# Patient Record
Sex: Male | Born: 1965 | Race: Black or African American | Hispanic: No | Smoking: Never smoker
Health system: Southern US, Community
[De-identification: ages and names within clinical notes are randomized; demographics above are authoritative.]

## PROBLEM LIST (undated history)

## (undated) DIAGNOSIS — K760 Fatty (change of) liver, not elsewhere classified: Secondary | ICD-10-CM

## (undated) DIAGNOSIS — J9611 Chronic respiratory failure with hypoxia: Secondary | ICD-10-CM

## (undated) DIAGNOSIS — IMO0001 Reserved for inherently not codable concepts without codable children: Secondary | ICD-10-CM

## (undated) DIAGNOSIS — G629 Polyneuropathy, unspecified: Secondary | ICD-10-CM

## (undated) DIAGNOSIS — Z9989 Dependence on other enabling machines and devices: Secondary | ICD-10-CM

## (undated) DIAGNOSIS — I5032 Chronic diastolic (congestive) heart failure: Secondary | ICD-10-CM

## (undated) DIAGNOSIS — R0902 Hypoxemia: Secondary | ICD-10-CM

## (undated) DIAGNOSIS — R609 Edema, unspecified: Secondary | ICD-10-CM

## (undated) DIAGNOSIS — K219 Gastro-esophageal reflux disease without esophagitis: Secondary | ICD-10-CM

## (undated) DIAGNOSIS — H9190 Unspecified hearing loss, unspecified ear: Secondary | ICD-10-CM

## (undated) DIAGNOSIS — L0390 Cellulitis, unspecified: Secondary | ICD-10-CM

## (undated) DIAGNOSIS — Z6841 Body Mass Index (BMI) 40.0 and over, adult: Secondary | ICD-10-CM

## (undated) DIAGNOSIS — I509 Heart failure, unspecified: Secondary | ICD-10-CM

## (undated) DIAGNOSIS — I10 Essential (primary) hypertension: Secondary | ICD-10-CM

## (undated) DIAGNOSIS — G4733 Obstructive sleep apnea (adult) (pediatric): Secondary | ICD-10-CM

## (undated) DIAGNOSIS — M1990 Unspecified osteoarthritis, unspecified site: Secondary | ICD-10-CM

## (undated) DIAGNOSIS — E119 Type 2 diabetes mellitus without complications: Secondary | ICD-10-CM

## (undated) DIAGNOSIS — I5020 Unspecified systolic (congestive) heart failure: Secondary | ICD-10-CM

## (undated) DIAGNOSIS — Z794 Long term (current) use of insulin: Secondary | ICD-10-CM

## (undated) DIAGNOSIS — I739 Peripheral vascular disease, unspecified: Secondary | ICD-10-CM

## (undated) DIAGNOSIS — R0601 Orthopnea: Secondary | ICD-10-CM

## (undated) HISTORY — DX: Obstructive sleep apnea (adult) (pediatric): G47.33

## (undated) HISTORY — DX: Fatty (change of) liver, not elsewhere classified: K76.0

## (undated) HISTORY — DX: Unspecified osteoarthritis, unspecified site: M19.90

## (undated) HISTORY — PX: EYE SURGERY: SHX253

## (undated) HISTORY — PX: NOSE SURGERY: SHX723

## (undated) HISTORY — DX: Unspecified systolic (congestive) heart failure: I50.20

## (undated) HISTORY — PX: HIP SURGERY: SHX245

## (undated) HISTORY — DX: Cellulitis, unspecified: L03.90

## (undated) HISTORY — DX: Chronic respiratory failure with hypoxia: J96.11

## (undated) HISTORY — DX: Dependence on other enabling machines and devices: Z99.89

## (undated) HISTORY — DX: Essential (primary) hypertension: I10

## (undated) HISTORY — PX: CHOLECYSTECTOMY: SHX55

## (undated) HISTORY — DX: Peripheral vascular disease, unspecified: I73.9

## (undated) HISTORY — DX: Morbid (severe) obesity due to excess calories: E66.01

---

## 2003-12-11 ENCOUNTER — Emergency Department: Payer: Self-pay | Admitting: Emergency Medicine

## 2004-10-21 ENCOUNTER — Emergency Department: Payer: Self-pay | Admitting: Emergency Medicine

## 2004-10-22 ENCOUNTER — Inpatient Hospital Stay: Payer: Self-pay | Admitting: Internal Medicine

## 2005-03-01 ENCOUNTER — Emergency Department: Payer: Self-pay | Admitting: Emergency Medicine

## 2005-09-28 ENCOUNTER — Emergency Department: Payer: Self-pay | Admitting: Internal Medicine

## 2005-10-03 ENCOUNTER — Emergency Department: Payer: Self-pay | Admitting: Internal Medicine

## 2005-10-03 ENCOUNTER — Ambulatory Visit: Payer: Self-pay | Admitting: Emergency Medicine

## 2007-02-01 HISTORY — PX: TOE AMPUTATION: SHX809

## 2007-04-01 ENCOUNTER — Emergency Department: Payer: Self-pay | Admitting: Emergency Medicine

## 2007-04-05 ENCOUNTER — Inpatient Hospital Stay: Payer: Self-pay | Admitting: Podiatry

## 2007-08-01 ENCOUNTER — Other Ambulatory Visit: Payer: Self-pay

## 2007-08-01 ENCOUNTER — Inpatient Hospital Stay: Payer: Self-pay | Admitting: Specialist

## 2007-08-20 ENCOUNTER — Ambulatory Visit: Payer: Self-pay | Admitting: General Surgery

## 2007-08-23 ENCOUNTER — Inpatient Hospital Stay: Payer: Self-pay | Admitting: Surgery

## 2008-02-01 HISTORY — PX: TOE AMPUTATION: SHX809

## 2008-03-11 ENCOUNTER — Inpatient Hospital Stay: Payer: Self-pay | Admitting: Internal Medicine

## 2008-04-17 ENCOUNTER — Ambulatory Visit: Payer: Self-pay | Admitting: Podiatry

## 2008-04-18 ENCOUNTER — Ambulatory Visit: Payer: Self-pay | Admitting: Podiatry

## 2008-11-28 ENCOUNTER — Encounter: Admission: RE | Admit: 2008-11-28 | Discharge: 2008-11-28 | Payer: Self-pay | Admitting: Orthopedic Surgery

## 2009-02-01 ENCOUNTER — Inpatient Hospital Stay: Payer: Self-pay | Admitting: Internal Medicine

## 2009-10-21 ENCOUNTER — Inpatient Hospital Stay: Payer: Self-pay | Admitting: Internal Medicine

## 2010-03-26 ENCOUNTER — Emergency Department: Payer: Self-pay | Admitting: Emergency Medicine

## 2010-10-30 ENCOUNTER — Emergency Department: Payer: Self-pay | Admitting: Emergency Medicine

## 2010-11-05 ENCOUNTER — Ambulatory Visit: Payer: Self-pay | Admitting: Unknown Physician Specialty

## 2011-03-08 ENCOUNTER — Inpatient Hospital Stay: Payer: Self-pay | Admitting: Student

## 2011-03-08 LAB — CBC WITH DIFFERENTIAL/PLATELET
Basophil #: 0 10*3/uL (ref 0.0–0.1)
Basophil %: 0.1 %
Eosinophil #: 0.3 10*3/uL (ref 0.0–0.7)
Eosinophil %: 1.7 %
HCT: 41.3 % (ref 40.0–52.0)
HGB: 13.8 g/dL (ref 13.0–18.0)
Lymphocyte #: 1.6 10*3/uL (ref 1.0–3.6)
Lymphocyte %: 9.3 %
MCH: 27.9 pg (ref 26.0–34.0)
MCHC: 33.3 g/dL (ref 32.0–36.0)
MCV: 84 fL (ref 80–100)
Monocyte #: 0.8 10*3/uL — ABNORMAL HIGH (ref 0.0–0.7)
Monocyte %: 4.7 %
Neutrophil #: 14.7 10*3/uL — ABNORMAL HIGH (ref 1.4–6.5)
Neutrophil %: 84.2 %
Platelet: 317 10*3/uL (ref 150–440)
RBC: 4.94 10*6/uL (ref 4.40–5.90)
RDW: 15.3 % — ABNORMAL HIGH (ref 11.5–14.5)
WBC: 17.4 10*3/uL — ABNORMAL HIGH (ref 3.8–10.6)

## 2011-03-08 LAB — COMPREHENSIVE METABOLIC PANEL
Albumin: 3.9 g/dL (ref 3.4–5.0)
Alkaline Phosphatase: 70 U/L (ref 50–136)
Anion Gap: 11 (ref 7–16)
BUN: 24 mg/dL — ABNORMAL HIGH (ref 7–18)
Bilirubin,Total: 0.5 mg/dL (ref 0.2–1.0)
Calcium, Total: 9.1 mg/dL (ref 8.5–10.1)
Chloride: 104 mmol/L (ref 98–107)
Co2: 29 mmol/L (ref 21–32)
Creatinine: 1.24 mg/dL (ref 0.60–1.30)
EGFR (African American): >60
EGFR (Non-African Amer.): >60
Glucose: 120 mg/dL — ABNORMAL HIGH (ref 65–99)
Osmolality: 292 (ref 275–301)
Potassium: 3.9 mmol/L (ref 3.5–5.1)
SGOT(AST): 19 U/L (ref 15–37)
SGPT (ALT): 29 U/L
Sodium: 144 mmol/L (ref 136–145)
Total Protein: 8.3 g/dL — ABNORMAL HIGH (ref 6.4–8.2)

## 2011-03-09 LAB — BASIC METABOLIC PANEL WITH GFR
Anion Gap: 12 (ref 7–16)
BUN: 25 mg/dL — ABNORMAL HIGH (ref 7–18)
Calcium, Total: 8.3 mg/dL — ABNORMAL LOW (ref 8.5–10.1)
Chloride: 102 mmol/L (ref 98–107)
Co2: 26 mmol/L (ref 21–32)
Creatinine: 1.23 mg/dL (ref 0.60–1.30)
EGFR (African American): >60
EGFR (Non-African Amer.): >60
Glucose: 262 mg/dL — ABNORMAL HIGH (ref 65–99)
Osmolality: 293 (ref 275–301)
Potassium: 4 mmol/L (ref 3.5–5.1)
Sodium: 140 mmol/L (ref 136–145)

## 2011-03-09 LAB — CBC WITH DIFFERENTIAL/PLATELET
Basophil #: 0 10*3/uL (ref 0.0–0.1)
Basophil %: 0.4 %
Eosinophil #: 0.3 10*3/uL (ref 0.0–0.7)
Eosinophil %: 2.5 %
HCT: 35.3 % — ABNORMAL LOW (ref 40.0–52.0)
HGB: 12.3 g/dL — ABNORMAL LOW (ref 13.0–18.0)
Lymphocyte #: 2.3 10*3/uL (ref 1.0–3.6)
Lymphocyte %: 20.4 %
MCH: 29.5 pg (ref 26.0–34.0)
MCHC: 34.7 g/dL (ref 32.0–36.0)
MCV: 85 fL (ref 80–100)
Monocyte #: 1.1 10*3/uL — ABNORMAL HIGH (ref 0.0–0.7)
Monocyte %: 10.2 %
Neutrophil #: 7.4 10*3/uL — ABNORMAL HIGH (ref 1.4–6.5)
Neutrophil %: 66.5 %
Platelet: 276 10*3/uL (ref 150–440)
RBC: 4.16 10*6/uL — ABNORMAL LOW (ref 4.40–5.90)
RDW: 14.2 % (ref 11.5–14.5)
WBC: 11.1 10*3/uL — ABNORMAL HIGH (ref 3.8–10.6)

## 2011-03-09 LAB — HEMOGLOBIN A1C: Hemoglobin A1C: 8.1 % — ABNORMAL HIGH (ref 4.2–6.3)

## 2011-03-09 LAB — VANCOMYCIN, TROUGH: Vancomycin, Trough: 13 ug/mL (ref 10–20)

## 2011-03-11 LAB — CBC WITH DIFFERENTIAL/PLATELET
Basophil #: 0 10*3/uL (ref 0.0–0.1)
Basophil %: 0.3 %
Eosinophil #: 0.4 10*3/uL (ref 0.0–0.7)
Eosinophil %: 4.4 %
HCT: 35.5 % — ABNORMAL LOW (ref 40.0–52.0)
HGB: 11.8 g/dL — ABNORMAL LOW (ref 13.0–18.0)
Lymphocyte #: 1.9 10*3/uL (ref 1.0–3.6)
Lymphocyte %: 20.6 %
MCH: 28.1 pg (ref 26.0–34.0)
MCHC: 33.2 g/dL (ref 32.0–36.0)
MCV: 85 fL (ref 80–100)
Monocyte #: 0.9 10*3/uL — ABNORMAL HIGH (ref 0.0–0.7)
Monocyte %: 9.4 %
Neutrophil #: 6.1 10*3/uL (ref 1.4–6.5)
Neutrophil %: 65.3 %
Platelet: 288 10*3/uL (ref 150–440)
RBC: 4.2 10*6/uL — ABNORMAL LOW (ref 4.40–5.90)
RDW: 15.1 % — ABNORMAL HIGH (ref 11.5–14.5)
WBC: 9.3 10*3/uL (ref 3.8–10.6)

## 2011-03-11 LAB — CREATININE, SERUM
Creatinine: 1.08 mg/dL (ref 0.60–1.30)
EGFR (African American): >60
EGFR (Non-African Amer.): >60

## 2011-03-11 LAB — VANCOMYCIN, TROUGH: Vancomycin, Trough: 19 ug/mL (ref 10–20)

## 2011-03-12 LAB — WOUND CULTURE

## 2011-03-13 LAB — CULTURE, BLOOD (SINGLE)

## 2011-05-02 ENCOUNTER — Ambulatory Visit: Payer: Self-pay | Admitting: Podiatry

## 2011-05-02 LAB — BASIC METABOLIC PANEL
Anion Gap: 10 (ref 7–16)
BUN: 19 mg/dL — ABNORMAL HIGH (ref 7–18)
Calcium, Total: 8.8 mg/dL (ref 8.5–10.1)
Chloride: 104 mmol/L (ref 98–107)
Co2: 28 mmol/L (ref 21–32)
Creatinine: 1.18 mg/dL (ref 0.60–1.30)
EGFR (African American): >60
EGFR (Non-African Amer.): >60
Glucose: 179 mg/dL — ABNORMAL HIGH (ref 65–99)
Osmolality: 290 (ref 275–301)
Potassium: 4.2 mmol/L (ref 3.5–5.1)
Sodium: 142 mmol/L (ref 136–145)

## 2011-05-02 LAB — CBC WITH DIFFERENTIAL/PLATELET
Basophil #: 0 10*3/uL (ref 0.0–0.1)
Basophil %: 0.4 %
Eosinophil #: 0.3 10*3/uL (ref 0.0–0.7)
Eosinophil %: 3.5 %
HCT: 38 % — ABNORMAL LOW (ref 40.0–52.0)
HGB: 12.6 g/dL — ABNORMAL LOW (ref 13.0–18.0)
Lymphocyte #: 2.1 10*3/uL (ref 1.0–3.6)
Lymphocyte %: 22.2 %
MCH: 28.2 pg (ref 26.0–34.0)
MCHC: 33.1 g/dL (ref 32.0–36.0)
MCV: 85 fL (ref 80–100)
Monocyte #: 0.6 10*3/uL (ref 0.0–0.7)
Monocyte %: 6.9 %
Neutrophil #: 6.2 10*3/uL (ref 1.4–6.5)
Neutrophil %: 67 %
Platelet: 315 10*3/uL (ref 150–440)
RBC: 4.45 10*6/uL (ref 4.40–5.90)
RDW: 15.1 % — ABNORMAL HIGH (ref 11.5–14.5)
WBC: 9.3 10*3/uL (ref 3.8–10.6)

## 2011-05-06 ENCOUNTER — Ambulatory Visit: Payer: Self-pay | Admitting: Podiatry

## 2011-05-09 LAB — PATHOLOGY REPORT

## 2011-05-12 ENCOUNTER — Ambulatory Visit: Payer: Self-pay | Admitting: Family Medicine

## 2011-05-20 ENCOUNTER — Other Ambulatory Visit: Payer: Self-pay | Admitting: Podiatry

## 2011-05-24 LAB — WOUND CULTURE

## 2011-10-10 ENCOUNTER — Ambulatory Visit: Payer: Self-pay | Admitting: Unknown Physician Specialty

## 2011-10-10 LAB — CREATININE, SERUM
Creatinine: 1.14 mg/dL (ref 0.60–1.30)
EGFR (African American): >60
EGFR (Non-African Amer.): >60

## 2011-10-20 ENCOUNTER — Ambulatory Visit: Payer: Self-pay | Admitting: Unknown Physician Specialty

## 2011-10-26 ENCOUNTER — Ambulatory Visit: Payer: Self-pay | Admitting: Unknown Physician Specialty

## 2013-06-24 ENCOUNTER — Emergency Department: Payer: Self-pay | Admitting: Emergency Medicine

## 2013-06-24 IMAGING — CT CT PARANASAL SINUSES W/O CM
1 series · 15 of 30 positions shown, 19 images · non-contrast
Comparison: none

REASON FOR EXAM: Abn MRI Rt Frontal Sinus Bone
COMMENTS:

[Series 2: osteo · axial · 0.40mm/px · z∈[-188,+4]mm · 15 of 208 slices shown, 19 images]
[im 8/208  brain]
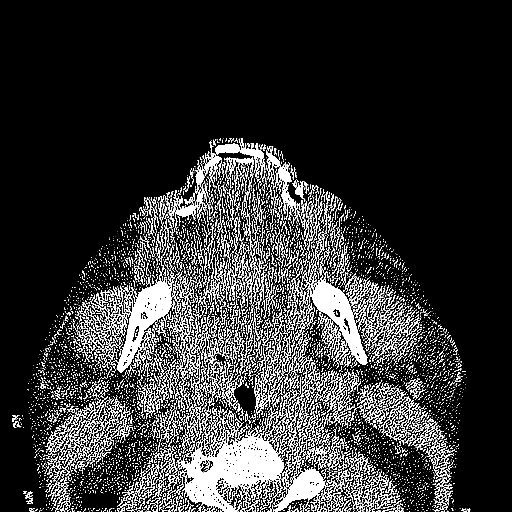
[im 8/208  bone]
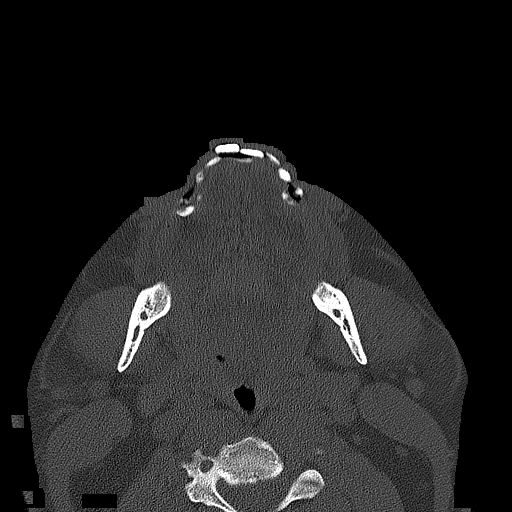
[im 22/208  bone]
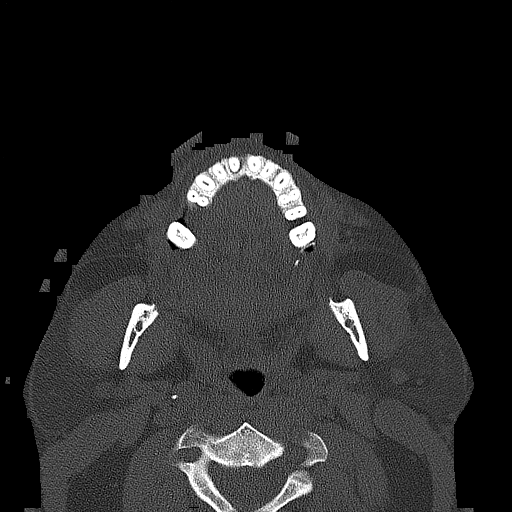
[im 36/208  bone]
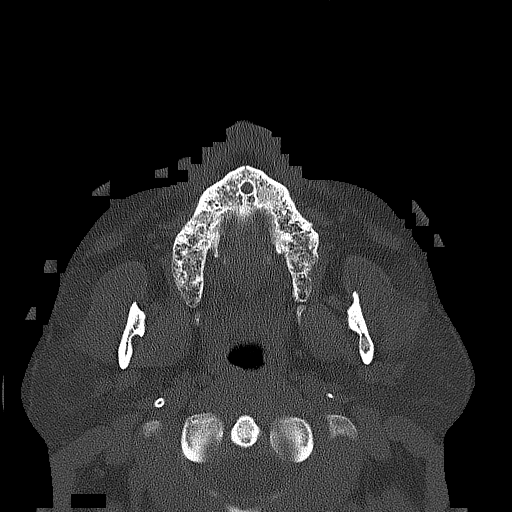
[im 50/208  bone]
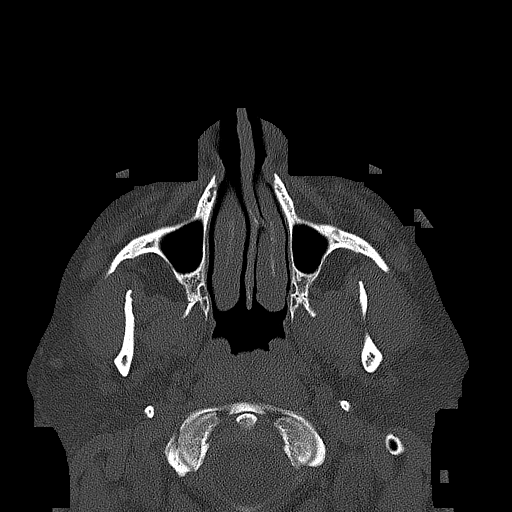
[im 65/208  brain]
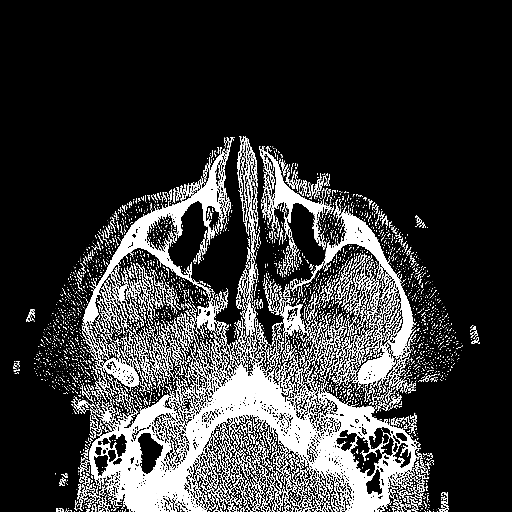
[im 65/208  bone]
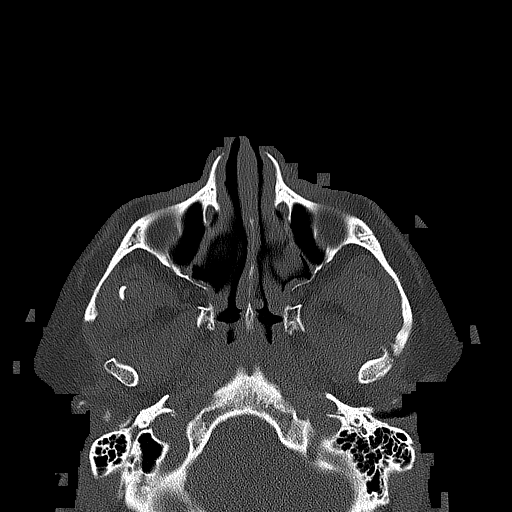
[im 79/208  bone]
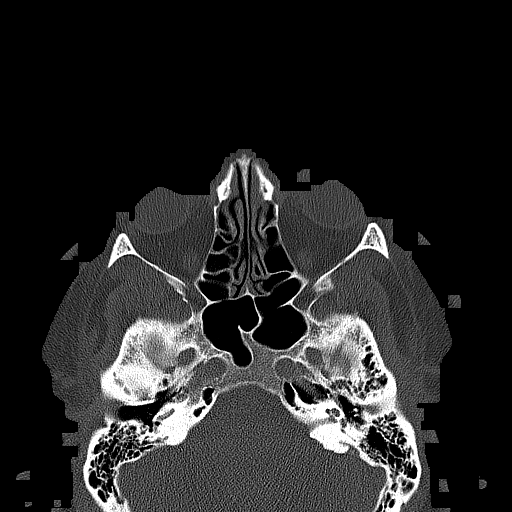
[im 93/208  bone]
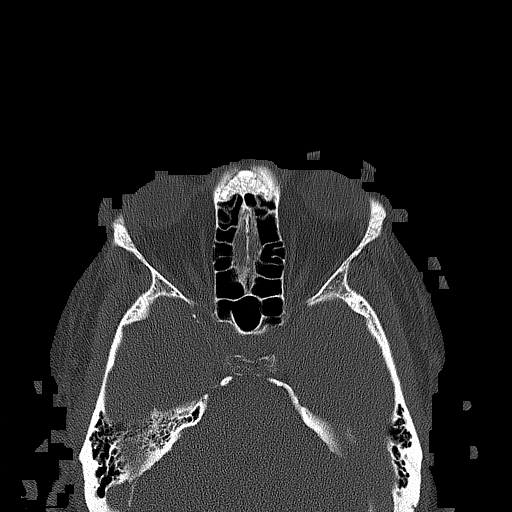
[im 108/208  bone]
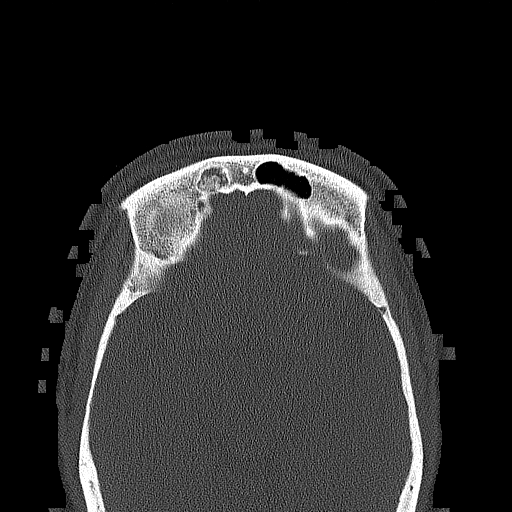
[im 115/208  brain]
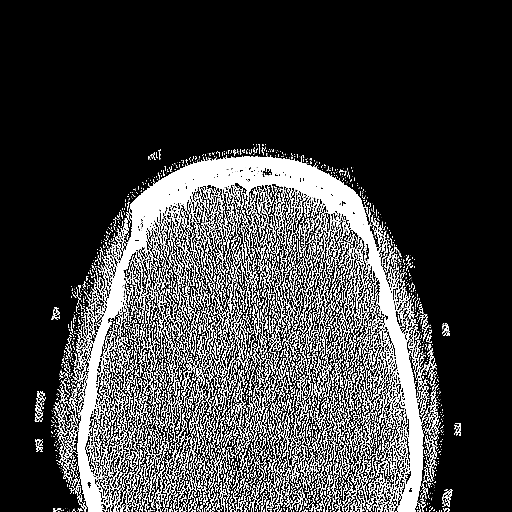
[im 115/208  bone]
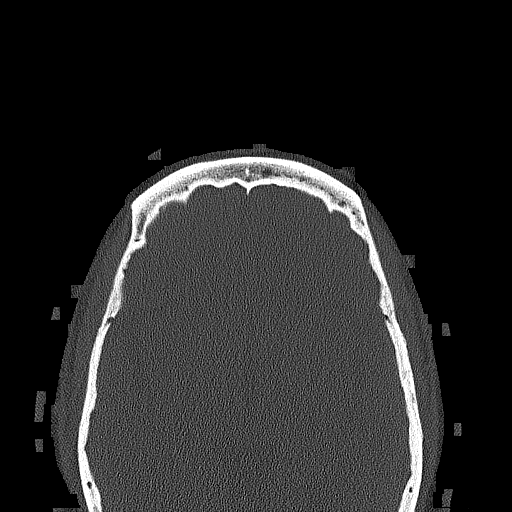
[im 129/208  bone]
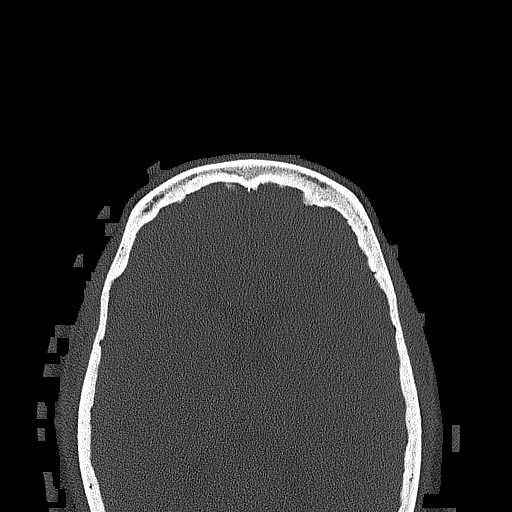
[im 143/208  bone]
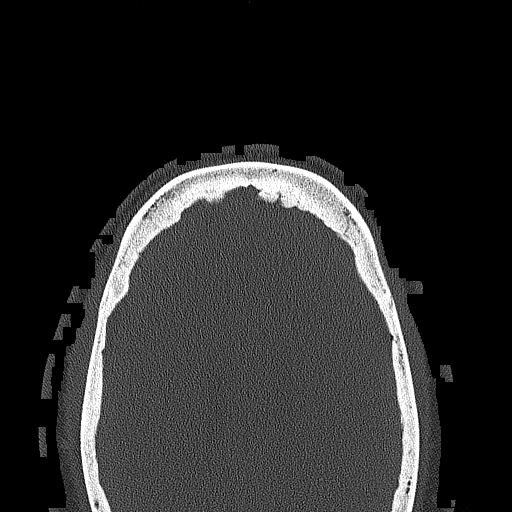
[im 158/208  bone]
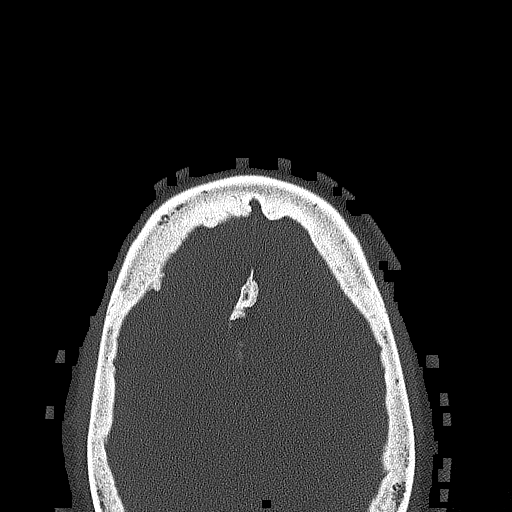
[im 172/208  brain]
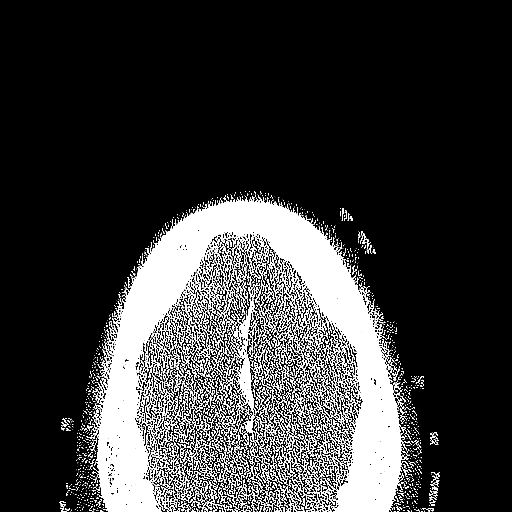
[im 172/208  bone]
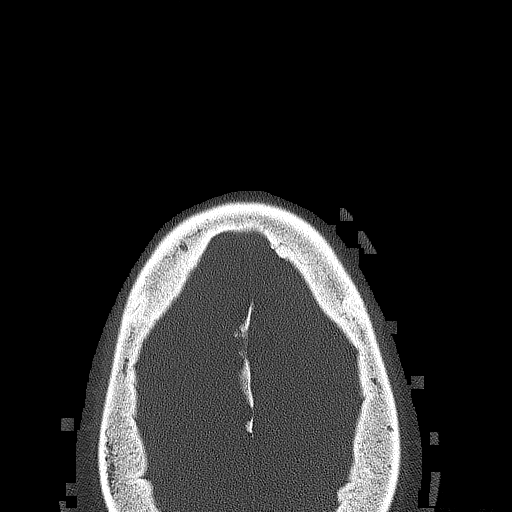
[im 186/208  bone]
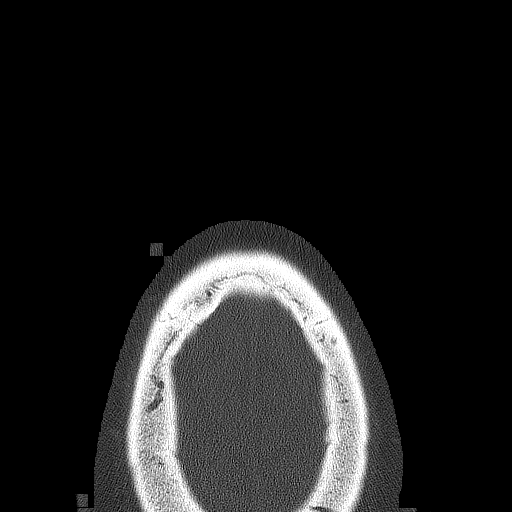
[im 200/208  bone]
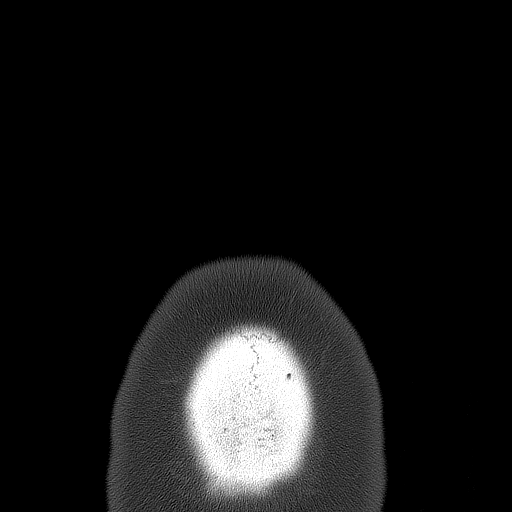

[15 of 30 positions shown; findings below may reference images not displayed]

PROCEDURE:     KCT - KCT SINUSES WITHOUT CONTRAST  - October 26, 2011 [DATE]

RESULT:     Sinus CT is reconstructed at bone window settings at 2.0 mm
slice thickness in the axial and coronal planes. Comparison is made to the
previous MRI from 20 October, 2011.

There is an abnormal appearance in the right frontal sinus serpiginous
calcific density and opacification. There is thinning and possible
disruption of the posterior right frontal sinus cortex into the CSF space.
The appearance of the sinus raises the possibility of chronic sinusitis and
inspissated mucus. Correlate clinically. Surgical consultation is
recommended. There is a normally aerated left frontal sinus. The ethmoid
sinuses appear clear bilaterally. The sphenoid and maxillary sinuses show no
opacification or mucosal thickening. The nasal septum approximates midline.
The included mastoids sinuses and middle ear structures appear to be grossly
normal. Calcification is seen along the interhemispheric fissure. Zygomatic
arches, included mandible and maxilla are otherwise unremarkable.
IMPRESSION: 1. Findings concerning for chronic sinusitis with inspissated mucus in the
right frontal sinus with possible erosion of the posterior aspect of the
right frontal sinus wall into the CSF space. This is best appreciated in the
region of image #108 to #110.

[REDACTED]

## 2013-07-09 DIAGNOSIS — E785 Hyperlipidemia, unspecified: Secondary | ICD-10-CM | POA: Insufficient documentation

## 2013-10-15 ENCOUNTER — Ambulatory Visit: Payer: Self-pay | Admitting: Podiatry

## 2013-11-05 ENCOUNTER — Encounter: Payer: Self-pay | Admitting: Podiatry

## 2013-11-05 ENCOUNTER — Ambulatory Visit (INDEPENDENT_AMBULATORY_CARE_PROVIDER_SITE_OTHER): Payer: Medicare PPO | Admitting: Podiatry

## 2013-11-05 ENCOUNTER — Ambulatory Visit: Payer: Self-pay | Admitting: Podiatry

## 2013-11-05 VITALS — BP 127/70 | HR 76 | Resp 16 | Ht 74.0 in | Wt 354.0 lb

## 2013-11-05 DIAGNOSIS — Q828 Other specified congenital malformations of skin: Secondary | ICD-10-CM

## 2013-11-05 DIAGNOSIS — B351 Tinea unguium: Secondary | ICD-10-CM

## 2013-11-05 DIAGNOSIS — M79676 Pain in unspecified toe(s): Secondary | ICD-10-CM

## 2013-11-05 DIAGNOSIS — E118 Type 2 diabetes mellitus with unspecified complications: Secondary | ICD-10-CM

## 2013-11-05 NOTE — Progress Notes (Signed)
   Subjective:    [name], male    DOB: Jun 03, 1965, 48 y.o.   MRN: [id number]  HPI Comments: Raymond [name] presents to the office today for diabetic risk assessment and painful elongated nails. He states he is been diabetic for approximately 20 years. 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 States his feet are numb chronically. Denies any claudication symptoms at this time. Denies any redness or drainage from the nail sites. No other complaints at this time. Patient was previously seen Dr. Cleda Mccreedy at [facility]. Patient is unsure of his blood sugar regularly.     Review of Systems  HENT: Positive for hearing loss, sinus pressure and sore throat.   Eyes: Positive for redness and visual disturbance.  Endocrine: Positive for heat intolerance.  Musculoskeletal:       Difficulty walking  Neurological: Positive for numbness.  All other systems reviewed and are negative.      Objective:   Physical Exam AAO x3, NAD DP pulses palpable, PT pulses decreased. CRT < 3 sec Absent protective sensation, altered vibratory sensation. Nails hypertrophic, dystrophic, elongated, brittle, yellow-brown discoloration x7 Thick hyperkeratotic lesion at the medial aspect of the left first metatarsal head with evidence of underlying previous bleeding. Upon debridement there is a small superficial pinpoint ulceration without any probing, undermining, tunneling. There is no drainage from the site. No clinical signs of infection. Previous bilateral hallux indications and left second digit indication. Interdigital maceration right foot MMT 5/5, ROM WNL No calf pain, swelling, warmth.     Assessment & Plan:  48 year old male with symptomatic onychomycosis, thick hyperkeratotic lesion left medial first metatarsal head with superficial underlying opening -There is treatment options discussed the patient including alternatives, risks,  complications. -Nail sharply debrided x7 without complications -Hyperkeratotic lesion sharply debrided without consultation. Underlying superficial, pinpoint ulceration or clinical signs of infection. Continued antibiotic ointment and a Band-Aid over the site. -Discussed the importance of daily foot inspection. -Followup in 3 months or sooner if any problems are to arise. Monitor the left foot site. If there is no healing in the next 2 weeks or any clinical signs of infection and directed to call the office sooner. Call the office with any questions, concerns, change in symptoms.

## 2014-02-04 ENCOUNTER — Ambulatory Visit: Payer: Medicare PPO | Admitting: Podiatry

## 2014-03-30 ENCOUNTER — Inpatient Hospital Stay: Payer: Self-pay | Admitting: Internal Medicine

## 2014-04-22 ENCOUNTER — Ambulatory Visit: Payer: Self-pay | Admitting: Family

## 2014-04-28 ENCOUNTER — Encounter: Payer: Self-pay | Admitting: Internal Medicine

## 2014-04-28 ENCOUNTER — Ambulatory Visit (INDEPENDENT_AMBULATORY_CARE_PROVIDER_SITE_OTHER): Payer: Medicare PPO | Admitting: Internal Medicine

## 2014-04-28 VITALS — BP 130/72 | HR 74 | Temp 97.8°F | Ht 74.0 in | Wt 370.0 lb

## 2014-04-28 DIAGNOSIS — R0902 Hypoxemia: Secondary | ICD-10-CM

## 2014-04-28 DIAGNOSIS — G4733 Obstructive sleep apnea (adult) (pediatric): Secondary | ICD-10-CM

## 2014-04-28 DIAGNOSIS — R0600 Dyspnea, unspecified: Secondary | ICD-10-CM | POA: Diagnosis not present

## 2014-04-28 DIAGNOSIS — Z9981 Dependence on supplemental oxygen: Secondary | ICD-10-CM

## 2014-04-28 DIAGNOSIS — G479 Sleep disorder, unspecified: Secondary | ICD-10-CM | POA: Diagnosis not present

## 2014-04-28 NOTE — Progress Notes (Signed)
Date: 04/28/2014  MRN# [id number] Raymond [name] 08-21-1965  Referring Physician: Saunders Revel, MD [facility])  Raymond [name] is a 49 y.o. old male seen in consultation for hospital follow up OSA possible chronic respiratory failure secondary to heart failure (diastolic)  CC:  Chief Complaint  Patient presents with  . Hospitalization Follow-up    Pt is here f/u fluid on lungs. Pt came home on 2l 02 CONT. Pt states he is doing well no sob. Pt denies chest tightness, cough or wheezing.    HPI:  Patient is a pleasant 49 year old male presents today for hospital followup visit of chronic respiratory failure secondary to obesity, obstructive sleep apnea, systolic CHF. Patient's past medical history of morbid obesity, diabetes, hypertension, systolic CHF (ejection fraction 50-55%). For full hospital admission details see below. Briefly, patient had one week of shortness of breath and presented to the hospital, noted to have desaturations in the mid 80s, was placed on BiPAP with moderate improvement, and discharged on a trilogy machine. Patient states that he has a diagnosis of sleep apnea since 2010, was initially on CPAP, and 2013 had arepeat sleep study, was placed on BiPAP 27-21, but has not been wearing his BiPAP machine on a nightly basis (maybe one time a). During hospitalization he was suspected to have untreated sleep apnea along with obesity hypoventilation syndrome.  Today he presents for followup of sleep apnea and optimization of his sleep apnea along with his chronic hypoxic respiratory failure. During his visit today on room air he was noted to be satting anywhere between 88 and 91%. He recently followed up with the CHF clinic and is currently on twice a day diuretics.  However, Per review of his medication list he is currently on furosemide 40 mg daily Patient states that he does have occasional swelling, he is able to walk but does have to wear 2 L of oxygen, he has not  try climbing stairs recently, but thinks that he would become severely short of breath if he does try to climb stairs.   [facility] DATE OF ADMISSION:  03/30/2014 DATE OF DISCHARGE:  04/08/2014  PRIMARY CARE PHYSICIAN: Myrle Sheng. Jimmye Norman, MD at the [facility].  FINAL DIAGNOSES:  1.  Acute and now chronic respiratory failure, hypoxia and hypercarbic.  2.  Suspected obesity hypoventilation syndrome, possible chronic obstructive pulmonary with hypercarbia sleep apnea.  3.  Acute diastolic congestive heart failure.  4.  Hypertension.  5.  Morbid obesity.  6.  Diabetes.  7.  Gastroesophageal reflux disease without esophagitis.   MEDICATIONS ON DISCHARGE: Include gabapentin 300 mg 3 times a day, aspirin 81 mg daily, Coreg 6.25 mg twice a day, Protonix 40 mg daily, Humalog 60 units 3 times a day, Levemir 70 units subcutaneous injection twice a day, enalapril decreased to 2.5 mg daily, furosemide increased to 40 mg twice a day, potassium chloride 20 mEq daily. Stop taking Glucophage.  HOME OXYGEN: Yes, 2 liters nasal cannula with Trilogy machine at night if that gets approved. If it does not get approved then the BiPAP at night.   DIET: Low sodium diet, carbohydrate-controlled diet, regular consistency.   ACTIVITY: As tolerated.   FOLLOWUP: With Dr. Stevenson Clinch in 3-4 weeks; CHF Clinic in 1-2 weeks with Dr. Tomasita Morrow.   HOSPITAL COURSE: The patient was admitted 03/30/2014, discharged 04/08/2014, came in with shortness of breath for 1 week, admitted with acute respiratory failure, hypoxia, acute congestive heart failure. The patient was given IV Lasix.   Laboratory  and radiological data during the hospital course included an EKG that showed normal sinus rhythm, cannot rule out inferior infarct. Urinalysis 1+ blood, greater than 500 mg/dL of glucose. Chest x-ray showed bibasilar atelectasis, mild vascular congestion without pulmonary edema or pleural effusion. BNP 1301, D-dimer 1006.  White blood cell count 10.8, hemoglobin 14.5, hematocrit 46.8, platelet count of 270,000. Glucose 254, BUN 13, creatinine 1.12, sodium 143, potassium 4.3, chloride 101, CO2 of 37, calcium 8.8.   CT scan of the chest to rule out pulmonary embolism showed no evidence of significant pulmonary embolism, patchy bilateral airspace opacities likely reflect atelectasis, minimal interstitial prominence of uncertain significance would correlate for evidence of mild interstitial edema. TSH of 0.73. Hemoglobin A1c of 9.6. LDL 23, HDL 49, triglycerides 62. Echocardiogram showed EF of 50% to 55%, elevated mean left atrial pressure, impaired relaxation of left ventricular diastolic filling, severely dilated left atrium, mild mitral valve regurgitation, moderately increased left ventricular posterior wall thickness. Ultrasound of the lower extremities, no evidence of DVT.   Creatinine upon discharge 1.09, sodium 141, potassium 3.9, chloride 102, CO2 of 28, calcium 8.6. The last ABG on March 4 showed a pH of 7.27, pCO2 of 58, pO2 of 96. That was on 100% high flow nasal cannula. Bicarbonate 26.6, oxygen saturation 96.1.   HOSPITAL COURSE PER PROBLEM LIST:  1.  For the patient's acute now chronic respiratory failure hypoxia and hypercarbic, the patient was on 100% high flow nasal cannula for numerous days. In speaking with Dr. Mortimer Fries we just took him off since he was not complaining of anything and he did well once coming off the high flow nasal cannula. He did qualify for home oxygen, when he walked around his pulse oximetry did drop down to 83, so he was discharged home on 2 liters nasal cannula. I think this was mostly CHF but combination of obesity-hypoventilation syndrome, possible COPD with hypercarbia and sleep apnea contributing.  2.  Suspected obesity-hypoventilation syndrome. Possible COPD with hypercarbia and sleep apnea. We did try to get a Trilogy approved. If this gets approved, he will be using the Trilogy at  night. If it does not get approved, he will go back on his BiPAP.  3.  Acute diastolic congestive heart failure. The patient was diuresed aggressively with IV Lasix and also IV Diamox. The patient's CO2 did come down. The patient did respond with good diuresis. The patient's weight upon discharge is 366 with a BMI of 47.0. The patient was urinating well. I did have to hold the Lasix at one point where his creatinine went up to 1.66 and had a Lasix holiday. I put him back on oral Lasix. Creatinine remained stable.  4.  Hypertension. Blood pressure on the lower side. I cut back on his enalapril. Continue the Coreg dose.  5.  Morbid obesity with a BMI of 47. Weight loss is recommended. I did speak to him about gastric banding procedure. If he would want to be a candidate for that, he can follow up with his primary care physician and consider bariatric surgery referral as outpatient.  6.  Diabetes. The patient was seen in consultation by Dr. Gabriel Carina. When the patient was on steroids, the sugars were very high requiring insulin drip in the ICU, now back on his usual medications.  7.  Gastroesophageal reflux disease without esophagitis, back on Protonix.  PMHX:   Past Medical History  Diagnosis Date  . Diabetes   . Hypertension   . Morbid obesity   .  Systolic heart failure     Preserved EF 50-55%  . Chronic respiratory failure with hypoxia     2 L [place] continuously   Surgical Hx:  Past Surgical History  Procedure Laterality Date  . Eye surgery Left     bilat laser  . 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 Toe amputation Right 2009  . Cholecystectomy    . Hip surgery Right     8 th grade pins   Family Hx:  Family History  Problem Relation Age of Onset  . Hypertension Mother   . Stroke Father   . Hypertension Father   . Diabetes Father    Social Hx:   History  Substance Use Topics  . Smoking status: Never Smoker   . Smokeless tobacco: Not on file  . Alcohol Use: Yes   Medication:    Current Outpatient Rx  Name  Route  Sig  Dispense  Refill  . aspirin EC 81 MG tablet   Oral   Take 81 mg by mouth daily.         . carvedilol (COREG) 12.5 MG tablet   Oral   Take 12.5 mg by mouth 2 (two) times daily with a meal.         . enalapril (VASOTEC) 20 MG tablet   Oral   Take 40 mg by mouth 2 (two) times daily.          . furosemide (LASIX) 40 MG tablet   Oral   Take 40 mg by mouth daily.         Marland Kitchen gabapentin (NEURONTIN) 300 MG capsule   Oral   Take 300 mg by mouth 3 (three) times daily.         . metFORMIN (GLUCOPHAGE) 1000 MG tablet   Oral   Take 1,000 mg by mouth 2 (two) times daily with a meal.         . pantoprazole (PROTONIX) 40 MG tablet   Oral   Take 40 mg by mouth daily.         . potassium chloride (MICRO-K) 10 MEQ CR capsule   Oral   Take 10 mEq by mouth daily.             Allergies:  Review of patient's allergies indicates no known allergies.  Review of Systems: Gen:  Denies  fever, sweats, chills HEENT: Denies blurred vision, double vision, ear pain, eye pain, hearing loss, nose bleeds, sore throat Cvc:  No dizziness, chest pain or heaviness Resp:   Short of breath with exertion, no cough, no wheeze. Gi: Denies swallowing difficulty, stomach pain, nausea or vomiting, diarrhea, constipation, bowel incontinence Gu:  Denies bladder incontinence, burning urine Ext:   No Joint pain, stiffness or swelling Skin: No skin rash, easy bruising or bleeding or hives Endoc:  No polyuria, polydipsia , polyphagia or weight change Psych: No depression, insomnia or hallucinations  Other:  All other systems negative  Physical Examination:   VS: BP 130/72 mmHg  Pulse 74  Temp(Src) 97.8 F (36.6 C) (Oral)  Ht 6\' 2"  (1.88 m)  Wt 370 lb (167.831 kg)  BMI 47.49 kg/m2  SpO2 91%  General Appearance: No distress  Neuro:without focal findings, mental status, speech normal, alert and oriented, cranial nerves 2-12 intact, reflexes normal and  symmetric, sensation grossly normal  HEENT: PERRLA, EOM intact, no ptosis, no other lesions noticed; Mallampati 4 Pulmonary: dec breath sounds at the bases, no wheezes\crackles CardiovascularNormal S1,S2.  No m/r/g.  Abdominal aorta pulsation normal.    Abdomen: Benign, Soft, non-tender, No masses, hepatosplenomegaly, No lymphadenopathy Renal:  No costovertebral tenderness  GU:  No performed at this time. Endoc: No evident thyromegaly, no signs of acromegaly or Cushing features Skin:   warm, no rashes, no ecchymosis  Extremities: normal, no cyanosis, clubbing,warm with normal capillary refill. Other findings:+1 edema to mid legs    Rad results: (The following images and results were reviewed by Dr. Stevenson Clinch). 03/30/14 COMPARISON:  Chest radiograph performed earlier today at 6:23 p.m., and CTA of the chest performed 10/21/2009  FINDINGS: There is no evidence of significant pulmonary embolus. Evaluation for pulmonary embolus is mildly suboptimal due to motion artifact and the patient's habitus.  Patchy bilateral airspace opacities likely reflect atelectasis. There is mild interstitial prominence, of uncertain significance. Would correlate for evidence of mild interstitial edema. There is no evidence of pleural effusion or pneumothorax. No masses are identified; no abnormal focal contrast enhancement is seen.  The mediastinum is unremarkable in appearance. No mediastinal lymphadenopathy is seen. No pericardial effusion is identified. The great vessels are grossly unremarkable. No axillary lymphadenopathy is seen. The visualized portions of the thyroid gland are unremarkable in appearance.  The visualized portions of the liver and spleen are unremarkable.  No acute osseous abnormalities are seen. Review of the MIP images confirms the above findings.   IMPRESSION: 1. No evidence of significant pulmonary embolus. 2. Patchy bilateral airspace opacities likely reflect atelectasis. Mild  interstitial prominence, of uncertain significance. Would correlate for evidence of mild interstitial edema.  ECHO 03/2014 Summary:  1. Left ventricular ejection fraction, by visual estimation, is 50 to  55%.  2. Mid inferoseptal segment is abnormal.  3. Elevated mean left atrial pressure.  4. Impaired relaxation pattern of LV diastolic filling.  5. Normal right ventricular size and systolic function.  6. Severely dilated left atrium.  7. The right atrium is normal in size and structure.  8. Mild mitral valve regurgitation.  9. Mild aortic valve sclerosis without stenosis. 10. Moderately increased left ventricular posterior wall thickness.    Sleep study 05/12/2011: AHI of 1234, RDI of 134, BiPAP titrated to 27/21 cm of water  Assessment and Plan: 48 year old male with past medical history of morbid obesity, obstructive sleep apnea on BiPAP, systolic heart failure (preserved EF), chronic hypoxic respiratory failure secondary to heart failure/OSA/OHS, seen in clinic today for hospital follow-up of OSA/OHS optimization. Dyspnea Multifactorial: Obstructive sleep apnea, obesity, untreated OSA, systolic heart failure  Optimization of his heart failure anasarca sleep apnea will greatly improve his level of dyspnea. Weight loss, healthy diet, and exercise, will address deconditioning issues that could also be adding to his dyspnea.  Plan: -Pulmonary function testing, 6 minute walk test at next visit - healthy diet, weight loss, exercise as tolerated. -CHF optimization-currently following the CHF clinic, plan for Cardiology follow up   OSA treated with BiPAP Review sleep on BiPAP 27/21 cm of water. Upon discharge from hospital and March 2015 he was placed on trilogy machine (NIPPV) He has been noncompliant with his BiPAP machine since 2013, will further evaluate with a new split night study to determine if he needs adjustment of his pressures to OSA optimization.   Plan: -Continue  using trilogy machine as directed, on a nightly basis -Split-night study prior to next visit -Weight loss, exercise as tolerated.   Hypoxemia requiring supplemental oxygen Multifactorial: Systolic CHF, obesity, obstructive sleep apnea  During his visit today he was noted to drop  down to 80% on room air, he is recently out of his hospitalization. Will plan for another 6 minute walk test at his followup visit.   Plan: -Pulmonary function testing and 6 minute walk test prior to next visit -Continue at 2 L [place] continuously -Split-night study.   Morbid obesity OBESITY  Wt: 370 lbs BMI: 47.49 Discussed importance of weight reduction.  Educated regarding limitation of  intake of greasy/fried foods.  Instructed on benefit of  a low-impact exercise program, starting slowly.  Discussed benefits of 30-45 minutes of some form of exercise daily as well as benefit of supervised exercise program.        Updated Medication List Outpatient Encounter Prescriptions as of 04/28/2014  Medication Sig  . aspirin EC 81 MG tablet Take 81 mg by mouth daily.  . carvedilol (COREG) 12.5 MG tablet Take 12.5 mg by mouth 2 (two) times daily with a meal.  . enalapril (VASOTEC) 20 MG tablet Take 40 mg by mouth 2 (two) times daily.   . furosemide (LASIX) 40 MG tablet Take 40 mg by mouth daily.  Marland Kitchen gabapentin (NEURONTIN) 300 MG capsule Take 300 mg by mouth 3 (three) times daily.  . metFORMIN (GLUCOPHAGE) 1000 MG tablet Take 1,000 mg by mouth 2 (two) times daily with a meal.  . pantoprazole (PROTONIX) 40 MG tablet Take 40 mg by mouth daily.  . potassium chloride (MICRO-K) 10 MEQ CR capsule Take 10 mEq by mouth daily.  . [DISCONTINUED] POTASSIUM PO Take by mouth daily.    Orders for this visit: No orders of the defined types were placed in this encounter.     Thank  you for the consultation and for allowing [facility] to assist in the care of your patient. Our recommendations are noted  above.  Please contact us if we can be of further service.   Vilinda Boehringer, MD [facility] Office Number: [phone]

## 2014-04-28 NOTE — Assessment & Plan Note (Addendum)
OBESITY  Wt: 370 lbs BMI: 47.49 Discussed importance of weight reduction.  Educated regarding limitation of  intake of greasy/fried foods.  Instructed on benefit of  a low-impact exercise program, starting slowly.  Discussed benefits of 30-45 minutes of some form of exercise daily as well as benefit of supervised exercise program.

## 2014-04-28 NOTE — Patient Instructions (Addendum)
Follow up with Dr. Stevenson Clinch in 3-4 weeks - pulmonary function testing and 6 minute walk test prior to follow up - split night study prior to follow up (take your triology machine with you) - weight loss, healthy and exercise.  - Cardiology follow up.

## 2014-04-28 NOTE — Assessment & Plan Note (Signed)
Multifactorial: Systolic CHF, obesity, obstructive sleep apnea  During his visit today he was noted to drop down to 80% on room air, he is recently out of his hospitalization. Will plan for another 6 minute walk test at his followup visit.   Plan: -Pulmonary function testing and 6 minute walk test prior to next visit -Continue at 2 L [place] continuously -Split-night study.

## 2014-04-28 NOTE — Assessment & Plan Note (Addendum)
Multifactorial: Obstructive sleep apnea, obesity, untreated OSA, systolic heart failure  Optimization of his heart failure anasarca sleep apnea will greatly improve his level of dyspnea. Weight loss, healthy diet, and exercise, will address deconditioning issues that could also be adding to his dyspnea.  Plan: -Pulmonary function testing, 6 minute walk test at next visit - healthy diet, weight loss, exercise as tolerated. -CHF optimization-currently following the CHF clinic, plan for Cardiology follow up

## 2014-04-28 NOTE — Assessment & Plan Note (Addendum)
Review sleep on BiPAP 27/21 cm of water. Upon discharge from hospital and March 2015 he was placed on trilogy machine (NIPPV) He has been noncompliant with his BiPAP machine since 2013, will further evaluate with a new split night study to determine if he needs adjustment of his pressures to OSA optimization.   Plan: -Continue using trilogy machine as directed, on a nightly basis -Split-night study prior to next visit -Weight loss, exercise as tolerated.

## 2014-04-30 ENCOUNTER — Other Ambulatory Visit: Payer: Self-pay | Admitting: Internal Medicine

## 2014-04-30 ENCOUNTER — Telehealth: Payer: Self-pay | Admitting: *Deleted

## 2014-04-30 DIAGNOSIS — R0600 Dyspnea, unspecified: Secondary | ICD-10-CM

## 2014-04-30 DIAGNOSIS — I509 Heart failure, unspecified: Secondary | ICD-10-CM

## 2014-04-30 NOTE — Telephone Encounter (Signed)
Called patient to let him know that in conjuction to CHF clinic Dr. Stevenson Clinch also wants him to establish with cardiology. Patient verbalizes understanding, I told him same office and check in.

## 2014-04-30 NOTE — Telephone Encounter (Signed)
-----   Message from Vilinda Boehringer, MD sent at 04/29/2014  5:29 PM EDT ----- Regarding: Cardiology follow up  Please make a referral for this patient to follow up with [facility] - for heart failure\dyspnea Also, inform the patient that inaddition to the CHF clinic, I want him to follow a cardiologist also.   Thank you

## 2014-05-06 ENCOUNTER — Encounter: Payer: Self-pay | Admitting: Cardiovascular Disease

## 2014-05-06 ENCOUNTER — Ambulatory Visit (INDEPENDENT_AMBULATORY_CARE_PROVIDER_SITE_OTHER): Payer: Medicare PPO | Admitting: Cardiovascular Disease

## 2014-05-06 VITALS — BP 142/64 | HR 71 | Ht 74.0 in | Wt 370.8 lb

## 2014-05-06 DIAGNOSIS — R079 Chest pain, unspecified: Secondary | ICD-10-CM | POA: Diagnosis not present

## 2014-05-06 DIAGNOSIS — I5032 Chronic diastolic (congestive) heart failure: Secondary | ICD-10-CM | POA: Diagnosis not present

## 2014-05-06 DIAGNOSIS — R0600 Dyspnea, unspecified: Secondary | ICD-10-CM

## 2014-05-06 DIAGNOSIS — Z9981 Dependence on supplemental oxygen: Secondary | ICD-10-CM

## 2014-05-06 DIAGNOSIS — G4733 Obstructive sleep apnea (adult) (pediatric): Secondary | ICD-10-CM | POA: Diagnosis not present

## 2014-05-06 DIAGNOSIS — R0902 Hypoxemia: Secondary | ICD-10-CM

## 2014-05-06 MED ORDER — FUROSEMIDE 40 MG PO TABS: 40.0000 mg | ORAL_TABLET | Freq: Two times a day (BID) | ORAL | Status: DC | PRN

## 2014-05-06 NOTE — Assessment & Plan Note (Signed)
Reports that sometimes he does well without his oxygen. Suspect that with continued gentle diuresis, achieving his goal weight low 360 pounds, perhaps might be able to wean oxygen

## 2014-05-06 NOTE — Assessment & Plan Note (Signed)
Mild chronic shortness of breath with exertion. Likely multifactorial including obesity, deconditioning. Likely has mild acute on chronic diastolic CHF. Ideal weight likely less than 365 pounds. He has follow-up with CHF clinic. May need periodic lab work to avoid overdiuresis

## 2014-05-06 NOTE — Patient Instructions (Signed)
You are doing well. No medication changes were made.  Goal weight 363 up to 368 pounds  For weight greater than 370 pounds, take extra furosemide after lunch  Please call us if you have new issues that need to be addressed before your next appt.  Your physician wants you to follow-up in: 6 months.  You will receive a reminder letter in the mail two months in advance. If you don't receive a letter, please call our office to schedule the follow-up appointment.

## 2014-05-06 NOTE — Assessment & Plan Note (Signed)
Reports he is tolerating his machine, sleeping well, good energy in the daytime. Followed by pulmonary

## 2014-05-06 NOTE — Progress Notes (Signed)
[name], male    DOB: 15-Apr-1965, 49 y.o.   MRN: [id number]  HPI Comments: Raymond [name] is a pleasant 49 year old gentleman with history of morbid obesity, obstructive sleep apnea, chronic diastolic CHF, obesity hypoventilation syndrome with recent hospital admission 03/30/2014 for acute on chronic respiratory failure, discharged on a Trilogy machine at night, seen by Dr. Stevenson Clinch in follow-up, who presents to the [facility] heart care office to establish care for his chronic diastolic CHF. He has been seen by CHF clinic. Also with a history of hypertension, diabetes  In follow-up today, he reports that he is doing relatively well. He does have some mild leg swelling, weight has been stable at 365 pounds up to 370 pounds. Today 368 pounds at home. He's been taking Lasix 40 mg daily. Denies any PND or orthopnea. He is trying to do better with his diet is an effort to lose weight. Enalapril recently increased by primary care for hypertension.  Recent lab work reviewed with him showing BUN and creatinine 17 and 1.0 respectively, potassium 4.3, normal LFTs and TSH  EKG on today's visit showing normal sinus rhythm with rate 71 bpm, no significant ST or T-wave changes  Other studies performed in the hospital, reviewed with him CT scan of the chest to rule out pulmonary embolism showed no evidence of significant pulmonary embolism, patchy bilateral airspace opacities likely reflect atelectasis, minimal interstitial prominence of uncertain significance would correlate for evidence of mild interstitial edema. TSH of 0.73. Hemoglobin A1c of 9.6. LDL 23, HDL 49, triglycerides 62.  Echocardiogram showed EF of 50% to 55%, elevated mean left atrial pressure, impaired relaxation of left ventricular diastolic filling, severely dilated left atrium, mild mitral valve regurgitation, moderately increased left ventricular posterior wall thickness. Ultrasound of the lower extremities, no  evidence of DVT.     No Known Allergies  Outpatient Encounter Prescriptions as of 05/06/2014  Medication Sig  . aspirin EC 81 MG tablet Take 81 mg by mouth daily.  . carvedilol (COREG) 12.5 MG tablet Take 12.5 mg by mouth 2 (two) times daily with a meal.  . enalapril (VASOTEC) 20 MG tablet Take 40 mg by mouth 2 (two) times daily.   . furosemide (LASIX) 40 MG tablet Take 1 tablet (40 mg total) by mouth 2 (two) times daily as needed.  . gabapentin (NEURONTIN) 300 MG capsule Take 300 mg by mouth 3 (three) times daily.  . metFORMIN (GLUCOPHAGE) 1000 MG tablet Take 1,000 mg by mouth 2 (two) times daily with a meal.  . pantoprazole (PROTONIX) 40 MG tablet Take 40 mg by mouth daily.  . potassium chloride (MICRO-K) 10 MEQ CR capsule Take 10 mEq by mouth daily.  . [DISCONTINUED] furosemide (LASIX) 40 MG tablet Take 40 mg by mouth daily.    Past Medical History  Diagnosis Date  . Diabetes   . Hypertension   . Morbid obesity   . Systolic heart failure     Preserved EF 50-55%  . Chronic respiratory failure with hypoxia     2 L [place] continuously  . OSA on CPAP   . Arthritis     hands    Past Surgical History  Procedure Laterality Date  . Eye surgery Left     bilat laser  . 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 Toe amputation Right 2009  . Cholecystectomy    . Hip surgery Right     8 th grade pins    Social History  reports that he has never smoked. He has never used smokeless tobacco. He reports that he does not drink alcohol or use illicit drugs.  Family History family history includes Diabetes in his father; Hypertension in his father and mother; Stroke in his father.   Review of Systems  Constitutional: Negative.   Respiratory: Negative.   Cardiovascular: Positive for leg swelling.  Gastrointestinal: Negative.   Musculoskeletal: Negative.   Neurological: Negative.   Hematological: Negative.   Psychiatric/Behavioral: Negative.   All other systems reviewed and are  negative.   BP 142/64 mmHg  Pulse 71  Ht 6\' 2"  (1.88 m)  Wt 370 lb 12 oz (168.171 kg)  BMI 47.58 kg/m2  Physical Exam  Constitutional: He is oriented to person, place, and time. He appears well-developed and well-nourished.  HENT:  Head: Normocephalic.  Nose: Nose normal.  Mouth/Throat: Oropharynx is clear and moist.  Eyes: Conjunctivae are normal. Pupils are equal, round, and reactive to light.  Neck: Normal range of motion. Neck supple. No JVD present.  Cardiovascular: Normal rate, regular rhythm, S1 normal, S2 normal, normal heart sounds and intact distal pulses.  Exam reveals no gallop and no friction rub.   No murmur heard. Pulmonary/Chest: Effort normal and breath sounds normal. No respiratory distress. He has no wheezes. He has no rales. He exhibits no tenderness.  Abdominal: Soft. Bowel sounds are normal. He exhibits no distension. There is no tenderness.  Musculoskeletal: Normal range of motion. He exhibits no edema or tenderness.  Lymphadenopathy:    He has no cervical adenopathy.  Neurological: He is alert and oriented to person, place, and time. Coordination normal.  Skin: Skin is warm and dry. No rash noted. No erythema.  Psychiatric: He has a normal mood and affect. His behavior is normal. Judgment and thought content normal.      Assessment and Plan   Nursing note and vitals reviewed.

## 2014-05-06 NOTE — Assessment & Plan Note (Signed)
Recommended he continue lasix 40 mg daily,  With goal weight 363 up to 368 pounds. 4 weight 1 and 370 pounds, recommended he take extra Lasix for total of 40 mg twice a day Recent lab work showing normal renal function, done at the end of March 2016

## 2014-05-06 NOTE — Assessment & Plan Note (Signed)
We have encouraged continued exercise, careful diet management in an effort to lose weight. He is trying to do better watching his diet.

## 2014-05-09 ENCOUNTER — Other Ambulatory Visit: Payer: Self-pay | Admitting: *Deleted

## 2014-05-09 DIAGNOSIS — R0600 Dyspnea, unspecified: Secondary | ICD-10-CM

## 2014-05-09 DIAGNOSIS — Z9981 Dependence on supplemental oxygen: Secondary | ICD-10-CM

## 2014-05-09 DIAGNOSIS — R0902 Hypoxemia: Secondary | ICD-10-CM

## 2014-05-21 ENCOUNTER — Ambulatory Visit: Admit: 2014-05-21 | Disposition: A | Discharge status: Home / Self Care | Payer: Self-pay | Admitting: Internal Medicine

## 2014-05-22 ENCOUNTER — Ambulatory Visit: Payer: Medicare PPO | Admitting: Cardiovascular Disease

## 2014-05-23 ENCOUNTER — Ambulatory Visit
Admit: 2014-05-23 | Disposition: A | Discharge status: Home / Self Care | Payer: Self-pay | Attending: Family | Admitting: Family

## 2014-05-25 NOTE — Discharge Summary (Signed)
PATIENT NAME:  Raymond [name], Raymond [name] MR#:  [id number] DATE OF BIRTH:  14-Feb-1965  DATE OF ADMISSION:  03/08/2011 DATE OF DISCHARGE:  03/11/2011  CONSULTANTS:  1. Albertine Patricia, MD - Podiatry.  2. Physical Therapy.   CHIEF COMPLAINT: Lower extremity increasing edema and warmth.  DISCHARGE DIAGNOSES:  1. Left lower extremity cellulitis. 2. Left plantar hallux ulcer. 3. Insulin-dependent diabetes. 4. Hypertension. 5. Peripheral vascular disease. 6. Obesity. 7. Obstructive sleep apnea, on CPAP. 8. Hepatic steatosis.   DISCHARGE MEDICATIONS:  1. Aspirin 81 mg daily.  2. Carvedilol 6.25 mg twice a day. 3. Protonix 40 mg extended-release daily.  4. Furosemide 40 mg daily.  5. Lantus 60 units twice a day. 6. Enalapril 10 mg daily.  7. Humalog 6 units subcutaneous three times daily before meals. 8. Glucophage 1000 mg twice a day. 9. Gabapentin 300 mg three times daily. 10. Keflex 500 mg p.o. every 12 hours for six days.   DIET: ADA diabetic, low sodium diet.   ACTIVITY: As tolerated.   DISCHARGE FOLLOWUP/INSTRUCTIONS: Please follow-up with your PCP and podiatrist early next week. If the leg wound gets worse, drains, or has increased odor, pain, or swelling, call your primary care physician right away.   HISTORY OF PRESENT ILLNESS: The patient is a 49 year old male with diabetes, hypertension, neuropathy, and obesity who presents with left greater than right lower extremity pain. On arrival, he had a temperature of 102.5, had an elevated blood pressure of 203/95, was tachycardic, and was admitted to the hospitalist service for sepsis and lower extremity cellulitis. He was started on vancomycin and Zosyn and Podiatry was consulted.   SIGNIFICANT LABS/IMAGING: Initial BUN 24, creatinine 1.24, sodium 144.  WBC 17.4 and WBC on discharge 9.3.   Blood cultures on arrival: No growth to date.   Wound culture: Staph aureus, pansensitive, as well as rare strep agalactiae.   MRI of left  foot: No evidence for osteomyelitis.   Ultrasound of bilateral lower extremities: No evidence for deep vein thrombosis.   HOSPITAL COURSE: The patient was admitted to the hospitalist service and started on Vancomycin and Zosyn. Podiatry was consulted, and the patient did have a small open ulcer beneath the left great toe. The patient has significant peripheral vascular disease and has had multiple digit amputations in the lower extremities. He did have cellulitis and cultures were sent. Podiatry did investigate the ulcer and did some local debridements. Blood cultures have been negative to date. The patient underwent lower extremity Dopplers, which were negative for deep vein thrombosis. MRI of the lower extremity ruled out osteomyelitis. Cultures did grow pan-sensitive staph aureus and he will be discharged with an additional six days of Keflex as he has had four days of IV antibiotics. The cellulitis has mostly resolved, and the patient has had no fever or significant leukocytosis. He is to follow up with Dr. Cleda Mccreedy, his podiatrist, early next week for follow-up. Per podiatry, the left hallux wound is stable and superficial. He is to continue wearing his diabetic shoes and boot for his left lower extremity wound, per podiatry.   While hospitalized, his blood pressure was stable and his diabetes was managed through his outpatient insulin regimen.     CODE STATUS: FULL CODE.   TOTAL TIME SPENT: 35 minutes.  ____________________________ Vivien Presto, MD sa:slb D: 03/11/2011 13:50:26 ET T: 03/11/2011 14:11:00 ET JOB#: [id number]  cc: Vivien Presto, MD, <Dictator> Sharlotte Alamo, DPM Myrle Sheng. Jimmye Norman, MD Vivien Presto MD ELECTRONICALLY SIGNED 03/25/2011 15:26

## 2014-05-25 NOTE — Op Note (Signed)
PATIENT NAME:  Raymond [name], Raymond [name] MR#:  [id number] DATE OF BIRTH:  [birth year]  DATE OF PROCEDURE:  05/06/2011  PREOPERATIVE DIAGNOSIS: Chronic ulceration left great toe.   POSTOPERATIVE DIAGNOSIS: Chronic ulceration left great toe.   PROCEDURE: Amputation left great toe.   SURGEON: Sharlotte Alamo, DPM  ANESTHESIA: Local MAC.   HEMOSTASIS: Pneumatic tourniquet left ankle, 250 mmHg.   ESTIMATED BLOOD LOSS: 25 mL.   PATHOLOGY: Left base of proximal phalanx great toe.   COMPLICATIONS: None apparent.   OPERATIVE INDICATIONS: This is a 49 year old male with previous amputation of a portion of the left great toe. He has continued to have a chronic ulceration over the last year beneath the left great toe and elects to have removal of the remainder of the great toe.   DESCRIPTION OF PROCEDURE: Patient was taken to the Operating Room and placed on the table in the supine position. Following satisfactory sedation, the left foot was anesthetized with 10 mL of 0.5% Sensorcaine plain. A pneumatic tourniquet was applied at the level of the left ankle and the foot was prepped and draped in the usual sterile fashion. The foot was exsanguinated and the tourniquet inflated to 300 mmHg.   Attention was then directed to the distal aspect of the left hallux stump where an incision was made through the previous incision around the medial and distal aspect of the stump of the toe. The incision was carried sharply down to the level of the bone where a periosteal dissection was performed of the base of the proximal phalanx. The bone was then disarticulated from the metatarsophalangeal joint and removed in toto. Good healthy bleeding was noted. The wound was flushed with copious amounts of sterile saline and closed using 4-0 nylon vertical mattress and simple interrupted sutures. Xeroform and a sterile bandage was applied followed by Kerlix and an Ace bandage. Patient tolerated the procedure and anesthesia well and was  transported to postanesthesia care unit with vital signs stable and in good condition.   ____________________________ Sharlotte Alamo, DPM tc:cms D: 05/06/2011 11:17:51 ET T: 05/06/2011 11:40:41 ET JOB#: [id number]  cc: Sharlotte Alamo, DPM, <Dictator> Mylee Falin DPM ELECTRONICALLY SIGNED 05/08/2011 8:05

## 2014-05-25 NOTE — Consult Note (Signed)
PATIENT NAME:  Raymond [name], Raymond [name] MR#:  [id number] DATE OF BIRTH:  [birth year]  DATE OF CONSULTATION:  03/08/2011  REFERRING PHYSICIAN:   CONSULTING PHYSICIAN:  Gerrit Heck. Joshaua Epple, DPM  HISTORY OF PRESENT ILLNESS: The patient is a 49 year old African American male admitted because of cellulitis to his left leg. He states he has had problems with infections before and his leg always swells and gets painful whenever he gets an infection according to him. He presented to the ER with a temperature of 102.5. He was hypertensive as well at 203/95 and tachycardic.   PAST MEDICAL HISTORY:  1. Insulin-dependent diabetes. 2. Hypertension. 3. Peripheral neuropathy. 4. Obesity. 5. Sleep apnea, on CPAP. 6. Hepatic steatosis.   PAST SURGICAL HISTORY:  1. Cholecystectomy. 2. Bilateral great toe amputation on the left and partial amputation at the IP joint. 3. Left second toe amputation, left foot. 4. I and D for removal of foreign body. 5. Right hip surgery as a child.   ALLERGIES: No known allergies.   CURRENT MEDICATIONS:  1. Metformin 100 mg b.i.d.  2. Aspirin 81 mg daily.  3. Humalog 60 units t.i.d.  4. Lantus 60 units b.i.d.  5. Lasix 40 mg daily. 6. Neurontin 300 mg t.i.d.   FAMILY HISTORY: Diabetes and hypertension.  SOCIAL HISTORY: Denies smoking or EtOH.   PHYSICAL EXAMINATION:  GENERAL: He is an alert, well oriented 49 year old African American male in no apparent distress. He is an obese male.   VITAL SIGNS: Most recent today at 2:50 this afternoon show a temperature of 98, pulse 97, respirations 18, blood pressure 155/65, pulse oximetry 90.   LOWER EXTREMITY EXAM:  VASCULATURE: DP pulses are hard to palpate. He does have some edema to both feet and legs. Some evidence of venostasis changes. Some redness and erythema to his legs but no gross amount of swelling to the areas at this timeframe.   DERMATOLOGIC: The patient has a small ulceration on the plantar left hallux at  likely what is the proximal phalanx head in that region. Debridement of this using a sharp 15 blade to excise some of the peripheral tissue surrounding centrally as well as peripherally using a 15 blade this was debrided and devitalized tissue was excised from the region. The area was then probed with a sterile Q-tip and was seen to be a grade I lesion, did not penetrate into the subcutaneous tissue or down to tendinous or bony tissues either. There was no cellulitis from around the digital area. No redness or erythema to the toe in and of itself or the plantar aspect of the foot. Also, there was no purulent drainage, just some normal bleeding occurred with debridement of the region.   CLINICAL IMPRESSION: I don't see any direct evidence that this wound is responsible for his fevers and cellulitis. This may be independent of the ulceration since there's no localized evidence of infection to the region. There was also no probing down to deeper tissues.   TREATMENT PLAN: I utilized a 15 blade to debride and excise devitalized tissue from the periphery and central portion of the wound, irrigate and cleanse after this and put a padded dressing on the area. I will follow him again tomorrow and see how well he is doing. I recommend getting in a protective boot that he got at [facility] to help take pressure off these areas. They are going to try to get into that tomorrow and I will take a look at it and see if he  has any other further problems or difficulty with it at that timeframe.     Would not recommend any further testing with x-rays or bone scans at this juncture. Do not recommend an MRI either.   ____________________________ Gerrit Heck. Michaelpaul Apo, DPM mgt:drc D: 03/08/2011 17:48:06 ET T: 03/09/2011 08:03:49 ET JOB#: [id number]  cc: Gerrit Heck Hopelynn Gartland, DPM, <Dictator> Perry Mount MD ELECTRONICALLY SIGNED 03/17/2011 11:30

## 2014-05-25 NOTE — H&P (Signed)
PATIENT NAME:  Raymond [name], Raymond [name] MR#:  [id number] DATE OF BIRTH:  07/08/1965  DATE OF ADMISSION:  03/08/2011  REFERRING PHYSICIAN: Dr. Owens Shark   PRIMARY CARE PHYSICIAN: Dr. Tomasita Morrow at [facility]: Lower extremity increasing edema and warmth.   HISTORY OF PRESENT ILLNESS: Mr. Wlodarczyk is a pleasant 49 year old gentleman with history of diabetes, hypertension, neuropathy, obesity, and obstructive sleep apnea who presents today with reports of developing increased lower extremity swelling, erythema, and warmth yesterday. He reports the left side is greater than right side. He has neuropathy, therefore, does not feel pain in his lower extremity but does have some pain sensation in his bilateral thighs. He has history of infection of his legs and this is typically how it starts. He endorses subjective fevers. On presentation he had a temperature of 102.5, hypertensive 203/95, and tachycardic at 108. He denies any chest pain. He had shortness of breath on initial arrival but now is improved. He was noted to have increased respiratory rate as well. He denies any subjective palpitations, presyncope, or syncope.   PAST MEDICAL HISTORY:  1. Diabetes, insulin requiring.  2. Hypertension.  3. Peripheral neuropathy.  4. Obesity.  5. Obstructive sleep apnea, on CPAP.  6. Hepatic steatosis.   PAST SURGICAL HISTORY:  1. Cholecystectomy.  2. Bilateral great toe amputation. 3. Left second toe amputation. 4. Left foot incision and drainage and removal of foreign body.  5. Right hip surgery as a child.   ALLERGIES: No known drug allergies.   MEDICATIONS:  1. Metformin 1000 mg b.i.d.  2. Aspirin 81 mg daily.  3. Humalog 60 units t.i.d.  4. Lantus 60 units b.i.d.  5. Lasix 40 mg daily.  6. Neurontin 300 mg t.i.d.   FAMILY HISTORY: Significant for diabetes and hypertension.   SOCIAL HISTORY: He lives in [place] with his girlfriend. He denies any tobacco.  Typically does not drink but did have some alcohol at [employer]. He is awaiting disability.   FAMILY HISTORY: Diabetes and hypertension.  REVIEW OF SYSTEMS: CONSTITUTIONAL: Endorses subjective fevers and chills. No nausea or vomiting. EYES: No inflammation, glaucoma. ENT: No ear pain, epistaxis, discharge. RESPIRATORY: No cough, wheezing, hemoptysis. He reports shortness of breath on initial arrival. CARDIOVASCULAR: No chest pain, orthopnea. Reports edema as per history of present illness. No palpitations or syncope. GI: No nausea, vomiting, diarrhea, abdominal pain, hematemesis. He has been constipated. No bright red blood per rectum or melena. GU: No dysuria or hematuria. ENDOCRINE: No polyuria or polydipsia. He has history of uncontrolled diabetes. HEME: No easy bleeding. SKIN: He has a toe ulcer at the left base. MUSCULOSKELETAL: Pain of his bilateral thigh. NEUROLOGIC: No one-sided weakness or numbness. He has neuropathy of his hands and lower leg. PSYCH: Denies any depression or suicidal ideation.   PHYSICAL EXAMINATION:   VITAL SIGNS: Temperature 102.5, pulse 108, respiratory rate initially 36, currently 22, blood pressure 203/95, repeat blood pressure currently 115/52, sating at 94% on room air.   GENERAL: Lying in bed in no apparent distress.   HEENT: Normocephalic, atraumatic. Pupils equal, symmetric, nonicteric. Nasal cannula in place. Moist mucous membrane.   NECK: Soft and supple and large. No adenopathy or JVP.   CARDIOVASCULAR: Tachycardic. No murmurs, rubs, or gallops.   LUNGS: Faint basilar crackles. No use of accessory muscles or increased respiratory effort.   ABDOMEN: Soft. Positive bowel sounds. Obese. No mass appreciated. Difficult to assess.   EXTREMITIES: 2+ pitting edema, left greater than right, with  warmth as well as erythema, left greater than right. Dorsal pedis pulses faint.   MUSCULOSKELETAL: The patient is status post amputation of his left second and great  toe as well as his right great toe.   PSYCH: He is alert and oriented. The patient is cooperative.   NEUROLOGIC: Symmetrical strength of upper extremities. No focal deficits.   PERTINENT LABS AND STUDIES: WBC 17.4, hemoglobin 13.8, hematocrit 41.3, platelets 317, MCV 84, glucose 120, BUN 24, creatinine 1.24, sodium 144, potassium 3.9, chloride 104, carbon dioxide 29, calcium 9.1. LFTs within normal.   ASSESSMENT AND PLAN: Mr. Furia is a 49 year old gentleman with history of diabetes, hypertension, neuropathy, recurrent lower extremity ulcers and infection presenting with two days of lower extremity swelling, erythema, and warmth.  1. Sepsis and left lower extremity cellulitis with right foot diabetic ulcer. His right extremity is edematous but does not feel warm or red. Will attempt to get wound culture from his ulcer. His blood cultures have been sent. He has received vancomycin in the ED. Will obtain pharmacy consultation for Vanc dosing. Will continue with Zosyn. Podiatry consultation with Dr. Cleda Mccreedy to evaluate if he will need debridement or amputation. Will obtain lower extremity Doppler's. He has minimal pain due to neuropathy.  2. Diabetes. Hold metformin. Restart his Humalog and Lantus. Send A1c.  3. Obstructive sleep apnea. Restart CPAP.  4. Hypertension. Restart enalapril.  5. Prophylaxis with Lovenox, aspirin, and Protonix.    TIME SPENT: Approximately 45 minutes spent on patient care.   ____________________________ Rita Ohara, MD ap:drc D: 03/08/2011 06:34:38 ET T: 03/08/2011 06:55:26 ET JOB#: [id number]  cc: Brien Few Althia Egolf, MD, <Dictator> Myrle Sheng. Jimmye Norman, MD Rita Ohara MD ELECTRONICALLY SIGNED 03/29/2011 0:36

## 2014-06-01 NOTE — H&P (Signed)
PATIENT NAME:  Raymond [name], WESSLER MR#:  [id number] DATE OF BIRTH:  Jun 15, 1965  DATE OF ADMISSION:  03/30/2014  PRIMARY CARE PHYSICIAN: Myrle Sheng. Jimmye Norman, MD  REFERRING PHYSICIAN: Baird Cancer. Quale, MD  CHIEF COMPLAINT: Shortness of breath for 1 week.   HISTORY OF PRESENT ILLNESS: A 49 year old [place] American male with a history of hypertension, diabetes, presented to the ED with the above chief complaint. The patient is alert, awake, oriented, in no acute distress. The patient said that he has had shortness of breath for the past 1 week. In addition, the patient has a cough with sputum. The patient also complains of substernal intermittent chest pain without radiation for the past 1 week, but the patient denies any palpitations, orthopnea, or nocturnal dyspnea. The patient said he has leg edema. The patient's chest x-ray showed congestion. The patient was treated with IV Lasix and admitted for CHF.   PAST MEDICAL HISTORY: Lower extremity cellulitis, hypertension, diabetes, PVD, obesity, obstructive sleep apnea, hepatic steatosis.  PAST SURGICAL HISTORY: Cholecystectomy, bilateral great toe amputation, left second toe amputation, left foot incision and drainage and removal of a foreign body, right hip surgery as a child.   FAMILY HISTORY: Hypertension, diabetes.   SOCIAL HISTORY: No smoking or drinking or illicit drugs.   ALLERGIES: None.  HOME MEDICATIONS: Protonix 40 mg p.o. daily, Levemir 60 units subcutaneous twice a day, Klor-Con 8 mEq once a day, Humalog 60 units subcutaneous 3 times a day, Glucophage 1000 mg twice a day, gabapentin 300 mg p.o. 3 times a day, Lasix 40 mg once a day, enalapril 10 mg p.o. daily, Coreg 6.25 mg p.o. b.i.d., aspirin 81 mg p.o. daily.  REVIEW OF SYSTEMS:  CONSTITUTIONAL: The patient denies any fever or chills. No headache or dizziness or weakness.  EYES: No double vision, blurry vision.  EARS, NOSE, AND THROAT: No postnasal drip, slurred speech, or  dysphagia.  CARDIOVASCULAR: Positive for chest pain. No palpitations, orthopnea, or nocturnal dyspnea, but has leg edema.  PULMONARY: Positive for cough, sputum, shortness of breath, but no hemoptysis or wheezing.  GASTROINTESTINAL: No abdominal pain, nausea, vomiting, diarrhea. No melena or bloody stool.  GENITOURINARY: No dysuria, hematuria, or incontinence.  SKIN: No rash or jaundice.  NEUROLOGY: No syncope, loss of consciousness, or seizure.  ENDOCRINOLOGY: No polyuria, polydipsia, heat or cold intolerance.   PHYSICAL EXAMINATION:  VITAL SIGNS: Temperature 99.1, blood pressure 127/84, respirations 14, pulse 73, oxygen level 75% on room air, but 99% on oxygen by nasal cannula.  GENERAL: The patient is alert, awake, oriented, in no acute distress.  HEENT: Pupils round, equal, reactive to light and accommodation. Moist oral mucosa. Clear oropharynx.  NECK: Supple. No JVD or carotid bruits. No lymphadenopathy. No thyromegaly.  CARDIOVASCULAR: S1 and S2. Regular rate, rhythm. No murmurs, gallops.  PULMONARY: Bilateral air entry. No wheezing, but has basilar rales. No use of accessory muscles to breathe.  ABDOMEN: Soft, obese. No distention or tenderness. No organomegaly. Bowel sounds present.  EXTREMITIES: Bilateral lower extremity edema, 1+. No clubbing or cyanosis. No calf tenderness. No erythema or tenderness. Bilateral pedal pulses present.  SKIN: No rash or jaundice.  NEUROLOGIC: A and O x 3. No focal deficit. Power 5/5. Sensation intact.   LABORATORY DATA: Troponin 0.04, CK 75, CK-MB 1.5. INR 0.9. Glucose 254, BUN 13, creatinine 1.12. Electrolytes are normal. WBC 10.8, hemoglobin 14.5, platelets 270,000. D-dimer 1006. BNP 1301. Chest x-ray shows mild vascular congestion without pulmonary edema or pleural effusion, bilateral bibasilar atelectasis.   Urinalysis  is negative. EKG showed normal sinus rhythm at 74 BPM.   IMPRESSIONS:  1.  Acute respiratory failure with hypoxia.  2.  Acute  congestive heart failure.  3.  Hypertension.  4.  Diabetes.  5.  Chest pain with shortness of breath and elevated D-dimer, need to rule out pulmonary embolism and deep vein thrombosis.   PLAN OF TREATMENT:  1.  The patient will be admitted to medical floor with telemetry monitor. I will start CHF protocol with Lasix 40 mg IV b.i.d. and also get an echocardiograph.  2.  I will check a CT angiogram and bilateral lower extremity duplex to rule out PE or DVT.  3.  For diabetes, I will continue the patient's home medications, Levemir and NovoLog, and sliding scale.  4.  For hypertension, continue enalapril and Coreg. Continue aspirin. In addition, I will put the patient on Lovenox 1 mg/kg subcutaneously q. 12 hours. Lovenox may be discontinued if pulmonary embolism and deep vein thrombosis workups are negative.  I discussed the patient's condition and plan of treatment with the patient.  CODE STATUS: The patient wants full code.   TIME SPENT: About 65 minutes.    ____________________________ Demetrios Loll, MD qc:ST D: 03/30/2014 22:05:08 ET T: 03/30/2014 22:49:05 ET JOB#: [id number]  cc: Demetrios Loll, MD, <Dictator> Demetrios Loll MD ELECTRONICALLY SIGNED 03/31/2014 18:21

## 2014-06-01 NOTE — Consult Note (Signed)
PATIENT NAME:  Raymond [name], Raymond [name] MR#:  [id number] DATE OF BIRTH:  1965/10/26  DATE OF CONSULTATION:  04/04/2014  REQUESTING PHYSICIAN: Hillary Bow, MD   DICTATING CONSULTING PHYSICIAN:  A. Lavone Orn, MD  CHIEF COMPLAINT: Uncontrolled diabetes.   HISTORY OF PRESENT ILLNESS: This is a 49 year old male with a history of type 2 diabetes who was admitted on 03/30/2014 for congestive heart failure. Initially blood sugars were in the 200-300 range. The patient was started on Solu-Medrol on 04/02/2014, which has contributed to worsened blood sugars. The patient's diabetes regimen includes Levemir 60 units twice daily and NovoLog insulin 60 units 3 times daily. Despite this, sugars have been in the 330-570 range over the last 24 hours. The patient denies nausea or vomiting. Reports good appetite. He continues to eat a full diet.  Does report increased thirst and increased urination.   The patient with a history of diabetes for the last 20 years. Estimates he has been taking insulin for about 10 years. Diabetes is managed by his primary care physician at [facility]. Last hemoglobin A1C was 9.6%. He acknowledges that diabetes is uncontrolled. Diabetes is complicated by peripheral neuropathy with history of multiple toe amputations. Outpatient regimen includes Levemir 60 units b.i.d., Humalog 60 units t.i.d. a.c. and metformin 1000 mg b.i.d. He does not typically check blood sugars.   PAST MEDICAL HISTORY:  1.  Type 2 diabetes mellitus.  2.  Diabetic peripheral neuropathy.  3.  Morbid obesity.  4.  Hypertension.  5.  Peripheral vascular disease.  6.  Obstructive sleep apnea.  7.  Fatty liver disease.  8.  History of multiple toe amputations.   SOCIAL HISTORY: The patient does not smoke or drink alcohol. He is disabled due to his diabetes. He lives with a roommate.   FAMILY HISTORY: Positive for both parents with diabetes. No known heart disease or malignancies.    ALLERGIES: No known drug  allergies.   CURRENT MEDICATIONS:  1.  Levemir 60 units b.i.d.  2.  NovoLog 6 units t.i.d.  3.  Diamox 250 mg q.12 hours.  4.  Coreg 6.25 mg b.i.d.  5.  Docusate 100 mg q.12 h.  6.  Enalapril 10 mg daily.  7.  Gabapentin 300 mg t.i.d.  8.  Methylprednisolone 20 mg IV daily.  9.  Protonix 40 mg daily.  10.  Potassium chloride ER 20 mEq daily.  11.  Senokot 2 tabs b.i.d.  12.  Aspirin 81 mg daily.  13.  Lovenox 40 mg subcutaneous daily.  14.  Pulmicort Nebs  0.5 mg every 12 hours.  15.  Albuterol/ipratropium nebs 3 mL q.4 hours.  16.  Lasix 40 mg IV q.12 h.   REVIEW OF SYSTEMS:    GENERAL: Denies weight loss/gain, denies fevers.  HEENT: Denies blurred vision.  NECK: Denies neck pain or dysphagia.  CARDIAC: Denies chest pain or palpitation.  PULMONARY: Reports shortness of breath. Reports dyspnea on exertion.  EXTREMITIES: Reports lower extremity swelling. Reports myalgias.  ENDOCRINE: Denies heat or cold intolerance.  GENITOURINARY: Reports increased frequency of urination. Denies hematuria.  ABDOMEN: Denies abdominal pain. Reports good appetite.  SKIN: Denies rash or recent skin changes.   PHYSICAL EXAMINATION:  VITAL SIGNS: Height 73.9 inches, weight 366 pounds, BMI 47.1. Temperature 98.5, pulse 72, respirations 21, BP 138/89, pulse oximetry 92% on high flow O2 by nasal cannula.  GENERAL: African American male in no acute distress sitting up eating dinner, calm, cooperative.  HEENT: Extraocular movements are intact, oropharynx is  clear.  NECK: Supple. No appreciable thyromegaly. No palpable thyroid nodules. No neck pain elicited.  CARDIAC: Regular rate and rhythm without murmur.  PULMONARY: Clear bilaterally. No wheeze.  ABDOMEN: Diffusely soft, nontender, and nondistended.  EXTREMITIES: Trace peripheral edema. TED hose in place.  SKIN: No rash or dermatopathy appreciated. Acanthosis nigricans fairly severe on neck and other skin folds.  PSYCHIATRIC: Alert and  oriented.  LABORATORY DATA:  Glucose 410, BUN 34, creatinine 1.3, sodium 133, potassium 4.9, calcium 9.4.  On  03/31/2014, TSH 0.730  and hemoglobin A1c 9.6%.   ASSESSMENT:  1.  Severely uncontrolled diabetes.  2.  Diabetic peripheral neuropathy.  3.  History of multiple toe amputations.  4.  Concurrent use of IV glucocorticoids.  5.  Morbid obesity.   RECOMMENDATIONS:  1.  Given his severe hyperglycemia and plan for persistent use of glucocorticoids, I recommend we start an IV insulin drip. This will be started tonight at 5 units per hour. Nursing will check blood sugars every hour and titrate it to target blood sugars per protocol.  2.  Will stop the Levemir and NovoLog insulin.  3.  Recommend he be continued on IV insulin during hospitalization in Critical Care Unit. Once clinical condition has improved and he is ready to leave the Critical Care Unit, transition to subcutaneous insulin with Levemir plus a NovoLog insulin sliding scale, per protocol based on requirements for insulin on the IV insulin drip.  4.  Consider restarting metformin once clinical status improves.  5.  Consider titrating off steroids, as clinically tolerated.  6.  Will arrange for outpatient follow-up once he is clinically ready for discharge.   Thank you for the kind request for consultation.  I am not available over this upcoming weekend, however, will be able to see patient if he remains hospitalized on Monday, 04/07/2014.    ____________________________ A. Lavone Orn, MD ams:at D: 04/04/2014 17:39:00 ET T: 04/04/2014 18:15:30 ET JOB#: [id number]  cc: A. Lavone Orn, MD, <Dictator> Sherlon Handing MD ELECTRONICALLY SIGNED 04/15/2014 17:12

## 2014-06-01 NOTE — Discharge Summary (Signed)
PATIENT NAME:  Raymond [name], Raymond [name] MR#:  [id number] DATE OF BIRTH:  21-Mar-1965  DATE OF ADMISSION:  03/30/2014 DATE OF DISCHARGE:  04/08/2014  PRIMARY CARE PHYSICIAN: Myrle Sheng. Jimmye Norman, MD at the [facility].  FINAL DIAGNOSES:  1.  Acute and now chronic respiratory failure, hypoxia and hypercarbic.  2.  Suspected obesity hypoventilation syndrome, possible chronic obstructive pulmonary with hypercarbia sleep apnea.  3.  Acute diastolic congestive heart failure.  4.  Hypertension.  5.  Morbid obesity.  6.  Diabetes.  7.  Gastroesophageal reflux disease without esophagitis.   MEDICATIONS ON DISCHARGE: Include gabapentin 300 mg 3 times a day, aspirin 81 mg daily, Coreg 6.25 mg twice a day, Protonix 40 mg daily, Humalog 60 units 3 times a day, Levemir 70 units subcutaneous injection twice a day, enalapril decreased to 2.5 mg daily, furosemide increased to 40 mg twice a day, potassium chloride 20 mEq daily. Stop taking Glucophage.  HOME OXYGEN: Yes, 2 liters nasal cannula with Trilogy machine at night if that gets approved. If it does not get approved then the BiPAP at night.   DIET: Low sodium diet, carbohydrate-controlled diet, regular consistency.   ACTIVITY: As tolerated.   FOLLOWUP: With Dr. Stevenson Clinch in 3-4 weeks; CHF Clinic in 1-2 weeks with Dr. Tomasita Morrow.   HOSPITAL COURSE: The patient was admitted 03/30/2014, discharged 04/08/2014, came in with shortness of breath for 1 week, admitted with acute respiratory failure, hypoxia, acute congestive heart failure. The patient was given IV Lasix.   Laboratory and radiological data during the hospital course included an EKG that showed normal sinus rhythm, cannot rule out inferior infarct. Urinalysis 1+ blood, greater than 500 mg/dL of glucose. Chest x-ray showed bibasilar atelectasis, mild vascular congestion without pulmonary edema or pleural effusion. BNP 1301, D-dimer 1006. White blood cell count 10.8, hemoglobin 14.5, hematocrit 46.8,  platelet count of 270,000. Glucose 254, BUN 13, creatinine 1.12, sodium 143, potassium 4.3, chloride 101, CO2 of 37, calcium 8.8.   CT scan of the chest to rule out pulmonary embolism showed no evidence of significant pulmonary embolism, patchy bilateral airspace opacities likely reflect atelectasis, minimal interstitial prominence of uncertain significance would correlate for evidence of mild interstitial edema. TSH of 0.73. Hemoglobin A1c of 9.6. LDL 23, HDL 49, triglycerides 62. Echocardiogram showed EF of 50% to 55%, elevated mean left atrial pressure, impaired relaxation of left ventricular diastolic filling, severely dilated left atrium, mild mitral valve regurgitation, moderately increased left ventricular posterior wall thickness. Ultrasound of the lower extremities, no evidence of DVT.   Creatinine upon discharge 1.09, sodium 141, potassium 3.9, chloride 102, CO2 of 28, calcium 8.6. The last ABG on March 4 showed a pH of 7.27, pCO2 of 58, pO2 of 96. That was on 100% high flow nasal cannula. Bicarbonate 26.6, oxygen saturation 96.1.   HOSPITAL COURSE PER PROBLEM LIST:  1.  For the patient's acute now chronic respiratory failure hypoxia and hypercarbic, the patient was on 100% high flow nasal cannula for numerous days. In speaking with Dr. Mortimer Fries we just took him off since he was not complaining of anything and he did well once coming off the high flow nasal cannula. He did qualify for home oxygen, when he walked around his pulse oximetry did drop down to 83, so he was discharged home on 2 liters nasal cannula. I think this was mostly CHF but combination of obesity-hypoventilation syndrome, possible COPD with hypercarbia and sleep apnea contributing.  2.  Suspected obesity-hypoventilation syndrome. Possible COPD with hypercarbia  and sleep apnea. We did try to get a Trilogy approved. If this gets approved, he will be using the Trilogy at night. If it does not get approved, he will go back on his BiPAP.   3.  Acute diastolic congestive heart failure. The patient was diuresed aggressively with IV Lasix and also IV Diamox. The patient's CO2 did come down. The patient did respond with good diuresis. The patient's weight upon discharge is 366 with a BMI of 47.0. The patient was urinating well. I did have to hold the Lasix at one point where his creatinine went up to 1.66 and had a Lasix holiday. I put him back on oral Lasix. Creatinine remained stable.  4.  Hypertension. Blood pressure on the lower side. I cut back on his enalapril. Continue the Coreg dose.  5.  Morbid obesity with a BMI of 47. Weight loss is recommended. I did speak to him about gastric banding procedure. If he would want to be a candidate for that, he can follow up with his primary care physician and consider bariatric surgery referral as outpatient.  6.  Diabetes. The patient was seen in consultation by Dr. Gabriel Carina. When the patient was on steroids, the sugars were very high requiring insulin drip in the ICU, now back on his usual medications.  7.  Gastroesophageal reflux disease without esophagitis, back on Protonix.  TIME SPENT ON DISCHARGE: Thirty-five minutes.    ____________________________ Tana Conch. Leslye Peer, MD rjw:TM D: 04/08/2014 15:51:55 ET T: 04/09/2014 00:30:10 ET JOB#: [id number]  cc: Tana Conch. Leslye Peer, MD, <Dictator> Myrle Sheng. Jimmye Norman, MD Vilinda Boehringer, MD Marisue Brooklyn MD ELECTRONICALLY SIGNED 04/09/2014 10:54

## 2014-06-01 NOTE — Consult Note (Signed)
Chief Complaint and History:  Referring Physician Dr. Darvin Neighbours   Chief Complaint Uncontrolled diabetes   Allergies:  No Known Allergies:   Assessment/Plan:  Assessment/Plan 49 yo M with morbid obesity (BMI 47) and uncontrolled type 2 diabetes admitted with CHF exacerabation. Longterm uncontrolled diabetes with A1c of 9.6%. Now on steroids, sugars are in the 300-500 range.   Pt was interviewed, examined, and chart reviewed.  A/ Uncontrolled diabetes  P/ DC SQ insulin. Start IV insulin at 5 units/hr and titrate per protocol.  Consider weaning steroids off as clinically tol. Carb control diet. Once ready to leave ICU, switch to SQ insulin per conversion protocol -- include BID Levemir, TID AC NovoLog and TID AC NovoLog Sliding scale.  I am unavailbe to see over weekend. Will see him again Monday 3/7 if he remains hospitalized.  Full consult was dictated.   Electronic Signatures: Judi Cong (MD)  (Signed 04-Mar-16 17:45)  Authored: Chief Complaint and History, ALLERGIES, Assessment/Plan   Last Updated: 04-Mar-16 17:45 by Judi Cong (MD)

## 2014-06-17 ENCOUNTER — Ambulatory Visit (INDEPENDENT_AMBULATORY_CARE_PROVIDER_SITE_OTHER): Payer: Medicare PPO | Admitting: Internal Medicine

## 2014-06-17 ENCOUNTER — Ambulatory Visit: Payer: Medicare PPO

## 2014-06-17 DIAGNOSIS — R0600 Dyspnea, unspecified: Secondary | ICD-10-CM

## 2014-06-17 LAB — PULMONARY FUNCTION TEST
DL/VA % pred: 98 %
DL/VA: 4.79 ml/min/mmHg/L
DLCO unc % pred: 70 %
DLCO unc: 26.77 ml/min/mmHg
FEF 25-75 Post: 1.18 L/sec
FEF 25-75 Pre: 1.55 L/sec
FEF2575-%Change-Post: -23 %
FEF2575-%Pred-Post: 32 %
FEF2575-%Pred-Pre: 41 %
FEV1-%Change-Post: -9 %
FEV1-%Pred-Post: 46 %
FEV1-%Pred-Pre: 51 %
FEV1-Post: 1.78 L
FEV1-Pre: 1.98 L
FEV1FVC-%Change-Post: -16 %
FEV1FVC-%Pred-Pre: 93 %
FEV6-%Change-Post: 8 %
FEV6-%Pred-Post: 61 %
FEV6-%Pred-Pre: 56 %
FEV6-Post: 2.86 L
FEV6-Pre: 2.64 L
FEV6FVC-%Pred-Post: 103 %
FEV6FVC-%Pred-Pre: 103 %
FVC-%Change-Post: 8 %
FVC-%Pred-Post: 59 %
FVC-%Pred-Pre: 55 %
FVC-Post: 2.86 L
FVC-Pre: 2.64 L
Post FEV1/FVC ratio: 62 %
Post FEV6/FVC ratio: 100 %
Pre FEV1/FVC ratio: 75 %
Pre FEV6/FVC Ratio: 100 %
RV % pred: 67 %
RV: 1.54 L
TLC % pred: 62 %
TLC: 4.89 L

## 2014-06-17 NOTE — Progress Notes (Signed)
SMW performed today. 

## 2014-06-17 NOTE — Progress Notes (Signed)
PFT performed today. 

## 2014-06-24 ENCOUNTER — Ambulatory Visit: Payer: Medicare PPO | Admitting: Internal Medicine

## 2014-07-07 ENCOUNTER — Ambulatory Visit (INDEPENDENT_AMBULATORY_CARE_PROVIDER_SITE_OTHER): Payer: Medicare PPO | Admitting: Internal Medicine

## 2014-07-07 ENCOUNTER — Encounter: Payer: Self-pay | Admitting: Internal Medicine

## 2014-07-07 VITALS — BP 142/70 | HR 74 | Temp 98.3°F | Ht 74.0 in | Wt 375.0 lb

## 2014-07-07 DIAGNOSIS — J449 Chronic obstructive pulmonary disease, unspecified: Secondary | ICD-10-CM | POA: Insufficient documentation

## 2014-07-07 DIAGNOSIS — G4733 Obstructive sleep apnea (adult) (pediatric): Secondary | ICD-10-CM

## 2014-07-07 DIAGNOSIS — R0902 Hypoxemia: Secondary | ICD-10-CM | POA: Diagnosis not present

## 2014-07-07 DIAGNOSIS — Z9981 Dependence on supplemental oxygen: Secondary | ICD-10-CM

## 2014-07-07 DIAGNOSIS — R0600 Dyspnea, unspecified: Secondary | ICD-10-CM

## 2014-07-07 MED ORDER — FLUTICASONE-SALMETEROL 500-50 MCG/DOSE IN AEPB: 1.0000 | INHALATION_SPRAY | Freq: Two times a day (BID) | RESPIRATORY_TRACT | Status: DC

## 2014-07-07 NOTE — Assessment & Plan Note (Signed)
The next study on 05/21/2014 showed AHI 106.3 with RDI of 16.3, CPAP was titrated to 14 cm of water, patient also required 2 L of oxygen.  Upon discharge from hospital and March 2015 he was placed on trilogy machine (NIPPV).   Plan: -Continue using trilogy machine as directed, on a nightly basis - 2 L of Oxygen with sleep -split night study done with results of cpap 14cm H20.  However, patient is on trilogy machine from last hospitalization, will request setting from DME, and adjust accordingly.  -Weight loss, exercise as tolerated.

## 2014-07-07 NOTE — Assessment & Plan Note (Signed)
Obstructive disease, such as COPD, could be adding to his dyspnea on exertion. Pulmonary function tests were resulted moderate obstruction, with severe reduction in ERV and mild to moderate reduction in DLCO; reduction DLCO could be seen from heart disease or prolonged exposure to tobacco. Patient is a never smoker, but was exposed to secondhand smoke for a number of years as a child from his parents. Given his level of obstruction and a never smoker will also further workup with alpha-1 antitrypsin level. Today, we also discussed the results of his pulmonary function tests in the level of obstruction. At this time we discussed starting him on Advair 500/50, will give this for 3 months, he was educated on use and administration of the product.  Plan: -Weight loss, exercise, healthy diet. -Advair 500/50-1 puff twice a day, gargle and rinse after each use -Avoid any forms of tobacco (cigarette smoking, vapors, E cigarettes, etc.).  -check alpha-1 antitrypsin level prior to follow-up visit

## 2014-07-07 NOTE — Assessment & Plan Note (Signed)
Multifactorial: Systolic CHF, obesity, obstructive sleep apnea, ? COPD  Plan: -Continue at 2 L [place] at night

## 2014-07-07 NOTE — Addendum Note (Signed)
Addended by: Devona Konig on: 07/07/2014 02:57 PM   Modules accepted: Orders

## 2014-07-07 NOTE — Patient Instructions (Addendum)
Following up with Dr. Stevenson Clinch in 3 months. - cont with diet and wt. Loss - monitor fluid intake and salt intake - we will start you on Advair 500/50, 1 puff BID - gargle and rinse after each use.  - continue with trilogy with 2L O2 bleed in, we will obtain the setting from your DME and making adjustments accordingly.  - we will also check a lab level due to the results on your breathing test (alpha 1 antitrypsin).  To be drawn prior to follow up visit.

## 2014-07-07 NOTE — Progress Notes (Signed)
MRN# [id number] Raymond [name] [birth year]   CC: Short of breath Chief Complaint  Patient presents with  . Follow-up    Pt here f/u SMW/PFT/sleep study      Brief History: HPI 04/28/14 Patient is a pleasant 49 year old male presents today for hospital followup visit of chronic respiratory failure secondary to obesity, obstructive sleep apnea, systolic CHF. Patient's past medical history of morbid obesity, diabetes, hypertension, systolic CHF (ejection fraction 50-55%). For full hospital admission details see below. Briefly, patient had one week of shortness of breath and presented to the hospital, noted to have desaturations in the mid 80s, was placed on BiPAP with moderate improvement, and discharged on a trilogy machine. Patient states that he has a diagnosis of sleep apnea since 2010, was initially on CPAP, and 2013 had arepeat sleep study, was placed on BiPAP 27-21, but has not been wearing his BiPAP machine on a nightly basis (maybe one time a). During hospitalization he was suspected to have untreated sleep apnea along with obesity hypoventilation syndrome. Today he presents for followup of sleep apnea and optimization of his sleep apnea along with his chronic hypoxic respiratory failure. During his visit today on room air he was noted to be satting anywhere between 88 and 91%. He recently followed up with the CHF clinic and is currently on twice a day diuretics. However, Per review of his medication list he is currently on furosemide 40 mg daily Patient states that he does have occasional swelling, he is able to walk but does have to wear 2 L of oxygen, he has not try climbing stairs recently, but thinks that he would become severely short of breath if he does try to climb stairs.  Plan:PFTs, 6MWT, PSG (split), Wt. Loss, CHF optimization  Events since last clinic visit: Patient presents for a follow up visit of DOE and occasional dyspnea at rest. Overall no worsening of  clinical status. Since last visit he has had a split night study, PFTs and 63mwt. He continues to follow along with CHF clinic, and has seen cardiology who has optimized his CHF regiment. He is using his trilogy machine (for sleep apnea) nightly.   Medication:   Current Outpatient Rx  Name  Route  Sig  Dispense  Refill  . aspirin EC 81 MG tablet   Oral   Take 81 mg by mouth daily.         . carvedilol (COREG) 12.5 MG tablet   Oral   Take 12.5 mg by mouth 2 (two) times daily with a meal.         . enalapril (VASOTEC) 20 MG tablet   Oral   Take 40 mg by mouth 2 (two) times daily.          . furosemide (LASIX) 40 MG tablet   Oral   Take 1 tablet (40 mg total) by mouth 2 (two) times daily as needed.   60 tablet   11   . gabapentin (NEURONTIN) 300 MG capsule   Oral   Take 300 mg by mouth 3 (three) times daily.         . insulin detemir (LEVEMIR) 100 UNIT/ML injection   Subcutaneous   Inject 70 Units into the skin 2 (two) times daily.         . insulin lispro (HUMALOG) 100 UNIT/ML cartridge   Subcutaneous   Inject 60 Units into the skin 3 (three) times daily with meals.         . metFORMIN (GLUCOPHAGE)  1000 MG tablet   Oral   Take 1,000 mg by mouth 2 (two) times daily with a meal.         . pantoprazole (PROTONIX) 40 MG tablet   Oral   Take 40 mg by mouth daily.         . potassium chloride (MICRO-K) 10 MEQ CR capsule   Oral   Take 10 mEq by mouth daily.            Review of Systems: Gen:  Denies  fever, sweats, chills HEENT: Denies blurred vision, double vision, ear pain, eye pain, hearing loss, nose bleeds, sore throat Cvc:  No dizziness, chest pain or heaviness Resp:   Admits to: Shortness of breath with exertion (chronic) Gi: Denies swallowing difficulty, stomach pain, nausea or vomiting, diarrhea, constipation, bowel incontinence Gu:  Denies bladder incontinence, burning urine Ext:   No Joint pain, stiffness or swelling Skin: No skin rash,  easy bruising or bleeding or hives Endoc:  No polyuria, polydipsia , polyphagia or weight change Other:  All other systems negative  Allergies:  Review of patient's allergies indicates no known allergies.  Physical Examination:  VS: BP 142/70 mmHg  Pulse 74  Temp(Src) 98.3 F (36.8 C) (Oral)  Ht 6\' 2"  (1.88 m)  Wt 375 lb (170.099 kg)  BMI 48.13 kg/m2  SpO2 96%  General Appearance: No distress  HEENT: PERRLA, no ptosis, no other lesions noticed Pulmonary:normal breath sounds., diaphragmatic excursion normal.No wheezing, No rales   Cardiovascular:  Normal S1,S2.  No m/r/g.     Abdomen:Exam: Benign, Soft, non-tender, No masses  Skin:   warm, no rashes, no ecchymosis  Extremities: normal, no cyanosis, clubbing, warm with normal capillary refill.  Trace pitting edema in bilateral lower extremities  Results: (The following results were reviewed by Dr. Stevenson Clinch). Pulmonary function testing 06/17/2014 FVC 55% FEV1 51% FEV1/FVC 75% SVC 69 IC 86% RV 67% TLC 60% RV/TLC 105% ERV 10% DLCO uncorrected 70%  Impression - GERD obstruction, without significant bronchodilation response. Severe reduction in ERV, could be related to body habitus, chest wall/abdominal wall obesity. Mild to moderate decrease in DLCO.   6 minute walk test-240 m/803 feet, lowest saturation 91%, highest heart rate 92  Assessment and Plan: 49 yo male with history of systolic heart failure, obstructive sleep apnea, morbid obesity, seen in consultation follow-up for dyspnea on exertion and results of ordered studies. OSA treated with Trilogy machine The next study on 05/21/2014 showed AHI 106.3 with RDI of 16.3, CPAP was titrated to 14 cm of water, patient also required 2 L of oxygen.  Upon discharge from hospital and March 2015 he was placed on trilogy machine (NIPPV).   Plan: -Continue using trilogy machine as directed, on a nightly basis - 2 L of Oxygen with sleep -split night study done with results of cpap  14cm H20.  However, patient is on trilogy machine from last hospitalization, will request setting from DME, and adjust accordingly.  -Weight loss, exercise as tolerated.     COPD, moderate Obstructive disease, such as COPD, could be adding to his dyspnea on exertion. Pulmonary function tests were resulted moderate obstruction, with severe reduction in ERV and mild to moderate reduction in DLCO; reduction DLCO could be seen from heart disease or prolonged exposure to tobacco. Patient is a never smoker, but was exposed to secondhand smoke for a number of years as a child from his parents. Given his level of obstruction and a never smoker will also further workup  with alpha-1 antitrypsin level. Today, we also discussed the results of his pulmonary function tests in the level of obstruction. At this time we discussed starting him on Advair 500/50, will give this for 3 months, he was educated on use and administration of the product.  Plan: -Weight loss, exercise, healthy diet. -Advair 500/50-1 puff twice a day, gargle and rinse after each use -Avoid any forms of tobacco (cigarette smoking, vapors, E cigarettes, etc.).  -check alpha-1 antitrypsin level prior to follow-up visit    Morbid obesity OBESITY  Wt: 375 lbs BMI: 48 Discussed importance of weight reduction.  Educated regarding limitation of  intake of greasy/fried foods.  Instructed on benefit of  a low-impact exercise program, starting slowly.  Discussed benefits of 30-45 minutes of some form of exercise daily as well as benefit of supervised exercise program.  Today discussed we goals of weight loss, we agreed that at follow-up visit he should lose a healthy weight of 10-12 pounds, which is about a pound per week.       Hypoxemia requiring supplemental oxygen Multifactorial: Systolic CHF, obesity, obstructive sleep apnea, ? COPD  Plan: -Continue at 2 L [place] at night      Dyspnea Multifactorial: Obstructive sleep  apnea, obesity, untreated OSA, systolic heart failure, ?COPD  Optimization of his heart failure along sleep apnea will greatly improve his level of dyspnea. Weight loss, healthy diet, and exercise, will address deconditioning issues that could also be adding to his dyspnea.  Plan: - Plan to start bronchodilatory therapy, see plan for COPD - healthy diet, weight loss, exercise as tolerated. -CHF optimization-currently following the CHF clinic, and Cardiology       Updated Medication List Outpatient Encounter Prescriptions as of 07/07/2014  Medication Sig  . aspirin EC 81 MG tablet Take 81 mg by mouth daily.  . carvedilol (COREG) 12.5 MG tablet Take 12.5 mg by mouth 2 (two) times daily with a meal.  . enalapril (VASOTEC) 20 MG tablet Take 40 mg by mouth 2 (two) times daily.   . furosemide (LASIX) 40 MG tablet Take 1 tablet (40 mg total) by mouth 2 (two) times daily as needed.  . gabapentin (NEURONTIN) 300 MG capsule Take 300 mg by mouth 3 (three) times daily.  . insulin detemir (LEVEMIR) 100 UNIT/ML injection Inject 70 Units into the skin 2 (two) times daily.  . insulin lispro (HUMALOG) 100 UNIT/ML cartridge Inject 60 Units into the skin 3 (three) times daily with meals.  . metFORMIN (GLUCOPHAGE) 1000 MG tablet Take 1,000 mg by mouth 2 (two) times daily with a meal.  . pantoprazole (PROTONIX) 40 MG tablet Take 40 mg by mouth daily.  . potassium chloride (MICRO-K) 10 MEQ CR capsule Take 10 mEq by mouth daily.   No facility-administered encounter medications on file as of 07/07/2014.    Orders for this visit: No orders of the defined types were placed in this encounter.    Thank  you for the visitation and for allowing  [facility] to assist in the care of your patient. Our recommendations are noted above.  Please contact us if we can be of further service.  Vilinda Boehringer, MD [facility] Office Number: [phone]

## 2014-07-07 NOTE — Assessment & Plan Note (Signed)
Multifactorial: Obstructive sleep apnea, obesity, untreated OSA, systolic heart failure, ?COPD  Optimization of his heart failure along sleep apnea will greatly improve his level of dyspnea. Weight loss, healthy diet, and exercise, will address deconditioning issues that could also be adding to his dyspnea.  Plan: - Plan to start bronchodilatory therapy, see plan for COPD - healthy diet, weight loss, exercise as tolerated. -CHF optimization-currently following the CHF clinic, and Cardiology

## 2014-07-07 NOTE — Assessment & Plan Note (Signed)
OBESITY  Wt: 375 lbs BMI: 48 Discussed importance of weight reduction.  Educated regarding limitation of  intake of greasy/fried foods.  Instructed on benefit of  a low-impact exercise program, starting slowly.  Discussed benefits of 30-45 minutes of some form of exercise daily as well as benefit of supervised exercise program.  Today discussed we goals of weight loss, we agreed that at follow-up visit he should lose a healthy weight of 10-12 pounds, which is about a pound per week.

## 2014-07-22 ENCOUNTER — Encounter: Payer: Self-pay | Admitting: Internal Medicine

## 2014-07-28 ENCOUNTER — Ambulatory Visit: Payer: Medicare PPO | Admitting: Family

## 2014-09-30 ENCOUNTER — Encounter: Payer: Self-pay | Admitting: Emergency Medicine

## 2014-09-30 ENCOUNTER — Inpatient Hospital Stay
Admission: EM | Admit: 2014-09-30 | Discharge: 2014-10-04 | DRG: 617 | Disposition: A | Discharge status: Skilled Nursing Facility | Payer: Medicare PPO | Attending: Internal Medicine | Admitting: Internal Medicine

## 2014-09-30 ENCOUNTER — Emergency Department: Payer: Medicare PPO

## 2014-09-30 DIAGNOSIS — Z95828 Presence of other vascular implants and grafts: Secondary | ICD-10-CM

## 2014-09-30 DIAGNOSIS — L03115 Cellulitis of right lower limb: Secondary | ICD-10-CM | POA: Diagnosis present

## 2014-09-30 DIAGNOSIS — K219 Gastro-esophageal reflux disease without esophagitis: Secondary | ICD-10-CM | POA: Diagnosis present

## 2014-09-30 DIAGNOSIS — Z9981 Dependence on supplemental oxygen: Secondary | ICD-10-CM

## 2014-09-30 DIAGNOSIS — M13849 Other specified arthritis, unspecified hand: Secondary | ICD-10-CM | POA: Diagnosis present

## 2014-09-30 DIAGNOSIS — Z7982 Long term (current) use of aspirin: Secondary | ICD-10-CM | POA: Diagnosis not present

## 2014-09-30 DIAGNOSIS — J449 Chronic obstructive pulmonary disease, unspecified: Secondary | ICD-10-CM | POA: Diagnosis present

## 2014-09-30 DIAGNOSIS — Z794 Long term (current) use of insulin: Secondary | ICD-10-CM

## 2014-09-30 DIAGNOSIS — G4733 Obstructive sleep apnea (adult) (pediatric): Secondary | ICD-10-CM | POA: Diagnosis present

## 2014-09-30 DIAGNOSIS — A419 Sepsis, unspecified organism: Secondary | ICD-10-CM

## 2014-09-30 DIAGNOSIS — I739 Peripheral vascular disease, unspecified: Secondary | ICD-10-CM | POA: Diagnosis present

## 2014-09-30 DIAGNOSIS — Z89429 Acquired absence of other toe(s), unspecified side: Secondary | ICD-10-CM

## 2014-09-30 DIAGNOSIS — Z9989 Dependence on other enabling machines and devices: Secondary | ICD-10-CM

## 2014-09-30 DIAGNOSIS — Z6841 Body Mass Index (BMI) 40.0 and over, adult: Secondary | ICD-10-CM | POA: Diagnosis not present

## 2014-09-30 DIAGNOSIS — L97809 Non-pressure chronic ulcer of other part of unspecified lower leg with unspecified severity: Secondary | ICD-10-CM | POA: Diagnosis present

## 2014-09-30 DIAGNOSIS — Z79899 Other long term (current) drug therapy: Secondary | ICD-10-CM | POA: Diagnosis not present

## 2014-09-30 DIAGNOSIS — E11621 Type 2 diabetes mellitus with foot ulcer: Secondary | ICD-10-CM | POA: Diagnosis present

## 2014-09-30 DIAGNOSIS — L97509 Non-pressure chronic ulcer of other part of unspecified foot with unspecified severity: Secondary | ICD-10-CM

## 2014-09-30 DIAGNOSIS — Z833 Family history of diabetes mellitus: Secondary | ICD-10-CM

## 2014-09-30 DIAGNOSIS — K760 Fatty (change of) liver, not elsewhere classified: Secondary | ICD-10-CM | POA: Diagnosis present

## 2014-09-30 DIAGNOSIS — M7989 Other specified soft tissue disorders: Secondary | ICD-10-CM | POA: Diagnosis present

## 2014-09-30 DIAGNOSIS — E13621 Other specified diabetes mellitus with foot ulcer: Secondary | ICD-10-CM

## 2014-09-30 DIAGNOSIS — M869 Osteomyelitis, unspecified: Secondary | ICD-10-CM

## 2014-09-30 DIAGNOSIS — I5022 Chronic systolic (congestive) heart failure: Secondary | ICD-10-CM | POA: Diagnosis present

## 2014-09-30 DIAGNOSIS — Z8249 Family history of ischemic heart disease and other diseases of the circulatory system: Secondary | ICD-10-CM

## 2014-09-30 DIAGNOSIS — L97519 Non-pressure chronic ulcer of other part of right foot with unspecified severity: Secondary | ICD-10-CM | POA: Diagnosis present

## 2014-09-30 DIAGNOSIS — I10 Essential (primary) hypertension: Secondary | ICD-10-CM | POA: Diagnosis present

## 2014-09-30 DIAGNOSIS — L97529 Non-pressure chronic ulcer of other part of left foot with unspecified severity: Secondary | ICD-10-CM | POA: Diagnosis present

## 2014-09-30 DIAGNOSIS — E1140 Type 2 diabetes mellitus with diabetic neuropathy, unspecified: Secondary | ICD-10-CM | POA: Diagnosis present

## 2014-09-30 DIAGNOSIS — Z7951 Long term (current) use of inhaled steroids: Secondary | ICD-10-CM

## 2014-09-30 DIAGNOSIS — N179 Acute kidney failure, unspecified: Secondary | ICD-10-CM | POA: Diagnosis present

## 2014-09-30 DIAGNOSIS — J9611 Chronic respiratory failure with hypoxia: Secondary | ICD-10-CM | POA: Diagnosis present

## 2014-09-30 LAB — GLUCOSE, CAPILLARY
Glucose-Capillary: 171 mg/dL — ABNORMAL HIGH (ref 65–99)
Glucose-Capillary: 267 mg/dL — ABNORMAL HIGH (ref 65–99)

## 2014-09-30 LAB — CBC
HCT: 35.8 % — ABNORMAL LOW (ref 40.0–52.0)
HCT: 34.8 % — ABNORMAL LOW (ref 40.0–52.0)
Hemoglobin: 11.4 g/dL — ABNORMAL LOW (ref 13.0–18.0)
Hemoglobin: 11 g/dL — ABNORMAL LOW (ref 13.0–18.0)
MCH: 25.9 pg — ABNORMAL LOW (ref 26.0–34.0)
MCH: 25.9 pg — ABNORMAL LOW (ref 26.0–34.0)
MCHC: 31.9 g/dL — ABNORMAL LOW (ref 32.0–36.0)
MCHC: 31.6 g/dL — ABNORMAL LOW (ref 32.0–36.0)
MCV: 81 fL (ref 80.0–100.0)
MCV: 81.9 fL (ref 80.0–100.0)
Platelets: 353 10*3/uL (ref 150–440)
Platelets: 345 10*3/uL (ref 150–440)
RBC: 4.42 MIL/uL (ref 4.40–5.90)
RBC: 4.25 MIL/uL — ABNORMAL LOW (ref 4.40–5.90)
RDW: 15 % — ABNORMAL HIGH (ref 11.5–14.5)
RDW: 15.7 % — ABNORMAL HIGH (ref 11.5–14.5)
WBC: 17.9 10*3/uL — ABNORMAL HIGH (ref 3.8–10.6)
WBC: 16.8 10*3/uL — ABNORMAL HIGH (ref 3.8–10.6)

## 2014-09-30 LAB — BASIC METABOLIC PANEL
Anion gap: 7 (ref 5–15)
BUN: 23 mg/dL — ABNORMAL HIGH (ref 6–20)
CO2: 29 mmol/L (ref 22–32)
Calcium: 8.6 mg/dL — ABNORMAL LOW (ref 8.9–10.3)
Chloride: 100 mmol/L — ABNORMAL LOW (ref 101–111)
Creatinine, Ser: 1.41 mg/dL — ABNORMAL HIGH (ref 0.61–1.24)
GFR calc Af Amer: >60 mL/min (ref >60)
GFR calc non Af Amer: 56 mL/min — ABNORMAL LOW (ref >60)
Glucose, Bld: 195 mg/dL — ABNORMAL HIGH (ref 65–99)
Potassium: 4.3 mmol/L (ref 3.5–5.1)
Sodium: 136 mmol/L (ref 135–145)

## 2014-09-30 LAB — CREATININE, SERUM
Creatinine, Ser: 1.31 mg/dL — ABNORMAL HIGH (ref 0.61–1.24)
GFR calc Af Amer: >60 mL/min (ref >60)
GFR calc non Af Amer: >60 mL/min (ref >60)

## 2014-09-30 LAB — LACTIC ACID, PLASMA
Lactic Acid, Venous: 1.3 mmol/L (ref 0.5–2.0)
Lactic Acid, Venous: 1.3 mmol/L (ref 0.5–2.0)

## 2014-09-30 MED ORDER — VANCOMYCIN HCL IN DEXTROSE 1-5 GM/200ML-% IV SOLN
1000.0000 mg | Freq: Once | INTRAVENOUS | Status: AC
  Administered 2014-09-30: 1000 mg via INTRAVENOUS
  Filled 2014-09-30: qty 200

## 2014-09-30 MED ORDER — SODIUM CHLORIDE 0.9 % IV SOLN
INTRAVENOUS | Status: DC
  Administered 2014-09-30: 18:00:00 via INTRAVENOUS

## 2014-09-30 MED ORDER — GABAPENTIN 300 MG PO CAPS
300.0000 mg | ORAL_CAPSULE | Freq: Three times a day (TID) | ORAL | Status: DC
  Administered 2014-09-30 – 2014-10-04 (×11): 300 mg via ORAL
  Filled 2014-09-30 (×11): qty 1

## 2014-09-30 MED ORDER — POTASSIUM CHLORIDE CRYS ER 10 MEQ PO TBCR
10.0000 meq | EXTENDED_RELEASE_TABLET | Freq: Two times a day (BID) | ORAL | Status: DC
  Administered 2014-09-30 – 2014-10-04 (×7): 10 meq via ORAL
  Filled 2014-09-30 (×7): qty 1

## 2014-09-30 MED ORDER — PANTOPRAZOLE SODIUM 40 MG PO TBEC
40.0000 mg | DELAYED_RELEASE_TABLET | Freq: Every day | ORAL | Status: DC
  Administered 2014-09-30 – 2014-10-04 (×4): 40 mg via ORAL
  Filled 2014-09-30 (×4): qty 1

## 2014-09-30 MED ORDER — ENALAPRIL MALEATE 10 MG PO TABS
40.0000 mg | ORAL_TABLET | Freq: Two times a day (BID) | ORAL | Status: DC
  Administered 2014-09-30 – 2014-10-04 (×8): 40 mg via ORAL
  Filled 2014-09-30 (×8): qty 4

## 2014-09-30 MED ORDER — ASPIRIN EC 81 MG PO TBEC
81.0000 mg | DELAYED_RELEASE_TABLET | Freq: Every day | ORAL | Status: DC
  Administered 2014-09-30 – 2014-10-04 (×4): 81 mg via ORAL
  Filled 2014-09-30 (×5): qty 1

## 2014-09-30 MED ORDER — ONDANSETRON HCL 4 MG/2ML IJ SOLN: 4.0000 mg | Freq: Four times a day (QID) | INTRAMUSCULAR | Status: DC | PRN

## 2014-09-30 MED ORDER — HEPARIN SODIUM (PORCINE) 5000 UNIT/ML IJ SOLN
5000.0000 [IU] | Freq: Three times a day (TID) | INTRAMUSCULAR | Status: DC
  Administered 2014-09-30 – 2014-10-04 (×9): 5000 [IU] via SUBCUTANEOUS
  Filled 2014-09-30 (×11): qty 1

## 2014-09-30 MED ORDER — ONDANSETRON HCL 4 MG PO TABS: 4.0000 mg | ORAL_TABLET | Freq: Four times a day (QID) | ORAL | Status: DC | PRN

## 2014-09-30 MED ORDER — MORPHINE SULFATE (PF) 2 MG/ML IV SOLN
2.0000 mg | INTRAVENOUS | Status: DC | PRN
  Administered 2014-09-30 – 2014-10-03 (×7): 2 mg via INTRAVENOUS
  Filled 2014-09-30 (×7): qty 1

## 2014-09-30 MED ORDER — MOMETASONE FURO-FORMOTEROL FUM 200-5 MCG/ACT IN AERO
2.0000 | INHALATION_SPRAY | Freq: Two times a day (BID) | RESPIRATORY_TRACT | Status: DC
  Administered 2014-09-30 – 2014-10-04 (×8): 2 via RESPIRATORY_TRACT
  Filled 2014-09-30: qty 8.8

## 2014-09-30 MED ORDER — VANCOMYCIN HCL 10 G IV SOLR
1500.0000 mg | Freq: Three times a day (TID) | INTRAVENOUS | Status: DC
  Administered 2014-09-30 – 2014-10-03 (×8): 1500 mg via INTRAVENOUS
  Filled 2014-09-30 (×11): qty 1500

## 2014-09-30 MED ORDER — INSULIN ASPART 100 UNIT/ML ~~LOC~~ SOLN
60.0000 [IU] | Freq: Three times a day (TID) | SUBCUTANEOUS | Status: DC
  Administered 2014-10-01 (×2): 60 [IU] via SUBCUTANEOUS
  Filled 2014-09-30 (×2): qty 60

## 2014-09-30 MED ORDER — INSULIN LISPRO 100 UNIT/ML CARTRIDGE
60.0000 [IU] | Freq: Three times a day (TID) | SUBCUTANEOUS | Status: DC
  Filled 2014-09-30: qty 3

## 2014-09-30 MED ORDER — SODIUM CHLORIDE 0.9 % IV BOLUS (SEPSIS)
500.0000 mL | Freq: Once | INTRAVENOUS | Status: AC
  Administered 2014-09-30: 500 mL via INTRAVENOUS

## 2014-09-30 MED ORDER — PIPERACILLIN-TAZOBACTAM 4.5 G IVPB
4.5000 g | Freq: Three times a day (TID) | INTRAVENOUS | Status: DC
  Administered 2014-09-30 – 2014-10-04 (×11): 4.5 g via INTRAVENOUS
  Filled 2014-09-30 (×15): qty 100

## 2014-09-30 MED ORDER — HYDROCODONE-ACETAMINOPHEN 5-325 MG PO TABS
1.0000 | ORAL_TABLET | ORAL | Status: DC | PRN
  Administered 2014-10-02 – 2014-10-03 (×2): 2 via ORAL
  Administered 2014-10-03: 1 via ORAL
  Administered 2014-10-04: 2 via ORAL
  Filled 2014-09-30: qty 2
  Filled 2014-09-30: qty 1
  Filled 2014-09-30 (×2): qty 2

## 2014-09-30 MED ORDER — INSULIN DETEMIR 100 UNIT/ML ~~LOC~~ SOLN
75.0000 [IU] | Freq: Two times a day (BID) | SUBCUTANEOUS | Status: DC
  Administered 2014-09-30 – 2014-10-01 (×3): 75 [IU] via SUBCUTANEOUS
  Filled 2014-09-30 (×6): qty 0.75

## 2014-09-30 MED ORDER — ACETAMINOPHEN 325 MG PO TABS
650.0000 mg | ORAL_TABLET | Freq: Four times a day (QID) | ORAL | Status: DC | PRN
Start: 2014-09-30 — End: 2014-10-04

## 2014-09-30 MED ORDER — CARVEDILOL 12.5 MG PO TABS
12.5000 mg | ORAL_TABLET | Freq: Two times a day (BID) | ORAL | Status: DC
  Administered 2014-09-30 – 2014-10-04 (×8): 12.5 mg via ORAL
  Filled 2014-09-30 (×8): qty 1

## 2014-09-30 MED ORDER — PIPERACILLIN-TAZOBACTAM 3.375 G IVPB
3.3750 g | Freq: Once | INTRAVENOUS | Status: AC
  Administered 2014-09-30: 3.375 g via INTRAVENOUS
  Filled 2014-09-30: qty 50

## 2014-09-30 MED ORDER — FUROSEMIDE 40 MG PO TABS
40.0000 mg | ORAL_TABLET | Freq: Two times a day (BID) | ORAL | Status: DC
  Administered 2014-09-30 – 2014-10-04 (×8): 40 mg via ORAL
  Filled 2014-09-30 (×8): qty 1

## 2014-09-30 MED ORDER — ACETAMINOPHEN 650 MG RE SUPP: 650.0000 mg | Freq: Four times a day (QID) | RECTAL | Status: DC | PRN

## 2014-09-30 NOTE — Consult Note (Signed)
Patient Demographics  Raymond [name], is a 49 y.o. male   MRN: [id number]   DOB - [birth year]  Admit Date - 09/30/2014    Outpatient Primary MD for the patient is Tomasita Morrow, MD  Consult requested in the Hospital by Henreitta Leber, MD, On 09/30/2014    Reason for consult diabetic wounds to previous amputation sites of both great toes. Patient states the wounds have been present for several months. Had amputation several years ago. Because of his neuropathy pretty much ignored the wounds. Starting to see some foul odor from the right foot and also noted redness and swelling to the foot. Came to the emergency room at that time frame. Was admitted today.   With History of -  Past Medical History  Diagnosis Date  . Diabetes   . Hypertension   . Morbid obesity   . Systolic heart failure     Preserved EF 50-55%  . Chronic respiratory failure with hypoxia     2 L [place] continuously  . OSA on CPAP   . Arthritis     hands  . Cellulitis   . PVD (peripheral vascular disease)   . Hepatic steatosis       Past Surgical History  Procedure Laterality Date  . Eye surgery Left     bilat laser  . 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 Toe amputation Right 2009  . Cholecystectomy    . Hip surgery Right     8 th grade pins    in for   Chief Complaint  Patient presents with  . Wound Infection  . Foot Pain     HPI  Raymond [name]  is a 49 y.o. male  Patient states the wounds have been present for several months. Had amputation several years ago. Because of his neuropathy pretty much ignored the wounds. Starting to notice some foul odor from the right foot and also noted redness and swelling to the foot. Came to the emergency room at that time frame. Was admitted today. Long-term history of diabetes and  diabetic neuropathy with previous gangrenous changes to both great toes.      Review of Systems    In addition to the HPI above,  No Fever-chills, No Headache, No changes with Vision or hearing, No problems swallowing food or Liquids, No Chest pain, Cough or Shortness of Breath, No Abdominal pain, No Nausea or Vommitting, Bowel movements are regular, No Blood in stool or Urine, No dysuria, No new skin rashes or bruises, No new joints pains-aches,  No new weakness, tingling, numbness in any extremity, No recent weight gain or loss, No polyuria, polydypsia or polyphagia, No significant Mental Stressors.  A full 10 point Review of Systems was done, except as stated above, all other Review of Systems were negative.   Social History Social History  Substance Use Topics  . Smoking status: Never Smoker   . Smokeless tobacco: Never Used  . Alcohol Use: No  Family History Family History  Problem Relation Age of Onset  . Hypertension Mother   . Diabetes Mother   . Stroke Father   . Hypertension Father   . Heart attack Father     Prior to Admission medications   Medication Sig Start Date End Date Taking? Authorizing Provider  aspirin EC 81 MG tablet Take 81 mg by mouth daily.   Yes Historical Provider, MD  carvedilol (COREG) 12.5 MG tablet Take 12.5 mg by mouth 2 (two) times daily.    Yes Historical Provider, MD  enalapril (VASOTEC) 20 MG tablet Take 40 mg by mouth 2 (two) times daily.    Yes Historical Provider, MD  Fluticasone-Salmeterol (ADVAIR DISKUS) 500-50 MCG/DOSE AEPB Inhale 1 puff into the lungs 2 (two) times daily. Rinse and gargle after each use. Patient taking differently: Inhale 1 puff into the lungs 2 (two) times daily.  07/07/14  Yes Vishal Mungal, MD  furosemide (LASIX) 40 MG tablet Take 1 tablet (40 mg total) by mouth 2 (two) times daily as needed. Patient taking differently: Take 40 mg by mouth 2 (two) times daily as needed for edema.  05/06/14  Yes Minna Merritts, MD  gabapentin (NEURONTIN) 300 MG capsule Take 300 mg by mouth 3 (three) times daily.   Yes Historical Provider, MD  insulin detemir (LEVEMIR) 100 UNIT/ML injection Inject 75 Units into the skin 2 (two) times daily.    Yes Historical Provider, MD  insulin lispro (HUMALOG) 100 UNIT/ML cartridge Inject 60 Units into the skin 3 (three) times daily with meals.   Yes Historical Provider, MD  metFORMIN (GLUCOPHAGE) 1000 MG tablet Take 1,000 mg by mouth 2 (two) times daily with a meal.   Yes Historical Provider, MD  pantoprazole (PROTONIX) 40 MG tablet Take 40 mg by mouth daily.   Yes Historical Provider, MD  potassium chloride (MICRO-K) 10 MEQ CR capsule Take 10 mEq by mouth 2 (two) times daily.   Yes Historical Provider, MD    Anti-infectives    Start     Dose/Rate Route Frequency Ordered Stop   09/30/14 2200  piperacillin-tazobactam (ZOSYN) IVPB 4.5 g     4.5 g 25 mL/hr over 240 Minutes Intravenous 3 times per day 09/30/14 1752     09/30/14 2100  vancomycin (VANCOCIN) 1,500 mg in sodium chloride 0.9 % 500 mL IVPB     1,500 mg 250 mL/hr over 120 Minutes Intravenous Every 8 hours 09/30/14 1752     09/30/14 1415  vancomycin (VANCOCIN) IVPB 1000 mg/200 mL premix     1,000 mg 200 mL/hr over 60 Minutes Intravenous  Once 09/30/14 1401 09/30/14 1614   09/30/14 1400  piperacillin-tazobactam (ZOSYN) IVPB 3.375 g     3.375 g 12.5 mL/hr over 240 Minutes Intravenous  Once 09/30/14 1354        Scheduled Meds: . aspirin EC  81 mg Oral Daily  . carvedilol  12.5 mg Oral BID  . enalapril  40 mg Oral BID  . furosemide  40 mg Oral BID  . gabapentin  300 mg Oral TID  . heparin  5,000 Units Subcutaneous 3 times per day  . [START ON 10/01/2014] insulin aspart  60 Units Subcutaneous TID WC  . insulin detemir  75 Units Subcutaneous BID  . mometasone-formoterol  2 puff Inhalation BID  . pantoprazole  40 mg Oral Daily  . piperacillin-tazobactam (ZOSYN)  IV  3.375 g Intravenous Once  .  piperacillin-tazobactam  4.5 g Intravenous 3 times per day  .  potassium chloride  10 mEq Oral BID  . vancomycin  1,500 mg Intravenous Q8H   Continuous Infusions: . sodium chloride 100 mL/hr at 09/30/14 1826   PRN Meds:.acetaminophen **OR** acetaminophen, HYDROcodone-acetaminophen, morphine injection, ondansetron **OR** ondansetron (ZOFRAN) IV  No Known Allergies  Physical Exam  Vitals  Blood pressure 154/68, pulse 90, temperature 100.3 F (37.9 C), temperature source Oral, resp. rate 18, height 6\' 2"  (1.88 m), weight 167.831 kg (370 lb), SpO2 93 %.  Lower Extremity exam:  Vascular: Patient has good bit of swelling of the right foot pulses are difficult to palpate. Left foot can feel traced to the DP and PT pulses. Significant vascular disease likely. X-ray showed calcification to lower extremity vessels.  Dermatological: Right foot has an ulceration on the plantar stump to the right great toe amputation approximately 3 x 2.5 cm. An gangrenous necrotic core and base of the lesion is noted with significant odor. Undermining is noted to the region as well right foot has increased warmth and redness associated with it. Left foot shows an ulceration approximately 2 x 2 centimeters with a granular type base roundabout hyperkeratotic periphery. No evidence of infection undermining or tracking is noted on this area.  Neurological: Significant loss of neurologic status to both lower extremities.  Ortho: Previous amputation to both great toes with x-rays showing some presence of the base of the proximal phalanges.  Data Review  CBC  Recent Labs Lab 09/30/14 1245 09/30/14 1723  WBC 17.9* 16.8*  HGB 11.4* 11.0*  HCT 35.8* 34.8*  PLT 353 345  MCV 81.0 81.9  MCH 25.9* 25.9*  MCHC 31.9* 31.6*  RDW 15.0* 15.7*   ------------------------------------------------------------------------------------------------------------------  Chemistries   Recent Labs Lab 09/30/14 1245  09/30/14 1723  NA 136  --   K 4.3  --   CL 100*  --   CO2 29  --   GLUCOSE 195*  --   BUN 23*  --   CREATININE 1.41* 1.31*  CALCIUM 8.6*  --    ------------------------------------------------------------------------------------------------------------------ estimated creatinine clearance is 109.8 mL/min (by C-G formula based on Cr of 1.31). ------------------------------------------------------------------------------------------------------------------ No results for input(s): TSH, T4TOTAL, T3FREE, THYROIDAB in the last 72 hours.  Invalid input(s): FREET3   Coagulation profile No results for input(s): INR, PROTIME in the last 168 hours. ------------------------------------------------------------------------------------------------------------------- No results for input(s): DDIMER in the last 72 hours. -------------------------------------------------------------------------------------------------------------------  Cardiac Enzymes No results for input(s): CKMB, TROPONINI, MYOGLOBIN in the last 168 hours.  Invalid input(s): CK ------------------------------------------------------------------------------------------------------------------ Invalid input(s): POCBNP   ---------------------------------------------------------------------------------------------------------------  Urinalysis No results found for: COLORURINE, APPEARANCEUR, LABSPEC, PHURINE, GLUCOSEU, HGBUR, BILIRUBINUR, KETONESUR, PROTEINUR, UROBILINOGEN, NITRITE, LEUKOCYTESUR   Imaging results:   Dg Foot Complete Right  09/30/2014   CLINICAL DATA:  Foot ulcers  EXAM: RIGHT FOOT COMPLETE - 3+ VIEW  COMPARISON:  None.  FINDINGS: Frontal, oblique, and lateral views were obtained. The patient has had amputation at the level of the proximal aspect of the first proximal phalanx. There is soft tissue irregularity along the stump region of the amputation site consistent with ulceration. There is no bony destruction  apparent. No fracture or dislocation. There is generalized soft tissue swelling. There is extensive arterial vascular calcification.  IMPRESSION: Ulceration at the site of prior amputation of the first digit. No osteomyelitis is appreciable on this study. No fracture or dislocation. There is soft tissue swelling in extensive arterial vascular calcification.  If there remains concern for osteomyelitis, MR or nuclear medicine three-phase bone scan could be helpful to further assess.  Electronically Signed   By: Lowella Grip III M.D.   On: 09/30/2014 14:02        Assessment & Plan: Right foot has diabetic ulceration with gangrenous changes to the central portion of the wound significant drainage and likely deep penetration than infection. Left foot has a diabetic ulceration that is full-thickness through the skin but has a granular base with no evidence of spreading cellulitis or infection. Plan: I debrided some of the necrotic tissue from the right wound actually bled fairly well with that debridement. Left foot I debrided some the hyperkeratotic periphery. I have asked for a vascular consult and also await MRI results for tomorrow. Likely will need surgical intervention on the right foot to debride the infected tissue and possibly remove the involved bone and likely first ray amputation to allow for greater chance of closure and healing to the area. I await the vascular consult and the MRI results.  Active Problems:   Cellulitis of right foot     Family Communication: Plan discussed with patient in detail and he understands the severity of his problems and he has a at risk for limb loss on both feet. He understands his negligence in not seeking medical care in a timely fashion.   Thank you for the consult, we will follow the patient with you in the Hospital.   Perry Mount M.D on 09/30/2014 at 6:47 PM  Thank you for the consult, we will follow the patient with you in the Hospital.

## 2014-09-30 NOTE — Progress Notes (Signed)
ANTIBIOTIC CONSULT NOTE - INITIAL  Pharmacy Consult for Vancomycin/Zosyn Indication: Cellulitis  No Known Allergies  Patient Measurements: Height: 6\' 2"  (188 cm) Weight: (!) 370 lb (167.831 kg) IBW/kg (Calculated) : 82.2 Adjusted Body Weight: 116.4 kg  Vital Signs: Temp: 100.3 F (37.9 C) (08/30 1706) Temp Source: Oral (08/30 1706) BP: 154/68 mmHg (08/30 1706) Pulse Rate: 90 (08/30 1706) Intake/Output from previous day:   Intake/Output from this shift:    Labs:  Recent Labs  09/30/14 1245 09/30/14 1723  WBC 17.9* 16.8*  HGB 11.4* 11.0*  PLT 353 345  CREATININE 1.41*  --    Estimated Creatinine Clearance: 102 mL/min (by C-G formula based on Cr of 1.41). No results for input(s): VANCOTROUGH, VANCOPEAK, VANCORANDOM, GENTTROUGH, GENTPEAK, GENTRANDOM, TOBRATROUGH, TOBRAPEAK, TOBRARND, AMIKACINPEAK, AMIKACINTROU, AMIKACIN in the last 72 hours.   Kinetics:   Ke: 0.089 Vd: 81.5   Microbiology: Recent Results (from the past 720 hour(s))  Culture, blood (routine x 2)     Status: None (Preliminary result)   Collection Time: 09/30/14 12:45 PM  Result Value Ref Range Status   Specimen Description BLOOD  Final   Special Requests   Final    BOTTLES DRAWN AEROBIC AND ANAEROBIC 1CC AEROBIC,1CC ANAEROBIC   Culture NO GROWTH < 12 HOURS  Final   Report Status PENDING  Incomplete    Medical History: Past Medical History  Diagnosis Date  . Diabetes   . Hypertension   . Morbid obesity   . Systolic heart failure     Preserved EF 50-55%  . Chronic respiratory failure with hypoxia     2 L [place] continuously  . OSA on CPAP   . Arthritis     hands  . Cellulitis   . PVD (peripheral vascular disease)   . Hepatic steatosis     Medications:  Scheduled:  . aspirin EC  81 mg Oral Daily  . carvedilol  12.5 mg Oral BID  . enalapril  40 mg Oral BID  . furosemide  40 mg Oral BID  . gabapentin  300 mg Oral TID  . heparin  5,000 Units Subcutaneous 3 times per day  . [START ON  10/01/2014] insulin aspart  60 Units Subcutaneous TID WC  . insulin detemir  75 Units Subcutaneous BID  . mometasone-formoterol  2 puff Inhalation BID  . pantoprazole  40 mg Oral Daily  . piperacillin-tazobactam (ZOSYN)  IV  3.375 g Intravenous Once  . piperacillin-tazobactam  4.5 g Intravenous 3 times per day  . potassium chloride  10 mEq Oral BID  . vancomycin  1,500 mg Intravenous Q8H   Infusions:  . sodium chloride     PRN: acetaminophen **OR** acetaminophen, HYDROcodone-acetaminophen, morphine injection, ondansetron **OR** ondansetron (ZOFRAN) IV  Assessment: 49 y/o M ordered empiric abx for cellulitis r/o osteomyelitis of right foot.   Goal of Therapy:  Vancomycin trough level 15-20 mcg/ml  Plan:  1. Vancomycin 1000 mg iv once given in ED. Will initially shoot for trough of 15-20 mcg/ml until r/o osteomyelitis/abscess. Will order vancomyicn 1500 mg iv q 8 hours with stacked dosing and a trough with the 4th dose.   2. Zosyn 4.5 g EI q 8 hours starting 6 hours after initial Zosyn 3.375 g dose given in ED.   Will continue to follow renal function and culture results.   Ulice Dash D 09/30/2014,5:54 PM

## 2014-09-30 NOTE — ED Notes (Signed)
Patient to ED with report of probable infection to right foot, with sores in two different areas. Patient reports he has had 2 amputations in the past and these areas have been there for "a while". Patient also reports he is unable to bear weight on the foot at this time.

## 2014-09-30 NOTE — ED Notes (Addendum)
Pt has dressings noted to bottom of both feet. Drainage noted from both dressings.

## 2014-09-30 NOTE — ED Provider Notes (Signed)
Time Seen: Approximately 1340  I have reviewed the triage notes  Chief Complaint: Wound Infection and Foot Pain   History of Present Illness: Raymond [name] is a 49 y.o. male who presents within infections, previous history of previous amputations. Patient has a known history of diabetes and renal insufficiency. He states he's noticed increased pain and swelling especially in his right leg over the last 24-36 hours. Patient is not aware of any fever at home. He denies any nausea or vomiting, chest pain, productive cough or wheezing. He's noticed a foul odor from his right foot. She's noticed some mild discomfort toward his right groin area. He denies any back or flank discomfort. He is not aware of any obvious trauma.   Past Medical History  Diagnosis Date  . Diabetes   . Hypertension   . Morbid obesity   . Systolic heart failure     Preserved EF 50-55%  . Chronic respiratory failure with hypoxia     2 L [place] continuously  . OSA on CPAP   . Arthritis     hands  . Cellulitis   . PVD (peripheral vascular disease)   . Hepatic steatosis     Patient Active Problem List   Diagnosis Date Noted  . COPD, moderate 07/07/2014  . Chronic diastolic heart failure 42/35/3614  . Dyspnea 04/28/2014  . OSA treated with Trilogy machine 04/28/2014  . Hypoxemia requiring supplemental oxygen 04/28/2014  . Morbid obesity 04/28/2014    Past Surgical History  Procedure Laterality Date  . Eye surgery Left     bilat laser  . 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 Toe amputation Right 2009  . Cholecystectomy    . Hip surgery Right     8 th grade pins    Past Surgical History  Procedure Laterality Date  . Eye surgery Left     bilat laser  . 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 Toe amputation Right 2009  . Cholecystectomy    . Hip surgery Right     8 th grade pins    Current Outpatient Rx  Name  Route  Sig  Dispense  Refill  . aspirin EC 81 MG tablet   Oral   Take 81 mg by  mouth daily.         . carvedilol (COREG) 12.5 MG tablet   Oral   Take 12.5 mg by mouth 2 (two) times daily.          . enalapril (VASOTEC) 20 MG tablet   Oral   Take 40 mg by mouth 2 (two) times daily.          . Fluticasone-Salmeterol (ADVAIR DISKUS) 500-50 MCG/DOSE AEPB   Inhalation   Inhale 1 puff into the lungs 2 (two) times daily. Rinse and gargle after each use. Patient taking differently: Inhale 1 puff into the lungs 2 (two) times daily.    60 each   2   . furosemide (LASIX) 40 MG tablet   Oral   Take 1 tablet (40 mg total) by mouth 2 (two) times daily as needed. Patient taking differently: Take 40 mg by mouth 2 (two) times daily as needed for edema.    60 tablet   11   . gabapentin (NEURONTIN) 300 MG capsule   Oral   Take 300 mg by mouth 3 (three) times daily.         . insulin detemir (LEVEMIR) 100 UNIT/ML injection  Subcutaneous   Inject 75 Units into the skin 2 (two) times daily.          . insulin lispro (HUMALOG) 100 UNIT/ML cartridge   Subcutaneous   Inject 60 Units into the skin 3 (three) times daily with meals.         . metFORMIN (GLUCOPHAGE) 1000 MG tablet   Oral   Take 1,000 mg by mouth 2 (two) times daily with a meal.         . pantoprazole (PROTONIX) 40 MG tablet   Oral   Take 40 mg by mouth daily.         . potassium chloride (MICRO-K) 10 MEQ CR capsule   Oral   Take 10 mEq by mouth 2 (two) times daily.           Allergies:  Review of patient's allergies indicates no known allergies.  Family History: Family History  Problem Relation Age of Onset  . Hypertension Mother   . Stroke Father   . Hypertension Father   . Diabetes Father     Social History: Social History  Substance Use Topics  . Smoking status: Never Smoker   . Smokeless tobacco: Never Used  . Alcohol Use: No     Review of Systems:   10 point review of systems was performed and was otherwise negative:  Constitutional: No fever Eyes: No  visual disturbances ENT: No sore throat, ear pain Cardiac: No chest pain Respiratory: No shortness of breath, wheezing, or stridor Abdomen: No abdominal pain, no vomiting, No diarrhea Endocrine: No weight loss, No night sweats Extremities: Extremity issues noticed in history and physical exam Skin: No rashes, easy bruising Neurologic: No focal weakness, trouble with speech or swollowing Urologic: No dysuria, Hematuria, or urinary frequency   Physical Exam:  ED Triage Vitals  Enc Vitals Group     BP 09/30/14 1224 118/67 mmHg     Pulse Rate 09/30/14 1224 82     Resp 09/30/14 1224 20     Temp 09/30/14 1224 98.5 F (36.9 C)     Temp Source 09/30/14 1224 Oral     SpO2 09/30/14 1224 94 %     Weight 09/30/14 1224 370 lb (167.831 kg)     Height 09/30/14 1224 6\' 2"  (1.88 m)     Head Cir --      Peak Flow --      Pain Score 09/30/14 1225 10     Pain Loc --      Pain Edu? --      Excl. in [place]? --     General: Awake , Alert , and Oriented times 3; GCS 15 Head: Normal cephalic , atraumatic Eyes: Pupils equal , round, reactive to light Nose/Throat: No nasal drainage, patent upper airway without erythema or exudate.  Neck: Supple, Full range of motion, No anterior adenopathy or palpable thyroid masses Lungs: Clear to ascultation without wheezes , rhonchi, or rales Heart: Regular rate, regular rhythm without murmurs , gallops , or rubs Abdomen: Soft, non tender without rebound, guarding , or rigidity; bowel sounds positive and symmetric in all 4 quadrants. No organomegaly .        Extremities: Both lower extremities show large approximately 4 cm circular diabetic foot ulcers and the base of both first digits. The digit on the left shows granulation tissue and good capillary refill. Close examination of the right foot shows a lot of erythema and swelling that seems to ascend circumferentially up the right foot.  Warm to the touch especially at the patient's second digit on his right foot.  Neurologic: normal ambulation, Motor symmetric without deficits, sensory intact Skin: warm, dry, no rashes   Labs:   All laboratory work was reviewed including any pertinent negatives or positives listed below:  Labs Reviewed  CBC - Abnormal; Notable for the following:    WBC 17.9 (*)    Hemoglobin 11.4 (*)    HCT 35.8 (*)    MCH 25.9 (*)    MCHC 31.9 (*)    RDW 15.0 (*)    All other components within normal limits  BASIC METABOLIC PANEL - Abnormal; Notable for the following:    Chloride 100 (*)    Glucose, Bld 195 (*)    BUN 23 (*)    Creatinine, Ser 1.41 (*)    Calcium 8.6 (*)    GFR calc non Af Amer 56 (*)    All other components within normal limits  CULTURE, BLOOD (ROUTINE X 2)  LACTIC ACID, PLASMA  LACTIC ACID, PLASMA    EKG: None  Radiology:     EXAM: RIGHT FOOT COMPLETE - 3+ VIEW  COMPARISON: None.  FINDINGS: Frontal, oblique, and lateral views were obtained. The patient has had amputation at the level of the proximal aspect of the first proximal phalanx. There is soft tissue irregularity along the stump region of the amputation site consistent with ulceration. There is no bony destruction apparent. No fracture or dislocation. There is generalized soft tissue swelling. There is extensive arterial vascular calcification.  IMPRESSION: Ulceration at the site of prior amputation of the first digit. No osteomyelitis is appreciable on this study. No fracture or dislocation. There is soft tissue swelling in extensive arterial vascular calcification.  If there remains concern for osteomyelitis, MR or nuclear medicine three-phase bone scan could be helpful to further assess    I personally reviewed the radiologic studies   Procedures: None  Critical Care: CRITICAL CARE Performed by: Daymon Larsen   Total critical care time: 33 minutes Initiation of IV antibiotics with sepsis and likely gangrene in the right foot Critical care time was exclusive  of separately billable procedures and treating other patients.  Critical care was necessary to treat or prevent imminent or life-threatening deterioration.  Critical care was time spent personally by me on the following activities: development of treatment plan with patient and/or surrogate as well as nursing, discussions with consultants, evaluation of patient's response to treatment, examination of patient, obtaining history from patient or surrogate, ordering and performing treatments and interventions, ordering and review of laboratory studies, ordering and review of radiographic studies, pulse oximetry and re-evaluation of patient's condition.     ED Course:  Patient's stay was uneventful establish IV access and blood cultures 2 were obtained. Lactic acid levels are pending. Patient has significantly elevated white blood cell count and smell the right foot of green or deep-seated infection in the right foot. X-rays do not show indications of osteomyelitis at this time. Patient was started on Zosyn and vancomycin IV here in emergency department.    Assessment:  Infected diabetic foot ulcer Sepsis   Final Clinical Impression:  Final diagnoses:  Foot ulcer due to secondary DM  Sepsis, due to unspecified organism     Plan: Inpatient management. I discussed case with the hospitalist team, further disposition and management depends upon their evaluation.          Go to top  Daymon Larsen, MD 09/30/14 1450

## 2014-09-30 NOTE — H&P (Signed)
[facility] NAME: Raymond [name]    MR#:  [id number]  DATE OF BIRTH:  12/24/1965  DATE OF ADMISSION:  09/30/2014  PRIMARY CARE PHYSICIAN: Tomasita Morrow, MD   REQUESTING/REFERRING PHYSICIAN: Dr. Meade Maw  CHIEF COMPLAINT:   Chief Complaint  Patient presents with  . Wound Infection  . Foot Pain    HISTORY OF PRESENT ILLNESS:  Raymond [name]  is a 49 y.o. male with a known history of diabetes, hypertension, obstructive sleep apnea, history of CHF, morbid obesity, peripheral vascular disease, who presents to the hospital due to right foot swelling, pain and also an ulcer on the right previously amputated area with foul-smelling discharge. Patient has had bilateral great toe amputations in the past. Patient has noted ulcers on both his previous areas of amputations but over the past week to 2 weeks he has had significant swelling and pain of his right foot and also foul-smelling discharge from the ulcer located in the right foot. Since his symptoms were not improving he came to the ER for further evaluation and was noted to have an acute right lower extremity cellulitis and possible underlying osteomyelitis. Patient admits to chills but no documented fever. He denies any chest pain, nausea, vomiting, diaphoresis, abdominal pain or any other associated symptoms presently. Hospitalist services were contacted for further treatment and evaluation.  PAST MEDICAL HISTORY:   Past Medical History  Diagnosis Date  . Diabetes   . Hypertension   . Morbid obesity   . Systolic heart failure     Preserved EF 50-55%  . Chronic respiratory failure with hypoxia     2 L [place] continuously  . OSA on CPAP   . Arthritis     hands  . Cellulitis   . PVD (peripheral vascular disease)   . Hepatic steatosis     PAST SURGICAL HISTORY:   Past Surgical History  Procedure Laterality Date  . Eye surgery Left     bilat laser  . Toe  amputation Left 2010    2cd  . Toe amputation Right 2009  . Cholecystectomy    . Hip surgery Right     8 th grade pins    SOCIAL HISTORY:   Social History  Substance Use Topics  . Smoking status: Never Smoker   . Smokeless tobacco: Never Used  . Alcohol Use: No    FAMILY HISTORY:   Family History  Problem Relation Age of Onset  . Hypertension Mother   . Diabetes Mother   . Stroke Father   . Hypertension Father   . Heart attack Father     DRUG ALLERGIES:  No Known Allergies  REVIEW OF SYSTEMS:   Review of Systems  Constitutional: Positive for chills. Negative for fever and weight loss.  HENT: Negative for congestion, nosebleeds and tinnitus.   Eyes: Negative for blurred vision, double vision and redness.  Respiratory: Negative for cough, hemoptysis and shortness of breath.   Cardiovascular: Negative for chest pain, orthopnea, leg swelling and PND.  Gastrointestinal: Negative for nausea, vomiting, abdominal pain, diarrhea and melena.  Genitourinary: Negative for dysuria, urgency and hematuria.  Musculoskeletal: Positive for joint pain (right foot). Negative for falls.  Neurological: Negative for dizziness, tingling, sensory change, focal weakness, seizures, weakness and headaches.  Endo/Heme/Allergies: Negative for polydipsia. Does not bruise/bleed easily.  Psychiatric/Behavioral: Negative for depression and memory loss. The patient is not nervous/anxious.     MEDICATIONS AT HOME:   Prior to Admission  medications   Medication Sig Start Date End Date Taking? Authorizing Provider  aspirin EC 81 MG tablet Take 81 mg by mouth daily.   Yes Historical Provider, MD  carvedilol (COREG) 12.5 MG tablet Take 12.5 mg by mouth 2 (two) times daily.    Yes Historical Provider, MD  enalapril (VASOTEC) 20 MG tablet Take 40 mg by mouth 2 (two) times daily.    Yes Historical Provider, MD  Fluticasone-Salmeterol (ADVAIR DISKUS) 500-50 MCG/DOSE AEPB Inhale 1 puff into the lungs 2 (two)  times daily. Rinse and gargle after each use. Patient taking differently: Inhale 1 puff into the lungs 2 (two) times daily.  07/07/14  Yes Vishal Mungal, MD  furosemide (LASIX) 40 MG tablet Take 1 tablet (40 mg total) by mouth 2 (two) times daily as needed. Patient taking differently: Take 40 mg by mouth 2 (two) times daily as needed for edema.  05/06/14  Yes Minna Merritts, MD  gabapentin (NEURONTIN) 300 MG capsule Take 300 mg by mouth 3 (three) times daily.   Yes Historical Provider, MD  insulin detemir (LEVEMIR) 100 UNIT/ML injection Inject 75 Units into the skin 2 (two) times daily.    Yes Historical Provider, MD  insulin lispro (HUMALOG) 100 UNIT/ML cartridge Inject 60 Units into the skin 3 (three) times daily with meals.   Yes Historical Provider, MD  metFORMIN (GLUCOPHAGE) 1000 MG tablet Take 1,000 mg by mouth 2 (two) times daily with a meal.   Yes Historical Provider, MD  pantoprazole (PROTONIX) 40 MG tablet Take 40 mg by mouth daily.   Yes Historical Provider, MD  potassium chloride (MICRO-K) 10 MEQ CR capsule Take 10 mEq by mouth 2 (two) times daily.   Yes Historical Provider, MD      VITAL SIGNS:  Blood pressure 154/67, pulse 76, temperature 98.6 F (37 C), temperature source Oral, resp. rate 19, height 6\' 2"  (1.88 m), weight 167.831 kg (370 lb), SpO2 93 %.  PHYSICAL EXAMINATION:  Physical Exam  GENERAL:  49 y.o.-year-old morbidly obese patient lying in the bed with no acute distress.  EYES: Pupils equal, round, reactive to light and accommodation. No scleral icterus. Extraocular muscles intact.  HEENT: Head atraumatic, normocephalic. Oropharynx and nasopharynx clear. No oropharyngeal erythema, moist oral mucosa  NECK:  Supple, no jugular venous distention. No thyroid enlargement, no tenderness.  LUNGS: Normal breath sounds bilaterally, no wheezing, rales, rhonchi. No use of accessory muscles of respiration.  CARDIOVASCULAR: S1, S2 RRR. No murmurs, rubs, gallops, clicks.  ABDOMEN:  Soft, nontender, nondistended. Bowel sounds present. No organomegaly or mass.  EXTREMITIES: +1 edema from the knees to the ankles bilaterally, no cyanosis, or clubbing. + 2 pedal & radial pulses b/l.   NEUROLOGIC: Cranial nerves II through XII are intact. No focal Motor or sensory deficits appreciated b/l PSYCHIATRIC: The patient is alert and oriented x 3. Good affect.  SKIN: No obvious rash, lesion.  Patient has a 2 x 3 cm ulcer on the right foot near his previous great toe amputation with Foul-smelling discharge.  Patient also has a 2 x 3 cm ulcer on the left foot near his previous great toe amputation. Patient has significant fluctuance and warmth to his right foot as compared to the left.  LABORATORY PANEL:   CBC  Recent Labs Lab 09/30/14 1245  WBC 17.9*  HGB 11.4*  HCT 35.8*  PLT 353   ------------------------------------------------------------------------------------------------------------------  Chemistries   Recent Labs Lab 09/30/14 1245  NA 136  K 4.3  CL 100*  CO2 29  GLUCOSE 195*  BUN 23*  CREATININE 1.41*  CALCIUM 8.6*   ------------------------------------------------------------------------------------------------------------------  Cardiac Enzymes No results for input(s): TROPONINI in the last 168 hours. ------------------------------------------------------------------------------------------------------------------  RADIOLOGY:  Dg Foot Complete Right  09/30/2014   CLINICAL DATA:  Foot ulcers  EXAM: RIGHT FOOT COMPLETE - 3+ VIEW  COMPARISON:  None.  FINDINGS: Frontal, oblique, and lateral views were obtained. The patient has had amputation at the level of the proximal aspect of the first proximal phalanx. There is soft tissue irregularity along the stump region of the amputation site consistent with ulceration. There is no bony destruction apparent. No fracture or dislocation. There is generalized soft tissue swelling. There is extensive arterial vascular  calcification.  IMPRESSION: Ulceration at the site of prior amputation of the first digit. No osteomyelitis is appreciable on this study. No fracture or dislocation. There is soft tissue swelling in extensive arterial vascular calcification.  If there remains concern for osteomyelitis, MR or nuclear medicine three-phase bone scan could be helpful to further assess.   Electronically Signed   By: Lowella Grip III M.D.   On: 09/30/2014 14:02     IMPRESSION AND PLAN:   49 year old male with past medical history of type 2 diabetes, peripheral asked disease, hypertension, obstructive sleep apnea, diabetic neuropathy, GERD who presents to the hospital with right foot pain and swelling and ulceration.  #1 right foot cellulitis/diabetic foot ulcer-likely cause of patient's redness swelling and pain. -There is concern for possible underlying osteomyelitis and therefore I'll get MRI of his right foot. -I will start treatment with IV antibiotics with vancomycin/Zosyn. -I will get a vascular surgery, podiatry consult. -Continue supportive care with pain medications and IV antibiotics.  #2 leukocytosis-likely in the setting of the acute infection as mentioned above. -Follow white cell count.  #3 type 2 diabetes without complication-continue Levemir, NovoLog with meals. -I will check a hemoglobin A1c.  #4 hypertension-continue Coreg, enalapril.  #5 GERD-continue Protonix.  #6 diabetic neuropathy-continue Neurontin.    All the records are reviewed and case discussed with ED provider. Management plans discussed with the patient, family and they are in agreement.  CODE STATUS: Full  TOTAL TIME TAKING CARE OF THIS PATIENT: 50 minutes.    Henreitta Leber M.D on 09/30/2014 at 3:49 PM  Between 7am to 6pm - Pager - [phone]  After 6pm go to [email or web address] - password [email or web address] [facility]  [facility]  Office  [phone]  CC: Primary care physician; Tomasita Morrow, MD

## 2014-10-01 ENCOUNTER — Inpatient Hospital Stay: Payer: Medicare PPO

## 2014-10-01 LAB — CBC
HCT: 32.8 % — ABNORMAL LOW (ref 40.0–52.0)
Hemoglobin: 10.7 g/dL — ABNORMAL LOW (ref 13.0–18.0)
MCH: 26.5 pg (ref 26.0–34.0)
MCHC: 32.6 g/dL (ref 32.0–36.0)
MCV: 81.2 fL (ref 80.0–100.0)
Platelets: 323 10*3/uL (ref 150–440)
RBC: 4.04 MIL/uL — ABNORMAL LOW (ref 4.40–5.90)
RDW: 15 % — ABNORMAL HIGH (ref 11.5–14.5)
WBC: 14.6 10*3/uL — ABNORMAL HIGH (ref 3.8–10.6)

## 2014-10-01 LAB — VANCOMYCIN, TROUGH: Vancomycin Tr: 19 ug/mL (ref 10–20)

## 2014-10-01 LAB — BASIC METABOLIC PANEL
Anion gap: 6 (ref 5–15)
BUN: 17 mg/dL (ref 6–20)
CO2: 28 mmol/L (ref 22–32)
Calcium: 8.3 mg/dL — ABNORMAL LOW (ref 8.9–10.3)
Chloride: 104 mmol/L (ref 101–111)
Creatinine, Ser: 1.18 mg/dL (ref 0.61–1.24)
GFR calc Af Amer: >60 mL/min (ref >60)
GFR calc non Af Amer: >60 mL/min (ref >60)
Glucose, Bld: 211 mg/dL — ABNORMAL HIGH (ref 65–99)
Potassium: 4.3 mmol/L (ref 3.5–5.1)
Sodium: 138 mmol/L (ref 135–145)

## 2014-10-01 LAB — HEMOGLOBIN A1C: Hgb A1c MFr Bld: 6.6 % — ABNORMAL HIGH (ref 4.0–6.0)

## 2014-10-01 LAB — GLUCOSE, CAPILLARY
Glucose-Capillary: 213 mg/dL — ABNORMAL HIGH (ref 65–99)
Glucose-Capillary: 230 mg/dL — ABNORMAL HIGH (ref 65–99)
Glucose-Capillary: 99 mg/dL (ref 65–99)
Glucose-Capillary: 116 mg/dL — ABNORMAL HIGH (ref 65–99)

## 2014-10-01 MED ORDER — INSULIN ASPART 100 UNIT/ML ~~LOC~~ SOLN
0.0000 [IU] | Freq: Every day | SUBCUTANEOUS | Status: DC
  Administered 2014-10-02: 2 [IU] via SUBCUTANEOUS
  Filled 2014-10-01: qty 2

## 2014-10-01 MED ORDER — INSULIN ASPART 100 UNIT/ML ~~LOC~~ SOLN
0.0000 [IU] | Freq: Three times a day (TID) | SUBCUTANEOUS | Status: DC
  Administered 2014-10-03: 7 [IU] via SUBCUTANEOUS
  Administered 2014-10-03 (×2): 4 [IU] via SUBCUTANEOUS
  Administered 2014-10-04: 7 [IU] via SUBCUTANEOUS
  Filled 2014-10-01 (×2): qty 7
  Filled 2014-10-01 (×2): qty 4

## 2014-10-01 MED ORDER — GADOBENATE DIMEGLUMINE 529 MG/ML IV SOLN
20.0000 mL | Freq: Once | INTRAVENOUS | Status: AC | PRN
  Administered 2014-10-01: 20 mL via INTRAVENOUS

## 2014-10-01 NOTE — Progress Notes (Signed)
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  I will surgically debride tomorrow and likely remove 1st met distally to increase healing potential.

## 2014-10-01 NOTE — Consult Note (Signed)
[name] Vascular Consult Note  MRN : [id number]  Raymond [name] is a 49 y.o. ([birth year]) male who presents with chief complaint of  Chief Complaint  Patient presents with  . Wound Infection  . Foot Pain  .  History of Present Illness: Patient is a 49 year old male who presents with ulcerations on both lower extremities. He has an extensive history of lower extremity wounds and problems with the right great toe amputation and a left great and second toe amputation about 6-8 years ago. He has significant neuropathy and does not really feel his feet very well and apparently has had problems with ulceration on both feet for some time. The right one has become infected and has been draining purulent, foul-smelling material. This is progressed over a couple of weeks' time. His left foot has a chronic ulcer as well, but does not appear overly affected at this time. He presented to the hospital yesterday and was admitted. He was seen by the podiatry service and an MRI was performed which showed osteomyelitis in the first metatarsal head on the right that were require surgical debridement. He has swelling in both lower extremities a little worse on the right than the left. His pulses were not easily palpable and calcifications were seen on plain x-rays which would suggest significant vascular disease. He does not know the last time he had his circulation checked. He has discomfort from neuropathy, but not a lot of pain otherwise. He has had some low-grade fevers but no signs of sepsis. He is scheduled for surgical debridement of his right foot tomorrow, but prior to any surgical intervention a vascular evaluation was requested.  Current Facility-Administered Medications  Medication Dose Route Frequency Provider Last Rate Last Dose  . acetaminophen (TYLENOL) tablet 650 mg  650 mg Oral Q6H PRN Henreitta Leber, MD       Or  . acetaminophen (TYLENOL) suppository 650 mg   650 mg Rectal Q6H PRN Henreitta Leber, MD      . aspirin EC tablet 81 mg  81 mg Oral Daily Henreitta Leber, MD   81 mg at 10/01/14 1114  . carvedilol (COREG) tablet 12.5 mg  12.5 mg Oral BID Henreitta Leber, MD   12.5 mg at 10/01/14 1114  . enalapril (VASOTEC) tablet 40 mg  40 mg Oral BID Henreitta Leber, MD   40 mg at 10/01/14 1113  . furosemide (LASIX) tablet 40 mg  40 mg Oral BID Henreitta Leber, MD   40 mg at 10/01/14 0834  . gabapentin (NEURONTIN) capsule 300 mg  300 mg Oral TID Henreitta Leber, MD   300 mg at 10/01/14 1113  . heparin injection 5,000 Units  5,000 Units Subcutaneous 3 times per day Henreitta Leber, MD   5,000 Units at 10/01/14 1420  . HYDROcodone-acetaminophen (NORCO/VICODIN) 5-325 MG per tablet 1-2 tablet  1-2 tablet Oral Q4H PRN Henreitta Leber, MD      . insulin aspart (novoLOG) injection 0-20 Units  0-20 Units Subcutaneous TID WC Sital Mody, MD      . insulin aspart (novoLOG) injection 0-5 Units  0-5 Units Subcutaneous QHS Sital Mody, MD      . insulin aspart (novoLOG) injection 60 Units  60 Units Subcutaneous TID WC Henreitta Leber, MD   60 Units at 10/01/14 1147  . insulin detemir (LEVEMIR) injection 75 Units  75 Units Subcutaneous BID Henreitta Leber, MD   75 Units at 10/01/14  1113  . mometasone-formoterol (DULERA) 200-5 MCG/ACT inhaler 2 puff  2 puff Inhalation BID Henreitta Leber, MD   2 puff at 10/01/14 1112  . morphine 2 MG/ML injection 2 mg  2 mg Intravenous Q4H PRN Henreitta Leber, MD   2 mg at 09/30/14 2046  . ondansetron (ZOFRAN) tablet 4 mg  4 mg Oral Q6H PRN Henreitta Leber, MD       Or  . ondansetron (ZOFRAN) injection 4 mg  4 mg Intravenous Q6H PRN Henreitta Leber, MD      . pantoprazole (PROTONIX) EC tablet 40 mg  40 mg Oral Daily Henreitta Leber, MD   40 mg at 10/01/14 1114  . piperacillin-tazobactam (ZOSYN) IVPB 4.5 g  4.5 g Intravenous 3 times per day Henreitta Leber, MD   4.5 g at 10/01/14 1420  . potassium chloride (K-DUR,KLOR-CON) CR tablet 10 mEq   10 mEq Oral BID Henreitta Leber, MD   10 mEq at 10/01/14 1114  . vancomycin (VANCOCIN) 1,500 mg in sodium chloride 0.9 % 500 mL IVPB  1,500 mg Intravenous Q8H Henreitta Leber, MD   1,500 mg at 10/01/14 1308    Past Medical History  Diagnosis Date  . Diabetes   . Hypertension   . Morbid obesity   . Systolic heart failure     Preserved EF 50-55%  . Chronic respiratory failure with hypoxia     2 L [place] continuously  . OSA on CPAP   . Arthritis     hands  . Cellulitis   . PVD (peripheral vascular disease)   . Hepatic steatosis     Past Surgical History  Procedure Laterality Date  . Eye surgery Left     bilat laser  . 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 Toe amputation Right 2009  . Cholecystectomy    . Hip surgery Right     8 th grade pins    Social History Social History  Substance Use Topics  . Smoking status: Never Smoker   . Smokeless tobacco: Never Used  . Alcohol Use: No  No IVDU  Family History Family History  Problem Relation Age of Onset  . Hypertension Mother   . Diabetes Mother   . Stroke Father   . Hypertension Father   . Heart attack Father   no bleeding disorders, clotting disorders, or autoimmune diseases  No Known Allergies   REVIEW OF SYSTEMS (Negative unless checked)  Constitutional: [] Weight loss  [] Fever  [] Chills Cardiac: [] Chest pain   [] Chest pressure   [] Palpitations   [] Shortness of breath when laying flat   [] Shortness of breath at rest   [] Shortness of breath with exertion. Vascular:  [] Pain in legs with walking   [] Pain in legs at rest   [] Pain in legs when laying flat   [] Claudication   [] Pain in feet when walking  [] Pain in feet at rest  [] Pain in feet when laying flat   [] History of DVT   [] Phlebitis   [x] Swelling in legs   [] Varicose veins   [x] Non-healing ulcers Pulmonary:   [] Uses home oxygen   [] Productive cough   [] Hemoptysis   [] Wheeze  [] COPD   [] Asthma Neurologic:  [] Dizziness  [] Blackouts   [] Seizures   [] History of stroke    [] History of TIA  [] Aphasia   [] Temporary blindness   [] Dysphagia   [] Weakness or numbness in arms   [x] Weakness or numbness in legs Musculoskeletal:  [] Arthritis   [  x]Joint swelling   [] Joint pain   [] Low back pain Hematologic:  [] Easy bruising  [] Easy bleeding   [] Hypercoagulable state   [] Anemic  [] Hepatitis Gastrointestinal:  [] Blood in stool   [] Vomiting blood  [] Gastroesophageal reflux/heartburn   [] Difficulty swallowing. Genitourinary:  [] Chronic kidney disease   [] Difficult urination  [] Frequent urination  [] Burning with urination   [] Blood in urine Skin:  [] Rashes   [x] Ulcers   [x] Wounds Psychological:  [] History of anxiety   []  History of major depression.  Physical Examination  Filed Vitals:   10/01/14 0358 10/01/14 0736 10/01/14 1119 10/01/14 1123  BP: 129/57 127/60  147/58  Pulse: 70 74 75 73  Temp: 98.3 F (36.8 C) 98.2 F (36.8 C) 98.4 F (36.9 C)   TempSrc: Oral Oral Oral   Resp: 20  18   Height:      Weight:      SpO2: 98% 98% 96%    Body mass index is 47.49 kg/(m^2). Gen:  WD/WN, NAD. obese Head: [place]/AT, No temporalis wasting. Prominent temp pulse not noted. Ear/Nose/Throat: Hearing grossly intact, nares w/o erythema or drainage, oropharynx w/o Erythema/Exudate Eyes: PERRLA, EOMI.  Neck: Supple, no nuchal rigidity.  No bruit or JVD.  Pulmonary:  Good air movement, clear to auscultation bilaterally.  Cardiac: RRR, normal S1, S2, no Murmurs, rubs or gallops. Vascular:  Vessel Right Left  Radial Palpable Palpable  Ulnar Palpable Palpable  Brachial Palpable Palpable  Carotid Palpable, without bruit Palpable, without bruit  Aorta Not palpable N/A  Femoral Palpable Palpable  Popliteal Not Palpable Not Palpable  PT Not Palpable Not Palpable  DP Not Palpable Weakly Palpable   Gastrointestinal: soft, non-tender/non-distended. No guarding/reflex. No masses, surgical incisions, or scars. Musculoskeletal: M/S 5/5 throughout.  No deformity or atrophy. 2+ RLE edema,  1-2+ LLE edema. Neurologic: CN 2-12 intact. LE neuropathy is present with diminished sensation.  Symmetrical.  Speech is fluent. Motor exam as listed above. Psychiatric: Judgment intact, Mood & affect appropriate for pt's clinical situation. Dermatologic: open wounds on the areas of the previous great toe amputations bilaterally.  Mild drainage and erythema. Lymph : No Cervical, Axillary, or Inguinal lymphadenopathy.    CBC Lab Results  Component Value Date   WBC 14.6* 10/01/2014   HGB 10.7* 10/01/2014   HCT 32.8* 10/01/2014   MCV 81.2 10/01/2014   PLT 323 10/01/2014    BMET    Component Value Date/Time   NA 138 10/01/2014 0653   NA 142 05/02/2011 1353   K 4.3 10/01/2014 0653   K 4.2 05/02/2011 1353   CL 104 10/01/2014 0653   CL 104 05/02/2011 1353   CO2 28 10/01/2014 0653   CO2 28 05/02/2011 1353   GLUCOSE 211* 10/01/2014 0653   GLUCOSE 179* 05/02/2011 1353   BUN 17 10/01/2014 0653   BUN 19* 05/02/2011 1353   CREATININE 1.18 10/01/2014 0653   CREATININE 1.14 10/10/2011 1743   CALCIUM 8.3* 10/01/2014 0653   CALCIUM 8.8 05/02/2011 1353   GFRNONAA >60 10/01/2014 0653   GFRNONAA >60 10/10/2011 1743   GFRNONAA >60 05/02/2011 1353   GFRAA >60 10/01/2014 0653   GFRAA >60 10/10/2011 1743   GFRAA >60 05/02/2011 1353   Estimated Creatinine Clearance: 121.9 mL/min (by C-G formula based on Cr of 1.18).  COAG No results found for: INR, PROTIME  Radiology Mr Foot Right W Wo Contrast  10/01/2014   CLINICAL DATA:  Nonhealing wound at site of previous great toe amputation with swelling and discharge for 2 or 3  months. History of great toe amputation in 2012. Initial encounter.  EXAM: MRI OF THE RIGHT FOREFOOT WITHOUT AND WITH CONTRAST  TECHNIQUE: Multiplanar, multisequence MR imaging was performed both before and after administration of intravenous contrast.  CONTRAST:  19mL MULTIHANCE GADOBENATE DIMEGLUMINE 529 MG/ML IV SOLN  COMPARISON:  Radiographs 09/30/2014.  FINDINGS: Patient  is status post amputation of the great toe through the base of the proximal phalanx. The nonhealing ulcer is noted along the medial plantar aspect of the first metatarsal phalangeal joint. There is a sinus tract extending to the tibial sesamoid, and likely into the joint. There is synovial enhancement at the first MTP joint. There is also irregular soft tissue enhancement surrounding the joint with a small fluid collection tracking proximally along the flexor hallucis brevis muscle. No significant fluid is present within the flexor hallucis longus tendon sheath. There is an irregular 3 cm area of decreased enhancement within the first web space without corresponding well-defined fluid collection on the T2 weighted images. This likely represents an area of soft tissue devitalization and/or inflammation.  There is peripheral enhancement of this sinus tract. The tibial sesamoid demonstrates cortical destruction and replaced normal T1 signal. Post-contrast, there is diffuse enhancement of the tibial sesamoid. The fibular sesamoid and remaining base of the proximal phalanx demonstrate nonspecific low level T2 hyperintensity and enhancement without cortical destruction. The head of the first metatarsal demonstrates no significant findings.  The other toes and metatarsals appear normal. No significant arthropathic changes are present within the midfoot. There is generalized muscular T2 hyperintensity attributed to diabetes.  IMPRESSION: 1. Sinus tract associated with the nonhealing wound along the plantar aspect of the first MTP joint which extends to the tibial sesamoid and likely into the first metatarsal phalangeal joint. 2. There is associated osteomyelitis of the tibial sesamoid. There is possible early osteomyelitis of the fibular sesamoid and remaining base of the proximal phalanx. 3. Heterogeneous soft tissue enhancement surrounding the first MTP joint with probable small abscesses or areas of devascularization.    Electronically Signed   By: Richardean Sale M.D.   On: 10/01/2014 10:24   Dg Foot Complete Right  09/30/2014   CLINICAL DATA:  Foot ulcers  EXAM: RIGHT FOOT COMPLETE - 3+ VIEW  COMPARISON:  None.  FINDINGS: Frontal, oblique, and lateral views were obtained. The patient has had amputation at the level of the proximal aspect of the first proximal phalanx. There is soft tissue irregularity along the stump region of the amputation site consistent with ulceration. There is no bony destruction apparent. No fracture or dislocation. There is generalized soft tissue swelling. There is extensive arterial vascular calcification.  IMPRESSION: Ulceration at the site of prior amputation of the first digit. No osteomyelitis is appreciable on this study. No fracture or dislocation. There is soft tissue swelling in extensive arterial vascular calcification.  If there remains concern for osteomyelitis, MR or nuclear medicine three-phase bone scan could be helpful to further assess.   Electronically Signed   By: Lowella Grip III M.D.   On: 09/30/2014 14:02      Assessment/Plan 1. Ulceration BLE with suspected PAD clinically.  He has had previous issues with diabetic foot infections and has lost two toes on the left and one on the right.   2. Osteomyelitis right first metatarsal head. Scheduled for surgery to remove this area tomorrow afternoon. Any antibiotics. 3. Diabetes. Suboptimal control with significant neuropathy. Certainly complicates wound healing and infectious issues and is also a risk factor for  atherosclerosis. 4. Hypertension. Atherosclerotic risk factor. Stable.  On medications as an outpatient  Given the patient's clinical scenario and the lack of appropriate noninvasive studies at our institution, I discussed with him today that I would recommend proceeding with angiogram prior to his surgical debridement in the operating room tomorrow afternoon. I have discussed the risks and benefits of  angiography. I have discussed that if appropriate disease is seen at the time of the procedure, concomitant revascularization would be performed. We will concentrate on the right lower extremity tomorrow as this is the one with osteomyelitis in the worst infection. Further evaluation of his left lower extremity may be required in the future as well. The patient voices his understanding and agrees to proceed. He understands this is a severe limb threatening situation.   Spence Soberano, MD  10/01/2014 4:28 PM

## 2014-10-01 NOTE — Progress Notes (Signed)
[facility] [place] NAME: Raymond [name]    MR#:  [id number]  DATE OF BIRTH:  Jan 02, 1966  SUBJECTIVE:  No issues overnight. Patient underwent MRI scan which show evidence of osteomyelitis  REVIEW OF SYSTEMS:    Review of Systems  Constitutional: Negative for fever, chills and malaise/fatigue.  HENT: Negative for sore throat.   Eyes: Negative for blurred vision.  Respiratory: Negative for cough, hemoptysis, shortness of breath and wheezing.   Cardiovascular: Negative for chest pain, palpitations and leg swelling.  Gastrointestinal: Negative for nausea, vomiting, abdominal pain, diarrhea and blood in stool.  Genitourinary: Negative for dysuria.  Musculoskeletal: Negative for back pain.  Skin:       Bilateral foot ulcers  Neurological: Negative for dizziness, tremors and headaches.  Endo/Heme/Allergies: Does not bruise/bleed easily.    Tolerating Diet:yes      DRUG ALLERGIES:  No Known Allergies  VITALS:  Blood pressure 147/58, pulse 73, temperature 98.4 F (36.9 C), temperature source Oral, resp. rate 18, height 6\' 2"  (1.88 m), weight 167.831 kg (370 lb), SpO2 96 %.  PHYSICAL EXAMINATION:   Physical Exam  Constitutional: He is oriented to person, place, and time and well-developed, well-nourished, and in no distress. No distress.  HENT:  Head: Normocephalic.  Eyes: No scleral icterus.  Neck: Normal range of motion. Neck supple. No JVD present. No tracheal deviation present.  Cardiovascular: Normal rate, regular rhythm and normal heart sounds.  Exam reveals no gallop and no friction rub.   No murmur heard. Pulmonary/Chest: Effort normal and breath sounds normal. No respiratory distress. He has no wheezes. He has no rales. He exhibits no tenderness.  Abdominal: Soft. Bowel sounds are normal. He exhibits no distension and no mass. There is no tenderness. There is no rebound and no guarding.  Musculoskeletal: Normal  range of motion. He exhibits no edema.  Neurological: He is alert and oriented to person, place, and time.  Skin: Skin is warm. No rash noted. No erythema.  Both feet are wrapped in Kerlix dressing  Psychiatric: Affect and judgment normal.      LABORATORY PANEL:   CBC  Recent Labs Lab 10/01/14 0653  WBC 14.6*  HGB 10.7*  HCT 32.8*  PLT 323   ------------------------------------------------------------------------------------------------------------------  Chemistries   Recent Labs Lab 10/01/14 0653  NA 138  K 4.3  CL 104  CO2 28  GLUCOSE 211*  BUN 17  CREATININE 1.18  CALCIUM 8.3*   ------------------------------------------------------------------------------------------------------------------  Cardiac Enzymes No results for input(s): TROPONINI in the last 168 hours. ------------------------------------------------------------------------------------------------------------------  RADIOLOGY:  Mr Foot Right W Wo Contrast  10/01/2014  MPRESSION: 1. Sinus tract associated with the nonhealing wound along the plantar aspect of the first MTP joint which extends to the tibial sesamoid and likely into the first metatarsal phalangeal joint. 2. There is associated osteomyelitis of the tibial sesamoid. There is possible early osteomyelitis of the fibular sesamoid and remaining base of the proximal phalanx. 3. Heterogeneous soft tissue enhancement surrounding the first MTP joint with probable small abscesses or areas of devascularization.   Electronically Signed   By: Richardean Sale M.D.   On: 10/01/2014 10:24       ASSESSMENT AND PLAN:   49 year old male with diabetes and history of previous amputation of both great toes who presented with foul odor from right foot and redness.  1. Osteomyelitis of right foot, tibial sesamoid and possible fibular sesamoid and proximal phalanx: Patient was seen and evaluated by  podiatry. Patient underwent debridement. Patient will likely  need to go to the OR for further management of the osteomyelitis. Continue broad-spectrum antibiotics with Zosyn and vancomycin. Patient may need ID consultation after reevaluation by podiatry. Vascular surgery was also consulted. Blood cultures are negative to date.  2. 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 A1c is 6.6.  3. Essential hypertension: Continue Coreg and enalapril. Blood pressure is acceptable.  4. Acute kidney injury: Creatinine is improved with IV fluids. I will discontinue IV fluids due to history of systolic heart failure.  5. OSA: Continue CPAP.  6. Systolic heart failure: Patient does not appear to be in exacerbation at this time. Continue to monitor.       Management plans discussed with the patient and he is in agreement.  CODE STATUS: full  TOTAL TIME TAKING CARE OF THIS PATIENT: 30 minutes.     POSSIBLE D/C 4-5 days, DEPENDING ON CLINICAL CONDITION.   Vi Biddinger M.D on 10/01/2014 at 11:59 AM  Between 7am to 6pm - Pager - [phone] After 6pm go to [email or web address] - password [email or web address] [facility]  [facility]  Office  [phone]  CC: Primary care physician; Tomasita Morrow, MD

## 2014-10-01 NOTE — Clinical Social Work Placement (Signed)
   CLINICAL SOCIAL WORK PLACEMENT  NOTE  Date:  10/01/2014  Patient Details  Name: Raymond [name] [facility] MRN: [id number] Date of Birth: 01/11/1966  Clinical Social Work is seeking post-discharge placement for this patient at the [place] level of care (*CSW will initial, date and re-position this form in  chart as items are completed):  Yes   Patient/family provided with [facility] Work Department's list of facilities offering this level of care within the geographic area requested by the patient (or if unable, by the patient's family).  Yes   Patient/family informed of their freedom to choose among providers that offer the needed level of care, that participate in Medicare, Medicaid or managed care program needed by the patient, have an available bed and are willing to accept the patient.  Yes   Patient/family informed of [facility]'s ownership interest in [facility] and [facility], as well as of the fact that they are under no obligation to receive care at these facilities.  PASRR submitted to EDS on 10/01/14     PASRR number received on 10/01/14     Existing PASRR number confirmed on       FL2 transmitted to all facilities in geographic area requested by pt/family on 10/01/14     FL2 transmitted to all facilities within larger geographic area on       Patient informed that his/her managed care company has contracts with or will negotiate with certain facilities, including the following:        Yes   Patient/family informed of bed offers received.  Patient chooses bed at  [facility] )     Physician recommends and patient chooses bed at      Patient to be transferred to   on  .  Patient to be transferred to facility by       Patient family notified on   of transfer.  Name of family member notified:        PHYSICIAN Please sign FL2     Additional Comment:     _______________________________________________ Loralyn Freshwater, LCSW 10/01/2014, 5:11 PM

## 2014-10-01 NOTE — Clinical Social Work Note (Signed)
Clinical Social Work Assessment  Patient Details  Name: Raymond [name] MRN: [id number] Date of Birth: 08/13/1965  Date of referral:  10/01/14               Reason for consult:  Facility Placement                Permission sought to share information with:  Facility Contact Representative Permission granted to share information::  Yes, Verbal Permission Granted  Name::      [facility]  Agency::   Skilled Nursing Facility   Relationship::     Contact Information:     Housing/Transportation Living arrangements for the past 2 months:  Single Family Home Source of Information:  Patient Patient Interpreter Needed:  None Criminal Activity/Legal Involvement Pertinent to Current Situation/Hospitalization:  No - Comment as needed Significant Relationships:  Friend Lives with:  Roommate Do you feel safe going back to the place where you live?  Yes Need for family participation in patient care:  No (Coment)  Care giving concerns: Patient lives in [place] with his roommate Raymond [name].    Social Worker assessment / plan: Clinical Social Worker (CSW) received SNF consult for wound care. Patient is scheduled for surgery tomorrow. CSW met with patient alone at bedside. Patient was alert and oriented. Patient reported that he lives in [place] with his roommate Raymond. CSW explained that patient may need wound care and IV antibiotics. CSW explained SNF process that [employer] requires pre-authorization. Patient is agreeable to SNF search in [place]. Patient is agreeable to going home if [employer] denies his SNF stay. Patient reported that he has a trilogy at home that he can bring to SNF.   CSW presented bed offers to patient. He chose [facility]. Teresa admissions coordinator at [facility] started [employer] authorization today.   Employment status:  Disabled (Comment on whether or not currently receiving Disability) Insurance information:  Managed  Medicare PT Recommendations:  Not assessed at this time Information / Referral to community resources:     Patient/Family's Response to care: Patient is agreeable to going to [facility].   Patient/Family's Understanding of and Emotional Response to Diagnosis, Current Treatment, and Prognosis: Patient was pleasant throughout assessment and thanked CSW for visit.  Emotional Assessment Appearance:  Appears stated age Attitude/Demeanor/Rapport:    Affect (typically observed):  Accepting, Adaptable, Pleasant Orientation:  Oriented to Self, Oriented to Place, Oriented to  Time Alcohol / Substance use:  Not Applicable Psych involvement (Current and /or in the community):  No (Comment)  Discharge Needs  Concerns to be addressed:  Discharge Planning Concerns Readmission within the last 30 days:  No Current discharge risk:  Chronically ill Barriers to Discharge:  Continued Medical Work up   Morgan, Bailey G, LCSW 10/01/2014, 5:12 PM  

## 2014-10-01 NOTE — Progress Notes (Signed)
Patient cooperative with care, no complaints.  Patient to have procedure tomorrow.  NPO after midnight.

## 2014-10-01 NOTE — Progress Notes (Signed)
ANTIBIOTIC CONSULT NOTE - INITIAL  Pharmacy Consult for Vancomycin/Zosyn Indication: Cellulitis  No Known Allergies  Patient Measurements: Height: 6\' 2"  (188 cm) Weight: (!) 370 lb (167.831 kg) IBW/kg (Calculated) : 82.2 Adjusted Body Weight: 116.4 kg  Vital Signs: Temp: 100.1 F (37.8 C) (08/31 2059) Temp Source: Oral (08/31 2059) BP: 144/58 mmHg (08/31 2059) Pulse Rate: 88 (08/31 2059) Intake/Output from previous day: 08/30 0701 - 08/31 0700 In: 2636.7 [P.O.:480; I.V.:956.7; IV Piggyback:1200] Out: 3340 [Urine:3340] Intake/Output from this shift:    Labs:  Recent Labs  09/30/14 1245 09/30/14 1723 10/01/14 0653  WBC 17.9* 16.8* 14.6*  HGB 11.4* 11.0* 10.7*  PLT 353 345 323  CREATININE 1.41* 1.31* 1.18   Estimated Creatinine Clearance: 121.9 mL/min (by C-G formula based on Cr of 1.18).  Recent Labs  10/01/14 2021  VANCOTROUGH 19     Kinetics:   Ke: 0.089 Vd: 81.5   Microbiology: Recent Results (from the past 720 hour(s))  Culture, blood (routine x 2)     Status: None (Preliminary result)   Collection Time: 09/30/14 12:45 PM  Result Value Ref Range Status   Specimen Description BLOOD  Final   Special Requests   Final    BOTTLES DRAWN AEROBIC AND ANAEROBIC 1CC AEROBIC,1CC ANAEROBIC   Culture NO GROWTH < 24 HOURS  Final   Report Status PENDING  Incomplete  Blood culture (routine x 2)     Status: None (Preliminary result)   Collection Time: 09/30/14  2:05 PM  Result Value Ref Range Status   Specimen Description BLOOD RIGHT ASSIST CONTROL  Final   Special Requests   Final    BOTTLES DRAWN AEROBIC AND ANAEROBIC  5 CC ANAERO 3 CC AERO   Culture NO GROWTH < 24 HOURS  Final   Report Status PENDING  Incomplete  Blood culture (routine x 2)     Status: None (Preliminary result)   Collection Time: 09/30/14  5:23 PM  Result Value Ref Range Status   Specimen Description BLOOD LEFT ARM  Final   Special Requests BOTTLES DRAWN AEROBIC AND ANAEROBIC 10CC  Final   Culture NO GROWTH < 24 HOURS  Final   Report Status PENDING  Incomplete    Medical History: Past Medical History  Diagnosis Date  . Diabetes   . Hypertension   . Morbid obesity   . Systolic heart failure     Preserved EF 50-55%  . Chronic respiratory failure with hypoxia     2 L [place] continuously  . OSA on CPAP   . Arthritis     hands  . Cellulitis   . PVD (peripheral vascular disease)   . Hepatic steatosis     Medications:  Scheduled:  . aspirin EC  81 mg Oral Daily  . carvedilol  12.5 mg Oral BID  . enalapril  40 mg Oral BID  . furosemide  40 mg Oral BID  . gabapentin  300 mg Oral TID  . heparin  5,000 Units Subcutaneous 3 times per day  . insulin aspart  0-20 Units Subcutaneous TID WC  . insulin aspart  0-5 Units Subcutaneous QHS  . insulin aspart  60 Units Subcutaneous TID WC  . insulin detemir  75 Units Subcutaneous BID  . mometasone-formoterol  2 puff Inhalation BID  . pantoprazole  40 mg Oral Daily  . piperacillin-tazobactam  4.5 g Intravenous 3 times per day  . potassium chloride  10 mEq Oral BID  . vancomycin  1,500 mg Intravenous Q8H  Infusions:    PRN: acetaminophen **OR** acetaminophen, HYDROcodone-acetaminophen, morphine injection, ondansetron **OR** ondansetron (ZOFRAN) IV  Assessment: 49 y/o M ordered empiric abx for cellulitis r/o osteomyelitis of right foot.   Goal of Therapy:  Vancomycin trough level 15-20 mcg/ml  Plan:  1. Vancomycin 1000 mg iv once given in ED. Will initially shoot for trough of 15-20 mcg/ml until r/o osteomyelitis/abscess. Will order vancomyicn 1500 mg iv q 8 hours with stacked dosing and a trough with the 4th dose.   2. Zosyn 4.5 g EI q 8 hours starting 6 hours after initial Zosyn 3.375 g dose given in ED.   Will continue to follow renal function and culture results.   8/31:   Vanc trough @ 20:30 = 19 mcg/mL Will continue this pt on current dose of Vancomycin 1500 mg IV Q8H.   Keisean Skowron D 10/01/2014,9:48 PM

## 2014-10-01 NOTE — Progress Notes (Signed)
Inpatient Diabetes Program Recommendations  AACE/ADA: New Consensus Statement on Inpatient Glycemic Control (2013)  Target Ranges:  Prepandial:   less than 140 mg/dL      Peak postprandial:   less than 180 mg/dL (1-2 hours)      Critically ill patients:  140 - 180 mg/dL   Results for KAPONO, [facility] (MRN [id number]) as of 10/01/2014 08:51  Ref. Range 09/30/2014 17:51 09/30/2014 21:08 10/01/2014 07:38  Glucose-Capillary Latest Ref Range: 65-99 mg/dL 171 (H) 267 (H) 213 (H)    Diabetes history: DM2 Outpatient Diabetes medications: Levemir 75 units BID, Humalog 60 units TID, Metformin 1000 BID Current orders for Inpatient glycemic control: Levemir 75 units BID, Novolog 60 units TID with meals  Inpatient Diabetes Program Recommendations Correction (SSI): While inpatient, please order CBGs with Novolog correction scale ACHS (in addition to Novolog 60 units TID with meals).  Thanks, Barnie Alderman, RN, MSN, CCRN, CDE Diabetes Coordinator Inpatient Diabetes Program [phone] (Team Pager from [place] to [place]) [phone] ([facility] office) [phone] [facility], [facility], [facility] office) [phone] [facility] office)

## 2014-10-01 NOTE — Care Management (Signed)
Patient admitted with right foot cellulitis/diabetic foot ulcer.  Patient lives at home with a roommate.  Patient states that he goes to the [facility], and obtains his medications there.  Patient states that financially he is able to obtain all of medications and has them all at home.  Patient has a cane at home and states that he uses it prn.  Patient has had home health services in the past with [place], and if indicated would like to utilize them again at the time of discharge.  RNCM to follow for discharge planning

## 2014-10-02 ENCOUNTER — Inpatient Hospital Stay: Payer: Medicare PPO | Admitting: Anesthesiology

## 2014-10-02 ENCOUNTER — Encounter
Admission: EM | Disposition: A | Discharge status: Skilled Nursing Facility | Payer: Self-pay | Source: Home / Self Care | Attending: Internal Medicine

## 2014-10-02 ENCOUNTER — Encounter: Payer: Self-pay | Admitting: *Deleted

## 2014-10-02 HISTORY — PX: AMPUTATION: SHX166

## 2014-10-02 HISTORY — PX: PERIPHERAL VASCULAR CATHETERIZATION: SHX172C

## 2014-10-02 LAB — GLUCOSE, CAPILLARY
Glucose-Capillary: 108 mg/dL — ABNORMAL HIGH (ref 65–99)
Glucose-Capillary: 202 mg/dL — ABNORMAL HIGH (ref 65–99)

## 2014-10-02 LAB — MRSA PCR SCREENING: MRSA by PCR: NEGATIVE

## 2014-10-02 SURGERY — LOWER EXTREMITY ANGIOGRAPHY
Anesthesia: Moderate Sedation | Laterality: Right

## 2014-10-02 SURGERY — AMPUTATION, FOOT, RAY
Anesthesia: Monitor Anesthesia Care | Laterality: Right | Wound class: Contaminated

## 2014-10-02 MED ORDER — BUPIVACAINE HCL 0.5 % IJ SOLN
INTRAMUSCULAR | Status: DC | PRN
  Administered 2014-10-02: 10 mL

## 2014-10-02 MED ORDER — GLYCOPYRROLATE 0.2 MG/ML IJ SOLN
INTRAMUSCULAR | Status: DC | PRN
  Administered 2014-10-02: 0.2 mg via INTRAVENOUS

## 2014-10-02 MED ORDER — LIDOCAINE-EPINEPHRINE (PF) 1 %-1:200000 IJ SOLN
INTRAMUSCULAR | Status: DC | PRN
  Administered 2014-10-02: 10 mL via INTRADERMAL

## 2014-10-02 MED ORDER — FENTANYL CITRATE (PF) 100 MCG/2ML IJ SOLN
INTRAMUSCULAR | Status: DC | PRN
  Administered 2014-10-02 (×2): 50 ug via INTRAVENOUS

## 2014-10-02 MED ORDER — SODIUM CHLORIDE 0.9 % IV SOLN
INTRAVENOUS | Status: DC
  Administered 2014-10-02: 06:00:00 via INTRAVENOUS

## 2014-10-02 MED ORDER — IOHEXOL 300 MG/ML  SOLN
INTRAMUSCULAR | Status: DC | PRN
  Administered 2014-10-02: 80 mL via INTRA_ARTERIAL

## 2014-10-02 MED ORDER — MIDAZOLAM HCL 5 MG/5ML IJ SOLN
INTRAMUSCULAR | Status: AC
  Filled 2014-10-02: qty 5

## 2014-10-02 MED ORDER — LIDOCAINE-EPINEPHRINE (PF) 1 %-1:200000 IJ SOLN
INTRAMUSCULAR | Status: AC
  Filled 2014-10-02: qty 30

## 2014-10-02 MED ORDER — CEFAZOLIN SODIUM 1-5 GM-% IV SOLN
INTRAVENOUS | Status: AC
  Filled 2014-10-02: qty 50

## 2014-10-02 MED ORDER — FENTANYL CITRATE (PF) 100 MCG/2ML IJ SOLN
INTRAMUSCULAR | Status: AC
  Filled 2014-10-02: qty 2

## 2014-10-02 MED ORDER — CHLORHEXIDINE GLUCONATE CLOTH 2 % EX PADS: 6.0000 | MEDICATED_PAD | Freq: Once | CUTANEOUS | Status: DC

## 2014-10-02 MED ORDER — MIDAZOLAM HCL 2 MG/2ML IJ SOLN
INTRAMUSCULAR | Status: DC | PRN
  Administered 2014-10-02: 1 mg via INTRAVENOUS
  Administered 2014-10-02: 2 mg via INTRAVENOUS

## 2014-10-02 MED ORDER — PROPOFOL 10 MG/ML IV BOLUS
INTRAVENOUS | Status: DC | PRN
  Administered 2014-10-02: 20 mg via INTRAVENOUS

## 2014-10-02 MED ORDER — SODIUM CHLORIDE 0.9 % IV SOLN
INTRAVENOUS | Status: DC | PRN
  Administered 2014-10-02: 14:00:00 via INTRAVENOUS

## 2014-10-02 MED ORDER — CEFAZOLIN SODIUM 1-5 GM-% IV SOLN
1.0000 g | Freq: Once | INTRAVENOUS | Status: AC
  Administered 2014-10-02: 1 g via INTRAVENOUS

## 2014-10-02 MED ORDER — PROPOFOL INFUSION 10 MG/ML OPTIME: INTRAVENOUS | Status: DC | PRN

## 2014-10-02 MED ORDER — HEPARIN (PORCINE) IN NACL 2-0.9 UNIT/ML-% IJ SOLN
INTRAMUSCULAR | Status: AC
  Filled 2014-10-02: qty 1000

## 2014-10-02 MED ORDER — HEPARIN SODIUM (PORCINE) 1000 UNIT/ML IJ SOLN
INTRAMUSCULAR | Status: AC
  Filled 2014-10-02: qty 1

## 2014-10-02 MED ORDER — INSULIN ASPART 100 UNIT/ML ~~LOC~~ SOLN
10.0000 [IU] | Freq: Three times a day (TID) | SUBCUTANEOUS | Status: DC
  Administered 2014-10-02 – 2014-10-04 (×6): 10 [IU] via SUBCUTANEOUS
  Filled 2014-10-02 (×5): qty 10

## 2014-10-02 MED ORDER — FENTANYL CITRATE (PF) 100 MCG/2ML IJ SOLN
INTRAMUSCULAR | Status: DC | PRN
  Administered 2014-10-02: 50 ug via INTRAVENOUS

## 2014-10-02 MED ORDER — MIDAZOLAM HCL 2 MG/2ML IJ SOLN
INTRAMUSCULAR | Status: DC | PRN
  Administered 2014-10-02: 2 mg via INTRAVENOUS

## 2014-10-02 MED ORDER — FENTANYL CITRATE (PF) 100 MCG/2ML IJ SOLN: 25.0000 ug | INTRAMUSCULAR | Status: DC | PRN

## 2014-10-02 MED ORDER — ONDANSETRON HCL 4 MG/2ML IJ SOLN: 4.0000 mg | Freq: Once | INTRAMUSCULAR | Status: DC | PRN

## 2014-10-02 SURGICAL SUPPLY — 33 items
BANDAGE ELASTIC 4 CLIP NS LF (GAUZE/BANDAGES/DRESSINGS) IMPLANT
BANDAGE STRETCH 3X4.1 STRL (GAUZE/BANDAGES/DRESSINGS) IMPLANT
BLADE MED AGGRESSIVE (BLADE) ×2 IMPLANT
BLADE SURG 15 STRL LF DISP TIS (BLADE) ×4 IMPLANT
BLADE SURG 15 STRL SS (BLADE) ×4
BNDG ESMARK 4X12 TAN STRL LF (GAUZE/BANDAGES/DRESSINGS) ×2 IMPLANT
BNDG GAUZE 4.5X4.1 6PLY STRL (MISCELLANEOUS) IMPLANT
CANISTER SUCT 1200ML W/VALVE (MISCELLANEOUS) ×2 IMPLANT
DRAPE FLUOR MINI C-ARM 54X84 (DRAPES) ×2 IMPLANT
DURAPREP 26ML APPLICATOR (WOUND CARE) ×4 IMPLANT
GAUZE PETRO XEROFOAM 1X8 (MISCELLANEOUS) IMPLANT
GAUZE SPONGE 4X4 12PLY STRL (GAUZE/BANDAGES/DRESSINGS) IMPLANT
GLOVE BIO SURGEON STRL SZ8 (GLOVE) ×4 IMPLANT
GLOVE INDICATOR 7.5 STRL GRN (GLOVE) ×4 IMPLANT
GOWN STRL REUS W/ TWL LRG LVL3 (GOWN DISPOSABLE) ×2 IMPLANT
GOWN STRL REUS W/TWL LRG LVL3 (GOWN DISPOSABLE) ×2
KIT RM TURNOVER STRD PROC AR (KITS) ×2 IMPLANT
LABEL OR SOLS (LABEL) ×2 IMPLANT
NDL SAFETY 18GX1.5 (NEEDLE) ×2 IMPLANT
NEEDLE HYPO 25X1 1.5 SAFETY (NEEDLE) ×4 IMPLANT
NS IRRIG 500ML POUR BTL (IV SOLUTION) ×2 IMPLANT
PACK EXTREMITY ARMC (MISCELLANEOUS) ×2 IMPLANT
PAD GROUND ADULT SPLIT (MISCELLANEOUS) ×2 IMPLANT
PENCIL ELECTRO HAND CTR (MISCELLANEOUS) ×2 IMPLANT
RASP SM TEAR CROSS CUT (RASP) IMPLANT
STOCKINETTE STRL 6IN 960660 (GAUZE/BANDAGES/DRESSINGS) ×2 IMPLANT
SUT ETH BLK MONO 3 0 FS 1 12/B (SUTURE) ×2 IMPLANT
SUT ETHILON 2 0 FSLX (SUTURE) ×2 IMPLANT
SUT ETHILON 3 0 FSLX (SUTURE) ×2 IMPLANT
SUT ETHILON 5 0 PS 2 18 (SUTURE) ×2 IMPLANT
SUT VIC AB 4-0 FS2 27 (SUTURE) IMPLANT
SWAB CULTURE AMIES ANAERIB BLU (MISCELLANEOUS) ×2 IMPLANT
SYRINGE 10CC LL (SYRINGE) ×2 IMPLANT

## 2014-10-02 SURGICAL SUPPLY — 10 items
BAG DECANTER STRL (MISCELLANEOUS) ×2 IMPLANT
CATH PIG 70CM (CATHETERS) ×2 IMPLANT
DEVICE STARCLOSE SE CLOSURE (Vascular Products) ×2 IMPLANT
GLIDEWIRE ADV .035X260CM (WIRE) ×2 IMPLANT
PACK ANGIOGRAPHY (CUSTOM PROCEDURE TRAY) ×2 IMPLANT
SHEATH AVANTI 4FRX11 (SHEATH) ×2 IMPLANT
SHEATH BRITE TIP 5FRX11 (SHEATH) ×2 IMPLANT
SYR MEDRAD MARK V 150ML (SYRINGE) ×2 IMPLANT
TUBING CONTRAST HIGH PRESS 72 (TUBING) ×2 IMPLANT
WIRE J 3MM .035X145CM (WIRE) ×2 IMPLANT

## 2014-10-02 NOTE — Op Note (Signed)
Operative note   Surgeon: Dr. Albertine Patricia, DPM.    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 First ray amputation right foot including removal of the residual proximal phalanx metatarsal head and both sesamoids.         EBL: 75 cc    Anesthesia:IV sedation    Hemostasis: Ankle tourniquet 275 mmHg pressure    Specimen: Degenerative osteomyelitic sesamoids and proximal phalanx base and also metatarsal head    Complications: None    Operative indications: Diabetic chronic infection right partial amputation site to right hallux stump with likely osteomyelitis to residual proximal phalanx base and sesamoid bones. Wound plantarly is approximately 2-1/2 cm in diameter and is obviously full-thickness to the joint level    Procedure:  Patient was brought into the OR and placed on the operating table in thesupine position. After anesthesia was obtained theright lower extremity was prepped and draped in usual sterile fashion.  Operative Report: This time attention was directed to the dorsum of the right foot where an elliptical incision was made over the residual stump of the proximal phalanx and first metatarsal head. The ellipse included on the plantar aspect of the ulceration which is approximately 2-1/2 cm diameter.. Proximal 6 cm long. This was removed and incision was carried down to the bone of the first metatarsal proximal phalanx. And on the plantar aspect all the way to the sesamoids. There was necrotic tissue encountered during the process is was debrided and removed. Once the head of metatarsals identified the level approximately 1.5-2 cm proximal to that was identified and an osteotomy and bone cut was made through this from dorsal to plantar. Point the residual proximal phalanx base the first metatarsal  head and the sesamoids were all removed in one unit tissue deep tissue was cultured and sent to pathology. Excised ulceration was also sent to pathology. The tourniquet was really not very helpful during this timeframe was only up for about 10 minutes. This is released this point and bleeders clamped and bovied as required. After copious irrigation the medial flap was brought laterally and sutured into the area over the the remaining first metatarsal with primary closure. This was accomplished with a combination of 20 and 3-0 Vicryl sutures combination of simple interrupted vertical mattress and horizontal mattress sutures were utilized. At this time the a sterile compressive dressing was placed across the wounds consisting of Xeroform gauze 4 x 4's conformation Kerlix and ABDs pads and an Ace wrap. Left foot which also hasn't ulceration but a superficial was padded and rasped as well.    Patient tolerated the procedure and anesthesia well.  Was transported from the OR to the PACU with all vital signs stable and vascular status intact. To be discharged per routine protocol.  Will follow up in approximately 1 week in the outpatient clinic.

## 2014-10-02 NOTE — Progress Notes (Signed)
[facility] NAME: Raymond [name]    MR#:  [id number]  DATE OF BIRTH:  1965-05-21  SUBJECTIVE:  Seen in PACU, status post amputation of right foot including removal of the residual proximal phalanx metatarsal head and both sesamoids.  No immediate complications,  REVIEW OF SYSTEMS:    Review of Systems  Constitutional: Negative for fever, chills and malaise/fatigue.  HENT: Negative for sore throat.   Eyes: Negative for blurred vision.  Respiratory: Negative for cough, hemoptysis, shortness of breath and wheezing.   Cardiovascular: Negative for chest pain, palpitations and leg swelling.  Gastrointestinal: Negative for nausea, vomiting, abdominal pain, diarrhea and blood in stool.  Genitourinary: Negative for dysuria.  Musculoskeletal: Negative for back pain.  Skin:       Bilateral foot dressing in place  Neurological: Negative for dizziness, tremors and headaches.  Endo/Heme/Allergies: Does not bruise/bleed easily.    Tolerating Diet:yes DRUG ALLERGIES:  No Known Allergies VITALS:  Blood pressure 106/58, pulse 66, temperature 99.3 F (37.4 C), temperature source Oral, resp. rate 16, height 6\' 2"  (1.88 m), weight 167.831 kg (370 lb), SpO2 94 %. PHYSICAL EXAMINATION:  Physical Exam  Constitutional: He is oriented to person, place, and time and well-developed, well-nourished, and in no distress. No distress.  HENT:  Head: Normocephalic.  Eyes: No scleral icterus.  Neck: Normal range of motion. Neck supple. No JVD present. No tracheal deviation present.  Cardiovascular: Normal rate, regular rhythm and normal heart sounds.  Exam reveals no gallop and no friction rub.   No murmur heard. Pulmonary/Chest: Effort normal and breath sounds normal. No respiratory distress. He has no wheezes. He has no rales. He exhibits no tenderness.  Abdominal: Soft. Bowel sounds are normal. He exhibits no distension and no mass. There is  no tenderness. There is no rebound and no guarding.  Musculoskeletal: Normal range of motion. He exhibits no edema.  Neurological: He is alert and oriented to person, place, and time.  Skin: Skin is warm. No rash noted. No erythema.  Both feet are wrapped in Kerlix dressing  Psychiatric: Affect and judgment normal.   LABORATORY PANEL:   CBC  Recent Labs Lab 10/01/14 0653  WBC 14.6*  HGB 10.7*  HCT 32.8*  PLT 323   ------------------------------------------------------------------------------------------------------------------  Chemistries   Recent Labs Lab 10/01/14 0653  NA 138  K 4.3  CL 104  CO2 28  GLUCOSE 211*  BUN 17  CREATININE 1.18  CALCIUM 8.3*    RADIOLOGY:  Mr Foot Right W Wo Contrast  10/01/2014  MPRESSION: 1. Sinus tract associated with the nonhealing wound along the plantar aspect of the first MTP joint which extends to the tibial sesamoid and likely into the first metatarsal phalangeal joint. 2. There is associated osteomyelitis of the tibial sesamoid. There is possible early osteomyelitis of the fibular sesamoid and remaining base of the proximal phalanx. 3. Heterogeneous soft tissue enhancement surrounding the first MTP joint with probable small abscesses or areas of devascularization.   Electronically Signed   By: Richardean Sale M.D.   On: 10/01/2014 10:24   ASSESSMENT AND PLAN:   49 year old male with diabetes and history of previous amputation of both great toes who presented with foul odor from right foot and redness.  1. Osteomyelitis of right foot, tibial sesamoid and possible fibular sesamoid and proximal phalanx: Patient was seen and evaluated by podiatry. Patient underwent debridement.  He underwent amputation of right foot including removal of the  residual proximal phalanx metatarsal head and both sesamoids for further management of the osteomyelitis. Continue broad-spectrum antibiotics with Zosyn and vancomycin.  We will consult infectious  disease. Blood cultures are negative to date.  Vascular surgery consult appreciated   2. 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 A1c is 6.6.  3. Essential hypertension: Continue Coreg and enalapril. Blood pressure is acceptable.  4. Acute kidney injury: Creatinine is improved with IV fluids. I will discontinue IV fluids due to history of systolic heart failure.  5. OSA: Continue CPAP.  6. Systolic heart failure: Patient does not appear to be in exacerbation at this time. Continue to monitor.      Management plans discussed with the patient and he is in agreement.  CODE STATUS: full  TOTAL TIME TAKING CARE OF THIS PATIENT: 30 minutes.     POSSIBLE D/C 2-3 days, DEPENDING ON CLINICAL CONDITION.   [facility], Jahmeek Shirk M.D on 10/02/2014 at 3:24 PM  Between 7am to 6pm - Pager - [phone] After 6pm go to [email or web address] - password [email or web address] [facility]  [facility]  Office  [phone]  CC: Primary care physician; Tomasita Morrow, MD

## 2014-10-02 NOTE — Progress Notes (Signed)
   09/30/14 1700  Wound / Incision (Open or Dehisced) 09/30/14 Toe (Comment  which one) Right 3cm long x 2.5 wide  Date First Assessed/Time First Assessed: 09/30/14 1700   Location: Toe (Comment  which one)  Location Orientation: Right  Wound Description (Comments): 3cm long x 2.5 wide  Present on Admission: Yes  Site / Wound Assessment Black  % Wound base Yellow 50%  Peri-wound Assessment Black  Wound Length (cm) 3 cm  Wound Width (cm) 2 cm  Wound Depth (cm) 1 cm  Margins Attached edges (approximated)  Drainage Amount None  Treatment Cleansed

## 2014-10-02 NOTE — Anesthesia Preprocedure Evaluation (Signed)
Anesthesia Evaluation  Patient identified by MRN, date of birth, ID band Patient awake    Reviewed: Allergy & Precautions, NPO status , Patient's Chart, lab work & pertinent test results, reviewed documented beta blocker date and time   Airway Mallampati: III  TM Distance: >3 FB     Dental  (+) Chipped   Pulmonary shortness of breath, sleep apnea, Continuous Positive Airway Pressure Ventilation and Oxygen sleep apnea , COPD oxygen dependent,          Cardiovascular hypertension, Pt. on medications and Pt. on home beta blockers + Peripheral Vascular Disease     Neuro/Psych    GI/Hepatic   Endo/Other  diabetes, Type 2  Renal/GU      Musculoskeletal  (+) Arthritis -,   Abdominal   Peds  Hematology   Anesthesia Other Findings Obesity.  Reproductive/Obstetrics                             Anesthesia Physical Anesthesia Plan  ASA: III  Anesthesia Plan: General and MAC   Post-op Pain Management:    Induction:   Airway Management Planned:   Additional Equipment:   Intra-op Plan:   Post-operative Plan:   Informed Consent: I have reviewed the patients History and Physical, chart, labs and discussed the procedure including the risks, benefits and alternatives for the proposed anesthesia with the patient or authorized representative who has indicated his/her understanding and acceptance.     Plan Discussed with: CRNA  Anesthesia Plan Comments:         Anesthesia Quick Evaluation

## 2014-10-02 NOTE — Consult Note (Signed)
[facility]ious [facility]     Reason for Consult:Osteomyelitis   Referring Physician: Mody Date of Admission:  09/30/2014   Active Problems:   Cellulitis of right foot   HPI: Raymond [name] is a 49 y.o. male with longstanding DM with severe PN who has been dealing with bil LE wounds  Admitted for R foot pain, worsening swelling and drainage from ulcer on R foot. He has had prior amputations of several toes. He states these ulcers began when he was walking on the track in leather shoes with no socks on.  Due to his PN he did not notice that he had developed blisters which progressed over time.  He was admitted, seen by podiatry and vascular and had an MRI and vascular eval.   MRI showed R foot osteo. WBC on admit was 17. He has had low grade temps. He underwent angio gram and surgical I and D 9/1.  Cultures are pending.  He will likely be dced per his report to a rehab facility. His A1c is well controlled.    Procedure:  1. First ray amputation right foot including removal of the residual proximal phalanx metatarsal head and both sesamoids.  Vascular eval 9/1 Findings:  Aortogram: Normal renal arteries and normal aorta and iliac arteries Right Lower Extremity: three vessel runoff, no hemodynamically significant stenosis Left Lower Extremity: two vessel runoff with occlusion of PT artery, but otherwise no significant stenosis identified  Past Medical History  Diagnosis Date  . Diabetes   . Hypertension   . Morbid obesity   . Systolic heart failure     Preserved EF 50-55%  . Chronic respiratory failure with hypoxia     2 L [place] continuously  . OSA on CPAP   . Arthritis     hands  . Cellulitis   . PVD (peripheral vascular disease)   . Hepatic steatosis    Past Surgical History  Procedure Laterality Date  . Eye surgery Left     bilat laser  . 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 Toe amputation Right 2009  .  Cholecystectomy    . Hip surgery Right     8 th grade pins   Social History  Substance Use Topics  . Smoking status: Never Smoker   . Smokeless tobacco: Never Used  . Alcohol Use: No   Family History  Problem Relation Age of Onset  . Hypertension Mother   . Diabetes Mother   . Stroke Father   . Hypertension Father   . Heart attack Father     Allergies: No Known Allergies  Current antibiotics: Antibiotics Given (last 72 hours)    Date/Time Action Medication Dose Rate   09/30/14 2047 Given   vancomycin (VANCOCIN) 1,500 mg in sodium chloride 0.9 % 500 mL IVPB 1,500 mg 250 mL/hr   09/30/14 2254 Given   piperacillin-tazobactam (ZOSYN) IVPB 4.5 g 4.5 g 25 mL/hr   10/01/14 0427 Given   vancomycin (VANCOCIN) 1,500 mg in sodium chloride 0.9 % 500 mL IVPB 1,500 mg 250 mL/hr   10/01/14 0524 Given   piperacillin-tazobactam (ZOSYN) IVPB 4.5 g 4.5 g 25 mL/hr   10/01/14 1308 Given   vancomycin (VANCOCIN) 1,500 mg in sodium chloride 0.9 % 500 mL IVPB 1,500 mg 250 mL/hr   10/01/14 1420 Given   piperacillin-tazobactam (ZOSYN) IVPB 4.5 g 4.5 g 25 mL/hr   10/01/14 2132 Given   vancomycin (VANCOCIN) 1,500 mg in sodium chloride 0.9 % 500 mL  IVPB 1,500 mg 250 mL/hr   10/01/14 2132 Given   piperacillin-tazobactam (ZOSYN) IVPB 4.5 g 4.5 g 25 mL/hr   10/02/14 0546 Given   vancomycin (VANCOCIN) 1,500 mg in sodium chloride 0.9 % 500 mL IVPB 1,500 mg 250 mL/hr   10/02/14 0546 Given   piperacillin-tazobactam (ZOSYN) IVPB 4.5 g 4.5 g 25 mL/hr   10/02/14 1113 Given   ceFAZolin (ANCEF) IVPB 1 g/50 mL premix 1 g 100 mL/hr   10/02/14 1638 Given  [Pt in OR]   vancomycin (VANCOCIN) 1,500 mg in sodium chloride 0.9 % 500 mL IVPB 1,500 mg 250 mL/hr   10/02/14 1638 Given  [Pt in OR]   piperacillin-tazobactam (ZOSYN) IVPB 4.5 g 4.5 g 25 mL/hr      MEDICATIONS: . aspirin EC  81 mg Oral Daily  . carvedilol  12.5 mg Oral BID  . Chlorhexidine Gluconate Cloth  6 each Topical Once  . enalapril  40 mg Oral  BID  . furosemide  40 mg Oral BID  . gabapentin  300 mg Oral TID  . heparin  5,000 Units Subcutaneous 3 times per day  . insulin aspart  0-20 Units Subcutaneous TID WC  . insulin aspart  0-5 Units Subcutaneous QHS  . insulin aspart  10 Units Subcutaneous TID WC  . mometasone-formoterol  2 puff Inhalation BID  . pantoprazole  40 mg Oral Daily  . piperacillin-tazobactam  4.5 g Intravenous 3 times per day  . potassium chloride  10 mEq Oral BID  . vancomycin  1,500 mg Intravenous Q8H    Review of Systems - 11 systems reviewed and negative per HPI   OBJECTIVE: Temp:  [98.4 F (36.9 C)-100.1 F (37.8 C)] 99.6 F (37.6 C) (09/01 2022) Pulse Rate:  [65-88] 88 (09/01 2022) Resp:  [16-32] 20 (09/01 2022) BP: (96-154)/(50-99) 139/65 mmHg (09/01 2022) SpO2:  [88 %-100 %] 94 % (09/01 2022) Physical Exam  Constitutional: He is oriented to person, place, and time. He appears well-developed and well-nourished- obese. No distress.  HENT:  Mouth/Throat: Oropharynx is clear and moist. No oropharyngeal exudate.  Cardiovascular: Normal rate, regular rhythm and normal heart sounds. Exam reveals no gallop and no friction rub.  No murmur heard.  Pulmonary/Chest: Effort normal and breath sounds normal. No respiratory distress. He has no wheezes.  Abdominal: Soft. Bowel sounds are normal. He exhibits no distension. There is no tenderness.  Lymphadenopathy:  He has no cervical adenopathy.  Neurological: He is alert and oriented to person, place, and time.  B LE sensation loss Skin: Skin is warm and dry. bil feet are wrapped post op EXt 2 + edema bil LE Psychiatric: He has a normal mood and affect. His behavior is normal.     LABS: Results for orders placed or performed during the hospital encounter of 09/30/14 (from the past 48 hour(s))  Glucose, capillary     Status: Abnormal   Collection Time: 09/30/14  9:08 PM  Result Value Ref Range   Glucose-Capillary 267 (H) 65 - 99 mg/dL  Basic metabolic  panel     Status: Abnormal   Collection Time: 10/01/14  6:53 AM  Result Value Ref Range   Sodium 138 135 - 145 mmol/L   Potassium 4.3 3.5 - 5.1 mmol/L   Chloride 104 101 - 111 mmol/L   CO2 28 22 - 32 mmol/L   Glucose, Bld 211 (H) 65 - 99 mg/dL   BUN 17 6 - 20 mg/dL   Creatinine, Ser 1.18 0.61 - 1.24  mg/dL   Calcium 8.3 (L) 8.9 - 10.3 mg/dL   GFR calc non Af Amer >60 >60 mL/min   GFR calc Af Amer >60 >60 mL/min    Comment: (NOTE) The eGFR has been calculated using the CKD EPI equation. This calculation has not been validated in all clinical situations. eGFR's persistently <60 mL/min signify possible Chronic Kidney Disease.    Anion gap 6 5 - 15  CBC     Status: Abnormal   Collection Time: 10/01/14  6:53 AM  Result Value Ref Range   WBC 14.6 (H) 3.8 - 10.6 K/uL   RBC 4.04 (L) 4.40 - 5.90 MIL/uL   Hemoglobin 10.7 (L) 13.0 - 18.0 g/dL   HCT 32.8 (L) 40.0 - 52.0 %   MCV 81.2 80.0 - 100.0 fL   MCH 26.5 26.0 - 34.0 pg   MCHC 32.6 32.0 - 36.0 g/dL   RDW 15.0 (H) 11.5 - 14.5 %   Platelets 323 150 - 440 K/uL  Glucose, capillary     Status: Abnormal   Collection Time: 10/01/14  7:38 AM  Result Value Ref Range   Glucose-Capillary 213 (H) 65 - 99 mg/dL  Glucose, capillary     Status: Abnormal   Collection Time: 10/01/14 11:28 AM  Result Value Ref Range   Glucose-Capillary 230 (H) 65 - 99 mg/dL  Glucose, capillary     Status: None   Collection Time: 10/01/14  4:12 PM  Result Value Ref Range   Glucose-Capillary 99 65 - 99 mg/dL  Vancomycin, trough     Status: None   Collection Time: 10/01/14  8:21 PM  Result Value Ref Range   Vancomycin Tr 19 10 - 20 ug/mL  Glucose, capillary     Status: Abnormal   Collection Time: 10/01/14  9:01 PM  Result Value Ref Range   Glucose-Capillary 116 (H) 65 - 99 mg/dL   Comment 1 Notify RN   MRSA PCR Screening     Status: None   Collection Time: 10/02/14  5:50 AM  Result Value Ref Range   MRSA by PCR NEGATIVE NEGATIVE    Comment:        The  GeneXpert MRSA Assay (FDA approved for NASAL specimens only), is one component of a comprehensive MRSA colonization surveillance program. It is not intended to diagnose MRSA infection nor to guide or monitor treatment for MRSA infections.   Glucose, capillary     Status: Abnormal   Collection Time: 10/02/14  4:19 PM  Result Value Ref Range   Glucose-Capillary 108 (H) 65 - 99 mg/dL   Comment 1 Notify RN    No components found for: ESR, C REACTIVE PROTEIN MICRO: Recent Results (from the past 720 hour(s))  Culture, blood (routine x 2)     Status: None (Preliminary result)   Collection Time: 09/30/14 12:45 PM  Result Value Ref Range Status   Specimen Description BLOOD  Final   Special Requests   Final    BOTTLES DRAWN AEROBIC AND ANAEROBIC 1CC AEROBIC,1CC ANAEROBIC   Culture NO GROWTH 2 DAYS  Final   Report Status PENDING  Incomplete  Blood culture (routine x 2)     Status: None (Preliminary result)   Collection Time: 09/30/14  2:05 PM  Result Value Ref Range Status   Specimen Description BLOOD RIGHT ASSIST CONTROL  Final   Special Requests   Final    BOTTLES DRAWN AEROBIC AND ANAEROBIC  5 CC ANAERO 3 CC AERO   Culture NO GROWTH 2  DAYS  Final   Report Status PENDING  Incomplete  Blood culture (routine x 2)     Status: None (Preliminary result)   Collection Time: 09/30/14  5:23 PM  Result Value Ref Range Status   Specimen Description BLOOD LEFT ARM  Final   Special Requests BOTTLES DRAWN AEROBIC AND ANAEROBIC 10CC  Final   Culture NO GROWTH 2 DAYS  Final   Report Status PENDING  Incomplete  MRSA PCR Screening     Status: None   Collection Time: 10/02/14  5:50 AM  Result Value Ref Range Status   MRSA by PCR NEGATIVE NEGATIVE Final    Comment:        The GeneXpert MRSA Assay (FDA approved for NASAL specimens only), is one component of a comprehensive MRSA colonization surveillance program. It is not intended to diagnose MRSA infection nor to guide or monitor treatment  for MRSA infections.     IMAGING: Mr Foot Right W Wo Contrast  10/01/2014   CLINICAL DATA:  Nonhealing wound at site of previous great toe amputation with swelling and discharge for 2 or 3 months. History of great toe amputation in 2012. Initial encounter.  EXAM: MRI OF THE RIGHT FOREFOOT WITHOUT AND WITH CONTRAST  TECHNIQUE: Multiplanar, multisequence MR imaging was performed both before and after administration of intravenous contrast.  CONTRAST:  46m MULTIHANCE GADOBENATE DIMEGLUMINE 529 MG/ML IV SOLN  COMPARISON:  Radiographs 09/30/2014.  FINDINGS: Patient is status post amputation of the great toe through the base of the proximal phalanx. The nonhealing ulcer is noted along the medial plantar aspect of the first metatarsal phalangeal joint. There is a sinus tract extending to the tibial sesamoid, and likely into the joint. There is synovial enhancement at the first MTP joint. There is also irregular soft tissue enhancement surrounding the joint with a small fluid collection tracking proximally along the flexor hallucis brevis muscle. No significant fluid is present within the flexor hallucis longus tendon sheath. There is an irregular 3 cm area of decreased enhancement within the first web space without corresponding well-defined fluid collection on the T2 weighted images. This likely represents an area of soft tissue devitalization and/or inflammation.  There is peripheral enhancement of this sinus tract. The tibial sesamoid demonstrates cortical destruction and replaced normal T1 signal. Post-contrast, there is diffuse enhancement of the tibial sesamoid. The fibular sesamoid and remaining base of the proximal phalanx demonstrate nonspecific low level T2 hyperintensity and enhancement without cortical destruction. The head of the first metatarsal demonstrates no significant findings.  The other toes and metatarsals appear normal. No significant arthropathic changes are present within the midfoot. There  is generalized muscular T2 hyperintensity attributed to diabetes.  IMPRESSION: 1. Sinus tract associated with the nonhealing wound along the plantar aspect of the first MTP joint which extends to the tibial sesamoid and likely into the first metatarsal phalangeal joint. 2. There is associated osteomyelitis of the tibial sesamoid. There is possible early osteomyelitis of the fibular sesamoid and remaining base of the proximal phalanx. 3. Heterogeneous soft tissue enhancement surrounding the first MTP joint with probable small abscesses or areas of devascularization.   Electronically Signed   By: WRichardean SaleM.D.   On: 10/01/2014 10:24   Dg Foot Complete Right  09/30/2014   CLINICAL DATA:  Foot ulcers  EXAM: RIGHT FOOT COMPLETE - 3+ VIEW  COMPARISON:  None.  FINDINGS: Frontal, oblique, and lateral views were obtained. The patient has had amputation at the level of the proximal aspect  of the first proximal phalanx. There is soft tissue irregularity along the stump region of the amputation site consistent with ulceration. There is no bony destruction apparent. No fracture or dislocation. There is generalized soft tissue swelling. There is extensive arterial vascular calcification.  IMPRESSION: Ulceration at the site of prior amputation of the first digit. No osteomyelitis is appreciable on this study. No fracture or dislocation. There is soft tissue swelling in extensive arterial vascular calcification.  If there remains concern for osteomyelitis, MR or nuclear medicine three-phase bone scan could be helpful to further assess.   Electronically Signed   By: Lowella Grip III M.D.   On: 09/30/2014 14:02    Assessment:   Abrahim Sargent Veenstra is a 49 y.o. male with well controlled DM complicated by severe PN and prior issues with LE infections and toe amputation admitted with bil ulceration and osteo of R 1st MTP, no s/p resection. He has had angiogram and vascular supply seems  intact.   Recommendations Check ESR CRP - ordered Cont vanco and zosyn Will need picc placed and likely 4-6 weeks IV abx Thank you very much for allowing me to participate in the care of this patient. Please call with questions.   Cheral Marker. Ola Spurr, MD

## 2014-10-02 NOTE — Anesthesia Postprocedure Evaluation (Signed)
  Anesthesia Post-op Note  Patient: Raymond [name]  Procedure(s) Performed: Procedure(s): AMPUTATION RAY (Right)  Anesthesia type:General, MAC  Patient location: PACU  Post pain: Pain level controlled  Post assessment: Post-op Vital signs reviewed, Patient's Cardiovascular Status Stable, Respiratory Function Stable, Patent Airway and No signs of Nausea or vomiting  Post vital signs: Reviewed and stable  Last Vitals:  Filed Vitals:   10/02/14 1617  BP: 129/54  Pulse: 72  Temp: 36.9 C  Resp: 16    Level of consciousness: awake, alert  and patient cooperative  Complications: No apparent anesthesia complications

## 2014-10-02 NOTE — Op Note (Signed)
[facility] Percutaneous Study/Intervention Procedural Note   Date of Surgery: 09/30/2014 - 10/02/2014  Surgeon(s):DEW,JASON   Assistants:none  Pre-operative Diagnosis: PAD with ulceration BLE and osteomyelitis in right foot  Post-operative diagnosis: Same  Procedure(s) Performed: 1. Ultrasound guidance for vascular access left femoral artery 2. Catheter placement into mid right SFA from left femoral approach 3. Aortogram and selective bilateral lower extremity angiograms 4. StarClose closure device left femoral artery  EBL: 25 cc  Indications: Patient is a 49 yo AAM  with non-healing ulcerations on both feet and osteomyelitis on the right.  He has already lost two toes on the left and one toe on the right. He is an inpatient, and we do not have ABIs or arterial duplex in our facility. The patient is brought in for angiography for further evaluation and potential treatment. Risks and benefits are discussed and informed consent is obtained  Procedure: The patient was identified and appropriate procedural time out was performed. The patient was then placed supine on the table and prepped and draped in the usual sterile fashion. Ultrasound was used to evaluate the  left common femoral artery. It was patent . A digital ultrasound image was acquired. A Seldinger needle was used to access the  left common femoral artery under direct ultrasound guidance and a permanent image was performed. A 0.035 J wire was advanced without resistance and a 5Fr sheath was placed. Pigtail catheter was placed into the aorta and an AP aortogram was performed. This demonstrated normal renal arteries and normal aorta and iliac segments without significant stenosis. I then crossed the aortic bifurcation and advanced to the  right femoral head.  Due to reasonably slow traverse minimal blood flow distally, I advanced the   pigtail Catheter into the midsuperficial femoral artery to evaluate the distal vessels.  Selective  right lower extremity angiogram was then performed. This demonstrated  Calcification throughout the vessels in the right lower extremity particularly the tibial vessels, but there was brisk flow and no hemodynamically significant stenoses were identified three-vessel runoff to the foot.  I then removed the diagnostic catheter and replaced 0.035 wire. Imaging of the left lower extremity was performed through the left femoral sheath. This demonstrated no significant stenosis in the common femoral artery, profunda femoris artery, superficial femoral artery, or popliteal artery. Opacification of the tibial arteries distally was difficult due to slow flow and patient motion but there appeared to be two-vessel runoff with a chronically occluded posterior tibial artery distally but a patent peroneal artery and anterior tibial artery without significant stenosis. He appeared to have adequate perfusion to heal both lower extremities and no revascularization was indicated. The sheath was removed and StarClose closure device was deployed in the left femoral artery with excellent hemostatic result. The patient was taken to the recovery room in stable condition having tolerated the procedure well.  Findings:  Aortogram: Normal renal arteries and normal aorta and iliac arteries Right Lower Extremity: three vessel runoff, no hemodynamically significant stenosis  Left Lower Extremity: two vessel runoff with occlusion of PT artery, but otherwise no significant stenosis identified   Disposition: Patient was taken to the recovery room in stable condition having tolerated the procedure well.  Complications: None  DEW,JASON 10/02/2014 11:41 AM

## 2014-10-02 NOTE — Progress Notes (Signed)
   09/30/14 1700  Wound / Incision (Open or Dehisced) 09/30/14 Diabetic ulcer Toe (Comment  which one) Left 3cmlongx2cmwide  Date First Assessed/Time First Assessed: 09/30/14 1700   Wound Type: Diabetic ulcer  Location: Toe (Comment  which one)  Location Orientation: Left  Wound Description (Comments): 3cmlongx2cmwide  Present on Admission: Yes  Dressing Type Gauze (Comment)  Site / Wound Assessment Black  % Wound base Yellow 50%  Peri-wound Assessment Intact  Wound Length (cm) 3 cm (3cm)  Wound Width (cm) 2.5 cm  Wound Depth (cm) 1 cm  Margins Attached edges (approximated)  Drainage Amount None  Treatment Cleansed

## 2014-10-02 NOTE — Transfer of Care (Signed)
Immediate Anesthesia Transfer of Care Note  Patient: Raymond [name]  Procedure(s) Performed: Procedure(s): AMPUTATION RAY (Right)  Patient Location: PACU  Anesthesia Type:General  Level of Consciousness: sedated  Airway & Oxygen Therapy: Patient Spontanous Breathing and Patient connected to face mask oxygen  Post-op Assessment: Report given to RN and Post -op Vital signs reviewed and stable  Post vital signs: Reviewed and stable  Last Vitals:  Filed Vitals:   10/02/14 1503  BP: 106/58  Pulse: 66  Temp: 37.4 C  Resp: 16    Complications: No apparent anesthesia complications

## 2014-10-02 NOTE — Plan of Care (Addendum)
RN contacted dr to confirm hold on AM Levemir of 70U and 60U if Novolog, D/T current BS of 103. Instructed to give BP meds only. POC is to  Go to surgery about 1330 - pt informed.

## 2014-10-02 NOTE — Plan of Care (Signed)
Podiatry called and confirmed to confirmed to noit give Heparin for today - 9/1. Resume on 9/2.

## 2014-10-02 NOTE — Care Management (Signed)
CSW consult for SNF placement.  Patient has chosen [facility] if appropriate at time of discharge.  Plan for OR today.  RNCM to follow for discharge planning

## 2014-10-02 NOTE — Clinical Documentation Improvement (Addendum)
Hospitalist  Please clarify if the following diagnosis, Chronic Respiratory Failure was:   Present at the time of admission (POA)  NOT present at the time of admission and it developed during the inpatient stay  Unable to clinically determine whether the condition was present on admission.  Unknown   Supporting Information:   Pt on 2 L [place] continuous at home   Please exercise your independent, professional judgment when responding. A specific answer is not anticipated or expected.  Present at the time of admission (POA)  Thank You,  [place]

## 2014-10-02 NOTE — H&P (Signed)
  H and P has been reviewed and no changes are noted. This will be uploaded at a later date. 

## 2014-10-02 NOTE — Progress Notes (Addendum)
ANTIBIOTIC CONSULT NOTE - Follow up  Pharmacy Consult for Vancomycin/Zosyn Indication: Cellulitis  No Known Allergies  Patient Measurements: Height: 6\' 2"  (188 cm) Weight: (!) 370 lb (167.831 kg) IBW/kg (Calculated) : 82.2 Adjusted Body Weight: 116.4 kg  Vital Signs: Temp: 98.4 F (36.9 C) (09/01 0745) Temp Source: Oral (09/01 0745) BP: 128/62 mmHg (09/01 0809) Pulse Rate: 84 (09/01 0809) Intake/Output from previous day: 08/31 0701 - 09/01 0700 In: 2578.3 [P.O.:480; I.V.:898.3; IV Piggyback:1200] Out: 2700 [Urine:2700] Intake/Output from this shift: Total I/O In: -  Out: 700 [Urine:700]  Labs:  Recent Labs  09/30/14 1245 09/30/14 1723 10/01/14 0653  WBC 17.9* 16.8* 14.6*  HGB 11.4* 11.0* 10.7*  PLT 353 345 323  CREATININE 1.41* 1.31* 1.18   Estimated Creatinine Clearance: 121.9 mL/min (by C-G formula based on Cr of 1.18).  Recent Labs  10/01/14 2021  VANCOTROUGH 19     Kinetics:   Ke: 0.089 Vd: 81.5   Microbiology: Recent Results (from the past 720 hour(s))  Culture, blood (routine x 2)     Status: None (Preliminary result)   Collection Time: 09/30/14 12:45 PM  Result Value Ref Range Status   Specimen Description BLOOD  Final   Special Requests   Final    BOTTLES DRAWN AEROBIC AND ANAEROBIC 1CC AEROBIC,1CC ANAEROBIC   Culture NO GROWTH < 24 HOURS  Final   Report Status PENDING  Incomplete  Blood culture (routine x 2)     Status: None (Preliminary result)   Collection Time: 09/30/14  2:05 PM  Result Value Ref Range Status   Specimen Description BLOOD RIGHT ASSIST CONTROL  Final   Special Requests   Final    BOTTLES DRAWN AEROBIC AND ANAEROBIC  5 CC ANAERO 3 CC AERO   Culture NO GROWTH < 24 HOURS  Final   Report Status PENDING  Incomplete  Blood culture (routine x 2)     Status: None (Preliminary result)   Collection Time: 09/30/14  5:23 PM  Result Value Ref Range Status   Specimen Description BLOOD LEFT ARM  Final   Special Requests BOTTLES  DRAWN AEROBIC AND ANAEROBIC 10CC  Final   Culture NO GROWTH < 24 HOURS  Final   Report Status PENDING  Incomplete  MRSA PCR Screening     Status: None   Collection Time: 10/02/14  5:50 AM  Result Value Ref Range Status   MRSA by PCR NEGATIVE NEGATIVE Final    Comment:        The GeneXpert MRSA Assay (FDA approved for NASAL specimens only), is one component of a comprehensive MRSA colonization surveillance program. It is not intended to diagnose MRSA infection nor to guide or monitor treatment for MRSA infections.     Medical History: Past Medical History  Diagnosis Date  . Diabetes   . Hypertension   . Morbid obesity   . Systolic heart failure     Preserved EF 50-55%  . Chronic respiratory failure with hypoxia     2 L [place] continuously  . OSA on CPAP   . Arthritis     hands  . Cellulitis   . PVD (peripheral vascular disease)   . Hepatic steatosis     Medications:  Scheduled:  . aspirin EC  81 mg Oral Daily  . carvedilol  12.5 mg Oral BID  . enalapril  40 mg Oral BID  . furosemide  40 mg Oral BID  . gabapentin  300 mg Oral TID  . heparin  5,000 Units  Subcutaneous 3 times per day  . insulin aspart  0-20 Units Subcutaneous TID WC  . insulin aspart  0-5 Units Subcutaneous QHS  . insulin aspart  60 Units Subcutaneous TID WC  . insulin detemir  75 Units Subcutaneous BID  . mometasone-formoterol  2 puff Inhalation BID  . pantoprazole  40 mg Oral Daily  . piperacillin-tazobactam  4.5 g Intravenous 3 times per day  . potassium chloride  10 mEq Oral BID  . vancomycin  1,500 mg Intravenous Q8H   Infusions:  . sodium chloride 20 mL/hr at 10/02/14 0546   PRN: acetaminophen **OR** acetaminophen, HYDROcodone-acetaminophen, morphine injection, ondansetron **OR** ondansetron (ZOFRAN) IV  Assessment: 49 y/o M ordered empiric abx for cellulitis r/o osteomyelitis of right foot. MRI with evidence of osteomyelitis.  Goal of Therapy:  Vancomycin trough level 15-20  mcg/ml  Plan:  1. Vancomycin 1000 mg iv once given in ED. Current order for vancomyicn 1500 mg iv q 8 hours with stacked dosing.   8/31:   Vanc trough @ 20:30 = 19 mcg/mL, before 4th dose Will continue this pt on current dose of Vancomycin 1500 mg IV Q8H.  First trough not yet at steady state. Will order another trough for tonight at 2030 to confirm levels as pt at risk for accumulation. Will also check SCr with trough to monitor renal function.   2. Will continue Zosyn 4.5 g EI q 8 hours  Will continue to follow renal function and culture results.     Rocky Morel 10/02/2014,9:42 AM   Addendum: Pt in OR today resulting in delay of 1300 dose. Have retimed doses to be 8h apart from delayed dose. Will order next trough for 9/2 at 0730.   Rayna Sexton, PharmD, BCPS Clinical Pharmacist 10/02/2014 2:32 PM

## 2014-10-02 NOTE — Care Management Important Message (Signed)
Important Message  Patient Details  Name: Raymond [name] MRN: [id number] Date of Birth: Jan 25, 1966   Medicare Important Message Given:  Yes-second notification given    Juliann Pulse A Allmond 10/02/2014, 1:57 PM

## 2014-10-02 NOTE — Progress Notes (Addendum)
Clinical Education officer, museum (CSW) received call from [place] [phone] ext. [phone]. CSW made Tammy aware that patient was going to the OR today. Tammy requested patient's wound measurements, treatment, and IV antibiotics. Tammy's fax # [phone]. CSW faxed Tammy Dr. Bunnie Domino op note, wound measurements, and current med list today. CSW will continue to follow and assist as needed.   Blima Rich, [place] [phone]

## 2014-10-02 NOTE — Clinical Documentation Improvement (Addendum)
Hospitalist  Please clarify if the following diagnosis,  Sepsis was:   Present at the time of admission (POA)  NOT present at the time of admission and it developed during the inpatient stay  Unable to clinically determine whether the condition was present on admission.  Unknown   Supporting Information:   ED MD doc  "Sepsis"   Please exercise your independent, professional judgment when responding. A specific answer is not anticipated or expected.  NO SEPSIS   Thank You,  Raymond [name]

## 2014-10-02 NOTE — Anesthesia Procedure Notes (Signed)
Procedure Name: MAC Performed by: Doreen Salvage Pre-anesthesia Checklist: Patient identified, Emergency Drugs available, Suction available and Patient being monitored Patient Re-evaluated:Patient Re-evaluated prior to inductionOxygen Delivery Method: Nasal cannula

## 2014-10-03 ENCOUNTER — Encounter: Payer: Self-pay | Admitting: Vascular Surgery

## 2014-10-03 ENCOUNTER — Inpatient Hospital Stay: Payer: Medicare PPO

## 2014-10-03 LAB — SEDIMENTATION RATE: Sed Rate: 71 mm/hr — ABNORMAL HIGH (ref 0–20)

## 2014-10-03 LAB — GLUCOSE, CAPILLARY
Glucose-Capillary: 115 mg/dL — ABNORMAL HIGH (ref 65–99)
Glucose-Capillary: 206 mg/dL — ABNORMAL HIGH (ref 65–99)
Glucose-Capillary: 191 mg/dL — ABNORMAL HIGH (ref 65–99)
Glucose-Capillary: 184 mg/dL — ABNORMAL HIGH (ref 65–99)

## 2014-10-03 LAB — CREATININE, SERUM
Creatinine, Ser: 1.33 mg/dL — ABNORMAL HIGH (ref 0.61–1.24)
GFR calc Af Amer: >60 mL/min (ref >60)
GFR calc non Af Amer: >60 mL/min (ref >60)

## 2014-10-03 LAB — C-REACTIVE PROTEIN: CRP: 25.1 mg/dL — ABNORMAL HIGH (ref <1.0)

## 2014-10-03 LAB — VANCOMYCIN, TROUGH: Vancomycin Tr: 24 ug/mL (ref 10–20)

## 2014-10-03 MED ORDER — VANCOMYCIN HCL 10 G IV SOLR
1500.0000 mg | Freq: Two times a day (BID) | INTRAVENOUS | Status: DC
  Administered 2014-10-03 – 2014-10-04 (×2): 1500 mg via INTRAVENOUS
  Filled 2014-10-03 (×4): qty 1500

## 2014-10-03 MED ORDER — SENNOSIDES-DOCUSATE SODIUM 8.6-50 MG PO TABS
2.0000 | ORAL_TABLET | Freq: Two times a day (BID) | ORAL | Status: DC
  Administered 2014-10-03 – 2014-10-04 (×3): 2 via ORAL
  Filled 2014-10-03 (×3): qty 2

## 2014-10-03 MED ORDER — INSULIN DETEMIR 100 UNIT/ML ~~LOC~~ SOLN
75.0000 [IU] | Freq: Two times a day (BID) | SUBCUTANEOUS | Status: DC
  Administered 2014-10-03 – 2014-10-04 (×3): 75 [IU] via SUBCUTANEOUS
  Filled 2014-10-03 (×5): qty 0.75

## 2014-10-03 NOTE — Progress Notes (Signed)
Plan is for patient to D/C to [place] Saturday 10/04/14. Per [facility] admissions coordinator at [facility] D/C Summary is not needed today and can be faxed on Saturday to [phone]. Patient is going to room 80-B. [employer] authorization has been received. Auth # [id number]. Clinical Social Worker (CSW) made patient aware of above. Patient reported that he would get a friend to bring his tribology machine from home to [employer]. MD is aware of above. CSW will continue to follow and assist as needed.   Blima Rich, [place] [phone]

## 2014-10-03 NOTE — Progress Notes (Signed)
[facility] [place] NAME: Raymond [name]    MR#:  [id number]  DATE OF BIRTH:  10/05/1965  SUBJECTIVE:  Feeling better, REVIEW OF SYSTEMS:    Review of Systems  Constitutional: Negative for fever, chills and malaise/fatigue.  HENT: Negative for sore throat.   Eyes: Negative for blurred vision.  Respiratory: Negative for cough, hemoptysis, shortness of breath and wheezing.   Cardiovascular: Negative for chest pain, palpitations and leg swelling.  Gastrointestinal: Negative for nausea, vomiting, abdominal pain, diarrhea and blood in stool.  Genitourinary: Negative for dysuria.  Musculoskeletal: Negative for back pain.  Skin:       Bilateral foot dressing in place  Neurological: Negative for dizziness, tremors and headaches.  Endo/Heme/Allergies: Does not bruise/bleed easily.    Tolerating Diet:yes DRUG ALLERGIES:  No Known Allergies VITALS:  Blood pressure 119/45, pulse 72, temperature 99.3 F (37.4 C), temperature source Oral, resp. rate 18, height 6\' 2"  (1.88 m), weight 167.831 kg (370 lb), SpO2 96 %. PHYSICAL EXAMINATION:  Physical Exam  Constitutional: He is oriented to person, place, and time and well-developed, well-nourished, and in no distress. No distress.  HENT:  Head: Normocephalic.  Eyes: No scleral icterus.  Neck: Normal range of motion. Neck supple. No JVD present. No tracheal deviation present.  Cardiovascular: Normal rate, regular rhythm and normal heart sounds.  Exam reveals no gallop and no friction rub.   No murmur heard. Pulmonary/Chest: Effort normal and breath sounds normal. No respiratory distress. He has no wheezes. He has no rales. He exhibits no tenderness.  Abdominal: Soft. Bowel sounds are normal. He exhibits no distension and no mass. There is no tenderness. There is no rebound and no guarding.  Musculoskeletal: Normal range of motion. He exhibits no edema.  Neurological: He is alert and  oriented to person, place, and time.  Skin: Skin is warm. No rash noted. No erythema.  Both feet are wrapped in Kerlix dressing  Psychiatric: Affect and judgment normal.   LABORATORY PANEL:   CBC  Recent Labs Lab 10/01/14 0653  WBC 14.6*  HGB 10.7*  HCT 32.8*  PLT 323   ------------------------------------------------------------------------------------------------------------------  Chemistries   Recent Labs Lab 10/01/14 0653 10/03/14 0730  NA 138  --   K 4.3  --   CL 104  --   CO2 28  --   GLUCOSE 211*  --   BUN 17  --   CREATININE 1.18 1.33*  CALCIUM 8.3*  --     RADIOLOGY:  Mr Foot Right W Wo Contrast  10/01/2014  MPRESSION: 1. Sinus tract associated with the nonhealing wound along the plantar aspect of the first MTP joint which extends to the tibial sesamoid and likely into the first metatarsal phalangeal joint. 2. There is associated osteomyelitis of the tibial sesamoid. There is possible early osteomyelitis of the fibular sesamoid and remaining base of the proximal phalanx. 3. Heterogeneous soft tissue enhancement surrounding the first MTP joint with probable small abscesses or areas of devascularization.   Electronically Signed   By: Richardean Sale M.D.   On: 10/01/2014 10:24   ASSESSMENT AND PLAN:   49 year old male with diabetes and history of previous amputation of both great toes who presented with foul odor from right foot and redness.  1. Osteomyelitis of right foot, tibial sesamoid and possible fibular sesamoid and proximal phalanx: Patient was seen and evaluated by podiatry. Patient underwent debridement.  He underwent amputation of right foot including removal of the  residual proximal phalanx metatarsal head and both sesamoids for further management of the osteomyelitis. Continue broad-spectrum antibiotics with Zosyn and vancomycin.  Appreciate infectious disease input. - Will likely need 4-6 weeks of IV antibiotic.  We will go and order PICC line at this  time.  Likely need rehabilitation  2. 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 A1c is 6.6.  3. Essential hypertension: Continue Coreg and enalapril. Blood pressure is acceptable.  4. Acute kidney injury: Creatinine is improved with IV fluids. I will discontinue IV fluids due to history of systolic heart failure.  5. OSA: Continue CPAP.  6. Systolic heart failure: Patient does not appear to be in exacerbation at this time. Continue to monitor.    Physical therapy recommended skilled nursing facility.  We will consult social worker for placement  Management plans discussed with the patient and he is in agreement.  CODE STATUS: full  TOTAL TIME TAKING CARE OF THIS PATIENT: 30 minutes.     POSSIBLE D/C 1-2 days, DEPENDING ON CLINICAL CONDITION.  And placement   [facility], [place] Kinyon M.D on 10/03/2014 at 2:35 PM  Between 7am to 6pm - Pager - [phone] After 6pm go to [email or web address] - password [email or web address]  [place] [facility]  Office  [phone]  CC: Primary care physician; Tomasita Morrow, MD

## 2014-10-03 NOTE — Progress Notes (Signed)
Discussed with podiatry, Dr. Elvina Mattes and Dr. Ola Spurr.  We will plan for discharge tomorrow morning.  Activity: weightbearing limited only to the bathroom usage was nonweightbearing on surgical leg until followed by Dr. Elvina Mattes in the office  Recommend wedge shoes on the right foot and Regular postoperative shows on the left foot  He recommends daily dry dressing changes - follow-up in 10-14 days at podiatry office  We will await infectious disease input of antibiotic through PICC line. Discussed with Education officer, museum

## 2014-10-03 NOTE — Progress Notes (Signed)
Infectious Disease Long Term IV Antibiotic Orders  Diagnosis BIL LE Ulcers, R osteo  Culture results Results for orders placed or performed during the hospital encounter of 09/30/14  Culture, blood (routine x 2)     Status: None (Preliminary result)   Collection Time: 09/30/14 12:45 PM  Result Value Ref Range Status   Specimen Description BLOOD  Final   Special Requests   Final    BOTTLES DRAWN AEROBIC AND ANAEROBIC 1CC AEROBIC,1CC ANAEROBIC   Culture NO GROWTH 3 DAYS  Final   Report Status PENDING  Incomplete  Blood culture (routine x 2)     Status: None (Preliminary result)   Collection Time: 09/30/14  2:05 PM  Result Value Ref Range Status   Specimen Description BLOOD RIGHT ASSIST CONTROL  Final   Special Requests   Final    BOTTLES DRAWN AEROBIC AND ANAEROBIC  5 CC ANAERO 3 CC AERO   Culture NO GROWTH 3 DAYS  Final   Report Status PENDING  Incomplete  Blood culture (routine x 2)     Status: None (Preliminary result)   Collection Time: 09/30/14  5:23 PM  Result Value Ref Range Status   Specimen Description BLOOD LEFT ARM  Final   Special Requests BOTTLES DRAWN AEROBIC AND ANAEROBIC 10CC  Final   Culture NO GROWTH 3 DAYS  Final   Report Status PENDING  Incomplete  MRSA PCR Screening     Status: None   Collection Time: 10/02/14  5:50 AM  Result Value Ref Range Status   MRSA by PCR NEGATIVE NEGATIVE Final    Comment:        The GeneXpert MRSA Assay (FDA approved for NASAL specimens only), is one component of a comprehensive MRSA colonization surveillance program. It is not intended to diagnose MRSA infection nor to guide or monitor treatment for MRSA infections.   Wound culture     Status: None (Preliminary result)   Collection Time: 10/02/14  2:23 PM  Result Value Ref Range Status   Specimen Description WOUND  Final   Special Requests NONE  Final   Gram Stain PENDING  Incomplete   Culture NO GROWTH 1 DAY  Final   Report Status PENDING  Incomplete    Allergies:  No Known Allergies  Discharge antibiotics Vancomycin     1500 mg  every     12    hours .     Goal vancomycin trough 15-20.    Pharmacy to adjust dosing based on levels  Zosyn 3.375 grams every 8 hours   PICC Care per protocol Labs weekly while on IV antibiotics       CBC w diff   Comprehensive met panel Vancomycin Trough    Stop date 11/13/14 Follow up clinic date TBD FAX weekly labs to [phone]  Leonel Ramsay, MD

## 2014-10-03 NOTE — Evaluation (Signed)
Physical Therapy Evaluation Patient Details Name: Raymond [name] MRN: [id number] DOB: 28-Apr-1965 Today's Date: 10/03/2014   History of Present Illness  Pt is a 49 yo male who was admitted to the hospital s/p 1st ray amputation on R foot and angiogram, both performed on 10/02/14. 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   Clinical Impression  Pt presents with hx of morbid obesity, DM, systolic heart failure, PVD, cellulitis, and HTN. Examination reveals that pt performs bed mobility, transfers and ambulation at min assist +2 for support/safety. Pt displays general weakness in LEs as well as significant gait impairments secondary to weakness and chronic/acute amputations in bilateral feet. After this most recent amputation, pt is significantly removed from his baseline and will benefit from skilled PT in order to address these deficits and eventually return him home safely. Pt is very pleasant and motivated to participate in therapy.     Follow Up Recommendations SNF    Equipment Recommendations   (Bariatric RW)    Recommendations for Other Services       Precautions / Restrictions Precautions Precautions: Fall Restrictions Weight Bearing Restrictions: Yes Other Position/Activity Restrictions: WBAT      Mobility  Bed Mobility Overal bed mobility: Needs Assistance Bed Mobility: Supine to Sit     Supine to sit: Min assist     General bed mobility comments: Pt requires assist for LEs due to weakness, but he has good UE strength that allows him to manage getting his trunk into upright with use of the side rails   Transfers Overall transfer level: Needs assistance Equipment used: Rolling walker (2 wheeled) Transfers: Sit to/from Stand Sit to Stand: +2 safety/equipment;Min assist;+2 physical assistance         General transfer comment: Pt requires assist to get into standing and cueing for hand and foot placement prior to transfer. Pt shows good balance once in  standing with no LOB.   Ambulation/Gait Ambulation/Gait assistance: Min assist;+2 physical assistance;+2 safety/equipment Ambulation Distance (Feet): 3 Feet Assistive device:  (Bariatric RW) Gait Pattern/deviations: Step-to pattern;Decreased step length - right;Decreased step length - left;Shuffle;Decreased stride length Gait velocity: decreased Gait velocity interpretation: <1.8 ft/sec, indicative of risk for recurrent falls General Gait Details: Pt requires cueing for sequencing of RW as well as assist for management and direction of RW. Occasional min assist for balance correction laterally  Stairs            Wheelchair Mobility    Modified Rankin (Stroke Patients Only)       Balance Overall balance assessment: No apparent balance deficits (not formally assessed) (Minor staggering in ambulation - possibly from post-op shoes)                                           Pertinent Vitals/Pain Pain Assessment: No/denies pain    Home Living Family/patient expects to be discharged to:: Private residence Living Arrangements: Non-relatives/Friends Insurance underwriter ) Available Help at Discharge: Available 24 hours/day;Friend(s) Type of Home: Apartment Home Access: Level entry     Home Layout: One level   Additional Comments: Pt states his apartment is too small for him to be able to navigate     Prior Function Level of Independence: Independent         Comments: Pt was walking up to 2 miles per day for exercise prior to foot complications     Hand Dominance  Extremity/Trunk Assessment   Upper Extremity Assessment: Overall WFL for tasks assessed           Lower Extremity Assessment: Generalized weakness (Pt demonstrates gross MMT 3+/5 in bilateral LEs)         Communication   Communication: No difficulties  Cognition Arousal/Alertness: Awake/alert Behavior During Therapy: WFL for tasks assessed/performed Overall Cognitive Status:  Within Functional Limits for tasks assessed                      General Comments      Exercises Other Exercises Other Exercises: Pt performed bilateral therex x 12 reps at supervision for proper technique. Exercises included: LAQ, knee marching, ankle pumps, SLR, and hip abd/add      Assessment/Plan    PT Assessment Patient needs continued PT services  PT Diagnosis Difficulty walking;Abnormality of gait;Generalized weakness   PT Problem List Decreased strength;Decreased range of motion;Decreased activity tolerance;Decreased mobility;Decreased balance;Obesity;Pain  PT Treatment Interventions DME instruction;Gait training;Stair training;Functional mobility training;Therapeutic activities;Therapeutic exercise;Balance training;Neuromuscular re-education;Patient/family education;Wheelchair mobility training;Manual techniques;Modalities   PT Goals (Current goals can be found in the Care Plan section) Acute Rehab PT Goals Patient Stated Goal: to go where he's safe PT Goal Formulation: With patient Time For Goal Achievement: 10/17/14 Potential to Achieve Goals: Good    Frequency 7X/week   Barriers to discharge  (Small apartment)      Co-evaluation               End of Session Equipment Utilized During Treatment: Gait belt (2 gait belts required) Activity Tolerance: Patient tolerated treatment well Patient left: in chair;with chair alarm set;with call bell/phone within reach Nurse Communication: Mobility status         Time: 1660-6004 PT Time Calculation (min) (ACUTE ONLY): 27 min   Charges:         PT G CodesJanyth Contes 10-28-14, 10:41 AM  Janyth Contes, SPT. [phone]

## 2014-10-03 NOTE — Progress Notes (Signed)
Patient Demographics  Raymond [name], is a 49 y.o. 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 Diabetes   . Hypertension   . Morbid obesity   . Systolic heart failure     Preserved EF 50-55%  . Chronic respiratory failure with hypoxia     2 L [place] continuously  . OSA on CPAP   . Arthritis     hands  . Cellulitis   . PVD (peripheral vascular disease)   . Hepatic steatosis       Past Surgical History  Procedure Laterality Date  . Eye surgery Left     bilat laser  . 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 Toe amputation Right 2009  . Cholecystectomy    . Hip surgery Right     8 th grade pins  . Peripheral vascular catheterization Right 10/02/2014    Procedure: Lower Extremity Angiography;  Surgeon: Algernon Huxley, MD;  Location: [facility];  Service: Cardiovascular;  Laterality: Right;    in for   Chief Complaint  Patient presents with  . Wound Infection  . Foot Pain     HPI      Review of Systems    In addition to the HPI above,  No Fever-chills, No Headache, No changes with Vision or hearing, No problems swallowing food or Liquids, No Chest pain, Cough or Shortness of Breath, No Abdominal pain, No Nausea or Vommitting, Bowel movements are regular, No Blood in stool or Urine, No dysuria, No new skin rashes or bruises, No new joints pains-aches,  No new weakness, tingling, numbness in any extremity, No recent weight gain or loss, No polyuria, polydypsia or polyphagia, No significant Mental Stressors.  A full 10  point Review of Systems was done, except as stated above, all other Review of Systems were negative.   Social History Social History  Substance Use Topics  . Smoking status: Never Smoker   . Smokeless tobacco: Never Used  . Alcohol Use: No    Family History Family History  Problem Relation Age of Onset  . Hypertension Mother   . Diabetes Mother   . Stroke Father   . Hypertension Father   . Heart attack Father     Prior to Admission medications   Medication Sig Start Date End Date Taking? Authorizing Provider  aspirin EC 81 MG tablet Take 81 mg by mouth daily.   Yes Historical Provider, MD  carvedilol (COREG) 12.5 MG tablet Take 12.5 mg by mouth 2 (two) times daily.    Yes Historical Provider, MD  enalapril (VASOTEC) 20 MG tablet Take 40 mg by mouth 2 (two) times daily.    Yes Historical Provider, MD  Fluticasone-Salmeterol (ADVAIR  DISKUS) 500-50 MCG/DOSE AEPB Inhale 1 puff into the lungs 2 (two) times daily. Rinse and gargle after each use. Patient taking differently: Inhale 1 puff into the lungs 2 (two) times daily.  07/07/14  Yes Vishal Mungal, MD  furosemide (LASIX) 40 MG tablet Take 1 tablet (40 mg total) by mouth 2 (two) times daily as needed. Patient taking differently: Take 40 mg by mouth 2 (two) times daily as needed for edema.  05/06/14  Yes Minna Merritts, MD  gabapentin (NEURONTIN) 300 MG capsule Take 300 mg by mouth 3 (three) times daily.   Yes Historical Provider, MD  insulin detemir (LEVEMIR) 100 UNIT/ML injection Inject 75 Units into the skin 2 (two) times daily.    Yes Historical Provider, MD  insulin lispro (HUMALOG) 100 UNIT/ML cartridge Inject 60 Units into the skin 3 (three) times daily with meals.   Yes Historical Provider, MD  metFORMIN (GLUCOPHAGE) 1000 MG tablet Take 1,000 mg by mouth 2 (two) times daily with a meal.   Yes Historical Provider, MD  pantoprazole (PROTONIX) 40 MG tablet Take 40 mg by mouth daily.   Yes Historical Provider, MD  potassium chloride  (MICRO-K) 10 MEQ CR capsule Take 10 mEq by mouth 2 (two) times daily.   Yes Historical Provider, MD    Anti-infectives    Start     Dose/Rate Route Frequency Ordered Stop   10/03/14 2008  vancomycin (VANCOCIN) 1,500 mg in sodium chloride 0.9 % 500 mL IVPB     1,500 mg 250 mL/hr over 120 Minutes Intravenous Every 12 hours 10/03/14 1039     10/02/14 1115  ceFAZolin (ANCEF) IVPB 1 g/50 mL premix     1 g 100 mL/hr over 30 Minutes Intravenous  Once 10/02/14 1112 10/02/14 1143   09/30/14 2200  piperacillin-tazobactam (ZOSYN) IVPB 4.5 g     4.5 g 25 mL/hr over 240 Minutes Intravenous 3 times per day 09/30/14 1752     09/30/14 2100  vancomycin (VANCOCIN) 1,500 mg in sodium chloride 0.9 % 500 mL IVPB  Status:  Discontinued     1,500 mg 250 mL/hr over 120 Minutes Intravenous Every 8 hours 09/30/14 1752 10/03/14 1039   09/30/14 1415  vancomycin (VANCOCIN) IVPB 1000 mg/200 mL premix     1,000 mg 200 mL/hr over 60 Minutes Intravenous  Once 09/30/14 1401 09/30/14 1614   09/30/14 1400  piperacillin-tazobactam (ZOSYN) IVPB 3.375 g     3.375 g 12.5 mL/hr over 240 Minutes Intravenous  Once 09/30/14 1354 09/30/14 2010      Scheduled Meds: . aspirin EC  81 mg Oral Daily  . carvedilol  12.5 mg Oral BID  . Chlorhexidine Gluconate Cloth  6 each Topical Once  . enalapril  40 mg Oral BID  . furosemide  40 mg Oral BID  . gabapentin  300 mg Oral TID  . heparin  5,000 Units Subcutaneous 3 times per day  . insulin aspart  0-20 Units Subcutaneous TID WC  . insulin aspart  0-5 Units Subcutaneous QHS  . insulin aspart  10 Units Subcutaneous TID WC  . insulin detemir  75 Units Subcutaneous BID  . mometasone-formoterol  2 puff Inhalation BID  . pantoprazole  40 mg Oral Daily  . piperacillin-tazobactam  4.5 g Intravenous 3 times per day  . potassium chloride  10 mEq Oral BID  . vancomycin  1,500 mg Intravenous Q12H   Continuous Infusions:  PRN Meds:.acetaminophen **OR** acetaminophen, fentaNYL (SUBLIMAZE)  injection, HYDROcodone-acetaminophen, morphine injection, ondansetron **OR** ondansetron (  ZOFRAN) IV, ondansetron (ZOFRAN) IV  No Known Allergies  Physical Exam  Vitals  Blood pressure 119/45, pulse 72, temperature 99.3 F (37.4 C), temperature source Oral, resp. rate 18, height 6\' 2"  (1.88 m), weight 167.831 kg (370 lb), SpO2 96 %.  Lower Extremity exam:  Vascular: DP and PT pulses are difficult to palpate but are intact.  Dermatological: Incision margin to the right amputation revision on the first ray of the right first metatarsal is intact and stable with no evidence of drainage or dehiscence. Cellulitis is much improved from overall standpoint. Left foot still has an ulceration was well-padded at this juncture and has a granular base. His approximately 2 cm x 2.5 cm  Neurological: Severe neuropathic changes to both feet and lower legs  Ortho: First ray amputation to remove metatarsal head infected sesamoids anti-infectives base of proximal phalanx from the right foot. Previous amputation to the partial left hallux  Data Review  CBC  Recent Labs Lab 09/30/14 1245 09/30/14 1723 10/01/14 0653  WBC 17.9* 16.8* 14.6*  HGB 11.4* 11.0* 10.7*  HCT 35.8* 34.8* 32.8*  PLT 353 345 323  MCV 81.0 81.9 81.2  MCH 25.9* 25.9* 26.5  MCHC 31.9* 31.6* 32.6  RDW 15.0* 15.7* 15.0*   ------------------------------------------------------------------------------------------------------------------  Chemistries   Recent Labs Lab 09/30/14 1245 09/30/14 1723 10/01/14 0653 10/03/14 0730  NA 136  --  138  --   K 4.3  --  4.3  --   CL 100*  --  104  --   CO2 29  --  28  --   GLUCOSE 195*  --  211*  --   BUN 23*  --  17  --   CREATININE 1.41* 1.31* 1.18 1.33*  CALCIUM 8.6*  --  8.3*  --    ------------------------------------------------------------------------------------------------------------------ estimated creatinine clearance is 108.2 mL/min (by C-G formula based on Cr of  1.33).  Coagulation profile No results for input(s): INR, PROTIME in the last 168 hours. ------------------------------------------------------------------------------------------------------------------- No results for input(s): DDIMER in the last 72 hours. -------------------------------------------------------------------------------------------------------------------  ---------------------------------------------------------------------------------------------------------------  Urinalysis No results found for: COLORURINE, APPEARANCEUR, LABSPEC, PHURINE, GLUCOSEU, HGBUR, BILIRUBINUR, KETONESUR, PROTEINUR, UROBILINOGEN, NITRITE, LEUKOCYTESUR    Assessment & Plan: Overall the patient looks very stable the right foot at this juncture. Recommend he continue using intravenous antibiotics. He is scheduled for a PICC line today. We'll likely be transferred to a rehabilitation facility for a few weeks until this wound heals. Continues need daily dressing changes on the left foot. Dr. Cleda Mccreedy will follow over the weekend.  Active Problems:   Cellulitis of right foot     Family Communication: Plan discussed with patient and **   Thank you for the consult, we will follow the patient with you in the Hospital.   Perry Mount M.D on 10/03/2014 at 10:53 AM  Thank you for the consult, we will follow the patient with you in the Hospital.

## 2014-10-03 NOTE — Progress Notes (Signed)
ANTIBIOTIC CONSULT NOTE - Follow up  Pharmacy Consult for Vancomycin/Zosyn Indication: Cellulitis  No Known Allergies  Patient Measurements: Height: 6\' 2"  (188 cm) Weight: (!) 370 lb (167.831 kg) IBW/kg (Calculated) : 82.2 Adjusted Body Weight: 116.4 kg  Vital Signs: Temp: 99.3 F (37.4 C) (09/02 0755) Temp Source: Oral (09/02 0755) BP: 119/45 mmHg (09/02 0755) Pulse Rate: 72 (09/02 0755) Intake/Output from previous day: 09/01 0701 - 09/02 0700 In: 2200 [I.V.:400; IV Piggyback:1800] Out: 2325 [Urine:2250; Blood:75] Intake/Output from this shift: Total I/O In: 240 [P.O.:240] Out: -   Labs:  Recent Labs  09/30/14 1245 09/30/14 1723 10/01/14 0653 10/03/14 0730  WBC 17.9* 16.8* 14.6*  --   HGB 11.4* 11.0* 10.7*  --   PLT 353 345 323  --   CREATININE 1.41* 1.31* 1.18 1.33*   Estimated Creatinine Clearance: 108.2 mL/min (by C-G formula based on Cr of 1.33).  Recent Labs  10/01/14 2021 10/03/14 0730  VANCOTROUGH 19 24*     Kinetics:   Ke: 0.089 Vd: 81.5   Microbiology: Recent Results (from the past 720 hour(s))  Culture, blood (routine x 2)     Status: None (Preliminary result)   Collection Time: 09/30/14 12:45 PM  Result Value Ref Range Status   Specimen Description BLOOD  Final   Special Requests   Final    BOTTLES DRAWN AEROBIC AND ANAEROBIC 1CC AEROBIC,1CC ANAEROBIC   Culture NO GROWTH 3 DAYS  Final   Report Status PENDING  Incomplete  Blood culture (routine x 2)     Status: None (Preliminary result)   Collection Time: 09/30/14  2:05 PM  Result Value Ref Range Status   Specimen Description BLOOD RIGHT ASSIST CONTROL  Final   Special Requests   Final    BOTTLES DRAWN AEROBIC AND ANAEROBIC  5 CC ANAERO 3 CC AERO   Culture NO GROWTH 3 DAYS  Final   Report Status PENDING  Incomplete  Blood culture (routine x 2)     Status: None (Preliminary result)   Collection Time: 09/30/14  5:23 PM  Result Value Ref Range Status   Specimen Description BLOOD LEFT  ARM  Final   Special Requests BOTTLES DRAWN AEROBIC AND ANAEROBIC 10CC  Final   Culture NO GROWTH 3 DAYS  Final   Report Status PENDING  Incomplete  MRSA PCR Screening     Status: None   Collection Time: 10/02/14  5:50 AM  Result Value Ref Range Status   MRSA by PCR NEGATIVE NEGATIVE Final    Comment:        The GeneXpert MRSA Assay (FDA approved for NASAL specimens only), is one component of a comprehensive MRSA colonization surveillance program. It is not intended to diagnose MRSA infection nor to guide or monitor treatment for MRSA infections.     Medical History: Past Medical History  Diagnosis Date  . Diabetes   . Hypertension   . Morbid obesity   . Systolic heart failure     Preserved EF 50-55%  . Chronic respiratory failure with hypoxia     2 L [place] continuously  . OSA on CPAP   . Arthritis     hands  . Cellulitis   . PVD (peripheral vascular disease)   . Hepatic steatosis     Medications:  Scheduled:  . aspirin EC  81 mg Oral Daily  . carvedilol  12.5 mg Oral BID  . Chlorhexidine Gluconate Cloth  6 each Topical Once  . enalapril  40 mg Oral BID  .  furosemide  40 mg Oral BID  . gabapentin  300 mg Oral TID  . heparin  5,000 Units Subcutaneous 3 times per day  . insulin aspart  0-20 Units Subcutaneous TID WC  . insulin aspart  0-5 Units Subcutaneous QHS  . insulin aspart  10 Units Subcutaneous TID WC  . insulin detemir  75 Units Subcutaneous BID  . mometasone-formoterol  2 puff Inhalation BID  . pantoprazole  40 mg Oral Daily  . piperacillin-tazobactam  4.5 g Intravenous 3 times per day  . potassium chloride  10 mEq Oral BID  . vancomycin  1,500 mg Intravenous Q12H   Infusions:    PRN: acetaminophen **OR** acetaminophen, fentaNYL (SUBLIMAZE) injection, HYDROcodone-acetaminophen, morphine injection, ondansetron **OR** ondansetron (ZOFRAN) IV, ondansetron (ZOFRAN) IV  Assessment: 49 y/o M ordered empiric abx for cellulitis r/o osteomyelitis of right  foot. MRI with evidence of osteomyelitis.  Goal of Therapy:  Vancomycin trough level 15-20 mcg/ml  Plan:  1. Vancomycin 1000 mg iv once given in ED. Current order for vancomyicn 1500 mg iv q 8 hours with stacked dosing.   8/31:   Vanc trough @ 20:30 = 19 mcg/mL, before 4th dose Will continue this pt on current dose of Vancomycin 1500 mg IV Q8H.  First trough not yet at steady state. Will order another trough for tonight at 2030 to confirm levels as pt at risk for accumulation. Will also check SCr with trough to monitor renal function.   9/2: Vanc trough at 0730 = 24 mcg/ml. SCr has bumped slightly to 1.33. Noted that patient also received contrast on 9/1. Will decrease Vancomycin 1500mg  IV q12h. Will watch SCr closely and recheck trough on 9/4 at 0730.  2. Will continue Zosyn 4.5 g EI q 8 hours  Will continue to follow renal function and culture results.     Paulina Fusi, PharmD, BCPS 10/03/2014 10:43 AM

## 2014-10-03 NOTE — Progress Notes (Signed)
Inpatient Diabetes Program Recommendations  AACE/ADA: New Consensus Statement on Inpatient Glycemic Control (2013)  Target Ranges:  Prepandial:   less than 140 mg/dL      Peak postprandial:   less than 180 mg/dL (1-2 hours)      Critically ill patients:  140 - 180 mg/dL   Review of Glycemic control:  Results for DAYQUAN, BUYS (MRN [id number]) as of 10/03/2014 09:45  Ref. Range 10/01/2014 11:28 10/01/2014 16:12 10/01/2014 21:01 10/02/2014 16:19 10/02/2014 21:09  Glucose-Capillary Latest Ref Range: 65-99 mg/dL 230 (H) 99 116 (H) 108 (H) 202 (H)  Results for ORENTHAL, DEBSKI [facility] (MRN [id number]) as of 10/03/2014 09:45  Ref. Range 09/30/2014 17:23  Hemoglobin A1C Latest Ref Range: 4.0-6.0 % 6.6 (H)   Home diabetes medication:  Levemir 75 units bid, Humalog 60 units tid with meals, Metformin 1000 mg bid  Hospital diabetes medications:   Levemir 75 units bid, Novolog 10 units tid with meals, and Novolog resistant tid with meals.   Note that A1C indicates well controlled diabetes prior to admit.  Levemir insulin was held yesterday due to NPO status but restarted today.  Agree with current inpatient diabetes medication regimen.    Thanks, Adah Perl, RN, BC-ADM Inpatient Diabetes Coordinator Pager [phone] (8a-5p)

## 2014-10-03 NOTE — Progress Notes (Signed)
[facility] INFECTIOUS DISEASE PROGRESS NOTE Date of Admission:  09/30/2014     ID: Raymond [name] is a 49 y.o. 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 Picc place.   ROS  Eleven systems are reviewed and negative except per hpi  Medications:  Antibiotics Given (last 72 hours)    Date/Time Action Medication Dose Rate   09/30/14 2047 Given   vancomycin (VANCOCIN) 1,500 mg in sodium chloride 0.9 % 500 mL IVPB 1,500 mg 250 mL/hr   09/30/14 2254 Given   piperacillin-tazobactam (ZOSYN) IVPB 4.5 g 4.5 g 25 mL/hr   10/01/14 0427 Given   vancomycin (VANCOCIN) 1,500 mg in sodium chloride 0.9 % 500 mL IVPB 1,500 mg 250 mL/hr   10/01/14 0524 Given   piperacillin-tazobactam (ZOSYN) IVPB 4.5 g 4.5 g 25 mL/hr   10/01/14 1308 Given   vancomycin (VANCOCIN) 1,500 mg in sodium chloride 0.9 % 500 mL IVPB 1,500 mg 250 mL/hr   10/01/14 1420 Given   piperacillin-tazobactam (ZOSYN) IVPB 4.5 g 4.5 g 25 mL/hr   10/01/14 2132 Given   vancomycin (VANCOCIN) 1,500 mg in sodium chloride 0.9 % 500 mL IVPB 1,500 mg 250 mL/hr   10/01/14 2132 Given   piperacillin-tazobactam (ZOSYN) IVPB 4.5 g 4.5 g 25 mL/hr   10/02/14 0546 Given   vancomycin (VANCOCIN) 1,500 mg in sodium chloride 0.9 % 500 mL IVPB 1,500 mg 250 mL/hr   10/02/14 0546 Given   piperacillin-tazobactam (ZOSYN) IVPB 4.5 g 4.5 g 25 mL/hr   10/02/14 1113 Given   ceFAZolin (ANCEF) IVPB 1 g/50 mL premix 1 g 100 mL/hr   10/02/14 1638 Given  [Pt in OR]   vancomycin (VANCOCIN) 1,500 mg in sodium chloride 0.9 % 500 mL IVPB 1,500 mg 250 mL/hr   10/02/14 1638 Given  [Pt in OR]   piperacillin-tazobactam (ZOSYN) IVPB 4.5 g 4.5 g 25 mL/hr   10/02/14 2317 Given   vancomycin (VANCOCIN) 1,500 mg in sodium chloride 0.9 % 500 mL IVPB 1,500 mg 250 mL/hr   10/02/14 2317 Given   piperacillin-tazobactam (ZOSYN) IVPB 4.5 g 4.5 g 25 mL/hr   10/03/14 0526 Given   piperacillin-tazobactam (ZOSYN) IVPB 4.5 g 4.5 g 25 mL/hr   10/03/14 0808 Given   vancomycin (VANCOCIN) 1,500 mg in sodium chloride 0.9 % 500 mL IVPB 1,500 mg 250 mL/hr   10/03/14 1424 Given   piperacillin-tazobactam (ZOSYN) IVPB 4.5 g 4.5 g 25 mL/hr     . aspirin EC  81 mg Oral Daily  . carvedilol  12.5 mg Oral BID  . Chlorhexidine Gluconate Cloth  6 each Topical Once  . enalapril  40 mg Oral BID  . furosemide  40 mg Oral BID  . gabapentin  300 mg Oral TID  . heparin  5,000 Units Subcutaneous 3 times per day  . insulin aspart  0-20 Units Subcutaneous TID WC  . insulin aspart  0-5 Units Subcutaneous QHS  . insulin aspart  10 Units Subcutaneous TID WC  . insulin detemir  75 Units Subcutaneous BID  . mometasone-formoterol  2 puff Inhalation BID  . pantoprazole  40 mg Oral Daily  . piperacillin-tazobactam  4.5 g Intravenous 3 times per day  . potassium chloride  10 mEq Oral BID  . senna-docusate  2 tablet Oral BID  . vancomycin  1,500 mg Intravenous Q12H    Objective: Vital signs in last 24 hours:  Temp:  [98.4 F (36.9 C)-99.7 F (37.6 C)] 99.3 F (37.4 C) (09/02 0755) Pulse Rate:  [65-88] 72 (09/02 0755) Resp:  [16-20] 18 (09/02 0755) BP: (116-154)/(40-99) 119/45 mmHg (09/02 0755) SpO2:  [92 %-100 %] 96 % (09/02 0755) Constitutional: He is oriented to person, place, and time. He appears well-developed and well-nourished- obese. No distress.  HENT:  Mouth/Throat: Oropharynx is clear and moist. No oropharyngeal exudate.  Cardiovascular: Normal rate, regular rhythm and normal heart sounds. Exam reveals no gallop and no friction rub.  No murmur heard.  Pulmonary/Chest: Effort normal and breath sounds normal. No respiratory distress. He has no wheezes.  Abdominal: Soft. Bowel sounds are normal. He exhibits no distension. There is no tenderness.  Lymphadenopathy:  He has no cervical adenopathy.  Neurological: He is alert and oriented to person, place,  and time. B LE sensation loss Skin: Skin is warm and dry. bil feet are wrapped post op EXt 2 + edema bil LE Psychiatric: He has a normal mood and affect. His behavior is normal.   Lab Results  Recent Labs  09/30/14 1723 10/01/14 0653 10/03/14 0730  WBC 16.8* 14.6*  --   HGB 11.0* 10.7*  --   HCT 34.8* 32.8*  --   NA  --  138  --   K  --  4.3  --   CL  --  104  --   CO2  --  28  --   BUN  --  17  --   CREATININE 1.31* 1.18 1.33*    Microbiology: Results for orders placed or performed during the hospital encounter of 09/30/14  Culture, blood (routine x 2)     Status: None (Preliminary result)   Collection Time: 09/30/14 12:45 PM  Result Value Ref Range Status   Specimen Description BLOOD  Final   Special Requests   Final    BOTTLES DRAWN AEROBIC AND ANAEROBIC 1CC AEROBIC,1CC ANAEROBIC   Culture NO GROWTH 3 DAYS  Final   Report Status PENDING  Incomplete  Blood culture (routine x 2)     Status: None (Preliminary result)   Collection Time: 09/30/14  2:05 PM  Result Value Ref Range Status   Specimen Description BLOOD RIGHT ASSIST CONTROL  Final   Special Requests   Final    BOTTLES DRAWN AEROBIC AND ANAEROBIC  5 CC ANAERO 3 CC AERO   Culture NO GROWTH 3 DAYS  Final   Report Status PENDING  Incomplete  Blood culture (routine x 2)     Status: None (Preliminary result)   Collection Time: 09/30/14  5:23 PM  Result Value Ref Range Status   Specimen Description BLOOD LEFT ARM  Final   Special Requests BOTTLES DRAWN AEROBIC AND ANAEROBIC 10CC  Final   Culture NO GROWTH 3 DAYS  Final   Report Status PENDING  Incomplete  MRSA PCR Screening     Status: None   Collection Time: 10/02/14  5:50 AM  Result Value Ref Range Status   MRSA by PCR NEGATIVE NEGATIVE Final    Comment:        The GeneXpert MRSA Assay (FDA approved for NASAL specimens only), is one component of a comprehensive MRSA colonization surveillance program. It is not intended to diagnose MRSA infection nor to  guide or monitor treatment for MRSA infections.   Wound culture     Status: None (Preliminary result)   Collection Time: 10/02/14  2:23 PM  Result Value Ref Range Status   Specimen Description  WOUND  Final   Special Requests NONE  Final   Gram Stain PENDING  Incomplete   Culture NO GROWTH 1 DAY  Final   Report Status PENDING  Incomplete    Studies/Results: Dg Chest Port 1 View  10/03/2014   CLINICAL DATA:  49 year old male status post PICC adjustment.  EXAM: PORTABLE CHEST - 1 VIEW  COMPARISON:  Chest x-ray 10/03/2014.  FINDINGS: There is a right upper extremity PICC with tip terminating in the mid superior vena cava. Lung volumes are low. Bibasilar opacities favored to reflect predominantly subsegmental atelectasis, although underlying airspace consolidation is not excluded, particularly at the right lung base. No definite pleural effusions. No pneumothorax. No evidence of pulmonary edema. Heart size is mildly enlarged, accentuated by low lung volumes. Upper mediastinal contours are within normal limits.  IMPRESSION: 1. Right upper extremity PICC with tip in the mid superior vena cava. 2. Low lung volumes with probable bibasilar subsegmental atelectasis. 3. Mild cardiomegaly.   Electronically Signed   By: Vinnie Langton M.D.   On: 10/03/2014 13:23   Dg Chest Port 1 View  10/03/2014   CLINICAL DATA:  Readjusted central catheter  EXAM: PORTABLE CHEST - 1 VIEW  COMPARISON:  Study obtained earlier in the day  FINDINGS: Central catheter tip is in the right atrium approximately 4 cm posterior to the cavoatrial junction. No pneumothorax. Lungs are clear. Heart is upper normal in size with pulmonary vascularity within normal limits. No adenopathy.  IMPRESSION: Central catheter tip now approximately 4 cm distal to the cavoatrial junction. No pneumothorax. No edema or consolidation.   Electronically Signed   By: Lowella Grip III M.D.   On: 10/03/2014 13:01   Dg Chest Port 1 View  10/03/2014    CLINICAL DATA:  Central catheter placement  EXAM: PORTABLE CHEST - 1 VIEW  COMPARISON:  None.  FINDINGS: Central catheter tip is in the right atrium, approximately the 7 cm distal to the cavoatrial junction. No pneumothorax. Lungs are clear. Heart is upper normal in size with pulmonary vascularity within normal limits. No adenopathy. No bone lesions.  IMPRESSION: Central catheter tip in right atrium. No pneumothorax. No edema or consolidation.   Electronically Signed   By: Lowella Grip III M.D.   On: 10/03/2014 12:45    Assessment/Plan: Raymond [name] is a 49 y.o. 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 He has had angiogram and vascular supply seems intact. Wound cultures are pending. ESR 71, CRP 25.1  Recommendations Could dc on IV vanco and zosyn with fu with me in 3 weeks -I  will review cxs on Tuesday and can adjust abx at that time. May be able to dc vanco if no MRSA isolated.  Thank you very much for the consult. Will follow with you.  [place], [place]   10/03/2014, 3:27 PM

## 2014-10-04 LAB — GLUCOSE, CAPILLARY
Glucose-Capillary: 100 mg/dL — ABNORMAL HIGH (ref 65–99)
Glucose-Capillary: 210 mg/dL — ABNORMAL HIGH (ref 65–99)

## 2014-10-04 LAB — BASIC METABOLIC PANEL
Anion gap: 4 — ABNORMAL LOW (ref 5–15)
BUN: 18 mg/dL (ref 6–20)
CO2: 28 mmol/L (ref 22–32)
Calcium: 8 mg/dL — ABNORMAL LOW (ref 8.9–10.3)
Chloride: 105 mmol/L (ref 101–111)
Creatinine, Ser: 1.25 mg/dL — ABNORMAL HIGH (ref 0.61–1.24)
GFR calc Af Amer: >60 mL/min (ref >60)
GFR calc non Af Amer: >60 mL/min (ref >60)
Glucose, Bld: 122 mg/dL — ABNORMAL HIGH (ref 65–99)
Potassium: 3.8 mmol/L (ref 3.5–5.1)
Sodium: 137 mmol/L (ref 135–145)

## 2014-10-04 LAB — CBC
HCT: 29 % — ABNORMAL LOW (ref 40.0–52.0)
Hemoglobin: 9.5 g/dL — ABNORMAL LOW (ref 13.0–18.0)
MCH: 26.6 pg (ref 26.0–34.0)
MCHC: 32.9 g/dL (ref 32.0–36.0)
MCV: 80.9 fL (ref 80.0–100.0)
Platelets: 349 10*3/uL (ref 150–440)
RBC: 3.58 MIL/uL — ABNORMAL LOW (ref 4.40–5.90)
RDW: 14.9 % — ABNORMAL HIGH (ref 11.5–14.5)
WBC: 12 10*3/uL — ABNORMAL HIGH (ref 3.8–10.6)

## 2014-10-04 MED ORDER — VANCOMYCIN HCL 10 G IV SOLR: 1500.0000 mg | Freq: Two times a day (BID) | INTRAVENOUS | Status: AC

## 2014-10-04 MED ORDER — PIPERACILLIN-TAZOBACTAM 4.5 G IVPB: 4.5000 g | Freq: Three times a day (TID) | INTRAVENOUS | Status: AC

## 2014-10-04 NOTE — Progress Notes (Signed)
2 Days Post-Op  Subjective: Patient seen. Doing well with no pain.  Objective: Vital signs in last 24 hours: Temp:  [98.3 F (36.8 C)-100.4 F (38 C)] 100.4 F (38 C) (09/03 0710) Pulse Rate:  [71-79] 78 (09/03 0710) Resp:  [16-20] 16 (09/03 0710) BP: (121-141)/(42-56) 141/48 mmHg (09/03 0710) SpO2:  [90 %-100 %] 100 % (09/03 0710) Last BM Date: 09/29/14  Intake/Output from previous day: 09/02 0701 - 09/03 0700 In: 2420 [P.O.:1720; IV Piggyback:700] Out: 3086 [Urine:4050] Intake/Output this shift: Total I/O In: 120 [P.O.:120] Out: 950 [Urine:950]  Bandage on the right foot is dry and intact. The ulceration on the left foot is mostly granular with only a mild amount of drainage and no significant cellulitis.  Lab Results:   Recent Labs  10/04/14 0343  WBC 12.0*  HGB 9.5*  HCT 29.0*  PLT 349   BMET  Recent Labs  10/03/14 0730 10/04/14 0343  NA  --  137  K  --  3.8  CL  --  105  CO2  --  28  GLUCOSE  --  122*  BUN  --  18  CREATININE 1.33* 1.25*  CALCIUM  --  8.0*   PT/INR No results for input(s): LABPROT, INR in the last 72 hours. ABG No results for input(s): PHART, HCO3 in the last 72 hours.  Invalid input(s): PCO2, PO2  Studies/Results: Dg Chest Port 1 View  10/03/2014   CLINICAL DATA:  49 year old male status post PICC adjustment.  EXAM: PORTABLE CHEST - 1 VIEW  COMPARISON:  Chest x-ray 10/03/2014.  FINDINGS: There is a right upper extremity PICC with tip terminating in the mid superior vena cava. Lung volumes are low. Bibasilar opacities favored to reflect predominantly subsegmental atelectasis, although underlying airspace consolidation is not excluded, particularly at the right lung base. No definite pleural effusions. No pneumothorax. No evidence of pulmonary edema. Heart size is mildly enlarged, accentuated by low lung volumes. Upper mediastinal contours are within normal limits.  IMPRESSION: 1. Right upper extremity PICC with tip in the mid superior  vena cava. 2. Low lung volumes with probable bibasilar subsegmental atelectasis. 3. Mild cardiomegaly.   Electronically Signed   By: Vinnie Langton M.D.   On: 10/03/2014 13:23   Dg Chest Port 1 View  10/03/2014   CLINICAL DATA:  Readjusted central catheter  EXAM: PORTABLE CHEST - 1 VIEW  COMPARISON:  Study obtained earlier in the day  FINDINGS: Central catheter tip is in the right atrium approximately 4 cm posterior to the cavoatrial junction. No pneumothorax. Lungs are clear. Heart is upper normal in size with pulmonary vascularity within normal limits. No adenopathy.  IMPRESSION: Central catheter tip now approximately 4 cm distal to the cavoatrial junction. No pneumothorax. No edema or consolidation.   Electronically Signed   By: Lowella Grip III M.D.   On: 10/03/2014 13:01   Dg Chest Port 1 View  10/03/2014   CLINICAL DATA:  Central catheter placement  EXAM: PORTABLE CHEST - 1 VIEW  COMPARISON:  None.  FINDINGS: Central catheter tip is in the right atrium, approximately the 7 cm distal to the cavoatrial junction. No pneumothorax. Lungs are clear. Heart is upper normal in size with pulmonary vascularity within normal limits. No adenopathy. No bone lesions.  IMPRESSION: Central catheter tip in right atrium. No pneumothorax. No edema or consolidation.   Electronically Signed   By: Lowella Grip III M.D.   On: 10/03/2014 12:45    Anti-infectives: Anti-infectives    Start  Dose/Rate Route Frequency Ordered Stop   10/04/14 0000  vancomycin 1,500 mg in sodium chloride 0.9 % 500 mL     1,500 mg 250 mL/hr over 120 Minutes Intravenous Every 12 hours 10/04/14 0917 11/13/14 2359   10/04/14 0000  piperacillin-tazobactam (ZOSYN) 4-0.5 GM/100ML SOLN IVPB     4.5 g 25 mL/hr over 240 Minutes Intravenous Every 8 hours 10/04/14 0917 11/13/14 2359   10/03/14 2008  vancomycin (VANCOCIN) 1,500 mg in sodium chloride 0.9 % 500 mL IVPB     1,500 mg 250 mL/hr over 120 Minutes Intravenous Every 12 hours  10/03/14 1039     10/02/14 1115  ceFAZolin (ANCEF) IVPB 1 g/50 mL premix     1 g 100 mL/hr over 30 Minutes Intravenous  Once 10/02/14 1112 10/02/14 1143   09/30/14 2200  piperacillin-tazobactam (ZOSYN) IVPB 4.5 g     4.5 g 25 mL/hr over 240 Minutes Intravenous 3 times per day 09/30/14 1752     09/30/14 2100  vancomycin (VANCOCIN) 1,500 mg in sodium chloride 0.9 % 500 mL IVPB  Status:  Discontinued     1,500 mg 250 mL/hr over 120 Minutes Intravenous Every 8 hours 09/30/14 1752 10/03/14 1039   09/30/14 1415  vancomycin (VANCOCIN) IVPB 1000 mg/200 mL premix     1,000 mg 200 mL/hr over 60 Minutes Intravenous  Once 09/30/14 1401 09/30/14 1614   09/30/14 1400  piperacillin-tazobactam (ZOSYN) IVPB 3.375 g     3.375 g 12.5 mL/hr over 240 Minutes Intravenous  Once 09/30/14 1354 09/30/14 2010      Assessment/Plan: s/p Procedure(s): AMPUTATION RAY (Right) Wet-to-dry dressing was reapplied to the left foot. Surgical bandage on the right left intact. [facility] Continue with daily wet-to-dry dressings on the left foot. We will leave the surgical foot intact. Follow-up next week with Dr. Elvina Mattes.  LOS: 4 days    Aslin Farinas W. 10/04/2014

## 2014-10-04 NOTE — Progress Notes (Signed)
Called report to [place] at [facility]. Marcene Brawn prepared patient for transport. EMS called. VSS at time of discharge.
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
## 2014-10-04 NOTE — Discharge Instructions (Signed)
weightbearing limited only to the bathroom usage was nonweightbearing on surgical leg until followed by Dr. Elvina Mattes in the office  Recommend wedge shoes on the right foot and Regular postoperative shows on the left foot.  Bone and Joint Infections Joint infections are called septic or infectious arthritis. An infected joint may damage cartilage and tissue very quickly. This may destroy the joint. Bone infections (osteomyelitis) may last for years. Joints may become stiff if left untreated. Bacteria are the most common cause. Other causes include viruses and fungi, but these are more rare. Bone and joint infections usually come from injury or infection elsewhere in your body; the germs are carried to your bones or joints through the bloodstream.  CAUSES   Blood-carried germs from an infection elsewhere in your body can eventually spread to a bone or joint. The germ staphylococcus is the most common cause of both osteomyelitis and septic arthritis.  An injury can introduce germs into your bones or joints. SYMPTOMS   Weight loss.  Tiredness.  Chills and fever.  Bone or joint pain at rest and with activity.  Tenderness when touching the area or bending the joint.  Refusal to bear weight on a leg or inability to use an arm due to pain.  Decreased range of motion in a joint.  Skin redness, warmth, and tenderness.  Open skin sores and drainage. RISK FACTORS Children, the elderly, and those with weak immune systems are at increased risk of bone and joint infections. It is more common in people with HIV infections and with people on chemotherapy. People are also at increased risk if they have surgery where metal implants are used to stabilize the bone. Plates, screws, or artificial joints provide a surface that bacteria can stick on. Such a growth of bacteria is called biofilm. The biofilm protects bacteria from antibiotics and bodily defenses. This allows germs to multiply. Other reasons for  increased risks include:   Having previous surgery or injury of a bone or joint.  Being on high-dose corticosteroids and immunosuppressive medications that weaken your body's resistance to germs.  Diabetes and long-standing diseases.  Use of intravenous street drugs.  Being on hemodialysis.  Having a history of urinary tract infections.  Removal of your spleen (splenectomy). This weakens your immunity.  Chronic viral infections such as HIV or AIDS.  Lack of sensation such as paraplegia, quadriplegia, or spina bifida. DIAGNOSIS   Increased numbers of white blood cells in your blood may indicate infection. Some times your caregivers are able to identify the infecting germs by testing your blood. Inflammatory markers present in your bloodstream such as an erythrocyte sedimentation rate (ESR or sed rate) or c-reactive protein (CRP) can be indicators of deep infection.  Bone scans and X-ray exams are necessary for diagnosing osteomyelitis. They may help your caregiver find the infected areas. Other studies may give more detailed information. They may help detect fluid collections around a joint, abnormal bone surfaces, or be useful in diagnosing septic arthritis. They can find soft tissue swelling and find excess fluid in an infected joint or the adjacent bone. These tests include:  Ultrasound.  CT (computerized tomography).  MRI (magnetic resonance imaging).  The best test for diagnosing a bone or joint infection is an aspiration or biopsy. Your caregiver will usually use a local anesthetic. He or she can then remove tissue from a bone injury or use a needle to take fluids from an infected joint. A local anesthetic medication numbs the area to be biopsied.  Often biopsies are done in the operating room under general anesthesia. This means you will be asleep during the procedure. Tests performed on these samples can identify an infection. TREATMENT   Treatment can help control  long-standing infections, but infections may come back.  Infections can infect any bone or joint at any age.  Bone and joint infections are rarely fatal.  Bone infection left untreated can become a never-ending infection. It can spread to other areas of your body. It may eventually cause bone death. Reduced limb or joint function can result. In severe cases, this may require removal of a limb. Spinal osteomyelitis is very dangerous. Untreated, it may damage spinal nerves and cause death.  The most common complication of septic arthritis is osteoarthritis with pain and decreased range of motion of the joint. Some forms of treatment may include:  If the infection is caused by bacteria, it is generally treated with antibiotics. You will likely receive the drugs through a vein (intravenously) for anywhere from 2 to 6 weeks. In some cases, especially with children, oral antibiotics following an initial intravenous dose may be effective. The treatment you receive depends on the:  Type of bacteria.  Location of the infection.  Type of surgery that might be done.  Other health conditions or issues you might have.  Your caregiver may drain soft tissue abscesses or pockets of fluid around infected bones or joints. If you have septic arthritis, your caregiver may use a needle to drain pus from the joint on a daily basis. He or she may use an arthroscope to clean the joint or may need to open the joint surgically to remove damaged tissue and infection. An arthroscope is an instrument like a thin lighted telescope. It can be used to look inside the joint.  Surgery is usually needed if the infection has become long-standing. It may also be needed if there is hardware (such as metal plates, screws, or artificial joints) inside the patient. Sometimes a bone or muscle graft is needed to fill in the open space. This promotes growth of new tissues and better blood flow to the area. PREVENTION   Clean and  disinfect wounds quickly to help prevent the start of a bone or joint infection. Get treatment for any infections to prevent spread to a bone or joint.  Do not smoke. Smoking decreases healing rates of bone and predisposes to infection.  When given medications that suppress your immune system, use them according to your caregiver's instructions. Do not take more than prescribed for your condition.  Take good care of your feet and skin, especially if you have diabetes, decreased sensation or circulation problems. SEEK IMMEDIATE MEDICAL CARE IF:   You cannot bear weight on a leg or use an arm, especially following a minor injury. This can be a sign of bone or joint infection.  You think you may have signs or symptoms of a bone or joint infection. Your chance of getting rid of an infection is better if treated early. Document Released: 01/17/2005 Document Revised: 04/11/2011 Document Reviewed: 12/17/2008 [facility] Patient Information 2015 [place], [place]. This information is not intended to replace advice given to you by your health care provider. Make sure you discuss any questions you have with your health care provider.

## 2014-10-04 NOTE — Discharge Summary (Signed)
[facility] NAME: Raymond [name]    MR#:  [id number]  DATE OF BIRTH:  July 31, 1965  DATE OF ADMISSION:  09/30/2014 ADMITTING PHYSICIAN: Henreitta Leber, MD  DATE OF DISCHARGE: No discharge date for patient encounter.  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 Diabetes   . Hypertension   . Morbid obesity   . Systolic heart failure     Preserved EF 50-55%  . Chronic respiratory failure with hypoxia     2 L Siren continuously  . OSA on CPAP   . Arthritis     hands  . Cellulitis   . PVD (peripheral vascular disease)   . Hepatic steatosis    HOSPITAL COURSE:  49 y.o. male with a known history of diabetes, hypertension, obstructive sleep apnea, history of CHF, morbid obesity, peripheral vascular disease, who was admitted to the hospital due to right foot swelling, pain and also an ulcer on the right previously amputated area with foul-smelling discharge.  He was found to have ABSCESS and osteomyelitis of right foot secondary to nonhealing diabetic foot ulcer.  Please see Dr. Edward Jolly dictated history and physical for further details.  Podiatry consultation was obtained with Dr. Elvina Mattes who recommended amputation right foot including removal of the residual proximal phalanx metatarsal head and both sesamoids for Osteomyelitis to proximal phalanx base (residual) and sesamoids right first metatarsal phalangeal joint secondary to diabetic ulceration.  On first of September.  Patient was also evaluated by vascular surgery, Dr. Lucky Cowboy who recommended and performed   1. Ultrasound guidance for  vascular access left femoral artery 2. Catheter placement into mid right SFA from left femoral approach 3. Aortogram and selective bilateral lower extremity angiograms 4. StarClose closure device left femoral artery  On first of September for PAD with ulceration BLE and osteomyelitis in right foot.  Infectious disease consultation was obtained with Dr. Ola Spurr who recommended long-term IV antibiotics for at least 4-6 weeks through PICC line considering osteomyelitis. PICC line was obtained and physical therapy recommended REHABILITATION WHERE HE IS being discharged in stable condition for long-term IV antibiotics.  He is agreeable with the discharge plan and is feeling much better. He was recommended weightbearing limited only to the bathroom usage was nonweightbearing on surgical leg until followed by Dr. Elvina Mattes in the office. Recommend wedge shoes on the right foot and Regular postoperative shows on the left foot. daily dry dressing changes - follow-up in 10-14 days at podiatry office. DISCHARGE CONDITIONS:  Stable CONSULTS OBTAINED:  Treatment Team:  Albertine Patricia, DPM Algernon Huxley, MD Adrian Prows, MD DRUG ALLERGIES:  No Known Allergies DISCHARGE MEDICATIONS:   Current Discharge Medication List    START taking these medications   Details  piperacillin-tazobactam (ZOSYN) 4-0.5 GM/100ML SOLN IVPB Inject 100 mLs (4.5 g total) into the vein every 8 (eight) hours. Qty: 3000 mL, Refills: 0    vancomycin 1,500 mg in sodium chloride 0.9 % 500 mL Inject 1,500 mg into the vein every 12 (twelve) hours. Qty: 30 Dose, Refills: 0      CONTINUE these medications which have NOT CHANGED   Details  aspirin EC 81 MG tablet Take  81 mg by mouth daily.    carvedilol (COREG) 12.5 MG tablet Take 12.5 mg by mouth 2 (two) times daily.     enalapril (VASOTEC) 20 MG tablet Take 40 mg by mouth 2 (two) times daily.     Fluticasone-Salmeterol (ADVAIR  DISKUS) 500-50 MCG/DOSE AEPB Inhale 1 puff into the lungs 2 (two) times daily. Rinse and gargle after each use. Qty: 60 each, Refills: 2    furosemide (LASIX) 40 MG tablet Take 1 tablet (40 mg total) by mouth 2 (two) times daily as needed. Qty: 60 tablet, Refills: 11    gabapentin (NEURONTIN) 300 MG capsule Take 300 mg by mouth 3 (three) times daily.    insulin detemir (LEVEMIR) 100 UNIT/ML injection Inject 75 Units into the skin 2 (two) times daily.     insulin lispro (HUMALOG) 100 UNIT/ML cartridge Inject 60 Units into the skin 3 (three) times daily with meals.    metFORMIN (GLUCOPHAGE) 1000 MG tablet Take 1,000 mg by mouth 2 (two) times daily with a meal.    pantoprazole (PROTONIX) 40 MG tablet Take 40 mg by mouth daily.    potassium chloride (MICRO-K) 10 MEQ CR capsule Take 10 mEq by mouth 2 (two) times daily.       DISCHARGE INSTRUCTIONS:   DIET:  Regular diet  DISCHARGE CONDITION:  Good  ACTIVITY:  Activity as tolerated  OXYGEN:  Home Oxygen: No.   Oxygen Delivery: room air  DISCHARGE LOCATION:  nursing home   If you experience worsening of your admission symptoms, develop shortness of breath, life threatening emergency, suicidal or homicidal thoughts you must seek medical attention immediately by calling 911 or calling your MD immediately  if symptoms less severe.  You Must read complete instructions/literature along with all the possible adverse reactions/side effects for all the Medicines you take and that have been prescribed to you. Take any new Medicines after you have completely understood and accpet all the possible adverse reactions/side effects.   Please note  You were cared for by a hospitalist during your hospital stay. If you have any questions about your discharge medications or the care you received while you were in the hospital after you are discharged, you can call the unit and asked to speak with the hospitalist on call if the hospitalist that  took care of you is not available. Once you are discharged, your primary care physician will handle any further medical issues. Please note that NO REFILLS for any discharge medications will be authorized once you are discharged, as it is imperative that you return to your primary care physician (or establish a relationship with a primary care physician if you do not have one) for your aftercare needs so that they can reassess your need for medications and monitor your lab values.    On the day of Discharge:   VITAL SIGNS:  Blood pressure 141/48, pulse 78, temperature 100.4 F (38 C), temperature source Oral, resp. rate 16, height 6\' 2"  (1.88 m), weight 167.831 kg (370 lb), SpO2 100 %.  I/O:   Intake/Output Summary (Last 24 hours) at 10/04/14 0919 Last data filed at 10/04/14 0200  Gross per 24 hour  Intake   2180 ml  Output   3150 ml  Net   -970 ml    PHYSICAL EXAMINATION:  GENERAL:  49 y.o.-year-old patient lying in the bed with no acute distress.  EYES: Pupils equal, round, reactive to light and accommodation. No scleral icterus. Extraocular muscles intact.  HEENT: Head atraumatic,  normocephalic. Oropharynx and nasopharynx clear.  NECK:  Supple, no jugular venous distention. No thyroid enlargement, no tenderness.  LUNGS: Normal breath sounds bilaterally, no wheezing, rales,rhonchi or crepitation. No use of accessory muscles of respiration.  CARDIOVASCULAR: S1, S2 normal. No murmurs, rubs, or gallops.  ABDOMEN: Soft, non-tender, non-distended. Bowel sounds present. No organomegaly or mass.  EXTREMITIES: No pedal edema, cyanosis, or clubbing.  NEUROLOGIC: Cranial nerves II through XII are intact. Muscle strength 5/5 in all extremities. Sensation intact. Gait not checked.  PSYCHIATRIC: The patient is alert and oriented x 3.  SKIN: No obvious rash, lesion, or ulcer.   DATA REVIEW:   CBC  Recent Labs Lab 10/04/14 0343  WBC 12.0*  HGB 9.5*  HCT 29.0*  PLT 349    Chemistries    Recent Labs Lab 10/04/14 0343  NA 137  K 3.8  CL 105  CO2 28  GLUCOSE 122*  BUN 18  CREATININE 1.25*  CALCIUM 8.0*    Cardiac Enzymes No results for input(s): TROPONINI in the last 168 hours.  Microbiology Results  Results for orders placed or performed during the hospital encounter of 09/30/14  Culture, blood (routine x 2)     Status: None (Preliminary result)   Collection Time: 09/30/14 12:45 PM  Result Value Ref Range Status   Specimen Description BLOOD  Final   Special Requests   Final    BOTTLES DRAWN AEROBIC AND ANAEROBIC 1CC AEROBIC,1CC ANAEROBIC   Culture NO GROWTH 4 DAYS  Final   Report Status PENDING  Incomplete  Blood culture (routine x 2)     Status: None (Preliminary result)   Collection Time: 09/30/14  2:05 PM  Result Value Ref Range Status   Specimen Description BLOOD RIGHT ASSIST CONTROL  Final   Special Requests   Final    BOTTLES DRAWN AEROBIC AND ANAEROBIC  5 CC ANAERO 3 CC AERO   Culture NO GROWTH 4 DAYS  Final   Report Status PENDING  Incomplete  Blood culture (routine x 2)     Status: None (Preliminary result)   Collection Time: 09/30/14  5:23 PM  Result Value Ref Range Status   Specimen Description BLOOD LEFT ARM  Final   Special Requests BOTTLES DRAWN AEROBIC AND ANAEROBIC 10CC  Final   Culture NO GROWTH 4 DAYS  Final   Report Status PENDING  Incomplete  MRSA PCR Screening     Status: None   Collection Time: 10/02/14  5:50 AM  Result Value Ref Range Status   MRSA by PCR NEGATIVE NEGATIVE Final    Comment:        The GeneXpert MRSA Assay (FDA approved for NASAL specimens only), is one component of a comprehensive MRSA colonization surveillance program. It is not intended to diagnose MRSA infection nor to guide or monitor treatment for MRSA infections.   Wound culture     Status: None (Preliminary result)   Collection Time: 10/02/14  2:23 PM  Result Value Ref Range Status   Specimen Description WOUND  Final   Special Requests NONE   Final   Gram Stain PENDING  Incomplete   Culture NO GROWTH 1 DAY  Final   Report Status PENDING  Incomplete    RADIOLOGY:  Dg Chest Port 1 View  10/03/2014   CLINICAL DATA:  49 year old male status post PICC adjustment.  EXAM: PORTABLE CHEST - 1 VIEW  COMPARISON:  Chest x-ray 10/03/2014.  FINDINGS: There is a right upper extremity PICC with tip terminating in the mid  superior vena cava. Lung volumes are low. Bibasilar opacities favored to reflect predominantly subsegmental atelectasis, although underlying airspace consolidation is not excluded, particularly at the right lung base. No definite pleural effusions. No pneumothorax. No evidence of pulmonary edema. Heart size is mildly enlarged, accentuated by low lung volumes. Upper mediastinal contours are within normal limits.  IMPRESSION: 1. Right upper extremity PICC with tip in the mid superior vena cava. 2. Low lung volumes with probable bibasilar subsegmental atelectasis. 3. Mild cardiomegaly.   Electronically Signed   By: Vinnie Langton M.D.   On: 10/03/2014 13:23   Dg Chest Port 1 View  10/03/2014   CLINICAL DATA:  Readjusted central catheter  EXAM: PORTABLE CHEST - 1 VIEW  COMPARISON:  Study obtained earlier in the day  FINDINGS: Central catheter tip is in the right atrium approximately 4 cm posterior to the cavoatrial junction. No pneumothorax. Lungs are clear. Heart is upper normal in size with pulmonary vascularity within normal limits. No adenopathy.  IMPRESSION: Central catheter tip now approximately 4 cm distal to the cavoatrial junction. No pneumothorax. No edema or consolidation.   Electronically Signed   By: Lowella Grip III M.D.   On: 10/03/2014 13:01   Dg Chest Port 1 View  10/03/2014   CLINICAL DATA:  Central catheter placement  EXAM: PORTABLE CHEST - 1 VIEW  COMPARISON:  None.  FINDINGS: Central catheter tip is in the right atrium, approximately the 7 cm distal to the cavoatrial junction. No pneumothorax. Lungs are clear. Heart is  upper normal in size with pulmonary vascularity within normal limits. No adenopathy. No bone lesions.  IMPRESSION: Central catheter tip in right atrium. No pneumothorax. No edema or consolidation.   Electronically Signed   By: Lowella Grip III M.D.   On: 10/03/2014 12:45     Management plans discussed with the patient, family and they are in agreement.  CODE STATUS:     Code Status Orders        Start     Ordered   09/30/14 1723  Full code   Continuous     09/30/14 1722    Advance Directive Documentation        Most Recent Value   Type of Advance Directive  Living will, Healthcare Power of Attorney   Pre-existing out of facility DNR order (yellow form or pink MOST form)     "MOST" Form in Place?        TOTAL TIME TAKING CARE OF THIS PATIENT: 55 minutes.    [facility], Irelynn Schermerhorn M.D on 10/04/2014 at 9:19 AM  Between 7am to 6pm - Pager - [phone]  After 6pm go to [email or web address] - password [email or web address] [place] [facility]  Office  [phone]  CC: Primary care physician; Tomasita Morrow, MD Adrian Prows, MD Albertine Patricia, DPM Algernon Huxley, MD

## 2014-10-04 NOTE — Progress Notes (Signed)
Made attempt to contact [facility] to give report on Raymond [name]. Phone rang for 5 min before it disconnected.  Waited 5 min and called again & put on hold without them answering the phone. Waited on hold for 10 min.

## 2014-10-04 NOTE — Clinical Social Work Placement (Signed)
   CLINICAL SOCIAL WORK PLACEMENT  NOTE  Date:  10/04/2014  Patient Details  Name: Raymond [name] [facility] MRN: [id number] Date of Birth: 1965/07/07  Clinical Social Work is seeking post-discharge placement for this patient at the [place] level of care (*CSW will initial, date and re-position this form in  chart as items are completed):  Yes   Patient/family provided with [place] Work Department's list of facilities offering this level of care within the geographic area requested by the patient (or if unable, by the patient's family).  Yes   Patient/family informed of their freedom to choose among providers that offer the needed level of care, that participate in Medicare, Medicaid or managed care program needed by the patient, have an available bed and are willing to accept the patient.  Yes   Patient/family informed of [place]'s ownership interest in [facility] and [facility], as well as of the fact that they are under no obligation to receive care at these facilities.  PASRR submitted to EDS on 10/01/14     PASRR number received on 10/01/14     Existing PASRR number confirmed on       FL2 transmitted to all facilities in geographic area requested by pt/family on 10/01/14     FL2 transmitted to all facilities within larger geographic area on       Patient informed that his/her managed care company has contracts with or will negotiate with certain facilities, including the following:        Yes   Patient/family informed of bed offers received.  Patient chooses bed at  [facility] [place] )     Physician recommends and patient chooses bed at      Patient to be transferred to  [facility] ) on 10/04/14.  Patient to be transferred to facility by  [facility] )     Patient family notified on 10/04/14 of transfer.  Name of family member notified:   (Patient reported that he would notify his  family of D/C today. )     PHYSICIAN       Additional Comment:    _______________________________________________ Loralyn Freshwater, LCSW 10/04/2014, 10:09 AM

## 2014-10-05 LAB — CULTURE, BLOOD (ROUTINE X 2)
Culture: NO GROWTH
Culture: NO GROWTH
Culture: NO GROWTH

## 2014-10-05 LAB — WOUND CULTURE
Culture: NO GROWTH
Gram Stain: NONE SEEN

## 2014-10-07 ENCOUNTER — Other Ambulatory Visit
Admission: RE | Admit: 2014-10-07 | Discharge: 2014-10-07 | Disposition: A | Discharge status: Home / Self Care | Payer: Medicare PPO | Source: Skilled Nursing Facility | Attending: Internal Medicine | Admitting: Internal Medicine

## 2014-10-07 DIAGNOSIS — B350 Tinea barbae and tinea capitis: Secondary | ICD-10-CM | POA: Diagnosis present

## 2014-10-07 LAB — CBC WITH DIFFERENTIAL/PLATELET
Basophils Absolute: 0.1 10*3/uL (ref 0–0.1)
Basophils Relative: 1 %
Eosinophils Absolute: 0.7 10*3/uL (ref 0–0.7)
Eosinophils Relative: 4 %
HCT: 28.3 % — ABNORMAL LOW (ref 40.0–52.0)
Hemoglobin: 9.1 g/dL — ABNORMAL LOW (ref 13.0–18.0)
Lymphocytes Relative: 12 %
Lymphs Abs: 2.2 10*3/uL (ref 1.0–3.6)
MCH: 26.1 pg (ref 26.0–34.0)
MCHC: 32 g/dL (ref 32.0–36.0)
MCV: 81.4 fL (ref 80.0–100.0)
Monocytes Absolute: 1.2 10*3/uL — ABNORMAL HIGH (ref 0.2–1.0)
Monocytes Relative: 6 %
Neutro Abs: 14.5 10*3/uL — ABNORMAL HIGH (ref 1.4–6.5)
Neutrophils Relative %: 77 %
Platelets: 470 10*3/uL — ABNORMAL HIGH (ref 150–440)
RBC: 3.48 MIL/uL — ABNORMAL LOW (ref 4.40–5.90)
RDW: 15.5 % — ABNORMAL HIGH (ref 11.5–14.5)
WBC: 18.7 10*3/uL — ABNORMAL HIGH (ref 3.8–10.6)

## 2014-10-07 LAB — COMPREHENSIVE METABOLIC PANEL
ALT: 28 U/L (ref 17–63)
AST: 27 U/L (ref 15–41)
Albumin: 2.4 g/dL — ABNORMAL LOW (ref 3.5–5.0)
Alkaline Phosphatase: 76 U/L (ref 38–126)
Anion gap: 8 (ref 5–15)
BUN: 14 mg/dL (ref 6–20)
CO2: 25 mmol/L (ref 22–32)
Calcium: 8 mg/dL — ABNORMAL LOW (ref 8.9–10.3)
Chloride: 106 mmol/L (ref 101–111)
Creatinine, Ser: 1.11 mg/dL (ref 0.61–1.24)
GFR calc Af Amer: >60 mL/min (ref >60)
GFR calc non Af Amer: >60 mL/min (ref >60)
Glucose, Bld: 113 mg/dL — ABNORMAL HIGH (ref 65–99)
Potassium: 3.9 mmol/L (ref 3.5–5.1)
Sodium: 139 mmol/L (ref 135–145)
Total Bilirubin: 0.5 mg/dL (ref 0.3–1.2)
Total Protein: 6.5 g/dL (ref 6.5–8.1)

## 2014-10-07 LAB — SURGICAL PATHOLOGY

## 2014-10-07 LAB — VANCOMYCIN, TROUGH: Vancomycin Tr: 10 ug/mL (ref 10–20)

## 2014-10-14 ENCOUNTER — Other Ambulatory Visit
Admission: RE | Admit: 2014-10-14 | Discharge: 2014-10-14 | Disposition: A | Discharge status: Home / Self Care | Payer: Medicare PPO | Source: Other Acute Inpatient Hospital | Attending: Podiatry | Admitting: Podiatry

## 2014-10-14 DIAGNOSIS — L03115 Cellulitis of right lower limb: Secondary | ICD-10-CM | POA: Insufficient documentation

## 2014-10-17 LAB — WOUND CULTURE: Culture: NO GROWTH

## 2014-11-04 ENCOUNTER — Other Ambulatory Visit
Admission: RE | Admit: 2014-11-04 | Discharge: 2014-11-04 | Disposition: A | Discharge status: Home / Self Care | Payer: Medicare PPO | Source: Skilled Nursing Facility | Attending: Internal Medicine | Admitting: Internal Medicine

## 2014-11-04 DIAGNOSIS — L97509 Non-pressure chronic ulcer of other part of unspecified foot with unspecified severity: Secondary | ICD-10-CM | POA: Insufficient documentation

## 2014-11-04 DIAGNOSIS — J9611 Chronic respiratory failure with hypoxia: Secondary | ICD-10-CM | POA: Insufficient documentation

## 2014-11-04 DIAGNOSIS — R262 Difficulty in walking, not elsewhere classified: Secondary | ICD-10-CM | POA: Insufficient documentation

## 2014-11-04 DIAGNOSIS — I10 Essential (primary) hypertension: Secondary | ICD-10-CM | POA: Diagnosis not present

## 2014-11-04 DIAGNOSIS — L03119 Cellulitis of unspecified part of limb: Secondary | ICD-10-CM | POA: Insufficient documentation

## 2014-11-04 DIAGNOSIS — M069 Rheumatoid arthritis, unspecified: Secondary | ICD-10-CM | POA: Insufficient documentation

## 2014-11-04 DIAGNOSIS — K219 Gastro-esophageal reflux disease without esophagitis: Secondary | ICD-10-CM | POA: Insufficient documentation

## 2014-11-04 DIAGNOSIS — I739 Peripheral vascular disease, unspecified: Secondary | ICD-10-CM | POA: Insufficient documentation

## 2014-11-04 DIAGNOSIS — Z4781 Encounter for orthopedic aftercare following surgical amputation: Secondary | ICD-10-CM | POA: Insufficient documentation

## 2014-11-04 DIAGNOSIS — E0840 Diabetes mellitus due to underlying condition with diabetic neuropathy, unspecified: Secondary | ICD-10-CM | POA: Insufficient documentation

## 2014-11-04 DIAGNOSIS — E13621 Other specified diabetes mellitus with foot ulcer: Secondary | ICD-10-CM | POA: Diagnosis not present

## 2014-11-04 DIAGNOSIS — E669 Obesity, unspecified: Secondary | ICD-10-CM | POA: Insufficient documentation

## 2014-11-04 DIAGNOSIS — Z8631 Personal history of diabetic foot ulcer: Secondary | ICD-10-CM | POA: Diagnosis not present

## 2014-11-04 DIAGNOSIS — E119 Type 2 diabetes mellitus without complications: Secondary | ICD-10-CM | POA: Insufficient documentation

## 2014-11-05 LAB — VANCOMYCIN, TROUGH: Vancomycin Tr: 12 ug/mL (ref 10–20)

## 2014-12-01 ENCOUNTER — Ambulatory Visit: Payer: Medicare PPO | Admitting: Internal Medicine

## 2014-12-01 ENCOUNTER — Encounter: Payer: Self-pay | Admitting: *Deleted

## 2015-02-19 ENCOUNTER — Ambulatory Visit: Payer: Medicare PPO | Admitting: Cardiovascular Disease

## 2015-02-19 ENCOUNTER — Encounter: Payer: Self-pay | Admitting: *Deleted

## 2015-03-06 ENCOUNTER — Telehealth: Payer: Self-pay | Admitting: Cardiovascular Disease

## 2015-03-06 NOTE — Telephone Encounter (Signed)
lmov to schedule fu appt. 3 rd attempt to schedule from recall list. Deleting recall.

## 2015-09-03 ENCOUNTER — Encounter: Payer: Self-pay | Admitting: *Deleted

## 2015-09-10 ENCOUNTER — Ambulatory Visit: Payer: Medicare PPO | Admitting: Anesthesiology

## 2015-09-10 ENCOUNTER — Ambulatory Visit
Admission: RE | Admit: 2015-09-10 | Discharge: 2015-09-10 | Disposition: A | Discharge status: Home / Self Care | Payer: Medicare PPO | Source: Ambulatory Visit | Attending: Ophthalmology | Admitting: Ophthalmology

## 2015-09-10 ENCOUNTER — Encounter: Payer: Self-pay | Admitting: *Deleted

## 2015-09-10 ENCOUNTER — Encounter
Admission: RE | Disposition: A | Discharge status: Home / Self Care | Payer: Self-pay | Source: Ambulatory Visit | Attending: Ophthalmology

## 2015-09-10 DIAGNOSIS — R0602 Shortness of breath: Secondary | ICD-10-CM | POA: Diagnosis not present

## 2015-09-10 DIAGNOSIS — I110 Hypertensive heart disease with heart failure: Secondary | ICD-10-CM | POA: Diagnosis not present

## 2015-09-10 DIAGNOSIS — J449 Chronic obstructive pulmonary disease, unspecified: Secondary | ICD-10-CM | POA: Insufficient documentation

## 2015-09-10 DIAGNOSIS — E119 Type 2 diabetes mellitus without complications: Secondary | ICD-10-CM | POA: Insufficient documentation

## 2015-09-10 DIAGNOSIS — G4730 Sleep apnea, unspecified: Secondary | ICD-10-CM | POA: Diagnosis not present

## 2015-09-10 DIAGNOSIS — I739 Peripheral vascular disease, unspecified: Secondary | ICD-10-CM | POA: Diagnosis not present

## 2015-09-10 DIAGNOSIS — H2511 Age-related nuclear cataract, right eye: Secondary | ICD-10-CM | POA: Diagnosis not present

## 2015-09-10 DIAGNOSIS — K219 Gastro-esophageal reflux disease without esophagitis: Secondary | ICD-10-CM | POA: Diagnosis not present

## 2015-09-10 DIAGNOSIS — I509 Heart failure, unspecified: Secondary | ICD-10-CM | POA: Insufficient documentation

## 2015-09-10 HISTORY — DX: Gastro-esophageal reflux disease without esophagitis: K21.9

## 2015-09-10 HISTORY — PX: CATARACT EXTRACTION W/PHACO: SHX586

## 2015-09-10 HISTORY — DX: Heart failure, unspecified: I50.9

## 2015-09-10 HISTORY — DX: Unspecified hearing loss, unspecified ear: H91.90

## 2015-09-10 LAB — GLUCOSE, CAPILLARY: Glucose-Capillary: 248 mg/dL — ABNORMAL HIGH (ref 65–99)

## 2015-09-10 SURGERY — PHACOEMULSIFICATION, CATARACT, WITH IOL INSERTION
Anesthesia: Monitor Anesthesia Care | Site: Eye | Laterality: Right | Wound class: Clean

## 2015-09-10 MED ORDER — ARMC OPHTHALMIC DILATING GEL
OPHTHALMIC | Status: AC
  Administered 2015-09-10: 1 via OPHTHALMIC
  Filled 2015-09-10: qty 0.25

## 2015-09-10 MED ORDER — CEFUROXIME OPHTHALMIC INJECTION 1 MG/0.1 ML
INJECTION | OPHTHALMIC | Status: AC
  Filled 2015-09-10: qty 0.1

## 2015-09-10 MED ORDER — NA CHONDROIT SULF-NA HYALURON 40-17 MG/ML IO SOLN
INTRAOCULAR | Status: DC | PRN
  Administered 2015-09-10: 1 mL via INTRAOCULAR

## 2015-09-10 MED ORDER — POVIDONE-IODINE 5 % OP SOLN
OPHTHALMIC | Status: AC
  Administered 2015-09-10: 1 via OPHTHALMIC
  Filled 2015-09-10: qty 30

## 2015-09-10 MED ORDER — MOXIFLOXACIN HCL 0.5 % OP SOLN: 1.0000 [drp] | OPHTHALMIC | Status: DC | PRN

## 2015-09-10 MED ORDER — ARMC OPHTHALMIC DILATING GEL
1.0000 | OPHTHALMIC | Status: DC | PRN
Start: 2015-09-10 — End: 2015-09-10
  Administered 2015-09-10 (×2): 1 via OPHTHALMIC

## 2015-09-10 MED ORDER — MOXIFLOXACIN HCL 0.5 % OP SOLN
OPHTHALMIC | Status: DC | PRN
  Administered 2015-09-10: 1 [drp] via OPHTHALMIC

## 2015-09-10 MED ORDER — CARBACHOL 0.01 % IO SOLN
INTRAOCULAR | Status: DC | PRN
  Administered 2015-09-10: .5 mL via INTRAOCULAR

## 2015-09-10 MED ORDER — EPINEPHRINE HCL 1 MG/ML IJ SOLN
INTRAMUSCULAR | Status: AC
  Filled 2015-09-10: qty 2

## 2015-09-10 MED ORDER — NA CHONDROIT SULF-NA HYALURON 40-17 MG/ML IO SOLN
INTRAOCULAR | Status: AC
  Filled 2015-09-10: qty 1

## 2015-09-10 MED ORDER — LIDOCAINE HCL (PF) 4 % IJ SOLN
INTRAMUSCULAR | Status: DC | PRN
  Administered 2015-09-10: .5 mL via OPHTHALMIC

## 2015-09-10 MED ORDER — SODIUM CHLORIDE 0.9 % IV SOLN
INTRAVENOUS | Status: DC
  Administered 2015-09-10: 10:00:00 via INTRAVENOUS

## 2015-09-10 MED ORDER — TETRACAINE HCL 0.5 % OP SOLN
1.0000 [drp] | Freq: Once | OPHTHALMIC | Status: AC
  Administered 2015-09-10: 1 [drp] via OPHTHALMIC

## 2015-09-10 MED ORDER — FENTANYL CITRATE (PF) 100 MCG/2ML IJ SOLN
INTRAMUSCULAR | Status: DC | PRN
  Administered 2015-09-10: 50 ug via INTRAVENOUS

## 2015-09-10 MED ORDER — POVIDONE-IODINE 5 % OP SOLN
1.0000 "application " | Freq: Once | OPHTHALMIC | Status: AC
  Administered 2015-09-10: 1 via OPHTHALMIC

## 2015-09-10 MED ORDER — LIDOCAINE HCL (PF) 4 % IJ SOLN
INTRAMUSCULAR | Status: AC
  Filled 2015-09-10: qty 5

## 2015-09-10 MED ORDER — TETRACAINE HCL 0.5 % OP SOLN
OPHTHALMIC | Status: AC
  Administered 2015-09-10: 1 [drp] via OPHTHALMIC
  Filled 2015-09-10: qty 2

## 2015-09-10 MED ORDER — MOXIFLOXACIN HCL 0.5 % OP SOLN
OPHTHALMIC | Status: AC
  Filled 2015-09-10: qty 3

## 2015-09-10 MED ORDER — MIDAZOLAM HCL 2 MG/2ML IJ SOLN
INTRAMUSCULAR | Status: DC | PRN
  Administered 2015-09-10: 1 mg via INTRAVENOUS

## 2015-09-10 MED ORDER — EPINEPHRINE HCL 1 MG/ML IJ SOLN
INTRAMUSCULAR | Status: DC | PRN
  Administered 2015-09-10: 1 mL via OPHTHALMIC

## 2015-09-10 MED ORDER — CEFUROXIME OPHTHALMIC INJECTION 1 MG/0.1 ML
INJECTION | OPHTHALMIC | Status: DC | PRN
  Administered 2015-09-10: .1 mL via INTRACAMERAL

## 2015-09-10 SURGICAL SUPPLY — 21 items
CANNULA ANT/CHMB 27GA (MISCELLANEOUS) ×2 IMPLANT
CUP MEDICINE 2OZ PLAST GRAD ST (MISCELLANEOUS) ×2 IMPLANT
GLOVE BIO SURGEON STRL SZ8 (GLOVE) ×2 IMPLANT
GLOVE BIOGEL M 6.5 STRL (GLOVE) ×2 IMPLANT
GLOVE SURG LX 8.0 MICRO (GLOVE) ×1
GLOVE SURG LX STRL 8.0 MICRO (GLOVE) ×1 IMPLANT
GOWN STRL REUS W/ TWL LRG LVL3 (GOWN DISPOSABLE) ×2 IMPLANT
GOWN STRL REUS W/TWL LRG LVL3 (GOWN DISPOSABLE) ×2
LENS IOL TECNIS ITEC 21.0 (Intraocular Lens) ×2 IMPLANT
PACK CATARACT (MISCELLANEOUS) ×2 IMPLANT
PACK CATARACT BRASINGTON LX (MISCELLANEOUS) ×2 IMPLANT
PACK EYE AFTER SURG (MISCELLANEOUS) ×2 IMPLANT
SOL BSS BAG (MISCELLANEOUS) ×2
SOL PREP PVP 2OZ (MISCELLANEOUS) ×2
SOLUTION BSS BAG (MISCELLANEOUS) ×1 IMPLANT
SOLUTION PREP PVP 2OZ (MISCELLANEOUS) ×1 IMPLANT
SYR 3ML LL SCALE MARK (SYRINGE) ×2 IMPLANT
SYR 5ML LL (SYRINGE) ×2 IMPLANT
SYR TB 1ML 27GX1/2 LL (SYRINGE) ×2 IMPLANT
WATER STERILE IRR 1000ML POUR (IV SOLUTION) ×2 IMPLANT
WIPE NON LINTING 3.25X3.25 (MISCELLANEOUS) ×2 IMPLANT

## 2015-09-10 NOTE — Anesthesia Preprocedure Evaluation (Addendum)
Anesthesia Evaluation  Patient identified by MRN, date of birth, ID band Patient awake    Reviewed: Allergy & Precautions, NPO status , Patient's Chart, lab work & pertinent test results, reviewed documented beta blocker date and time   Airway Mallampati: III  TM Distance: >3 FB     Dental  (+) Chipped, Missing, Poor Dentition, Dental Advisory Given   Pulmonary shortness of breath, sleep apnea, Continuous Positive Airway Pressure Ventilation and Oxygen sleep apnea , COPD,       (-) rales    Cardiovascular hypertension, Pt. on medications and Pt. on home beta blockers + Peripheral Vascular Disease and +CHF       Neuro/Psych    GI/Hepatic GERD  Controlled,  Endo/Other  diabetes, Type 2Morbid obesity  Renal/GU      Musculoskeletal   Abdominal   Peds  Hematology   Anesthesia Other Findings EF ok. Uses O2 with trilogy at nite.  Reproductive/Obstetrics                            Anesthesia Physical Anesthesia Plan  ASA: III  Anesthesia Plan: MAC   Post-op Pain Management:    Induction:   Airway Management Planned:   Additional Equipment:   Intra-op Plan:   Post-operative Plan:   Informed Consent: I have reviewed the patients History and Physical, chart, labs and discussed the procedure including the risks, benefits and alternatives for the proposed anesthesia with the patient or authorized representative who has indicated his/her understanding and acceptance.     Plan Discussed with: CRNA  Anesthesia Plan Comments:         Anesthesia Quick Evaluation

## 2015-09-10 NOTE — Discharge Instructions (Signed)
Eye Surgery Discharge Instructions  Expect mild scratchy sensation or mild soreness. DO NOT RUB YOUR EYE!  The day of surgery:  Minimal physical activity, but bed rest is not required  No reading, computer work, or close hand work  No bending, lifting, or straining.  May watch TV  For 24 hours:  No driving, legal decisions, or alcoholic beverages  Safety precautions  Eat anything you prefer: It is better to start with liquids, then soup then solid foods.  _____ Eye patch should be worn until postoperative exam tomorrow.  ____ Solar shield eyeglasses should be worn for comfort in the sunlight/patch while sleeping  Resume all regular medications including aspirin or Coumadin if these were discontinued prior to surgery. You may shower, bathe, shave, or wash your hair. Tylenol may be taken for mild discomfort.  Call your doctor if you experience significant pain, nausea, or vomiting, fever > 101 or other signs of infection. [phone] or [phone] Specific instructions:  Follow-up Information    Tim Lair, MD .   Specialty:  Ophthalmology Why:  tomorrow [phone] at 10:40 am Contact information: [place] [address] [phone]

## 2015-09-10 NOTE — Anesthesia Procedure Notes (Signed)
Procedure Name: MAC Performed by: COOK-MARTIN, Orlandus Borowski Pre-anesthesia Checklist: Patient identified, Emergency Drugs available, Suction available, Patient being monitored and Timeout performed Patient Re-evaluated:Patient Re-evaluated prior to inductionOxygen Delivery Method: Nasal cannula Preoxygenation: Pre-oxygenation with 100% oxygen Intubation Type: IV induction Placement Confirmation: positive ETCO2 and CO2 detector       

## 2015-09-10 NOTE — Op Note (Signed)
PREOPERATIVE DIAGNOSIS:  Nuclear sclerotic cataract of the right eye.   POSTOPERATIVE DIAGNOSIS: nuclear sclerotic cataract right eye   OPERATIVE PROCEDURE:  Procedure(s): CATARACT EXTRACTION PHACO AND INTRAOCULAR LENS PLACEMENT (IOC)   SURGEON:  Birder Robson, MD.   ANESTHESIA:  Anesthesiologist: Gunnar Bulla, MD CRNA: Vaughan Sine  1.      Managed anesthesia care. 2.      Topical tetracaine drops followed by 2% Xylocaine jelly applied in the preoperative holding area.   COMPLICATIONS:  None.   TECHNIQUE:   Stop and chop   DESCRIPTION OF PROCEDURE:  The patient was examined and consented in the preoperative holding area where the aforementioned topical anesthesia was applied to the right eye and then brought back to the Operating Room where the right eye was prepped and draped in the usual sterile ophthalmic fashion and a lid speculum was placed. A paracentesis was created with the side port blade and the anterior chamber was filled with viscoelastic. A near clear corneal incision was performed with the steel keratome. A continuous curvilinear capsulorrhexis was performed with a cystotome followed by the capsulorrhexis forceps. Hydrodissection and hydrodelineation were carried out with BSS on a blunt cannula. The lens was removed in a stop and chop  technique and the remaining cortical material was removed with the irrigation-aspiration handpiece. The capsular bag was inflated with viscoelastic and the Technis [id number]  lens was placed in the capsular bag without complication. The remaining viscoelastic was removed from the eye with the irrigation-aspiration handpiece. The wounds were hydrated. The anterior chamber was flushed with Miostat and the eye was inflated to physiologic pressure. 0.1 mL of cefuroxime concentration 10 mg/mL was placed in the anterior chamber. The wounds were found to be water tight. The eye was dressed with Vigamox. The patient was given protective glasses to wear  throughout the day and a shield with which to sleep tonight. The patient was also given drops with which to begin a drop regimen today and will follow-up with me in one day.  Implant Name Type Inv. Item Serial No. Manufacturer Lot No. LRB No. Used  LENS IOL DIOP 21.0 - [id number] Intraocular Lens LENS IOL DIOP 21.0 [id number]   Right 1   Procedure(s) with comments: CATARACT EXTRACTION PHACO AND INTRAOCULAR LENS PLACEMENT (IOC) (Right) - [place] 01:34 AP% 19.3 CDE 18.36 Fluid pack lot # [id number]  Electronically signed: [place] 09/10/2015 11:04 AM

## 2015-09-10 NOTE — Transfer of Care (Signed)
Immediate Anesthesia Transfer of Care Note  Patient: Kyeson Jemmott Macaraeg  Procedure(s) Performed: Procedure(s) with comments: CATARACT EXTRACTION PHACO AND INTRAOCULAR LENS PLACEMENT (IOC) (Right) - [place] 01:34 AP% 19.3 CDE 18.36 Fluid pack lot # [id number]  Patient Location: PACU and Short Stay  Anesthesia Type:MAC  Level of Consciousness: awake, alert  and oriented  Airway & Oxygen Therapy: Patient Spontanous Breathing  Post-op Assessment: Report given to RN and Post -op Vital signs reviewed and stable  Post vital signs: Reviewed and stable  Last Vitals:  Vitals:   09/10/15 0943  BP: 137/65  Pulse: 71  Resp: 20  Temp: 36.7 C    Last Pain:  Vitals:   09/10/15 0943  TempSrc: Oral         Complications: No apparent anesthesia complications

## 2015-09-10 NOTE — H&P (Signed)
  All labs reviewed. Abnormal studies sent to patients PCP when indicated.  Previous H&P reviewed, patient examined, there are NO CHANGES.  Raymond [name],[name]8/10/201710:35 AM

## 2015-09-10 NOTE — Anesthesia Postprocedure Evaluation (Signed)
Anesthesia Post Note  Patient: Raymond [name]  Procedure(s) Performed: Procedure(s) (LRB): CATARACT EXTRACTION PHACO AND INTRAOCULAR LENS PLACEMENT (IOC) (Right)  Patient location during evaluation: Other Anesthesia Type: MAC Level of consciousness: awake and alert Pain management: pain level controlled Vital Signs Assessment: post-procedure vital signs reviewed and stable Respiratory status: spontaneous breathing, nonlabored ventilation, respiratory function stable and patient connected to nasal cannula oxygen Cardiovascular status: stable and blood pressure returned to baseline Anesthetic complications: no    Last Vitals:  Vitals:   09/10/15 1114 09/10/15 1122  BP: (!) 146/68 (!) 147/68  Pulse: 66 65  Resp: 20 20  Temp:      Last Pain:  Vitals:   09/10/15 1122  TempSrc:   PainSc: 0-No pain                 Dynasty Holquin S

## 2015-09-21 ENCOUNTER — Telehealth: Payer: Self-pay

## 2015-09-21 NOTE — Telephone Encounter (Signed)
Received paper from Jeneen Rinks with Raymond [name] stating that pt will need a f/u appt with VM to assure pt is compliant and benefiting from his trilogy machine per pt insurance. I have attempted to call pt and will await call back.

## 2015-09-24 NOTE — Telephone Encounter (Signed)
LMOVM X2 Will await call back.

## 2015-09-29 ENCOUNTER — Encounter: Payer: Self-pay | Admitting: *Deleted

## 2015-10-06 ENCOUNTER — Ambulatory Visit: Payer: Medicare PPO | Admitting: Anesthesiology

## 2015-10-06 ENCOUNTER — Encounter: Payer: Self-pay | Admitting: *Deleted

## 2015-10-06 ENCOUNTER — Encounter
Admission: RE | Disposition: A | Discharge status: Home / Self Care | Payer: Self-pay | Source: Ambulatory Visit | Attending: Ophthalmology

## 2015-10-06 ENCOUNTER — Ambulatory Visit
Admission: RE | Admit: 2015-10-06 | Discharge: 2015-10-06 | Disposition: A | Discharge status: Home / Self Care | Payer: Medicare PPO | Source: Ambulatory Visit | Attending: Ophthalmology | Admitting: Ophthalmology

## 2015-10-06 DIAGNOSIS — G4730 Sleep apnea, unspecified: Secondary | ICD-10-CM | POA: Insufficient documentation

## 2015-10-06 DIAGNOSIS — R0601 Orthopnea: Secondary | ICD-10-CM | POA: Insufficient documentation

## 2015-10-06 DIAGNOSIS — Z6841 Body Mass Index (BMI) 40.0 and over, adult: Secondary | ICD-10-CM | POA: Diagnosis not present

## 2015-10-06 DIAGNOSIS — E1136 Type 2 diabetes mellitus with diabetic cataract: Secondary | ICD-10-CM | POA: Insufficient documentation

## 2015-10-06 DIAGNOSIS — M7989 Other specified soft tissue disorders: Secondary | ICD-10-CM | POA: Diagnosis not present

## 2015-10-06 DIAGNOSIS — H2512 Age-related nuclear cataract, left eye: Secondary | ICD-10-CM | POA: Diagnosis present

## 2015-10-06 DIAGNOSIS — Z9049 Acquired absence of other specified parts of digestive tract: Secondary | ICD-10-CM | POA: Insufficient documentation

## 2015-10-06 DIAGNOSIS — I509 Heart failure, unspecified: Secondary | ICD-10-CM | POA: Insufficient documentation

## 2015-10-06 DIAGNOSIS — I110 Hypertensive heart disease with heart failure: Secondary | ICD-10-CM | POA: Insufficient documentation

## 2015-10-06 DIAGNOSIS — E1140 Type 2 diabetes mellitus with diabetic neuropathy, unspecified: Secondary | ICD-10-CM | POA: Insufficient documentation

## 2015-10-06 DIAGNOSIS — Z9849 Cataract extraction status, unspecified eye: Secondary | ICD-10-CM | POA: Insufficient documentation

## 2015-10-06 DIAGNOSIS — H9190 Unspecified hearing loss, unspecified ear: Secondary | ICD-10-CM | POA: Diagnosis not present

## 2015-10-06 DIAGNOSIS — K219 Gastro-esophageal reflux disease without esophagitis: Secondary | ICD-10-CM | POA: Diagnosis not present

## 2015-10-06 DIAGNOSIS — Z9981 Dependence on supplemental oxygen: Secondary | ICD-10-CM | POA: Diagnosis not present

## 2015-10-06 DIAGNOSIS — K760 Fatty (change of) liver, not elsewhere classified: Secondary | ICD-10-CM | POA: Diagnosis not present

## 2015-10-06 HISTORY — PX: CATARACT EXTRACTION W/PHACO: SHX586

## 2015-10-06 HISTORY — DX: Hypoxemia: R09.02

## 2015-10-06 HISTORY — DX: Polyneuropathy, unspecified: G62.9

## 2015-10-06 HISTORY — DX: Edema, unspecified: R60.9

## 2015-10-06 HISTORY — DX: Reserved for inherently not codable concepts without codable children: IMO0001

## 2015-10-06 HISTORY — DX: Orthopnea: R06.01

## 2015-10-06 LAB — GLUCOSE, CAPILLARY
Glucose-Capillary: 335 mg/dL — ABNORMAL HIGH (ref 65–99)
Glucose-Capillary: 234 mg/dL — ABNORMAL HIGH (ref 65–99)

## 2015-10-06 SURGERY — PHACOEMULSIFICATION, CATARACT, WITH IOL INSERTION
Anesthesia: Monitor Anesthesia Care | Site: Eye | Laterality: Left | Wound class: Clean

## 2015-10-06 MED ORDER — MIDAZOLAM HCL 5 MG/5ML IJ SOLN
INTRAMUSCULAR | Status: DC | PRN
  Administered 2015-10-06: 1 mg via INTRAVENOUS

## 2015-10-06 MED ORDER — TETRACAINE HCL 0.5 % OP SOLN
1.0000 [drp] | Freq: Once | OPHTHALMIC | Status: AC
  Administered 2015-10-06: 1 [drp] via OPHTHALMIC

## 2015-10-06 MED ORDER — CARBACHOL 0.01 % IO SOLN
INTRAOCULAR | Status: DC | PRN
  Administered 2015-10-06: .5 mL via INTRAOCULAR

## 2015-10-06 MED ORDER — EPINEPHRINE HCL 1 MG/ML IJ SOLN
INTRAMUSCULAR | Status: AC
  Filled 2015-10-06: qty 2

## 2015-10-06 MED ORDER — MOXIFLOXACIN HCL 0.5 % OP SOLN
OPHTHALMIC | Status: AC
  Filled 2015-10-06: qty 3

## 2015-10-06 MED ORDER — MOXIFLOXACIN HCL 0.5 % OP SOLN: 1.0000 [drp] | OPHTHALMIC | Status: AC | PRN

## 2015-10-06 MED ORDER — NA CHONDROIT SULF-NA HYALURON 40-17 MG/ML IO SOLN
INTRAOCULAR | Status: AC
  Filled 2015-10-06: qty 1

## 2015-10-06 MED ORDER — CEFUROXIME OPHTHALMIC INJECTION 1 MG/0.1 ML
INJECTION | OPHTHALMIC | Status: AC
  Filled 2015-10-06: qty 0.1

## 2015-10-06 MED ORDER — FENTANYL CITRATE (PF) 100 MCG/2ML IJ SOLN
INTRAMUSCULAR | Status: DC | PRN
  Administered 2015-10-06: 50 ug via INTRAVENOUS

## 2015-10-06 MED ORDER — INSULIN ASPART 100 UNIT/ML ~~LOC~~ SOLN
SUBCUTANEOUS | Status: AC
  Filled 2015-10-06: qty 6

## 2015-10-06 MED ORDER — LIDOCAINE HCL (PF) 4 % IJ SOLN
INTRAOCULAR | Status: DC | PRN
  Administered 2015-10-06: .5 mL via OPHTHALMIC

## 2015-10-06 MED ORDER — INSULIN ASPART 100 UNIT/ML ~~LOC~~ SOLN
6.0000 [IU] | Freq: Once | SUBCUTANEOUS | Status: AC
  Administered 2015-10-06: 6 [IU] via SUBCUTANEOUS

## 2015-10-06 MED ORDER — POVIDONE-IODINE 5 % OP SOLN
OPHTHALMIC | Status: AC
  Filled 2015-10-06: qty 30

## 2015-10-06 MED ORDER — ARMC OPHTHALMIC DILATING GEL
OPHTHALMIC | Status: AC
  Administered 2015-10-06: 1 via OPHTHALMIC
  Filled 2015-10-06: qty 0.25

## 2015-10-06 MED ORDER — CEFUROXIME OPHTHALMIC INJECTION 1 MG/0.1 ML
INJECTION | OPHTHALMIC | Status: DC | PRN
  Administered 2015-10-06: .1 mL via INTRACAMERAL

## 2015-10-06 MED ORDER — NA CHONDROIT SULF-NA HYALURON 40-17 MG/ML IO SOLN
INTRAOCULAR | Status: DC | PRN
  Administered 2015-10-06: 1 mL via INTRAOCULAR

## 2015-10-06 MED ORDER — MOXIFLOXACIN HCL 0.5 % OP SOLN
OPHTHALMIC | Status: DC | PRN
  Administered 2015-10-06: 1 [drp] via OPHTHALMIC

## 2015-10-06 MED ORDER — SODIUM CHLORIDE 0.9 % IV SOLN
INTRAVENOUS | Status: DC
  Administered 2015-10-06 (×2): via INTRAVENOUS

## 2015-10-06 MED ORDER — TETRACAINE HCL 0.5 % OP SOLN
OPHTHALMIC | Status: AC
  Filled 2015-10-06: qty 2

## 2015-10-06 MED ORDER — EPINEPHRINE HCL 1 MG/ML IJ SOLN
INTRAMUSCULAR | Status: DC | PRN
  Administered 2015-10-06: 1 mL via OPHTHALMIC

## 2015-10-06 MED ORDER — LIDOCAINE HCL (PF) 4 % IJ SOLN
INTRAMUSCULAR | Status: AC
  Filled 2015-10-06: qty 5

## 2015-10-06 MED ORDER — ARMC OPHTHALMIC DILATING GEL
1.0000 "application " | OPHTHALMIC | Status: AC | PRN
  Administered 2015-10-06 (×2): 1 via OPHTHALMIC

## 2015-10-06 MED ORDER — POVIDONE-IODINE 5 % OP SOLN
1.0000 "application " | Freq: Once | OPHTHALMIC | Status: AC
  Administered 2015-10-06: 1 via OPHTHALMIC

## 2015-10-06 SURGICAL SUPPLY — 21 items
CANNULA ANT/CHMB 27GA (MISCELLANEOUS) ×2 IMPLANT
CUP MEDICINE 2OZ PLAST GRAD ST (MISCELLANEOUS) ×2 IMPLANT
GLOVE BIO SURGEON STRL SZ8 (GLOVE) ×2 IMPLANT
GLOVE BIOGEL M 6.5 STRL (GLOVE) ×2 IMPLANT
GLOVE SURG LX 8.0 MICRO (GLOVE) ×1
GLOVE SURG LX STRL 8.0 MICRO (GLOVE) ×1 IMPLANT
GOWN STRL REUS W/ TWL LRG LVL3 (GOWN DISPOSABLE) ×2 IMPLANT
GOWN STRL REUS W/TWL LRG LVL3 (GOWN DISPOSABLE) ×2
LENS IOL TECNIS ITEC 20.5 (Intraocular Lens) ×2 IMPLANT
PACK CATARACT (MISCELLANEOUS) ×2 IMPLANT
PACK CATARACT BRASINGTON LX (MISCELLANEOUS) ×2 IMPLANT
PACK EYE AFTER SURG (MISCELLANEOUS) ×2 IMPLANT
SOL BSS BAG (MISCELLANEOUS) ×2
SOL PREP PVP 2OZ (MISCELLANEOUS) ×2
SOLUTION BSS BAG (MISCELLANEOUS) ×1 IMPLANT
SOLUTION PREP PVP 2OZ (MISCELLANEOUS) ×1 IMPLANT
SYR 3ML LL SCALE MARK (SYRINGE) ×2 IMPLANT
SYR 5ML LL (SYRINGE) ×2 IMPLANT
SYR TB 1ML 27GX1/2 LL (SYRINGE) ×2 IMPLANT
WATER STERILE IRR 250ML POUR (IV SOLUTION) ×2 IMPLANT
WIPE NON LINTING 3.25X3.25 (MISCELLANEOUS) ×2 IMPLANT

## 2015-10-06 NOTE — Op Note (Signed)
PREOPERATIVE DIAGNOSIS:  Nuclear sclerotic cataract of the left eye.   POSTOPERATIVE DIAGNOSIS:  nuclear sclerotic cataract left eye   OPERATIVE PROCEDURE:  Procedure(s): CATARACT EXTRACTION PHACO AND INTRAOCULAR LENS PLACEMENT (IOC)   SURGEON:  Birder Robson, MD.   ANESTHESIA:   Anesthesiologist: Alvin Critchley, MD CRNA: Silvana Newness, CRNA; Allean Found, CRNA  1.      Managed anesthesia care. 2.      Topical tetracaine drops followed by 2% Xylocaine jelly applied in the preoperative holding area.        3.   0.2 ml of epi-Shugarcaine was  placed in the anterior chamber following the paracentesis.    COMPLICATIONS:  None.   TECHNIQUE:   Stop and chop   DESCRIPTION OF PROCEDURE:  The patient was examined and consented in the preoperative holding area where the aforementioned topical anesthesia was applied to the left eye and then brought back to the Operating Room where the left eye was prepped and draped in the usual sterile ophthalmic fashion and a lid speculum was placed. A paracentesis was created with the side port blade and the anterior chamber was filled with viscoelastic. A near clear corneal incision was performed with the steel keratome. A continuous curvilinear capsulorrhexis was performed with a cystotome followed by the capsulorrhexis forceps. Hydrodissection and hydrodelineation were carried out with BSS on a blunt cannula. The lens was removed in a stop and chop  technique and the remaining cortical material was removed with the irrigation-aspiration handpiece. The capsular bag was inflated with viscoelastic and the Technis [id number] lens was placed in the capsular bag without complication. The remaining viscoelastic was removed from the eye with the irrigation-aspiration handpiece. The wounds were hydrated. The anterior chamber was flushed with Miostat and the eye was inflated to physiologic pressure. 0.1 mL of cefuroxime concentration 10 mg/mL was placed in the anterior chamber.  The wounds were found to be water tight. The eye was dressed with Vigamox. The patient was given protective glasses to wear throughout the day and a shield with which to sleep tonight. The patient was also given drops with which to begin a drop regimen today and will follow-up with me in one day.  Implant Name Type Inv. Item Serial No. Manufacturer Lot No. LRB No. Used  LENS IOL DIOP 20.5 - [id number] Intraocular Lens LENS IOL DIOP 20.5 [id number] AMO   Left 1   Procedure(s) with comments: CATARACT EXTRACTION PHACO AND INTRAOCULAR LENS PLACEMENT (IOC) (Left) - [place] 00:56 AP% 19.5 CDE 11.02 Fluid Pack # [id number]  Electronically signed: [place] 10/06/2015 11:53 AM

## 2015-10-06 NOTE — Discharge Instructions (Signed)
Eye Surgery Discharge Instructions  Expect mild scratchy sensation or mild soreness. DO NOT RUB YOUR EYE!  The day of surgery:  Minimal physical activity, but bed rest is not required  No reading, computer work, or close hand work  No bending, lifting, or straining.  May watch TV  For 24 hours:  No driving, legal decisions, or alcoholic beverages  Safety precautions  Eat anything you prefer: It is better to start with liquids, then soup then solid foods.  _____ Eye patch should be worn until postoperative exam tomorrow.  ____ Solar shield eyeglasses should be worn for comfort in the sunlight/patch while sleeping  Resume all regular medications including aspirin or Coumadin if these were discontinued prior to surgery. You may shower, bathe, shave, or wash your hair. Tylenol may be taken for mild discomfort.  Call your doctor if you experience significant pain, nausea, or vomiting, fever > 101 or other signs of infection. [phone] or [phone] Specific instructions:  Follow-up Information    Tim Lair, MD .   Specialty:  Ophthalmology Why:  September 6 at 9:05am Contact information: [address] [place] [address] [phone]

## 2015-10-06 NOTE — Transfer of Care (Signed)
Immediate Anesthesia Transfer of Care Note  Patient: Raymond [name]  Procedure(s) Performed: Procedure(s) with comments: CATARACT EXTRACTION PHACO AND INTRAOCULAR LENS PLACEMENT (IOC) (Left) - [place] 00:56 AP% 19.5 CDE 11.02 Fluid Pack # [id number]  Patient Location: PACU  Anesthesia Type:MAC  Level of Consciousness: awake  Airway & Oxygen Therapy: Patient Spontanous Breathing  Post-op Assessment: Report given to RN  Post vital signs: Reviewed and stable  Last Vitals:  Vitals:   10/06/15 1017 10/06/15 1149  BP: (!) 173/77 127/60  Pulse: 68 61  Resp: 18 18  Temp: 36.8 C 36.5 C    Last Pain:  Vitals:   10/06/15 1017  TempSrc: Oral         Complications: No apparent anesthesia complications

## 2015-10-06 NOTE — H&P (Signed)
  All labs reviewed. Abnormal studies sent to patients PCP when indicated.  Previous H&P reviewed, patient examined, there are NO CHANGES.  Raymond [name],Raymond LOUIS9/5/201711:26 AM

## 2015-10-06 NOTE — Anesthesia Preprocedure Evaluation (Signed)
Anesthesia Evaluation  Patient identified by MRN, date of birth, ID band Patient awake    Reviewed: Allergy & Precautions, NPO status , Patient's Chart, lab work & pertinent test results, reviewed documented beta blocker date and time   Airway Mallampati: III  TM Distance: >3 FB     Dental  (+) Chipped, Missing, Poor Dentition, Dental Advisory Given   Pulmonary shortness of breath and with exertion, sleep apnea, Continuous Positive Airway Pressure Ventilation and Oxygen sleep apnea , COPD,  COPD inhaler and oxygen dependent,       (-) rales    Cardiovascular hypertension, Pt. on medications and Pt. on home beta blockers + Peripheral Vascular Disease, +CHF and + Orthopnea       Neuro/Psych negative neurological ROS  negative psych ROS   GI/Hepatic GERD  Medicated and Controlled,Hepatic steatosis   Endo/Other  diabetes, Type 2, Oral Hypoglycemic Agents, Insulin DependentMorbid obesity  Renal/GU negative Renal ROS  negative genitourinary   Musculoskeletal  (+) Arthritis , Osteoarthritis,    Abdominal   Peds negative pediatric ROS (+)  Hematology negative hematology ROS (+)   Anesthesia Other Findings EF ok. Uses O2 with trilogy at nite.  Reproductive/Obstetrics                             Anesthesia Physical  Anesthesia Plan  ASA: IV  Anesthesia Plan: MAC   Post-op Pain Management:    Induction:   Airway Management Planned: Nasal Cannula  Additional Equipment:   Intra-op Plan:   Post-operative Plan:   Informed Consent: I have reviewed the patients History and Physical, chart, labs and discussed the procedure including the risks, benefits and alternatives for the proposed anesthesia with the patient or authorized representative who has indicated his/her understanding and acceptance.     Plan Discussed with: CRNA and Surgeon  Anesthesia Plan Comments:         Anesthesia  Quick Evaluation

## 2015-10-06 NOTE — Anesthesia Postprocedure Evaluation (Signed)
Anesthesia Post Note  Patient: Raymond [name]  Procedure(s) Performed: Procedure(s) (LRB): CATARACT EXTRACTION PHACO AND INTRAOCULAR LENS PLACEMENT (IOC) (Left)  Patient location during evaluation: PACU Anesthesia Type: General Level of consciousness: awake Pain management: pain level controlled Vital Signs Assessment: post-procedure vital signs reviewed and stable Respiratory status: spontaneous breathing Cardiovascular status: blood pressure returned to baseline Postop Assessment: no headache Anesthetic complications: no    Last Vitals:  Vitals:   10/06/15 1017 10/06/15 1149  BP: (!) 173/77 127/60  Pulse: 68 61  Resp: 18 18  Temp: 36.8 C 36.5 C    Last Pain:  Vitals:   10/06/15 1017  TempSrc: Oral                 Buckner Malta

## 2015-10-06 NOTE — Anesthesia Procedure Notes (Signed)
Procedure Name: MAC Date/Time: 10/06/2015 11:30 AM Performed by: Allean Found Pre-anesthesia Checklist: Patient identified, Emergency Drugs available, Suction available, Patient being monitored and Timeout performed Patient Re-evaluated:Patient Re-evaluated prior to inductionOxygen Delivery Method: Nasal cannula

## 2015-10-08 ENCOUNTER — Encounter: Payer: Self-pay | Admitting: Internal Medicine

## 2015-10-08 ENCOUNTER — Ambulatory Visit (INDEPENDENT_AMBULATORY_CARE_PROVIDER_SITE_OTHER): Payer: Medicare PPO | Admitting: Internal Medicine

## 2015-10-08 VITALS — BP 144/84 | HR 72 | Ht 74.0 in | Wt 388.0 lb

## 2015-10-08 DIAGNOSIS — G4733 Obstructive sleep apnea (adult) (pediatric): Secondary | ICD-10-CM

## 2015-10-08 DIAGNOSIS — J449 Chronic obstructive pulmonary disease, unspecified: Secondary | ICD-10-CM

## 2015-10-08 NOTE — Assessment & Plan Note (Signed)
Obstructive disease, such as COPD, could be adding to his dyspnea on exertion. Pulmonary function tests were resulted moderate obstruction, with severe reduction in ERV and mild to moderate reduction in DLCO; reduction DLCO could be seen from heart disease or prolonged exposure to tobacco. Patient is a never smoker, but was exposed to secondhand smoke for a number of years as a child from his parents. Given his level of obstruction and a never smoker will also further workup with alpha-1 antitrypsin level. Today, we also discussed the results of his pulmonary function tests in the level of obstruction.  At this time we will cont with Advair 500/50,  Plan: -Weight loss, exercise, healthy diet. -Advair 500/50-1 puff twice a day, gargle and rinse after each use -Avoid any forms of tobacco (cigarette smoking, vapors, E cigarettes, etc.).  - HTN control

## 2015-10-08 NOTE — Patient Instructions (Signed)
Follow up with Dr. Stevenson Clinch in:2 months - cont with use of triology machine, please contact [employer] to get disc/memory card the the download of the compliance report - cont with inhalers - cont with CHF regiment and BP control

## 2015-10-08 NOTE — Assessment & Plan Note (Signed)
The split study on 05/21/2014 showed AHI 106.3 with RDI of 16.3, CPAP was titrated to 14 cm of water, patient also required 2 L of oxygen.  Upon discharge from hospital and March 2015 he was placed on trilogy machine (NIPPV).  Continues to use triology machine with good self reported compliance  Plan: -Continue using trilogy machine as directed, on a nightly basis - 2 L of Oxygen with sleep -split night study done with results of cpap 14cm H20.  However, patient is on trilogy machine from last hospitalization, will request setting from DME, and adjust accordingly. Hopefully, patient is available for the download -Weight loss, exercise as tolerated.

## 2015-10-08 NOTE — Progress Notes (Signed)
MRN# [id number] Raymond [name] 03/24/1965   CC: Short of breath Chief Complaint  Patient presents with  . Follow-up    SOB w/excertion; uses trilogy nightly; no other concerns      Brief History: HPI 04/28/14 Patient is a pleasant 50 year old male presents today for hospital followup visit of chronic respiratory failure secondary to obesity, obstructive sleep apnea, systolic CHF. Patient's past medical history of morbid obesity, diabetes, hypertension, systolic CHF (ejection fraction 50-55%). For full hospital admission details see below. Briefly, patient had one week of shortness of breath and presented to the hospital, noted to have desaturations in the mid 80s, was placed on BiPAP with moderate improvement, and discharged on a trilogy machine. Patient states that he has a diagnosis of sleep apnea since 2010, was initially on CPAP, and 2013 had arepeat sleep study, was placed on BiPAP 27-21, but has not been wearing his BiPAP machine on a nightly basis (maybe one time a). During hospitalization he was suspected to have untreated sleep apnea along with obesity hypoventilation syndrome. Today he presents for followup of sleep apnea and optimization of his sleep apnea along with his chronic hypoxic respiratory failure. During his visit today on room air he was noted to be satting anywhere between 88 and 91%. He recently followed up with the CHF clinic and is currently on twice a day diuretics. However, Per review of his medication list he is currently on furosemide 40 mg daily Patient states that he does have occasional swelling, he is able to walk but does have to wear 2 L of oxygen, he has not try climbing stairs recently, but thinks that he would become severely short of breath if he does try to climb stairs.  Plan:PFTs, 6MWT, PSG (split), Wt. Loss, CHF optimization  Events since last clinic visit: Patient presents for a follow up visit of DOE and occasional dyspnea at  rest. Overall no worsening of clinical status. Since last visit he has been doing well, no worsening pulmonary status. Did have cataracts, bilateral, surgery on 9/5, no complications. He continues to follow along with CHF clinic, and has seen cardiology who has optimized his CHF regiment. He is using his trilogy machine (for sleep apnea) nightly, however, no download available today, as Huey Romans has not been able to make successful contact with him. States that he is wearing triology nightly  Medication:    Current Outpatient Prescriptions:  .  amLODipine (NORVASC) 10 MG tablet, Take 10 mg by mouth daily., Disp: , Rfl:  .  aspirin EC 81 MG tablet, Take 81 mg by mouth daily., Disp: , Rfl:  .  carvedilol (COREG) 12.5 MG tablet, Take 12.5 mg by mouth 2 (two) times daily. , Disp: , Rfl:  .  enalapril (VASOTEC) 20 MG tablet, Take 40 mg by mouth daily. , Disp: , Rfl:  .  furosemide (LASIX) 40 MG tablet, Take 1 tablet (40 mg total) by mouth 2 (two) times daily as needed. (Patient taking differently: Take 40 mg by mouth 2 (two) times daily as needed for edema. ), Disp: 60 tablet, Rfl: 11 .  gabapentin (NEURONTIN) 300 MG capsule, Take 300 mg by mouth 3 (three) times daily., Disp: , Rfl:  .  insulin detemir (LEVEMIR) 100 UNIT/ML injection, Inject 75 Units into the skin 2 (two) times daily. , Disp: , Rfl:  .  insulin lispro (HUMALOG) 100 UNIT/ML cartridge, Inject 60 Units into the skin 3 (three) times daily with meals., Disp: , Rfl:  .  metFORMIN (GLUCOPHAGE)  1000 MG tablet, Take 1,000 mg by mouth 2 (two) times daily with a meal., Disp: , Rfl:  .  pantoprazole (PROTONIX) 40 MG tablet, Take 40 mg by mouth daily., Disp: , Rfl:  .  potassium chloride (MICRO-K) 10 MEQ CR capsule, Take 10 mEq by mouth 2 (two) times daily., Disp: , Rfl:     Review of Systems: Gen:  Denies  fever, sweats, chills HEENT: Denies blurred vision, double vision, ear pain, eye pain, hearing loss, nose bleeds, sore throat Cvc:  No  dizziness, chest pain or heaviness Resp:   Admits to: Shortness of breath with exertion (chronic) Gi: Denies swallowing difficulty, stomach pain, nausea or vomiting, diarrhea, constipation, bowel incontinence Gu:  Denies bladder incontinence, burning urine Ext:   No Joint pain, stiffness or swelling Skin: No skin rash, easy bruising or bleeding or hives Endoc:  No polyuria, polydipsia , polyphagia or weight change Other:  All other systems negative  Allergies:  Review of patient's allergies indicates no known allergies.  Physical Examination:  VS: BP (!) 144/84 (BP Location: Left Arm, Cuff Size: Normal)   Pulse 72   Ht 6\' 2"  (1.88 m)   Wt (!) 388 lb (176 kg)   SpO2 95%   BMI 49.82 kg/m   General Appearance: No distress  HEENT: PERRLA, no ptosis, no other lesions noticed Pulmonary:normal breath sounds., diaphragmatic excursion normal.No wheezing, No rales   Cardiovascular:  Normal S1,S2.  No m/r/g.     Abdomen:Exam: Benign, Soft, non-tender, No masses  Skin:   warm, no rashes, no ecchymosis  Extremities: normal, no cyanosis, clubbing, warm with normal capillary refill.  Trace pitting edema in bilateral lower extremities  Results: (The following results were reviewed by Dr. Stevenson Clinch). Pulmonary function testing 06/17/2014 FVC 55% FEV1 51% FEV1/FVC 75% SVC 69 IC 86% RV 67% TLC 60% RV/TLC 105% ERV 10% DLCO uncorrected 70%  Impression - GERD obstruction, without significant bronchodilation response. Severe reduction in ERV, could be related to body habitus, chest wall/abdominal wall obesity. Mild to moderate decrease in DLCO.   6 minute walk test-240 m/803 feet, lowest saturation 91%, highest heart rate 92  Assessment and Plan: 50 yo male with history of systolic heart failure, obstructive sleep apnea, morbid obesity, seen in consultation follow-up for dyspnea on exertion and results of ordered studies. OSA treated with Trilogy machine The split study on 05/21/2014 showed AHI  106.3 with RDI of 16.3, CPAP was titrated to 14 cm of water, patient also required 2 L of oxygen.  Upon discharge from hospital and March 2015 he was placed on trilogy machine (NIPPV).  Continues to use triology machine with good self reported compliance  Plan: -Continue using trilogy machine as directed, on a nightly basis - 2 L of Oxygen with sleep -split night study done with results of cpap 14cm H20.  However, patient is on trilogy machine from last hospitalization, will request setting from DME, and adjust accordingly. Hopefully, patient is available for the download -Weight loss, exercise as tolerated.    COPD, moderate Obstructive disease, such as COPD, could be adding to his dyspnea on exertion. Pulmonary function tests were resulted moderate obstruction, with severe reduction in ERV and mild to moderate reduction in DLCO; reduction DLCO could be seen from heart disease or prolonged exposure to tobacco. Patient is a never smoker, but was exposed to secondhand smoke for a number of years as a child from his parents. Given his level of obstruction and a never smoker will also further workup  with alpha-1 antitrypsin level. Today, we also discussed the results of his pulmonary function tests in the level of obstruction.  At this time we will cont with Advair 500/50,  Plan: -Weight loss, exercise, healthy diet. -Advair 500/50-1 puff twice a day, gargle and rinse after each use -Avoid any forms of tobacco (cigarette smoking, vapors, E cigarettes, etc.).  - HTN control    Updated Medication List Outpatient Encounter Prescriptions as of 10/08/2015  Medication Sig  . amLODipine (NORVASC) 10 MG tablet Take 10 mg by mouth daily.  Marland Kitchen aspirin EC 81 MG tablet Take 81 mg by mouth daily.  . carvedilol (COREG) 12.5 MG tablet Take 12.5 mg by mouth 2 (two) times daily.   . enalapril (VASOTEC) 20 MG tablet Take 40 mg by mouth daily.   . furosemide (LASIX) 40 MG tablet Take 1 tablet (40 mg total)  by mouth 2 (two) times daily as needed. (Patient taking differently: Take 40 mg by mouth 2 (two) times daily as needed for edema. )  . gabapentin (NEURONTIN) 300 MG capsule Take 300 mg by mouth 3 (three) times daily.  . insulin detemir (LEVEMIR) 100 UNIT/ML injection Inject 75 Units into the skin 2 (two) times daily.   . insulin lispro (HUMALOG) 100 UNIT/ML cartridge Inject 60 Units into the skin 3 (three) times daily with meals.  . metFORMIN (GLUCOPHAGE) 1000 MG tablet Take 1,000 mg by mouth 2 (two) times daily with a meal.  . pantoprazole (PROTONIX) 40 MG tablet Take 40 mg by mouth daily.  . potassium chloride (MICRO-K) 10 MEQ CR capsule Take 10 mEq by mouth 2 (two) times daily.   No facility-administered encounter medications on file as of 10/08/2015.     Orders for this visit: No orders of the defined types were placed in this encounter.   Thank  you for the visitation and for allowing  [facility] to assist in the care of your patient. Our recommendations are noted above.  Please contact us if we can be of further service.  Vilinda Boehringer, MD [facility] Office Number: [phone]

## 2015-10-12 ENCOUNTER — Encounter: Payer: Self-pay | Admitting: Internal Medicine

## 2015-12-09 ENCOUNTER — Telehealth: Payer: Self-pay | Admitting: Internal Medicine

## 2015-12-09 NOTE — Telephone Encounter (Signed)
lmov to schedule sleep consult with Dr. Juanell Fairly per [facility] .

## 2015-12-10 NOTE — Telephone Encounter (Signed)
lmov to call office to schedule an appt.  2nd attempt to contact.

## 2015-12-21 ENCOUNTER — Ambulatory Visit: Payer: Medicare PPO | Admitting: Internal Medicine

## 2016-01-01 ENCOUNTER — Institutional Professional Consult (permissible substitution): Payer: Medicare PPO | Admitting: Internal Medicine
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
## 2016-01-14 ENCOUNTER — Ambulatory Visit (INDEPENDENT_AMBULATORY_CARE_PROVIDER_SITE_OTHER): Payer: Medicare PPO | Admitting: Internal Medicine

## 2016-01-14 ENCOUNTER — Encounter: Payer: Self-pay | Admitting: Internal Medicine

## 2016-01-14 VITALS — BP 130/62 | HR 75 | Ht 74.0 in | Wt 386.0 lb

## 2016-01-14 DIAGNOSIS — E662 Morbid (severe) obesity with alveolar hypoventilation: Secondary | ICD-10-CM | POA: Diagnosis not present

## 2016-01-14 DIAGNOSIS — G4733 Obstructive sleep apnea (adult) (pediatric): Secondary | ICD-10-CM

## 2016-01-14 NOTE — Patient Instructions (Signed)
Continue using trilogy machine every night.

## 2016-04-21 ENCOUNTER — Emergency Department: Payer: Medicare PPO

## 2016-04-21 ENCOUNTER — Encounter: Payer: Self-pay | Admitting: *Deleted

## 2016-04-21 ENCOUNTER — Emergency Department
Admission: EM | Admit: 2016-04-21 | Discharge: 2016-04-21 | Disposition: A | Discharge status: Home / Self Care | Payer: Medicare PPO | Attending: Emergency Medicine | Admitting: Emergency Medicine

## 2016-04-21 DIAGNOSIS — Y92410 Unspecified street and highway as the place of occurrence of the external cause: Secondary | ICD-10-CM | POA: Insufficient documentation

## 2016-04-21 DIAGNOSIS — Y999 Unspecified external cause status: Secondary | ICD-10-CM | POA: Diagnosis not present

## 2016-04-21 DIAGNOSIS — S62623A Displaced fracture of medial phalanx of left middle finger, initial encounter for closed fracture: Secondary | ICD-10-CM | POA: Diagnosis not present

## 2016-04-21 DIAGNOSIS — Z794 Long term (current) use of insulin: Secondary | ICD-10-CM | POA: Insufficient documentation

## 2016-04-21 DIAGNOSIS — E119 Type 2 diabetes mellitus without complications: Secondary | ICD-10-CM | POA: Diagnosis not present

## 2016-04-21 DIAGNOSIS — S6992XA Unspecified injury of left wrist, hand and finger(s), initial encounter: Secondary | ICD-10-CM | POA: Diagnosis present

## 2016-04-21 DIAGNOSIS — I5032 Chronic diastolic (congestive) heart failure: Secondary | ICD-10-CM | POA: Insufficient documentation

## 2016-04-21 DIAGNOSIS — J449 Chronic obstructive pulmonary disease, unspecified: Secondary | ICD-10-CM | POA: Insufficient documentation

## 2016-04-21 DIAGNOSIS — Y9389 Activity, other specified: Secondary | ICD-10-CM | POA: Insufficient documentation

## 2016-04-21 DIAGNOSIS — Z7982 Long term (current) use of aspirin: Secondary | ICD-10-CM | POA: Insufficient documentation

## 2016-04-21 DIAGNOSIS — I110 Hypertensive heart disease with heart failure: Secondary | ICD-10-CM | POA: Diagnosis not present

## 2016-04-21 MED ORDER — OXYCODONE-ACETAMINOPHEN 5-325 MG PO TABS: 1.0000 | ORAL_TABLET | ORAL | 0 refills | Status: DC | PRN
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
## 2016-04-21 NOTE — Discharge Instructions (Signed)
Please seek medical attention for any high fevers, chest pain, shortness of breath, change in behavior, persistent vomiting, bloody stool or any other new or concerning symptoms.  

## 2016-05-16 ENCOUNTER — Encounter: Payer: Self-pay | Admitting: *Deleted

## 2016-05-17 ENCOUNTER — Encounter
Admission: RE | Disposition: A | Discharge status: Home / Self Care | Payer: Self-pay | Source: Ambulatory Visit | Attending: Gastroenterology

## 2016-05-17 ENCOUNTER — Ambulatory Visit: Payer: Medicare PPO | Admitting: Anesthesiology

## 2016-05-17 ENCOUNTER — Ambulatory Visit
Admission: RE | Admit: 2016-05-17 | Discharge: 2016-05-17 | Disposition: A | Discharge status: Home / Self Care | Payer: Medicare PPO | Source: Ambulatory Visit | Attending: Gastroenterology | Admitting: Gastroenterology

## 2016-05-17 ENCOUNTER — Encounter: Payer: Self-pay | Admitting: *Deleted

## 2016-05-17 DIAGNOSIS — G4733 Obstructive sleep apnea (adult) (pediatric): Secondary | ICD-10-CM | POA: Insufficient documentation

## 2016-05-17 DIAGNOSIS — E1140 Type 2 diabetes mellitus with diabetic neuropathy, unspecified: Secondary | ICD-10-CM | POA: Diagnosis not present

## 2016-05-17 DIAGNOSIS — K621 Rectal polyp: Secondary | ICD-10-CM | POA: Insufficient documentation

## 2016-05-17 DIAGNOSIS — I5020 Unspecified systolic (congestive) heart failure: Secondary | ICD-10-CM | POA: Insufficient documentation

## 2016-05-17 DIAGNOSIS — Z7982 Long term (current) use of aspirin: Secondary | ICD-10-CM | POA: Insufficient documentation

## 2016-05-17 DIAGNOSIS — Q439 Congenital malformation of intestine, unspecified: Secondary | ICD-10-CM | POA: Diagnosis not present

## 2016-05-17 DIAGNOSIS — E1151 Type 2 diabetes mellitus with diabetic peripheral angiopathy without gangrene: Secondary | ICD-10-CM | POA: Diagnosis not present

## 2016-05-17 DIAGNOSIS — J449 Chronic obstructive pulmonary disease, unspecified: Secondary | ICD-10-CM | POA: Insufficient documentation

## 2016-05-17 DIAGNOSIS — Z794 Long term (current) use of insulin: Secondary | ICD-10-CM | POA: Insufficient documentation

## 2016-05-17 DIAGNOSIS — Z1211 Encounter for screening for malignant neoplasm of colon: Secondary | ICD-10-CM | POA: Insufficient documentation

## 2016-05-17 DIAGNOSIS — Z9981 Dependence on supplemental oxygen: Secondary | ICD-10-CM | POA: Insufficient documentation

## 2016-05-17 DIAGNOSIS — I110 Hypertensive heart disease with heart failure: Secondary | ICD-10-CM | POA: Insufficient documentation

## 2016-05-17 DIAGNOSIS — J9611 Chronic respiratory failure with hypoxia: Secondary | ICD-10-CM | POA: Insufficient documentation

## 2016-05-17 DIAGNOSIS — D120 Benign neoplasm of cecum: Secondary | ICD-10-CM | POA: Insufficient documentation

## 2016-05-17 DIAGNOSIS — Z6841 Body Mass Index (BMI) 40.0 and over, adult: Secondary | ICD-10-CM | POA: Diagnosis not present

## 2016-05-17 DIAGNOSIS — Z79899 Other long term (current) drug therapy: Secondary | ICD-10-CM | POA: Diagnosis not present

## 2016-05-17 HISTORY — PX: COLONOSCOPY WITH PROPOFOL: SHX5780

## 2016-05-17 LAB — GLUCOSE, CAPILLARY: Glucose-Capillary: 166 mg/dL — ABNORMAL HIGH (ref 65–99)

## 2016-05-17 SURGERY — COLONOSCOPY WITH PROPOFOL
Anesthesia: General

## 2016-05-17 MED ORDER — PROPOFOL 10 MG/ML IV BOLUS
INTRAVENOUS | Status: DC | PRN
  Administered 2016-05-17: 10 mg via INTRAVENOUS
  Administered 2016-05-17: 90 mg via INTRAVENOUS

## 2016-05-17 MED ORDER — PROPOFOL 500 MG/50ML IV EMUL
INTRAVENOUS | Status: AC
  Filled 2016-05-17: qty 50

## 2016-05-17 MED ORDER — SODIUM CHLORIDE 0.9 % IV SOLN
INTRAVENOUS | Status: DC
  Administered 2016-05-17: 1000 mL via INTRAVENOUS

## 2016-05-17 MED ORDER — PROPOFOL 500 MG/50ML IV EMUL
INTRAVENOUS | Status: DC | PRN
  Administered 2016-05-17: 100 ug/kg/min via INTRAVENOUS

## 2016-05-17 MED ORDER — SODIUM CHLORIDE 0.9 % IV SOLN: INTRAVENOUS | Status: DC

## 2016-05-17 NOTE — Anesthesia Post-op Follow-up Note (Cosign Needed)
Anesthesia QCDR form completed.        

## 2016-05-17 NOTE — Op Note (Signed)
[facility] Patient Name: Raymond [name] Procedure Date: 05/17/2016 8:46 AM MRN: [id number] Account #: [id number] Date of Birth: [birth year] Admit Type: Outpatient Age: 51 Room: [facility] ENDO ROOM 1 Gender: Male Note Status: Finalized Procedure:            Colonoscopy Indications:          Screening for colorectal malignant neoplasm Providers:            Lollie Sails, MD Referring MD:         Tonsina, MD (Referring MD) Medicines:            Monitored Anesthesia Care Complications:        No immediate complications. Procedure:            Pre-Anesthesia Assessment:                       - ASA Grade Assessment: IV - A patient with severe                        systemic disease that is a constant threat to life.                       After obtaining informed consent, the colonoscope was                        passed under direct vision. Throughout the procedure,                        the patient's blood pressure, pulse, and oxygen                        saturations were monitored continuously. The                        Colonoscope was introduced through the anus and                        advanced to the the cecum, identified by appendiceal                        orifice and ileocecal valve. The colonoscopy was                        unusually difficult due to poor bowel prep and a                        tortuous colon. Successful completion of the procedure                        was aided by lavage. The patient tolerated the                        procedure well. The quality of the bowel preparation                        was fair to poor. Findings:      A 2 mm polyp was found in the cecum. The polyp was sessile. The polyp       was removed with a cold biopsy forceps. Resection and retrieval  were       complete.      A 3 mm polyp was found in the rectum. The polyp was sessile. The polyp       was removed with a  cold biopsy forceps. Resection and retrieval were       complete.      The exam was otherwise without abnormality.      The retroflexed view of the distal rectum and anal verge was normal and       showed no anal or rectal abnormalities.      The digital rectal exam was normal. Impression:           - Preparation of the colon was fair.                       - One 2 mm polyp in the cecum, removed with a cold                        biopsy forceps. Resected and retrieved.                       - One 3 mm polyp in the rectum, removed with a cold                        biopsy forceps. Resected and retrieved.                       - The examination was otherwise normal.                       - The distal rectum and anal verge are normal on                        retroflexion view. Recommendation:       - Repeat colonoscopy in 3 years because the bowel                        preparation was poor. Procedure Code(s):    --- Professional ---                       [phone], Colonoscopy, flexible; with biopsy, single or                        multiple Diagnosis Code(s):    --- Professional ---                       Z12.11, Encounter for screening for malignant neoplasm                        of colon                       D12.0, Benign neoplasm of cecum                       K62.1, Rectal polyp CPT copyright 2016 American Medical Association. All rights reserved. The codes documented in this report are preliminary and upon coder review may  be revised to meet current compliance requirements. Lollie Sails, MD 05/17/2016 9:34:38 AM This report has been signed electronically. Number of Addenda: 0 Note Initiated On: 05/17/2016  8:46 AM Scope Withdrawal Time: 0 hours 9 minutes 56 seconds  Total Procedure Duration: 0 hours 21 minutes 17 seconds       [facility]

## 2016-05-17 NOTE — Anesthesia Preprocedure Evaluation (Signed)
Anesthesia Evaluation  Patient identified by MRN, date of birth, ID band Patient awake    Reviewed: Allergy & Precautions, H&P , NPO status , Patient's Chart, lab work & pertinent test results, reviewed documented beta blocker date and time   Airway Mallampati: III   Neck ROM: full    Dental  (+) Poor Dentition, Teeth Intact   Pulmonary neg pulmonary ROS, shortness of breath and with exertion, sleep apnea and Continuous Positive Airway Pressure Ventilation , COPD,  COPD inhaler,    Pulmonary exam normal        Cardiovascular Exercise Tolerance: Poor hypertension, On Medications (-) angina+ Peripheral Vascular Disease, +CHF, + Orthopnea and + DOE  (-) PND negative cardio ROS Normal cardiovascular exam Rhythm:regular Rate:Normal     Neuro/Psych negative neurological ROS  negative psych ROS   GI/Hepatic negative GI ROS, Neg liver ROS, Medicated,  Endo/Other  negative endocrine ROSdiabetesMorbid obesity  Renal/GU negative Renal ROS  negative genitourinary   Musculoskeletal   Abdominal   Peds  Hematology negative hematology ROS (+)   Anesthesia Other Findings Past Medical History: No date: Arthritis     Comment: hands No date: Cellulitis No date: CHF (congestive heart failure) (HCC) No date: Chronic respiratory failure with hypoxia (HCC)     Comment: 2 L [place] continuously No date: Diabetes (HCC) No date: Edema     Comment: FEET/LEGS No date: GERD (gastroesophageal reflux disease) No date: Hepatic steatosis No date: HOH (hard of hearing) No date: Hypertension No date: Morbid obesity ([place]) No date: Neuropathy No date: Orthopnea No date: OSA on CPAP     Comment: TRILOGY VENTILATOR No date: Oxygen deficiency     Comment: 2L  HS No date: PVD (peripheral vascular disease) (HCC) No date: Shortness of breath dyspnea No date: Systolic heart failure (HCC)     Comment: Preserved EF 50-55% Past Surgical  History: 10/02/2014: AMPUTATION Right     Comment: Procedure: AMPUTATION RAY;  Surgeon: Albertine Patricia, DPM;  Location: [facility] ORS;  Service:               Podiatry;  Laterality: Right; 09/10/2015: CATARACT EXTRACTION W/PHACO Right     Comment: Procedure: CATARACT EXTRACTION PHACO AND               INTRAOCULAR LENS PLACEMENT (IOC);  Surgeon:               Birder Robson, MD;  Location: [facility] ORS;                Service: Ophthalmology;  Laterality: Right;  [place]              01:34 AP% 19.3 CDE 18.36 Fluid pack lot #               [id number] 10/06/2015: CATARACT EXTRACTION W/PHACO Left     Comment: Procedure: CATARACT EXTRACTION PHACO AND               INTRAOCULAR LENS PLACEMENT (IOC);  Surgeon:               Birder Robson, MD;  Location: [facility] ORS;                Service: Ophthalmology;  Laterality: Left;  [place]               00:56 AP% 19.5 CDE 11.02 Fluid Pack #  [id number] No date: CHOLECYSTECTOMY No date: EYE SURGERY Left     Comment: bilat laser No date: HIP SURGERY Right     Comment: 8 th grade pins No date: NOSE SURGERY 10/02/2014: PERIPHERAL VASCULAR CATHETERIZATION Right     Comment: Procedure: Lower Extremity Angiography;                Surgeon: Algernon Huxley, MD;  Location: [place] [facility];  Service: Cardiovascular;                Laterality: Right; 2010: TOE AMPUTATION Left     Comment: 2cd 2009: TOE AMPUTATION Right BMI    Body Mass Index:  48.28 kg/m     Reproductive/Obstetrics negative OB ROS                             Anesthesia Physical Anesthesia Plan  ASA: IV  Anesthesia Plan: General   Post-op Pain Management:    Induction:   Airway Management Planned:   Additional Equipment:   Intra-op Plan:   Post-operative Plan:   Informed Consent: I have reviewed the patients History and Physical, chart, labs and discussed the procedure including the risks, benefits and alternatives for the  proposed anesthesia with the patient or authorized representative who has indicated his/her understanding and acceptance.   Dental Advisory Given  Plan Discussed with: CRNA  Anesthesia Plan Comments:         Anesthesia Quick Evaluation

## 2016-05-17 NOTE — H&P (Signed)
Outpatient short stay form Pre-procedure 05/17/2016 8:56 AM Lollie Sails MD  Primary Physician: Dr. Frederico Hamman  Reason for visit:  Colonoscopy  History of present illness:  Patient is a 51 year old male presenting today as above. He tolerated his prep    Current Facility-Administered Medications:  .  0.9 %  sodium chloride infusion, , Intravenous, Continuous, Lollie Sails, MD, Last Rate: 20 mL/hr at 05/17/16 0819, 1,000 mL at 05/17/16 0819 .  0.9 %  sodium chloride infusion, , Intravenous, Continuous, Lollie Sails, MD  Prescriptions Prior to Admission  Medication Sig Dispense Refill Last Dose  . aspirin EC 81 MG tablet Take 81 mg by mouth daily.   Past Week at Unknown time  . carvedilol (COREG) 12.5 MG tablet Take 12.5 mg by mouth 2 (two) times daily.    Past Week at Unknown time  . enalapril (VASOTEC) 20 MG tablet Take 40 mg by mouth daily.    Past Week at Unknown time  . furosemide (LASIX) 40 MG tablet Take 1 tablet (40 mg total) by mouth 2 (two) times daily as needed. (Patient taking differently: Take 40 mg by mouth 2 (two) times daily as needed for edema. ) 60 tablet 11 Past Week at Unknown time  . gabapentin (NEURONTIN) 300 MG capsule Take 300 mg by mouth 3 (three) times daily.   Past Week at Unknown time  . metFORMIN (GLUCOPHAGE) 1000 MG tablet Take 1,000 mg by mouth 2 (two) times daily with a meal.   Past Week at Unknown time  . oxyCODONE-acetaminophen (ROXICET) 5-325 MG tablet Take 1 tablet by mouth every 4 (four) hours as needed for severe pain. 12 tablet 0 Past Week at Unknown time  . pantoprazole (PROTONIX) 40 MG tablet Take 40 mg by mouth daily.   Past Week at Unknown time  . potassium chloride (MICRO-K) 10 MEQ CR capsule Take 10 mEq by mouth 2 (two) times daily.   Past Week at Unknown time  . amLODipine (NORVASC) 10 MG tablet Take 10 mg by mouth daily.   Taking  . insulin detemir (LEVEMIR) 100 UNIT/ML injection Inject 75 Units into the skin 2 (two) times daily.      at 0630  . insulin lispro (HUMALOG) 100 UNIT/ML cartridge Inject 60 Units into the skin 3 (three) times daily with meals.    at 0630     No Known Allergies   Past Medical History:  Diagnosis Date  . Arthritis    hands  . Cellulitis   . CHF (congestive heart failure) ([place])   . Chronic respiratory failure with hypoxia (HCC)    2 L [place] continuously  . Diabetes ([place])   . Edema    FEET/LEGS  . GERD (gastroesophageal reflux disease)   . Hepatic steatosis   . HOH (hard of hearing)   . Hypertension   . Morbid obesity ([place])   . Neuropathy   . Orthopnea   . OSA on CPAP    TRILOGY VENTILATOR  . Oxygen deficiency    2L  HS  . PVD (peripheral vascular disease) ([place])   . Shortness of breath dyspnea   . Systolic heart failure (HCC)    Preserved EF 50-55%    Review of systems:      Physical Exam    Heart and lungs: Regular rate and rhythm without rub or gallop, lungs are bilaterally clear.    HEENT: Normocephalic atraumatic eyes are anicteric    Other:     Pertinant exam for procedure:  Soft nontender nondistended bowel sounds positive normoactive.    Planned proceedures: Colonoscopy and indicated procedures. I have discussed the risks benefits and complications of procedures to include not limited to bleeding, infection, perforation and the risk of sedation and the patient wishes to proceed.    Lollie Sails, MD Gastroenterology 05/17/2016  8:56 AM

## 2016-05-17 NOTE — Transfer of Care (Signed)
Immediate Anesthesia Transfer of Care Note  Patient: Raymond [name]  Procedure(s) Performed: Procedure(s): COLONOSCOPY WITH PROPOFOL (N/A)  Patient Location: PACU and Endoscopy Unit  Anesthesia Type:General  Level of Consciousness: patient cooperative and lethargic  Airway & Oxygen Therapy: Patient Spontanous Breathing, Patient connected to nasal cannula oxygen and Patient connected to face mask  Post-op Assessment: Report given to RN and Post -op Vital signs reviewed and stable  Post vital signs: Reviewed and stable  Last Vitals:  Vitals:   05/17/16 0753 05/17/16 0932  BP: (!) 182/75 138/70  Pulse: 73 62  Resp: 20 (!) 25  Temp: 37 C 36.6 C    Last Pain:  Vitals:   05/17/16 0932  TempSrc: Tympanic         Complications: No apparent anesthesia complications

## 2016-05-18 ENCOUNTER — Encounter: Payer: Self-pay | Admitting: Gastroenterology

## 2016-05-18 LAB — SURGICAL PATHOLOGY

## 2016-05-30 NOTE — Anesthesia Postprocedure Evaluation (Signed)
Anesthesia Post Note  Patient: Raymond [name]  Procedure(s) Performed: Procedure(s) (LRB): COLONOSCOPY WITH PROPOFOL (N/A)  Patient location during evaluation: PACU Anesthesia Type: General Level of consciousness: awake and alert Pain management: pain level controlled Vital Signs Assessment: post-procedure vital signs reviewed and stable Respiratory status: spontaneous breathing, nonlabored ventilation, respiratory function stable and patient connected to nasal cannula oxygen Cardiovascular status: blood pressure returned to baseline and stable Postop Assessment: no signs of nausea or vomiting Anesthetic complications: no     Last Vitals:  Vitals:   05/17/16 0952 05/17/16 1002  BP: (!) 144/76 (!) 147/79  Pulse: 64 64  Resp: 15 17  Temp:      Last Pain:  Vitals:   05/18/16 0743  TempSrc:   PainSc: 0-No pain                 Molli Barrows

## 2016-11-21 ENCOUNTER — Encounter: Payer: Self-pay | Admitting: Emergency Medicine

## 2016-11-21 ENCOUNTER — Emergency Department
Admission: EM | Admit: 2016-11-21 | Discharge: 2016-11-22 | Disposition: A | Discharge status: Home / Self Care | Payer: Medicare PPO | Attending: Emergency Medicine | Admitting: Emergency Medicine

## 2016-11-21 DIAGNOSIS — Z7984 Long term (current) use of oral hypoglycemic drugs: Secondary | ICD-10-CM | POA: Diagnosis not present

## 2016-11-21 DIAGNOSIS — L0291 Cutaneous abscess, unspecified: Secondary | ICD-10-CM

## 2016-11-21 DIAGNOSIS — Z79899 Other long term (current) drug therapy: Secondary | ICD-10-CM | POA: Insufficient documentation

## 2016-11-21 DIAGNOSIS — E119 Type 2 diabetes mellitus without complications: Secondary | ICD-10-CM | POA: Diagnosis not present

## 2016-11-21 DIAGNOSIS — L0211 Cutaneous abscess of neck: Secondary | ICD-10-CM | POA: Diagnosis not present

## 2016-11-21 DIAGNOSIS — Z7982 Long term (current) use of aspirin: Secondary | ICD-10-CM | POA: Diagnosis not present

## 2016-11-21 DIAGNOSIS — I110 Hypertensive heart disease with heart failure: Secondary | ICD-10-CM | POA: Insufficient documentation

## 2016-11-21 DIAGNOSIS — J449 Chronic obstructive pulmonary disease, unspecified: Secondary | ICD-10-CM | POA: Diagnosis not present

## 2016-11-21 DIAGNOSIS — I5022 Chronic systolic (congestive) heart failure: Secondary | ICD-10-CM | POA: Diagnosis not present

## 2016-11-21 DIAGNOSIS — R221 Localized swelling, mass and lump, neck: Secondary | ICD-10-CM | POA: Diagnosis present

## 2016-11-21 LAB — COMPREHENSIVE METABOLIC PANEL
ALT: 18 U/L (ref 17–63)
AST: 14 U/L — ABNORMAL LOW (ref 15–41)
Albumin: 3.9 g/dL (ref 3.5–5.0)
Alkaline Phosphatase: 81 U/L (ref 38–126)
Anion gap: 10 (ref 5–15)
BUN: 14 mg/dL (ref 6–20)
CO2: 26 mmol/L (ref 22–32)
Calcium: 9.1 mg/dL (ref 8.9–10.3)
Chloride: 104 mmol/L (ref 101–111)
Creatinine, Ser: 1.09 mg/dL (ref 0.61–1.24)
GFR calc Af Amer: >60 mL/min (ref >60)
GFR calc non Af Amer: >60 mL/min (ref >60)
Glucose, Bld: 170 mg/dL — ABNORMAL HIGH (ref 65–99)
Potassium: 4.2 mmol/L (ref 3.5–5.1)
Sodium: 140 mmol/L (ref 135–145)
Total Bilirubin: 0.7 mg/dL (ref 0.3–1.2)
Total Protein: 7.9 g/dL (ref 6.5–8.1)

## 2016-11-21 LAB — CBC
HCT: 36.7 % — ABNORMAL LOW (ref 40.0–52.0)
Hemoglobin: 12 g/dL — ABNORMAL LOW (ref 13.0–18.0)
MCH: 27.1 pg (ref 26.0–34.0)
MCHC: 32.7 g/dL (ref 32.0–36.0)
MCV: 82.9 fL (ref 80.0–100.0)
Platelets: 337 10*3/uL (ref 150–440)
RBC: 4.42 MIL/uL (ref 4.40–5.90)
RDW: 16.1 % — ABNORMAL HIGH (ref 11.5–14.5)
WBC: 10 10*3/uL (ref 3.8–10.6)

## 2016-11-21 MED ORDER — LIDOCAINE HCL (PF) 1 % IJ SOLN
5.0000 mL | Freq: Once | INTRAMUSCULAR | Status: DC
  Filled 2016-11-21: qty 5

## 2016-11-21 MED ORDER — SULFAMETHOXAZOLE-TRIMETHOPRIM 800-160 MG PO TABS: 1.0000 | ORAL_TABLET | Freq: Two times a day (BID) | ORAL | 0 refills | Status: DC

## 2016-11-21 NOTE — ED Notes (Signed)
Pt has an insect bite to right side of neck   Occurred 2 days ago.  No itching,  no resp distress.  Pt states a lot of saliva in mouth since bite.  No diff talking.  No earache.  Pt alert.  Speech clear.

## 2016-11-21 NOTE — Discharge Instructions (Signed)
Please seek medical attention for any high fevers, chest pain, shortness of breath, change in behavior, persistent vomiting, bloody stool or any other new or concerning symptoms.  

## 2016-11-21 NOTE — ED Triage Notes (Signed)
Pt reports insect bite To right side of neck reports some swelling pt reports today he has been feeling "weird" when he brushes his teeth unable to spit out, Pt reports able to drink water but he loss his appetite today, Pt talks in complete sentences no respiratory distress noted

## 2016-11-21 NOTE — ED Notes (Signed)
This Rn spoke with Dr. Corky Downs about pt's presentation received orders to get blood work CBC, CMP

## 2016-11-22 NOTE — ED Provider Notes (Signed)
[facility] Emergency Department Provider Note   ____________________________________________   I have reviewed the triage vital signs and the nursing notes.   HISTORY  Chief Complaint Insect Bite and Dysphagia   History limited by: Not Limited   HPI Raymond [name] is a 51 y.o. male who presents to the emergency department today because of concerns for swelling pain to the neck.   LOCATION:right nape DURATION:2-3 days TIMING: constant, worsening CONTEXT: patient thinks he might have been bitten by an insect. Then noticed swelling and pain. States he has had abscess in the past and this reminds him of that. MODIFYING FACTORS: worse with movement and touch ASSOCIATED SYMPTOMS: increased salvation. No fevers  Per medical record review patient has a history of diabetes.  Past Medical History:  Diagnosis Date  . Arthritis    hands  . Cellulitis   . CHF (congestive heart failure) ([place])   . Chronic respiratory failure with hypoxia (HCC)    2 L [place] continuously  . Diabetes ([place])   . Edema    FEET/LEGS  . GERD (gastroesophageal reflux disease)   . Hepatic steatosis   . HOH (hard of hearing)   . Hypertension   . Morbid obesity ([place])   . Neuropathy   . Orthopnea   . OSA on CPAP    TRILOGY VENTILATOR  . Oxygen deficiency    2L  HS  . PVD (peripheral vascular disease) ([place])   . Shortness of breath dyspnea   . Systolic heart failure (HCC)    Preserved EF 50-55%    Patient Active Problem List   Diagnosis Date Noted  . Cellulitis of right foot 09/30/2014  . COPD, moderate ([place]) 07/07/2014  . Chronic diastolic heart failure ([place]) 05/06/2014  . Dyspnea 04/28/2014  . OSA treated with Trilogy machine 04/28/2014  . Hypoxemia requiring supplemental oxygen 04/28/2014  . Morbid obesity ([place]) 04/28/2014    Past Surgical History:  Procedure Laterality Date  . AMPUTATION Right 10/02/2014   Procedure: AMPUTATION RAY;  Surgeon: Albertine Patricia, DPM;  Location: [facility] ORS;  Service: Podiatry;  Laterality: Right;  . CATARACT EXTRACTION W/PHACO Right 09/10/2015   Procedure: CATARACT EXTRACTION PHACO AND INTRAOCULAR LENS PLACEMENT (IOC);  Surgeon: Birder Robson, MD;  Location: [facility] ORS;  Service: Ophthalmology;  Laterality: Right;  [place] 01:34 AP% 19.3 CDE 18.36 Fluid pack lot # [id number]  . CATARACT EXTRACTION W/PHACO Left 10/06/2015   Procedure: CATARACT EXTRACTION PHACO AND INTRAOCULAR LENS PLACEMENT (IOC);  Surgeon: Birder Robson, MD;  Location: [facility] ORS;  Service: Ophthalmology;  Laterality: Left;  [place] 00:56 AP% 19.5 CDE 11.02 Fluid Pack # [id number]  . CHOLECYSTECTOMY    . COLONOSCOPY WITH PROPOFOL N/A 05/17/2016   Procedure: COLONOSCOPY WITH PROPOFOL;  Surgeon: Lollie Sails, MD;  Location: [facility] ENDOSCOPY;  Service: Endoscopy;  Laterality: N/A;  . EYE SURGERY Left    bilat laser  . HIP SURGERY Right    8 th grade pins  . NOSE SURGERY    . PERIPHERAL VASCULAR CATHETERIZATION Right 10/02/2014   Procedure: Lower Extremity Angiography;  Surgeon: Algernon Huxley, MD;  Location: [facility] CV LAB;  Service: Cardiovascular;  Laterality: Right;  . TOE AMPUTATION Left 2010   2cd  . TOE AMPUTATION Right 2009    Prior to Admission medications   Medication Sig Start Date End Date Taking? Authorizing Provider  amLODipine (NORVASC) 10 MG tablet Take 10 mg by mouth daily.    [provider]  aspirin EC 81 MG  tablet Take 81 mg by mouth daily.    [provider]  carvedilol (COREG) 12.5 MG tablet Take 12.5 mg by mouth 2 (two) times daily.     [provider]  enalapril (VASOTEC) 20 MG tablet Take 40 mg by mouth daily.     [provider]  furosemide (LASIX) 40 MG tablet Take 1 tablet (40 mg total) by mouth 2 (two) times daily as needed. Patient taking differently: Take 40 mg by mouth 2 (two) times daily as needed for edema.  05/06/14   Minna Merritts, MD  gabapentin (NEURONTIN) 300 MG capsule Take  300 mg by mouth 3 (three) times daily.    [provider]  insulin detemir (LEVEMIR) 100 UNIT/ML injection Inject 75 Units into the skin 2 (two) times daily.     [provider]  insulin lispro (HUMALOG) 100 UNIT/ML cartridge Inject 60 Units into the skin 3 (three) times daily with meals.    [provider]  metFORMIN (GLUCOPHAGE) 1000 MG tablet Take 1,000 mg by mouth 2 (two) times daily with a meal.    [provider]  oxyCODONE-acetaminophen (ROXICET) 5-325 MG tablet Take 1 tablet by mouth every 4 (four) hours as needed for severe pain. 04/21/16   Nance Pear, MD  pantoprazole (PROTONIX) 40 MG tablet Take 40 mg by mouth daily.    [provider]  potassium chloride (MICRO-K) 10 MEQ CR capsule Take 10 mEq by mouth 2 (two) times daily.    [provider]  sulfamethoxazole-trimethoprim (BACTRIM DS,SEPTRA DS) 800-160 MG tablet Take 1 tablet by mouth 2 (two) times daily. 11/21/16   Nance Pear, MD    Allergies Patient has no known allergies.  Family History  Problem Relation Age of Onset  . Hypertension Mother   . Diabetes Mother   . Stroke Father   . Hypertension Father   . Heart attack Father     Social History Social History  Substance Use Topics  . Smoking status: Never Smoker  . Smokeless tobacco: Never Used  . Alcohol use No    Review of Systems Constitutional: No fever/chills Eyes: No visual changes. ENT: No sore throat. Cardiovascular: Denies chest pain. Respiratory: Denies shortness of breath. Gastrointestinal: No abdominal pain.  No nausea, no vomiting.  No diarrhea.   Genitourinary: Negative for dysuria. Musculoskeletal: Negative for back pain. Skin: Positive and swelling to right neck. Neurological: Negative for headaches, focal weakness or numbness.  ____________________________________________   PHYSICAL EXAM:  VITAL SIGNS: ED Triage Vitals  Enc Vitals Group     BP 11/21/16 1956 (!) 160/66      Pulse Rate 11/21/16 1956 72     Resp 11/21/16 1956 18     Temp 11/21/16 1956 99.4 F (37.4 C)     Temp Source 11/21/16 1956 Oral     SpO2 11/21/16 1956 98 %     Weight 11/21/16 1955 (!) 372 lb (168.7 kg)     Height 11/21/16 1955 6\' 2"  (1.88 m)     Head Circumference --      Peak Flow --      Pain Score 11/22/16 0005 8    Constitutional: Alert and oriented. Well appearing and in no distress. Eyes: Conjunctivae are normal.  ENT   Head: Normocephalic and atraumatic.   Nose: No congestion/rhinnorhea.   Mouth/Throat: Mucous membranes are moist.   Neck: No stridor. Swelling to right nape. Hematological/Lymphatic/Immunilogical: No cervical lymphadenopathy. Cardiovascular: Normal rate, regular rhythm.  No murmurs, rubs, or gallops.  Respiratory: Normal respiratory effort without tachypnea nor retractions. Breath sounds are clear and equal bilaterally. No wheezes/rales/rhonchi. Gastrointestinal: Soft and non tender. No rebound. No guarding.  Genitourinary: Deferred Musculoskeletal: Normal range of motion in all extremities. No lower extremity edema. Neurologic:  Normal speech and language. No gross focal neurologic deficits are appreciated.  Skin:  Skin is warm, dry and intact. Swelling noted to the back of the right neck. Some warmth and fluctuance. Psychiatric: Mood and affect are normal. Speech and behavior are normal. Patient exhibits appropriate insight and judgment.  ____________________________________________    LABS (pertinent positives/negatives)  CBC wbc 10, hgb 12 CMP glu 170  ____________________________________________   EKG  None  ____________________________________________    RADIOLOGY  None  ____________________________________________   PROCEDURES  Procedures  Incision and Drainage of Abcess Location: nape Anesthesia Local: 1% Lidocaine without Epi  Prep/Procedure: Skin Prep: Chlorahex Incised abscess with #11 blade Purulent  discharge: large Probed to break up loculations Packed with 1/4" gauze Estimated blood loss: 1 ml  ____________________________________________   INITIAL IMPRESSION / ASSESSMENT AND PLAN / ED COURSE  Pertinent labs & imaging results that were available during my care of the patient were reviewed by me and considered in my medical decision making (see chart for details).  patient presented to the emergency department today because of concerns for swelling and pain to the right neck. Exam is consistent with an abscess. This was incised and drained with a large amount of pus. No concerning leukocytosis to suggest more significant infection on blood work. Glucose was elevated at 170 just consistent with patient's history of diabetes. Did discuss plan to discharge patient with further antibiotics with the patient.   ____________________________________________   FINAL CLINICAL IMPRESSION(S) / ED DIAGNOSES  Final diagnoses:  Abscess     Note: This dictation was prepared with Dragon dictation. Any transcriptional errors that result from this process are unintentional     Nance Pear, MD 11/22/16 [phone]

## 2016-12-06 NOTE — Progress Notes (Signed)
* [facility]     Assessment and Plan:  OSA/OHS --Continue using trilogy vent device every night.  --Appears to be doing well, sleeping well overnight.   CHFpEF with volume overload.  --continue current management.    Date: 12/06/2016  MRN# [id number] Raymond [name] 1965-03-13   Raymond [name] is a 51 y.o. old male seen in follow up for chief complaint of  Chief Complaint  Patient presents with  . Sleep Apnea    pt uses Trilogy and says he is doing well.      HPI:   The patient is a 51 yo male with a history of COPD, OSA/OHS, CHF, volume overload. Patient states that he has a diagnosis of sleep apnea since 2010, was initially on CPAP, and 2013 had arepeat sleep study, was placed on BiPAP. She was apparently placed on a trilogy device after a recent hospitalization.  He notes that he is doing well, and he is sleeping well, he notes that his breathing is much better with this device. He uses it every night, he usually puts it on at 5 am, and wakes at 2-3 pm. He describes himself as a night owl.  He has an advair inhaler but uses still rarely because he does not feel that he needs it. He goes to a gym 3 to 5 days per week and has no trouble with dyspnea. He can ride a stationary for about an hour without trouble.   Split night study; 05/21/14; AHI 106.The patient is a 51 yo male with a history; required CPAP @ 14, did not achieve supine REM.    Medication:   Outpatient Encounter Medications as of 12/08/2016  Medication Sig  . amLODipine (NORVASC) 10 MG tablet Take 10 mg by mouth daily.  Marland Kitchen aspirin EC 81 MG tablet Take 81 mg by mouth daily.  . carvedilol (COREG) 12.5 MG tablet Take 12.5 mg by mouth 2 (two) times daily.   . enalapril (VASOTEC) 20 MG tablet Take 40 mg by mouth daily.   . furosemide (LASIX) 40 MG tablet Take 1 tablet (40 mg total) by mouth 2 (two) times daily as needed. (Patient taking differently: Take 40 mg by mouth 2 (two)  times daily as needed for edema. )  . gabapentin (NEURONTIN) 300 MG capsule Take 300 mg by mouth 3 (three) times daily.  . insulin detemir (LEVEMIR) 100 UNIT/ML injection Inject 75 Units into the skin 2 (two) times daily.   . insulin lispro (HUMALOG) 100 UNIT/ML cartridge Inject 60 Units into the skin 3 (three) times daily with meals.  . metFORMIN (GLUCOPHAGE) 1000 MG tablet Take 1,000 mg by mouth 2 (two) times daily with a meal.  . oxyCODONE-acetaminophen (ROXICET) 5-325 MG tablet Take 1 tablet by mouth every 4 (four) hours as needed for severe pain.  . pantoprazole (PROTONIX) 40 MG tablet Take 40 mg by mouth daily.  . potassium chloride (MICRO-K) 10 MEQ CR capsule Take 10 mEq by mouth 2 (two) times daily.  Marland Kitchen sulfamethoxazole-trimethoprim (BACTRIM DS,SEPTRA DS) 800-160 MG tablet Take 1 tablet by mouth 2 (two) times daily.   No facility-administered encounter medications on file as of 12/08/2016.      Allergies:  Patient has no known allergies.  Review of Systems: Gen:  Denies  fever, sweats. HEENT: Denies blurred vision. Cvc:  No dizziness, chest pain or heaviness Resp:   Denies cough or sputum porduction. Gi: Denies swallowing difficulty, stomach pain. constipation, bowel incontinence Gu:  Denies bladder incontinence,  burning urine Ext:   No Joint pain, stiffness. Skin: No skin rash, easy bruising. Endoc:  No polyuria, polydipsia. Psych: No depression, insomnia. Other:  All other systems were reviewed and found to be negative other than what is mentioned in the HPI.   Physical Examination:   VS: BP (!) 152/78 (BP Location: Left Arm, Cuff Size: Large)   Pulse 80   Resp 16   Ht 6\' 2"  (1.88 m)   Wt (!) 391 lb (177.4 kg)   SpO2 94%   BMI 50.20 kg/m   General Appearance: No distress  Neuro:without focal findings,  speech normal,  HEENT: PERRLA, EOM intact. Pulmonary: normal breath sounds, No wheezing.   CardiovascularNormal S1,S2.  No m/r/g.   Abdomen: Benign, Soft,  non-tender. Renal:  No costovertebral tenderness  GU:  Not performed at this time. Endoc: No evident thyromegaly, no signs of acromegaly. Skin:   warm, no rash. Extremities: normal, no cyanosis, clubbing.   LABORATORY PANEL:   CBC No results for input(s): WBC, HGB, HCT, PLT in the last 168 hours. ------------------------------------------------------------------------------------------------------------------  Chemistries  No results for input(s): NA, K, CL, CO2, GLUCOSE, BUN, CREATININE, CALCIUM, MG, AST, ALT, ALKPHOS, BILITOT in the last 168 hours.  Invalid input(s): GFRCGP ------------------------------------------------------------------------------------------------------------------  Cardiac Enzymes No results for input(s): TROPONINI in the last 168 hours. ------------------------------------------------------------  RADIOLOGY:   No results found for this or any previous visit. Results for orders placed in visit on 03/26/10  DG Chest 2 View   Narrative * PRIOR REPORT IMPORTED FROM AN EXTERNAL SYSTEM*   PRIOR REPORT IMPORTED FROM THE [employer] WORKFLOW SYSTEM   REASON FOR EXAM:    fever; elevated wbc  COMMENTS:   LMP: (Male)   PROCEDURE:     DXR - DXR CHEST PA (OR AP) AND LATERAL  - Mar 26 2010  10:40PM   RESULT:     Comparison is made to prior study dated 03/23/2009.   The patient has taken a shallow inspiration. Areas of increased density  project within the lung bases. These findings may represent vascular  crowding versus atelectasis or possibly bibasilar infiltrates. The cardiac  silhouette is moderately enlarged and the visualized bony skeleton is  unremarkable.   IMPRESSION:   1. Bibasilar densities as described above.   Thank you for the opportunity to contribute to the care of your patient.       ------------------------------------------------------------------------------------------------------------------  Thank  you for allowing [facility]  [facility], [facility] Care to assist in the care of your patient. Our recommendations are noted above.  Please contact us if we can be of further service.   Marda Stalker, MD.  [facility] Office Number: [phone]  Patricia Pesa, M.D.  Merton Border, M.D  12/06/2016

## 2016-12-08 ENCOUNTER — Ambulatory Visit (INDEPENDENT_AMBULATORY_CARE_PROVIDER_SITE_OTHER): Payer: Medicare PPO | Admitting: Internal Medicine

## 2016-12-08 ENCOUNTER — Encounter: Payer: Self-pay | Admitting: Internal Medicine

## 2016-12-08 DIAGNOSIS — E662 Morbid (severe) obesity with alveolar hypoventilation: Secondary | ICD-10-CM | POA: Diagnosis not present

## 2016-12-08 DIAGNOSIS — G4733 Obstructive sleep apnea (adult) (pediatric): Secondary | ICD-10-CM | POA: Diagnosis not present

## 2016-12-08 NOTE — Patient Instructions (Signed)
--  Continue to use your sleep machine every night.   --Continue going to the gym for exercise, weight loss would be beneficial for your health.

## 2016-12-13 ENCOUNTER — Encounter: Payer: Self-pay | Admitting: Internal Medicine

## 2017-01-13 ENCOUNTER — Encounter: Payer: Self-pay | Admitting: Emergency Medicine

## 2017-01-13 ENCOUNTER — Other Ambulatory Visit: Payer: Self-pay

## 2017-01-13 ENCOUNTER — Emergency Department: Payer: Medicare PPO

## 2017-01-13 ENCOUNTER — Inpatient Hospital Stay
Admission: EM | Admit: 2017-01-13 | Discharge: 2017-01-20 | DRG: 193 | Disposition: A | Discharge status: Home / Self Care | Payer: Medicare PPO | Attending: Internal Medicine | Admitting: Internal Medicine

## 2017-01-13 DIAGNOSIS — M1990 Unspecified osteoarthritis, unspecified site: Secondary | ICD-10-CM | POA: Diagnosis present

## 2017-01-13 DIAGNOSIS — E1140 Type 2 diabetes mellitus with diabetic neuropathy, unspecified: Secondary | ICD-10-CM | POA: Diagnosis present

## 2017-01-13 DIAGNOSIS — J189 Pneumonia, unspecified organism: Secondary | ICD-10-CM | POA: Diagnosis present

## 2017-01-13 DIAGNOSIS — J440 Chronic obstructive pulmonary disease with acute lower respiratory infection: Secondary | ICD-10-CM | POA: Diagnosis present

## 2017-01-13 DIAGNOSIS — K5900 Constipation, unspecified: Secondary | ICD-10-CM | POA: Diagnosis present

## 2017-01-13 DIAGNOSIS — E1165 Type 2 diabetes mellitus with hyperglycemia: Secondary | ICD-10-CM | POA: Diagnosis present

## 2017-01-13 DIAGNOSIS — I5043 Acute on chronic combined systolic (congestive) and diastolic (congestive) heart failure: Secondary | ICD-10-CM | POA: Diagnosis present

## 2017-01-13 DIAGNOSIS — Z961 Presence of intraocular lens: Secondary | ICD-10-CM | POA: Diagnosis present

## 2017-01-13 DIAGNOSIS — J984 Other disorders of lung: Secondary | ICD-10-CM | POA: Diagnosis present

## 2017-01-13 DIAGNOSIS — K760 Fatty (change of) liver, not elsewhere classified: Secondary | ICD-10-CM | POA: Diagnosis present

## 2017-01-13 DIAGNOSIS — E875 Hyperkalemia: Secondary | ICD-10-CM | POA: Diagnosis present

## 2017-01-13 DIAGNOSIS — N179 Acute kidney failure, unspecified: Secondary | ICD-10-CM | POA: Diagnosis present

## 2017-01-13 DIAGNOSIS — J9621 Acute and chronic respiratory failure with hypoxia: Secondary | ICD-10-CM | POA: Diagnosis present

## 2017-01-13 DIAGNOSIS — R0602 Shortness of breath: Secondary | ICD-10-CM | POA: Diagnosis present

## 2017-01-13 DIAGNOSIS — G4733 Obstructive sleep apnea (adult) (pediatric): Secondary | ICD-10-CM

## 2017-01-13 DIAGNOSIS — Z794 Long term (current) use of insulin: Secondary | ICD-10-CM

## 2017-01-13 DIAGNOSIS — Z9841 Cataract extraction status, right eye: Secondary | ICD-10-CM | POA: Diagnosis not present

## 2017-01-13 DIAGNOSIS — E860 Dehydration: Secondary | ICD-10-CM | POA: Diagnosis present

## 2017-01-13 DIAGNOSIS — Z79899 Other long term (current) drug therapy: Secondary | ICD-10-CM

## 2017-01-13 DIAGNOSIS — Z9981 Dependence on supplemental oxygen: Secondary | ICD-10-CM

## 2017-01-13 DIAGNOSIS — H9190 Unspecified hearing loss, unspecified ear: Secondary | ICD-10-CM | POA: Diagnosis present

## 2017-01-13 DIAGNOSIS — Z9842 Cataract extraction status, left eye: Secondary | ICD-10-CM

## 2017-01-13 DIAGNOSIS — K219 Gastro-esophageal reflux disease without esophagitis: Secondary | ICD-10-CM | POA: Diagnosis present

## 2017-01-13 DIAGNOSIS — E1151 Type 2 diabetes mellitus with diabetic peripheral angiopathy without gangrene: Secondary | ICD-10-CM | POA: Diagnosis present

## 2017-01-13 DIAGNOSIS — I5032 Chronic diastolic (congestive) heart failure: Secondary | ICD-10-CM

## 2017-01-13 DIAGNOSIS — Z6841 Body Mass Index (BMI) 40.0 and over, adult: Secondary | ICD-10-CM | POA: Diagnosis not present

## 2017-01-13 DIAGNOSIS — Z833 Family history of diabetes mellitus: Secondary | ICD-10-CM

## 2017-01-13 DIAGNOSIS — J181 Lobar pneumonia, unspecified organism: Secondary | ICD-10-CM | POA: Diagnosis not present

## 2017-01-13 DIAGNOSIS — E662 Morbid (severe) obesity with alveolar hypoventilation: Secondary | ICD-10-CM | POA: Diagnosis present

## 2017-01-13 DIAGNOSIS — R0902 Hypoxemia: Secondary | ICD-10-CM

## 2017-01-13 DIAGNOSIS — I110 Hypertensive heart disease with heart failure: Secondary | ICD-10-CM | POA: Diagnosis present

## 2017-01-13 DIAGNOSIS — Z7982 Long term (current) use of aspirin: Secondary | ICD-10-CM

## 2017-01-13 DIAGNOSIS — Z8249 Family history of ischemic heart disease and other diseases of the circulatory system: Secondary | ICD-10-CM

## 2017-01-13 DIAGNOSIS — R0600 Dyspnea, unspecified: Secondary | ICD-10-CM

## 2017-01-13 LAB — BASIC METABOLIC PANEL
Anion gap: 10 (ref 5–15)
BUN: 47 mg/dL — ABNORMAL HIGH (ref 6–20)
CO2: 21 mmol/L — ABNORMAL LOW (ref 22–32)
Calcium: 7.8 mg/dL — ABNORMAL LOW (ref 8.9–10.3)
Chloride: 101 mmol/L (ref 101–111)
Creatinine, Ser: 2.2 mg/dL — ABNORMAL HIGH (ref 0.61–1.24)
GFR calc Af Amer: 38 mL/min — ABNORMAL LOW (ref >60)
GFR calc non Af Amer: 32 mL/min — ABNORMAL LOW (ref >60)
Glucose, Bld: 280 mg/dL — ABNORMAL HIGH (ref 65–99)
Potassium: 4.3 mmol/L (ref 3.5–5.1)
Sodium: 132 mmol/L — ABNORMAL LOW (ref 135–145)

## 2017-01-13 LAB — TROPONIN I
Troponin I: <0.03 ng/mL (ref <0.03)
Troponin I: <0.03 ng/mL (ref <0.03)
Troponin I: 0.03 ng/mL (ref <0.03)

## 2017-01-13 LAB — LACTIC ACID, PLASMA
Lactic Acid, Venous: 1.8 mmol/L (ref 0.5–1.9)
Lactic Acid, Venous: 1.3 mmol/L (ref 0.5–1.9)

## 2017-01-13 LAB — BRAIN NATRIURETIC PEPTIDE: B Natriuretic Peptide: 46 pg/mL (ref 0.0–100.0)

## 2017-01-13 LAB — INFLUENZA PANEL BY PCR (TYPE A & B)
Influenza A By PCR: NEGATIVE
Influenza B By PCR: NEGATIVE

## 2017-01-13 LAB — CBC WITH DIFFERENTIAL/PLATELET
Basophils Absolute: 0 10*3/uL (ref 0–0.1)
Basophils Relative: 0 %
Eosinophils Absolute: 0.1 10*3/uL (ref 0–0.7)
Eosinophils Relative: 2 %
HCT: 29.4 % — ABNORMAL LOW (ref 40.0–52.0)
Hemoglobin: 9.7 g/dL — ABNORMAL LOW (ref 13.0–18.0)
Lymphocytes Relative: 30 %
Lymphs Abs: 1.9 10*3/uL (ref 1.0–3.6)
MCH: 27.2 pg (ref 26.0–34.0)
MCHC: 32.9 g/dL (ref 32.0–36.0)
MCV: 82.7 fL (ref 80.0–100.0)
Monocytes Absolute: 0.9 10*3/uL (ref 0.2–1.0)
Monocytes Relative: 14 %
Neutro Abs: 3.4 10*3/uL (ref 1.4–6.5)
Neutrophils Relative %: 54 %
Platelets: 226 10*3/uL (ref 150–440)
RBC: 3.55 MIL/uL — ABNORMAL LOW (ref 4.40–5.90)
RDW: 15.6 % — ABNORMAL HIGH (ref 11.5–14.5)
WBC: 6.3 10*3/uL (ref 3.8–10.6)

## 2017-01-13 LAB — GLUCOSE, CAPILLARY
Glucose-Capillary: 315 mg/dL — ABNORMAL HIGH (ref 65–99)
Glucose-Capillary: 359 mg/dL — ABNORMAL HIGH (ref 65–99)

## 2017-01-13 MED ORDER — CARVEDILOL 25 MG PO TABS
25.0000 mg | ORAL_TABLET | Freq: Two times a day (BID) | ORAL | Status: DC
  Administered 2017-01-13 – 2017-01-20 (×13): 25 mg via ORAL
  Filled 2017-01-13 (×14): qty 1

## 2017-01-13 MED ORDER — HEPARIN SODIUM (PORCINE) 5000 UNIT/ML IJ SOLN
5000.0000 [IU] | Freq: Three times a day (TID) | INTRAMUSCULAR | Status: DC
  Administered 2017-01-13 – 2017-01-16 (×9): 5000 [IU] via SUBCUTANEOUS
  Filled 2017-01-13 (×9): qty 1

## 2017-01-13 MED ORDER — DOCUSATE SODIUM 100 MG PO CAPS
100.0000 mg | ORAL_CAPSULE | Freq: Two times a day (BID) | ORAL | Status: DC
  Administered 2017-01-13 – 2017-01-15 (×4): 100 mg via ORAL
  Filled 2017-01-13 (×4): qty 1

## 2017-01-13 MED ORDER — FUROSEMIDE 40 MG PO TABS
40.0000 mg | ORAL_TABLET | Freq: Every day | ORAL | Status: DC
  Administered 2017-01-14 – 2017-01-16 (×3): 40 mg via ORAL
  Filled 2017-01-13 (×3): qty 1

## 2017-01-13 MED ORDER — GABAPENTIN 300 MG PO CAPS
300.0000 mg | ORAL_CAPSULE | Freq: Three times a day (TID) | ORAL | Status: DC
  Administered 2017-01-13 – 2017-01-20 (×21): 300 mg via ORAL
  Filled 2017-01-13 (×21): qty 1

## 2017-01-13 MED ORDER — INSULIN DETEMIR 100 UNIT/ML ~~LOC~~ SOLN
20.0000 [IU] | Freq: Two times a day (BID) | SUBCUTANEOUS | Status: DC
  Administered 2017-01-13 – 2017-01-14 (×3): 20 [IU] via SUBCUTANEOUS
  Filled 2017-01-13 (×6): qty 0.2

## 2017-01-13 MED ORDER — ORAL CARE MOUTH RINSE
15.0000 mL | Freq: Two times a day (BID) | OROMUCOSAL | Status: DC
  Administered 2017-01-13 – 2017-01-20 (×12): 15 mL via OROMUCOSAL

## 2017-01-13 MED ORDER — SODIUM CHLORIDE 0.9 % IV SOLN
Freq: Once | INTRAVENOUS | Status: AC
  Administered 2017-01-13: 16:00:00 via INTRAVENOUS

## 2017-01-13 MED ORDER — ASPIRIN EC 81 MG PO TBEC
81.0000 mg | DELAYED_RELEASE_TABLET | Freq: Every day | ORAL | Status: DC
  Administered 2017-01-13 – 2017-01-20 (×8): 81 mg via ORAL
  Filled 2017-01-13 (×8): qty 1

## 2017-01-13 MED ORDER — INSULIN ASPART 100 UNIT/ML ~~LOC~~ SOLN
0.0000 [IU] | Freq: Three times a day (TID) | SUBCUTANEOUS | Status: DC
  Administered 2017-01-13: 8 [IU] via SUBCUTANEOUS
  Administered 2017-01-14 (×2): 15 [IU] via SUBCUTANEOUS
  Filled 2017-01-13 (×3): qty 1

## 2017-01-13 MED ORDER — SODIUM CHLORIDE 0.9% FLUSH: 3.0000 mL | INTRAVENOUS | Status: DC | PRN

## 2017-01-13 MED ORDER — DEXTROSE 5 % IV SOLN
500.0000 mg | INTRAVENOUS | Status: DC
  Administered 2017-01-14 – 2017-01-15 (×2): 500 mg via INTRAVENOUS
  Filled 2017-01-13 (×2): qty 500

## 2017-01-13 MED ORDER — ACETAMINOPHEN 650 MG RE SUPP: 650.0000 mg | Freq: Four times a day (QID) | RECTAL | Status: DC | PRN

## 2017-01-13 MED ORDER — SODIUM CHLORIDE 0.9% FLUSH
3.0000 mL | Freq: Two times a day (BID) | INTRAVENOUS | Status: DC
  Administered 2017-01-13 – 2017-01-20 (×15): 3 mL via INTRAVENOUS

## 2017-01-13 MED ORDER — PANTOPRAZOLE SODIUM 40 MG PO TBEC
40.0000 mg | DELAYED_RELEASE_TABLET | Freq: Every day | ORAL | Status: DC
  Administered 2017-01-13 – 2017-01-20 (×8): 40 mg via ORAL
  Filled 2017-01-13 (×8): qty 1

## 2017-01-13 MED ORDER — ENALAPRIL MALEATE 20 MG PO TABS
40.0000 mg | ORAL_TABLET | Freq: Every day | ORAL | Status: DC
  Administered 2017-01-13 – 2017-01-20 (×7): 40 mg via ORAL
  Filled 2017-01-13 (×8): qty 2

## 2017-01-13 MED ORDER — ONDANSETRON HCL 4 MG PO TABS: 4.0000 mg | ORAL_TABLET | Freq: Four times a day (QID) | ORAL | Status: DC | PRN

## 2017-01-13 MED ORDER — SODIUM CHLORIDE 0.9 % IV SOLN: 250.0000 mL | INTRAVENOUS | Status: DC | PRN

## 2017-01-13 MED ORDER — ONDANSETRON HCL 4 MG/2ML IJ SOLN
4.0000 mg | Freq: Four times a day (QID) | INTRAMUSCULAR | Status: DC | PRN
Start: 2017-01-13 — End: 2017-01-20

## 2017-01-13 MED ORDER — OXYCODONE-ACETAMINOPHEN 5-325 MG PO TABS: 1.0000 | ORAL_TABLET | ORAL | Status: DC | PRN

## 2017-01-13 MED ORDER — INSULIN ASPART 100 UNIT/ML ~~LOC~~ SOLN
0.0000 [IU] | Freq: Every day | SUBCUTANEOUS | Status: DC
Start: 2017-01-13 — End: 2017-01-14
  Administered 2017-01-13: 5 [IU] via SUBCUTANEOUS
  Filled 2017-01-13: qty 1

## 2017-01-13 MED ORDER — DEXTROSE 5 % IV SOLN
1.0000 g | INTRAVENOUS | Status: DC
  Administered 2017-01-14 – 2017-01-17 (×4): 1 g via INTRAVENOUS
  Filled 2017-01-13 (×4): qty 10

## 2017-01-13 MED ORDER — LEVOFLOXACIN IN D5W 750 MG/150ML IV SOLN
750.0000 mg | Freq: Once | INTRAVENOUS | Status: AC
  Administered 2017-01-13: 750 mg via INTRAVENOUS
  Filled 2017-01-13: qty 150

## 2017-01-13 MED ORDER — ACETAMINOPHEN 325 MG PO TABS: 650.0000 mg | ORAL_TABLET | Freq: Four times a day (QID) | ORAL | Status: DC | PRN

## 2017-01-13 NOTE — H&P (Signed)
[facility] NAME: Raymond [name]    MR#:  [id number]  DATE OF BIRTH:  [birth year]  DATE OF ADMISSION:  01/13/2017  PRIMARY CARE PHYSICIAN: [facility] [place]   REQUESTING/REFERRING PHYSICIAN:   CHIEF COMPLAINT:   Chief Complaint  Patient presents with  . Shortness of Breath    HISTORY OF PRESENT ILLNESS: Raymond [name]  is a 51 y.o. male with a known history of congestive heart failure, diabetes mellitus type 2, chronic respiratory failure with oxygen by nasal cannula at 2 L at home, diabetes mellitus, GERD, hepatic steatosis presented to the emergency room with increased shortness of breath for the last few days.  Patient also has some cough.  He usually uses oxygen via nasal cannula at 2 L but he currently requires 4-5 L via nasal cannula in the emergency room.  No recent travel, sick contacts at home.  Patient was worked up with chest x-ray which showed right lung pneumonia.  [facility] nonrebreather mask but was weaned to nasal cannula.  Hospitalist service was consulted for further care.  PAST MEDICAL HISTORY:   Past Medical History:  Diagnosis Date  . Arthritis    hands  . Cellulitis   . CHF (congestive heart failure) ([place])   . Chronic respiratory failure with hypoxia (HCC)    2 L [place] continuously  . Diabetes ([place])   . Edema    FEET/LEGS  . GERD (gastroesophageal reflux disease)   . Hepatic steatosis   . HOH (hard of hearing)   . Hypertension   . Morbid obesity ([place])   . Neuropathy   . Orthopnea   . OSA on CPAP    TRILOGY VENTILATOR  . Oxygen deficiency    2L  HS  . PVD (peripheral vascular disease) ([place])   . Shortness of breath dyspnea   . Systolic heart failure (HCC)    Preserved EF 50-55%    PAST SURGICAL HISTORY:  Past Surgical History:  Procedure Laterality Date  . AMPUTATION Right 10/02/2014   Procedure: AMPUTATION  RAY;  Surgeon: Albertine Patricia, DPM;  Location: [facility] ORS;  Service: Podiatry;  Laterality: Right;  . CATARACT EXTRACTION W/PHACO Right 09/10/2015   Procedure: CATARACT EXTRACTION PHACO AND INTRAOCULAR LENS PLACEMENT (IOC);  Surgeon: Birder Robson, MD;  Location: [facility] ORS;  Service: Ophthalmology;  Laterality: Right;  [place] 01:34 AP% 19.3 CDE 18.36 Fluid pack lot # [id number]  . CATARACT EXTRACTION W/PHACO Left 10/06/2015   Procedure: CATARACT EXTRACTION PHACO AND INTRAOCULAR LENS PLACEMENT (IOC);  Surgeon: Birder Robson, MD;  Location: [facility] ORS;  Service: Ophthalmology;  Laterality: Left;  [place] 00:56 AP% 19.5 CDE 11.02 Fluid Pack # [id number]  . CHOLECYSTECTOMY    . COLONOSCOPY WITH PROPOFOL N/A 05/17/2016   Procedure: COLONOSCOPY WITH PROPOFOL;  Surgeon: Lollie Sails, MD;  Location: [facility];  Service: Endoscopy;  Laterality: N/A;  . EYE SURGERY Left    bilat laser  . HIP SURGERY Right    8 th grade pins  . NOSE SURGERY    . PERIPHERAL VASCULAR CATHETERIZATION Right 10/02/2014   Procedure: Lower Extremity Angiography;  Surgeon: Algernon Huxley, MD;  Location: [place] [facility];  Service: Cardiovascular;  Laterality: Right;  . TOE AMPUTATION Left 2010   2cd  . TOE AMPUTATION Right 2009    SOCIAL HISTORY:  Social History   Tobacco Use  . Smoking status: Never Smoker  .  Smokeless tobacco: Never Used  Substance Use Topics  . Alcohol use: No    Alcohol/week: 0.0 oz    FAMILY HISTORY:  Family History  Problem Relation Age of Onset  . Hypertension Mother   . Diabetes Mother   . Stroke Father   . Hypertension Father   . Heart attack Father     DRUG ALLERGIES: No Known Allergies  REVIEW OF SYSTEMS:   CONSTITUTIONAL: No fever, fatigue or weakness.  EYES: No blurred or double vision.  EARS, NOSE, AND THROAT: No tinnitus or ear pain.  RESPIRATORY: Has cough, shortness of breath,  No wheezing or hemoptysis.  CARDIOVASCULAR: No chest pain, orthopnea, edema.   GASTROINTESTINAL: No nausea, vomiting, diarrhea or abdominal pain.  GENITOURINARY: No dysuria, hematuria.  ENDOCRINE: No polyuria, nocturia,  HEMATOLOGY: No anemia, easy bruising or bleeding SKIN: No rash or lesion. MUSCULOSKELETAL: No joint pain or arthritis.   NEUROLOGIC: No tingling, numbness, weakness.  PSYCHIATRY: No anxiety or depression.   MEDICATIONS AT HOME:  Prior to Admission medications   Medication Sig Start Date End Date Taking? Authorizing Provider  aspirin EC 81 MG tablet Take 81 mg by mouth daily.   Yes [provider]  carvedilol (COREG) 25 MG tablet Take 25 mg by mouth 2 (two) times daily.    Yes [provider]  enalapril (VASOTEC) 20 MG tablet Take 40 mg by mouth daily.    Yes [provider]  furosemide (LASIX) 40 MG tablet Take 1 tablet (40 mg total) by mouth 2 (two) times daily as needed. Patient taking differently: Take 40 mg by mouth 2 (two) times daily as needed for edema.  05/06/14  Yes Minna Merritts, MD  gabapentin (NEURONTIN) 300 MG capsule Take 300 mg by mouth 3 (three) times daily.   Yes [provider]  insulin detemir (LEVEMIR) 100 UNIT/ML injection Inject 89 Units into the skin 2 (two) times daily.    Yes [provider]  insulin lispro (HUMALOG) 100 UNIT/ML cartridge Inject 60 Units into the skin 3 (three) times daily with meals.   Yes [provider]  metFORMIN (GLUCOPHAGE) 1000 MG tablet Take 1,000 mg by mouth 2 (two) times daily with a meal.   Yes [provider]  pantoprazole (PROTONIX) 40 MG tablet Take 40 mg by mouth daily.   Yes [provider]  oxyCODONE-acetaminophen (ROXICET) 5-325 MG tablet Take 1 tablet by mouth every 4 (four) hours as needed for severe pain. 04/21/16   Nance Pear, MD  sulfamethoxazole-trimethoprim (BACTRIM DS,SEPTRA DS) 800-160 MG tablet Take 1 tablet by mouth 2 (two) times daily. Patient not taking: Reported on 01/13/2017 11/21/16   Nance Pear, MD      PHYSICAL EXAMINATION:   VITAL SIGNS: Blood pressure (!) 112/46, pulse 63, temperature 99.8 F (37.7 C), temperature source Oral, resp. rate (!) 24, weight (!) 181.4 kg (400 lb), SpO2 91 %.  GENERAL:  51 y.o.-year-old patient lying in the bed with no acute distress.  EYES: Pupils equal, round, reactive to light and accommodation. No scleral icterus. Extraocular muscles intact.  HEENT: Head atraumatic, normocephalic. Oropharynx and nasopharynx clear.  NECK:  Supple, no jugular venous distention. No thyroid enlargement, no tenderness.  LUNGS: Decreased breath sounds bilaterally, rales heard in right lung. No use of accessory muscles of respiration.  CARDIOVASCULAR: S1, S2 normal. No murmurs, rubs, or gallops.  ABDOMEN: Soft, nontender, nondistended. Bowel sounds present. No organomegaly or mass.  EXTREMITIES: Has pedal edema, No cyanosis, or clubbing.  NEUROLOGIC:  Cranial nerves II through XII are intact. Muscle strength 5/5 in all extremities. Sensation intact. Gait not checked.  PSYCHIATRIC: The patient is alert and oriented x 3.  SKIN: No obvious rash, lesion, or ulcer.   LABORATORY PANEL:   CBC Recent Labs  Lab 01/13/17 1149  WBC 6.3  HGB 9.7*  HCT 29.4*  PLT 226  MCV 82.7  MCH 27.2  MCHC 32.9  RDW 15.6*  LYMPHSABS 1.9  MONOABS 0.9  EOSABS 0.1  BASOSABS 0.0   ------------------------------------------------------------------------------------------------------------------  Chemistries  Recent Labs  Lab 01/13/17 1149  NA 132*  K 4.3  CL 101  CO2 21*  GLUCOSE 280*  BUN 47*  CREATININE 2.20*  CALCIUM 7.8*   ------------------------------------------------------------------------------------------------------------------ estimated creatinine clearance is 67 mL/min (A) (by C-G formula based on SCr of 2.2 mg/dL (H)). ------------------------------------------------------------------------------------------------------------------ No results for  input(s): TSH, T4TOTAL, T3FREE, THYROIDAB in the last 72 hours.  Invalid input(s): FREET3   Coagulation profile No results for input(s): INR, PROTIME in the last 168 hours. ------------------------------------------------------------------------------------------------------------------- No results for input(s): DDIMER in the last 72 hours. -------------------------------------------------------------------------------------------------------------------  Cardiac Enzymes Recent Labs  Lab 01/13/17 1149  TROPONINI <0.03   ------------------------------------------------------------------------------------------------------------------ Invalid input(s): POCBNP  ---------------------------------------------------------------------------------------------------------------  Urinalysis No results found for: COLORURINE, APPEARANCEUR, LABSPEC, PHURINE, GLUCOSEU, HGBUR, BILIRUBINUR, KETONESUR, PROTEINUR, UROBILINOGEN, NITRITE, LEUKOCYTESUR   RADIOLOGY: Dg Chest Port 1 View  Result Date: 01/13/2017 CLINICAL DATA:  Shortness of breath over the last day. EXAM: PORTABLE CHEST 1 VIEW COMPARISON:  10/03/2014 FINDINGS: There is right lower lobe and possibly right middle lobe pneumonia and collapse. Right upper lobe is clear. Left lung is clear. Heart size is normal. No visible effusion. No bone abnormality. IMPRESSION: Right lower lobe and possibly right middle lobe pneumonia and collapse. Electronically Signed   By: Nelson Chimes M.D.   On: 01/13/2017 12:27    EKG: Orders placed or performed during the hospital encounter of 01/13/17  . ED EKG  . ED EKG  . EKG 12-Lead  . EKG 12-Lead    IMPRESSION AND PLAN: 51 year old male patient with history of congestive heart failure, chronic respiratory failure, diabetes mellitus, hypertension, obesity, neuropathy presented to the emergency room with shortness of breath and cough.  Admitting diagnosis 1.  Right lung community-acquired pneumonia 2.   Hypoxia 3.  Acute on chronic respiratory failure 4.  Chronic congestive heart failure 5.  Type 2 diabetes mellitus 6.  Hypertension  Treatment plan Admit patient to medical floor Start patient on IV Rocephin and IV Zithromax antibiotics Oxygen via nasal cannula at 4-5 L Diuresis with Lasix and monitor renal function Medical management for diabetes mellitus DVT prophylaxis subcu heparin Pulmonary consult   All the records are reviewed and case discussed with ED provider. Management plans discussed with the patient, family and they are in agreement.  CODE STATUS:FULL CODE Code Status History    Date Active Date Inactive Code Status Order ID Comments User Context   09/30/2014 17:22 10/04/2014 17:07 Full Code [id number]  Henreitta Leber, MD Inpatient       TOTAL TIME TAKING CARE OF THIS PATIENT: 50 minutes.    Saundra Shelling M.D on 01/13/2017 at 1:56 PM  Between 7am to 6pm - Pager - [phone]  After 6pm go to [email or web address] - password [email or web address] [facility]  [facility]  Office  [phone]  CC: Primary care physician; [facility]

## 2017-01-13 NOTE — ED Provider Notes (Signed)
[facility] Emergency Department Provider Note       Time seen: ----------------------------------------- 12:04 PM on 01/13/2017 -----------------------------------------   I have reviewed the triage vital signs and the nursing notes.  HISTORY   Chief Complaint Shortness of Breath    HPI Raymond [name] is a 51 y.o. male with a history of COPD and CHF who presents to the ED for shortness of breath and diarrhea.  Patient states symptoms started about a week ago with chills but no fever.  He has had a dry cough and copious diarrhea but really no other symptoms.  [facility] a nonrebreather.  He was given Solu-Medrol and duo nebs prior to arrival.  Past Medical History:  Diagnosis Date  . Arthritis    hands  . Cellulitis   . CHF (congestive heart failure) ([place])   . Chronic respiratory failure with hypoxia (HCC)    2 L [place] continuously  . Diabetes ([place])   . Edema    FEET/LEGS  . GERD (gastroesophageal reflux disease)   . Hepatic steatosis   . HOH (hard of hearing)   . Hypertension   . Morbid obesity ([place])   . Neuropathy   . Orthopnea   . OSA on CPAP    TRILOGY VENTILATOR  . Oxygen deficiency    2L  HS  . PVD (peripheral vascular disease) ([place])   . Shortness of breath dyspnea   . Systolic heart failure (HCC)    Preserved EF 50-55%    Patient Active Problem List   Diagnosis Date Noted  . Cellulitis of right foot 09/30/2014  . COPD, moderate ([place]) 07/07/2014  . Chronic diastolic heart failure ([place]) 05/06/2014  . Dyspnea 04/28/2014  . OSA treated with Trilogy machine 04/28/2014  . Hypoxemia requiring supplemental oxygen 04/28/2014  . Morbid obesity ([place]) 04/28/2014    Past Surgical History:  Procedure Laterality Date  . AMPUTATION Right 10/02/2014   Procedure: AMPUTATION RAY;  Surgeon: Albertine Patricia, DPM;  Location: [facility] ORS;  Service: Podiatry;  Laterality: Right;  . CATARACT EXTRACTION W/PHACO Right  09/10/2015   Procedure: CATARACT EXTRACTION PHACO AND INTRAOCULAR LENS PLACEMENT (IOC);  Surgeon: Birder Robson, MD;  Location: [facility] ORS;  Service: Ophthalmology;  Laterality: Right;  [place] 01:34 AP% 19.3 CDE 18.36 Fluid pack lot # [id number]  . CATARACT EXTRACTION W/PHACO Left 10/06/2015   Procedure: CATARACT EXTRACTION PHACO AND INTRAOCULAR LENS PLACEMENT (IOC);  Surgeon: Birder Robson, MD;  Location: [facility] ORS;  Service: Ophthalmology;  Laterality: Left;  [place] 00:56 AP% 19.5 CDE 11.02 Fluid Pack # [id number]  . CHOLECYSTECTOMY    . COLONOSCOPY WITH PROPOFOL N/A 05/17/2016   Procedure: COLONOSCOPY WITH PROPOFOL;  Surgeon: Lollie Sails, MD;  Location: [facility];  Service: Endoscopy;  Laterality: N/A;  . EYE SURGERY Left    bilat laser  . HIP SURGERY Right    8 th grade pins  . NOSE SURGERY    . PERIPHERAL VASCULAR CATHETERIZATION Right 10/02/2014   Procedure: Lower Extremity Angiography;  Surgeon: Algernon Huxley, MD;  Location: [place] CV LAB;  Service: Cardiovascular;  Laterality: Right;  . TOE AMPUTATION Left 2010   2cd  . TOE AMPUTATION Right 2009    Allergies Patient has no known allergies.  Social History Social History   Tobacco Use  . Smoking status: Never Smoker  . Smokeless tobacco: Never Used  Substance Use Topics  . Alcohol use: No    Alcohol/week: 0.0 oz  . Drug  use: No    Review of Systems Constitutional: Negative for fever. Eyes: Negative for vision changes ENT:  Negative for congestion, sore throat Cardiovascular: Negative for chest pain. Respiratory: Positive for shortness of breath and cough Gastrointestinal: Negative for abdominal pain, vomiting.  Positive for diarrhea Genitourinary: Negative for dysuria. Musculoskeletal: Negative for back pain. Skin: Negative for rash. Neurological: Negative for headaches, focal weakness or numbness.  All systems negative/normal/unremarkable except as stated in the  HPI  ____________________________________________   PHYSICAL EXAM:  VITAL SIGNS: ED Triage Vitals  Enc Vitals Group     BP 01/13/17 1153 107/69     Pulse Rate 01/13/17 1153 61     Resp 01/13/17 1153 (!) 24     Temp 01/13/17 1153 99.8 F (37.7 C)     Temp Source 01/13/17 1153 Oral     SpO2 01/13/17 1153 (!) 88 %     Weight 01/13/17 1146 (!) 400 lb (181.4 kg)     Height --      Head Circumference --      Peak Flow --      Pain Score --      Pain Loc --      Pain Edu? --      Excl. in [place]? --    Constitutional: Alert and oriented. Well appearing and in no distress. Eyes: Conjunctivae are normal. Normal extraocular movements. ENT   Head: Normocephalic and atraumatic.   Nose: No congestion/rhinnorhea.   Mouth/Throat: Mucous membranes are moist.   Neck: No stridor. Cardiovascular: Normal rate, regular rhythm. No murmurs, rubs, or gallops. Respiratory: Normal respiratory effort without tachypnea nor retractions. Breath sounds are clear and equal bilaterally. No wheezes/rales/rhonchi. Gastrointestinal: Soft and nontender. Normal bowel sounds Musculoskeletal: Nontender with normal range of motion in extremities. No lower extremity tenderness nor edema. Neurologic:  Normal speech and language. No gross focal neurologic deficits are appreciated.  Skin:  Skin is warm, dry and intact. No rash noted. Psychiatric: Mood and affect are normal. Speech and behavior are normal.  ____________________________________________  EKG: Interpreted by me.  Sinus rhythm with PACs, rate of 62 bpm, normal PR interval, normal QRS, normal QT.  ____________________________________________  ED COURSE:  Pertinent labs & imaging results that were available during my care of the patient were reviewed by me and considered in my medical decision making (see chart for details). Patient presents for shortness of breath and diarrhea, we will assess with labs and imaging as indicated.    Procedures ____________________________________________   LABS (pertinent positives/negatives)  Labs Reviewed  CBC WITH DIFFERENTIAL/PLATELET - Abnormal; Notable for the following components:      Result Value   RBC 3.55 (*)    Hemoglobin 9.7 (*)    HCT 29.4 (*)    RDW 15.6 (*)    All other components within normal limits  BASIC METABOLIC PANEL - Abnormal; Notable for the following components:   Sodium 132 (*)    CO2 21 (*)    Glucose, Bld 280 (*)    BUN 47 (*)    Creatinine, Ser 2.20 (*)    Calcium 7.8 (*)    GFR calc non Af Amer 32 (*)    GFR calc Af Amer 38 (*)    All other components within normal limits  CULTURE, BLOOD (ROUTINE X 2)  CULTURE, BLOOD (ROUTINE X 2)  TROPONIN I  LACTIC ACID, PLASMA  BRAIN NATRIURETIC PEPTIDE  INFLUENZA PANEL BY PCR (TYPE A & B)  LACTIC ACID, PLASMA  RADIOLOGY Images were viewed by me  Chest x-ray IMPRESSION: Right lower lobe and possibly right middle lobe pneumonia and collapse. ____________________________________________ CRITICAL CARE Performed by: Heitor, Steinhoff   Total critical care time: 30 minutes  Critical care time was exclusive of separately billable procedures and treating other patients.  Critical care was necessary to treat or prevent imminent or life-threatening deterioration.  Critical care was time spent personally by me on the following activities: development of treatment plan with patient and/or surrogate as well as nursing, discussions with consultants, evaluation of patient's response to treatment, examination of patient, obtaining history from patient or surrogate, ordering and performing treatments and interventions, ordering and review of laboratory studies, ordering and review of radiographic studies, pulse oximetry and re-evaluation of patient's condition.   DIFFERENTIAL DIAGNOSIS   URI, pneumonia, COPD, CHF, influenza, viral syndrome  FINAL ASSESSMENT AND PLAN  Dyspnea, diarrhea,  pneumonia, dehydration   Plan: Patient had presented for a week long history of symptoms including dyspnea and diarrhea. Patient's labs indicate acute renal failure likely from dehydration.  He has had diarrhea as well as decreased oral intake. Patient's imaging revealed a right lower lobe and possibly right middle lobe pneumonia.  We have started him on IV Levaquin.  His oxygen levels are 91% on nasal cannula oxygen.  I will discussed with the hospitalist for admission.   Earleen Newport, MD   Note: This note was generated in part or whole with voice recognition software. Voice recognition is usually quite accurate but there are transcription errors that can and very often do occur. I apologize for any typographical errors that were not detected and corrected.     Earleen Newport, MD 01/13/17 [id number]

## 2017-01-13 NOTE — ED Triage Notes (Signed)
Pt here for [facility] from [facility]. Arrived on [facility]. sats 88-92 on 6 L [place]. Solumedrol and neb X 2 with EMS. No fevers per pt. Tachypnea but unlabored at this time.

## 2017-01-13 NOTE — Progress Notes (Signed)
Pharmacy Antibiotic Note  Raymond [name] is a 51 y.o. male admitted on 01/13/2017 with community aquired pneumonia.  Pharmacy has been consulted for Ceftriaxone dosing.  Patient received one dose of Levofloxacin 750mg  IV once.   Plan: Will start Ceftriaxone 1g daily. Patient also has Azithromycin 500mg  daily ordered. Pharmacy will continue to monitor and adjust as needed.   Height: 6\' 2"  (188 cm) Weight: (!) 379 lb 8 oz (172.1 kg) IBW/kg (Calculated) : 82.2  Temp (24hrs), Avg:99.3 F (37.4 C), Min:98.8 F (37.1 C), Max:99.8 F (37.7 C)  Recent Labs  Lab 01/13/17 1149 01/13/17 1150 01/13/17 1246  WBC 6.3  --   --   CREATININE 2.20*  --   --   LATICACIDVEN  --  1.8 1.3    Estimated Creatinine Clearance: 64.9 mL/min (A) (by C-G formula based on SCr of 2.2 mg/dL (H)).    No Known Allergies  Antimicrobials this admission: Levofloxacin 12/14 >> once  Ceftriaxone 12/15 >> Azithromycin 12/15 >>  Dose adjustments this admission:   Microbiology results: BCx: 12/15 >> sent   Thank you for allowing pharmacy to be a part of this patient's care.  Lendon Ka, PharmD Pharmacy Resident 01/13/2017 3:11 PM

## 2017-01-14 LAB — CBC
HCT: 33.1 % — ABNORMAL LOW (ref 40.0–52.0)
Hemoglobin: 10.9 g/dL — ABNORMAL LOW (ref 13.0–18.0)
MCH: 27 pg (ref 26.0–34.0)
MCHC: 32.9 g/dL (ref 32.0–36.0)
MCV: 82 fL (ref 80.0–100.0)
Platelets: 250 10*3/uL (ref 150–440)
RBC: 4.04 MIL/uL — ABNORMAL LOW (ref 4.40–5.90)
RDW: 15.5 % — ABNORMAL HIGH (ref 11.5–14.5)
WBC: 5.1 10*3/uL (ref 3.8–10.6)

## 2017-01-14 LAB — GLUCOSE, CAPILLARY
Glucose-Capillary: 371 mg/dL — ABNORMAL HIGH (ref 65–99)
Glucose-Capillary: 469 mg/dL — ABNORMAL HIGH (ref 65–99)
Glucose-Capillary: 587 mg/dL (ref 65–99)
Glucose-Capillary: 559 mg/dL (ref 65–99)
Glucose-Capillary: 504 mg/dL (ref 65–99)

## 2017-01-14 LAB — BASIC METABOLIC PANEL
Anion gap: 9 (ref 5–15)
BUN: 41 mg/dL — ABNORMAL HIGH (ref 6–20)
CO2: 21 mmol/L — ABNORMAL LOW (ref 22–32)
Calcium: 8 mg/dL — ABNORMAL LOW (ref 8.9–10.3)
Chloride: 104 mmol/L (ref 101–111)
Creatinine, Ser: 1.56 mg/dL — ABNORMAL HIGH (ref 0.61–1.24)
GFR calc Af Amer: 57 mL/min — ABNORMAL LOW (ref >60)
GFR calc non Af Amer: 49 mL/min — ABNORMAL LOW (ref >60)
Glucose, Bld: 383 mg/dL — ABNORMAL HIGH (ref 65–99)
Potassium: 5.3 mmol/L — ABNORMAL HIGH (ref 3.5–5.1)
Sodium: 134 mmol/L — ABNORMAL LOW (ref 135–145)

## 2017-01-14 LAB — TROPONIN I: Troponin I: <0.03 ng/mL (ref <0.03)

## 2017-01-14 LAB — HIV ANTIBODY (ROUTINE TESTING W REFLEX): HIV Screen 4th Generation wRfx: NONREACTIVE

## 2017-01-14 MED ORDER — INSULIN ASPART 100 UNIT/ML ~~LOC~~ SOLN: 0.0000 [IU] | Freq: Three times a day (TID) | SUBCUTANEOUS | Status: DC

## 2017-01-14 MED ORDER — IPRATROPIUM-ALBUTEROL 0.5-2.5 (3) MG/3ML IN SOLN
3.0000 mL | RESPIRATORY_TRACT | Status: DC
  Administered 2017-01-14 – 2017-01-15 (×6): 3 mL via RESPIRATORY_TRACT
  Filled 2017-01-14 (×5): qty 3

## 2017-01-14 MED ORDER — INSULIN ASPART 100 UNIT/ML ~~LOC~~ SOLN: 25.0000 [IU] | Freq: Once | SUBCUTANEOUS | Status: DC

## 2017-01-14 MED ORDER — INSULIN ASPART 100 UNIT/ML ~~LOC~~ SOLN
0.0000 [IU] | Freq: Three times a day (TID) | SUBCUTANEOUS | Status: DC
  Administered 2017-01-15 (×3): 20 [IU] via SUBCUTANEOUS
  Administered 2017-01-16: 15 [IU] via SUBCUTANEOUS
  Administered 2017-01-16: 11 [IU] via SUBCUTANEOUS
  Administered 2017-01-16: 20 [IU] via SUBCUTANEOUS
  Administered 2017-01-17 (×2): 11 [IU] via SUBCUTANEOUS
  Administered 2017-01-17: 20 [IU] via SUBCUTANEOUS
  Administered 2017-01-18: 15 [IU] via SUBCUTANEOUS
  Administered 2017-01-18 (×2): 20 [IU] via SUBCUTANEOUS
  Filled 2017-01-14 (×12): qty 1

## 2017-01-14 MED ORDER — METHYLPREDNISOLONE SODIUM SUCC 125 MG IJ SOLR
60.0000 mg | INTRAMUSCULAR | Status: DC
  Administered 2017-01-14: 60 mg via INTRAVENOUS
  Filled 2017-01-14: qty 2

## 2017-01-14 MED ORDER — INSULIN ASPART 100 UNIT/ML IV SOLN
10.0000 [IU] | Freq: Once | INTRAVENOUS | Status: AC
  Administered 2017-01-14: 10 [IU] via INTRAVENOUS
  Filled 2017-01-14: qty 0.1

## 2017-01-14 MED ORDER — INSULIN ASPART 100 UNIT/ML ~~LOC~~ SOLN: 0.0000 [IU] | Freq: Every day | SUBCUTANEOUS | Status: DC

## 2017-01-14 MED ORDER — INSULIN REGULAR HUMAN 100 UNIT/ML IJ SOLN
15.0000 [IU] | Freq: Once | INTRAMUSCULAR | Status: AC
  Administered 2017-01-14: 15 [IU] via INTRAVENOUS
  Filled 2017-01-14: qty 0.15

## 2017-01-14 MED ORDER — DEXTROSE 50 % IV SOLN
25.0000 mL | Freq: Once | INTRAVENOUS | Status: AC
  Administered 2017-01-14: 25 mL via INTRAVENOUS
  Filled 2017-01-14: qty 50

## 2017-01-14 MED ORDER — INSULIN ASPART 100 UNIT/ML ~~LOC~~ SOLN
20.0000 [IU] | Freq: Once | SUBCUTANEOUS | Status: AC
  Administered 2017-01-14: 20 [IU] via SUBCUTANEOUS
  Filled 2017-01-14: qty 1

## 2017-01-14 NOTE — Progress Notes (Signed)
Pt's 5pm fsbs 587. Dr mody notified. Pharmacy recommends 20 units novolog. Order placed and given.

## 2017-01-14 NOTE — Progress Notes (Signed)
Decreased to 45% on ventrui mask

## 2017-01-14 NOTE — Progress Notes (Signed)
CBG=559 mg/dL. Dr. Jannifer Franklin notified with  a new order for IV regular insulin as noted in the [facility]. Order implemented as received. Patient is asymptomatic for the hyperglycemic episode. Will recheck and continue to monitor.

## 2017-01-14 NOTE — Progress Notes (Signed)
Patient refused to send his belongings to the safe.

## 2017-01-14 NOTE — Progress Notes (Signed)
Changed to a 55% venturi Mask

## 2017-01-14 NOTE — Progress Notes (Signed)
CBG=504mg /dl after IV insulin and Levemir. Dr. Jannifer Franklin notified and he indicated to recheck at [place] call him back if  greater than 300.  Patient is still asymptomatic. Per respiratory patient needs a number for the CPAP and cannot use automatic because of patient size. Dr. Jannifer Franklin notified and received new order as well. Will continue to monitor.

## 2017-01-14 NOTE — Progress Notes (Signed)
The second troponin came slightly positive 0.03. Patient is chest pain free. Dr. Jannifer Franklin notified without any new order. Will continue to monitor.

## 2017-01-14 NOTE — Progress Notes (Signed)
[place] at [place] NAME: Gaspard Isbell    MR#:  [id number]  DATE OF BIRTH:  [birth year]  SUBJECTIVE:   Patient is feeling better this am now on venturi mask   REVIEW OF SYSTEMS:    Review of Systems  Constitutional: Negative for fever, chills weight loss HENT: Negative for ear pain, nosebleeds, congestion, facial swelling, rhinorrhea, neck pain, neck stiffness and ear discharge.   Respiratory: ++ for cough, shortness of breath, NO wheezing  Cardiovascular: Negative for chest pain, palpitations and leg swelling.  Gastrointestinal: Negative for heartburn, abdominal pain, vomiting, diarrhea or consitpation Genitourinary: Negative for dysuria, urgency, frequency, hematuria Musculoskeletal: Negative for back pain or joint pain Neurological: Negative for dizziness, seizures, syncope, focal weakness,  numbness and headaches.  Hematological: Does not bruise/bleed easily.  Psychiatric/Behavioral: Negative for hallucinations, confusion, dysphoric mood    Tolerating Diet: yes      DRUG ALLERGIES:  No Known Allergies  VITALS:  Blood pressure (!) 106/40, pulse (!) 58, temperature 98.4 F (36.9 C), temperature source Oral, resp. rate 18, height 6\' 2"  (1.88 m), weight (!) 172.1 kg (379 lb 8 oz), SpO2 91 %.  PHYSICAL EXAMINATION:  Constitutional: morbid obesity. No distress. HENT: Normocephalic. Marland Kitchen Oropharynx is clear and moist.  Eyes: Conjunctivae and EOM are normal. PERRLA, no scleral icterus.  Neck: Normal ROM. Neck supple. No JVD. No tracheal deviation. CVS: RRR, S1/S2 +, no murmurs, no gallops, no carotid bruit.  Pulmonary: Effort normal, diminished throughoutno stridor, rhonchi, wheezes, rales.  Abdominal: Soft. BS +,  no distension, tenderness, rebound or guarding.  Musculoskeletal: Normal range of motion. No edema and no tenderness.  Neuro: Alert. CN 2-12 grossly intact. No focal deficits. Skin: Skin is warm and dry. No rash  noted. Psychiatric: Normal mood and affect.      LABORATORY PANEL:   CBC Recent Labs  Lab 01/14/17 0356  WBC 5.1  HGB 10.9*  HCT 33.1*  PLT 250   ------------------------------------------------------------------------------------------------------------------  Chemistries  Recent Labs  Lab 01/14/17 0356  NA 134*  K 5.3*  CL 104  CO2 21*  GLUCOSE 383*  BUN 41*  CREATININE 1.56*  CALCIUM 8.0*   ------------------------------------------------------------------------------------------------------------------  Cardiac Enzymes Recent Labs  Lab 01/13/17 1524 01/13/17 2111 01/14/17 0356  TROPONINI <0.03 0.03* <0.03   ------------------------------------------------------------------------------------------------------------------  RADIOLOGY:  Dg Chest Port 1 View  Result Date: 01/13/2017 CLINICAL DATA:  Shortness of breath over the last day. EXAM: PORTABLE CHEST 1 VIEW COMPARISON:  10/03/2014 FINDINGS: There is right lower lobe and possibly right middle lobe pneumonia and collapse. Right upper lobe is clear. Left lung is clear. Heart size is normal. No visible effusion. No bone abnormality. IMPRESSION: Right lower lobe and possibly right middle lobe pneumonia and collapse. Electronically Signed   By: Nelson Chimes M.D.   On: 01/13/2017 12:27     ASSESSMENT AND PLAN:    That she 51-year-old morbidly obese male with history of diastolic heart failure and chronic respiratory failure on 2 L of oxygen at night, OSA on CPAP and diabetes who presents shortness of breath and cough.  1. Acute hypoxic respiratory failure on chronic hypoxic respiratory failure due to community-acquired pneumonia: Patient has been weaned to Venturi mask Try to wean to nasal cannula Adds IV steroids and nebulizer treatments for today  2. Community-acquired pneumonia: Continue Rocephin and azithromycin Follow-up on final blood culture  3. Hyperkalemia: Treat with insulin and  dextrose Repeat BMP in a.m.  4. Chronic diastolic heart  failure without signs of exacerbation: Continue Lasix  5. Diabetes: Continue sliding scale, long-acting insulin and ADA diet  6. Essential hypertension: Continue enalapril, Coreg  7. Morbid obesity: Patient encouraged to lose weight    Management plans discussed with the patient and he is in agreement.  CODE STATUS: full  TOTAL TIME TAKING CARE OF THIS PATIENT: 30 minutes.     POSSIBLE D/C 1-2 days, DEPENDING ON CLINICAL CONDITION.   Dellanira Dillow M.D on 01/14/2017 at 9:20 AM  Between 7am to 6pm - Pager - [phone] After 6pm go to [email or web address] - password [email or web address] [facility]  Office  [phone]  CC: Primary care physician; Center, [facility]  Note: This dictation was prepared with Dragon dictation along with smaller phrase technology. Any transcriptional errors that result from this process are unintentional.

## 2017-01-15 LAB — GLUCOSE, CAPILLARY
Glucose-Capillary: 323 mg/dL — ABNORMAL HIGH (ref 65–99)
Glucose-Capillary: 361 mg/dL — ABNORMAL HIGH (ref 65–99)
Glucose-Capillary: 401 mg/dL — ABNORMAL HIGH (ref 65–99)
Glucose-Capillary: 416 mg/dL — ABNORMAL HIGH (ref 65–99)
Glucose-Capillary: 418 mg/dL — ABNORMAL HIGH (ref 65–99)
Glucose-Capillary: 461 mg/dL — ABNORMAL HIGH (ref 65–99)
Glucose-Capillary: 476 mg/dL — ABNORMAL HIGH (ref 65–99)

## 2017-01-15 LAB — BASIC METABOLIC PANEL
Anion gap: 8 (ref 5–15)
BUN: 47 mg/dL — ABNORMAL HIGH (ref 6–20)
CO2: 24 mmol/L (ref 22–32)
Calcium: 8.6 mg/dL — ABNORMAL LOW (ref 8.9–10.3)
Chloride: 102 mmol/L (ref 101–111)
Creatinine, Ser: 1.55 mg/dL — ABNORMAL HIGH (ref 0.61–1.24)
GFR calc Af Amer: 57 mL/min — ABNORMAL LOW (ref >60)
GFR calc non Af Amer: 49 mL/min — ABNORMAL LOW (ref >60)
Glucose, Bld: 407 mg/dL — ABNORMAL HIGH (ref 65–99)
Potassium: 5 mmol/L (ref 3.5–5.1)
Sodium: 134 mmol/L — ABNORMAL LOW (ref 135–145)

## 2017-01-15 MED ORDER — INSULIN DETEMIR 100 UNIT/ML ~~LOC~~ SOLN
25.0000 [IU] | Freq: Two times a day (BID) | SUBCUTANEOUS | Status: DC
  Administered 2017-01-15: 25 [IU] via SUBCUTANEOUS
  Filled 2017-01-15 (×2): qty 0.25

## 2017-01-15 MED ORDER — SENNA 8.6 MG PO TABS
1.0000 | ORAL_TABLET | Freq: Every day | ORAL | Status: DC
  Administered 2017-01-15 – 2017-01-19 (×5): 8.6 mg via ORAL
  Filled 2017-01-15 (×6): qty 1

## 2017-01-15 MED ORDER — INSULIN REGULAR HUMAN 100 UNIT/ML IJ SOLN
30.0000 [IU] | Freq: Once | INTRAMUSCULAR | Status: AC
  Administered 2017-01-15: 30 [IU] via INTRAVENOUS
  Filled 2017-01-15: qty 0.3

## 2017-01-15 MED ORDER — INSULIN DETEMIR 100 UNIT/ML ~~LOC~~ SOLN
40.0000 [IU] | Freq: Two times a day (BID) | SUBCUTANEOUS | Status: DC
  Filled 2017-01-15 (×2): qty 0.4

## 2017-01-15 MED ORDER — AZITHROMYCIN 250 MG PO TABS
500.0000 mg | ORAL_TABLET | Freq: Every day | ORAL | Status: DC
  Administered 2017-01-16 – 2017-01-20 (×5): 500 mg via ORAL
  Filled 2017-01-15 (×5): qty 2

## 2017-01-15 MED ORDER — INSULIN ASPART 100 UNIT/ML ~~LOC~~ SOLN: 15.0000 [IU] | Freq: Three times a day (TID) | SUBCUTANEOUS | Status: DC

## 2017-01-15 MED ORDER — METHYLPREDNISOLONE SODIUM SUCC 40 MG IJ SOLR
40.0000 mg | INTRAMUSCULAR | Status: DC
  Administered 2017-01-15 – 2017-01-17 (×3): 40 mg via INTRAVENOUS
  Filled 2017-01-15 (×3): qty 1

## 2017-01-15 MED ORDER — IPRATROPIUM-ALBUTEROL 0.5-2.5 (3) MG/3ML IN SOLN: 3.0000 mL | RESPIRATORY_TRACT | Status: DC | PRN

## 2017-01-15 MED ORDER — INSULIN DETEMIR 100 UNIT/ML ~~LOC~~ SOLN
60.0000 [IU] | Freq: Two times a day (BID) | SUBCUTANEOUS | Status: DC
  Administered 2017-01-15: 60 [IU] via SUBCUTANEOUS
  Filled 2017-01-15 (×2): qty 0.6

## 2017-01-15 MED ORDER — DOCUSATE SODIUM 100 MG PO CAPS
100.0000 mg | ORAL_CAPSULE | Freq: Two times a day (BID) | ORAL | Status: DC
  Administered 2017-01-15 – 2017-01-19 (×7): 100 mg via ORAL
  Filled 2017-01-15 (×9): qty 1

## 2017-01-15 MED ORDER — INSULIN ASPART 100 UNIT/ML ~~LOC~~ SOLN
5.0000 [IU] | Freq: Three times a day (TID) | SUBCUTANEOUS | Status: DC
  Administered 2017-01-15 – 2017-01-16 (×3): 5 [IU] via SUBCUTANEOUS
  Filled 2017-01-15 (×3): qty 1

## 2017-01-15 MED ORDER — INSULIN ASPART 100 UNIT/ML ~~LOC~~ SOLN
3.0000 [IU] | Freq: Three times a day (TID) | SUBCUTANEOUS | Status: DC
  Administered 2017-01-15: 3 [IU] via SUBCUTANEOUS
  Filled 2017-01-15: qty 1

## 2017-01-15 MED ORDER — POLYETHYLENE GLYCOL 3350 17 G PO PACK
17.0000 g | PACK | Freq: Every day | ORAL | Status: DC
  Administered 2017-01-15 – 2017-01-19 (×5): 17 g via ORAL
  Filled 2017-01-15 (×6): qty 1

## 2017-01-15 MED ORDER — INSULIN DETEMIR 100 UNIT/ML ~~LOC~~ SOLN: 80.0000 [IU] | Freq: Two times a day (BID) | SUBCUTANEOUS | Status: DC

## 2017-01-15 MED ORDER — IPRATROPIUM-ALBUTEROL 0.5-2.5 (3) MG/3ML IN SOLN
3.0000 mL | Freq: Four times a day (QID) | RESPIRATORY_TRACT | Status: DC
Start: 2017-01-15 — End: 2017-01-20
  Administered 2017-01-15 – 2017-01-20 (×18): 3 mL via RESPIRATORY_TRACT
  Filled 2017-01-15 (×19): qty 3

## 2017-01-15 MED ORDER — INSULIN REGULAR HUMAN 100 UNIT/ML IJ SOLN
30.0000 [IU] | Freq: Once | INTRAMUSCULAR | Status: AC
  Administered 2017-01-16: 30 [IU] via INTRAVENOUS
  Filled 2017-01-15: qty 0.3

## 2017-01-15 MED ORDER — INSULIN REGULAR HUMAN 100 UNIT/ML IJ SOLN
15.0000 [IU] | Freq: Once | INTRAMUSCULAR | Status: AC
  Administered 2017-01-15: 15 [IU] via INTRAVENOUS
  Filled 2017-01-15: qty 0.15

## 2017-01-15 MED ORDER — INSULIN DETEMIR 100 UNIT/ML ~~LOC~~ SOLN
80.0000 [IU] | Freq: Two times a day (BID) | SUBCUTANEOUS | Status: DC
  Administered 2017-01-16: 80 [IU] via SUBCUTANEOUS
  Filled 2017-01-15 (×2): qty 0.8

## 2017-01-15 NOTE — Progress Notes (Signed)
CBG=418 mg/dL, Dr. Jannifer Franklin notified with a new order for IV regular insulin of 30 units and Levemir increased from 40 units to 60 units. and will recheck at 2300.

## 2017-01-15 NOTE — Progress Notes (Signed)
PHARMACIST - PHYSICIAN COMMUNICATION  CONCERNING: Antibiotic IV to Oral Route Change Policy  RECOMMENDATION: This patient is receiving AZITHROMYCIN by the intravenous route.  Based on criteria approved by the Pharmacy and Therapeutics Committee, the antibiotic(s) is/are being converted to the equivalent oral dose form(s).   DESCRIPTION: These criteria include:  Patient being treated for a respiratory tract infection, urinary tract infection, cellulitis or clostridium difficile associated diarrhea if on metronidazole  The patient is not neutropenic and does not exhibit a GI malabsorption state  The patient is eating (either orally or via tube) and/or has been taking other orally administered medications for a least 24 hours  The patient is improving clinically and has a Tmax < 100.5  If you have questions about this conversion, please contact the Pharmacy Department  []   [phone] )  [facility] [x]   [phone] )  [facility] []   [phone] )  [facility] []   [phone] )  [facility] []   [phone] )  [place] PharmD Clinical Pharmacist 01/15/2017

## 2017-01-15 NOTE — Progress Notes (Signed)
CBG at 03:30 was 401 mg/dL. Patient is still asymptomatic. Dr. Marcille Blanco notified without any new order. Will continue to monitor.

## 2017-01-15 NOTE — Progress Notes (Signed)
[place] CBG=461 mg/dL. Dr. Jannifer Franklin notified with a new order for another 15 units of IV regular insulin. Order implement and per Dr. Jannifer Franklin recheck at 39. Patient is asymptomatic. Will continue to monitor.

## 2017-01-15 NOTE — Progress Notes (Signed)
Pt blood sugar was at [place] was notified and ordered to just give 20 units scheduled of the sliding scale coverage and 5 units for the fixed schedule insulin. Will continue to monitor

## 2017-01-15 NOTE — Progress Notes (Signed)
Pt states he doesn't feel any air coming thru mask and insisting on removing mask, pt agreed to wear during treatment then take mask off. Pt placed back on 45% venti-mask

## 2017-01-15 NOTE — Progress Notes (Signed)
[place] at [place] NAME: Raymond [name]    MR#:  [id number]  DATE OF BIRTH:  [birth year]  SUBJECTIVE:   Blood sugars were elevated all throughout the day yesterday. He reports he takes 89 units twice a day of long-acting insulin. Shortness of breath has improved somewhat. Still has a cough.   REVIEW OF SYSTEMS:    Review of Systems  Constitutional: Negative for fever, chills weight loss HENT: Negative for ear pain, nosebleeds, congestion, facial swelling, rhinorrhea, neck pain, neck stiffness and ear discharge.   Respiratory: ++ for cough, shortness of breath, NO wheezing  Cardiovascular: Negative for chest pain, palpitations and leg swelling.  Gastrointestinal: Negative for heartburn, abdominal pain, vomiting, diarrhea ++consitpation Genitourinary: Negative for dysuria, urgency, frequency, hematuria Musculoskeletal: Negative for back pain or joint pain Neurological: Negative for dizziness, seizures, syncope, focal weakness,  numbness and headaches.  Hematological: Does not bruise/bleed easily.  Psychiatric/Behavioral: Negative for hallucinations, confusion, dysphoric mood    Tolerating Diet: yes      DRUG ALLERGIES:  No Known Allergies  VITALS:  Blood pressure (!) 104/48, pulse 60, temperature 98.3 F (36.8 C), resp. rate 19, height 6\' 2"  (1.88 m), weight (!) 169.2 kg (373 lb 1.6 oz), SpO2 92 %.  PHYSICAL EXAMINATION:  Constitutional: morbid obesity. No distress. HENT: Normocephalic. Marland Kitchen Oropharynx is clear and moist.  Eyes: Conjunctivae and EOM are normal. PERRLA, no scleral icterus.  Neck: Normal ROM. Neck supple. No JVD. No tracheal deviation. CVS: RRR, S1/S2 +, no murmurs, no gallops, no carotid bruit.  Pulmonary: Effort normal, diminished throughoutno stridor, rhonchi, wheezes, rales.  Abdominal: Soft. BS +,  no distension, tenderness, rebound or guarding.  Musculoskeletal: Normal range of motion. No edema and no  tenderness.  Neuro: Alert. CN 2-12 grossly intact. No focal deficits. Skin: Skin is warm and dry. No rash noted. Psychiatric: Normal mood and affect.      LABORATORY PANEL:   CBC Recent Labs  Lab 01/14/17 0356  WBC 5.1  HGB 10.9*  HCT 33.1*  PLT 250   ------------------------------------------------------------------------------------------------------------------  Chemistries  Recent Labs  Lab 01/15/17 0435  NA 134*  K 5.0  CL 102  CO2 24  GLUCOSE 407*  BUN 47*  CREATININE 1.55*  CALCIUM 8.6*   ------------------------------------------------------------------------------------------------------------------  Cardiac Enzymes Recent Labs  Lab 01/13/17 1524 01/13/17 2111 01/14/17 0356  TROPONINI <0.03 0.03* <0.03   ------------------------------------------------------------------------------------------------------------------  RADIOLOGY:  Dg Chest Port 1 View  Result Date: 01/13/2017 CLINICAL DATA:  Shortness of breath over the last day. EXAM: PORTABLE CHEST 1 VIEW COMPARISON:  10/03/2014 FINDINGS: There is right lower lobe and possibly right middle lobe pneumonia and collapse. Right upper lobe is clear. Left lung is clear. Heart size is normal. No visible effusion. No bone abnormality. IMPRESSION: Right lower lobe and possibly right middle lobe pneumonia and collapse. Electronically Signed   By: Nelson Chimes M.D.   On: 01/13/2017 12:27     ASSESSMENT AND PLAN:    51 year-old morbidly obese male with history of diastolic heart failure and chronic respiratory failure on 2 L of oxygen at night, OSA on CPAP and diabetes who presents shortness of breath and cough.  1. Acute hypoxic respiratory failure on chronic hypoxic respiratory failure due to community-acquired pneumonia: Patient will transition from Venturi mask nonrebreather and hopefully to nasal cannula at some point today  Wean IV steroids and continue nebulizer treatments  2. Community-acquired  pneumonia: Continue Rocephin and azithromycin Follow-up on final blood  culture  3. Hyperkalemia: Treated with insulin and dextrose and resolved.  4. Chronic diastolic heart failure without signs of exacerbation: Continue Lasix  5. Diabetes: Increase dose of Levemir and NovoLog Continue ADA diet and sliding scale  6. Essential hypertension: Continue enalapril, Coreg  7. Morbid obesity: Patient encouraged to lose weight   8. Constipation: Add stool softeners  9. OSA: Continue CPAP at night Management plans discussed with the patient and he is in agreement.  CODE STATUS: full  TOTAL TIME TAKING CARE OF THIS PATIENT: 24 minutes.     POSSIBLE D/C 1-2 days, DEPENDING ON CLINICAL CONDITION.   Raymond [name] M.D on 01/15/2017 at 10:56 AM  Between 7am to 6pm - Pager - [phone] After 6pm go to [email or web address] - password [email or web address] [place]  Office  [phone]  CC: Primary care physician; Center, [facility]  Note: This dictation was prepared with Dragon dictation along with smaller phrase technology. Any transcriptional errors that result from this process are unintentional.

## 2017-01-16 ENCOUNTER — Inpatient Hospital Stay: Payer: Medicare PPO

## 2017-01-16 LAB — BASIC METABOLIC PANEL
Anion gap: 7 (ref 5–15)
BUN: 39 mg/dL — ABNORMAL HIGH (ref 6–20)
CO2: 26 mmol/L (ref 22–32)
Calcium: 8.7 mg/dL — ABNORMAL LOW (ref 8.9–10.3)
Chloride: 105 mmol/L (ref 101–111)
Creatinine, Ser: 1.35 mg/dL — ABNORMAL HIGH (ref 0.61–1.24)
GFR calc Af Amer: >60 mL/min (ref >60)
GFR calc non Af Amer: 58 mL/min — ABNORMAL LOW (ref >60)
Glucose, Bld: 272 mg/dL — ABNORMAL HIGH (ref 65–99)
Potassium: 4.5 mmol/L (ref 3.5–5.1)
Sodium: 138 mmol/L (ref 135–145)

## 2017-01-16 LAB — GLUCOSE, CAPILLARY
Glucose-Capillary: 220 mg/dL — ABNORMAL HIGH (ref 65–99)
Glucose-Capillary: 269 mg/dL — ABNORMAL HIGH (ref 65–99)
Glucose-Capillary: 332 mg/dL — ABNORMAL HIGH (ref 65–99)
Glucose-Capillary: 378 mg/dL — ABNORMAL HIGH (ref 65–99)
Glucose-Capillary: 387 mg/dL — ABNORMAL HIGH (ref 65–99)

## 2017-01-16 MED ORDER — INSULIN ASPART 100 UNIT/ML ~~LOC~~ SOLN
40.0000 [IU] | Freq: Three times a day (TID) | SUBCUTANEOUS | Status: DC
  Administered 2017-01-17 – 2017-01-20 (×11): 40 [IU] via SUBCUTANEOUS
  Filled 2017-01-16 (×11): qty 1

## 2017-01-16 MED ORDER — ENOXAPARIN SODIUM 40 MG/0.4ML ~~LOC~~ SOLN
40.0000 mg | Freq: Two times a day (BID) | SUBCUTANEOUS | Status: DC
  Administered 2017-01-16 – 2017-01-20 (×8): 40 mg via SUBCUTANEOUS
  Filled 2017-01-16 (×8): qty 0.4

## 2017-01-16 MED ORDER — INSULIN ASPART 100 UNIT/ML ~~LOC~~ SOLN
7.0000 [IU] | Freq: Three times a day (TID) | SUBCUTANEOUS | Status: DC
  Administered 2017-01-16 (×2): 7 [IU] via SUBCUTANEOUS
  Filled 2017-01-16 (×2): qty 1

## 2017-01-16 MED ORDER — INSULIN DETEMIR 100 UNIT/ML ~~LOC~~ SOLN
90.0000 [IU] | Freq: Two times a day (BID) | SUBCUTANEOUS | Status: DC
  Administered 2017-01-16 – 2017-01-17 (×2): 90 [IU] via SUBCUTANEOUS
  Filled 2017-01-16 (×3): qty 0.9

## 2017-01-16 MED ORDER — FUROSEMIDE 10 MG/ML IJ SOLN
40.0000 mg | Freq: Two times a day (BID) | INTRAMUSCULAR | Status: DC
  Administered 2017-01-16 – 2017-01-20 (×8): 40 mg via INTRAVENOUS
  Filled 2017-01-16 (×8): qty 4

## 2017-01-16 NOTE — Plan of Care (Signed)
  Progressing Clinical Measurements: Will remain free from infection 01/16/2017 1135 - Progressing by Liliane Channel, RN Respiratory complications will improve 01/16/2017 1135 - Progressing by Liliane Channel, RN Cardiovascular complication will be avoided 01/16/2017 1135 - Progressing by Liliane Channel, RN Safety: Ability to remain free from injury will improve 01/16/2017 1135 - Progressing by Liliane Channel, RN

## 2017-01-16 NOTE — Progress Notes (Signed)
Patient has refused cpap for the night 

## 2017-01-16 NOTE — Progress Notes (Addendum)
CBG at 2300 was 323 mg/dL. Dr. Jannifer Franklin notified with a new order for another 30 units of  IV regular Insulin. Patient is still asymptomatic. Order implemented and will recheck and continue to monitor. Dr. Jannifer Franklin also adjusted patient's Levemir to 80 mg BID.

## 2017-01-16 NOTE — Care Management Important Message (Signed)
Important Message  Patient Details  Name: Raymond [name] MRN: [id number] Date of Birth: [birth year]   Medicare Important Message Given:  Yes    Marshell Garfinkel, RN 01/16/2017, 8:53 AM

## 2017-01-16 NOTE — Progress Notes (Signed)
Pt Bp was at 118/48 and HR at 58. Docotr Posey Pronto was notified and ordered to hold coreg and enalapril Maleate> Docotr states its okay to aadministered lasix.. Will continue to monitor.

## 2017-01-16 NOTE — Progress Notes (Signed)
Pharmacy Antibiotic Note  Raymond [name] is a 51 y.o. male admitted on 01/13/2017 with community aquired pneumonia.  Pharmacy has been consulted for Ceftriaxone dosing.  Plan: Continue Ceftriaxone 1 g IV q24 hours.  Height: 6\' 2"  (188 cm) Weight: (!) 370 lb 14.4 oz (168.2 kg) IBW/kg (Calculated) : 82.2  Temp (24hrs), Avg:98 F (36.7 C), Min:97.8 F (36.6 C), Max:98.2 F (36.8 C)  Recent Labs  Lab 01/13/17 1149 01/13/17 1150 01/13/17 1246 01/14/17 0356 01/15/17 0435 01/16/17 0519  WBC 6.3  --   --  5.1  --   --   CREATININE 2.20*  --   --  1.56* 1.55* 1.35*  LATICACIDVEN  --  1.8 1.3  --   --   --     Estimated Creatinine Clearance: 104.4 mL/min (A) (by C-G formula based on SCr of 1.35 mg/dL (H)).    No Known Allergies  Antimicrobials this admission: Levofloxacin 12/14 >> once  Ceftriaxone 12/15 >> Azithromycin 12/15 >>  Dose adjustments this admission:   Microbiology results: BCx: 12/15 >> sent   Thank you for allowing pharmacy to be a part of this patient's care.  Larene Beach, PharmD  01/16/2017 11:17 AM

## 2017-01-16 NOTE — Progress Notes (Signed)
Pt refused to wear Cpap tonight. Pt on HFNC with no distress. Will continue to monitor pt.

## 2017-01-16 NOTE — Progress Notes (Signed)
[place] at [place] NAME: Raymond [name]    MR#:  [id number]  DATE OF BIRTH:  [birth year]  SUBJECTIVE:   Patient on high flow nasal cannula Continues to be short of breath  REVIEW OF SYSTEMS:    Review of Systems  Constitutional: Negative for fever, chills weight loss HENT: Negative for ear pain, nosebleeds, congestion, facial swelling, rhinorrhea, neck pain, neck stiffness and ear discharge.   Respiratory: ++ for cough, shortness of breath, NO wheezing  Cardiovascular: Negative for chest pain, palpitations and leg swelling.  Gastrointestinal: Negative for heartburn, abdominal pain, vomiting, diarrhea ++consitpation Genitourinary: Negative for dysuria, urgency, frequency, hematuria Musculoskeletal: Negative for back pain or joint pain Neurological: Negative for dizziness, seizures, syncope, focal weakness,  numbness and headaches.  Hematological: Does not bruise/bleed easily.  Psychiatric/Behavioral: Negative for hallucinations, confusion, dysphoric mood    Tolerating Diet: yes      DRUG ALLERGIES:  No Known Allergies  VITALS:  Blood pressure (!) 118/48, pulse (!) 56, temperature 97.8 F (36.6 C), temperature source Oral, resp. rate 18, height 6\' 2"  (1.88 m), weight (!) 370 lb 14.4 oz (168.2 kg), SpO2 94 %.  PHYSICAL EXAMINATION:  Constitutional: morbid obesity. No distress. HENT: Normocephalic. Marland Kitchen Oropharynx is clear and moist.  Eyes: Conjunctivae and EOM are normal. PERRLA, no scleral icterus.  Neck: Normal ROM. Neck supple. No JVD. No tracheal deviation. CVS: RRR, S1/S2 +, no murmurs, no gallops, no carotid bruit.  Pulmonary: Effort normal, diminished throughoutno stridor, rhonchi, wheezes, rales.  Abdominal: Soft. BS +,  no distension, tenderness, rebound or guarding.  Musculoskeletal: Normal range of motion. No edema and no tenderness.  Neuro: Alert. CN 2-12 grossly intact. No focal deficits. Skin: Skin is warm and  dry. No rash noted. Psychiatric: Normal mood and affect.      LABORATORY PANEL:   CBC Recent Labs  Lab 01/14/17 0356  WBC 5.1  HGB 10.9*  HCT 33.1*  PLT 250   ------------------------------------------------------------------------------------------------------------------  Chemistries  Recent Labs  Lab 01/16/17 0519  NA 138  K 4.5  CL 105  CO2 26  GLUCOSE 272*  BUN 39*  CREATININE 1.35*  CALCIUM 8.7*   ------------------------------------------------------------------------------------------------------------------  Cardiac Enzymes Recent Labs  Lab 01/13/17 1524 01/13/17 2111 01/14/17 0356  TROPONINI <0.03 0.03* <0.03   ------------------------------------------------------------------------------------------------------------------  RADIOLOGY:  Ct Chest Wo Contrast  Result Date: 01/16/2017 CLINICAL DATA:  Pneumonia. EXAM: CT CHEST WITHOUT CONTRAST TECHNIQUE: Multidetector CT imaging of the chest was performed following the standard protocol without IV contrast. COMPARISON:  Chest x-ray 01/13/2017 and prior chest CT 03/30/2014 FINDINGS: Cardiovascular: The heart is normal in size and stable. No pericardial effusion. Small amount of pericardial fluid. The aorta is normal in caliber. No atherosclerotic calcifications. Mediastinum/Nodes: Small scattered mediastinal and hilar lymph nodes but no mass or overt adenopathy. The esophagus is grossly normal. Lungs/Pleura: There are patchy bilateral infiltrates most significant in the right lower lobe. No worrisome pulmonary lesions or associated pleural effusion. The tracheobronchial tree is grossly normal. No endobronchial lesion. Upper Abdomen: No significant upper abdominal findings. Musculoskeletal: No chest wall mass, supraclavicular or axillary adenopathy. The thyroid gland is grossly normal. No significant bony findings. Benign-appearing sclerotic lesion in the T3 vertebral body. IMPRESSION: Bilateral pulmonary  infiltrates. This is most significant in the right lower lobe. No worrisome pulmonary lesions. Electronically Signed   By: Marijo Sanes M.D.   On: 01/16/2017 11:51     ASSESSMENT AND PLAN:    51 year-old morbidly  obese male with history of diastolic heart failure and chronic respiratory failure on 2 L of oxygen at night, OSA on CPAP and diabetes who presents shortness of breath and cough.  1. Acute hypoxic respiratory failure on chronic hypoxic respiratory failure due to community-acquired pneumonia:  CT scan of the chest ordered shows bilateral pulmonary infiltrates I will have pulmonary see the patient since no significant improvement Due to bilateral infiltrate we will try IV Lasix to see if that helps  2. Community-acquired pneumonia: Continue Rocephin and azithromycin  3. Hyperkalemia: Treated with insulin and dextrose and resolved.  4. Chronic diastolic heart failure possible exacerbation: IV Lasix as above  5. Diabetes: Increase dose of Levemir and NovoLog blood sugar still very high Continue ADA diet and sliding scale  6. Essential hypertension: Continue enalapril, Coreg  7. Morbid obesity: Patient encouraged to lose weight   8. Constipation: Add stool softeners  9. OSA: Continue CPAP at night Management plans discussed with the patient and he is in agreement.  CODE STATUS: full  TOTAL TIME TAKING CARE OF THIS PATIENT: 24 minutes.     POSSIBLE D/C 1-2 days, DEPENDING ON CLINICAL CONDITION.   Dustin Flock M.D on 01/16/2017 at 6:33 PM  Between 7am to 6pm - Pager - [phone] After 6pm go to [email or web address] - password [email or web address] [facility]  Office  [phone]  CC: Primary care physician; Center, [facility]  Note: This dictation was prepared with Dragon dictation along with smaller phrase technology. Any transcriptional errors that result from this process are unintentional.

## 2017-01-16 NOTE — Care Management Note (Addendum)
Case Management Note  Patient Details  Name: Raymond [name] MRN: [id number] Date of Birth: 22-May-1965  Subjective/Objective:                   Met with patient to discuss transition of care. He states he is independent with mobility and able to drive. He states he has transportation to home when needed. He is chronically on home O2 through [place]. He agrees to follow up appointment next Wednesday at [facility]. 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  He is currently on HFO2.    Action/Plan: RNCM to continue to follow. Referral to [facility] center.   Expected Discharge Date:  01/15/17               Expected Discharge Plan:     In-House Referral:     Discharge planning Services  CM Consult, Follow-up appt scheduled  Post Acute Care Choice:    Choice offered to:  Patient  DME Arranged:    DME Agency:     HH Arranged:    [place] Agency:     Status of Service:  In process, will continue to follow  If discussed at Long Length of Stay Meetings, dates discussed:    Additional Comments:  Marshell Garfinkel, RN 01/16/2017, 10:47 AM

## 2017-01-16 NOTE — Progress Notes (Signed)
Pt refusing bed alarm but was educated abput safety.

## 2017-01-16 NOTE — Progress Notes (Signed)
Blood sugar was at 378 as per tech Raymond [name]). Glucometer is not sinking yet.

## 2017-01-16 NOTE — Progress Notes (Signed)
Inpatient Diabetes Program Recommendations  AACE/ADA: New Consensus Statement on Inpatient Glycemic Control (2015)  Target Ranges:  Prepandial:   less than 140 mg/dL      Peak postprandial:   less than 180 mg/dL (1-2 hours)      Critically ill patients:  140 - 180 mg/dL   Lab Results  Component Value Date   GLUCAP 269 (H) 01/16/2017   HGBA1C 6.6 (H) 09/30/2014   Review of Glycemic Control  Diabetes history: DM 2 Outpatient Diabetes medications: Levemir 89 units bid, Humalog 60 units tid, Metformin 1000 mg bid Current orders for Inpatient glycemic control: Levemir 80 units bid, Novolog Resistant 0-20 units tid + Novolog 5 units tid meal coverage  Inpatient Diabetes Program Recommendations:    Glucose elevated this am 269 mg/dl. However patient only got 1 dose of Levemir 60 units yesterday at 2107. The morning dose of Levemir was 25 units at 1004 am. Consider reducing Levemir back down to 60 units BID and monitor trends with second dose this am. If postprandial glucose levels increase could increase Novolog Meal coverage.  Thanks,  Tama Headings RN, MSN, [facility] Inpatient Diabetes Coordinator Team Pager [phone] (8a-5p)

## 2017-01-17 DIAGNOSIS — J181 Lobar pneumonia, unspecified organism: Secondary | ICD-10-CM

## 2017-01-17 LAB — RESPIRATORY PANEL BY PCR

## 2017-01-17 LAB — CBC
HCT: 33.9 % — ABNORMAL LOW (ref 40.0–52.0)
Hemoglobin: 11.1 g/dL — ABNORMAL LOW (ref 13.0–18.0)
MCH: 26.7 pg (ref 26.0–34.0)
MCHC: 32.7 g/dL (ref 32.0–36.0)
MCV: 81.9 fL (ref 80.0–100.0)
Platelets: 356 10*3/uL (ref 150–440)
RBC: 4.14 MIL/uL — ABNORMAL LOW (ref 4.40–5.90)
RDW: 15.5 % — ABNORMAL HIGH (ref 11.5–14.5)
WBC: 8.1 10*3/uL (ref 3.8–10.6)

## 2017-01-17 LAB — BASIC METABOLIC PANEL
Anion gap: 7 (ref 5–15)
BUN: 32 mg/dL — ABNORMAL HIGH (ref 6–20)
CO2: 27 mmol/L (ref 22–32)
Calcium: 9.1 mg/dL (ref 8.9–10.3)
Chloride: 102 mmol/L (ref 101–111)
Creatinine, Ser: 1.09 mg/dL (ref 0.61–1.24)
GFR calc Af Amer: >60 mL/min (ref >60)
GFR calc non Af Amer: >60 mL/min (ref >60)
Glucose, Bld: 322 mg/dL — ABNORMAL HIGH (ref 65–99)
Potassium: 4.5 mmol/L (ref 3.5–5.1)
Sodium: 136 mmol/L (ref 135–145)

## 2017-01-17 LAB — GLUCOSE, CAPILLARY
Glucose-Capillary: 286 mg/dL — ABNORMAL HIGH (ref 65–99)
Glucose-Capillary: 297 mg/dL — ABNORMAL HIGH (ref 65–99)
Glucose-Capillary: 351 mg/dL — ABNORMAL HIGH (ref 65–99)
Glucose-Capillary: 251 mg/dL — ABNORMAL HIGH (ref 65–99)
Glucose-Capillary: 333 mg/dL — ABNORMAL HIGH (ref 65–99)
Glucose-Capillary: 323 mg/dL — ABNORMAL HIGH (ref 65–99)

## 2017-01-17 LAB — MRSA PCR SCREENING: MRSA by PCR: NEGATIVE

## 2017-01-17 MED ORDER — PREDNISOLONE 5 MG PO TABS: 50.0000 mg | ORAL_TABLET | Freq: Every day | ORAL | Status: DC

## 2017-01-17 MED ORDER — PREDNISONE 50 MG PO TABS
50.0000 mg | ORAL_TABLET | Freq: Every day | ORAL | Status: DC
  Administered 2017-01-17 – 2017-01-18 (×2): 50 mg via ORAL
  Filled 2017-01-17 (×2): qty 1

## 2017-01-17 MED ORDER — PIPERACILLIN-TAZOBACTAM 3.375 G IVPB
3.3750 g | Freq: Three times a day (TID) | INTRAVENOUS | Status: DC
Start: 2017-01-17 — End: 2017-01-19
  Administered 2017-01-17 – 2017-01-19 (×6): 3.375 g via INTRAVENOUS
  Filled 2017-01-17 (×6): qty 50

## 2017-01-17 MED ORDER — INSULIN DETEMIR 100 UNIT/ML ~~LOC~~ SOLN
100.0000 [IU] | Freq: Two times a day (BID) | SUBCUTANEOUS | Status: DC
  Administered 2017-01-17 – 2017-01-20 (×6): 100 [IU] via SUBCUTANEOUS
  Filled 2017-01-17 (×8): qty 1

## 2017-01-17 NOTE — Progress Notes (Signed)
[place] [facility] [place] NAME: Raymond [name]    MR#:  [id number]  DATE OF BIRTH:  1965-10-17  SUBJECTIVE:   Continues to be on high flow nasal cannula  REVIEW OF SYSTEMS:    Review of Systems  Constitutional: Negative for fever, chills weight loss HENT: Negative for ear pain, nosebleeds, congestion, facial swelling, rhinorrhea, neck pain, neck stiffness and ear discharge.   Respiratory: ++ for cough, shortness of breath, NO wheezing  Cardiovascular: Negative for chest pain, palpitations and leg swelling.  Gastrointestinal: Negative for heartburn, abdominal pain, vomiting, diarrhea ++consitpation Genitourinary: Negative for dysuria, urgency, frequency, hematuria Musculoskeletal: Negative for back pain or joint pain Neurological: Negative for dizziness, seizures, syncope, focal weakness,  numbness and headaches.  Hematological: Does not bruise/bleed easily.  Psychiatric/Behavioral: Negative for hallucinations, confusion, dysphoric mood    Tolerating Diet: yes      DRUG ALLERGIES:  No Known Allergies  VITALS:  Blood pressure (!) 115/42, pulse (!) 52, temperature 98.1 F (36.7 C), temperature source Oral, resp. rate 14, height 6\' 2"  (1.88 m), weight (!) 364 lb 4.8 oz (165.2 kg), SpO2 96 %.  PHYSICAL EXAMINATION:  Constitutional: morbid obesity. No distress. HENT: Normocephalic. Marland Kitchen Oropharynx is clear and moist.  Eyes: Conjunctivae and EOM are normal. PERRLA, no scleral icterus.  Neck: Normal ROM. Neck supple. No JVD. No tracheal deviation. CVS: RRR, S1/S2 +, no murmurs, no gallops, no carotid bruit.  Pulmonary: Effort normal, diminished throughoutno stridor, rhonchi, wheezes, rales.  Abdominal: Soft. BS +,  no distension, tenderness, rebound or guarding.  Musculoskeletal: Normal range of motion. No edema and no tenderness.  Neuro: Alert. CN 2-12 grossly intact. No focal deficits. Skin: Skin is warm and dry. No rash  noted. Psychiatric: Normal mood and affect.      LABORATORY PANEL:   CBC Recent Labs  Lab 01/17/17 0330  WBC 8.1  HGB 11.1*  HCT 33.9*  PLT 356   ------------------------------------------------------------------------------------------------------------------  Chemistries  Recent Labs  Lab 01/17/17 0330  NA 136  K 4.5  CL 102  CO2 27  GLUCOSE 322*  BUN 32*  CREATININE 1.09  CALCIUM 9.1   ------------------------------------------------------------------------------------------------------------------  Cardiac Enzymes Recent Labs  Lab 01/13/17 1524 01/13/17 2111 01/14/17 0356  TROPONINI <0.03 0.03* <0.03   ------------------------------------------------------------------------------------------------------------------  RADIOLOGY:  Ct Chest Wo Contrast  Result Date: 01/16/2017 CLINICAL DATA:  Pneumonia. EXAM: CT CHEST WITHOUT CONTRAST TECHNIQUE: Multidetector CT imaging of the chest was performed following the standard protocol without IV contrast. COMPARISON:  Chest x-ray 01/13/2017 and prior chest CT 03/30/2014 FINDINGS: Cardiovascular: The heart is normal in size and stable. No pericardial effusion. Small amount of pericardial fluid. The aorta is normal in caliber. No atherosclerotic calcifications. Mediastinum/Nodes: Small scattered mediastinal and hilar lymph nodes but no mass or overt adenopathy. The esophagus is grossly normal. Lungs/Pleura: There are patchy bilateral infiltrates most significant in the right lower lobe. No worrisome pulmonary lesions or associated pleural effusion. The tracheobronchial tree is grossly normal. No endobronchial lesion. Upper Abdomen: No significant upper abdominal findings. Musculoskeletal: No chest wall mass, supraclavicular or axillary adenopathy. The thyroid gland is grossly normal. No significant bony findings. Benign-appearing sclerotic lesion in the T3 vertebral body. IMPRESSION: Bilateral pulmonary infiltrates. This is  most significant in the right lower lobe. No worrisome pulmonary lesions. Electronically Signed   By: Marijo Sanes M.D.   On: 01/16/2017 11:51     ASSESSMENT AND PLAN:    51 year-old morbidly obese male with history  of diastolic heart failure and chronic respiratory failure on 2 L of oxygen at night, OSA on CPAP and diabetes who presents shortness of breath and cough.  1. Acute hypoxic respiratory failure on chronic hypoxic respiratory failure due to community-acquired pneumonia:  CT scan of the chest ordered shows bilateral pulmonary infiltrates Appreciate pulmonary input   2. Community-acquired pneumonia: Continue with Zosyn   3. Hyperkalemia: Treated with insulin and dextrose and resolved.  4.  Acute on chronic diastolic CHF exacerbation patient is responding to IV Lasix  5. Diabetes:  Blood sugars continued to be high will adjust his insulin  6. Essential hypertension: Continue enalapril, Coreg  7. Morbid obesity: Patient encouraged to lose weight   8. Constipation: Add stool softeners  9. OSA: Continue CPAP at night Management plans discussed with the patient and he is in agreement.  CODE STATUS: full  TOTAL TIME TAKING CARE OF THIS PATIENT: 24 minutes.     POSSIBLE D/C 1-2 days, DEPENDING ON CLINICAL CONDITION.   Dustin Flock M.D on 01/17/2017 at 3:26 PM  Between 7am to 6pm - Pager - [phone] After 6pm go to [email or web address] - password [email or web address] [facility]  Office  [phone]  CC: Primary care physician; Center, [facility]  Note: This dictation was prepared with Dragon dictation along with smaller phrase technology. Any transcriptional errors that result from this process are unintentional.

## 2017-01-17 NOTE — Consult Note (Signed)
[facility] Consultation      Assessment and Plan:  Acute hypoxic respiratory failure with pneumonia, complicated by possible pulmonary edema with volume overload. -Continue diuresis. -We will broaden antibiotic coverage to include gram negatives, will change ceftriaxone to Zosyn. - MRSA screen ordered.  Chronic hypoxic respiratory failure with obesity hypoventilation syndrome, severe obstructive sleep apnea and and restrictive lung disease. -Known severe obstructive sleep apnea with an AHI of 106, has been on home trilogy device. He has been intolerant of CPAP while inpatient.  - Asked that the patient have his family bring in his home trilogy device so that he can use it while he is inpatient.   Date: 01/17/2017  MRN# [id number] Raymond [name] 53-11-1965  Referring Physician: Dr. Valinda Hoar [name] is a 51 y.o. old male seen in consultation for chief complaint of:    Chief Complaint  Patient presents with  . Shortness of Breath    HPI:   The patient is a 51 year old male whom I have seen in the office, he has a history of obstructive sleep apnea, obesity hypoventilation syndrome on a trilogy device, also has a history of congestive heart failure with preserved ejection fraction. He presented to the emergency room on 01/13/17 with dyspnea, diarrhea.  Patient normally uses 2 L at home, he was started on 4-5 L in the emergency department.  Chest x-ray was performed at that time, images personally reviewed.  Chest x-ray findings consistent with pneumonia, predominantly in the right lower lobe, these findings were confirmed on CT chest 01/16/17, as well as some left lower lobe infiltrates as well. Patient also presented with acute on chronic kidney disease with creatinine of 1.56, this is now down to 1.09.  Influenza screen was negative, other cultures/testing was unremarkable other than elevated blood glucose levels.  The patient is on  50% high flow nasal cannula with oxygen saturation of 100%.  Antimicrobials this admission: Levofloxacin 12/14 >> once  Ceftriaxone 12/15 >> Azithromycin 12/15 >>12/18 Zosyn 12/18>>  Microbiology results: BCx: 12/15 >> sent Mrsa screen 12/18; pending.    PMHX:   Past Medical History:  Diagnosis Date  . Arthritis    hands  . Cellulitis   . CHF (congestive heart failure) ([place])   . Chronic respiratory failure with hypoxia (HCC)    2 L [place] continuously  . Diabetes ([place])   . Edema    FEET/LEGS  . GERD (gastroesophageal reflux disease)   . Hepatic steatosis   . HOH (hard of hearing)   . Hypertension   . Morbid obesity ([place])   . Neuropathy   . Orthopnea   . OSA on CPAP    TRILOGY VENTILATOR  . Oxygen deficiency    2L  HS  . PVD (peripheral vascular disease) ([place])   . Shortness of breath dyspnea   . Systolic heart failure (HCC)    Preserved EF 50-55%   Surgical Hx:  Past Surgical History:  Procedure Laterality Date  . AMPUTATION Right 10/02/2014   Procedure: AMPUTATION RAY;  Surgeon: Albertine Patricia, DPM;  Location: [facility] ORS;  Service: Podiatry;  Laterality: Right;  . CATARACT EXTRACTION W/PHACO Right 09/10/2015   Procedure: CATARACT EXTRACTION PHACO AND INTRAOCULAR LENS PLACEMENT (IOC);  Surgeon: Birder Robson, MD;  Location: [facility] ORS;  Service: Ophthalmology;  Laterality: Right;  [place] 01:34 AP% 19.3 CDE 18.36 Fluid pack lot # [id number]  . CATARACT EXTRACTION W/PHACO Left 10/06/2015   Procedure: CATARACT EXTRACTION PHACO AND INTRAOCULAR LENS PLACEMENT (IOC);  Surgeon: Birder Robson, MD;  Location: [facility] ORS;  Service: Ophthalmology;  Laterality: Left;  [place] 00:56 AP% 19.5 CDE 11.02 Fluid Pack # [id number]  . CHOLECYSTECTOMY    . COLONOSCOPY WITH PROPOFOL N/A 05/17/2016   Procedure: COLONOSCOPY WITH PROPOFOL;  Surgeon: Lollie Sails, MD;  Location: [facility] ENDOSCOPY;  Service: Endoscopy;  Laterality: N/A;  . EYE SURGERY Left    bilat laser  . HIP SURGERY Right    8 th  grade pins  . NOSE SURGERY    . PERIPHERAL VASCULAR CATHETERIZATION Right 10/02/2014   Procedure: Lower Extremity Angiography;  Surgeon: Algernon Huxley, MD;  Location: [facility];  Service: Cardiovascular;  Laterality: Right;  . TOE AMPUTATION Left 2010   2cd  . TOE AMPUTATION Right 2009   Family Hx:  Family History  Problem Relation Age of Onset  . Hypertension Mother   . Diabetes Mother   . Stroke Father   . Hypertension Father   . Heart attack Father    Social Hx:   Social History   Tobacco Use  . Smoking status: Never Smoker  . Smokeless tobacco: Never Used  Substance Use Topics  . Alcohol use: No    Alcohol/week: 0.0 oz  . Drug use: No   Medication:    Current Facility-Administered Medications:  .  0.9 %  sodium chloride infusion, 250 mL, Intravenous, PRN, Pyreddy, Pavan, MD .  acetaminophen (TYLENOL) tablet 650 mg, 650 mg, Oral, Q6H PRN **OR** acetaminophen (TYLENOL) suppository 650 mg, 650 mg, Rectal, Q6H PRN, Pyreddy, Pavan, MD .  aspirin EC tablet 81 mg, 81 mg, Oral, Daily, Pyreddy, Pavan, MD, 81 mg at 01/17/17 0921 .  azithromycin (ZITHROMAX) tablet 500 mg, 500 mg, Oral, Daily, Mody, Sital, MD, 500 mg at 01/17/17 0921 .  carvedilol (COREG) tablet 25 mg, 25 mg, Oral, BID, Pyreddy, Pavan, MD, 25 mg at 01/17/17 0921 .  cefTRIAXone (ROCEPHIN) 1 g in dextrose 5 % 50 mL IVPB, 1 g, Intravenous, Q24H, Lifsey, Betti Cruz, RPH, Stopped at 01/17/17 1004 .  docusate sodium (COLACE) capsule 100 mg, 100 mg, Oral, BID, Mody, Sital, MD, 100 mg at 01/17/17 0920 .  enalapril (VASOTEC) tablet 40 mg, 40 mg, Oral, Daily, Pyreddy, Pavan, MD, 40 mg at 01/17/17 0921 .  enoxaparin (LOVENOX) injection 40 mg, 40 mg, Subcutaneous, Q12H, Dustin Flock, MD, 40 mg at 01/17/17 0921 .  furosemide (LASIX) injection 40 mg, 40 mg, Intravenous, Q12H, Dustin Flock, MD, 40 mg at 01/17/17 0919 .  gabapentin (NEURONTIN) capsule 300 mg, 300 mg, Oral, TID, Pyreddy, Pavan, MD, 300 mg at 01/17/17  0919 .  insulin aspart (novoLOG) injection 0-20 Units, 0-20 Units, Subcutaneous, TID WC, Bettey Costa, MD, 11 Units at 01/17/17 0911 .  insulin aspart (novoLOG) injection 40 Units, 40 Units, Subcutaneous, TID WC, Lance Coon, MD, 40 Units at 01/17/17 0912 .  insulin detemir (LEVEMIR) injection 90 Units, 90 Units, Subcutaneous, BID, Dustin Flock, MD, 90 Units at 01/17/17 0932 .  ipratropium-albuterol (DUONEB) 0.5-2.5 (3) MG/3ML nebulizer solution 3 mL, 3 mL, Nebulization, Q6H, Mody, Sital, MD, 3 mL at 01/17/17 0854 .  ipratropium-albuterol (DUONEB) 0.5-2.5 (3) MG/3ML nebulizer solution 3 mL, 3 mL, Nebulization, Q4H PRN, Mody, Sital, MD .  MEDLINE mouth rinse, 15 mL, Mouth Rinse, BID, Pyreddy, Pavan, MD, 15 mL at 01/17/17 0933 .  methylPREDNISolone sodium succinate (SOLU-MEDROL) 40 mg/mL injection 40 mg, 40 mg, Intravenous, Q24H, Mody, Sital, MD, 40 mg at 01/17/17 0921 .  ondansetron (ZOFRAN) tablet 4 mg, 4  mg, Oral, Q6H PRN **OR** ondansetron (ZOFRAN) injection 4 mg, 4 mg, Intravenous, Q6H PRN, Pyreddy, Pavan, MD .  oxyCODONE-acetaminophen (PERCOCET/ROXICET) 5-325 MG per tablet 1 tablet, 1 tablet, Oral, Q4H PRN, Pyreddy, Pavan, MD .  pantoprazole (PROTONIX) EC tablet 40 mg, 40 mg, Oral, Daily, Pyreddy, Pavan, MD, 40 mg at 01/17/17 0920 .  polyethylene glycol (MIRALAX / GLYCOLAX) packet 17 g, 17 g, Oral, Daily, Mody, Sital, MD, 17 g at 01/17/17 0922 .  senna (SENOKOT) tablet 8.6 mg, 1 tablet, Oral, Daily, Mody, Sital, MD, 8.6 mg at 01/17/17 0919 .  sodium chloride flush (NS) 0.9 % injection 3 mL, 3 mL, Intravenous, Q12H, Pyreddy, Pavan, MD, 3 mL at 01/17/17 0922 .  sodium chloride flush (NS) 0.9 % injection 3 mL, 3 mL, Intravenous, PRN, Saundra Shelling, MD   Allergies:  Patient has no known allergies.  Review of Systems: Gen:  Denies  fever, sweats, chills HEENT: Denies blurred vision, double vision. bleeds, sore throat Cvc:  No dizziness, chest pain. Resp:   Denies cough or sputum  production, shortness of breath Gi: Denies swallowing difficulty, stomach pain. Gu:  Denies bladder incontinence, burning urine Ext:   No Joint pain, stiffness. Skin: No skin rash,  hives  Endoc:  No polyuria, polydipsia. Psych: No depression, insomnia. Other:  All other systems were reviewed with the patient and were negative other that what is mentioned in the HPI.   Physical Examination:   VS: BP (!) 115/42 (BP Location: Left Arm)   Pulse (!) 52   Temp 98.1 F (36.7 C) (Oral)   Resp 14   Ht 6\' 2"  (1.88 m)   Wt (!) 364 lb 4.8 oz (165.2 kg)   SpO2 100%   BMI 46.77 kg/m   General Appearance: No distress  Neuro:without focal findings,  speech normal,  HEENT: PERRLA, EOM intact.   Pulmonary: normal breath sounds, No wheezing.  CardiovascularNormal S1,S2.  No m/r/g.   Abdomen: Benign, Soft, non-tender. Renal:  No costovertebral tenderness  GU:  No performed at this time. Endoc: No evident thyromegaly, no signs of acromegaly. Skin:   warm, no rashes, no ecchymosis  Extremities: normal, no cyanosis, clubbing.  Other findings:    LABORATORY PANEL:   CBC Recent Labs  Lab 01/17/17 0330  WBC 8.1  HGB 11.1*  HCT 33.9*  PLT 356   ------------------------------------------------------------------------------------------------------------------  Chemistries  Recent Labs  Lab 01/17/17 0330  NA 136  K 4.5  CL 102  CO2 27  GLUCOSE 322*  BUN 32*  CREATININE 1.09  CALCIUM 9.1   ------------------------------------------------------------------------------------------------------------------  Cardiac Enzymes Recent Labs  Lab 01/14/17 0356  TROPONINI <0.03   ------------------------------------------------------------  RADIOLOGY:  Ct Chest Wo Contrast  Result Date: 01/16/2017 CLINICAL DATA:  Pneumonia. EXAM: CT CHEST WITHOUT CONTRAST TECHNIQUE: Multidetector CT imaging of the chest was performed following the standard protocol without IV contrast. COMPARISON:   Chest x-ray 01/13/2017 and prior chest CT 03/30/2014 FINDINGS: Cardiovascular: The heart is normal in size and stable. No pericardial effusion. Small amount of pericardial fluid. The aorta is normal in caliber. No atherosclerotic calcifications. Mediastinum/Nodes: Small scattered mediastinal and hilar lymph nodes but no mass or overt adenopathy. The esophagus is grossly normal. Lungs/Pleura: There are patchy bilateral infiltrates most significant in the right lower lobe. No worrisome pulmonary lesions or associated pleural effusion. The tracheobronchial tree is grossly normal. No endobronchial lesion. Upper Abdomen: No significant upper abdominal findings. Musculoskeletal: No chest wall mass, supraclavicular or axillary adenopathy. The thyroid gland is grossly normal.  No significant bony findings. Benign-appearing sclerotic lesion in the T3 vertebral body. IMPRESSION: Bilateral pulmonary infiltrates. This is most significant in the right lower lobe. No worrisome pulmonary lesions. Electronically Signed   By: Marijo Sanes M.D.   On: 01/16/2017 11:51       Thank  you for the consultation and for allowing [facility] to assist in the care of your patient. Our recommendations are noted above.  Please contact us if we can be of further service.   Marda Stalker, MD.  Board Certified in Internal Medicine, Pulmonary Medicine, [place], and Sleep Medicine.  [facility] Office Number: [phone]  Patricia Pesa, M.D.  Merton Border, M.D  01/17/2017

## 2017-01-17 NOTE — Consult Note (Signed)
Pharmacy Antibiotic Note  Raymond [name] is a 51 y.o. male admitted on 01/13/2017 with pneumonia.  Pharmacy has been consulted for zosyn dosing.  Plan: Zosyn 3.375g IV q8h (4 hour infusion).  Height: 6\' 2"  (188 cm) Weight: (!) 364 lb 4.8 oz (165.2 kg) IBW/kg (Calculated) : 82.2  Temp (24hrs), Avg:98.4 F (36.9 C), Min:98.1 F (36.7 C), Max:98.5 F (36.9 C)  Recent Labs  Lab 01/13/17 1149 01/13/17 1150 01/13/17 1246 01/14/17 0356 01/15/17 0435 01/16/17 0519 01/17/17 0330  WBC 6.3  --   --  5.1  --   --  8.1  CREATININE 2.20*  --   --  1.56* 1.55* 1.35* 1.09  LATICACIDVEN  --  1.8 1.3  --   --   --   --     Estimated Creatinine Clearance: 127.9 mL/min (by C-G formula based on SCr of 1.09 mg/dL).    No Known Allergies  Antimicrobials this admission: ceftriaxone 12/14 >> 12/18 azithromycin 12/14 >>  Zosyn  12/18>>  Dose adjustments this admission:   Microbiology results: Results for orders placed or performed during the hospital encounter of 01/13/17  Blood culture (routine x 2)     Status: None (Preliminary result)   Collection Time: 01/13/17 11:50 AM  Result Value Ref Range Status   Specimen Description BLOOD FA  Final   Special Requests   Final    BOTTLES DRAWN AEROBIC AND ANAEROBIC Blood Culture adequate volume   Culture NO GROWTH 4 DAYS  Final   Report Status PENDING  Incomplete  Blood culture (routine x 2)     Status: None (Preliminary result)   Collection Time: 01/13/17 12:46 PM  Result Value Ref Range Status   Specimen Description BLOOD RAC  Final   Special Requests   Final    BOTTLES DRAWN AEROBIC AND ANAEROBIC Blood Culture adequate volume   Culture NO GROWTH 4 DAYS  Final   Report Status PENDING  Incomplete     Thank you for allowing pharmacy to be a part of this patient's care.  Ramond Dial 01/17/2017 2:07 PM

## 2017-01-17 NOTE — Plan of Care (Signed)
Patient required HFNC to maintain saturation above 90%, patient denies being any shortness of breath.

## 2017-01-17 NOTE — Progress Notes (Signed)
Pt declined CPAP machine tonight, waiting to have someone bring his trilogy

## 2017-01-18 LAB — CULTURE, BLOOD (ROUTINE X 2)
Culture: NO GROWTH
Culture: NO GROWTH
Special Requests: ADEQUATE
Special Requests: ADEQUATE

## 2017-01-18 LAB — GLUCOSE, CAPILLARY
Glucose-Capillary: 370 mg/dL — ABNORMAL HIGH (ref 65–99)
Glucose-Capillary: 304 mg/dL — ABNORMAL HIGH (ref 65–99)
Glucose-Capillary: 413 mg/dL — ABNORMAL HIGH (ref 65–99)
Glucose-Capillary: 364 mg/dL — ABNORMAL HIGH (ref 65–99)
Glucose-Capillary: 362 mg/dL — ABNORMAL HIGH (ref 65–99)
Glucose-Capillary: 309 mg/dL — ABNORMAL HIGH (ref 65–99)

## 2017-01-18 MED ORDER — PREDNISONE 20 MG PO TABS
30.0000 mg | ORAL_TABLET | Freq: Every day | ORAL | Status: AC
  Administered 2017-01-20: 30 mg via ORAL
  Filled 2017-01-18: qty 1

## 2017-01-18 MED ORDER — INSULIN ASPART 100 UNIT/ML ~~LOC~~ SOLN
0.0000 [IU] | SUBCUTANEOUS | Status: DC
  Administered 2017-01-18: 15 [IU] via SUBCUTANEOUS
  Administered 2017-01-19 (×2): 20 [IU] via SUBCUTANEOUS
  Administered 2017-01-19: 11 [IU] via SUBCUTANEOUS
  Administered 2017-01-19 – 2017-01-20 (×3): 20 [IU] via SUBCUTANEOUS
  Administered 2017-01-20: 11 [IU] via SUBCUTANEOUS
  Administered 2017-01-20: 7 [IU] via SUBCUTANEOUS
  Administered 2017-01-20: 15 [IU] via SUBCUTANEOUS
  Filled 2017-01-18 (×10): qty 1

## 2017-01-18 MED ORDER — PREDNISONE 10 MG PO TABS: 10.0000 mg | ORAL_TABLET | Freq: Every day | ORAL | Status: DC

## 2017-01-18 MED ORDER — PREDNISONE 20 MG PO TABS
40.0000 mg | ORAL_TABLET | Freq: Every day | ORAL | Status: AC
  Administered 2017-01-19: 40 mg via ORAL
  Filled 2017-01-18: qty 2

## 2017-01-18 MED ORDER — INSULIN ASPART 100 UNIT/ML ~~LOC~~ SOLN
15.0000 [IU] | Freq: Once | SUBCUTANEOUS | Status: AC
  Administered 2017-01-18: 15 [IU] via SUBCUTANEOUS
  Filled 2017-01-18: qty 1

## 2017-01-18 MED ORDER — HYDROCOD POLST-CPM POLST ER 10-8 MG/5ML PO SUER
5.0000 mL | Freq: Two times a day (BID) | ORAL | Status: DC | PRN
  Administered 2017-01-18 – 2017-01-19 (×2): 5 mL via ORAL
  Filled 2017-01-18 (×2): qty 5

## 2017-01-18 MED ORDER — PREDNISONE 20 MG PO TABS: 20.0000 mg | ORAL_TABLET | Freq: Every day | ORAL | Status: DC

## 2017-01-18 NOTE — Progress Notes (Signed)
[place] [facility] [place] NAME: Raymond [name]    MR#:  [id number]  DATE OF BIRTH:  April 23, 1965  SUBJECTIVE:   His breathing is improved now on 4 L of oxygen continues to diurese well REVIEW OF SYSTEMS:    Review of Systems  Constitutional: Negative for fever, chills weight loss HENT: Negative for ear pain, nosebleeds, congestion, facial swelling, rhinorrhea, neck pain, neck stiffness and ear discharge.   Respiratory: ++ for cough, shortness of breath, NO wheezing  Cardiovascular: Negative for chest pain, palpitations and leg swelling.  Gastrointestinal: Negative for heartburn, abdominal pain, vomiting, diarrhea  Genitourinary: Negative for dysuria, urgency, frequency, hematuria Musculoskeletal: Negative for back pain or joint pain Neurological: Negative for dizziness, seizures, syncope, focal weakness,  numbness and headaches.  Hematological: Does not bruise/bleed easily.  Psychiatric/Behavioral: Negative for hallucinations, confusion, dysphoric mood    Tolerating Diet: yes      DRUG ALLERGIES:  No Known Allergies  VITALS:  Blood pressure (!) 138/42, pulse (!) 54, temperature 97.9 F (36.6 C), temperature source Oral, resp. rate 18, height 6\' 2"  (1.88 m), weight (!) 361 lb 1.6 oz (163.8 kg), SpO2 97 %.  PHYSICAL EXAMINATION:  Constitutional: morbid obesity. No distress. HENT: Normocephalic. Marland Kitchen Oropharynx is clear and moist.  Eyes: Conjunctivae and EOM are normal. PERRLA, no scleral icterus.  Neck: Normal ROM. Neck supple. No JVD. No tracheal deviation. CVS: RRR, S1/S2 +, no murmurs, no gallops, no carotid bruit.  Pulmonary: Effort normal, diminished throughoutno stridor, rhonchi, wheezes, rales.  Abdominal: Soft. BS +,  no distension, tenderness, rebound or guarding.  Musculoskeletal: Normal range of motion. No edema and no tenderness.  Neuro: Alert. CN 2-12 grossly intact. No focal deficits. Skin: Skin is warm and dry. No rash  noted. Psychiatric: Normal mood and affect.      LABORATORY PANEL:   CBC Recent Labs  Lab 01/17/17 0330  WBC 8.1  HGB 11.1*  HCT 33.9*  PLT 356   ------------------------------------------------------------------------------------------------------------------  Chemistries  Recent Labs  Lab 01/17/17 0330  NA 136  K 4.5  CL 102  CO2 27  GLUCOSE 322*  BUN 32*  CREATININE 1.09  CALCIUM 9.1   ------------------------------------------------------------------------------------------------------------------  Cardiac Enzymes Recent Labs  Lab 01/13/17 1524 01/13/17 2111 01/14/17 0356  TROPONINI <0.03 0.03* <0.03   ------------------------------------------------------------------------------------------------------------------  RADIOLOGY:  No results found.   ASSESSMENT AND PLAN:    51 year-old morbidly obese male with history of diastolic heart failure and chronic respiratory failure on 2 L of oxygen at night, OSA on CPAP and diabetes who presents shortness of breath and cough.  1. Acute hypoxic respiratory failure on chronic hypoxic respiratory failure due to community-acquired pneumonia:  CT scan of the chest ordered shows bilateral pulmonary infiltrates Appreciate pulmonary input Continue Zosyn  2. Community-acquired pneumonia: Continue with Zosyn   3.  Acute on chronic diastolic CHF exacerbation patient is responding to IV Lasix  4. Diabetes:  Blood sugars continued to be high will adjust his insulin Should improve with weaning down steroid  5.  Sleep apnea patient to use his machine from home 6. Essential hypertension: Continue enalapril, Coreg  7. Morbid obesity: Patient encouraged to lose weight   8. Constipation: Add stool softeners    Management plans discussed with the patient and he is in agreement.  CODE STATUS: full  TOTAL TIME TAKING CARE OF THIS PATIENT: 24 minutes.     POSSIBLE D/C 1-2 days, DEPENDING ON CLINICAL  CONDITION.   Raymond [name],  [facility] M.D on 01/18/2017 at 4:33 PM  Between 7am to 6pm - Pager - [phone] After 6pm go to [email or web address] - password [email or web address] [facility]  Office  [phone]  CC: Primary care physician; Center, [facility]  Note: This dictation was prepared with Dragon dictation along with smaller phrase technology. Any transcriptional errors that result from this process are unintentional.

## 2017-01-18 NOTE — Progress Notes (Signed)
Inpatient Diabetes Program Recommendations  AACE/ADA: New Consensus Statement on Inpatient Glycemic Control (2015)  Target Ranges:  Prepandial:   less than 140 mg/dL      Peak postprandial:   less than 180 mg/dL (1-2 hours)      Critically ill patients:  140 - 180 mg/dL   Results for AMANTE, FOMBY (MRN [id number]) as of 01/18/2017 13:59  Ref. Range 01/17/2017 08:19 01/17/2017 11:40 01/17/2017 16:25 01/17/2017 20:49 01/17/2017 23:29 01/18/2017 02:56 01/18/2017 08:20 01/18/2017 12:01  Glucose-Capillary Latest Ref Range: 65 - 99 mg/dL 297 (H) 351 (H) 251 (H) 333 (H) 323 (H) 370 (H) 304 (H) 413 (H)   Review of Glycemic Control  Diabetes history: DM 2 Outpatient Diabetes medications: Levemir 89 units bid, Humalog 60 units tid, Metformin 1000 mg bid  Inpatient Diabetes Program Recommendations:    Glucose 300-400 range.  Consider increasing Novolog meal coverage closer to or at home dose 50-60 units tid with meals.  Also consider Novolog Resistant Correction scale 0-20 units at a frequency of Q4hours so patient gets coverage during the night.  Thanks,  Tama Headings RN, MSN, [facility] Inpatient Diabetes Coordinator Team Pager [phone] (8a-5p)

## 2017-01-18 NOTE — Care Management (Signed)
Patient is compliant with Trilogy at home per Jeneen Rinks RT with [facility]. If Tidal Volume needs to be adjusted RT/staff can fax request to Jeneen Rinks with [employer] at [phone] and that adjustment can be made. [facility] has his tidal volume at "630" per Jeneen Rinks. [facility] will need new qualifying sats if patient is requiring 3-4 liters supplemental O2 for home. RNCM can assist during regular business hours- please let me know.  Raymond [name] [phone]

## 2017-01-18 NOTE — Plan of Care (Signed)
  Progressing Health Behavior/Discharge Planning: Ability to manage health-related needs will improve 01/18/2017 1519 - Progressing by Darrelyn Hillock, RN Clinical Measurements: Ability to maintain clinical measurements within normal limits will improve 01/18/2017 1519 - Progressing by Darrelyn Hillock, RN Will remain free from infection 01/18/2017 1519 - Progressing by Darrelyn Hillock, RN Diagnostic test results will improve 01/18/2017 1519 - Progressing by Darrelyn Hillock, RN Respiratory complications will improve 01/18/2017 1519 - Progressing by Darrelyn Hillock, RN Cardiovascular complication will be avoided 01/18/2017 1519 - Progressing by Darrelyn Hillock, RN Coping: Level of anxiety will decrease 01/18/2017 1519 - Progressing by Darrelyn Hillock, RN Safety: Ability to remain free from injury will improve 01/18/2017 1519 - Progressing by Darrelyn Hillock, RN Education: Knowledge of General Education information will improve 01/18/2017 1519 - Progressing by Darrelyn Hillock, RN Activity: Ability to tolerate increased activity will improve 01/18/2017 1519 - Progressing by Darrelyn Hillock, RN Clinical Measurements: Ability to maintain a body temperature in the normal range will improve 01/18/2017 1519 - Progressing by Darrelyn Hillock, RN Respiratory: Ability to maintain adequate ventilation will improve 01/18/2017 1519 - Progressing by Darrelyn Hillock, RN Ability to maintain a clear airway will improve 01/18/2017 1519 - Progressing by Darrelyn Hillock, RN

## 2017-01-18 NOTE — Consult Note (Signed)
[facility] Consultation      Assessment and Plan:  Acute hypoxic respiratory failure with pneumonia, complicated by possible pulmonary edema with volume overload-now doing better. -Continue diuresis. -Continue abx. - MRSA screen negative  Chronic hypoxic respiratory failure with obesity hypoventilation syndrome, severe obstructive sleep apnea and and restrictive lung disease. -Known severe obstructive sleep apnea with an AHI of 106, has been on home trilogy device. He has been intolerant of CPAP while inpatient.  -Family has brought in his home trilogy device, he should use that at night while in the hospital.  Pt appears stable for dc home with abx course, he will follow up with me outpatient. Pulmonary service will sign off for now, please call with any further questions or concerns.    Date: 01/18/2017  MRN# [id number] Raymond [name] Jul 03, 1965   Raymond [name] is a 51 y.o. old male seen in consultation for chief complaint of:    Chief Complaint  Patient presents with  . Shortness of Breath    Subjective:  Pt appears better today, has been weaned down to 3L, he usually uses 2L at home.   Antimicrobials this admission: Levofloxacin 12/14 >> once  Ceftriaxone 12/15 >> Azithromycin 12/15 >>12/18 Zosyn 12/18>>  Microbiology results: BCx: 12/15 >> sent Mrsa screen 12/18 negative.   Social Hx:   Social History   Tobacco Use  . Smoking status: Never Smoker  . Smokeless tobacco: Never Used  Substance Use Topics  . Alcohol use: No    Alcohol/week: 0.0 oz  . Drug use: No   Medication:    Current Facility-Administered Medications:  .  0.9 %  sodium chloride infusion, 250 mL, Intravenous, PRN, Pyreddy, Pavan, MD .  acetaminophen (TYLENOL) tablet 650 mg, 650 mg, Oral, Q6H PRN **OR** acetaminophen (TYLENOL) suppository 650 mg, 650 mg, Rectal, Q6H PRN, Pyreddy, Pavan, MD .  aspirin EC tablet 81 mg, 81 mg, Oral, Daily,  Pyreddy, Pavan, MD, 81 mg at 01/18/17 6387 .  azithromycin (ZITHROMAX) tablet 500 mg, 500 mg, Oral, Daily, Mody, Sital, MD, 500 mg at 01/18/17 0922 .  carvedilol (COREG) tablet 25 mg, 25 mg, Oral, BID, Pyreddy, Pavan, MD, 25 mg at 01/18/17 5643 .  chlorpheniramine-HYDROcodone (TUSSIONEX) 10-8 MG/5ML suspension 5 mL, 5 mL, Oral, Q12H PRN, Harrie Foreman, MD, 5 mL at 01/18/17 0341 .  docusate sodium (COLACE) capsule 100 mg, 100 mg, Oral, BID, Mody, Sital, MD, 100 mg at 01/18/17 0922 .  enalapril (VASOTEC) tablet 40 mg, 40 mg, Oral, Daily, Pyreddy, Pavan, MD, 40 mg at 01/18/17 0923 .  enoxaparin (LOVENOX) injection 40 mg, 40 mg, Subcutaneous, Q12H, Dustin Flock, MD, 40 mg at 01/18/17 3295 .  furosemide (LASIX) injection 40 mg, 40 mg, Intravenous, Q12H, Dustin Flock, MD, 40 mg at 01/18/17 0923 .  gabapentin (NEURONTIN) capsule 300 mg, 300 mg, Oral, TID, Pyreddy, Pavan, MD, 300 mg at 01/18/17 0923 .  insulin aspart (novoLOG) injection 0-20 Units, 0-20 Units, Subcutaneous, TID WC, Bettey Costa, MD, 15 Units at 01/18/17 0921 .  insulin aspart (novoLOG) injection 40 Units, 40 Units, Subcutaneous, TID WC, Lance Coon, MD, 40 Units at 01/18/17 [phone] .  insulin detemir (LEVEMIR) injection 100 Units, 100 Units, Subcutaneous, BID, Dustin Flock, MD, 100 Units at 01/18/17 0930 .  ipratropium-albuterol (DUONEB) 0.5-2.5 (3) MG/3ML nebulizer solution 3 mL, 3 mL, Nebulization, Q6H, Mody, Sital, MD, 3 mL at 01/18/17 0906 .  ipratropium-albuterol (DUONEB) 0.5-2.5 (3) MG/3ML nebulizer solution 3 mL, 3 mL, Nebulization, Q4H PRN, Mody, Sital,  MD .  MEDLINE mouth rinse, 15 mL, Mouth Rinse, BID, Pyreddy, Pavan, MD, 15 mL at 01/18/17 0929 .  ondansetron (ZOFRAN) tablet 4 mg, 4 mg, Oral, Q6H PRN **OR** ondansetron (ZOFRAN) injection 4 mg, 4 mg, Intravenous, Q6H PRN, Pyreddy, Pavan, MD .  oxyCODONE-acetaminophen (PERCOCET/ROXICET) 5-325 MG per tablet 1 tablet, 1 tablet, Oral, Q4H PRN, Pyreddy, Pavan, MD .   pantoprazole (PROTONIX) EC tablet 40 mg, 40 mg, Oral, Daily, Pyreddy, Pavan, MD, 40 mg at 01/18/17 1245 .  piperacillin-tazobactam (ZOSYN) IVPB 3.375 g, 3.375 g, Intravenous, Q8H, Maccia, Melissa D, RPH, Last Rate: 12.5 mL/hr at 01/18/17 0540, 3.375 g at 01/18/17 0540 .  polyethylene glycol (MIRALAX / GLYCOLAX) packet 17 g, 17 g, Oral, Daily, Mody, Sital, MD, 17 g at 01/18/17 0923 .  predniSONE (DELTASONE) tablet 50 mg, 50 mg, Oral, Q breakfast, Lenis Noon, RPH, 50 mg at 01/18/17 8099 .  senna (SENOKOT) tablet 8.6 mg, 1 tablet, Oral, Daily, Mody, Sital, MD, 8.6 mg at 01/18/17 0922 .  sodium chloride flush (NS) 0.9 % injection 3 mL, 3 mL, Intravenous, Q12H, Pyreddy, Pavan, MD, 3 mL at 01/18/17 0924 .  sodium chloride flush (NS) 0.9 % injection 3 mL, 3 mL, Intravenous, PRN, Saundra Shelling, MD   Allergies:  Patient has no known allergies.  Review of Systems: Gen:  Denies  fever, sweats, chills HEENT: Denies blurred vision, double vision. bleeds, sore throat Cvc:  No dizziness, chest pain. Resp:   Denies cough or sputum production,  Gi: Denies swallowing difficulty, stomach pain. Gu:  Denies bladder incontinence, burning urine Ext:   No Joint pain, stiffness. Skin: No skin rash,  hives  Endoc:  No polyuria, polydipsia. Psych: No depression, insomnia. Other:  All other systems were reviewed with the patient and were negative other that what is mentioned in the HPI.   Physical Examination:   VS: BP (!) 138/42 (BP Location: Left Arm)   Pulse (!) 54   Temp 97.9 F (36.6 C) (Oral)   Resp 18   Ht 6\' 2"  (1.88 m)   Wt (!) 361 lb 1.6 oz (163.8 kg)   SpO2 97%   BMI 46.36 kg/m   General Appearance: No distress  Neuro:without focal findings,  speech normal,  HEENT: PERRLA, EOM intact.   Pulmonary: normal breath sounds, No wheezing.  CardiovascularNormal S1,S2.  No m/r/g.   Abdomen: Benign, Soft, non-tender. Renal:  No costovertebral tenderness  GU:  No performed at this time. Endoc:  No evident thyromegaly, no signs of acromegaly. Skin:   warm, no rashes, no ecchymosis  Extremities: normal, no cyanosis, clubbing.  Other findings:    LABORATORY PANEL:   CBC Recent Labs  Lab 01/17/17 0330  WBC 8.1  HGB 11.1*  HCT 33.9*  PLT 356   ------------------------------------------------------------------------------------------------------------------  Chemistries  Recent Labs  Lab 01/17/17 0330  NA 136  K 4.5  CL 102  CO2 27  GLUCOSE 322*  BUN 32*  CREATININE 1.09  CALCIUM 9.1   ------------------------------------------------------------------------------------------------------------------  Cardiac Enzymes Recent Labs  Lab 01/14/17 0356  TROPONINI <0.03   ------------------------------------------------------------  RADIOLOGY:  Ct Chest Wo Contrast  Result Date: 01/16/2017 CLINICAL DATA:  Pneumonia. EXAM: CT CHEST WITHOUT CONTRAST TECHNIQUE: Multidetector CT imaging of the chest was performed following the standard protocol without IV contrast. COMPARISON:  Chest x-ray 01/13/2017 and prior chest CT 03/30/2014 FINDINGS: Cardiovascular: The heart is normal in size and stable. No pericardial effusion. Small amount of pericardial fluid. The aorta is normal in caliber. No  atherosclerotic calcifications. Mediastinum/Nodes: Small scattered mediastinal and hilar lymph nodes but no mass or overt adenopathy. The esophagus is grossly normal. Lungs/Pleura: There are patchy bilateral infiltrates most significant in the right lower lobe. No worrisome pulmonary lesions or associated pleural effusion. The tracheobronchial tree is grossly normal. No endobronchial lesion. Upper Abdomen: No significant upper abdominal findings. Musculoskeletal: No chest wall mass, supraclavicular or axillary adenopathy. The thyroid gland is grossly normal. No significant bony findings. Benign-appearing sclerotic lesion in the T3 vertebral body. IMPRESSION: Bilateral pulmonary infiltrates.  This is most significant in the right lower lobe. No worrisome pulmonary lesions. Electronically Signed   By: Marijo Sanes M.D.   On: 01/16/2017 11:51       Thank  you for the consultation and for allowing [facility] to assist in the care of your patient. Our recommendations are noted above.  Please contact us if we can be of further service.   Marda Stalker, MD.  Board Certified in Internal Medicine, Pulmonary Medicine, [place], and Sleep Medicine.  [facility] Office Number: [phone]  Patricia Pesa, M.D.  Merton Border, M.D  01/18/2017

## 2017-01-18 NOTE — Care Management (Addendum)
RNCM left message for Raymond [name] with [facility] to check status of use of home Trilogy unit. RNCM will follow. Raymond [name] returned my call and will have [facility] RT assess Trilogy use in the home setting.

## 2017-01-18 NOTE — Plan of Care (Signed)
  Progressing Clinical Measurements: Respiratory complications will improve 01/18/2017 0212 - Progressing by [name], RN

## 2017-01-18 NOTE — Progress Notes (Signed)
Notified MD of pt'a cbg of 370. Orders placed. Pt requesting something for a cough. Orders placed. Will continue to monitor and assess.

## 2017-01-19 LAB — GLUCOSE, CAPILLARY
Glucose-Capillary: 370 mg/dL — ABNORMAL HIGH (ref 65–99)
Glucose-Capillary: 298 mg/dL — ABNORMAL HIGH (ref 65–99)
Glucose-Capillary: 392 mg/dL — ABNORMAL HIGH (ref 65–99)
Glucose-Capillary: 360 mg/dL — ABNORMAL HIGH (ref 65–99)
Glucose-Capillary: 357 mg/dL — ABNORMAL HIGH (ref 65–99)

## 2017-01-19 LAB — BASIC METABOLIC PANEL
Anion gap: 10 (ref 5–15)
BUN: 35 mg/dL — ABNORMAL HIGH (ref 6–20)
CO2: 27 mmol/L (ref 22–32)
Calcium: 8.9 mg/dL (ref 8.9–10.3)
Chloride: 98 mmol/L — ABNORMAL LOW (ref 101–111)
Creatinine, Ser: 1.34 mg/dL — ABNORMAL HIGH (ref 0.61–1.24)
GFR calc Af Amer: >60 mL/min (ref >60)
GFR calc non Af Amer: 59 mL/min — ABNORMAL LOW (ref >60)
Glucose, Bld: 395 mg/dL — ABNORMAL HIGH (ref 65–99)
Potassium: 3.9 mmol/L (ref 3.5–5.1)
Sodium: 135 mmol/L (ref 135–145)

## 2017-01-19 MED ORDER — AMOXICILLIN-POT CLAVULANATE 875-125 MG PO TABS
1.0000 | ORAL_TABLET | Freq: Two times a day (BID) | ORAL | Status: DC
  Administered 2017-01-19 – 2017-01-20 (×3): 1 via ORAL
  Filled 2017-01-19 (×3): qty 1

## 2017-01-19 NOTE — Progress Notes (Signed)
Inpatient Diabetes Program Recommendations  AACE/ADA: New Consensus Statement on Inpatient Glycemic Control (2015)  Target Ranges:  Prepandial:   less than 140 mg/dL      Peak postprandial:   less than 180 mg/dL (1-2 hours)      Critically ill patients:  140 - 180 mg/dL   Lab Results  Component Value Date   GLUCAP 392 (H) 01/19/2017   HGBA1C 6.6 (H) 09/30/2014    Review of Glycemic Control  Results for Raymond, [name] (MRN [id number]) as of 01/19/2017 15:00  Ref. Range 01/18/2017 20:33 01/18/2017 22:48 01/19/2017 04:33 01/19/2017 08:27 01/19/2017 12:07  Glucose-Capillary Latest Ref Range: 65 - 99 mg/dL 362 (H) 309 (H) 370 (H) 298 (H) 392 (H)   Diabetes history:DM 2 Outpatient Diabetes medications:Levemir 89 units bid, Humalog 60 units tid, Metformin 1000 mg bid * steroids qam- tapering  Inpatient Diabetes Program Recommendations:  Levemir 100 units bid, Novolog 0-20 units q4h, Novolog 40 units tid  Recommendations: Consider increasing Novolog meal coverage to 55 units tid with meals (hold if the patient eats less than 50%)  Gentry Fitz, RN, BA, [place], CDE Diabetes Coordinator Inpatient Glycemic Control Team [phone] (Team Pager) [phone] ([place]) 01/19/2017 3:04 PM .

## 2017-01-19 NOTE — Progress Notes (Signed)
Raymond [name] at [place] NAME: Raymond [name]    MR#:  [id number]  DATE OF BIRTH:  06-07-1965  SUBJECTIVE:   Patient breathing continues to improve he is currently on 2 L of oxygen  REVIEW OF SYSTEMS:    Review of Systems  Constitutional: Negative for fever, chills weight loss HENT: Negative for ear pain, nosebleeds, congestion, facial swelling, rhinorrhea, neck pain, neck stiffness and ear discharge.   Respiratory: ++ for cough, shortness of breath, NO wheezing  Cardiovascular: Negative for chest pain, palpitations and leg swelling.  Gastrointestinal: Negative for heartburn, abdominal pain, vomiting, diarrhea  Genitourinary: Negative for dysuria, urgency, frequency, hematuria Musculoskeletal: Negative for back pain or joint pain Neurological: Negative for dizziness, seizures, syncope, focal weakness,  numbness and headaches.  Hematological: Does not bruise/bleed easily.  Psychiatric/Behavioral: Negative for hallucinations, confusion, dysphoric mood    Tolerating Diet: yes      DRUG ALLERGIES:  No Known Allergies  VITALS:  Blood pressure 115/61, pulse 60, temperature 97.7 F (36.5 C), temperature source Oral, resp. rate 20, height 6\' 2"  (1.88 m), weight (!) 360 lb 3.2 oz (163.4 kg), SpO2 95 %.  PHYSICAL EXAMINATION:  Constitutional: morbid obesity. No distress. HENT: Normocephalic. Marland Kitchen Oropharynx is clear and moist.  Eyes: Conjunctivae and EOM are normal. PERRLA, no scleral icterus.  Neck: Normal ROM. Neck supple. No JVD. No tracheal deviation. CVS: RRR, S1/S2 +, no murmurs, no gallops, no carotid bruit.  Pulmonary: Effort normal, diminished throughoutno stridor, rhonchi, wheezes, rales.  Abdominal: Soft. BS +,  no distension, tenderness, rebound or guarding.  Musculoskeletal: Normal range of motion. No edema and no tenderness.  Neuro: Alert. CN 2-12 grossly intact. No focal deficits. Skin: Skin is warm and dry. No rash  noted. Psychiatric: Normal mood and affect.      LABORATORY PANEL:   CBC Recent Labs  Lab 01/17/17 0330  WBC 8.1  HGB 11.1*  HCT 33.9*  PLT 356   ------------------------------------------------------------------------------------------------------------------  Chemistries  Recent Labs  Lab 01/19/17 0926  NA 135  K 3.9  CL 98*  CO2 27  GLUCOSE 395*  BUN 35*  CREATININE 1.34*  CALCIUM 8.9   ------------------------------------------------------------------------------------------------------------------  Cardiac Enzymes Recent Labs  Lab 01/13/17 1524 01/13/17 2111 01/14/17 0356  TROPONINI <0.03 0.03* <0.03   ------------------------------------------------------------------------------------------------------------------  RADIOLOGY:  No results found.   ASSESSMENT AND PLAN:    51 year-old morbidly obese male with history of diastolic heart failure and chronic respiratory failure on 2 L of oxygen at night, OSA on CPAP and diabetes who presents shortness of breath and cough.  1. Acute hypoxic respiratory failure on chronic hypoxic respiratory failure due to community-acquired pneumonia and acute on chronic diastolic CHF Change antibiotics to Augmentin  2. Community-acquired pneumonia: Change to Augmentin  3.  Acute on chronic diastolic CHF exacerbation patient is responding to IV Lasix Patient responding to diuresis  4. Diabetes:  Blood sugars continued to be high will adjust his insulin Should improve with weaning down steroid  5.  Sleep apnea patient to use his machine from home  6. Essential hypertension: Continue enalapril, Coreg  7. Morbid obesity: Patient encouraged to lose weight   8. Constipation: Continue current management    Management plans discussed with the patient and he is in agreement.  CODE STATUS: full  TOTAL TIME TAKING CARE OF THIS PATIENT: 24 minutes.     POSSIBLE D/C 1-2 days, DEPENDING ON CLINICAL  CONDITION.   Dustin Flock M.D on 01/19/2017  at 3:22 PM  Between 7am to 6pm - Pager - [phone] After 6pm go to [email or web address] - password [email or web address] [facility]  Office  [phone]  CC: Primary care physician; Center, [facility]  Note: This dictation was prepared with Dragon dictation along with smaller phrase technology. Any transcriptional errors that result from this process are unintentional.

## 2017-01-19 NOTE — Care Management (Signed)
Baseline home O2 delivery 2L however patient is currently requiring more O2. O2 sats needed to determine Liters needed for discharge. RNCM will need to send these along with new Rx to [facility] with [facility] if more that 2L is needed.

## 2017-01-19 NOTE — Plan of Care (Signed)
  Health Behavior/Discharge Planning: Ability to manage health-related needs will improve 01/19/2017 2220 - Progressing by Marylouise Stacks, RN   Clinical Measurements: Ability to maintain clinical measurements within normal limits will improve 01/19/2017 2220 - Progressing by Marylouise Stacks, RN   Clinical Measurements: Diagnostic test results will improve 01/19/2017 2220 - Progressing by Marylouise Stacks, RN   Clinical Measurements: Respiratory complications will improve 01/19/2017 2220 - Progressing by Marylouise Stacks, RN   Activity: Ability to tolerate increased activity will improve 01/19/2017 2220 - Progressing by Marylouise Stacks, RN

## 2017-01-19 NOTE — Plan of Care (Signed)
  Progressing Activity: Ability to tolerate increased activity will improve 01/19/2017 0416 - Progressing by [name], RN Respiratory: Ability to maintain adequate ventilation will improve 01/19/2017 0416 - Progressing by [name], RN Ability to maintain a clear airway will improve 01/19/2017 0416 - Progressing by [name], RN

## 2017-01-20 LAB — BASIC METABOLIC PANEL
Anion gap: 10 (ref 5–15)
BUN: 37 mg/dL — ABNORMAL HIGH (ref 6–20)
CO2: 28 mmol/L (ref 22–32)
Calcium: 9.1 mg/dL (ref 8.9–10.3)
Chloride: 98 mmol/L — ABNORMAL LOW (ref 101–111)
Creatinine, Ser: 1.19 mg/dL (ref 0.61–1.24)
GFR calc Af Amer: >60 mL/min (ref >60)
GFR calc non Af Amer: >60 mL/min (ref >60)
Glucose, Bld: 341 mg/dL — ABNORMAL HIGH (ref 65–99)
Potassium: 4 mmol/L (ref 3.5–5.1)
Sodium: 136 mmol/L (ref 135–145)

## 2017-01-20 LAB — GLUCOSE, CAPILLARY
Glucose-Capillary: 250 mg/dL — ABNORMAL HIGH (ref 65–99)
Glucose-Capillary: 296 mg/dL — ABNORMAL HIGH (ref 65–99)
Glucose-Capillary: 337 mg/dL — ABNORMAL HIGH (ref 65–99)
Glucose-Capillary: 372 mg/dL — ABNORMAL HIGH (ref 65–99)

## 2017-01-20 LAB — CBC
HCT: 35.6 % — ABNORMAL LOW (ref 40.0–52.0)
Hemoglobin: 11.6 g/dL — ABNORMAL LOW (ref 13.0–18.0)
MCH: 26.7 pg (ref 26.0–34.0)
MCHC: 32.5 g/dL (ref 32.0–36.0)
MCV: 82.1 fL (ref 80.0–100.0)
Platelets: 457 10*3/uL — ABNORMAL HIGH (ref 150–440)
RBC: 4.33 MIL/uL — ABNORMAL LOW (ref 4.40–5.90)
RDW: 15.9 % — ABNORMAL HIGH (ref 11.5–14.5)
WBC: 12.2 10*3/uL — ABNORMAL HIGH (ref 3.8–10.6)

## 2017-01-20 MED ORDER — FUROSEMIDE 40 MG PO TABS: 40.0000 mg | ORAL_TABLET | Freq: Two times a day (BID) | ORAL | 11 refills | Status: DC

## 2017-01-20 NOTE — Discharge Summary (Signed)
[facility] - Home at [facility], 51 y.o., DOB 05-13-1965, MRN [id number]. Admission date: 01/13/2017 Discharge Date 01/20/2017 Primary [facility], [place] Admitting Physician Saundra Shelling, MD  Admission Diagnosis  Hypoxia [R09.02] Acute renal failure, unspecified acute renal failure type ([place]) [N17.9] Community acquired pneumonia of right lower lobe of lung ([place]) [J18.1]  Discharge Diagnosis   Active Problems: Acute hypoxic respiratory failure on chronic hypoxic respiratory failure  Pneumonia    Acute on chronic diastolic CHF Diabetes Sleep apnea Essential hypertension Morbid obesity Acute kidney injury Constipation      Hospital Course  Patient is a 51 year old morbidly obese male with history of sleep apnea CHF presented with shortness of breath cough and congestion.  Initially was thought to have pneumonia required high flow oxygen.  Patient continued to not improve therefore CT scan of the chest was done which showed findings consistent with likely CHF.  He was treated with aggressive IV diuresis with significant improvement in his symptoms.  Patient is on 2 L of oxygen which is chronically on.  His breathing is much improved he is stable for discharge.             Consults  cardiology  Significant Tests:  See full reports for all details    Ct Chest Wo Contrast  Result Date: 01/16/2017 CLINICAL DATA:  Pneumonia. EXAM: CT CHEST WITHOUT CONTRAST TECHNIQUE: Multidetector CT imaging of the chest was performed following the standard protocol without IV contrast. COMPARISON:  Chest x-ray 01/13/2017 and prior chest CT 03/30/2014 FINDINGS: Cardiovascular: The heart is normal in size and stable. No pericardial effusion. Small amount of pericardial fluid. The aorta is normal in caliber. No atherosclerotic calcifications. Mediastinum/Nodes: Small scattered mediastinal and hilar lymph nodes but no mass or overt  adenopathy. The esophagus is grossly normal. Lungs/Pleura: There are patchy bilateral infiltrates most significant in the right lower lobe. No worrisome pulmonary lesions or associated pleural effusion. The tracheobronchial tree is grossly normal. No endobronchial lesion. Upper Abdomen: No significant upper abdominal findings. Musculoskeletal: No chest wall mass, supraclavicular or axillary adenopathy. The thyroid gland is grossly normal. No significant bony findings. Benign-appearing sclerotic lesion in the T3 vertebral body. IMPRESSION: Bilateral pulmonary infiltrates. This is most significant in the right lower lobe. No worrisome pulmonary lesions. Electronically Signed   By: Marijo Sanes M.D.   On: 01/16/2017 11:51   Dg Chest Port 1 View  Result Date: 01/13/2017 CLINICAL DATA:  Shortness of breath over the last day. EXAM: PORTABLE CHEST 1 VIEW COMPARISON:  10/03/2014 FINDINGS: There is right lower lobe and possibly right middle lobe pneumonia and collapse. Right upper lobe is clear. Left lung is clear. Heart size is normal. No visible effusion. No bone abnormality. IMPRESSION: Right lower lobe and possibly right middle lobe pneumonia and collapse. Electronically Signed   By: Nelson Chimes M.D.   On: 01/13/2017 12:27       Today   Subjective:   Raymond [name] patient's breathing much improved Objective:   Blood pressure (!) 126/51, pulse (!) 55, temperature 97.7 F (36.5 C), resp. rate 14, height 6\' 2"  (1.88 m), weight (!) 358 lb 11.2 oz (162.7 kg), SpO2 100 %.  .  Intake/Output Summary (Last 24 hours) at 01/20/2017 1754 Last data filed at 01/20/2017 1337 Gross per 24 hour  Intake 720 ml  Output 2000 ml  Net -1280 ml    Exam VITAL SIGNS: Blood pressure (!) 126/51, pulse (!) 55, temperature 97.7 F (36.5  C), resp. rate 14, height 6\' 2"  (1.88 m), weight (!) 358 lb 11.2 oz (162.7 kg), SpO2 100 %.  GENERAL:  51 y.o.-year-old patient lying in the bed with no acute distress.  EYES:  Pupils equal, round, reactive to light and accommodation. No scleral icterus. Extraocular muscles intact.  HEENT: Head atraumatic, normocephalic. Oropharynx and nasopharynx clear.  NECK:  Supple, no jugular venous distention. No thyroid enlargement, no tenderness.  LUNGS: Normal breath sounds bilaterally, no wheezing, rales,rhonchi or crepitation. No use of accessory muscles of respiration.  CARDIOVASCULAR: S1, S2 normal. No murmurs, rubs, or gallops.  ABDOMEN: Soft, nontender, nondistended. Bowel sounds present. No organomegaly or mass.  EXTREMITIES: No pedal edema, cyanosis, or clubbing.  NEUROLOGIC: Cranial nerves II through XII are intact. Muscle strength 5/5 in all extremities. Sensation intact. Gait not checked.  PSYCHIATRIC: The patient is alert and oriented x 3.  SKIN: No obvious rash, lesion, or ulcer.   Data Review     CBC w Diff:  Lab Results  Component Value Date   WBC 12.2 (H) 01/20/2017   HGB 11.6 (L) 01/20/2017   HGB 12.6 (L) 05/02/2011   HCT 35.6 (L) 01/20/2017   HCT 38.0 (L) 05/02/2011   PLT 457 (H) 01/20/2017   PLT 315 05/02/2011   LYMPHOPCT 30 01/13/2017   LYMPHOPCT 22.2 05/02/2011   MONOPCT 14 01/13/2017   MONOPCT 6.9 05/02/2011   EOSPCT 2 01/13/2017   EOSPCT 3.5 05/02/2011   BASOPCT 0 01/13/2017   BASOPCT 0.4 05/02/2011   CMP:  Lab Results  Component Value Date   NA 136 01/20/2017   NA 142 05/02/2011   K 4.0 01/20/2017   K 4.2 05/02/2011   CL 98 (L) 01/20/2017   CL 104 05/02/2011   CO2 28 01/20/2017   CO2 28 05/02/2011   BUN 37 (H) 01/20/2017   BUN 19 (H) 05/02/2011   CREATININE 1.19 01/20/2017   CREATININE 1.14 10/10/2011   PROT 7.9 11/21/2016   PROT 8.3 (H) 03/08/2011   ALBUMIN 3.9 11/21/2016   ALBUMIN 3.9 03/08/2011   BILITOT 0.7 11/21/2016   BILITOT 0.5 03/08/2011   ALKPHOS 81 11/21/2016   ALKPHOS 70 03/08/2011   AST 14 (L) 11/21/2016   AST 19 03/08/2011   ALT 18 11/21/2016   ALT 29 03/08/2011  .  Micro Results Recent Results  (from the past 240 hour(s))  Blood culture (routine x 2)     Status: None   Collection Time: 01/13/17 11:50 AM  Result Value Ref Range Status   Specimen Description BLOOD FA  Final   Special Requests   Final    BOTTLES DRAWN AEROBIC AND ANAEROBIC Blood Culture adequate volume   Culture NO GROWTH 5 DAYS  Final   Report Status 01/18/2017 FINAL  Final  Blood culture (routine x 2)     Status: None   Collection Time: 01/13/17 12:46 PM  Result Value Ref Range Status   Specimen Description BLOOD RAC  Final   Special Requests   Final    BOTTLES DRAWN AEROBIC AND ANAEROBIC Blood Culture adequate volume   Culture NO GROWTH 5 DAYS  Final   Report Status 01/18/2017 FINAL  Final  Respiratory Panel by PCR     Status: Abnormal   Collection Time: 01/17/17 12:39 PM  Result Value Ref Range Status   Adenovirus NOT DETECTED NOT DETECTED Final   Coronavirus 229E NOT DETECTED NOT DETECTED Final   Coronavirus HKU1 NOT DETECTED NOT DETECTED Final   Coronavirus NL63 NOT DETECTED  NOT DETECTED Final   Coronavirus OC43 NOT DETECTED NOT DETECTED Final   Metapneumovirus DETECTED (A) NOT DETECTED Final   Rhinovirus / Enterovirus NOT DETECTED NOT DETECTED Final   Influenza A NOT DETECTED NOT DETECTED Final   Influenza B NOT DETECTED NOT DETECTED Final   Parainfluenza Virus 1 NOT DETECTED NOT DETECTED Final   Parainfluenza Virus 2 NOT DETECTED NOT DETECTED Final   Parainfluenza Virus 3 NOT DETECTED NOT DETECTED Final   Parainfluenza Virus 4 NOT DETECTED NOT DETECTED Final   Respiratory Syncytial Virus NOT DETECTED NOT DETECTED Final   Bordetella pertussis NOT DETECTED NOT DETECTED Final   Chlamydophila pneumoniae NOT DETECTED NOT DETECTED Final   Mycoplasma pneumoniae NOT DETECTED NOT DETECTED Final    Comment: Performed at [facility], [place] [address], [place], [place] [address]  MRSA PCR Screening     Status: None   Collection Time: 01/17/17  3:24 PM  Result Value Ref Range Status   MRSA by PCR  NEGATIVE NEGATIVE Final    Comment:        The GeneXpert MRSA Assay (FDA approved for NASAL specimens only), is one component of a comprehensive MRSA colonization surveillance program. It is not intended to diagnose MRSA infection nor to guide or monitor treatment for MRSA infections.      Code Status History    Date Active Date Inactive Code Status Order ID Comments User Context   01/13/2017 15:03 01/20/2017 17:08 Full Code [id number]  Saundra Shelling, MD Inpatient   09/30/2014 17:22 10/04/2014 17:07 Full Code [id number]  Henreitta Leber, [place], [facility]. Go on 01/26/2017.   Specialty:  General Practice Why:  at 1030am Contact information: [name] [address] [phone]        [place]. Schedule an appointment as soon as possible for a visit.   Specialty:  Nutrition & Diabetics Contact information: [place] [id number] [place]       Corey Skains, MD Follow up in 1 week(s).   Specialty:  Cardiology Contact information: [address] [place] [place] [address] [phone]           Discharge Medications   Allergies as of 01/20/2017   No Known Allergies     Medication List    STOP taking these medications   sulfamethoxazole-trimethoprim 800-160 MG tablet Commonly known as:  BACTRIM DS,SEPTRA DS     TAKE these medications   aspirin EC 81 MG tablet Take 81 mg by mouth daily.   carvedilol 25 MG tablet Commonly known as:  COREG Take 25 mg by mouth 2 (two) times daily.   enalapril 20 MG tablet Commonly known as:  VASOTEC Take 40 mg by mouth daily.   furosemide 40 MG tablet Commonly known as:  LASIX Take 1 tablet (40 mg total) by mouth 2 (two) times daily. What changed:    when to take this  reasons to take this   gabapentin 300 MG  capsule Commonly known as:  NEURONTIN Take 300 mg by mouth 3 (three) times daily.   insulin detemir 100 UNIT/ML injection Commonly known as:  LEVEMIR Inject 89 Units into the skin 2 (two) times daily.   insulin lispro 100 UNIT/ML cartridge Commonly known as:  HUMALOG Inject 60 Units into the skin 3 (three) times daily with meals.  metFORMIN 1000 MG tablet Commonly known as:  GLUCOPHAGE Take 1,000 mg by mouth 2 (two) times daily with a meal.   oxyCODONE-acetaminophen 5-325 MG tablet Commonly known as:  ROXICET Take 1 tablet by mouth every 4 (four) hours as needed for severe pain.   pantoprazole 40 MG tablet Commonly known as:  PROTONIX Take 40 mg by mouth daily.          Total Time in preparing paper work, data evaluation and todays exam - 35 minutes  Dustin Flock M.D on 01/20/2017 at 5:54 PM  [facility]   Office  [phone]

## 2017-01-20 NOTE — Progress Notes (Signed)
Inpatient Diabetes Program Recommendations  AACE/ADA: New Consensus Statement on Inpatient Glycemic Control (2015)  Target Ranges:  Prepandial:   less than 140 mg/dL      Peak postprandial:   less than 180 mg/dL (1-2 hours)      Critically ill patients:  140 - 180 mg/dL   Lab Results  Component Value Date   GLUCAP 250 (H) 01/20/2017   HGBA1C 6.6 (H) 09/30/2014    Review of Glycemic Control  Results for [name], [name] (MRN [id number]) as of 01/20/2017 10:22  Ref. Range 01/19/2017 16:19 01/19/2017 20:58 01/20/2017 00:12 01/20/2017 04:06 01/20/2017 07:58  Glucose-Capillary Latest Ref Range: 65 - 99 mg/dL 360 (H) 357 (H) 372 (H) 337 (H) 250 (H)   Diabetes history:DM 2 Outpatient Diabetes medications:Levemir 89 units bid, Humalog 60 units tid, Metformin 1000 mg bid * steroids qam- tapering  Inpatient Diabetes Program Recommendations:Levemir 100 units bid, Novolog 0-20 units q4h, Novolog 40 units tid  Recommendations: Consider increasing Novolog meal coverage to 55 units tid with meals (hold if the patient eats less than 50%) -* takes 60 units tid at home  Gentry Fitz, RN, [place], [place], CDE Diabetes Coordinator Inpatient Diabetes Program  [phone] (Team Pager) [phone] ([place]) 01/20/2017 10:23 AM

## 2017-01-20 NOTE — Care Management Important Message (Signed)
Important Message  Patient Details  Name: Raymond [name] MRN: [id number] Date of Birth: [birth year]   Medicare Important Message Given:  Yes Signed IM notice given    Katrina Stack, RN 01/20/2017, 10:57 AM

## 2017-01-20 NOTE — Discharge Instructions (Addendum)
[place] at [place]:  Cardiac diet  DISCHARGE CONDITION:  Stable  ACTIVITY:  Activity as tolerated  OXYGEN:  Home Oxygen: Yes.     Oxygen Delivery: 2 liters/min via Patient connected to nasal cannula oxygen  DISCHARGE LOCATION:  home    ADDITIONAL DISCHARGE INSTRUCTION: Watch salt intake and notify md if greater than 2lbs weight gain in day  If you experience worsening of your admission symptoms, develop shortness of breath, life threatening emergency, suicidal or homicidal thoughts you must seek medical attention immediately by calling 911 or calling your MD immediately  if symptoms less severe.  You Must read complete instructions/literature along with all the possible adverse reactions/side effects for all the Medicines you take and that have been prescribed to you. Take any new Medicines after you have completely understood and accpet all the possible adverse reactions/side effects.   Please note  You were cared for by a hospitalist during your hospital stay. If you have any questions about your discharge medications or the care you received while you were in the hospital after you are discharged, you can call the unit and asked to speak with the hospitalist on call if the hospitalist that took care of you is not available. Once you are discharged, your primary care physician will handle any further medical issues. Please note that NO REFILLS for any discharge medications will be authorized once you are discharged, as it is imperative that you return to your primary care physician (or establish a relationship with a primary care physician if you do not have one) for your aftercare needs so that they can reassess your need for medications and monitor your lab values.

## 2017-01-20 NOTE — Discharge Planning (Signed)
Discharge instructions reviewed with pt. Pt verbalizes understanding. Pt ready for discharge home. 

## 2017-02-01 ENCOUNTER — Encounter: Payer: Self-pay | Admitting: Family

## 2017-02-01 ENCOUNTER — Ambulatory Visit: Payer: Medicare PPO | Attending: Family | Admitting: Family

## 2017-02-01 ENCOUNTER — Other Ambulatory Visit: Payer: Self-pay

## 2017-02-01 VITALS — BP 138/61 | HR 69 | Resp 18 | Ht 74.0 in | Wt 375.1 lb

## 2017-02-01 DIAGNOSIS — E1140 Type 2 diabetes mellitus with diabetic neuropathy, unspecified: Secondary | ICD-10-CM | POA: Diagnosis not present

## 2017-02-01 DIAGNOSIS — E119 Type 2 diabetes mellitus without complications: Secondary | ICD-10-CM | POA: Insufficient documentation

## 2017-02-01 DIAGNOSIS — I110 Hypertensive heart disease with heart failure: Secondary | ICD-10-CM | POA: Insufficient documentation

## 2017-02-01 DIAGNOSIS — M7989 Other specified soft tissue disorders: Secondary | ICD-10-CM | POA: Insufficient documentation

## 2017-02-01 DIAGNOSIS — Z7982 Long term (current) use of aspirin: Secondary | ICD-10-CM | POA: Diagnosis not present

## 2017-02-01 DIAGNOSIS — Z0189 Encounter for other specified special examinations: Secondary | ICD-10-CM | POA: Diagnosis present

## 2017-02-01 DIAGNOSIS — J9611 Chronic respiratory failure with hypoxia: Secondary | ICD-10-CM | POA: Insufficient documentation

## 2017-02-01 DIAGNOSIS — E1151 Type 2 diabetes mellitus with diabetic peripheral angiopathy without gangrene: Secondary | ICD-10-CM | POA: Insufficient documentation

## 2017-02-01 DIAGNOSIS — G4733 Obstructive sleep apnea (adult) (pediatric): Secondary | ICD-10-CM | POA: Diagnosis not present

## 2017-02-01 DIAGNOSIS — Z79899 Other long term (current) drug therapy: Secondary | ICD-10-CM | POA: Insufficient documentation

## 2017-02-01 DIAGNOSIS — Z23 Encounter for immunization: Secondary | ICD-10-CM | POA: Insufficient documentation

## 2017-02-01 DIAGNOSIS — I10 Essential (primary) hypertension: Secondary | ICD-10-CM | POA: Insufficient documentation

## 2017-02-01 DIAGNOSIS — I5032 Chronic diastolic (congestive) heart failure: Secondary | ICD-10-CM | POA: Diagnosis present

## 2017-02-01 DIAGNOSIS — K219 Gastro-esophageal reflux disease without esophagitis: Secondary | ICD-10-CM | POA: Insufficient documentation

## 2017-02-01 DIAGNOSIS — Z794 Long term (current) use of insulin: Secondary | ICD-10-CM | POA: Diagnosis not present

## 2017-02-01 LAB — GLUCOSE, CAPILLARY: Glucose-Capillary: 205 mg/dL — ABNORMAL HIGH (ref 65–99)

## 2017-02-01 MED ORDER — TORSEMIDE 20 MG PO TABS: 40.0000 mg | ORAL_TABLET | Freq: Two times a day (BID) | ORAL | 3 refills | Status: DC

## 2017-02-01 NOTE — Progress Notes (Signed)
[name], male    DOB: [birth year], 52 y.o.   MRN: [id number]  HPI  Raymond [name] is a 52 y/o male with a history of DM, HTN, PVD, GERD, obstructive sleep apnea and chronic heart failure.   Has not had an echocardiogram done.  Admitted 01/13/17 due to HF. Chest CT done which showed HF and he was treated with IV diuretics and then transitioned to oral diuretics. Pulmonology consult was obtained. Discharged after 7 days. Was in the ED 11/21/16 due to abscess where he was treated and released.  He presents today for a follow-up visit although hasn't been seen for many years here. He presents with a chief complaint of minimal fatigue upon moderate exertion. He describes this as chronic in nature having been present for several years with varying levels of severity. He has associated worsening pedal edema and difficulty sleeping along with this.   Past Medical History:  Diagnosis Date  . Arthritis    hands  . Cellulitis   . CHF (congestive heart failure) ([place])   . Chronic respiratory failure with hypoxia (HCC)    2 L [place] continuously  . Diabetes ([place])   . Edema    FEET/LEGS  . GERD (gastroesophageal reflux disease)   . Hepatic steatosis   . HOH (hard of hearing)   . Hypertension   . Morbid obesity ([place])   . Neuropathy   . Orthopnea   . OSA on CPAP    TRILOGY VENTILATOR  . Oxygen deficiency    2L  HS  . PVD (peripheral vascular disease) ([place])   . Shortness of breath dyspnea   . Systolic heart failure (HCC)    Preserved EF 50-55%   Past Surgical History:  Procedure Laterality Date  . AMPUTATION Right 10/02/2014   Procedure: AMPUTATION RAY;  Surgeon: Albertine Patricia, DPM;  Location: [facility] ORS;  Service: Podiatry;  Laterality: Right;  . CATARACT EXTRACTION W/PHACO Right 09/10/2015   Procedure: CATARACT EXTRACTION PHACO AND INTRAOCULAR LENS PLACEMENT (IOC);  Surgeon: Birder Robson, MD;  Location: [facility] ORS;  Service: Ophthalmology;  Laterality: Right;   [place] 01:34 AP% 19.3 CDE 18.36 Fluid pack lot # [id number]  . CATARACT EXTRACTION W/PHACO Left 10/06/2015   Procedure: CATARACT EXTRACTION PHACO AND INTRAOCULAR LENS PLACEMENT (IOC);  Surgeon: Birder Robson, MD;  Location: [facility] ORS;  Service: Ophthalmology;  Laterality: Left;  [place] 00:56 AP% 19.5 CDE 11.02 Fluid Pack # [id number]  . CHOLECYSTECTOMY    . COLONOSCOPY WITH PROPOFOL N/A 05/17/2016   Procedure: COLONOSCOPY WITH PROPOFOL;  Surgeon: Lollie Sails, MD;  Location: [facility] ENDOSCOPY;  Service: Endoscopy;  Laterality: N/A;  . EYE SURGERY Left    bilat laser  . HIP SURGERY Right    8 th grade pins  . NOSE SURGERY    . PERIPHERAL VASCULAR CATHETERIZATION Right 10/02/2014   Procedure: Lower Extremity Angiography;  Surgeon: Algernon Huxley, MD;  Location: [facility];  Service: Cardiovascular;  Laterality: Right;  . TOE AMPUTATION Left 2010   2cd  . TOE AMPUTATION Right 2009   Family History  Problem Relation Age of Onset  . Hypertension Mother   . Diabetes Mother   . Stroke Father   . Hypertension Father   . Heart attack Father    Social History   Tobacco Use  . Smoking status: Never Smoker  . Smokeless tobacco: Never Used  Substance Use Topics  . Alcohol use: No    Alcohol/week: 0.0 oz   No  Known Allergies Prior to Admission medications   Medication Sig Start Date End Date Taking? Authorizing Provider  aspirin EC 81 MG tablet Take 81 mg by mouth daily.   Yes [provider]  carvedilol (COREG) 25 MG tablet Take 25 mg by mouth 2 (two) times daily.    Yes [provider]  enalapril (VASOTEC) 20 MG tablet Take 40 mg by mouth daily.    Yes [provider]  furosemide (LASIX) 40 MG tablet Take 1 tablet (40 mg total) by mouth 2 (two) times daily. 01/20/17  Yes Dustin Flock, MD  gabapentin (NEURONTIN) 300 MG capsule Take 300 mg by mouth 3 (three) times daily.   Yes [provider]  insulin detemir (LEVEMIR) 100 UNIT/ML injection Inject 89  Units into the skin 2 (two) times daily.    Yes [provider]  insulin lispro (HUMALOG) 100 UNIT/ML cartridge Inject 60 Units into the skin 3 (three) times daily with meals.   Yes [provider]  metFORMIN (GLUCOPHAGE) 1000 MG tablet Take 1,000 mg by mouth 2 (two) times daily with a meal.   Yes [provider]  pantoprazole (PROTONIX) 40 MG tablet Take 40 mg by mouth daily.   Yes [provider]    Review of Systems  Constitutional: Positive for fatigue. Negative for appetite change.  HENT: Negative for congestion, postnasal drip and sore throat.   Eyes: Negative.   Respiratory: Negative for chest tightness and shortness of breath.   Cardiovascular: Positive for leg swelling. Negative for chest pain and palpitations.  Gastrointestinal: Negative for abdominal distention and abdominal pain.  Endocrine: Negative.   Genitourinary: Negative.   Musculoskeletal: Negative for back pain and neck pain.  Skin: Negative.   Allergic/Immunologic: Negative.   Neurological: Negative for dizziness and light-headedness.  Hematological: Negative for adenopathy. Does not bruise/bleed easily.  Psychiatric/Behavioral: Positive for sleep disturbance (wearing triology at bedtime). Negative for dysphoric mood. The patient is not nervous/anxious.    Vitals:   02/01/17 0919  BP: 138/61  Pulse: 69  Resp: 18  SpO2: 96%  Weight: (!) 375 lb 2 oz (170.2 kg)  Height: 6\' 2"  (1.88 m)   Wt Readings from Last 3 Encounters:  02/01/17 (!) 375 lb 2 oz (170.2 kg)  01/20/17 (!) 358 lb 11.2 oz (162.7 kg)  12/08/16 (!) 391 lb (177.4 kg)   Lab Results  Component Value Date   CREATININE 1.19 01/20/2017   CREATININE 1.34 (H) 01/19/2017   CREATININE 1.09 01/17/2017   Physical Exam  Constitutional: He is oriented to person, place, and time. He appears well-developed and well-nourished.  HENT:  Head: Normocephalic and atraumatic.  Neck: Normal range of motion. Neck supple. No JVD  present.  Cardiovascular: Normal rate and regular rhythm.  Pulmonary/Chest: Effort normal. He has no wheezes. He has no rales.  Abdominal: Soft. He exhibits no distension. There is no tenderness.  Musculoskeletal: He exhibits edema (2-3+ pitting edema in bilateral lower legs). He exhibits no tenderness.  Neurological: He is alert and oriented to person, place, and time.  Skin: Skin is warm and dry.  Psychiatric: He has a normal mood and affect. His behavior is normal. Thought content normal.  Nursing note and vitals reviewed.   Assessment & Plan:  1: Chronic heart failure with preserved ejection fraction- - NYHA class II - moderately fluid overloaded today - hasn't been weighing daily and he was instructed to start weighing every morning and to call for an overnight weight gain of >2 pounds  or a weekly weight gain of >5 pounds - not adding salt and reminded to closely following a 2000mg  sodium diet - admits to drinking ~ 96 ounces of fluid daily. Discussed decreasing that amount to closer to 60 ounces daily - will change his diuretic to torsemide 40mg  BID; discussed hopefully decreasing that dosage once his fluid status has improved; last potassium level was 4.0 on 01/20/17 - will get echocardiogram ordered  - will also get patient an appointment with cardiologist Nehemiah Massed) - patient reports receiving his flu vaccine for this season - Pharm D went in and reviewed medications with the patient  2: HTN- - BP looks good today - BMP from 01/20/17 reviewed and showed sodium 136, potassium 4.0 and GFR >60  3: Obstructive sleep apnea-  - wears trilogy at bedtime  4: Diabetes, type 2-  - fasting glucose in clinic today was 205 - A1c on 09/30/14 was 6.6% - follows with PCP [facility]) regarding this  Medication list was reviewed.  Return in 1 week or sooner for any questions/problems before then. Will check a BMP at that time since diuretic being changed.

## 2017-02-01 NOTE — Patient Instructions (Addendum)
Begin weighing daily and call for an overnight weight gain of > 2 pounds or a weekly weight gain of >5 pounds.  Stop furosemide and begin torsemide 40mg  twice daily. (two of the 20mg  tablets)  Decrease fluid intake to closer to 60 ounces daily.

## 2017-02-03 ENCOUNTER — Ambulatory Visit
Admission: RE | Admit: 2017-02-03 | Discharge: 2017-02-03 | Disposition: A | Discharge status: Home / Self Care | Payer: Medicare PPO | Source: Ambulatory Visit | Attending: Family | Admitting: Family

## 2017-02-03 DIAGNOSIS — R0602 Shortness of breath: Secondary | ICD-10-CM | POA: Insufficient documentation

## 2017-02-03 DIAGNOSIS — I5032 Chronic diastolic (congestive) heart failure: Secondary | ICD-10-CM | POA: Diagnosis present

## 2017-02-03 DIAGNOSIS — G4733 Obstructive sleep apnea (adult) (pediatric): Secondary | ICD-10-CM | POA: Insufficient documentation

## 2017-02-03 DIAGNOSIS — I110 Hypertensive heart disease with heart failure: Secondary | ICD-10-CM | POA: Insufficient documentation

## 2017-02-03 DIAGNOSIS — E119 Type 2 diabetes mellitus without complications: Secondary | ICD-10-CM | POA: Insufficient documentation

## 2017-02-03 DIAGNOSIS — R609 Edema, unspecified: Secondary | ICD-10-CM | POA: Diagnosis not present

## 2017-02-03 DIAGNOSIS — K219 Gastro-esophageal reflux disease without esophagitis: Secondary | ICD-10-CM | POA: Diagnosis not present

## 2017-02-03 NOTE — Progress Notes (Signed)
*  PRELIMINARY RESULTS* Echocardiogram 2D Echocardiogram has been performed.  Raymond [name] 02/03/2017, 11:09 AM

## 2017-02-09 NOTE — Progress Notes (Signed)
[name], male    DOB: 08/23/1965, 52 y.o.   MRN: [id number]  HPI  Raymond [name] is a 52 y/o male with a history of DM, HTN, PVD, GERD, obstructive sleep apnea and chronic heart failure.   Echo report from 02/03/17 reviewed and showed an EF of 55-65% along with trivial Raymond/TR.  Admitted 01/13/17 due to HF. Chest CT done which showed HF and he was treated with IV diuretics and then transitioned to oral diuretics. Pulmonology consult was obtained. Discharged after 7 days. Was in the ED 11/21/16 due to abscess where he was treated and released.  He presents today for a follow-up visit with a chief complaint of edema in his lower legs. He describes this as chronic in nature having been present for several years. He denies any fatigue, chest pain, shortness of breath, palpitations, abdominal distention, dizziness, weight gain or difficulty sleeping. Has had echo done since he was last here.   Past Medical History:  Diagnosis Date  . Arthritis    hands  . Cellulitis   . CHF (congestive heart failure) ([place])   . Chronic respiratory failure with hypoxia (HCC)    2 L [place] continuously  . Diabetes ([place])   . Edema    FEET/LEGS  . GERD (gastroesophageal reflux disease)   . Hepatic steatosis   . HOH (hard of hearing)   . Hypertension   . Morbid obesity ([place])   . Neuropathy   . Orthopnea   . OSA on CPAP    TRILOGY VENTILATOR  . Oxygen deficiency    2L  HS  . PVD (peripheral vascular disease) ([place])   . Shortness of breath dyspnea   . Systolic heart failure (HCC)    Preserved EF 50-55%   Past Surgical History:  Procedure Laterality Date  . AMPUTATION Right 10/02/2014   Procedure: AMPUTATION RAY;  Surgeon: Albertine Patricia, DPM;  Location: [facility] ORS;  Service: Podiatry;  Laterality: Right;  . CATARACT EXTRACTION W/PHACO Right 09/10/2015   Procedure: CATARACT EXTRACTION PHACO AND INTRAOCULAR LENS PLACEMENT (IOC);  Surgeon: Birder Robson, MD;  Location: [facility] ORS;   Service: Ophthalmology;  Laterality: Right;  [place] 01:34 AP% 19.3 CDE 18.36 Fluid pack lot # [id number]  . CATARACT EXTRACTION W/PHACO Left 10/06/2015   Procedure: CATARACT EXTRACTION PHACO AND INTRAOCULAR LENS PLACEMENT (IOC);  Surgeon: Birder Robson, MD;  Location: [facility] ORS;  Service: Ophthalmology;  Laterality: Left;  [place] 00:56 AP% 19.5 CDE 11.02 Fluid Pack # [id number]  . CHOLECYSTECTOMY    . COLONOSCOPY WITH PROPOFOL N/A 05/17/2016   Procedure: COLONOSCOPY WITH PROPOFOL;  Surgeon: Lollie Sails, MD;  Location: [facility];  Service: Endoscopy;  Laterality: N/A;  . EYE SURGERY Left    bilat laser  . HIP SURGERY Right    8 th grade pins  . NOSE SURGERY    . PERIPHERAL VASCULAR CATHETERIZATION Right 10/02/2014   Procedure: Lower Extremity Angiography;  Surgeon: Algernon Huxley, MD;  Location: [facility];  Service: Cardiovascular;  Laterality: Right;  . TOE AMPUTATION Left 2010   2cd  . TOE AMPUTATION Right 2009   Family History  Problem Relation Age of Onset  . Hypertension Mother   . Diabetes Mother   . Stroke Father   . Hypertension Father   . Heart attack Father    Social History   Tobacco Use  . Smoking status: Never Smoker  . Smokeless tobacco: Never Used  Substance Use Topics  . Alcohol use: No  Alcohol/week: 0.0 oz   No Known Allergies  Prior to Admission medications   Medication Sig Start Date End Date Taking? Authorizing Provider  aspirin EC 81 MG tablet Take 81 mg by mouth daily.   Yes [provider]  carvedilol (COREG) 12.5 MG tablet Take 1 tablet by mouth 2 (two) times daily. 02/09/17 02/09/18 Yes [provider]  enalapril (VASOTEC) 20 MG tablet Take 40 mg by mouth daily.    Yes [provider]  gabapentin (NEURONTIN) 300 MG capsule Take 300 mg by mouth 3 (three) times daily.   Yes [provider]  insulin detemir (LEVEMIR) 100 UNIT/ML injection Inject 89 Units into the skin 2 (two) times daily.    Yes [provider]  insulin lispro (HUMALOG) 100 UNIT/ML cartridge Inject 60 Units into the skin 3 (three) times daily with meals.   Yes [provider]  metFORMIN (GLUCOPHAGE) 1000 MG tablet Take 1,000 mg by mouth 2 (two) times daily with a meal.   Yes [provider]  pantoprazole (PROTONIX) 40 MG tablet Take 40 mg by mouth daily.   Yes [provider]  torsemide (DEMADEX) 20 MG tablet Take 2 tablets (40 mg total) by mouth 2 (two) times daily. 02/01/17 03/03/17 Yes Alisa Graff, FNP    Review of Systems  Constitutional: Negative for appetite change and fatigue.  HENT: Negative for congestion, postnasal drip and sore throat.   Eyes: Negative.   Respiratory: Negative for chest tightness and shortness of breath.   Cardiovascular: Positive for leg swelling. Negative for chest pain and palpitations.  Gastrointestinal: Negative for abdominal distention and abdominal pain.  Endocrine: Negative.   Genitourinary: Negative.   Musculoskeletal: Negative for back pain and neck pain.  Skin: Negative.   Allergic/Immunologic: Negative.   Neurological: Negative for dizziness and light-headedness.  Hematological: Negative for adenopathy. Does not bruise/bleed easily.  Psychiatric/Behavioral: Negative for dysphoric mood and sleep disturbance (wearing triology at bedtime). The patient is not nervous/anxious.    Vitals:   02/10/17 0936  BP: (!) 129/59  Pulse: 73  Resp: 18  SpO2: 93%  Weight: (!) 376 lb 4 oz (170.7 kg)  Height: 6\' 2"  (1.88 m)   Wt Readings from Last 3 Encounters:  02/10/17 (!) 376 lb 4 oz (170.7 kg)  02/01/17 (!) 375 lb 2 oz (170.2 kg)  01/20/17 (!) 358 lb 11.2 oz (162.7 kg)   Lab Results  Component Value Date   CREATININE 1.19 01/20/2017   CREATININE 1.34 (H) 01/19/2017   CREATININE 1.09 01/17/2017   Physical Exam  Constitutional: He is oriented to person, place, and time. He appears well-developed and well-nourished.  HENT:  Head: Normocephalic and  atraumatic.  Neck: Normal range of motion. Neck supple. No JVD present.  Cardiovascular: Normal rate and regular rhythm.  Pulmonary/Chest: Effort normal. He has no wheezes. He has no rales.  Abdominal: Soft. He exhibits no distension. There is no tenderness.  Musculoskeletal: He exhibits edema (2+ pitting edema in bilateral lower legs). He exhibits no tenderness.  Neurological: He is alert and oriented to person, place, and time.  Skin: Skin is warm and dry.  Psychiatric: He has a normal mood and affect. His behavior is normal. Thought content normal.  Nursing note and vitals reviewed.   Assessment & Plan:  1: Chronic heart failure with preserved ejection fraction- - NYHA class I - mildly fluid overloaded today - weighing daily and he was reminded to call for an overnight weight gain of >2 pounds  or a weekly weight gain of >5 pounds - not adding salt and reminded to closely following a 2000mg  sodium diet - admits to drinking ~ 76 ounces of fluid daily. Has decreased consumption from ~ 96 ounces daily - BMP just drawn so will not repeat today - saw cardiologist Melvern Sample) 02/09/17 - patient reports receiving his flu vaccine for this season - reviewed echo results with the patient  2: HTN- - BP looks good today - BMP from 02/09/17 reviewed and showed sodium 143, potassium 5.3 and GFR 43  3: Obstructive sleep apnea-  - wears trilogy at bedtime - sees pulmonologist Ashby Dawes) 03/01/17  4: Diabetes, type 2-  - fasting glucose at home yesterday was 202  - A1c on 09/30/14 was 6.6% - follows with PCP [facility]) regarding this  5: Lymphedema- - admits to not elevating his legs much and he was encouraged to elevate them when he's sitting for long periods of time - also instructed to get diabetic compression socks and begin putting them on every morning with removal at bedtime - also encouraged to start walking more - should edema persist after above therapies, could consider  lymphapress compression boots  Medication list was reviewed.  Return in 3 months or sooner for any questions/problems before then.

## 2017-02-10 ENCOUNTER — Encounter: Payer: Self-pay | Admitting: Family

## 2017-02-10 ENCOUNTER — Ambulatory Visit: Payer: Medicare PPO | Attending: Family | Admitting: Family

## 2017-02-10 VITALS — BP 129/59 | HR 73 | Resp 18 | Ht 74.0 in | Wt 376.2 lb

## 2017-02-10 DIAGNOSIS — M19041 Primary osteoarthritis, right hand: Secondary | ICD-10-CM | POA: Diagnosis not present

## 2017-02-10 DIAGNOSIS — I509 Heart failure, unspecified: Secondary | ICD-10-CM | POA: Diagnosis present

## 2017-02-10 DIAGNOSIS — M19042 Primary osteoarthritis, left hand: Secondary | ICD-10-CM | POA: Diagnosis not present

## 2017-02-10 DIAGNOSIS — I890 Lymphedema, not elsewhere classified: Secondary | ICD-10-CM | POA: Diagnosis not present

## 2017-02-10 DIAGNOSIS — G4733 Obstructive sleep apnea (adult) (pediatric): Secondary | ICD-10-CM | POA: Diagnosis not present

## 2017-02-10 DIAGNOSIS — Z794 Long term (current) use of insulin: Secondary | ICD-10-CM | POA: Diagnosis not present

## 2017-02-10 DIAGNOSIS — I5032 Chronic diastolic (congestive) heart failure: Secondary | ICD-10-CM

## 2017-02-10 DIAGNOSIS — I739 Peripheral vascular disease, unspecified: Secondary | ICD-10-CM | POA: Diagnosis not present

## 2017-02-10 DIAGNOSIS — Z79899 Other long term (current) drug therapy: Secondary | ICD-10-CM | POA: Diagnosis not present

## 2017-02-10 DIAGNOSIS — E119 Type 2 diabetes mellitus without complications: Secondary | ICD-10-CM | POA: Diagnosis not present

## 2017-02-10 DIAGNOSIS — I110 Hypertensive heart disease with heart failure: Secondary | ICD-10-CM | POA: Diagnosis not present

## 2017-02-10 DIAGNOSIS — K219 Gastro-esophageal reflux disease without esophagitis: Secondary | ICD-10-CM | POA: Diagnosis not present

## 2017-02-10 DIAGNOSIS — Z7982 Long term (current) use of aspirin: Secondary | ICD-10-CM | POA: Insufficient documentation

## 2017-02-10 DIAGNOSIS — I10 Essential (primary) hypertension: Secondary | ICD-10-CM

## 2017-02-10 NOTE — Patient Instructions (Signed)
Continue weighing daily and call for an overnight weight gain of > 2 pounds or a weekly weight gain of >5 pounds. 

## 2017-02-11 DIAGNOSIS — I890 Lymphedema, not elsewhere classified: Secondary | ICD-10-CM | POA: Insufficient documentation

## 2017-02-28 NOTE — Progress Notes (Deleted)
* [facility]     Assessment and Plan:  OSA/OHS --Continue using trilogy vent device every night.  --Appears to be doing well, sleeping well overnight.  --Known severe obstructive sleep apnea with an AHI of 106, has been on home trilogy device. He has been intolerant of CPAP.   CHFpEF with volume overload.  --continue current management.   Acute hypoxic respiratory failure with pneumonia, complicated by possible pulmonary edema with volume overload-now doing better. -Continue diuresis. -Continue abx. - MRSA screen negative    Date: 02/28/2017  MRN# [id number] Raymond [name] 24-Jun-1965   Raymond [name] is a 52 y.o. old male seen in follow up for chief complaint of  No chief complaint on file.    HPI:   The patient is a 52 yo male with a history of COPD, OSA/OHS, CHF, volume overload.  Since his last visit here in December of 2018, he was admitted to the hospital for 1 week with pneumonia and pulmonary edema.  He has been maintained on a trilogy device.  He notes that he is doing well, and he is sleeping well, he notes that his breathing is much better with this device. He uses it every night, he usually puts it on at 5 am, and wakes at 2-3 pm. He describes himself as a night owl.  He has an advair inhaler but uses still rarely because he does not feel that he needs it. He goes to a gym 3 to 5 days per week and has no trouble with dyspnea. He can ride a stationary for about an hour without trouble.   Split night study; 05/21/14; AHI 106.The patient is a 52 yo male with a history; required CPAP @ 14, did not achieve supine REM.    Medication:   Outpatient Encounter Medications as of 03/01/2017  Medication Sig  . aspirin EC 81 MG tablet Take 81 mg by mouth daily.  . carvedilol (COREG) 12.5 MG tablet Take 1 tablet by mouth 2 (two) times daily.  . enalapril (VASOTEC) 20 MG tablet Take 40 mg by mouth daily.   Marland Kitchen gabapentin (NEURONTIN)  300 MG capsule Take 300 mg by mouth 3 (three) times daily.  . insulin detemir (LEVEMIR) 100 UNIT/ML injection Inject 89 Units into the skin 2 (two) times daily.   . insulin lispro (HUMALOG) 100 UNIT/ML cartridge Inject 60 Units into the skin 3 (three) times daily with meals.  . metFORMIN (GLUCOPHAGE) 1000 MG tablet Take 1,000 mg by mouth 2 (two) times daily with a meal.  . pantoprazole (PROTONIX) 40 MG tablet Take 40 mg by mouth daily.  Marland Kitchen torsemide (DEMADEX) 20 MG tablet Take 2 tablets (40 mg total) by mouth 2 (two) times daily.   No facility-administered encounter medications on file as of 03/01/2017.      Allergies:  Patient has no known allergies.  Review of Systems: Gen:  Denies  fever, sweats. HEENT: Denies blurred vision. Cvc:  No dizziness, chest pain or heaviness Resp:   Denies cough or sputum porduction. Gi: Denies swallowing difficulty, stomach pain. constipation, bowel incontinence Gu:  Denies bladder incontinence, burning urine Ext:   No Joint pain, stiffness. Skin: No skin rash, easy bruising. Endoc:  No polyuria, polydipsia. Psych: No depression, insomnia. Other:  All other systems were reviewed and found to be negative other than what is mentioned in the HPI.   Physical Examination:   VS: There were no vitals taken for this visit.  General Appearance: No distress  Neuro:without focal findings,  speech normal,  HEENT: PERRLA, EOM intact. Pulmonary: normal breath sounds, No wheezing.   CardiovascularNormal S1,S2.  No m/r/g.   Abdomen: Benign, Soft, non-tender. Renal:  No costovertebral tenderness  GU:  Not performed at this time. Endoc: No evident thyromegaly, no signs of acromegaly. Skin:   warm, no rash. Extremities: normal, no cyanosis, clubbing.   LABORATORY PANEL:   CBC No results for input(s): WBC, HGB, HCT, PLT in the last 168  hours. ------------------------------------------------------------------------------------------------------------------  Chemistries  No results for input(s): NA, K, CL, CO2, GLUCOSE, BUN, CREATININE, CALCIUM, MG, AST, ALT, ALKPHOS, BILITOT in the last 168 hours.  Invalid input(s): GFRCGP ------------------------------------------------------------------------------------------------------------------  Cardiac Enzymes No results for input(s): TROPONINI in the last 168 hours. ------------------------------------------------------------  RADIOLOGY:   No results found for this or any previous visit. Results for orders placed in visit on 03/26/10  DG Chest 2 View   Narrative * PRIOR REPORT IMPORTED FROM AN EXTERNAL SYSTEM*   PRIOR REPORT IMPORTED FROM THE SYNGO WORKFLOW SYSTEM   REASON FOR EXAM:    fever; elevated wbc  COMMENTS:   LMP: (Male)   PROCEDURE:     DXR - DXR CHEST PA (OR AP) AND LATERAL  - Mar 26 2010  10:40PM   RESULT:     Comparison is made to prior study dated 03/23/2009.   The patient has taken a shallow inspiration. Areas of increased density  project within the lung bases. These findings may represent vascular  crowding versus atelectasis or possibly bibasilar infiltrates. The cardiac  silhouette is moderately enlarged and the visualized bony skeleton is  unremarkable.   IMPRESSION:   1. Bibasilar densities as described above.   Thank you for the opportunity to contribute to the care of your patient.       ------------------------------------------------------------------------------------------------------------------  Thank  you for allowing [facility] to assist in the care of your patient. Our recommendations are noted above.  Please contact us if we can be of further service.   Marda Stalker, MD.  [facility] Office Number: [phone]  Patricia Pesa, M.D.  Merton Border, M.D  02/28/2017

## 2017-03-01 ENCOUNTER — Inpatient Hospital Stay: Payer: Medicare PPO | Admitting: Internal Medicine

## 2017-03-06 NOTE — Progress Notes (Signed)
* [facility]     Assessment and Plan:  OSA/OHS --Known severe obstructive sleep apnea with an AHI of 106, has been on home trilogy device. He has been intolerant of CPAP. --Continue using trilogy vent device every night.  --Appears to be doing well, sleeping well overnight.   CHFpEF with volume overload.  -Admitted to the hospital for heart failure in December 2018 --continue current management, and follow-up with cardiology.  Acute hypoxic respiratory failure with pneumonia, complicated by possible pulmonary edema with volume overload-now doing better. -Continue diuresis. -Continue abx. - MRSA screen negative    Date: 03/06/2017  MRN# [id number] Rowan Blaker Kerkhoff 1965/12/18   Jammy Plotkin Cerone is a 52 y.o. old male seen in follow up for chief complaint of  Chief Complaint  Patient presents with  . Sleep Apnea    pt on Triligy which he wears 8 hours or more.     HPI:   The patient is a 52 yo male with a history of COPD, OSA/OHS, CHF, volume overload.  Since his last visit here in December of 2018, he was admitted to the hospital for 1 week in December 2018 with pneumonia and pulmonary edema.  He has been maintained on a trilogy device. He has been told that he needs to lower his fluid intake. He has been wearing compression stockings which he feels has been helping with the leg edema.   He notes that he is doing well, and he is sleeping well, he notes that his breathing is much better with this device "that's my best friend!" He is a night owl,  usually puts it on at 5 am, and wakes at 2-3 pm.   He finds that the inhalers were not helping so he stopped them, and he noticed no difference. He continues to go to the gym 3 to 5 days per week and has no trouble with dyspnea. He can ride a stationary for about an hour without trouble.   Split night study; 05/21/14; AHI 106.The patient is a 52 yo male with a history; required CPAP @ 14, did not  achieve supine REM. Trilogy 100; AVAPS-AE: Pressure support max 30, pressure support minimum 6, EPAP max pressure 15, EPAP minimum pressure 4. -Review of data from 03/01/16 to average usage all days is 7 hours 2 minutes percent of days with less than 4 hours is 7.3.  12/13/16   Imaging personally reviewed, CT chest 01/16/17, bibasilar infiltrates consistent with pneumonia complicating pulmonary edema.    Medication:   Outpatient Encounter Medications as of 03/07/2017  Medication Sig  . aspirin EC 81 MG tablet Take 81 mg by mouth daily.  . carvedilol (COREG) 12.5 MG tablet Take 1 tablet by mouth 2 (two) times daily.  . enalapril (VASOTEC) 20 MG tablet Take 40 mg by mouth daily.   Marland Kitchen gabapentin (NEURONTIN) 300 MG capsule Take 300 mg by mouth 3 (three) times daily.  . insulin detemir (LEVEMIR) 100 UNIT/ML injection Inject 89 Units into the skin 2 (two) times daily.   . insulin lispro (HUMALOG) 100 UNIT/ML cartridge Inject 60 Units into the skin 3 (three) times daily with meals.  . metFORMIN (GLUCOPHAGE) 1000 MG tablet Take 1,000 mg by mouth 2 (two) times daily with a meal.  . pantoprazole (PROTONIX) 40 MG tablet Take 40 mg by mouth daily.  Marland Kitchen torsemide (DEMADEX) 20 MG tablet Take 2 tablets (40 mg total) by mouth 2 (two) times daily.   No facility-administered encounter medications on file  as of 03/07/2017.      Allergies:  Patient has no known allergies.  Review of Systems: Gen:  Denies  fever, sweats. HEENT: Denies blurred vision. Cvc:  No dizziness, chest pain or heaviness Resp:   Denies cough or sputum porduction. Gi: Denies swallowing difficulty, stomach pain. constipation, bowel incontinence Gu:  Denies bladder incontinence, burning urine Ext:   No Joint pain, stiffness. Skin: No skin rash, easy bruising. Endoc:  No polyuria, polydipsia. Psych: No depression, insomnia. Other:  All other systems were reviewed and found to be negative other than what is mentioned in the HPI.    Physical Examination:   VS: BP 134/64 (BP Location: Left Arm, Cuff Size: Large)   Pulse 74   Resp 16   Ht 6\' 2"  (1.88 m)   Wt (!) 368 lb (166.9 kg)   SpO2 97%   BMI 47.25 kg/m   General Appearance: No distress  Neuro:without focal findings,  speech normal,  HEENT: PERRLA, EOM intact. Pulmonary: normal breath sounds, No wheezing.   CardiovascularNormal S1,S2.  No m/r/g.   Abdomen: Benign, Soft, non-tender. Renal:  No costovertebral tenderness  GU:  Not performed at this time. Endoc: No evident thyromegaly, no signs of acromegaly. Skin:   warm, no rash. Extremities: normal, no cyanosis, clubbing.   LABORATORY PANEL:   CBC No results for input(s): WBC, HGB, HCT, PLT in the last 168 hours. ------------------------------------------------------------------------------------------------------------------  Chemistries  No results for input(s): NA, K, CL, CO2, GLUCOSE, BUN, CREATININE, CALCIUM, MG, AST, ALT, ALKPHOS, BILITOT in the last 168 hours.  Invalid input(s): GFRCGP ------------------------------------------------------------------------------------------------------------------  Cardiac Enzymes No results for input(s): TROPONINI in the last 168 hours. ------------------------------------------------------------  RADIOLOGY:   No results found for this or any previous visit. Results for orders placed in visit on 03/26/10  DG Chest 2 View   Narrative * PRIOR REPORT IMPORTED FROM AN EXTERNAL SYSTEM*   PRIOR REPORT IMPORTED FROM THE SYNGO WORKFLOW SYSTEM   REASON FOR EXAM:    fever; elevated wbc  COMMENTS:   LMP: (Male)   PROCEDURE:     DXR - DXR CHEST PA (OR AP) AND LATERAL  - Mar 26 2010  10:40PM   RESULT:     Comparison is made to prior study dated 03/23/2009.   The patient has taken a shallow inspiration. Areas of increased density  project within the lung bases. These findings may represent vascular  crowding versus atelectasis or possibly bibasilar  infiltrates. The cardiac  silhouette is moderately enlarged and the visualized bony skeleton is  unremarkable.   IMPRESSION:   1. Bibasilar densities as described above.   Thank you for the opportunity to contribute to the care of your patient.       ------------------------------------------------------------------------------------------------------------------  Thank  you for allowing [facility] [place] [facility] to assist in the care of your patient. Our recommendations are noted above.  Please contact us if we can be of further service.   Marda Stalker, MD.  [place] [facility] Office Number: [phone]  Patricia Pesa, M.D.  Merton Border, M.D  03/06/2017

## 2017-03-07 ENCOUNTER — Encounter: Payer: Self-pay | Admitting: Internal Medicine

## 2017-03-07 ENCOUNTER — Ambulatory Visit: Payer: Medicare PPO | Admitting: Internal Medicine

## 2017-03-07 DIAGNOSIS — J449 Chronic obstructive pulmonary disease, unspecified: Secondary | ICD-10-CM

## 2017-03-07 DIAGNOSIS — G4733 Obstructive sleep apnea (adult) (pediatric): Secondary | ICD-10-CM

## 2017-03-07 DIAGNOSIS — E662 Morbid (severe) obesity with alveolar hypoventilation: Secondary | ICD-10-CM

## 2017-03-07 NOTE — Patient Instructions (Signed)
--  Continue to use your sleep machine every night for the whole night.

## 2017-03-23 NOTE — Progress Notes (Signed)
**Note Raymond-Identified via Obfuscation** [name], male    DOB: [birth year], 52 y.o.   MRN: [id number]  HPI  Raymond [name] is a 52 y/o male with a history of DM, HTN, PVD, GERD, obstructive sleep apnea and chronic heart failure.   Echo report from 02/03/17 reviewed and showed an EF of 55-65% along with trivial Raymond/TR.  Admitted 01/13/17 due to HF. Chest CT done which showed HF and he was treated with IV diuretics and then transitioned to oral diuretics. Pulmonology consult was obtained. Discharged after 7 days. Was in the ED 11/21/16 due to abscess where he was treated and released.  He presents today for a follow-up visit with a chief complaint of edema in his lower legs. He describes this as chronic in nature having been present for several months. Does note improvement in edema since wearing compression socks. Does not have any associated symptoms. Denies any fatigue, chest pain, shortness of breath, palpitations, abdominal distention, dizziness, difficulty sleeping or weight gain.   Past Medical History:  Diagnosis Date  . Arthritis    hands  . Cellulitis   . CHF (congestive heart failure) ([place])   . Chronic respiratory failure with hypoxia (HCC)    2 L [place] continuously  . Diabetes ([place])   . Edema    FEET/LEGS  . GERD (gastroesophageal reflux disease)   . Hepatic steatosis   . HOH (hard of hearing)   . Hypertension   . Morbid obesity ([place])   . Neuropathy   . Orthopnea   . OSA on CPAP    TRILOGY VENTILATOR  . Oxygen deficiency    2L  [place]  . PVD (peripheral vascular disease) ([place])   . Shortness of breath dyspnea   . Systolic heart failure (HCC)    Preserved EF 50-55%   Past Surgical History:  Procedure Laterality Date  . AMPUTATION Right 10/02/2014   Procedure: AMPUTATION RAY;  Surgeon: Albertine Patricia, DPM;  Location: [facility] ORS;  Service: Podiatry;  Laterality: Right;  . CATARACT EXTRACTION W/PHACO Right 09/10/2015   Procedure: CATARACT EXTRACTION PHACO AND INTRAOCULAR LENS PLACEMENT  (IOC);  Surgeon: Birder Robson, MD;  Location: [facility] ORS;  Service: Ophthalmology;  Laterality: Right;  [place] 01:34 AP% 19.3 CDE 18.36 Fluid pack lot # [id number]  . CATARACT EXTRACTION W/PHACO Left 10/06/2015   Procedure: CATARACT EXTRACTION PHACO AND INTRAOCULAR LENS PLACEMENT (IOC);  Surgeon: Birder Robson, MD;  Location: [facility] ORS;  Service: Ophthalmology;  Laterality: Left;  [place] 00:56 AP% 19.5 CDE 11.02 Fluid Pack # [id number]  . CHOLECYSTECTOMY    . COLONOSCOPY WITH PROPOFOL N/A 05/17/2016   Procedure: COLONOSCOPY WITH PROPOFOL;  Surgeon: Lollie Sails, MD;  Location: [facility] ENDOSCOPY;  Service: Endoscopy;  Laterality: N/A;  . EYE SURGERY Left    bilat laser  . HIP SURGERY Right    8 th grade pins  . NOSE SURGERY    . PERIPHERAL VASCULAR CATHETERIZATION Right 10/02/2014   Procedure: Lower Extremity Angiography;  Surgeon: Algernon Huxley, MD;  Location: [facility];  Service: Cardiovascular;  Laterality: Right;  . TOE AMPUTATION Left 2010   2cd  . TOE AMPUTATION Right 2009   Family History  Problem Relation Age of Onset  . Hypertension Mother   . Diabetes Mother   . Stroke Father   . Hypertension Father   . Heart attack Father    Social History   Tobacco Use  . Smoking status: Never Smoker  . Smokeless tobacco: Never Used  Substance Use Topics  .  Alcohol use: No    Alcohol/week: 0.0 oz   No Known Allergies  Prior to Admission medications   Medication Sig Start Date End Date Taking? Authorizing Provider  aspirin EC 81 MG tablet Take 81 mg by mouth daily.   Yes [provider]  carvedilol (COREG) 12.5 MG tablet Take 1 tablet by mouth 2 (two) times daily. 02/09/17 02/09/18 Yes [provider]  enalapril (VASOTEC) 20 MG tablet Take 40 mg by mouth daily.    Yes [provider]  gabapentin (NEURONTIN) 300 MG capsule Take 300 mg by mouth 3 (three) times daily.   Yes [provider]  insulin detemir (LEVEMIR) 100 UNIT/ML injection Inject 89  Units into the skin 2 (two) times daily.    Yes [provider]  insulin lispro (HUMALOG) 100 UNIT/ML cartridge Inject 60 Units into the skin 3 (three) times daily with meals.   Yes [provider]  metFORMIN (GLUCOPHAGE) 1000 MG tablet Take 1,000 mg by mouth 2 (two) times daily with a meal.   Yes [provider]  pantoprazole (PROTONIX) 40 MG tablet Take 40 mg by mouth daily.   Yes [provider]  spironolactone (ALDACTONE) 25 MG tablet Take 25 mg by mouth daily.   Yes [provider]  torsemide (DEMADEX) 20 MG tablet Take 2 tablets (40 mg total) by mouth 2 (two) times daily. 02/01/17 03/03/17  Raymond Graff, FNP    Review of Systems  Constitutional: Negative for appetite change and fatigue.  HENT: Negative for congestion, postnasal drip and sore throat.   Eyes: Negative.   Respiratory: Negative for chest tightness and shortness of breath.   Cardiovascular: Positive for leg swelling. Negative for chest pain and palpitations.  Gastrointestinal: Negative for abdominal distention and abdominal pain.  Endocrine: Negative.   Genitourinary: Negative.   Musculoskeletal: Negative for back pain and neck pain.  Skin: Negative.   Allergic/Immunologic: Negative.   Neurological: Negative for dizziness and light-headedness.  Hematological: Negative for adenopathy. Does not bruise/bleed easily.  Psychiatric/Behavioral: Negative for dysphoric mood and sleep disturbance (wearing triology at bedtime). The patient is not nervous/anxious.    Vitals:   03/24/17 1348  BP: (!) 113/58  Pulse: 71  Resp: 18  SpO2: 95%  Weight: (!) 373 lb 2 oz (169.2 kg)  Height: 6\' 2"  (1.88 m)   Wt Readings from Last 3 Encounters:  03/24/17 (!) 373 lb 2 oz (169.2 kg)  03/07/17 (!) 368 lb (166.9 kg)  02/10/17 (!) 376 lb 4 oz (170.7 kg)   Lab Results  Component Value Date   CREATININE 1.19 01/20/2017   CREATININE 1.34 (H) 01/19/2017   CREATININE 1.09 01/17/2017     Physical Exam  Constitutional: He is oriented to person, place, and time. He appears well-developed and well-nourished.  HENT:  Head: Normocephalic and atraumatic.  Neck: Normal range of motion. Neck supple. No JVD present.  Cardiovascular: Normal rate and regular rhythm.  Pulmonary/Chest: Effort normal. He has no wheezes. He has no rales.  Abdominal: Soft. He exhibits no distension. There is no tenderness.  Musculoskeletal: He exhibits edema (1+ pitting edema in bilateral lower legs). He exhibits no tenderness.  Neurological: He is alert and oriented to person, place, and time.  Skin: Skin is warm and dry.  Psychiatric: He has a normal mood and affect. His behavior is normal. Thought content normal.  Nursing note and vitals reviewed.   Assessment & Plan:  1: Chronic heart failure with preserved ejection fraction- - NYHA class  I - mildly fluid overloaded today although edema is better - weighing daily and he was reminded to call for an overnight weight gain of >2 pounds or a weekly weight gain of >5 pounds - weight down 3 pounds since he was last here - has been diligently reading food labels - not adding salt and reminded to closely following a 2000mg  sodium diet - drinks ~ 76 ounces of fluid daily. Has decreased consumption from ~ 96 ounces daily - saw cardiologist Melvern Sample) 02/09/17 - patient reports receiving his flu vaccine for this season  2: HTN- - BP looks good today - BMP from 02/09/17 reviewed and showed sodium 143, potassium 5.3 and GFR 43  3: Obstructive sleep apnea-  - wears trilogy at bedtime & is sleeping well - saw pulmonologist Ashby Dawes) 03/07/17  4: Diabetes, type 2-  - nonfasting glucose in clinic today is 162 - A1c on 09/30/14 was 6.6% - follows with PCP [facility]) regarding this - will make a referral to [facility]. Patient is interested in learning more about his diet.   5: Lymphedema- - elevating his legs more often - wearing  compression socks daily with improvement in edema - has been walking more - consider lymphapress compression boots in the future if needed  Patient did not bring his medications nor a list. Each medication was verbally reviewed with the patient and he was encouraged to bring the bottles to every visit to confirm accuracy of list.  Return in 6 months or sooner for any questions/problems before then.

## 2017-03-24 ENCOUNTER — Ambulatory Visit: Payer: Medicare PPO | Attending: Family | Admitting: Family

## 2017-03-24 ENCOUNTER — Encounter: Payer: Self-pay | Admitting: Family

## 2017-03-24 VITALS — BP 113/58 | HR 71 | Resp 18 | Ht 74.0 in | Wt 373.1 lb

## 2017-03-24 DIAGNOSIS — Z7982 Long term (current) use of aspirin: Secondary | ICD-10-CM | POA: Diagnosis not present

## 2017-03-24 DIAGNOSIS — Z79899 Other long term (current) drug therapy: Secondary | ICD-10-CM | POA: Insufficient documentation

## 2017-03-24 DIAGNOSIS — I5032 Chronic diastolic (congestive) heart failure: Secondary | ICD-10-CM | POA: Diagnosis present

## 2017-03-24 DIAGNOSIS — E119 Type 2 diabetes mellitus without complications: Secondary | ICD-10-CM

## 2017-03-24 DIAGNOSIS — J9611 Chronic respiratory failure with hypoxia: Secondary | ICD-10-CM | POA: Diagnosis not present

## 2017-03-24 DIAGNOSIS — Z9049 Acquired absence of other specified parts of digestive tract: Secondary | ICD-10-CM | POA: Insufficient documentation

## 2017-03-24 DIAGNOSIS — I890 Lymphedema, not elsewhere classified: Secondary | ICD-10-CM | POA: Insufficient documentation

## 2017-03-24 DIAGNOSIS — Z794 Long term (current) use of insulin: Secondary | ICD-10-CM | POA: Diagnosis not present

## 2017-03-24 DIAGNOSIS — E1151 Type 2 diabetes mellitus with diabetic peripheral angiopathy without gangrene: Secondary | ICD-10-CM | POA: Diagnosis not present

## 2017-03-24 DIAGNOSIS — Z9981 Dependence on supplemental oxygen: Secondary | ICD-10-CM | POA: Insufficient documentation

## 2017-03-24 DIAGNOSIS — K219 Gastro-esophageal reflux disease without esophagitis: Secondary | ICD-10-CM | POA: Diagnosis not present

## 2017-03-24 DIAGNOSIS — G4733 Obstructive sleep apnea (adult) (pediatric): Secondary | ICD-10-CM | POA: Diagnosis not present

## 2017-03-24 DIAGNOSIS — E1140 Type 2 diabetes mellitus with diabetic neuropathy, unspecified: Secondary | ICD-10-CM | POA: Insufficient documentation

## 2017-03-24 DIAGNOSIS — I110 Hypertensive heart disease with heart failure: Secondary | ICD-10-CM | POA: Diagnosis not present

## 2017-03-24 DIAGNOSIS — I10 Essential (primary) hypertension: Secondary | ICD-10-CM

## 2017-03-24 LAB — GLUCOSE, CAPILLARY: Glucose-Capillary: 162 mg/dL — ABNORMAL HIGH (ref 65–99)

## 2017-03-24 NOTE — Patient Instructions (Signed)
Continue weighing daily and call for an overnight weight gain of > 2 pounds or a weekly weight gain of >5 pounds. 

## 2017-04-28 ENCOUNTER — Other Ambulatory Visit: Payer: Self-pay

## 2017-04-28 ENCOUNTER — Emergency Department (HOSPITAL_COMMUNITY): Payer: Medicare Other

## 2017-04-28 ENCOUNTER — Encounter (HOSPITAL_COMMUNITY): Payer: Self-pay | Admitting: Emergency Medicine

## 2017-04-28 ENCOUNTER — Inpatient Hospital Stay (HOSPITAL_COMMUNITY)
Admission: EM | Admit: 2017-04-28 | Discharge: 2017-04-30 | DRG: 682 | Disposition: A | Discharge status: Home / Self Care | Payer: Medicare Other | Attending: Internal Medicine | Admitting: Internal Medicine

## 2017-04-28 DIAGNOSIS — J9621 Acute and chronic respiratory failure with hypoxia: Secondary | ICD-10-CM | POA: Diagnosis present

## 2017-04-28 DIAGNOSIS — E1151 Type 2 diabetes mellitus with diabetic peripheral angiopathy without gangrene: Secondary | ICD-10-CM | POA: Diagnosis present

## 2017-04-28 DIAGNOSIS — E1165 Type 2 diabetes mellitus with hyperglycemia: Secondary | ICD-10-CM | POA: Diagnosis present

## 2017-04-28 DIAGNOSIS — E119 Type 2 diabetes mellitus without complications: Secondary | ICD-10-CM | POA: Diagnosis not present

## 2017-04-28 DIAGNOSIS — E872 Acidosis: Secondary | ICD-10-CM | POA: Diagnosis not present

## 2017-04-28 DIAGNOSIS — G4733 Obstructive sleep apnea (adult) (pediatric): Secondary | ICD-10-CM

## 2017-04-28 DIAGNOSIS — Z9841 Cataract extraction status, right eye: Secondary | ICD-10-CM

## 2017-04-28 DIAGNOSIS — E860 Dehydration: Secondary | ICD-10-CM | POA: Diagnosis present

## 2017-04-28 DIAGNOSIS — R079 Chest pain, unspecified: Secondary | ICD-10-CM | POA: Diagnosis present

## 2017-04-28 DIAGNOSIS — I10 Essential (primary) hypertension: Secondary | ICD-10-CM | POA: Diagnosis not present

## 2017-04-28 DIAGNOSIS — R072 Precordial pain: Secondary | ICD-10-CM

## 2017-04-28 DIAGNOSIS — Z89421 Acquired absence of other right toe(s): Secondary | ICD-10-CM

## 2017-04-28 DIAGNOSIS — E662 Morbid (severe) obesity with alveolar hypoventilation: Secondary | ICD-10-CM | POA: Diagnosis present

## 2017-04-28 DIAGNOSIS — Z6841 Body Mass Index (BMI) 40.0 and over, adult: Secondary | ICD-10-CM

## 2017-04-28 DIAGNOSIS — R0789 Other chest pain: Secondary | ICD-10-CM | POA: Diagnosis not present

## 2017-04-28 DIAGNOSIS — Z9049 Acquired absence of other specified parts of digestive tract: Secondary | ICD-10-CM

## 2017-04-28 DIAGNOSIS — Z89422 Acquired absence of other left toe(s): Secondary | ICD-10-CM

## 2017-04-28 DIAGNOSIS — E1122 Type 2 diabetes mellitus with diabetic chronic kidney disease: Secondary | ICD-10-CM | POA: Diagnosis not present

## 2017-04-28 DIAGNOSIS — E785 Hyperlipidemia, unspecified: Secondary | ICD-10-CM | POA: Diagnosis present

## 2017-04-28 DIAGNOSIS — Z89431 Acquired absence of right foot: Secondary | ICD-10-CM

## 2017-04-28 DIAGNOSIS — Y92009 Unspecified place in unspecified non-institutional (private) residence as the place of occurrence of the external cause: Secondary | ICD-10-CM

## 2017-04-28 DIAGNOSIS — K219 Gastro-esophageal reflux disease without esophagitis: Secondary | ICD-10-CM | POA: Diagnosis not present

## 2017-04-28 DIAGNOSIS — J9600 Acute respiratory failure, unspecified whether with hypoxia or hypercapnia: Secondary | ICD-10-CM

## 2017-04-28 DIAGNOSIS — Z7982 Long term (current) use of aspirin: Secondary | ICD-10-CM

## 2017-04-28 DIAGNOSIS — T383X5A Adverse effect of insulin and oral hypoglycemic [antidiabetic] drugs, initial encounter: Secondary | ICD-10-CM | POA: Diagnosis present

## 2017-04-28 DIAGNOSIS — Z9119 Patient's noncompliance with other medical treatment and regimen: Secondary | ICD-10-CM

## 2017-04-28 DIAGNOSIS — I739 Peripheral vascular disease, unspecified: Secondary | ICD-10-CM | POA: Diagnosis present

## 2017-04-28 DIAGNOSIS — Z23 Encounter for immunization: Secondary | ICD-10-CM

## 2017-04-28 DIAGNOSIS — E875 Hyperkalemia: Secondary | ICD-10-CM | POA: Diagnosis not present

## 2017-04-28 DIAGNOSIS — E861 Hypovolemia: Secondary | ICD-10-CM | POA: Diagnosis not present

## 2017-04-28 DIAGNOSIS — N179 Acute kidney failure, unspecified: Secondary | ICD-10-CM | POA: Diagnosis not present

## 2017-04-28 DIAGNOSIS — R0602 Shortness of breath: Secondary | ICD-10-CM

## 2017-04-28 DIAGNOSIS — I9589 Other hypotension: Secondary | ICD-10-CM | POA: Diagnosis not present

## 2017-04-28 DIAGNOSIS — N182 Chronic kidney disease, stage 2 (mild): Secondary | ICD-10-CM | POA: Diagnosis present

## 2017-04-28 DIAGNOSIS — I130 Hypertensive heart and chronic kidney disease with heart failure and stage 1 through stage 4 chronic kidney disease, or unspecified chronic kidney disease: Secondary | ICD-10-CM | POA: Diagnosis present

## 2017-04-28 DIAGNOSIS — M19041 Primary osteoarthritis, right hand: Secondary | ICD-10-CM | POA: Diagnosis present

## 2017-04-28 DIAGNOSIS — I952 Hypotension due to drugs: Secondary | ICD-10-CM | POA: Diagnosis not present

## 2017-04-28 DIAGNOSIS — R55 Syncope and collapse: Secondary | ICD-10-CM

## 2017-04-28 DIAGNOSIS — E1140 Type 2 diabetes mellitus with diabetic neuropathy, unspecified: Secondary | ICD-10-CM | POA: Diagnosis present

## 2017-04-28 DIAGNOSIS — Z79899 Other long term (current) drug therapy: Secondary | ICD-10-CM

## 2017-04-28 DIAGNOSIS — M19042 Primary osteoarthritis, left hand: Secondary | ICD-10-CM | POA: Diagnosis present

## 2017-04-28 DIAGNOSIS — T502X5A Adverse effect of carbonic-anhydrase inhibitors, benzothiadiazides and other diuretics, initial encounter: Secondary | ICD-10-CM | POA: Diagnosis present

## 2017-04-28 DIAGNOSIS — Z9989 Dependence on other enabling machines and devices: Secondary | ICD-10-CM

## 2017-04-28 DIAGNOSIS — I5032 Chronic diastolic (congestive) heart failure: Secondary | ICD-10-CM | POA: Diagnosis present

## 2017-04-28 DIAGNOSIS — Z833 Family history of diabetes mellitus: Secondary | ICD-10-CM

## 2017-04-28 DIAGNOSIS — Z9111 Patient's noncompliance with dietary regimen: Secondary | ICD-10-CM

## 2017-04-28 DIAGNOSIS — Z8249 Family history of ischemic heart disease and other diseases of the circulatory system: Secondary | ICD-10-CM

## 2017-04-28 DIAGNOSIS — Z794 Long term (current) use of insulin: Secondary | ICD-10-CM | POA: Diagnosis not present

## 2017-04-28 DIAGNOSIS — I959 Hypotension, unspecified: Secondary | ICD-10-CM | POA: Diagnosis present

## 2017-04-28 DIAGNOSIS — Z9842 Cataract extraction status, left eye: Secondary | ICD-10-CM

## 2017-04-28 DIAGNOSIS — Z961 Presence of intraocular lens: Secondary | ICD-10-CM | POA: Diagnosis present

## 2017-04-28 DIAGNOSIS — T464X5A Adverse effect of angiotensin-converting-enzyme inhibitors, initial encounter: Secondary | ICD-10-CM | POA: Diagnosis present

## 2017-04-28 DIAGNOSIS — H9190 Unspecified hearing loss, unspecified ear: Secondary | ICD-10-CM | POA: Diagnosis present

## 2017-04-28 HISTORY — DX: Morbid (severe) obesity due to excess calories: E66.01

## 2017-04-28 HISTORY — DX: Chronic diastolic (congestive) heart failure: I50.32

## 2017-04-28 HISTORY — DX: Acute kidney failure, unspecified: N17.9

## 2017-04-28 HISTORY — DX: Type 2 diabetes mellitus without complications: E11.9

## 2017-04-28 HISTORY — DX: Type 2 diabetes mellitus without complications: Z79.4

## 2017-04-28 HISTORY — DX: Body Mass Index (BMI) 40.0 and over, adult: Z684

## 2017-04-28 LAB — RESPIRATORY PANEL BY PCR

## 2017-04-28 LAB — URINALYSIS, ROUTINE W REFLEX MICROSCOPIC
Bilirubin Urine: NEGATIVE
Glucose, UA: NEGATIVE mg/dL
Hgb urine dipstick: NEGATIVE
Ketones, ur: NEGATIVE mg/dL
Leukocytes, UA: NEGATIVE
Nitrite: NEGATIVE
Protein, ur: NEGATIVE mg/dL
Specific Gravity, Urine: 1.006 (ref 1.005–1.030)
pH: 5 (ref 5.0–8.0)

## 2017-04-28 LAB — CBC
HCT: 31.6 % — ABNORMAL LOW (ref 39.0–52.0)
Hemoglobin: 10.2 g/dL — ABNORMAL LOW (ref 13.0–17.0)
MCH: 26.8 pg (ref 26.0–34.0)
MCHC: 32.3 g/dL (ref 30.0–36.0)
MCV: 83.2 fL (ref 78.0–100.0)
Platelets: 333 10*3/uL (ref 150–400)
RBC: 3.8 MIL/uL — ABNORMAL LOW (ref 4.22–5.81)
RDW: 14.2 % (ref 11.5–15.5)
WBC: 9 10*3/uL (ref 4.0–10.5)

## 2017-04-28 LAB — RAPID URINE DRUG SCREEN, HOSP PERFORMED
Amphetamines: NOT DETECTED
Barbiturates: NOT DETECTED
Benzodiazepines: NOT DETECTED
Cocaine: NOT DETECTED
Opiates: NOT DETECTED
Tetrahydrocannabinol: NOT DETECTED

## 2017-04-28 LAB — BASIC METABOLIC PANEL
Anion gap: 11 (ref 5–15)
BUN: 69 mg/dL — ABNORMAL HIGH (ref 6–20)
CO2: 22 mmol/L (ref 22–32)
Calcium: 8.9 mg/dL (ref 8.9–10.3)
Chloride: 105 mmol/L (ref 101–111)
Creatinine, Ser: 2.69 mg/dL — ABNORMAL HIGH (ref 0.61–1.24)
GFR calc Af Amer: 29 mL/min — ABNORMAL LOW (ref >60)
GFR calc non Af Amer: 25 mL/min — ABNORMAL LOW (ref >60)
Glucose, Bld: 108 mg/dL — ABNORMAL HIGH (ref 65–99)
Potassium: 5.1 mmol/L (ref 3.5–5.1)
Sodium: 138 mmol/L (ref 135–145)

## 2017-04-28 LAB — HEPATIC FUNCTION PANEL
ALT: 18 U/L (ref 17–63)
AST: 17 U/L (ref 15–41)
Albumin: 3.6 g/dL (ref 3.5–5.0)
Alkaline Phosphatase: 76 U/L (ref 38–126)
Bilirubin, Direct: <0.1 mg/dL — ABNORMAL LOW (ref 0.1–0.5)
Total Bilirubin: 0.6 mg/dL (ref 0.3–1.2)
Total Protein: 6.7 g/dL (ref 6.5–8.1)

## 2017-04-28 LAB — CBG MONITORING, ED
Glucose-Capillary: 114 mg/dL — ABNORMAL HIGH (ref 65–99)
Glucose-Capillary: 190 mg/dL — ABNORMAL HIGH (ref 65–99)

## 2017-04-28 LAB — D-DIMER, QUANTITATIVE: D-Dimer, Quant: 3.05 ug/mL-FEU — ABNORMAL HIGH (ref 0.00–0.50)

## 2017-04-28 LAB — PROTIME-INR
INR: 1.07
Prothrombin Time: 13.9 seconds (ref 11.4–15.2)

## 2017-04-28 LAB — I-STAT TROPONIN, ED: Troponin i, poc: 0 ng/mL (ref 0.00–0.08)

## 2017-04-28 LAB — HEPARIN LEVEL (UNFRACTIONATED): Heparin Unfractionated: 0.33 IU/mL (ref 0.30–0.70)

## 2017-04-28 LAB — I-STAT CG4 LACTIC ACID, ED
Lactic Acid, Venous: 2.23 mmol/L (ref 0.5–1.9)
Lactic Acid, Venous: 1.8 mmol/L (ref 0.5–1.9)

## 2017-04-28 LAB — HEMOGLOBIN A1C
Hgb A1c MFr Bld: 9 % — ABNORMAL HIGH (ref 4.8–5.6)
Mean Plasma Glucose: 211.6 mg/dL

## 2017-04-28 LAB — TROPONIN I
Troponin I: <0.03 ng/mL (ref <0.03)
Troponin I: <0.03 ng/mL (ref <0.03)

## 2017-04-28 LAB — GLUCOSE, CAPILLARY: Glucose-Capillary: 226 mg/dL — ABNORMAL HIGH (ref 65–99)

## 2017-04-28 LAB — BRAIN NATRIURETIC PEPTIDE: B Natriuretic Peptide: 14.2 pg/mL (ref 0.0–100.0)

## 2017-04-28 LAB — ECHOCARDIOGRAM COMPLETE
Height: 73 in
Weight: 5968 oz

## 2017-04-28 MED ORDER — SODIUM CHLORIDE 0.9% FLUSH
3.0000 mL | Freq: Two times a day (BID) | INTRAVENOUS | Status: DC
  Administered 2017-04-28 – 2017-04-30 (×4): 3 mL via INTRAVENOUS

## 2017-04-28 MED ORDER — HEPARIN BOLUS VIA INFUSION
7500.0000 [IU] | Freq: Once | INTRAVENOUS | Status: AC
  Administered 2017-04-28: 7500 [IU] via INTRAVENOUS
  Filled 2017-04-28: qty 7500

## 2017-04-28 MED ORDER — SODIUM CHLORIDE 0.9 % IV SOLN
INTRAVENOUS | Status: DC
  Administered 2017-04-28 – 2017-04-29 (×3): via INTRAVENOUS

## 2017-04-28 MED ORDER — PERFLUTREN LIPID MICROSPHERE
1.0000 mL | INTRAVENOUS | Status: AC | PRN
  Administered 2017-04-28: 4 mL via INTRAVENOUS
  Filled 2017-04-28: qty 10

## 2017-04-28 MED ORDER — INSULIN DETEMIR 100 UNIT/ML ~~LOC~~ SOLN
89.0000 [IU] | Freq: Two times a day (BID) | SUBCUTANEOUS | Status: DC
  Administered 2017-04-28 – 2017-04-30 (×4): 89 [IU] via SUBCUTANEOUS
  Filled 2017-04-28 (×5): qty 0.89

## 2017-04-28 MED ORDER — ONDANSETRON HCL 4 MG/2ML IJ SOLN: 4.0000 mg | Freq: Four times a day (QID) | INTRAMUSCULAR | Status: DC | PRN

## 2017-04-28 MED ORDER — HEPARIN (PORCINE) IN NACL 100-0.45 UNIT/ML-% IJ SOLN
2100.0000 [IU]/h | INTRAMUSCULAR | Status: DC
  Administered 2017-04-28: 2000 [IU]/h via INTRAVENOUS
  Administered 2017-04-29: 2100 [IU]/h via INTRAVENOUS
  Filled 2017-04-28 (×4): qty 250

## 2017-04-28 MED ORDER — ASPIRIN EC 81 MG PO TBEC
81.0000 mg | DELAYED_RELEASE_TABLET | Freq: Every day | ORAL | Status: DC
  Administered 2017-04-29 – 2017-04-30 (×2): 81 mg via ORAL
  Filled 2017-04-28 (×2): qty 1

## 2017-04-28 MED ORDER — INFLUENZA VAC SPLIT QUAD 0.5 ML IM SUSY
0.5000 mL | PREFILLED_SYRINGE | INTRAMUSCULAR | Status: AC
  Administered 2017-04-29: 0.5 mL via INTRAMUSCULAR
  Filled 2017-04-28: qty 0.5

## 2017-04-28 MED ORDER — ACETAMINOPHEN 650 MG RE SUPP: 650.0000 mg | Freq: Four times a day (QID) | RECTAL | Status: DC | PRN

## 2017-04-28 MED ORDER — INSULIN ASPART 100 UNIT/ML ~~LOC~~ SOLN
0.0000 [IU] | Freq: Three times a day (TID) | SUBCUTANEOUS | Status: DC
  Administered 2017-04-28: 3 [IU] via SUBCUTANEOUS
  Administered 2017-04-29: 5 [IU] via SUBCUTANEOUS
  Administered 2017-04-29 – 2017-04-30 (×3): 3 [IU] via SUBCUTANEOUS

## 2017-04-28 MED ORDER — OMEGA-3-ACID ETHYL ESTERS 1 G PO CAPS
1.0000 g | ORAL_CAPSULE | Freq: Every day | ORAL | Status: DC
  Administered 2017-04-29 – 2017-04-30 (×2): 1 g via ORAL
  Filled 2017-04-28 (×2): qty 1

## 2017-04-28 MED ORDER — INSULIN ASPART 100 UNIT/ML ~~LOC~~ SOLN
0.0000 [IU] | Freq: Every day | SUBCUTANEOUS | Status: DC
  Administered 2017-04-28: 2 [IU] via SUBCUTANEOUS
  Filled 2017-04-28: qty 1

## 2017-04-28 MED ORDER — SODIUM CHLORIDE 0.9 % IV BOLUS
1000.0000 mL | Freq: Once | INTRAVENOUS | Status: AC
  Administered 2017-04-28: 1000 mL via INTRAVENOUS

## 2017-04-28 MED ORDER — GABAPENTIN 300 MG PO CAPS
300.0000 mg | ORAL_CAPSULE | Freq: Three times a day (TID) | ORAL | Status: DC
  Administered 2017-04-28 – 2017-04-30 (×5): 300 mg via ORAL
  Filled 2017-04-28 (×5): qty 1

## 2017-04-28 MED ORDER — ACETAMINOPHEN 325 MG PO TABS: 650.0000 mg | ORAL_TABLET | Freq: Four times a day (QID) | ORAL | Status: DC | PRN

## 2017-04-28 MED ORDER — ONDANSETRON HCL 4 MG PO TABS: 4.0000 mg | ORAL_TABLET | Freq: Four times a day (QID) | ORAL | Status: DC | PRN

## 2017-04-28 MED ORDER — SODIUM CHLORIDE 0.9 % IV BOLUS
500.0000 mL | Freq: Once | INTRAVENOUS | Status: AC
  Administered 2017-04-28: 500 mL via INTRAVENOUS

## 2017-04-28 MED ORDER — ORAL CARE MOUTH RINSE
15.0000 mL | Freq: Two times a day (BID) | OROMUCOSAL | Status: DC
  Administered 2017-04-28 – 2017-04-30 (×3): 15 mL via OROMUCOSAL

## 2017-04-28 NOTE — H&P (Addendum)
History and Physical    Raymond [name] UXL:[id number] DOB: September 25, 1965 DOA: 04/28/2017  **Will admit patient based on the expectation that the patient will need hospitalization/ hospital care that crosses at least 2 midnights  PCP: [facility]   Attending physician: Aggie Moats  Patient coming from/Resides with: Private residence  Chief Complaint: Generalized weakness  HPI: Raymond [name] is a 52 y.o. male with medical history significant for diastolic heart failure on diuretics, chronic hypoxemic respiratory failure in the context of OHS/OSA, hypertension, dyslipidemia, morbid obesity, diabetes on insulin, peripheral vascular disease. Patient reports that he was on a field trip with his son while walking became extremely weak.  He initially felt this was related to his CBG being low so he ate some food.  He felt a little better and got up to walk but began staggering.  He sat down and attempted to walk again but began staggering and had mild central chest pressure and shortness of breath.  EMS was called.  Patient was noted to have O2 sat of 86% on room air.  He was given aspirin in the 250 cc normal saline bolus because his blood pressure was 82/41.  His CBG was 92.  He was in normal sinus rhythm and was mentating appropriately.  After arrival to the ER his blood pressure has been suboptimal for him with readings of 93/52 as well as 87/55.  Laboratory data revealed an acute kidney injury with BUN of 69 and a creatinine of 2.69.  BNP was normal.  Initial point-of-care troponin was normal.  Initial lactic acid was 2.23 so he was given a total of 1500 more cc of fluid with a repeat lactic acid 1.8.  Because of the hypoxemia, low blood pressure near syncope symptoms a d-dimer was checked due to concerns of possible PE and this was elevated at 3.05.  Because of acute kidney injury CTA of the chest was unable to be pursued.  Audiology was consulted.  EDP and  cardiology agreed that empiric treatment with IV heparin until PE ruled out was indicated.  Stat echocardiogram has been completed but has not been resulted.  In further discussion with the patient he tells me he is supposed to be wearing oxygen all the time but does not.  He does not have an oxygen concentrator at home.  He describes the same symptoms as above regarding the weakness, dizziness and chest discomfort and shortness of breath.  In discussing medications he reports that in December his Lasix was changed to [facility] and because of this carvedilol dosage was decreased.  He has not had any other changes in medications.  He has not had any nausea, vomiting, or diarrhea.  He has not had any cough or upper respiratory symptoms or fever.   ED Course:  Vital Signs: BP (!) 113/57   Pulse 68   Temp (!) 97.4 F (36.3 C) (Oral)   Resp 15   Ht 6\' 1"  (1.854 m)   Wt (!) 169.2 kg (373 lb)   SpO2 98%   BMI 49.21 kg/m  Chest x-ray: Impaired with study 3 months prior bibasilar airspace opacities have partially cleared but not completely.  Suggestion of possible mild pulmonary edema. Lab data: Sodium 138, potassium 5.1, chloride 105, CO2 22, glucose 108, BUN 69, creatinine 2.69, LFTs not elevated, BNP 14.2, point-of-care troponin normal, lactic acid 2.23 with follow-up 1.80, white count 9000 differential not obtained, hemoglobin 10.2, platelets 333,000, coags normal, d-dimer 3.05, urinalysis unremarkable except for  straw-colored appearance.  Urine drug screen negative Medications and treatments: NS bolus times 1500 cc  Review of Systems:  In addition to the HPI above,  No Fever-chills, myalgias or other constitutional symptoms No Headache, changes with Vision or hearing, new weakness, tingling, numbness in any extremity, dysarthria or word finding difficulty, tremors or seizure activity No problems swallowing food or Liquids, indigestion/reflux, choking or coughing while eating, abdominal pain with or  after eating No Chest pain, Cough or Shortness of Breath, palpitations, orthopnea or DOE No Abdominal pain, N/V, melena,hematochezia, dark tarry stools, constipation No dysuria, malodorous urine, hematuria or flank pain No new skin rashes, lesions, masses or bruises, No new joint pains, aches, swelling or redness No recent unintentional weight gain or loss No polyuria, polydypsia or polyphagia   Past Medical History:  Diagnosis Date  . Arthritis    hands  . Cellulitis   . CHF (congestive heart failure) ([place])   . Chronic diastolic heart failure ([place])   . Chronic respiratory failure with hypoxia (HCC)    2 L [place] continuously  . Diabetes mellitus type 2, insulin dependent ([place])   . Edema    FEET/LEGS  . GERD (gastroesophageal reflux disease)   . Hepatic steatosis   . HOH (hard of hearing)   . Hypertension   . Morbid obesity ([place])   . Morbid obesity with BMI of 45.0-49.9, adult ([place])   . Neuropathy   . Orthopnea   . OSA on CPAP    TRILOGY VENTILATOR  . Oxygen deficiency    2L  HS  . PVD (peripheral vascular disease) ([place])   . Shortness of breath dyspnea   . Systolic heart failure (HCC)    Preserved EF 50-55%    Past Surgical History:  Procedure Laterality Date  . AMPUTATION Right 10/02/2014   Procedure: AMPUTATION RAY;  Surgeon: Albertine Patricia, DPM;  Location: [facility] ORS;  Service: Podiatry;  Laterality: Right;  . CATARACT EXTRACTION W/PHACO Right 09/10/2015   Procedure: CATARACT EXTRACTION PHACO AND INTRAOCULAR LENS PLACEMENT (IOC);  Surgeon: Birder Robson, MD;  Location: [facility] ORS;  Service: Ophthalmology;  Laterality: Right;  [place] 01:34 AP% 19.3 CDE 18.36 Fluid pack lot # [id number]  . CATARACT EXTRACTION W/PHACO Left 10/06/2015   Procedure: CATARACT EXTRACTION PHACO AND INTRAOCULAR LENS PLACEMENT (IOC);  Surgeon: Birder Robson, MD;  Location: [facility] ORS;  Service: Ophthalmology;  Laterality: Left;  [place] 00:56 AP% 19.5 CDE 11.02 Fluid Pack # [id number]  . CHOLECYSTECTOMY      . COLONOSCOPY WITH PROPOFOL N/A 05/17/2016   Procedure: COLONOSCOPY WITH PROPOFOL;  Surgeon: Lollie Sails, MD;  Location: [facility] ENDOSCOPY;  Service: Endoscopy;  Laterality: N/A;  . EYE SURGERY Left    bilat laser  . HIP SURGERY Right    8 th grade pins  . NOSE SURGERY    . PERIPHERAL VASCULAR CATHETERIZATION Right 10/02/2014   Procedure: Lower Extremity Angiography;  Surgeon: Algernon Huxley, MD;  Location: [facility] CV LAB;  Service: Cardiovascular;  Laterality: Right;  . TOE AMPUTATION Left 2010   2cd  . TOE AMPUTATION Right 2009    Social History   Socioeconomic History  . Marital status: Divorced    Spouse name: Not on file  . Number of children: Not on file  . Years of education: Not on file  . Highest education level: Not on file  Occupational History    Employer: UNEMPLOYED  Social Needs  . Financial resource strain: Not on file  . Food insecurity:  Worry: Not on file    Inability: Not on file  . Transportation needs:    Medical: Not on file    Non-medical: Not on file  Tobacco Use  . Smoking status: Never Smoker  . Smokeless tobacco: Never Used  Substance and Sexual Activity  . Alcohol use: No    Alcohol/week: 0.0 oz  . Drug use: No  . Sexual activity: Not on file  Lifestyle  . Physical activity:    Days per week: Not on file    Minutes per session: Not on file  . Stress: Not on file  Relationships  . Social connections:    Talks on phone: Not on file    Gets together: Not on file    Attends religious service: Not on file    Active member of club or organization: Not on file    Attends meetings of clubs or organizations: Not on file    Relationship status: Not on file  . Intimate partner violence:    Fear of current or ex partner: Not on file    Emotionally abused: Not on file    Physically abused: Not on file    Forced sexual activity: Not on file  Other Topics Concern  . Not on file  Social History Narrative  . Not on file    Mobility: No  devices Work history: Not obtained   No Known Allergies  Family History  Problem Relation Age of Onset  . Hypertension Mother   . Diabetes Mother   . Stroke Father   . Hypertension Father   . Heart attack Father     Prior to Admission medications   Medication Sig Start Date End Date Taking? Authorizing Provider  aspirin EC 81 MG tablet Take 81 mg by mouth daily.   Yes [provider]  carvedilol (COREG) 12.5 MG tablet Take 1 tablet by mouth 2 (two) times daily. 02/09/17 02/09/18 Yes [provider]  enalapril (VASOTEC) 20 MG tablet Take 40 mg by mouth daily.    Yes [provider]  gabapentin (NEURONTIN) 300 MG capsule Take 300 mg by mouth 3 (three) times daily.   Yes [provider]  insulin detemir (LEVEMIR) 100 UNIT/ML injection Inject 89 Units into the skin 2 (two) times daily.    Yes [provider]  insulin lispro (HUMALOG) 100 UNIT/ML cartridge Inject 60 Units into the skin 3 (three) times daily with meals.   Yes [provider]  metFORMIN (GLUCOPHAGE) 1000 MG tablet Take 1,000 mg by mouth 2 (two) times daily with a meal.   Yes [provider]  Multiple Vitamins-Minerals (CENTRUM SILVER 50+MEN) TABS Take 1 tablet by mouth daily.   Yes [provider]  omega-3 acid ethyl esters (LOVAZA) 1 g capsule Take 1 g by mouth daily.   Yes [provider]  pantoprazole (PROTONIX) 40 MG tablet Take 40 mg by mouth daily.   Yes [provider]  spironolactone (ALDACTONE) 25 MG tablet Take 25 mg by mouth daily.   Yes [provider]  torsemide (DEMADEX) 20 MG tablet Take 2 tablets (40 mg total) by mouth 2 (two) times daily. 02/01/17 04/28/17 Yes Alisa Graff, FNP    Physical Exam: Vitals:   04/28/17 1511 04/28/17 1530 04/28/17 1549 04/28/17 1600  BP:   (!) 108/53 (!) 113/57  Pulse: 79 64 67 68  Resp: 20 13 17 15   Temp:      TempSrc:      SpO2: 91% 99%  100% 98%  Weight:      Height:           Constitutional: NAD, calm, comfortable Eyes: PERRL, lids and conjunctivae normal ENMT: Mucous membranes are moist. Posterior pharynx clear of any exudate or lesions. Poor dentition.  Neck: normal, supple, no masses, no thyromegaly Respiratory: clear to auscultation bilaterally, no wheezing, no crackles. Normal respiratory effort. No accessory muscle use.  Cardiovascular: Regular rate and rhythm, no murmurs / rubs / gallops. No extremity edema. 2+ pedal pulses. No carotid bruits.  Abdomen: no tenderness, no masses palpated. No hepatosplenomegaly. Bowel sounds positive.  Musculoskeletal: no clubbing / cyanosis. No joint deformity upper and lower extremities. Good ROM, no contractures. Normal muscle tone.  Skin: no rashes, lesions, ulcers. No induration Neurologic: CN 2-12 grossly intact. Sensation intact, DTR normal. Strength 5/5 x all 4 extremities.  Psychiatric: Normal judgment and insight. Alert and oriented x 3. Normal mood.    Labs on Admission: I have personally reviewed following labs and imaging studies  CBC: Recent Labs  Lab 04/28/17 1214  WBC 9.0  HGB 10.2*  HCT 31.6*  MCV 83.2  PLT 027   Basic Metabolic Panel: Recent Labs  Lab 04/28/17 1214  NA 138  K 5.1  CL 105  CO2 22  GLUCOSE 108*  BUN 69*  CREATININE 2.69*  CALCIUM 8.9   GFR: Estimated Creatinine Clearance: 51.9 mL/min (A) (by C-G formula based on SCr of 2.69 mg/dL (H)). Liver Function Tests: Recent Labs  Lab 04/28/17 1214  AST 17  ALT 18  ALKPHOS 76  BILITOT 0.6  PROT 6.7  ALBUMIN 3.6   No results for input(s): LIPASE, AMYLASE in the last 168 hours. No results for input(s): AMMONIA in the last 168 hours. Coagulation Profile: Recent Labs  Lab 04/28/17 1214  INR 1.07   Cardiac Enzymes: No results for input(s): CKTOTAL, CKMB, CKMBINDEX, TROPONINI in the last 168 hours. BNP (last 3 results) No results for input(s): PROBNP in the last 8760 hours. HbA1C: No results for input(s):  HGBA1C in the last 72 hours. CBG: Recent Labs  Lab 04/28/17 1220  GLUCAP 114*   Lipid Profile: No results for input(s): CHOL, HDL, LDLCALC, TRIG, CHOLHDL, LDLDIRECT in the last 72 hours. Thyroid Function Tests: No results for input(s): TSH, T4TOTAL, FREET4, T3FREE, THYROIDAB in the last 72 hours. Anemia Panel: No results for input(s): VITAMINB12, FOLATE, FERRITIN, TIBC, IRON, RETICCTPCT in the last 72 hours. Urine analysis:    Component Value Date/Time   COLORURINE STRAW (A) 04/28/2017 1214   APPEARANCEUR CLEAR 04/28/2017 1214   LABSPEC 1.006 04/28/2017 1214   PHURINE 5.0 04/28/2017 1214   GLUCOSEU NEGATIVE 04/28/2017 1214   HGBUR NEGATIVE 04/28/2017 1214   BILIRUBINUR NEGATIVE 04/28/2017 1214   KETONESUR NEGATIVE 04/28/2017 1214   PROTEINUR NEGATIVE 04/28/2017 1214   NITRITE NEGATIVE 04/28/2017 1214   LEUKOCYTESUR NEGATIVE 04/28/2017 1214   Sepsis Labs: @LABRCNTIP (procalcitonin:4,lacticidven:4) )No results found for this or any previous visit (from the past 240 hour(s)).   Radiological Exams on Admission: Dg Chest Port 1 View  Result Date: 04/28/2017 CLINICAL DATA:  Chest pain for 1 week with shortness of breath. History of congestive heart failure and chronic respiratory failure. EXAM: PORTABLE CHEST 1 VIEW COMPARISON:  CT 01/16/2017.  Radiographs 01/13/2017. FINDINGS: 1219 hours. Low lung volumes. The heart size and mediastinal contours are stable. The previously demonstrated bibasilar airspace opacities have partially cleared. There is vascular congestion and possible mild edema. No pneumothorax or significant pleural effusion. The bones appear unremarkable.  Telemetry leads overlie the chest. IMPRESSION: Compared with the studies of 3 months ago, the bibasilar airspace opacities have partially cleared but not completely. Possible mild pulmonary edema. Suggest two view exam when the patient is capable. Electronically Signed   By: Richardean Sale M.D.   On: 04/28/2017 12:45     EKG: (Independently reviewed) sinus rhythm with ventricular rate 68 bpm, QTC 412 ms, low voltage QRS waves with early/borderline nonspecific IVCD, no acute ischemic changes  Assessment/Plan Principal Problem:   Acute kidney injury  -Patient presents with dizziness, hypotension and near syncope with an associated acute kidney injury consistent with prerenal etiology likely from regular use of diuretics and further exacerbated by hypotension in a patient that utilizes beta-blockers and ACE inhibitor -Hold Aldactone, furosemide, carvedilol and Vasotec; we will also hold preadmission metformin -Gentle IV fluid hydration at 100/hr -Follow labs -Baseline renal function: 37/1.19 -Current renal function: 69/2.69  Active Problems:   Acute on chronic respiratory failure with hypoxia  -He reports he been prescribed 2 L of nasal cannula oxygen continuously (including with nocturnal CPAP) but does not -only wears with CPAP at HS -Chest x-ray appears stable for patient and hypoxemia likely related to nonadherence to recommended oxygen -BNP is normal and patient does not have peripheral edema making heart failure exacerbation much less likely -Given presentation with hypoxemia and low blood pressure with elevated d-dimer agree with ruling out PE and empirically treating with full dose IV heparin -Due to patient's obesity and poor chronic ventilatory effort he probably cannot undergo VQ scan (would likely have poor images) -will have to hydrate patient and wait until renal function returns to normal (baseline GFR greater than 60) and pursue CT angiogram of the chest -Continue supportive care with oxygen -I have asked case management to assist patient to see if he is eligible for oxygen concentrator which I suspect would make him more compliant with utilization of oxygen continuously after discharge -Lower extremity venous duplex to rule out DVT -Respiratory viral panel    Hypotension -Secondary to  preadmission medications and diuretics as well as dehydration -Holding medications as above -Current SBP stable and >100    Hypertension -Current blood pressure readings suboptimal as described above -home medications are being held    Diabetes mellitus type 2, insulin dependent  -High-dose Levemir at home-continue -Adding metformin as above -Follow CBGs and provide SSI -HgbA1c    Chronic diastolic heart failure, NYHA class 1  -Appears compensated -Recent adjustments in diuretic and beta-blockers in December as described above -Appreciate cardiology assistance -Echocardiogram completed in the ER today: Image quality is suboptimal with a technically difficult study secondary to body habitus.  Mild LVH, EF 60-65% grossly normal wall motion -Weight in December 358 lbs -Current weight 373 lbs -[place]ak weight was 400 lbs December 2018 -Echocardiogram January 2019: Preserved LV function EF 55-65%, mild concentric hypertrophy, study listed as technically difficult, trivial mitral regurgitation with trivial tricuspid regurgitation -Daily weights, strict I's/O -Beta-blocker, ACE inhibitor, and diuretics on hold as above    PVD (peripheral vascular disease)  -Continue Neurontin    OSA on CPAP -Continue nocturnal CPAP with 2 L oxygen bleeding    Morbid obesity with BMI of 45.0-49.9, adult  -Patient continuing to work on weight reduction strategies    Chest pain with moderate risk for cardiac etiology -Patient did have mild chest pressure likely related to hypoxemia and hypotension but for completeness of evaluation will cycle troponin-EKG unremarkable -Currently chest pain-free -Appreciate cardiology assistance -Heparin as above -Continue  home aspirin omega-3 fatty acids -Beta-blocker on hold as above   **Additional lab, imaging and/or diagnostic evaluation at discretion of supervising physician  DVT prophylaxis: Heparin infusion Code Status: Full Family Communication: Mother and  sister at bedside Disposition Plan: [place] called: Cardiology/[facility] on call    Joakim Huesman L. ANP-BC [facility] Pager [phone]   If 7PM-7AM, please contact night-coverage [email or web address] Password [facility]  04/28/2017, 4:43 PM

## 2017-04-28 NOTE — Progress Notes (Signed)
  Echocardiogram 2D Echocardiogram with definity has been performed.  Darlina Sicilian M 04/28/2017, 2:51 PM

## 2017-04-28 NOTE — ED Notes (Signed)
ECHO at the bedside.

## 2017-04-28 NOTE — Progress Notes (Signed)
New Admission Note:  Arrival Method: Bed  Mental Orientation: Alert and oriented x 4 Telemetry: Box 10 Assessment: Completed Skin: warm and dry Tattoos IV: NSL 's Pain: Denies  Tubes: N/A Safety Measures: Safety Fall Prevention Plan initiated.  Admission: Completed 5 M  Orientation: Patient has been orientated to the room, unit and the staff. Family: None  Orders have been reviewed and implemented. Will continue to monitor the patient. Call light has been placed within reach and bed alarm has been activated.   Sima Matas BSN, RN  Phone Number: [phone]

## 2017-04-28 NOTE — ED Provider Notes (Signed)
[facility] EMERGENCY DEPARTMENT Provider Note   CSN: [id number] Arrival date & time: 04/28/17  1155     History   Chief Complaint Chief Complaint  Patient presents with  . Near Syncope    HPI Raymond [name] is a 52 y.o. male.  HPI Patient has known history of diastolic congestive heart failure with last EF 55 to 65% January 2019.  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  He had been seated and started to feel weak and lightheaded.  His wife believes he was becoming hypoglycemic.  She gave him something to eat and blood sugar was checked and at 165.  On EMS arrival patient's blood pressure was 82/41.  Room air oxygen saturation was 86%.  She was given aspirin and 250 cc normal saline prior to arrival.  Patient reports he continues to feel generally very weak.  No focal weakness numbness or tingling.  He reports he feels a pressure all around his chest and shortness of breath.. Past Medical History:  Diagnosis Date  . Arthritis    hands  . Cellulitis   . CHF (congestive heart failure) ([place])   . Chronic respiratory failure with hypoxia (HCC)    2 L [place] continuously  . Diabetes ([place])   . Edema    FEET/LEGS  . GERD (gastroesophageal reflux disease)   . Hepatic steatosis   . HOH (hard of hearing)   . Hypertension   . Morbid obesity ([place])   . Neuropathy   . Orthopnea   . OSA on CPAP    TRILOGY VENTILATOR  . Oxygen deficiency    2L  HS  . PVD (peripheral vascular disease) ([place])   . Shortness of breath dyspnea   . Systolic heart failure (HCC)    Preserved EF 50-55%    Patient Active Problem List   Diagnosis Date Noted  . Lymphedema 02/11/2017  . HTN (hypertension) 02/01/2017  . Diabetes ([place]) 02/01/2017  . Pneumonia 01/13/2017  . Cellulitis of right foot 09/30/2014  . COPD, moderate ([place]) 07/07/2014  . Chronic diastolic heart failure ([place]) 05/06/2014  . OSA treated with Trilogy machine 04/28/2014  . Hypoxemia requiring  supplemental oxygen 04/28/2014  . Morbid obesity ([place]) 04/28/2014    Past Surgical History:  Procedure Laterality Date  . AMPUTATION Right 10/02/2014   Procedure: AMPUTATION RAY;  Surgeon: Albertine Patricia, DPM;  Location: [facility] ORS;  Service: Podiatry;  Laterality: Right;  . CATARACT EXTRACTION W/PHACO Right 09/10/2015   Procedure: CATARACT EXTRACTION PHACO AND INTRAOCULAR LENS PLACEMENT (IOC);  Surgeon: Birder Robson, MD;  Location: [facility] ORS;  Service: Ophthalmology;  Laterality: Right;  [place] 01:34 AP% 19.3 CDE 18.36 Fluid pack lot # [id number]  . CATARACT EXTRACTION W/PHACO Left 10/06/2015   Procedure: CATARACT EXTRACTION PHACO AND INTRAOCULAR LENS PLACEMENT (IOC);  Surgeon: Birder Robson, MD;  Location: [facility] ORS;  Service: Ophthalmology;  Laterality: Left;  [place] 00:56 AP% 19.5 CDE 11.02 Fluid Pack # [id number]  . CHOLECYSTECTOMY    . COLONOSCOPY WITH PROPOFOL N/A 05/17/2016   Procedure: COLONOSCOPY WITH PROPOFOL;  Surgeon: Lollie Sails, MD;  Location: [facility] ENDOSCOPY;  Service: Endoscopy;  Laterality: N/A;  . EYE SURGERY Left    bilat laser  . HIP SURGERY Right    8 th grade pins  . NOSE SURGERY    . PERIPHERAL VASCULAR CATHETERIZATION Right 10/02/2014   Procedure: Lower Extremity Angiography;  Surgeon: Algernon Huxley, MD;  Location: [facility] LAB;  Service: Cardiovascular;  Laterality: Right;  . TOE AMPUTATION Left 2010   2cd  . TOE AMPUTATION Right 2009        Home Medications    Prior to Admission medications   Medication Sig Start Date End Date Taking? Authorizing Provider  aspirin EC 81 MG tablet Take 81 mg by mouth daily.    [provider]  carvedilol (COREG) 12.5 MG tablet Take 1 tablet by mouth 2 (two) times daily. 02/09/17 02/09/18  [provider]  enalapril (VASOTEC) 20 MG tablet Take 40 mg by mouth daily.     [provider]  gabapentin (NEURONTIN) 300 MG capsule Take 300 mg by mouth 3 (three) times daily.    [provider]    insulin detemir (LEVEMIR) 100 UNIT/ML injection Inject 89 Units into the skin 2 (two) times daily.     [provider]  insulin lispro (HUMALOG) 100 UNIT/ML cartridge Inject 60 Units into the skin 3 (three) times daily with meals.    [provider]  metFORMIN (GLUCOPHAGE) 1000 MG tablet Take 1,000 mg by mouth 2 (two) times daily with a meal.    [provider]  pantoprazole (PROTONIX) 40 MG tablet Take 40 mg by mouth daily.    [provider]  spironolactone (ALDACTONE) 25 MG tablet Take 25 mg by mouth daily.    [provider]  torsemide (DEMADEX) 20 MG tablet Take 2 tablets (40 mg total) by mouth 2 (two) times daily. 02/01/17 03/03/17  Alisa Graff, FNP    Family History Family History  Problem Relation Age of Onset  . Hypertension Mother   . Diabetes Mother   . Stroke Father   . Hypertension Father   . Heart attack Father     Social History Social History   Tobacco Use  . Smoking status: Never Smoker  . Smokeless tobacco: Never Used  Substance Use Topics  . Alcohol use: No    Alcohol/week: 0.0 oz  . Drug use: No     Allergies   Patient has no known allergies.   Review of Systems Review of Systems 10 Systems reviewed and are negative for acute change except as noted in the HPI.   Physical Exam Updated Vital Signs BP (!) 76/51   Pulse 67   Temp (!) 97.4 F (36.3 C) (Oral)   Resp (!) 21   Ht 6\' 1"  (1.854 m)   Wt (!) 169.2 kg (373 lb)   SpO2 99%   BMI 49.21 kg/m   Physical Exam  Constitutional:  Patient is alert and mental status is clear.  He does not have respiratory distress at rest.  Patient body habitus is very large with associated obesity.  Mildly pale appearance.  HENT:  Head: Normocephalic and atraumatic.  Nose: Nose normal.  Mouth/Throat: Oropharynx is clear and moist.  Eyes: Pupils are equal, round, and reactive to light. EOM are normal.  Neck: Neck supple.  Cardiovascular:  Rate is normal.  Heart  sounds are distant.  No appreciable rub murmur gallop.  Nurses have been having difficulty measuring blood pressure.  For my physical examination patient has a palpable radial pulse with easily perceptible pulsation.  Not thready.  Pulmonary/Chest:  Patient has large body habitus.  No apparent wheeze rhonchi rales.  Abdominal: Soft. He exhibits no distension. There is no tenderness. There is no guarding.  Musculoskeletal:  Bilateral lower extremity edema 2+.  Calves are soft.  Nontender.  Neurological:  Patient is alert and appropriate but fatigued in  appearance.  He continues to report feeling tired.  Speech is clear.  Speech is not slurred.  Movements are coordinated purposeful.  No focal motor deficits.  Skin: Skin is warm and dry. There is pallor.  Psychiatric: He has a normal mood and affect.     ED Treatments / Results  Labs (all labs ordered are listed, but only abnormal results are displayed) Labs Reviewed  BASIC METABOLIC PANEL - Abnormal; Notable for the following components:      Result Value   Glucose, Bld 108 (*)    BUN 69 (*)    Creatinine, Ser 2.69 (*)    GFR calc non Af Amer 25 (*)    GFR calc Af Amer 29 (*)    All other components within normal limits  CBC - Abnormal; Notable for the following components:   RBC 3.80 (*)    Hemoglobin 10.2 (*)    HCT 31.6 (*)    All other components within normal limits  URINALYSIS, ROUTINE W REFLEX MICROSCOPIC - Abnormal; Notable for the following components:   Color, Urine STRAW (*)    All other components within normal limits  CBG MONITORING, ED - Abnormal; Notable for the following components:   Glucose-Capillary 114 (*)    All other components within normal limits  I-STAT CG4 LACTIC ACID, ED - Abnormal; Notable for the following components:   Lactic Acid, Venous 2.23 (*)    All other components within normal limits  RAPID URINE DRUG SCREEN, HOSP PERFORMED  HEPATIC FUNCTION PANEL  BRAIN NATRIURETIC PEPTIDE  D-DIMER,  QUANTITATIVE (NOT AT [facility])  PROTIME-INR  I-STAT TROPONIN, ED  I-STAT CG4 LACTIC ACID, ED    EKG Sinus rhythm 68 QRS 105 QTC 412 Occasional PVC.  Borderline repolarization abnormality. No STEMI.  Isolated inferior Q waves III. Compared to previous EKG no interval change.  Radiology Dg Chest Port 1 View  Result Date: 04/28/2017 CLINICAL DATA:  Chest pain for 1 week with shortness of breath. History of congestive heart failure and chronic respiratory failure. EXAM: PORTABLE CHEST 1 VIEW COMPARISON:  CT 01/16/2017.  Radiographs 01/13/2017. FINDINGS: 1219 hours. Low lung volumes. The heart size and mediastinal contours are stable. The previously demonstrated bibasilar airspace opacities have partially cleared. There is vascular congestion and possible mild edema. No pneumothorax or significant pleural effusion. The bones appear unremarkable. Telemetry leads overlie the chest. IMPRESSION: Compared with the studies of 3 months ago, the bibasilar airspace opacities have partially cleared but not completely. Possible mild pulmonary edema. Suggest two view exam when the patient is capable. Electronically Signed   By: Richardean Sale M.D.   On: 04/28/2017 12:45    Procedures Procedures (including critical care time) CRITICAL CARE Performed by: Si Gaul   Total critical care time: 45 minutes  Critical care time was exclusive of separately billable procedures and treating other patients.  Critical care was necessary to treat or prevent imminent or life-threatening deterioration.  Critical care was time spent personally by me on the following activities: development of treatment plan with patient and/or surrogate as well as nursing, discussions with consultants, evaluation of patient's response to treatment, examination of patient, obtaining history from patient or surrogate, ordering and performing treatments and interventions, ordering and review of laboratory studies, ordering and review  of radiographic studies, pulse oximetry and re-evaluation of patient's condition. Medications Ordered in ED Medications  sodium chloride 0.9 % bolus 1,000 mL (1,000 mLs Intravenous New Bag/Given 04/28/17 1406)  sodium chloride 0.9 % bolus 500 mL (  0 mLs Intravenous Stopped 04/28/17 1341)     Initial Impression / Assessment and Plan / ED Course  I have reviewed the triage vital signs and the nursing notes.  Pertinent labs & imaging results that were available during my care of the patient were reviewed by me and considered in my medical decision making (see chart for details).  Clinical Course as of Apr 28 1416  Fri Apr 28, 2017  1411 Patient recheck.  He denies he has chest pain at this time.  He continues to complain of feeling extremely weak.  Repeat EKG is been obtained and no acute interval ST elevations or significant changes from previous.  Patient continues to have hypotension of 18H over 40 systolic.  Will continue fluid resuscitation.  Echocardiogram is ordered and cardiology consultation has been placed.   [MP]    Clinical Course User Index [MP] Charlesetta Shanks, MD    Consult: (14: 05) cardiology consulted.  See patient in the emergency department.  Final Clinical Impressions(s) / ED Diagnoses   Final diagnoses:  Near syncope  Hypotension, unspecified hypotension type  Precordial pain  Shortness of breath  AKI (acute kidney injury) ([place])   Patient presents acute onset of generalized weakness and shortness of breath with diffuse chest pressure.  Patient was hypotensive.  Initial EKG did not show STEMI or acute ischemic pattern.  Review of systems did not suggest acute infectious etiology.  Patient supported with fluid resuscitation.  AK I present.  Cardiology consulted and stat echo obtained.  Differential diagnosis includes pulmonary embolus.  Heparin started empirically.  Plan for admission.  ED Discharge Orders    None       Charlesetta Shanks, MD 05/05/17 [phone]

## 2017-04-28 NOTE — Progress Notes (Signed)
ANTICOAGULATION CONSULT NOTE - Initial Consult  Pharmacy Consult for heparin Indication: suspected PE  No Known Allergies  Patient Measurements: Height: 6\' 1"  (185.4 cm) Weight: (!) 373 lb (169.2 kg) IBW/kg (Calculated) : 79.9 Heparin Dosing Weight: 120 kg  Vital Signs: Temp: 97.4 F (36.3 C) (03/29 1201) Temp Source: Oral (03/29 1201) BP: 113/57 (03/29 1600) Pulse Rate: 68 (03/29 1600)  Labs: Recent Labs    04/28/17 1214  HGB 10.2*  HCT 31.6*  PLT 333  LABPROT 13.9  INR 1.07  CREATININE 2.69*    Estimated Creatinine Clearance: 51.9 mL/min (A) (by C-G formula based on SCr of 2.69 mg/dL (H)).  Assessment: CC/HPI: 52 yo m presenting with acute dizziness and SOB  PMH: CHF, HTN  Anticoag: NONE PTA - IV hep for suspected acute PE  Renal: SCr 2.69  Heme/Onc: H&H 10.2/31.6, Plt 333  Goal of Therapy:  Heparin level 0.3-0.7 units/ml Monitor platelets by anticoagulation protocol: Yes   Plan:  Heparin bolus 7500 units Hep gtt 2000 units/hr Initial lvl 2200 Daily hep lvl cbc F/u confirmation of PE vs long term AC plans  Levester Fresh, PharmD, BCPS, BCCCP Clinical Pharmacist Clinical phone for 04/28/2017 from 1430 - 2300: [phone] If after 2300, please call main pharmacy at: [phone] 04/28/2017 4:25 PM

## 2017-04-28 NOTE — Care Management (Addendum)
ED CM reviewed patient's record patient noted to be on 2L  Continuous oxygen supplied by [facility]. Unit CM will continue to follow for transitional care planning

## 2017-04-28 NOTE — ED Triage Notes (Signed)
Per EMS: pt was walking around outside today and started to become dizzy and SOB. Pt has Hx of CHF. Pt SPO2 was 86% on RA and put on 2L O2 [place]. Pt given 324 ASP. 250 cc NS due to BP of 82/41. CBG is 92. NSR. A&Ox4.

## 2017-04-28 NOTE — ED Notes (Signed)
Main Lab called to add on labs

## 2017-04-28 NOTE — Consult Note (Addendum)
Cardiology Consultation:   [name]; [id number]; 11-18-1965   Admit date: 04/28/2017 Date of Consult: 04/28/2017  Primary Care Provider: Center, [facility] Primary Cardiologist: Dr Nehemiah Massed (02/10/2017) Primary Electrophysiologist:  n/a   Patient Profile:   Raymond [name] is a 52 y.o. male with a hx of LVH, DM, HTN, OSA on CPAP, D-CHF, who is being seen today for the evaluation of CHF/CP at the request of Dr Johnney Killian.  History of Present Illness:   Mr. Valent was in his usual state of health until today.  He was walking around [employer] today which is much more exertion than usual for him.  After he had been walking for a while, he got weak and lightheaded.  He thought his blood sugar was dropping so he sat down and rested and had a little something to eat.  After he rested a while, he felt better.  He was not having any chest pain or shortness of breath.  When he stood up, the weakness was worse and he was extremely lightheaded.  At that time, he developed chest pain.  There was some shortness of breath associated with it as well.  The chest pain was a 7/10, pressure.  He sat back down because he was so lightheaded he was concerned he would fall.  He was transported by EMS.  He reports being given aspirin, the chest pain seemed to improve.  The chest pain is currently a 5/10, pressure.  Upon EMS arrival, his O2 saturation was 86% on room air.  He was started on 2 L of oxygen.  He was hypotensive with a systolic blood pressure of 82 and was given a 500 cc fluid bolus.  In the ER, his systolic blood pressures in the 90s.  His O2 saturation is 95% at rest on O2.  He is not currently having any shortness of breath although he is still having the chest pressure.  He is never had chest pressure like this before.  He does not weigh himself routinely but thinks his weight generally runs between 364 and 375 pounds.  He does not  know if he has gained any weight recently.  He has had lower extremity swelling for years, ever since he was diagnosed as a diabetic.  He does not think the lower extremity edema has changed recently.  His last A1c was approximately 7%.  He eats out sometimes, but cooks at home frequently.  He does not add salt to foods and feels that he does a pretty good job cooking a low-sodium diet.  He admits that he does not stick tightly to a diabetic diet.    He uses a trilogy machine every night.  He has chronic orthopnea, no recent change.  He denies PND.  He has chronic severe dyspnea on exertion.  In the ER, his lactic acid was elevated at 2.23 and his BUN and creatinine were also well above baseline at 69/2.69.  He has gotten another 1.5 L of IV fluids.  His lactic acid level has improved to 1.8.  Baseline BUN and creatinine are 37/1.19 in December 2018.    Past Medical History:  Diagnosis Date  . Arthritis    hands  . Cellulitis   . CHF (congestive heart failure) ([place])   . Chronic diastolic heart failure ([place])   . Chronic respiratory failure with hypoxia (HCC)    2 L [place] continuously  . Diabetes mellitus type 2, insulin dependent ([place])   .  Edema    FEET/LEGS  . GERD (gastroesophageal reflux disease)   . Hepatic steatosis   . HOH (hard of hearing)   . Hypertension   . Morbid obesity ([place])   . Neuropathy   . Orthopnea   . OSA on CPAP    TRILOGY VENTILATOR  . Oxygen deficiency    2L  HS  . PVD (peripheral vascular disease) ([place])   . Shortness of breath dyspnea   . Systolic heart failure (HCC)    Preserved EF 50-55%    Past Surgical History:  Procedure Laterality Date  . AMPUTATION Right 10/02/2014   Procedure: AMPUTATION RAY;  Surgeon: Albertine Patricia, DPM;  Location: [facility] ORS;  Service: Podiatry;  Laterality: Right;  . CATARACT EXTRACTION W/PHACO Right 09/10/2015   Procedure: CATARACT EXTRACTION PHACO AND INTRAOCULAR LENS PLACEMENT (IOC);  Surgeon: Birder Robson, MD;  Location:  [facility] ORS;  Service: Ophthalmology;  Laterality: Right;  [place] 01:34 AP% 19.3 CDE 18.36 Fluid pack lot # [id number]  . CATARACT EXTRACTION W/PHACO Left 10/06/2015   Procedure: CATARACT EXTRACTION PHACO AND INTRAOCULAR LENS PLACEMENT (IOC);  Surgeon: Birder Robson, MD;  Location: [facility] ORS;  Service: Ophthalmology;  Laterality: Left;  [place] 00:56 AP% 19.5 CDE 11.02 Fluid Pack # [id number]  . CHOLECYSTECTOMY    . COLONOSCOPY WITH PROPOFOL N/A 05/17/2016   Procedure: COLONOSCOPY WITH PROPOFOL;  Surgeon: Lollie Sails, MD;  Location: [facility] ENDOSCOPY;  Service: Endoscopy;  Laterality: N/A;  . EYE SURGERY Left    bilat laser  . HIP SURGERY Right    8 th grade pins  . NOSE SURGERY    . PERIPHERAL VASCULAR CATHETERIZATION Right 10/02/2014   Procedure: Lower Extremity Angiography;  Surgeon: Algernon Huxley, MD;  Location: [place] CV LAB;  Service: Cardiovascular;  Laterality: Right;  . TOE AMPUTATION Left 2010   2cd  . TOE AMPUTATION Right 2009     Prior to Admission medications   Medication Sig Start Date End Date Taking? Authorizing Provider  aspirin EC 81 MG tablet Take 81 mg by mouth daily.    [provider]  carvedilol (COREG) 12.5 MG tablet Take 1 tablet by mouth 2 (two) times daily. 02/09/17 02/09/18  [provider]  enalapril (VASOTEC) 20 MG tablet Take 40 mg by mouth daily.     [provider]  gabapentin (NEURONTIN) 300 MG capsule Take 300 mg by mouth 3 (three) times daily.    [provider]  insulin detemir (LEVEMIR) 100 UNIT/ML injection Inject 89 Units into the skin 2 (two) times daily.     [provider]  insulin lispro (HUMALOG) 100 UNIT/ML cartridge Inject 60 Units into the skin 3 (three) times daily with meals.    [provider]  metFORMIN (GLUCOPHAGE) 1000 MG tablet Take 1,000 mg by mouth 2 (two) times daily with a meal.    [provider]  pantoprazole (PROTONIX) 40 MG tablet Take 40 mg by mouth daily.    [provider]  spironolactone (ALDACTONE) 25 MG tablet Take 25 mg by mouth daily.    [provider]  torsemide (DEMADEX) 20 MG tablet Take 2 tablets (40 mg total) by mouth 2 (two) times daily. 02/01/17 03/03/17  Darylene Price A, FNP    . sodium chloride    . heparin    PRN Meds: acetaminophen **OR** acetaminophen, ondansetron **OR** ondansetron (ZOFRAN) IV, perflutren lipid microspheres (DEFINITY) IV suspension  Allergies:   No Known Allergies  Social History:   Social History  Socioeconomic History  . Marital status: Divorced    Spouse name: Not on file  . Number of children: Not on file  . Years of education: Not on file  . Highest education level: Not on file  Occupational History    Employer: UNEMPLOYED  Social Needs  . Financial resource strain: Not on file  . Food insecurity:    Worry: Not on file    Inability: Not on file  . Transportation needs:    Medical: Not on file    Non-medical: Not on file  Tobacco Use  . Smoking status: Never Smoker  . Smokeless tobacco: Never Used  Substance and Sexual Activity  . Alcohol use: No    Alcohol/week: 0.0 oz  . Drug use: No  . Sexual activity: Not on file  Lifestyle  . Physical activity:    Days per week: Not on file    Minutes per session: Not on file  . Stress: Not on file  Relationships  . Social connections:    Talks on phone: Not on file    Gets together: Not on file    Attends religious service: Not on file    Active member of club or organization: Not on file    Attends meetings of clubs or organizations: Not on file    Relationship status: Not on file  . Intimate partner violence:    Fear of current or ex partner: Not on file    Emotionally abused: Not on file    Physically abused: Not on file    Forced sexual activity: Not on file  Other Topics Concern  . Not on file  Social History Narrative  . Not on file    Family History:   Family History  Problem Relation Age of Onset  . Hypertension  Mother   . Diabetes Mother   . Stroke Father   . Hypertension Father   . Heart attack Father    Family Status:  Family Status  Relation Name Status  . Mother  Alive  . Father  Deceased    ROS:  Please see the history of present illness.  All other ROS reviewed and negative.     Physical Exam/Data:   Vitals:   04/28/17 1511 04/28/17 1530 04/28/17 1549 04/28/17 1600  BP:   (!) 108/53 (!) 113/57  Pulse: 79 64 67 68  Resp: 20 13 17 15   Temp:      TempSrc:      SpO2: 91% 99% 100% 98%  Weight:      Height:        Intake/Output Summary (Last 24 hours) at 04/28/2017 1642 Last data filed at 04/28/2017 1545 Gross per 24 hour  Intake 1500 ml  Output -  Net 1500 ml   Filed Weights   04/28/17 1201  Weight: (!) 373 lb (169.2 kg)   Body mass index is 49.21 kg/m.  General:  Well nourished, well developed, in no acute distress HEENT: normal Lymph: no adenopathy Neck: no JVD seen but difficult to assess secondary to body habitus Endocrine:  No thryomegaly Vascular: No carotid bruits; 4/4 extremity pulses 2+, without bruits  Cardiac:  normal S1, S2; RRR; no murmur,  Lungs: Decreased breath sounds bases bilaterally, no wheezing, rhonchi or rales  Abd: soft, nontender, no hepatomegaly  Ext: 1-2+ lower extremity edema, difficult to assess because compression socks are in place but pulled down, causing constriction Musculoskeletal:  No deformities, BUE and BLE strength normal and equal Skin: warm and  dry  Neuro:  CNs 2-12 intact, no focal abnormalities noted Psych:  Normal affect   EKG:  The EKG was personally reviewed and demonstrates: 3/29, sinus rhythm, heart rate 78, early re-pole pattern similar to 01/13/2017 ECG  Telemetry:  Telemetry was personally reviewed and demonstrates: Sinus rhythm  Relevant CV Studies:  ECHO: 04/28/2017 - Procedure narrative: Transthoracic echocardiography. Image   quality was suboptimal. The study was technically difficult, as a   result of  body habitus. Intravenous contrast (Definity) was   administered. - Left ventricle: The cavity size was normal. Wall thickness was   increased in a pattern of mild LVH. Systolic function was normal.   The estimated ejection fraction was in the range of 60% to 65%.   Wall motion was normal; there were no regional wall motion   abnormalities. Impressions: - Technically difficult study. Very limited echo windows. LVEF   60-65% by Definity contrast, mild LVH and grossly normal wall   motion - cannot further evalute cardiac structures.  CATH: n/a  Laboratory Data:  Chemistry Recent Labs  Lab 04/28/17 1214  NA 138  K 5.1  CL 105  CO2 22  GLUCOSE 108*  BUN 69*  CREATININE 2.69*  CALCIUM 8.9  GFRNONAA 25*  GFRAA 29*  ANIONGAP 11    Lab Results  Component Value Date   ALT 18 04/28/2017   AST 17 04/28/2017   ALKPHOS 76 04/28/2017   BILITOT 0.6 04/28/2017   Hematology Recent Labs  Lab 04/28/17 1214  WBC 9.0  RBC 3.80*  HGB 10.2*  HCT 31.6*  MCV 83.2  MCH 26.8  MCHC 32.3  RDW 14.2  PLT 333   Cardiac Enzymes  Recent Labs  Lab 04/28/17 1234  TROPIPOC 0.00    BNP Recent Labs  Lab 04/28/17 1229  BNP 14.2    DDimer  Recent Labs  Lab 04/28/17 1214  DDIMER 3.05*    Radiology/Studies:  Dg Chest Port 1 View  Result Date: 04/28/2017 CLINICAL DATA:  Chest pain for 1 week with shortness of breath. History of congestive heart failure and chronic respiratory failure. EXAM: PORTABLE CHEST 1 VIEW COMPARISON:  CT 01/16/2017.  Radiographs 01/13/2017. FINDINGS: 1219 hours. Low lung volumes. The heart size and mediastinal contours are stable. The previously demonstrated bibasilar airspace opacities have partially cleared. There is vascular congestion and possible mild edema. No pneumothorax or significant pleural effusion. The bones appear unremarkable. Telemetry leads overlie the chest. IMPRESSION: Compared with the studies of 3 months ago, the bibasilar airspace  opacities have partially cleared but not completely. Possible mild pulmonary edema. Suggest two view exam when the patient is capable. Electronically Signed   By: Richardean Sale M.D.   On: 04/28/2017 12:45    Assessment and Plan:   1. Chest pain, mod risk CAD - initial ez neg MI and ECG not acute - continue to cycle ez, add heparin - continue ASA - no nitrates 2nd low BP - with elevated d-dimer, will order venous Dopplers r/o DVT - do not think patient is breathing well enough to fully participate in a VQ scan and the images would likely not be helpful. - EF normal on echo w/ no WMA - MD advise if cath indicated, cannot do now due to elevated BUN/Cr, but his baseline is lower.  2. Chronic diastolic heart failure, NYHA class 1 ([place]) - has gotten IVF for hypotension - BP is better, will have to watch for volume overload. - daily wts  Otherwise, per IM Principal  Problem:  Acute kidney injury ([place]) Active Problems:   Acute on chronic respiratory failure with hypoxia (HCC)   Hypotension   PVD (peripheral vascular disease) (HCC)   Hypertension   Diabetes mellitus type 2, insulin dependent (HCC)   OSA on CPAP   For questions or updates, please contact [facility] Please consult [email or web address] for contact info under Cardiology/STEMI.   Jonetta Speak, PA-C  04/28/2017 4:42 PM  Attending Note:   The patient was seen and examined.  Agree with assessment and plan as noted above.  Changes made to the above note as needed.  Patient seen and independently examined with Rosaria Ferries, PA .   We discussed all aspects of the encounter. I agree with the assessment and plan as stated above.  1  Chest pain :   Doubt this is due to ACS ,  No ECG changes.   Trop is negative so far.  ? Pulmonary embolus - no further CP ,  Breathing has improved , D-dimer is elevated.   Would try to get a V/Q scan.   May be difficult with his large body habitus. I personally reviewed the echo  images.   The LV function is normal   ? Aortic dissection :    No ongoing CP .   Character of pain is not suggestive of dissection  Echo shows normal LV systolic function Could not see the RV well .  In the contrast views, the RV seems to be a bit enlarged and may be hypocontractile.   2.  Acute hypotension the etiology of his sudden hypotension is unclear.  His labs are consistent with volume depletion although he states that he eats and drinks regularly.  He did not drink that much this morning and that could have contributed to this episode of hypotension.  His pressure seems to be coming up after some IV fluids.  3.  Acute kidney injury: Patient has BUN of 69 and a creatinine of 2.69.   Most recent labs drawn at [facility] clinic show a BUN of 65 and a creatinine of 2.0.  This might just be progression of his renal disease due to his diabetes.  4.  Morbid obesity:   Needs to work on his diet and exercise . Will need to follow up with his primary MD in [place]    I have spent a total of 40 minutes with patient reviewing hospital  notes , telemetry, EKGs, labs and examining patient as well as establishing an assessment and plan that was discussed with the patient. > 50% of time was spent in direct patient care.   Thayer Headings, Brooke Bonito., MD, [facility] 04/28/2017, 5:12 PM [address]  [place] Pager [phone]

## 2017-04-28 NOTE — ED Notes (Signed)
Ordered dinner tray.  

## 2017-04-28 NOTE — Progress Notes (Signed)
RT at bedside to assist pt with CPAP.  Pt stated he doesn't go to bed until 3 or 4 in the morning.  RT notified RN and pt both to let RT know when pt is ready to go wear the CPAP.

## 2017-04-28 NOTE — ED Notes (Signed)
Pt placed on 4L O2 [place] and SPO2 94%

## 2017-04-29 ENCOUNTER — Inpatient Hospital Stay (HOSPITAL_COMMUNITY): Payer: Medicare Other

## 2017-04-29 DIAGNOSIS — R609 Edema, unspecified: Secondary | ICD-10-CM

## 2017-04-29 DIAGNOSIS — R079 Chest pain, unspecified: Secondary | ICD-10-CM

## 2017-04-29 DIAGNOSIS — N179 Acute kidney failure, unspecified: Secondary | ICD-10-CM | POA: Diagnosis not present

## 2017-04-29 DIAGNOSIS — J9621 Acute and chronic respiratory failure with hypoxia: Secondary | ICD-10-CM

## 2017-04-29 DIAGNOSIS — R0602 Shortness of breath: Secondary | ICD-10-CM | POA: Diagnosis not present

## 2017-04-29 LAB — CBC
HCT: 31.4 % — ABNORMAL LOW (ref 39.0–52.0)
Hemoglobin: 9.9 g/dL — ABNORMAL LOW (ref 13.0–17.0)
MCH: 26.8 pg (ref 26.0–34.0)
MCHC: 31.5 g/dL (ref 30.0–36.0)
MCV: 85.1 fL (ref 78.0–100.0)
Platelets: 309 10*3/uL (ref 150–400)
RBC: 3.69 MIL/uL — ABNORMAL LOW (ref 4.22–5.81)
RDW: 14.5 % (ref 11.5–15.5)
WBC: 8.8 10*3/uL (ref 4.0–10.5)

## 2017-04-29 LAB — GLUCOSE, CAPILLARY
Glucose-Capillary: 175 mg/dL — ABNORMAL HIGH (ref 65–99)
Glucose-Capillary: 191 mg/dL — ABNORMAL HIGH (ref 65–99)
Glucose-Capillary: 196 mg/dL — ABNORMAL HIGH (ref 65–99)
Glucose-Capillary: 189 mg/dL — ABNORMAL HIGH (ref 65–99)

## 2017-04-29 LAB — BASIC METABOLIC PANEL
Anion gap: 10 (ref 5–15)
BUN: 66 mg/dL — ABNORMAL HIGH (ref 6–20)
CO2: 22 mmol/L (ref 22–32)
Calcium: 8.6 mg/dL — ABNORMAL LOW (ref 8.9–10.3)
Chloride: 106 mmol/L (ref 101–111)
Creatinine, Ser: 2.17 mg/dL — ABNORMAL HIGH (ref 0.61–1.24)
GFR calc Af Amer: 38 mL/min — ABNORMAL LOW (ref >60)
GFR calc non Af Amer: 33 mL/min — ABNORMAL LOW (ref >60)
Glucose, Bld: 223 mg/dL — ABNORMAL HIGH (ref 65–99)
Potassium: 6.6 mmol/L (ref 3.5–5.1)
Sodium: 138 mmol/L (ref 135–145)

## 2017-04-29 LAB — HEPARIN LEVEL (UNFRACTIONATED): Heparin Unfractionated: 0.29 IU/mL — ABNORMAL LOW (ref 0.30–0.70)

## 2017-04-29 LAB — POTASSIUM: Potassium: 5.4 mmol/L — ABNORMAL HIGH (ref 3.5–5.1)

## 2017-04-29 LAB — TROPONIN I: Troponin I: <0.03 ng/mL (ref <0.03)

## 2017-04-29 MED ORDER — SODIUM POLYSTYRENE SULFONATE 15 GM/60ML PO SUSP
30.0000 g | Freq: Once | ORAL | Status: AC
  Administered 2017-04-29: 30 g via ORAL
  Filled 2017-04-29: qty 120

## 2017-04-29 MED ORDER — TECHNETIUM TO 99M ALBUMIN AGGREGATED
4.3500 | Freq: Once | INTRAVENOUS | Status: AC | PRN
Start: 2017-04-29 — End: 2017-04-29
  Administered 2017-04-29: 4.35 via INTRAVENOUS

## 2017-04-29 MED ORDER — TECHNETIUM TC 99M DIETHYLENETRIAME-PENTAACETIC ACID
32.4000 | Freq: Once | INTRAVENOUS | Status: AC | PRN
Start: 2017-04-29 — End: 2017-04-29
  Administered 2017-04-29: 32.4 via RESPIRATORY_TRACT

## 2017-04-29 MED ORDER — INSULIN ASPART 100 UNIT/ML ~~LOC~~ SOLN
10.0000 [IU] | Freq: Three times a day (TID) | SUBCUTANEOUS | Status: DC
  Administered 2017-04-29 – 2017-04-30 (×4): 10 [IU] via SUBCUTANEOUS

## 2017-04-29 MED ORDER — SODIUM CHLORIDE 0.9 % IV SOLN
1.0000 g | Freq: Once | INTRAVENOUS | Status: AC
  Administered 2017-04-29: 1 g via INTRAVENOUS
  Filled 2017-04-29 (×2): qty 10

## 2017-04-29 NOTE — Progress Notes (Signed)
Results for Raymond [name], Raymond [name] [facility] (MRN [id number]) as of 04/29/2017 06:24  Ref. Range 04/29/2017 04:12  Potassium Latest Ref Range: 3.5 - 5.1 mmol/L 6.6 (HH)  MD paged. Awaiting ret call.

## 2017-04-29 NOTE — Progress Notes (Signed)
   Consult note reviewed. Nothing further to add.  Please let us know if we can be of further assistance.   Candee Furbish, MD

## 2017-04-29 NOTE — Progress Notes (Signed)
ANTICOAGULATION CONSULT NOTE - Initial Consult  Pharmacy Consult for heparin Indication: suspected PE  No Known Allergies  Patient Measurements: Height: 6\' 2"  (188 cm) Weight: (!) 373 lb 7.4 oz (169.4 kg) IBW/kg (Calculated) : 82.2 Heparin Dosing Weight: 120 kg  Vital Signs: Temp: 98.9 F (37.2 C) (03/30 0551) Temp Source: Oral (03/30 0551) BP: 109/58 (03/30 0551) Pulse Rate: 73 (03/30 0551)  Labs: Recent Labs    04/28/17 1214 04/28/17 1733 04/28/17 2244 04/29/17 0412  HGB 10.2*  --   --  9.9*  HCT 31.6*  --   --  31.4*  PLT 333  --   --  309  LABPROT 13.9  --   --   --   INR 1.07  --   --   --   HEPARINUNFRC  --   --  0.33 0.29*  CREATININE 2.69*  --   --  2.17*  TROPONINI  --  <0.03 <0.03 <0.03    Estimated Creatinine Clearance: 65.2 mL/min (A) (by C-G formula based on SCr of 2.17 mg/dL (H)).  Assessment: CC/HPI: 52 yo m presenting with acute dizziness and SOB, with PMH of HF, HTN, consulted for heparin d/t suspected acute PE.  Initial heparin level therapeutic at 0.33, now subtherapeutic at 0.29 on 2000 units/hr.  Plts wnl, H/H low but stable and no bleeding noted at this time.  Goal of Therapy:  Heparin level 0.3-0.7 units/ml Monitor platelets by anticoagulation protocol: Yes   Plan:  Increase heparin gtt to 2100 units/hr F/u 1400 level F/u PE workup Monitor daily heparin level, CBC, s/s bleeding  Bertis Ruddy, PharmD Pharmacy Resident Pager #: [phone] 04/29/2017 8:01 AM

## 2017-04-29 NOTE — Progress Notes (Signed)
[facility]                                                                              Patient Demographics  Raymond [name], is a 52 y.o. male, DOB - 11-14-1965, BPZ:[id number]  Admit date - 04/28/2017   Admitting Physician Elwin Mocha, MD  Outpatient Primary MD for the patient is [facility]  Outpatient specialists:   LOS - 1  days   Medical records reviewed and are as summarized below:    Chief Complaint  Patient presents with  . Near Syncope       Brief summary  Patient is a 52 year old male with chronic diastolic CHF on diuretics, chronic hypoxemic respiratory failure on home O2, non-complaint, OHS/OSA, hypertension, dyslipidemia, morbid obesity, insulin-dependent diabetes mellitus, PVD presented with generalized weakness during a field trip with his son.  Patient was noted to be hypoxic with O2 sats 86% on room air, felt shortness of breath with mild central chest pressure.  BP was suboptimal in low 90s as well as 87/55.  BMET showed BUN 16, creatinine 2.69, BNP normal, lactic acid 2.23, d-dimer elevated at 3.05.  Cardiology was consulted, patient was started on IV heparin until PE ruled out.  Assessment & Plan    Principal Problem:   Acute kidney injury ([place]) -Creatinine 2.69 at the time of admission, baseline 1.1.  May have a possible underlying chronic kidney disease, recent labs drawn at outpatient clinic show creatinine of 2.0.  Creatinine improving, 2.1 today -Resented with dizziness, near syncope, hypotension, possibly due to regular use of diuretics, ACE inhibitor, beta-blocker, metformin -BP currently stable and improving, currently oral antihypertensives held -Lactic acidosis improved from 2.23 -> 1.8 with gentle hydration -Continue to hold diuretics, ACE inhibitor.  Will resume beta-blocker once BP stable -Obtain renal ultrasound, underlying diabetes, may have progression of chronic kidney disease  Active  Problems:   Acute on chronic respiratory failure with hypoxia ([place]): Noncompliant with home O2 underlying OSA,, OHS -Currently sats 98% on 2 L, VQ scan pending to rule out PE.  Unable to do CT angiogram secondary to acute kidney injury -Respiratory virus panel negative    Hypotension -BP currently stable, continue to hold beta-blocker, ACE inhibitor, torsemide  Atypical chest pain with underlying history of chronic diastolic CHF -BNP normal 14, not in acute CHF -Troponins x3 negative.  2D echo showed EF of 60-65%, normal wall motion, no regional wall motion abnormalities    PVD (peripheral vascular disease) (H[facility]) -Continue aspirin    Morbid obesity with BMI of 45.0-49.9, adult ([place]) -Counseled on diet and weight control    Diabetes mellitus type 2, insulin dependent ([place]) with hyperglycemia -Uncontrolled, hemoglobin A1c 9.0 -Patient on high dose of insulin outpatient, continue Levemir 89 units twice a day, NovoLog meal coverage 10 units TID AC (states he is on 60 units tid ac), sliding scale insulin -Diabetic coordinator consult  Strongly recommend diet and weight control  Hyperkalemia - Given Kayexalate 30 g x1, calcium gluconate, recheck potassium at 12pm   OSA on CPAP -Continue CPAP at bedtime    Chronic diastolic heart failure, NYHA class 1 (HCC) -BNP  14.2, 2D echo showed EF of 60-65% with normal wall motion Currently euvolemic, continue to hold torsemide, Coreg, Vasotec, spironolactone,    Code Status: Full CODE STATUS DVT Prophylaxis: Currently on heparin drip Family Communication: Discussed in detail with the patient, all imaging results, lab results explained to the patient    Disposition Plan: Possible DC home in a.m.  Time Spent in minutes 35 minutes  Procedures:  2D echo  Consultants:   Cardiology  Antimicrobials:      Medications  Scheduled Meds: . aspirin EC  81 mg Oral Daily  . gabapentin  300 mg Oral TID  . insulin aspart  0-15 Units  Subcutaneous TID WC  . insulin aspart  0-5 Units Subcutaneous QHS  . insulin aspart  10 Units Subcutaneous TID WC  . insulin detemir  89 Units Subcutaneous BID  . mouth rinse  15 mL Mouth Rinse BID  . omega-3 acid ethyl esters  1 g Oral Daily  . sodium chloride flush  3 mL Intravenous Q12H   Continuous Infusions: . sodium chloride 100 mL/hr at 04/28/17 2318  . calcium gluconate    . heparin 2,100 Units/hr (04/29/17 0814)   PRN Meds:.acetaminophen **OR** acetaminophen, ondansetron **OR** ondansetron (ZOFRAN) IV   Antibiotics   Anti-infectives (From admission, onward)   None        Subjective:   Raymond [name] was seen and examined today.  Feels better today, denies any specific complaints, no chest pain. Patient denies dizziness, abdominal pain, N/V/D/C, new weakness, numbess, tingling. No acute events overnight.    Objective:   Vitals:   04/28/17 2130 04/28/17 2244 04/29/17 0551 04/29/17 0900  BP: (!) 110/57 (!) 107/42 (!) 109/58 (!) 122/55  Pulse:  63 73 66  Resp: 16 20 19 20   Temp:  98.6 F (37 C) 98.9 F (37.2 C) 97.6 F (36.4 C)  TempSrc:  Oral Oral Oral  SpO2:  97% 98% 98%  Weight:  (!) 169.4 kg (373 lb 7.4 oz)    Height:  6\' 2"  (1.88 m)      Intake/Output Summary (Last 24 hours) at 04/29/2017 1045 Last data filed at 04/29/2017 0600 Gross per 24 hour  Intake 2948 ml  Output 2000 ml  Net 948 ml     Wt Readings from Last 3 Encounters:  04/28/17 (!) 169.4 kg (373 lb 7.4 oz)  03/24/17 (!) 169.2 kg (373 lb 2 oz)  03/07/17 (!) 166.9 kg (368 lb)     Exam  General: Alert and oriented x 3, NAD  Eyes:   HEENT:  Atraumatic, normocephalic  Cardiovascular: S1 S2 auscultated, no rubs, murmurs or gallops. Regular rate and rhythm.  Respiratory: Clear to auscultation bilaterally, no wheezing, rales or rhonchi  Gastrointestinal: Morbidly obese, soft, nontender, nondistended, + bowel sounds  Ext: 1+ pedal edema bilaterally  Neuro: AAOx3, Cr N's II-  XII. Strength 5/5 upper and lower extremities bilaterally, speech clear, sensations grossly intact  Musculoskeletal: No digital cyanosis, clubbing  Skin: No rashes  Psych: Normal affect and demeanor, alert and oriented x3    Data Reviewed:  I have personally reviewed following labs and imaging studies  Micro Results Recent Results (from the past 240 hour(s))  Respiratory Panel by PCR     Status: None   Collection Time: 04/28/17  4:36 PM  Result Value Ref Range Status   Adenovirus NOT DETECTED NOT DETECTED Final   Coronavirus 229E NOT DETECTED NOT DETECTED Final   Coronavirus HKU1 NOT DETECTED NOT DETECTED Final  Coronavirus NL63 NOT DETECTED NOT DETECTED Final   Coronavirus OC43 NOT DETECTED NOT DETECTED Final   Metapneumovirus NOT DETECTED NOT DETECTED Final   Rhinovirus / Enterovirus NOT DETECTED NOT DETECTED Final   Influenza A NOT DETECTED NOT DETECTED Final   Influenza B NOT DETECTED NOT DETECTED Final   Parainfluenza Virus 1 NOT DETECTED NOT DETECTED Final   Parainfluenza Virus 2 NOT DETECTED NOT DETECTED Final   Parainfluenza Virus 3 NOT DETECTED NOT DETECTED Final   Parainfluenza Virus 4 NOT DETECTED NOT DETECTED Final   Respiratory Syncytial Virus NOT DETECTED NOT DETECTED Final   Bordetella pertussis NOT DETECTED NOT DETECTED Final   Chlamydophila pneumoniae NOT DETECTED NOT DETECTED Final   Mycoplasma pneumoniae NOT DETECTED NOT DETECTED Final    Comment: Performed at [facility], [place] [address] [place], [place] [address]    Radiology Reports Dg Chest Port 1 View  Result Date: 04/28/2017 CLINICAL DATA:  Chest pain for 1 week with shortness of breath. History of congestive heart failure and chronic respiratory failure. EXAM: PORTABLE CHEST 1 VIEW COMPARISON:  CT 01/16/2017.  Radiographs 01/13/2017. FINDINGS: 1219 hours. Low lung volumes. The heart size and mediastinal contours are stable. The previously demonstrated bibasilar airspace opacities have partially  cleared. There is vascular congestion and possible mild edema. No pneumothorax or significant pleural effusion. The bones appear unremarkable. Telemetry leads overlie the chest. IMPRESSION: Compared with the studies of 3 months ago, the bibasilar airspace opacities have partially cleared but not completely. Possible mild pulmonary edema. Suggest two view exam when the patient is capable. Electronically Signed   By: Richardean Sale M.D.   On: 04/28/2017 12:45    Lab Data:  CBC: Recent Labs  Lab 04/28/17 1214 04/29/17 0412  WBC 9.0 8.8  HGB 10.2* 9.9*  HCT 31.6* 31.4*  MCV 83.2 85.1  PLT 333 025   Basic Metabolic Panel: Recent Labs  Lab 04/28/17 1214 04/29/17 0412  NA 138 138  K 5.1 6.6*  CL 105 106  CO2 22 22  GLUCOSE 108* 223*  BUN 69* 66*  CREATININE 2.69* 2.17*  CALCIUM 8.9 8.6*   GFR: Estimated Creatinine Clearance: 65.2 mL/min (A) (by C-G formula based on SCr of 2.17 mg/dL (H)). Liver Function Tests: Recent Labs  Lab 04/28/17 1214  AST 17  ALT 18  ALKPHOS 76  BILITOT 0.6  PROT 6.7  ALBUMIN 3.6   No results for input(s): LIPASE, AMYLASE in the last 168 hours. No results for input(s): AMMONIA in the last 168 hours. Coagulation Profile: Recent Labs  Lab 04/28/17 1214  INR 1.07   Cardiac Enzymes: Recent Labs  Lab 04/28/17 1733 04/28/17 2244 04/29/17 0412  TROPONINI <0.03 <0.03 <0.03   BNP (last 3 results) No results for input(s): PROBNP in the last 8760 hours. HbA1C: Recent Labs    04/28/17 1733  HGBA1C 9.0*   CBG: Recent Labs  Lab 04/28/17 1220 04/28/17 2006 04/28/17 2237 04/29/17 0754  GLUCAP 114* 190* 226* 175*   Lipid Profile: No results for input(s): CHOL, HDL, LDLCALC, TRIG, CHOLHDL, LDLDIRECT in the last 72 hours. Thyroid Function Tests: No results for input(s): TSH, T4TOTAL, FREET4, T3FREE, THYROIDAB in the last 72 hours. Anemia Panel: No results for input(s): VITAMINB12, FOLATE, FERRITIN, TIBC, IRON, RETICCTPCT in the last 72  hours. Urine analysis:    Component Value Date/Time   COLORURINE STRAW (A) 04/28/2017 [place] 04/28/2017 1214   LABSPEC 1.006 04/28/2017 1214   PHURINE 5.0 04/28/2017 1214  GLUCOSEU NEGATIVE 04/28/2017 [place] 04/28/2017 1214   BILIRUBINUR NEGATIVE 04/28/2017 1214   KETONESUR NEGATIVE 04/28/2017 1214   PROTEINUR NEGATIVE 04/28/2017 1214   NITRITE NEGATIVE 04/28/2017 1214   LEUKOCYTESUR NEGATIVE 04/28/2017 1214     Ripudeep Rai M.D. [facility] 04/29/2017, 10:45 AM  Pager: [phone] Between 7am to 7pm - call Pager - [phone]  After 7pm go to [email or web address] - password [email or web address]  Call night coverage person covering after 7pm

## 2017-04-29 NOTE — Progress Notes (Signed)
Bilateral lower extremity venous duplex completed. There is no obvious evidence of DVT, superficial thrombosis, or Baker's cyst. Toma Copier, RVS 04/29/2017, 2:04

## 2017-04-30 DIAGNOSIS — R079 Chest pain, unspecified: Secondary | ICD-10-CM | POA: Diagnosis not present

## 2017-04-30 DIAGNOSIS — R0602 Shortness of breath: Secondary | ICD-10-CM | POA: Diagnosis present

## 2017-04-30 DIAGNOSIS — N179 Acute kidney failure, unspecified: Secondary | ICD-10-CM | POA: Diagnosis not present

## 2017-04-30 DIAGNOSIS — J9621 Acute and chronic respiratory failure with hypoxia: Secondary | ICD-10-CM | POA: Diagnosis not present

## 2017-04-30 DIAGNOSIS — Z23 Encounter for immunization: Secondary | ICD-10-CM | POA: Diagnosis not present

## 2017-04-30 DIAGNOSIS — Y92009 Unspecified place in unspecified non-institutional (private) residence as the place of occurrence of the external cause: Secondary | ICD-10-CM | POA: Diagnosis not present

## 2017-04-30 LAB — CBC
HCT: 33.7 % — ABNORMAL LOW (ref 39.0–52.0)
Hemoglobin: 10.5 g/dL — ABNORMAL LOW (ref 13.0–17.0)
MCH: 26.4 pg (ref 26.0–34.0)
MCHC: 31.2 g/dL (ref 30.0–36.0)
MCV: 84.9 fL (ref 78.0–100.0)
Platelets: 301 10*3/uL (ref 150–400)
RBC: 3.97 MIL/uL — ABNORMAL LOW (ref 4.22–5.81)
RDW: 14.4 % (ref 11.5–15.5)
WBC: 8.5 10*3/uL (ref 4.0–10.5)

## 2017-04-30 LAB — BASIC METABOLIC PANEL
Anion gap: 9 (ref 5–15)
BUN: 32 mg/dL — ABNORMAL HIGH (ref 6–20)
CO2: 25 mmol/L (ref 22–32)
Calcium: 9.5 mg/dL (ref 8.9–10.3)
Chloride: 109 mmol/L (ref 101–111)
Creatinine, Ser: 1.36 mg/dL — ABNORMAL HIGH (ref 0.61–1.24)
GFR calc Af Amer: >60 mL/min (ref >60)
GFR calc non Af Amer: 58 mL/min — ABNORMAL LOW (ref >60)
Glucose, Bld: 180 mg/dL — ABNORMAL HIGH (ref 65–99)
Potassium: 5.2 mmol/L — ABNORMAL HIGH (ref 3.5–5.1)
Sodium: 143 mmol/L (ref 135–145)

## 2017-04-30 LAB — GLUCOSE, CAPILLARY: Glucose-Capillary: 163 mg/dL — ABNORMAL HIGH (ref 65–99)

## 2017-04-30 MED ORDER — TORSEMIDE 20 MG PO TABS: 40.0000 mg | ORAL_TABLET | Freq: Every day | ORAL | 2 refills | Status: DC

## 2017-04-30 MED ORDER — SODIUM POLYSTYRENE SULFONATE 15 GM/60ML PO SUSP
30.0000 g | Freq: Once | ORAL | Status: AC
  Administered 2017-04-30: 30 g via ORAL
  Filled 2017-04-30: qty 120

## 2017-04-30 MED ORDER — CARVEDILOL 6.25 MG PO TABS: 6.2500 mg | ORAL_TABLET | Freq: Two times a day (BID) | ORAL | 11 refills | Status: DC

## 2017-04-30 NOTE — Discharge Summary (Signed)
Physician Discharge Summary   [name] MRN: [id number] DOB/AGE: [birth year] 54 y.o.  Admit date: 04/28/2017 Discharge date: 04/30/2017  Primary Care Physician:  [facility]   Recommendations for Outpatient Follow-up:  1. Follow up with PCP in 1-2 weeks 2. Please obtain BMP/CBC in one week  Home Health: None Equipment/Devices: None  Discharge Condition: stable CODE STATUS: FULL Diet recommendation: Carb modified diet   Discharge Diagnoses:    . Acute on chronic respiratory failure with hypoxia ([place]) . Acute kidney injury ([place]) with possible CKD stage II . Hypotension . PVD (peripheral vascular disease) ([place]) . Hypertension . Chronic diastolic heart failure, NYHA class 1 ([place]) . Atypical chest pain Hyperkalemia OSA on CPAP Morbid obesity  Consults: Cardiology    Allergies:  No Known Allergies   DISCHARGE MEDICATIONS: Allergies as of 04/30/2017   No Known Allergies     Medication List    STOP taking these medications   enalapril 20 MG tablet Commonly known as:  VASOTEC   spironolactone 25 MG tablet Commonly known as:  ALDACTONE     TAKE these medications   aspirin EC 81 MG tablet Take 81 mg by mouth daily.   carvedilol 6.25 MG tablet Commonly known as:  COREG Take 1 tablet (6.25 mg total) by mouth 2 (two) times daily. What changed:    medication strength  how much to take   CENTRUM SILVER 50+MEN Tabs Take 1 tablet by mouth daily.   gabapentin 300 MG capsule Commonly known as:  NEURONTIN Take 300 mg by mouth 3 (three) times daily.   insulin detemir 100 UNIT/ML injection Commonly known as:  LEVEMIR Inject 89 Units into the skin 2 (two) times daily.   insulin lispro 100 UNIT/ML cartridge Commonly known as:  HUMALOG Inject 60 Units into the skin 3 (three) times daily with meals.   metFORMIN 1000 MG tablet Commonly known as:  GLUCOPHAGE Take 1,000 mg by mouth 2 (two) times daily with a  meal.   omega-3 acid ethyl esters 1 g capsule Commonly known as:  LOVAZA Take 1 g by mouth daily.   pantoprazole 40 MG tablet Commonly known as:  PROTONIX Take 40 mg by mouth daily.   torsemide 20 MG tablet Commonly known as:  DEMADEX Take 2 tablets (40 mg total) by mouth daily. Start taking on:  05/01/2017 What changed:  when to take this        Brief H and P: For complete details please refer to admission H and P, but in brief Patient is a 52 year old male with chronic diastolic CHF on diuretics, chronic hypoxemic respiratory failure on home O2, non-complaint, OHS/OSA, hypertension, dyslipidemia, morbid obesity, insulin-dependent diabetes mellitus, PVD presented with generalized weakness during a field trip with his son.  Patient was noted to be hypoxic with O2 sats 86% on room air, felt shortness of breath with mild central chest pressure.  BP was suboptimal in low 90s as well as 87/55.  BMET showed BUN 16, creatinine 2.69, BNP normal, lactic acid 2.23, d-dimer elevated at 3.05.  Cardiology was consulted, patient was started on IV heparin until PE ruled out and admitted for further workup.   Hospital Course:  Acute kidney injury [facility]) with possible CKD stage II -Creatinine 2.69 at the time of admission, baseline 1.1.  May have a possible underlying chronic kidney disease, recent labs drawn at outpatient clinic show creatinine of 2.0.  -Presented with dizziness, near syncope, hypotension, possibly due to regular use of diuretics,  ACE inhibitor, beta-blocker, metformin -Lactic acidosis improved from 2.23 -> 1.8 with gentle hydration -All antihypertensives were held including the diuretics -Renal ultrasound showed no obstruction or hydronephrosis, creatinine 1.3 at the time of discharge. -Spironolactone, Vasotec were discontinued.  Patient was recommended to follow-up with his PCP in 1-2 weeks, repeat labs, may initiate low-dose of ACE inhibitor for renal protection secondary to diabetes  if no hyperkalemia or renal insufficiency -he was on torsemide 40 mg twice a day, decrease to 40 mg once a day to start on 4/1.  Coreg also decreased to 6.25 mg twice a day.     Acute on chronic respiratory failure with hypoxia ([place]): Noncompliant with home O2 underlying OSA,, OHS -Much improved, feels back at baseline. Respiratory virus panel negative -Patient was started on heparin drip due to concern for possible PE, VQ scan was negative for pulmonary embolism    Hypotension, near syncope-Presented with BP in 80s-90s, near syncopal -BP now stable, BP 145/64 this morning, restarted Coreg 6.25 mg twice a day, patient may resume half dose of torsemide on 4/1.  Spironolactone and Vasotec discontinued. -Troponins negative, 2D echo showed EF of 60-65%, normal wall motion, no regional wall motion abnormalities  Atypical chest pain with underlying history of chronic diastolic CHF -BNP normal 14, not in acute CHF -Troponins x3 negative.  2D echo showed EF of 60-65%, normal wall motion, no regional wall motion abnormalities    PVD (peripheral vascular disease) (HCC) -Continue aspirin    Morbid obesity with BMI of 45.0-49.9, adult ([place]) -Counseled on diet and weight control, extensive counseling was provided on diet  Diabetes mellitus type 2, insulin-dependent, uncontrolled with hyperglycemia -Hemoglobin A1c 9.0, patient on high doses of insulin outpatient, continued. -Recommended diet and weight control, follow-up with his PCP for any other changes to the insulin.   Day of Discharge S: No complaints, eager to go home  BP (!) 145/64 (BP Location: Right Arm)   Pulse 65   Temp 97.9 F (36.6 C) (Oral)   Resp 18   Ht 6\' 2"  (1.88 m)   Wt (!) 169.4 kg (373 lb 7.5 oz)   SpO2 98%   BMI 47.95 kg/m   Physical Exam: General: Alert and awake oriented x3 not in any acute distress. HEENT: anicteric sclera, pupils reactive to light and accommodation CVS: S1-S2 clear no murmur rubs or  gallops Chest: clear to auscultation bilaterally, no wheezing rales or rhonchi Abdomen: Obesity, soft nontender, nondistended, normal bowel sounds Extremities: no cyanosis, clubbing or edema noted bilaterally Neuro: Cranial nerves II-XII intact, no focal neurological deficits   The results of significant diagnostics from this hospitalization (including imaging, microbiology, ancillary and laboratory) are listed below for reference.      Procedures/Studies: 2D echo  - Procedure narrative: Transthoracic echocardiography. Image   quality was suboptimal. The study was technically difficult, as a   result of body habitus. Intravenous contrast (Definity) was   administered. - Left ventricle: The cavity size was normal. Wall thickness was   increased in a pattern of mild LVH. Systolic function was normal.   The estimated ejection fraction was in the range of 60% to 65%.   Wall motion was normal; there were no regional wall motion   abnormalities.  Impressions:  - Technically difficult study. Very limited echo windows. LVEF   60-65% by Definity contrast, mild LVH and grossly normal wall   motion - cannot further evalute cardiac structures.    US Renal  Result Date: 04/29/2017 CLINICAL DATA:  Acute renal failure. EXAM: RENAL / URINARY TRACT ULTRASOUND COMPLETE COMPARISON:  08/06/2007 CT. FINDINGS: Right Kidney: Length: 13.1 cm.  No hydronephrosis.  Normal renal echogenicity. Left Kidney: Length: 12.4 cm.  No hydronephrosis.  Normal renal echogenicity Bladder: Appears normal for degree of bladder distention. IMPRESSION: Normal renal ultrasound. No hydronephrosis or explanation for renal failure. Electronically Signed   By: Abigail Miyamoto M.D.   On: 04/29/2017 20:22   Nm Pulmonary Perf And Vent  Result Date: 04/29/2017 CLINICAL DATA:  Suspected pulmonary embolism.  Positive D-dimer. EXAM: NUCLEAR MEDICINE VENTILATION - PERFUSION LUNG SCAN TECHNIQUE: Ventilation images were obtained in  multiple projections using inhaled aerosol Tc-3m DTPA. Perfusion images were obtained in multiple projections after intravenous injection of Tc-101m-MAA. RADIOPHARMACEUTICALS:  32.4 mCi of Tc-36m DTPA aerosol inhalation and 4.35 mCi Tc29m-MAA IV COMPARISON:  Chest x-ray from yesterday.  Chest CT 01/16/2017 FINDINGS: Ventilation: No focal ventilation defect. Perfusion: No wedge shaped peripheral perfusion defects to suggest acute pulmonary embolism. IMPRESSION: Negative for pulmonary embolism.  No perfusion defects. Electronically Signed   By: Monte Fantasia M.D.   On: 04/29/2017 13:18   Dg Chest Port 1 View  Result Date: 04/28/2017 CLINICAL DATA:  Chest pain for 1 week with shortness of breath. History of congestive heart failure and chronic respiratory failure. EXAM: PORTABLE CHEST 1 VIEW COMPARISON:  CT 01/16/2017.  Radiographs 01/13/2017. FINDINGS: 1219 hours. Low lung volumes. The heart size and mediastinal contours are stable. The previously demonstrated bibasilar airspace opacities have partially cleared. There is vascular congestion and possible mild edema. No pneumothorax or significant pleural effusion. The bones appear unremarkable. Telemetry leads overlie the chest. IMPRESSION: Compared with the studies of 3 months ago, the bibasilar airspace opacities have partially cleared but not completely. Possible mild pulmonary edema. Suggest two view exam when the patient is capable. Electronically Signed   By: Richardean Sale M.D.   On: 04/28/2017 12:45       LAB RESULTS: Basic Metabolic Panel: Recent Labs  Lab 04/29/17 0412 04/29/17 1445 04/30/17 0634  NA 138  --  143  K 6.6* 5.4* 5.2*  CL 106  --  109  CO2 22  --  25  GLUCOSE 223*  --  180*  BUN 66*  --  32*  CREATININE 2.17*  --  1.36*  CALCIUM 8.6*  --  9.5   Liver Function Tests: Recent Labs  Lab 04/28/17 1214  AST 17  ALT 18  ALKPHOS 76  BILITOT 0.6  PROT 6.7  ALBUMIN 3.6   No results for input(s): LIPASE, AMYLASE in  the last 168 hours. No results for input(s): AMMONIA in the last 168 hours. CBC: Recent Labs  Lab 04/29/17 0412 04/30/17 0609  WBC 8.8 8.5  HGB 9.9* 10.5*  HCT 31.4* 33.7*  MCV 85.1 84.9  PLT 309 301   Cardiac Enzymes: Recent Labs  Lab 04/28/17 2244 04/29/17 0412  TROPONINI <0.03 <0.03   BNP: Invalid input(s): POCBNP CBG: Recent Labs  Lab 04/29/17 2127 04/30/17 0811  GLUCAP 189* 163*      Disposition and Follow-up: Discharge Instructions    Diet Carb Modified   Complete by:  As directed    Discharge instructions   Complete by:  As directed    Please STOP Spironolactone and enalapril.  Your torsemide is decreased to daily dose only, start 05/02/17 Coreg also decreased to 6.25mg  twice a day.   It is VERY IMPORTANT that you follow up with a PCP on a regular basis.  Check  your blood glucoses before each meal and at bedtime and maintain a log of your readings.  Bring this log with you when you follow up with your PCP so that he or she can adjust your insulin at your follow up visit.  Avoid fried and food in cans.   Increase activity slowly   Complete by:  As directed        DISPOSITION: [place], [facility]. Schedule an appointment as soon as possible for a visit in 2 week(s).   Specialty:  General Practice Why:  need follow up labs for kidney function and potassium  Contact information: [place] [address] [phone]            Time coordinating discharge:  35 minutes  Signed:   Estill Cotta M.D. [facility] 04/30/2017, 11:47 AM Pager: [phone]

## 2017-04-30 NOTE — Progress Notes (Signed)
Inpatient Diabetes Program Recommendations  AACE/ADA: New Consensus Statement on Inpatient Glycemic Control (2015)  Target Ranges:  Prepandial:   less than 140 mg/dL      Peak postprandial:   less than 180 mg/dL (1-2 hours)      Critically ill patients:  140 - 180 mg/dL   Lab Results  Component Value Date   GLUCAP 163 (H) 04/30/2017   HGBA1C 9.0 (H) 04/28/2017    Review of Glycemic Control  Diabetes history: DM2 Outpatient Diabetes medications: Levemir 89 units bid, Humalog 60 units tidwc, metformin 1000 mg bid Current orders for Inpatient glycemic control:  Levemir 89 units bid, Novolog 0-15 units tidwc and hs + 10 units tidwc  HgbA1C - 9% - indicates poor control PTA CBGs past 24H - 163-196 mg/dL.  Inpatient Diabetes Program Recommendations:     Agree with orders. If post-prandial blood sugars > 180 mg/dL, increase meal coverage insulin.  Thank you. Raymond [name], RD, LDN, CDE Inpatient Diabetes Coordinator [phone]

## 2017-09-05 ENCOUNTER — Encounter: Payer: Medicare PPO | Attending: Nurse Practitioner | Admitting: Nurse Practitioner

## 2017-09-05 DIAGNOSIS — E11621 Type 2 diabetes mellitus with foot ulcer: Secondary | ICD-10-CM | POA: Diagnosis present

## 2017-09-05 DIAGNOSIS — L97522 Non-pressure chronic ulcer of other part of left foot with fat layer exposed: Secondary | ICD-10-CM | POA: Diagnosis not present

## 2017-09-12 ENCOUNTER — Encounter: Payer: Medicare PPO | Admitting: Nurse Practitioner

## 2017-09-12 DIAGNOSIS — E11621 Type 2 diabetes mellitus with foot ulcer: Secondary | ICD-10-CM | POA: Diagnosis not present

## 2017-09-14 NOTE — Progress Notes (Signed)
HILLIS, MCPHATTER ([id number]) Visit Report for 09/05/2017 Abuse/Suicide Risk Screen Details Patient Name: Raymond [name], Raymond [name]. Date of Service: 09/05/2017 8:00 AM Medical Record Number: [id number] Patient Account Number: [id number] Date of Birth/Sex: 1966/01/03 (52 y.o. Male) Treating RN: Secundino Ginger Primary Care Ysmael Hires: [name] Other Clinician: Referring Shivaan Tierno: [name] Treating Claramae Rigdon/Extender: Cathie Olden in Treatment: 0 Abuse/Suicide Risk Screen Items Answer ABUSE/SUICIDE RISK SCREEN: Has anyone close to you tried to hurt or harm you recentlyo No Do you feel uncomfortable with anyone in your familyo No Has anyone forced you do things that you didnot want to doo No Do you have any thoughts of harming yourselfo No Patient displays signs or symptoms of abuse and/or neglect. No Electronic Signature(s) Signed: 09/05/2017 11:39:26 AM By: Secundino Ginger Entered By: Secundino Ginger on 09/05/2017 08:37:51 Raymond [name] ([id number]) -------------------------------------------------------------------------------- Activities of Daily Living Details Patient Name: Raymond [name]. Date of Service: 09/05/2017 8:00 AM Medical Record Number: [id number] Patient Account Number: [id number] Date of Birth/Sex: 09-09-1965 (52 y.o. Male) Treating RN: Secundino Ginger Primary Care Mohsin Crum: [name] Other Clinician: Referring Avigdor Dollar: [name] Treating Samar Venneman/Extender: Cathie Olden in Treatment: 0 Activities of Daily Living Items Answer Activities of Daily Living (Please select one for each item) Drive Automobile Completely Able Take Medications Completely Able Use Telephone Completely Able Care for Appearance Completely Able Use Toilet Completely Able Bath / Shower Completely Able Dress Self Completely Able Feed Self Completely Able Walk Completely Able Get In / Out Bed Completely Able Housework Completely Able Prepare Meals Completely  [place] for Self Completely Able Electronic Signature(s) Signed: 09/05/2017 11:39:26 AM By: Secundino Ginger Entered By: Secundino Ginger on 09/05/2017 08:39:15 Raymond [name] ([id number]) -------------------------------------------------------------------------------- Education Assessment Details Patient Name: Raymond [name]. Date of Service: 09/05/2017 8:00 AM Medical Record Number: [id number] Patient Account Number: [id number] Date of Birth/Sex: 10-18-1965 (52 y.o. Male) Treating RN: Secundino Ginger Primary Care Ivionna Verley: [name] Other Clinician: Referring Kathrynne Kulinski: [name] Treating Elis Rawlinson/Extender: Cathie Olden in Treatment: 0 Learning Preferences/Education Level/Primary Language Learning Preference: Explanation Highest Education Level: College or Above Preferred Language: English Cognitive Barrier Assessment/Beliefs Language Barrier: No Translator Needed: No Memory Deficit: No Emotional Barrier: No Cultural/Religious Beliefs Affecting Medical Care: No Physical Barrier Assessment Impaired Hearing: Yes decreased hearing bilat Decreased Hand dexterity: No Knowledge/Comprehension Assessment Knowledge Level: High Comprehension Level: High Ability to understand written High instructions: Ability to understand verbal High instructions: Motivation Assessment Anxiety Level: Calm Cooperation: Cooperative Education Importance: Acknowledges Need Interest in Health Problems: Asks Questions Perception: Coherent Willingness to Engage in Self- High Management Activities: Readiness to Engage in Self- High Management Activities: Electronic Signature(s) Signed: 09/05/2017 11:39:26 AM By: Secundino Ginger Entered By: Secundino Ginger on 09/05/2017 08:40:47 Raymond [name] ([id number]) -------------------------------------------------------------------------------- Fall Risk Assessment Details Patient Name: Raymond [name]. Date  of Service: 09/05/2017 8:00 AM Medical Record Number: [id number] Patient Account Number: [id number] Date of Birth/Sex: 14-Mar-1965 (52 y.o. Male) Treating RN: Secundino Ginger Primary Care Skyrah Krupp: [name] Other Clinician: Referring Vladimir Lenhoff: [name] Treating Maayan Jenning/Extender: Cathie Olden in Treatment: 0 Fall Risk Assessment Items Have you had 2 or more falls in the last 12 monthso 0 No Have you had any fall that resulted in injury in the last 12 monthso 0 No FALL RISK ASSESSMENT: History of falling - immediate or within 3 months 0 No Secondary diagnosis 0 No Ambulatory aid None/bed rest/wheelchair/nurse 0 No Crutches/cane/walker 0 No Furniture 0 No IV Access/Saline Lock 0 No Gait/Training Normal/bed  rest/immobile 0 No Weak 0 No Impaired 0 No Mental Status Oriented to own ability 0 No Electronic Signature(s) Signed: 09/05/2017 11:39:26 AM By: Secundino Ginger Entered By: Secundino Ginger on 09/05/2017 08:41:12 Raymond [name] ([id number]) -------------------------------------------------------------------------------- Foot Assessment Details Patient Name: Raymond [name]. Date of Service: 09/05/2017 8:00 AM Medical Record Number: [id number] Patient Account Number: [id number] Date of Birth/Sex: Jun 18, 1965 (52 y.o. Male) Treating RN: Secundino Ginger Primary Care Ashyla Luth: [name] Other Clinician: Referring Ceanna Wareing: [name] Treating Valmore Arabie/Extender: Cathie Olden in Treatment: 0 Foot Assessment Items Site Locations + = Sensation present, - = Sensation absent, C = Callus, U = Ulcer R = Redness, W = Warmth, M = Maceration, PU = Pre-ulcerative lesion F = Fissure, S = Swelling, D = Dryness Assessment Right: Left: Other Deformity: No No Prior Foot Ulcer: Yes Yes Prior Amputation: Yes Yes Charcot Joint: No No Ambulatory Status: Ambulatory Without Help Gait: Steady Electronic Signature(s) Signed: 09/05/2017 11:39:26 AM By: Secundino Ginger Entered By:  Secundino Ginger on 09/05/2017 08:45:28 Raymond [name] ([id number]) -------------------------------------------------------------------------------- Nutrition Risk Assessment Details Patient Name: Raymond [name]. Date of Service: 09/05/2017 8:00 AM Medical Record Number: [id number] Patient Account Number: [id number] Date of Birth/Sex: 24-Jul-1965 (52 y.o. Male) Treating RN: Secundino Ginger Primary Care Tarini Carrier: [name] Other Clinician: Referring Taila Basinski: [name] Treating Fiora Weill/Extender: Cathie Olden in Treatment: 0 Height (in): Weight (lbs): Body Mass Index (BMI): Nutrition Risk Assessment Items NUTRITION RISK SCREEN: I have an illness or condition that made me change the kind and/or amount of 0 No food I eat I eat fewer than two meals per day 0 No I eat few fruits and vegetables, or milk products 0 No I have three or more drinks of beer, liquor or wine almost every day 0 No I have tooth or mouth problems that make it hard for me to eat 0 No I don't always have enough money to buy the food I need 0 No I eat alone most of the time 0 No I take three or more different prescribed or over-the-counter drugs a day 0 No Without wanting to, I have lost or gained 10 pounds in the last six months 0 No I am not always physically able to shop, cook and/or feed myself 0 No Nutrition Protocols Good Risk Protocol Moderate Risk Protocol Electronic Signature(s) Signed: 09/05/2017 11:39:26 AM By: Secundino Ginger Entered BySecundino Ginger on 09/05/2017 08:41:24

## 2017-09-15 NOTE — Progress Notes (Addendum)
TAMARA, KENYON ([id number]) Visit Report for 09/05/2017 Chief Complaint Document Details Patient Name: JOESPH, MARCY. Date of Service: 09/05/2017 8:00 AM Medical Record Number: [id number] Patient Account Number: [id number] Date of Birth/Sex: 1966/01/12 (52 y.o. M) Treating RN: Ahmed Prima Primary Care Provider: [name] Other Clinician: Referring Provider: [name] Treating Provider/Extender: Cathie Olden in Treatment: 0 Information Obtained from: Patient Chief Complaint left foot Electronic Signature(s) Signed: 09/05/2017 9:52:43 AM By: Lawanda Cousins Entered By: Lawanda Cousins on 09/05/2017 09:52:43 [name] ([id number]) -------------------------------------------------------------------------------- Debridement Details Patient Name: [name]. Date of Service: 09/05/2017 8:00 AM Medical Record Number: [id number] Patient Account Number: [id number] Date of Birth/Sex: 08-07-1965 (52 y.o. M) Treating RN: Ahmed Prima Primary Care Provider: [name] Other Clinician: Referring Provider: [name] Treating Provider/Extender: Cathie Olden in Treatment: 0 Debridement Performed for Wound #1 Left,Distal Metatarsal head first Assessment: Performed By: Physician Lawanda Cousins, NP Debridement Type: Debridement Severity of Tissue Pre Fat layer exposed Debridement: Pre-procedure Verification/Time Yes - 09:08 Out Taken: Start Time: 09:08 Pain Control: Lidocaine 4% Topical Solution Total Area Debrided (L x W): 1 (cm) x 0.6 (cm) = 0.6 (cm) Tissue and other material Viable, Non-Viable, Callus, Slough, Subcutaneous, Fibrin/Exudate, Slough debrided: Level: Skin/Subcutaneous Tissue Debridement Description: Excisional Instrument: Curette Bleeding: Moderate Hemostasis Achieved: Silver Nitrate End Time: 09:14 Procedural Pain: 0 Post Procedural Pain: 0 Response to Treatment: Procedure was tolerated  well Level of Consciousness: Awake and Alert Post Debridement Measurements of Total Wound Length: (cm) 1.1 Width: (cm) 0.7 Depth: (cm) 0.3 Volume: (cm) 0.181 Character of Wound/Ulcer Post Debridement: Requires Further Debridement Severity of Tissue Post Debridement: Fat layer exposed Post Procedure Diagnosis Same as Pre-procedure Electronic Signature(s) Signed: 09/05/2017 10:12:05 AM By: Alric Quan Signed: 09/05/2017 3:45:39 PM By: Lawanda Cousins Entered By: Alric Quan on 09/05/2017 10:12:04 [name] ([id number]) -------------------------------------------------------------------------------- HPI Details Patient Name: [name]. Date of Service: 09/05/2017 8:00 AM Medical Record Number: [id number] Patient Account Number: [id number] Date of Birth/Sex: 10/30/1965 (52 y.o. M) Treating RN: Ahmed Prima Primary Care Provider: [name] Other Clinician: Referring Provider: [name] Treating Provider/Extender: Cathie Olden in Treatment: 0 History of Present Illness HPI Description: 09/05/17-He is seeing an initial evaluation for a left plantar foot ulcer. He has a remote history of left great toe amputation. He states that 4-6 weeks ago he noted callus formation and ulceration. He has not seen primary care regarding this. He is not currently on antibiotic therapy. He does not routinely follow with podiatry. He states diabetic foot wear will arrive early next week. The HR shows an A1c of 9% approximate 4 months ago but he states he had one and primary care a few weeks ago but does not know the results. He is neuropathic and does not complain of any pain, he is currently wearing crocs. Electronic Signature(s) Signed: 09/05/2017 9:54:39 AM By: Lawanda Cousins Entered By: Lawanda Cousins on 09/05/2017 09:54:38 [name] ([id number]) -------------------------------------------------------------------------------- Physical Exam  Details Patient Name: [name]. Date of Service: 09/05/2017 8:00 AM Medical Record Number: [id number] Patient Account Number: [id number] Date of Birth/Sex: 1965-02-06 (52 y.o. M) Treating RN: Ahmed Prima Primary Care Provider: [name] Other Clinician: Referring Provider: [name] Treating Provider/Extender: Cathie Olden in Treatment: 0 Respiratory respirations are even and unlabored. clear throughout. Cardiovascular s1 s2 regular rate and rhythm. LLE- palpable DP, non-palpable PT. LLE- warm to touch, no edema. Musculoskeletal ambulates with no assistive devices. Psychiatric appears to have appropriate insight and/or judgement. oriented to  time, place, person and situation. calm, cooperative. Electronic Signature(s) Signed: 09/05/2017 9:55:52 AM By: Lawanda Cousins Entered By: Lawanda Cousins on 09/05/2017 09:55:51 [name] ([id number]) -------------------------------------------------------------------------------- Physician Orders Details Patient Name: [name]. Date of Service: 09/05/2017 8:00 AM Medical Record Number: [id number] Patient Account Number: [id number] Date of Birth/Sex: 1965-10-23 (52 y.o. M) Treating RN: Ahmed Prima Primary Care Provider: [name] Other Clinician: Referring Provider: [name] Treating Provider/Extender: Cathie Olden in Treatment: 0 Verbal / Phone Orders: Yes Clinician: Pinkerton, Debi Read Back and Verified: Yes Diagnosis Coding Wound Cleansing Wound #1 Left,Distal Metatarsal head first o Clean wound with Normal Saline. o Cleanse wound with mild soap and water Anesthetic (add to Medication List) Wound #1 Left,Distal Metatarsal head first o Topical Lidocaine 4% cream applied to wound bed prior to debridement (In Clinic Only). Primary Wound Dressing Wound #1 Left,Distal Metatarsal head first o Alginate - place over the silver collagen o Silver Collagen  - lightly moisten with saline (place in wound first) Secondary Dressing Wound #1 Left,Distal Metatarsal head first o Dry Gauze Dressing Change Frequency Wound #1 Left,Distal Metatarsal head first o Change dressing every other day. o Other: - as needed Follow-up Appointments Wound #1 Left,Distal Metatarsal head first o Return Appointment in 1 week. Edema Control Wound #1 Left,Distal Metatarsal head first o Elevate legs to the level of the heart and pump ankles as often as possible Additional Orders / Instructions Wound #1 Left,Distal Metatarsal head first o Increase protein intake. Patient Medications Allergies: No Known Allergies Notifications Medication Indication Start End lidocaine DOSE 1 - topical 4 % cream - 1 cream topical SHARRON, SIMPSON ([id number]) Electronic Signature(s) Signed: 09/05/2017 3:45:39 PM By: Lawanda Cousins Entered By: Lawanda Cousins on 09/05/2017 09:56:00 [name] ([id number]) -------------------------------------------------------------------------------- Prescription 09/05/2017 Patient Name: [name]. Provider: Lawanda Cousins NP Date of Birth: 09-30-1965 NPI#: [id number] Sex: [name] DEA#: [id number] Phone #: [phone] License #: Patient Address: [facility] [place], [place] [address] [address] [place], [place] [address] [phone] Allergies No Known Allergies Medication Medication: Route: Strength: Form: lidocaine 4 % topical cream topical 4% cream Class: TOPICAL LOCAL ANESTHETICS Dose: Frequency / Time: Indication: 1 1 cream topical Number of Refills: Number of Units: 0 Generic Substitution: Start Date: End Date: One Time Use: Substitution Permitted No Note to Pharmacy: Signature(s): Date(s): Electronic Signature(s) Signed: 09/05/2017 3:45:39 PM By: Lawanda Cousins Entered By: Lawanda Cousins  on 09/05/2017 09:56:01 [name] ([id number]) --------------------------------------------------------------------------------  Problem List Details Patient Name: [name]. Date of Service: 09/05/2017 8:00 AM Medical Record Number: [id number] Patient Account Number: [id number] Date of Birth/Sex: Apr 26, 1965 (52 y.o. 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 Date of Service: 09/05/2017 8:00 AM Medical Record Number: [id number] Patient Account Number: [id number] Date of Birth/Sex: 26-Feb-1965 (52 y.o. M) Treating RN: Ahmed Prima Primary Care Provider: Frederico Hamman,  NICOLE Other Clinician: Referring Provider: [name] Treating Provider/Extender: Cathie Olden in Treatment: 0 Subjective Chief Complaint Information obtained from Patient left foot History of Present Illness (HPI) 09/05/17-He is seeing an initial evaluation for a left plantar foot ulcer. He has a remote history of left great toe amputation. He states that 4-6 weeks ago he noted callus formation and ulceration. He has not seen primary care regarding this. He is not currently on antibiotic therapy. He  does not routinely follow with podiatry. He states diabetic foot wear will arrive early next week. The HR shows an A1c of 9% approximate 4 months ago but he states he had one and primary care a few weeks ago but does not know the results. He is neuropathic and does not complain of any pain, he is currently wearing crocs. Wound History Patient presents with 1 open wound that has been present for approximately 3 weeks. Patient has been treating wound in the following manner: clened and bandaid. Laboratory tests have not been performed in the last month. Patient reportedly has not tested positive for an antibiotic resistant organism. Patient reportedly has not tested positive for osteomyelitis. Patient reportedly has not had testing performed to evaluate circulation in the legs. Patient History Allergies No Known Allergies Family History Diabetes - Mother,Maternal Grandparents, Hypertension - Father, Stroke - Father, No family history of Cancer, Heart Disease, Hereditary Spherocytosis, Kidney Disease, Lung Disease, Seizures, Thyroid Problems, Tuberculosis. Social History Never smoker, Marital Status - Divorced, Alcohol Use - Rarely, Drug Use - No History, Caffeine Use - Never. Medical History Endocrine Patient has history of Type II Diabetes Denies history of Type I Diabetes Patient is treated with Insulin, Oral Agents. Blood sugar is tested. Review of Systems (ROS) Constitutional Symptoms (General Health) The patient has no complaints or symptoms. Eyes Denies complaints or symptoms of Dry Eyes, Vision Changes, Glasses / Contacts. Ear/Nose/Mouth/Throat Denies complaints or symptoms of Difficult clearing ears, Sinusitis. Hematologic/Lymphatic DANIL, WEDGE ([id number]) Denies complaints or symptoms of Bleeding / Clotting Disorders, Human Immunodeficiency Virus. Respiratory Complains or has symptoms of Shortness of Breath - sleep apnea. Denies complaints or symptoms of Chronic  or frequent coughs. Cardiovascular Denies complaints or symptoms of Chest pain, LE edema, chf Gastrointestinal The patient has no complaints or symptoms. Endocrine The patient has no complaints or symptoms. Genitourinary The patient has no complaints or symptoms. Immunological The patient has no complaints or symptoms. Integumentary (Skin) Complains or has symptoms of Wounds - left foot. Denies complaints or symptoms of Bleeding or bruising tendency, Breakdown, Swelling. Musculoskeletal The patient has no complaints or symptoms. Neurologic The patient has no complaints or symptoms. Oncologic The patient has no complaints or symptoms. Psychiatric The patient has no complaints or symptoms. Objective Constitutional Vitals Time Taken: 8:20 AM, Height: 74 in, Source: Stated, Weight: 381 lbs, Source: Measured, BMI: 48.9, Temperature: 98.3 F, Pulse: 73 bpm, Respiratory Rate: 16 breaths/min, Blood Pressure: 126/57 mmHg. Respiratory respirations are even and unlabored. clear throughout. Cardiovascular s1 s2 regular rate and rhythm. LLE- palpable DP, non-palpable PT. LLE- warm to touch, no edema. Musculoskeletal ambulates with no assistive devices. Psychiatric appears to have appropriate insight and/or judgement. oriented to time, place, person and situation. calm, cooperative. Integumentary (Hair, Skin) Wound #1 status is Open. Original cause of wound was Gradually Appeared. The wound is located on the Left,Distal Metatarsal head first. The wound measures 1cm length x 0.9cm width x 0.2cm depth; 0.707cm^2 area and 0.141cm^3 volume. There is Fat Layer (Subcutaneous Tissue) Exposed exposed. There  is tunneling at 2:00 with a maximum distance of 0.9cm. There is a none present amount of drainage noted. The wound margin is indistinct and nonvisible. There is medium MACINTYRE, ALEXA ([id number]) (34-66%) red granulation within the wound bed. There is a medium (34-66%) amount of  necrotic tissue within the wound bed including Adherent Slough. The periwound skin appearance exhibited: Callus, Hemosiderin Staining. The periwound skin appearance did not exhibit: Crepitus, Excoriation, Induration, Rash, Scarring, Dry/Scaly, Maceration, Atrophie Blanche, Cyanosis, Ecchymosis, Mottled, Pallor, Rubor, Erythema. Periwound temperature was noted as No Abnormality. 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 There was a Excisional Skin/Subcutaneous Tissue Debridement with a total area of 0.6 sq cm performed by Lawanda Cousins, NP. With the following instrument(s): Curette to remove Viable and Non-Viable tissue/material. Material removed includes Callus, Subcutaneous Tissue, Slough, and Fibrin/Exudate after achieving pain control using Lidocaine 4% Topical Solution. No specimens were taken. A time out was conducted at 09:08, prior to the start of the procedure. A Moderate amount of bleeding was controlled with Silver Nitrate. The procedure was tolerated well with a pain level of 0 throughout and a pain level of 0 following the procedure. Patient s Level of Consciousness post procedure was recorded as Awake and Alert. Post Debridement Measurements: 1.1cm length x 0.7cm width x 0.3cm depth; 0.181cm^3 volume. Character of Wound/Ulcer Post Debridement requires further debridement. Severity of Tissue Post Debridement is: Fat layer exposed. Post procedure Diagnosis Wound #1: Same as Pre-Procedure Plan Wound Cleansing: Wound #1 Left,Distal Metatarsal head first: Clean wound with Normal Saline. Cleanse wound with mild soap and water Anesthetic (add to Medication  List): Wound #1 Left,Distal Metatarsal head first: Topical Lidocaine 4% cream applied to wound bed prior to debridement (In Clinic Only). Primary Wound Dressing: Wound #1 Left,Distal Metatarsal head first: Alginate - place over the silver collagen Silver Collagen - lightly moisten with saline (place in wound first) Secondary Dressing: Wound #1 Left,Distal Metatarsal head first: Dry Gauze KELDEN, LAVALLEE ([id number]) Dressing Change Frequency: Wound #1 Left,Distal Metatarsal head first: Change dressing every other day. Other: - as needed Follow-up Appointments: Wound #1 Left,Distal Metatarsal head first: Return Appointment in 1 week. Edema Control: Wound #1 Left,Distal Metatarsal head first: Elevate legs to the level of the heart and pump ankles as often as possible Additional Orders / Instructions: Wound #1 Left,Distal Metatarsal head first: Increase protein intake. The following medication(s) was prescribed: lidocaine topical 4 % cream 1 1 cream topical was prescribed at facility Electronic Signature(s) Signed: 09/12/2017 4:25:48 PM By: Lawanda Cousins Previous Signature: 09/05/2017 5:52:45 PM Version By: Lawanda Cousins Previous Signature: 09/05/2017 9:56:10 AM Version By: Lawanda Cousins Entered By: Lawanda Cousins on 09/12/2017 16:25:48 [name] ([id number]) -------------------------------------------------------------------------------- ROS/PFSH Details Patient Name: [name]. Date of Service: 09/05/2017 8:00 AM Medical Record Number: [id number] Patient Account Number: [id number] Date of Birth/Sex: 1965-05-10 (52 y.o. M) Treating RN: Secundino Ginger Primary Care Provider: [name] Other Clinician: Referring Provider: [name] Treating Provider/Extender: Cathie Olden in Treatment: 0 Wound History Do you currently have one or more open woundso Yes How many open wounds do you currently haveo 1 Approximately how long have you had your  woundso 3 weeks How have you been treating your  wound(s) until nowo clened and bandaid Has your wound(s) ever healed and then re-openedo No Have you had any lab work done in the past montho No Have you tested positive for an antibiotic resistant organism (MRSA, VRE)o No Have you tested positive for osteomyelitis (bone infection)o No Have you had any tests for circulation on your legso No Eyes Complaints and Symptoms: Negative for: Dry Eyes; Vision Changes; Glasses / Contacts Ear/Nose/Mouth/Throat Complaints and Symptoms: Negative for: Difficult clearing ears; Sinusitis Hematologic/Lymphatic Complaints and Symptoms: Negative for: Bleeding / Clotting Disorders; Human Immunodeficiency Virus Respiratory Complaints and Symptoms: Positive for: Shortness of Breath - sleep apnea Negative for: Chronic or frequent coughs Cardiovascular Complaints and Symptoms: Negative for: Chest pain; LE edema Review of System Notes: chf Integumentary (Skin) Complaints and Symptoms: Positive for: Wounds - left foot Negative for: Bleeding or bruising tendency; Breakdown; Swelling Constitutional Symptoms (General Health) Complaints and Symptoms: No Complaints or Symptoms ARINZE, RIVADENEIRA ([id number]) Gastrointestinal Complaints and Symptoms: No Complaints or Symptoms Endocrine Complaints and Symptoms: No Complaints or Symptoms Medical History: Positive for: Type II Diabetes Negative for: Type I Diabetes Time with diabetes: 15 years Treated with: Insulin, Oral agents Blood sugar tested every day: Yes Tested : PRN Genitourinary Complaints and Symptoms: No Complaints or Symptoms Immunological Complaints and Symptoms: No Complaints or Symptoms Musculoskeletal Complaints and Symptoms: No Complaints or Symptoms Neurologic Complaints and Symptoms: No Complaints or Symptoms Oncologic Complaints and Symptoms: No Complaints or Symptoms Psychiatric Complaints and Symptoms: No Complaints  or Symptoms Immunizations Pneumococcal Vaccine: Received Pneumococcal Vaccination: No Tetanus Vaccine: Last tetanus shot: 09/05/2016 Implantable Devices Family and Social History Cancer: No; Diabetes: Yes - Mother,Maternal Grandparents; Heart Disease: No; Hereditary Spherocytosis: No; Hypertension: Yes - Father; Kidney Disease: No; Lung Disease: No; Seizures: No; Stroke: Yes - Father; Thyroid Problems: No; Tuberculosis: No; Never smoker; Marital Status - Divorced; Alcohol Use: Rarely; Drug Use: No History; Caffeine Use: AVISHAI, REIHL ([id number]) Never; Financial Concerns: No; Food, Clothing or Shelter Needs: No; Support System Lacking: No; Transportation Concerns: No; Advanced Directives: Yes - glenda (Not Provided); Patient does not want information on Advanced Directives; Do not resuscitate: No Electronic Signature(s) Signed: 09/05/2017 11:39:26 AM By: Secundino Ginger Signed: 09/05/2017 3:45:39 PM By: Lawanda Cousins Entered By: Secundino Ginger on 09/05/2017 08:51:24 [name] ([id number]) -------------------------------------------------------------------------------- [place] Details Patient Name: [name]. Date of Service: 09/05/2017 Medical Record Number: [id number] Patient Account Number: [id number] Date of Birth/Sex: 01-12-1966 (52 y.o. M) Treating RN: Ahmed Prima Primary Care Provider: [name] Other Clinician: Referring Provider: [name] Treating Provider/Extender: Cathie Olden in Treatment: 0 Diagnosis Coding ICD-10 Codes Code Description [phone] Non-pressure chronic ulcer of other part of left foot with fat layer exposed E11.621 Type 2 diabetes mellitus with foot ulcer Facility Procedures CPT4 Code: 16967893 Description: 99213 - WOUND CARE VISIT-LEV 3 EST PT Modifier: Quantity: 1 CPT4 Code: 81017510 Description: 11042 - DEB SUBQ TISSUE 20 SQ CM/< ICD-10 Diagnosis Description L97.522 Non-pressure chronic ulcer of other part  of left foot with fat Modifier: layer exposed Quantity: 1 Physician Procedures CPT4 Code: [id number] Description: WC PHYS LEVEL 3 o NEW PT ICD-10 Diagnosis Description L97.522 Non-pressure chronic ulcer of other part of left foot with fat E11.621 Type 2 diabetes mellitus with foot ulcer Modifier: layer exposed Quantity: 1 CPT4 Code: [id number] Description: 11042 - WC PHYS SUBQ TISS 20 SQ CM ICD-10 Diagnosis Description L97.522 Non-pressure chronic ulcer of other part of left foot with fat Modifier: layer exposed Quantity: 1 Electronic Signature(s) Signed: 09/12/2017 4:26:11  PM By: Lawanda Cousins Previous Signature: 09/05/2017 10:12:31 AM Version By: Alric Quan Previous Signature: 09/05/2017 3:45:39 PM Version By: Lawanda Cousins Previous Signature: 09/05/2017 9:56:35 AM Version By: Lawanda Cousins Entered By: Lawanda Cousins on 09/12/2017 16:26:11

## 2017-09-15 NOTE — Progress Notes (Signed)
Raymond [name], Raymond [name] ([id number]) Visit Report for 09/05/2017 Allergy List Details Patient Name: Raymond [name], Raymond [name]. Date of Service: 09/05/2017 8:00 AM Medical Record Number: [id number] Patient Account Number: [id number] Date of Birth/Sex: Dec 06, 1965 (52 y.o. Male) Treating RN: Secundino Ginger Primary Care Melanie Openshaw: [name] Other Clinician: Referring Bre Pecina: [name] Treating Bobak Oguinn/Extender: Lawanda Cousins Weeks in Treatment: 0 Allergies Active Allergies No Known Allergies Allergy Notes Electronic Signature(s) Signed: 09/05/2017 11:39:26 AM By: Secundino Ginger Entered By: Secundino Ginger on 09/05/2017 08:59:56 Raymond [name] ([id number]) -------------------------------------------------------------------------------- Arrival Information Details Patient Name: Raymond [name]. Date of Service: 09/05/2017 8:00 AM Medical Record Number: [id number] Patient Account Number: [id number] Date of Birth/Sex: 11-Sep-1965 (52 y.o. Male) Treating RN: Secundino Ginger Primary Care Rama Mcclintock: [name] Other Clinician: Referring Jazzlin Clements: [name] Treating Laverda Stribling/Extender: Cathie Olden in Treatment: 0 Visit Information Patient Arrived: Ambulatory Arrival Time: 08:20 Accompanied By: self Transfer Assistance: None Patient Identification Verified: Yes Secondary Verification Process Completed: Yes Electronic Signature(s) Signed: 09/05/2017 11:39:26 AM By: Secundino Ginger Entered By: Secundino Ginger on 09/05/2017 08:21:19 Raymond [name] ([id number]) -------------------------------------------------------------------------------- Clinic Level of Care Assessment Details Patient Name: Raymond [name]. Date of Service: 09/05/2017 8:00 AM Medical Record Number: [id number] Patient Account Number: [id number] Date of Birth/Sex: 02-Apr-1965 (52 y.o. Male) Treating RN: Ahmed Prima Primary Care Demondre Aguas: [name] Other Clinician: Referring Jenaro Souder: [name] Treating Afsana Liera/Extender: Cathie Olden in Treatment: 0 Clinic Level of Care Assessment Items TOOL 1 Quantity Score X - Use when EandM and Procedure is performed on INITIAL visit 1 0 ASSESSMENTS - Nursing Assessment / Reassessment X - General Physical Exam (combine w/ comprehensive assessment (listed just below) when 1 20 performed on new pt. evals) X- 1 25 Comprehensive Assessment (HX, ROS, Risk Assessments, Wounds Hx, etc.) ASSESSMENTS - Wound and Skin Assessment / Reassessment []  - Dermatologic / Skin Assessment (not related to wound area) 0 ASSESSMENTS - Ostomy and/or Continence Assessment and Care []  - Incontinence Assessment and Management 0 []  - 0 Ostomy Care Assessment and Management (repouching, etc.) PROCESS - Coordination of Care X - Simple Patient / Family Education for ongoing care 1 15 []  - 0 Complex (extensive) Patient / Family Education for ongoing care []  - 0 Staff obtains Programmer, systems, Records, Test Results / Process Orders []  - 0 Staff telephones HHA, Nursing Homes / Clarify orders / etc []  - 0 Routine Transfer to another Facility (non-emergent condition) []  - 0 Routine Hospital Admission (non-emergent condition) X- 1 15 New Admissions / Biomedical engineer / Ordering NPWT, Apligraf, etc. []  - 0 Emergency Hospital Admission (emergent condition) PROCESS - Special Needs []  - Pediatric / Minor Patient Management 0 []  - 0 Isolation Patient Management []  - 0 Hearing / Language / Visual special needs []  - 0 Assessment of Community assistance (transportation, D/C planning, etc.) []  - 0 Additional assistance / Altered mentation []  - 0 Support Surface(s) Assessment (bed, cushion, seat, etc.) Raymond [name], Raymond [name] ([id number]) INTERVENTIONS - Miscellaneous []  - External ear exam 0 []  - 0 Patient Transfer (multiple staff / Civil Service fast streamer / Similar devices) []  - 0 Simple Staple / Suture removal (25 or less) []  - 0 Complex Staple / Suture  removal (26 or more) []  - 0 Hypo/Hyperglycemic Management (do not check if billed separately) X- 1 15 Ankle / Brachial Index (ABI) - do not check if billed separately Has the patient been seen at the hospital within the last three years: Yes Total Score: 90 Level Of Care: New/Established - Level 3 Electronic Signature(s) Signed:  09/05/2017 3:52:40 PM By: Alric Quan Entered By: Alric Quan on 09/05/2017 10:12:22 Raymond [name] ([id number]) -------------------------------------------------------------------------------- Lower Extremity Assessment Details Patient Name: Raymond [name]. Date of Service: 09/05/2017 8:00 AM Medical Record Number: [id number] Patient Account Number: [id number] Date of Birth/Sex: 04-09-1965 (52 y.o. Male) Treating RN: Secundino Ginger Primary Care Mackenzy Eisenberg: [name] Other Clinician: Referring Liba Hulsey: [name] Treating Kabrina Christiano/Extender: Cathie Olden in Treatment: 0 Edema Assessment Assessed: [Left: No] [Right: No] Edema: [Left: No] [Right: No] Calf Left: Right: Point of Measurement: 36 cm From Medial Instep 46 cm 50.5 cm Ankle Left: Right: Point of Measurement: 12 cm From Medial Instep 30.2 cm 29.2 cm Vascular Assessment Claudication: Claudication Assessment [Left:None] [Right:None] Pulses: Dorsalis Pedis Palpable: [Left:Yes] [Right:Yes] Doppler Audible: [Left:Yes] [Right:Yes] Posterior Tibial Palpable: [Left:No] [Right:No] Doppler Audible: [Left:Yes] [Right:Inaudible] Extremity colors, hair growth, and conditions: Extremity Color: [Left:Normal] [Right:Normal] Hair Growth on Extremity: [Left:No] [Right:No] Temperature of Extremity: [Left:Cool] [Right:Cool] Capillary Refill: [Left:> 3 seconds] [Right:> 3 seconds] Blood Pressure: Brachial: [Left:130] [Right:100] Dorsalis Pedis: 152 [Left:Dorsalis Pedis: 130] Ankle: Posterior Tibial: 220 [Left:Posterior Tibial: 1.69] [Right:1.00] Toe Nail Assessment Left:  Right: Thick: Yes Yes Discolored: Yes Yes Deformed: Yes Yes Improper Length and Hygiene: No No Electronic Signature(s) Signed: 09/05/2017 11:39:26 AM By: Konrad Penta ([id number]) Entered By: Secundino Ginger on 09/05/2017 08:59:15 Raymond [name] ([id number]) -------------------------------------------------------------------------------- Multi Wound Chart Details Patient Name: Raymond [name]. Date of Service: 09/05/2017 8:00 AM Medical Record Number: [id number] Patient Account Number: [id number] Date of Birth/Sex: 1965/07/21 (52 y.o. Male) Treating RN: Ahmed Prima Primary Care Kindra Bickham: [name] Other Clinician: Referring Samuel Rittenhouse: [name] Treating Tymia Streb/Extender: Cathie Olden in Treatment: 0 Vital Signs Height(in): 74 Pulse(bpm): 42 Weight(lbs): 381 Blood Pressure(mmHg): 126/57 Body Mass Index(BMI): 49 Temperature(F): 98.3 Respiratory Rate 16 (breaths/min): Photos: [1:No Photos] [N/A:N/A] Wound Location: [1:Left Metatarsal head first - Distal] [N/A:N/A] Wounding Event: [1:Gradually Appeared] [N/A:N/A] Primary Etiology: [1:Diabetic Wound/Ulcer of the Lower Extremity] [N/A:N/A] Comorbid History: [1:Type II Diabetes] [N/A:N/A] Date Acquired: [1:07/11/2017] [N/A:N/A] Weeks of Treatment: [1:0] [N/A:N/A] Wound Status: [1:Open] [N/A:N/A] Measurements L x W x D [1:1x0.9x0.2] [N/A:N/A] (cm) Area (cm) : [1:0.707] [N/A:N/A] Volume (cm) : [1:0.141] [N/A:N/A] Position 1 (o'clock): [1:2] Maximum Distance 1 (cm): [1:0.9] Tunneling: [1:Yes] [N/A:N/A] Classification: [1:Grade 2] [N/A:N/A] Exudate Amount: [1:None Present] [N/A:N/A] Wound Margin: [1:Indistinct, nonvisible] [N/A:N/A] Granulation Amount: [1:Medium (34-66%)] [N/A:N/A] Granulation Quality: [1:Red] [N/A:N/A] Necrotic Amount: [1:Medium (34-66%)] [N/A:N/A] Exposed Structures: [1:Fat Layer (Subcutaneous Tissue) Exposed: Yes Fascia: No Tendon: No Muscle: No Joint: No  Bone: No] [N/A:N/A] Debridement: [1:Debridement - Excisional] [N/A:N/A] Pre-procedure [1:09:08] [N/A:N/A] Verification/Time Out Taken: Pain Control: [1:Lidocaine 4% Topical Solution] [N/A:N/A] Tissue Debrided: [1:Callus, Subcutaneous, Slough] [N/A:N/A] Level: [1:Skin/Subcutaneous Tissue] [N/A:N/A] Debridement Area (sq cm): [1:0.6] [N/A:N/A] Instrument: Curette N/A N/A Bleeding: Minimum N/A N/A Hemostasis Achieved: Pressure N/A N/A Procedural Pain: 0 N/A N/A Post Procedural Pain: 0 N/A N/A Debridement Treatment Procedure was tolerated well N/A N/A Response: Post Debridement 1.1x0.7x0.3 N/A N/A Measurements L x W x D (cm) Post Debridement Volume: 0.181 N/A N/A (cm) Periwound Skin Texture: Callus: Yes N/A N/A Excoriation: No Induration: No Crepitus: No Rash: No Scarring: No Periwound Skin Moisture: Maceration: No N/A N/A Dry/Scaly: No Periwound Skin Color: Hemosiderin Staining: Yes N/A N/A Atrophie Blanche: No Cyanosis: No Ecchymosis: No Erythema: No Mottled: No Pallor: No Rubor: No Temperature: No Abnormality N/A N/A Tenderness on Palpation: No N/A N/A Wound Preparation: Ulcer Cleansing: N/A N/A Rinsed/Irrigated with Saline Topical Anesthetic Applied: Other: lidocain 4% Procedures Performed: Debridement N/A N/A Treatment Notes  Electronic Signature(s) Signed: 09/05/2017 9:18:48 AM By: Lawanda Cousins Entered By: Lawanda Cousins on 09/05/2017 09:18:48 Raymond [name] ([id number]) -------------------------------------------------------------------------------- [place] Details Patient Name: Raymond [name]. Date of Service: 09/05/2017 8:00 AM Medical Record Number: [id number] Patient Account Number: [id number] Date of Birth/Sex: [birth year] (52 y.o. Male) Treating RN: Ahmed Prima Primary Care Charish Schroepfer: [name] Other Clinician: Referring Alter Moss: [name] Treating Devyn Sheerin/Extender: Cathie Olden in Treatment:  0 Active Inactive ` Abuse / Safety / Falls / Self Care Management Nursing Diagnoses: Potential for falls Goals: Patient will not experience any injury related to falls Date Initiated: 09/05/2017 Target Resolution Date: 12/09/2017 Goal Status: Active Interventions: Assess fall risk on admission and as needed Assess: immobility, friction, shearing, incontinence upon admission and as needed Assess impairment of mobility on admission and as needed per policy Assess personal safety and home safety (as indicated) on admission and as needed Assess self care needs on admission and as needed Notes: ` Nutrition Nursing Diagnoses: Imbalanced nutrition Impaired glucose control: actual or potential Potential for alteratiion in Nutrition/Potential for imbalanced nutrition Goals: Patient/caregiver agrees to and verbalizes understanding of need to use nutritional supplements and/or vitamins as prescribed Date Initiated: 09/05/2017 Target Resolution Date: 01/06/2018 Goal Status: Active Patient/caregiver will maintain therapeutic glucose control Date Initiated: 09/05/2017 Target Resolution Date: 01/06/2018 Goal Status: Active Interventions: Assess patient nutrition upon admission and as needed per policy Provide education on elevated blood sugars and impact on wound healing Provide education on nutrition Notes: Raymond [name], Raymond [name] ([id number]) Orientation to the Wound Care Program Nursing Diagnoses: Knowledge deficit related to the wound healing center program Goals: Patient/caregiver will verbalize understanding of the [facility] Date Initiated: 09/05/2017 Target Resolution Date: 10/07/2017 Goal Status: Active Interventions: Provide education on orientation to the wound center Notes: ` Wound/Skin Impairment Nursing Diagnoses: Impaired tissue integrity Knowledge deficit related to ulceration/compromised skin integrity Goals: Ulcer/skin breakdown will have a volume  reduction of 80% by week 12 Date Initiated: 09/05/2017 Target Resolution Date: 01/06/2018 Goal Status: Active Interventions: Assess patient/caregiver ability to perform ulcer/skin care regimen upon admission and as needed Assess ulceration(s) every visit Notes: Electronic Signature(s) Signed: 09/05/2017 3:52:40 PM By: Alric Quan Entered By: Alric Quan on 09/05/2017 09:08:28 Raymond [name] ([id number]) -------------------------------------------------------------------------------- Pain Assessment Details Patient Name: Raymond [name]. Date of Service: 09/05/2017 8:00 AM Medical Record Number: [id number] Patient Account Number: [id number] Date of Birth/Sex: 28-Sep-1965 (52 y.o. Male) Treating RN: Secundino Ginger Primary Care Lilybeth Vien: [name] Other Clinician: Referring Samanthajo Payano: [name] Treating Torre Pikus/Extender: Cathie Olden in Treatment: 0 Active Problems Location of Pain Severity and Description of Pain Patient Has Paino No Site Locations Pain Management and Medication Current Pain Management: Goals for Pain Management pt denies pain Electronic Signature(s) Signed: 09/05/2017 11:39:26 AM By: Secundino Ginger Entered By: Secundino Ginger on 09/05/2017 08:22:08 Raymond [name] ([id number]) -------------------------------------------------------------------------------- Wound Assessment Details Patient Name: Raymond [name]. Date of Service: 09/05/2017 8:00 AM Medical Record Number: [id number] Patient Account Number: [id number] Date of Birth/Sex: [birth year] (52 y.o. Male) Treating RN: Secundino Ginger Primary Care Bridger Pizzi: [name] Other Clinician: Referring Shanterica Biehler: [name] Treating Rogina Schiano/Extender: Cathie Olden in Treatment: 0 Wound Status Wound Number: 1 Primary Etiology: Diabetic Wound/Ulcer of the Lower Extremity Wound Location: Left Metatarsal head first - Distal Wound Status: Open Wounding Event: Gradually  Appeared Comorbid Type II Diabetes Date Acquired: 07/11/2017 History: Weeks Of Treatment: 0 Clustered Wound: No Photos Photo Uploaded By: Secundino Ginger on 09/05/2017 09:43:47 Wound Measurements Length: (cm) 1 %  Redu Width: (cm) 0.9 % Redu Depth: (cm) 0.2 Tunnel Area: (cm) 0.707 Po Volume: (cm) 0.141 Ma ction in Area: ction in Volume: ing: Yes sition (o'clock): 2 ximum Distance: (cm) 0.9 Wound Description Classification: Grade 2 Foul O Wound Margin: Indistinct, nonvisible Slough Exudate Amount: None Present dor After Cleansing: No /Fibrino No Wound Bed Granulation Amount: Medium (34-66%) Exposed Structure Granulation Quality: Red Fascia Exposed: No Necrotic Amount: Medium (34-66%) Fat Layer (Subcutaneous Tissue) Exposed: Yes Necrotic Quality: Adherent Slough Tendon Exposed: No Muscle Exposed: No Joint Exposed: No Bone Exposed: No Periwound Skin Texture Texture Color Raymond [name], Raymond [name] ([id number]) No Abnormalities Noted: No No Abnormalities Noted: No Callus: Yes Atrophie Blanche: No Crepitus: No Cyanosis: No Excoriation: No Ecchymosis: No Induration: No Erythema: No Rash: No Hemosiderin Staining: Yes Scarring: No Mottled: No Pallor: No Moisture Rubor: No No Abnormalities Noted: No Dry / Scaly: No Temperature / Pain Maceration: No Temperature: No Abnormality Wound Preparation Ulcer Cleansing: Rinsed/Irrigated with Saline Topical Anesthetic Applied: Other: lidocain 4%, Electronic Signature(s) Signed: 09/05/2017 11:39:26 AM By: Secundino Ginger Entered By: Secundino Ginger on 09/05/2017 08:55:23 Raymond [name] ([id number]) -------------------------------------------------------------------------------- Vitals Details Patient Name: Raymond [name]. Date of Service: 09/05/2017 8:00 AM Medical Record Number: [id number] Patient Account Number: [id number] Date of Birth/Sex: 1965/09/08 (52 y.o. Male) Treating RN: Secundino Ginger Primary Care Carlus Stay: [name] Other Clinician: Referring Toneka Fullen: [name] Treating Tylerjames Hoglund/Extender: Cathie Olden in Treatment: 0 Vital Signs Time Taken: 08:20 Temperature (F): 98.3 Height (in): 74 Pulse (bpm): 73 Source: Stated Respiratory Rate (breaths/min): 16 Weight (lbs): 381 Blood Pressure (mmHg): 126/57 Source: Measured Reference Range: 80 - 120 mg / dl Body Mass Index (BMI): 48.9 Electronic Signature(s) Signed: 09/05/2017 11:39:26 AM By: Secundino Ginger Entered By: Secundino Ginger on 09/05/2017 08:47:40

## 2017-09-19 ENCOUNTER — Encounter: Payer: Medicare PPO | Admitting: Physician Assistant

## 2017-09-19 DIAGNOSIS — E11621 Type 2 diabetes mellitus with foot ulcer: Secondary | ICD-10-CM | POA: Diagnosis not present

## 2017-09-22 ENCOUNTER — Ambulatory Visit: Payer: Medicare PPO | Admitting: Family

## 2017-09-24 NOTE — Progress Notes (Signed)
Raymond [name], Raymond [name] ([id number]) Visit Report for 09/12/2017 Arrival Information Details Patient Name: Raymond [name], Raymond [name]. Date of Service: 09/12/2017 3:15 PM Medical Record Number: [id number] Patient Account Number: [id number] Date of Birth/Sex: 05/27/1965 (52 y.o. M) Treating RN: Secundino Ginger Primary Care Roman Dubuc: [name] Other Clinician: Referring Garett Tetzloff: [name] Treating Aubriel Khanna/Extender: Cathie Olden in Treatment: 1 Visit Information History Since Last Visit Added or deleted any medications: No Patient Arrived: Ambulatory Any new allergies or adverse reactions: No Arrival Time: 15:41 Had a fall or experienced change in No Accompanied By: grandson activities of daily living that may affect Transfer Assistance: None risk of falls: Patient Identification Verified: Yes Signs or symptoms of abuse/neglect since last visito No Secondary Verification Process Completed: Yes Hospitalized since last visit: No Implantable device outside of the clinic excluding No cellular tissue based products placed in the center since last visit: Has Dressing in Place as Prescribed: Yes Pain Present Now: No Electronic Signature(s) Signed: 09/12/2017 4:00:36 PM By: Secundino Ginger Entered By: Secundino Ginger on 09/12/2017 15:42:14 Raymond [name] ([id number]) -------------------------------------------------------------------------------- Encounter Discharge Information Details Patient Name: Raymond [name]. Date of Service: 09/12/2017 3:15 PM Medical Record Number: [id number] Patient Account Number: [id number] Date of Birth/Sex: 1965/09/18 (52 y.o. M) Treating RN: Montey Hora Primary Care Bingham Millette: [name] Other Clinician: Referring Gladies Sofranko: [name] Treating Veleria Barnhardt/Extender: Cathie Olden in Treatment: 1 Encounter Discharge Information Items Discharge Condition: Stable Ambulatory Status: Ambulatory Discharge Destination:  Home Transportation: Private Auto Accompanied By: grandson Schedule Follow-up Appointment: Yes Clinical Summary of Care: Electronic Signature(s) Signed: 09/12/2017 4:45:54 PM By: Montey Hora Entered By: Montey Hora on 09/12/2017 16:45:54 Raymond [name] ([id number]) -------------------------------------------------------------------------------- Lower Extremity Assessment Details Patient Name: Raymond [name]. Date of Service: 09/12/2017 3:15 PM Medical Record Number: [id number] Patient Account Number: [id number] Date of Birth/Sex: 07-07-1965 (52 y.o. M) Treating RN: Secundino Ginger Primary Care Kemiah Booz: [name] Other Clinician: Referring Sherod Cisse: [name] Treating Gregery Walberg/Extender: Cathie Olden in Treatment: 1 Edema Assessment Assessed: [Left: No] [Right: No] Edema: [Left: No] [Right: No] Calf Left: Right: Point of Measurement: 36 cm From Medial Instep 44 cm 45 cm Ankle Left: Right: Point of Measurement: 12 cm From Medial Instep 31 cm 30.5 cm Vascular Assessment Claudication: Claudication Assessment [Left:None] [Right:None] Pulses: Dorsalis Pedis Palpable: [Left:Yes] [Right:Yes] Posterior Tibial Extremity colors, hair growth, and conditions: Extremity Color: [Left:Normal] Hair Growth on Extremity: [Left:No] Temperature of Extremity: [Left:Warm] [Right:Warm] Notes 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 Electronic Signature(s) Signed: 09/12/2017 4:00:36 PM By: Secundino Ginger Entered By: Secundino Ginger on 09/12/2017 15:59:36 Raymond [name] ([id number]) -------------------------------------------------------------------------------- Multi Wound Chart Details Patient Name: Raymond [name]. Date of Service: 09/12/2017 3:15 PM Medical Record Number: [id number] Patient Account Number: [id number] Date of Birth/Sex: 1965-07-26 (52 y.o. M) Treating RN: Cornell Barman Primary Care Isaah Furry: [name] Other  Clinician: Referring Marlos Carmen: [name] Treating Velicia Dejager/Extender: Cathie Olden in Treatment: 1 Vital Signs Height(in): 74 Pulse(bpm): 79 Weight(lbs): 381 Blood Pressure(mmHg): 136/58 Body Mass Index(BMI): 49 Temperature(F): 98.6 Respiratory Rate 18 (breaths/min): Photos: [N/A:N/A] Wound Location: Left Metatarsal head first - N/A N/A Distal Wounding Event: Gradually Appeared N/A N/A Primary Etiology: Diabetic Wound/Ulcer of the N/A N/A Lower Extremity Comorbid History: Type II Diabetes N/A N/A Date Acquired: 07/11/2017 N/A N/A Weeks of Treatment: 1 N/A N/A Wound Status: Open N/A N/A Measurements L x W x D 0.9x0.8x0.2 N/A N/A (cm) Area (cm) : 0.565 N/A N/A Volume (cm) : 0.113 N/A N/A % Reduction in Area: 20.10% N/A  N/A % Reduction in Volume: 19.90% N/A N/A Classification: Grade 2 N/A N/A Exudate Amount: None Present N/A N/A Wound Margin: Indistinct, nonvisible N/A N/A Granulation Amount: Medium (34-66%) N/A N/A Granulation Quality: Red N/A N/A Necrotic Amount: Medium (34-66%) N/A N/A Exposed Structures: Fat Layer (Subcutaneous N/A N/A Tissue) Exposed: Yes Fascia: No Tendon: No Muscle: No Joint: No Bone: No Periwound Skin Texture: N/A N/A Raymond [name], Raymond [name] ([id number]) Callus: Yes Excoriation: No Induration: No Crepitus: No Rash: No Scarring: No Periwound Skin Moisture: Maceration: No N/A N/A Dry/Scaly: No Periwound Skin Color: Hemosiderin Staining: Yes N/A N/A Atrophie Blanche: No Cyanosis: No Ecchymosis: No Erythema: No Mottled: No Pallor: No Rubor: No Temperature: No Abnormality N/A N/A Tenderness on Palpation: No N/A N/A Wound Preparation: Ulcer Cleansing: N/A N/A Rinsed/Irrigated with Saline Topical Anesthetic Applied: Other: lidocain 4% Treatment Notes Electronic Signature(s) Signed: 09/12/2017 6:04:06 PM By: Gretta Cool, BSN, RN, CWS, Kim RN, BSN Entered By: Gretta Cool, BSN, RN, CWS, Kim on 09/12/2017 16:08:36 Raymond [name] ([id number]) -------------------------------------------------------------------------------- [place] Details Patient Name: Raymond [name], Raymond [name]. Date of Service: 09/12/2017 3:15 PM Medical Record Number: [id number] Patient Account Number: [id number] Date of Birth/Sex: 17-Jul-1965 (52 y.o. M) Treating RN: Cornell Barman Primary Care Kiylee Thoreson: [name] Other Clinician: Referring Sedale Jenifer: [name] Treating Elivia Robotham/Extender: Cathie Olden in Treatment: 1 Active Inactive ` Abuse / Safety / Falls / Self Care Management Nursing Diagnoses: Potential for falls Goals: Patient will not experience any injury related to falls Date Initiated: 09/05/2017 Target Resolution Date: 12/09/2017 Goal Status: Active Interventions: Assess fall risk on admission and as needed Assess: immobility, friction, shearing, incontinence upon admission and as needed Assess impairment of mobility on admission and as needed per policy Assess personal safety and home safety (as indicated) on admission and as needed Assess self care needs on admission and as needed Notes: ` Nutrition Nursing Diagnoses: Imbalanced nutrition Impaired glucose control: actual or potential Potential for alteratiion in Nutrition/Potential for imbalanced nutrition Goals: Patient/caregiver agrees to and verbalizes understanding of need to use nutritional supplements and/or vitamins as prescribed Date Initiated: 09/05/2017 Target Resolution Date: 01/06/2018 Goal Status: Active Patient/caregiver will maintain therapeutic glucose control Date Initiated: 09/05/2017 Target Resolution Date: 01/06/2018 Goal Status: Active Interventions: Assess patient nutrition upon admission and as needed per policy Provide education on elevated blood sugars and impact on wound healing Provide education on nutrition Notes: Raymond [name], Raymond [name] ([id number]) Orientation to the Wound Care Program Nursing  Diagnoses: Knowledge deficit related to the wound healing center program Goals: Patient/caregiver will verbalize understanding of the [place] Date Initiated: 09/05/2017 Target Resolution Date: 10/07/2017 Goal Status: Active Interventions: Provide education on orientation to the wound center Notes: ` Wound/Skin Impairment Nursing Diagnoses: Impaired tissue integrity Knowledge deficit related to ulceration/compromised skin integrity Goals: Ulcer/skin breakdown will have a volume reduction of 80% by week 12 Date Initiated: 09/05/2017 Target Resolution Date: 01/06/2018 Goal Status: Active Interventions: Assess patient/caregiver ability to perform ulcer/skin care regimen upon admission and as needed Assess ulceration(s) every visit Notes: Electronic Signature(s) Signed: 09/12/2017 6:04:06 PM By: Gretta Cool, BSN, RN, CWS, Kim RN, BSN Entered By: Gretta Cool, BSN, RN, CWS, Kim on 09/12/2017 16:08:29 Raymond [name] ([id number]) -------------------------------------------------------------------------------- Pain Assessment Details Patient Name: Raymond [name]. Date of Service: 09/12/2017 3:15 PM Medical Record Number: [id number] Patient Account Number: [id number] Date of Birth/Sex: 1965-03-21 (51 y.o. M) Treating RN: Secundino Ginger Primary Care Wenonah Milo: [name] Other Clinician: Referring Tristian Bouska: [name] Treating Alyse Kathan/Extender: Cathie Olden in Treatment: 1 Active Problems  Location of Pain Severity and Description of Pain Patient Has Paino No Site Locations Pain Management and Medication Current Pain Management: Goals for Pain Management pt denies pain at this time. Electronic Signature(s) Signed: 09/12/2017 4:00:36 PM By: Secundino Ginger Entered By: Secundino Ginger on 09/12/2017 15:42:42 Raymond [name] ([id number]) -------------------------------------------------------------------------------- Patient/Caregiver Education Details Patient  Name: Raymond [name]. Date of Service: 09/12/2017 3:15 PM Medical Record Number: [id number] Patient Account Number: [id number] Date of Birth/Gender: [birth year] (52 y.o. M) Treating RN: Montey Hora Primary Care Physician: [name] Other Clinician: Referring Physician: [name] Treating Physician/Extender: Cathie Olden in Treatment: 1 Education Assessment Education Provided To: Caregiver Education Topics Provided Wound/Skin Impairment: Handouts: Other: wound care as ordered Methods: Demonstration, Explain/Verbal Responses: State content correctly Electronic Signature(s) Signed: 09/12/2017 4:48:53 PM By: Montey Hora Entered By: Montey Hora on 09/12/2017 16:46:22 Raymond [name] ([id number]) -------------------------------------------------------------------------------- Wound Assessment Details Patient Name: Raymond [name]. Date of Service: 09/12/2017 3:15 PM Medical Record Number: [id number] Patient Account Number: [id number] Date of Birth/Sex: 10-16-1965 (52 y.o. M) Treating RN: Secundino Ginger Primary Care Caleen Taaffe: [name] Other Clinician: Referring Carsten Carstarphen: [name] Treating Casie Sturgeon/Extender: Cathie Olden in Treatment: 1 Wound Status Wound Number: 1 Primary Etiology: Diabetic Wound/Ulcer of the Lower Extremity Wound Location: Left Metatarsal head first - Distal Wound Status: Open Wounding Event: Gradually Appeared Comorbid Type II Diabetes Date Acquired: 07/11/2017 History: Weeks Of Treatment: 1 Clustered Wound: No Photos Photo Uploaded By: Secundino Ginger on 09/12/2017 15:57:41 Wound Measurements Length: (cm) 0.9 Width: (cm) 0.8 Depth: (cm) 0.2 Area: (cm) 0.565 Volume: (cm) 0.113 % Reduction in Area: 20.1% % Reduction in Volume: 19.9% Tunneling: No Undermining: No Wound Description Classification: Grade 2 Wound Margin: Indistinct, nonvisible Exudate Amount: None Present Foul Odor After  Cleansing: No Slough/Fibrino No Wound Bed Granulation Amount: Medium (34-66%) Exposed Structure Granulation Quality: Red Fascia Exposed: No Necrotic Amount: Medium (34-66%) Fat Layer (Subcutaneous Tissue) Exposed: Yes Necrotic Quality: Adherent Slough Tendon Exposed: No Muscle Exposed: No Joint Exposed: No Bone Exposed: No Periwound Skin Texture Texture Color No Abnormalities Noted: No No Abnormalities Noted: No Raymond [name], Raymond [name] ([id number]) Callus: Yes Atrophie Blanche: No Crepitus: No Cyanosis: No Excoriation: No Ecchymosis: No Induration: No Erythema: No Rash: No Hemosiderin Staining: Yes Scarring: No Mottled: No Pallor: No Moisture Rubor: No No Abnormalities Noted: No Dry / Scaly: No Temperature / Pain Maceration: No Temperature: No Abnormality Wound Preparation Ulcer Cleansing: Rinsed/Irrigated with Saline Topical Anesthetic Applied: Other: lidocain 4%, Treatment Notes Wound #1 (Left, Distal Metatarsal head first) 1. Cleansed with: Clean wound with Normal Saline 2. Anesthetic Topical Lidocaine 4% cream to wound bed prior to debridement 4. Dressing Applied: Aquacel Prisma Ag 5. Secondary Dressing Applied Dry Gauze Kerlix/Conform 7. Secured with Tape Patient to wear own compression stockings Electronic Signature(s) Signed: 09/12/2017 4:00:36 PM By: Secundino Ginger Entered By: Secundino Ginger on 09/12/2017 15:49:13 Raymond [name] ([id number]) -------------------------------------------------------------------------------- [place] Details Patient Name: Raymond [name]. Date of Service: 09/12/2017 3:15 PM Medical Record Number: [id number] Patient Account Number: [id number] Date of Birth/Sex: 1965-06-01 (52 y.o. M) Treating RN: Secundino Ginger Primary Care Krysia Zahradnik: [name] Other Clinician: Referring Shanvi Moyd: [name] Treating Charlayne Vultaggio/Extender: Cathie Olden in Treatment: 1 Vital Signs Time Taken: 15:40 Temperature (F):  98.6 Height (in): 74 Pulse (bpm): 79 Weight (lbs): 381 Respiratory Rate (breaths/min): 18 Body Mass Index (BMI): 48.9 Blood Pressure (mmHg): 136/58 Reference Range: 80 - 120 mg / dl Electronic Signature(s) Signed: 09/12/2017 4:00:36 PM By: Secundino Ginger Entered By: Secundino Ginger on  09/12/2017 15:43:12 

## 2017-09-24 NOTE — Progress Notes (Signed)
DIONTRE, HARPS ([id number]) Visit Report for 09/12/2017 Chief Complaint Document Details Patient Name: Raymond [name], Raymond [name]. Date of Service: 09/12/2017 3:15 PM Medical Record Number: [id number] Patient Account Number: [id number] Date of Birth/Sex: 20-May-1965 (52 y.o. M) Treating RN: Ahmed Prima Primary Care Provider: [name] Other Clinician: Referring Provider: [name] Treating Provider/Extender: Cathie Olden in Treatment: 1 Information Obtained from: Patient Chief Complaint left foot Electronic Signature(s) Signed: 09/12/2017 4:26:30 PM By: Lawanda Cousins Entered By: Lawanda Cousins on 09/12/2017 16:26:30 Raymond [name] ([id number]) -------------------------------------------------------------------------------- Debridement Details Patient Name: Raymond [name]. Date of Service: 09/12/2017 3:15 PM Medical Record Number: [id number] Patient Account Number: [id number] Date of Birth/Sex: 1965-03-19 (52 y.o. M) Treating RN: Cornell Barman Primary Care Provider: [name] Other Clinician: Referring Provider: [name] Treating Provider/Extender: Cathie Olden in Treatment: 1 Debridement Performed for Wound #1 Left,Distal Metatarsal head first Assessment: Performed By: Physician Lawanda Cousins, NP Debridement Type: Debridement Severity of Tissue Pre Limited to breakdown of skin Debridement: Pre-procedure Verification/Time Yes - 16:08 Out Taken: Start Time: 16:08 Pain Control: Other : lidocaine 4% Total Area Debrided (L x W): 0.9 (cm) x 0.8 (cm) = 0.72 (cm) Tissue and other material Viable, Non-Viable, Callus, Subcutaneous debrided: Level: Skin/Subcutaneous Tissue Debridement Description: Excisional Instrument: Curette Bleeding: Moderate Hemostasis Achieved: Silver Nitrate End Time: 16:10 Procedural Pain: 1 Post Procedural Pain: 1 Response to Treatment: Procedure was tolerated well Level of Consciousness: Awake  and Alert Post Debridement Measurements of Total Wound Length: (cm) 0.9 Width: (cm) 0.8 Depth: (cm) 0.2 Volume: (cm) 0.113 Character of Wound/Ulcer Post Debridement: Improved Severity of Tissue Post Debridement: Fat layer exposed Post Procedure Diagnosis Same as Pre-procedure Electronic Signature(s) Signed: 09/12/2017 5:29:48 PM By: Lawanda Cousins Signed: 09/12/2017 6:04:06 PM By: Gretta Cool, BSN, RN, CWS, Kim RN, BSN Entered By: Gretta Cool, BSN, RN, CWS, Kim on 09/12/2017 [place] ([id number]) -------------------------------------------------------------------------------- HPI Details Patient Name: Raymond [name]. Date of Service: 09/12/2017 3:15 PM Medical Record Number: [id number] Patient Account Number: [id number] Date of Birth/Sex: 01-06-1966 (52 y.o. M) Treating RN: Ahmed Prima Primary Care Provider: [name] Other Clinician: Referring Provider: [name] Treating Provider/Extender: Cathie Olden in Treatment: 1 History of Present Illness HPI Description: 09/05/17-He is seeing an initial evaluation for a left plantar foot ulcer. He has a remote history of left great toe amputation. He states that 4-6 weeks ago he noted callus formation and ulceration. He has not seen primary care regarding this. He is not currently on antibiotic therapy. He does not routinely follow with podiatry. He states diabetic foot wear will arrive early next week. The EHR shows an A1c of 9% approximate 4 months ago but he states he had one and primary care a few weeks ago but does not know the results. He is neuropathic and does not complain of any pain, he is currently wearing crocs. 09/12/17-he is here in follow up evaluation for left plantar foot ulcer. There is improvement in both appearance and measurement. We will continue with same treatment plan and he will follow up next week. Electronic Signature(s) Signed: 09/12/2017 4:27:18 PM By: Lawanda Cousins Entered  By: Lawanda Cousins on 09/12/2017 16:27:18 Raymond [name] ([id number]) -------------------------------------------------------------------------------- Physician Orders Details Patient Name: Raymond [name]. Date of Service: 09/12/2017 3:15 PM Medical Record Number: [id number] Patient Account Number: [id number] Date of Birth/Sex: [birth year] (52 y.o. M) Treating RN: Cornell Barman Primary Care Provider: [name] Other Clinician: Referring Provider: [name] Treating Provider/Extender: Cathie Olden in Treatment: 1 Verbal / Phone  Orders: No Diagnosis Coding Wound Cleansing Wound #1 Left,Distal Metatarsal head first o Clean wound with Normal Saline. o Cleanse wound with mild soap and water Anesthetic (add to Medication List) Wound #1 Left,Distal Metatarsal head first o Topical Lidocaine 4% cream applied to wound bed prior to debridement (In Clinic Only). Primary Wound Dressing Wound #1 Left,Distal Metatarsal head first o Alginate - place over the silver collagen o Silver Collagen - lightly moisten with saline (place in wound first) Secondary Dressing Wound #1 Left,Distal Metatarsal head first o Dry Gauze Dressing Change Frequency Wound #1 Left,Distal Metatarsal head first o Change dressing every other day. o Other: - as needed Follow-up Appointments Wound #1 Left,Distal Metatarsal head first o Return Appointment in 1 week. Edema Control Wound #1 Left,Distal Metatarsal head first o Elevate legs to the level of the heart and pump ankles as often as possible Additional Orders / Instructions Wound #1 Left,Distal Metatarsal head first o Increase protein intake. Electronic Signature(s) Signed: 09/12/2017 5:29:48 PM By: Lawanda Cousins Signed: 09/12/2017 6:04:06 PM By: Gretta Cool, BSN, RN, CWS, Kim RN, BSN Entered By: Gretta Cool, BSN, RN, CWS, Kim on 09/12/2017 16:11:00 YOSKAR, MURRILLO ([id number]) ISAC, LINCKS  ([id number]) -------------------------------------------------------------------------------- Problem List Details Patient Name: Raymond [name]. Date of Service: 09/12/2017 3:15 PM Medical Record Number: [id number] Patient Account Number: [id number] Date of Birth/Sex: 08-09-1965 (53 y.o. 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 Date of Service: 09/12/2017 3:15 PM Medical Record Number: [id number] Patient Account Number: [id number] Date of Birth/Sex: 06-Feb-1965 (52 y.o. M) Treating RN: Ahmed Prima Primary Care Provider: [name] Other Clinician: Referring Provider: [name] Treating Provider/Extender: Cathie Olden in Treatment: 1 Subjective Chief Complaint Information obtained from Patient left foot History of Present Illness (HPI) 09/05/17-He is seeing an initial evaluation for a left plantar foot ulcer. He has a remote history of left great toe amputation. He states that 4-6 weeks ago he noted callus formation and ulceration. He has not seen primary care regarding this. He is not currently on antibiotic therapy. He does not routinely follow with podiatry. He states diabetic foot wear will arrive early next week. The EHR shows  an A1c of 9% approximate 4 months ago but he states he had one and primary care a few weeks ago but does not know the results. He is neuropathic and does not complain of any pain, he is currently wearing crocs. 09/12/17-he is here in follow up evaluation for left plantar foot ulcer. There is improvement in both appearance and measurement. We will continue with same treatment plan and he will follow up next week. Objective Constitutional Vitals Time Taken: 3:40 PM, Height: 74 in, Weight: 381 lbs, BMI: 48.9, Temperature: 98.6 F, Pulse: 79 bpm, Respiratory Rate: 18 breaths/min, Blood Pressure: 136/58 mmHg. Integumentary (Hair, Skin) Wound #1 status is Open. Original cause of wound was Gradually Appeared. The wound is located on the Left,Distal Metatarsal head first. The wound measures 0.9cm length x 0.8cm width x 0.2cm depth; 0.565cm^2 area  and 0.113cm^3 volume. There is Fat Layer (Subcutaneous Tissue) Exposed exposed. There is no tunneling or undermining noted. There is a none present amount of drainage noted. The wound margin is indistinct and nonvisible. There is medium (34-66%) red granulation within the wound bed. There is a medium (34-66%) amount of necrotic tissue within the wound bed including Adherent Slough. The periwound skin appearance exhibited: Callus, Hemosiderin Staining. The periwound skin appearance did not exhibit: Crepitus, Excoriation, Induration, Rash, Scarring, Dry/Scaly, Maceration, Atrophie Blanche, Cyanosis, Ecchymosis, Mottled, Pallor, Rubor, Erythema. Periwound temperature was noted as No Abnormality. 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 There was a Excisional Skin/Subcutaneous Tissue Debridement with a total area of 0.72 sq cm performed by Lawanda Cousins, NP. With the following instrument(s): Curette to remove Viable and Non-Viable tissue/material. Material removed includes Callus and Subcutaneous Tissue and after achieving pain control using Other (lidocaine 4%). No specimens were taken. A time out was conducted at 16:08, prior to the start of the procedure. A Moderate amount of bleeding was controlled with Silver Nitrate. The procedure was tolerated well with a pain level of 1 throughout and a pain level of 1 following the procedure. Patient s Level of Consciousness post procedure was recorded as Awake and Alert. Post Debridement Measurements: 0.9cm length x 0.8cm width x 0.2cm depth; 0.113cm^3 volume. Character of Wound/Ulcer Post Debridement is improved. Severity of Tissue Post Debridement is: Fat layer exposed. Post procedure Diagnosis Wound #1: Same as Pre-Procedure Plan Wound Cleansing: Wound #1 Left,Distal Metatarsal head first: Clean wound with Normal Saline. Cleanse wound with mild soap and water Anesthetic (add to Medication List): Wound #1 Left,Distal Metatarsal head first: Topical Lidocaine 4% cream applied to wound bed prior to debridement (In Clinic Only). Primary Wound Dressing: Wound #1 Left,Distal Metatarsal head first: Alginate - place over the silver collagen Silver Collagen - lightly moisten with saline (place in wound first) Secondary Dressing: Wound #1 Left,Distal Metatarsal head first: Dry Gauze Dressing Change Frequency: Wound #1 Left,Distal Metatarsal head first: Change dressing every other day. Other: - as needed Follow-up Appointments: Wound #1 Left,Distal Metatarsal head first: Return Appointment in 1 week. Edema Control: Wound #1 Left,Distal Metatarsal head first: Elevate legs to the level of the heart and pump ankles as often as  possible Additional Orders / Instructions: Wound #1 Left,Distal Metatarsal head first: Increase protein intake. CASIMIR, BARCELLOS ([id number]) Electronic Signature(s) Signed: 09/12/2017 4:27:50 PM By: Lawanda Cousins Entered By: Lawanda Cousins on 09/12/2017 16:27:49 Raymond [name] ([id number]) -------------------------------------------------------------------------------- SuperBill Details Patient Name: Raymond [name]. Date of Service: 09/12/2017 Medical Record Number: [id number] Patient Account Number: [id number] Date of Birth/Sex: September 14, 1965 (52 y.o. 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

## 2017-09-25 ENCOUNTER — Ambulatory Visit: Payer: Medicare PPO | Admitting: Family

## 2017-09-25 ENCOUNTER — Telehealth: Payer: Self-pay | Admitting: Family

## 2017-09-25 NOTE — Telephone Encounter (Signed)
Patient did not show for his [facility] appointment on 09/25/17. Will attempt to reschedule.  

## 2017-09-26 ENCOUNTER — Encounter: Payer: Medicare PPO | Admitting: Physician Assistant

## 2017-09-26 DIAGNOSIS — E11621 Type 2 diabetes mellitus with foot ulcer: Secondary | ICD-10-CM | POA: Diagnosis not present

## 2017-09-29 NOTE — Progress Notes (Signed)
MUAAZ, BRAU ([id number]) Visit Report for 09/19/2017 Arrival Information Details Patient Name: Raymond [name], Raymond [name]. Date of Service: 09/19/2017 8:30 AM Medical Record Number: [id number] Patient Account Number: [id number] Date of Birth/Sex: Nov 26, 1965 (52 y.o. M) Treating RN: Montey Hora Primary Care Jenalee Trevizo: [name] Other Clinician: Referring Elijahjames Fuelling: [name] Treating Azha Constantin/Extender: Melburn Hake, HOYT Weeks in Treatment: 2 Visit Information History Since Last Visit Added or deleted any medications: No Patient Arrived: Ambulatory Any new allergies or adverse reactions: No Arrival Time: 08:29 Had a fall or experienced change in No Accompanied By: self activities of daily living that may affect Transfer Assistance: None risk of falls: Patient Identification Verified: Yes Signs or symptoms of abuse/neglect since last visito No Secondary Verification Process Completed: Yes Hospitalized since last visit: No Implantable device outside of the clinic excluding No cellular tissue based products placed in the center since last visit: Has Dressing in Place as Prescribed: Yes Pain Present Now: No Electronic Signature(s) Signed: 09/19/2017 5:07:08 PM By: Montey Hora Entered By: Montey Hora on 09/19/2017 08:29:53 Raymond [name] ([id number]) -------------------------------------------------------------------------------- Encounter Discharge Information Details Patient Name: Raymond [name]. Date of Service: 09/19/2017 8:30 AM Medical Record Number: [id number] Patient Account Number: [id number] Date of Birth/Sex: 1965-10-31 (52 y.o. M) Treating RN: Roger Shelter Primary Care Pati Thinnes: [name] Other Clinician: Referring Kentley Blyden: [name] Treating Yasmin Dibello/Extender: Melburn Hake, HOYT Weeks in Treatment: 2 Encounter Discharge Information Items Discharge Condition: Stable Ambulatory Status: Ambulatory Discharge Destination:  Home Transportation: Private Auto Schedule Follow-up Appointment: Yes Clinical Summary of Care: Electronic Signature(s) Signed: 09/19/2017 4:53:55 PM By: Roger Shelter Entered By: Roger Shelter on 09/19/2017 09:02:54 Raymond [name] ([id number]) -------------------------------------------------------------------------------- Lower Extremity Assessment Details Patient Name: Raymond [name]. Date of Service: 09/19/2017 8:30 AM Medical Record Number: [id number] Patient Account Number: [id number] Date of Birth/Sex: 1965/02/07 (52 y.o. M) Treating RN: Montey Hora Primary Care Bolton Canupp: [name] Other Clinician: Referring Atanacio Melnyk: [name] Treating Judson Tsan/Extender: STONE III, HOYT Weeks in Treatment: 2 Vascular Assessment Pulses: Dorsalis Pedis Palpable: [Left:Yes] Posterior Tibial Extremity colors, hair growth, and conditions: Extremity Color: [Left:Normal] Hair Growth on Extremity: [Left:Yes] Temperature of Extremity: [Left:Warm] Capillary Refill: [Left:< 3 seconds] Toe Nail Assessment Left: Right: Thick: Yes Discolored: Yes Deformed: Yes Improper Length and Hygiene: Yes Electronic Signature(s) Signed: 09/19/2017 5:07:08 PM By: Montey Hora Entered By: Montey Hora on 09/19/2017 08:37:04 Raymond [name] ([id number]) -------------------------------------------------------------------------------- Multi Wound Chart Details Patient Name: Raymond [name]. Date of Service: 09/19/2017 8:30 AM Medical Record Number: [id number] Patient Account Number: [id number] Date of Birth/Sex: [birth year] (52 y.o. M) Treating RN: Ahmed Prima Primary Care Earlie Arciga: [name] Other Clinician: Referring Leni Pankonin: [name] Treating Lylie Blacklock/Extender: STONE III, HOYT Weeks in Treatment: 2 Vital Signs Height(in): 74 Pulse(bpm): 73 Weight(lbs): 381 Blood Pressure(mmHg): 132/55 Body Mass Index(BMI): 49 Temperature(F):  98.4 Respiratory Rate 20 (breaths/min): Photos: [1:No Photos] [N/A:N/A] Wound Location: [1:Left Metatarsal head first - Distal] [N/A:N/A] Wounding Event: [1:Gradually Appeared] [N/A:N/A] Primary Etiology: [1:Diabetic Wound/Ulcer of the Lower Extremity] [N/A:N/A] Comorbid History: [1:Type II Diabetes] [N/A:N/A] Date Acquired: [1:07/11/2017] [N/A:N/A] Weeks of Treatment: [1:2] [N/A:N/A] Wound Status: [1:Open] [N/A:N/A] Measurements L x W x D [1:0.6x0.4x0.1] [N/A:N/A] (cm) Area (cm) : [1:0.188] [N/A:N/A] Volume (cm) : [1:0.019] [N/A:N/A] % Reduction in Area: [1:73.40%] [N/A:N/A] % Reduction in Volume: [1:86.50%] [N/A:N/A] Classification: [1:Grade 2] [N/A:N/A] Exudate Amount: [1:Medium] [N/A:N/A] Exudate Type: [1:Serous] [N/A:N/A] Exudate Color: [1:amber] [N/A:N/A] Wound Margin: [1:Indistinct, nonvisible] [N/A:N/A] Granulation Amount: [1:Medium (34-66%)] [N/A:N/A] Granulation Quality: [1:Red] [N/A:N/A] Necrotic Amount: [1:Medium (34-66%)] [N/A:N/A] Exposed Structures: [1:Fat Layer (Subcutaneous  Tissue) Exposed: Yes Fascia: No Tendon: No Muscle: No Joint: No Bone: No] [N/A:N/A] Epithelialization: [1:None] [N/A:N/A] Periwound Skin Texture: [1:Callus: Yes Excoriation: No Induration: No Crepitus: No] [N/A:N/A] Rash: No Scarring: No Periwound Skin Moisture: Maceration: No N/A N/A Dry/Scaly: No Periwound Skin Color: Hemosiderin Staining: Yes N/A N/A Atrophie Blanche: No Cyanosis: No Ecchymosis: No Erythema: No Mottled: No Pallor: No Rubor: No Temperature: No Abnormality N/A N/A Tenderness on Palpation: No N/A N/A Wound Preparation: Ulcer Cleansing: N/A N/A Rinsed/Irrigated with Saline Topical Anesthetic Applied: Other: lidocaine 4% Treatment Notes Electronic Signature(s) Signed: 09/20/2017 4:43:18 PM By: Alric Quan Entered By: Alric Quan on 09/19/2017 08:44:06 Raymond [name]  ([id number]) -------------------------------------------------------------------------------- [place] Details Patient Name: Raymond [name]. Date of Service: 09/19/2017 8:30 AM Medical Record Number: [id number] Patient Account Number: [id number] Date of Birth/Sex: 09/27/1965 (52 y.o. M) Treating RN: Ahmed Prima Primary Care Kanishk Stroebel: [name] Other Clinician: Referring Gustin Zobrist: [name] Treating Sutton Plake/Extender: STONE III, HOYT Weeks in Treatment: 2 Active Inactive ` Abuse / Safety / Falls / Self Care Management Nursing Diagnoses: Potential for falls Goals: Patient will not experience any injury related to falls Date Initiated: 09/05/2017 Target Resolution Date: 12/09/2017 Goal Status: Active Interventions: Assess fall risk on admission and as needed Assess: immobility, friction, shearing, incontinence upon admission and as needed Assess impairment of mobility on admission and as needed per policy Assess personal safety and home safety (as indicated) on admission and as needed Assess self care needs on admission and as needed Notes: ` Nutrition Nursing Diagnoses: Imbalanced nutrition Impaired glucose control: actual or potential Potential for alteratiion in Nutrition/Potential for imbalanced nutrition Goals: Patient/caregiver agrees to and verbalizes understanding of need to use nutritional supplements and/or vitamins as prescribed Date Initiated: 09/05/2017 Target Resolution Date: 01/06/2018 Goal Status: Active Patient/caregiver will maintain therapeutic glucose control Date Initiated: 09/05/2017 Target Resolution Date: 01/06/2018 Goal Status: Active Interventions: Assess patient nutrition upon admission and as needed per policy Provide education on elevated blood sugars and impact on wound healing Provide education on nutrition Notes: Raymond [name], Raymond [name] ([id number]) Orientation to the Wound Care Program Nursing  Diagnoses: Knowledge deficit related to the wound healing center program Goals: Patient/caregiver will verbalize understanding of the [place] Date Initiated: 09/05/2017 Target Resolution Date: 10/07/2017 Goal Status: Active Interventions: Provide education on orientation to the wound center Notes: ` Wound/Skin Impairment Nursing Diagnoses: Impaired tissue integrity Knowledge deficit related to ulceration/compromised skin integrity Goals: Ulcer/skin breakdown will have a volume reduction of 80% by week 12 Date Initiated: 09/05/2017 Target Resolution Date: 01/06/2018 Goal Status: Active Interventions: Assess patient/caregiver ability to perform ulcer/skin care regimen upon admission and as needed Assess ulceration(s) every visit Notes: Electronic Signature(s) Signed: 09/20/2017 4:43:18 PM By: Alric Quan Entered By: Alric Quan on 09/19/2017 08:43:59 Raymond [name] ([id number]) -------------------------------------------------------------------------------- Pain Assessment Details Patient Name: Raymond [name]. Date of Service: 09/19/2017 8:30 AM Medical Record Number: [id number] Patient Account Number: [id number] Date of Birth/Sex: Jun 10, 1965 (52 y.o. M) Treating RN: Montey Hora Primary Care Noe Pittsley: [name] Other Clinician: Referring Branden Shallenberger: [name] Treating Godwin Tedesco/Extender: Melburn Hake, HOYT Weeks in Treatment: 2 Active Problems Location of Pain Severity and Description of Pain Patient Has Paino No Site Locations Pain Management and Medication Current Pain Management: Electronic Signature(s) Signed: 09/19/2017 5:07:08 PM By: Montey Hora Entered By: Montey Hora on 09/19/2017 08:31:12 Raymond [name] ([id number]) -------------------------------------------------------------------------------- Patient/Caregiver Education Details Patient Name: Raymond [name]. Date of Service: 09/19/2017 8:30  AM Medical Record Number: [id number] Patient Account Number:  [id number] Date of Birth/Gender: [birth year] (52 y.o. M) Treating RN: Roger Shelter Primary Care Physician: [name] Other Clinician: Referring Physician: [name] Treating Physician/Extender: Sharalyn Ink in Treatment: 2 Education Assessment Education Provided To: Patient Education Topics Provided Wound Debridement: Handouts: Wound Debridement Methods: Explain/Verbal Responses: State content correctly Wound/Skin Impairment: Handouts: Caring for Your Ulcer Methods: Explain/Verbal Responses: State content correctly Electronic Signature(s) Signed: 09/19/2017 4:53:55 PM By: Roger Shelter Entered By: Roger Shelter on 09/19/2017 09:03:17 Raymond [name] ([id number]) -------------------------------------------------------------------------------- Wound Assessment Details Patient Name: Raymond [name]. Date of Service: 09/19/2017 8:30 AM Medical Record Number: [id number] Patient Account Number: [id number] Date of Birth/Sex: [birth year] (52 y.o. M) Treating RN: Montey Hora Primary Care Joelee Snoke: [name] Other Clinician: Referring Dekari Bures: [name] Treating Kayslee Furey/Extender: STONE III, HOYT Weeks in Treatment: 2 Wound Status Wound Number: 1 Primary Etiology: Diabetic Wound/Ulcer of the Lower Extremity Wound Location: Left Metatarsal head first - Distal Wound Status: Open Wounding Event: Gradually Appeared Comorbid Type II Diabetes Date Acquired: 07/11/2017 History: Weeks Of Treatment: 2 Clustered Wound: No Photos Photo Uploaded By: Montey Hora on 09/19/2017 11:27:37 Wound Measurements Length: (cm) 0.6 Width: (cm) 0.4 Depth: (cm) 0.1 Area: (cm) 0.188 Volume: (cm) 0.019 % Reduction in Area: 73.4% % Reduction in Volume: 86.5% Epithelialization: None Tunneling: No Undermining: No Wound Description Classification: Grade 2 Wound Margin:  Indistinct, nonvisible Exudate Amount: Medium Exudate Type: Serous Exudate Color: amber Foul Odor After Cleansing: No Slough/Fibrino No Wound Bed Granulation Amount: Medium (34-66%) Exposed Structure Granulation Quality: Red Fascia Exposed: No Necrotic Amount: Medium (34-66%) Fat Layer (Subcutaneous Tissue) Exposed: Yes Necrotic Quality: Adherent Slough Tendon Exposed: No Muscle Exposed: No Joint Exposed: No Bone Exposed: No Periwound Skin Texture Raymond [name], Raymond [name] ([id number]) Texture Color No Abnormalities Noted: No No Abnormalities Noted: No Callus: Yes Atrophie Blanche: No Crepitus: No Cyanosis: No Excoriation: No Ecchymosis: No Induration: No Erythema: No Rash: No Hemosiderin Staining: Yes Scarring: No Mottled: No Pallor: No Moisture Rubor: No No Abnormalities Noted: No Dry / Scaly: No Temperature / Pain Maceration: No Temperature: No Abnormality Wound Preparation Ulcer Cleansing: Rinsed/Irrigated with Saline Topical Anesthetic Applied: Other: lidocaine 4%, Treatment Notes Wound #1 (Left, Distal Metatarsal head first) 1. Cleansed with: Clean wound with Normal Saline 2. Anesthetic Topical Lidocaine 4% cream to wound bed prior to debridement 4. Dressing Applied: Prisma Ag 5. Secondary Dressing Applied Dry Gauze Kerlix/Conform Notes aquacel over prisma Electronic Signature(s) Signed: 09/19/2017 5:07:08 PM By: Montey Hora Entered By: Montey Hora on 09/19/2017 08:36:23 Raymond [name] ([id number]) -------------------------------------------------------------------------------- [place] Details Patient Name: Raymond [name]. Date of Service: 09/19/2017 8:30 AM Medical Record Number: [id number] Patient Account Number: [id number] Date of Birth/Sex: 1965/11/21 (52 y.o. M) Treating RN: Montey Hora Primary Care Ruby Logiudice: [name] Other Clinician: Referring Tyvion Edmondson: [name] Treating Olivia Royse/Extender: STONE III,  HOYT Weeks in Treatment: 2 Vital Signs Time Taken: 08:31 Temperature (F): 98.4 Height (in): 74 Pulse (bpm): 73 Weight (lbs): 381 Respiratory Rate (breaths/min): 20 Body Mass Index (BMI): 48.9 Blood Pressure (mmHg): 132/55 Reference Range: 80 - 120 mg / dl Electronic Signature(s) Signed: 09/19/2017 5:07:08 PM By: Montey Hora Entered By: Montey Hora on 09/19/2017 08:32:33

## 2017-09-29 NOTE — Progress Notes (Signed)
JACORIAN, GOLASZEWSKI ([id number]) Visit Report for 09/19/2017 Chief Complaint Document Details Patient Name: Raymond [name], Raymond [name]. Date of Service: 09/19/2017 8:30 AM Medical Record Number: [id number] Patient Account Number: [id number] Date of Birth/Sex: 15-Apr-1965 (52 y.o. M) Treating RN: Ahmed Prima Primary Care Provider: [name] Other Clinician: Referring Provider: [name] Treating Provider/Extender: Melburn Hake, Syon Tews Weeks in Treatment: 2 Information Obtained from: Patient Chief Complaint left foot Electronic Signature(s) Signed: 09/20/2017 12:11:53 PM By: [name] PA-C Entered By: [name] on 09/19/2017 08:39:36 Raymond [name] ([id number]) -------------------------------------------------------------------------------- Debridement Details Patient Name: Raymond [name]. Date of Service: 09/19/2017 8:30 AM Medical Record Number: [id number] Patient Account Number: [id number] Date of Birth/Sex: September 16, 1965 (52 y.o. M) Treating RN: Ahmed Prima Primary Care Provider: [name] Other Clinician: Referring Provider: [name] Treating Provider/Extender: STONE III, Eloyse Causey Weeks in Treatment: 2 Debridement Performed for Wound #1 Left,Distal Metatarsal head first Assessment: Performed By: Physician STONE III, Koya Hunger E., PA-C Debridement Type: Debridement Severity of Tissue Pre Fat layer exposed Debridement: Pre-procedure Verification/Time Yes - 08:46 Out Taken: Start Time: 08:46 Pain Control: Other : lidocaine 4% Total Area Debrided (L x W): 0.6 (cm) x 0.4 (cm) = 0.24 (cm) Tissue and other material Viable, Non-Viable, Callus, Slough, Subcutaneous, Fibrin/Exudate, Slough debrided: Level: Skin/Subcutaneous Tissue Debridement Description: Excisional Instrument: Curette Bleeding: Minimum Hemostasis Achieved: Pressure End Time: 08:49 Procedural Pain: 0 Post Procedural Pain: 0 Response to Treatment: Procedure was  tolerated well Level of Consciousness: Awake and Alert Post Debridement Measurements of Total Wound Length: (cm) 0.6 Width: (cm) 0.4 Depth: (cm) 0.2 Volume: (cm) 0.038 Character of Wound/Ulcer Post Debridement: Stable Severity of Tissue Post Debridement: Fat layer exposed Post Procedure Diagnosis Same as Pre-procedure Electronic Signature(s) Signed: 09/19/2017 8:52:36 AM By: Alric Quan Signed: 09/20/2017 12:11:53 PM By: [name] PA-C Entered By: Alric Quan on 09/19/2017 08:52:36 Raymond [name] ([id number]) -------------------------------------------------------------------------------- HPI Details Patient Name: Raymond [name]. Date of Service: 09/19/2017 8:30 AM Medical Record Number: [id number] Patient Account Number: [id number] Date of Birth/Sex: [birth year] (52 y.o. M) Treating RN: Ahmed Prima Primary Care Provider: [name] Other Clinician: Referring Provider: [name] Treating Provider/Extender: Melburn Hake, Merrik Puebla Weeks in Treatment: 2 History of Present Illness HPI Description: 09/05/17-He is seeing an initial evaluation for a left plantar foot ulcer. He has a remote history of left great toe amputation. He states that 4-6 weeks ago he noted callus formation and ulceration. He has not seen primary care regarding this. He is not currently on antibiotic therapy. He does not routinely follow with podiatry. He states diabetic foot wear will arrive early next week. The EHR shows an A1c of 9% approximate 4 months ago but he states he had one and primary care a few weeks ago but does not know the results. He is neuropathic and does not complain of any pain, he is currently wearing crocs. 09/12/17-he is here in follow up evaluation for left plantar foot ulcer. There is improvement in both appearance and measurement. We will continue with same treatment plan and he will follow up next week. 09/19/17 on evaluation today patient actually appears  to be doing excellent in regard to his ulcer on the invitation site of his left great foot plantar aspect. He's been tolerating the dressing changes without complication. In fact with the Prisma and the current measures he has been shown signs of excellent improvement week by week up to this point. We have been to breeding the wound in this seems to have been of great  benefit for him. Fortunately there is no evidence of infection. Electronic Signature(s) Signed: 09/20/2017 12:11:53 PM By: [name] PA-C Entered By: [name] on 09/19/2017 08:51:30 Raymond [name] ([id number]) -------------------------------------------------------------------------------- Physical Exam Details Patient Name: Raymond [name]. Date of Service: 09/19/2017 8:30 AM Medical Record Number: [id number] Patient Account Number: [id number] Date of Birth/Sex: 1965-05-22 (52 y.o. M) Treating RN: Ahmed Prima Primary Care Provider: [name] Other Clinician: Referring Provider: [name] Treating Provider/Extender: STONE III, Marsela Kuan Weeks in Treatment: 2 Constitutional Well-nourished and well-hydrated in no acute distress. Respiratory normal breathing without difficulty. Psychiatric this patient is able to make decisions and demonstrates good insight into disease process. Alert and Oriented x 3. pleasant and cooperative. Notes Patient's wound bed shows some Slough noted on the surface of the wound that was also some callous surrounding that did require sharp debridement today. Patient tolerated debridement at this point without complication post debridement the wound bed appears to be doing excellent. Electronic Signature(s) Signed: 09/20/2017 12:11:53 PM By: [name] PA-C Entered By: [name] on 09/19/2017 08:52:09 Raymond [name] ([id number]) -------------------------------------------------------------------------------- Physician Orders Details Patient  Name: Raymond [name]. Date of Service: 09/19/2017 8:30 AM Medical Record Number: [id number] Patient Account Number: [id number] Date of Birth/Sex: [birth year] (52 y.o. 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 o Cleanse wound with mild soap and water Anesthetic (add to Medication List) Wound #1 Left,Distal Metatarsal head first o Topical Lidocaine 4% cream applied to wound bed prior to debridement (In Clinic Only). Primary Wound Dressing Wound #1 Left,Distal Metatarsal head first o Alginate - place over the silver collagen o Silver Collagen - lightly moisten with saline (place in wound first) Secondary Dressing Wound #1 Left,Distal Metatarsal head first o Dry Gauze o Conform/Kerlix Dressing Change Frequency Wound #1 Left,Distal Metatarsal head first o Change dressing every other day. o Other: - as needed Follow-up Appointments Wound #1 Left,Distal Metatarsal head first o Return Appointment in 1 week. Edema Control Wound #1 Left,Distal Metatarsal head first o Elevate legs to the level of the heart and pump ankles as often as possible Additional Orders / Instructions Wound #1 Left,Distal Metatarsal head first o Increase protein intake. ABOU, STERKEL ([id number]) Patient Medications Allergies: No Known Allergies Notifications Medication Indication Start End lidocaine DOSE 1 - topical 4 % cream - 1 cream topical Electronic  Signature(s) Signed: 09/20/2017 12:11:53 PM By: [name] PA-C Signed: 09/20/2017 4:43:18 PM By: Alric Quan Entered By: Alric Quan on 09/19/2017 08:48:25 Raymond [name] ([id number]) -------------------------------------------------------------------------------- Prescription 09/19/2017 Patient Name: Raymond [name]. Provider: [name] PA-C Date of Birth: 02/18/1965 NPI#: [id number] Sex: [name] DEA#: [id number] Phone #: [phone] License #: Patient Address: [place] [address] [place], [place] [address] [address] [place], [place] [address] [phone] Allergies No Known Allergies Medication Medication: Route: Strength: Form: lidocaine 4 % topical cream topical 4% cream Class: TOPICAL LOCAL ANESTHETICS Dose: Frequency / Time: Indication: 1 1 cream topical Number of Refills: Number of Units:  0 Generic Substitution: Start Date: End Date: One Time Use: Substitution Permitted No Note to Pharmacy: Signature(s): Date(s): Electronic Signature(s) Signed: 09/20/2017 12:11:53 PM By: [name] PA-C Signed: 09/20/2017 4:43:18 PM By: Alric Quan Entered By: Alric Quan on 09/19/2017 08:48:26 Raymond [name] ([id number]) --------------------------------------------------------------------------------  Problem List Details Patient Name: Raymond [name]. Date of Service: 09/19/2017 8:30 AM Medical Record Number: [id number] Patient Account Number: [id number] Date of Birth/Sex: 04/30/1965 (52 y.o. M) Treating RN: Ahmed Prima Primary Care Provider: [name] Other Clinician: Referring Provider: [name] Treating Provider/Extender: Melburn Hake, Lesley Atkin Weeks in Treatment: 2 Active Problems ICD-10 Evaluated Encounter Code Description Active Date Today Diagnosis L97.522 Non-pressure chronic ulcer of other part of  left foot with fat 09/05/2017 No Yes layer exposed E11.621 Type 2 diabetes mellitus with foot ulcer 09/05/2017 No Yes Inactive Problems Resolved Problems Electronic Signature(s) Signed: 09/20/2017 12:11:53 PM By: [name] PA-C Entered By: [name] on 09/19/2017 08:39:24 Raymond [name] ([id number]) -------------------------------------------------------------------------------- Progress Note Details Patient Name: Raymond [name]. Date of Service: 09/19/2017 8:30 AM Medical Record Number: [id number] Patient Account Number: [id number] Date of Birth/Sex: 13-Feb-1965 (52 y.o. M) Treating RN: Ahmed Prima Primary Care Provider: [name] Other Clinician: Referring Provider: [name] Treating Provider/Extender: Melburn Hake, Fitzroy Mikami Weeks in Treatment: 2 Subjective Chief Complaint Information obtained from Patient left foot History of Present Illness (HPI) 09/05/17-He is seeing an initial evaluation for a left plantar foot ulcer. He has a remote history of left great toe amputation. He states that 4-6 weeks ago he noted callus formation and ulceration. He has not seen primary care regarding this. He is not currently on antibiotic therapy. He does not routinely follow with podiatry. He states diabetic foot wear will arrive early next week. The EHR shows an A1c of 9% approximate 4 months ago but he states he had one and primary care a few weeks ago but does not know the results. He is neuropathic and does not complain of any pain, he is currently wearing crocs. 09/12/17-he is here in follow up evaluation for left plantar foot ulcer. There is improvement in both appearance and measurement. We will continue with same treatment plan and he will follow up next week. 09/19/17 on evaluation today patient actually appears to be doing excellent in regard to his ulcer on the invitation site of his left great foot plantar aspect. He's been tolerating the dressing changes  without complication. In fact with the Prisma and the current measures he has been shown signs of excellent improvement week by week up to this point. We have been to breeding the wound in this seems to have been of great benefit for him. Fortunately there is no evidence of infection. Patient History Information obtained from Patient. Family History Diabetes - Mother,Maternal Grandparents, Hypertension - Father, Stroke - Father, No family history of Cancer, Heart Disease, Hereditary Spherocytosis, Kidney Disease, Lung Disease, Seizures, Thyroid Problems, Tuberculosis. Social History Never smoker, Marital Status - Divorced, Alcohol Use - Rarely, Drug Use - No History, Caffeine Use - Never. Review of Systems (ROS) Constitutional Symptoms (General Health) Denies complaints or symptoms of Fever, Chills. Respiratory The patient has no complaints or symptoms. Cardiovascular The patient has no complaints or symptoms. Psychiatric The patient has no complaints or symptoms. ASAEL, PANN ([id number]) Objective Constitutional Well-nourished and well-hydrated in no acute distress. Vitals Time Taken: 8:31 AM, Height: 74 in, Weight: 381 lbs, BMI: 48.9, Temperature: 98.4 F, Pulse: 73 bpm, Respiratory Rate: 20 breaths/min, Blood Pressure:  132/55 mmHg. Respiratory normal breathing without difficulty. Psychiatric this patient is able to make decisions and demonstrates good insight into disease process. Alert and Oriented x 3. pleasant and cooperative. General Notes: Patient's wound bed shows some Slough noted on the surface of the wound that was also some callous surrounding that did require sharp debridement today. Patient tolerated debridement at this point without complication post debridement the wound bed appears to be doing excellent. Integumentary (Hair, Skin) Wound #1 status is Open. Original cause of wound was Gradually Appeared. The wound is located on the  Left,Distal Metatarsal head first. The wound measures 0.6cm length x 0.4cm width x 0.1cm depth; 0.188cm^2 area and 0.019cm^3 volume. There is Fat Layer (Subcutaneous Tissue) Exposed exposed. There is no tunneling or undermining noted. There is a medium amount of serous drainage noted. The wound margin is indistinct and nonvisible. There is medium (34-66%) red granulation within the wound bed. There is a medium (34-66%) amount of necrotic tissue within the wound bed including Adherent Slough. The periwound skin appearance exhibited: Callus, Hemosiderin Staining. The periwound skin appearance did not exhibit: Crepitus, Excoriation, Induration, Rash, Scarring, Dry/Scaly, Maceration, Atrophie Blanche, Cyanosis, Ecchymosis, Mottled, Pallor, Rubor, Erythema. Periwound temperature was noted as No Abnormality. 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 Cleanse wound with mild soap and water Anesthetic (add to Medication List): Wound #1 Left,Distal Metatarsal head first: TYLIEK, TIMBERMAN ([id number]) Topical Lidocaine 4% cream applied to wound bed prior to debridement (In Clinic Only). Primary Wound Dressing: Wound #1 Left,Distal Metatarsal head first: Alginate - place over the silver collagen Silver Collagen - lightly moisten with saline (place in wound first) Secondary Dressing: Wound #1 Left,Distal Metatarsal head first: Dry Gauze Conform/Kerlix Dressing Change Frequency: Wound #1 Left,Distal Metatarsal head first: Change dressing every other day. Other: - as needed Follow-up Appointments: Wound #1 Left,Distal Metatarsal head first: Return Appointment in 1 week. Edema Control: Wound #1 Left,Distal Metatarsal head first: Elevate legs to the level of the heart and pump ankles  as often as possible Additional Orders / Instructions: Wound #1 Left,Distal Metatarsal head first: Increase protein intake. The following medication(s) was prescribed: lidocaine topical 4 % cream 1 1 cream topical was prescribed at facility Patient's wound bed shows some Slough noted on the surface of the wound that was also some callous surrounding that did require sharp debridement today. Patient tolerated debridement at this point without complication post debridement the wound bed appears to be doing excellent. My suggestion at this point is going to be that we go ahead and continue with the above wound care orders since he seems to be making great progress. With that being said if he anything changes and we plateau then I may want to initiate something along the lines of a total contact cast or minimum a more appropriate offloading shoe. With that being said right now he's making such good progress and not even sure that's necessary. We will see were things stand at follow-up. Please see above for specific wound care orders. We will see patient for re-evaluation in 1 week(s) here in the clinic. If anything worsens or changes patient will contact our office for additional recommendations. Electronic Signature(s) Signed: 09/20/2017 12:11:53 PM By: [name] PA-C Entered By: [name] on 09/19/2017 08:52:42 Raymond [name] ([id number]) -------------------------------------------------------------------------------- ROS/PFSH Details Patient  Name: Raymond [name]. Date of Service: 09/19/2017 8:30 AM Medical Record Number: [id number] Patient Account Number: [id number] Date of Birth/Sex: 11/15/1965 (52 y.o. M) Treating RN: Ahmed Prima Primary Care Provider: [name] Other Clinician: Referring Provider: [name] Treating Provider/Extender: STONE III, Gloriann Riede Weeks in Treatment: 2 Information Obtained From Patient Wound History Do you currently have  one or more open woundso Yes How many open wounds do you currently haveo 1 Approximately how long have you had your woundso 3 weeks How have you been treating your wound(s) until nowo clened and bandaid Has your wound(s) ever healed and then re-openedo No Have you had any lab work done in the past montho No Have you tested positive for an antibiotic resistant organism (MRSA, VRE)o No Have you tested positive for osteomyelitis (bone infection)o No Have you had any tests for circulation on your legso No Constitutional Symptoms (General Health) Complaints and Symptoms: Negative for: Fever; Chills Respiratory Complaints and Symptoms: No Complaints or Symptoms Cardiovascular Complaints and Symptoms: No Complaints or Symptoms Endocrine Medical History: Positive for: Type II Diabetes Negative for: Type I Diabetes Time with diabetes: 15 years Treated with: Insulin, Oral agents Blood sugar tested every day: Yes Tested : PRN Psychiatric Complaints and Symptoms: No Complaints or Symptoms Immunizations Pneumococcal Vaccine: Received Pneumococcal Vaccination: No Tetanus Vaccine: Last tetanus shot: 09/05/2016 Raymond [name] ([id number]) Implantable Devices Family and Social History Cancer: No; Diabetes: Yes - Mother,Maternal Grandparents; Heart Disease: No; Hereditary Spherocytosis: No; Hypertension: Yes - Father; Kidney Disease: No; Lung Disease: No; Seizures: No; Stroke: Yes - Father; Thyroid Problems: No; Tuberculosis: No; Never smoker; Marital Status - Divorced; Alcohol Use: Rarely; Drug Use: No History; Caffeine Use: Never; Financial Concerns: No; Food, Clothing or Shelter Needs: No; Support System Lacking: No; Transportation Concerns: No; Advanced Directives: Yes - glenda (Not Provided); Patient does not want information on Advanced Directives; Do not resuscitate: No Physician Affirmation I have reviewed and agree with the above information. Electronic Signature(s) Signed:  09/20/2017 12:11:53 PM By: [name] PA-C Signed: 09/20/2017 4:43:18 PM By: Alric Quan Entered By: [name] on 09/19/2017 08:51:48 Raymond [name] ([id number]) -------------------------------------------------------------------------------- SuperBill Details Patient Name: Raymond [name]. Date of Service: 09/19/2017 Medical Record Number: [id number] Patient Account Number: [id number] Date of Birth/Sex: [birth year] (52 y.o. M) Treating RN: Ahmed Prima Primary Care Provider: [name] Other Clinician: Referring Provider: [name] Treating Provider/Extender: Melburn Hake, Verda Mehta Weeks in Treatment: 2 Diagnosis Coding ICD-10 Codes Code Description [id number] Non-pressure chronic ulcer of other part of left foot with fat layer exposed E11.621 Type 2 diabetes mellitus with foot ulcer Facility Procedures CPT4 Code: [id number] Description: 02725 - DEB SUBQ TISSUE 20 SQ CM/< ICD-10 Diagnosis Description L97.522 Non-pressure chronic ulcer of other part of left foot with fat Modifier: layer exposed Quantity: 1 Physician Procedures CPT4 Code: 3664403 Description: 11042 - WC PHYS SUBQ TISS 20 SQ CM ICD-10 Diagnosis Description L97.522 Non-pressure chronic ulcer of other part of left foot with fat Modifier: layer exposed Quantity: 1 Electronic Signature(s) Signed: 09/20/2017 12:11:53 PM By: [name] PA-C Entered By: [name] on 09/19/2017 08:52:50

## 2017-10-03 ENCOUNTER — Encounter: Payer: Medicare PPO | Attending: Physician Assistant | Admitting: Physician Assistant

## 2017-10-03 DIAGNOSIS — E11621 Type 2 diabetes mellitus with foot ulcer: Secondary | ICD-10-CM | POA: Insufficient documentation

## 2017-10-03 DIAGNOSIS — L97522 Non-pressure chronic ulcer of other part of left foot with fat layer exposed: Secondary | ICD-10-CM | POA: Insufficient documentation

## 2017-10-03 DIAGNOSIS — A4901 Methicillin susceptible Staphylococcus aureus infection, unspecified site: Secondary | ICD-10-CM | POA: Diagnosis not present

## 2017-10-03 DIAGNOSIS — Z794 Long term (current) use of insulin: Secondary | ICD-10-CM | POA: Diagnosis not present

## 2017-10-05 ENCOUNTER — Encounter: Payer: Self-pay | Admitting: Podiatry

## 2017-10-05 ENCOUNTER — Ambulatory Visit: Payer: Medicare PPO | Admitting: Podiatry

## 2017-10-05 DIAGNOSIS — M79674 Pain in right toe(s): Secondary | ICD-10-CM

## 2017-10-05 DIAGNOSIS — L97521 Non-pressure chronic ulcer of other part of left foot limited to breakdown of skin: Secondary | ICD-10-CM

## 2017-10-05 DIAGNOSIS — M79675 Pain in left toe(s): Secondary | ICD-10-CM | POA: Diagnosis not present

## 2017-10-05 DIAGNOSIS — E11621 Type 2 diabetes mellitus with foot ulcer: Secondary | ICD-10-CM

## 2017-10-05 DIAGNOSIS — S98132S Complete traumatic amputation of one left lesser toe, sequela: Secondary | ICD-10-CM

## 2017-10-05 DIAGNOSIS — S98111S Complete traumatic amputation of right great toe, sequela: Secondary | ICD-10-CM

## 2017-10-05 DIAGNOSIS — B351 Tinea unguium: Secondary | ICD-10-CM

## 2017-10-05 DIAGNOSIS — E1142 Type 2 diabetes mellitus with diabetic polyneuropathy: Secondary | ICD-10-CM

## 2017-10-05 DIAGNOSIS — S98112S Complete traumatic amputation of left great toe, sequela: Secondary | ICD-10-CM

## 2017-10-05 NOTE — Progress Notes (Signed)
DJIMON, LUNDSTROM ([id number]) Visit Report for 10/03/2017 Chief Complaint Document Details Patient Name: Raymond [name], Raymond [name]. Date of Service: 10/03/2017 3:15 PM Medical Record Number: [id number] Patient Account Number: [id number] Date of Birth/Sex: 10/18/1965 (52 y.o. Male) Treating RN: Montey Hora Primary Care Provider: [name] Other Clinician: Referring Provider: [name] Treating Provider/Extender: Melburn Hake, HOYT Weeks in Treatment: 4 Information Obtained from: Patient Chief Complaint left foot Electronic Signature(s) Signed: 10/04/2017 11:11:19 AM By: [name] PA-C Entered By: [name] on 10/03/2017 15:44:05 Raymond [name] ([id number]) -------------------------------------------------------------------------------- Debridement Details Patient Name: Raymond [name]. Date of Service: 10/03/2017 3:15 PM Medical Record Number: [id number] Patient Account Number: [id number] Date of Birth/Sex: 1965/08/22 (52 y.o. Male) Treating RN: Montey Hora Primary Care Provider: [name] Other Clinician: Referring Provider: [name] Treating Provider/Extender: Melburn Hake, HOYT Weeks in Treatment: 4 Debridement Performed for Wound #1 Left,Distal Metatarsal head first Assessment: Performed By: Physician STONE III, HOYT E., PA-C Debridement Type: Debridement Severity of Tissue Pre Fat layer exposed Debridement: Pre-procedure Verification/Time Yes - 15:46 Out Taken: Start Time: 15:46 Pain Control: Lidocaine 4% Topical Solution Total Area Debrided (L x W): 0.2 (cm) x 0.2 (cm) = 0.04 (cm) Tissue and other material Viable, Non-Viable, Callus, Slough, Subcutaneous, Slough debrided: Level: Skin/Subcutaneous Tissue Debridement Description: Excisional Instrument: Curette Bleeding: Minimum Hemostasis Achieved: Pressure End Time: 15:49 Procedural Pain: 0 Post Procedural Pain: 0 Response to Treatment: Procedure was tolerated  well Level of Consciousness: Awake and Alert Post Debridement Measurements of Total Wound Length: (cm) 0.3 Width: (cm) 0.3 Depth: (cm) 0.1 Volume: (cm) 0.007 Character of Wound/Ulcer Post Debridement: Improved Severity of Tissue Post Debridement: Fat layer exposed Post Procedure Diagnosis Same as Pre-procedure Electronic Signature(s) Signed: 10/03/2017 5:51:08 PM By: Montey Hora Signed: 10/04/2017 11:11:19 AM By: [name] PA-C Entered By: Montey Hora on 10/03/2017 15:48:03 Raymond [name] ([id number]) -------------------------------------------------------------------------------- HPI Details Patient Name: Raymond [name]. Date of Service: 10/03/2017 3:15 PM Medical Record Number: [id number] Patient Account Number: [id number] Date of Birth/Sex: 14-Mar-1965 (52 y.o. Male) Treating RN: Montey Hora Primary Care Provider: [name] Other Clinician: Referring Provider: [name] Treating Provider/Extender: Melburn Hake, HOYT Weeks in Treatment: 4 History of Present Illness HPI Description: 09/05/17-He is seeing an initial evaluation for a left plantar foot ulcer. He has a remote history of left great toe amputation. He states that 4-6 weeks ago he noted callus formation and ulceration. He has not seen primary care regarding this. He is not currently on antibiotic therapy. He does not routinely follow with podiatry. He states diabetic foot wear will arrive early next week. The EHR shows an A1c of 9% approximate 4 months ago but he states he had one and primary care a few weeks ago but does not know the results. He is neuropathic and does not complain of any pain, he is currently wearing crocs. 09/12/17-he is here in follow up evaluation for left plantar foot ulcer. There is improvement in both appearance and measurement. We will continue with same treatment plan and he will follow up next week. 09/19/17 on evaluation today patient actually appears to be doing  excellent in regard to his ulcer on the invitation site of his left great foot plantar aspect. He's been tolerating the dressing changes without complication. In fact with the Prisma and the current measures he has been shown signs of excellent improvement week by week up to this point. We have been to breeding the wound in this seems to have been of great benefit  for him. Fortunately there is no evidence of infection. 09/26/17 on evaluation today patient appears to be doing rather well in regard to his wound. He did not know quite as much improvement this week as compared to last week. Nonetheless he still continues to show signs of improving to some degree. I do believe he may benefit from an offloading shoe. No fevers, chills, nausea, or vomiting noted at this time. 10/03/17 on evaluation today patient actually appears to be doing much better in regard to the amputation site plantar foot ulcer. Overall this appears significantly smaller even compared to previous. He's been tolerating the dressing changes without complication. He did get his diabetic shoes and I did have a look at them they appear to be fairly good. Nonetheless I do think that for the time being I would probably recommend he continue with the offloading shoe that we have been utilizing just due to the fact that with the dressing I don't know that his diabetic shoes are going to work as appropriately as far as not called an additional pressure and irritation to the area in question. He understands. Electronic Signature(s) Signed: 10/04/2017 11:11:19 AM By: [name] PA-C Entered By: [name] on 10/04/2017 11:08:46 Raymond [name] ([id number]) -------------------------------------------------------------------------------- Physical Exam Details Patient Name: Raymond [name]. Date of Service: 10/03/2017 3:15 PM Medical Record Number: [id number] Patient Account Number: [id number] Date of Birth/Sex: 19-Feb-1965  (52 y.o. Male) Treating RN: Montey Hora Primary Care Provider: [name] Other Clinician: Referring Provider: [name] Treating Provider/Extender: STONE III, HOYT Weeks in Treatment: 4 Constitutional Well-nourished and well-hydrated in no acute distress. Respiratory normal breathing without difficulty. Psychiatric this patient is able to make decisions and demonstrates good insight into disease process. Alert and Oriented x 3. pleasant and cooperative. Notes Patient's wound bed did have some callous noted around the surrounding of the wound as well as some Slough noted of the surface of the wound. Both were sharply debrided away today without complication and post debridement the wound bed appears to be doing significantly better which is good news. Electronic Signature(s) Signed: 10/04/2017 11:11:19 AM By: [name] PA-C Entered By: [name] on 10/04/2017 11:09:18 Raymond [name] ([id number]) -------------------------------------------------------------------------------- Physician Orders Details Patient Name: Raymond [name]. Date of Service: 10/03/2017 3:15 PM Medical Record Number: [id number] Patient Account Number: [id number] Date of Birth/Sex: [birth year] (52 y.o. 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 o Cleanse wound with mild soap and water Anesthetic (add to Medication List) Wound #1 Left,Distal Metatarsal head first o Topical Lidocaine 4% cream applied to wound bed prior to  debridement (In Clinic Only). Primary Wound Dressing Wound #1 Left,Distal Metatarsal head first o Silver Collagen - lightly moisten with saline (place in wound first) Secondary Dressing Wound #1 Left,Distal Metatarsal head first o Dry Gauze o Conform/Kerlix Dressing Change Frequency Wound #1 Left,Distal Metatarsal head first o Change dressing every other day. o Other: - as needed Follow-up Appointments Wound #1 Left,Distal Metatarsal head first o Return Appointment in 1 week. Edema Control Wound #1 Left,Distal Metatarsal head first o Elevate legs to the level of  the heart and pump ankles as often as possible Off-Loading Wound #1 Left,Distal Metatarsal head first o Open toe surgical shoe with peg assist. Raymond [name] ([id number]) Additional Orders / Instructions Wound #1 Left,Distal Metatarsal head first o Increase protein intake. Electronic Signature(s) Signed: 10/03/2017 5:51:08 PM By: Montey Hora Signed: 10/04/2017 11:11:19 AM By: [name] PA-C Entered By: Montey Hora on 10/03/2017 15:49:06 Raymond [name] ([id number]) -------------------------------------------------------------------------------- Problem List Details Patient Name: Raymond [name]. Date of Service: 10/03/2017 3:15 PM Medical Record Number: [id number] Patient Account Number: [id number] Date of Birth/Sex: 04-09-1965 (52 y.o. Male) Treating RN: Montey Hora Primary Care Provider: [name] Other Clinician: Referring Provider: [name] Treating Provider/Extender: Melburn Hake, HOYT Weeks in Treatment: 4 Active Problems ICD-10 Evaluated Encounter Code Description Active Date Today Diagnosis L97.522 Non-pressure chronic ulcer of other part of left foot with fat 09/05/2017 No Yes layer exposed E11.621 Type 2 diabetes mellitus with foot ulcer 09/05/2017 No Yes Inactive Problems Resolved Problems Electronic Signature(s) Signed: 10/04/2017 11:11:19 AM  By: [name] PA-C Entered By: [name] on 10/03/2017 15:43:50 Raymond [name] ([id number]) -------------------------------------------------------------------------------- Progress Note Details Patient Name: Raymond [name]. Date of Service: 10/03/2017 3:15 PM Medical Record Number: [id number] Patient Account Number: [id number] Date of Birth/Sex: [birth year] (52 y.o. Male) Treating RN: Montey Hora Primary Care Provider: [name] Other Clinician: Referring Provider: [name] Treating Provider/Extender: Melburn Hake, HOYT Weeks in Treatment: 4 Subjective Chief Complaint Information obtained from Patient left foot History of Present Illness (HPI) 09/05/17-He is seeing an initial evaluation for a left plantar foot ulcer. He has a remote history of left great toe amputation. He states that 4-6 weeks ago he noted callus formation and ulceration. He has not seen primary care regarding this. He is not currently on antibiotic therapy. He does not routinely follow with podiatry. He states diabetic foot wear will arrive early next week. The EHR shows an A1c of 9% approximate 4 months ago but he states he had one and primary care a few weeks ago but does not know the results. He is neuropathic and does not complain of any pain, he is currently wearing crocs. 09/12/17-he is here in follow up evaluation for left plantar foot ulcer. There is improvement in both appearance and measurement. We will continue with same treatment plan and he will follow up next week. 09/19/17 on evaluation today patient actually appears to be doing excellent in regard to his ulcer on the invitation site of his left great foot plantar aspect. He's been tolerating the dressing changes without complication. In fact with the Prisma and the current measures he has been shown signs of excellent improvement week by week up to this point. We have been to breeding the wound in this seems to have been  of great benefit for him. Fortunately there is no evidence of infection. 09/26/17 on evaluation today patient appears to be doing rather well in regard to his wound. He did not know quite as much improvement this week as compared to last week. Nonetheless he still continues to show signs of improving to some degree. I do believe he may benefit from an offloading shoe. No fevers, chills, nausea, or vomiting noted at this time. 10/03/17 on evaluation today patient actually appears to be doing much better in regard to the amputation site plantar foot ulcer. Overall this appears significantly smaller even compared to previous. He's been tolerating the dressing changes without complication. He did get his diabetic shoes and I did  have a look at them they appear to be fairly good. Nonetheless I do think that for the time being I would probably recommend he continue with the offloading shoe that we have been utilizing just due to the fact that with the dressing I don't know that his diabetic shoes are going to work as appropriately as far as not called an additional pressure and irritation to the area in question. He understands. Patient History Information obtained from Patient. Family History Diabetes - Mother,Maternal Grandparents, Hypertension - Father, Stroke - Father, No family history of Cancer, Heart Disease, Hereditary Spherocytosis, Kidney Disease, Lung Disease, Seizures, Thyroid Problems, Tuberculosis. Social History Never smoker, Marital Status - Divorced, Alcohol Use - Rarely, Drug Use - No History, Caffeine Use - Never. Review of Systems (ROS) Constitutional Symptoms (General Health) Denies complaints or symptoms of Fever, Chills. Respiratory The patient has no complaints or symptoms. Raymond [name], Raymond [name] ([id number]) Cardiovascular The patient has no complaints or symptoms. Psychiatric The patient has no complaints or symptoms. Objective Constitutional Well-nourished and  well-hydrated in no acute distress. Vitals Time Taken: 3:12 PM, Height: 74 in, Weight: 381 lbs, BMI: 48.9, Temperature: 98.7 F, Pulse: 74 bpm, Respiratory Rate: 18 breaths/min, Blood Pressure: 123/58 mmHg. Respiratory normal breathing without difficulty. Psychiatric this patient is able to make decisions and demonstrates good insight into disease process. Alert and Oriented x 3. pleasant and cooperative. General Notes: Patient's wound bed did have some callous noted around the surrounding of the wound as well as some Slough noted of the surface of the wound. Both were sharply debrided away today without complication and post debridement the wound bed appears to be doing significantly better which is good news. Integumentary (Hair, Skin) Wound #1 status is Open. Original cause of wound was Gradually Appeared. The wound is located on the Left,Distal Metatarsal head first. The wound measures 0.2cm length x 0.2cm width x 0.2cm depth; 0.031cm^2 area and 0.006cm^3 volume. There is Fat Layer (Subcutaneous Tissue) Exposed exposed. There is no tunneling or undermining noted. There is a medium amount of serous drainage noted. The wound margin is indistinct and nonvisible. There is small (1-33%) red granulation within the wound bed. There is a medium (34-66%) amount of necrotic tissue within the wound bed including Eschar. The periwound skin appearance exhibited: Callus, Maceration, Hemosiderin Staining. The periwound skin appearance did not exhibit: Crepitus, Excoriation, Induration, Rash, Scarring, Dry/Scaly, Atrophie Blanche, Cyanosis, Ecchymosis, Mottled, Pallor, Rubor, Erythema. Periwound temperature was noted as No Abnormality. 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 There was a Excisional Skin/Subcutaneous Tissue Debridement with a total area of 0.04 sq cm performed by STONE III, HOYT E., PA-C. With the following instrument(s): Curette to remove Viable and Non-Viable tissue/material. Material removed includes Callus, Subcutaneous Tissue, and Slough after achieving pain control using Lidocaine 4% Topical Solution. No specimens were taken. A time out was conducted at 15:46, prior to the start of the procedure. A Minimum amount of bleeding was controlled with Pressure. The procedure was tolerated well with a pain level of 0 throughout and a pain level of 0 following the procedure. Patient s Level of Consciousness post procedure was recorded as Awake and Alert. Post Debridement Measurements: 0.3cm  length x 0.3cm width x 0.1cm depth; 0.007cm^3 volume. Character of Wound/Ulcer Post Debridement is improved. Severity of Tissue Post Debridement is: Fat layer exposed. Post procedure Diagnosis Wound #1: Same as Pre-Procedure Plan Wound Cleansing: Wound #1 Left,Distal Metatarsal head first: Clean wound with Normal Saline. Cleanse wound with mild soap and water Anesthetic (add to Medication List): Wound #1 Left,Distal Metatarsal head first: Topical Lidocaine 4% cream applied to wound bed prior to debridement (In Clinic Only). Primary Wound Dressing: Wound #1 Left,Distal Metatarsal head first: Silver Collagen - lightly moisten with saline (place in wound first) Secondary Dressing: Wound #1 Left,Distal Metatarsal head first: Dry Gauze Conform/Kerlix Dressing Change Frequency: Wound #1 Left,Distal Metatarsal head first: Change dressing every other day. Other: - as needed Follow-up Appointments: Wound #1 Left,Distal Metatarsal head first: Return Appointment in 1 week. Edema Control: Wound #1 Left,Distal Metatarsal head  first: Elevate legs to the level of the heart and pump ankles as often as possible Off-Loading: Wound #1 Left,Distal Metatarsal head first: Open toe surgical shoe with peg assist. Additional Orders / Instructions: Wound #1 Left,Distal Metatarsal head first: Increase protein intake. Raymond [name], Raymond [name] ([id number]) Raymond [name] suggest at this point that we actually go ahead and continue with the Current wound care measures since things do seem to be getting better. Again I discussed the possibility of a total contact cast although right now I'm not even sure that it's necessary since he is showing signs of good improvement. If things tend to stall that we will consider that following. He's in agreement the plan. Please see above for specific wound care orders. We will see patient for re-evaluation in 1 week(s) here in the clinic. If anything worsens or changes patient will contact our office for additional recommendations. Electronic Signature(s) Signed: 10/04/2017 11:11:19 AM By: [name] PA-C Entered By: [name] on 10/04/2017 11:09:31 Raymond [name] ([id number]) -------------------------------------------------------------------------------- ROS/PFSH Details Patient Name: Raymond [name]. Date of Service: 10/03/2017 3:15 PM Medical Record Number: [id number] Patient Account Number: [id number] Date of Birth/Sex: Apr 16, 1965 (52 y.o. Male) Treating RN: Montey Hora Primary Care Provider: [name] Other Clinician: Referring Provider: [name] Treating Provider/Extender: Melburn Hake, HOYT Weeks in Treatment: 4 Information Obtained From Patient Wound History Do you currently have one or more open woundso Yes How many open wounds do you currently haveo 1 Approximately how long have you had your woundso 3 weeks How have you been treating your wound(s) until nowo clened and bandaid Has your wound(s) ever healed and then re-openedo No Have you had  any lab work done in the past montho No Have you tested positive for an antibiotic resistant organism (MRSA, VRE)o No Have you tested positive for osteomyelitis (bone infection)o No Have you had any tests for circulation on your legso No Constitutional Symptoms (General Health) Complaints and Symptoms: Negative for: Fever; Chills Respiratory Complaints and Symptoms: No Complaints or Symptoms Cardiovascular Complaints and Symptoms: No Complaints or Symptoms Endocrine Medical History: Positive for: Type II Diabetes Negative for: Type I Diabetes Time with diabetes: 15 years Treated with: Insulin, Oral agents Blood sugar tested every day: Yes Tested : PRN Psychiatric Complaints and Symptoms: No Complaints or Symptoms Immunizations Pneumococcal Vaccine: Received Pneumococcal Vaccination: No Tetanus Vaccine: Last tetanus shot: 09/05/2016 Raymond [name] ([id number]) Implantable Devices Family and Social History Cancer: No; Diabetes: Yes - Mother,Maternal Grandparents; Heart Disease: No; Hereditary Spherocytosis: No; Hypertension: Yes - Father; Kidney Disease: No; Lung Disease: No; Seizures: No; Stroke: Yes - Father; Thyroid Problems: No;  Tuberculosis: No; Never smoker; Marital Status - Divorced; Alcohol Use: Rarely; Drug Use: No History; Caffeine Use: Never; Financial Concerns: No; Food, Clothing or Shelter Needs: No; Support System Lacking: No; Transportation Concerns: No; Advanced Directives: Yes - glenda (Not Provided); Patient does not want information on Advanced Directives; Do not resuscitate: No Physician Affirmation I have reviewed and agree with the above information. Electronic Signature(s) Signed: 10/04/2017 11:11:19 AM By: [name] PA-C Signed: 10/04/2017 4:57:10 PM By: Montey Hora Entered By: [name] on 10/04/2017 11:09:05 Raymond [name]  ([id number]) -------------------------------------------------------------------------------- SuperBill Details Patient Name: Raymond [name]. Date of Service: 10/03/2017 Medical Record Number: [id number] Patient Account Number: [id number] Date of Birth/Sex: 1966-01-21 (52 y.o. Male) Treating RN: Montey Hora Primary Care Provider: [name] Other Clinician: Referring Provider: [name] Treating Provider/Extender: Melburn Hake, HOYT Weeks in Treatment: 4 Diagnosis Coding ICD-10 Codes Code Description [phone] Non-pressure chronic ulcer of other part of left foot with fat layer exposed E11.621 Type 2 diabetes mellitus with foot ulcer Facility Procedures CPT4 Code: [id number] Description: 02111 - DEB SUBQ TISSUE 20 SQ CM/< ICD-10 Diagnosis Description L97.522 Non-pressure chronic ulcer of other part of left foot with fat Modifier: layer exposed Quantity: 1 Physician Procedures CPT4 Code: 7356701 Description: 41030 - WC PHYS SUBQ TISS 20 SQ CM ICD-10 Diagnosis Description L97.522 Non-pressure chronic ulcer of other part of left foot with fat Modifier: layer exposed Quantity: 1 Electronic Signature(s) Signed: 10/04/2017 11:11:19 AM By: [name] PA-C Entered By: [name] on 10/04/2017 11:09:40

## 2017-10-05 NOTE — Progress Notes (Signed)
This patient  presents to the office with chief complaint of long thick painful nails.  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  He says his nails are painful due to the long thick nails on his remaining toes. He is unable to self treat.  He has been treated at the [facility] for an open wound/ulcer, sub-first metatarsal, left foot.  He says he is under treatment and has been prescribed diabetic shoes as well as a walking cast.  He presents the office today for an evaluation and treatment of his nails, but says that the other clinic is working on his ulcer.  General Appearance  Alert, conversant and in no acute stress.  Vascular  Dorsalis pedis and posterior tibial  pulses are weakly  palpable  bilaterally.  Capillary return is within normal limits  bilaterally. Temperature is within normal limits  Bilaterally. Swelling noted  B/L.  Neurologic  Senn-Weinstein monofilament wire test absent   bilaterally. Muscle power within normal limits bilaterally.  Nails Thick disfigured discolored nails with subungual debris  from 3,4,5 left and 2-5 right.. No evidence of bacterial infection or drainage bilaterally.  Orthopedic  No limitations of motion  feet .  No crepitus or effusions noted.  No bony pathology or digital deformities noted. Amputation noted hallux  B/Land second toe left foot.  Skin  normotropic skin with no porokeratosis noted bilaterally.  An open wound/ulcer sub first metatarsal left foot.  No redness or swelling or drainage noted.    Onychomycosis  B/L  Diabetes with neuropathy  Amputation  B/L  IE  Debride nails  X 7.  The ulcer was rebandaged and he was to follow up the [facility].  RTC 3 months for nail care.  Gardiner Barefoot DPM

## 2017-10-08 NOTE — Progress Notes (Signed)
KENDARIUS, VIGEN ([id number]) Visit Report for 10/03/2017 Arrival Information Details Patient Name: Raymond [name], Raymond [name]. Date of Service: 10/03/2017 3:15 PM Medical Record Number: [id number] Patient Account Number: [id number] Date of Birth/Sex: [birth year] (52 y.o. Male) Treating RN: Montey Hora Primary Care Daveion Robar: [name] Other Clinician: Referring Brynnan Rodenbaugh: [name] Treating Blondine Hottel/Extender: Melburn Hake, HOYT Weeks in Treatment: 4 Visit Information History Since Last Visit Added or deleted any medications: No Patient Arrived: Ambulatory Any new allergies or adverse reactions: No Arrival Time: 15:12 Had a fall or experienced change in No Accompanied By: self activities of daily living that may affect Transfer Assistance: None risk of falls: Patient Identification Verified: Yes Signs or symptoms of abuse/neglect since last visito No Secondary Verification Process Completed: Yes Hospitalized since last visit: No Patient Has Alerts: Yes Implantable device outside of the clinic excluding No Patient Alerts: DMII cellular tissue based products placed in the center since last visit: Has Dressing in Place as Prescribed: Yes Pain Present Now: No Electronic Signature(s) Signed: 10/05/2017 4:37:30 PM By: Lorine Bears RCP, RRT, CHT Entered By: Becky Sax, Amado Nash on 10/03/2017 15:12:44 Raymond [name] ([id number]) -------------------------------------------------------------------------------- Encounter Discharge Information Details Patient Name: Raymond [name]. Date of Service: 10/03/2017 3:15 PM Medical Record Number: [id number] Patient Account Number: [id number] Date of Birth/Sex: Oct 31, 1965 (52 y.o. Male) Treating RN: Cornell Barman Primary Care Tayden Nichelson: [name] Other Clinician: Referring Gilberta Peeters: [name] Treating Yee Joss/Extender: Melburn Hake, HOYT Weeks in Treatment: 4 Encounter Discharge Information  Items Discharge Condition: Stable Ambulatory Status: Ambulatory Discharge Destination: Home Transportation: Private Auto Accompanied By: self Schedule Follow-up Appointment: No Clinical Summary of Care: Electronic Signature(s) Signed: 10/03/2017 6:56:50 PM By: Gretta Cool, BSN, RN, CWS, Kim RN, BSN Entered By: Gretta Cool, BSN, RN, CWS, Kim on 10/03/2017 15:57:53 Raymond [name] ([id number]) -------------------------------------------------------------------------------- Lower Extremity Assessment Details Patient Name: Raymond [name]. Date of Service: 10/03/2017 3:15 PM Medical Record Number: [id number] Patient Account Number: [id number] Date of Birth/Sex: July 24, 1965 (52 y.o. Male) Treating RN: Secundino Ginger Primary Care Tymere Depuy: [name] Other Clinician: Referring Clarrissa Shimkus: [name] Treating Keagan Brislin/Extender: Melburn Hake, HOYT Weeks in Treatment: 4 Electronic Signature(s) Signed: 10/04/2017 9:53:02 AM By: Secundino Ginger Entered By: Secundino Ginger on 10/03/2017 15:23:57 Raymond [name] ([id number]) -------------------------------------------------------------------------------- Multi Wound Chart Details Patient Name: Raymond [name]. Date of Service: 10/03/2017 3:15 PM Medical Record Number: [id number] Patient Account Number: [id number] Date of Birth/Sex: 1965/06/25 (52 y.o. Male) Treating RN: Montey Hora Primary Care Jestina Stephani: [name] Other Clinician: Referring Christain Mcraney: [name] Treating Brand Siever/Extender: STONE III, HOYT Weeks in Treatment: 4 Vital Signs Height(in): 74 Pulse(bpm): 74 Weight(lbs): 381 Blood Pressure(mmHg): 123/58 Body Mass Index(BMI): 49 Temperature(F): 98.7 Respiratory Rate 18 (breaths/min): Photos: [N/A:N/A] Wound Location: Left Metatarsal head first - N/A N/A Distal Wounding Event: Gradually Appeared N/A N/A Primary Etiology: Diabetic Wound/Ulcer of the N/A N/A Lower Extremity Comorbid History: Type II Diabetes N/A  N/A Date Acquired: 07/11/2017 N/A N/A Weeks of Treatment: 4 N/A N/A Wound Status: Open N/A N/A Measurements L x W x D 0.2x0.2x0.2 N/A N/A (cm) Area (cm) : 0.031 N/A N/A Volume (cm) : 0.006 N/A N/A % Reduction in Area: 95.60% N/A N/A % Reduction in Volume: 95.70% N/A N/A Classification: Grade 2 N/A N/A Exudate Amount: Medium N/A N/A Exudate Type: Serous N/A N/A Exudate Color: amber N/A N/A Wound Margin: Indistinct, nonvisible N/A N/A Granulation Amount: Small (1-33%) N/A N/A Granulation Quality: Red N/A N/A Necrotic Amount: Medium (34-66%) N/A N/A Necrotic Tissue: Eschar N/A N/A Exposed Structures: Fat Layer (Subcutaneous N/A N/A  Tissue) Exposed: Yes Fascia: No Tendon: No Muscle: No Raymond [name], Raymond [name] ([id number]) Joint: No Bone: No Epithelialization: None N/A N/A Periwound Skin Texture: Callus: Yes N/A N/A Excoriation: No Induration: No Crepitus: No Rash: No Scarring: No Periwound Skin Moisture: Maceration: Yes N/A N/A Dry/Scaly: No Periwound Skin Color: Hemosiderin Staining: Yes N/A N/A Atrophie Blanche: No Cyanosis: No Ecchymosis: No Erythema: No Mottled: No Pallor: No Rubor: No Temperature: No Abnormality N/A N/A Tenderness on Palpation: No N/A N/A Wound Preparation: Ulcer Cleansing: N/A N/A Rinsed/Irrigated with Saline Topical Anesthetic Applied: Other: lidocaine 4% Treatment Notes Electronic Signature(s) Signed: 10/03/2017 5:51:08 PM By: Montey Hora Entered By: Montey Hora on 10/03/2017 15:45:39 Raymond [name] ([id number]) -------------------------------------------------------------------------------- [place] Details Patient Name: Raymond [name]. Date of Service: 10/03/2017 3:15 PM Medical Record Number: [id number] Patient Account Number: [id number] Date of Birth/Sex: July 24, 1965 (52 y.o. Male) Treating RN: Montey Hora Primary Care Diamante Rubin: [name] Other Clinician: Referring Deniqua Perry:  [name] Treating Dorcus Riga/Extender: Melburn Hake, HOYT Weeks in Treatment: 4 Active Inactive ` Abuse / Safety / Falls / Self Care Management Nursing Diagnoses: Potential for falls Goals: Patient will not experience any injury related to falls Date Initiated: 09/05/2017 Target Resolution Date: 12/09/2017 Goal Status: Active Interventions: Assess fall risk on admission and as needed Assess: immobility, friction, shearing, incontinence upon admission and as needed Assess impairment of mobility on admission and as needed per policy Assess personal safety and home safety (as indicated) on admission and as needed Assess self care needs on admission and as needed Notes: ` Nutrition Nursing Diagnoses: Imbalanced nutrition Impaired glucose control: actual or potential Potential for alteratiion in Nutrition/Potential for imbalanced nutrition Goals: Patient/caregiver agrees to and verbalizes understanding of need to use nutritional supplements and/or vitamins as prescribed Date Initiated: 09/05/2017 Target Resolution Date: 01/06/2018 Goal Status: Active Patient/caregiver will maintain therapeutic glucose control Date Initiated: 09/05/2017 Target Resolution Date: 01/06/2018 Goal Status: Active Interventions: Assess patient nutrition upon admission and as needed per policy Provide education on elevated blood sugars and impact on wound healing Provide education on nutrition Notes: Raymond [name], Raymond [name] ([id number]) Orientation to the Wound Care Program Nursing Diagnoses: Knowledge deficit related to the wound healing center program Goals: Patient/caregiver will verbalize understanding of the [facility] Date Initiated: 09/05/2017 Target Resolution Date: 10/07/2017 Goal Status: Active Interventions: Provide education on orientation to the wound center Notes: ` Wound/Skin Impairment Nursing Diagnoses: Impaired tissue integrity Knowledge deficit related to  ulceration/compromised skin integrity Goals: Ulcer/skin breakdown will have a volume reduction of 80% by week 12 Date Initiated: 09/05/2017 Target Resolution Date: 01/06/2018 Goal Status: Active Interventions: Assess patient/caregiver ability to perform ulcer/skin care regimen upon admission and as needed Assess ulceration(s) every visit Notes: Electronic Signature(s) Signed: 10/03/2017 5:51:08 PM By: Montey Hora Entered By: Montey Hora on 10/03/2017 15:45:28 Raymond [name] ([id number]) -------------------------------------------------------------------------------- Pain Assessment Details Patient Name: Raymond [name]. Date of Service: 10/03/2017 3:15 PM Medical Record Number: [id number] Patient Account Number: [id number] Date of Birth/Sex: December 15, 1965 (52 y.o. Male) Treating RN: Montey Hora Primary Care Shalik Sanfilippo: [name] Other Clinician: Referring Stanislawa Gaffin: [name] Treating Izeyah Deike/Extender: Melburn Hake, HOYT Weeks in Treatment: 4 Active Problems Location of Pain Severity and Description of Pain Patient Has Paino No Site Locations Pain Management and Medication Current Pain Management: Electronic Signature(s) Signed: 10/03/2017 5:51:08 PM By: Montey Hora Signed: 10/05/2017 4:37:30 PM By: Lorine Bears RCP, RRT, CHT Entered By: Lorine Bears on 10/03/2017 15:12:52 Raymond [name] ([id number]) -------------------------------------------------------------------------------- Patient/Caregiver Education Details Patient Name:  Raymond [name]. Date of Service: 10/03/2017 3:15 PM Medical Record Number: [id number] Patient Account Number: [id number] Date of Birth/Gender: November 28, 1965 (52 y.o. Male) Treating RN: Cornell Barman Primary Care Physician: [name] Other Clinician: Referring Physician: [name] Treating Physician/Extender: Sharalyn Ink in Treatment: 4 Education Assessment Education  Provided To: Patient Education Topics Provided Elevated Blood Sugar/ Impact on Healing: Handouts: Elevated Blood Sugars: How Do They Affect Wound Healing Methods: Explain/Verbal Responses: State content correctly Welcome To [facility]: Wound/Skin Impairment: Handouts: Caring for Your Ulcer, Other: dressing changes as prescribed Methods: Demonstration Responses: State content correctly Electronic Signature(s) Signed: 10/03/2017 6:56:50 PM By: Gretta Cool, BSN, RN, CWS, Kim RN, BSN Entered By: Gretta Cool, BSN, RN, CWS, Kim on 10/03/2017 15:58:26 Raymond [name] ([id number]) -------------------------------------------------------------------------------- Wound Assessment Details Patient Name: Raymond [name]. Date of Service: 10/03/2017 3:15 PM Medical Record Number: [id number] Patient Account Number: [id number] Date of Birth/Sex: [birth year] (52 y.o. Male) Treating RN: Secundino Ginger Primary Care Adren Dollins: [name] Other Clinician: Referring Derron Pipkins: [name] Treating Emaree Chiu/Extender: STONE III, HOYT Weeks in Treatment: 4 Wound Status Wound Number: 1 Primary Etiology: Diabetic Wound/Ulcer of the Lower Extremity Wound Location: Left Metatarsal head first - Distal Wound Status: Open Wounding Event: Gradually Appeared Comorbid Type II Diabetes Date Acquired: 07/11/2017 History: Weeks Of Treatment: 4 Clustered Wound: No Photos Photo Uploaded By: Secundino Ginger on 10/03/2017 15:27:00 Wound Measurements Length: (cm) 0.2 Width: (cm) 0.2 Depth: (cm) 0.2 Area: (cm) 0.031 Volume: (cm) 0.006 % Reduction in Area: 95.6% % Reduction in Volume: 95.7% Epithelialization: None Tunneling: No Undermining: No Wound Description Classification: Grade 2 Wound Margin: Indistinct, nonvisible Exudate Amount: Medium Exudate Type: Serous Exudate Color: amber Foul Odor After Cleansing: No Slough/Fibrino No Wound Bed Granulation Amount: Small (1-33%) Exposed  Structure Granulation Quality: Red Fascia Exposed: No Necrotic Amount: Medium (34-66%) Fat Layer (Subcutaneous Tissue) Exposed: Yes Necrotic Quality: Eschar Tendon Exposed: No Muscle Exposed: No Joint Exposed: No Bone Exposed: No Periwound Skin Texture Raymond [name], Raymond [name] ([id number]) Texture Color No Abnormalities Noted: No No Abnormalities Noted: No Callus: Yes Atrophie Blanche: No Crepitus: No Cyanosis: No Excoriation: No Ecchymosis: No Induration: No Erythema: No Rash: No Hemosiderin Staining: Yes Scarring: No Mottled: No Pallor: No Moisture Rubor: No No Abnormalities Noted: No Dry / Scaly: No Temperature / Pain Maceration: Yes Temperature: No Abnormality Wound Preparation Ulcer Cleansing: Rinsed/Irrigated with Saline Topical Anesthetic Applied: Other: lidocaine 4%, Treatment Notes Wound #1 (Left, Distal Metatarsal head first) 4. Dressing Applied: Prisma Ag 5. Secondary Dressing Applied Gauze and Kerlix/Conform Notes prisma Electronic Signature(s) Signed: 10/04/2017 9:53:02 AM By: Secundino Ginger Entered By: Secundino Ginger on 10/03/2017 15:23:49 Raymond [name] ([id number]) -------------------------------------------------------------------------------- [place] Details Patient Name: Raymond [name]. Date of Service: 10/03/2017 3:15 PM Medical Record Number: [id number] Patient Account Number: [id number] Date of Birth/Sex: 02-Jun-1965 (52 y.o. Male) Treating RN: Montey Hora Primary Care Jabrea Kallstrom: [name] Other Clinician: Referring Audyn Dimercurio: [name] Treating Danielys Madry/Extender: STONE III, HOYT Weeks in Treatment: 4 Vital Signs Time Taken: 15:12 Temperature (F): 98.7 Height (in): 74 Pulse (bpm): 74 Weight (lbs): 381 Respiratory Rate (breaths/min): 18 Body Mass Index (BMI): 48.9 Blood Pressure (mmHg): 123/58 Reference Range: 80 - 120 mg / dl Electronic Signature(s) Signed: 10/05/2017 4:37:30 PM By: Lorine Bears  RCP, RRT, CHT Entered By: Lorine Bears on 10/03/2017 15:14:53

## 2017-10-10 ENCOUNTER — Encounter: Payer: Medicare PPO | Admitting: Physician Assistant

## 2017-10-10 DIAGNOSIS — E11621 Type 2 diabetes mellitus with foot ulcer: Secondary | ICD-10-CM | POA: Diagnosis not present

## 2017-10-11 NOTE — Progress Notes (Signed)
ENDI, LAGMAN ([id number]) Visit Report for 10/10/2017 Chief Complaint Document Details Patient Name: Raymond [name], Raymond [name]. Date of Service: 10/10/2017 8:15 AM Medical Record Number: [id number] Patient Account Number: [id number] Date of Birth/Sex: 12/06/1965 (52 y.o. M) Treating RN: Montey Hora Primary Care Provider: [name] Other Clinician: Referring Provider: [name] Treating Provider/Extender: Melburn Hake, Koral Thaden Weeks in Treatment: 5 Information Obtained from: Patient Chief Complaint left foot Electronic Signature(s) Signed: 10/10/2017 1:59:57 PM By: [name] PA-C Entered By: [name] on 10/10/2017 08:43:10 Raymond [name] ([id number]) -------------------------------------------------------------------------------- Debridement Details Patient Name: Raymond [name]. Date of Service: 10/10/2017 8:15 AM Medical Record Number: [id number] Patient Account Number: [id number] Date of Birth/Sex: Jul 01, 1965 (52 y.o. M) Treating RN: Montey Hora Primary Care Provider: [name] Other Clinician: Referring Provider: [name] Treating Provider/Extender: STONE III, Cobey Raineri Weeks in Treatment: 5 Debridement Performed for Wound #1 Left,Distal Metatarsal head first Assessment: Performed By: Physician STONE III, Jahvier Aldea E., PA-C Debridement Type: Debridement Severity of Tissue Pre Fat layer exposed Debridement: Pre-procedure Verification/Time Yes - 08:45 Out Taken: Start Time: 08:45 Pain Control: Lidocaine 4% Topical Solution Total Area Debrided (L x W): 0.2 (cm) x 0.2 (cm) = 0.04 (cm) Tissue and other material Callus, Slough, Subcutaneous, Slough debrided: Level: Skin/Subcutaneous Tissue Debridement Description: Excisional Instrument: Curette Bleeding: Minimum Hemostasis Achieved: Pressure End Time: 08:49 Procedural Pain: 0 Post Procedural Pain: 0 Response to Treatment: Procedure was tolerated well Level of  Consciousness: Awake and Alert Post Debridement Measurements of Total Wound Length: (cm) 0.3 Width: (cm) 0.3 Depth: (cm) 0.2 Volume: (cm) 0.014 Character of Wound/Ulcer Post Debridement: Improved Severity of Tissue Post Debridement: Fat layer exposed Post Procedure Diagnosis Same as Pre-procedure Electronic Signature(s) Signed: 10/10/2017 1:59:57 PM By: [name] PA-C Signed: 10/10/2017 4:54:17 PM By: Montey Hora Entered By: Montey Hora on 10/10/2017 08:50:26 Raymond [name] ([id number]) -------------------------------------------------------------------------------- HPI Details Patient Name: Raymond [name]. Date of Service: 10/10/2017 8:15 AM Medical Record Number: [id number] Patient Account Number: [id number] Date of Birth/Sex: 1965-08-28 (52 y.o. M) Treating RN: Montey Hora Primary Care Provider: [name] Other Clinician: Referring Provider: [name] Treating Provider/Extender: Melburn Hake, Barbarann Kelly Weeks in Treatment: 5 History of Present Illness HPI Description: 09/05/17-He is seeing an initial evaluation for a left plantar foot ulcer. He has a remote history of left great toe amputation. He states that 4-6 weeks ago he noted callus formation and ulceration. He has not seen primary care regarding this. He is not currently on antibiotic therapy. He does not routinely follow with podiatry. He states diabetic foot wear will arrive early next week. The EHR shows an A1c of 9% approximate 4 months ago but he states he had one and primary care a few weeks ago but does not know the results. He is neuropathic and does not complain of any pain, he is currently wearing crocs. 09/12/17-he is here in follow up evaluation for left plantar foot ulcer. There is improvement in both appearance and measurement. We will continue with same treatment plan and he will follow up next week. 09/19/17 on evaluation today patient actually appears to be doing excellent in  regard to his ulcer on the invitation site of his left great foot plantar aspect. He's been tolerating the dressing changes without complication. In fact with the Prisma and the current measures he has been shown signs of excellent improvement week by week up to this point. We have been to breeding the wound in this seems to have been of great benefit for him.  Fortunately there is no evidence of infection. 09/26/17 on evaluation today patient appears to be doing rather well in regard to his wound. He did not know quite as much improvement this week as compared to last week. Nonetheless he still continues to show signs of improving to some degree. I do believe he may benefit from an offloading shoe. No fevers, chills, nausea, or vomiting noted at this time. 10/03/17 on evaluation today patient actually appears to be doing much better in regard to the amputation site plantar foot ulcer. Overall this appears significantly smaller even compared to previous. He's been tolerating the dressing changes without complication. He did get his diabetic shoes and I did have a look at them they appear to be fairly good. Nonetheless I do think that for the time being I would probably recommend he continue with the offloading shoe that we have been utilizing just due to the fact that with the dressing I don't know that his diabetic shoes are going to work as appropriately as far as not called an additional pressure and irritation to the area in question. He understands. 10/10/17 on evaluation today patient actually appears to be doing very well in regard to his plantar foot ulcer. He has been tolerating the dressing changes without complication. I do feel like he's making signs of good improvement and in fact of the wound bed appears to be better although it may not be significantly changed in size it appears healthier and I do believe is showing signs of improving. Nonetheless we gonna keep working towards healing as  far as that is concerned. There is no evidence of infection. Electronic Signature(s) Signed: 10/10/2017 1:59:57 PM By: [name] PA-C Entered By: [name] on 10/10/2017 08:51:58 Raymond [name] ([id number]) -------------------------------------------------------------------------------- Physical Exam Details Patient Name: Raymond [name]. Date of Service: 10/10/2017 8:15 AM Medical Record Number: [id number] Patient Account Number: [id number] Date of Birth/Sex: 1965-02-27 (52 y.o. M) Treating RN: Montey Hora Primary Care Provider: [name] Other Clinician: Referring Provider: [name] Treating Provider/Extender: STONE III, Odilia Damico Weeks in Treatment: 5 Constitutional Well-nourished and well-hydrated in no acute distress. Respiratory normal breathing without difficulty. Psychiatric this patient is able to make decisions and demonstrates good insight into disease process. Alert and Oriented x 3. pleasant and cooperative. Notes Currently my suggestion is gonna be that we go ahead and sharply debride away the callous and necrotic tissue from the surface and surrounding of the wound. Patient tolerated this without complication post debridement the wound bed appears to be doing much better. Electronic Signature(s) Signed: 10/10/2017 1:59:57 PM By: [name] PA-C Entered By: [name] on 10/10/2017 08:53:30 Raymond [name] ([id number]) -------------------------------------------------------------------------------- Physician Orders Details Patient Name: Raymond [name]. Date of Service: 10/10/2017 8:15 AM Medical Record Number: [id number] Patient Account Number: [id number] Date of Birth/Sex: 07/15/1965 (52 y.o. 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 o Cleanse wound with mild soap and water Anesthetic (add to Medication List) Wound #1 Left,Distal Metatarsal head first o Topical Lidocaine 4% cream applied to wound bed prior to debridement (In Clinic Only). Primary Wound Dressing  Wound #1 Left,Distal Metatarsal head first o Silver Collagen - lightly moisten with saline (place in wound first) Secondary Dressing Wound #1 Left,Distal Metatarsal head first o Dry Gauze o Conform/Kerlix Dressing Change Frequency Wound #1 Left,Distal Metatarsal head first o Change dressing every other day. o Other: - as needed Follow-up Appointments Wound #1 Left,Distal Metatarsal head first o Return Appointment in 1 week. Edema Control Wound #1 Left,Distal Metatarsal head first o Elevate legs to the level of the heart and pump ankles as often as possible Off-Loading Wound #1 Left,Distal Metatarsal head first o Open toe surgical shoe with peg assist. Raymond [name] ([id number]) Additional Orders / Instructions Wound #1 Left,Distal Metatarsal head first o Increase protein intake. Electronic Signature(s) Signed: 10/10/2017 1:59:57 PM By: [name] PA-C Signed: 10/10/2017 4:54:17 PM By: Montey Hora Entered By: Montey Hora on 10/10/2017 08:50:57 Raymond [name] ([id number]) -------------------------------------------------------------------------------- Problem List Details Patient Name: Raymond [name]. Date of Service: 10/10/2017 8:15 AM Medical Record Number: [id number] Patient Account Number: [id number] Date of Birth/Sex: Jun 13, 1965 (52 y.o. M) Treating RN: Montey Hora Primary Care Provider: [name]  Other Clinician: Referring Provider: [name] Treating Provider/Extender: Melburn Hake, Davius Goudeau Weeks in Treatment: 5 Active Problems ICD-10 Evaluated Encounter Code Description Active Date Today Diagnosis L97.522 Non-pressure chronic ulcer of other part of left foot with fat 09/05/2017 No Yes layer exposed E11.621 Type 2 diabetes mellitus with foot ulcer 09/05/2017 No Yes Inactive Problems Resolved Problems Electronic Signature(s) Signed: 10/10/2017 1:59:57 PM By: [name] PA-C Entered By: [name] on 10/10/2017 08:43:05 Raymond [name] ([id number]) -------------------------------------------------------------------------------- Progress Note Details Patient Name: Raymond [name]. Date of Service: 10/10/2017 8:15 AM Medical Record Number: [id number] Patient Account Number: [id number] Date of Birth/Sex: 18-Jul-1965 (52 y.o. M) Treating RN: Montey Hora Primary Care Provider: [name] Other Clinician: Referring Provider: [name] Treating Provider/Extender: Melburn Hake, Lc Joynt Weeks in Treatment: 5 Subjective Chief Complaint Information obtained from Patient left foot History of Present Illness (HPI) 09/05/17-He is seeing an initial evaluation for a left plantar foot ulcer. He has a remote history of left great toe amputation. He states that 4-6 weeks ago he noted callus formation and ulceration. He has not seen primary care regarding this. He is not currently on antibiotic therapy. He does not routinely follow with podiatry. He states diabetic foot wear will arrive early next week. The EHR shows an A1c of 9% approximate 4 months ago but he states he had one and primary care a few weeks ago but does not know the results. He is neuropathic and does not complain of any pain, he is currently wearing crocs. 09/12/17-he is here in follow up evaluation for left plantar foot ulcer. There is improvement in both appearance and measurement. We will continue  with same treatment plan and he will follow up next week. 09/19/17 on evaluation today patient actually appears to be doing excellent in regard to his ulcer on the invitation site of his left great foot plantar aspect. He's been tolerating the dressing changes without complication. In fact with the Prisma and the current measures he has been shown signs of excellent improvement week by week up to this point. We have been to breeding the wound in this seems to have been of great benefit for him. Fortunately there is no evidence of infection. 09/26/17 on evaluation today patient appears to be doing rather well in regard to his wound. He did not know quite as much improvement this week as compared to last  week. Nonetheless he still continues to show signs of improving to some degree. I do believe he may benefit from an offloading shoe. No fevers, chills, nausea, or vomiting noted at this time. 10/03/17 on evaluation today patient actually appears to be doing much better in regard to the amputation site plantar foot ulcer. Overall this appears significantly smaller even compared to previous. He's been tolerating the dressing changes without complication. He did get his diabetic shoes and I did have a look at them they appear to be fairly good. Nonetheless I do think that for the time being I would probably recommend he continue with the offloading shoe that we have been utilizing just due to the fact that with the dressing I don't know that his diabetic shoes are going to work as appropriately as far as not called an additional pressure and irritation to the area in question. He understands. 10/10/17 on evaluation today patient actually appears to be doing very well in regard to his plantar foot ulcer. He has been tolerating the dressing changes without complication. I do feel like he's making signs of good improvement and in fact of the wound bed appears to be better although it may not be significantly  changed in size it appears healthier and I do believe is showing signs of improving. Nonetheless we gonna keep working towards healing as far as that is concerned. There is no evidence of infection. Patient History Information obtained from Patient. Family History Diabetes - Mother,Maternal Grandparents, Hypertension - Father, Stroke - Father, No family history of Cancer, Heart Disease, Hereditary Spherocytosis, Kidney Disease, Lung Disease, Seizures, Thyroid Problems, Tuberculosis. Social History Never smoker, Marital Status - Divorced, Alcohol Use - Rarely, Drug Use - No History, Caffeine Use - Never. Raymond [name], Raymond [name] ([id number]) Review of Systems (ROS) Constitutional Symptoms (General Health) Denies complaints or symptoms of Fever, Chills. Respiratory The patient has no complaints or symptoms. Cardiovascular The patient has no complaints or symptoms. Psychiatric The patient has no complaints or symptoms. Objective Constitutional Well-nourished and well-hydrated in no acute distress. Vitals Time Taken: 8:10 AM, Height: 74 in, Weight: 381 lbs, BMI: 48.9, Temperature: 98.4 F, Pulse: 68 bpm, Respiratory Rate: 18 breaths/min, Blood Pressure: 147/67 mmHg. Respiratory normal breathing without difficulty. Psychiatric this patient is able to make decisions and demonstrates good insight into disease process. Alert and Oriented x 3. pleasant and cooperative. General Notes: Currently my suggestion is gonna be that we go ahead and sharply debride away the callous and necrotic tissue from the surface and surrounding of the wound. Patient tolerated this without complication post debridement the wound bed appears to be doing much better. Integumentary (Hair, Skin) Wound #1 status is Open. Original cause of wound was Gradually Appeared. The wound is located on the Left,Distal Metatarsal head first. The wound measures 0.2cm length x 0.2cm width x 0.2cm depth; 0.031cm^2 area and  0.006cm^3 volume. There is Fat Layer (Subcutaneous Tissue) Exposed exposed. There is no tunneling or undermining noted. There is a small amount of serosanguineous drainage noted. The wound margin is indistinct and nonvisible. There is small (1-33%) red granulation within the wound bed. There is a medium (34-66%) amount of necrotic tissue within the wound bed including Eschar. The periwound skin appearance exhibited: Callus, Maceration, Hemosiderin Staining. The periwound skin appearance did not exhibit: Crepitus, Excoriation, Induration, Rash, Scarring, Dry/Scaly, Atrophie Blanche, Cyanosis, Ecchymosis, Mottled, Pallor, Rubor, Erythema. Periwound temperature was noted as No Abnormality. Assessment Active Problems ICD-10 Raymond [name], Raymond [name] ([id number]) Non-pressure chronic ulcer of other  part of left foot with fat layer exposed Type 2 diabetes mellitus with foot ulcer Procedures Wound #1 Pre-procedure diagnosis of Wound #1 is a Diabetic Wound/Ulcer of the Lower Extremity located on the Left,Distal Metatarsal head first .Severity of Tissue Pre Debridement is: Fat layer exposed. There was a Excisional Skin/Subcutaneous Tissue Debridement with a total area of 0.04 sq cm performed by STONE III, Naira Standiford E., PA-C. With the following instrument(s): Curette Material removed includes Callus, Subcutaneous Tissue, and Slough after achieving pain control using Lidocaine 4% Topical Solution. No specimens were taken. A time out was conducted at 08:45, prior to the start of the procedure. A Minimum amount of bleeding was controlled with Pressure. The procedure was tolerated well with a pain level of 0 throughout and a pain level of 0 following the procedure. Patient s Level of Consciousness post procedure was recorded as Awake and Alert. Post Debridement Measurements: 0.3cm length x 0.3cm width x 0.2cm depth; 0.014cm^3 volume. Character of Wound/Ulcer Post Debridement is improved. Severity of Tissue Post  Debridement is: Fat layer exposed. Post procedure Diagnosis Wound #1: Same as Pre-Procedure Plan Wound Cleansing: Wound #1 Left,Distal Metatarsal head first: Clean wound with Normal Saline. Cleanse wound with mild soap and water Anesthetic (add to Medication List): Wound #1 Left,Distal Metatarsal head first: Topical Lidocaine 4% cream applied to wound bed prior to debridement (In Clinic Only). Primary Wound Dressing: Wound #1 Left,Distal Metatarsal head first: Silver Collagen - lightly moisten with saline (place in wound first) Secondary Dressing: Wound #1 Left,Distal Metatarsal head first: Dry Gauze Conform/Kerlix Dressing Change Frequency: Wound #1 Left,Distal Metatarsal head first: Change dressing every other day. Other: - as needed Follow-up Appointments: Wound #1 Left,Distal Metatarsal head first: Return Appointment in 1 week. Edema Control: Wound #1 Left,Distal Metatarsal head first: Elevate legs to the level of the heart and pump ankles as often as possible Off-Loading: Wound #1 Left,Distal Metatarsal head first: Open toe surgical shoe with peg assist. Additional Orders / Instructions: Raymond [name], Raymond [name] ([id number]) Wound #1 Left,Distal Metatarsal head first: Increase protein intake. We will continue with the above wound care measures for the next week. Patient will continue with his offloading shoe as well I explained I do think this is gonna be very important for him to do. He understands. We will subsequently see were things stand at follow-up. Please see above for specific wound care orders. We will see patient for re-evaluation in 1 week(s) here in the clinic. If anything worsens or changes patient will contact our office for additional recommendations. Electronic Signature(s) Signed: 10/10/2017 1:59:57 PM By: [name] PA-C Entered By: [name] on 10/10/2017 08:53:51 Raymond [name]  ([id number]) -------------------------------------------------------------------------------- ROS/PFSH Details Patient Name: Raymond [name]. Date of Service: 10/10/2017 8:15 AM Medical Record Number: [id number] Patient Account Number: [id number] Date of Birth/Sex: 01/09/1966 (52 y.o. M) Treating RN: Montey Hora Primary Care Provider: [name] Other Clinician: Referring Provider: [name] Treating Provider/Extender: Melburn Hake, Caralyn Twining Weeks in Treatment: 5 Information Obtained From Patient Wound History Do you currently have one or more open woundso Yes How many open wounds do you currently haveo 1 Approximately how long have you had your woundso 3 weeks How have you been treating your wound(s) until nowo clened and bandaid Has your wound(s) ever healed and then re-openedo No Have you had any lab work done in the past montho No Have you tested positive for an antibiotic resistant organism (MRSA, VRE)o No Have you tested positive for osteomyelitis (bone infection)o No  Have you had any tests for circulation on your legso No Constitutional Symptoms (General Health) Complaints and Symptoms: Negative for: Fever; Chills Respiratory Complaints and Symptoms: No Complaints or Symptoms Cardiovascular Complaints and Symptoms: No Complaints or Symptoms Endocrine Medical History: Positive for: Type II Diabetes Negative for: Type I Diabetes Time with diabetes: 15 years Treated with: Insulin, Oral agents Blood sugar tested every day: Yes Tested : PRN Psychiatric Complaints and Symptoms: No Complaints or Symptoms Immunizations Pneumococcal Vaccine: Received Pneumococcal Vaccination: No Tetanus Vaccine: Last tetanus shot: 09/05/2016 Raymond [name] ([id number]) Implantable Devices Family and Social History Cancer: No; Diabetes: Yes - Mother,Maternal Grandparents; Heart Disease: No; Hereditary Spherocytosis: No; Hypertension: Yes - Father; Kidney Disease: No;  Lung Disease: No; Seizures: No; Stroke: Yes - Father; Thyroid Problems: No; Tuberculosis: No; Never smoker; Marital Status - Divorced; Alcohol Use: Rarely; Drug Use: No History; Caffeine Use: Never; Financial Concerns: No; Food, Clothing or Shelter Needs: No; Support System Lacking: No; Transportation Concerns: No; Advanced Directives: Yes - glenda (Not Provided); Patient does not want information on Advanced Directives; Do not resuscitate: No Physician Affirmation I have reviewed and agree with the above information. Electronic Signature(s) Signed: 10/10/2017 1:59:57 PM By: [name] PA-C Signed: 10/10/2017 4:54:17 PM By: Montey Hora Entered By: [name] on 10/10/2017 08:52:17 Raymond [name] ([id number]) -------------------------------------------------------------------------------- SuperBill Details Patient Name: Raymond [name]. Date of Service: 10/10/2017 Medical Record Number: [id number] Patient Account Number: [id number] Date of Birth/Sex: 06-09-1965 (52 y.o. 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.621 Type 2 diabetes mellitus with foot ulcer Facility Procedures CPT4 Code: 18841660 Description: 63016 - DEB SUBQ TISSUE 20 SQ CM/< ICD-10 Diagnosis Description L97.522 Non-pressure chronic ulcer of other part of left foot with fat Modifier: layer exposed Quantity: 1 Physician Procedures CPT4 Code: 0109323 Description: 55732 - WC PHYS SUBQ TISS 20 SQ CM ICD-10 Diagnosis Description L97.522 Non-pressure chronic ulcer of other part of left foot with fat Modifier: layer exposed Quantity: 1 Electronic Signature(s) Signed: 10/10/2017 1:59:57 PM By: [name]  PA-C Entered By: [name] on 10/10/2017 08:53:58

## 2017-10-17 ENCOUNTER — Encounter: Payer: Medicare PPO | Admitting: Physician Assistant

## 2017-10-17 ENCOUNTER — Other Ambulatory Visit
Admission: RE | Admit: 2017-10-17 | Discharge: 2017-10-17 | Disposition: A | Discharge status: Home / Self Care | Payer: Medicare PPO | Source: Ambulatory Visit | Attending: Physician Assistant | Admitting: Physician Assistant

## 2017-10-17 DIAGNOSIS — E11621 Type 2 diabetes mellitus with foot ulcer: Secondary | ICD-10-CM | POA: Diagnosis not present

## 2017-10-17 DIAGNOSIS — B999 Unspecified infectious disease: Secondary | ICD-10-CM | POA: Diagnosis present

## 2017-10-18 NOTE — Progress Notes (Signed)
JARYAN, CHICOINE ([id number]) Visit Report for 10/10/2017 Arrival Information Details Patient Name: Raymond [name], Raymond [name]. Date of Service: 10/10/2017 8:15 AM Medical Record Number: [id number] Patient Account Number: [id number] Date of Birth/Sex: 07-17-1965 (52 y.o. M) Treating RN: Cornell Barman Primary Care Aara Jacquot: [name] Other Clinician: Referring Merric Yost: [name] Treating Timmey Lamba/Extender: Melburn Hake, HOYT Weeks in Treatment: 5 Visit Information History Since Last Visit Added or deleted any medications: No Patient Arrived: Ambulatory Any new allergies or adverse reactions: No Arrival Time: 08:08 Had a fall or experienced change in No Accompanied By: self activities of daily living that may affect Transfer Assistance: None risk of falls: Patient Has Alerts: Yes Signs or symptoms of abuse/neglect since last visito No Patient Alerts: DMII Hospitalized since last visit: No Implantable device outside of the clinic excluding No cellular tissue based products placed in the center since last visit: Has Dressing in Place as Prescribed: Yes Pain Present Now: No Electronic Signature(s) Signed: 10/10/2017 11:45:15 AM By: Lorine Bears RCP, RRT, CHT Entered By: Lorine Bears on 10/10/2017 08:10:21 Raymond [name] ([id number]) -------------------------------------------------------------------------------- Encounter Discharge Information Details Patient Name: Raymond [name]. Date of Service: 10/10/2017 8:15 AM Medical Record Number: [id number] Patient Account Number: [id number] Date of Birth/Sex: Jul 03, 1965 (51 y.o. M) Treating RN: Roger Shelter Primary Care Grettell Ransdell: [name] Other Clinician: Referring Rasheed Welty: [name] Treating Kiandra Sanguinetti/Extender: Melburn Hake, HOYT Weeks in Treatment: 5 Encounter Discharge Information Items Discharge Condition: Stable Ambulatory Status: Ambulatory Discharge  Destination: Home Transportation: Other Schedule Follow-up Appointment: No Clinical Summary of Care: Electronic Signature(s) Signed: 10/10/2017 4:32:14 PM By: Roger Shelter Entered By: Roger Shelter on 10/10/2017 08:58:26 Raymond [name] ([id number]) -------------------------------------------------------------------------------- Lower Extremity Assessment Details Patient Name: Raymond [name]. Date of Service: 10/10/2017 8:15 AM Medical Record Number: [id number] Patient Account Number: [id number] Date of Birth/Sex: April 09, 1965 (52 y.o. M) Treating RN: Roger Shelter Primary Care Zebadiah Willert: [name] Other Clinician: Referring Naszir Cott: [name] Treating Izick Gasbarro/Extender: STONE III, HOYT Weeks in Treatment: 5 Edema Assessment Assessed: [Left: No] [Right: No] Edema: [Left: N] [Right: o] Vascular Assessment Claudication: Claudication Assessment [Left:None] Pulses: Dorsalis Pedis Palpable: [Left:Yes] Posterior Tibial Extremity colors, hair growth, and conditions: Extremity Color: [Left:Normal] Hair Growth on Extremity: [Left:No] Temperature of Extremity: [Left:Warm] Capillary Refill: [Left:< 3 seconds] Toe Nail Assessment Left: Right: Thick: No Discolored: No Deformed: No Improper Length and Hygiene: No Electronic Signature(s) Signed: 10/10/2017 4:32:14 PM By: Roger Shelter Entered By: Roger Shelter on 10/10/2017 08:27:28 Raymond [name] ([id number]) -------------------------------------------------------------------------------- Multi Wound Chart Details Patient Name: Raymond [name]. Date of Service: 10/10/2017 8:15 AM Medical Record Number: [id number] Patient Account Number: [id number] Date of Birth/Sex: [birth year] (52 y.o. M) Treating RN: Montey Hora Primary Care Danella Philson: [name] Other Clinician: Referring Liba Hulsey: [name] Treating Karlei Waldo/Extender: STONE III, HOYT Weeks in Treatment: 5 Vital  Signs Height(in): 74 Pulse(bpm): 68 Weight(lbs): 381 Blood Pressure(mmHg): 147/67 Body Mass Index(BMI): 49 Temperature(F): 98.4 Respiratory Rate 18 (breaths/min): Photos: [1:No Photos] [N/A:N/A] Wound Location: [1:Left Metatarsal head first - Distal] [N/A:N/A] Wounding Event: [1:Gradually Appeared] [N/A:N/A] Primary Etiology: [1:Diabetic Wound/Ulcer of the Lower Extremity] [N/A:N/A] Comorbid History: [1:Type II Diabetes] [N/A:N/A] Date Acquired: [1:07/11/2017] [N/A:N/A] Weeks of Treatment: [1:5] [N/A:N/A] Wound Status: [1:Open] [N/A:N/A] Measurements L x W x D [1:0.2x0.2x0.2] [N/A:N/A] (cm) Area (cm) : [1:0.031] [N/A:N/A] Volume (cm) : [1:0.006] [N/A:N/A] % Reduction in Area: [1:95.60%] [N/A:N/A] % Reduction in Volume: [1:95.70%] [N/A:N/A] Classification: [1:Grade 2] [N/A:N/A] Exudate Amount: [1:Small] [N/A:N/A] Exudate Type: [1:Serosanguineous] [N/A:N/A] Exudate Color: [1:red, brown] [N/A:N/A] Wound Margin: [1:Indistinct, nonvisible] [N/A:N/A]  Granulation Amount: [1:Small (1-33%)] [N/A:N/A] Granulation Quality: [1:Red] [N/A:N/A] Necrotic Amount: [1:Medium (34-66%)] [N/A:N/A] Necrotic Tissue: [1:Eschar] [N/A:N/A] Exposed Structures: [1:Fat Layer (Subcutaneous Tissue) Exposed: Yes Fascia: No Tendon: No Muscle: No Joint: No Bone: No] [N/A:N/A] Epithelialization: [1:None] [N/A:N/A] Periwound Skin Texture: [1:Callus: Yes Excoriation: No Induration: No Crepitus: No] [N/A:N/A] Rash: No Scarring: No Periwound Skin Moisture: Maceration: Yes N/A N/A Dry/Scaly: No Periwound Skin Color: Hemosiderin Staining: Yes N/A N/A Atrophie Blanche: No Cyanosis: No Ecchymosis: No Erythema: No Mottled: No Pallor: No Rubor: No Temperature: No Abnormality N/A N/A Tenderness on Palpation: No N/A N/A Wound Preparation: Ulcer Cleansing: N/A N/A Rinsed/Irrigated with Saline Topical Anesthetic Applied: Other: lidocaine 4% Treatment Notes Electronic Signature(s) Signed: 10/10/2017  4:54:17 PM By: Montey Hora Entered By: Montey Hora on 10/10/2017 08:44:45 Raymond [name] ([id number]) -------------------------------------------------------------------------------- Multi-Disciplinary Care Plan Details Patient Name: Raymond [name]. Date of Service: 10/10/2017 8:15 AM Medical Record Number: [id number] Patient Account Number: [id number] Date of Birth/Sex: 1965-12-20 (52 y.o. M) Treating RN: Montey Hora Primary Care Finnigan Warriner: [name] Other Clinician: Referring Tationna Fullard: [name] Treating Emmy Keng/Extender: Melburn Hake, HOYT Weeks in Treatment: 5 Active Inactive ` Abuse / Safety / Falls / Self Care Management Nursing Diagnoses: Potential for falls Goals: Patient will not experience any injury related to falls Date Initiated: 09/05/2017 Target Resolution Date: 12/09/2017 Goal Status: Active Interventions: Assess fall risk on admission and as needed Assess: immobility, friction, shearing, incontinence upon admission and as needed Assess impairment of mobility on admission and as needed per policy Assess personal safety and home safety (as indicated) on admission and as needed Assess self care needs on admission and as needed Notes: ` Nutrition Nursing Diagnoses: Imbalanced nutrition Impaired glucose control: actual or potential Potential for alteratiion in Nutrition/Potential for imbalanced nutrition Goals: Patient/caregiver agrees to and verbalizes understanding of need to use nutritional supplements and/or vitamins as prescribed Date Initiated: 09/05/2017 Target Resolution Date: 01/06/2018 Goal Status: Active Patient/caregiver will maintain therapeutic glucose control Date Initiated: 09/05/2017 Target Resolution Date: 01/06/2018 Goal Status: Active Interventions: Assess patient nutrition upon admission and as needed per policy Provide education on elevated blood sugars and impact on wound healing Provide education on  nutrition Notes: Raymond [name], Raymond [name] ([id number]) Orientation to the Wound Care Program Nursing Diagnoses: Knowledge deficit related to the wound healing center program Goals: Patient/caregiver will verbalize understanding of the [place] Date Initiated: 09/05/2017 Target Resolution Date: 10/07/2017 Goal Status: Active Interventions: Provide education on orientation to the wound center Notes: ` Wound/Skin Impairment Nursing Diagnoses: Impaired tissue integrity Knowledge deficit related to ulceration/compromised skin integrity Goals: Ulcer/skin breakdown will have a volume reduction of 80% by week 12 Date Initiated: 09/05/2017 Target Resolution Date: 01/06/2018 Goal Status: Active Interventions: Assess patient/caregiver ability to perform ulcer/skin care regimen upon admission and as needed Assess ulceration(s) every visit Notes: Electronic Signature(s) Signed: 10/10/2017 4:54:17 PM By: Montey Hora Entered By: Montey Hora on 10/10/2017 08:44:36 Raymond [name] ([id number]) -------------------------------------------------------------------------------- Pain Assessment Details Patient Name: Raymond [name]. Date of Service: 10/10/2017 8:15 AM Medical Record Number: [id number] Patient Account Number: [id number] Date of Birth/Sex: 04/17/1965 (51 y.o. M) Treating RN: Cornell Barman Primary Care Jhene Westmoreland: [name] Other Clinician: Referring Florence Yeung: [name] Treating Tyeisha Dinan/Extender: Melburn Hake, HOYT Weeks in Treatment: 5 Active Problems Location of Pain Severity and Description of Pain Patient Has Paino No Site Locations Pain Management and Medication Current Pain Management: Electronic Signature(s) Signed: 10/10/2017 11:45:15 AM By: Paulla Fore, RRT, CHT Signed: 10/16/2017 4:52:26 PM By: Gretta Cool, BSN,  RN, CWS, Kim RN, BSN Entered By: Lorine Bears on 10/10/2017 08:10:28 Raymond [name] ([id number]) -------------------------------------------------------------------------------- Patient/Caregiver Education Details Patient Name: Raymond [name]. Date of Service: 10/10/2017 8:15 AM Medical Record Number: [id number] Patient Account Number: [id number] Date of Birth/Gender: 04/13/1965 (52 y.o. M) Treating RN: Roger Shelter Primary Care Physician: [name] Other Clinician: Referring Physician: [name] Treating Physician/Extender: Sharalyn Ink in Treatment: 5 Education Assessment Education Provided To: Patient Education Topics Provided Wound Debridement: Handouts: Wound Debridement Methods: Explain/Verbal Responses: State content correctly Wound/Skin Impairment: Handouts: Caring for Your Ulcer Methods: Explain/Verbal Responses: State content correctly Electronic Signature(s) Signed: 10/10/2017 4:32:14 PM By: Roger Shelter Entered By: Roger Shelter on 10/10/2017 08:58:43 Raymond [name] ([id number]) -------------------------------------------------------------------------------- Wound Assessment Details Patient Name: Raymond [name]. Date of Service: 10/10/2017 8:15 AM Medical Record Number: [id number] Patient Account Number: [id number] Date of Birth/Sex: 1965-12-29 (52 y.o. M) Treating RN: Roger Shelter Primary Care Shatora Weatherbee: [name] Other Clinician: Referring Ioana Louks: [name] Treating Dorris Vangorder/Extender: STONE III, HOYT Weeks in Treatment: 5 Wound Status Wound Number: 1 Primary Etiology: Diabetic Wound/Ulcer of the Lower Extremity Wound Location: Left Metatarsal head first - Distal Wound Status: Open Wounding Event: Gradually Appeared Comorbid Type II Diabetes Date Acquired: 07/11/2017 History: Weeks Of Treatment: 5 Clustered Wound: No Photos Photo Uploaded By: Roger Shelter on 10/10/2017 10:16:24 Wound Measurements Length: (cm) 0.2 Width: (cm) 0.2 Depth: (cm) 0.2 Area: (cm)  0.031 Volume: (cm) 0.006 % Reduction in Area: 95.6% % Reduction in Volume: 95.7% Epithelialization: None Tunneling: No Undermining: No Wound Description Classification: Grade 2 Wound Margin: Indistinct, nonvisible Exudate Amount: Small Exudate Type: Serosanguineous Exudate Color: red, brown Foul Odor After Cleansing: No Slough/Fibrino No Wound Bed Granulation Amount: Small (1-33%) Exposed Structure Granulation Quality: Red Fascia Exposed: No Necrotic Amount: Medium (34-66%) Fat Layer (Subcutaneous Tissue) Exposed: Yes Necrotic Quality: Eschar Tendon Exposed: No Muscle Exposed: No Joint Exposed: No Bone Exposed: No Periwound Skin Texture Raymond [name], Raymond [name] ([id number]) Texture Color No Abnormalities Noted: No No Abnormalities Noted: No Callus: Yes Atrophie Blanche: No Crepitus: No Cyanosis: No Excoriation: No Ecchymosis: No Induration: No Erythema: No Rash: No Hemosiderin Staining: Yes Scarring: No Mottled: No Pallor: No Moisture Rubor: No No Abnormalities Noted: No Dry / Scaly: No Temperature / Pain Maceration: Yes Temperature: No Abnormality Wound Preparation Ulcer Cleansing: Rinsed/Irrigated with Saline Topical Anesthetic Applied: Other: lidocaine 4%, Treatment Notes Wound #1 (Left, Distal Metatarsal head first) 1. Cleansed with: Clean wound with Normal Saline 2. Anesthetic Topical Lidocaine 4% cream to wound bed prior to debridement 4. Dressing Applied: Prisma Ag 5. Secondary Dressing Applied Dry Gauze Kerlix/Conform Notes compression sock with offloading shoe peg assist Electronic Signature(s) Signed: 10/10/2017 4:32:14 PM By: Roger Shelter Entered By: Roger Shelter on 10/10/2017 08:26:49 Raymond [name] ([id number]) -------------------------------------------------------------------------------- Vitals Details Patient Name: Raymond [name]. Date of Service: 10/10/2017 8:15 AM Medical Record Number:  [id number] Patient Account Number: [id number] Date of Birth/Sex: 10-Nov-1965 (52 y.o. M) Treating RN: Cornell Barman Primary Care Zackry Deines: [name] Other Clinician: Referring Zuhair Lariccia: [name] Treating Nyheim Seufert/Extender: Melburn Hake, HOYT Weeks in Treatment: 5 Vital Signs Time Taken: 08:10 Temperature (F): 98.4 Height (in): 74 Pulse (bpm): 68 Weight (lbs): 381 Respiratory Rate (breaths/min): 18 Body Mass Index (BMI): 48.9 Blood Pressure (mmHg): 147/67 Reference Range: 80 - 120 mg / dl Electronic Signature(s) Signed: 10/10/2017 11:45:15 AM By: Lorine Bears RCP, RRT, CHT Entered By: Lorine Bears on 10/10/2017 08:12:29

## 2017-10-19 LAB — AEROBIC CULTURE W GRAM STAIN (SUPERFICIAL SPECIMEN)

## 2017-10-19 LAB — AEROBIC CULTURE  (SUPERFICIAL SPECIMEN)

## 2017-10-19 NOTE — Progress Notes (Signed)
DONTREY, SNELLGROVE ([id number]) Visit Report for 10/17/2017 Chief Complaint Document Details Patient Name: Raymond [name], Raymond [name]. Date of Service: 10/17/2017 8:15 AM Medical Record Number: [id number] Patient Account Number: [id number] Date of Birth/Sex: January 24, 1966 (53 y.o. M) Treating RN: Montey Hora Primary Care Provider: [name] Other Clinician: Referring Provider: [name] Treating Provider/Extender: Melburn Hake, Shannara Winbush Weeks in Treatment: 6 Information Obtained from: Patient Chief Complaint left foot Electronic Signature(s) Signed: 10/17/2017 4:58:18 PM By: [name] PA-C Entered By: [name] on 10/17/2017 08:10:13 Raymond [name] ([id number]) -------------------------------------------------------------------------------- Debridement Details Patient Name: Raymond [name]. Date of Service: 10/17/2017 8:15 AM Medical Record Number: [id number] Patient Account Number: [id number] Date of Birth/Sex: Feb 13, 1965 (52 y.o. M) Treating RN: Montey Hora Primary Care Provider: [name] Other Clinician: Referring Provider: [name] Treating Provider/Extender: Melburn Hake, Addysin Porco Weeks in Treatment: 6 Debridement Performed for Wound #1 Left,Distal Metatarsal head first Assessment: Performed By: Physician STONE III, Othon Guardia E., PA-C Debridement Type: Debridement Severity of Tissue Pre Fat layer exposed Debridement: Level of Consciousness (Pre- Awake and Alert procedure): Pre-procedure Verification/Time Yes - 08:44 Out Taken: Start Time: 08:44 Pain Control: Lidocaine 4% Topical Solution Total Area Debrided (L x W): 0.3 (cm) x 0.3 (cm) = 0.09 (cm) Tissue and other material Viable, Non-Viable, Slough, Subcutaneous, Skin: Dermis , Skin: Epidermis, Slough debrided: Level: Skin/Subcutaneous Tissue Debridement Description: Excisional Instrument: Curette, Forceps, Scissors Specimen: Swab, Number of Specimens Taken: 1 Bleeding:  Minimum Hemostasis Achieved: Pressure End Time: 08:50 Procedural Pain: 0 Post Procedural Pain: 0 Response to Treatment: Procedure was tolerated well Level of Consciousness Awake and Alert (Post-procedure): Post Debridement Measurements of Total Wound Length: (cm) 3.6 Width: (cm) 1.8 Depth: (cm) 0.4 Volume: (cm) 2.036 Character of Wound/Ulcer Post Debridement: Improved Severity of Tissue Post Debridement: Fat layer exposed Post Procedure Diagnosis Same as Pre-procedure Electronic Signature(s) Signed: 10/17/2017 4:58:18 PM By: [name] PA-C Signed: 10/17/2017 5:04:25 PM By: Montey Hora Entered By: Montey Hora on 10/17/2017 08:52:21 Raymond [name] ([id number]) Raymond [name] ([id number]) -------------------------------------------------------------------------------- HPI Details Patient Name: Raymond [name]. Date of Service: 10/17/2017 8:15 AM Medical Record Number: [id number] Patient Account Number: [id number] Date of Birth/Sex: [birth year] (52 y.o. M) Treating RN: Montey Hora Primary Care Provider: [name] Other Clinician: Referring Provider: [name] Treating Provider/Extender: Melburn Hake, Lasalle Abee Weeks in Treatment: 6 History of Present Illness HPI Description: 09/05/17-He is seeing an initial evaluation for a left plantar foot ulcer. He has a remote history of left great toe amputation. He states that 4-6 weeks ago he noted callus formation and ulceration. He has not seen primary care regarding this. He is not currently on antibiotic therapy. He does not routinely follow with podiatry. He states diabetic foot wear will arrive early next week. The EHR shows an A1c of 9% approximate 4 months ago but he states he had one and primary care a few weeks ago but does not know the results. He is neuropathic and does not complain of any pain, he is currently wearing crocs. 09/12/17-he is here in follow up evaluation for left plantar foot  ulcer. There is improvement in both appearance and measurement. We will continue with same treatment plan and he will follow up next week. 09/19/17 on evaluation today patient actually appears to be doing excellent in regard to his ulcer on the invitation site of his left great foot plantar aspect. He's been tolerating the dressing changes without complication. In fact with the Prisma and the current measures he has been shown signs  of excellent improvement week by week up to this point. We have been to breeding the wound in this seems to have been of great benefit for him. Fortunately there is no evidence of infection. 09/26/17 on evaluation today patient appears to be doing rather well in regard to his wound. He did not know quite as much improvement this week as compared to last week. Nonetheless he still continues to show signs of improving to some degree. I do believe he may benefit from an offloading shoe. No fevers, chills, nausea, or vomiting noted at this time. 10/03/17 on evaluation today patient actually appears to be doing much better in regard to the amputation site plantar foot ulcer. Overall this appears significantly smaller even compared to previous. He's been tolerating the dressing changes without complication. He did get his diabetic shoes and I did have a look at them they appear to be fairly good. Nonetheless I do think that for the time being I would probably recommend he continue with the offloading shoe that we have been utilizing just due to the fact that with the dressing I don't know that his diabetic shoes are going to work as appropriately as far as not called an additional pressure and irritation to the area in question. He understands. 10/10/17 on evaluation today patient actually appears to be doing very well in regard to his plantar foot ulcer. He has been tolerating the dressing changes without complication. I do feel like he's making signs of good improvement and in  fact of the wound bed appears to be better although it may not be significantly changed in size it appears healthier and I do believe is showing signs of improving. Nonetheless we gonna keep working towards healing as far as that is concerned. There is no evidence of infection. 10/17/17 on evaluation today patient actually appears to be doing poorly in regard to his plantar foot ulcer. Unfortunately other than just the area where the wound is itself there appears to be a blister that communicates with the wound unfortunately. This is more lateral to the wound itself and also to the distal point of the amputation site. There does not appear to be any evidence of cellulitis spreading at the foot but I do believe some of the drainage from the site itself is. When in nature. Nonetheless this blister area I think needs to be removed in order to allow for appropriate healing of the ulcer itself I think that is gonna be difficult for it to heal otherwise. This was discussed with the patient today. Electronic Signature(s) Signed: 10/17/2017 4:58:18 PM By: [name] PA-C Entered By: [name] on 10/17/2017 08:54:48 Raymond [name] ([id number]) -------------------------------------------------------------------------------- Physical Exam Details Patient Name: Raymond [name]. Date of Service: 10/17/2017 8:15 AM Medical Record Number: [id number] Patient Account Number: [id number] Date of Birth/Sex: [birth year] (52 y.o. M) Treating RN: Montey Hora Primary Care Provider: [name] Other Clinician: Referring Provider: [name] Treating Provider/Extender: STONE III, Opal Mckellips Weeks in Treatment: 6 Constitutional Well-nourished and well-hydrated in no acute distress. Respiratory normal breathing without difficulty. Psychiatric this patient is able to make decisions and demonstrates good insight into disease process. Alert and Oriented x 3. pleasant and  cooperative. Notes Currently patient's wound bed actually is still about the same size as what it was previous although there is a significant amount of worsening depth as well is having an area that seems to track to the lateral aspect of his invitation site distally and there is  a blister that communicates with purulent drainage noted as I squeeze on the blistered coming out the wound opening. Nonetheless I do believe this is going to require sharp debridement today to remove the blistered areas that splinted the patient that underneath this am not sure if it will be open wound or just new skin with an overlying blister. Nonetheless once I performed the debridement utilizing scissors and four steps it was revealed that there did was indeed an open wound under this entire region. Obscene this dramatically increase the size of the wound unfortunately but as I explained to the patient with this hiding under there that is one reason why the wound itself probably was not improving as appropriately as what we would've liked. Electronic Signature(s) Signed: 10/17/2017 4:58:18 PM By: [name] PA-C Entered By: [name] on 10/17/2017 08:56:10 Raymond [name] ([id number]) -------------------------------------------------------------------------------- Physician Orders Details Patient Name: Raymond [name]. Date of Service: 10/17/2017 8:15 AM Medical Record Number: [id number] Patient Account Number: [id number] Date of Birth/Sex: 04-28-1965 (52 y.o. 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 o Cleanse wound with mild soap and water Anesthetic (add to Medication List) Wound #1 Left,Distal Metatarsal head first o Topical Lidocaine 4% cream applied to wound bed prior to debridement (In Clinic Only). Primary Wound Dressing Wound #1 Left,Distal Metatarsal head first o Silver Alginate Secondary Dressing Wound #1 Left,Distal Metatarsal head first o Dry Gauze o Conform/Kerlix Dressing Change Frequency Wound #1 Left,Distal Metatarsal head first o Change dressing every other day. o Other: - as needed Follow-up Appointments Wound #1 Left,Distal Metatarsal head first o Return Appointment in 1 week. Edema Control Wound #1 Left,Distal Metatarsal head first o Elevate legs to the level of the heart and pump ankles as often as possible Off-Loading Wound #1 Left,Distal Metatarsal head first o Open toe surgical shoe with peg assist. Raymond [name] ([id number]) Additional Orders / Instructions Wound #1 Left,Distal Metatarsal head first o Increase protein intake. Laboratory o Bacteria identified in Wound by Culture (MICRO) oooo LOINC Code: 9937-1 oooo Convenience Name: Wound culture routine Patient Medications Allergies: No Known Allergies Notifications Medication Indication Start End Bactrim DS 10/17/2017 DOSE 1 - oral 800 mg-160 mg tablet - 1 tablet oral taken 2 times a day for 10 days Electronic Signature(s) Signed: 10/17/2017 8:58:11 AM By: [name] PA-C Entered By: [name] on 10/17/2017 08:58:10 Raymond [name] ([id number]) -------------------------------------------------------------------------------- Problem List Details Patient Name: Raymond [name]. Date of Service: 10/17/2017 8:15 AM Medical Record Number: [id number] Patient Account Number: [id number] Date of Birth/Sex: 1965-06-01 (52 y.o. M) Treating RN: Montey Hora Primary Care  Provider: [name] Other Clinician: Referring Provider: [name] Treating Provider/Extender: Melburn Hake, Isolde Skaff Weeks in Treatment: 6 Active Problems ICD-10 Evaluated Encounter Code Description Active Date Today Diagnosis L97.522 Non-pressure chronic ulcer of other part of left foot with fat 09/05/2017 No Yes layer exposed E11.621 Type 2 diabetes mellitus with foot ulcer 09/05/2017 No Yes Inactive Problems Resolved Problems Electronic Signature(s) Signed: 10/17/2017 4:58:18 PM By: [name] PA-C Entered By: [name] on 10/17/2017 08:10:08 Raymond [name] ([id number]) --------------------------------------------------------------------------------  Progress Note Details Patient Name: Raymond [name], Raymond [name]. Date of Service: 10/17/2017 8:15 AM Medical Record Number: [id number] Patient Account Number: [id number] Date of Birth/Sex: [birth year] (52 y.o. M) Treating RN: Montey Hora Primary Care Provider: [name] Other Clinician: Referring Provider: [name] Treating Provider/Extender: Melburn Hake, Jaylina Ramdass Weeks in Treatment: 6 Subjective Chief Complaint Information obtained from Patient left foot History of Present Illness (HPI) 09/05/17-He is seeing an initial evaluation for a left plantar foot ulcer. He has a remote history of left great toe amputation. He states that 4-6 weeks ago he noted callus formation and ulceration. He has not seen primary care regarding this. He is not currently on antibiotic therapy. He does not routinely follow with podiatry. He states diabetic foot wear will arrive early next week. The EHR shows an A1c of 9% approximate 4 months ago but he states he had one and primary care a few weeks ago but does not know the results. He is neuropathic and does not complain of any pain, he is currently wearing crocs. 09/12/17-he is here in follow up evaluation for left plantar foot ulcer. There is improvement in both appearance  and measurement. We will continue with same treatment plan and he will follow up next week. 09/19/17 on evaluation today patient actually appears to be doing excellent in regard to his ulcer on the invitation site of his left great foot plantar aspect. He's been tolerating the dressing changes without complication. In fact with the Prisma and the current measures he has been shown signs of excellent improvement week by week up to this point. We have been to breeding the wound in this seems to have been of great benefit for him. Fortunately there is no evidence of infection. 09/26/17 on evaluation today patient appears to be doing rather well in regard to his wound. He did not know quite as much improvement this week as compared to last week. Nonetheless he still continues to show signs of improving to some degree. I do believe he may benefit from an offloading shoe. No fevers, chills, nausea, or vomiting noted at this time. 10/03/17 on evaluation today patient actually appears to be doing much better in regard to the amputation site plantar foot ulcer. Overall this appears significantly smaller even compared to previous. He's been tolerating the dressing changes without complication. He did get his diabetic shoes and I did have a look at them they appear to be fairly good. Nonetheless I do think that for the time being I would probably recommend he continue with the offloading shoe that we have been utilizing just due to the fact that with the dressing I don't know that his diabetic shoes are going to work as appropriately as far as not called an additional pressure and irritation to the area in question. He understands. 10/10/17 on evaluation today patient actually appears to be doing very well in regard to his plantar foot ulcer. He has been tolerating the dressing changes without complication. I do feel like he's making signs of good improvement and in fact of the wound bed appears to be better  although it may not be significantly changed in size it appears healthier and I do believe is showing signs of improving. Nonetheless we gonna keep working towards healing as far as that is concerned. There is no evidence of infection. 10/17/17 on evaluation today patient actually appears to be doing poorly in regard to his plantar foot ulcer. Unfortunately other than just the area where the wound is itself  there appears to be a blister that communicates with the wound unfortunately. This is more lateral to the wound itself and also to the distal point of the amputation site. There does not appear to be any evidence of cellulitis spreading at the foot but I do believe some of the drainage from the site itself is. When in nature. Nonetheless this blister area I think needs to be removed in order to allow for appropriate healing of the ulcer itself I think that is gonna be difficult for it to heal otherwise. This was discussed with the patient today. Patient History Information obtained from Patient. Raymond [name], Raymond [name] ([id number]) Family History Diabetes - Mother,Maternal Grandparents, Hypertension - Father, Stroke - Father, No family history of Cancer, Heart Disease, Hereditary Spherocytosis, Kidney Disease, Lung Disease, Seizures, Thyroid Problems, Tuberculosis. Social History Never smoker, Marital Status - Divorced, Alcohol Use - Rarely, Drug Use - No History, Caffeine Use - Never. Review of Systems (ROS) Constitutional Symptoms (General Health) Denies complaints or symptoms of Fever, Chills. Respiratory The patient has no complaints or symptoms. Cardiovascular The patient has no complaints or symptoms. Psychiatric The patient has no complaints or symptoms. Objective Constitutional Well-nourished and well-hydrated in no acute distress. Vitals Time Taken: 8:15 AM, Height: 74 in, Weight: 381 lbs, BMI: 48.9, Temperature: 98.9 F, Pulse: 73 bpm, Respiratory Rate: 18 breaths/min,  Blood Pressure: 136/62 mmHg. Respiratory normal breathing without difficulty. Psychiatric this patient is able to make decisions and demonstrates good insight into disease process. Alert and Oriented x 3. pleasant and cooperative. General Notes: Currently patient's wound bed actually is still about the same size as what it was previous although there is a significant amount of worsening depth as well is having an area that seems to track to the lateral aspect of his invitation site distally and there is a blister that communicates with purulent drainage noted as I squeeze on the blistered coming out the wound opening. Nonetheless I do believe this is going to require sharp debridement today to remove the blistered areas that splinted the patient that underneath this am not sure if it will be open wound or just new skin with an overlying blister. Nonetheless once I performed the debridement utilizing scissors and four steps it was revealed that there did was indeed an open wound under this entire region. Obscene this dramatically increase the size of the wound unfortunately but as I explained to the patient with this hiding under there that is one reason why the wound itself probably was not improving as appropriately as what we would've liked. Integumentary (Hair, Skin) Wound #1 status is Open. Original cause of wound was Gradually Appeared. The wound is located on the Left,Distal Metatarsal head first. The wound measures 0.3cm length x 0.3cm width x 0.9cm depth; 0.071cm^2 area and 0.064cm^3 volume. There is Fat Layer (Subcutaneous Tissue) Exposed exposed. There is no tunneling noted, however, there is undermining starting at 12:00 and ending at 12:00 with a maximum distance of 0.7cm. There is a small amount of sanguinous drainage noted. The wound margin is indistinct and nonvisible. There is no granulation within the wound bed. There is no ELVIE, MAINES ([id number]) necrotic tissue  within the wound bed. The periwound skin appearance exhibited: Callus, Maceration, Hemosiderin Staining. The periwound skin appearance did not exhibit: Crepitus, Excoriation, Induration, Rash, Scarring, Dry/Scaly, Atrophie Blanche, Cyanosis, Ecchymosis, Mottled, Pallor, Rubor, Erythema. Periwound temperature was noted as No Abnormality. General Notes: unable to visualize wound bed Assessment Active Problems ICD-10 Non-pressure chronic ulcer  of other part of left foot with fat layer exposed Type 2 diabetes mellitus with foot ulcer Procedures Wound #1 Pre-procedure diagnosis of Wound #1 is a Diabetic Wound/Ulcer of the Lower Extremity located on the Left,Distal Metatarsal head first .Severity of Tissue Pre Debridement is: Fat layer exposed. There was a Excisional Skin/Subcutaneous Tissue Debridement with a total area of 0.09 sq cm performed by STONE III, Nikki Rusnak E., PA-C. With the following instrument(s): Curette, Forceps, and Scissors to remove Viable and Non-Viable tissue/material. Material removed includes Subcutaneous Tissue, Slough, Skin: Dermis, and Skin: Epidermis after achieving pain control using Lidocaine 4% Topical Solution. 1 specimen was taken by a Swab and sent to the lab per facility protocol. A time out was conducted at 08:44, prior to the start of the procedure. A Minimum amount of bleeding was controlled with Pressure. The procedure was tolerated well with a pain level of 0 throughout and a pain level of 0 following the procedure. Post Debridement Measurements: 3.6cm length x 1.8cm width x 0.4cm depth; 2.036cm^3 volume. Character of Wound/Ulcer Post Debridement is improved. Severity of Tissue Post Debridement is: Fat layer exposed. Post procedure Diagnosis Wound #1: Same as Pre-Procedure Plan Wound Cleansing: Wound #1 Left,Distal Metatarsal head first: Clean wound with Normal Saline. Cleanse wound with mild soap and water Anesthetic (add to Medication List): Wound #1  Left,Distal Metatarsal head first: Topical Lidocaine 4% cream applied to wound bed prior to debridement (In Clinic Only). Primary Wound Dressing: Wound #1 Left,Distal Metatarsal head first: Silver Alginate Secondary Dressing: Wound #1 Left,Distal Metatarsal head first: Dry Gauze Conform/Kerlix Dressing Change Frequency: Raymond [name], Raymond [name] ([id number]) Wound #1 Left,Distal Metatarsal head first: Change dressing every other day. Other: - as needed Follow-up Appointments: Wound #1 Left,Distal Metatarsal head first: Return Appointment in 1 week. Edema Control: Wound #1 Left,Distal Metatarsal head first: Elevate legs to the level of the heart and pump ankles as often as possible Off-Loading: Wound #1 Left,Distal Metatarsal head first: Open toe surgical shoe with peg assist. Additional Orders / Instructions: Wound #1 Left,Distal Metatarsal head first: Increase protein intake. Laboratory ordered were: Wound culture routine The following medication(s) was prescribed: Bactrim DS oral 800 mg-160 mg tablet 1 1 tablet oral taken 2 times a day for 10 days starting 10/17/2017 At this time my suggestion for the patient is going to be that we continue utilizing silver so in order to help with drainage control as well as bacterial load. I did perform a culture of the blister area once this was cleaned well and then obtain the sample to sin for culture to see if there's anything that shows. I did avoid the edges of the wound during the culture sample. Subsequently we're gonna see were things stand at follow-up. I'm hopeful this will actually help them to heal faster now that we're able to get this area taken care of. Fortunately there's no evidence of infection spreading further up the foot which is great news. I am placing him on Bactrim DS. There are no interactions and this is a good broad spectrum antibiotic. We will see were the wound culture shows and make any adjustments as needed at that  point. Please see above for specific wound care orders. We will see patient for re-evaluation in 1 week(s) here in the clinic. If anything worsens or changes patient will contact our office for additional recommendations. Electronic Signature(s) Signed: 10/17/2017 4:58:18 PM By: [name] PA-C Entered By: [name] on 10/17/2017 08:58:40 Raymond [name] ([id number]) -------------------------------------------------------------------------------- ROS/PFSH  Details Patient Name: Raymond [name], Raymond [name]. Date of Service: 10/17/2017 8:15 AM Medical Record Number: [id number] Patient Account Number: [id number] Date of Birth/Sex: [birth year] (52 y.o. M) Treating RN: Montey Hora Primary Care Provider: [name] Other Clinician: Referring Provider: [name] Treating Provider/Extender: Melburn Hake, Ricki Clack Weeks in Treatment: 6 Information Obtained From Patient Wound History Do you currently have one or more open woundso Yes How many open wounds do you currently haveo 1 Approximately how long have you had your woundso 3 weeks How have you been treating your wound(s) until nowo clened and bandaid Has your wound(s) ever healed and then re-openedo No Have you had any lab work done in the past montho No Have you tested positive for an antibiotic resistant organism (MRSA, VRE)o No Have you tested positive for osteomyelitis (bone infection)o No Have you had any tests for circulation on your legso No Constitutional Symptoms (General Health) Complaints and Symptoms: Negative for: Fever; Chills Respiratory Complaints and Symptoms: No Complaints or Symptoms Cardiovascular Complaints and Symptoms: No Complaints or Symptoms Endocrine Medical History: Positive for: Type II Diabetes Negative for: Type I Diabetes Time with diabetes: 15 years Treated with: Insulin, Oral agents Blood sugar tested every day: Yes Tested : PRN Psychiatric Complaints and Symptoms: No  Complaints or Symptoms Immunizations Pneumococcal Vaccine: Received Pneumococcal Vaccination: No Tetanus Vaccine: Last tetanus shot: 09/05/2016 Raymond [name] ([id number]) Implantable Devices Family and Social History Cancer: No; Diabetes: Yes - Mother,Maternal Grandparents; Heart Disease: No; Hereditary Spherocytosis: No; Hypertension: Yes - Father; Kidney Disease: No; Lung Disease: No; Seizures: No; Stroke: Yes - Father; Thyroid Problems: No; Tuberculosis: No; Never smoker; Marital Status - Divorced; Alcohol Use: Rarely; Drug Use: No History; Caffeine Use: Never; Financial Concerns: No; Food, Clothing or Shelter Needs: No; Support System Lacking: No; Transportation Concerns: No; Advanced Directives: Yes - glenda (Not Provided); Patient does not want information on Advanced Directives; Do not resuscitate: No Physician Affirmation I have reviewed and agree with the above information. Electronic Signature(s) Signed: 10/17/2017 4:58:18 PM By: [name] PA-C Signed: 10/17/2017 5:04:25 PM By: Montey Hora Entered By: [name] on 10/17/2017 08:55:04 Raymond [name] ([id number]) -------------------------------------------------------------------------------- SuperBill Details Patient Name: Raymond [name]. Date of Service: 10/17/2017 Medical Record Number: [id number] Patient Account Number: [id number] Date of Birth/Sex: 19-Feb-1965 (52 y.o. M) Treating RN: Montey Hora Primary Care Provider: [name] Other Clinician: Referring Provider: [name] Treating Provider/Extender: Melburn Hake, Levy Wellman Weeks in Treatment: 6 Diagnosis Coding ICD-10 Codes Code Description [phone] Non-pressure chronic ulcer of other part of left foot with fat layer exposed E11.621 Type 2 diabetes mellitus with foot ulcer Facility Procedures CPT4 Code: 04888916 Description: 94503 - DEB SUBQ TISSUE 20 SQ CM/< ICD-10 Diagnosis Description L97.522 Non-pressure chronic ulcer  of other part of left foot with fat Modifier: layer exposed Quantity: 1 Physician Procedures CPT4 Code: 8882800 Description: 34917 - WC PHYS SUBQ TISS 20 SQ CM ICD-10 Diagnosis Description L97.522 Non-pressure chronic ulcer of other part of left foot with fat Modifier: layer exposed Quantity: 1 Electronic Signature(s) Signed: 10/17/2017 4:58:18 PM By: [name] PA-C Entered By: [name] on 10/17/2017 08:58:47

## 2017-10-19 NOTE — Progress Notes (Signed)
Raymond, [name] ([id number]) Visit Report for 10/17/2017 Arrival Information Details Patient Name: Raymond [name], Raymond [name]. Date of Service: 10/17/2017 8:15 AM Medical Record Number: [id number] Patient Account Number: [id number] Date of Birth/Sex: 07-18-1965 (52 y.o. M) Treating RN: Montey Hora Primary Care Clarine Elrod: [name] Other Clinician: Referring Aluna Whiston: [name] Treating Tynan Boesel/Extender: Melburn Hake, HOYT Weeks in Treatment: 6 Visit Information History Since Last Visit Added or deleted any medications: No Patient Arrived: Ambulatory Any new allergies or adverse reactions: No Arrival Time: 08:11 Had a fall or experienced change in No Accompanied By: self activities of daily living that may affect Transfer Assistance: None risk of falls: Patient Identification Verified: Yes Signs or symptoms of abuse/neglect since last visito No Secondary Verification Process Completed: Yes Hospitalized since last visit: No Patient Has Alerts: Yes Implantable device outside of the clinic excluding No Patient Alerts: DMII cellular tissue based products placed in the center since last visit: Has Dressing in Place as Prescribed: Yes Pain Present Now: No Electronic Signature(s) Signed: 10/17/2017 2:31:41 PM By: Lorine Bears RCP, RRT, CHT Entered By: Becky Sax, Amado Nash on 10/17/2017 08:14:09 Raymond [name] ([id number]) -------------------------------------------------------------------------------- Lower Extremity Assessment Details Patient Name: Raymond [name]. Date of Service: 10/17/2017 8:15 AM Medical Record Number: [id number] Patient Account Number: [id number] Date of Birth/Sex: 05-14-1965 (52 y.o. M) Treating RN: Cornell Barman Primary Care Arvil Utz: [name] Other Clinician: Referring Teala Daffron: [name] Treating Presley Summerlin/Extender: Melburn Hake, HOYT Weeks in Treatment: 6 Vascular  Assessment Claudication: Claudication Assessment [Left:None] Pulses: Dorsalis Pedis Palpable: [Left:Yes] Posterior Tibial Extremity colors, hair growth, and conditions: Extremity Color: [Left:Normal] Hair Growth on Extremity: [Left:No] Temperature of Extremity: [Left:Warm] Capillary Refill: [Left:< 3 seconds] Toe Nail Assessment Left: Right: Thick: Yes Discolored: Yes Deformed: Yes Improper Length and Hygiene: No Electronic Signature(s) Signed: 10/17/2017 8:42:56 AM By: Gretta Cool, BSN, RN, CWS, Kim RN, BSN Entered By: Gretta Cool, BSN, RN, CWS, Kim on 10/17/2017 08:25:15 Raymond [name] ([id number]) -------------------------------------------------------------------------------- Multi Wound Chart Details Patient Name: Raymond [name]. Date of Service: 10/17/2017 8:15 AM Medical Record Number: [id number] Patient Account Number: [id number] Date of Birth/Sex: 1965-11-17 (52 y.o. M) Treating RN: Montey Hora Primary Care Drayden Lukas: [name] Other Clinician: Referring Morine Kohlman: [name] Treating Legion Discher/Extender: STONE III, HOYT Weeks in Treatment: 6 Vital Signs Height(in): 74 Pulse(bpm): 73 Weight(lbs): 381 Blood Pressure(mmHg): 136/62 Body Mass Index(BMI): 49 Temperature(F): 98.9 Respiratory Rate 18 (breaths/min): Photos: [1:No Photos] [N/A:N/A] Wound Location: [1:Left Metatarsal head first - Distal] [N/A:N/A] Wounding Event: [1:Gradually Appeared] [N/A:N/A] Primary Etiology: [1:Diabetic Wound/Ulcer of the Lower Extremity] [N/A:N/A] Comorbid History: [1:Type II Diabetes] [N/A:N/A] Date Acquired: [1:07/11/2017] [N/A:N/A] Weeks of Treatment: [1:6] [N/A:N/A] Wound Status: [1:Open] [N/A:N/A] Measurements L x W x D [1:0.3x0.3x0.9] [N/A:N/A] (cm) Area (cm) : [1:0.071] [N/A:N/A] Volume (cm) : [1:0.064] [N/A:N/A] % Reduction in Area: [1:90.00%] [N/A:N/A] % Reduction in Volume: [1:54.60%] [N/A:N/A] Starting Position 1 [1:12] (o'clock): Ending  Position 1 [1:12] (o'clock): Maximum Distance 1 (cm): [1:0.7] Undermining: [1:Yes] [N/A:N/A] Classification: [1:Grade 2] [N/A:N/A] Exudate Amount: [1:Small] [N/A:N/A] Exudate Type: [1:Sanguinous] [N/A:N/A] Exudate Color: [1:red] [N/A:N/A] Wound Margin: [1:Indistinct, nonvisible] [N/A:N/A] Granulation Amount: [1:None Present (0%)] [N/A:N/A] Necrotic Amount: [1:None Present (0%)] [N/A:N/A] Exposed Structures: [1:Fat Layer (Subcutaneous Tissue) Exposed: Yes Fascia: No Tendon: No Muscle: No Joint: No Bone: No] [N/A:N/A] Epithelialization: [1:None] [N/A:N/A] Periwound Skin Texture: Callus: Yes N/A N/A Excoriation: No Induration: No Crepitus: No Rash: No Scarring: No Periwound Skin Moisture: Maceration: Yes N/A N/A Dry/Scaly: No Periwound Skin Color: Hemosiderin Staining: Yes N/A N/A Atrophie Blanche: No Cyanosis: No Ecchymosis: No Erythema:  No Mottled: No Pallor: No Rubor: No Temperature: No Abnormality N/A N/A Tenderness on Palpation: No N/A N/A Wound Preparation: Ulcer Cleansing: N/A N/A Rinsed/Irrigated with Saline Topical Anesthetic Applied: Other: lidocaine 4% Assessment Notes: unable to visualize wound bed N/A N/A Treatment Notes Electronic Signature(s) Signed: 10/17/2017 5:04:25 PM By: Montey Hora Entered By: Montey Hora on 10/17/2017 08:43:26 Raymond [name] ([id number]) -------------------------------------------------------------------------------- [place] Details Patient Name: Raymond [name]. Date of Service: 10/17/2017 8:15 AM Medical Record Number: [id number] Patient Account Number: [id number] Date of Birth/Sex: 1965-05-25 (52 y.o. M) Treating RN: Montey Hora Primary Care Trudy Kory: [name] Other Clinician: Referring Khrystian Schauf: [name] Treating Dorothymae Maciver/Extender: Melburn Hake, HOYT Weeks in Treatment: 6 Active Inactive ` Abuse / Safety / Falls / Self Care Management Nursing Diagnoses: Potential  for falls Goals: Patient will not experience any injury related to falls Date Initiated: 09/05/2017 Target Resolution Date: 12/09/2017 Goal Status: Active Interventions: Assess fall risk on admission and as needed Assess: immobility, friction, shearing, incontinence upon admission and as needed Assess impairment of mobility on admission and as needed per policy Assess personal safety and home safety (as indicated) on admission and as needed Assess self care needs on admission and as needed Notes: ` Nutrition Nursing Diagnoses: Imbalanced nutrition Impaired glucose control: actual or potential Potential for alteratiion in Nutrition/Potential for imbalanced nutrition Goals: Patient/caregiver agrees to and verbalizes understanding of need to use nutritional supplements and/or vitamins as prescribed Date Initiated: 09/05/2017 Target Resolution Date: 01/06/2018 Goal Status: Active Patient/caregiver will maintain therapeutic glucose control Date Initiated: 09/05/2017 Target Resolution Date: 01/06/2018 Goal Status: Active Interventions: Assess patient nutrition upon admission and as needed per policy Provide education on elevated blood sugars and impact on wound healing Provide education on nutrition Notes: DINESH, ULYSSE ([id number]) Orientation to the Wound Care Program Nursing Diagnoses: Knowledge deficit related to the wound healing center program Goals: Patient/caregiver will verbalize understanding of the [place] Date Initiated: 09/05/2017 Target Resolution Date: 10/07/2017 Goal Status: Active Interventions: Provide education on orientation to the wound center Notes: ` Wound/Skin Impairment Nursing Diagnoses: Impaired tissue integrity Knowledge deficit related to ulceration/compromised skin integrity Goals: Ulcer/skin breakdown will have a volume reduction of 80% by week 12 Date Initiated: 09/05/2017 Target Resolution Date: 01/06/2018 Goal Status:  Active Interventions: Assess patient/caregiver ability to perform ulcer/skin care regimen upon admission and as needed Assess ulceration(s) every visit Notes: Electronic Signature(s) Signed: 10/17/2017 5:04:25 PM By: Montey Hora Entered By: Montey Hora on 10/17/2017 08:43:21 Raymond [name] ([id number]) -------------------------------------------------------------------------------- Pain Assessment Details Patient Name: Raymond [name]. Date of Service: 10/17/2017 8:15 AM Medical Record Number: [id number] Patient Account Number: [id number] Date of Birth/Sex: 1965/03/31 (52 y.o. M) Treating RN: Montey Hora Primary Care Billiejean Schimek: [name] Other Clinician: Referring Mary-Ann Pennella: [name] Treating Eldora Napp/Extender: Melburn Hake, HOYT Weeks in Treatment: 6 Active Problems Location of Pain Severity and Description of Pain Patient Has Paino No Site Locations Pain Management and Medication Current Pain Management: Electronic Signature(s) Signed: 10/17/2017 2:31:41 PM By: Lorine Bears RCP, RRT, CHT Signed: 10/17/2017 5:04:25 PM By: Montey Hora Entered By: Lorine Bears on 10/17/2017 08:14:16 Raymond [name] ([id number]) -------------------------------------------------------------------------------- Wound Assessment Details Patient Name: Raymond [name]. Date of Service: 10/17/2017 8:15 AM Medical Record Number: [id number] Patient Account Number: [id number] Date of Birth/Sex: 11/05/1965 (53 y.o. M) Treating RN: Cornell Barman Primary Care Kelsie Zaborowski: [name] Other Clinician: Referring Jaysean Manville: [name] Treating Geriann Lafont/Extender: STONE III, HOYT Weeks in Treatment: 6 Wound Status Wound Number: 1 Primary  Etiology: Diabetic Wound/Ulcer of the Lower Extremity Wound Location: Left Metatarsal head first - Distal Wound Status: Open Wounding Event: Gradually Appeared Comorbid Type II Diabetes Date  Acquired: 07/11/2017 History: Weeks Of Treatment: 6 Clustered Wound: No Photos Photo Uploaded By: Gretta Cool, BSN, RN, CWS, Kim on 10/17/2017 18:06:52 Wound Measurements Length: (cm) 0.3 Width: (cm) 0.3 Depth: (cm) 0.9 Area: (cm) 0.071 Volume: (cm) 0.064 % Reduction in Area: 90% % Reduction in Volume: 54.6% Epithelialization: None Tunneling: No Undermining: Yes Starting Position (o'clock): 12 Ending Position (o'clock): 12 Maximum Distance: (cm) 0.7 Wound Description Classification: Grade 2 Wound Margin: Indistinct, nonvisible Exudate Amount: Small Exudate Type: Sanguinous Exudate Color: red Foul Odor After Cleansing: No Slough/Fibrino No Wound Bed Granulation Amount: None Present (0%) Exposed Structure Necrotic Amount: None Present (0%) Fascia Exposed: No Fat Layer (Subcutaneous Tissue) Exposed: Yes Tendon Exposed: No Muscle Exposed: No SAIGE, BUSBY ([id number]) Joint Exposed: No Bone Exposed: No Periwound Skin Texture Texture Color No Abnormalities Noted: No No Abnormalities Noted: No Callus: Yes Atrophie Blanche: No Crepitus: No Cyanosis: No Excoriation: No Ecchymosis: No Induration: No Erythema: No Rash: No Hemosiderin Staining: Yes Scarring: No Mottled: No Pallor: No Moisture Rubor: No No Abnormalities Noted: No Dry / Scaly: No Temperature / Pain Maceration: Yes Temperature: No Abnormality Wound Preparation Ulcer Cleansing: Rinsed/Irrigated with Saline Topical Anesthetic Applied: Other: lidocaine 4%, Assessment Notes unable to visualize wound bed Electronic Signature(s) Signed: 10/17/2017 8:42:56 AM By: Gretta Cool, BSN, RN, CWS, Kim RN, BSN Entered By: Gretta Cool, BSN, RN, CWS, Kim on 10/17/2017 08:24:36 Raymond [name] ([id number]) -------------------------------------------------------------------------------- Vitals Details Patient Name: Raymond [name]. Date of Service: 10/17/2017 8:15 AM Medical Record Number:  [id number] Patient Account Number: [id number] Date of Birth/Sex: [birth year] (52 y.o. M) Treating RN: Montey Hora Primary Care Early Steel: [name] Other Clinician: Referring Zaydah Nawabi: [name] Treating Adyline Huberty/Extender: STONE III, HOYT Weeks in Treatment: 6 Vital Signs Time Taken: 08:15 Temperature (F): 98.9 Height (in): 74 Pulse (bpm): 73 Weight (lbs): 381 Respiratory Rate (breaths/min): 18 Body Mass Index (BMI): 48.9 Blood Pressure (mmHg): 136/62 Reference Range: 80 - 120 mg / dl Electronic Signature(s) Signed: 10/17/2017 2:31:41 PM By: Lorine Bears RCP, RRT, CHT Entered By: Becky Sax, Amado Nash on 10/17/2017 08:16:27

## 2017-10-24 ENCOUNTER — Encounter: Payer: Medicare PPO | Admitting: Physician Assistant

## 2017-10-24 DIAGNOSIS — E11621 Type 2 diabetes mellitus with foot ulcer: Secondary | ICD-10-CM | POA: Diagnosis not present

## 2017-10-26 NOTE — Progress Notes (Signed)
TERIQUE, KAWABATA ([id number]) Visit Report for 10/24/2017 Chief Complaint Document Details Patient Name: Raymond [name], Raymond [name]. Date of Service: 10/24/2017 9:15 AM Medical Record Number: [id number] Patient Account Number: [id number] Date of Birth/Sex: [birth year] (52 y.o. M) Treating RN: Montey Hora Primary Care Provider: [name] Other Clinician: Referring Provider: [name] Treating Provider/Extender: Melburn Hake, HOYT Weeks in Treatment: 7 Information Obtained from: Patient Chief Complaint left foot Electronic Signature(s) Signed: 10/24/2017 5:36:35 PM By: [name] PA-C Entered By: [name] on 10/24/2017 09:09:02 Raymond [name] ([id number]) -------------------------------------------------------------------------------- Debridement Details Patient Name: Raymond [name]. Date of Service: 10/24/2017 9:15 AM Medical Record Number: [id number] Patient Account Number: [id number] Date of Birth/Sex: [birth year] (52 y.o. M) Treating RN: Montey Hora Primary Care Provider: [name] Other Clinician: Referring Provider: [name] Treating Provider/Extender: STONE III, HOYT Weeks in Treatment: 7 Debridement Performed for Wound #1 Left,Distal Metatarsal head first Assessment: Performed By: Physician STONE III, HOYT E., PA-C Debridement Type: Debridement Severity of Tissue Pre Fat layer exposed Debridement: Level of Consciousness (Pre- Awake and Alert procedure): Pre-procedure Verification/Time Yes - 09:29 Out Taken: Start Time: 09:29 Pain Control: Lidocaine 4% Topical Solution Total Area Debrided (L x W): 1.7 (cm) x 0.8 (cm) = 1.36 (cm) Tissue and other material Viable, Non-Viable, Slough, Subcutaneous, Slough debrided: Level: Skin/Subcutaneous Tissue Debridement Description: Excisional Instrument: Curette Bleeding: Minimum Hemostasis Achieved: Pressure End Time: 09:31 Procedural Pain: 0 Post Procedural Pain:  0 Response to Treatment: Procedure was tolerated well Level of Consciousness Awake and Alert (Post-procedure): Post Debridement Measurements of Total Wound Length: (cm) 1.7 Width: (cm) 0.8 Depth: (cm) 0.4 Volume: (cm) 0.427 Character of Wound/Ulcer Post Debridement: Improved Severity of Tissue Post Debridement: Fat layer exposed Post Procedure Diagnosis Same as Pre-procedure Electronic Signature(s) Signed: 10/24/2017 5:36:35 PM By: [name] PA-C Signed: 10/25/2017 5:04:30 PM By: Montey Hora Entered By: Montey Hora on 10/24/2017 09:30:47 Raymond [name] ([id number]) -------------------------------------------------------------------------------- HPI Details Patient Name: Raymond [name]. Date of Service: 10/24/2017 9:15 AM Medical Record Number: [id number] Patient Account Number: [id number] Date of Birth/Sex: 1965/10/16 (52 y.o. M) Treating RN: Montey Hora Primary Care Provider: [name] Other Clinician: Referring Provider: [name] Treating Provider/Extender: Melburn Hake, HOYT Weeks in Treatment: 7 History of Present Illness HPI Description: 09/05/17-He is seeing an initial evaluation for a left plantar foot ulcer. He has a remote history of left great toe amputation. He states that 4-6 weeks ago he noted callus formation and ulceration. He has not seen primary care regarding this. He is not currently on antibiotic therapy. He does not routinely follow with podiatry. He states diabetic foot wear will arrive early next week. The EHR shows an A1c of 9% approximate 4 months ago but he states he had one and primary care a few weeks ago but does not know the results. He is neuropathic and does not complain of any pain, he is currently wearing crocs. 09/12/17-he is here in follow up evaluation for left plantar foot ulcer. There is improvement in both appearance and measurement. We will continue with same treatment plan and he will follow up next  week. 09/19/17 on evaluation today patient actually appears to be doing excellent in regard to his ulcer on the invitation site of his left great foot plantar aspect. He's been tolerating the dressing changes without complication. In fact with the Prisma and the current measures he has been shown signs of excellent improvement week by week up to this point. We have been to breeding the wound in  this seems to have been of great benefit for him. Fortunately there is no evidence of infection. 09/26/17 on evaluation today patient appears to be doing rather well in regard to his wound. He did not know quite as much improvement this week as compared to last week. Nonetheless he still continues to show signs of improving to some degree. I do believe he may benefit from an offloading shoe. No fevers, chills, nausea, or vomiting noted at this time. 10/03/17 on evaluation today patient actually appears to be doing much better in regard to the amputation site plantar foot ulcer. Overall this appears significantly smaller even compared to previous. He's been tolerating the dressing changes without complication. He did get his diabetic shoes and I did have a look at them they appear to be fairly good. Nonetheless I do think that for the time being I would probably recommend he continue with the offloading shoe that we have been utilizing just due to the fact that with the dressing I don't know that his diabetic shoes are going to work as appropriately as far as not called an additional pressure and irritation to the area in question. He understands. 10/10/17 on evaluation today patient actually appears to be doing very well in regard to his plantar foot ulcer. He has been tolerating the dressing changes without complication. I do feel like he's making signs of good improvement and in fact of the wound bed appears to be better although it may not be significantly changed in size it appears healthier and I do believe  is showing signs of improving. Nonetheless we gonna keep working towards healing as far as that is concerned. There is no evidence of infection. 10/17/17 on evaluation today patient actually appears to be doing poorly in regard to his plantar foot ulcer. Unfortunately other than just the area where the wound is itself there appears to be a blister that communicates with the wound unfortunately. This is more lateral to the wound itself and also to the distal point of the amputation site. There does not appear to be any evidence of cellulitis spreading at the foot but I do believe some of the drainage from the site itself is. When in nature. Nonetheless this blister area I think needs to be removed in order to allow for appropriate healing of the ulcer itself I think that is gonna be difficult for it to heal otherwise. This was discussed with the patient today. 10/24/17 on evaluation today patient actually appears to be doing better compared to last week even post debridement. He has shown signs of improvement he did test positive for Staphylococcus aureus. With that being said he notes that he's not have any discomfort and in general he does feel like things seem to been doing better. Fortunately there's no additional blistering and no evidence of remaining infection at this time. No fevers, chills, nausea, or vomiting noted at this time. He has three days of antibiotic left. Electronic Signature(s) Signed: 10/24/2017 5:36:35 PM By: [name] PA-C Entered By: [name] on 10/24/2017 09:34:39 Raymond [name] ([id number]) Raymond [name] ([id number]) -------------------------------------------------------------------------------- Physical Exam Details Patient Name: Raymond [name]. Date of Service: 10/24/2017 9:15 AM Medical Record Number: [id number] Patient Account Number: [id number] Date of Birth/Sex: [birth year] (52 y.o. M) Treating RN: Montey Hora Primary Care  Provider: [name] Other Clinician: Referring Provider: [name] Treating Provider/Extender: STONE III, HOYT Weeks in Treatment: 7 Constitutional Well-nourished and well-hydrated in no acute distress. Respiratory normal breathing  without difficulty. clear to auscultation bilaterally. Cardiovascular regular rate and rhythm with normal S1, S2. Psychiatric this patient is able to make decisions and demonstrates good insight into disease process. Alert and Oriented x 3. pleasant and cooperative. Notes Patient's wound bed currently did have minimal amounts of slough noted on the surface of the wound most of this was easily cleaned away just with saline and gauze. The remaining that I did have to perform sharp debridement on was minimal and patient tolerated this without complication. Electronic Signature(s) Signed: 10/24/2017 5:36:35 PM By: [name] PA-C Entered By: [name] on 10/24/2017 09:38:42 Raymond [name] ([id number]) -------------------------------------------------------------------------------- Physician Orders Details Patient Name: Raymond [name]. Date of Service: 10/24/2017 9:15 AM Medical Record Number: [id number] Patient Account Number: [id number] Date of Birth/Sex: 08-19-1965 (52 y.o. 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 o Cleanse wound with mild soap and water Anesthetic (add to Medication List) Wound #1 Left,Distal Metatarsal head first o Topical Lidocaine 4% cream  applied to wound bed prior to debridement (In Clinic Only). Primary Wound Dressing Wound #1 Left,Distal Metatarsal head first o Silver Alginate Secondary Dressing Wound #1 Left,Distal Metatarsal head first o Dry Gauze o Conform/Kerlix Dressing Change Frequency Wound #1 Left,Distal Metatarsal head first o Change dressing every other day. o Other: - as needed Follow-up Appointments Wound #1 Left,Distal Metatarsal head first o Return Appointment in 1 week. Edema Control Wound #1 Left,Distal Metatarsal head first o Elevate legs to the level of the heart and pump ankles as often as possible Off-Loading Wound #1 Left,Distal Metatarsal head first o Open toe surgical shoe with peg assist. Raymond [name] ([id number]) Additional Orders / Instructions Wound #1 Left,Distal Metatarsal head first o Increase protein intake. Electronic Signature(s) Signed: 10/24/2017 5:36:35 PM By: [name] PA-C Signed: 10/25/2017 5:04:30 PM By: Montey Hora Entered By: Montey Hora on 10/24/2017 09:31:11 Raymond [name] ([id number]) -------------------------------------------------------------------------------- Problem List Details Patient Name: Raymond [name]. Date of Service: 10/24/2017 9:15 AM Medical Record Number: [id number] Patient Account Number: [id number] Date of Birth/Sex: [birth year] (52 y.o. M) Treating RN: Montey Hora Primary Care Provider: [name] Other Clinician: Referring Provider: [name] Treating Provider/Extender: Melburn Hake, HOYT Weeks in Treatment: 7 Active Problems ICD-10 Evaluated Encounter Code Description Active Date Today Diagnosis L97.522 Non-pressure chronic ulcer of other part of left foot with fat 09/05/2017 No Yes layer exposed E11.621 Type 2 diabetes mellitus with foot ulcer 09/05/2017 No Yes Inactive Problems Resolved Problems Electronic Signature(s) Signed: 10/24/2017 5:36:35 PM By: [name]  PA-C Entered By: [name] on 10/24/2017 09:08:57 Raymond [name] ([id number]) -------------------------------------------------------------------------------- Progress Note Details Patient Name: Raymond [name]. Date of Service: 10/24/2017 9:15 AM Medical Record Number: [id number] Patient Account Number: [id number] Date of Birth/Sex: 03-Apr-1965 (52 y.o. M) Treating RN: Montey Hora Primary Care Provider: [name] Other Clinician: Referring Provider: [name] Treating Provider/Extender: Melburn Hake, HOYT Weeks in Treatment: 7 Subjective Chief Complaint Information obtained from Patient left foot History of Present Illness (HPI) 09/05/17-He is seeing an initial evaluation for a left plantar foot ulcer. He has a remote history of left great  toe amputation. He states that 4-6 weeks ago he noted callus formation and ulceration. He has not seen primary care regarding this. He is not currently on antibiotic therapy. He does not routinely follow with podiatry. He states diabetic foot wear will arrive early next week. The EHR shows an A1c of 9% approximate 4 months ago but he states he had one and primary care a few weeks ago but does not know the results. He is neuropathic and does not complain of any pain, he is currently wearing crocs. 09/12/17-he is here in follow up evaluation for left plantar foot ulcer. There is improvement in both appearance and measurement. We will continue with same treatment plan and he will follow up next week. 09/19/17 on evaluation today patient actually appears to be doing excellent in regard to his ulcer on the invitation site of his left great foot plantar aspect. He's been tolerating the dressing changes without complication. In fact with the Prisma and the current measures he has been shown signs of excellent improvement week by week up to this point. We have been to breeding the wound in this seems to have been of great benefit for  him. Fortunately there is no evidence of infection. 09/26/17 on evaluation today patient appears to be doing rather well in regard to his wound. He did not know quite as much improvement this week as compared to last week. Nonetheless he still continues to show signs of improving to some degree. I do believe he may benefit from an offloading shoe. No fevers, chills, nausea, or vomiting noted at this time. 10/03/17 on evaluation today patient actually appears to be doing much better in regard to the amputation site plantar foot ulcer. Overall this appears significantly smaller even compared to previous. He's been tolerating the dressing changes without complication. He did get his diabetic shoes and I did have a look at them they appear to be fairly good. Nonetheless I do think that for the time being I would probably recommend he continue with the offloading shoe that we have been utilizing just due to the fact that with the dressing I don't know that his diabetic shoes are going to work as appropriately as far as not called an additional pressure and irritation to the area in question. He understands. 10/10/17 on evaluation today patient actually appears to be doing very well in regard to his plantar foot ulcer. He has been tolerating the dressing changes without complication. I do feel like he's making signs of good improvement and in fact of the wound bed appears to be better although it may not be significantly changed in size it appears healthier and I do believe is showing signs of improving. Nonetheless we gonna keep working towards healing as far as that is concerned. There is no evidence of infection. 10/17/17 on evaluation today patient actually appears to be doing poorly in regard to his plantar foot ulcer. Unfortunately other than just the area where the wound is itself there appears to be a blister that communicates with the wound unfortunately. This is more lateral to the wound itself and  also to the distal point of the amputation site. There does not appear to be any evidence of cellulitis spreading at the foot but I do believe some of the drainage from the site itself is. When in nature. Nonetheless this blister area I think needs to be removed in order to allow for appropriate healing of the ulcer itself I think that is gonna be  difficult for it to heal otherwise. This was discussed with the patient today. 10/24/17 on evaluation today patient actually appears to be doing better compared to last week even post debridement. He has shown signs of improvement he did test positive for Staphylococcus aureus. With that being said he notes that he's not have any discomfort and in general he does feel like things seem to been doing better. Fortunately there's no additional blistering and no evidence of remaining infection at this time. No fevers, chills, nausea, or vomiting noted at this time. He has three TYSEAN, VANDERVLIET ([id number]) days of antibiotic left. Patient History Information obtained from Patient. Family History Diabetes - Mother,Maternal Grandparents, Hypertension - Father, Stroke - Father, No family history of Cancer, Heart Disease, Hereditary Spherocytosis, Kidney Disease, Lung Disease, Seizures, Thyroid Problems, Tuberculosis. Social History Never smoker, Marital Status - Divorced, Alcohol Use - Rarely, Drug Use - No History, Caffeine Use - Never. Review of Systems (ROS) Constitutional Symptoms (General Health) Denies complaints or symptoms of Fever, Chills. Respiratory The patient has no complaints or symptoms. Cardiovascular The patient has no complaints or symptoms. Psychiatric The patient has no complaints or symptoms. Objective Constitutional Well-nourished and well-hydrated in no acute distress. Vitals Time Taken: 9:12 AM, Height: 74 in, Weight: 381 lbs, BMI: 48.9, Temperature: 98.4 F, Pulse: 70 bpm, Respiratory Rate: 18 breaths/min, Blood  Pressure: 137/56 mmHg. Respiratory normal breathing without difficulty. clear to auscultation bilaterally. Cardiovascular regular rate and rhythm with normal S1, S2. Psychiatric this patient is able to make decisions and demonstrates good insight into disease process. Alert and Oriented x 3. pleasant and cooperative. General Notes: Patient's wound bed currently did have minimal amounts of slough noted on the surface of the wound most of this was easily cleaned away just with saline and gauze. The remaining that I did have to perform sharp debridement on was minimal and patient tolerated this without complication. Integumentary (Hair, Skin) Wound #1 status is Open. Original cause of wound was Gradually Appeared. The wound is located on the Left,Distal Metatarsal head first. The wound measures 1.7cm length x 0.8cm width x 0.3cm depth; 1.068cm^2 area and 0.32cm^3 YADER, CRIGER. ([id number]) volume. There is Fat Layer (Subcutaneous Tissue) Exposed exposed. There is no tunneling or undermining noted. There is a small amount of sanguinous drainage noted. The wound margin is indistinct and nonvisible. There is medium (34-66%) pink granulation within the wound bed. There is a medium (34-66%) amount of necrotic tissue within the wound bed including Adherent Slough. The periwound skin appearance exhibited: Callus, Maceration, Hemosiderin Staining. The periwound skin appearance did not exhibit: Crepitus, Excoriation, Induration, Rash, Scarring, Dry/Scaly, Atrophie Blanche, Cyanosis, Ecchymosis, Mottled, Pallor, Rubor, Erythema. Periwound temperature was noted as No Abnormality. 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 There was a Excisional Skin/Subcutaneous Tissue Debridement with a total area of 1.36 sq cm performed by STONE III, HOYT E., PA-C. With the following instrument(s): Curette to remove Viable and Non-Viable tissue/material. Material removed includes Subcutaneous Tissue and Slough and after achieving pain control using Lidocaine 4% Topical Solution. No specimens were taken. A time out was conducted at 09:29, prior to the start of the procedure. A Minimum amount of bleeding was controlled with Pressure.  The procedure was tolerated well with a pain level of 0 throughout and a pain level of 0 following the procedure. Post Debridement Measurements: 1.7cm length x 0.8cm width x 0.4cm depth; 0.427cm^3 volume. Character of Wound/Ulcer Post Debridement is improved. Severity of Tissue Post Debridement is: Fat layer exposed. Post procedure Diagnosis Wound #1: Same as Pre-Procedure Plan Wound Cleansing: Wound #1 Left,Distal Metatarsal head first: Clean wound with Normal Saline. Cleanse wound with mild soap and water Anesthetic (add to Medication List): Wound #1 Left,Distal Metatarsal head first: Topical Lidocaine 4% cream applied to wound bed prior to debridement (In Clinic Only). Primary Wound Dressing: Wound #1 Left,Distal Metatarsal head first: Silver Alginate Secondary Dressing: Wound #1 Left,Distal Metatarsal head first: Dry Gauze Conform/Kerlix CLARANCE, BOLLARD ([id number]) Dressing Change Frequency: Wound #1 Left,Distal Metatarsal head first: Change dressing every other day. Other: - as needed Follow-up Appointments: Wound #1 Left,Distal Metatarsal head first: Return Appointment in 1 week. Edema Control: Wound #1 Left,Distal Metatarsal head first: Elevate legs to the level of the heart and pump ankles as often as possible Off-Loading: Wound #1 Left,Distal Metatarsal head first: Open toe surgical shoe with peg assist. Additional Orders  / Instructions: Wound #1 Left,Distal Metatarsal head first: Increase protein intake. At this point I'm gonna suggest that we continue with the above wound care measures for the next week. The patient is in agreement with plan. Subsequently I'll see him back for reevaluation at that time to see were things stand to make any adjustments as needed I do feel like the offloading shoe is beneficial for him at this point. Please see above for specific wound care orders. We will see patient for re-evaluation in 1 week(s) here in the clinic. If anything worsens or changes patient will contact our office for additional recommendations. Electronic Signature(s) Signed: 10/24/2017 5:36:35 PM By: [name] PA-C Entered By: [name] on 10/24/2017 09:40:02 Raymond [name] ([id number]) -------------------------------------------------------------------------------- ROS/PFSH Details Patient Name: Raymond [name]. Date of Service: 10/24/2017 9:15 AM Medical Record Number: [id number] Patient Account Number: [id number] Date of Birth/Sex: [birth year] (52 y.o. M) Treating RN: Montey Hora Primary Care Provider: [name] Other Clinician: Referring Provider: [name] Treating Provider/Extender: Melburn Hake, HOYT Weeks in Treatment: 7 Information Obtained From Patient Wound History Do you currently have one or more open woundso Yes How many open wounds do you currently haveo 1 Approximately how long have you had your woundso 3 weeks How have you been treating your wound(s) until nowo clened and bandaid Has your wound(s) ever healed and then re-openedo No Have you had any lab work done in the past montho No Have you tested positive for an antibiotic resistant organism (MRSA, VRE)o No Have you tested positive for osteomyelitis (bone infection)o No Have you had any tests for circulation on your legso No Constitutional Symptoms (General Health) Complaints and  Symptoms: Negative for: Fever; Chills Respiratory Complaints and Symptoms: No Complaints or Symptoms Cardiovascular Complaints and Symptoms: No Complaints or Symptoms Endocrine Medical History: Positive for: Type II Diabetes Negative for: Type I Diabetes Time with diabetes: 15 years Treated with: Insulin, Oral agents Blood sugar tested every day: Yes Tested : PRN Psychiatric Complaints and Symptoms: No Complaints or Symptoms Immunizations Pneumococcal Vaccine: Received Pneumococcal Vaccination: No Tetanus Vaccine: Last tetanus shot: 09/05/2016 Raymond [name] ([id number]) Implantable Devices Family and Social History Cancer: No; Diabetes: Yes - Mother,Maternal Grandparents; Heart Disease: No; Hereditary Spherocytosis: No; Hypertension: Yes - Father; Kidney Disease: No; Lung Disease: No; Seizures: No; Stroke:  Yes - Father; Thyroid Problems: No; Tuberculosis: No; Never smoker; Marital Status - Divorced; Alcohol Use: Rarely; Drug Use: No History; Caffeine Use: Never; Financial Concerns: No; Food, Clothing or Shelter Needs: No; Support System Lacking: No; Transportation Concerns: No; Advanced Directives: Yes - glenda (Not Provided); Patient does not want information on Advanced Directives; Do not resuscitate: No Physician Affirmation I have reviewed and agree with the above information. Electronic Signature(s) Signed: 10/24/2017 5:36:35 PM By: [name] PA-C Signed: 10/25/2017 5:04:30 PM By: Montey Hora Entered By: [name] on 10/24/2017 09:35:01 Raymond [name] ([id number]) -------------------------------------------------------------------------------- SuperBill Details Patient Name: Raymond [name]. Date of Service: 10/24/2017 Medical Record Number: [id number] Patient Account Number: [id number] Date of Birth/Sex: Mar 08, 1965 (52 y.o. 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

## 2017-10-26 NOTE — Progress Notes (Signed)
Raymond [name], Raymond [name] ([id number]) Visit Report for 10/24/2017 Arrival Information Details Patient Name: Raymond [name]. Date of Service: 10/24/2017 9:15 AM Medical Record Number: [id number] Patient Account Number: [id number] Date of Birth/Sex: Jun 28, 1965 (52 y.o. M) Treating RN: Montey Hora Primary Care Kirsi Hugh: [name] Other Clinician: Referring Trung Wenzl: [name] Treating Zohair Epp/Extender: Melburn Hake, HOYT Weeks in Treatment: 7 Visit Information History Since Last Visit Added or deleted any medications: No Patient Arrived: Ambulatory Any new allergies or adverse reactions: No Arrival Time: 09:11 Had a fall or experienced change in No Accompanied By: self activities of daily living that may affect Transfer Assistance: None risk of falls: Patient Identification Verified: Yes Signs or symptoms of abuse/neglect since last visito No Secondary Verification Process Completed: Yes Hospitalized since last visit: No Patient Has Alerts: Yes Implantable device outside of the clinic excluding No Patient Alerts: DMII cellular tissue based products placed in the center since last visit: Has Dressing in Place as Prescribed: Yes Pain Present Now: No Electronic Signature(s) Signed: 10/24/2017 4:26:07 PM By: Lorine Bears RCP, RRT, CHT Entered By: Lorine Bears on 10/24/2017 09:12:28 Raymond [name] ([id number]) -------------------------------------------------------------------------------- Encounter Discharge Information Details Patient Name: Raymond [name]. Date of Service: 10/24/2017 9:15 AM Medical Record Number: [id number] Patient Account Number: [id number] Date of Birth/Sex: 1965/06/08 (52 y.o. M) Treating RN: Montey Hora Primary Care Chistian Kasler: [name] Other Clinician: Referring Jaskaran Dauzat: [name] Treating Stina Gane/Extender: Melburn Hake, HOYT Weeks in Treatment: 7 Encounter Discharge Information  Items Discharge Condition: Stable Ambulatory Status: Ambulatory Discharge Destination: Home Transportation: Private Auto Accompanied By: self Schedule Follow-up Appointment: Yes Clinical Summary of Care: Post Procedure Vitals: Temperature (F): 98.4 Pulse (bpm): 70 Respiratory Rate (breaths/min): 18 Blood Pressure (mmHg): 137/56 Electronic Signature(s) Signed: 10/25/2017 5:04:30 PM By: Montey Hora Entered By: Montey Hora on 10/24/2017 09:33:45 Raymond [name] ([id number]) -------------------------------------------------------------------------------- Lower Extremity Assessment Details Patient Name: Raymond [name]. Date of Service: 10/24/2017 9:15 AM Medical Record Number: [id number] Patient Account Number: [id number] Date of Birth/Sex: [birth year] (52 y.o. M) Treating RN: Montey Hora Primary Care Jessen Siegman: [name] Other Clinician: Referring Takerra Lupinacci: [name] Treating Itzae Miralles/Extender: STONE III, HOYT Weeks in Treatment: 7 Vascular Assessment Pulses: Dorsalis Pedis Palpable: [Left:Yes] Posterior Tibial Extremity colors, hair growth, and conditions: Extremity Color: [Left:Hyperpigmented] Hair Growth on Extremity: [Left:No] Temperature of Extremity: [Left:Warm] Capillary Refill: [Left:< 3 seconds] Toe Nail Assessment Left: Right: Thick: Yes Discolored: Yes Deformed: Yes Improper Length and Hygiene: Yes Electronic Signature(s) Signed: 10/25/2017 5:04:30 PM By: Montey Hora Entered By: Montey Hora on 10/24/2017 09:21:04 Raymond [name] ([id number]) -------------------------------------------------------------------------------- Multi Wound Chart Details Patient Name: Raymond [name]. Date of Service: 10/24/2017 9:15 AM Medical Record Number: [id number] Patient Account Number: [id number] Date of Birth/Sex: [birth year] (52 y.o. M) Treating RN: Montey Hora Primary Care Keshara Kiger: [name] Other  Clinician: Referring Essense Bousquet: [name] Treating Annella Prowell/Extender: STONE III, HOYT Weeks in Treatment: 7 Vital Signs Height(in): 74 Pulse(bpm): 70 Weight(lbs): 381 Blood Pressure(mmHg): 137/56 Body Mass Index(BMI): 49 Temperature(F): 98.4 Respiratory Rate 18 (breaths/min): Photos: [1:No Photos] [N/A:N/A] Wound Location: [1:Left Metatarsal head first - Distal] [N/A:N/A] Wounding Event: [1:Gradually Appeared] [N/A:N/A] Primary Etiology: [1:Diabetic Wound/Ulcer of the Lower Extremity] [N/A:N/A] Comorbid History: [1:Type II Diabetes] [N/A:N/A] Date Acquired: [1:07/11/2017] [N/A:N/A] Weeks of Treatment: [1:7] [N/A:N/A] Wound Status: [1:Open] [N/A:N/A] Measurements L x W x D [1:1.7x0.8x0.3] [N/A:N/A] (cm) Area (cm) : [1:1.068] [N/A:N/A] Volume (cm) : [1:0.32] [N/A:N/A] % Reduction in Area: [1:-51.10%] [N/A:N/A] % Reduction in Volume: [1:-127.00%] [N/A:N/A] Classification: [1:Grade 2] [N/A:N/A] Exudate Amount: [1:Small] [N/A:N/A]  Exudate Type: [1:Sanguinous] [N/A:N/A] Exudate Color: [1:red] [N/A:N/A] Wound Margin: [1:Indistinct, nonvisible] [N/A:N/A] Granulation Amount: [1:Medium (34-66%)] [N/A:N/A] Granulation Quality: [1:Pink] [N/A:N/A] Necrotic Amount: [1:Medium (34-66%)] [N/A:N/A] Exposed Structures: [1:Fat Layer (Subcutaneous Tissue) Exposed: Yes Fascia: No Tendon: No Muscle: No Joint: No Bone: No] [N/A:N/A] Epithelialization: [1:None] [N/A:N/A] Periwound Skin Texture: [1:Callus: Yes Excoriation: No Induration: No Crepitus: No] [N/A:N/A] Rash: No Scarring: No Periwound Skin Moisture: Maceration: Yes N/A N/A Dry/Scaly: No Periwound Skin Color: Hemosiderin Staining: Yes N/A N/A Atrophie Blanche: No Cyanosis: No Ecchymosis: No Erythema: No Mottled: No Pallor: No Rubor: No Temperature: No Abnormality N/A N/A Tenderness on Palpation: No N/A N/A Wound Preparation: Ulcer Cleansing: N/A N/A Rinsed/Irrigated with Saline Topical Anesthetic Applied: Other:  lidocaine 4% Treatment Notes Electronic Signature(s) Signed: 10/25/2017 5:04:30 PM By: Montey Hora Entered By: Montey Hora on 10/24/2017 09:21:13 Raymond [name] ([id number]) -------------------------------------------------------------------------------- [place] Plan Details Patient Name: Raymond [name]. Date of Service: 10/24/2017 9:15 AM Medical Record Number: [id number] Patient Account Number: [id number] Date of Birth/Sex: [birth year] (52 y.o. M) Treating RN: Montey Hora Primary Care K[place] Sanon: [name] Other Clinician: Referring Taneia Mealor: [name] Treating Helvi Royals/Extender: Melburn Hake, HOYT Weeks in Treatment: 7 Active Inactive ` Abuse / Safety / Falls / Self Care Management Nursing Diagnoses: Potential for falls Goals: Patient will not experience any injury related to falls Date Initiated: 09/05/2017 Target Resolution Date: 12/09/2017 Goal Status: Active Interventions: Assess fall risk on admission and as needed Assess: immobility, friction, shearing, incontinence upon admission and as needed Assess impairment of mobility on admission and as needed per policy Assess personal safety and home safety (as indicated) on admission and as needed Assess self care needs on admission and as needed Notes: ` Nutrition Nursing Diagnoses: Imbalanced nutrition Impaired glucose control: actual or potential Potential for alteratiion in Nutrition/Potential for imbalanced nutrition Goals: Patient/caregiver agrees to and verbalizes understanding of need to use nutritional supplements and/or vitamins as prescribed Date Initiated: 09/05/2017 Target Resolution Date: 01/06/2018 Goal Status: Active Patient/caregiver will maintain therapeutic glucose control Date Initiated: 09/05/2017 Target Resolution Date: 01/06/2018 Goal Status: Active Interventions: Assess patient nutrition upon admission and as needed per policy Provide education on elevated  blood sugars and impact on wound healing Provide education on nutrition Notes: Raymond [name], Raymond [name] ([id number]) Orientation to the Wound Care Program Nursing Diagnoses: Knowledge deficit related to the wound healing center program Goals: Patient/caregiver will verbalize understanding of the [place] Date Initiated: 09/05/2017 Target Resolution Date: 10/07/2017 Goal Status: Active Interventions: Provide education on orientation to the wound center Notes: ` Wound/Skin Impairment Nursing Diagnoses: Impaired tissue integrity Knowledge deficit related to ulceration/compromised skin integrity Goals: Ulcer/skin breakdown will have a volume reduction of 80% by week 12 Date Initiated: 09/05/2017 Target Resolution Date: 01/06/2018 Goal Status: Active Interventions: Assess patient/caregiver ability to perform ulcer/skin care regimen upon admission and as needed Assess ulceration(s) every visit Notes: Electronic Signature(s) Signed: 10/25/2017 5:04:30 PM By: Montey Hora Entered By: Montey Hora on 10/24/2017 09:21:07 Raymond [name] ([id number]) -------------------------------------------------------------------------------- Pain Assessment Details Patient Name: Raymond [name]. Date of Service: 10/24/2017 9:15 AM Medical Record Number: [id number] Patient Account Number: [id number] Date of Birth/Sex: December 14, 1965 (52 y.o. M) Treating RN: Montey Hora Primary Care Elvyn Krohn: [name] Other Clinician: Referring Mckenleigh Tarlton: [name] Treating Javione Gunawan/Extender: Melburn Hake, HOYT Weeks in Treatment: 7 Active Problems Location of Pain Severity and Description of Pain Patient Has Paino No Site Locations Pain Management and Medication Current Pain Management: Electronic Signature(s) Signed: 10/24/2017 4:26:07 PM By: Lorine Bears RCP,  RRT, CHT Signed: 10/25/2017 5:04:30 PM By: Montey Hora Entered By: Lorine Bears on 10/24/2017 09:12:35 Raymond [name] ([id number]) -------------------------------------------------------------------------------- Patient/Caregiver Education Details Patient Name: Raymond [name]. Date of Service: 10/24/2017 9:15 AM Medical Record Number: [id number] Patient Account Number: [id number] Date of Birth/Gender: Aug 03, 1965 (52 y.o. M) Treating RN: Montey Hora Primary Care Physician: [name] Other Clinician: Referring Physician: [name] Treating Physician/Extender: Sharalyn Ink in Treatment: 7 Education Assessment Education Provided To: Patient Education Topics Provided Wound/Skin Impairment: Handouts: Other: wound care as ordered Methods: Demonstration, Explain/Verbal Responses: State content correctly Electronic Signature(s) Signed: 10/25/2017 5:04:30 PM By: Montey Hora Entered By: Montey Hora on 10/24/2017 09:34:02 Raymond [name] ([id number]) -------------------------------------------------------------------------------- Wound Assessment Details Patient Name: Raymond [name]. Date of Service: 10/24/2017 9:15 AM Medical Record Number: [id number] Patient Account Number: [id number] Date of Birth/Sex: [birth year] (52 y.o. M) Treating RN: Montey Hora Primary Care Tyrick Dunagan: [name] Other Clinician: Referring Ellicia Alix: [name] Treating Lasundra Hascall/Extender: STONE III, HOYT Weeks in Treatment: 7 Wound Status Wound Number: 1 Primary Etiology: Diabetic Wound/Ulcer of the Lower Extremity Wound Location: Left Metatarsal head first - Distal Wound Status: Open Wounding Event: Gradually Appeared Comorbid Type II Diabetes Date Acquired: 07/11/2017 History: Weeks Of Treatment: 7 Clustered Wound: No Photos Photo Uploaded By: Montey Hora on 10/24/2017 15:54:01 Wound Measurements Length: (cm) 1.7 Width: (cm) 0.8 Depth: (cm) 0.3 Area: (cm) 1.068 Volume: (cm) 0.32 % Reduction in  Area: -51.1% % Reduction in Volume: -127% Epithelialization: None Tunneling: No Undermining: No Wound Description Classification: Grade 2 Wound Margin: Indistinct, nonvisible Exudate Amount: Small Exudate Type: Sanguinous Exudate Color: red Foul Odor After Cleansing: No Slough/Fibrino No Wound Bed Granulation Amount: Medium (34-66%) Exposed Structure Granulation Quality: Pink Fascia Exposed: No Necrotic Amount: Medium (34-66%) Fat Layer (Subcutaneous Tissue) Exposed: Yes Necrotic Quality: Adherent Slough Tendon Exposed: No Muscle Exposed: No Joint Exposed: No Bone Exposed: No Periwound Skin Texture Raymond [name], Raymond [name] ([id number]) Texture Color No Abnormalities Noted: No No Abnormalities Noted: No Callus: Yes Atrophie Blanche: No Crepitus: No Cyanosis: No Excoriation: No Ecchymosis: No Induration: No Erythema: No Rash: No Hemosiderin Staining: Yes Scarring: No Mottled: No Pallor: No Moisture Rubor: No No Abnormalities Noted: No Dry / Scaly: No Temperature / Pain Maceration: Yes Temperature: No Abnormality Wound Preparation Ulcer Cleansing: Rinsed/Irrigated with Saline Topical Anesthetic Applied: Other: lidocaine 4%, Treatment Notes Wound #1 (Left, Distal Metatarsal head first) 1. Cleansed with: Clean wound with Normal Saline 2. Anesthetic Topical Lidocaine 4% cream to wound bed prior to debridement 4. Dressing Applied: Calcium Alginate with Silver 5. Secondary Dressing Applied Dry Gauze Kerlix/Conform 7. Secured with Tape Notes compression sock with offloading shoe peg assist Electronic Signature(s) Signed: 10/25/2017 5:04:30 PM By: Montey Hora Entered By: Montey Hora on 10/24/2017 09:20:37 Raymond [name] ([id number]) -------------------------------------------------------------------------------- [place] Details Patient Name: Raymond [name]. Date of Service: 10/24/2017 9:15 AM Medical Record Number: [id number] Patient  Account Number: [id number] Date of Birth/Sex: 28-Mar-1965 (52 y.o. M) Treating RN: Montey Hora Primary Care Emmali Karow: [name] Other Clinician: Referring Jamillia Closson: [name] Treating Zekiel Torian/Extender: STONE III, HOYT Weeks in Treatment: 7 Vital Signs Time Taken: 09:12 Temperature (F): 98.4 Height (in): 74 Pulse (bpm): 70 Weight (lbs): 381 Respiratory Rate (breaths/min): 18 Body Mass Index (BMI): 48.9 Blood Pressure (mmHg): 137/56 Reference Range: 80 - 120 mg / dl Electronic Signature(s) Signed: 10/24/2017 4:26:07 PM By: Lorine Bears RCP, RRT, CHT Entered By: Lorine Bears on 10/24/2017 09:14:35

## 2017-10-31 ENCOUNTER — Encounter: Payer: Medicare PPO | Attending: Physician Assistant | Admitting: Physician Assistant

## 2017-10-31 DIAGNOSIS — E11621 Type 2 diabetes mellitus with foot ulcer: Secondary | ICD-10-CM | POA: Insufficient documentation

## 2017-10-31 DIAGNOSIS — L97522 Non-pressure chronic ulcer of other part of left foot with fat layer exposed: Secondary | ICD-10-CM | POA: Diagnosis not present

## 2017-11-01 NOTE — Progress Notes (Signed)
PAU, BANH ([id number]) Visit Report for 10/31/2017 Chief Complaint Document Details Patient Name: Raymond [name], Raymond [name]. Date of Service: 10/31/2017 8:30 AM Medical Record Number: [id number] Patient Account Number: [id number] Date of Birth/Sex: 1965-03-28 (52 y.o. M) Treating RN: Montey Hora Primary Care Provider: [name] Other Clinician: Referring Provider: [name] Treating Provider/Extender: Melburn Hake, Rio Taber Weeks in Treatment: 8 Information Obtained from: Patient Chief Complaint left foot Electronic Signature(s) Signed: 10/31/2017 9:56:08 AM By: [name] PA-C Entered By: [name] on 10/31/2017 08:37:08 Raymond [name] ([id number]) -------------------------------------------------------------------------------- Debridement Details Patient Name: Raymond [name]. Date of Service: 10/31/2017 8:30 AM Medical Record Number: [id number] Patient Account Number: [id number] Date of Birth/Sex: 01-Apr-1965 (52 y.o. M) Treating RN: Montey Hora Primary Care Provider: [name] Other Clinician: Referring Provider: [name] Treating Provider/Extender: Melburn Hake, Vashaun Osmon Weeks in Treatment: 8 Debridement Performed for Wound #1 Left,Distal Metatarsal head first Assessment: Performed By: Physician STONE III, Anona Giovannini E., PA-C Debridement Type: Debridement Severity of Tissue Pre Fat layer exposed Debridement: Level of Consciousness (Pre- Awake and Alert procedure): Pre-procedure Verification/Time Yes - 09:00 Out Taken: Start Time: 09:00 Pain Control: Lidocaine 4% Topical Solution Total Area Debrided (L x W): 1.4 (cm) x 0.5 (cm) = 0.7 (cm) Tissue and other material Callus, Slough, Subcutaneous, Slough debrided: Level: Skin/Subcutaneous Tissue Debridement Description: Excisional Instrument: Curette Bleeding: Minimum Hemostasis Achieved: Pressure End Time: 09:06 Procedural Pain: 0 Post Procedural Pain: 0 Response to  Treatment: Procedure was tolerated well Level of Consciousness Awake and Alert (Post-procedure): Post Debridement Measurements of Total Wound Length: (cm) 1.4 Width: (cm) 0.5 Depth: (cm) 0.4 Volume: (cm) 0.22 Character of Wound/Ulcer Post Debridement: Improved Severity of Tissue Post Debridement: Fat layer exposed Post Procedure Diagnosis Same as Pre-procedure Electronic Signature(s) Signed: 10/31/2017 9:56:08 AM By: [name] PA-C Signed: 10/31/2017 5:00:45 PM By: Montey Hora Entered By: Montey Hora on 10/31/2017 09:06:10 Raymond [name] ([id number]) -------------------------------------------------------------------------------- HPI Details Patient Name: Raymond [name]. Date of Service: 10/31/2017 8:30 AM Medical Record Number: [id number] Patient Account Number: [id number] Date of Birth/Sex: Mar 23, 1965 (52 y.o. M) Treating RN: Montey Hora Primary Care Provider: [name] Other Clinician: Referring Provider: [name] Treating Provider/Extender: Melburn Hake, Arelene Moroni Weeks in Treatment: 8 History of Present Illness HPI Description: 09/05/17-He is seeing an initial evaluation for a left plantar foot ulcer. He has a remote history of left great toe amputation. He states that 4-6 weeks ago he noted callus formation and ulceration. He has not seen primary care regarding this. He is not currently on antibiotic therapy. He does not routinely follow with podiatry. He states diabetic foot wear will arrive early next week. The EHR shows an A1c of 9% approximate 4 months ago but he states he had one and primary care a few weeks ago but does not know the results. He is neuropathic and does not complain of any pain, he is currently wearing crocs. 09/12/17-he is here in follow up evaluation for left plantar foot ulcer. There is improvement in both appearance and measurement. We will continue with same treatment plan and he will follow up next week. 09/19/17 on  evaluation today patient actually appears to be doing excellent in regard to his ulcer on the invitation site of his left great foot plantar aspect. He's been tolerating the dressing changes without complication. In fact with the Prisma and the current measures he has been shown signs of excellent improvement week by week up to this point. We have been to breeding the wound in this  seems to have been of great benefit for him. Fortunately there is no evidence of infection. 09/26/17 on evaluation today patient appears to be doing rather well in regard to his wound. He did not know quite as much improvement this week as compared to last week. Nonetheless he still continues to show signs of improving to some degree. I do believe he may benefit from an offloading shoe. No fevers, chills, nausea, or vomiting noted at this time. 10/03/17 on evaluation today patient actually appears to be doing much better in regard to the amputation site plantar foot ulcer. Overall this appears significantly smaller even compared to previous. He's been tolerating the dressing changes without complication. He did get his diabetic shoes and I did have a look at them they appear to be fairly good. Nonetheless I do think that for the time being I would probably recommend he continue with the offloading shoe that we have been utilizing just due to the fact that with the dressing I don't know that his diabetic shoes are going to work as appropriately as far as not called an additional pressure and irritation to the area in question. He understands. 10/10/17 on evaluation today patient actually appears to be doing very well in regard to his plantar foot ulcer. He has been tolerating the dressing changes without complication. I do feel like he's making signs of good improvement and in fact of the wound bed appears to be better although it may not be significantly changed in size it appears healthier and I do believe is showing signs of  improving. Nonetheless we gonna keep working towards healing as far as that is concerned. There is no evidence of infection. 10/17/17 on evaluation today patient actually appears to be doing poorly in regard to his plantar foot ulcer. Unfortunately other than just the area where the wound is itself there appears to be a blister that communicates with the wound unfortunately. This is more lateral to the wound itself and also to the distal point of the amputation site. There does not appear to be any evidence of cellulitis spreading at the foot but I do believe some of the drainage from the site itself is. When in nature. Nonetheless this blister area I think needs to be removed in order to allow for appropriate healing of the ulcer itself I think that is gonna be difficult for it to heal otherwise. This was discussed with the patient today. 10/24/17 on evaluation today patient actually appears to be doing better compared to last week even post debridement. He has shown signs of improvement he did test positive for Staphylococcus aureus. With that being said he notes that he's not have any discomfort and in general he does feel like things seem to been doing better. Fortunately there's no additional blistering and no evidence of remaining infection at this time. No fevers, chills, nausea, or vomiting noted at this time. He has three days of antibiotic left. 10/31/17 on evaluation today patient actually appears to be doing very well in regard to his ulcer on the left foot. He has been tolerating the dressing changes without complication. With that being said fortunately there appears to be no evidence of infection I do think he's made progress compared to previous. No fevers, chills, nausea, or vomiting noted at this time. Raymond [name], Raymond [name] ([id number]) Electronic Signature(s) Signed: 10/31/2017 9:56:08 AM By: [name] PA-C Entered By: [name] on 10/31/2017 09:08:14 Raymond [name] ([id number]) -------------------------------------------------------------------------------- Physical Exam Details  Patient Name: Raymond [name], Raymond [name]. Date of Service: 10/31/2017 8:30 AM Medical Record Number: [id number] Patient Account Number: [id number] Date of Birth/Sex: 21-Aug-1965 (52 y.o. M) Treating RN: Montey Hora Primary Care Provider: [name] Other Clinician: Referring Provider: [name] Treating Provider/Extender: STONE III, Geralyn Figiel Weeks in Treatment: 8 Constitutional Well-nourished and well-hydrated in no acute distress. Respiratory normal breathing without difficulty. Psychiatric this patient is able to make decisions and demonstrates good insight into disease process. Alert and Oriented x 3. pleasant and cooperative. Notes Patient's wound bed currently did have some Slough noted on the surface the wound along with callous buildup around the printer of the wound. This was sharply debrided away to clean the wound very well today without complication post debridement the wound bed appears to be doing significantly better. Electronic Signature(s) Signed: 10/31/2017 9:56:08 AM By: [name] PA-C Entered By: [name] on 10/31/2017 09:08:52 Raymond [name] ([id number]) -------------------------------------------------------------------------------- Physician Orders Details Patient Name: Raymond [name]. Date of Service: 10/31/2017 8:30 AM Medical Record Number: [id number] Patient Account Number: [id number] Date of Birth/Sex: [birth year] (52 y.o. 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 o Cleanse wound with mild soap and water Anesthetic (add to Medication List) Wound #1 Left,Distal Metatarsal head first o Topical Lidocaine 4% cream applied to wound bed prior to debridement (In Clinic Only). Primary Wound Dressing Wound #1 Left,Distal Metatarsal head first o Silver Alginate Secondary Dressing Wound #1 Left,Distal Metatarsal head first o Dry Gauze o Conform/Kerlix Dressing Change Frequency Wound #1 Left,Distal Metatarsal head first o Change dressing every other day. o Other: - as needed Follow-up Appointments Wound #1 Left,Distal Metatarsal head first o Return Appointment in 2 weeks. Edema Control Wound #1 Left,Distal Metatarsal head first o Patient to wear own compression stockings o Elevate legs to the level of the heart and pump ankles as often as possible Off-Loading Wound #1 Left,Distal Metatarsal head first o Open toe surgical shoe with peg assist. Raymond [name] ([id number]) Additional Orders / Instructions Wound #1 Left,Distal Metatarsal head first o Increase protein intake. Electronic Signature(s) Signed: 10/31/2017 9:56:08 AM By: [name] PA-C Signed: 10/31/2017 5:00:45 PM By: Montey Hora Entered By: Montey Hora on 10/31/2017 09:05:42 Raymond [name] ([id number]) -------------------------------------------------------------------------------- Problem List Details Patient Name: Raymond [name]. Date of Service: 10/31/2017 8:30 AM Medical Record Number: [id number] Patient Account Number: [id number] Date of Birth/Sex: 1965-11-10 (52 y.o. M) Treating RN: Montey Hora Primary Care Provider: [name] Other Clinician: Referring Provider: [name] Treating Provider/Extender: Melburn Hake, Tayleigh Wetherell Weeks in Treatment: 8 Active Problems ICD-10 Evaluated Encounter Code  Description Active Date Today Diagnosis L97.522 Non-pressure chronic ulcer of other part of left foot with fat 09/05/2017 No Yes layer exposed E11.621 Type 2 diabetes mellitus with foot ulcer 09/05/2017 No Yes Inactive Problems Resolved Problems Electronic Signature(s) Signed: 10/31/2017 9:56:08 AM By: [name] PA-C Entered By: [name] on 10/31/2017 08:37:02 Raymond [name] ([id number]) -------------------------------------------------------------------------------- Progress Note Details Patient Name: Raymond [name]. Date of Service: 10/31/2017 8:30 AM Medical Record Number: [id number] Patient Account Number: [id number] Date of Birth/Sex: 1965/04/17 (52 y.o. M) Treating RN:  Montey Hora Primary Care Provider: [name] Other Clinician: Referring Provider: [name] Treating Provider/Extender: Melburn Hake, Payeton Germani Weeks in Treatment: 8 Subjective Chief Complaint Information obtained from Patient left foot History of Present Illness (HPI) 09/05/17-He is seeing an initial evaluation for a left plantar foot ulcer. He has a remote history of left great toe amputation. He states that 4-6 weeks ago he noted callus formation and ulceration. He has not seen primary care regarding this. He is not currently on antibiotic therapy. He does not routinely follow with podiatry. He states diabetic foot wear will arrive early next week. The EHR shows an A1c of 9% approximate 4 months ago but he states he had one and primary care a few weeks ago but does not know the results. He is neuropathic and does not complain of any pain, he is currently wearing crocs. 09/12/17-he is here in follow up evaluation for left plantar foot ulcer. There is improvement in both appearance and measurement. We will continue with same treatment plan and he will follow up next week. 09/19/17 on evaluation today patient actually appears to be doing excellent in regard to his ulcer on the invitation  site of his left great foot plantar aspect. He's been tolerating the dressing changes without complication. In fact with the Prisma and the current measures he has been shown signs of excellent improvement week by week up to this point. We have been to breeding the wound in this seems to have been of great benefit for him. Fortunately there is no evidence of infection. 09/26/17 on evaluation today patient appears to be doing rather well in regard to his wound. He did not know quite as much improvement this week as compared to last week. Nonetheless he still continues to show signs of improving to some degree. I do believe he may benefit from an offloading shoe. No fevers, chills, nausea, or vomiting noted at this time. 10/03/17 on evaluation today patient actually appears to be doing much better in regard to the amputation site plantar foot ulcer. Overall this appears significantly smaller even compared to previous. He's been tolerating the dressing changes without complication. He did get his diabetic shoes and I did have a look at them they appear to be fairly good. Nonetheless I do think that for the time being I would probably recommend he continue with the offloading shoe that we have been utilizing just due to the fact that with the dressing I don't know that his diabetic shoes are going to work as appropriately as far as not called an additional pressure and irritation to the area in question. He understands. 10/10/17 on evaluation today patient actually appears to be doing very well in regard to his plantar foot ulcer. He has been tolerating the dressing changes without complication. I do feel like he's making signs of good improvement and in fact of the wound bed appears to be better although it may not be significantly changed in size it appears healthier and I do believe is showing signs of improving. Nonetheless we gonna keep working towards healing as far as that is concerned. There is  no evidence of infection. 10/17/17 on evaluation today patient actually appears to be doing poorly in regard to his plantar foot ulcer. Unfortunately other than just the area where the wound is itself there appears to be a blister that communicates with the wound unfortunately. This is more lateral to the wound itself and also to the distal point of the amputation site. There  does not appear to be any evidence of cellulitis spreading at the foot but I do believe some of the drainage from the site itself is. When in nature. Nonetheless this blister area I think needs to be removed in order to allow for appropriate healing of the ulcer itself I think that is gonna be difficult for it to heal otherwise. This was discussed with the patient today. 10/24/17 on evaluation today patient actually appears to be doing better compared to last week even post debridement. He has shown signs of improvement he did test positive for Staphylococcus aureus. With that being said he notes that he's not have any discomfort and in general he does feel like things seem to been doing better. Fortunately there's no additional blistering and no evidence of remaining infection at this time. No fevers, chills, nausea, or vomiting noted at this time. He has three SAGAR, TENGAN ([id number]) days of antibiotic left. 10/31/17 on evaluation today patient actually appears to be doing very well in regard to his ulcer on the left foot. He has been tolerating the dressing changes without complication. With that being said fortunately there appears to be no evidence of infection I do think he's made progress compared to previous. No fevers, chills, nausea, or vomiting noted at this time. Patient History Information obtained from Patient. Family History Diabetes - Mother,Maternal Grandparents, Hypertension - Father, Stroke - Father, No family history of Cancer, Heart Disease, Hereditary Spherocytosis, Kidney Disease, Lung  Disease, Seizures, Thyroid Problems, Tuberculosis. Social History Never smoker, Marital Status - Divorced, Alcohol Use - Rarely, Drug Use - No History, Caffeine Use - Never. Review of Systems (ROS) Constitutional Symptoms (General Health) Denies complaints or symptoms of Fever, Chills. Respiratory The patient has no complaints or symptoms. Cardiovascular The patient has no complaints or symptoms. Psychiatric The patient has no complaints or symptoms. Objective Constitutional Well-nourished and well-hydrated in no acute distress. Vitals Time Taken: 8:26 AM, Height: 74 in, Weight: 381 lbs, BMI: 48.9, Temperature: 98.4 F, Pulse: 79 bpm, Respiratory Rate: 18 breaths/min, Blood Pressure: 143/71 mmHg. Respiratory normal breathing without difficulty. Psychiatric this patient is able to make decisions and demonstrates good insight into disease process. Alert and Oriented x 3. pleasant and cooperative. General Notes: Patient's wound bed currently did have some Slough noted on the surface the wound along with callous buildup around the printer of the wound. This was sharply debrided away to clean the wound very well today without complication post debridement the wound bed appears to be doing significantly better. Integumentary (Hair, Skin) Wound #1 status is Open. Original cause of wound was Gradually Appeared. The wound is located on the Left,Distal Raymond [name], Raymond [name]. ([id number]) Metatarsal head first. The wound measures 1.4cm length x 0.5cm width x 0.3cm depth; 0.55cm^2 area and 0.165cm^3 volume. There is Fat Layer (Subcutaneous Tissue) Exposed exposed. There is no tunneling or undermining noted. There is a small amount of sanguinous drainage noted. The wound margin is indistinct and nonvisible. There is medium (34-66%) pink, pale granulation within the wound bed. There is a medium (34-66%) amount of necrotic tissue within the wound bed including Adherent Slough. The periwound skin  appearance exhibited: Callus, Maceration, Hemosiderin Staining. The periwound skin appearance did not exhibit: Crepitus, Excoriation, Induration, Rash, Scarring, Dry/Scaly, Atrophie Blanche, Cyanosis, Ecchymosis, Mottled, Pallor, Rubor, Erythema. Periwound temperature was noted as No Abnormality. 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 There was a Excisional Skin/Subcutaneous Tissue Debridement with a total area of 0.7 sq cm performed by STONE III, Savannah Morford E., PA-C. With the following instrument(s): Curette Material removed includes Callus, Subcutaneous Tissue, and Slough after achieving pain control using Lidocaine 4% Topical Solution. No specimens were taken. A time out was conducted at 09:00, prior to the start of the procedure. A Minimum amount of bleeding was controlled with Pressure. The procedure was tolerated well with a pain level of 0 throughout and a pain level of 0 following the procedure. Post Debridement Measurements: 1.4cm length x 0.5cm width x 0.4cm depth; 0.22cm^3 volume. Character of Wound/Ulcer Post Debridement is improved. Severity of Tissue Post Debridement is: Fat layer exposed. Post procedure Diagnosis Wound #1: Same as Pre-Procedure Plan Wound Cleansing: Wound #1 Left,Distal Metatarsal head first: Clean wound with Normal Saline. Cleanse wound with mild soap and water Anesthetic (add to Medication List): Wound #1 Left,Distal Metatarsal head first: Topical Lidocaine 4% cream applied to wound bed prior to debridement (In Clinic Only). Primary Wound Dressing: Wound #1 Left,Distal Metatarsal head first: Silver Alginate Secondary Dressing: Wound #1  Left,Distal Metatarsal head first: Dry Gauze Raymond [name], Raymond [name] ([id number]) Conform/Kerlix Dressing Change Frequency: Wound #1 Left,Distal Metatarsal head first: Change dressing every other day. Other: - as needed Follow-up Appointments: Wound #1 Left,Distal Metatarsal head first: Return Appointment in 2 weeks. Edema Control: Wound #1 Left,Distal Metatarsal head first: Patient to wear own compression stockings Elevate legs to the level of the heart and pump ankles as often as possible Off-Loading: Wound #1 Left,Distal Metatarsal head first: Open toe surgical shoe with peg assist. Additional Orders / Instructions: Wound #1 Left,Distal Metatarsal head first: Increase protein intake. I'm gonna suggest that we continue with the above wound care measures for the next week. In fact I'll be see him back for a two week follow-up at this point since I will be out of town next week during his normal appointment time. The patient is in agreement that plan. If anything changes or worsens in the meantime he'll let me know otherwise will see were things stand. Please see above for specific wound care orders. We will see patient for re-evaluation in 2 week(s) here in the clinic. If anything worsens or changes patient will contact our office for additional recommendations. Electronic Signature(s) Signed: 10/31/2017 9:56:08 AM By: [name] PA-C Entered By: [name] on 10/31/2017 09:09:11 Raymond [name] ([id number]) -------------------------------------------------------------------------------- ROS/PFSH Details Patient Name: Raymond [name]. Date of Service: 10/31/2017 8:30 AM Medical Record Number: [id number] Patient Account Number: [id number] Date of Birth/Sex: 08-Dec-1965 (52 y.o. M) Treating RN: Montey Hora Primary Care Provider: [name] Other Clinician: Referring Provider: [name] Treating Provider/Extender: Melburn Hake, Alyra Patty Weeks in  Treatment: 8 Information Obtained From Patient Wound History Do you currently have one or more open woundso Yes How many open wounds do you currently haveo 1 Approximately how long have you had your woundso 3 weeks How have you been treating your wound(s) until nowo clened and bandaid Has your wound(s) ever healed and then re-openedo No Have you had any lab work done in the past montho No Have you tested positive for an antibiotic resistant organism (MRSA, VRE)o No Have you tested positive for osteomyelitis (bone infection)o No Have you had any tests for circulation on your legso No  Constitutional Symptoms (General Health) Complaints and Symptoms: Negative for: Fever; Chills Respiratory Complaints and Symptoms: No Complaints or Symptoms Cardiovascular Complaints and Symptoms: No Complaints or Symptoms Endocrine Medical History: Positive for: Type II Diabetes Negative for: Type I Diabetes Time with diabetes: 15 years Treated with: Insulin, Oral agents Blood sugar tested every day: Yes Tested : PRN Psychiatric Complaints and Symptoms: No Complaints or Symptoms Immunizations Pneumococcal Vaccine: Received Pneumococcal Vaccination: No Tetanus Vaccine: Last tetanus shot: 09/05/2016 Raymond [name] ([id number]) Implantable Devices Family and Social History Cancer: No; Diabetes: Yes - Mother,Maternal Grandparents; Heart Disease: No; Hereditary Spherocytosis: No; Hypertension: Yes - Father; Kidney Disease: No; Lung Disease: No; Seizures: No; Stroke: Yes - Father; Thyroid Problems: No; Tuberculosis: No; Never smoker; Marital Status - Divorced; Alcohol Use: Rarely; Drug Use: No History; Caffeine Use: Never; Financial Concerns: No; Food, Clothing or Shelter Needs: No; Support System Lacking: No; Transportation Concerns: No; Advanced Directives: Yes - glenda (Not Provided); Patient does not want information on Advanced Directives; Do not resuscitate: No Physician  Affirmation I have reviewed and agree with the above information. Electronic Signature(s) Signed: 10/31/2017 9:56:08 AM By: [name] PA-C Signed: 10/31/2017 5:00:45 PM By: Montey Hora Entered By: [name] on 10/31/2017 09:08:31 Raymond [name] ([id number]) -------------------------------------------------------------------------------- [place] Details Patient Name: Raymond [name]. Date of Service: 10/31/2017 Medical Record Number: [id number] Patient Account Number: [id number] Date of Birth/Sex: [birth year] (52 y.o. 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

## 2017-11-01 NOTE — Progress Notes (Signed)
YATES, WEISGERBER ([id number]) Visit Report for 10/31/2017 Arrival Information Details Patient Name: Raymond [name], Raymond [name]. Date of Service: 10/31/2017 8:30 AM Medical Record Number: [id number] Patient Account Number: [id number] Date of Birth/Sex: 07/02/1965 (52 y.o. M) Treating RN: Montey Hora Primary Care Darcey Cardy: [name] Other Clinician: Referring Maleaha Hughett: [name] Treating Kaliegh Willadsen/Extender: Melburn Hake, HOYT Weeks in Treatment: 8 Visit Information History Since Last Visit Added or deleted any medications: No Patient Arrived: Ambulatory Any new allergies or adverse reactions: No Arrival Time: 08:26 Had a fall or experienced change in No Accompanied By: self activities of daily living that may affect Transfer Assistance: None risk of falls: Patient Identification Verified: Yes Signs or symptoms of abuse/neglect since last visito No Secondary Verification Process Completed: Yes Hospitalized since last visit: No Patient Has Alerts: Yes Implantable device outside of the clinic excluding No Patient Alerts: DMII cellular tissue based products placed in the center since last visit: Has Dressing in Place as Prescribed: Yes Pain Present Now: No Electronic Signature(s) Signed: 10/31/2017 8:47:37 AM By: Lorine Bears RCP, RRT, CHT Entered By: Lorine Bears on 10/31/2017 08:26:38 Raymond [name] ([id number]) -------------------------------------------------------------------------------- Encounter Discharge Information Details Patient Name: Raymond [name]. Date of Service: 10/31/2017 8:30 AM Medical Record Number: [id number] Patient Account Number: [id number] Date of Birth/Sex: 07/23/1965 (52 y.o. M) Treating RN: Cornell Barman Primary Care Rodrigues Urbanek: [name] Other Clinician: Referring Agnes Brightbill: [name] Treating Deivi Huckins/Extender: Melburn Hake, HOYT Weeks in Treatment: 8 Encounter Discharge Information  Items Discharge Condition: Stable Ambulatory Status: Ambulatory Discharge Destination: Home Transportation: Private Auto Accompanied By: self Schedule Follow-up Appointment: Yes Clinical Summary of Care: Post Procedure Vitals: Temperature (F): 98.4 Pulse (bpm): 79 Respiratory Rate (breaths/min): 18 Blood Pressure (mmHg): 143/71 Electronic Signature(s) Signed: 10/31/2017 4:33:57 PM By: Gretta Cool, BSN, RN, CWS, Kim RN, BSN Entered By: Gretta Cool, BSN, RN, CWS, Kim on 10/31/2017 09:17:12 Raymond [name] ([id number]) -------------------------------------------------------------------------------- Lower Extremity Assessment Details Patient Name: Raymond [name]. Date of Service: 10/31/2017 8:30 AM Medical Record Number: [id number] Patient Account Number: [id number] Date of Birth/Sex: 06-Jun-1965 (52 y.o. M) Treating RN: Cornell Barman Primary Care Ples Trudel: [name] Other Clinician: Referring Menna Abeln: [name] Treating Deontra Pereyra/Extender: Melburn Hake, HOYT Weeks in Treatment: 8 Vascular Assessment Pulses: Dorsalis Pedis Palpable: [Left:Yes] Posterior Tibial Extremity colors, hair growth, and conditions: Extremity Color: [Left:Normal] Hair Growth on Extremity: [Left:Yes] Temperature of Extremity: [Left:Warm] Capillary Refill: [Left:< 3 seconds] Toe Nail Assessment Left: Right: Thick: Yes Discolored: Yes Deformed: No Improper Length and Hygiene: No Electronic Signature(s) Signed: 10/31/2017 4:33:57 PM By: Gretta Cool, BSN, RN, CWS, Kim RN, BSN Entered By: Gretta Cool, BSN, RN, CWS, Kim on 10/31/2017 08:38:52 Raymond [name] ([id number]) -------------------------------------------------------------------------------- Multi Wound Chart Details Patient Name: Raymond [name]. Date of Service: 10/31/2017 8:30 AM Medical Record Number: [id number] Patient Account Number: [id number] Date of Birth/Sex: 1965/06/27 (52 y.o. M) Treating RN: Montey Hora Primary Care  Genieve Ramaswamy: [name] Other Clinician: Referring Coriann Brouhard: [name] Treating Elky Funches/Extender: STONE III, HOYT Weeks in Treatment: 8 Vital Signs Height(in): 74 Pulse(bpm): 79 Weight(lbs): 381 Blood Pressure(mmHg): 143/71 Body Mass Index(BMI): 49 Temperature(F): 98.4 Respiratory Rate 18 (breaths/min): Photos: [1:No Photos] [N/A:N/A] Wound Location: [1:Left Metatarsal head first - Distal] [N/A:N/A] Wounding Event: [1:Gradually Appeared] [N/A:N/A] Primary Etiology: [1:Diabetic Wound/Ulcer of the Lower Extremity] [N/A:N/A] Comorbid History: [1:Type II Diabetes] [N/A:N/A] Date Acquired: [1:07/11/2017] [N/A:N/A] Weeks of Treatment: [1:8] [N/A:N/A] Wound Status: [1:Open] [N/A:N/A] Measurements L x W x D [1:1.4x0.5x0.3] [N/A:N/A] (cm) Area (cm) : [1:0.55] [N/A:N/A] Volume (cm) : [1:0.165] [N/A:N/A] % Reduction in Area: [  1:22.20%] [N/A:N/A] % Reduction in Volume: [1:-17.00%] [N/A:N/A] Classification: [1:Grade 2] [N/A:N/A] Exudate Amount: [1:Small] [N/A:N/A] Exudate Type: [1:Sanguinous] [N/A:N/A] Exudate Color: [1:red] [N/A:N/A] Wound Margin: [1:Indistinct, nonvisible] [N/A:N/A] Granulation Amount: [1:Medium (34-66%)] [N/A:N/A] Granulation Quality: [1:Pink, Pale] [N/A:N/A] Necrotic Amount: [1:Medium (34-66%)] [N/A:N/A] Exposed Structures: [1:Fat Layer (Subcutaneous Tissue) Exposed: Yes Fascia: No Tendon: No Muscle: No Joint: No Bone: No] [N/A:N/A] Epithelialization: [1:None] [N/A:N/A] Periwound Skin Texture: [1:Callus: Yes Excoriation: No Induration: No Crepitus: No] [N/A:N/A] Rash: No Scarring: No Periwound Skin Moisture: Maceration: Yes N/A N/A Dry/Scaly: No Periwound Skin Color: Hemosiderin Staining: Yes N/A N/A Atrophie Blanche: No Cyanosis: No Ecchymosis: No Erythema: No Mottled: No Pallor: No Rubor: No Temperature: No Abnormality N/A N/A Tenderness on Palpation: No N/A N/A Wound Preparation: Ulcer Cleansing: N/A N/A Rinsed/Irrigated with  Saline Topical Anesthetic Applied: Other: lidocaine 4% Treatment Notes Electronic Signature(s) Signed: 10/31/2017 5:00:45 PM By: Montey Hora Entered By: Montey Hora on 10/31/2017 08:58:41 Raymond [name] ([id number]) -------------------------------------------------------------------------------- [place] Details Patient Name: Raymond [name]. Date of Service: 10/31/2017 8:30 AM Medical Record Number: [id number] Patient Account Number: [id number] Date of Birth/Sex: 1965/05/25 (52 y.o. M) Treating RN: Montey Hora Primary Care Andrae Claunch: [name] Other Clinician: Referring Zandra Lajeunesse: [name] Treating Adar Rase/Extender: Melburn Hake, HOYT Weeks in Treatment: 8 Active Inactive ` Abuse / Safety / Falls / Self Care Management Nursing Diagnoses: Potential for falls Goals: Patient will not experience any injury related to falls Date Initiated: 09/05/2017 Target Resolution Date: 12/09/2017 Goal Status: Active Interventions: Assess fall risk on admission and as needed Assess: immobility, friction, shearing, incontinence upon admission and as needed Assess impairment of mobility on admission and as needed per policy Assess personal safety and home safety (as indicated) on admission and as needed Assess self care needs on admission and as needed Notes: ` Nutrition Nursing Diagnoses: Imbalanced nutrition Impaired glucose control: actual or potential Potential for alteratiion in Nutrition/Potential for imbalanced nutrition Goals: Patient/caregiver agrees to and verbalizes understanding of need to use nutritional supplements and/or vitamins as prescribed Date Initiated: 09/05/2017 Target Resolution Date: 01/06/2018 Goal Status: Active Patient/caregiver will maintain therapeutic glucose control Date Initiated: 09/05/2017 Target Resolution Date: 01/06/2018 Goal Status: Active Interventions: Assess patient nutrition upon admission and as  needed per policy Provide education on elevated blood sugars and impact on wound healing Provide education on nutrition Notes: Raymond [name], Raymond [name] ([id number]) Orientation to the Wound Care Program Nursing Diagnoses: Knowledge deficit related to the wound healing center program Goals: Patient/caregiver will verbalize understanding of the [place] Date Initiated: 09/05/2017 Target Resolution Date: 10/07/2017 Goal Status: Active Interventions: Provide education on orientation to the wound center Notes: ` Wound/Skin Impairment Nursing Diagnoses: Impaired tissue integrity Knowledge deficit related to ulceration/compromised skin integrity Goals: Ulcer/skin breakdown will have a volume reduction of 80% by week 12 Date Initiated: 09/05/2017 Target Resolution Date: 01/06/2018 Goal Status: Active Interventions: Assess patient/caregiver ability to perform ulcer/skin care regimen upon admission and as needed Assess ulceration(s) every visit Notes: Electronic Signature(s) Signed: 10/31/2017 5:00:45 PM By: Montey Hora Entered By: Montey Hora on 10/31/2017 08:58:31 Raymond [name] ([id number]) -------------------------------------------------------------------------------- Pain Assessment Details Patient Name: Raymond [name]. Date of Service: 10/31/2017 8:30 AM Medical Record Number: [id number] Patient Account Number: [id number] Date of Birth/Sex: 04-Mar-1965 (52 y.o. M) Treating RN: Montey Hora Primary Care Edker Punt: [name] Other Clinician: Referring Ahmar Pickrell: [name] Treating Airyanna Dipalma/Extender: STONE III, HOYT Weeks in Treatment: 8 Active Problems Location of Pain Severity and Description of Pain Patient Has Paino No Site Locations Pain  Management and Medication Current Pain Management: Electronic Signature(s) Signed: 10/31/2017 8:47:37 AM By: Lorine Bears RCP, RRT, CHT Signed: 10/31/2017 5:00:45 PM By:  Montey Hora Entered By: Lorine Bears on 10/31/2017 08:26:46 Raymond [name] ([id number]) -------------------------------------------------------------------------------- Patient/Caregiver Education Details Patient Name: Raymond [name]. Date of Service: 10/31/2017 8:30 AM Medical Record Number: [id number] Patient Account Number: [id number] Date of Birth/Gender: [birth year] (52 y.o. M) Treating RN: Montey Hora Primary Care Physician: [name] Other Clinician: Referring Physician: [name] Treating Physician/Extender: Sharalyn Ink in Treatment: 8 Education Assessment Education Provided To: Patient Education Topics Provided Offloading: Handouts: Other: continue offloading as much as possible Methods: Explain/Verbal Responses: State content correctly Wound/Skin Impairment: Handouts: Other: wound care as ordered Methods: Demonstration, Explain/Verbal Responses: State content correctly Electronic Signature(s) Signed: 10/31/2017 5:00:45 PM By: Montey Hora Entered By: Montey Hora on 10/31/2017 09:00:36 Raymond [name] ([id number]) -------------------------------------------------------------------------------- Wound Assessment Details Patient Name: Raymond [name]. Date of Service: 10/31/2017 8:30 AM Medical Record Number: [id number] Patient Account Number: [id number] Date of Birth/Sex: Sep 04, 1965 (52 y.o. M) Treating RN: Cornell Barman Primary Care Vieva Brummitt: [name] Other Clinician: Referring Leeanne Butters: [name] Treating Khelani Kops/Extender: Melburn Hake, HOYT Weeks in Treatment: 8 Wound Status Wound Number: 1 Primary Etiology: Diabetic Wound/Ulcer of the Lower Extremity Wound Location: Left Metatarsal head first - Distal Wound Status: Open Wounding Event: Gradually Appeared Comorbid Type II Diabetes Date Acquired: 07/11/2017 History: Weeks Of Treatment: 8 Clustered Wound: No Photos Photo  Uploaded By: Gretta Cool, BSN, RN, CWS, Kim on 10/31/2017 09:19:32 Wound Measurements Length: (cm) 1.4 Width: (cm) 0.5 Depth: (cm) 0.3 Area: (cm) 0.55 Volume: (cm) 0.165 % Reduction in Area: 22.2% % Reduction in Volume: -17% Epithelialization: None Tunneling: No Undermining: No Wound Description Classification: Grade 2 Wound Margin: Indistinct, nonvisible Exudate Amount: Small Exudate Type: Sanguinous Exudate Color: red Foul Odor After Cleansing: No Slough/Fibrino No Wound Bed Granulation Amount: Medium (34-66%) Exposed Structure Granulation Quality: Pink, Pale Fascia Exposed: No Necrotic Amount: Medium (34-66%) Fat Layer (Subcutaneous Tissue) Exposed: Yes Necrotic Quality: Adherent Slough Tendon Exposed: No Muscle Exposed: No Joint Exposed: No Bone Exposed: No Periwound Skin Texture Raymond [name], Raymond [name] ([id number]) Texture Color No Abnormalities Noted: No No Abnormalities Noted: No Callus: Yes Atrophie Blanche: No Crepitus: No Cyanosis: No Excoriation: No Ecchymosis: No Induration: No Erythema: No Rash: No Hemosiderin Staining: Yes Scarring: No Mottled: No Pallor: No Moisture Rubor: No No Abnormalities Noted: No Dry / Scaly: No Temperature / Pain Maceration: Yes Temperature: No Abnormality Wound Preparation Ulcer Cleansing: Rinsed/Irrigated with Saline Topical Anesthetic Applied: Other: lidocaine 4%, Treatment Notes Wound #1 (Left, Distal Metatarsal head first) 4. Dressing Applied: Calcium Alginate with Silver 5. Secondary Dressing Applied Kerlix/Conform 7. Secured with Tape Notes compression sock with offloading shoe peg assist Electronic Signature(s) Signed: 10/31/2017 4:33:57 PM By: Gretta Cool, BSN, RN, CWS, Kim RN, BSN Entered By: Gretta Cool, BSN, RN, CWS, Kim on 10/31/2017 08:37:24 Raymond [name] ([id number]) -------------------------------------------------------------------------------- [place] Details Patient Name: Raymond [name]. Date of Service: 10/31/2017 8:30 AM Medical Record Number: [id number] Patient Account Number: [id number] Date of Birth/Sex: 08/03/1965 (52 y.o. M) Treating RN: Montey Hora Primary Care Saysha Menta: [name] Other Clinician: Referring Kenniya Westrich: [name] Treating Chasty Randal/Extender: STONE III, HOYT Weeks in Treatment: 8 Vital Signs Time Taken: 08:26 Temperature (F): 98.4 Height (in): 74 Pulse (bpm): 79 Weight (lbs): 381 Respiratory Rate (breaths/min): 18 Body Mass Index (BMI): 48.9 Blood Pressure (mmHg): 143/71 Reference Range: 80 - 120 mg / dl Electronic Signature(s) Signed: 10/31/2017 8:47:37 AM By: Juleen China, RCP,RRT,CHT,  Sallie RCP, RRT, CHT Entered By: Lorine Bears on 10/31/2017 08:29:37

## 2017-11-14 ENCOUNTER — Encounter: Payer: Medicare PPO | Admitting: Physician Assistant

## 2017-11-14 DIAGNOSIS — E11621 Type 2 diabetes mellitus with foot ulcer: Secondary | ICD-10-CM | POA: Diagnosis not present

## 2017-11-17 NOTE — Progress Notes (Addendum)
Raymond [name], Raymond [name] ([id number]) Visit Report for 11/14/2017 Chief Complaint Document Details Patient Name: Raymond [name], Raymond [name]. Date of Service: 11/14/2017 9:15 AM Medical Record Number: [id number] Patient Account Number: [id number] Date of Birth/Sex: 04-11-1965 (52 y.o. M) Treating RN: Montey Hora Primary Care Provider: [name] Other Clinician: Referring Provider: [name] Treating Provider/Extender: Melburn Hake, HOYT Weeks in Treatment: 10 Information Obtained from: Patient Chief Complaint left foot Electronic Signature(s) Signed: 11/15/2017 11:13:36 AM By: [name] PA-C Entered By: [name] on 11/14/2017 09:45:04 Raymond [name] ([id number]) -------------------------------------------------------------------------------- HPI Details Patient Name: Raymond [name]. Date of Service: 11/14/2017 9:15 AM Medical Record Number: [id number] Patient Account Number: [id number] Date of Birth/Sex: Aug 31, 1965 (52 y.o. M) Treating RN: Montey Hora Primary Care Provider: [name] Other Clinician: Referring Provider: [name] Treating Provider/Extender: Melburn Hake, HOYT Weeks in Treatment: 10 History of Present Illness HPI Description: 09/05/17-He is seeing an initial evaluation for a left plantar foot ulcer. He has a remote history of left great toe amputation. He states that 4-6 weeks ago he noted callus formation and ulceration. He has not seen primary care regarding this. He is not currently on antibiotic therapy. He does not routinely follow with podiatry. He states diabetic foot wear will arrive early next week. The EHR shows an A1c of 9% approximate 4 months ago but he states he had one and primary care a few weeks ago but does not know the results. He is neuropathic and does not complain of any pain, he is currently wearing crocs. 09/12/17-he is here in follow up evaluation for left plantar foot ulcer. There is improvement in  both appearance and measurement. We will continue with same treatment plan and he will follow up next week. 09/19/17 on evaluation today patient actually appears to be doing excellent in regard to his ulcer on the invitation site of his left great foot plantar aspect. He's been tolerating the dressing changes without complication. In fact with the Prisma and the current measures he has been shown signs of excellent improvement week by week up to this point. We have been to breeding the wound in this seems to have been of great benefit for him. Fortunately there is no evidence of infection. 09/26/17 on evaluation today patient appears to be doing rather well in regard to his wound. He did not know quite as much improvement this week as compared to last week. Nonetheless he still continues to show signs of improving to some degree. I do believe he may benefit from an offloading shoe. No fevers, chills, nausea, or vomiting noted at this time. 10/03/17 on evaluation today patient actually appears to be doing much better in regard to the amputation site plantar foot ulcer. Overall this appears significantly smaller even compared to previous. He's been tolerating the dressing changes without complication. He did get his diabetic shoes and I did have a look at them they appear to be fairly good. Nonetheless I do think that for the time being I would probably recommend he continue with the offloading shoe that we have been utilizing just due to the fact that with the dressing I don't know that his diabetic shoes are going to work as appropriately as far as not called an additional pressure and irritation to the area in question. He understands. 10/10/17 on evaluation today patient actually appears to be doing very well in regard to his plantar foot ulcer. He has been tolerating the dressing changes without complication. I do feel like he's making  signs of good improvement and in fact of the wound bed appears to  be better although it may not be significantly changed in size it appears healthier and I do believe is showing signs of improving. Nonetheless we gonna keep working towards healing as far as that is concerned. There is no evidence of infection. 10/17/17 on evaluation today patient actually appears to be doing poorly in regard to his plantar foot ulcer. Unfortunately other than just the area where the wound is itself there appears to be a blister that communicates with the wound unfortunately. This is more lateral to the wound itself and also to the distal point of the amputation site. There does not appear to be any evidence of cellulitis spreading at the foot but I do believe some of the drainage from the site itself is. When in nature. Nonetheless this blister area I think needs to be removed in order to allow for appropriate healing of the ulcer itself I think that is gonna be difficult for it to heal otherwise. This was discussed with the patient today. 10/24/17 on evaluation today patient actually appears to be doing better compared to last week even post debridement. He has shown signs of improvement he did test positive for Staphylococcus aureus. With that being said he notes that he's not have any discomfort and in general he does feel like things seem to been doing better. Fortunately there's no additional blistering and no evidence of remaining infection at this time. No fevers, chills, nausea, or vomiting noted at this time. He has three days of antibiotic left. 10/31/17 on evaluation today patient actually appears to be doing very well in regard to his ulcer on the left foot. He has been tolerating the dressing changes without complication. With that being said fortunately there appears to be no evidence of infection I do think he's made progress compared to previous. No fevers, chills, nausea, or vomiting noted at this time. Raymond [name], Raymond [name] ([id number]) 11/14/17 on evaluation  today patient actually appears to be doing excellent in regard to his amputation site ulcer on his foot. In fact this appears to be completely healed at this point. There is no evidence of infection nor underline abscess and I did thoroughly evaluate the periwound location. Overall I'm very pleased with how things appear. Electronic Signature(s) Signed: 11/16/2017 5:08:41 PM By: [name] PA-C Entered By: [name] on 11/16/2017 10:25:59 Raymond [name] ([id number]) -------------------------------------------------------------------------------- Physical Exam Details Patient Name: Raymond [name]. Date of Service: 11/14/2017 9:15 AM Medical Record Number: [id number] Patient Account Number: [id number] Date of Birth/Sex: [birth year] (52 y.o. M) Treating RN: Montey Hora Primary Care Provider: [name] Other Clinician: Referring Provider: [name] Treating Provider/Extender: STONE III, HOYT Weeks in Treatment: 10 Constitutional Well-nourished and well-hydrated in no acute distress. Respiratory normal breathing without difficulty. clear to auscultation bilaterally. Cardiovascular regular rate and rhythm with normal S1, S2. Psychiatric this patient is able to make decisions and demonstrates good insight into disease process. Alert and Oriented x 3. pleasant and cooperative. Notes Upon inspection of the wound site there appears to be no residual opening this shows complete epithelialization. Electronic Signature(s) Signed: 11/16/2017 5:08:41 PM By: [name] PA-C Entered By: [name] on 11/16/2017 10:28:48 Raymond [name] ([id number]) -------------------------------------------------------------------------------- Physician Orders Details Patient Name: Raymond [name]. Date of Service: 11/14/2017 9:15 AM Medical Record Number: [id number] Patient Account Number: [id number] Date of Birth/Sex: 1966/01/14 (52 y.o.  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 Date of Service: 11/14/2017 9:15 AM Medical Record Number: [id number] Patient Account Number: [id number] Date of Birth/Sex: Aug 06, 1965 (52 y.o. M) Treating RN: Montey Hora Primary Care Provider: [name] Other Clinician: Referring Provider: [name] Treating Provider/Extender: Melburn Hake, HOYT Weeks in Treatment: 10 Active Problems ICD-10 Evaluated Encounter Code Description Active Date Today Diagnosis L97.522 Non-pressure chronic ulcer of other part of left foot with fat 09/05/2017 No Yes layer exposed E11.621 Type 2 diabetes mellitus with foot ulcer 09/05/2017 No Yes Inactive Problems Resolved Problems Electronic Signature(s) Signed: 11/15/2017 11:13:36 AM By: [name] PA-C Entered By: [name] on 11/14/2017 09:44:59 Raymond [name] ([id number]) -------------------------------------------------------------------------------- Progress Note Details Patient Name: Raymond [name]. Date of Service: 11/14/2017 9:15 AM Medical Record Number: [id number] Patient Account Number: [id number] Date of Birth/Sex: 02/07/1965 (52 y.o.  M) Treating RN: Montey Hora Primary Care Provider: [name] Other Clinician: Referring Provider: [name] Treating Provider/Extender: Melburn Hake, HOYT Weeks in Treatment: 10 Subjective Chief Complaint Information obtained from Patient left foot History of Present Illness (HPI) 09/05/17-He is seeing an initial evaluation for a left plantar foot ulcer. He has a remote history of left great toe amputation. He states that 4-6 weeks ago he noted callus formation and ulceration. He has not seen primary care regarding this. He is not currently on antibiotic therapy. He does not routinely follow with podiatry. He states diabetic foot wear will arrive early next week. The EHR shows an A1c of 9% approximate 4 months ago but he states he had one and primary care a few weeks ago but does not know the results. He is neuropathic and does not complain of any pain, he is currently wearing crocs. 09/12/17-he is here in follow up evaluation for left plantar foot ulcer. There is improvement in both appearance and measurement. We will continue with same treatment plan and he will follow up next week. 09/19/17 on evaluation today patient actually appears to be doing excellent in regard to his ulcer on the invitation site of his left great foot plantar aspect. He's been tolerating the dressing changes without complication. In fact with the Prisma and the current measures he has been shown signs of excellent improvement week by week up to this point. We have been to breeding the wound in this seems to have been of great benefit for him. Fortunately there is no evidence of infection. 09/26/17 on evaluation today patient appears to be doing rather well in regard to his wound. He did not know quite as much improvement this week as compared to last week. Nonetheless he still continues to show signs of improving to some degree. I do believe he may benefit from an offloading shoe. No fevers, chills, nausea, or  vomiting noted at this time. 10/03/17 on evaluation today patient actually appears to be doing much better in regard to the amputation site plantar foot ulcer. Overall this appears significantly smaller even compared  to previous. He's been tolerating the dressing changes without complication. He did get his diabetic shoes and I did have a look at them they appear to be fairly good. Nonetheless I do think that for the time being I would probably recommend he continue with the offloading shoe that we have been utilizing just due to the fact that with the dressing I don't know that his diabetic shoes are going to work as appropriately as far as not called an additional pressure and irritation to the area in question. He understands. 10/10/17 on evaluation today patient actually appears to be doing very well in regard to his plantar foot ulcer. He has been tolerating the dressing changes without complication. I do feel like he's making signs of good improvement and in fact of the wound bed appears to be better although it may not be significantly changed in size it appears healthier and I do believe is showing signs of improving. Nonetheless we gonna keep working towards healing as far as that is concerned. There is no evidence of infection. 10/17/17 on evaluation today patient actually appears to be doing poorly in regard to his plantar foot ulcer. Unfortunately other than just the area where the wound is itself there appears to be a blister that communicates with the wound unfortunately. This is more lateral to the wound itself and also to the distal point of the amputation site. There does not appear to be any evidence of cellulitis spreading at the foot but I do believe some of the drainage from the site itself is. When in nature. Nonetheless this blister area I think needs to be removed in order to allow for appropriate healing of the ulcer itself I think that is gonna be difficult for it to heal  otherwise. This was discussed with the patient today. 10/24/17 on evaluation today patient actually appears to be doing better compared to last week even post debridement. He has shown signs of improvement he did test positive for Staphylococcus aureus. With that being said he notes that he's not have any discomfort and in general he does feel like things seem to been doing better. Fortunately there's no additional blistering and no evidence of remaining infection at this time. No fevers, chills, nausea, or vomiting noted at this time. He has three Raymond [name], Raymond [name] ([id number]) days of antibiotic left. 10/31/17 on evaluation today patient actually appears to be doing very well in regard to his ulcer on the left foot. He has been tolerating the dressing changes without complication. With that being said fortunately there appears to be no evidence of infection I do think he's made progress compared to previous. No fevers, chills, nausea, or vomiting noted at this time. 11/14/17 on evaluation today patient actually appears to be doing excellent in regard to his amputation site ulcer on his foot. In fact this appears to be completely healed at this point. There is no evidence of infection nor underline abscess and I did thoroughly evaluate the periwound location. Overall I'm very pleased with how things appear. Patient History Information obtained from Patient. Family History Diabetes - Mother,Maternal Grandparents, Hypertension - Father, Stroke - Father, No family history of Cancer, Heart Disease, Hereditary Spherocytosis, Kidney Disease, Lung Disease, Seizures, Thyroid Problems, Tuberculosis. Social History Never smoker, Marital Status - Divorced, Alcohol Use - Rarely, Drug Use - No History, Caffeine Use - Never. Review of Systems (ROS) Constitutional Symptoms (General Health) Denies complaints or symptoms of Fever, Chills. Respiratory The patient has no complaints or  symptoms. Cardiovascular The patient has no complaints or symptoms. Psychiatric The patient has no complaints or symptoms. Objective Constitutional Well-nourished and well-hydrated in no acute distress. Vitals Time Taken: 9:22 AM, Height: 74 in, Weight: 381 lbs, BMI: 48.9, Temperature: 97.6 F, Pulse: 70 bpm, Respiratory Rate: 18 breaths/min, Blood Pressure: 137/60 mmHg. Respiratory normal breathing without difficulty. clear to auscultation bilaterally. Cardiovascular regular rate and rhythm with normal S1, S2. Psychiatric this patient is able to make decisions and demonstrates good insight into disease process. Alert and Oriented x 3. pleasant and cooperative. Raymond [name], Raymond [name] ([id number]) General Notes: Upon inspection of the wound site there appears to be no residual opening this shows complete epithelialization. Integumentary (Hair, Skin) Wound #1 status is Healed - Epithelialized. Original cause of wound was Gradually Appeared. The wound is located on the Left,Distal Metatarsal head first. The wound measures 0cm length x 0cm width x 0cm depth; 0cm^2 area and 0cm^3 volume. There is Fat Layer (Subcutaneous Tissue) Exposed exposed. There is no tunneling or undermining noted. There is a none present amount of drainage noted. The wound margin is indistinct and nonvisible. There is no granulation within the wound bed. There is no necrotic tissue within the wound bed. The periwound skin appearance exhibited: Callus, Maceration, Hemosiderin Staining. The periwound skin appearance did not exhibit: Crepitus, Excoriation, Induration, Rash, Scarring, Dry/Scaly, Atrophie Blanche, Cyanosis, Ecchymosis, Mottled, Pallor, Rubor, Erythema. Periwound temperature was noted as No Abnormality. 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 Services: Discharge from  [place] recommend currently that we continue with the above wound care measures specifically the offloading for another month and then he can start wearing his normal footwear at that point. I want him to keep a very close eye on this area however to ensure that it does not reopen. He's in agreement with plan. Otherwise we gonna discharge wound care services at this point. Electronic Signature(s) Signed: 11/16/2017 5:08:41 PM By: [name] PA-C Entered By: [name] on 11/16/2017 10:29:01 Raymond [name] ([id number]) -------------------------------------------------------------------------------- ROS/PFSH Details Patient Name: Raymond [name]. Date of Service: 11/14/2017 9:15 AM Medical Record Number: [id number] Patient Account Number: [id number] Date of Birth/Sex: 25-Oct-1965 (52 y.o. M) Treating RN: Montey Hora Primary Care Provider: [name] Other Clinician: Referring Provider: [name] Treating Provider/Extender: Melburn Hake, HOYT Weeks in Treatment: 10 Information Obtained From Patient Wound History Do you currently have one or more open woundso Yes How many open wounds do you currently haveo 1 Approximately how long have you had your woundso 3 weeks How have you been treating your wound(s) until nowo clened and bandaid Has your wound(s) ever healed and then re-openedo No Have you had any lab work done in the past montho No Have you tested positive for an antibiotic resistant organism (MRSA, VRE)o No Have you tested positive for osteomyelitis (bone infection)o No Have you had any tests for circulation on your legso No Constitutional Symptoms (General Health) Complaints and Symptoms: Negative for: Fever; Chills Respiratory Complaints and Symptoms: No Complaints or Symptoms Cardiovascular Complaints and Symptoms: No Complaints or Symptoms Endocrine Medical History: Positive for: Type II Diabetes Negative for:  Type I Diabetes Time with diabetes: 15 years Treated with: Insulin, Oral agents Blood sugar tested every day: Yes Tested : PRN Psychiatric Complaints and Symptoms: No Complaints or Symptoms Immunizations Pneumococcal Vaccine: Received Pneumococcal Vaccination: No Tetanus Vaccine: Last  tetanus shot: 09/05/2016 Raymond [name], Raymond [name] ([id number]) Implantable Devices Family and Social History Cancer: No; Diabetes: Yes - Mother,Maternal Grandparents; Heart Disease: No; Hereditary Spherocytosis: No; Hypertension: Yes - Father; Kidney Disease: No; Lung Disease: No; Seizures: No; Stroke: Yes - Father; Thyroid Problems: No; Tuberculosis: No; Never smoker; Marital Status - Divorced; Alcohol Use: Rarely; Drug Use: No History; Caffeine Use: Never; Financial Concerns: No; Food, Clothing or Shelter Needs: No; Support System Lacking: No; Transportation Concerns: No; Advanced Directives: Yes - glenda (Not Provided); Patient does not want information on Advanced Directives; Do not resuscitate: No Physician Affirmation I have reviewed and agree with the above information. Electronic Signature(s) Signed: 11/16/2017 5:08:41 PM By: [name] PA-C Signed: 11/16/2017 5:25:24 PM By: Montey Hora Entered By: [name] on 11/16/2017 10:26:18 Raymond [name] ([id number]) -------------------------------------------------------------------------------- SuperBill Details Patient Name: Raymond [name]. Date of Service: 11/14/2017 Medical Record Number: [id number] Patient Account Number: [id number] Date of Birth/Sex: [birth year] (52 y.o. M) Treating RN: Montey Hora Primary Care Provider: [name] Other Clinician: Referring Provider: [name] Treating Provider/Extender: Melburn Hake, HOYT Weeks in Treatment: 10 Diagnosis Coding ICD-10 Codes Code Description [id number] Non-pressure chronic ulcer of other part of left foot with fat layer exposed E11.621 Type 2 diabetes  mellitus with foot ulcer Facility Procedures CPT4 Code: [id number] Description: [phone] - WOUND CARE VISIT-LEV 2 EST PT Modifier: Quantity: 1 Physician Procedures CPT4 Code: [id number] Description: [id number] - WC PHYS LEVEL 2 - EST PT ICD-10 Diagnosis Description L97.522 Non-pressure chronic ulcer of other part of left foot with fat E11.621 Type 2 diabetes mellitus with foot ulcer Modifier: layer exposed Quantity: 1 Electronic Signature(s) Signed: 11/15/2017 11:13:36 AM By: [name] PA-C Entered By: [name] on 11/15/2017 10:42:55

## 2017-11-17 NOTE — Progress Notes (Signed)
JHETT, FRETWELL ([id number]) Visit Report for 11/14/2017 Arrival Information Details Patient Name: Raymond [name], Raymond [name]. Date of Service: 11/14/2017 9:15 AM Medical Record Number: [id number] Patient Account Number: [id number] Date of Birth/Sex: 1965-05-16 (52 y.o. M) Treating RN: Secundino Ginger Primary Care Jacier Gladu: [name] Other Clinician: Referring Acire Tang: [name] Treating Ayelen Sciortino/Extender: Melburn Hake, HOYT Weeks in Treatment: 10 Visit Information History Since Last Visit Added or deleted any medications: No Patient Arrived: Ambulatory Any new allergies or adverse reactions: No Arrival Time: 09:19 Had a fall or experienced change in No Accompanied By: self activities of daily living that may affect Transfer Assistance: None risk of falls: Patient Identification Verified: Yes Signs or symptoms of abuse/neglect since last visito No Secondary Verification Process Completed: Yes Hospitalized since last visit: No Patient Has Alerts: Yes Implantable device outside of the clinic excluding No Patient Alerts: DMII cellular tissue based products placed in the center since last visit: Has Dressing in Place as Prescribed: Yes Pain Present Now: No Electronic Signature(s) Signed: 11/14/2017 3:28:14 PM By: Secundino Ginger Entered By: Secundino Ginger on 11/14/2017 09:21:58 Raymond [name] ([id number]) -------------------------------------------------------------------------------- Clinic Level of Care Assessment Details Patient Name: Raymond [name]. Date of Service: 11/14/2017 9:15 AM Medical Record Number: [id number] Patient Account Number: [id number] Date of Birth/Sex: Oct 19, 1965 (52 y.o. M) Treating RN: Montey Hora Primary Care Aceton Kinnear: [name] Other Clinician: Referring Artist Bloom: [name] Treating Tahra Hitzeman/Extender: Melburn Hake, HOYT Weeks in Treatment: 10 Clinic Level of Care Assessment Items TOOL 4 Quantity Score []  - Use when only an  EandM is performed on FOLLOW-UP visit 0 ASSESSMENTS - Nursing Assessment / Reassessment X - Reassessment of Co-morbidities (includes updates in patient status) 1 10 X- 1 5 Reassessment of Adherence to Treatment Plan ASSESSMENTS - Wound and Skin Assessment / Reassessment X - Simple Wound Assessment / Reassessment - one wound 1 5 []  - 0 Complex Wound Assessment / Reassessment - multiple wounds []  - 0 Dermatologic / Skin Assessment (not related to wound area) ASSESSMENTS - Focused Assessment []  - Circumferential Edema Measurements - multi extremities 0 []  - 0 Nutritional Assessment / Counseling / Intervention X- 1 5 Lower Extremity Assessment (monofilament, tuning fork, pulses) []  - 0 Peripheral Arterial Disease Assessment (using hand held doppler) ASSESSMENTS - Ostomy and/or Continence Assessment and Care []  - Incontinence Assessment and Management 0 []  - 0 Ostomy Care Assessment and Management (repouching, etc.) PROCESS - Coordination of Care X - Simple Patient / Family Education for ongoing care 1 15 []  - 0 Complex (extensive) Patient / Family Education for ongoing care []  - 0 Staff obtains Programmer, systems, Records, Test Results / Process Orders []  - 0 Staff telephones HHA, Nursing Homes / Clarify orders / etc []  - 0 Routine Transfer to another Facility (non-emergent condition) []  - 0 Routine Hospital Admission (non-emergent condition) []  - 0 New Admissions / Biomedical engineer / Ordering NPWT, Apligraf, etc. []  - 0 Emergency Hospital Admission (emergent condition) X- 1 10 Simple Discharge Coordination Raymond [name], Raymond [name] ([id number]) []  - 0 Complex (extensive) Discharge Coordination PROCESS - Special Needs []  - Pediatric / Minor Patient Management 0 []  - 0 Isolation Patient Management []  - 0 Hearing / Language / Visual special needs []  - 0 Assessment of Community assistance (transportation, D/C planning, etc.) []  - 0 Additional assistance / Altered  mentation []  - 0 Support Surface(s) Assessment (bed, cushion, seat, etc.) INTERVENTIONS - Wound Cleansing / Measurement X - Simple Wound Cleansing - one wound 1 5 []  - 0 Complex Wound Cleansing -  multiple wounds X- 1 5 Wound Imaging (photographs - any number of wounds) []  - 0 Wound Tracing (instead of photographs) X- 1 5 Simple Wound Measurement - one wound []  - 0 Complex Wound Measurement - multiple wounds INTERVENTIONS - Wound Dressings []  - Small Wound Dressing one or multiple wounds 0 []  - 0 Medium Wound Dressing one or multiple wounds []  - 0 Large Wound Dressing one or multiple wounds []  - 0 Application of Medications - topical []  - 0 Application of Medications - injection INTERVENTIONS - Miscellaneous []  - External ear exam 0 []  - 0 Specimen Collection (cultures, biopsies, blood, body fluids, etc.) []  - 0 Specimen(s) / Culture(s) sent or taken to Lab for analysis []  - 0 Patient Transfer (multiple staff / Civil Service fast streamer / Similar devices) []  - 0 Simple Staple / Suture removal (25 or less) []  - 0 Complex Staple / Suture removal (26 or more) []  - 0 Hypo / Hyperglycemic Management (close monitor of Blood Glucose) []  - 0 Ankle / Brachial Index (ABI) - do not check if billed separately X- 1 5 Vital Signs Raymond [name], Raymond [name] ([id number]) Has the patient been seen at the hospital within the last three years: Yes Total Score: 70 Level Of Care: New/Established - Level 2 Electronic Signature(s) Signed: 11/14/2017 5:26:03 PM By: Montey Hora Entered By: Montey Hora on 11/14/2017 10:12:35 Raymond [name] ([id number]) -------------------------------------------------------------------------------- Encounter Discharge Information Details Patient Name: Raymond [name]. Date of Service: 11/14/2017 9:15 AM Medical Record Number: [id number] Patient Account Number: [id number] Date of Birth/Sex: 1965/04/29 (52 y.o. M) Treating RN: Montey Hora Primary Care  Aaren Krog: [name] Other Clinician: Referring Elvie Maines: [name] Treating Yarethzy Croak/Extender: Melburn Hake, HOYT Weeks in Treatment: 10 Encounter Discharge Information Items Discharge Condition: Stable Ambulatory Status: Ambulatory Discharge Destination: Home Transportation: Private Auto Accompanied By: self Schedule Follow-up Appointment: No Clinical Summary of Care: Electronic Signature(s) Signed: 11/14/2017 10:15:04 AM By: Montey Hora Entered By: Montey Hora on 11/14/2017 10:15:03 Raymond [name] ([id number]) -------------------------------------------------------------------------------- Lower Extremity Assessment Details Patient Name: Raymond [name]. Date of Service: 11/14/2017 9:15 AM Medical Record Number: [id number] Patient Account Number: [id number] Date of Birth/Sex: [birth year] (52 y.o. M) Treating RN: Secundino Ginger Primary Care Jamyria Ozanich: [name] Other Clinician: Referring Lanett Lasorsa: [name] Treating Deane Wattenbarger/Extender: Melburn Hake, HOYT Weeks in Treatment: 10 Electronic Signature(s) Signed: 11/14/2017 3:28:14 PM By: Secundino Ginger Entered By: Secundino Ginger on 11/14/2017 09:23:48 Raymond [name] ([id number]) -------------------------------------------------------------------------------- [place] Details Patient Name: Raymond [name]. Date of Service: 11/14/2017 9:15 AM Medical Record Number: [id number] Patient Account Number: [id number] Date of Birth/Sex: October 10, 1965 (52 y.o. M) Treating RN: Montey Hora Primary Care Medora Roorda: [name] Other Clinician: Referring Eldon Zietlow: [name] Treating Tyara Dassow/Extender: Melburn Hake, HOYT Weeks in Treatment: 10 Active Inactive Electronic Signature(s) Signed: 11/14/2017 10:11:54 AM By: Montey Hora Entered By: Montey Hora on 11/14/2017 10:11:53 Raymond [name]  ([id number]) -------------------------------------------------------------------------------- Pain Assessment Details Patient Name: Raymond [name]. Date of Service: 11/14/2017 9:15 AM Medical Record Number: [id number] Patient Account Number: [id number] Date of Birth/Sex: 05/09/1965 (52 y.o. M) Treating RN: Secundino Ginger Primary Care Niyanna Asch: [name] Other Clinician: Referring Letitia Sabala: [name] Treating Vidhi Delellis/Extender: Melburn Hake, HOYT Weeks in Treatment: 10 Active Problems Location of Pain Severity and Description of Pain Patient Has Paino No Site Locations Pain Management and Medication Current Pain Management: Goals for Pain Management pt denies any pain at this time. Electronic Signature(s) Signed: 11/14/2017 3:28:14 PM By: Secundino Ginger Entered By: Secundino Ginger on 11/14/2017 09:22:30 Raymond [name]. (  [id number]) -------------------------------------------------------------------------------- Patient/Caregiver Education Details Patient Name: Raymond [name], Raymond [name]. Date of Service: 11/14/2017 9:15 AM Medical Record Number: [id number] Patient Account Number: [id number] Date of Birth/Gender: [birth year] (52 y.o. M) Treating RN: Montey Hora Primary Care Physician: [name] Other Clinician: Referring Physician: [name] Treating Physician/Extender: Sharalyn Ink in Treatment: 10 Education Assessment Education Provided To: Patient Education Topics Provided Basic Hygiene: Handouts: Other: care of newly healed ulcer site Methods: Explain/Verbal Responses: State content correctly Electronic Signature(s) Signed: 11/14/2017 5:26:03 PM By: Montey Hora Entered By: Montey Hora on 11/14/2017 10:13:19 Raymond [name] ([id number]) -------------------------------------------------------------------------------- Wound Assessment Details Patient Name: Raymond [name]. Date of Service: 11/14/2017 9:15 AM Medical Record  Number: [id number] Patient Account Number: [id number] Date of Birth/Sex: 1965-02-20 (52 y.o. M) Treating RN: Montey Hora Primary Care Lavaun Greenfield: [name] Other Clinician: Referring Hymie Gorr: [name] Treating Jaevion Goto/Extender: STONE III, HOYT Weeks in Treatment: 10 Wound Status Wound Number: 1 Primary Etiology: Diabetic Wound/Ulcer of the Lower Extremity Wound Location: Left, Distal Metatarsal head first Wound Status: Healed - Epithelialized Wounding Event: Gradually Appeared Comorbid Type II Diabetes Date Acquired: 07/11/2017 History: Weeks Of Treatment: 10 Clustered Wound: No Photos Photo Uploaded By: Secundino Ginger on 11/14/2017 09:32:20 Wound Measurements Length: (cm) Width: (cm) Depth: (cm) Area: (cm) Volume: (cm) 0 % Reduction in Area: 100% 0 % Reduction in Volume: 100% 0 Epithelialization: None 0 Tunneling: No 0 Undermining: No Wound Description Classification: Grade 2 Wound Margin: Indistinct, nonvisible Exudate Amount: None Present Foul Odor After Cleansing: No Slough/Fibrino No Wound Bed Granulation Amount: None Present (0%) Exposed Structure Necrotic Amount: None Present (0%) Fascia Exposed: No Fat Layer (Subcutaneous Tissue) Exposed: Yes Tendon Exposed: No Muscle Exposed: No Joint Exposed: No Bone Exposed: No Periwound Skin Texture Texture Color No Abnormalities Noted: No No Abnormalities Noted: No Raymond [name], Raymond [name] ([id number]) Callus: Yes Atrophie Blanche: No Crepitus: No Cyanosis: No Excoriation: No Ecchymosis: No Induration: No Erythema: No Rash: No Hemosiderin Staining: Yes Scarring: No Mottled: No Pallor: No Moisture Rubor: No No Abnormalities Noted: No Dry / Scaly: No Temperature / Pain Maceration: Yes Temperature: No Abnormality Wound Preparation Ulcer Cleansing: Rinsed/Irrigated with Saline Electronic Signature(s) Signed: 11/14/2017 5:26:03 PM By: Montey Hora Entered By: Montey Hora on 11/14/2017  09:47:23 Raymond [name] ([id number]) -------------------------------------------------------------------------------- Vitals Details Patient Name: Raymond [name]. Date of Service: 11/14/2017 9:15 AM Medical Record Number: [id number] Patient Account Number: [id number] Date of Birth/Sex: [birth year] (52 y.o. M) Treating RN: Secundino Ginger Primary Care Jo Booze: [name] Other Clinician: Referring Chimene Salo: [name] Treating Breleigh Carpino/Extender: STONE III, HOYT Weeks in Treatment: 10 Vital Signs Time Taken: 09:22 Temperature (F): 97.6 Height (in): 74 Pulse (bpm): 70 Weight (lbs): 381 Respiratory Rate (breaths/min): 18 Body Mass Index (BMI): 48.9 Blood Pressure (mmHg): 137/60 Reference Range: 80 - 120 mg / dl Electronic Signature(s) Signed: 11/14/2017 3:28:14 PM By: Secundino Ginger Entered By: Secundino Ginger on 11/14/2017 09:23:40

## 2018-01-04 ENCOUNTER — Encounter: Payer: Self-pay | Admitting: Podiatry

## 2018-01-04 ENCOUNTER — Ambulatory Visit: Payer: Medicare PPO | Admitting: Podiatry

## 2018-01-04 DIAGNOSIS — B351 Tinea unguium: Secondary | ICD-10-CM

## 2018-01-04 DIAGNOSIS — M79674 Pain in right toe(s): Secondary | ICD-10-CM

## 2018-01-04 DIAGNOSIS — S98111S Complete traumatic amputation of right great toe, sequela: Secondary | ICD-10-CM

## 2018-01-04 DIAGNOSIS — S98112S Complete traumatic amputation of left great toe, sequela: Secondary | ICD-10-CM

## 2018-01-04 DIAGNOSIS — M79675 Pain in left toe(s): Secondary | ICD-10-CM

## 2018-01-04 DIAGNOSIS — E1142 Type 2 diabetes mellitus with diabetic polyneuropathy: Secondary | ICD-10-CM | POA: Diagnosis not present

## 2018-01-04 NOTE — Progress Notes (Signed)
This patient  presents to the office with chief complaint of long thick painful nails.  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  He says his nails are painful due to the long thick nails on his remaining toes. He is unable to self treat.      He presents the office today for an evaluation and treatment of his nails.  General Appearance  Alert, conversant and in no acute stress.  Vascular  Dorsalis pedis and posterior tibial  pulses are weakly  palpable  bilaterally.  Capillary return is within normal limits  bilaterally. Temperature is within normal limits  Bilaterally. Swelling noted  B/L.  Neurologic  Senn-Weinstein monofilament wire test absent   bilaterally. Muscle power within normal limits bilaterally.  Nails Thick disfigured discolored nails with subungual debris  from 3,4,5 left and 2-5 right.. No evidence of bacterial infection or drainage bilaterally.  Orthopedic  No limitations of motion  feet .  No crepitus or effusions noted.  No bony pathology or digital deformities noted. Amputation noted hallux  B/Land second toe left foot.  Skin  normotropic skin with no porokeratosis noted bilaterally.  An open wound/ulcer sub first metatarsal left foot.  No redness or swelling or drainage noted.    Onychomycosis  B/L  Diabetes with neuropathy  Amputation  B/L  IE  Debride nails  X 7.    RTC 10 weeks  for nail care.  Timiya Howells DPM 

## 2018-03-11 ENCOUNTER — Emergency Department
Admission: EM | Admit: 2018-03-11 | Discharge: 2018-03-11 | Disposition: A | Discharge status: Home / Self Care | Payer: Medicare PPO | Attending: Emergency Medicine | Admitting: Emergency Medicine

## 2018-03-11 ENCOUNTER — Encounter: Payer: Self-pay | Admitting: Emergency Medicine

## 2018-03-11 ENCOUNTER — Other Ambulatory Visit: Payer: Self-pay

## 2018-03-11 DIAGNOSIS — E1140 Type 2 diabetes mellitus with diabetic neuropathy, unspecified: Secondary | ICD-10-CM | POA: Diagnosis not present

## 2018-03-11 DIAGNOSIS — M545 Low back pain, unspecified: Secondary | ICD-10-CM

## 2018-03-11 DIAGNOSIS — Z79899 Other long term (current) drug therapy: Secondary | ICD-10-CM | POA: Diagnosis not present

## 2018-03-11 DIAGNOSIS — I5032 Chronic diastolic (congestive) heart failure: Secondary | ICD-10-CM | POA: Insufficient documentation

## 2018-03-11 DIAGNOSIS — J449 Chronic obstructive pulmonary disease, unspecified: Secondary | ICD-10-CM | POA: Diagnosis not present

## 2018-03-11 DIAGNOSIS — I110 Hypertensive heart disease with heart failure: Secondary | ICD-10-CM | POA: Insufficient documentation

## 2018-03-11 DIAGNOSIS — Z7982 Long term (current) use of aspirin: Secondary | ICD-10-CM | POA: Diagnosis not present

## 2018-03-11 DIAGNOSIS — Z794 Long term (current) use of insulin: Secondary | ICD-10-CM | POA: Diagnosis not present

## 2018-03-11 MED ORDER — OXYCODONE-ACETAMINOPHEN 5-325 MG PO TABS: 1.0000 | ORAL_TABLET | ORAL | 0 refills | Status: DC | PRN

## 2018-03-11 MED ORDER — CYCLOBENZAPRINE HCL 5 MG PO TABS: 5.0000 mg | ORAL_TABLET | Freq: Three times a day (TID) | ORAL | 0 refills | Status: DC | PRN

## 2018-03-11 MED ORDER — HYDROMORPHONE HCL 1 MG/ML IJ SOLN
1.0000 mg | Freq: Once | INTRAMUSCULAR | Status: AC
  Administered 2018-03-11: 1 mg via INTRAMUSCULAR
  Filled 2018-03-11: qty 1

## 2018-03-11 NOTE — ED Provider Notes (Signed)
[facility] Emergency Department Provider Note  Time seen: 8:07 PM  I have reviewed the triage vital signs and the nursing notes.   HISTORY  Chief Complaint Back Pain   HPI Raymond [name] is a 53 y.o. male with a past medical history of arthritis, cellulitis, CHF, diabetes, obesity, sciatica, presents to the emergency department for left lower back pain.  According to the patient he had gotten up off the toilet and he turned around to flush the toilet at that time he developed acute pain in his left lower back, states he was "stuck" due to the pain.  States he has a history of sciatica in the past does not recall which leg.  Denies any shooting pain down the leg states all the pain is in the left lower back.  Pain is much worse with any attempted movement such as sitting up or twisting.  Denies any abdominal pain, fever, negative review of systems.   Past Medical History:  Diagnosis Date  . Arthritis    hands  . Cellulitis   . CHF (congestive heart failure) ([place])   . Chronic diastolic heart failure ([place])   . Chronic respiratory failure with hypoxia (HCC)    2 L [place] continuously  . Diabetes mellitus type 2, insulin dependent ([place])   . Edema    FEET/LEGS  . GERD (gastroesophageal reflux disease)   . Hepatic steatosis   . HOH (hard of hearing)   . Hypertension   . Morbid obesity ([place])   . Morbid obesity with BMI of 45.0-49.9, adult ([place])   . Neuropathy   . Orthopnea   . OSA on CPAP    TRILOGY VENTILATOR  . Oxygen deficiency    2L  HS  . PVD (peripheral vascular disease) ([place])   . Shortness of breath dyspnea   . Systolic heart failure (HCC)    Preserved EF 50-55%    Patient Active Problem List   Diagnosis Date Noted  . Acute on chronic respiratory failure with hypoxia ([place]) 04/28/2017  . Acute kidney injury ([place]) 04/28/2017  . Hypotension 04/28/2017  . PVD (peripheral vascular disease) ([name]) 04/28/2017  . Hypertension 04/28/2017  .  Diabetes mellitus type 2, insulin dependent ([place]) 04/28/2017  . OSA on CPAP 04/28/2017  . Chronic diastolic heart failure, NYHA class 1 ([place]) 04/28/2017  . Chest pain with moderate risk for cardiac etiology 04/28/2017  . Morbid obesity with BMI of 45.0-49.9, adult ([place]) 04/28/2017  . Lymphedema 02/11/2017  . HTN (hypertension) 02/01/2017  . Diabetes ([place]) 02/01/2017  . Pneumonia 01/13/2017  . Cellulitis of right foot 09/30/2014  . COPD, moderate ([place]) 07/07/2014  . Chronic diastolic heart failure ([place]) 05/06/2014  . OSA treated with Trilogy machine 04/28/2014  . Hypoxemia requiring supplemental oxygen 04/28/2014  . Morbid obesity ([place]) 04/28/2014  . Hyperlipidemia 07/09/2013    Past Surgical History:  Procedure Laterality Date  . AMPUTATION Right 10/02/2014   Procedure: AMPUTATION RAY;  Surgeon: Albertine Patricia, DPM;  Location: [facility] ORS;  Service: Podiatry;  Laterality: Right;  . CATARACT EXTRACTION W/PHACO Right 09/10/2015   Procedure: CATARACT EXTRACTION PHACO AND INTRAOCULAR LENS PLACEMENT (IOC);  Surgeon: Birder Robson, MD;  Location: [facility] ORS;  Service: Ophthalmology;  Laterality: Right;  [place] 01:34 AP% 19.3 CDE 18.36 Fluid pack lot # [id number]  . CATARACT EXTRACTION W/PHACO Left 10/06/2015   Procedure: CATARACT EXTRACTION PHACO AND INTRAOCULAR LENS PLACEMENT (IOC);  Surgeon: Birder Robson, MD;  Location: [facility] ORS;  Service: Ophthalmology;  Laterality: Left;  [place] 00:56 AP% 19.5 CDE 11.02 Fluid Pack # [id number]  . CHOLECYSTECTOMY    . COLONOSCOPY WITH PROPOFOL N/A 05/17/2016   Procedure: COLONOSCOPY WITH PROPOFOL;  Surgeon: Lollie Sails, MD;  Location: [facility] ENDOSCOPY;  Service: Endoscopy;  Laterality: N/A;  . EYE SURGERY Left    bilat laser  . HIP SURGERY Right    8 th grade pins  . NOSE SURGERY    . PERIPHERAL VASCULAR CATHETERIZATION Right 10/02/2014   Procedure: Lower Extremity Angiography;  Surgeon: Algernon Huxley, MD;  Location: [facility] CV LAB;  Service:  Cardiovascular;  Laterality: Right;  . TOE AMPUTATION Left 2010   2cd  . TOE AMPUTATION Right 2009    Prior to Admission medications   Medication Sig Start Date End Date Taking? Authorizing Provider  amLODipine (NORVASC) 10 MG tablet amlodipine 10 mg tablet    [provider]  aspirin EC 81 MG tablet Take 81 mg by mouth daily.    [provider]  atorvastatin (LIPITOR) 40 MG tablet atorvastatin 40 mg tablet    [provider]  carvedilol (COREG) 6.25 MG tablet Take 1 tablet (6.25 mg total) by mouth 2 (two) times daily. 04/30/17 04/30/18  Rai, Ripudeep K, MD  Dulaglutide (TRULICITY) 0.16 WF/0.9NA SOPN Inject into the skin.    [provider]  gabapentin (NEURONTIN) 300 MG capsule Take 300 mg by mouth 3 (three) times daily.    [provider]  insulin detemir (LEVEMIR) 100 UNIT/ML injection Inject 89 Units into the skin 2 (two) times daily.     [provider]  insulin lispro (HUMALOG) 100 UNIT/ML cartridge Inject 60 Units into the skin 3 (three) times daily with meals.    [provider]  losartan (COZAAR) 50 MG tablet Take 50 mg by mouth daily.    [provider]  metFORMIN (GLUCOPHAGE) 1000 MG tablet Take 1,000 mg by mouth 2 (two) times daily with a meal.    [provider]  Multiple Vitamins-Minerals (CENTRUM SILVER 50+MEN) TABS Take 1 tablet by mouth daily.    [provider]  omega-3 acid ethyl esters (LOVAZA) 1 g capsule Take 1 g by mouth daily.    [provider]  oxyCODONE-acetaminophen (PERCOCET/ROXICET) 5-325 MG tablet oxycodone-acetaminophen 5 mg-325 mg tablet    [provider]  pantoprazole (PROTONIX) 40 MG tablet Take 40 mg by mouth daily.    [provider]  torsemide (DEMADEX) 20 MG tablet Take 2 tablets (40 mg total) by mouth daily. 05/01/17   Rai, Vernelle Emerald, MD    No Known Allergies  Family History  Problem Relation Age of Onset  . Hypertension Mother   .  Diabetes Mother   . Stroke Father   . Hypertension Father   . Heart attack Father     Social History Social History   Tobacco Use  . Smoking status: Never Smoker  . Smokeless tobacco: Never Used  Substance Use Topics  . Alcohol use: No    Alcohol/week: 0.0 standard drinks  . Drug use: No    Review of Systems Constitutional: Negative for fever. Cardiovascular: Negative for chest pain. Respiratory: Negative for shortness of breath. Gastrointestinal: Negative for abdominal pain Musculoskeletal: Left lower back pain. Neurological: Negative for headache All other ROS negative  ____________________________________________   PHYSICAL EXAM:  VITAL SIGNS: ED Triage Vitals  Enc Vitals Group     BP 03/11/18 1945 (!) 164/65     Pulse Rate 03/11/18 1945 74  Resp 03/11/18 1945 18     Temp 03/11/18 1945 98.3 F (36.8 C)     Temp Source 03/11/18 1945 Oral     SpO2 03/11/18 1945 96 %     Weight 03/11/18 1947 (!) 374 lb (169.6 kg)     Height 03/11/18 1947 6\' 2"  (1.88 m)     Head Circumference --      Peak Flow --      Pain Score 03/11/18 1947 10     Pain Loc --      Pain Edu? --      Excl. in [place]? --    Constitutional: Alert and oriented. Well appearing and in no distress. Eyes: Normal exam ENT   Head: Normocephalic and atraumatic.   Mouth/Throat: Mucous membranes are moist. Cardiovascular: Normal rate, regular rhythm. Respiratory: Normal respiratory effort without tachypnea nor retractions. Breath sounds are clear Gastrointestinal: Soft and nontender. No distention.  Obese. Musculoskeletal: Left lower back moderate tenderness palpation over the SI joint. Neurologic:  Normal speech and language. No gross focal neurologic deficits.  States he has no feeling in his legs.  No leg edema.  Chronically due to neuropathy. Skin:  Skin is warm, dry and intact.  Psychiatric: Mood and affect are normal.  ____________________________________________   INITIAL IMPRESSION /  ASSESSMENT AND PLAN / ED COURSE  Pertinent labs & imaging results that were available during my care of the patient were reviewed by me and considered in my medical decision making (see chart for details).  Patient with a history of back pain presents emergency department for acute exacerbation of left lower back pain after twisting.  Patient has moderate tenderness over the left SI joint.  Largely negative review of systems otherwise.  Overall the patient appears well, pain is much worse when he attempts to sit up or turn.  Patient is diabetic, we will hold off on steroids at this time.  We will treat with pain medication in the emergency department.  As long as the patient's pain is controlled I believe the patient could safely be discharged home with pain medication and Flexeril for symptom management and have him follow-up with his primary care doctor.  Patient feels much better after pain medication.  We will discharge with Percocet and Flexeril and have the patient follow-up with his doctor.  ____________________________________________   FINAL CLINICAL IMPRESSION(S) / ED DIAGNOSES  Back pain    Harvest Dark, MD 03/11/18 2057

## 2018-03-11 NOTE — ED Triage Notes (Signed)
PT to ER via EMS has hx of chronic back pain and sciatic pain.  Pt started today with left hip pain that mimics his sciatic pain.  No known new injury.

## 2018-03-15 ENCOUNTER — Ambulatory Visit (INDEPENDENT_AMBULATORY_CARE_PROVIDER_SITE_OTHER): Payer: Medicare PPO | Admitting: Podiatry

## 2018-03-15 ENCOUNTER — Encounter: Payer: Self-pay | Admitting: Podiatry

## 2018-03-15 DIAGNOSIS — M79675 Pain in left toe(s): Secondary | ICD-10-CM | POA: Diagnosis not present

## 2018-03-15 DIAGNOSIS — B351 Tinea unguium: Secondary | ICD-10-CM

## 2018-03-15 DIAGNOSIS — M79674 Pain in right toe(s): Secondary | ICD-10-CM | POA: Diagnosis not present

## 2018-03-15 DIAGNOSIS — E1142 Type 2 diabetes mellitus with diabetic polyneuropathy: Secondary | ICD-10-CM

## 2018-03-15 NOTE — Progress Notes (Signed)
This patient  presents to the office with chief complaint of long thick painful nails.  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  He says his nails are painful due to the long thick nails on his remaining toes. He is unable to self treat.      He presents the office today for an evaluation and treatment of his nails.  General Appearance  Alert, conversant and in no acute stress.  Vascular  Dorsalis pedis and posterior tibial  pulses are weakly  palpable  bilaterally.  Capillary return is within normal limits  bilaterally. Temperature is within normal limits  Bilaterally. Swelling noted  B/L.  Neurologic  Senn-Weinstein monofilament wire test absent   bilaterally. Muscle power within normal limits bilaterally.  Nails Thick disfigured discolored nails with subungual debris  from 3,4,5 left and 2-5 right.. No evidence of bacterial infection or drainage bilaterally.  Orthopedic  No limitations of motion  feet .  No crepitus or effusions noted.  No bony pathology or digital deformities noted. Amputation noted hallux  B/Land second toe left foot.  Skin  normotropic skin with no porokeratosis noted bilaterally.  An open wound/ulcer sub first metatarsal left foot.  No redness or swelling or drainage noted.    Onychomycosis  B/L  Diabetes with neuropathy  Amputation  B/L  IE  Debride nails  X 7.    RTC 10 weeks  for nail care.  Tayshon Winker DPM 

## 2018-04-05 ENCOUNTER — Ambulatory Visit: Payer: Medicare PPO | Admitting: Podiatry

## 2018-04-21 ENCOUNTER — Other Ambulatory Visit: Payer: Self-pay

## 2018-04-21 ENCOUNTER — Encounter: Payer: Self-pay | Admitting: Emergency Medicine

## 2018-04-21 ENCOUNTER — Emergency Department
Admission: EM | Admit: 2018-04-21 | Discharge: 2018-04-21 | Disposition: A | Discharge status: Home / Self Care | Payer: Medicare PPO | Attending: Emergency Medicine | Admitting: Emergency Medicine

## 2018-04-21 DIAGNOSIS — E785 Hyperlipidemia, unspecified: Secondary | ICD-10-CM | POA: Diagnosis not present

## 2018-04-21 DIAGNOSIS — Y9289 Other specified places as the place of occurrence of the external cause: Secondary | ICD-10-CM | POA: Diagnosis not present

## 2018-04-21 DIAGNOSIS — S91105A Unspecified open wound of left lesser toe(s) without damage to nail, initial encounter: Secondary | ICD-10-CM

## 2018-04-21 DIAGNOSIS — E119 Type 2 diabetes mellitus without complications: Secondary | ICD-10-CM | POA: Insufficient documentation

## 2018-04-21 DIAGNOSIS — X58XXXA Exposure to other specified factors, initial encounter: Secondary | ICD-10-CM | POA: Insufficient documentation

## 2018-04-21 DIAGNOSIS — Z79899 Other long term (current) drug therapy: Secondary | ICD-10-CM | POA: Diagnosis not present

## 2018-04-21 DIAGNOSIS — Y9389 Activity, other specified: Secondary | ICD-10-CM | POA: Diagnosis not present

## 2018-04-21 DIAGNOSIS — Y998 Other external cause status: Secondary | ICD-10-CM | POA: Insufficient documentation

## 2018-04-21 DIAGNOSIS — I110 Hypertensive heart disease with heart failure: Secondary | ICD-10-CM | POA: Diagnosis not present

## 2018-04-21 DIAGNOSIS — S91302A Unspecified open wound, left foot, initial encounter: Secondary | ICD-10-CM | POA: Insufficient documentation

## 2018-04-21 DIAGNOSIS — I509 Heart failure, unspecified: Secondary | ICD-10-CM | POA: Diagnosis not present

## 2018-04-21 DIAGNOSIS — S91109A Unspecified open wound of unspecified toe(s) without damage to nail, initial encounter: Secondary | ICD-10-CM

## 2018-04-21 NOTE — Discharge Instructions (Signed)
Keep the wound of the toes clean, dry, and covered. Follow-up with your primary provider or podiatrist for ongoing care. Return to the ED as needed.

## 2018-04-21 NOTE — ED Triage Notes (Signed)
Pt to ed with c/o toe pain and bleeding on left foot.  Pt states bleeding is coming from in between 3rd and 4th toe.

## 2018-04-21 NOTE — ED Provider Notes (Signed)
[facility] Emergency Department Provider Note ____________________________________________  Time seen: 1635  I have reviewed the triage vital signs and the nursing notes.  HISTORY  Chief Complaint  Toe Pain  HPI Raymond [name] is a 53 y.o. male presents to the ED accompanied by his wife, for evaluation of wounds to the left foot under the remaining 3 toes.  Patient with history of CHF, cellulitis, diabetes, and multiple toe amputation, presents for spontaneous bleeding to the left foot.  He denies any trauma or injury to the foot.  He presents now with neuropathy related to his diabetes, but notes bleeding to the area under the crease of the toes.  He is without fevers, chills, or sweats.  Past Medical History:  Diagnosis Date  . Arthritis    hands  . Cellulitis   . CHF (congestive heart failure) ([place])   . Chronic diastolic heart failure ([place])   . Chronic respiratory failure with hypoxia (HCC)    2 L [place] continuously  . Diabetes mellitus type 2, insulin dependent ([place])   . Edema    FEET/LEGS  . GERD (gastroesophageal reflux disease)   . Hepatic steatosis   . HOH (hard of hearing)   . Hypertension   . Morbid obesity ([place])   . Morbid obesity with BMI of 45.0-49.9, adult ([place])   . Neuropathy   . Orthopnea   . OSA on CPAP    TRILOGY VENTILATOR  . Oxygen deficiency    2L  HS  . PVD (peripheral vascular disease) ([place])   . Shortness of breath dyspnea   . Systolic heart failure (HCC)    Preserved EF 50-55%    Patient Active Problem List   Diagnosis Date Noted  . Acute on chronic respiratory failure with hypoxia ([place]) 04/28/2017  . Acute kidney injury ([place]) 04/28/2017  . Hypotension 04/28/2017  . PVD (peripheral vascular disease) ([place]) 04/28/2017  . Hypertension 04/28/2017  . Diabetes mellitus type 2, insulin dependent ([place]) 04/28/2017  . OSA on CPAP 04/28/2017  . Chronic diastolic heart failure, NYHA class 1 ([place]) 04/28/2017  . Chest  pain with moderate risk for cardiac etiology 04/28/2017  . Morbid obesity with BMI of 45.0-49.9, adult ([place]) 04/28/2017  . Lymphedema 02/11/2017  . HTN (hypertension) 02/01/2017  . Diabetes ([place]) 02/01/2017  . Pneumonia 01/13/2017  . Cellulitis of right foot 09/30/2014  . COPD, moderate ([place]) 07/07/2014  . Chronic diastolic heart failure ([place]) 05/06/2014  . OSA treated with Trilogy machine 04/28/2014  . Hypoxemia requiring supplemental oxygen 04/28/2014  . Morbid obesity ([place]) 04/28/2014  . Hyperlipidemia 07/09/2013    Past Surgical History:  Procedure Laterality Date  . AMPUTATION Right 10/02/2014   Procedure: AMPUTATION RAY;  Surgeon: Albertine Patricia, DPM;  Location: [facility] ORS;  Service: Podiatry;  Laterality: Right;  . CATARACT EXTRACTION W/PHACO Right 09/10/2015   Procedure: CATARACT EXTRACTION PHACO AND INTRAOCULAR LENS PLACEMENT (IOC);  Surgeon: Birder Robson, MD;  Location: [facility] ORS;  Service: Ophthalmology;  Laterality: Right;  [place] 01:34 AP% 19.3 CDE 18.36 Fluid pack lot # [id number]  . CATARACT EXTRACTION W/PHACO Left 10/06/2015   Procedure: CATARACT EXTRACTION PHACO AND INTRAOCULAR LENS PLACEMENT (IOC);  Surgeon: Birder Robson, MD;  Location: [facility] ORS;  Service: Ophthalmology;  Laterality: Left;  [place] 00:56 AP% 19.5 CDE 11.02 Fluid Pack # [id number]  . CHOLECYSTECTOMY    . COLONOSCOPY WITH PROPOFOL N/A 05/17/2016   Procedure: COLONOSCOPY WITH PROPOFOL;  Surgeon: Lollie Sails, MD;  Location: [facility] ENDOSCOPY;  Service: Endoscopy;  Laterality: N/A;  . EYE SURGERY Left    bilat laser  . HIP SURGERY Right    8 th grade pins  . NOSE SURGERY    . PERIPHERAL VASCULAR CATHETERIZATION Right 10/02/2014   Procedure: Lower Extremity Angiography;  Surgeon: Algernon Huxley, MD;  Location: [facility] CV LAB;  Service: Cardiovascular;  Laterality: Right;  . TOE AMPUTATION Left 2010   2cd  . TOE AMPUTATION Right 2009    Prior to Admission medications   Medication Sig Start Date End Date  Taking? Authorizing Provider  ACCU-CHEK SMARTVIEW test strip  02/06/18   [provider]  amLODipine (NORVASC) 10 MG tablet amlodipine 10 mg tablet    [provider]  aspirin EC 81 MG tablet Take 81 mg by mouth daily.    [provider]  atorvastatin (LIPITOR) 40 MG tablet atorvastatin 40 mg tablet    [provider]  carvedilol (COREG) 6.25 MG tablet Take 1 tablet (6.25 mg total) by mouth 2 (two) times daily. 04/30/17 04/30/18  Rai, Vernelle Emerald, MD  cyclobenzaprine (FLEXERIL) 5 MG tablet Take 1 tablet (5 mg total) by mouth 3 (three) times daily as needed for muscle spasms. 03/11/18   Harvest Dark, MD  Dulaglutide (TRULICITY) 8.46 NG/2.9BM SOPN Inject into the skin.    [provider]  gabapentin (NEURONTIN) 300 MG capsule Take 300 mg by mouth 3 (three) times daily.    [provider]  insulin detemir (LEVEMIR) 100 UNIT/ML injection Inject 89 Units into the skin 2 (two) times daily.     [provider]  insulin lispro (HUMALOG) 100 UNIT/ML cartridge Inject 60 Units into the skin 3 (three) times daily with meals.    [provider]  losartan (COZAAR) 50 MG tablet Take 50 mg by mouth daily.    [provider]  metFORMIN (GLUCOPHAGE) 1000 MG tablet Take 1,000 mg by mouth 2 (two) times daily with a meal.    [provider]  Multiple Vitamins-Minerals (CENTRUM SILVER 50+MEN) TABS Take 1 tablet by mouth daily.    [provider]  omega-3 acid ethyl esters (LOVAZA) 1 g capsule Take 1 g by mouth daily.    [provider]  oxyCODONE-acetaminophen (PERCOCET) 5-325 MG tablet Take 1 tablet by mouth every 4 (four) hours as needed for severe pain. 03/11/18   Harvest Dark, MD  pantoprazole (PROTONIX) 40 MG tablet Take 40 mg by mouth daily.    [provider]  torsemide (DEMADEX) 20 MG tablet Take 2 tablets (40 mg total) by mouth daily. 05/01/17   Rai, Vernelle Emerald, MD  TRUEPLUS INSULIN SYRINGE 29G  X 1/2" 1 ML MISC  02/16/18   [provider]  TRUEPLUS PEN NEEDLES 31G X 8 MM Burns  02/06/18   [provider]    Allergies Patient has no known allergies.  Family History  Problem Relation Age of Onset  . Hypertension Mother   . Diabetes Mother   . Stroke Father   . Hypertension Father   . Heart attack Father     Social History Social History   Tobacco Use  . Smoking status: Never Smoker  . Smokeless tobacco: Never Used  Substance Use Topics  . Alcohol use: No    Alcohol/week: 0.0 standard drinks  . Drug use: No    Review of Systems  Constitutional: Negative for fever. Cardiovascular: Negative for chest pain. Respiratory: Negative for shortness of breath. Musculoskeletal: Negative for back pain. Skin: Negative for rash. Bleeding from  the left foot Neurological: Negative for headaches, focal weakness or numbness. ____________________________________________  PHYSICAL EXAM:  VITAL SIGNS: ED Triage Vitals  Enc Vitals Group     BP 04/21/18 1552 (!) 162/70     Pulse Rate 04/21/18 1552 77     Resp 04/21/18 1552 18     Temp 04/21/18 1552 98.1 F (36.7 C)     Temp Source 04/21/18 1552 Oral     SpO2 04/21/18 1552 100 %     Weight 04/21/18 1553 (!) 373 lb 14.4 oz (169.6 kg)     Height --      Head Circumference --      Peak Flow --      Pain Score 04/21/18 1552 0     Pain Loc --      Pain Edu? --      Excl. in [place]? --     Constitutional: Alert and oriented. Well appearing and in no distress. Head: Normocephalic and atraumatic. Eyes: Conjunctivae are normal. Normal extraocular movements Cardiovascular: Normal rate, regular rhythm. Normal distal pulses. Respiratory: Normal respiratory effort. No wheezes/rales/rhonchi. Musculoskeletal: Patient without any obvious deformity or dislocation except for the previously amputated first and second toes of the left foot.  Patient is noted to have lacerations to the plantar surface of the third and fourth  toes.  Nontender with normal range of motion in all extremities.  Neurologic:  Normal speech and language. No gross focal neurologic deficits are appreciated. Skin:  Skin is warm, dry and intact. No rash noted. ____________________________________________  PROCEDURES  Procedures  Wound care and dressing applied. -Telfa - Tube gauze ____________________________________________  INITIAL IMPRESSION / ASSESSMENT AND PLAN / ED COURSE  Patient with a history of amputation to the toe secondary to PVD resents with wounds to the plantar surface of the left foot at the remaining toes.  No signs of local infection.  Patient's wounds are cleansed and dressed with nonstick dressing at this time.  Patient discharged with wound care instructions and supplies.  Follow-up with primary provider or podiatrist for further wound care. ____________________________________________  FINAL CLINICAL IMPRESSION(S) / ED DIAGNOSES  Final diagnoses:  Open wound of toe of left foot      Carmie End, Dannielle Karvonen, PA-C 04/21/18 1722    Earleen Newport, MD 04/21/18 1757

## 2018-04-21 NOTE — ED Notes (Signed)
Pt said ulcer between 3rd ad 4th toe started bleeding today. Pt has hx of diabetic foot ulcers. Pt had wound wrapped in gauze and taped with boot on upon arrival. This RN looked at foot and returned bandage. Ulcer not actively bleeding, but dried blood and open wound present between 3rd and 4th toe. Big toe and second toe have been amputated.

## 2018-05-24 ENCOUNTER — Ambulatory Visit: Payer: Medicare PPO | Admitting: Podiatry

## 2018-06-07 ENCOUNTER — Other Ambulatory Visit: Payer: Self-pay

## 2018-06-07 ENCOUNTER — Ambulatory Visit (INDEPENDENT_AMBULATORY_CARE_PROVIDER_SITE_OTHER): Payer: Medicare PPO | Admitting: Podiatry

## 2018-06-07 ENCOUNTER — Encounter: Payer: Self-pay | Admitting: Podiatry

## 2018-06-07 VITALS — Temp 98.2°F

## 2018-06-07 DIAGNOSIS — Z89411 Acquired absence of right great toe: Secondary | ICD-10-CM

## 2018-06-07 DIAGNOSIS — B351 Tinea unguium: Secondary | ICD-10-CM

## 2018-06-07 DIAGNOSIS — M79674 Pain in right toe(s): Secondary | ICD-10-CM | POA: Diagnosis not present

## 2018-06-07 DIAGNOSIS — Z89421 Acquired absence of other right toe(s): Secondary | ICD-10-CM

## 2018-06-07 DIAGNOSIS — E1142 Type 2 diabetes mellitus with diabetic polyneuropathy: Secondary | ICD-10-CM | POA: Diagnosis not present

## 2018-06-07 DIAGNOSIS — M79675 Pain in left toe(s): Secondary | ICD-10-CM | POA: Diagnosis not present

## 2018-06-07 NOTE — Progress Notes (Signed)
This patient  presents to the office with chief complaint of long thick painful nails.  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  He says his nails are painful due to the long thick nails on his remaining toes. He is unable to self treat.      He presents the office today for an evaluation and treatment of his nails.  General Appearance  Alert, conversant and in no acute stress.  Vascular  Dorsalis pedis and posterior tibial  pulses are weakly  palpable  bilaterally.  Capillary return is within normal limits  bilaterally. Temperature is within normal limits  Bilaterally. Swelling noted  B/L.  Neurologic  Senn-Weinstein monofilament wire test absent   bilaterally. Muscle power within normal limits bilaterally.  Nails Thick disfigured discolored nails with subungual debris  from 3,4,5 left and 2-5 right.. No evidence of bacterial infection or drainage bilaterally.  Orthopedic  No limitations of motion  feet .  No crepitus or effusions noted.  No bony pathology or digital deformities noted. Amputation noted hallux  B/Land second toe left foot.  Skin  normotropic skin with no porokeratosis noted bilaterally.  An open wound/ulcer sub first metatarsal left foot.  No redness or swelling or drainage noted.    Onychomycosis  B/L  Diabetes with neuropathy  Amputation  B/L  IE  Debride nails  X 7.    RTC 10 weeks  for nail care.  Gardiner Barefoot DPM

## 2018-08-16 ENCOUNTER — Other Ambulatory Visit: Payer: Self-pay

## 2018-08-16 ENCOUNTER — Ambulatory Visit (INDEPENDENT_AMBULATORY_CARE_PROVIDER_SITE_OTHER): Payer: Medicare PPO | Admitting: Podiatry

## 2018-08-16 ENCOUNTER — Encounter: Payer: Self-pay | Admitting: Podiatry

## 2018-08-16 VITALS — Temp 97.6°F

## 2018-08-16 DIAGNOSIS — B351 Tinea unguium: Secondary | ICD-10-CM | POA: Diagnosis not present

## 2018-08-16 DIAGNOSIS — M79675 Pain in left toe(s): Secondary | ICD-10-CM

## 2018-08-16 DIAGNOSIS — E1142 Type 2 diabetes mellitus with diabetic polyneuropathy: Secondary | ICD-10-CM | POA: Diagnosis not present

## 2018-08-16 DIAGNOSIS — Z89421 Acquired absence of other right toe(s): Secondary | ICD-10-CM

## 2018-08-16 DIAGNOSIS — M79674 Pain in right toe(s): Secondary | ICD-10-CM

## 2018-08-16 NOTE — Progress Notes (Signed)
This patient  presents to the office with chief complaint of long thick painful nails.  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  He says his nails are painful due to the long thick nails on his remaining toes. He is unable to self treat.      He presents the office today for an evaluation and treatment of his nails.  General Appearance  Alert, conversant and in no acute stress.  Vascular  Dorsalis pedis and posterior tibial  pulses are weakly  palpable  bilaterally.  Capillary return is within normal limits  bilaterally. Temperature is within normal limits  Bilaterally. Swelling noted  B/L.  Neurologic  Senn-Weinstein monofilament wire test absent   bilaterally. Muscle power within normal limits bilaterally.  Nails Thick disfigured discolored nails with subungual debris  from 3,4,5 left and 2-5 right.. No evidence of bacterial infection or drainage bilaterally.  Orthopedic  No limitations of motion  feet .  No crepitus or effusions noted.  No bony pathology or digital deformities noted. Amputation noted hallux  B/Land second toe left foot.  Skin  normotropic skin with no porokeratosis noted bilaterally.    No redness or swelling or drainage noted. Callus left hallux.   Onychomycosis  B/L  Diabetes with neuropathy  Amputation  B/L  IE  Debride nails  X 7.    RTC 10 weeks  for nail care.  Gardiner Barefoot DPM

## 2018-11-15 ENCOUNTER — Ambulatory Visit (INDEPENDENT_AMBULATORY_CARE_PROVIDER_SITE_OTHER): Payer: Medicare PPO | Admitting: Podiatry

## 2018-11-15 ENCOUNTER — Other Ambulatory Visit: Payer: Self-pay

## 2018-11-15 ENCOUNTER — Encounter: Payer: Self-pay | Admitting: Podiatry

## 2018-11-15 DIAGNOSIS — B351 Tinea unguium: Secondary | ICD-10-CM | POA: Diagnosis not present

## 2018-11-15 DIAGNOSIS — E1142 Type 2 diabetes mellitus with diabetic polyneuropathy: Secondary | ICD-10-CM | POA: Diagnosis not present

## 2018-11-15 DIAGNOSIS — Z89421 Acquired absence of other right toe(s): Secondary | ICD-10-CM

## 2018-11-15 DIAGNOSIS — M79675 Pain in left toe(s): Secondary | ICD-10-CM

## 2018-11-15 DIAGNOSIS — M79674 Pain in right toe(s): Secondary | ICD-10-CM

## 2018-11-15 NOTE — Progress Notes (Signed)
This patient  presents to the office with chief complaint of long thick painful nails.  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  He says his nails are painful due to the long thick nails on his remaining toes. He is unable to self treat.      He presents the office today for an evaluation and treatment of his nails.  General Appearance  Alert, conversant and in no acute stress.  Vascular  Dorsalis pedis and posterior tibial  pulses are weakly  palpable  bilaterally.  Capillary return is within normal limits  bilaterally. Temperature is within normal limits  Bilaterally. Swelling noted  B/L.  Neurologic  Senn-Weinstein monofilament wire test absent   bilaterally. Muscle power within normal limits bilaterally.  Nails Thick disfigured discolored nails with subungual debris  from 3,4,5 left and 2-5 right.. No evidence of bacterial infection or drainage bilaterally.  Orthopedic  No limitations of motion  feet .  No crepitus or effusions noted.  No bony pathology or digital deformities noted. Amputation noted hallux  B/Land second toe left foot.  Skin  normotropic skin with no porokeratosis noted bilaterally.    No redness or swelling or drainage noted. Callus left hallux.   Onychomycosis  B/L  Diabetes with neuropathy  Amputation  B/L  IE  Debride nails  X 7.    RTC 10 weeks  for nail care.  Obryan Radu DPM 

## 2018-12-26 IMAGING — DX DG CHEST 1V PORT
1 series · 1 of 1 positions shown · non-contrast
Comparison: CT 01/16/2017.  Radiographs 01/13/2017.

CLINICAL DATA: Chest pain for 1 week with shortness of breath.
History of congestive heart failure and chronic respiratory failure.

EXAM:
PORTABLE CHEST 1 VIEW

[chest]
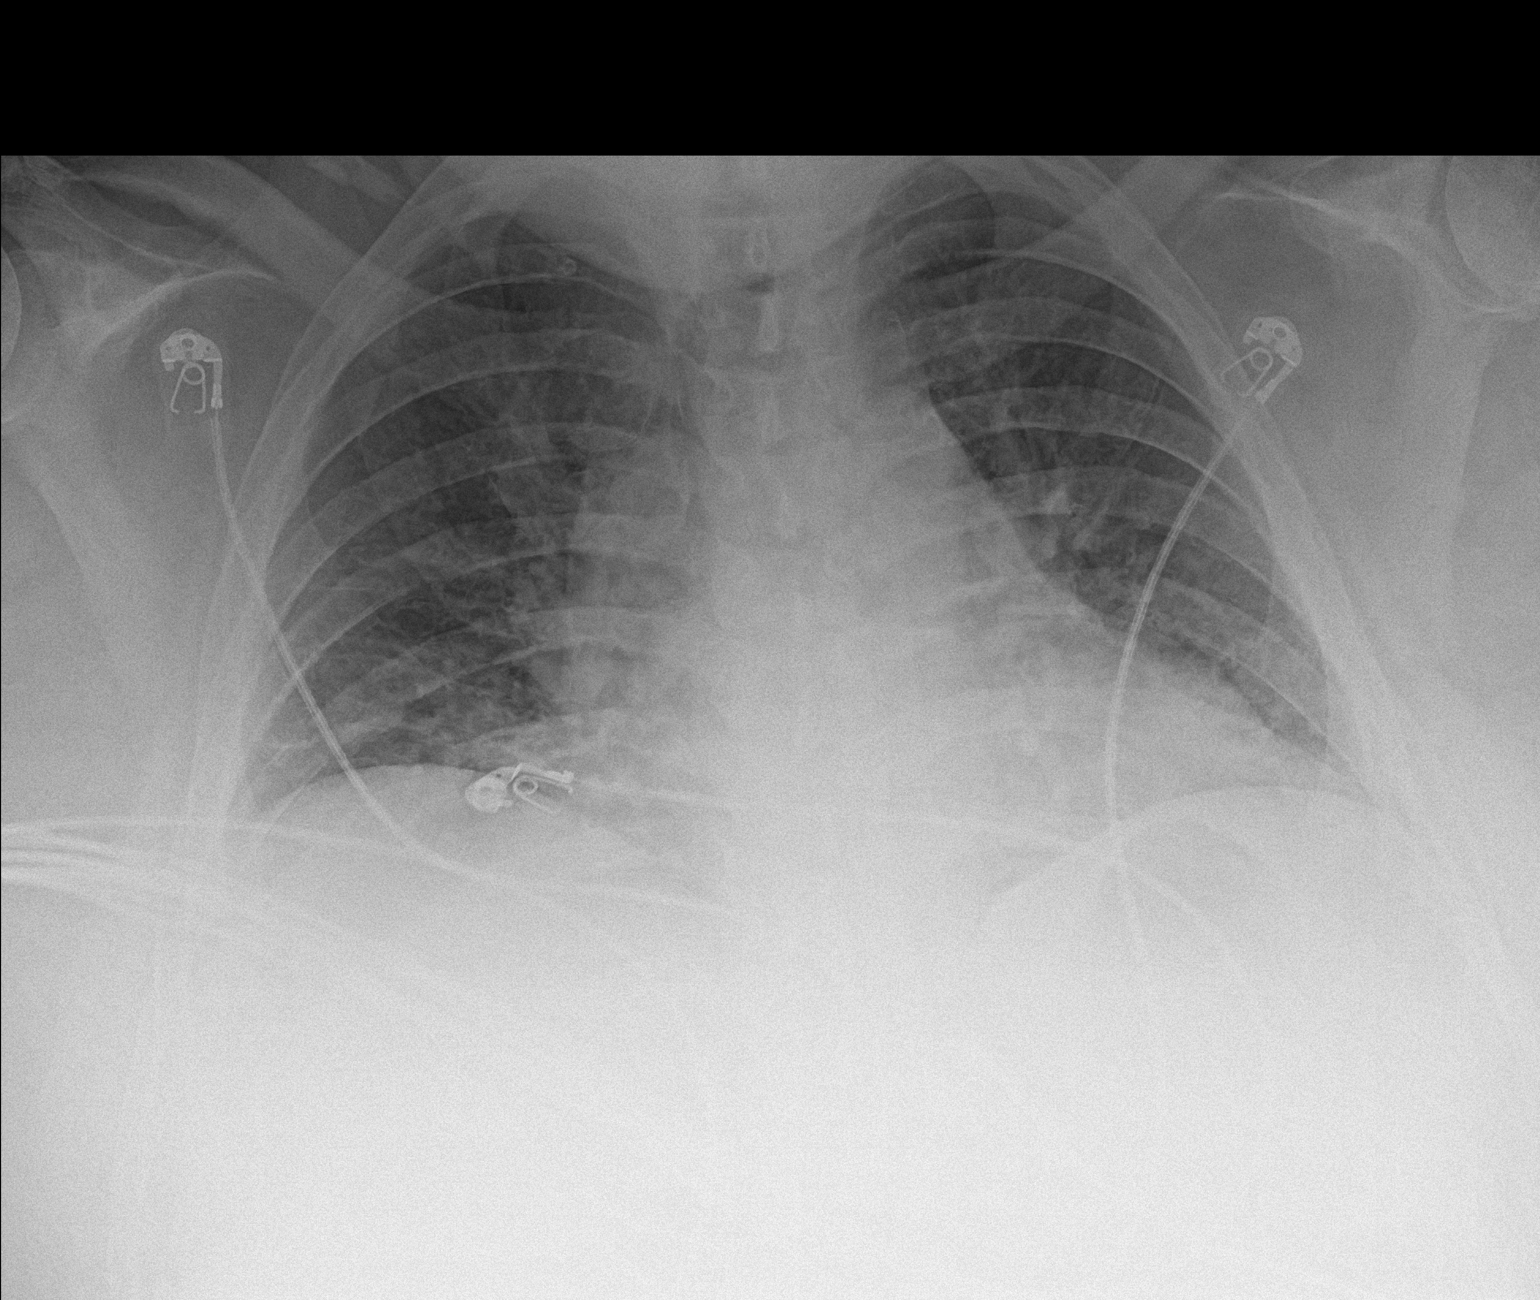

[1 of 1 positions shown; findings below may reference images not displayed]

FINDINGS: 6368 hours. Low lung volumes. The heart size and mediastinal
contours are stable. The previously demonstrated bibasilar airspace
opacities have partially cleared. There is vascular congestion and
possible mild edema. No pneumothorax or significant pleural
effusion. The bones appear unremarkable. Telemetry leads overlie the
chest.
IMPRESSION: Compared with the studies of 3 months ago, the bibasilar airspace
opacities have partially cleared but not completely. Possible mild
pulmonary edema. Suggest two view exam when the patient is capable.

## 2018-12-27 IMAGING — NM NM PULMONARY VENT & PERF
15 series · 15 of 15 positions shown · non-contrast
Comparison: Chest x-ray from yesterday.  Chest CT 01/16/2017

CLINICAL DATA: Suspected pulmonary embolism.  Positive D-dimer.

EXAM:
NUCLEAR MEDICINE VENTILATION - PERFUSION LUNG SCAN
TECHNIQUE: Ventilation images were obtained in multiple projections using
inhaled aerosol 2c-NNm DTPA. Perfusion images were obtained in
multiple projections after intravenous injection of [id number].
RADIOPHARMACEUTICALS:  32.4 mCi of 2c-NNm DTPA aerosol inhalation
and 4.35 mCi [id number] IV

[Series 1: ant/post vent · 4.14mm/px · 1 of 1 slices shown (1 of 2)]
[im 1/1]
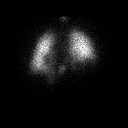

[Series 1: ant/post vent · 4.14mm/px · 1 of 1 slices shown (2 of 2)]
[im 1/1]
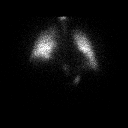

[Series 2: lao/rpo vent · 4.14mm/px · 1 of 1 slices shown (1 of 2)]
[im 1/1]
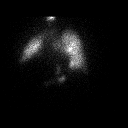

[Series 2: lao/rpo vent · 4.14mm/px · 1 of 1 slices shown (2 of 2)]
[im 1/1]
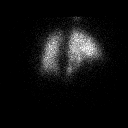

[Series 3: lpo/rao vent · 4.14mm/px · 1 of 1 slices shown (1 of 2)]
[im 1/1]
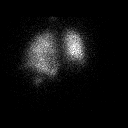

[Series 3: lpo/rao vent · 4.14mm/px · 1 of 1 slices shown (2 of 2)]
[im 1/1]
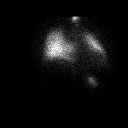

[Series 4: lt lat/rt lat vent · 4.14mm/px · 1 of 1 slices shown (1 of 2)]
[im 1/1  full-range]
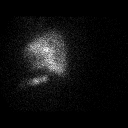

[Series 4: lt lat/rt lat vent · 4.14mm/px · 1 of 1 slices shown (2 of 2)]
[im 1/1  full-range]
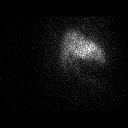

[Series 5: lt lat/rt lat perf · 4.14mm/px · 1 of 1 slices shown (1 of 2)]
[im 1/1]
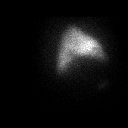

[Series 5: lt lat/rt lat perf · 4.14mm/px · 1 of 1 slices shown (2 of 2)]
[im 1/1]
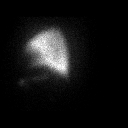

[Series 6: lpo/rao perf · 4.14mm/px · 1 of 1 slices shown (1 of 2)]
[im 1/1]
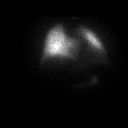

[Series 6: lpo/rao perf · 4.14mm/px · 1 of 1 slices shown (2 of 2)]
[im 1/1]
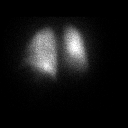

[Series 7: ant/post perf · 4.14mm/px · 1 of 1 slices shown]
[im 1/1]
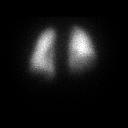

[Series 8: lao/rpo perf · 4.14mm/px · 1 of 1 slices shown (1 of 2)]
[im 1/1]
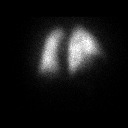

[Series 8: lao/rpo perf · 4.14mm/px · 1 of 1 slices shown (2 of 2)]
[im 1/1]
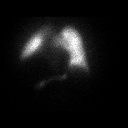

[15 of 15 positions shown; findings below may reference images not displayed]

FINDINGS: Ventilation: No focal ventilation defect.

Perfusion: No wedge shaped peripheral perfusion defects to suggest
acute pulmonary embolism.
IMPRESSION: Negative for pulmonary embolism.  No perfusion defects.

## 2019-01-21 ENCOUNTER — Other Ambulatory Visit: Payer: Self-pay

## 2019-01-21 ENCOUNTER — Encounter: Payer: Self-pay | Admitting: Podiatry

## 2019-01-21 ENCOUNTER — Ambulatory Visit: Payer: Medicare PPO | Admitting: Podiatry

## 2019-01-21 DIAGNOSIS — M79675 Pain in left toe(s): Secondary | ICD-10-CM

## 2019-01-21 DIAGNOSIS — S98112S Complete traumatic amputation of left great toe, sequela: Secondary | ICD-10-CM

## 2019-01-21 DIAGNOSIS — M79674 Pain in right toe(s): Secondary | ICD-10-CM | POA: Diagnosis not present

## 2019-01-21 DIAGNOSIS — E1142 Type 2 diabetes mellitus with diabetic polyneuropathy: Secondary | ICD-10-CM | POA: Diagnosis not present

## 2019-01-21 DIAGNOSIS — B351 Tinea unguium: Secondary | ICD-10-CM

## 2019-01-21 DIAGNOSIS — Z89411 Acquired absence of right great toe: Secondary | ICD-10-CM

## 2019-01-21 NOTE — Progress Notes (Signed)
This patient  presents to the office with chief complaint of long thick painful nails.  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  He says his nails are painful due to the long thick nails on his remaining toes. He is unable to self treat.      He presents the office today for an evaluation and treatment of his nails.  General Appearance  Alert, conversant and in no acute stress.  Vascular  Dorsalis pedis and posterior tibial  pulses are weakly  palpable  bilaterally.  Capillary return is within normal limits  bilaterally. Temperature is within normal limits  Bilaterally. Swelling noted  B/L.  Neurologic  Senn-Weinstein monofilament wire test absent   bilaterally. Muscle power within normal limits bilaterally.  Nails Thick disfigured discolored nails with subungual debris  from 3,4,5 left and 2-5 right.. No evidence of bacterial infection or drainage bilaterally.  Orthopedic  No limitations of motion  feet .  No crepitus or effusions noted.  No bony pathology or digital deformities noted. Amputation noted hallux  B/Land second toe left foot.  Skin  normotropic skin with no porokeratosis noted bilaterally.    No redness or swelling or drainage noted. Callus left hallux.   Onychomycosis  B/L  Diabetes with neuropathy  Amputation  B/L  IE  Debride nails  X 7.    RTC 10 weeks  for nail care.  Gardiner Barefoot DPM

## 2019-04-01 ENCOUNTER — Ambulatory Visit: Payer: Medicare PPO | Admitting: Podiatry

## 2019-06-11 ENCOUNTER — Ambulatory Visit: Payer: Medicare HMO | Attending: Internal Medicine

## 2019-06-11 DIAGNOSIS — Z23 Encounter for immunization: Secondary | ICD-10-CM

## 2019-06-11 NOTE — Progress Notes (Signed)
   Covid-19 Vaccination Clinic  Name:  Raymond [name]    MRN: [id number] DOB: 1965/10/31  06/11/2019  Raymond [name] was observed post Covid-19 immunization for 15 minutes without incident. He was provided with Vaccine Information Sheet and instruction to access the V-Safe system.   Raymond [name] was instructed to call 911 with any severe reactions post vaccine: Marland Kitchen Difficulty breathing  . Swelling of face and throat  . A fast heartbeat  . A bad rash all over body  . Dizziness and weakness   Immunizations Administered    Name Date Dose VIS Date Route   Pfizer COVID-19 Vaccine 06/11/2019 10:18 AM 0.3 mL 03/27/2018 Intramuscular   Manufacturer: [place]   Lot: [id number]   [place]: [id number]

## 2019-07-02 ENCOUNTER — Ambulatory Visit: Payer: Medicare HMO | Attending: Internal Medicine

## 2019-07-02 DIAGNOSIS — Z23 Encounter for immunization: Secondary | ICD-10-CM

## 2019-07-02 NOTE — Progress Notes (Signed)
   Covid-19 Vaccination Clinic  Name:  Raymond [name] [facility]    MRN: [id number] DOB: 03-25-1965  07/02/2019  [name] was observed post Covid-19 immunization for 15 minutes without incident. He was provided with Vaccine Information Sheet and instruction to access the V-Safe system.   [name] was instructed to call 911 with any severe reactions post vaccine: Marland Kitchen Difficulty breathing  . Swelling of face and throat  . A fast heartbeat  . A bad rash all over body  . Dizziness and weakness   Immunizations Administered    Name Date Dose VIS Date Route   Pfizer COVID-19 Vaccine 07/02/2019 10:09 AM 0.3 mL 03/27/2018 Intramuscular   Manufacturer: [place]   Lot: [id number]   [place]: [id number]

## 2020-02-05 ENCOUNTER — Inpatient Hospital Stay
Admission: EM | Admit: 2020-02-05 | Discharge: 2020-02-23 | DRG: 177 | Disposition: A | Discharge status: Home Health | Payer: Medicare HMO | Attending: Internal Medicine | Admitting: Internal Medicine

## 2020-02-05 ENCOUNTER — Other Ambulatory Visit: Payer: Self-pay

## 2020-02-05 ENCOUNTER — Emergency Department: Payer: Medicare HMO

## 2020-02-05 DIAGNOSIS — K219 Gastro-esophageal reflux disease without esophagitis: Secondary | ICD-10-CM | POA: Diagnosis present

## 2020-02-05 DIAGNOSIS — I5043 Acute on chronic combined systolic (congestive) and diastolic (congestive) heart failure: Secondary | ICD-10-CM | POA: Diagnosis present

## 2020-02-05 DIAGNOSIS — G8929 Other chronic pain: Secondary | ICD-10-CM | POA: Diagnosis present

## 2020-02-05 DIAGNOSIS — E876 Hypokalemia: Secondary | ICD-10-CM | POA: Diagnosis present

## 2020-02-05 DIAGNOSIS — Z961 Presence of intraocular lens: Secondary | ICD-10-CM | POA: Diagnosis present

## 2020-02-05 DIAGNOSIS — H9190 Unspecified hearing loss, unspecified ear: Secondary | ICD-10-CM | POA: Diagnosis present

## 2020-02-05 DIAGNOSIS — I10 Essential (primary) hypertension: Secondary | ICD-10-CM | POA: Diagnosis present

## 2020-02-05 DIAGNOSIS — E662 Morbid (severe) obesity with alveolar hypoventilation: Secondary | ICD-10-CM | POA: Diagnosis present

## 2020-02-05 DIAGNOSIS — T502X5A Adverse effect of carbonic-anhydrase inhibitors, benzothiadiazides and other diuretics, initial encounter: Secondary | ICD-10-CM | POA: Diagnosis present

## 2020-02-05 DIAGNOSIS — E119 Type 2 diabetes mellitus without complications: Secondary | ICD-10-CM

## 2020-02-05 DIAGNOSIS — Z9841 Cataract extraction status, right eye: Secondary | ICD-10-CM | POA: Diagnosis not present

## 2020-02-05 DIAGNOSIS — E1151 Type 2 diabetes mellitus with diabetic peripheral angiopathy without gangrene: Secondary | ICD-10-CM | POA: Diagnosis present

## 2020-02-05 DIAGNOSIS — J9601 Acute respiratory failure with hypoxia: Secondary | ICD-10-CM | POA: Diagnosis not present

## 2020-02-05 DIAGNOSIS — G4733 Obstructive sleep apnea (adult) (pediatric): Secondary | ICD-10-CM | POA: Diagnosis not present

## 2020-02-05 DIAGNOSIS — Z89422 Acquired absence of other left toe(s): Secondary | ICD-10-CM

## 2020-02-05 DIAGNOSIS — Z6841 Body Mass Index (BMI) 40.0 and over, adult: Secondary | ICD-10-CM | POA: Diagnosis not present

## 2020-02-05 DIAGNOSIS — I5032 Chronic diastolic (congestive) heart failure: Secondary | ICD-10-CM | POA: Diagnosis not present

## 2020-02-05 DIAGNOSIS — Z9842 Cataract extraction status, left eye: Secondary | ICD-10-CM

## 2020-02-05 DIAGNOSIS — Z79899 Other long term (current) drug therapy: Secondary | ICD-10-CM

## 2020-02-05 DIAGNOSIS — Z7982 Long term (current) use of aspirin: Secondary | ICD-10-CM

## 2020-02-05 DIAGNOSIS — M19042 Primary osteoarthritis, left hand: Secondary | ICD-10-CM | POA: Diagnosis present

## 2020-02-05 DIAGNOSIS — Z8249 Family history of ischemic heart disease and other diseases of the circulatory system: Secondary | ICD-10-CM

## 2020-02-05 DIAGNOSIS — E1165 Type 2 diabetes mellitus with hyperglycemia: Secondary | ICD-10-CM | POA: Diagnosis present

## 2020-02-05 DIAGNOSIS — J9622 Acute and chronic respiratory failure with hypercapnia: Secondary | ICD-10-CM | POA: Diagnosis present

## 2020-02-05 DIAGNOSIS — E1140 Type 2 diabetes mellitus with diabetic neuropathy, unspecified: Secondary | ICD-10-CM | POA: Diagnosis present

## 2020-02-05 DIAGNOSIS — N182 Chronic kidney disease, stage 2 (mild): Secondary | ICD-10-CM | POA: Diagnosis present

## 2020-02-05 DIAGNOSIS — Z794 Long term (current) use of insulin: Secondary | ICD-10-CM | POA: Diagnosis not present

## 2020-02-05 DIAGNOSIS — R0902 Hypoxemia: Secondary | ICD-10-CM | POA: Diagnosis not present

## 2020-02-05 DIAGNOSIS — M19041 Primary osteoarthritis, right hand: Secondary | ICD-10-CM | POA: Diagnosis present

## 2020-02-05 DIAGNOSIS — E11620 Type 2 diabetes mellitus with diabetic dermatitis: Secondary | ICD-10-CM | POA: Diagnosis not present

## 2020-02-05 DIAGNOSIS — Z9049 Acquired absence of other specified parts of digestive tract: Secondary | ICD-10-CM

## 2020-02-05 DIAGNOSIS — R778 Other specified abnormalities of plasma proteins: Secondary | ICD-10-CM

## 2020-02-05 DIAGNOSIS — Z89421 Acquired absence of other right toe(s): Secondary | ICD-10-CM

## 2020-02-05 DIAGNOSIS — U071 COVID-19: Secondary | ICD-10-CM | POA: Diagnosis present

## 2020-02-05 DIAGNOSIS — J1282 Pneumonia due to coronavirus disease 2019: Secondary | ICD-10-CM | POA: Diagnosis present

## 2020-02-05 DIAGNOSIS — Z9981 Dependence on supplemental oxygen: Secondary | ICD-10-CM

## 2020-02-05 DIAGNOSIS — K760 Fatty (change of) liver, not elsewhere classified: Secondary | ICD-10-CM | POA: Diagnosis present

## 2020-02-05 DIAGNOSIS — R0602 Shortness of breath: Secondary | ICD-10-CM | POA: Diagnosis present

## 2020-02-05 DIAGNOSIS — I130 Hypertensive heart and chronic kidney disease with heart failure and stage 1 through stage 4 chronic kidney disease, or unspecified chronic kidney disease: Secondary | ICD-10-CM | POA: Diagnosis present

## 2020-02-05 DIAGNOSIS — J9621 Acute and chronic respiratory failure with hypoxia: Secondary | ICD-10-CM | POA: Diagnosis present

## 2020-02-05 DIAGNOSIS — Z833 Family history of diabetes mellitus: Secondary | ICD-10-CM

## 2020-02-05 DIAGNOSIS — E1122 Type 2 diabetes mellitus with diabetic chronic kidney disease: Secondary | ICD-10-CM | POA: Diagnosis present

## 2020-02-05 LAB — COMPREHENSIVE METABOLIC PANEL
ALT: 36 U/L (ref 0–44)
AST: 40 U/L (ref 15–41)
Albumin: 3.3 g/dL — ABNORMAL LOW (ref 3.5–5.0)
Alkaline Phosphatase: 57 U/L (ref 38–126)
Anion gap: 12 (ref 5–15)
BUN: 17 mg/dL (ref 6–20)
CO2: 27 mmol/L (ref 22–32)
Calcium: 8 mg/dL — ABNORMAL LOW (ref 8.9–10.3)
Chloride: 98 mmol/L (ref 98–111)
Creatinine, Ser: 1.32 mg/dL — ABNORMAL HIGH (ref 0.61–1.24)
GFR, Estimated: >60 mL/min (ref >60)
Glucose, Bld: 342 mg/dL — ABNORMAL HIGH (ref 70–99)
Potassium: 3.3 mmol/L — ABNORMAL LOW (ref 3.5–5.1)
Sodium: 137 mmol/L (ref 135–145)
Total Bilirubin: 1.1 mg/dL (ref 0.3–1.2)
Total Protein: 6.6 g/dL (ref 6.5–8.1)

## 2020-02-05 LAB — CBC WITH DIFFERENTIAL/PLATELET
Abs Immature Granulocytes: 0.03 10*3/uL (ref 0.00–0.07)
Basophils Absolute: 0 10*3/uL (ref 0.0–0.1)
Basophils Relative: 0 %
Eosinophils Absolute: 0 10*3/uL (ref 0.0–0.5)
Eosinophils Relative: 0 %
HCT: 32.7 % — ABNORMAL LOW (ref 39.0–52.0)
Hemoglobin: 10.2 g/dL — ABNORMAL LOW (ref 13.0–17.0)
Immature Granulocytes: 1 %
Lymphocytes Relative: 25 %
Lymphs Abs: 1.6 10*3/uL (ref 0.7–4.0)
MCH: 26 pg (ref 26.0–34.0)
MCHC: 31.2 g/dL (ref 30.0–36.0)
MCV: 83.4 fL (ref 80.0–100.0)
Monocytes Absolute: 1.2 10*3/uL — ABNORMAL HIGH (ref 0.1–1.0)
Monocytes Relative: 19 %
Neutro Abs: 3.5 10*3/uL (ref 1.7–7.7)
Neutrophils Relative %: 55 %
Platelets: 220 10*3/uL (ref 150–400)
RBC: 3.92 MIL/uL — ABNORMAL LOW (ref 4.22–5.81)
RDW: 15.1 % (ref 11.5–15.5)
WBC: 6.3 10*3/uL (ref 4.0–10.5)
nRBC: 0 % (ref 0.0–0.2)

## 2020-02-05 LAB — PROCALCITONIN: Procalcitonin: 0.15 ng/mL

## 2020-02-05 LAB — MAGNESIUM: Magnesium: 1.6 mg/dL — ABNORMAL LOW (ref 1.7–2.4)

## 2020-02-05 LAB — CBG MONITORING, ED
Glucose-Capillary: 392 mg/dL — ABNORMAL HIGH (ref 70–99)
Glucose-Capillary: 468 mg/dL — ABNORMAL HIGH (ref 70–99)

## 2020-02-05 LAB — HEMOGLOBIN A1C
Hgb A1c MFr Bld: 7.8 % — ABNORMAL HIGH (ref 4.8–5.6)
Mean Plasma Glucose: 177.16 mg/dL

## 2020-02-05 LAB — TROPONIN I (HIGH SENSITIVITY)
Troponin I (High Sensitivity): 36 ng/L — ABNORMAL HIGH (ref <18)
Troponin I (High Sensitivity): 32 ng/L — ABNORMAL HIGH (ref <18)

## 2020-02-05 LAB — BRAIN NATRIURETIC PEPTIDE: B Natriuretic Peptide: 84.3 pg/mL (ref 0.0–100.0)

## 2020-02-05 LAB — POC SARS CORONAVIRUS 2 AG -  ED: SARS Coronavirus 2 Ag: POSITIVE — AB

## 2020-02-05 MED ORDER — SODIUM CHLORIDE 0.9% FLUSH
3.0000 mL | Freq: Two times a day (BID) | INTRAVENOUS | Status: DC
  Administered 2020-02-05 – 2020-02-22 (×31): 3 mL via INTRAVENOUS

## 2020-02-05 MED ORDER — SODIUM CHLORIDE 0.9 % IV SOLN
170.0000 mg | Freq: Once | INTRAVENOUS | Status: AC
  Administered 2020-02-05: 170 mg via INTRAVENOUS
  Filled 2020-02-05: qty 1.36

## 2020-02-05 MED ORDER — INSULIN ASPART 100 UNIT/ML ~~LOC~~ SOLN
0.0000 [IU] | Freq: Three times a day (TID) | SUBCUTANEOUS | Status: DC
  Administered 2020-02-05 – 2020-02-06 (×3): 20 [IU] via SUBCUTANEOUS
  Administered 2020-02-07: 08:00:00 5 [IU] via SUBCUTANEOUS
  Administered 2020-02-07: 20 [IU] via SUBCUTANEOUS
  Filled 2020-02-05 (×6): qty 1

## 2020-02-05 MED ORDER — ACETAMINOPHEN 325 MG PO TABS: 650.0000 mg | ORAL_TABLET | Freq: Four times a day (QID) | ORAL | Status: DC | PRN

## 2020-02-05 MED ORDER — SODIUM CHLORIDE 0.9 % IV SOLN
250.0000 mL | INTRAVENOUS | Status: DC | PRN
  Administered 2020-02-11: 250 mL via INTRAVENOUS

## 2020-02-05 MED ORDER — ASPIRIN EC 81 MG PO TBEC
81.0000 mg | DELAYED_RELEASE_TABLET | Freq: Every day | ORAL | Status: DC
  Administered 2020-02-05 – 2020-02-22 (×18): 81 mg via ORAL
  Filled 2020-02-05 (×18): qty 1

## 2020-02-05 MED ORDER — INSULIN GLARGINE 100 UNIT/ML ~~LOC~~ SOLN
89.0000 [IU] | Freq: Two times a day (BID) | SUBCUTANEOUS | Status: DC
  Administered 2020-02-05: 89 [IU] via SUBCUTANEOUS
  Filled 2020-02-05 (×3): qty 0.89

## 2020-02-05 MED ORDER — ACETAMINOPHEN 325 MG PO TABS
650.0000 mg | ORAL_TABLET | Freq: Once | ORAL | Status: AC
  Administered 2020-02-05: 650 mg via ORAL
  Filled 2020-02-05: qty 2

## 2020-02-05 MED ORDER — SODIUM CHLORIDE 0.9 % IV SOLN
100.0000 mg | Freq: Every day | INTRAVENOUS | Status: AC
  Administered 2020-02-06 – 2020-02-09 (×4): 100 mg via INTRAVENOUS
  Filled 2020-02-05: qty 100
  Filled 2020-02-05 (×5): qty 20

## 2020-02-05 MED ORDER — LOSARTAN POTASSIUM 50 MG PO TABS
50.0000 mg | ORAL_TABLET | Freq: Every day | ORAL | Status: DC
  Administered 2020-02-05 – 2020-02-22 (×16): 50 mg via ORAL
  Filled 2020-02-05 (×17): qty 1

## 2020-02-05 MED ORDER — INSULIN DETEMIR 100 UNIT/ML ~~LOC~~ SOLN
89.0000 [IU] | Freq: Two times a day (BID) | SUBCUTANEOUS | Status: DC
  Filled 2020-02-05 (×2): qty 0.89

## 2020-02-05 MED ORDER — DEXAMETHASONE 6 MG PO TABS
6.0000 mg | ORAL_TABLET | ORAL | Status: DC
  Administered 2020-02-06: 6 mg via ORAL
  Filled 2020-02-05 (×2): qty 1

## 2020-02-05 MED ORDER — GUAIFENESIN-DM 100-10 MG/5ML PO SYRP: 10.0000 mL | ORAL_SOLUTION | ORAL | Status: DC | PRN

## 2020-02-05 MED ORDER — POTASSIUM CHLORIDE CRYS ER 20 MEQ PO TBCR
40.0000 meq | EXTENDED_RELEASE_TABLET | Freq: Every day | ORAL | Status: DC
  Administered 2020-02-05 – 2020-02-06 (×2): 40 meq via ORAL
  Filled 2020-02-05 (×2): qty 2

## 2020-02-05 MED ORDER — SODIUM CHLORIDE 0.9 % IV SOLN
200.0000 mg | Freq: Once | INTRAVENOUS | Status: AC
  Administered 2020-02-05: 200 mg via INTRAVENOUS
  Filled 2020-02-05: qty 200

## 2020-02-05 MED ORDER — TORSEMIDE 20 MG PO TABS
40.0000 mg | ORAL_TABLET | Freq: Every day | ORAL | Status: DC
  Administered 2020-02-05 – 2020-02-12 (×8): 40 mg via ORAL
  Filled 2020-02-05 (×10): qty 2

## 2020-02-05 MED ORDER — GABAPENTIN 300 MG PO CAPS
300.0000 mg | ORAL_CAPSULE | Freq: Three times a day (TID) | ORAL | Status: DC
  Administered 2020-02-05 – 2020-02-14 (×27): 300 mg via ORAL
  Filled 2020-02-05 (×27): qty 1

## 2020-02-05 MED ORDER — ENOXAPARIN SODIUM 100 MG/ML ~~LOC~~ SOLN
85.0000 mg | SUBCUTANEOUS | Status: DC
  Administered 2020-02-05 – 2020-02-09 (×5): 85 mg via SUBCUTANEOUS
  Filled 2020-02-05 (×7): qty 1

## 2020-02-05 MED ORDER — MAGNESIUM SULFATE 2 GM/50ML IV SOLN
2.0000 g | Freq: Once | INTRAVENOUS | Status: AC
  Administered 2020-02-05: 2 g via INTRAVENOUS
  Filled 2020-02-05: qty 50

## 2020-02-05 MED ORDER — HYDROCOD POLST-CPM POLST ER 10-8 MG/5ML PO SUER: 5.0000 mL | Freq: Two times a day (BID) | ORAL | Status: DC | PRN

## 2020-02-05 MED ORDER — SODIUM CHLORIDE 0.9% FLUSH: 3.0000 mL | INTRAVENOUS | Status: DC | PRN

## 2020-02-05 MED ORDER — INSULIN LISPRO 100 UNIT/ML CARTRIDGE: 50.0000 [IU] | Freq: Three times a day (TID) | SUBCUTANEOUS | Status: DC

## 2020-02-05 MED ORDER — INSULIN ASPART 100 UNIT/ML ~~LOC~~ SOLN
50.0000 [IU] | Freq: Three times a day (TID) | SUBCUTANEOUS | Status: DC
  Administered 2020-02-05: 50 [IU] via SUBCUTANEOUS
  Filled 2020-02-05: qty 1

## 2020-02-05 MED ORDER — PANTOPRAZOLE SODIUM 40 MG PO TBEC
40.0000 mg | DELAYED_RELEASE_TABLET | Freq: Every day | ORAL | Status: DC
  Administered 2020-02-05 – 2020-02-22 (×17): 40 mg via ORAL
  Filled 2020-02-05 (×17): qty 1

## 2020-02-05 MED ORDER — INSULIN ASPART 100 UNIT/ML ~~LOC~~ SOLN
0.0000 [IU] | Freq: Every day | SUBCUTANEOUS | Status: DC
  Administered 2020-02-05 – 2020-02-06 (×2): 5 [IU] via SUBCUTANEOUS
  Filled 2020-02-05 (×2): qty 1

## 2020-02-05 MED ORDER — AMLODIPINE BESYLATE 10 MG PO TABS
10.0000 mg | ORAL_TABLET | Freq: Every day | ORAL | Status: DC
  Administered 2020-02-05 – 2020-02-22 (×18): 10 mg via ORAL
  Filled 2020-02-05 (×4): qty 1
  Filled 2020-02-05: qty 2
  Filled 2020-02-05 (×12): qty 1
  Filled 2020-02-05: qty 2

## 2020-02-05 NOTE — H&P (Signed)
History and Physical    Raymond [name] [id number] DOB: [birth year] DOA: 02/05/2020  PCP: [name], NP  Patient coming from: home   Chief Complaint: cough, shortness of breath  HPI: Raymond [name] is a 55 y.o. male with medical history significant for morbid obesity, t2dm, osa on trilogy, chronic hypoxic respiratory failure on 2 L at home, [facility], who presents with the above.  Exposed to family member with covid 2 days ago. Yesterday developed cough and shortness of breath. Also subjective fever. No chest pain, no pleuritic pain. No n/v/d. No abdominal pain. No dysuria. Has had 2 vaccines but no booster. Required 3 L at home, up from baseline of 2.   ED Course:   Labs, cxr, steroids, remdesivir.  Review of Systems: As per HPI otherwise 10 point review of systems negative.    Past Medical History:  Diagnosis Date  . Arthritis    hands  . Cellulitis   . CHF (congestive heart failure) ([place])   . Chronic diastolic heart failure ([place])   . Chronic respiratory failure with hypoxia (HCC)    2 L  continuously  . Diabetes mellitus type 2, insulin dependent ([place])   . Edema    FEET/LEGS  . GERD (gastroesophageal reflux disease)   . Hepatic steatosis   . HOH (hard of hearing)   . Hypertension   . Morbid obesity ([place])   . Morbid obesity with BMI of 45.0-49.9, adult ([place])   . Neuropathy   . Orthopnea   . OSA on CPAP    TRILOGY VENTILATOR  . Oxygen deficiency    2L  HS  . PVD (peripheral vascular disease) ([place])   . Shortness of breath dyspnea   . Systolic heart failure (HCC)    Preserved EF 50-55%    Past Surgical History:  Procedure Laterality Date  . AMPUTATION Right 10/02/2014   Procedure: AMPUTATION RAY;  Surgeon: Albertine Patricia, DPM;  Location: [facility] ORS;  Service: Podiatry;  Laterality: Right;  . CATARACT EXTRACTION W/PHACO Right 09/10/2015   Procedure: CATARACT EXTRACTION PHACO AND INTRAOCULAR LENS PLACEMENT (IOC);  Surgeon: Birder Robson, MD;  Location: [facility] ORS;  Service: Ophthalmology;  Laterality: Right;  [place] 01:34 AP% 19.3 CDE 18.36 Fluid pack lot # [id number]  . CATARACT EXTRACTION W/PHACO Left 10/06/2015   Procedure: CATARACT EXTRACTION PHACO AND INTRAOCULAR LENS PLACEMENT (IOC);  Surgeon: Birder Robson, MD;  Location: [facility] ORS;  Service: Ophthalmology;  Laterality: Left;  [place] 00:56 AP% 19.5 CDE 11.02 Fluid Pack # [id number]  . CHOLECYSTECTOMY    . COLONOSCOPY WITH PROPOFOL N/A 05/17/2016   Procedure: COLONOSCOPY WITH PROPOFOL;  Surgeon: Lollie Sails, MD;  Location: [facility] ENDOSCOPY;  Service: Endoscopy;  Laterality: N/A;  . EYE SURGERY Left    bilat laser  . HIP SURGERY Right    8 th grade pins  . NOSE SURGERY    . PERIPHERAL VASCULAR CATHETERIZATION Right 10/02/2014   Procedure: Lower Extremity Angiography;  Surgeon: Algernon Huxley, MD;  Location: [place] CV LAB;  Service: Cardiovascular;  Laterality: Right;  . TOE AMPUTATION Left 2010   2cd  . TOE AMPUTATION Right 2009     reports that he has never smoked. He has never used smokeless tobacco. He reports that he does not drink alcohol and does not use drugs.  No Known Allergies  Family History  Problem Relation Age of Onset  . Hypertension Mother   . Diabetes Mother   . Stroke Father   . Hypertension Father   .  Heart attack Father     Prior to Admission medications   Medication Sig Start Date End Date Taking? Authorizing Provider  ACCU-CHEK SMARTVIEW test strip  02/06/18   [provider]  amLODipine (NORVASC) 10 MG tablet amlodipine 10 mg tablet    [provider]  aspirin EC 81 MG tablet Take 81 mg by mouth daily.    [provider]  atorvastatin (LIPITOR) 40 MG tablet atorvastatin 40 mg tablet    [provider]  carvedilol (COREG) 6.25 MG tablet Take 1 tablet (6.25 mg total) by mouth 2 (two) times daily. 04/30/17 04/30/18  Rai, Delene Ruffini, MD  cyclobenzaprine (FLEXERIL) 5 MG tablet Take 1 tablet (5 mg  total) by mouth 3 (three) times daily as needed for muscle spasms. 03/11/18   Minna Antis, MD  Dulaglutide (TRULICITY) 0.75 MG/0.5ML SOPN Inject into the skin.    [provider]  gabapentin (NEURONTIN) 300 MG capsule Take 300 mg by mouth 3 (three) times daily.    [provider]  insulin detemir (LEVEMIR) 100 UNIT/ML injection Inject 89 Units into the skin 2 (two) times daily.     [provider]  insulin lispro (HUMALOG) 100 UNIT/ML cartridge Inject 60 Units into the skin 3 (three) times daily with meals.    [provider]  losartan (COZAAR) 50 MG tablet Take 50 mg by mouth daily.    [provider]  metFORMIN (GLUCOPHAGE) 1000 MG tablet Take 1,000 mg by mouth 2 (two) times daily with a meal.    [provider]  Multiple Vitamins-Minerals (CENTRUM SILVER 50+MEN) TABS Take 1 tablet by mouth daily.    [provider]  omega-3 acid ethyl esters (LOVAZA) 1 g capsule Take 1 g by mouth daily.    [provider]  oxyCODONE-acetaminophen (PERCOCET) 5-325 MG tablet Take 1 tablet by mouth every 4 (four) hours as needed for severe pain. 03/11/18   Minna Antis, MD  pantoprazole (PROTONIX) 40 MG tablet Take 40 mg by mouth daily.    [provider]  polyethylene glycol (GOLYTELY/NULYTELY) 236 g solution peg 3350-electrolytes 236 gram-22.74 gram-6.74 gram-5.86 gram solution    [provider]  torsemide (DEMADEX) 20 MG tablet Take 2 tablets (40 mg total) by mouth daily. 05/01/17   Rai, Delene Ruffini, MD  TRUEPLUS INSULIN SYRINGE 29G X 1/2" 1 ML MISC  02/16/18   [provider]  TRUEPLUS PEN NEEDLES 31G X 8 MM MISC  02/06/18   [provider]    Physical Exam: Vitals:   02/05/20 1055 02/05/20 1100 02/05/20 1200 02/05/20 1230  BP:  (!) 149/63 (!) 151/64 (!) 153/69  Pulse: 73 73 69 69  Resp: (!) 26 (!) 21 (!) 26 (!) 25  Temp:      TempSrc:      SpO2: 91% 91% 95% 92%  Weight:      Height:         Constitutional: No acute distress Head: Atraumatic Eyes: Conjunctiva clear ENM: Moist mucous membranes. Normal dentition.  Neck: Supple Respiratory: scattered faint rhonchi, rales @ bases Cardiovascular: Regular rate and rhythm. Distant heart sounds. Faint systolic murmur Abdomen: Non-tender, obese. No masses. No rebound or guarding. Positive bowel sounds. Musculoskeletal: No joint deformity upper and lower extremities. Normal ROM, no contractures. Normal muscle tone.  Skin: No rashes, lesions, or ulcers.  Extremities: LE edema Neurologic: Alert, moving all 4 extremities. Psychiatric: Normal insight and judgement.   Labs on Admission: I have personally reviewed following labs and imaging  studies  CBC: Recent Labs  Lab 02/05/20 1049  WBC 6.3  NEUTROABS 3.5  HGB 10.2*  HCT 32.7*  MCV 83.4  PLT [id number]   Basic Metabolic Panel: Recent Labs  Lab 02/05/20 1049  NA 137  K 3.3*  CL 98  CO2 27  GLUCOSE 342*  BUN 17  CREATININE 1.32*  CALCIUM 8.0*   GFR: Estimated Creatinine Clearance: 103.7 mL/min (A) (by C-G formula based on SCr of 1.32 mg/dL (H)). Liver Function Tests: Recent Labs  Lab 02/05/20 1049  AST 40  ALT 36  ALKPHOS 57  BILITOT 1.1  PROT 6.6  ALBUMIN 3.3*   No results for input(s): LIPASE, AMYLASE in the last 168 hours. No results for input(s): AMMONIA in the last 168 hours. Coagulation Profile: No results for input(s): INR, PROTIME in the last 168 hours. Cardiac Enzymes: No results for input(s): CKTOTAL, CKMB, CKMBINDEX, TROPONINI in the last 168 hours. BNP (last 3 results) No results for input(s): PROBNP in the last 8760 hours. HbA1C: No results for input(s): HGBA1C in the last 72 hours. CBG: No results for input(s): GLUCAP in the last 168 hours. Lipid Profile: No results for input(s): CHOL, HDL, LDLCALC, TRIG, CHOLHDL, LDLDIRECT in the last 72 hours. Thyroid Function Tests: No results for input(s): TSH, T4TOTAL, FREET4, T3FREE, THYROIDAB in  the last 72 hours. Anemia Panel: No results for input(s): VITAMINB12, FOLATE, FERRITIN, TIBC, IRON, RETICCTPCT in the last 72 hours. Urine analysis:    Component Value Date/Time   COLORURINE STRAW (A) 04/28/2017 1214   APPEARANCEUR CLEAR 04/28/2017 1214   LABSPEC 1.006 04/28/2017 1214   PHURINE 5.0 04/28/2017 1214   GLUCOSEU NEGATIVE 04/28/2017 1214   HGBUR NEGATIVE 04/28/2017 1214   BILIRUBINUR NEGATIVE 04/28/2017 1214   KETONESUR NEGATIVE 04/28/2017 1214   PROTEINUR NEGATIVE 04/28/2017 1214   NITRITE NEGATIVE 04/28/2017 1214   LEUKOCYTESUR NEGATIVE 04/28/2017 1214    Radiological Exams on Admission: DG Chest Portable 1 View  Result Date: 02/05/2020 CLINICAL DATA:  Shortness of breath EXAM: PORTABLE CHEST 1 VIEW COMPARISON:  04/28/2017 FINDINGS: Bibasilar airspace disease which may reflect atelectasis versus pneumonia. No pleural effusion or pneumothorax. Heart and mediastinal contours are unremarkable. No acute osseous abnormality. IMPRESSION: Bibasilar airspace disease which may reflect atelectasis versus pneumonia. Electronically Signed   By: Kathreen Devoid   On: 02/05/2020 11:13    EKG: Independently reviewed. nsr  Assessment/Plan Principal Problem:   Pneumonia due to COVID-19 virus Active Problems:   OSA treated with Trilogy machine   Hypoxemia requiring supplemental oxygen   Morbid obesity (HCC)   Chronic diastolic heart failure (HCC)   HTN (hypertension)   Diabetes ([place])   # Covid pneumonia # acute on chronic hypoxic respiratory failure # Tropinemia Exposed, symptoms began 1/4, test positive. Requiring 3 L [place] to maintain o2 above 94, up from baseline of 2 L. bnp wnl, trop mildly elevated and flat (36>32) w/o ischemic changes on ekg and w/o chest pain, likely demand.  - cont O2 - cont dexamethasone, remdesivir - check and trend inflammatory markers - pulmonary toilet ordered - contact/airborne precautioins  # HTN Here bp moderately elevated - cont home  amlodipine, atorvastatin, htn, torsenide, losartan  # dCHF bnp wnl, does not appear to be in exacerbation - cont home torsemide  # Hypokalemia Mild, likely 2/2 diuretic - f/u mg - start kcl 40 meq qd  # T2DM Here glucose in 300s - resume home levemir 89 bid - start home lispro but at 50 qd from 60 -  SSI resistant - hold home metformin  # Chronic pain - cont home gabapentin  # OSA On trilogy at home, doesn't have it to use here - cpap ordered   DVT prophylaxis: lovenox Code Status: full  Family Communication: attempted to call friend and primary contact [name], no answer  Consults called: none    Status is: Inpatient  Remains inpatient appropriate because:Inpatient level of care appropriate due to severity of illness   Dispo: The patient is from: Home              Anticipated d/c is to: Home              Anticipated d/c date is: 2-4 days              Patient currently is not medically stable to d/c        Desma Maxim MD [facility] Pager [phone]  If 7PM-7AM, please contact night-coverage [email or web address] Password [facility]  02/05/2020, 2:37 PM

## 2020-02-05 NOTE — Progress Notes (Signed)
Inpatient Diabetes Program Recommendations  AACE/ADA: New Consensus Statement on Inpatient Glycemic Control (2015)  Target Ranges:  Prepandial:   less than 140 mg/dL      Peak postprandial:   less than 180 mg/dL (1-2 hours)      Critically ill patients:  140 - 180 mg/dL   Lab Results  Component Value Date   GLUCAP 163 (H) 04/30/2017   HGBA1C 9.0 (H) 04/28/2017    Review of Glycemic Control Results for TAEVION, SIKORA (MRN [id number]) as of 02/05/2020 12:22  Ref. Range 02/05/2020 10:49  Glucose Latest Ref Range: 70 - 99 mg/dL 825 (H)   Diabetes history: DM 2 Outpatient Diabetes medications:  Levemir 89 units bid, Humalog 60 units tid with meals, Metformin 1000 mg bid Current orders for Inpatient glycemic control:  None yet Inpatient Diabetes Program Recommendations:    Please consider restarting 1/2 of patient's home insulin if patient is admitted.   Thanks Beryl Meager, RN, BC-ADM Inpatient Diabetes Coordinator Pager [phone] (8a-5p)

## 2020-02-05 NOTE — ED Notes (Signed)
Lab called and advised that A1C , Mag , HIV needed to be drawn. Blood drawn and sent to lab

## 2020-02-05 NOTE — ED Provider Notes (Addendum)
[facility] Emergency Department Provider Note  ____________________________________________   Event Date/Time   First MD Initiated Contact with Patient 02/05/20 1029     (approximate)  I have reviewed the triage vital signs and the nursing notes.   HISTORY  Chief Complaint Shortness of Breath    HPI Raymond [name] is a 55 y.o. male with past medical history as below including morbid obesity, CHF, hypertension, chronic hypoxia, here with shortness of breath and generalized weakness.  The patient states that starting approximately 2 to 3 days ago, he began to develop fevers, general fatigue, and chills.  He states that his family member who lives with him was diagnosed with Covid this week.  He has had associated generalized weakness.  Over the last 2 days, he has developed associated increasing shortness of breath.  Over the last day, he has had difficulty even getting around his house.  He is not sure what his oxygen sats have been.  He has had increasing cough without sputum production.  Of note, he received 2 vaccines but has not received a booster yet.  Denies any chest pain.  Denies any other acute medical complaints.  No nausea or vomiting.  No diarrhea.        Past Medical History:  Diagnosis Date  . Arthritis    hands  . Cellulitis   . CHF (congestive heart failure) ([place])   . Chronic diastolic heart failure ([place])   . Chronic respiratory failure with hypoxia (HCC)    2 L [place] continuously  . Diabetes mellitus type 2, insulin dependent ([place])   . Edema    FEET/LEGS  . GERD (gastroesophageal reflux disease)   . Hepatic steatosis   . HOH (hard of hearing)   . Hypertension   . Morbid obesity ([place])   . Morbid obesity with BMI of 45.0-49.9, adult ([place])   . Neuropathy   . Orthopnea   . OSA on CPAP    TRILOGY VENTILATOR  . Oxygen deficiency    2L  HS  . PVD (peripheral vascular disease) ([place])   . Shortness of breath dyspnea   .  Systolic heart failure (HCC)    Preserved EF 50-55%    Patient Active Problem List   Diagnosis Date Noted  . Acute on chronic respiratory failure with hypoxia ([place]) 04/28/2017  . Acute kidney injury ([place]) 04/28/2017  . Hypotension 04/28/2017  . PVD (peripheral vascular disease) ([place]) 04/28/2017  . Hypertension 04/28/2017  . Diabetes mellitus type 2, insulin dependent ([place]) 04/28/2017  . OSA on CPAP 04/28/2017  . Chronic diastolic heart failure, NYHA class 1 ([place]) 04/28/2017  . Chest pain with moderate risk for cardiac etiology 04/28/2017  . Morbid obesity with BMI of 45.0-49.9, adult ([place]) 04/28/2017  . Lymphedema 02/11/2017  . HTN (hypertension) 02/01/2017  . Diabetes ([place]) 02/01/2017  . Pneumonia 01/13/2017  . Cellulitis of right foot 09/30/2014  . COPD, moderate ([place]) 07/07/2014  . Chronic diastolic heart failure ([place]) 05/06/2014  . OSA treated with Trilogy machine 04/28/2014  . Hypoxemia requiring supplemental oxygen 04/28/2014  . Morbid obesity ([place]) 04/28/2014  . Hyperlipidemia 07/09/2013    Past Surgical History:  Procedure Laterality Date  . AMPUTATION Right 10/02/2014   Procedure: AMPUTATION RAY;  Surgeon: Albertine Patricia, DPM;  Location: [facility] ORS;  Service: Podiatry;  Laterality: Right;  . CATARACT EXTRACTION W/PHACO Right 09/10/2015   Procedure: CATARACT EXTRACTION PHACO AND INTRAOCULAR LENS PLACEMENT (IOC);  Surgeon: Birder Robson, MD;  Location: [facility] ORS;  Service: Ophthalmology;  Laterality: Right;  [place] 01:34 AP% 19.3 CDE 18.36 Fluid pack lot # [id number]  . CATARACT EXTRACTION W/PHACO Left 10/06/2015   Procedure: CATARACT EXTRACTION PHACO AND INTRAOCULAR LENS PLACEMENT (IOC);  Surgeon: Birder Robson, MD;  Location: [facility] ORS;  Service: Ophthalmology;  Laterality: Left;  [place] 00:56 AP% 19.5 CDE 11.02 Fluid Pack # [id number]  . CHOLECYSTECTOMY    . COLONOSCOPY WITH PROPOFOL N/A 05/17/2016   Procedure: COLONOSCOPY WITH PROPOFOL;  Surgeon: Lollie Sails, MD;   Location[facility] ENDOSCOPY;  Service: Endoscopy;  Laterality: N/A;  . EYE SURGERY Left    bilat laser  . HIP SURGERY Right    8 th grade pins  . NOSE SURGERY    . PERIPHERAL VASCULAR CATHETERIZATION Right 10/02/2014   Procedure: Lower Extremity Angiography;  Surgeon: Algernon Huxley, MD;  Location: [facility] CV LAB;  Service: Cardiovascular;  Laterality: Right;  . TOE AMPUTATION Left 2010   2cd  . TOE AMPUTATION Right 2009    Prior to Admission medications   Medication Sig Start Date End Date Taking? Authorizing Provider  ACCU-CHEK SMARTVIEW test strip  02/06/18   [provider]  amLODipine (NORVASC) 10 MG tablet amlodipine 10 mg tablet    [provider]  aspirin EC 81 MG tablet Take 81 mg by mouth daily.    [provider]  atorvastatin (LIPITOR) 40 MG tablet atorvastatin 40 mg tablet    [provider]  carvedilol (COREG) 6.25 MG tablet Take 1 tablet (6.25 mg total) by mouth 2 (two) times daily. 04/30/17 04/30/18  Rai, Vernelle Emerald, MD  cyclobenzaprine (FLEXERIL) 5 MG tablet Take 1 tablet (5 mg total) by mouth 3 (three) times daily as needed for muscle spasms. 03/11/18   Harvest Dark, MD  Dulaglutide (TRULICITY) [id number] SOPN Inject into the skin.    [provider]  gabapentin (NEURONTIN) 300 MG capsule Take 300 mg by mouth 3 (three) times daily.    [provider]  insulin detemir (LEVEMIR) 100 UNIT/ML injection Inject 89 Units into the skin 2 (two) times daily.     [provider]  insulin lispro (HUMALOG) 100 UNIT/ML cartridge Inject 60 Units into the skin 3 (three) times daily with meals.    [provider]  losartan (COZAAR) 50 MG tablet Take 50 mg by mouth daily.    [provider]  metFORMIN (GLUCOPHAGE) 1000 MG tablet Take 1,000 mg by mouth 2 (two) times daily with a meal.    [provider]  Multiple Vitamins-Minerals (CENTRUM SILVER 50+MEN) TABS Take 1 tablet by mouth daily.    [provider]  omega-3 acid ethyl esters (LOVAZA) 1 g capsule Take 1 g by mouth daily.    [provider]  oxyCODONE-acetaminophen (PERCOCET) 5-325 MG tablet Take 1 tablet by mouth every 4 (four) hours as needed for severe pain. 03/11/18   Harvest Dark, MD  pantoprazole (PROTONIX) 40 MG tablet Take 40 mg by mouth daily.    [provider]  polyethylene glycol (GOLYTELY/NULYTELY) 236 g solution peg 3350-electrolytes 236 gram-22.74 gram-6.74 gram-5.86 gram solution    [provider]  torsemide (DEMADEX) 20 MG tablet Take 2 tablets (40 mg total) by mouth daily. 05/01/17   Rai, Vernelle Emerald, MD  TRUEPLUS INSULIN SYRINGE 29G X 1/2" 1 ML MISC  02/16/18   [provider]  TRUEPLUS PEN NEEDLES 31G X 8 MM [place]  02/06/18   [provider]    Allergies Patient has no  known allergies.  Family History  Problem Relation Age of Onset  . Hypertension Mother   . Diabetes Mother   . Stroke Father   . Hypertension Father   . Heart attack Father     Social History Social History   Tobacco Use  . Smoking status: Never Smoker  . Smokeless tobacco: Never Used  Vaping Use  . Vaping Use: Never used  Substance Use Topics  . Alcohol use: No    Alcohol/week: 0.0 standard drinks  . Drug use: No    Review of Systems  Review of Systems  Constitutional: Positive for fever. Negative for chills.  HENT: Negative for sore throat.   Respiratory: Positive for cough and shortness of breath.   Cardiovascular: Negative for chest pain.  Gastrointestinal: Negative for abdominal pain.  Genitourinary: Negative for flank pain.  Musculoskeletal: Negative for neck pain.  Skin: Negative for rash and wound.  Allergic/Immunologic: Negative for immunocompromised state.  Neurological: Negative for weakness and numbness.  Hematological: Does not bruise/bleed easily.  All other systems reviewed and are negative.    ____________________________________________  PHYSICAL  EXAM:      VITAL SIGNS: ED Triage Vitals  Enc Vitals Group     BP 02/05/20 1040 (!) 154/71     Pulse Rate 02/05/20 1040 75     Resp 02/05/20 1040 19     Temp 02/05/20 1040 99.6 F (37.6 C)     Temp Source 02/05/20 1040 Oral     SpO2 02/05/20 1035 (!) 83 %     Weight 02/05/20 1031 (!) 374 lb 12.5 oz (170 kg)     Height 02/05/20 1031 6\' 2"  (1.88 m)     Head Circumference --      Peak Flow --      Pain Score --      Pain Loc --      Pain Edu? --      Excl. in [place]? --      Physical Exam Vitals and nursing note reviewed.  Constitutional:      General: He is not in acute distress.    Appearance: He is well-developed and well-nourished. He is obese.  HENT:     Head: Normocephalic and atraumatic.  Eyes:     Conjunctiva/sclera: Conjunctivae normal.  Cardiovascular:     Rate and Rhythm: Normal rate and regular rhythm.     Heart sounds: Normal heart sounds.  Pulmonary:     Effort: Pulmonary effort is normal. No respiratory distress.     Breath sounds: Examination of the right-middle field reveals rales. Examination of the left-middle field reveals rales. Examination of the right-lower field reveals rales. Examination of the left-lower field reveals rales. Rales present. No wheezing.  Abdominal:     General: There is no distension.  Musculoskeletal:        General: No edema.     Cervical back: Neck supple.  Skin:    General: Skin is warm.     Capillary Refill: Capillary refill takes less than 2 seconds.     Findings: No rash.  Neurological:     Mental Status: He is alert and oriented to person, place, and time.     Motor: No abnormal muscle tone.       ____________________________________________   LABS (all labs ordered are listed, but only abnormal results are displayed)  Labs Reviewed  CBC WITH DIFFERENTIAL/PLATELET - Abnormal; Notable for the following components:      Result Value   RBC 3.92 (*)  Hemoglobin 10.2 (*)    HCT 32.7 (*)    Monocytes Absolute 1.2  (*)    All other components within normal limits  COMPREHENSIVE METABOLIC PANEL - Abnormal; Notable for the following components:   Potassium 3.3 (*)    Glucose, Bld 342 (*)    Creatinine, Ser 1.32 (*)    Calcium 8.0 (*)    Albumin 3.3 (*)    All other components within normal limits  POC SARS CORONAVIRUS 2 AG -  ED - Abnormal; Notable for the following components:   SARS Coronavirus 2 Ag POSITIVE (*)    All other components within normal limits  TROPONIN I (HIGH SENSITIVITY) - Abnormal; Notable for the following components:   Troponin I (High Sensitivity) 36 (*)    All other components within normal limits  BRAIN NATRIURETIC PEPTIDE  PROCALCITONIN  TROPONIN I (HIGH SENSITIVITY)    ____________________________________________  EKG: Normal sinus rhythm, ventricular rate 74.  QRS 114, QTc 449.  No acute ST elevations or depressions.  No EKG evidence of acute ischemia or infarct. ________________________________________  RADIOLOGY All imaging, including plain films, CT scans, and ultrasounds, independently reviewed by me, and interpretations confirmed via formal radiology reads.  ED MD interpretation:   Chest x-ray: Bibasilar airspace disease.  Official radiology report(s): DG Chest Portable 1 View  Result Date: 02/05/2020 CLINICAL DATA:  Shortness of breath EXAM: PORTABLE CHEST 1 VIEW COMPARISON:  04/28/2017 FINDINGS: Bibasilar airspace disease which may reflect atelectasis versus pneumonia. No pleural effusion or pneumothorax. Heart and mediastinal contours are unremarkable. No acute osseous abnormality. IMPRESSION: Bibasilar airspace disease which may reflect atelectasis versus pneumonia. Electronically Signed   By: Kathreen Devoid   On: 02/05/2020 11:13    ____________________________________________  PROCEDURES   Procedure(s) performed (including Critical Care):  .1-3 Lead EKG Interpretation Performed by: Duffy Bruce, MD Authorized by: Duffy Bruce, MD      Interpretation: normal     ECG rate:  60-80   ECG rate assessment: normal     Rhythm: sinus rhythm     Ectopy: none     Conduction: normal   Comments:     Indication: SOB  .Critical Care Performed by: Duffy Bruce, MD Authorized by: Duffy Bruce, MD   Critical care provider statement:    Critical care time (minutes):  35   Critical care time was exclusive of:  Separately billable procedures and treating other patients and teaching time   Critical care was necessary to treat or prevent imminent or life-threatening deterioration of the following conditions:  Circulatory failure, cardiac failure and respiratory failure   Critical care was time spent personally by me on the following activities:  Development of treatment plan with patient or surrogate, discussions with consultants, evaluation of patient's response to treatment, examination of patient, obtaining history from patient or surrogate, ordering and performing treatments and interventions, ordering and review of laboratory studies, ordering and review of radiographic studies, pulse oximetry, re-evaluation of patient's condition and review of old charts   I assumed direction of critical care for this patient from another provider in my specialty: no      ____________________________________________  INITIAL IMPRESSION / MDM / [place] / ED COURSE  As part of my medical decision making, I reviewed the following data within the [place] notes reviewed and incorporated, Old chart reviewed, Notes from prior ED visits, and [place] Controlled Substance Database       *Raymond [name] was evaluated in Emergency Department on  02/05/2020 for the symptoms described in the history of present illness. He was evaluated in the context of the global COVID-19 pandemic, which necessitated consideration that the patient might be at risk for infection with the SARS-CoV-2 virus that causes COVID-19.  Institutional protocols and algorithms that pertain to the evaluation of patients at risk for COVID-19 are in a state of rapid change based on information released by regulatory bodies including the CDC and federal and state organizations. These policies and algorithms were followed during the patient's care in the ED.  Some ED evaluations and interventions may be delayed as a result of limited staffing during the pandemic.*     Medical Decision Making: 55 year old male here with shortness of breath and acute on chronic hypoxic respiratory failure.  Patient mildly tachypneic and on 1 L more than his baseline.  However, he is speaking in full sentences and in no obvious respiratory distress.  The patient has a known COVID sick contact, and rapid COVID here is positive.  Chest x-ray reviewed by me, shows significant bilateral airspace disease due to COVID-19.  CBC without leukocytosis.  CMP largely unremarkable.  BNP is normal.  Trope minimally elevated likely demand related and EKG is nonischemic.  Will admit for acute on chronic hypoxia secondary to COVID-19.  Patient was given IV steroids and remdesivir.  ____________________________________________  FINAL CLINICAL IMPRESSION(S) / ED DIAGNOSES  Final diagnoses:  Acute hypoxemic respiratory failure due to COVID-19 (HCC)  Elevated troponin     MEDICATIONS GIVEN DURING THIS VISIT:  Medications  methylPREDNISolone sodium succinate (SOLU-MEDROL) 170 mg in sodium chloride 0.9 % 50 mL IVPB (has no administration in time range)  remdesivir 200 mg in sodium chloride 0.9% 250 mL IVPB (200 mg Intravenous New Bag/Given 02/05/20 1213)    Followed by  remdesivir 100 mg in sodium chloride 0.9 % 100 mL IVPB (has no administration in time range)  acetaminophen (TYLENOL) tablet 650 mg (650 mg Oral Given 02/05/20 1214)     ED Discharge Orders    None       Note:  This document was prepared using Dragon voice recognition software and may include  unintentional dictation errors.   Shaune Pollack, MD 02/05/20 1236    Shaune Pollack, MD 02/23/20 [phone]

## 2020-02-05 NOTE — ED Notes (Signed)
Lab advised they had an extra tube to run A1C

## 2020-02-05 NOTE — ED Notes (Signed)
Message pharmacy regarding missing meds

## 2020-02-05 NOTE — Consult Note (Signed)
Remdesivir - Pharmacy Brief Note   O:  ALT: 36 CXR: Bibasilar airspace disease which may reflect atelectasis versus pneumonia. SpO2: 91% on 4L [place]   A/P:  Remdesivir 200 mg IVPB once followed by 100 mg IVPB daily x 4 days.   Martyn Malay, [facility] 02/05/2020 11:41 AM

## 2020-02-05 NOTE — ED Notes (Signed)
Repositioned leads on pt and gave pt water. Pt states he is comfortable doesn't need anything at the moment.

## 2020-02-05 NOTE — ED Triage Notes (Signed)
BIBA from home for SOB since yesterday. COVID+ housemate.  Patient is vaccinated but not boosted. H/o CHF on 2L [place] baseline. 100.25F temp, FSBG 422. Patient awake alert in NAD.

## 2020-02-06 ENCOUNTER — Encounter: Payer: Self-pay | Admitting: Obstetrics and Gynecology

## 2020-02-06 DIAGNOSIS — I5032 Chronic diastolic (congestive) heart failure: Secondary | ICD-10-CM

## 2020-02-06 DIAGNOSIS — Z794 Long term (current) use of insulin: Secondary | ICD-10-CM

## 2020-02-06 DIAGNOSIS — G4733 Obstructive sleep apnea (adult) (pediatric): Secondary | ICD-10-CM

## 2020-02-06 DIAGNOSIS — E11620 Type 2 diabetes mellitus with diabetic dermatitis: Secondary | ICD-10-CM

## 2020-02-06 DIAGNOSIS — U071 COVID-19: Secondary | ICD-10-CM | POA: Diagnosis not present

## 2020-02-06 DIAGNOSIS — I10 Essential (primary) hypertension: Secondary | ICD-10-CM

## 2020-02-06 LAB — CBC WITH DIFFERENTIAL/PLATELET
Abs Immature Granulocytes: 0.03 10*3/uL (ref 0.00–0.07)
Basophils Absolute: 0 10*3/uL (ref 0.0–0.1)
Basophils Relative: 0 %
Eosinophils Absolute: 0 10*3/uL (ref 0.0–0.5)
Eosinophils Relative: 0 %
HCT: 36.7 % — ABNORMAL LOW (ref 39.0–52.0)
Hemoglobin: 11.4 g/dL — ABNORMAL LOW (ref 13.0–17.0)
Immature Granulocytes: 1 %
Lymphocytes Relative: 23 %
Lymphs Abs: 1.4 10*3/uL (ref 0.7–4.0)
MCH: 25.8 pg — ABNORMAL LOW (ref 26.0–34.0)
MCHC: 31.1 g/dL (ref 30.0–36.0)
MCV: 83 fL (ref 80.0–100.0)
Monocytes Absolute: 1 10*3/uL (ref 0.1–1.0)
Monocytes Relative: 15 %
Neutro Abs: 3.8 10*3/uL (ref 1.7–7.7)
Neutrophils Relative %: 61 %
Platelets: 249 10*3/uL (ref 150–400)
RBC: 4.42 MIL/uL (ref 4.22–5.81)
RDW: 14.8 % (ref 11.5–15.5)
WBC: 6.2 10*3/uL (ref 4.0–10.5)
nRBC: 0 % (ref 0.0–0.2)

## 2020-02-06 LAB — COMPREHENSIVE METABOLIC PANEL
ALT: 41 U/L (ref 0–44)
AST: 37 U/L (ref 15–41)
Albumin: 3.4 g/dL — ABNORMAL LOW (ref 3.5–5.0)
Alkaline Phosphatase: 59 U/L (ref 38–126)
Anion gap: 11 (ref 5–15)
BUN: 24 mg/dL — ABNORMAL HIGH (ref 6–20)
CO2: 28 mmol/L (ref 22–32)
Calcium: 8.4 mg/dL — ABNORMAL LOW (ref 8.9–10.3)
Chloride: 99 mmol/L (ref 98–111)
Creatinine, Ser: 1.36 mg/dL — ABNORMAL HIGH (ref 0.61–1.24)
GFR, Estimated: >60 mL/min (ref >60)
Glucose, Bld: 455 mg/dL — ABNORMAL HIGH (ref 70–99)
Potassium: 4.5 mmol/L (ref 3.5–5.1)
Sodium: 138 mmol/L (ref 135–145)
Total Bilirubin: 0.8 mg/dL (ref 0.3–1.2)
Total Protein: 7.2 g/dL (ref 6.5–8.1)

## 2020-02-06 LAB — CBG MONITORING, ED
Glucose-Capillary: 451 mg/dL — ABNORMAL HIGH (ref 70–99)
Glucose-Capillary: 392 mg/dL — ABNORMAL HIGH (ref 70–99)
Glucose-Capillary: 340 mg/dL — ABNORMAL HIGH (ref 70–99)
Glucose-Capillary: 410 mg/dL — ABNORMAL HIGH (ref 70–99)

## 2020-02-06 LAB — FERRITIN: Ferritin: 617 ng/mL — ABNORMAL HIGH (ref 24–336)

## 2020-02-06 LAB — GLUCOSE, CAPILLARY: Glucose-Capillary: 451 mg/dL — ABNORMAL HIGH (ref 70–99)

## 2020-02-06 LAB — PHOSPHORUS: Phosphorus: 3.9 mg/dL (ref 2.5–4.6)

## 2020-02-06 LAB — C-REACTIVE PROTEIN: CRP: 12.9 mg/dL — ABNORMAL HIGH (ref <1.0)

## 2020-02-06 MED ORDER — INSULIN GLARGINE 100 UNIT/ML ~~LOC~~ SOLN
20.0000 [IU] | Freq: Two times a day (BID) | SUBCUTANEOUS | Status: DC
  Administered 2020-02-06 – 2020-02-07 (×3): 20 [IU] via SUBCUTANEOUS
  Filled 2020-02-06 (×7): qty 0.2

## 2020-02-06 MED ORDER — METHYLPREDNISOLONE SODIUM SUCC 40 MG IJ SOLR
40.0000 mg | Freq: Two times a day (BID) | INTRAMUSCULAR | Status: DC
  Administered 2020-02-06 – 2020-02-12 (×12): 40 mg via INTRAVENOUS
  Filled 2020-02-06 (×12): qty 1

## 2020-02-06 MED ORDER — INSULIN ASPART 100 UNIT/ML ~~LOC~~ SOLN
20.0000 [IU] | Freq: Once | SUBCUTANEOUS | Status: AC
  Administered 2020-02-06: 20 [IU] via SUBCUTANEOUS
  Filled 2020-02-06: qty 1

## 2020-02-06 NOTE — ED Notes (Addendum)
Pt switched from cpap to 4L [place] upon awakenin g

## 2020-02-06 NOTE — ED Notes (Signed)
Pt lying awake in bed listening to music on cellphone and watching tv -- alert and oriented x4 pt denies any complaints, concerns, needs at this time.  RR even and unlabored on 6L O2 via [place].  Continuous pulse ox and cardiac monitoring maintained- NSR HR 90.  Abdomen soft, nontender.  Skin warm dry and intact.  Will monitor for acute changes and maintain plan of care.

## 2020-02-06 NOTE — ED Notes (Addendum)
Pt states coming in due to covid-19. Pt currently in room resting. Pt on cardiac, bp and pulse ox monitor. Pt provided with water to drink and a hand held urinal.

## 2020-02-06 NOTE — ED Notes (Signed)
Dr Joseph Art messaged via secure chat to notify of CBG [id number] and verify order for 70 units of novolog

## 2020-02-06 NOTE — ED Notes (Signed)
Report given to nurse at this time

## 2020-02-06 NOTE — ED Notes (Signed)
Pt noted to be sleeping. pts oxygen saturation was in the 80s. pts oxygen increased from 4lpm to 6lpm. Pts oxygen saturation 91%

## 2020-02-06 NOTE — ED Notes (Signed)
Per Dr Delrae Sawyers, do not give 50 units of insulin, give sliding scale only

## 2020-02-06 NOTE — Progress Notes (Signed)
PROGRESS NOTE    Raymond [name]  [id number] DOB: 1965/09/14 DOA: 02/05/2020 PCP: [name], NP    Brief Narrative:  Raymond [name] was admitted to the hospital with the working diagnosis of acute hypoxic respiratory failure due to SARS COVID-19 viral pneumonia.  55 year old male with significant past medical history for type 2 diabetes mellitus, obstructive sleep apnea, diastolic heart failure and obesity class III who presented with worsening cough and dyspnea.  Positive sick contact at home for 2 days prior to hospitalization.  At home uses 2 L of oxygen.  He has been vaccinated but has not received booster dose.  On his initial physical examination blood pressure 149/63, heart rate 73, respirate 21, oxygen saturation 91% on supplemental oxygen, her lungs had scattered rhonchi, rales at bases, no wheezing, heart S1-S2, present, rhythmic, abdomen soft, nontender, no lower extremity edema.  Sodium 137, potassium 3.3, chloride 98, bicarb 27, glucose 342, BUN 17, creatinine 1.32, white count 6.3, hemoglobin 8.2, hematocrit 32.7, platelets 220.  Chest radiograph with bilateral lower lobe interstitial infiltrates.    Assessment & Plan:   Principal Problem:   Pneumonia due to COVID-19 virus Active Problems:   OSA treated with Trilogy machine   Hypoxemia requiring supplemental oxygen   Morbid obesity (HCC)   Chronic diastolic heart failure (HCC)   HTN (hypertension)   Diabetes ([place])  1.  Acute hypoxic respiratory failure due to SARS COVID-19 viral pneumonia.  RR: 22  Pulse oxymetry: 93%  Fi02: 4 L/min per [place]   COVID-19 Labs  Recent Labs    02/06/20 0435  FERRITIN 617*  CRP 12.9*    No results found for: [place]  Patient with elevated inflammatory markers, dyspnea stable, at home uses 2 L/min per [place]. He has been vaccinated x2 doses.  Continue medical therapy with systemic steroids methylprednisolone and Remdesivir. If worsening inflammatory markers  will plan to start baricitinib.  Antitussive agents, bronchodilators and airway clearing techniques.  Out of bed to chair tid with meals. PT and OT evaluation.   Keep oxygen saturation 88% or greater, follow with inflammatory markers.   2. HTN/ diastolic heart failure. Continue blood pressure control with amlodipine and losartan.  Continue diuretic therapy with torsemide. No signs of heart failure exacerbation.   3. T2DM, uncontrolled, steroid induced hyperglycemia. Continue glucose cover and monitoring with insulin sliding scale and basal insulin. At home on very high doses, to prevent hypoglycemia, will start lower dose and will adjust as tolerated.   4. Obesity class 3/ OSA. Calculated BMI is 48,7, continue Cpap at night per protocol.   Patient continue to be at high risk for worsening respiratory failure   Status is: Inpatient  Remains inpatient appropriate because:IV treatments appropriate due to intensity of illness or inability to take PO   Dispo: The patient is from: Home              Anticipated d/c is to: Home              Anticipated d/c date is: 3 days              Patient currently is not medically stable to d/c.   DVT prophylaxis: Enoxaparin   Code Status:   full  Family Communication:  No family at the bedside      Subjective: Patient continue to have dyspnea, improved in intensity but not yet back to baseline, no nausea or vomiting. Tolerating po well.   Objective: Vitals:   02/06/20 0500 02/06/20 0700  02/06/20 0715 02/06/20 1120  BP: 99/67 135/72  (!) 162/82  Pulse: 69 67 65 69  Resp: (!) 21 (!) 26 (!) 21 (!) 22  Temp:      TempSrc:      SpO2: 93% 98% 91% 93%  Weight:      Height:        Intake/Output Summary (Last 24 hours) at 02/06/2020 1358 Last data filed at 02/06/2020 0723 Gross per 24 hour  Intake -  Output 3780 ml  Net -3780 ml   Filed Weights   02/05/20 1031  Weight: (!) 170 kg    Examination:   General: Not in pain or dyspnea.  Deconditioned  Neurology: Awake and alert, non focal  E ENT: mild pallor, no icterus, oral mucosa moist Cardiovascular: No JVD. S1-S2 present, rhythmic, no gallops, rubs, or murmurs. No lower extremity edema. Pulmonary: positive breath sounds bilaterally, no wheezing Gastrointestinal. Abdomen protuberant, but not tender Skin. No rashes Musculoskeletal: no joint deformities     Data Reviewed: I have personally reviewed following labs and imaging studies  CBC: Recent Labs  Lab 02/05/20 1049 02/06/20 0435  WBC 6.3 6.2  NEUTROABS 3.5 3.8  HGB 10.2* 11.4*  HCT 32.7* 36.7*  MCV 83.4 83.0  PLT 220 0000000   Basic Metabolic Panel: Recent Labs  Lab 02/05/20 1049 02/05/20 1526 02/06/20 0435  NA 137  --  138  K 3.3*  --  4.5  CL 98  --  99  CO2 27  --  28  GLUCOSE 342*  --  455*  BUN 17  --  24*  CREATININE 1.32*  --  1.36*  CALCIUM 8.0*  --  8.4*  MG  --  1.6*  --   PHOS  --   --  3.9   GFR: Estimated Creatinine Clearance: 100.6 mL/min (A) (by C-G formula based on SCr of 1.36 mg/dL (H)). Liver Function Tests: Recent Labs  Lab 02/05/20 1049 02/06/20 0435  AST 40 37  ALT 36 41  ALKPHOS 57 59  BILITOT 1.1 0.8  PROT 6.6 7.2  ALBUMIN 3.3* 3.4*   No results for input(s): LIPASE, AMYLASE in the last 168 hours. No results for input(s): AMMONIA in the last 168 hours. Coagulation Profile: No results for input(s): INR, PROTIME in the last 168 hours. Cardiac Enzymes: No results for input(s): CKTOTAL, CKMB, CKMBINDEX, TROPONINI in the last 168 hours. BNP (last 3 results) No results for input(s): PROBNP in the last 8760 hours. HbA1C: Recent Labs    02/05/20 1526  HGBA1C 7.8*   CBG: Recent Labs  Lab 02/05/20 1857 02/05/20 2152 02/06/20 0220 02/06/20 0724 02/06/20 1200  GLUCAP 392* 468* 451* 392* 340*   Lipid Profile: No results for input(s): CHOL, HDL, LDLCALC, TRIG, CHOLHDL, LDLDIRECT in the last 72 hours. Thyroid Function Tests: No results for input(s): TSH,  T4TOTAL, FREET4, T3FREE, THYROIDAB in the last 72 hours. Anemia Panel: Recent Labs    02/06/20 0435  FERRITIN 617*      Radiology Studies: I have reviewed all of the imaging during this hospital visit personally     Scheduled Meds: . amLODipine  10 mg Oral Daily  . aspirin EC  81 mg Oral Daily  . dexamethasone  6 mg Oral Q24H  . enoxaparin (LOVENOX) injection  85 mg Subcutaneous Q24H  . gabapentin  300 mg Oral TID  . insulin aspart  0-20 Units Subcutaneous TID WC  . insulin aspart  0-5 Units Subcutaneous QHS  . insulin glargine  20 Units Subcutaneous BID  . losartan  50 mg Oral Daily  . pantoprazole  40 mg Oral Daily  . potassium chloride  40 mEq Oral Daily  . sodium chloride flush  3 mL Intravenous Q12H  . torsemide  40 mg Oral Daily   Continuous Infusions: . sodium chloride    . remdesivir 100 mg in NS 100 mL Stopped (02/06/20 1225)     LOS: 1 day        Shereta Crothers Annett Gula, MD

## 2020-02-06 NOTE — ED Notes (Signed)
Pt eating breakfast 

## 2020-02-06 NOTE — ED Notes (Signed)
Pt resting, on cpap. Call bell within reach

## 2020-02-06 NOTE — Progress Notes (Signed)
Inpatient Diabetes Program Recommendations  AACE/ADA: New Consensus Statement on Inpatient Glycemic Control (2015)  Target Ranges:  Prepandial:   less than 140 mg/dL      Peak postprandial:   less than 180 mg/dL (1-2 hours)      Critically ill patients:  140 - 180 mg/dL   Lab Results  Component Value Date   GLUCAP 392 (H) 02/06/2020   HGBA1C 7.8 (H) 02/05/2020    Review of Glycemic Control Results for Raymond [name], Raymond [name] (MRN [id number]) as of 02/06/2020 09:01  Ref. Range 02/05/2020 18:57 02/05/2020 21:52 02/06/2020 02:20 02/06/2020 07:24  Glucose-Capillary Latest Ref Range: 70 - 99 mg/dL 038 (H) 882 (H) 800 (H) 392 (H)   Diabetes history: DM 2 Outpatient Diabetes medications:  Levemir 89 units bid, Humalog 60 units tid with meals, Metformin 1000 mg bid Current orders for Inpatient glycemic control:  Decadron 6 mg daily Novolog resistant tid with meals and HS Lantus 20 units bid  Inpatient Diabetes Program Recommendations:   Consider changing Levemir to 55 units bid Add Novolog meal coverage 20 units tid with meals.   Thanks,  Beryl Meager, RN, BC-ADM Inpatient Diabetes Coordinator Pager [phone] (8a-5p)

## 2020-02-07 DIAGNOSIS — I10 Essential (primary) hypertension: Secondary | ICD-10-CM | POA: Diagnosis not present

## 2020-02-07 DIAGNOSIS — E1165 Type 2 diabetes mellitus with hyperglycemia: Secondary | ICD-10-CM

## 2020-02-07 DIAGNOSIS — I5032 Chronic diastolic (congestive) heart failure: Secondary | ICD-10-CM | POA: Diagnosis not present

## 2020-02-07 DIAGNOSIS — U071 COVID-19: Secondary | ICD-10-CM | POA: Diagnosis not present

## 2020-02-07 DIAGNOSIS — J9601 Acute respiratory failure with hypoxia: Secondary | ICD-10-CM

## 2020-02-07 LAB — CBC WITH DIFFERENTIAL/PLATELET
Abs Immature Granulocytes: 0.03 10*3/uL (ref 0.00–0.07)
Basophils Absolute: 0 10*3/uL (ref 0.0–0.1)
Basophils Relative: 0 %
Eosinophils Absolute: 0 10*3/uL (ref 0.0–0.5)
Eosinophils Relative: 0 %
HCT: 37.9 % — ABNORMAL LOW (ref 39.0–52.0)
Hemoglobin: 11.9 g/dL — ABNORMAL LOW (ref 13.0–17.0)
Immature Granulocytes: 1 %
Lymphocytes Relative: 17 %
Lymphs Abs: 1.1 10*3/uL (ref 0.7–4.0)
MCH: 26.2 pg (ref 26.0–34.0)
MCHC: 31.4 g/dL (ref 30.0–36.0)
MCV: 83.5 fL (ref 80.0–100.0)
Monocytes Absolute: 0.7 10*3/uL (ref 0.1–1.0)
Monocytes Relative: 10 %
Neutro Abs: 4.7 10*3/uL (ref 1.7–7.7)
Neutrophils Relative %: 72 %
Platelets: 301 10*3/uL (ref 150–400)
RBC: 4.54 MIL/uL (ref 4.22–5.81)
RDW: 14.7 % (ref 11.5–15.5)
WBC: 6.6 10*3/uL (ref 4.0–10.5)
nRBC: 0 % (ref 0.0–0.2)

## 2020-02-07 LAB — COMPREHENSIVE METABOLIC PANEL
ALT: 38 U/L (ref 0–44)
AST: 30 U/L (ref 15–41)
Albumin: 3.4 g/dL — ABNORMAL LOW (ref 3.5–5.0)
Alkaline Phosphatase: 55 U/L (ref 38–126)
Anion gap: 9 (ref 5–15)
BUN: 32 mg/dL — ABNORMAL HIGH (ref 6–20)
CO2: 29 mmol/L (ref 22–32)
Calcium: 8.1 mg/dL — ABNORMAL LOW (ref 8.9–10.3)
Chloride: 101 mmol/L (ref 98–111)
Creatinine, Ser: 1.37 mg/dL — ABNORMAL HIGH (ref 0.61–1.24)
GFR, Estimated: >60 mL/min (ref >60)
Glucose, Bld: 425 mg/dL — ABNORMAL HIGH (ref 70–99)
Potassium: 4.7 mmol/L (ref 3.5–5.1)
Sodium: 139 mmol/L (ref 135–145)
Total Bilirubin: 0.7 mg/dL (ref 0.3–1.2)
Total Protein: 7.2 g/dL (ref 6.5–8.1)

## 2020-02-07 LAB — GLUCOSE, CAPILLARY
Glucose-Capillary: 361 mg/dL — ABNORMAL HIGH (ref 70–99)
Glucose-Capillary: 478 mg/dL — ABNORMAL HIGH (ref 70–99)
Glucose-Capillary: 494 mg/dL — ABNORMAL HIGH (ref 70–99)
Glucose-Capillary: 533 mg/dL (ref 70–99)

## 2020-02-07 LAB — PHOSPHORUS: Phosphorus: 5 mg/dL — ABNORMAL HIGH (ref 2.5–4.6)

## 2020-02-07 LAB — FERRITIN: Ferritin: 496 ng/mL — ABNORMAL HIGH (ref 24–336)

## 2020-02-07 LAB — D-DIMER, QUANTITATIVE: D-Dimer, Quant: <0.27 ug/mL-FEU (ref 0.00–0.50)

## 2020-02-07 LAB — MAGNESIUM: Magnesium: 1.9 mg/dL (ref 1.7–2.4)

## 2020-02-07 LAB — LACTATE DEHYDROGENASE: LDH: 183 U/L (ref 98–192)

## 2020-02-07 LAB — C-REACTIVE PROTEIN: CRP: 7.9 mg/dL — ABNORMAL HIGH (ref <1.0)

## 2020-02-07 MED ORDER — INSULIN ASPART 100 UNIT/ML ~~LOC~~ SOLN
0.0000 [IU] | Freq: Three times a day (TID) | SUBCUTANEOUS | Status: DC
  Administered 2020-02-07: 17:00:00 20 [IU] via SUBCUTANEOUS
  Administered 2020-02-08 (×2): 15 [IU] via SUBCUTANEOUS
  Administered 2020-02-08 – 2020-02-11 (×9): 20 [IU] via SUBCUTANEOUS
  Filled 2020-02-07 (×13): qty 1

## 2020-02-07 MED ORDER — INSULIN ASPART 100 UNIT/ML ~~LOC~~ SOLN
20.0000 [IU] | Freq: Once | SUBCUTANEOUS | Status: AC
  Administered 2020-02-07: 22:00:00 20 [IU] via SUBCUTANEOUS
  Filled 2020-02-07: qty 1

## 2020-02-07 MED ORDER — INSULIN ASPART 100 UNIT/ML ~~LOC~~ SOLN
10.0000 [IU] | Freq: Three times a day (TID) | SUBCUTANEOUS | Status: DC
  Administered 2020-02-07 – 2020-02-08 (×2): 10 [IU] via SUBCUTANEOUS
  Filled 2020-02-07 (×2): qty 1

## 2020-02-07 MED ORDER — INSULIN ASPART 100 UNIT/ML ~~LOC~~ SOLN
0.0000 [IU] | Freq: Every day | SUBCUTANEOUS | Status: DC
  Administered 2020-02-08 – 2020-02-09 (×2): 5 [IU] via SUBCUTANEOUS
  Filled 2020-02-07 (×2): qty 1

## 2020-02-07 MED ORDER — IPRATROPIUM-ALBUTEROL 20-100 MCG/ACT IN AERS
1.0000 | INHALATION_SPRAY | Freq: Four times a day (QID) | RESPIRATORY_TRACT | Status: DC
  Administered 2020-02-07 – 2020-02-23 (×61): 1 via RESPIRATORY_TRACT
  Filled 2020-02-07 (×3): qty 4

## 2020-02-07 MED ORDER — INSULIN DETEMIR 100 UNIT/ML ~~LOC~~ SOLN
45.0000 [IU] | Freq: Every day | SUBCUTANEOUS | Status: DC
  Administered 2020-02-07 – 2020-02-09 (×3): 45 [IU] via SUBCUTANEOUS
  Filled 2020-02-07 (×4): qty 0.45

## 2020-02-07 NOTE — Evaluation (Signed)
Physical Therapy Evaluation Patient Details Name: Raymond [name] MRN: [id number] DOB: 02-17-1965 Today's Date: 02/07/2020   History of Present Illness  Raymond [name] is a 19yoM who [facility] BIBA c SOB, COVID (+) contact. Pt febrile (mildly), COVIDtest (+). PMH: CHF on 2L baseline, OSA on triology, DM2, bilat PND c orthopedic shoes, bilat toe amputations.  Clinical Impression  Pt admitted with above diagnosis. Pt currently with functional limitations due to the deficits listed below (see "PT Problem List"). Upon entry, pt in bed, awake and agreeable to participate. The pt is alert, pleasant, interactive, and able to provide info regarding prior level of function, both in tolerance and independence. Patient's performance this date reveals slight decreased independence and tolerance in performing basic mobility required for performance of activities of daily living. Pt requires higher than baseline flowrate. Pt requires close physical assistance, and cues for safe participate in mobility, however is fairly close to his baseline in balance and strength. Pt will benefit from skilled PT intervention to increase independence and safety with basic mobility in preparation for discharge to the venue listed below.       Follow Up Recommendations No PT follow up    Equipment Recommendations  None recommended by PT    Recommendations for Other Services       Precautions / Restrictions Precautions Precautions: Fall Precaution Comments: orthopedic shoes in room Restrictions Weight Bearing Restrictions: No      Mobility  Bed Mobility Overal bed mobility: Modified Independent             General bed mobility comments: Pt seated in recliner chair    Transfers Overall transfer level: Needs assistance Equipment used: None Transfers: Sit to/from Stand;Stand Pivot Transfers Sit to Stand: Supervision Stand pivot transfers: Supervision       General transfer  comment: inititally a bit unsteady, then no balance issues. Pt is careful and selfaware.  Ambulation/Gait Ambulation/Gait assistance: Supervision Gait Distance (Feet): 15 Feet Assistive device: None Gait Pattern/deviations: WFL(Within Functional Limits)     General Gait Details: performed some side stepping at bedside, then 4-square pattern. No LOB. On 6L/min, 93% SpO2.  Stairs            Wheelchair Mobility    Modified Rankin (Stroke Patients Only)       Balance Overall balance assessment: Modified Independent;Mild deficits observed, not formally tested Sitting-balance support: Feet supported Sitting balance-Leahy Scale: Good     Standing balance support: During functional activity Standing balance-Leahy Scale: Fair                               Pertinent Vitals/Pain Pain Assessment: No/denies pain    Home Living Family/patient expects to be discharged to:: Private residence Living Arrangements: Non-relatives/Friends   Type of Home: Apartment Home Access: Stairs to enter   [employer] of Steps: 5 steps, no rails, no difficulty at baseline Home Layout: One level Home Equipment: None;Grab bars - tub/shower Additional Comments: no DME at homne, no falls, no balance issues    Prior Function Level of Independence: Independent         Comments: Pt does not typically take his O2 out in the community.     Hand Dominance   Dominant Hand: Right    Extremity/Trunk Assessment   Upper Extremity Assessment Upper Extremity Assessment: Generalized weakness;Overall [facility] for tasks assessed    Lower Extremity Assessment Lower Extremity Assessment: Generalized weakness;Overall [facility] for  tasks assessed    Cervical / Trunk Assessment Cervical / Trunk Assessment: Normal  Communication   Communication: No difficulties  Cognition Arousal/Alertness: Awake/alert Behavior During Therapy: WFL for tasks assessed/performed Overall Cognitive  Status: Within Functional Limits for tasks assessed                                        General Comments      Exercises     Assessment/Plan    PT Assessment Patient needs continued PT services  PT Problem List Decreased activity tolerance;Decreased balance;Decreased mobility       PT Treatment Interventions DME instruction;Balance training;Gait training;Functional mobility training;Stair training;Therapeutic activities;Therapeutic exercise;Patient/family education    PT Goals (Current goals can be found in the Care Plan section)  Acute Rehab PT Goals Patient Stated Goal: return to home soon PT Goal Formulation: With patient Time For Goal Achievement: 02/21/20 Potential to Achieve Goals: Good    Frequency Min 2X/week   Barriers to discharge        Co-evaluation               AM-PAC PT "6 Clicks" Mobility  Outcome Measure Help needed turning from your back to your side while in a flat bed without using bedrails?: None Help needed moving from lying on your back to sitting on the side of a flat bed without using bedrails?: None Help needed moving to and from a bed to a chair (including a wheelchair)?: None Help needed standing up from a chair using your arms (e.g., wheelchair or bedside chair)?: None Help needed to walk in hospital room?: A Little Help needed climbing 3-5 steps with a railing? : A Little 6 Click Score: 22    End of Session Equipment Utilized During Treatment: Oxygen Activity Tolerance: Patient tolerated treatment well;No increased pain Patient left: in chair;with call bell/phone within reach Nurse Communication: Mobility status PT Visit Diagnosis: Unsteadiness on feet (R26.81);Difficulty in walking, not elsewhere classified (R26.2);Other abnormalities of gait and mobility (R26.89)    Time: 1610-9604 PT Time Calculation (min) (ACUTE ONLY): 29 min   Charges:   PT Evaluation $PT Eval Moderate Complexity: 1 Mod PT  Treatments $Neuromuscular Re-education: 8-22 mins        4:55 PM, 02/07/20 Etta Grandchild, PT, DPT Physical Therapist - [facility]  [phone] ([place])    [place] 02/07/2020, 4:52 PM

## 2020-02-07 NOTE — Progress Notes (Addendum)
Inpatient Diabetes Program Recommendations  AACE/ADA: New Consensus Statement on Inpatient Glycemic Control (2015)  Target Ranges:  Prepandial:   less than 140 mg/dL      Peak postprandial:   less than 180 mg/dL (1-2 hours)      Critically ill patients:  140 - 180 mg/dL   Lab Results  Component Value Date   GLUCAP 478 (H) 02/07/2020   HGBA1C 7.8 (H) 02/05/2020    Review of Glycemic Control Results for Raymond [name], Raymond [name] (MRN [id number]) as of 02/07/2020 12:54  Ref. Range 02/06/2020 12:00 02/06/2020 17:24 02/06/2020 22:40 02/07/2020 07:56 02/07/2020 11:39  Glucose-Capillary Latest Ref Range: 70 - 99 mg/dL 340 (H) 410 (H) 451 (H) 361 (H) 478 (H)  Diabetes history: DM 2 Outpatient Diabetes medications:  Levemir 89 units bid, Humalog 60 units tid with meals, Metformin 1000 mg bid Current orders for Inpatient glycemic control:  Solumedrol 40 mg IV q 12 hours Novolog resistant tid with meals and HS Lantus 20 units bid  Inpatient Diabetes Program Recommendations:    Please consider d/c of Lantus and add Levemir 45 units bid.  Also consider adding Novolog 10 units tid with meals.  Patient was on Large doses of Humalog and Levemir prior to admit.    Thanks,  Adah Perl, RN, BC-ADM Inpatient Diabetes Coordinator Pager [phone] (8a-5p)  [phone]- Verbal orders received from Dr. Sherral Hammers.

## 2020-02-07 NOTE — Care Management Important Message (Signed)
Important Message  Patient Details  Name: Raymond [name] MRN: [id number] Date of Birth: July 11, 1965   Medicare Important Message Given:  Yes     Shelbie Hutching, RN 02/07/2020, 2:48 PM

## 2020-02-07 NOTE — Progress Notes (Signed)
PROGRESS NOTE    Raymond [name]  OZD:[id number] DOB: Oct 08, 1965 DOA: 02/05/2020 PCP: [name], NP     Brief Narrative:  55 y.o. BM PMHx morbid obesity, DM type II controlled with hyperglycemia, DM neuropathy, OSA/OHS on trilogy, chronic respiratory failure with hypoxia on 2 L O2 at home, chronic diastolic CHF, PVD,   Exposed to family member with covid 2 days ago. Yesterday developed cough and shortness of breath. Also subjective fever. No chest pain, no pleuritic pain. No n/v/d. No abdominal pain. No dysuria. Has had 2 vaccines but no booster. Required 3 L at home, up from baseline of 2.   ED Course:   Labs, cxr, steroids, remdesivir.   Subjective: A/O x4, very pleasant   Assessment & Plan: Covid vaccination; vaccinated 2/3   Principal Problem:   Pneumonia due to COVID-19 virus Active Problems:   OSA treated with Trilogy machine   Hypoxemia requiring supplemental oxygen   Morbid obesity ([place])   Chronic diastolic heart failure ([place])   HTN (hypertension)   Diabetes ([place])   Acute hypoxic respiratory failure due to SARS COVID-19 viral pneumonia. COVID-19 Labs  Recent Labs    02/06/20 0435 02/07/20 0403 02/07/20 1308  FERRITIN 617* 496*  --   LDH  --   --  183  CRP 12.9* 7.9*  --     1/5 POC SARS coronavirus positive  - Remdesivir per pharmacy -Solu-Medrol 40 mg BID - Combivent QID - Flutter valve - Incentive spirometry - Titrate O2 to maintain SPO2> 25%  Chronic Diastolic CHF - Strict in and out - Daily weight - Amlodipine 10 mg daily - Losartan 50 mg daily  Essential HTN - See CHF  DM type II uncontrolled with hyperglycemia - 1/5 hemoglobin A1c= 7.8 -Levemir 45 units daily - NovoLog 10 units qac - Resistant SSI - 1/7 patient seen by diabetic coordinator - 1/7 consult to nutrition; uncontrolled diabetic morbidly obese requires counseling for diabetic control and weight loss  Obesity class III - See diabetes  OSA - On  trilogy at home, will use CPAP while in hospital per respiratory protocol   Patient continue to be at high risk for worsening respiratory failure   Status is: Inpatient  Remains inpatient appropriate because:IV treatments appropriate due to intensity of illness or inability to take PO    DVT prophylaxis: Lovenox Code Status: Full Family Communication:  Status is: Inpatient    Dispo: The patient is from: Home              Anticipated d/c is to: Home              Anticipated d/c date is: 1/11              Patient currently unstable      Consultants:    Procedures/Significant Events:    I have personally reviewed and interpreted all radiology studies and my findings are as above.  VENTILATOR SETTINGS: Nasal cannula 1/7 Flow 4 L/min SPO2 100%   Cultures 1/5 POC SARS coronavirus positive  Antimicrobials: Anti-infectives (From admission, onward)   Start     Ordered Stop   02/06/20 1000  remdesivir 100 mg in sodium chloride 0.9 % 100 mL IVPB       "Followed by" Linked Group Details   02/05/20 1141 02/10/20 0959   02/05/20 1245  remdesivir 200 mg in sodium chloride 0.9% 250 mL IVPB       "Followed by" Linked Group Details   02/05/20 1141 02/05/20  [place] / TUBES:      Continuous Infusions: . sodium chloride    . remdesivir 100 mg in NS 100 mL 100 mg (02/07/20 0843)     Objective: Vitals:   02/06/20 2238 02/07/20 0530 02/07/20 0754 02/07/20 1138  BP: (!) 141/65 129/63 (!) 123/55 (!) 153/76  Pulse: 72 61 63 62  Resp: 18 18 16 17   Temp: 99.7 F (37.6 C) 99.1 F (37.3 C) 98 F (36.7 C) 98.4 F (36.9 C)  TempSrc: Oral Oral    SpO2: 93% 96% 95% 100%  Weight: (!) 178.8 kg     Height: 6' (1.829 m)       Intake/Output Summary (Last 24 hours) at 02/07/2020 1153 Last data filed at 02/07/2020 0700 Gross per 24 hour  Intake 95.73 ml  Output 3250 ml  Net -3154.27 ml   Filed Weights   02/05/20 1031 02/06/20 2238  Weight:  (!) 170 kg (!) 178.8 kg    Examination:  General: A/O x4, positive acute respiratory distress Eyes: negative scleral hemorrhage, negative anisocoria, negative icterus ENT: Negative Runny nose, negative gingival bleeding, Neck:  Negative scars, masses, torticollis, lymphadenopathy, JVD Lungs: decreased breath sounds bilaterally without wheezes or crackles Cardiovascular: Regular rate and rhythm without murmur gallop or rub normal S1 and S2 Abdomen: MORBIDLY OBESE, negative abdominal pain, nondistended, positive soft, bowel sounds, no rebound, no ascites, no appreciable mass Extremities: No significant cyanosis, clubbing, or edema bilateral lower extremities Skin: Negative rashes, lesions, ulcers Psychiatric:  Negative depression, negative anxiety, negative fatigue, negative mania  Central nervous system:  Cranial nerves II through XII intact, tongue/uvula midline, all extremities muscle strength 5/5, sensation intact throughout, negative dysarthria, negative expressive aphasia, negative receptive aphasia.  .     Data Reviewed: Care during the described time interval was provided by me .  I have reviewed this patient's available data, including medical history, events of note, physical examination, and all test results as part of my evaluation.  CBC: Recent Labs  Lab 02/05/20 1049 02/06/20 0435  WBC 6.3 6.2  NEUTROABS 3.5 3.8  HGB 10.2* 11.4*  HCT 32.7* 36.7*  MCV 83.4 83.0  PLT 220 427   Basic Metabolic Panel: Recent Labs  Lab 02/05/20 1049 02/05/20 1526 02/06/20 0435 02/07/20 0403  NA 137  --  138 139  K 3.3*  --  4.5 4.7  CL 98  --  99 101  CO2 27  --  28 29  GLUCOSE 342*  --  455* 425*  BUN 17  --  24* 32*  CREATININE 1.32*  --  1.36* 1.37*  CALCIUM 8.0*  --  8.4* 8.1*  MG  --  1.6*  --   --   PHOS  --   --  3.9  --    GFR: Estimated Creatinine Clearance: 100.6 mL/min (A) (by C-G formula based on SCr of 1.37 mg/dL (H)). Liver Function Tests: Recent Labs  Lab  02/05/20 1049 02/06/20 0435 02/07/20 0403  AST 40 37 30  ALT 36 41 38  ALKPHOS 57 59 55  BILITOT 1.1 0.8 0.7  PROT 6.6 7.2 7.2  ALBUMIN 3.3* 3.4* 3.4*   No results for input(s): LIPASE, AMYLASE in the last 168 hours. No results for input(s): AMMONIA in the last 168 hours. Coagulation Profile: No results for input(s): INR, PROTIME in the last 168 hours. Cardiac Enzymes: No results for input(s): CKTOTAL, CKMB, CKMBINDEX, TROPONINI in the last 168  hours. BNP (last 3 results) No results for input(s): PROBNP in the last 8760 hours. HbA1C: Recent Labs    02/05/20 1526  HGBA1C 7.8*   CBG: Recent Labs  Lab 02/06/20 1200 02/06/20 1724 02/06/20 2240 02/07/20 0756 02/07/20 1139  GLUCAP 340* 410* 451* 361* 478*   Lipid Profile: No results for input(s): CHOL, HDL, LDLCALC, TRIG, CHOLHDL, LDLDIRECT in the last 72 hours. Thyroid Function Tests: No results for input(s): TSH, T4TOTAL, FREET4, T3FREE, THYROIDAB in the last 72 hours. Anemia Panel: Recent Labs    02/06/20 0435  FERRITIN 617*   Sepsis Labs: Recent Labs  Lab 02/05/20 1049  PROCALCITON 0.15    No results found for this or any previous visit (from the past 240 hour(s)).       Radiology Studies: No results found.      Scheduled Meds: . amLODipine  10 mg Oral Daily  . aspirin EC  81 mg Oral Daily  . enoxaparin (LOVENOX) injection  85 mg Subcutaneous Q24H  . gabapentin  300 mg Oral TID  . insulin aspart  0-20 Units Subcutaneous TID WC  . insulin aspart  0-5 Units Subcutaneous QHS  . insulin glargine  20 Units Subcutaneous BID  . losartan  50 mg Oral Daily  . methylPREDNISolone (SOLU-MEDROL) injection  40 mg Intravenous Q12H  . pantoprazole  40 mg Oral Daily  . sodium chloride flush  3 mL Intravenous Q12H  . torsemide  40 mg Oral Daily   Continuous Infusions: . sodium chloride    . remdesivir 100 mg in NS 100 mL 100 mg (02/07/20 0843)     LOS: 2 days    Time spent:40 min    Traves Majchrzak, Geraldo Docker, MD [facility] Pager [phone]  If 7PM-7AM, please contact night-coverage [email or web address] Password [facility] 02/07/2020, 11:53 AM

## 2020-02-07 NOTE — Evaluation (Signed)
Occupational Therapy Evaluation Patient Details Name: Raymond [name] MRN: [id number] DOB: 04-15-1965 Today's Date: 02/07/2020    History of Present Illness 55 y.o. male with medical history significant for morbid obesity, t2dm, osa on trilogy, chronic hypoxic respiratory failure on 2 L at home, [facility], who presents with the above.     Exposed to family member with covid 2 days ago.   Clinical Impression   Patient presenting with decreased I in self care, balance, functional mobility/transfer, endurance, and safety awareness. Patient reports living at home with a cousin PTA. Pt reports his cousin is also admitted to hospital with [place]. Pt reports being fully independent without use of AD but does use 2 L O2 at baseline. Pt on 6 L O2 this session and able to be turned down to 4L during functional tasks. Patient currently functioning at close supervision without use of AD for ambulating within the room, toileting needs, and standing at sink for grooming tasks. Patient will benefit from acute OT to increase overall independence in the areas of ADLs, functional mobility, and safety awareness in order to safely discharge home.    Follow Up Recommendations  Home health OT;Supervision - Intermittent    Equipment Recommendations  None recommended by OT       Precautions / Restrictions Precautions Precautions: Fall      Mobility Bed Mobility               General bed mobility comments: Pt seated in recliner chair    Transfers Overall transfer level: Needs assistance Equipment used: None Transfers: Sit to/from Stand;Stand Pivot Transfers Sit to Stand: Supervision Stand pivot transfers: Supervision       General transfer comment: close supervision with min cuing for safety awareness    Balance Overall balance assessment: Needs assistance Sitting-balance support: Feet supported Sitting balance-Leahy Scale: Good     Standing balance support: During functional  activity Standing balance-Leahy Scale: Fair                             ADL either performed or assessed with clinical judgement   ADL Overall ADL's : Needs assistance/impaired     Grooming: Wash/dry hands;Wash/dry face;Standing;Supervision/safety                   Toilet Transfer: Supervision/safety;Regular Toilet;Grab bars   Toileting- Water quality scientist and Hygiene: Supervision/safety;Sit to/from stand               Vision Baseline Vision/History: No visual deficits Patient Visual Report: No change from baseline Vision Assessment?: No apparent visual deficits            Pertinent Vitals/Pain Pain Assessment: No/denies pain     Hand Dominance Right   Extremity/Trunk Assessment Upper Extremity Assessment Upper Extremity Assessment: Overall WFL for tasks assessed   Lower Extremity Assessment Lower Extremity Assessment: Overall WFL for tasks assessed   Cervical / Trunk Assessment Cervical / Trunk Assessment: Normal   Communication Communication Communication: No difficulties   Cognition Arousal/Alertness: Awake/alert Behavior During Therapy: WFL for tasks assessed/performed Overall Cognitive Status: Within Functional Limits for tasks assessed                             Home Living Family/patient expects to be discharged to:: Private residence Living Arrangements: Non-relatives/Friends (cousin/roommate)   Type of Home: Apartment Home Access: Stairs to enter [employer] of Steps: 5 steps, no  rails, no difficulty at baseline   Home Layout: One level     Bathroom Shower/Tub: Tub/shower unit         Home Equipment: None;Grab bars - tub/shower   Additional Comments: no DME at homne, no falls, no balance issues      Prior Functioning/Environment Level of Independence: Independent        Comments: Pt does not typically take his O2 out in the community.        OT Problem List: Decreased  strength;Decreased activity tolerance;Decreased safety awareness;Impaired balance (sitting and/or standing);Decreased knowledge of use of DME or AE;Decreased knowledge of precautions;Cardiopulmonary status limiting activity      OT Treatment/Interventions: Self-care/ADL training;Balance training;Therapeutic exercise;Therapeutic activities;Energy conservation;DME and/or AE instruction;Patient/family education    OT Goals(Current goals can be found in the care plan section) Acute Rehab OT Goals Patient Stated Goal: to get better OT Goal Formulation: With patient Time For Goal Achievement: 02/21/20 Potential to Achieve Goals: Good ADL Goals Pt Will Perform Grooming: Independently;standing Pt Will Perform Lower Body Dressing: Independently;sit to/from stand Pt Will Transfer to Toilet: Independently;ambulating Pt Will Perform Toileting - Clothing Manipulation and hygiene: Independently;sit to/from stand  OT Frequency: Min 2X/week   Barriers to D/C:    none known at this time          AM-PAC OT "6 Clicks" Daily Activity     Outcome Measure Help from another person eating meals?: None Help from another person taking care of personal grooming?: A Little Help from another person toileting, which includes using toliet, bedpan, or urinal?: A Little Help from another person bathing (including washing, rinsing, drying)?: A Little Help from another person to put on and taking off regular upper body clothing?: None Help from another person to put on and taking off regular lower body clothing?: A Little 6 Click Score: 20   End of Session Equipment Utilized During Treatment: Rolling walker Nurse Communication: Mobility status  Activity Tolerance: Patient tolerated treatment well Patient left: in chair;with call bell/phone within reach  OT Visit Diagnosis: Muscle weakness (generalized) (M62.81)                Time: 4268-3419 OT Time Calculation (min): 31 min Charges:  OT General Charges $OT  Visit: 1 Visit OT Evaluation $OT Eval Low Complexity: 1 Low OT Treatments $Self Care/Home Management : 23-37 mins  Darleen Crocker, MS, OTR/L , CBIS ascom [phone]  02/07/20, 1:42 PM

## 2020-02-07 NOTE — TOC Initial Note (Signed)
Transition of Care [facility]) - Initial/Assessment Note    Patient Details  Name: Raymond [name] MRN: [id number] Date of Birth: December 13, 1965  Transition of Care [facility]) CM/SW Contact:    Shelbie Hutching, RN Phone Number: 02/07/2020, 2:48 PM  Clinical Narrative:                 Patient admitted to the hospital with COVID initially requiring supplemental oxygen at 6L.  Patient is on chronic O2 at 2L through [place].  Patient also reports that he has a trilogy at home.  Patient is current with his PCP at [facility] and gets his prescriptions from there.  Patient lives in an apartment in [place] with a roommate that is also in the hospital right now with [place].  Patient is independent and can drive.  Patient does agree with home health at discharge.  [facility]ne Cornea with [facility] has agreed to accept patient for nursing at discharge.    Expected Discharge Plan: [place] Barriers to Discharge: Continued Medical Work up   Patient Goals and CMS Choice Patient states their goals for this hospitalization and ongoing recovery are:: Ready to get back home CMS Medicare.gov Compare Post Acute Care list provided to:: Patient Choice offered to / list presented to : Patient  Expected Discharge Plan and Services Expected Discharge Plan: [place]   Discharge Planning Services: CM Consult Post Acute Care Choice: [place] arrangements for the past 2 months: Apartment                 DME Arranged: N/A DME Agency: NA       HH Arranged: RN [place] Agency: [place] ([place]) Date [place]: 02/07/20 Time [place]: [place] Representative spoke with at [place]: Corene Cornea  Prior Living Arrangements/Services Living arrangements for the past 2 months: [place] with:: Roommate Patient language and need for interpreter reviewed:: Yes Do you feel safe going back to the place where you live?: Yes      Need for Family  Participation in Patient Care: Yes (Comment) Care giver support system in place?: Yes (comment) Current home services: DME (Trilogy and oxygen) Criminal Activity/Legal Involvement Pertinent to Current Situation/Hospitalization: No - Comment as needed  Activities of Daily Living Home Assistive Devices/Equipment: CPAP (Trilogy) ADL Screening (condition at time of admission) Patient's cognitive ability adequate to safely complete daily activities?: Yes Is the patient deaf or have difficulty hearing?: No Does the patient have difficulty seeing, even when wearing glasses/contacts?: No Does the patient have difficulty concentrating, remembering, or making decisions?: No Patient able to express need for assistance with ADLs?: Yes Does the patient have difficulty dressing or bathing?: No Independently performs ADLs?: Yes (appropriate for developmental age) Does the patient have difficulty walking or climbing stairs?: Yes Weakness of Legs: Both Weakness of Arms/Hands: None  Permission Sought/Granted Permission sought to share information with : Other (comment),Case Manager Permission granted to share information with : Yes, Verbal Permission Granted     Permission granted to share info w AGENCY: [facility]        Emotional Assessment Appearance:: Appears stated age Attitude/Demeanor/Rapport: Engaged Affect (typically observed): Accepting Orientation: : Oriented to Self,Oriented to Place,Oriented to  Time,Oriented to Situation Alcohol / Substance Use: Not Applicable Psych Involvement: No (comment)  Admission diagnosis:  Elevated troponin [R77.8] Acute hypoxemic respiratory failure due to COVID-19 ([place]) [U07.1, J96.01] Pneumonia due to COVID-19 virus [U07.1, J12.82] Patient Active Problem List  Diagnosis Date Noted  . Pneumonia due to COVID-19 virus 02/05/2020  . Acute on chronic respiratory failure with hypoxia ([place]) 04/28/2017  . Acute kidney injury ([place]) 04/28/2017  .  Hypotension 04/28/2017  . PVD (peripheral vascular disease) ([place]) 04/28/2017  . Hypertension 04/28/2017  . Diabetes mellitus type 2, insulin dependent ([place]) 04/28/2017  . OSA on CPAP 04/28/2017  . Chronic diastolic heart failure, NYHA class 1 ([place]) 04/28/2017  . Chest pain with moderate risk for cardiac etiology 04/28/2017  . Morbid obesity with BMI of 45.0-49.9, adult ([name]) 04/28/2017  . Lymphedema 02/11/2017  . HTN (hypertension) 02/01/2017  . Diabetes ([place]) 02/01/2017  . Pneumonia 01/13/2017  . Cellulitis of right foot 09/30/2014  . COPD, moderate ([place]) 07/07/2014  . Chronic diastolic heart failure ([place]) 05/06/2014  . OSA treated with Trilogy machine 04/28/2014  . Hypoxemia requiring supplemental oxygen 04/28/2014  . Morbid obesity ([place]) 04/28/2014  . Hyperlipidemia 07/09/2013   PCP:  [name], NP Pharmacy:   [name] [address] [place] [address] Phone: [phone] Fax: [phone]     Social Determinants of Health (SDOH) Interventions    Readmission Risk Interventions No flowsheet data found.

## 2020-02-08 DIAGNOSIS — U071 COVID-19: Secondary | ICD-10-CM | POA: Diagnosis not present

## 2020-02-08 DIAGNOSIS — I5032 Chronic diastolic (congestive) heart failure: Secondary | ICD-10-CM | POA: Diagnosis not present

## 2020-02-08 DIAGNOSIS — E1165 Type 2 diabetes mellitus with hyperglycemia: Secondary | ICD-10-CM | POA: Diagnosis present

## 2020-02-08 DIAGNOSIS — Z6841 Body Mass Index (BMI) 40.0 and over, adult: Secondary | ICD-10-CM

## 2020-02-08 DIAGNOSIS — I10 Essential (primary) hypertension: Secondary | ICD-10-CM | POA: Diagnosis not present

## 2020-02-08 LAB — FERRITIN: Ferritin: 468 ng/mL — ABNORMAL HIGH (ref 24–336)

## 2020-02-08 LAB — COMPREHENSIVE METABOLIC PANEL
ALT: 34 U/L (ref 0–44)
AST: 23 U/L (ref 15–41)
Albumin: 3.6 g/dL (ref 3.5–5.0)
Alkaline Phosphatase: 66 U/L (ref 38–126)
Anion gap: 11 (ref 5–15)
BUN: 27 mg/dL — ABNORMAL HIGH (ref 6–20)
CO2: 31 mmol/L (ref 22–32)
Calcium: 9 mg/dL (ref 8.9–10.3)
Chloride: 96 mmol/L — ABNORMAL LOW (ref 98–111)
Creatinine, Ser: 1.08 mg/dL (ref 0.61–1.24)
GFR, Estimated: >60 mL/min (ref >60)
Glucose, Bld: 345 mg/dL — ABNORMAL HIGH (ref 70–99)
Potassium: 4.1 mmol/L (ref 3.5–5.1)
Sodium: 138 mmol/L (ref 135–145)
Total Bilirubin: 0.7 mg/dL (ref 0.3–1.2)
Total Protein: 7.6 g/dL (ref 6.5–8.1)

## 2020-02-08 LAB — CBC WITH DIFFERENTIAL/PLATELET
Abs Immature Granulocytes: 0.06 10*3/uL (ref 0.00–0.07)
Basophils Absolute: 0 10*3/uL (ref 0.0–0.1)
Basophils Relative: 0 %
Eosinophils Absolute: 0 10*3/uL (ref 0.0–0.5)
Eosinophils Relative: 0 %
HCT: 39.2 % (ref 39.0–52.0)
Hemoglobin: 12.5 g/dL — ABNORMAL LOW (ref 13.0–17.0)
Immature Granulocytes: 1 %
Lymphocytes Relative: 11 %
Lymphs Abs: 1.2 10*3/uL (ref 0.7–4.0)
MCH: 26.1 pg (ref 26.0–34.0)
MCHC: 31.9 g/dL (ref 30.0–36.0)
MCV: 81.8 fL (ref 80.0–100.0)
Monocytes Absolute: 0.7 10*3/uL (ref 0.1–1.0)
Monocytes Relative: 7 %
Neutro Abs: 8.4 10*3/uL — ABNORMAL HIGH (ref 1.7–7.7)
Neutrophils Relative %: 81 %
Platelets: 308 10*3/uL (ref 150–400)
RBC: 4.79 MIL/uL (ref 4.22–5.81)
RDW: 14.4 % (ref 11.5–15.5)
WBC: 10.3 10*3/uL (ref 4.0–10.5)
nRBC: 0 % (ref 0.0–0.2)

## 2020-02-08 LAB — GLUCOSE, CAPILLARY
Glucose-Capillary: 297 mg/dL — ABNORMAL HIGH (ref 70–99)
Glucose-Capillary: 342 mg/dL — ABNORMAL HIGH (ref 70–99)
Glucose-Capillary: 349 mg/dL — ABNORMAL HIGH (ref 70–99)
Glucose-Capillary: 358 mg/dL — ABNORMAL HIGH (ref 70–99)
Glucose-Capillary: 364 mg/dL — ABNORMAL HIGH (ref 70–99)
Glucose-Capillary: 452 mg/dL — ABNORMAL HIGH (ref 70–99)

## 2020-02-08 LAB — PHOSPHORUS: Phosphorus: 3.4 mg/dL (ref 2.5–4.6)

## 2020-02-08 LAB — LACTATE DEHYDROGENASE: LDH: 196 U/L — ABNORMAL HIGH (ref 98–192)

## 2020-02-08 LAB — C-REACTIVE PROTEIN: CRP: 7.1 mg/dL — ABNORMAL HIGH (ref <1.0)

## 2020-02-08 LAB — MAGNESIUM: Magnesium: 2 mg/dL (ref 1.7–2.4)

## 2020-02-08 LAB — D-DIMER, QUANTITATIVE: D-Dimer, Quant: 0.28 ug/mL-FEU (ref 0.00–0.50)

## 2020-02-08 MED ORDER — INSULIN DETEMIR 100 UNIT/ML ~~LOC~~ SOLN
8.0000 [IU] | Freq: Every day | SUBCUTANEOUS | Status: DC
  Administered 2020-02-08: 8 [IU] via SUBCUTANEOUS
  Filled 2020-02-08 (×2): qty 0.08

## 2020-02-08 MED ORDER — PROSOURCE PLUS PO LIQD
30.0000 mL | Freq: Two times a day (BID) | ORAL | Status: DC
  Administered 2020-02-11 – 2020-02-14 (×3): 30 mL via ORAL
  Filled 2020-02-08 (×12): qty 30

## 2020-02-08 MED ORDER — INSULIN ASPART 100 UNIT/ML ~~LOC~~ SOLN
16.0000 [IU] | Freq: Three times a day (TID) | SUBCUTANEOUS | Status: DC
  Administered 2020-02-08 – 2020-02-09 (×3): 16 [IU] via SUBCUTANEOUS
  Filled 2020-02-08 (×3): qty 1

## 2020-02-08 MED ORDER — ADULT MULTIVITAMIN W/MINERALS CH
1.0000 | ORAL_TABLET | Freq: Every day | ORAL | Status: DC
  Administered 2020-02-09 – 2020-02-22 (×14): 1 via ORAL
  Filled 2020-02-08 (×14): qty 1

## 2020-02-08 MED ORDER — ENSURE MAX PROTEIN PO LIQD
11.0000 [oz_av] | Freq: Two times a day (BID) | ORAL | Status: DC
  Administered 2020-02-08 – 2020-02-11 (×6): 11 [oz_av] via ORAL
  Filled 2020-02-08: qty 330

## 2020-02-08 NOTE — Progress Notes (Signed)
Initial Nutrition Assessment  DOCUMENTATION CODES:   Morbid obesity  INTERVENTION:  Ensure Max po BID, each supplement provides 150 kcal and 30 grams of protein  ProSource Plus 30 ml po BID, each supplement provides 100 kcal and 15 grams of protein  MVI with minerals daily  Heart Healthy Consistent Carbohydrate Nutrition Therapy handout attached to discharge instructions  If pt agreeable, recommend outpatient referral to outpatient bariatric service and/or [facility].   NUTRITION DIAGNOSIS:   Increased nutrient needs related to catabolic illness (pneumonia secondary to COVID-19 virus infection) as evidenced by estimated needs.    GOAL:   Patient will meet greater than or equal to 90% of their needs    MONITOR:   Labs,I & O's,Supplement acceptance,PO intake,Weight trends  REASON FOR ASSESSMENT:   Consult Diet education,Assessment of nutrition requirement/status (uncontrolled diabetic morbidly obese requires counseling for diabetic control and weight loss)  ASSESSMENT:  55 year old [name] admitted for pneumonia due to COVID-19 virus presented with cough and shortness of breath after being exposed to family member with Covid 2 days ago. Past medical history of morbid obesity, DM2, OSA on trilogy, chronic respiratory failure on 2 L home O2, and dCHF.  RD working remotely.  Attempted to contact pt via phone this afternoon, however he did not answer. Will plan to obtain nutrition history and provide  education at follow-up. RD has attached "Healthy Consistent Carbohydrate Nutrition Therapy" to discharge instructions for pt review. Patient is morbidly obese and would benefit from weight loss, however given current admission secondary to COVID-19 virus, do not feel counseling pt on weight loss is appropriate at this time.  Obesity is a complex, chronic medical condition that is optimally managed by a multidisciplinary care team. If patient  agreeable, consider outpatient referral to outpatient bariatric service and/or [facility].   No recent weight history for review, last weight prior to present admission - 169.6 kg (373.12 lb) in 03/20.   Patient has increased calorie/protein needs. Given morbid obesity, needs are based on adjusted body weight (120.06 kg). He is currently eating 100% of meals, meeting ~ 76% of minium estimated needs and is at risk for malnutrition. Will order Ensure Max and ProSource supplements as well as daily MVI.   Medications reviewed and include: Gabapentin, SSI, Levemir 45 units at bedtime, Levemir Raymond units daily, Methylprednisolone, Protonix, Demadex 40 mg daily, Remdesivir  Labs: CBGs 349,342,297,364,533 x 24 hrs, BUN 27 (H) A1c 7.Raymond (H) on 02/05/20  NUTRITION - FOCUSED PHYSICAL EXAM: Unable to complete at this time  Diet Order:   Diet Order            Diet Carb Modified Fluid consistency: Thin; Room service appropriate? Yes  Diet effective now                 EDUCATION NEEDS:   Education needs have been addressed  Skin:     Last BM:  1/Raymond -large  Height:   Ht Readings from Last 1 Encounters:  02/06/20 6' (1.829 m)    Weight:   Wt Readings from Last 1 Encounters:  02/06/20 (!) 178.Raymond kg    Ideal Body Weight:  80.9 kg  BMI:  Body mass index is 53.46 kg/m.  Estimated Nutritional Needs:   Kcal:  6160-7371 (22-24 g/kg/AdjBW)  Protein:  145-161 (1.Raymond-2 g/kg/IBW)  Fluid:  >2.6 L/day   Lajuan Lines, RD, LDN Clinical Nutrition After Hours/Weekend Pager # in [place]

## 2020-02-08 NOTE — Progress Notes (Signed)
PROGRESS NOTE    Raymond [name]  BJY:[id number] DOB: 07-09-1965 DOA: 02/05/2020 PCP: [name], NP     Brief Narrative:  55 y.o. BM PMHx morbid obesity, DM type II controlled with hyperglycemia, DM neuropathy, OSA/OHS on trilogy, chronic respiratory failure with hypoxia on 2 L O2 at home, chronic diastolic CHF, PVD,   Exposed to family member with covid 2 days ago. Yesterday developed cough and shortness of breath. Also subjective fever. No chest pain, no pleuritic pain. No n/v/d. No abdominal pain. No dysuria. Has had 2 vaccines but no booster. Required 3 L at home, up from baseline of 2.   ED Course:   Labs, cxr, steroids, remdesivir.   Subjective: 1/8 afebrile overnight A/O x4.  Some improvement in patient's breathing today.   Assessment & Plan: Covid vaccination; vaccinated 2/3   Principal Problem:   Pneumonia due to COVID-19 virus Active Problems:   OSA treated with Trilogy machine   Hypoxemia requiring supplemental oxygen   Morbid obesity (HCC)   Chronic diastolic heart failure (HCC)   HTN (hypertension)   Diabetes (HCC)   Chronic diastolic heart failure, NYHA class 1 ([place])   Uncontrolled type 2 diabetes mellitus with hyperglycemia ([place])   Morbid obesity with BMI of 50.0-59.9, adult ([place])   Acute hypoxic respiratory failure due to SARS COVID-19 viral pneumonia. COVID-19 Labs  Recent Labs    02/06/20 0435 02/07/20 0403 02/07/20 1308 02/08/20 0659  DDIMER  --  <0.27  --   --   FERRITIN 617* 496*  --  468*  LDH  --   --  183 196*  CRP 12.9* 7.9*  --  7.1*    1/5 POC SARS coronavirus positive  - Remdesivir per pharmacy -Solu-Medrol 40 mg BID - Combivent QID - Flutter valve - Incentive spirometry - Titrate O2 to maintain SPO2> 08%  Chronic Diastolic CHF - Strict in and out -6.9 L - Daily weight Filed Weights   02/05/20 1031 02/06/20 2238  Weight: (!) 170 kg (!) 178.8 kg  - Amlodipine 10 mg daily - Losartan 50 mg  daily  Essential HTN - See CHF  DM type II uncontrolled with hyperglycemia - 1/5 hemoglobin A1c= 7.8 -1/8 increased Levemir 45 units Levemir qhs ; 8 units Am - 1/8 increase NovoLog 16 units qac  - Resistant SSI - 1/7 patient seen by diabetic coordinator - 1/7 consult to nutrition; uncontrolled diabetic morbidly obese requires counseling for diabetic control and weight loss  Obesity class III - See diabetes  OSA - On trilogy at home, will use CPAP while in hospital per respiratory protocol   Patient continue to be at high risk for worsening respiratory failure   Status is: Inpatient  Remains inpatient appropriate because:IV treatments appropriate due to intensity of illness or inability to take PO    DVT prophylaxis: Lovenox Code Status: Full Family Communication:  Status is: Inpatient    Dispo: The patient is from: Home              Anticipated d/c is to: Home              Anticipated d/c date is: 1/11              Patient currently unstable      Consultants:    Procedures/Significant Events:    I have personally reviewed and interpreted all radiology studies and my findings are as above.  VENTILATOR SETTINGS: Nasal cannula 1/8 Flow 4 L/min SPO2 94%   Cultures  1/5 POC SARS coronavirus positive  Antimicrobials: Anti-infectives (From admission, onward)   Start     Ordered Stop   02/06/20 1000  remdesivir 100 mg in sodium chloride 0.9 % 100 mL IVPB       "Followed by" Linked Group Details   02/05/20 1141 02/10/20 0959   02/05/20 1245  remdesivir 200 mg in sodium chloride 0.9% 250 mL IVPB       "Followed by" Linked Group Details   02/05/20 1141 02/05/20 1248       Devices    LINES / TUBES:      Continuous Infusions: . sodium chloride    . remdesivir 100 mg in NS 100 mL 100 mg (02/08/20 0815)     Objective: Vitals:   02/08/20 0042 02/08/20 0604 02/08/20 0804 02/08/20 1142  BP: (!) 152/78 (!) 155/82 (!) 154/85 (!) 116/52   Pulse: 66 91 85 79  Resp: 18 20 16 18   Temp: 97.7 F (36.5 C) 98.8 F (37.1 C) 98.2 F (36.8 C) 99 F (37.2 C)  TempSrc: Oral  Oral Oral  SpO2: 92% (!) 87% 94% 91%  Weight:      Height:        Intake/Output Summary (Last 24 hours) at 02/08/2020 1550 Last data filed at 02/08/2020 1100 Gross per 24 hour  Intake 100 ml  Output 2000 ml  Net -1900 ml   Filed Weights   02/05/20 1031 02/06/20 2238  Weight: (!) 170 kg (!) 178.8 kg    Examination:  General: A/O x4, positive acute respiratory distress Eyes: negative scleral hemorrhage, negative anisocoria, negative icterus ENT: Negative Runny nose, negative gingival bleeding, Neck:  Negative scars, masses, torticollis, lymphadenopathy, JVD Lungs: decreased breath sounds bilaterally without wheezes or crackles Cardiovascular: Regular rate and rhythm without murmur gallop or rub normal S1 and S2 Abdomen: MORBIDLY OBESE, negative abdominal pain, nondistended, positive soft, bowel sounds, no rebound, no ascites, no appreciable mass Extremities: No significant cyanosis, clubbing, or edema bilateral lower extremities Skin: Negative rashes, lesions, ulcers Psychiatric:  Negative depression, negative anxiety, negative fatigue, negative mania  Central nervous system:  Cranial nerves II through XII intact, tongue/uvula midline, all extremities muscle strength 5/5, sensation intact throughout, negative dysarthria, negative expressive aphasia, negative receptive aphasia.  .     Data Reviewed: Care during the described time interval was provided by me .  I have reviewed this patient's available data, including medical history, events of note, physical examination, and all test results as part of my evaluation.  CBC: Recent Labs  Lab 02/05/20 1049 02/06/20 0435 02/07/20 1308 02/08/20 0659  WBC 6.3 6.2 6.6 10.3  NEUTROABS 3.5 3.8 4.7 8.4*  HGB 10.2* 11.4* 11.9* 12.5*  HCT 32.7* 36.7* 37.9* 39.2  MCV 83.4 83.0 83.5 81.8  PLT 220 249 301  [id number]   Basic Metabolic Panel: Recent Labs  Lab 02/05/20 1049 02/05/20 1526 02/06/20 0435 02/07/20 0403 02/07/20 1308 02/08/20 0659  NA 137  --  138 139  --  138  K 3.3*  --  4.5 4.7  --  4.1  CL 98  --  99 101  --  96*  CO2 27  --  28 29  --  31  GLUCOSE 342*  --  455* 425*  --  345*  BUN 17  --  24* 32*  --  27*  CREATININE 1.32*  --  1.36* 1.37*  --  1.08  CALCIUM 8.0*  --  8.4* 8.1*  --  9.0  MG  --  1.6*  --   --  1.9 2.0  PHOS  --   --  3.9  --  5.0* 3.4   GFR: Estimated Creatinine Clearance: 127.6 mL/min (by C-G formula based on SCr of 1.08 mg/dL). Liver Function Tests: Recent Labs  Lab 02/05/20 1049 02/06/20 0435 02/07/20 0403 02/08/20 0659  AST 40 37 30 23  ALT 36 41 38 34  ALKPHOS 57 59 55 66  BILITOT 1.1 0.8 0.7 0.7  PROT 6.6 7.2 7.2 7.6  ALBUMIN 3.3* 3.4* 3.4* 3.6   No results for input(s): LIPASE, AMYLASE in the last 168 hours. No results for input(s): AMMONIA in the last 168 hours. Coagulation Profile: No results for input(s): INR, PROTIME in the last 168 hours. Cardiac Enzymes: No results for input(s): CKTOTAL, CKMB, CKMBINDEX, TROPONINI in the last 168 hours. BNP (last 3 results) No results for input(s): PROBNP in the last 8760 hours. HbA1C: No results for input(s): HGBA1C in the last 72 hours. CBG: Recent Labs  Lab 02/07/20 1955 02/08/20 0043 02/08/20 0600 02/08/20 0820 02/08/20 1144  GLUCAP 533* 364* 297* 342* 349*   Lipid Profile: No results for input(s): CHOL, HDL, LDLCALC, TRIG, CHOLHDL, LDLDIRECT in the last 72 hours. Thyroid Function Tests: No results for input(s): TSH, T4TOTAL, FREET4, T3FREE, THYROIDAB in the last 72 hours. Anemia Panel: Recent Labs    02/07/20 0403 02/08/20 0659  FERRITIN 496* 468*   Sepsis Labs: Recent Labs  Lab 02/05/20 1049  PROCALCITON 0.15    No results found for this or any previous visit (from the past 240 hour(s)).       Radiology Studies: No results found.      Scheduled Meds: .  amLODipine  10 mg Oral Daily  . aspirin EC  81 mg Oral Daily  . enoxaparin (LOVENOX) injection  85 mg Subcutaneous Q24H  . gabapentin  300 mg Oral TID  . insulin aspart  0-20 Units Subcutaneous TID WC  . insulin aspart  0-5 Units Subcutaneous QHS  . insulin aspart  16 Units Subcutaneous TID WC  . insulin detemir  45 Units Subcutaneous QHS  . insulin detemir  8 Units Subcutaneous Daily  . Ipratropium-Albuterol  1 puff Inhalation QID  . losartan  50 mg Oral Daily  . methylPREDNISolone (SOLU-MEDROL) injection  40 mg Intravenous Q12H  . pantoprazole  40 mg Oral Daily  . sodium chloride flush  3 mL Intravenous Q12H  . torsemide  40 mg Oral Daily   Continuous Infusions: . sodium chloride    . remdesivir 100 mg in NS 100 mL 100 mg (02/08/20 0815)     LOS: 3 days    Time spent:40 min    Maxamus Colao, Raymond Docker, MD [facility] Pager [phone]  If 7PM-7AM, please contact night-coverage [email or web address] Password [email or web address] 02/08/2020, 3:50 PM

## 2020-02-08 NOTE — Discharge Instructions (Signed)

## 2020-02-09 DIAGNOSIS — E1165 Type 2 diabetes mellitus with hyperglycemia: Secondary | ICD-10-CM | POA: Diagnosis not present

## 2020-02-09 DIAGNOSIS — U071 COVID-19: Secondary | ICD-10-CM | POA: Diagnosis not present

## 2020-02-09 DIAGNOSIS — I5032 Chronic diastolic (congestive) heart failure: Secondary | ICD-10-CM | POA: Diagnosis not present

## 2020-02-09 DIAGNOSIS — I10 Essential (primary) hypertension: Secondary | ICD-10-CM | POA: Diagnosis not present

## 2020-02-09 LAB — CBC WITH DIFFERENTIAL/PLATELET
Abs Immature Granulocytes: 0.04 10*3/uL (ref 0.00–0.07)
Basophils Absolute: 0 10*3/uL (ref 0.0–0.1)
Basophils Relative: 0 %
Eosinophils Absolute: 0 10*3/uL (ref 0.0–0.5)
Eosinophils Relative: 0 %
HCT: 36.4 % — ABNORMAL LOW (ref 39.0–52.0)
Hemoglobin: 12.1 g/dL — ABNORMAL LOW (ref 13.0–17.0)
Immature Granulocytes: 0 %
Lymphocytes Relative: 9 %
Lymphs Abs: 0.8 10*3/uL (ref 0.7–4.0)
MCH: 26.6 pg (ref 26.0–34.0)
MCHC: 33.2 g/dL (ref 30.0–36.0)
MCV: 80 fL (ref 80.0–100.0)
Monocytes Absolute: 0.6 10*3/uL (ref 0.1–1.0)
Monocytes Relative: 7 %
Neutro Abs: 7.5 10*3/uL (ref 1.7–7.7)
Neutrophils Relative %: 84 %
Platelets: 270 10*3/uL (ref 150–400)
RBC: 4.55 MIL/uL (ref 4.22–5.81)
RDW: 14.4 % (ref 11.5–15.5)
WBC: 9 10*3/uL (ref 4.0–10.5)
nRBC: 0 % (ref 0.0–0.2)

## 2020-02-09 LAB — GLUCOSE, CAPILLARY
Glucose-Capillary: 365 mg/dL — ABNORMAL HIGH (ref 70–99)
Glucose-Capillary: 429 mg/dL — ABNORMAL HIGH (ref 70–99)
Glucose-Capillary: 452 mg/dL — ABNORMAL HIGH (ref 70–99)
Glucose-Capillary: 507 mg/dL (ref 70–99)
Glucose-Capillary: 532 mg/dL (ref 70–99)

## 2020-02-09 LAB — FERRITIN: Ferritin: 360 ng/mL — ABNORMAL HIGH (ref 24–336)

## 2020-02-09 LAB — COMPREHENSIVE METABOLIC PANEL
ALT: 29 U/L (ref 0–44)
AST: 19 U/L (ref 15–41)
Albumin: 3.2 g/dL — ABNORMAL LOW (ref 3.5–5.0)
Alkaline Phosphatase: 58 U/L (ref 38–126)
Anion gap: 10 (ref 5–15)
BUN: 32 mg/dL — ABNORMAL HIGH (ref 6–20)
CO2: 28 mmol/L (ref 22–32)
Calcium: 9 mg/dL (ref 8.9–10.3)
Chloride: 98 mmol/L (ref 98–111)
Creatinine, Ser: 1.26 mg/dL — ABNORMAL HIGH (ref 0.61–1.24)
GFR, Estimated: >60 mL/min (ref >60)
Glucose, Bld: 415 mg/dL — ABNORMAL HIGH (ref 70–99)
Potassium: 4.7 mmol/L (ref 3.5–5.1)
Sodium: 136 mmol/L (ref 135–145)
Total Bilirubin: 0.8 mg/dL (ref 0.3–1.2)
Total Protein: 7 g/dL (ref 6.5–8.1)

## 2020-02-09 LAB — MAGNESIUM: Magnesium: 2.1 mg/dL (ref 1.7–2.4)

## 2020-02-09 LAB — D-DIMER, QUANTITATIVE: D-Dimer, Quant: 0.39 ug/mL-FEU (ref 0.00–0.50)

## 2020-02-09 LAB — PHOSPHORUS: Phosphorus: 4 mg/dL (ref 2.5–4.6)

## 2020-02-09 LAB — LACTATE DEHYDROGENASE: LDH: 176 U/L (ref 98–192)

## 2020-02-09 LAB — C-REACTIVE PROTEIN: CRP: 12.1 mg/dL — ABNORMAL HIGH (ref <1.0)

## 2020-02-09 MED ORDER — METOPROLOL TARTRATE 25 MG PO TABS
12.5000 mg | ORAL_TABLET | Freq: Two times a day (BID) | ORAL | Status: DC
Start: 2020-02-09 — End: 2020-02-23
  Administered 2020-02-09 – 2020-02-22 (×26): 12.5 mg via ORAL
  Filled 2020-02-09 (×29): qty 1

## 2020-02-09 MED ORDER — INSULIN DETEMIR 100 UNIT/ML ~~LOC~~ SOLN
20.0000 [IU] | Freq: Every day | SUBCUTANEOUS | Status: DC
  Administered 2020-02-09: 20 [IU] via SUBCUTANEOUS
  Filled 2020-02-09 (×2): qty 0.2

## 2020-02-09 MED ORDER — INSULIN ASPART 100 UNIT/ML ~~LOC~~ SOLN
30.0000 [IU] | Freq: Three times a day (TID) | SUBCUTANEOUS | Status: DC
  Administered 2020-02-10 – 2020-02-11 (×4): 30 [IU] via SUBCUTANEOUS
  Filled 2020-02-09 (×4): qty 1

## 2020-02-09 MED ORDER — INSULIN ASPART 100 UNIT/ML ~~LOC~~ SOLN
20.0000 [IU] | Freq: Three times a day (TID) | SUBCUTANEOUS | Status: DC
  Administered 2020-02-09 (×2): 20 [IU] via SUBCUTANEOUS
  Filled 2020-02-09 (×2): qty 1

## 2020-02-09 MED ORDER — INSULIN DETEMIR 100 UNIT/ML ~~LOC~~ SOLN
30.0000 [IU] | Freq: Every day | SUBCUTANEOUS | Status: DC
  Administered 2020-02-10: 09:00:00 30 [IU] via SUBCUTANEOUS
  Filled 2020-02-09: qty 0.3

## 2020-02-09 MED ORDER — INSULIN ASPART 100 UNIT/ML IV SOLN
10.0000 [IU] | Freq: Once | INTRAVENOUS | Status: AC
  Administered 2020-02-09: 23:00:00 10 [IU] via INTRAVENOUS
  Filled 2020-02-09: qty 0.1

## 2020-02-09 MED ORDER — INSULIN ASPART 100 UNIT/ML IV SOLN: 8.0000 [IU] | Freq: Once | INTRAVENOUS | Status: DC

## 2020-02-09 NOTE — Progress Notes (Signed)
Pt blood glucose at 1955 was 533. Provider notified of results. New insulin orders are obtained and glucose frequency is increased to every four hours. Blood glucose at 0043 is 364. No acute distress noted.

## 2020-02-09 NOTE — Progress Notes (Signed)
PROGRESS NOTE    Raymond [name]  MWN:[id number] DOB: [birth year] DOA: 02/05/2020 PCP: [name], NP     Brief Narrative:  55 y.o. BM PMHx morbid obesity, DM type II controlled with hyperglycemia, DM neuropathy, OSA/OHS on trilogy, chronic respiratory failure with hypoxia on 2 L O2 at home, chronic diastolic CHF, PVD,   Exposed to family member with covid 2 days ago. Yesterday developed cough and shortness of breath. Also subjective fever. No chest pain, no pleuritic pain. No n/v/d. No abdominal pain. No dysuria. Has had 2 vaccines but no booster. Required 3 L at home, up from baseline of 2.   ED Course:   Labs, cxr, steroids, remdesivir.   Subjective: 1/9 afebrile overnight A/O x4 continues to have improvement in breathing.   Assessment & Plan: Covid vaccination; vaccinated 2/3   Principal Problem:   Pneumonia due to COVID-19 virus Active Problems:   OSA treated with Trilogy machine   Hypoxemia requiring supplemental oxygen   Morbid obesity (HCC)   Chronic diastolic heart failure (HCC)   HTN (hypertension)   Diabetes (HCC)   Chronic diastolic heart failure, NYHA class 1 (Raymond [name])   Uncontrolled type 2 diabetes mellitus with hyperglycemia (Raymond [name])   Morbid obesity with BMI of 50.0-59.9, adult (Raymond [name])   Acute hypoxic respiratory failure due to SARS COVID-19 viral pneumonia. COVID-19 Labs  Recent Labs    02/07/20 0403 02/07/20 1308 02/08/20 0659 02/09/20 0508  DDIMER <0.27  --  0.28 0.39  FERRITIN 496*  --  468* 360*  LDH  --  183 196* 176  CRP 7.9*  --  7.1* 12.1*   1/5 POC SARS coronavirus positive  - Remdesivir per pharmacy -Solu-Medrol 40 mg BID - Combivent QID - Flutter valve - Incentive spirometry - Titrate O2 to maintain SPO2> 40%  Chronic Diastolic CHF - Strict in and out -13.2 L - Daily weight Filed Weights   02/05/20 1031 02/06/20 2238  Weight: (!) 170 kg (!) 178.8 kg  - Amlodipine 10 mg daily - Losartan 50 mg daily -1/9  Metoprolol 12.5 mg BID  Essential HTN - See CHF  DM type II uncontrolled with hyperglycemia - 1/5 hemoglobin A1c= 7.8 -1/9 increase Levemir 45 units qhs ; 30 units Am - 1/9 increase NovoLog 30 units qac  - Resistant SSI - 1/7 patient seen by diabetic coordinator - 1/7 consult to nutrition; uncontrolled diabetic morbidly obese requires counseling for diabetic control and weight loss  Acute on CKD stage II (baseline Cr 1.36) Lab Results  Component Value Date   CREATININE 1.26 (H) 02/09/2020   CREATININE 1.08 02/08/2020   CREATININE 1.37 (H) 02/07/2020   CREATININE 1.36 (H) 02/06/2020   CREATININE 1.32 (H) 02/05/2020  }   Obesity class III - See diabetes  OSA - On trilogy at home, will use CPAP while in hospital per respiratory protocol   Patient continue to be at high risk for worsening respiratory failure   Status is: Inpatient  Remains inpatient appropriate because:IV treatments appropriate due to intensity of illness or inability to take PO    DVT prophylaxis: Lovenox Code Status: Full Family Communication:  Status is: Inpatient    Dispo: The patient is from: Home              Anticipated d/c is to: Home              Anticipated d/c date is: 1/11              Patient currently unstable  Consultants:    Procedures/Significant Events:    I have personally reviewed and interpreted all radiology studies and my findings are as above.  VENTILATOR SETTINGS: Nasal cannula 1/9 Flow 4 L/min SPO2 96%   Cultures 1/5 POC SARS coronavirus positive  Antimicrobials: Anti-infectives (From admission, onward)   Start     Ordered Stop   02/06/20 1000  remdesivir 100 mg in sodium chloride 0.9 % 100 mL IVPB       "Followed by" Linked Group Details   02/05/20 1141 02/10/20 0959   02/05/20 1245  remdesivir 200 mg in sodium chloride 0.9% 250 mL IVPB       "Followed by" Linked Group Details   02/05/20 1141 02/05/20 1248       Devices    LINES /  TUBES:      Continuous Infusions: . sodium chloride       Objective: Vitals:   02/08/20 2052 02/09/20 0038 02/09/20 0538 02/09/20 0700  BP: (!) 158/56 (!) 153/62 (!) 155/67 (!) 151/59  Pulse: 73 77 78 78  Resp: 18 18 18 18   Temp: 98.5 F (36.9 C) 99.1 F (37.3 C) 98.6 F (37 C) 98.6 F (37 C)  TempSrc: Oral Oral Oral Oral  SpO2: 94% 91% 97% 96%  Weight:      Height:        Intake/Output Summary (Last 24 hours) at 02/09/2020 [id number] Last data filed at 02/09/2020 0700 Gross per 24 hour  Intake 100 ml  Output 5550 ml  Net -5450 ml   Filed Weights   02/05/20 1031 02/06/20 2238  Weight: (!) 170 kg (!) 178.8 kg    Examination:  General: A/O x4, positive acute respiratory distress Eyes: negative scleral hemorrhage, negative anisocoria, negative icterus ENT: Negative Runny nose, negative gingival bleeding, Neck:  Negative scars, masses, torticollis, lymphadenopathy, JVD Lungs: decreased breath sounds bilaterally without wheezes or crackles Cardiovascular: Regular rate and rhythm without murmur gallop or rub normal S1 and S2 Abdomen: MORBIDLY OBESE, negative abdominal pain, nondistended, positive soft, bowel sounds, no rebound, no ascites, no appreciable mass Extremities: No significant cyanosis, clubbing, or edema bilateral lower extremities Skin: Negative rashes, lesions, ulcers Psychiatric:  Negative depression, negative anxiety, negative fatigue, negative mania  Central nervous system:  Cranial nerves II through XII intact, tongue/uvula midline, all extremities muscle strength 5/5, sensation intact throughout, negative dysarthria, negative expressive aphasia, negative receptive aphasia.  .     Data Reviewed: Care during the described time interval was provided by me .  I have reviewed this patient's available data, including medical history, events of note, physical examination, and all test results as part of my evaluation.  CBC: Recent Labs  Lab 02/05/20 1049  02/06/20 0435 02/07/20 1308 02/08/20 0659 02/09/20 0508  WBC 6.3 6.2 6.6 10.3 9.0  NEUTROABS 3.5 3.8 4.7 8.4* 7.5  HGB 10.2* 11.4* 11.9* 12.5* 12.1*  HCT 32.7* 36.7* 37.9* 39.2 36.4*  MCV 83.4 83.0 83.5 81.8 80.0  PLT 220 249 301 308 [id number]   Basic Metabolic Panel: Recent Labs  Lab 02/05/20 1049 02/05/20 1526 02/06/20 0435 02/07/20 0403 02/07/20 1308 02/08/20 0659 02/09/20 0508  NA 137  --  138 139  --  138 136  K 3.3*  --  4.5 4.7  --  4.1 4.7  CL 98  --  99 101  --  96* 98  CO2 27  --  28 29  --  31 28  GLUCOSE 342*  --  455* 425*  --  345*  415*  BUN 17  --  24* 32*  --  27* 32*  CREATININE 1.32*  --  1.36* 1.37*  --  1.08 1.26*  CALCIUM 8.0*  --  8.4* 8.1*  --  9.0 9.0  MG  --  1.6*  --   --  1.9 2.0 2.1  PHOS  --   --  3.9  --  5.0* 3.4 4.0   GFR: Estimated Creatinine Clearance: 109.4 mL/min (A) (by C-G formula based on SCr of 1.26 mg/dL (H)). Liver Function Tests: Recent Labs  Lab 02/05/20 1049 02/06/20 0435 02/07/20 0403 02/08/20 0659 02/09/20 0508  AST 40 37 30 23 19   ALT 36 41 38 34 29  ALKPHOS 57 59 55 66 58  BILITOT 1.1 0.8 0.7 0.7 0.8  PROT 6.6 7.2 7.2 7.6 7.0  ALBUMIN 3.3* 3.4* 3.4* 3.6 3.2*   No results for input(s): LIPASE, AMYLASE in the last 168 hours. No results for input(s): AMMONIA in the last 168 hours. Coagulation Profile: No results for input(s): INR, PROTIME in the last 168 hours. Cardiac Enzymes: No results for input(s): CKTOTAL, CKMB, CKMBINDEX, TROPONINI in the last 168 hours. BNP (last 3 results) No results for input(s): PROBNP in the last 8760 hours. HbA1C: No results for input(s): HGBA1C in the last 72 hours. CBG: Recent Labs  Lab 02/08/20 0820 02/08/20 1144 02/08/20 1606 02/08/20 2053 02/09/20 0751  GLUCAP 342* 349* 358* 452* 365*   Lipid Profile: No results for input(s): CHOL, HDL, LDLCALC, TRIG, CHOLHDL, LDLDIRECT in the last 72 hours. Thyroid Function Tests: No results for input(s): TSH, T4TOTAL, FREET4, T3FREE,  THYROIDAB in the last 72 hours. Anemia Panel: Recent Labs    02/08/20 0659 02/09/20 0508  FERRITIN 468* 360*   Sepsis Labs: Recent Labs  Lab 02/05/20 1049  PROCALCITON 0.15    No results found for this or any previous visit (from the past 240 hour(s)).       Radiology Studies: No results found.      Scheduled Meds: . (feeding supplement) PROSource Plus  30 mL Oral BID BM  . amLODipine  10 mg Oral Daily  . aspirin EC  81 mg Oral Daily  . enoxaparin (LOVENOX) injection  85 mg Subcutaneous Q24H  . gabapentin  300 mg Oral TID  . insulin aspart  0-20 Units Subcutaneous TID WC  . insulin aspart  0-5 Units Subcutaneous QHS  . insulin aspart  16 Units Subcutaneous TID WC  . insulin detemir  45 Units Subcutaneous QHS  . insulin detemir  8 Units Subcutaneous Daily  . Ipratropium-Albuterol  1 puff Inhalation QID  . losartan  50 mg Oral Daily  . methylPREDNISolone (SOLU-MEDROL) injection  40 mg Intravenous Q12H  . multivitamin with minerals  1 tablet Oral Daily  . pantoprazole  40 mg Oral Daily  . Ensure Max Protein  11 oz Oral BID  . sodium chloride flush  3 mL Intravenous Q12H  . torsemide  40 mg Oral Daily   Continuous Infusions: . sodium chloride       LOS: 4 days    Time spent:40 min    Seng Fouts, Geraldo Docker, MD [facility] Pager [phone]  If 7PM-7AM, please contact night-coverage [email or web address] Password [facility] 02/09/2020, 9:24 AM

## 2020-02-10 DIAGNOSIS — U071 COVID-19: Secondary | ICD-10-CM | POA: Diagnosis not present

## 2020-02-10 DIAGNOSIS — I5032 Chronic diastolic (congestive) heart failure: Secondary | ICD-10-CM | POA: Diagnosis not present

## 2020-02-10 DIAGNOSIS — E1165 Type 2 diabetes mellitus with hyperglycemia: Secondary | ICD-10-CM | POA: Diagnosis not present

## 2020-02-10 DIAGNOSIS — I10 Essential (primary) hypertension: Secondary | ICD-10-CM | POA: Diagnosis not present

## 2020-02-10 LAB — COMPREHENSIVE METABOLIC PANEL
ALT: 25 U/L (ref 0–44)
AST: 15 U/L (ref 15–41)
Albumin: 3.3 g/dL — ABNORMAL LOW (ref 3.5–5.0)
Alkaline Phosphatase: 54 U/L (ref 38–126)
Anion gap: 12 (ref 5–15)
BUN: 45 mg/dL — ABNORMAL HIGH (ref 6–20)
CO2: 28 mmol/L (ref 22–32)
Calcium: 9 mg/dL (ref 8.9–10.3)
Chloride: 93 mmol/L — ABNORMAL LOW (ref 98–111)
Creatinine, Ser: 1.43 mg/dL — ABNORMAL HIGH (ref 0.61–1.24)
GFR, Estimated: 58 mL/min — ABNORMAL LOW (ref >60)
Glucose, Bld: 465 mg/dL — ABNORMAL HIGH (ref 70–99)
Potassium: 4.1 mmol/L (ref 3.5–5.1)
Sodium: 133 mmol/L — ABNORMAL LOW (ref 135–145)
Total Bilirubin: 0.7 mg/dL (ref 0.3–1.2)
Total Protein: 7.5 g/dL (ref 6.5–8.1)

## 2020-02-10 LAB — CBC WITH DIFFERENTIAL/PLATELET
Abs Immature Granulocytes: 0.06 10*3/uL (ref 0.00–0.07)
Basophils Absolute: 0 10*3/uL (ref 0.0–0.1)
Basophils Relative: 0 %
Eosinophils Absolute: 0 10*3/uL (ref 0.0–0.5)
Eosinophils Relative: 0 %
HCT: 37 % — ABNORMAL LOW (ref 39.0–52.0)
Hemoglobin: 11.8 g/dL — ABNORMAL LOW (ref 13.0–17.0)
Immature Granulocytes: 1 %
Lymphocytes Relative: 8 %
Lymphs Abs: 0.9 10*3/uL (ref 0.7–4.0)
MCH: 25.8 pg — ABNORMAL LOW (ref 26.0–34.0)
MCHC: 31.9 g/dL (ref 30.0–36.0)
MCV: 81 fL (ref 80.0–100.0)
Monocytes Absolute: 0.6 10*3/uL (ref 0.1–1.0)
Monocytes Relative: 5 %
Neutro Abs: 9.5 10*3/uL — ABNORMAL HIGH (ref 1.7–7.7)
Neutrophils Relative %: 86 %
Platelets: 288 10*3/uL (ref 150–400)
RBC: 4.57 MIL/uL (ref 4.22–5.81)
RDW: 14.4 % (ref 11.5–15.5)
WBC: 11.1 10*3/uL — ABNORMAL HIGH (ref 4.0–10.5)
nRBC: 0 % (ref 0.0–0.2)

## 2020-02-10 LAB — C-REACTIVE PROTEIN: CRP: 19.5 mg/dL — ABNORMAL HIGH (ref <1.0)

## 2020-02-10 LAB — GLUCOSE, CAPILLARY
Glucose-Capillary: 439 mg/dL — ABNORMAL HIGH (ref 70–99)
Glucose-Capillary: 417 mg/dL — ABNORMAL HIGH (ref 70–99)
Glucose-Capillary: 464 mg/dL — ABNORMAL HIGH (ref 70–99)
Glucose-Capillary: 448 mg/dL — ABNORMAL HIGH (ref 70–99)
Glucose-Capillary: 440 mg/dL — ABNORMAL HIGH (ref 70–99)

## 2020-02-10 LAB — LACTATE DEHYDROGENASE: LDH: 205 U/L — ABNORMAL HIGH (ref 98–192)

## 2020-02-10 LAB — MAGNESIUM: Magnesium: 2.2 mg/dL (ref 1.7–2.4)

## 2020-02-10 LAB — FERRITIN: Ferritin: 337 ng/mL — ABNORMAL HIGH (ref 24–336)

## 2020-02-10 LAB — PHOSPHORUS: Phosphorus: 3.8 mg/dL (ref 2.5–4.6)

## 2020-02-10 LAB — D-DIMER, QUANTITATIVE: D-Dimer, Quant: 0.31 ug/mL-FEU (ref 0.00–0.50)

## 2020-02-10 MED ORDER — BARICITINIB 2 MG PO TABS
4.0000 mg | ORAL_TABLET | Freq: Every day | ORAL | Status: DC
  Administered 2020-02-10 – 2020-02-22 (×11): 4 mg via ORAL
  Filled 2020-02-10 (×17): qty 2

## 2020-02-10 MED ORDER — LIVING WELL WITH DIABETES BOOK
Freq: Once | Status: AC
  Filled 2020-02-10: qty 1

## 2020-02-10 MED ORDER — LINAGLIPTIN 5 MG PO TABS
5.0000 mg | ORAL_TABLET | Freq: Every day | ORAL | Status: DC
  Administered 2020-02-10 – 2020-02-11 (×2): 5 mg via ORAL
  Filled 2020-02-10 (×2): qty 1

## 2020-02-10 MED ORDER — INSULIN ASPART 100 UNIT/ML ~~LOC~~ SOLN
10.0000 [IU] | Freq: Once | SUBCUTANEOUS | Status: AC
  Administered 2020-02-11: 10 [IU] via INTRAVENOUS
  Filled 2020-02-10: qty 0.1

## 2020-02-10 MED ORDER — FUROSEMIDE 10 MG/ML IJ SOLN
60.0000 mg | Freq: Once | INTRAMUSCULAR | Status: AC
  Administered 2020-02-10: 19:00:00 60 mg via INTRAVENOUS
  Filled 2020-02-10: qty 6

## 2020-02-10 MED ORDER — INSULIN DETEMIR 100 UNIT/ML ~~LOC~~ SOLN
50.0000 [IU] | Freq: Two times a day (BID) | SUBCUTANEOUS | Status: DC
  Administered 2020-02-10 – 2020-02-11 (×2): 50 [IU] via SUBCUTANEOUS
  Filled 2020-02-10 (×3): qty 0.5

## 2020-02-10 MED ORDER — ENOXAPARIN SODIUM 100 MG/ML ~~LOC~~ SOLN
85.0000 mg | SUBCUTANEOUS | Status: DC
  Administered 2020-02-10 – 2020-02-22 (×13): 85 mg via SUBCUTANEOUS
  Filled 2020-02-10 (×15): qty 1

## 2020-02-10 MED ORDER — INSULIN ASPART 100 UNIT/ML ~~LOC~~ SOLN: 15.0000 [IU] | Freq: Once | SUBCUTANEOUS | Status: DC

## 2020-02-10 MED ORDER — INSULIN ASPART 100 UNIT/ML ~~LOC~~ SOLN
20.0000 [IU] | Freq: Once | SUBCUTANEOUS | Status: AC
  Administered 2020-02-10: 20 [IU] via SUBCUTANEOUS
  Filled 2020-02-10: qty 1

## 2020-02-10 NOTE — Progress Notes (Signed)
OT Cancellation Note  Patient Details Name: Raymond [name] MRN: [id number] DOB: 01-16-1966   Cancelled Treatment:    Reason Eval/Treat Not Completed: Medical issues which prohibited therapy. Chart reviewed. Pt noted with last three blood glucose results 439, 465, and 417. Contraindicated for therapy at this time. Will re-attempt at later date/time as medically appropriate.   Jeni Salles, MPH, MS, OTR/L ascom [phone] 02/10/20, 8:58 AM

## 2020-02-10 NOTE — Progress Notes (Signed)
Inpatient Diabetes Program Recommendations  AACE/ADA: New Consensus Statement on Inpatient Glycemic Control (2015)  Target Ranges:  Prepandial:   less than 140 mg/dL      Peak postprandial:   less than 180 mg/dL (1-2 hours)      Critically ill patients:  140 - 180 mg/dL   Lab Results  Component Value Date   GLUCAP 417 (H) 02/10/2020   HGBA1C 7.8 (H) 02/05/2020    Review of Glycemic Control Results for Raymond [name], Raymond [name] (MRN [id number]) as of 02/10/2020 10:24  Ref. Range 02/09/2020 07:51 02/09/2020 11:47 02/09/2020 15:47 02/09/2020 21:35 02/09/2020 21:37 02/09/2020 23:35 02/10/2020 05:03 02/10/2020 08:10  Glucose-Capillary Latest Ref Range: 70 - 99 mg/dL 365 (H) 429 (H) 452 (H) 506 (HH) 532 (HH) 507 (HH) 439 (H) 417 (H)   Inpatient Diabetes Program Recommendations:   Received order from Dr. Dia Crawford to increase Levemir to 50 units bid.  Thank you, Nani Gasser. Olivine Hiers, RN, MSN, CDE  Diabetes Coordinator Inpatient Glycemic Control Team Team Pager [phone] (8am-5pm) 02/10/2020 10:29 AM

## 2020-02-10 NOTE — Progress Notes (Signed)
PT Cancellation Note  Patient Details Name: Raymond [name] MRN: [id number] DOB: [birth year]   Cancelled Treatment:    Reason Eval/Treat Not Completed: Medical issues which prohibited therapy. BS is 465. Patient contraindicated at this time. Will re-attempt when appropriate.      Tala Eber 02/10/2020, 11:50 AM

## 2020-02-10 NOTE — Progress Notes (Signed)
PROGRESS NOTE    Raymond [name]  WUJ:[id number] DOB: 05-May-1965 DOA: 02/05/2020 PCP: [name], NP     Brief Narrative:  55 y.o. BM PMHx morbid obesity, DM type II controlled with hyperglycemia, DM neuropathy, OSA/OHS on trilogy, chronic respiratory failure with hypoxia on 2 L O2 at home, chronic diastolic CHF, PVD,   Exposed to family member with covid 2 days ago. Yesterday developed cough and shortness of breath. Also subjective fever. No chest pain, no pleuritic pain. No n/v/d. No abdominal pain. No dysuria. Has had 2 vaccines but no booster. Required 3 L at home, up from baseline of 2.   ED Course:   Labs, cxr, steroids, remdesivir.   Subjective: 1/10 T-max overnight 38 C,   afebrile overnight A/O x4 continues to have improvement in breathing.   Assessment & Plan: Covid vaccination; vaccinated 2/3   Principal Problem:   Pneumonia due to COVID-19 virus Active Problems:   OSA treated with Trilogy machine   Hypoxemia requiring supplemental oxygen   Morbid obesity (HCC)   Chronic diastolic heart failure (HCC)   HTN (hypertension)   Diabetes (HCC)   Chronic diastolic heart failure, NYHA class 1 ([place])   Uncontrolled type 2 diabetes mellitus with hyperglycemia ([place])   Morbid obesity with BMI of 50.0-59.9, adult ([place])   Acute hypoxic respiratory failure due to SARS COVID-19 viral pneumonia. COVID-19 Labs  Recent Labs    02/08/20 0659 02/09/20 0508 02/10/20 0508  DDIMER 0.28 0.39 0.31  FERRITIN 468* 360* 337*  LDH 196* 176 205*  CRP 7.1* 12.1* 19.5*   1/5 POC SARS coronavirus positive  - Remdesivir per pharmacy -Solu-Medrol 40 mg BID - Combivent QID - Flutter valve - Incentive spirometry - Titrate O2 to maintain SPO2> 92% -Upon admission spoke with patient concerning baricitinib explained that this drug was off label use.  Had not had a randomized controlled trial.  Counseled patient however dilated been shown to decrease length of stay  and decrease mortality in appropriate COVID patients.  Also explained that there was a remote risk of increased blood clots.  Patient gave me permission to use medication if required. -1/10 Baricitinib per pharmacy protocol.  Patient currently meeting criteria for use  Chronic Diastolic CHF - Strict in and out -13.2 L - Daily weight Filed Weights   02/05/20 1031 02/06/20 2238  Weight: (!) 170 kg (!) 178.8 kg  - Amlodipine 10 mg daily - Losartan 50 mg daily -Torsemide 40 mg daily -1/10 increase Metoprolol 25 mg BID -1/10 Lasix IV 60 mg x 1  Essential HTN - See CHF  DM type II uncontrolled with hyperglycemia - 1/5 hemoglobin A1c= 7.8 -/10 increase Levemir 50 units BID - 1/9 increase NovoLog 30 units qac  - Resistant SSI - 1/10 Tradjenta 5 mg daily - 1/7 patient seen by diabetic coordinator - 1/7 consult to nutrition; uncontrolled diabetic morbidly obese requires counseling for diabetic control and weight loss  Acute on CKD stage II (baseline Cr 1.36) Lab Results  Component Value Date   CREATININE 1.43 (H) 02/10/2020   CREATININE 1.26 (H) 02/09/2020   CREATININE 1.08 02/08/2020   CREATININE 1.37 (H) 02/07/2020   CREATININE 1.36 (H) 02/06/2020   Obesity class III - See diabetes  OSA - On trilogy at home, will use CPAP while in hospital per respiratory protocol   Patient continue to be at high risk for worsening respiratory failure   Status is: Inpatient  Remains inpatient appropriate because:IV treatments appropriate due to intensity of illness  or inability to take PO    DVT prophylaxis: Lovenox Code Status: Full Family Communication:  Status is: Inpatient    Dispo: The patient is from: Home              Anticipated d/c is to: Home              Anticipated d/c date is: 1/11              Patient currently unstable      Consultants:    Procedures/Significant Events:    I have personally reviewed and interpreted all radiology studies and my  findings are as above.  VENTILATOR SETTINGS: Nasal cannula 1/10 Flow 3 L/min  SPO2 90%   Cultures 1/5 POC SARS coronavirus positive  Antimicrobials: Anti-infectives (From admission, onward)   Start     Ordered Stop   02/06/20 1000  remdesivir 100 mg in sodium chloride 0.9 % 100 mL IVPB       "Followed by" Linked Group Details   02/05/20 1141 02/10/20 0959   02/05/20 1245  remdesivir 200 mg in sodium chloride 0.9% 250 mL IVPB       "Followed by" Linked Group Details   02/05/20 1141 02/05/20 1248       Devices    LINES / TUBES:      Continuous Infusions: . sodium chloride       Objective: Vitals:   02/10/20 0459 02/10/20 0815 02/10/20 1147 02/10/20 1546  BP: (!) 149/75 (!) 155/58 (!) 142/49 (!) 153/67  Pulse: 82 76 70 78  Resp: (!) 22 17 16 17   Temp: 99.6 F (37.6 C) (!) 100.4 F (38 C) 99.7 F (37.6 C) 99.2 F (37.3 C)  TempSrc: Axillary     SpO2: 100% 90% 92% (!) 85%  Weight:      Height:        Intake/Output Summary (Last 24 hours) at 02/10/2020 1702 Last data filed at 02/10/2020 0500 Gross per 24 hour  Intake --  Output 2500 ml  Net -2500 ml   Filed Weights   02/05/20 1031 02/06/20 2238  Weight: (!) 170 kg (!) 178.8 kg   Physical Exam:  General: A/O x4, positive acute respiratory distress, much more sleepy today. Eyes: negative scleral hemorrhage, negative anisocoria, negative icterus ENT: Negative Runny nose, negative gingival bleeding, Neck:  Negative scars, masses, torticollis, lymphadenopathy, JVD Lungs: decreased breath sounds bilaterally (part of that is secondary to his body habitus) without wheezes or crackles Cardiovascular: Regular rate and rhythm without murmur gallop or rub normal S1 and S2 Abdomen: MORBIDLY OBESE abdominal pain, nondistended, positive soft, bowel sounds, no rebound, no ascites, no appreciable mass Extremities: No significant cyanosis, clubbing, or edema bilateral lower extremities Skin: Negative rashes, lesions,  ulcers Psychiatric:  Negative depression, negative anxiety, negative fatigue, negative mania  Central nervous system:  Cranial nerves II through XII intact, tongue/uvula midline, all extremities muscle strength 5/5, sensation intact throughout, negative dysarthria, negative expressive aphasia, negative receptive aphasia.   .     Data Reviewed: Care during the described time interval was provided by me .  I have reviewed this patient's available data, including medical history, events of note, physical examination, and all test results as part of my evaluation.  CBC: Recent Labs  Lab 02/06/20 0435 02/07/20 1308 02/08/20 0659 02/09/20 0508 02/10/20 0508  WBC 6.2 6.6 10.3 9.0 11.1*  NEUTROABS 3.8 4.7 8.4* 7.5 9.5*  HGB 11.4* 11.9* 12.5* 12.1* 11.8*  HCT 36.7* 37.9* 39.2 36.4*  37.0*  MCV 83.0 83.5 81.8 80.0 81.0  PLT 249 301 308 270 130   Basic Metabolic Panel: Recent Labs  Lab 02/05/20 1526 02/06/20 0435 02/07/20 0403 02/07/20 1308 02/08/20 0659 02/09/20 0508 02/10/20 0508  NA  --  138 139  --  138 136 133*  K  --  4.5 4.7  --  4.1 4.7 4.1  CL  --  99 101  --  96* 98 93*  CO2  --  28 29  --  31 28 28   GLUCOSE  --  455* 425*  --  345* 415* 465*  BUN  --  24* 32*  --  27* 32* 45*  CREATININE  --  1.36* 1.37*  --  1.08 1.26* 1.43*  CALCIUM  --  8.4* 8.1*  --  9.0 9.0 9.0  MG 1.6*  --   --  1.9 2.0 2.1 2.2  PHOS  --  3.9  --  5.0* 3.4 4.0 3.8   GFR: Estimated Creatinine Clearance: 96.4 mL/min (A) (by C-G formula based on SCr of 1.43 mg/dL (H)). Liver Function Tests: Recent Labs  Lab 02/06/20 0435 02/07/20 0403 02/08/20 0659 02/09/20 0508 02/10/20 0508  AST 37 30 23 19 15   ALT 41 38 34 29 25  ALKPHOS 59 55 66 58 54  BILITOT 0.8 0.7 0.7 0.8 0.7  PROT 7.2 7.2 7.6 7.0 7.5  ALBUMIN 3.4* 3.4* 3.6 3.2* 3.3*   No results for input(s): LIPASE, AMYLASE in the last 168 hours. No results for input(s): AMMONIA in the last 168 hours. Coagulation Profile: No results for  input(s): INR, PROTIME in the last 168 hours. Cardiac Enzymes: No results for input(s): CKTOTAL, CKMB, CKMBINDEX, TROPONINI in the last 168 hours. BNP (last 3 results) No results for input(s): PROBNP in the last 8760 hours. HbA1C: No results for input(s): HGBA1C in the last 72 hours. CBG: Recent Labs  Lab 02/09/20 2335 02/10/20 0503 02/10/20 0810 02/10/20 1154 02/10/20 1545  GLUCAP 507* 439* 417* 464* 448*   Lipid Profile: No results for input(s): CHOL, HDL, LDLCALC, TRIG, CHOLHDL, LDLDIRECT in the last 72 hours. Thyroid Function Tests: No results for input(s): TSH, T4TOTAL, FREET4, T3FREE, THYROIDAB in the last 72 hours. Anemia Panel: Recent Labs    02/09/20 0508 02/10/20 0508  FERRITIN 360* 337*   Sepsis Labs: Recent Labs  Lab 02/05/20 1049  PROCALCITON 0.15    No results found for this or any previous visit (from the past 240 hour(s)).       Radiology Studies: No results found.      Scheduled Meds: . (feeding supplement) PROSource Plus  30 mL Oral BID BM  . amLODipine  10 mg Oral Daily  . aspirin EC  81 mg Oral Daily  . enoxaparin (LOVENOX) injection  85 mg Subcutaneous Q24H  . gabapentin  300 mg Oral TID  . insulin aspart  0-20 Units Subcutaneous TID WC  . insulin aspart  0-5 Units Subcutaneous QHS  . insulin aspart  30 Units Subcutaneous TID WC  . insulin detemir  50 Units Subcutaneous BID  . Ipratropium-Albuterol  1 puff Inhalation QID  . losartan  50 mg Oral Daily  . methylPREDNISolone (SOLU-MEDROL) injection  40 mg Intravenous Q12H  . metoprolol tartrate  12.5 mg Oral BID  . multivitamin with minerals  1 tablet Oral Daily  . pantoprazole  40 mg Oral Daily  . Ensure Max Protein  11 oz Oral BID  . sodium chloride flush  3 mL Intravenous  Q12H  . torsemide  40 mg Oral Daily   Continuous Infusions: . sodium chloride       LOS: 5 days    Time spent:40 min    Darnise Montag, Geraldo Docker, MD [facility] Pager [phone]  If 7PM-7AM,  please contact night-coverage [email or web address] Password [email or web address] 02/10/2020, 5:02 PM

## 2020-02-10 NOTE — Progress Notes (Signed)
Pt AM BG was 417. MD made aware via phone. No new orders given. Stated to give pt AM SSI at 20units plus scheduled 30units Novolog and 30 units levemir.

## 2020-02-10 NOTE — Progress Notes (Signed)
On call provider notified of pt's BG of 532 at 2137; one time dose of 10 units of insulin IV administered at 2254 after administering scheduled insulin at 2239.  On call provider notified of repeat BG of 507 at 2335.  An additional dose of 20 units of insulin SQ administered and provider ordered to recheck BG at 0400.

## 2020-02-11 ENCOUNTER — Inpatient Hospital Stay: Payer: Medicare HMO

## 2020-02-11 DIAGNOSIS — E1165 Type 2 diabetes mellitus with hyperglycemia: Secondary | ICD-10-CM | POA: Diagnosis not present

## 2020-02-11 DIAGNOSIS — I5032 Chronic diastolic (congestive) heart failure: Secondary | ICD-10-CM | POA: Diagnosis not present

## 2020-02-11 DIAGNOSIS — U071 COVID-19: Secondary | ICD-10-CM | POA: Diagnosis not present

## 2020-02-11 DIAGNOSIS — I10 Essential (primary) hypertension: Secondary | ICD-10-CM | POA: Diagnosis not present

## 2020-02-11 LAB — BASIC METABOLIC PANEL
Anion gap: 10 (ref 5–15)
Anion gap: 12 (ref 5–15)
Anion gap: 12 (ref 5–15)
Anion gap: 10 (ref 5–15)
BUN: 44 mg/dL — ABNORMAL HIGH (ref 6–20)
BUN: 46 mg/dL — ABNORMAL HIGH (ref 6–20)
BUN: 48 mg/dL — ABNORMAL HIGH (ref 6–20)
BUN: 51 mg/dL — ABNORMAL HIGH (ref 6–20)
CO2: 27 mmol/L (ref 22–32)
CO2: 26 mmol/L (ref 22–32)
CO2: 25 mmol/L (ref 22–32)
CO2: 28 mmol/L (ref 22–32)
Calcium: 9.2 mg/dL (ref 8.9–10.3)
Calcium: 9.2 mg/dL (ref 8.9–10.3)
Calcium: 9 mg/dL (ref 8.9–10.3)
Calcium: 9.2 mg/dL (ref 8.9–10.3)
Chloride: 91 mmol/L — ABNORMAL LOW (ref 98–111)
Chloride: 91 mmol/L — ABNORMAL LOW (ref 98–111)
Chloride: 90 mmol/L — ABNORMAL LOW (ref 98–111)
Chloride: 91 mmol/L — ABNORMAL LOW (ref 98–111)
Creatinine, Ser: 1.46 mg/dL — ABNORMAL HIGH (ref 0.61–1.24)
Creatinine, Ser: 1.4 mg/dL — ABNORMAL HIGH (ref 0.61–1.24)
Creatinine, Ser: 1.47 mg/dL — ABNORMAL HIGH (ref 0.61–1.24)
Creatinine, Ser: 1.48 mg/dL — ABNORMAL HIGH (ref 0.61–1.24)
GFR, Estimated: 56 mL/min — ABNORMAL LOW (ref >60)
GFR, Estimated: 59 mL/min — ABNORMAL LOW (ref >60)
GFR, Estimated: 56 mL/min — ABNORMAL LOW (ref >60)
GFR, Estimated: 55 mL/min — ABNORMAL LOW (ref >60)
Glucose, Bld: 581 mg/dL (ref 70–99)
Glucose, Bld: 547 mg/dL (ref 70–99)
Glucose, Bld: 518 mg/dL (ref 70–99)
Glucose, Bld: 458 mg/dL — ABNORMAL HIGH (ref 70–99)
Potassium: 5.2 mmol/L — ABNORMAL HIGH (ref 3.5–5.1)
Potassium: 5 mmol/L (ref 3.5–5.1)
Potassium: 5 mmol/L (ref 3.5–5.1)
Potassium: 4.7 mmol/L (ref 3.5–5.1)
Sodium: 128 mmol/L — ABNORMAL LOW (ref 135–145)
Sodium: 129 mmol/L — ABNORMAL LOW (ref 135–145)
Sodium: 127 mmol/L — ABNORMAL LOW (ref 135–145)
Sodium: 129 mmol/L — ABNORMAL LOW (ref 135–145)

## 2020-02-11 LAB — CBC WITH DIFFERENTIAL/PLATELET
Abs Immature Granulocytes: 0.06 10*3/uL (ref 0.00–0.07)
Basophils Absolute: 0 10*3/uL (ref 0.0–0.1)
Basophils Relative: 0 %
Eosinophils Absolute: 0 10*3/uL (ref 0.0–0.5)
Eosinophils Relative: 0 %
HCT: 36.5 % — ABNORMAL LOW (ref 39.0–52.0)
Hemoglobin: 11.8 g/dL — ABNORMAL LOW (ref 13.0–17.0)
Immature Granulocytes: 0 %
Lymphocytes Relative: 7 %
Lymphs Abs: 1 10*3/uL (ref 0.7–4.0)
MCH: 26 pg (ref 26.0–34.0)
MCHC: 32.3 g/dL (ref 30.0–36.0)
MCV: 80.4 fL (ref 80.0–100.0)
Monocytes Absolute: 0.7 10*3/uL (ref 0.1–1.0)
Monocytes Relative: 5 %
Neutro Abs: 11.8 10*3/uL — ABNORMAL HIGH (ref 1.7–7.7)
Neutrophils Relative %: 88 %
Platelets: 327 10*3/uL (ref 150–400)
RBC: 4.54 MIL/uL (ref 4.22–5.81)
RDW: 14.1 % (ref 11.5–15.5)
WBC: 13.5 10*3/uL — ABNORMAL HIGH (ref 4.0–10.5)
nRBC: 0 % (ref 0.0–0.2)

## 2020-02-11 LAB — COMPREHENSIVE METABOLIC PANEL
ALT: 22 U/L (ref 0–44)
AST: 19 U/L (ref 15–41)
Albumin: 3.3 g/dL — ABNORMAL LOW (ref 3.5–5.0)
Alkaline Phosphatase: 51 U/L (ref 38–126)
Anion gap: 13 (ref 5–15)
BUN: 44 mg/dL — ABNORMAL HIGH (ref 6–20)
CO2: 29 mmol/L (ref 22–32)
Calcium: 9.6 mg/dL (ref 8.9–10.3)
Chloride: 92 mmol/L — ABNORMAL LOW (ref 98–111)
Creatinine, Ser: 1.3 mg/dL — ABNORMAL HIGH (ref 0.61–1.24)
GFR, Estimated: >60 mL/min (ref >60)
Glucose, Bld: 377 mg/dL — ABNORMAL HIGH (ref 70–99)
Potassium: 4.9 mmol/L (ref 3.5–5.1)
Sodium: 134 mmol/L — ABNORMAL LOW (ref 135–145)
Total Bilirubin: 0.8 mg/dL (ref 0.3–1.2)
Total Protein: 7.8 g/dL (ref 6.5–8.1)

## 2020-02-11 LAB — GLUCOSE, CAPILLARY
Glucose-Capillary: 455 mg/dL — ABNORMAL HIGH (ref 70–99)
Glucose-Capillary: 401 mg/dL — ABNORMAL HIGH (ref 70–99)
Glucose-Capillary: 411 mg/dL — ABNORMAL HIGH (ref 70–99)
Glucose-Capillary: 504 mg/dL (ref 70–99)
Glucose-Capillary: 506 mg/dL (ref 70–99)
Glucose-Capillary: 515 mg/dL (ref 70–99)
Glucose-Capillary: 472 mg/dL — ABNORMAL HIGH (ref 70–99)
Glucose-Capillary: 414 mg/dL — ABNORMAL HIGH (ref 70–99)
Glucose-Capillary: 378 mg/dL — ABNORMAL HIGH (ref 70–99)

## 2020-02-11 LAB — OSMOLALITY: Osmolality: 311 mOsm/kg — ABNORMAL HIGH (ref 275–295)

## 2020-02-11 LAB — GLUCOSE, RANDOM: Glucose, Bld: 581 mg/dL (ref 70–99)

## 2020-02-11 LAB — C-REACTIVE PROTEIN: CRP: 18.9 mg/dL — ABNORMAL HIGH (ref <1.0)

## 2020-02-11 LAB — LACTATE DEHYDROGENASE: LDH: 258 U/L — ABNORMAL HIGH (ref 98–192)

## 2020-02-11 LAB — FERRITIN: Ferritin: 369 ng/mL — ABNORMAL HIGH (ref 24–336)

## 2020-02-11 LAB — PHOSPHORUS: Phosphorus: 3.8 mg/dL (ref 2.5–4.6)

## 2020-02-11 LAB — D-DIMER, QUANTITATIVE: D-Dimer, Quant: 0.46 ug/mL-FEU (ref 0.00–0.50)

## 2020-02-11 LAB — MAGNESIUM: Magnesium: 2.2 mg/dL (ref 1.7–2.4)

## 2020-02-11 MED ORDER — INSULIN REGULAR(HUMAN) IN NACL 100-0.9 UT/100ML-% IV SOLN
INTRAVENOUS | Status: AC
  Administered 2020-02-11: 22:00:00 19 [IU]/h via INTRAVENOUS
  Administered 2020-02-12: 25 [IU]/h via INTRAVENOUS
  Administered 2020-02-12: 10 [IU]/h via INTRAVENOUS
  Filled 2020-02-11 (×5): qty 100

## 2020-02-11 MED ORDER — CHLORHEXIDINE GLUCONATE CLOTH 2 % EX PADS
6.0000 | MEDICATED_PAD | Freq: Every day | CUTANEOUS | Status: DC
  Administered 2020-02-12 – 2020-02-21 (×9): 6 via TOPICAL

## 2020-02-11 MED ORDER — INSULIN DETEMIR 100 UNIT/ML ~~LOC~~ SOLN
70.0000 [IU] | Freq: Two times a day (BID) | SUBCUTANEOUS | Status: DC
  Filled 2020-02-11: qty 0.7

## 2020-02-11 MED ORDER — INSULIN REGULAR(HUMAN) IN NACL 100-0.9 UT/100ML-% IV SOLN
INTRAVENOUS | Status: DC
  Filled 2020-02-11: qty 100

## 2020-02-11 MED ORDER — DEXTROSE 50 % IV SOLN: 0.0000 mL | INTRAVENOUS | Status: DC | PRN

## 2020-02-11 MED ORDER — POTASSIUM CHLORIDE 10 MEQ/100ML IV SOLN
10.0000 meq | INTRAVENOUS | Status: AC
Start: 2020-02-11 — End: 2020-02-11
  Administered 2020-02-11 (×2): 10 meq via INTRAVENOUS
  Filled 2020-02-11: qty 100

## 2020-02-11 MED ORDER — INSULIN ASPART 100 UNIT/ML ~~LOC~~ SOLN: 50.0000 [IU] | Freq: Three times a day (TID) | SUBCUTANEOUS | Status: DC

## 2020-02-11 MED ORDER — INSULIN ASPART 100 UNIT/ML ~~LOC~~ SOLN: 45.0000 [IU] | Freq: Three times a day (TID) | SUBCUTANEOUS | Status: DC

## 2020-02-11 MED ORDER — FUROSEMIDE 10 MG/ML IJ SOLN
40.0000 mg | Freq: Once | INTRAMUSCULAR | Status: AC
  Administered 2020-02-11: 17:00:00 40 mg via INTRAVENOUS
  Filled 2020-02-11: qty 4

## 2020-02-11 MED ORDER — INSULIN ASPART 100 UNIT/ML ~~LOC~~ SOLN
20.0000 [IU] | Freq: Once | SUBCUTANEOUS | Status: AC
  Administered 2020-02-11: 20 [IU] via SUBCUTANEOUS

## 2020-02-11 MED ORDER — INSULIN ASPART 100 UNIT/ML ~~LOC~~ SOLN
40.0000 [IU] | Freq: Three times a day (TID) | SUBCUTANEOUS | Status: DC
  Administered 2020-02-11: 40 [IU] via SUBCUTANEOUS
  Filled 2020-02-11: qty 1

## 2020-02-11 MED ORDER — INSULIN ASPART 100 UNIT/ML ~~LOC~~ SOLN
0.0000 [IU] | SUBCUTANEOUS | Status: DC
  Filled 2020-02-11: qty 1

## 2020-02-11 MED ORDER — INSULIN DETEMIR 100 UNIT/ML ~~LOC~~ SOLN
75.0000 [IU] | Freq: Two times a day (BID) | SUBCUTANEOUS | Status: DC
  Administered 2020-02-11: 18:00:00 75 [IU] via SUBCUTANEOUS
  Filled 2020-02-11 (×3): qty 0.75

## 2020-02-11 MED ORDER — INSULIN ASPART 100 UNIT/ML ~~LOC~~ SOLN
45.0000 [IU] | Freq: Once | SUBCUTANEOUS | Status: AC
  Administered 2020-02-11: 17:00:00 45 [IU] via SUBCUTANEOUS
  Filled 2020-02-11: qty 1

## 2020-02-11 MED ORDER — SODIUM CHLORIDE 0.9 % IV SOLN: INTRAVENOUS | Status: DC

## 2020-02-11 MED ORDER — INSULIN ASPART 100 UNIT/ML ~~LOC~~ SOLN
10.0000 [IU] | Freq: Once | SUBCUTANEOUS | Status: AC
  Administered 2020-02-11: 03:00:00 10 [IU] via INTRAVENOUS
  Filled 2020-02-11: qty 0.1

## 2020-02-11 MED ORDER — INSULIN DETEMIR 100 UNIT/ML ~~LOC~~ SOLN
20.0000 [IU] | Freq: Once | SUBCUTANEOUS | Status: AC
  Administered 2020-02-11: 20 [IU] via SUBCUTANEOUS
  Filled 2020-02-11: qty 0.2

## 2020-02-11 MED ORDER — DEXTROSE IN LACTATED RINGERS 5 % IV SOLN: INTRAVENOUS | Status: DC

## 2020-02-11 MED ORDER — INSULIN ASPART 100 UNIT/ML ~~LOC~~ SOLN: 14.0000 [IU] | Freq: Once | SUBCUTANEOUS | Status: DC

## 2020-02-11 MED ORDER — LINAGLIPTIN 5 MG PO TABS
5.0000 mg | ORAL_TABLET | Freq: Every day | ORAL | Status: DC
  Administered 2020-02-13 – 2020-02-22 (×10): 5 mg via ORAL
  Filled 2020-02-11 (×11): qty 1

## 2020-02-11 NOTE — Progress Notes (Signed)
Inpatient Diabetes Program Recommendations  AACE/ADA: New Consensus Statement on Inpatient Glycemic Control   Target Ranges:  Prepandial:   less than 140 mg/dL      Peak postprandial:   less than 180 mg/dL (1-2 hours)      Critically ill patients:  140 - 180 mg/dL   Results for UZZIEL, RUSSEY (MRN [id number]) as of 02/11/2020 09:40  Ref. Range 02/10/2020 08:10 02/10/2020 11:54 02/10/2020 15:45 02/10/2020 22:14 02/11/2020 00:59 02/11/2020 03:42 02/11/2020 08:19  Glucose-Capillary Latest Ref Range: 70 - 99 mg/dL 417 (H) 464 (H) 448 (H) 440 (H) 455 (H) 401 (H) 411 (H)   Review of Glycemic Control  Diabetes history: DM2 Outpatient Diabetes medications: Levemir 89 units BID, Humalog 60 units TID with meals, Metformin 1000 mg BID Current orders for Inpatient glycemic control: Levemir 50 units BID, Novolog 30 units TID with meals, Novolog 0-20 units TID with meals, Novolog 0-5 units QHS, Tradjenta 5 mg daily; Solumedrol 40 mg Q12H  Inpatient Diabetes Program Recommendations:    Insulin: If steroids are continued as ordered please consider increasing Levemir to 70 units BID.  Thanks, Barnie Alderman, RN, MSN, CDE Diabetes Coordinator Inpatient Diabetes Program [phone] (Team Pager from 8am to 5pm)

## 2020-02-11 NOTE — Progress Notes (Signed)
Patients CBG was 411. Made MD aware, new order to give patient an additional 20 units along with sliding scale and Levemir.

## 2020-02-11 NOTE — Progress Notes (Signed)
Physical Therapy Treatment Patient Details Name: Raymond [name] MRN: [id number] DOB: 17-Apr-1965 Today's Date: 02/11/2020    History of Present Illness Raymond [name] is a 56yoM who comes to [facility] 02/05/20 BIBA c SOB, COVID (+) contact. Pt febrile (mildly), COVIDtest (+). PMH: CHF on 2L baseline, OSA on triology, DM2, bilat PND c orthopedic shoes, bilat toe amputations.    PT Comments    Pt EOB upon entry, reports he has been AMB ad lib in room since last PT visit several days back, uses [place] with extenders. Pt reports his general strength remains intact, as does his balance in general. Pt noted to be on 6L at entry, 73% SpO2, pt educated on focused breathing, asked to defer conversation several times until sats improved. Tried multiple fingers, then changed batteries on oximeter with similar results. Moved O2 to 8L without avail. Author then set up NRB on portable tank on 25L, DC [place], pt able to recover to 90% within 30sec, then moved down to 15L/min. RN aware of situation at end, in process of contact RT. No mobility this session. Pt educated on need to focus on breathing, importance of maintaining sats. Pt very upset about continued use of steroids, continued nonproductive cough, continued elevated BG.   Follow Up Recommendations  No PT follow up     Equipment Recommendations  None recommended by PT    Recommendations for Other Services       Precautions / Restrictions Precautions Precautions: Fall Precaution Comments: orthopedic shoes in room Restrictions Weight Bearing Restrictions: No    Mobility  Bed Mobility               General bed mobility comments: sitting at EOB at entry, noted 73% on 6L [place]  Transfers Overall transfer level:  (deferred)                  Ambulation/Gait                 Stairs             Wheelchair Mobility    Modified Rankin (Stroke Patients Only)       Balance   Sitting-balance support: Single  extremity supported;No upper extremity supported;Feet supported Sitting balance-Leahy Scale: Normal                                      Cognition Arousal/Alertness: Awake/alert Behavior During Therapy: WFL for tasks assessed/performed Overall Cognitive Status: Within Functional Limits for tasks assessed                                        Exercises      General Comments        Pertinent Vitals/Pain Pain Assessment: No/denies pain    Home Living                      Prior Function            PT Goals (current goals can now be found in the care plan section) Acute Rehab PT Goals Patient Stated Goal: return to home soon PT Goal Formulation: With patient Time For Goal Achievement: 02/21/20 Potential to Achieve Goals: Good Progress towards PT goals: Progressing toward goals    Frequency    Min 2X/week  PT Plan Current plan remains appropriate    Co-evaluation              AM-PAC PT "6 Clicks" Mobility   Outcome Measure  Help needed turning from your back to your side while in a flat bed without using bedrails?: None Help needed moving from lying on your back to sitting on the side of a flat bed without using bedrails?: None Help needed moving to and from a bed to a chair (including a wheelchair)?: None Help needed standing up from a chair using your arms (e.g., wheelchair or bedside chair)?: None Help needed to walk in hospital room?: A Little Help needed climbing 3-5 steps with a railing? : A Little 6 Click Score: 22    End of Session Equipment Utilized During Treatment: Oxygen Activity Tolerance: Patient tolerated treatment well;No increased pain Patient left: in chair;with call bell/phone within reach Nurse Communication: Mobility status PT Visit Diagnosis: Unsteadiness on feet (R26.81);Difficulty in walking, not elsewhere classified (R26.2);Other abnormalities of gait and mobility (R26.89)      Time: 6440-3474 PT Time Calculation (min) (ACUTE ONLY): 27 min  Charges:  $Self Care/Home Management: 23-37                     2:31 PM, 02/11/20 Etta Grandchild, PT, DPT Physical Therapist - [facility]  [phone] ([place])    [facility] 02/11/2020, 2:25 PM

## 2020-02-11 NOTE — Progress Notes (Signed)
Notified provider of blood sugars throughout the night. Order received and followed.  Will continue to monitor. Provider states to continue on current regimen after 2 doses of IV insulin to get blood sugar down to 401.

## 2020-02-11 NOTE — Progress Notes (Signed)
Communication with Judd Gaudier regarding blood sugar 440. Orders received and administered. Will continue to monitor.

## 2020-02-11 NOTE — Progress Notes (Signed)
Patient transferred to CCU.

## 2020-02-11 NOTE — Progress Notes (Signed)
MD asked to check patients CBG after patient is give 45 units of novolog and after dinner to see what patient is on sliding scale. Patients CBG was 515, messaged MD about same.

## 2020-02-11 NOTE — Progress Notes (Signed)
OT Cancellation Note  Patient Details Name: Kinneth Fujiwara MRN: [id number] DOB: November 06, 1965   Cancelled Treatment:    Reason Eval/Treat Not Completed: Medical issues which prohibited therapy. Chart reviewed. Pt blood glucose remains elevated (377, 411), falling outside guidelines for therapy. Will re-attempt at later date/time as medically appropriate.   Jeni Salles, MPH, MS, OTR/L ascom [phone] 02/11/20, 8:23 AM

## 2020-02-11 NOTE — Progress Notes (Signed)
Notified by PT that patients O2 sats were low 80s on 5LPM. O2 was turned up to 8 with no change. Placed patient on non-rebreather and sats came up to 85-89%. Respiratory called and patient was placed on 15 LPM via HFNC. Made MD aware of same.

## 2020-02-11 NOTE — Progress Notes (Signed)
MD aware of CBG of 504. MD on unit and placing orders. Madlyn Frankel, RN

## 2020-02-11 NOTE — Progress Notes (Addendum)
PROGRESS NOTE    Raymond [name]  IRS:[id number] DOB: 26-Aug-1965 DOA: 02/05/2020 PCP: [name], NP     Brief Narrative:  55 y.o. BM PMHx morbid obesity, DM type II controlled with hyperglycemia, DM neuropathy, OSA/OHS on trilogy, chronic respiratory failure with hypoxia on 2 L O2 at home, chronic diastolic CHF, PVD,   Exposed to family member with covid 2 days ago. Yesterday developed cough and shortness of breath. Also subjective fever. No chest pain, no pleuritic pain. No n/v/d. No abdominal pain. No dysuria. Has had 2 vaccines but no booster. Required 3 L at home, up from baseline of 2.   ED Course:   Labs, cxr, steroids, remdesivir.   Subjective: 1/11 afebrile overnight A/O x4, increased SOB   Assessment & Plan: Covid vaccination; vaccinated 2/3   Principal Problem:   Pneumonia due to COVID-19 virus Active Problems:   OSA treated with Trilogy machine   Hypoxemia requiring supplemental oxygen   Morbid obesity ([place])   Chronic diastolic heart failure (HCC)   HTN (hypertension)   Diabetes (HCC)   Chronic diastolic heart failure, NYHA class 1 ([place])   Uncontrolled type 2 diabetes mellitus with hyperglycemia ([place])   Morbid obesity with BMI of 50.0-59.9, adult ([place])   Acute hypoxic respiratory failure due to SARS COVID-19 viral pneumonia. COVID-19 Labs  Recent Labs    02/09/20 0508 02/10/20 0508 02/11/20 0604  DDIMER 0.39 0.31  --   FERRITIN 360* 337* 369*  LDH 176 205* 258*  CRP 12.1* 19.5*  --     1/5 POC SARS coronavirus positive  - Remdesivir per pharmacy -Solu-Medrol 40 mg BID - Combivent QID - Flutter valve - Incentive spirometry - Titrate O2 to maintain SPO2> 92% -Upon admission spoke with patient concerning baricitinib explained that this drug was off label use.  Had not had a randomized controlled trial.  Counseled patient however dilated been shown to decrease length of stay and decrease mortality in appropriate COVID patients.   Also explained that there was a remote risk of increased blood clots.  Patient gave me permission to use medication if required. -1/10 Baricitinib per pharmacy protocol.  Patient currently meeting criteria for use  Chronic Diastolic CHF - Strict in and out -13.2 L - Daily weight Filed Weights   02/05/20 1031 02/06/20 2238  Weight: (!) 170 kg (!) 178.8 kg  - Amlodipine 10 mg daily - Losartan 50 mg daily -Torsemide 40 mg daily -1/10 increase Metoprolol 25 mg BID -1/10 Lasix IV 60 mg x 1 -1/11 Lasix IV 40 mg x 1  Essential HTN - See CHF  DM type II uncontrolled with Hyperglycemia - 1/5 hemoglobin A1c= 7.8 - 1/7 patient seen by diabetic coordinator - 1/7 consult to nutrition; uncontrolled diabetic morbidly obese requires counseling for diabetic control and weight loss - 1/11 increase Levemir 75 units BID  - 1/11 increase NovoLog 50 units qac  - Resistant SSI  -1/10 Tradjenta 5 mg daily  - 1/11 Endo tool (canceled no beds)  Acute on CKD stage II (baseline Cr 1.36) Lab Results  Component Value Date   CREATININE 1.30 (H) 02/11/2020   CREATININE 1.43 (H) 02/10/2020   CREATININE 1.26 (H) 02/09/2020   CREATININE 1.08 02/08/2020   CREATININE 1.37 (H) 02/07/2020   Obesity class III - See diabetes  OSA - On trilogy at home, will use CPAP while in hospital per respiratory protocol   Patient continue to be at high risk for worsening respiratory failure   Status is: Inpatient  Remains inpatient appropriate because:IV treatments appropriate due to intensity of illness or inability to take PO    DVT prophylaxis: Lovenox Code Status: Full Family Communication: 1/11 spoke with Crista Elliot (friend) counseled on plan of care answered all questions Status is: Inpatient    Dispo: The patient is from: Home              Anticipated d/c is to: Home              Anticipated d/c date is: 1/11              Patient currently unstable      Consultants:     Procedures/Significant Events:    I have personally reviewed and interpreted all radiology studies and my findings are as above.  VENTILATOR SETTINGS: Nasal cannula 1/11 Flow 5 L/min SPO2 89%   Cultures 1/5 POC SARS coronavirus positive  Antimicrobials: Anti-infectives (From admission, onward)   Start     Ordered Stop   02/06/20 1000  remdesivir 100 mg in sodium chloride 0.9 % 100 mL IVPB       "Followed by" Linked Group Details   02/05/20 1141 02/10/20 0959   02/05/20 1245  remdesivir 200 mg in sodium chloride 0.9% 250 mL IVPB       "Followed by" Linked Group Details   02/05/20 1141 02/05/20 1248       Devices    LINES / TUBES:      Continuous Infusions: . sodium chloride       Objective: Vitals:   02/10/20 2353 02/11/20 0346 02/11/20 0753 02/11/20 0755  BP: (!) 157/64 (!) 165/51 (!) 145/56   Pulse: 81 81 79   Resp: (!) 22 18 18    Temp: 98.3 F (36.8 C) 98.1 F (36.7 C) 97.8 F (36.6 C)   TempSrc:  Oral    SpO2: (!) 84% 91% (!) 86% (!) 89%  Weight:      Height:        Intake/Output Summary (Last 24 hours) at 02/11/2020 1034 Last data filed at 02/11/2020 0600 Gross per 24 hour  Intake --  Output 2000 ml  Net -2000 ml   Filed Weights   02/05/20 1031 02/06/20 2238  Weight: (!) 170 kg (!) 178.8 kg   Physical Exam:  General: A/O x4, positive acute respiratory distress, much more sleepy today. Eyes: negative scleral hemorrhage, negative anisocoria, negative icterus ENT: Negative Runny nose, negative gingival bleeding, Neck:  Negative scars, masses, torticollis, lymphadenopathy, JVD Lungs: decreased breath sounds bilaterally (part of that is secondary to his body habitus) without wheezes or crackles Cardiovascular: Regular rate and rhythm without murmur gallop or rub normal S1 and S2 Abdomen: MORBIDLY OBESE abdominal pain, nondistended, positive soft, bowel sounds, no rebound, no ascites, no appreciable mass Extremities: No significant  cyanosis, clubbing, or edema bilateral lower extremities Skin: Negative rashes, lesions, ulcers Psychiatric:  Negative depression, negative anxiety, negative fatigue, negative mania  Central nervous system:  Cranial nerves II through XII intact, tongue/uvula midline, all extremities muscle strength 5/5, sensation intact throughout, negative dysarthria, negative expressive aphasia, negative receptive aphasia.   .     Data Reviewed: Care during the described time interval was provided by me .  I have reviewed this patient's available data, including medical history, events of note, physical examination, and all test results as part of my evaluation.  CBC: Recent Labs  Lab 02/07/20 1308 02/08/20 0659 02/09/20 0508 02/10/20 0508 02/11/20 0604  WBC 6.6 10.3 9.0 11.1* 13.5*  NEUTROABS 4.7 8.4* 7.5 9.5* 11.8*  HGB 11.9* 12.5* 12.1* 11.8* 11.8*  HCT 37.9* 39.2 36.4* 37.0* 36.5*  MCV 83.5 81.8 80.0 81.0 80.4  PLT 301 308 270 288 [id number]   Basic Metabolic Panel: Recent Labs  Lab 02/07/20 0403 02/07/20 1308 02/08/20 0659 02/09/20 0508 02/10/20 0508 02/11/20 0604  NA 139  --  138 136 133* 134*  K 4.7  --  4.1 4.7 4.1 4.9  CL 101  --  96* 98 93* 92*  CO2 29  --  31 28 28 29   GLUCOSE 425*  --  345* 415* 465* 377*  BUN 32*  --  27* 32* 45* 44*  CREATININE 1.37*  --  1.08 1.26* 1.43* 1.30*  CALCIUM 8.1*  --  9.0 9.0 9.0 9.6  MG  --  1.9 2.0 2.1 2.2 2.2  PHOS  --  5.0* 3.4 4.0 3.8 3.8   GFR: Estimated Creatinine Clearance: 106 mL/min (A) (by C-G formula based on SCr of 1.3 mg/dL (H)). Liver Function Tests: Recent Labs  Lab 02/07/20 0403 02/08/20 0659 02/09/20 0508 02/10/20 0508 02/11/20 0604  AST 30 23 19 15 19   ALT 38 34 29 25 22   ALKPHOS 55 66 58 54 51  BILITOT 0.7 0.7 0.8 0.7 0.8  PROT 7.2 7.6 7.0 7.5 7.8  ALBUMIN 3.4* 3.6 3.2* 3.3* 3.3*   No results for input(s): LIPASE, AMYLASE in the last 168 hours. No results for input(s): AMMONIA in the last 168 hours. Coagulation  Profile: No results for input(s): INR, PROTIME in the last 168 hours. Cardiac Enzymes: No results for input(s): CKTOTAL, CKMB, CKMBINDEX, TROPONINI in the last 168 hours. BNP (last 3 results) No results for input(s): PROBNP in the last 8760 hours. HbA1C: No results for input(s): HGBA1C in the last 72 hours. CBG: Recent Labs  Lab 02/10/20 1545 02/10/20 2214 02/11/20 0059 02/11/20 0342 02/11/20 0819  GLUCAP 448* 440* 455* 401* 411*   Lipid Profile: No results for input(s): CHOL, HDL, LDLCALC, TRIG, CHOLHDL, LDLDIRECT in the last 72 hours. Thyroid Function Tests: No results for input(s): TSH, T4TOTAL, FREET4, T3FREE, THYROIDAB in the last 72 hours. Anemia Panel: Recent Labs    02/10/20 0508 02/11/20 0604  FERRITIN 337* 369*   Sepsis Labs: Recent Labs  Lab 02/05/20 1049  PROCALCITON 0.15    No results found for this or any previous visit (from the past 240 hour(s)).       Radiology Studies: DG Chest Port 1 View  Result Date: 02/11/2020 CLINICAL DATA:  Shortness of breath.  COVID-19 positive EXAM: PORTABLE CHEST 1 VIEW COMPARISON:  February 05, 2020 FINDINGS: There is somewhat ill-defined airspace opacity throughout the lungs bilaterally, somewhat more on the left than on the right. Heart is slightly enlarged with pulmonary venous hypertension. No adenopathy. No bone lesions. IMPRESSION: Multifocal airspace opacity, likely due to multifocal pneumonia. A degree of superimposed pulmonary edema is difficult to exclude. Both edema and pneumonia may be present concurrently. There is mild cardiomegaly with a degree of pulmonary vascular congestion. Electronically Signed   By: Lowella Grip III M.D.   On: 02/11/2020 10:23        Scheduled Meds: . (feeding supplement) PROSource Plus  30 mL Oral BID BM  . amLODipine  10 mg Oral Daily  . aspirin EC  81 mg Oral Daily  . baricitinib  4 mg Oral Daily  . enoxaparin (LOVENOX) injection  85 mg Subcutaneous Q24H  . gabapentin   300 mg Oral TID  .  insulin aspart  0-20 Units Subcutaneous TID WC  . insulin aspart  0-5 Units Subcutaneous QHS  . insulin aspart  30 Units Subcutaneous TID WC  . insulin detemir  50 Units Subcutaneous BID  . Ipratropium-Albuterol  1 puff Inhalation QID  . linagliptin  5 mg Oral Daily  . losartan  50 mg Oral Daily  . methylPREDNISolone (SOLU-MEDROL) injection  40 mg Intravenous Q12H  . metoprolol tartrate  12.5 mg Oral BID  . multivitamin with minerals  1 tablet Oral Daily  . pantoprazole  40 mg Oral Daily  . Ensure Max Protein  11 oz Oral BID  . sodium chloride flush  3 mL Intravenous Q12H  . torsemide  40 mg Oral Daily   Continuous Infusions: . sodium chloride       LOS: 6 days    Time spent:40 min    Eliberto Sole, Geraldo Docker, MD [facility] Pager [phone]  If 7PM-7AM, please contact night-coverage [email or web address] Password [facility] 02/11/2020, 10:34 AM

## 2020-02-11 NOTE — Progress Notes (Signed)
[name] with lab called another 2 times and reported blood sugars of 547 and 518. MD made aware.

## 2020-02-11 NOTE — Progress Notes (Signed)
Raymond [name] with the Lab called and reported a critical Glucose Lab of 581. Made MD aware, new order to transfer patient for insulin drip.

## 2020-02-11 NOTE — Progress Notes (Signed)
Inpatient Diabetes Program Recommendations  AACE/ADA: New Consensus Statement on Inpatient Glycemic Control (2015)  Target Ranges:  Prepandial:   less than 140 mg/dL      Peak postprandial:   less than 180 mg/dL (1-2 hours)      Critically ill patients:  140 - 180 mg/dL   Lab Results  Component Value Date   GLUCAP 515 (HH) 02/11/2020   HGBA1C 7.8 (H) 02/05/2020    Review of Glycemic Control  Results for Raymond [name], Raymond [name] (MRN [id number]) as of 02/11/2020 16:45  Ref. Range 02/11/2020 00:59 02/11/2020 03:42 02/11/2020 08:19 02/11/2020 12:19 02/11/2020 15:56  Glucose-Capillary Latest Ref Range: 70 - 99 mg/dL 455 (H) 401 (H) 411 (H) 504 (HH) 515 (HH)    Diabetes history: DM2 Outpatient Diabetes medications: Levemir 89 units BID, Humalog 60 units TID with meals, Metformin 1000 mg BID Current orders for Inpatient glycemic control: IV Insulin  Inpatient Diabetes Program Recommendations:    After receiving a total of 70 units of Levemir today and 130 units of Novolog CBG is 515 mg/dL at 1556.  MD has ordered IV insulin.  Agree with MD order for IV Insulin.   Will continue to follow while inpatient.  Thank you, Reche Dixon, RN, BSN Diabetes Coordinator Inpatient Diabetes Program [phone] (team pager from 8a-5p)

## 2020-02-12 DIAGNOSIS — E1165 Type 2 diabetes mellitus with hyperglycemia: Secondary | ICD-10-CM | POA: Diagnosis not present

## 2020-02-12 DIAGNOSIS — I10 Essential (primary) hypertension: Secondary | ICD-10-CM | POA: Diagnosis not present

## 2020-02-12 DIAGNOSIS — U071 COVID-19: Secondary | ICD-10-CM | POA: Diagnosis not present

## 2020-02-12 LAB — GLUCOSE, CAPILLARY
Glucose-Capillary: 147 mg/dL — ABNORMAL HIGH (ref 70–99)
Glucose-Capillary: 165 mg/dL — ABNORMAL HIGH (ref 70–99)
Glucose-Capillary: 182 mg/dL — ABNORMAL HIGH (ref 70–99)
Glucose-Capillary: 185 mg/dL — ABNORMAL HIGH (ref 70–99)
Glucose-Capillary: 193 mg/dL — ABNORMAL HIGH (ref 70–99)
Glucose-Capillary: 195 mg/dL — ABNORMAL HIGH (ref 70–99)
Glucose-Capillary: 212 mg/dL — ABNORMAL HIGH (ref 70–99)
Glucose-Capillary: 214 mg/dL — ABNORMAL HIGH (ref 70–99)
Glucose-Capillary: 228 mg/dL — ABNORMAL HIGH (ref 70–99)
Glucose-Capillary: 239 mg/dL — ABNORMAL HIGH (ref 70–99)
Glucose-Capillary: 243 mg/dL — ABNORMAL HIGH (ref 70–99)
Glucose-Capillary: 273 mg/dL — ABNORMAL HIGH (ref 70–99)
Glucose-Capillary: 275 mg/dL — ABNORMAL HIGH (ref 70–99)
Glucose-Capillary: 278 mg/dL — ABNORMAL HIGH (ref 70–99)
Glucose-Capillary: 278 mg/dL — ABNORMAL HIGH (ref 70–99)
Glucose-Capillary: 291 mg/dL — ABNORMAL HIGH (ref 70–99)
Glucose-Capillary: 296 mg/dL — ABNORMAL HIGH (ref 70–99)
Glucose-Capillary: 302 mg/dL — ABNORMAL HIGH (ref 70–99)
Glucose-Capillary: 305 mg/dL — ABNORMAL HIGH (ref 70–99)
Glucose-Capillary: 325 mg/dL — ABNORMAL HIGH (ref 70–99)
Glucose-Capillary: 401 mg/dL — ABNORMAL HIGH (ref 70–99)

## 2020-02-12 LAB — BASIC METABOLIC PANEL
Anion gap: 7 (ref 5–15)
BUN: 50 mg/dL — ABNORMAL HIGH (ref 6–20)
CO2: 34 mmol/L — ABNORMAL HIGH (ref 22–32)
Calcium: 9.1 mg/dL (ref 8.9–10.3)
Chloride: 94 mmol/L — ABNORMAL LOW (ref 98–111)
Creatinine, Ser: 1.44 mg/dL — ABNORMAL HIGH (ref 0.61–1.24)
GFR, Estimated: 57 mL/min — ABNORMAL LOW (ref >60)
Glucose, Bld: 213 mg/dL — ABNORMAL HIGH (ref 70–99)
Potassium: 4.5 mmol/L (ref 3.5–5.1)
Sodium: 135 mmol/L (ref 135–145)

## 2020-02-12 LAB — C-REACTIVE PROTEIN: CRP: 16.9 mg/dL — ABNORMAL HIGH (ref <1.0)

## 2020-02-12 LAB — LACTATE DEHYDROGENASE: LDH: 249 U/L — ABNORMAL HIGH (ref 98–192)

## 2020-02-12 LAB — CBC WITH DIFFERENTIAL/PLATELET
Abs Immature Granulocytes: 0.06 10*3/uL (ref 0.00–0.07)
Basophils Absolute: 0 10*3/uL (ref 0.0–0.1)
Basophils Relative: 0 %
Eosinophils Absolute: 0.1 10*3/uL (ref 0.0–0.5)
Eosinophils Relative: 1 %
HCT: 35.9 % — ABNORMAL LOW (ref 39.0–52.0)
Hemoglobin: 11.3 g/dL — ABNORMAL LOW (ref 13.0–17.0)
Immature Granulocytes: 1 %
Lymphocytes Relative: 18 %
Lymphs Abs: 2.1 10*3/uL (ref 0.7–4.0)
MCH: 25.5 pg — ABNORMAL LOW (ref 26.0–34.0)
MCHC: 31.5 g/dL (ref 30.0–36.0)
MCV: 81 fL (ref 80.0–100.0)
Monocytes Absolute: 0.8 10*3/uL (ref 0.1–1.0)
Monocytes Relative: 7 %
Neutro Abs: 8.4 10*3/uL — ABNORMAL HIGH (ref 1.7–7.7)
Neutrophils Relative %: 73 %
Platelets: 364 10*3/uL (ref 150–400)
RBC: 4.43 MIL/uL (ref 4.22–5.81)
RDW: 14.1 % (ref 11.5–15.5)
WBC: 11.5 10*3/uL — ABNORMAL HIGH (ref 4.0–10.5)
nRBC: 0 % (ref 0.0–0.2)

## 2020-02-12 LAB — COMPREHENSIVE METABOLIC PANEL
ALT: 23 U/L (ref 0–44)
AST: 17 U/L (ref 15–41)
Albumin: 3.2 g/dL — ABNORMAL LOW (ref 3.5–5.0)
Alkaline Phosphatase: 54 U/L (ref 38–126)
Anion gap: 9 (ref 5–15)
BUN: 49 mg/dL — ABNORMAL HIGH (ref 6–20)
CO2: 31 mmol/L (ref 22–32)
Calcium: 9.4 mg/dL (ref 8.9–10.3)
Chloride: 95 mmol/L — ABNORMAL LOW (ref 98–111)
Creatinine, Ser: 1.4 mg/dL — ABNORMAL HIGH (ref 0.61–1.24)
GFR, Estimated: 59 mL/min — ABNORMAL LOW (ref >60)
Glucose, Bld: 259 mg/dL — ABNORMAL HIGH (ref 70–99)
Potassium: 4.4 mmol/L (ref 3.5–5.1)
Sodium: 135 mmol/L (ref 135–145)
Total Bilirubin: 0.5 mg/dL (ref 0.3–1.2)
Total Protein: 7.6 g/dL (ref 6.5–8.1)

## 2020-02-12 LAB — PHOSPHORUS: Phosphorus: 4.3 mg/dL (ref 2.5–4.6)

## 2020-02-12 LAB — FERRITIN: Ferritin: 366 ng/mL — ABNORMAL HIGH (ref 24–336)

## 2020-02-12 LAB — D-DIMER, QUANTITATIVE: D-Dimer, Quant: 0.52 ug/mL-FEU — ABNORMAL HIGH (ref 0.00–0.50)

## 2020-02-12 LAB — MAGNESIUM: Magnesium: 2.2 mg/dL (ref 1.7–2.4)

## 2020-02-12 MED ORDER — INSULIN ASPART 100 UNIT/ML ~~LOC~~ SOLN: 50.0000 [IU] | Freq: Three times a day (TID) | SUBCUTANEOUS | Status: DC

## 2020-02-12 MED ORDER — INSULIN DETEMIR 100 UNIT/ML ~~LOC~~ SOLN: 100.0000 [IU] | Freq: Two times a day (BID) | SUBCUTANEOUS | Status: DC

## 2020-02-12 MED ORDER — METHYLPREDNISOLONE SODIUM SUCC 125 MG IJ SOLR
60.0000 mg | Freq: Every day | INTRAMUSCULAR | Status: DC
  Administered 2020-02-13 – 2020-02-15 (×3): 60 mg via INTRAVENOUS
  Filled 2020-02-12 (×3): qty 2

## 2020-02-12 MED ORDER — FUROSEMIDE 10 MG/ML IJ SOLN
40.0000 mg | Freq: Once | INTRAMUSCULAR | Status: AC
  Administered 2020-02-12: 40 mg via INTRAVENOUS
  Filled 2020-02-12: qty 4

## 2020-02-12 MED ORDER — INSULIN ASPART 100 UNIT/ML ~~LOC~~ SOLN
0.0000 [IU] | SUBCUTANEOUS | Status: DC
  Administered 2020-02-12: 3 [IU] via SUBCUTANEOUS
  Administered 2020-02-13: 20 [IU] via SUBCUTANEOUS
  Administered 2020-02-13: 15 [IU] via SUBCUTANEOUS
  Administered 2020-02-13: 7 [IU] via SUBCUTANEOUS
  Administered 2020-02-13: 4 [IU] via SUBCUTANEOUS
  Administered 2020-02-13: 20 [IU] via SUBCUTANEOUS
  Administered 2020-02-14 (×2): 15 [IU] via SUBCUTANEOUS
  Administered 2020-02-14: 20 [IU] via SUBCUTANEOUS
  Administered 2020-02-14 (×2): 7 [IU] via SUBCUTANEOUS
  Administered 2020-02-15: 30 [IU] via SUBCUTANEOUS
  Administered 2020-02-15: 20 [IU] via SUBCUTANEOUS
  Administered 2020-02-15: 11 [IU] via SUBCUTANEOUS
  Administered 2020-02-15: 15 [IU] via SUBCUTANEOUS
  Filled 2020-02-12 (×17): qty 1

## 2020-02-12 MED ORDER — INSULIN REGULAR HUMAN (CONC) 500 UNIT/ML ~~LOC~~ SOPN
35.0000 [IU] | PEN_INJECTOR | Freq: Two times a day (BID) | SUBCUTANEOUS | Status: DC
  Administered 2020-02-12 – 2020-02-13 (×3): 35 [IU] via SUBCUTANEOUS
  Filled 2020-02-12: qty 3

## 2020-02-12 MED ORDER — INSULIN REGULAR HUMAN (CONC) 500 UNIT/ML ~~LOC~~ SOPN
35.0000 [IU] | PEN_INJECTOR | Freq: Two times a day (BID) | SUBCUTANEOUS | Status: DC
  Filled 2020-02-12: qty 3

## 2020-02-12 NOTE — TOC Progression Note (Signed)
Transition of Care [facility]) - Progression Note    Patient Details  Name: Raymond [name] MRN: [id number] Date of Birth: [birth year]  Transition of Care [facility]) CM/SW [place], [place] Phone Number: [phone] 02/12/2020, 11:59 AM  Clinical Narrative:     Patient unable to participate in PT/OT assessments.  Patient is agreeable to home health and [place] is able to accept patient when he is ready to d/c.   Expected Discharge Plan: [place] Barriers to Discharge: Continued Medical Work up  Expected Discharge Plan and Services Expected Discharge Plan: [place]   Discharge Planning Services: CM Consult Post Acute Care Choice: [place] arrangements for the past 2 months: Apartment                 DME Arranged: N/A DME Agency: NA       HH Arranged: RN [place] Agency: [place] ([place]) Date [place]: 02/07/20 Time [place]: [place] Representative spoke with at [place]: [place] ([place]) Interventions    Readmission Risk Interventions No flowsheet data found.

## 2020-02-12 NOTE — Progress Notes (Signed)
PT Cancellation Note  Patient Details Name: Raymond [name] MRN: [id number] DOB: [birth year]   Cancelled Treatment:    Reason Eval/Treat Not Completed: Medical issues which prohibited therapy (Pt moved to SDU yesterday. Please place new PT order once pt is medically appropriate to resume therapy services. Will sign off.)   Daemyn Gariepy C 02/12/2020, 9:06 AM

## 2020-02-12 NOTE — Progress Notes (Signed)
OT Cancellation Note  Patient Details Name: Raymond [name] MRN: [id number] DOB: [birth year]   Cancelled Treatment:    Reason Eval/Treat Not Completed: Medical issues which prohibited therapy. Pt transferred to CCU after a decline in status. OT will SIGN OFF. Please re-consult when pt is appropriate for OT intervention.   Darleen Crocker, [place], OTR/L , CBIS ascom [phone]  02/12/20, 8:37 AM   02/12/2020, 8:36 AM

## 2020-02-12 NOTE — Progress Notes (Signed)
Inpatient Diabetes Program Recommendations  AACE/ADA: New Consensus Statement on Inpatient Glycemic Control   Target Ranges:  Prepandial:   less than 140 mg/dL      Peak postprandial:   less than 180 mg/dL (1-2 hours)      Critically ill patients:  140 - 180 mg/dL  Results for Raymond [name], Raymond [name] (MRN [id number]) as of 02/12/2020 06:22  Ref. Range 02/12/2020 00:06 02/12/2020 01:04 02/12/2020 02:03 02/12/2020 03:06 02/12/2020 04:09 02/12/2020 05:17  Glucose-Capillary Latest Ref Range: 70 - 99 mg/dL 305 (H)  21 units/hr 273 (H)  17 units/hr 228 (H)  11 units/hr 212 (H)  10 units/hr 195 (H)  8.5 units/hr 165 (H)  5 units/hr   Results for Raymond [name], Raymond [name] (MRN [id number]) as of 02/12/2020 06:22  Ref. Range 02/11/2020 08:19 02/11/2020 12:19 02/11/2020 15:56 02/11/2020 17:50 02/11/2020 21:13 02/11/2020 22:57  Glucose-Capillary Latest Ref Range: 70 - 99 mg/dL 411 (H)  Novolog 70 units  Levemir 50 units 504 (HH)  Novolog 60 units  Levemir 20 units 515 (HH) 472 (H)  Novolog 45 units  Levemir 75 units 414 (H)  19 units/hr 378 (H)  21 units/hr   Review of Glycemic Control  Diabetes history: DM2 Outpatient Diabetes medications: Levemir 89 units BID, Humalog 60 units TID with meals, Metformin 1000 mg BID Current orders for Inpatient glycemic control: IV insulin, Levemir 75 units BID, Novolog 50 units TID with meals, Tradjenta 5 mg daily; Solumedrol 40 mg Q12H  Inpatient Diabetes Program Recommendations:    Current Insulin orders: Please discontinue Levemir and Novolog SQ orders as patient is on IV insulin.   Insulin at Transition: Once provider is ready to transition from IV to SQ insulin and if steroids are continued as ordered, please consider ordering Levemir to 100 units BID, Novolog 50 units TID with meals, CBGs Q4H, Novolog 0-20 units Q4H. Due to high insulin requirements, may want to consider using Humulin R U500 insulin. If provider prefers to use Humulin R U500  (instead of Levemir and Novolog meal coverage) and steroids continued would recommend Humulin R U500 35 units BID (with breakfast and supper), CBGs Q4H, Novolog 0-20 units Q4H.  Thanks, Barnie Alderman, RN, MSN, CDE Diabetes Coordinator Inpatient Diabetes Program [phone] (Team Pager from 8am to 5pm)

## 2020-02-12 NOTE — Progress Notes (Addendum)
[name], male   DOB: 1965-04-14, 55 y.o.   MRN: [id number]   Will start pt on Humulin R 500 35 units bid tonite, cont Insulin gtt for 2 more hours and if sugars <300 then d/c insullin gtt. This was d/w Pharmacist, p't RN and diab coordinator.   Per Pharmacy U500 is non formulary--will use Levemir +novolog + ssi as per diab coordinator recommendaitons

## 2020-02-12 NOTE — Progress Notes (Signed)
[place] at [place] NAME: Raymond [name]    MR#:  [id number]  DATE OF BIRTH:  April 28, 1965  SUBJECTIVE:   Patient transferred to ICU for IV insulin drip. Sugars initially were stable how were currently in upper 200 to 300. Continue IV insulin drip.  Remains on high flow nasal cannula oxygen. REVIEW OF SYSTEMS:   Review of Systems  Constitutional: Negative for chills, fever and weight loss.  HENT: Negative for ear discharge, ear pain and nosebleeds.   Eyes: Negative for blurred vision, pain and discharge.  Respiratory: Positive for shortness of breath. Negative for sputum production, wheezing and stridor.   Cardiovascular: Negative for chest pain, palpitations, orthopnea and PND.  Gastrointestinal: Negative for abdominal pain, diarrhea, nausea and vomiting.  Genitourinary: Negative for frequency and urgency.  Musculoskeletal: Negative for back pain and joint pain.  Neurological: Positive for weakness. Negative for sensory change, speech change and focal weakness.  Psychiatric/Behavioral: Negative for depression and hallucinations. The patient is not nervous/anxious.    Tolerating Diet:yes Tolerating PT: No PT f/u after evaluation  DRUG ALLERGIES:  No Known Allergies  VITALS:  Blood pressure 138/70, pulse 77, temperature 99.1 F (37.3 C), temperature source Oral, resp. rate (!) 25, height 6' (1.829 m), weight (!) 178.8 kg, SpO2 92 %.  PHYSICAL EXAMINATION:   Physical Exam  GENERAL:  55 y.o.-year-old patient lying in the bed with no acute distress. Morbidly obese HEENT: Head atraumatic, normocephalic. Oropharynx and nasopharynx clear. HFNC+ NECK:  Supple, no jugular venous distention. No thyroid enlargement, no tenderness.  LUNGS:shallow breath sounds bilaterally, no wheezing, rales, rhonchi. No use of accessory muscles of respiration.  CARDIOVASCULAR: S1, S2 normal. No murmurs, rubs, or gallops.  ABDOMEN: Soft, nontender,  nondistended. Bowel sounds present. No organomegaly or mass. Abdominal obesity EXTREMITIES: No cyanosis, clubbing  chronic + edema b/l.    NEUROLOGIC: Cranial nerves II through XII are intact. No focal Motor or sensory deficits b/l.   PSYCHIATRIC:  patient is alert and oriented x 3.  SKIN: No obvious rash, lesion, or ulcer.   LABORATORY PANEL:  CBC Recent Labs  Lab 02/12/20 0126  WBC 11.5*  HGB 11.3*  HCT 35.9*  PLT 364    Chemistries  Recent Labs  Lab 02/12/20 0126 02/12/20 0312  NA 135 135  K 4.4 4.5  CL 95* 94*  CO2 31 34*  GLUCOSE 259* 213*  BUN 49* 50*  CREATININE 1.40* 1.44*  CALCIUM 9.4 9.1  MG 2.2  --   AST 17  --   ALT 23  --   ALKPHOS 54  --   BILITOT 0.5  --    Cardiac Enzymes No results for input(s): TROPONINI in the last 168 hours. RADIOLOGY:  DG Chest Port 1 View  Result Date: 02/11/2020 CLINICAL DATA:  Shortness of breath.  COVID-19 positive EXAM: PORTABLE CHEST 1 VIEW COMPARISON:  February 05, 2020 FINDINGS: There is somewhat ill-defined airspace opacity throughout the lungs bilaterally, somewhat more on the left than on the right. Heart is slightly enlarged with pulmonary venous hypertension. No adenopathy. No bone lesions. IMPRESSION: Multifocal airspace opacity, likely due to multifocal pneumonia. A degree of superimposed pulmonary edema is difficult to exclude. Both edema and pneumonia may be present concurrently. There is mild cardiomegaly with a degree of pulmonary vascular congestion. Electronically Signed   By: Lowella Grip III M.D.   On: 02/11/2020 10:23   ASSESSMENT AND PLAN:  55 y.o.BM PMHx morbid obesity, DM type  II controlled with hyperglycemia, DM neuropathy, OSA/OHS on trilogy, chronic respiratory failure with hypoxia on 2 L O2 at home, chronic diastolic CHF, PVD. Pt developed cough and shortness of breath. Also subjective fever. No chest pain, no pleuritic pain. No n/v/d. No abdominal pain. No dysuria. Has had 2 vaccines but no  booster. Required 3 L at home, up from baseline of 2 L  Acute hypoxic respiratory failure due to SARS COVID-19 viral pneumonia.  Recent Labs (last 2 labs)        Recent Labs    02/09/20 0508 02/10/20 0508 02/11/20 0604  DDIMER 0.39 0.31  --   FERRITIN 360* 337* 369*  LDH 176 205* 258*  CRP 12.1* 19.5*  --       02/05/20 POC SARS coronavirus positive - completed Remdesivir per pharmacy -Solu-Medrol 40 mg BID - Combivent QID - Flutter valve and Incentive spirometry - Titrate O2 to maintain SPO2> 92% --1/10 Baricitinib per pharmacy protocol.  Patient currently meeting criteria for use  Chronic Diastolic CHF - Strict in and out -13.2 L - Daily weight - Amlodipine 10 mg daily - Losartan 50 mg daily -Torsemide 40 mg daily -1/10 increase Metoprolol 25 mg BID -1/10 Lasix IV 60 mg x 1 -1/11 Lasix IV 40 mg x 1 --1/12 Laisx 40 mg x1, hold torsemide  Essential HTN - See CHF  DM type II uncontrolled with Hyperglycemia - 1/5 hemoglobin A1c= 7.8 - 1/11--transferred to ICU for IV insulin gtt --1/12--sugars still I the upper 200's --cont tradjenta  Acute on CKD stage II (baseline Cr 1.36) --creat stable  Obesity class III - See diabetes  OSA - On trilogy at home, will use CPAP while in hospital per respiratory protocol  Patient continue to be at high risk forworsening respiratory failure  Status is: Inpatient  Remains inpatient appropriate because:IV treatments appropriate due to intensity of illness or inability to take PO  DVT prophylaxis: Lovenox Code Status: Full Family Communication: 1/11 spoke with Crista Elliot (friend) counseled on plan of care answered all questions Status is: Inpatient    Dispo: The patient is from: Home  Anticipated d/c is to: Home  Anticipated d/c date is: TBD  Patient currently unstable -- requiring significant amount of oxygen with high flow and currently on insulin  drip.   TOTAL TIME TAKING CARE OF THIS PATIENT: *25* minutes.  >50% time spent on counselling and coordination of care  Note: This dictation was prepared with Dragon dictation along with smaller phrase technology. Any transcriptional errors that result from this process are unintentional.  [name] M.D    [facility]   CC: Primary care physician; [name], NPPatient ID: Fayrene Helper [place], male   DOB: Jun 10, 1965, 55 y.o.   MRN: [id number]

## 2020-02-13 DIAGNOSIS — I10 Essential (primary) hypertension: Secondary | ICD-10-CM | POA: Diagnosis not present

## 2020-02-13 DIAGNOSIS — U071 COVID-19: Secondary | ICD-10-CM | POA: Diagnosis not present

## 2020-02-13 DIAGNOSIS — E1165 Type 2 diabetes mellitus with hyperglycemia: Secondary | ICD-10-CM | POA: Diagnosis not present

## 2020-02-13 LAB — COMPREHENSIVE METABOLIC PANEL
ALT: 26 U/L (ref 0–44)
AST: 22 U/L (ref 15–41)
Albumin: 3.2 g/dL — ABNORMAL LOW (ref 3.5–5.0)
Alkaline Phosphatase: 50 U/L (ref 38–126)
Anion gap: 12 (ref 5–15)
BUN: 45 mg/dL — ABNORMAL HIGH (ref 6–20)
CO2: 30 mmol/L (ref 22–32)
Calcium: 9.3 mg/dL (ref 8.9–10.3)
Chloride: 94 mmol/L — ABNORMAL LOW (ref 98–111)
Creatinine, Ser: 1.37 mg/dL — ABNORMAL HIGH (ref 0.61–1.24)
GFR, Estimated: >60 mL/min (ref >60)
Glucose, Bld: 206 mg/dL — ABNORMAL HIGH (ref 70–99)
Potassium: 4.5 mmol/L (ref 3.5–5.1)
Sodium: 136 mmol/L (ref 135–145)
Total Bilirubin: 0.7 mg/dL (ref 0.3–1.2)
Total Protein: 7.6 g/dL (ref 6.5–8.1)

## 2020-02-13 LAB — FERRITIN: Ferritin: 435 ng/mL — ABNORMAL HIGH (ref 24–336)

## 2020-02-13 LAB — GLUCOSE, CAPILLARY
Glucose-Capillary: 235 mg/dL — ABNORMAL HIGH (ref 70–99)
Glucose-Capillary: 232 mg/dL — ABNORMAL HIGH (ref 70–99)
Glucose-Capillary: 200 mg/dL — ABNORMAL HIGH (ref 70–99)
Glucose-Capillary: 307 mg/dL — ABNORMAL HIGH (ref 70–99)
Glucose-Capillary: 352 mg/dL — ABNORMAL HIGH (ref 70–99)
Glucose-Capillary: 385 mg/dL — ABNORMAL HIGH (ref 70–99)
Glucose-Capillary: 338 mg/dL — ABNORMAL HIGH (ref 70–99)

## 2020-02-13 LAB — MAGNESIUM: Magnesium: 2.1 mg/dL (ref 1.7–2.4)

## 2020-02-13 LAB — FIBRIN DERIVATIVES D-DIMER (ARMC ONLY): Fibrin derivatives D-dimer (ARMC): 875.55 ng/mL (FEU) — ABNORMAL HIGH (ref 0.00–499.00)

## 2020-02-13 LAB — PHOSPHORUS: Phosphorus: 4.8 mg/dL — ABNORMAL HIGH (ref 2.5–4.6)

## 2020-02-13 LAB — LACTATE DEHYDROGENASE: LDH: 262 U/L — ABNORMAL HIGH (ref 98–192)

## 2020-02-13 LAB — C-REACTIVE PROTEIN: CRP: 9.7 mg/dL — ABNORMAL HIGH (ref <1.0)

## 2020-02-13 MED ORDER — FUROSEMIDE 10 MG/ML IJ SOLN
40.0000 mg | Freq: Once | INTRAMUSCULAR | Status: DC
Start: 2020-02-13 — End: 2020-02-13

## 2020-02-13 MED ORDER — FUROSEMIDE 10 MG/ML IJ SOLN
60.0000 mg | Freq: Every day | INTRAMUSCULAR | Status: DC
  Administered 2020-02-13 – 2020-02-14 (×2): 60 mg via INTRAVENOUS
  Filled 2020-02-13 (×2): qty 6

## 2020-02-13 NOTE — Progress Notes (Signed)
[place] at [place] NAME: Raymond [name]    MR#:  [id number]  DATE OF BIRTH:  March 14, 1965  SUBJECTIVE:   Remains on heated high flow nasal cannula oxygen fio2 98% and 50 liter/min  sugars fluctuating Off insulin gtt REVIEW OF SYSTEMS:   Review of Systems  Constitutional: Negative for chills, fever and weight loss.  HENT: Negative for ear discharge, ear pain and nosebleeds.   Eyes: Negative for blurred vision, pain and discharge.  Respiratory: Positive for shortness of breath. Negative for sputum production, wheezing and stridor.   Cardiovascular: Negative for chest pain, palpitations, orthopnea and PND.  Gastrointestinal: Negative for abdominal pain, diarrhea, nausea and vomiting.  Genitourinary: Negative for frequency and urgency.  Musculoskeletal: Negative for back pain and joint pain.  Neurological: Positive for weakness. Negative for sensory change, speech change and focal weakness.  Psychiatric/Behavioral: Negative for depression and hallucinations. The patient is not nervous/anxious.    Tolerating Diet:yes Tolerating PT: No PT f/u after evaluation  DRUG ALLERGIES:  No Known Allergies  VITALS:  Blood pressure 118/62, pulse 77, temperature (!) 97.5 F (36.4 C), temperature source Axillary, resp. rate (!) 22, height 6' (1.829 m), weight (!) 178.8 kg, SpO2 91 %.  PHYSICAL EXAMINATION:   Physical Exam  GENERAL:  55 y.o.-year-old patient lying in the bed with no acute distress. Morbidly obese HEENT: Head atraumatic, normocephalic. Oropharynx and nasopharynx clear. HFNC+ NECK:  Supple, no jugular venous distention. No thyroid enlargement, no tenderness.  LUNGS:shallow breath sounds bilaterally, no wheezing, rales, rhonchi. No use of accessory muscles of respiration.  CARDIOVASCULAR: S1, S2 normal. No murmurs, rubs, or gallops.  ABDOMEN: Soft, nontender, nondistended. Bowel sounds present. No organomegaly or mass. Abdominal  obesity EXTREMITIES: No cyanosis, clubbing  chronic + edema b/l.    NEUROLOGIC: Cranial nerves II through XII are intact. No focal Motor or sensory deficits b/l.   PSYCHIATRIC:  patient is alert and oriented x 3.  SKIN: No obvious rash, lesion, or ulcer.   LABORATORY PANEL:  CBC Recent Labs  Lab 02/12/20 0126  WBC 11.5*  HGB 11.3*  HCT 35.9*  PLT 364    Chemistries  Recent Labs  Lab 02/13/20 0200  NA 136  K 4.5  CL 94*  CO2 30  GLUCOSE 206*  BUN 45*  CREATININE 1.37*  CALCIUM 9.3  MG 2.1  AST 22  ALT 26  ALKPHOS 50  BILITOT 0.7   Cardiac Enzymes No results for input(s): TROPONINI in the last 168 hours. RADIOLOGY:  No results found. 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 Pt developed cough and shortness of breath. Also subjective fever. No chest pain, no pleuritic pain. No n/v/d. No abdominal pain. No dysuria. Has had 2 vaccines but no booster. Required 3 L at home, up from baseline of 2 L  Acute hypoxic respiratory failure due to SARS COVID-19 viral pneumonia.  Recent Labs (last 2 labs)        Recent Labs    02/09/20 0508 02/10/20 0508 02/11/20 0604  DDIMER 0.39 0.31  --   FERRITIN 360* 337* 369*  LDH 176 205* 258*  CRP 12.1* 19.5*  --       02/05/20 POC SARS coronavirus positive - completed Remdesivir per pharmacy -Solu-Medrol 40 mg BID - Combivent QID - Flutter valve and  Incentive spirometry - Titrate O2 to maintain SPO2> 92% --1/10 Baricitinib per pharmacy protocol.  Patient currently meeting criteria for use  Chronic Diastolic CHF - Strict in and out -13.2 L - Daily weight - Amlodipine 10 mg daily - Losartan 50 mg daily -Torsemide 40 mg daily -1/10 increase Metoprolol 25 mg BID -1/10 Lasix IV 60 mg x 1 -1/11 Lasix IV 40 mg x 1 --1/12 Laisx 40 mg x1, hold  torsemide--1/13--lasix 60 mg IV daily  Essential HTN - See CHF  DM type II uncontrolled with Hyperglycemia - 1/5 hemoglobin A1c= 7.8 - 1/11--transferred to ICU for IV insulin gtt --1/12--sugars still I the upper 200's --cont tradjenta --1/13-- now on Humulin U500 35 units bid, Novolog 50 units tid and ssi  Acute on CKD stage II (baseline Cr 1.36) --creat stable  Obesity class III - See diabetes  OSA - On trilogy at home, will use CPAP while in hospital per respiratory protocol  Patient continue to be at high risk forworsening respiratory failure  Status is: Inpatient  Remains inpatient appropriate because:IV treatments appropriate due to intensity of illness or inability to take PO  DVT prophylaxis: Lovenox Code Status: Full Family Communication: none today Status is: Inpatient    Dispo: The patient is from: Home  Anticipated d/c is to: Home  Anticipated d/c date is: TBD  Patient currently unstable -- requiring significant amount of oxygen with high flow and currently on insulin drip.   TOTAL TIME TAKING CARE OF THIS PATIENT: *25* minutes.  >50% time spent on counselling and coordination of care  Note: This dictation was prepared with Dragon dictation along with smaller phrase technology. Any transcriptional errors that result from this process are unintentional.  [name] M.D    [facility]   CC: Primary care physician; [name], NPPatient ID: Raymond [name], male   DOB: 07/23/1965, 55 y.o.   MRN: [id number]

## 2020-02-13 NOTE — Progress Notes (Signed)
35 units R U500 given at approx 2100. Insulin gtt turned off at 2300. Resuming SSI coverage q4h beginning with midnight dose.

## 2020-02-14 DIAGNOSIS — I10 Essential (primary) hypertension: Secondary | ICD-10-CM | POA: Diagnosis not present

## 2020-02-14 DIAGNOSIS — E1165 Type 2 diabetes mellitus with hyperglycemia: Secondary | ICD-10-CM | POA: Diagnosis not present

## 2020-02-14 DIAGNOSIS — U071 COVID-19: Secondary | ICD-10-CM | POA: Diagnosis not present

## 2020-02-14 LAB — GLUCOSE, CAPILLARY
Glucose-Capillary: 230 mg/dL — ABNORMAL HIGH (ref 70–99)
Glucose-Capillary: 215 mg/dL — ABNORMAL HIGH (ref 70–99)
Glucose-Capillary: 390 mg/dL — ABNORMAL HIGH (ref 70–99)
Glucose-Capillary: 476 mg/dL — ABNORMAL HIGH (ref 70–99)
Glucose-Capillary: 434 mg/dL — ABNORMAL HIGH (ref 70–99)
Glucose-Capillary: 336 mg/dL — ABNORMAL HIGH (ref 70–99)

## 2020-02-14 LAB — BASIC METABOLIC PANEL
Anion gap: 15 (ref 5–15)
BUN: 43 mg/dL — ABNORMAL HIGH (ref 6–20)
CO2: 30 mmol/L (ref 22–32)
Calcium: 9.2 mg/dL (ref 8.9–10.3)
Chloride: 90 mmol/L — ABNORMAL LOW (ref 98–111)
Creatinine, Ser: 1.49 mg/dL — ABNORMAL HIGH (ref 0.61–1.24)
GFR, Estimated: 55 mL/min — ABNORMAL LOW (ref >60)
Glucose, Bld: 452 mg/dL — ABNORMAL HIGH (ref 70–99)
Potassium: 5.3 mmol/L — ABNORMAL HIGH (ref 3.5–5.1)
Sodium: 135 mmol/L (ref 135–145)

## 2020-02-14 LAB — MAGNESIUM: Magnesium: 2.4 mg/dL (ref 1.7–2.4)

## 2020-02-14 MED ORDER — INSULIN ASPART 100 UNIT/ML ~~LOC~~ SOLN
25.0000 [IU] | Freq: Once | SUBCUTANEOUS | Status: AC
  Administered 2020-02-14: 25 [IU] via SUBCUTANEOUS

## 2020-02-14 MED ORDER — INSULIN REGULAR HUMAN (CONC) 500 UNIT/ML ~~LOC~~ SOPN
40.0000 [IU] | PEN_INJECTOR | Freq: Two times a day (BID) | SUBCUTANEOUS | Status: DC
  Administered 2020-02-14 – 2020-02-15 (×3): 40 [IU] via SUBCUTANEOUS

## 2020-02-14 MED ORDER — TORSEMIDE 20 MG PO TABS
40.0000 mg | ORAL_TABLET | Freq: Every day | ORAL | Status: DC
  Administered 2020-02-14 – 2020-02-22 (×9): 40 mg via ORAL
  Filled 2020-02-14 (×8): qty 2

## 2020-02-14 MED ORDER — ENSURE MAX PROTEIN PO LIQD
11.0000 [oz_av] | Freq: Two times a day (BID) | ORAL | Status: DC
  Administered 2020-02-15 – 2020-02-21 (×12): 11 [oz_av] via ORAL
  Administered 2020-02-22: 237 mL via ORAL
  Administered 2020-02-22: 11 [oz_av] via ORAL
  Filled 2020-02-14: qty 330

## 2020-02-14 NOTE — Progress Notes (Signed)
BG > 300 x 12h. Randol Kern, NP notified. No new orders at this time.

## 2020-02-14 NOTE — Progress Notes (Signed)
Occupational Therapy RE- Evaluation Patient Details Name: Raymond [name] MRN: [id number] DOB: 01-30-1966 Today's Date: 02/14/2020    History of Present Illness Raymond [name] is a 5yoM who comes to [facility] 02/05/20 BIBA c SOB, COVID (+) contact. Pt febrile (mildly), COVIDtest (+). PMH: CHF on 2L baseline, OSA on triology, DM2, bilat PND c orthopedic shoes, bilat toe amputations.   Clinical Impression   Patient presenting with decreased I in self care, balance, transfers/mobility, strength, endurance, and safety awareness. Pt recently transferred to CCU and recent new orders for OT re-evaluation. Pt currently on 50 L HFNC during session. Pt performing bed mobility with supervision and donning B shoes with assistance to fasten velcro. Pt standing with supervision and use of bed rail x2 then controlled sit <>stand without UE support x 3 reps with supervision. Pt ambulating 5' forwards and backwards x 2 reps without LOB with min guard.  OT educated and demonstrated use of red, level 2 theraband for B UE strengthening resistance. Pt returning demonstrations with min cuing for proper technique. Pt's O2 saturation remaining above 87% during session with standing, ambulation, and therapeutic exercises. Pt led to perform pursed lip breathing and quickly recovering into the 90s. Patient will benefit from acute OT to increase overall independence in the areas of ADLs, functional mobility, and safety awareness in order to safely discharge to next venue of care.    Follow Up Recommendations  Supervision - Intermittent;No OT follow up    Equipment Recommendations  None recommended by OT       Precautions / Restrictions Precautions Precautions: Fall Precaution Comments: orthopedic shoes in room Restrictions Weight Bearing Restrictions: No      Mobility Bed Mobility Overal bed mobility: Needs Assistance Bed Mobility: Supine to Sit;Sit to Supine     Supine to sit: Supervision;HOB  elevated Sit to supine: HOB elevated   General bed mobility comments: assist with lines and leads, supervision for safety, mildly effortful for pt but able to complete without assist    Transfers Overall transfer level: Needs assistance Equipment used: None Transfers: Sit to/from Stand Sit to Stand: Supervision Stand pivot transfers: Supervision       General transfer comment: pt able to side step at bed without assist, did utilize bed rail to stand initially    Balance Overall balance assessment: Needs assistance Sitting-balance support: Single extremity supported;No upper extremity supported;Feet supported Sitting balance-Leahy Scale: Normal     Standing balance support: During functional activity Standing balance-Leahy Scale: Fair Standing balance comment: able to side step and stand without UE support                           ADL either performed or assessed with clinical judgement   ADL Overall ADL's : Needs assistance/impaired     Grooming: Wash/dry hands;Wash/dry face;Standing;Supervision/safety               Lower Body Dressing: Minimal assistance;Sit to/from stand Lower Body Dressing Details (indicate cue type and reason): dons B shoes but needs assistance to velcro                     Vision Baseline Vision/History: No visual deficits Patient Visual Report: No change from baseline Vision Assessment?: No apparent visual deficits            Pertinent Vitals/Pain Pain Assessment: No/denies pain     Hand Dominance Right   Extremity/Trunk Assessment Upper Extremity Assessment Upper Extremity Assessment: Overall  WFL for tasks assessed   Lower Extremity Assessment Lower Extremity Assessment: Generalized weakness   Cervical / Trunk Assessment Cervical / Trunk Assessment: Normal   Communication Communication Communication: No difficulties   Cognition Arousal/Alertness: Awake/alert Behavior During Therapy: WFL for tasks  assessed/performed Overall Cognitive Status: Within Functional Limits for tasks assessed                                     General Comments  Pt on 60L on 100% fiO2 throughout session. Varied from high 55s - high 80s during mobility            Home Living Family/patient expects to be discharged to:: Private residence Living Arrangements: Non-relatives/Friends Available Help at Discharge: Available 24 hours/day;Friend(s) Type of Home: Apartment Home Access: Stairs to enter [employer] of Steps: 5 steps, no rails, no difficulty at baseline   Home Layout: One level     Bathroom Shower/Tub: Tub/shower unit         Home Equipment: None;Grab bars - tub/shower   Additional Comments: no DME at homne, no falls, no balance issues. PLOF gathered from original evaluation      Prior Functioning/Environment Level of Independence: Independent        Comments: Pt does not typically take his O2 out in the community.        OT Problem List: Decreased strength;Decreased activity tolerance;Decreased safety awareness;Impaired balance (sitting and/or standing);Decreased knowledge of use of DME or AE;Decreased knowledge of precautions;Cardiopulmonary status limiting activity      OT Treatment/Interventions: Self-care/ADL training;Balance training;Therapeutic exercise;Therapeutic activities;Energy conservation;DME and/or AE instruction;Patient/family education    OT Goals(Current goals can be found in the care plan section) Acute Rehab OT Goals Patient Stated Goal: to go home when able OT Goal Formulation: With patient Time For Goal Achievement: 02/28/20 Potential to Achieve Goals: Good  OT Frequency: Min 2X/week    AM-PAC OT "6 Clicks" Daily Activity     Outcome Measure Help from another person eating meals?: None Help from another person taking care of personal grooming?: A Little Help from another person toileting, which includes using toliet, bedpan,  or urinal?: A Little Help from another person bathing (including washing, rinsing, drying)?: A Little Help from another person to put on and taking off regular upper body clothing?: None Help from another person to put on and taking off regular lower body clothing?: A Little 6 Click Score: 20   End of Session Equipment Utilized During Treatment: Oxygen Nurse Communication: Mobility status  Activity Tolerance: Patient tolerated treatment well Patient left: in chair;with call bell/phone within reach  OT Visit Diagnosis: Muscle weakness (generalized) (M62.81)                Time: 1500-1540 OT Time Calculation (min): 40 min Charges:  OT General Charges $OT Visit: 1 Visit OT Evaluation $OT Re-eval: 1 Re-eval OT Treatments $Therapeutic Activity: 23-37 mins $Therapeutic Exercise: 8-22 mins   Darleen Crocker, MS, OTR/L , CBIS ascom [phone]  02/14/20, 4:30 PM  02/14/2020, 4:29 PM

## 2020-02-14 NOTE — Progress Notes (Signed)
[place] at [place] NAME: Raymond [name]    MR#:  [id number]  DATE OF BIRTH:  1965-12-25  SUBJECTIVE:   Remains on heated high flow nasal cannula oxygen fio2 98% and 50 liter/min  sugars fluctuating Off insulin gtt REVIEW OF SYSTEMS:   Review of Systems  Constitutional: Negative for chills, fever and weight loss.  HENT: Negative for ear discharge, ear pain and nosebleeds.   Eyes: Negative for blurred vision, pain and discharge.  Respiratory: Positive for shortness of breath. Negative for sputum production, wheezing and stridor.   Cardiovascular: Negative for chest pain, palpitations, orthopnea and PND.  Gastrointestinal: Negative for abdominal pain, diarrhea, nausea and vomiting.  Genitourinary: Negative for frequency and urgency.  Musculoskeletal: Negative for back pain and joint pain.  Neurological: Positive for weakness. Negative for sensory change, speech change and focal weakness.  Psychiatric/Behavioral: Negative for depression and hallucinations. The patient is not nervous/anxious.    Tolerating Diet:yes Tolerating PT: No PT f/u after evaluation  DRUG ALLERGIES:  No Known Allergies  VITALS:  Blood pressure 135/61, pulse 81, temperature 98.7 F (37.1 C), resp. rate (!) 29, height 6' (1.829 m), weight (!) 178.8 kg, SpO2 (!) 89 %.  PHYSICAL EXAMINATION:   Physical Exam  GENERAL:  55 y.o.-year-old patient lying in the bed with no acute distress. Morbidly obese HEENT: Head atraumatic, normocephalic. Oropharynx and nasopharynx clear. HFNC+ NECK:  Supple, no jugular venous distention. No thyroid enlargement, no tenderness.  LUNGS:shallow breath sounds bilaterally, no wheezing, rales, rhonchi. No use of accessory muscles of respiration.  CARDIOVASCULAR: S1, S2 normal. No murmurs, rubs, or gallops.  ABDOMEN: Soft, nontender, nondistended. Bowel sounds present. No organomegaly or mass. Abdominal obesity EXTREMITIES: No  cyanosis, clubbing  chronic + edema b/l.    NEUROLOGIC: Cranial nerves II through XII are intact. No focal Motor or sensory deficits b/l.   PSYCHIATRIC:  patient is alert and oriented x 3.  SKIN: No obvious rash, lesion, or ulcer.   LABORATORY PANEL:  CBC Recent Labs  Lab 02/12/20 0126  WBC 11.5*  HGB 11.3*  HCT 35.9*  PLT 364    Chemistries  Recent Labs  Lab 02/13/20 0200  NA 136  K 4.5  CL 94*  CO2 30  GLUCOSE 206*  BUN 45*  CREATININE 1.37*  CALCIUM 9.3  MG 2.1  AST 22  ALT 26  ALKPHOS 50  BILITOT 0.7   Cardiac Enzymes No results for input(s): TROPONINI in the last 168 hours. RADIOLOGY:  No results found. 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 Pt developed cough and shortness of breath. Also subjective fever. No chest pain, no pleuritic pain. No n/v/d. No abdominal pain. No dysuria. Has had 2 vaccines but no booster. Required 3 L at home, up from baseline of 2 L  Acute hypoxic respiratory failure due to SARS COVID-19 viral pneumonia. 02/05/20 POC SARS coronavirus positive - completed Remdesivir per pharmacy -Solu-Medrol 40 mg BID - Combivent QID - Flutter valve and Incentive spirometry - Titrate O2 to maintain SPO2> 92% --1/10 Baricitinib per pharmacy protocol.  Patient currently meeting criteria for use -CRP--16.9-->9.7 -- continues to remain on heated high flow nasal, oxygen. --pt has long road to recovery!  Chronic Diastolic CHF - Strict in and out -25 L - Daily weight - Amlodipine 10 mg  daily - Losartan 50 mg daily -Torsemide 40 mg daily (home) --1/12 Lasix 40 mg x1, hold torsemide --1/13--lasix 60 mg IV  --1/14 pt is -25 liters. Will stop IV lasix and resume home torsemide 40 mg qd  Essential HTN - See CHF  DM type II uncontrolled with Hyperglycemia - 1/5  hemoglobin A1c= 7.8 - 1/11--transferred to ICU for IV insulin gtt --1/12--sugars still I the upper 200's --cont tradjenta --1/13-- now on Humulin U500 35 units bid, Novolog 50 units tid and ssi --1/14--Increased to 40 units bid  Acute on CKD stage II (baseline Cr 1.36) --creat stable  Obesity class III - See diabetes  OSA - On trilogy at home, will use CPAP while in hospital per respiratory protocol  Patient continue to be at high risk forworsening respiratory failure. He has long ways to go!!  Status is: Inpatient  Remains inpatient appropriate because:IV treatments appropriate due to intensity of illness  DVT prophylaxis: Lovenox Code Status: Full Family Communication:  status is: Inpatient    Dispo: The patient is from: Home  Anticipated d/c is to: Home  Anticipated d/c date is: TBD  Patient currently unstable -- requiring significant amount of oxygen with high flow   TOTAL TIME TAKING CARE OF THIS PATIENT: *25* minutes.  >50% time spent on counselling and coordination of care  Note: This dictation was prepared with Dragon dictation along with smaller phrase technology. Any transcriptional errors that result from this process are unintentional.  [name] M.D    [facility]   CC: Primary care physician; [name], NPPatient ID: Fayrene Helper [place], male   DOB: 11-16-1965, 55 y.o.   MRN: [id number]

## 2020-02-14 NOTE — Evaluation (Signed)
Physical Therapy Re-Evaluation Patient Details Name: Raymond [name] MRN: [id number] DOB: 19-Aug-1965 Today's Date: 02/14/2020   History of Present Illness  Raymond [name] is a 15yoM who comes to [facility] 02/05/20 BIBA c SOB, COVID (+) contact. Pt febrile (mildly), COVIDtest (+). PMH: CHF on 2L baseline, OSA on triology, DM2, bilat PND c orthopedic shoes, bilat toe amputations.    Clinical Impression  Pt alert, oriented. PT entered room with RN and CNA. PLOF gathered from previous evaluation, no changes per pt. He was able to lift all extremities against gravity. Mobility maximized by care team to optimize cleanliness of pt as well as a gown change. He was able to transfer to sit EOB with supervision and use of bed rails, desaturation to high 70s noted, pt asymptomatic. With rest and cues for PLB improved to 87%. Sit <> stand with CGA/supervision x2 for safety and use of bed rail. In standing pt exhibited steadiness, and able to step laterally towards [facility] without assist. Returned to supine with supervision as well.  Overall the patient demonstrated deficits (see "PT Problem List") that impede the patient's functional abilities, safety, and mobility and would benefit from skilled PT intervention. Recommendation is outpatient PT pending pt improvement in respiratory status to maximize function, activity tolerance, and endurance.     Follow Up Recommendations Outpatient PT    Equipment Recommendations  None recommended by PT    Recommendations for Other Services       Precautions / Restrictions Precautions Precautions: Fall Precaution Comments: orthopedic shoes in room Restrictions Weight Bearing Restrictions: No      Mobility  Bed Mobility Overal bed mobility: Needs Assistance Bed Mobility: Supine to Sit;Sit to Supine     Supine to sit: Supervision;HOB elevated Sit to supine: HOB elevated   General bed mobility comments: assist with lines and leads, supervision for  safety, mildly effortful for pt but able to complete without assist    Transfers Overall transfer level: Needs assistance Equipment used: None Transfers: Sit to/from Stand Sit to Stand: Supervision         General transfer comment: pt able to side step at bed without assist, did utilize bed rail to stand initially  Ambulation/Gait             General Gait Details: deferred due to [facility] and desaturation pt has experienced today. in standing spO2 ~low 80s  Stairs            Wheelchair Mobility    Modified Rankin (Stroke Patients Only)       Balance Overall balance assessment: Needs assistance Sitting-balance support: Single extremity supported;No upper extremity supported;Feet supported Sitting balance-Leahy Scale: Normal     Standing balance support: During functional activity Standing balance-Leahy Scale: Fair Standing balance comment: able to side step and stand without UE support                             Pertinent Vitals/Pain Pain Assessment: No/denies pain    Home Living Family/patient expects to be discharged to:: Private residence Living Arrangements: Non-relatives/Friends (cousin/roommate)   Type of Home: Apartment Home Access: Stairs to enter   [employer] of Steps: 5 steps, no rails, no difficulty at baseline Home Layout: One level Home Equipment: None;Grab bars - tub/shower Additional Comments: no DME at homne, no falls, no balance issues. PLOF gathered from original evaluation    Prior Function Level of Independence: Independent  Comments: Pt does not typically take his O2 out in the community.     Hand Dominance   Dominant Hand: Right    Extremity/Trunk Assessment   Upper Extremity Assessment Upper Extremity Assessment: Overall WFL for tasks assessed    Lower Extremity Assessment Lower Extremity Assessment: Generalized weakness (able to lift against gravity, no assist)    Cervical / Trunk  Assessment Cervical / Trunk Assessment: Normal  Communication   Communication: No difficulties  Cognition Arousal/Alertness: Awake/alert Behavior During Therapy: WFL for tasks assessed/performed Overall Cognitive Status: Within Functional Limits for tasks assessed                                        General Comments General comments (skin integrity, edema, etc.): Pt on 60L on 100% fiO2 throughout session. Varied from high 20s - high 80s during mobility    Exercises     Assessment/Plan    PT Assessment Patient needs continued PT services  PT Problem List Decreased activity tolerance;Decreased balance;Decreased mobility       PT Treatment Interventions DME instruction;Balance training;Gait training;Functional mobility training;Stair training;Therapeutic activities;Therapeutic exercise;Patient/family education    PT Goals (Current goals can be found in the Care Plan section)  Acute Rehab PT Goals Patient Stated Goal: to go home when able PT Goal Formulation: With patient Time For Goal Achievement: 02/28/20 Potential to Achieve Goals: Good    Frequency Min 2X/week   Barriers to discharge        Co-evaluation               AM-PAC PT "6 Clicks" Mobility  Outcome Measure Help needed turning from your back to your side while in a flat bed without using bedrails?: None Help needed moving from lying on your back to sitting on the side of a flat bed without using bedrails?: None Help needed moving to and from a bed to a chair (including a wheelchair)?: None Help needed standing up from a chair using your arms (e.g., wheelchair or bedside chair)?: None Help needed to walk in hospital room?: A Little Help needed climbing 3-5 steps with a railing? : A Little 6 Click Score: 22    End of Session Equipment Utilized During Treatment: Oxygen (60L at 100% fiO2) Activity Tolerance: Patient tolerated treatment well Patient left: in bed;with call bell/phone  within reach Nurse Communication: Mobility status PT Visit Diagnosis: Unsteadiness on feet (R26.81);Difficulty in walking, not elsewhere classified (R26.2);Other abnormalities of gait and mobility (R26.89)    Time: 9233-0076 PT Time Calculation (min) (ACUTE ONLY): 23 min   Charges:   PT Evaluation $PT Re-evaluation: 1 Re-eval PT Treatments $Therapeutic Exercise: 23-37 mins       Lieutenant Diego PT, DPT 1:57 PM,02/14/20

## 2020-02-14 NOTE — Progress Notes (Signed)
Inpatient Diabetes Program Recommendations  AACE/ADA: New Consensus Statement on Inpatient Glycemic Control  Target Ranges:  Prepandial:   less than 140 mg/dL      Peak postprandial:   less than 180 mg/dL (1-2 hours)      Critically ill patients:  140 - 180 mg/dL   Results for Raymond [name], Raymond [name] (MRN [id number]) as of 02/14/2020 07:10  Ref. Range 02/13/2020 07:39 02/13/2020 11:29 02/13/2020 15:47 02/13/2020 19:24 02/13/2020 23:18 02/14/2020 03:11  Glucose-Capillary Latest Ref Range: 70 - 99 mg/dL 200 (H) 307 (H) 352 (H) 385 (H) 338 (H) 230 (H)   Review of Glycemic Control  Diabetes history:DM2 Outpatient Diabetes medications:Levemir 89 units BID, Humalog 60 units TID with meals, Metformin 1000 mg BID Current orders for Inpatient glycemic control: Humulin R U500 35 units BID, Novolog 0-20 units Q4H, Tradjenta 5 mg daily; Solumedrol 60 mg daily  Inpatient Diabetes Program Recommendations:    Insulin: Please consider increasing Humulin R U500 to 40 units BID.  Thanks, Barnie Alderman, RN, MSN, CDE Diabetes Coordinator Inpatient Diabetes Program [phone] (Team Pager from 8am to 5pm)

## 2020-02-14 NOTE — Progress Notes (Signed)
Nutrition Follow-up  DOCUMENTATION CODES:   Morbid obesity  INTERVENTION:  Will discontinue PROSource Plus.  Provide Ensure Max Protein po BID, each supplement provides 150 kcal and 30 grams of protein.  Provide double protein at meals.  NUTRITION DIAGNOSIS:   Increased nutrient needs related to catabolic illness (pneumonia secondary to COVID-19 virus infection) as evidenced by estimated needs.  Ongoing.  GOAL:   Patient will meet greater than or equal to 90% of their needs  Progressing.  MONITOR:   Labs,I & O's,Supplement acceptance,PO intake,Weight trends  REASON FOR ASSESSMENT:   Consult Diet education,Assessment of nutrition requirement/status (uncontrolled diabetic morbidly obese requires counseling for diabetic control and weight loss)  ASSESSMENT:   55 year old male admitted for pneumonia due to COVID-19 virus presented with cough and shortness of breath after being exposed to family member with Covid 2 days ago. Past medical history of morbid obesity, DM2, OSA on trilogy, chronic respiratory failure on 2 L home O2, and dCHF.  Patient reports his appetite is good and he is eating well at meals. He is eating 100% of his meals. Patient is ordered for PROSource Plus but is actually receiving Ensure Enlive. He reports he actually prefers the Ensure Max Protein. In the past 24 hours patient has had approximately 2410 kcal (91% minimum estimated needs) and 132 grams of protein (91% minimum estimated needs). Will change ONS to Ensure Max Protein.  Medications reviewed and include: Novolog 0-20 units Q4hrs, regular insulin 40 units BID, Tradjenta 5 mg daily, Solu-Medrol 60 mg daily IV, MVI daily, Protonix, torsemide 40 mg daily.  Labs reviewed: CBG 215-390, Chloride 94, BUN 45, Creatinine 1.37.  I/O: 4900 mL UOP Yesterday (1.1 mL/kg/hr)  No weight since 1/6 to trend  Discussed on rounds.   NUTRITION - FOCUSED PHYSICAL EXAM:  Flowsheet Row Most Recent Value  Orbital  Region No depletion  Upper Arm Region No depletion  Thoracic and Lumbar Region No depletion  Buccal Region No depletion  Temple Region No depletion  Clavicle Bone Region No depletion  Clavicle and Acromion Bone Region No depletion  Scapular Bone Region Unable to assess  Patellar Region No depletion  Anterior Thigh Region No depletion  Posterior Calf Region No depletion  Edema (RD Assessment) Mild  Hair Reviewed  Eyes Reviewed  Mouth Reviewed  Skin Reviewed  Nails Reviewed     Diet Order:   Diet Order            Diet Carb Modified Fluid consistency: Thin; Room service appropriate? Yes  Diet effective now                EDUCATION NEEDS:   Education needs have been addressed  Skin:  Skin Assessment: Reviewed RN Assessment  Last BM:  02/14/2019 - large type 4  Height:   Ht Readings from Last 1 Encounters:  02/06/20 6' (1.829 m)   Weight:   Wt Readings from Last 1 Encounters:  02/06/20 (!) 178.8 kg   Ideal Body Weight:  80.9 kg  BMI:  Body mass index is 53.46 kg/m.  Estimated Nutritional Needs:   Kcal:  1062-6948 (22-24 g/kg/AdjBW)  Protein:  145-161 (1.8-2 g/kg/IBW)  Fluid:  >2.6 L/day  Jacklynn Barnacle, MS, RD, LDN Pager number available on Amion

## 2020-02-15 DIAGNOSIS — I10 Essential (primary) hypertension: Secondary | ICD-10-CM | POA: Diagnosis not present

## 2020-02-15 DIAGNOSIS — U071 COVID-19: Secondary | ICD-10-CM | POA: Diagnosis not present

## 2020-02-15 DIAGNOSIS — E1165 Type 2 diabetes mellitus with hyperglycemia: Secondary | ICD-10-CM | POA: Diagnosis not present

## 2020-02-15 LAB — GLUCOSE, RANDOM: Glucose, Bld: 535 mg/dL (ref 70–99)

## 2020-02-15 LAB — GLUCOSE, CAPILLARY
Glucose-Capillary: 256 mg/dL — ABNORMAL HIGH (ref 70–99)
Glucose-Capillary: 326 mg/dL — ABNORMAL HIGH (ref 70–99)
Glucose-Capillary: 375 mg/dL — ABNORMAL HIGH (ref 70–99)
Glucose-Capillary: 425 mg/dL — ABNORMAL HIGH (ref 70–99)
Glucose-Capillary: 481 mg/dL — ABNORMAL HIGH (ref 70–99)
Glucose-Capillary: 506 mg/dL (ref 70–99)
Glucose-Capillary: 556 mg/dL (ref 70–99)

## 2020-02-15 LAB — BETA-HYDROXYBUTYRIC ACID: Beta-Hydroxybutyric Acid: 0.16 mmol/L (ref 0.05–0.27)

## 2020-02-15 LAB — POTASSIUM: Potassium: 5 mmol/L (ref 3.5–5.1)

## 2020-02-15 MED ORDER — PREDNISONE 20 MG PO TABS
50.0000 mg | ORAL_TABLET | Freq: Every day | ORAL | Status: DC
  Administered 2020-02-16: 50 mg via ORAL
  Filled 2020-02-15 (×2): qty 1

## 2020-02-15 MED ORDER — PREDNISONE 20 MG PO TABS
30.0000 mg | ORAL_TABLET | Freq: Every day | ORAL | Status: AC
  Administered 2020-02-18: 30 mg via ORAL
  Filled 2020-02-15: qty 1

## 2020-02-15 MED ORDER — PREDNISONE 10 MG PO TABS
10.0000 mg | ORAL_TABLET | Freq: Every day | ORAL | Status: AC
  Administered 2020-02-20: 10 mg via ORAL
  Filled 2020-02-15: qty 1

## 2020-02-15 MED ORDER — PREDNISONE 20 MG PO TABS: 20.0000 mg | ORAL_TABLET | Freq: Every day | ORAL | Status: DC

## 2020-02-15 MED ORDER — INSULIN REGULAR HUMAN (CONC) 500 UNIT/ML ~~LOC~~ SOPN
45.0000 [IU] | PEN_INJECTOR | Freq: Two times a day (BID) | SUBCUTANEOUS | Status: DC
  Administered 2020-02-15 – 2020-02-16 (×2): 45 [IU] via SUBCUTANEOUS

## 2020-02-15 MED ORDER — SODIUM CHLORIDE 0.45 % IV SOLN: INTRAVENOUS | Status: DC

## 2020-02-15 MED ORDER — INSULIN ASPART 100 UNIT/ML ~~LOC~~ SOLN
35.0000 [IU] | Freq: Once | SUBCUTANEOUS | Status: AC
  Administered 2020-02-15: 35 [IU] via SUBCUTANEOUS
  Filled 2020-02-15: qty 1

## 2020-02-15 MED ORDER — SODIUM CHLORIDE 0.9 % IV BOLUS
500.0000 mL | Freq: Once | INTRAVENOUS | Status: AC
  Administered 2020-02-15: 500 mL via INTRAVENOUS

## 2020-02-15 MED ORDER — PREDNISONE 20 MG PO TABS
40.0000 mg | ORAL_TABLET | Freq: Every day | ORAL | Status: AC
  Administered 2020-02-17: 40 mg via ORAL
  Filled 2020-02-15: qty 2

## 2020-02-15 NOTE — Progress Notes (Signed)
Pt up to chair from ~1300 - 1900. RT weaning O2 throughout day. Pt tolerating very well. No complaints at this time.

## 2020-02-15 NOTE — Progress Notes (Signed)
[place] at [place] NAME: Raymond [name]    MR#:  [id number]  DATE OF BIRTH:  08-08-1965  SUBJECTIVE:   Remains on heated high flow nasal cannula oxygen fio2 60% and 50 liter/min  sugars fluctuating Off insulin gtt Eats all his meal 100%  REVIEW OF SYSTEMS:   Review of Systems  Constitutional: Negative for chills, fever and weight loss.  HENT: Negative for ear discharge, ear pain and nosebleeds.   Eyes: Negative for blurred vision, pain and discharge.  Respiratory: Positive for shortness of breath. Negative for sputum production, wheezing and stridor.   Cardiovascular: Negative for chest pain, palpitations, orthopnea and PND.  Gastrointestinal: Negative for abdominal pain, diarrhea, nausea and vomiting.  Genitourinary: Negative for frequency and urgency.  Musculoskeletal: Negative for back pain and joint pain.  Neurological: Positive for weakness. Negative for sensory change, speech change and focal weakness.  Psychiatric/Behavioral: Negative for depression and hallucinations. The patient is not nervous/anxious.    Tolerating Diet:yes Tolerating PT: No PT f/u after evaluation  DRUG ALLERGIES:  No Known Allergies  VITALS:  Blood pressure 130/68, pulse 74, temperature 98.3 F (36.8 C), temperature source Oral, resp. rate 16, height 6' (1.829 m), weight (!) 178.8 kg, SpO2 92 %.  PHYSICAL EXAMINATION:   Physical Exam  GENERAL:  55 y.o.-year-old patient lying in the bed with no acute distress. Morbidly obese HEENT: Head atraumatic, normocephalic. Oropharynx and nasopharynx clear. HFNC+ NECK:  Supple, no jugular venous distention. No thyroid enlargement, no tenderness.  LUNGS:shallow breath sounds bilaterally, no wheezing, rales, rhonchi. No use of accessory muscles of respiration.  CARDIOVASCULAR: S1, S2 normal. No murmurs, rubs, or gallops.  ABDOMEN: Soft, nontender, nondistended. Bowel sounds present. No organomegaly or mass.  Abdominal obesity EXTREMITIES: No cyanosis, clubbing  chronic + edema b/l.    NEUROLOGIC: Cranial nerves II through XII are intact. No focal Motor or sensory deficits b/l.   PSYCHIATRIC:  patient is alert and oriented x 3.  SKIN: No obvious rash, lesion, or ulcer.   LABORATORY PANEL:  CBC Recent Labs  Lab 02/12/20 0126  WBC 11.5*  HGB 11.3*  HCT 35.9*  PLT 364    Chemistries  Recent Labs  Lab 02/13/20 0200 02/14/20 2139 02/15/20 1117  NA 136 135  --   K 4.5 5.3* 5.0  CL 94* 90*  --   CO2 30 30  --   GLUCOSE 206* 452*  --   BUN 45* 43*  --   CREATININE 1.37* 1.49*  --   CALCIUM 9.3 9.2  --   MG 2.1 2.4  --   AST 22  --   --   ALT 26  --   --   ALKPHOS 50  --   --   BILITOT 0.7  --   --    Cardiac Enzymes No results for input(s): TROPONINI in the last 168 hours. RADIOLOGY:  No results found. 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 Pt developed cough and shortness of breath. Also subjective fever. No chest pain, no pleuritic pain. No n/v/d. No abdominal pain. No dysuria. Has had 2 vaccines but no booster. Required 3 L at home, up from baseline of 2 L  Acute hypoxic respiratory failure due to SARS COVID-19 viral pneumonia. 02/05/20 POC SARS coronavirus positive - completed Remdesivir  per pharmacy -Solu-Medrol 40 mg BID---po prednisone taper - Combivent QID - Flutter valve and Incentive spirometry - Titrate O2 to maintain SPO2> 92% --1/10 Baricitinib per pharmacy protocol.  Patient currently meeting criteria for use -CRP--16.9-->9.7 -- continues to remain on heated high flow nasal, oxygen. D/w RT and RN to cont weaning keep sats >85% --pt has long road to recovery! --OOB to chair --PT recommends out pt PT  Chronic Diastolic CHF - Strict in and out -22 L - Daily weight - Amlodipine 10  mg daily - Losartan 50 mg daily -Torsemide 40 mg daily (home) --1/12 Lasix 40 mg x1, hold torsemide --1/13--lasix 60 mg IV  --1/14 pt is -25 liters. Will stop IV lasix and resume home torsemide 40 mg qd  Essential HTN - See CHF  DM type II uncontrolled with Hyperglycemia - 1/5 hemoglobin A1c= 7.8 - 1/11--transferred to ICU for IV insulin gtt --1/12--sugars still I the upper 200's --cont tradjenta --1/13-- now on Humulin U500 35 units bid, Novolog 50 units tid and ssi --1/14--Increased to 40 units bid --1/15--increased U 500 to 45 units bid per Diab RN rec  Acute on CKD stage II (baseline Cr 1.36) --creat stable  Obesity class III - See diabetes  OSA - On trilogy at home, will use CPAP while in hospital per respiratory protocol  Patient continue to be at high risk forworsening respiratory failure. He has long ways to go!!  Status is: Inpatient  Remains inpatient appropriate because:IV treatments appropriate due to intensity of illness  DVT prophylaxis: Lovenox Code Status: Full Family Communication:  status is: Inpatient    Dispo: The patient is from: Home  Anticipated d/c is to: Home  Anticipated d/c date is: TBD  Patient currently unstable -- requiring significant amount of oxygen with high flow   TOTAL TIME TAKING CARE OF THIS PATIENT: *25* minutes.  >50% time spent on counselling and coordination of care  Note: This dictation was prepared with Dragon dictation along with smaller phrase technology. Any transcriptional errors that result from this process are unintentional.  [name] M.D    [facility]   CC: Primary care physician; [name], NPPatient ID: Raymond [name], male   DOB: Dec 09, 1965, 55 y.o.   MRN: [id number]

## 2020-02-15 NOTE — Progress Notes (Signed)
Inpatient Diabetes Program Recommendations  AACE/ADA: New Consensus Statement on Inpatient Glycemic Control (2015)  Target Ranges:  Prepandial:   less than 140 mg/dL      Peak postprandial:   less than 180 mg/dL (1-2 hours)      Critically ill patients:  140 - 180 mg/dL   Lab Results  Component Value Date   GLUCAP 375 (H) 02/15/2020   HGBA1C 7.8 (H) 02/05/2020    Review of Glycemic Control  Diabetes history: DM2 Outpatient Diabetes medications: Levemir 89 units BID, Humalog 60 units TID with meals, metformin 1000 mg BID Current orders for Inpatient glycemic control: Humulin R U500 40 units BID, Novolog 0-20 units Q4H, tradjenta 5 mg QD, Solumedrol 60 mg QD  Inpatient Diabetes Program Recommendations:     Consider increasing Humulin R U500 to 45 units BID.  Thank you. Lorenda Peck, RD, LDN, CDE Inpatient Diabetes Coordinator [phone]

## 2020-02-15 NOTE — Progress Notes (Signed)
Reached out to NP, Hassan Rowan concerning IV rate of fluids considering patient's CHF and his lab values not being very different from previous labs.

## 2020-02-16 DIAGNOSIS — I10 Essential (primary) hypertension: Secondary | ICD-10-CM | POA: Diagnosis not present

## 2020-02-16 DIAGNOSIS — E1165 Type 2 diabetes mellitus with hyperglycemia: Secondary | ICD-10-CM | POA: Diagnosis not present

## 2020-02-16 DIAGNOSIS — U071 COVID-19: Secondary | ICD-10-CM | POA: Diagnosis not present

## 2020-02-16 LAB — GLUCOSE, CAPILLARY
Glucose-Capillary: 239 mg/dL — ABNORMAL HIGH (ref 70–99)
Glucose-Capillary: 292 mg/dL — ABNORMAL HIGH (ref 70–99)
Glucose-Capillary: 304 mg/dL — ABNORMAL HIGH (ref 70–99)
Glucose-Capillary: 338 mg/dL — ABNORMAL HIGH (ref 70–99)
Glucose-Capillary: 347 mg/dL — ABNORMAL HIGH (ref 70–99)
Glucose-Capillary: 356 mg/dL — ABNORMAL HIGH (ref 70–99)
Glucose-Capillary: 369 mg/dL — ABNORMAL HIGH (ref 70–99)

## 2020-02-16 LAB — COMPREHENSIVE METABOLIC PANEL
ALT: 28 U/L (ref 0–44)
AST: 18 U/L (ref 15–41)
Albumin: 3.2 g/dL — ABNORMAL LOW (ref 3.5–5.0)
Alkaline Phosphatase: 57 U/L (ref 38–126)
Anion gap: 13 (ref 5–15)
BUN: 45 mg/dL — ABNORMAL HIGH (ref 6–20)
CO2: 32 mmol/L (ref 22–32)
Calcium: 9.4 mg/dL (ref 8.9–10.3)
Chloride: 91 mmol/L — ABNORMAL LOW (ref 98–111)
Creatinine, Ser: 1.33 mg/dL — ABNORMAL HIGH (ref 0.61–1.24)
GFR, Estimated: >60 mL/min (ref >60)
Glucose, Bld: 357 mg/dL — ABNORMAL HIGH (ref 70–99)
Potassium: 4.5 mmol/L (ref 3.5–5.1)
Sodium: 136 mmol/L (ref 135–145)
Total Bilirubin: 0.7 mg/dL (ref 0.3–1.2)
Total Protein: 7.9 g/dL (ref 6.5–8.1)

## 2020-02-16 LAB — BRAIN NATRIURETIC PEPTIDE: B Natriuretic Peptide: 23.2 pg/mL (ref 0.0–100.0)

## 2020-02-16 LAB — MAGNESIUM: Magnesium: 2.4 mg/dL (ref 1.7–2.4)

## 2020-02-16 MED ORDER — INSULIN ASPART 100 UNIT/ML ~~LOC~~ SOLN
0.0000 [IU] | SUBCUTANEOUS | Status: DC
  Administered 2020-02-16: 15 [IU] via SUBCUTANEOUS
  Administered 2020-02-16: 20 [IU] via SUBCUTANEOUS
  Administered 2020-02-16: 15 [IU] via SUBCUTANEOUS
  Filled 2020-02-16 (×3): qty 1

## 2020-02-16 MED ORDER — INSULIN ASPART 100 UNIT/ML ~~LOC~~ SOLN
0.0000 [IU] | Freq: Three times a day (TID) | SUBCUTANEOUS | Status: DC
  Administered 2020-02-16: 20 [IU] via SUBCUTANEOUS
  Administered 2020-02-16: 11 [IU] via SUBCUTANEOUS
  Administered 2020-02-16: 15 [IU] via SUBCUTANEOUS
  Administered 2020-02-17: 7 [IU] via SUBCUTANEOUS
  Administered 2020-02-17: 25 [IU] via SUBCUTANEOUS
  Administered 2020-02-17: 20 [IU] via SUBCUTANEOUS
  Filled 2020-02-16 (×7): qty 1

## 2020-02-16 MED ORDER — INSULIN REGULAR HUMAN (CONC) 500 UNIT/ML ~~LOC~~ SOPN
50.0000 [IU] | PEN_INJECTOR | Freq: Two times a day (BID) | SUBCUTANEOUS | Status: DC
  Administered 2020-02-16 – 2020-02-17 (×2): 50 [IU] via SUBCUTANEOUS

## 2020-02-16 NOTE — Progress Notes (Signed)
[place] at [place] NAME: Raymond [name]    MR#:  [id number]  DATE OF BIRTH:  May 25, 1965  SUBJECTIVE:   Remains on heated high flow nasal cannula oxygen fio2 46% and 50 liter/min  sugars fluctuating Eats all his meal 100%  Sat I nt he chair for several hours yday.  REVIEW OF SYSTEMS:   Review of Systems  Constitutional: Negative for chills, fever and weight loss.  HENT: Negative for ear discharge, ear pain and nosebleeds.   Eyes: Negative for blurred vision, pain and discharge.  Respiratory: Positive for shortness of breath. Negative for sputum production, wheezing and stridor.   Cardiovascular: Negative for chest pain, palpitations, orthopnea and PND.  Gastrointestinal: Negative for abdominal pain, diarrhea, nausea and vomiting.  Genitourinary: Negative for frequency and urgency.  Musculoskeletal: Negative for back pain and joint pain.  Neurological: Positive for weakness. Negative for sensory change, speech change and focal weakness.  Psychiatric/Behavioral: Negative for depression and hallucinations. The patient is not nervous/anxious.    Tolerating Diet:yes Tolerating PT: No PT f/u after evaluation  DRUG ALLERGIES:  No Known Allergies  VITALS:  Blood pressure (!) 144/82, pulse 67, temperature 98.9 F (37.2 C), temperature source Axillary, resp. rate (!) 23, height 6' (1.829 m), weight (!) 178.8 kg, SpO2 92 %.  PHYSICAL EXAMINATION:   Physical Exam  GENERAL:  55 y.o.-year-old patient lying in the bed with no acute distress. Morbidly obese HEENT: Head atraumatic, normocephalic. Oropharynx and nasopharynx clear. HFNC+ NECK:  Supple, no jugular venous distention. No thyroid enlargement, no tenderness.  LUNGS:shallow breath sounds bilaterally, no wheezing, rales, rhonchi. No use of accessory muscles of respiration.  CARDIOVASCULAR: S1, S2 normal. No murmurs, rubs, or gallops.  ABDOMEN: Soft, nontender, nondistended. Bowel  sounds present. No organomegaly or mass. Abdominal obesity EXTREMITIES: No cyanosis, clubbing  chronic + edema b/l.    NEUROLOGIC: Cranial nerves II through XII are intact. No focal Motor or sensory deficits b/l.   PSYCHIATRIC:  patient is alert and oriented x 3.  SKIN: No obvious rash, lesion, or ulcer.   LABORATORY PANEL:  CBC Recent Labs  Lab 02/12/20 0126  WBC 11.5*  HGB 11.3*  HCT 35.9*  PLT 364    Chemistries  Recent Labs  Lab 02/16/20 0249  NA 136  K 4.5  CL 91*  CO2 32  GLUCOSE 357*  BUN 45*  CREATININE 1.33*  CALCIUM 9.4  MG 2.4  AST 18  ALT 28  ALKPHOS 57  BILITOT 0.7   Cardiac Enzymes No results for input(s): TROPONINI in the last 168 hours. RADIOLOGY:  No results found. 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 Pt developed cough and shortness of breath. Also subjective fever. No chest pain, no pleuritic pain. No n/v/d. No abdominal pain. No dysuria. Has had 2 vaccines but no booster. Required 3 L at home, up from baseline of 2 L  Acute hypoxic respiratory failure due to SARS COVID-19 viral pneumonia. 02/05/20 POC SARS coronavirus positive - completed Remdesivir per pharmacy -Solu-Medrol 40 mg BID---now po prednisone taper - Combivent QID - Flutter valve and Incentive spirometry - Titrate O2 to maintain SPO2> 92% --1/10 Baricitinib per pharmacy protocol.  Patient currently meeting criteria for use -CRP--16.9-->9.7 -- continues to remain on heated high flow nasal, oxygen. D/w RT and RN to  cont weaning keep sats >85% --pt has long road to recovery! --OOB to chair --PT recommends out pt PT  Chronic Diastolic CHF - Strict in and out -25 L - Daily weight - Amlodipine 10 mg daily - Losartan 50 mg daily -Torsemide 40 mg daily (home) --1/12 Lasix 40 mg x1, hold  torsemide --1/13--lasix 60 mg IV  --1/14 pt is -25 liters. Will stop IV lasix and resume home torsemide 40 mg qd  Essential HTN -cont home meds  DM type II uncontrolled with Hyperglycemia - 1/5 hemoglobin A1c= 7.8 - 1/11--transferred to ICU for IV insulin gtt --1/12--sugars still I the upper 200's --cont tradjenta --1/13-- now on Humulin U500 35 units bid, Novolog 50 units tid and ssi --1/14--Increased to 40 units bid --1/15--increased U 500 to 45 units bid per Diab RN rec --1/16--U 500 50 units bid  Acute on CKD stage II (baseline Cr 1.36) --creat stable  Obesity class III - See diabetes  OSA - On trilogy at home, will use CPAP while in hospital per respiratory protocol  Patient continue to be at high risk forworsening respiratory failure. He has long ways to go!!  Status is: Inpatient  Remains inpatient appropriate because:IV treatments appropriate due to intensity of illness  DVT prophylaxis: Lovenox Code Status: Full Family Communication:  status is: Inpatient    Dispo: The patient is from: Home  Anticipated d/c is to: Home  Anticipated d/c date is: TBD  Patient currently unstable -- requiring significant amount of oxygen with high flow--weaning down slowly   TOTAL TIME TAKING CARE OF THIS PATIENT: *25* minutes.  >50% time spent on counselling and coordination of care  Note: This dictation was prepared with Dragon dictation along with smaller phrase technology. Any transcriptional errors that result from this process are unintentional.  [name] M.D    [facility]   CC: Primary care physician; [name], NPPatient ID: Raymond [name], male   DOB: 1965/11/17, 55 y.o.   MRN: [id number]

## 2020-02-16 NOTE — Plan of Care (Signed)

## 2020-02-17 LAB — GLUCOSE, CAPILLARY
Glucose-Capillary: 210 mg/dL — ABNORMAL HIGH (ref 70–99)
Glucose-Capillary: 225 mg/dL — ABNORMAL HIGH (ref 70–99)
Glucose-Capillary: 366 mg/dL — ABNORMAL HIGH (ref 70–99)
Glucose-Capillary: 407 mg/dL — ABNORMAL HIGH (ref 70–99)
Glucose-Capillary: 450 mg/dL — ABNORMAL HIGH (ref 70–99)

## 2020-02-17 MED ORDER — INSULIN REGULAR HUMAN (CONC) 500 UNIT/ML ~~LOC~~ SOPN
55.0000 [IU] | PEN_INJECTOR | Freq: Two times a day (BID) | SUBCUTANEOUS | Status: DC
  Administered 2020-02-17: 55 [IU] via SUBCUTANEOUS
  Filled 2020-02-17: qty 3

## 2020-02-17 MED ORDER — INSULIN ASPART 100 UNIT/ML ~~LOC~~ SOLN
25.0000 [IU] | Freq: Once | SUBCUTANEOUS | Status: AC
  Administered 2020-02-17: 25 [IU] via SUBCUTANEOUS

## 2020-02-17 NOTE — Progress Notes (Signed)
[place] [facility] [place] NAME: Raymond [name]    MR#:  [id number]  DATE OF BIRTH:  09-26-1965  SUBJECTIVE:   Remains on heated high flow nasal cannula oxygen fio2 50% and 40 liter/min  sugars fluctuating Eats all his meal 100%  Transferring out of ICU  REVIEW OF SYSTEMS:   Review of Systems  Constitutional: Negative for chills, fever and weight loss.  HENT: Negative for ear discharge, ear pain and nosebleeds.   Eyes: Negative for blurred vision, pain and discharge.  Respiratory: Positive for shortness of breath. Negative for sputum production, wheezing and stridor.   Cardiovascular: Negative for chest pain, palpitations, orthopnea and PND.  Gastrointestinal: Negative for abdominal pain, diarrhea, nausea and vomiting.  Genitourinary: Negative for frequency and urgency.  Musculoskeletal: Negative for back pain and joint pain.  Neurological: Positive for weakness. Negative for sensory change, speech change and focal weakness.  Psychiatric/Behavioral: Negative for depression and hallucinations. The patient is not nervous/anxious.    Tolerating Diet:yes Tolerating PT: No PT f/u after evaluation  DRUG ALLERGIES:  No Known Allergies  VITALS:  Blood pressure (!) 101/59, pulse 86, temperature 98.1 F (36.7 C), temperature source Oral, resp. rate (!) 22, height 6' (1.829 m), weight (!) 178.8 kg, SpO2 96 %.  PHYSICAL EXAMINATION:   Physical Exam  GENERAL:  55 y.o.-year-old patient lying in the bed with no acute distress. Morbidly obese HEENT: Head atraumatic, normocephalic. Oropharynx and nasopharynx clear. HFNC+ NECK:  Supple, no jugular venous distention. No thyroid enlargement, no tenderness.  LUNGS:shallow breath sounds bilaterally, no wheezing, rales, rhonchi. No use of accessory muscles of respiration.  CARDIOVASCULAR: S1, S2 normal. No murmurs, rubs, or gallops.  ABDOMEN: Soft, nontender, nondistended. Bowel sounds present. No  organomegaly or mass. Abdominal obesity EXTREMITIES: No cyanosis, clubbing  chronic + edema b/l.    NEUROLOGIC: Cranial nerves II through XII are intact. No focal Motor or sensory deficits b/l.   PSYCHIATRIC:  patient is alert and oriented x 3.  SKIN: No obvious rash, lesion, or ulcer.   LABORATORY PANEL:  CBC Recent Labs  Lab 02/12/20 0126  WBC 11.5*  HGB 11.3*  HCT 35.9*  PLT 364    Chemistries  Recent Labs  Lab 02/16/20 0249  NA 136  K 4.5  CL 91*  CO2 32  GLUCOSE 357*  BUN 45*  CREATININE 1.33*  CALCIUM 9.4  MG 2.4  AST 18  ALT 28  ALKPHOS 57  BILITOT 0.7   Cardiac Enzymes No results for input(s): TROPONINI in the last 168 hours. RADIOLOGY:  No results found. 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 Pt developed cough and shortness of breath. Also subjective fever. No chest pain, no pleuritic pain. No n/v/d. No abdominal pain. No dysuria. Has had 2 vaccines but no booster. Required 3 L at home, up from baseline of 2 L  Acute hypoxic respiratory failure due to SARS COVID-19 viral pneumonia. 02/05/20 POC SARS coronavirus positive - completed Remdesivir per pharmacy -Solu-Medrol 40 mg BID---now po prednisone taper - Combivent QID - Flutter valve and Incentive spirometry - Titrate O2 to maintain SPO2> 92% --1/10 Baricitinib per pharmacy protocol.  Patient currently meeting criteria for use -CRP--16.9-->9.7 -- continues to remain on heated high flow nasal, oxygen. D/w RT and RN to cont weaning keep sats >85% --  pt has long road to recovery! --OOB to chair --PT recommends out pt PT  Chronic Diastolic CHF - Strict in and out -25 L - Daily weight - Amlodipine 10 mg daily - Losartan 50 mg daily -Torsemide 40 mg daily (home) --1/12 Lasix 40 mg x1, hold torsemide --1/13--lasix 60 mg IV   --1/14 pt is -25 liters. Will stop IV lasix and resume home torsemide 40 mg qd  Essential HTN -cont home meds  DM type II uncontrolled with Hyperglycemia - 1/5 hemoglobin A1c= 7.8 - 1/11--transferred to ICU for IV insulin gtt --1/12--sugars still I the upper 200's --cont tradjenta --1/13-- now on Humulin U500 35 units bid, Novolog 50 units tid and ssi --1/14--Increased to 40 units bid --1/15--increased U 500 to 45 units bid per Diab RN rec --1/16--U 500 50 units bid --1/17-- U 500 55 units bid  Acute on CKD stage II (baseline Cr 1.36) --creat stable  Obesity class III - See diabetes  OSA - On trilogy at home, will use CPAP while in hospital per respiratory protocol  Patient continue to be at high risk forworsening respiratory failure. He has long ways to go!!  Status is: Inpatient  Remains inpatient appropriate because:IV treatments appropriate due to intensity of illness  DVT prophylaxis: Lovenox Code Status: Full Family Communication:  status is: Inpatient    Dispo: The patient is from: Home  Anticipated d/c is to: Home  Anticipated d/c date is: TBD  Patient currently unstable -- requiring significant amount of oxygen with high flow--weaning down slowly   TOTAL TIME TAKING CARE OF THIS PATIENT: *25* minutes.  >50% time spent on counselling and coordination of care  Note: This dictation was prepared with Dragon dictation along with smaller phrase technology. Any transcriptional errors that result from this process are unintentional.  [name] M.D    [facility]   CC: Primary care physician; [name], NPPatient ID: Raymond [name], male   DOB: 1965/03/23, 55 y.o.   MRN: [id number]

## 2020-02-17 NOTE — Progress Notes (Signed)
Inpatient Diabetes Program Recommendations  AACE/ADA: New Consensus Statement on Inpatient Glycemic Control (2015)  Target Ranges:  Prepandial:   less than 140 mg/dL      Peak postprandial:   less than 180 mg/dL (1-2 hours)      Critically ill patients:  140 - 180 mg/dL   Results for Raymond [name], Raymond [name] (MRN [id number]) as of 02/17/2020 13:19  Ref. Range 02/16/2020 07:34 02/16/2020 11:07 02/16/2020 16:12 02/16/2020 19:50 02/16/2020 23:57  Glucose-Capillary Latest Ref Range: 70 - 99 mg/dL 304 (H) 292 (H) 347 (H) 369 (H) 239 (H)   Results for Raymond [name], Raymond [name] [facility] (MRN [id number]) as of 02/17/2020 13:19  Ref. Range 02/17/2020 07:58 02/17/2020 11:24  Glucose-Capillary Latest Ref Range: 70 - 99 mg/dL 225 (H) 366 (H)    Outpatient Diabetes meds:Levemir 89 units BID               Humalog 60 units TID with meals              Metformin 1000 mg BID  Current orders: Humulin R U500 Insulin 50 units BID      Novolog 0-20 units AC/HS      Tradjenta 5 mg daily     MD- Note patient switched to Prednisone taper.  Getting Concentrated U500 Insulin instead of home Levemir and Humalog due to extremely high insulin requirements at this time.  Note U500 Insulin increased to 50 units BID last PM.  CBGs remain >200 today.  Please consider continuing the U500 Insulin for today and may be able to transition back to Levemir and Novolog as steroids are reduced  Please consider increasing the U500 Insulin to 55 units BID    --Will follow patient during hospitalization--  Wyn Quaker RN, MSN, CDE Diabetes Coordinator Inpatient Glycemic Control Team Team Pager: [phone] (8a-5p)

## 2020-02-17 NOTE — Progress Notes (Signed)
PT Cancellation Note  Patient Details Name: Raymond [name] MRN: [id number] DOB: 11-Sep-1965   Cancelled Treatment:    Reason Eval/Treat Not Completed: Other (comment). Pt with recent transfer to 32 from [place]. PT entered room, pt comfortable in bed on HHFNC on 45L. Of note, telemetry box missing oxygen cord and malfunctioning HR monitor, PT attempted to address (oxygen cord obtained) but ultimately unable to correct. RN notified of equipment needs and pt status. PT to re-attempt as able.   Lieutenant Diego PT, DPT 2:34 PM,02/17/20

## 2020-02-18 LAB — BASIC METABOLIC PANEL
Anion gap: 13 (ref 5–15)
BUN: 59 mg/dL — ABNORMAL HIGH (ref 6–20)
CO2: 27 mmol/L (ref 22–32)
Calcium: 9.2 mg/dL (ref 8.9–10.3)
Chloride: 88 mmol/L — ABNORMAL LOW (ref 98–111)
Creatinine, Ser: 1.62 mg/dL — ABNORMAL HIGH (ref 0.61–1.24)
GFR, Estimated: 50 mL/min — ABNORMAL LOW (ref >60)
Glucose, Bld: 521 mg/dL (ref 70–99)
Potassium: 5.5 mmol/L — ABNORMAL HIGH (ref 3.5–5.1)
Sodium: 128 mmol/L — ABNORMAL LOW (ref 135–145)

## 2020-02-18 LAB — GLUCOSE, CAPILLARY
Glucose-Capillary: 399 mg/dL — ABNORMAL HIGH (ref 70–99)
Glucose-Capillary: 336 mg/dL — ABNORMAL HIGH (ref 70–99)
Glucose-Capillary: 325 mg/dL — ABNORMAL HIGH (ref 70–99)
Glucose-Capillary: 360 mg/dL — ABNORMAL HIGH (ref 70–99)
Glucose-Capillary: 561 mg/dL (ref 70–99)
Glucose-Capillary: 535 mg/dL (ref 70–99)

## 2020-02-18 MED ORDER — INSULIN ASPART 100 UNIT/ML ~~LOC~~ SOLN
30.0000 [IU] | Freq: Once | SUBCUTANEOUS | Status: AC
  Administered 2020-02-18: 30 [IU] via SUBCUTANEOUS
  Filled 2020-02-18: qty 1

## 2020-02-18 MED ORDER — INSULIN ASPART 100 UNIT/ML ~~LOC~~ SOLN
0.0000 [IU] | SUBCUTANEOUS | Status: DC
  Administered 2020-02-18 (×2): 15 [IU] via SUBCUTANEOUS
  Filled 2020-02-18 (×2): qty 1

## 2020-02-18 MED ORDER — INSULIN REGULAR HUMAN (CONC) 500 UNIT/ML ~~LOC~~ SOPN
55.0000 [IU] | PEN_INJECTOR | Freq: Three times a day (TID) | SUBCUTANEOUS | Status: DC
  Administered 2020-02-18 (×2): 55 [IU] via SUBCUTANEOUS

## 2020-02-18 MED ORDER — INSULIN ASPART 100 UNIT/ML ~~LOC~~ SOLN
0.0000 [IU] | Freq: Three times a day (TID) | SUBCUTANEOUS | Status: DC
  Administered 2020-02-18: 30 [IU] via SUBCUTANEOUS
  Administered 2020-02-19: 11 [IU] via SUBCUTANEOUS
  Administered 2020-02-19: 15 [IU] via SUBCUTANEOUS
  Administered 2020-02-19 (×2): 20 [IU] via SUBCUTANEOUS
  Administered 2020-02-20: 15 [IU] via SUBCUTANEOUS
  Administered 2020-02-20: 20 [IU] via SUBCUTANEOUS
  Administered 2020-02-20: 11 [IU] via SUBCUTANEOUS
  Administered 2020-02-20 – 2020-02-21 (×2): 7 [IU] via SUBCUTANEOUS
  Administered 2020-02-21 (×3): 11 [IU] via SUBCUTANEOUS
  Administered 2020-02-22: 7 [IU] via SUBCUTANEOUS
  Administered 2020-02-22: 3 [IU] via SUBCUTANEOUS
  Administered 2020-02-22: 20 [IU] via SUBCUTANEOUS
  Administered 2020-02-22: 7 [IU] via SUBCUTANEOUS
  Filled 2020-02-18 (×18): qty 1

## 2020-02-18 NOTE — Progress Notes (Signed)
Physical Therapy Treatment Patient Details Name: Raymond [name] MRN: [id number] DOB: 10-26-1965 Today's Date: 02/18/2020    History of Present Illness Raymond [name] is a 44yoM who comes to [facility] 02/05/20 BIBA c SOB, COVID (+) contact. Pt febrile (mildly), COVIDtest (+). PMH: CHF on 2L baseline, OSA on triology, DM2, bilat PND c orthopedic shoes, bilat toe amputations.    PT Comments    Pt agrees to session  15 lpm HFNC upon arrival to room stats 92%.  Bed mobility, standing and marching at bedside with no physical assist.  Gait limited by HFNC.  Sats monitored during marching and did dip down to 81%.   He stood for about 5 minutes with no difficulty.  Education attempted in regards to O2 needs, O2 readings, energy conservation and affects of Covid and recovery.  Pt not interested in education and stated if I continued to try to change his mind on what he thinks he needs there would be a confrontation.  Explained to pt my role and he said he did not care what we know and have learned about Covid.  Stated he has had his body for 56 years and knows what is going on.  While pt was generally pleasant further pushing would have agiatated him and session was stopped at that time.  Needs met upon leaving room and pt sitting EOB eating lunch.     Follow Up Recommendations  Outpatient PT     Equipment Recommendations  None recommended by PT    Recommendations for Other Services       Precautions / Restrictions Precautions Precautions: Fall Precaution Comments: orthopedic shoes in room Restrictions Weight Bearing Restrictions: No    Mobility  Bed Mobility Overal bed mobility: Modified Independent                Transfers Overall transfer level: Modified independent Equipment used: None                Ambulation/Gait             General Gait Details: deferred due to [facility] and desaturation pt has experienced today. in standing spO2 ~low 80s   Stairs              Wheelchair Mobility    Modified Rankin (Stroke Patients Only)       Balance Overall balance assessment: Needs assistance Sitting-balance support: Single extremity supported;No upper extremity supported;Feet supported Sitting balance-Leahy Scale: Normal     Standing balance support: During functional activity Standing balance-Leahy Scale: Fair Standing balance comment: able to side step and stand without UE support                            Cognition Arousal/Alertness: Awake/alert Behavior During Therapy: WFL for tasks assessed/performed;Agitated Overall Cognitive Status: Within Functional Limits for tasks assessed                                 General Comments: resistant to any education regarding affects of Covid on breathing and O2 needs      Exercises Other Exercises Other Exercises: stood and marching in place x 5 minutes at bedside    General Comments        Pertinent Vitals/Pain Pain Assessment: No/denies pain    Home Living  Prior Function            PT Goals (current goals can now be found in the care plan section) Progress towards PT goals: Progressing toward goals    Frequency    Min 2X/week      PT Plan Current plan remains appropriate    Co-evaluation              AM-PAC PT "6 Clicks" Mobility   Outcome Measure  Help needed turning from your back to your side while in a flat bed without using bedrails?: None Help needed moving from lying on your back to sitting on the side of a flat bed without using bedrails?: None Help needed moving to and from a bed to a chair (including a wheelchair)?: None Help needed standing up from a chair using your arms (e.g., wheelchair or bedside chair)?: None Help needed to walk in hospital room?: A Little Help needed climbing 3-5 steps with a railing? : A Little 6 Click Score: 22    End of Session Equipment Utilized During  Treatment: Oxygen Activity Tolerance: Patient tolerated treatment well Patient left: in bed;with call bell/phone within reach Nurse Communication: Mobility status;Other (comment)       Time: 5732-2567 PT Time Calculation (min) (ACUTE ONLY): 25 min  Charges:  $Therapeutic Exercise: 8-22 mins $Therapeutic Activity: 8-22 mins                    Chesley Noon, PTA 02/18/20, 2:20 PM

## 2020-02-18 NOTE — Progress Notes (Signed)
Cross Cover Changed insulin coverage to every four hours secondary to continuedd severe hyperglycemia

## 2020-02-18 NOTE — Progress Notes (Signed)
[name] at [place] NAME: Raymond [name]    MR#:  [id number]  DATE OF BIRTH:  [birth year]  SUBJECTIVE:   switched to 15 L HFNC oxygen sugars fluctuating despite high doses of inuslin Eats all his meal 100%  Wants to go home-  REVIEW OF SYSTEMS:   Review of Systems  Constitutional: Negative for chills, fever and weight loss.  HENT: Negative for ear discharge, ear pain and nosebleeds.   Eyes: Negative for blurred vision, pain and discharge.  Respiratory: Positive for shortness of breath. Negative for sputum production, wheezing and stridor.   Cardiovascular: Negative for chest pain, palpitations, orthopnea and PND.  Gastrointestinal: Negative for abdominal pain, diarrhea, nausea and vomiting.  Genitourinary: Negative for frequency and urgency.  Musculoskeletal: Negative for back pain and joint pain.  Neurological: Positive for weakness. Negative for sensory change, speech change and focal weakness.  Psychiatric/Behavioral: Negative for depression and hallucinations. The patient is not nervous/anxious.    Tolerating Diet:yes Tolerating PT: No PT f/u after evaluation  DRUG ALLERGIES:  No Known Allergies  VITALS:  Blood pressure 122/61, pulse 76, temperature 98.5 F (36.9 C), temperature source Oral, resp. rate 19, height 6' (1.829 m), weight (!) 164.2 kg, SpO2 93 %.  PHYSICAL EXAMINATION:   Physical Exam  GENERAL:  55 y.o.-year-old patient lying in the bed with no acute distress. Morbidly obese HEENT: Head atraumatic, normocephalic. Oropharynx and nasopharynx clear. HFNC+ NECK:  Supple, no jugular venous distention. No thyroid enlargement, no tenderness.  LUNGS:shallow breath sounds bilaterally, no wheezing, rales, rhonchi. No use of accessory muscles of respiration.  CARDIOVASCULAR: S1, S2 normal. No murmurs, rubs, or gallops.  ABDOMEN: Soft, nontender, nondistended. Bowel sounds present. No organomegaly or mass. Abdominal  obesity EXTREMITIES: No cyanosis, clubbing  chronic + edema b/l.    NEUROLOGIC: Cranial nerves II through XII are intact. No focal Motor or sensory deficits b/l.   PSYCHIATRIC:  patient is alert and oriented x 3.  SKIN: No obvious rash, lesion, or ulcer.   LABORATORY PANEL:  CBC Recent Labs  Lab 02/12/20 0126  WBC 11.5*  HGB 11.3*  HCT 35.9*  PLT 364    Chemistries  Recent Labs  Lab 02/16/20 0249  NA 136  K 4.5  CL 91*  CO2 32  GLUCOSE 357*  BUN 45*  CREATININE 1.33*  CALCIUM 9.4  MG 2.4  AST 18  ALT 28  ALKPHOS 57  BILITOT 0.7   Cardiac Enzymes No results for input(s): TROPONINI in the last 168 hours. RADIOLOGY:  No results found. 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 Pt developed cough and shortness of breath. Also subjective fever. No chest pain, no pleuritic pain. No n/v/d. No abdominal pain. No dysuria. Has had 2 vaccines but no booster. Required 3 L at home, up from baseline of 2 L  Acute hypoxic respiratory failure due to SARS COVID-19 viral pneumonia. 02/05/20 POC SARS coronavirus positive - completed Remdesivir per pharmacy -Solu-Medrol 40 mg BID---now po prednisone taper - Combivent QID - Flutter valve and Incentive spirometry - Titrate O2 to maintain SPO2> 92% --1/10 Baricitinib per pharmacy protocol.  Patient currently meeting criteria for use -CRP--16.9-->9.7 -- continues to remain on heated high flow nasal, oxygen. D/w RT and RN to cont weaning keep sats >85% --pt has long road to  recovery! --OOB to chair --PT recommends out pt PT --1/18--now weaned down to 15 L HFNC  Chronic Diastolic CHF - Amlodipine,Losartan  -Torsemide 40 mg daily (home) -received lasix IV for several days now on torsemide  Essential HTN -cont home meds as above  DM type II uncontrolled  with Hyperglycemia - 1/5 hemoglobin A1c= 7.8 - 1/11--transferred to ICU for IV insulin gtt --1/12--sugars still I the upper 200's --cont tradjenta --1/13-- now on Humulin U500 35 units bid, Novolog 50 units tid and ssi --1/14--Increased to 40 units bid --1/15--increased U 500 to 45 units bid per Diab RN rec --1/16--U 500 50 units bid --1/17-- U 500 55 units bid  Acute on CKD stage II (baseline Cr 1.36) --creat stable  Obesity class III - See diabetes  OSA - On trilogy at home, will use CPAP while in hospital per respiratory protocol  Patient continue to be at high risk forworsening respiratory failure. He has long ways to go!! Very slow wean  Status is: Inpatient  Remains inpatient appropriate because of slow wean of oxygen due to multiple co-morbidities  DVT prophylaxis: Lovenox Code Status: Full Family Communication:  status is: Inpatient    Dispo: The patient is from: Home  Anticipated d/c is to: Home  Anticipated d/c date is: TBD  Patient currently unstable -- requiring significant amount of oxygen with high flow--weaning down slowly   TOTAL TIME TAKING CARE OF THIS PATIENT: *25* minutes.  >50% time spent on counselling and coordination of care  Note: This dictation was prepared with Dragon dictation along with smaller phrase technology. Any transcriptional errors that result from this process are unintentional.  Raymond [name] M.D    [facility]   CC: Primary care physician; Raymond [name], NPPatient ID: Raymond [name], male   DOB: 06-20-1965, 55 y.o.   MRN: [id number]

## 2020-02-18 NOTE — Progress Notes (Signed)
Occupational Therapy Treatment Patient Details Name: Raymond [name] MRN: [id number] DOB: 1965/06/28 Today's Date: 02/18/2020    History of present illness Raymond [name] is a 22yoM who comes to [facility] 02/05/20 BIBA c SOB, COVID (+) contact. Pt febrile (mildly), COVIDtest (+). PMH: CHF on 2L baseline, OSA on triology, DM2, bilat PND c orthopedic shoes, bilat toe amputations.   OT comments  Upon entering the room, pt seated on edge of bed and agreeable to OT intervention. Pt reports feeling better but also that he "knows his body". OT checking O2 while pt is on 15 L HFNC and at rest with O2 saturation of 91%. Pt performed bed mobility with supervision and standing at sink to brush teeth and wash face and pt removes O2 to do so. After 4 minutes, OT checking O2 with pt observing desaturation of 73%. Pt did verbalize, " Wow, I did not know it was that low." Pt recovered quickly to the 90s once O2 reapplied. OT able to provide education regarding pt's body not providing him with cues that his O2 is low and he can't always go by how he feels. Pt verbalized understand. O2 starting energy conservation education for self care tasks and functional mobility. Pt continues to benefit from OT intervention.    Follow Up Recommendations  Supervision - Intermittent;No OT follow up    Equipment Recommendations  None recommended by OT       Precautions / Restrictions Precautions Precautions: Fall Precaution Comments: orthopedic shoes in room Restrictions Weight Bearing Restrictions: No       Mobility Bed Mobility Overal bed mobility: Modified Independent Bed Mobility: Supine to Sit;Sit to Supine     Supine to sit: Supervision;HOB elevated Sit to supine: HOB elevated      Transfers Overall transfer level: Needs assistance Equipment used: None Transfers: Sit to/from Stand Sit to Stand: Supervision Stand pivot transfers: Supervision            Balance Overall balance  assessment: Needs assistance Sitting-balance support: Single extremity supported;No upper extremity supported;Feet supported Sitting balance-Leahy Scale: Normal     Standing balance support: During functional activity Standing balance-Leahy Scale: Fair Standing balance comment: able to side step and stand without UE support                           ADL either performed or assessed with clinical judgement   ADL Overall ADL's : Needs assistance/impaired     Grooming: Wash/dry hands;Wash/dry face;Standing;Supervision/safety                                       Vision Baseline Vision/History: No visual deficits Patient Visual Report: No change from baseline Vision Assessment?: No apparent visual deficits          Cognition Arousal/Alertness: Awake/alert Behavior During Therapy: WFL for tasks assessed/performed;Agitated Overall Cognitive Status: Within Functional Limits for tasks assessed                                 General Comments: resistant to any education regarding affects of Covid on breathing and O2 needs        Exercises Other Exercises Other Exercises: stood and marching in place x 5 minutes at bedside           Pertinent Vitals/ Pain  Pain Assessment: No/denies pain         Frequency  Min 2X/week        Progress Toward Goals  OT Goals(current goals can now be found in the care plan section)  Progress towards OT goals: Progressing toward goals  Acute Rehab OT Goals Patient Stated Goal: to go home OT Goal Formulation: With patient Time For Goal Achievement: 02/28/20 Potential to Achieve Goals: Good  Plan Discharge plan remains appropriate       AM-PAC OT "6 Clicks" Daily Activity     Outcome Measure   Help from another person eating meals?: None Help from another person taking care of personal grooming?: A Little Help from another person toileting, which includes using toliet, bedpan, or  urinal?: A Little Help from another person bathing (including washing, rinsing, drying)?: A Little Help from another person to put on and taking off regular upper body clothing?: None Help from another person to put on and taking off regular lower body clothing?: A Little 6 Click Score: 20    End of Session Equipment Utilized During Treatment: Oxygen  OT Visit Diagnosis: Muscle weakness (generalized) (M62.81);Unsteadiness on feet (R26.81)   Activity Tolerance Patient tolerated treatment well   Patient Left in chair;with call bell/phone within reach   Nurse Communication Mobility status        Time: 4536-4680 OT Time Calculation (min): 34 min  Charges: OT General Charges $OT Visit: 1 Visit OT Treatments $Self Care/Home Management : 8-22 mins $Therapeutic Activity: 8-22 mins  Darleen Crocker, MS, OTR/L , CBIS ascom [phone]  02/18/20, 4:35 PM

## 2020-02-18 NOTE — Progress Notes (Signed)
CBG 561. Contacted MD Patel via secure chat. MD notified me to administer one time dose os 30 units of Novolog. I administered Novolog, Humulin and Combivent.

## 2020-02-18 NOTE — Progress Notes (Addendum)
Inpatient Diabetes Program Recommendations  AACE/ADA: New Consensus Statement on Inpatient Glycemic Control (2015)  Target Ranges:  Prepandial:   less than 140 mg/dL      Peak postprandial:   less than 180 mg/dL (1-2 hours)      Critically ill patients:  140 - 180 mg/dL   Lab Results  Component Value Date   GLUCAP 336 (H) 02/18/2020   HGBA1C 7.8 (H) 02/05/2020    Review of Glycemic Control Results for Raymond [name], Raymond [name] (MRN [id number]) as of 02/18/2020 08:38  Ref. Range 02/17/2020 15:44 02/17/2020 22:25 02/18/2020 01:24 02/18/2020 05:19  Glucose-Capillary Latest Ref Range: 70 - 99 mg/dL 407 (H) 450 (H) 399 (H) 336 (H)  Outpatient Diabetes meds:Levemir 89 units BID                                              Humalog 60 units TID with meals                                              Metformin 1000 mg BID  Current orders:Humulin R U500 Insulin 55 units BID                            Novolog 0-20 units q 4 hours                            Tradjenta 5 mg daily  Inpatient Diabetes Program Recommendations:    If blood sugars continue to be>200 mg/dL, consider changing Humulin R U500 to 55 units tid with meals (instead of bid)?  Thanks,  Adah Perl, RN, BC-ADM Inpatient Diabetes Coordinator Pager [phone] (8a-5p)

## 2020-02-18 NOTE — Progress Notes (Signed)
This RN assessed patient when doing morning medication pass. Patient reports they are ready to go home. A discussion was had about the patient's current oxygenation requirements (45L at 60% HHFNC). Patient reports "I do not stop breathing when I take oxygen off so I do not need it."  Education was provided to patient about the bodies oxygen requirements. I have notified respiratory to assess the possibility of reducing patient's needs for oxygen. Patient is aware that RT is coming to assess. Patient had no further questions at this time.

## 2020-02-19 LAB — GLUCOSE, CAPILLARY
Glucose-Capillary: 272 mg/dL — ABNORMAL HIGH (ref 70–99)
Glucose-Capillary: 366 mg/dL — ABNORMAL HIGH (ref 70–99)
Glucose-Capillary: 423 mg/dL — ABNORMAL HIGH (ref 70–99)
Glucose-Capillary: 326 mg/dL — ABNORMAL HIGH (ref 70–99)

## 2020-02-19 LAB — BASIC METABOLIC PANEL
Anion gap: 12 (ref 5–15)
BUN: 59 mg/dL — ABNORMAL HIGH (ref 6–20)
CO2: 27 mmol/L (ref 22–32)
Calcium: 9.1 mg/dL (ref 8.9–10.3)
Chloride: 90 mmol/L — ABNORMAL LOW (ref 98–111)
Creatinine, Ser: 1.36 mg/dL — ABNORMAL HIGH (ref 0.61–1.24)
GFR, Estimated: >60 mL/min (ref >60)
Glucose, Bld: 388 mg/dL — ABNORMAL HIGH (ref 70–99)
Potassium: 4.3 mmol/L (ref 3.5–5.1)
Sodium: 129 mmol/L — ABNORMAL LOW (ref 135–145)

## 2020-02-19 LAB — GLUCOSE, RANDOM: Glucose, Bld: 364 mg/dL — ABNORMAL HIGH (ref 70–99)

## 2020-02-19 MED ORDER — INSULIN REGULAR HUMAN (CONC) 500 UNIT/ML ~~LOC~~ SOPN
70.0000 [IU] | PEN_INJECTOR | Freq: Three times a day (TID) | SUBCUTANEOUS | Status: DC
  Administered 2020-02-19 – 2020-02-22 (×10): 70 [IU] via SUBCUTANEOUS

## 2020-02-19 NOTE — Progress Notes (Signed)
Progress Note    Raymond [name]  [id number] DOB: 08/22/1965  DOA: 02/05/2020 PCP: [name], NP      Brief Narrative:    Medical records reviewed and are as summarized below:  Fayrene Helper [place] is a 55 y.o. male       Assessment/Plan:   Principal Problem:   Acute hypoxemic respiratory failure due to COVID-19 [facility]) Active Problems:   OSA treated with Trilogy machine   Hypoxemia requiring supplemental oxygen   Morbid obesity ([place])   Chronic diastolic heart failure (HCC)   HTN (hypertension)   Diabetes ([place])   Chronic diastolic heart failure, NYHA class 1 ([place])   Uncontrolled type 2 diabetes mellitus with hyperglycemia ([place])   Morbid obesity with BMI of 50.0-59.9, adult ([place])   Nutrition Problem: Increased nutrient needs Etiology: catabolic illness (pneumonia secondary to COVID-19 virus infection)  Signs/Symptoms: estimated needs   Body mass index is 49.08 kg/m.  (Morbid obesity)   PLAN  He completed IV remdesivir for COVID-pneumonia.  He will finish prednisone tomorrow. Continue baricitinib for COVID-pneumonia.  Continue bronchodilators and incentive spirometry as needed.  He is on 15 L/min oxygen via high flow nasal cannula.  Wean down oxygen as able.  Continue torsemide for chronic diastolic CHF Continue antihypertensives  Increase insulin U500 from 55 to 70 units 3 times daily per diabetic nurse's recommendation.  Continue NovoLog with meals and Tradjenta.  Follow-up glucose levels closely.   Diet Order            Diet Carb Modified Fluid consistency: Thin; Room service appropriate? Yes  Diet effective now                    Consultants:  None  Procedures:  None    Medications:   . amLODipine  10 mg Oral Daily  . aspirin EC  81 mg Oral Daily  . baricitinib  4 mg Oral Daily  . Chlorhexidine Gluconate Cloth  6 each Topical Daily  . enoxaparin (LOVENOX) injection  85 mg Subcutaneous Q24H  .  insulin aspart  0-20 Units Subcutaneous TID AC & HS  . insulin regular human CONCENTRATED  70 Units Subcutaneous TID WC  . Ipratropium-Albuterol  1 puff Inhalation QID  . linagliptin  5 mg Oral Daily  . losartan  50 mg Oral Daily  . metoprolol tartrate  12.5 mg Oral BID  . multivitamin with minerals  1 tablet Oral Daily  . pantoprazole  40 mg Oral Daily  . [START ON 02/20/2020] predniSONE  10 mg Oral Q breakfast  . Ensure Max Protein  11 oz Oral BID BM  . sodium chloride flush  3 mL Intravenous Q12H  . torsemide  40 mg Oral Daily   Continuous Infusions: . sodium chloride Stopped (02/11/20 1927)     Anti-infectives (From admission, onward)   Start     Dose/Rate Route Frequency Ordered Stop   02/06/20 1000  remdesivir 100 mg in sodium chloride 0.9 % 100 mL IVPB       "Followed by" Linked Group Details   100 mg 200 mL/hr over 30 Minutes Intravenous Daily 02/05/20 1141 02/09/20 1344   02/05/20 1245  remdesivir 200 mg in sodium chloride 0.9% 250 mL IVPB       "Followed by" Linked Group Details   200 mg 580 mL/hr over 30 Minutes Intravenous Once 02/05/20 1141 02/05/20 1248             Family Communication/Anticipated D/C date  and plan/Code Status   DVT prophylaxis:      Code Status: Full Code  Family Communication: None Disposition Plan:    Status is: Inpatient  Remains inpatient appropriate because:Unsafe d/c plan and Severe hypoxia   Dispo: The patient is from: Home              Anticipated d/c is to: Home              Anticipated d/c date is: > 3 days              Patient currently is not medically stable to d/c.           Subjective:   Interval events noted.  He complains of shortness of breath and generalized weakness.  Objective:    Vitals:   02/19/20 0435 02/19/20 0836 02/19/20 1214 02/19/20 1532  BP: 122/67 127/62 101/63 128/65  Pulse: 77 72 68 77  Resp: 17 20 20 18   Temp: 98.4 F (36.9 C) 98.7 F (37.1 C) 98.1 F (36.7 C) 98.6 F  (37 C)  TempSrc: Axillary Oral Oral Oral  SpO2: 96% 100% 94% 95%  Weight:      Height:       No data found.   Intake/Output Summary (Last 24 hours) at 02/19/2020 1706 Last data filed at 02/19/2020 1540 Gross per 24 hour  Intake 240 ml  Output 2580 ml  Net -2340 ml   Filed Weights   02/05/20 1031 02/06/20 2238 02/18/20 0543  Weight: (!) 170 kg (!) 178.8 kg (!) 164.2 kg    Exam:  GEN: NAD SKIN: No rash EYES: EOM ENT: MMM CV: RRR PULM: CTA B ABD: soft, obese, NT, +BS CNS: AAO x 3, non focal EXT: No edema or tenderness   Data Reviewed:   I have personally reviewed following labs and imaging studies:  Labs: Labs show the following:   Basic Metabolic Panel: Recent Labs  Lab 02/13/20 0200 02/14/20 2139 02/15/20 1117 02/15/20 2010 02/16/20 0249 02/18/20 2149 02/19/20 1359  NA 136 135  --   --  136 128* 129*  K 4.5 5.3*   < >  --  4.5 5.5* 4.3  CL 94* 90*  --   --  91* 88* 90*  CO2 30 30  --   --  32 27 27  GLUCOSE 206* 452*  --  535* 357* 521* 388*  BUN 45* 43*  --   --  45* 59* 59*  CREATININE 1.37* 1.49*  --   --  1.33* 1.62* 1.36*  CALCIUM 9.3 9.2  --   --  9.4 9.2 9.1  MG 2.1 2.4  --   --  2.4  --   --   PHOS 4.8*  --   --   --   --   --   --    < > = values in this interval not displayed.   GFR Estimated Creatinine Clearance: 96.3 mL/min (A) (by C-G formula based on SCr of 1.36 mg/dL (H)). Liver Function Tests: Recent Labs  Lab 02/13/20 0200 02/16/20 0249  AST 22 18  ALT 26 28  ALKPHOS 50 57  BILITOT 0.7 0.7  PROT 7.6 7.9  ALBUMIN 3.2* 3.2*   No results for input(s): LIPASE, AMYLASE in the last 168 hours. No results for input(s): AMMONIA in the last 168 hours. Coagulation profile No results for input(s): INR, PROTIME in the last 168 hours.  CBC: No results for input(s): WBC, NEUTROABS, HGB, HCT, MCV,  PLT in the last 168 hours. Cardiac Enzymes: No results for input(s): CKTOTAL, CKMB, CKMBINDEX, TROPONINI in the last 168 hours. BNP (last  3 results) No results for input(s): PROBNP in the last 8760 hours. CBG: Recent Labs  Lab 02/18/20 1817 02/18/20 2123 02/19/20 0835 02/19/20 1250 02/19/20 1543  GLUCAP 561* 535* 272* 366* 423*   D-Dimer: No results for input(s): DDIMER in the last 72 hours. Hgb A1c: No results for input(s): HGBA1C in the last 72 hours. Lipid Profile: No results for input(s): CHOL, HDL, LDLCALC, TRIG, CHOLHDL, LDLDIRECT in the last 72 hours. Thyroid function studies: No results for input(s): TSH, T4TOTAL, T3FREE, THYROIDAB in the last 72 hours.  Invalid input(s): FREET3 Anemia work up: No results for input(s): VITAMINB12, FOLATE, FERRITIN, TIBC, IRON, RETICCTPCT in the last 72 hours. Sepsis Labs: No results for input(s): PROCALCITON, WBC, LATICACIDVEN in the last 168 hours.  Microbiology No results found for this or any previous visit (from the past 240 hour(s)).  Procedures and diagnostic studies:  No results found.             LOS: 14 days   [place] Copywriter, advertising on [email or web address]. If 7PM-7AM, please contact night-coverage at [email or web address]     02/19/2020, 5:06 PM

## 2020-02-19 NOTE — Progress Notes (Signed)
Inpatient Diabetes Program Recommendations  AACE/ADA: New Consensus Statement on Inpatient Glycemic Control (2015)  Target Ranges:  Prepandial:   less than 140 mg/dL      Peak postprandial:   less than 180 mg/dL (1-2 hours)      Critically ill patients:  140 - 180 mg/dL   Lab Results  Component Value Date   CHYIFO 277 (HH) 02/18/2020   HGBA1C 7.8 (H) 02/05/2020    Review of Glycemic Control Results for KELLAN, Raymond [name] (MRN [id number]) as of 02/19/2020 08:24  Ref. Range 02/18/2020 10:18 02/18/2020 13:51 02/18/2020 18:17 02/18/2020 21:23  Glucose-Capillary Latest Ref Range: 70 - 99 mg/dL 325 (H) 360 (H) 561 (HH) 535 (HH)  Outpatient Diabetes meds:Levemir 89 units BID Humalog 60 units TID with meals Metformin 1000 mg BID  Current orders:Humulin R U500Insulin 55units tid with meals Novolog 0-20 units tid with meals Tradjenta 5 mg daily  Inpatient Diabetes Program Recommendations:    Please consider increasing Humulin R U500 70 units tid with meals.   Thanks,  Adah Perl, RN, BC-ADM Inpatient Diabetes Coordinator Pager [phone] (8a-5p)

## 2020-02-20 DIAGNOSIS — R0902 Hypoxemia: Secondary | ICD-10-CM

## 2020-02-20 DIAGNOSIS — Z9981 Dependence on supplemental oxygen: Secondary | ICD-10-CM

## 2020-02-20 LAB — GLUCOSE, CAPILLARY
Glucose-Capillary: 210 mg/dL — ABNORMAL HIGH (ref 70–99)
Glucose-Capillary: 262 mg/dL — ABNORMAL HIGH (ref 70–99)
Glucose-Capillary: 347 mg/dL — ABNORMAL HIGH (ref 70–99)
Glucose-Capillary: 393 mg/dL — ABNORMAL HIGH (ref 70–99)

## 2020-02-20 LAB — BASIC METABOLIC PANEL
Anion gap: 11 (ref 5–15)
BUN: 48 mg/dL — ABNORMAL HIGH (ref 6–20)
CO2: 29 mmol/L (ref 22–32)
Calcium: 9.2 mg/dL (ref 8.9–10.3)
Chloride: 95 mmol/L — ABNORMAL LOW (ref 98–111)
Creatinine, Ser: 1.11 mg/dL (ref 0.61–1.24)
GFR, Estimated: >60 mL/min (ref >60)
Glucose, Bld: 167 mg/dL — ABNORMAL HIGH (ref 70–99)
Potassium: 4.2 mmol/L (ref 3.5–5.1)
Sodium: 135 mmol/L (ref 135–145)

## 2020-02-20 NOTE — Progress Notes (Signed)
Progress Note    Raymond [name]  [id number] DOB: October 15, 1965  DOA: 02/05/2020 PCP: [name], NP      Brief Narrative:    Medical records reviewed and are as summarized below:  Raymond [name] is a 55 y.o. male       Assessment/Plan:   Principal Problem:   Acute hypoxemic respiratory failure due to COVID-19 [facility]) Active Problems:   OSA treated with Trilogy machine   Hypoxemia requiring supplemental oxygen   Morbid obesity ([place])   Chronic diastolic heart failure (HCC)   HTN (hypertension)   Diabetes ([place])   Chronic diastolic heart failure, NYHA class 1 ([place])   Uncontrolled type 2 diabetes mellitus with hyperglycemia ([place])   Morbid obesity with BMI of 50.0-59.9, adult ([place])   Nutrition Problem: Increased nutrient needs Etiology: catabolic illness (pneumonia secondary to COVID-19 virus infection)  Signs/Symptoms: estimated needs   Body mass index is 49.16 kg/m.  (Morbid obesity)   PLAN  He completed IV remdesivir for COVID-pneumonia. He completed prednisone on 02/20/20. Continue baricitinib for COVID-19 pneumonia.Continue bronchodilators and incentive spirometry as needed.  Oxygen requirement is down to 9 L/min via HFNC. Taper down oxygen as able.  Continue torsemide for chronic diastolic CHF Continue antihypertensives  Continue insulin U 500, NovoLog with meals and Tradjenta. Insulin requirement may decrease with discontinuation of prednisone.   Diet Order            Diet Carb Modified Fluid consistency: Thin; Room service appropriate? Yes  Diet effective now                    Consultants:  None  Procedures:  None    Medications:   . amLODipine  10 mg Oral Daily  . aspirin EC  81 mg Oral Daily  . baricitinib  4 mg Oral Daily  . Chlorhexidine Gluconate Cloth  6 each Topical Daily  . enoxaparin (LOVENOX) injection  85 mg Subcutaneous Q24H  . insulin aspart  0-20 Units Subcutaneous TID AC & HS  .  insulin regular human CONCENTRATED  70 Units Subcutaneous TID WC  . Ipratropium-Albuterol  1 puff Inhalation QID  . linagliptin  5 mg Oral Daily  . losartan  50 mg Oral Daily  . metoprolol tartrate  12.5 mg Oral BID  . multivitamin with minerals  1 tablet Oral Daily  . pantoprazole  40 mg Oral Daily  . Ensure Max Protein  11 oz Oral BID BM  . sodium chloride flush  3 mL Intravenous Q12H  . torsemide  40 mg Oral Daily   Continuous Infusions: . sodium chloride Stopped (02/11/20 1927)     Anti-infectives (From admission, onward)   Start     Dose/Rate Route Frequency Ordered Stop   02/06/20 1000  remdesivir 100 mg in sodium chloride 0.9 % 100 mL IVPB       "Followed by" Linked Group Details   100 mg 200 mL/hr over 30 Minutes Intravenous Daily 02/05/20 1141 02/09/20 1344   02/05/20 1245  remdesivir 200 mg in sodium chloride 0.9% 250 mL IVPB       "Followed by" Linked Group Details   200 mg 580 mL/hr over 30 Minutes Intravenous Once 02/05/20 1141 02/05/20 1248             Family Communication/Anticipated D/C date and plan/Code Status   DVT prophylaxis:      Code Status: Full Code  Family Communication: None Disposition Plan:    Status is:  Inpatient  Remains inpatient appropriate because:Unsafe d/c plan and Severe hypoxia   Dispo: The patient is from: Home              Anticipated d/c is to: Home              Anticipated d/c date is: > 3 days              Patient currently is not medically stable to d/c.           Subjective:   Interval events noted. He feels a little better today  Objective:    Vitals:   02/20/20 0452 02/20/20 0846 02/20/20 0946 02/20/20 1221  BP: 125/64  128/72 131/64  Pulse: 72  86 85  Resp: 16  17 18   Temp: 98.2 F (36.8 C)  98 F (36.7 C) 98.6 F (37 C)  TempSrc:   Oral Oral  SpO2: 100% 97% 91% (!) 88%  Weight:      Height:       No data found.   Intake/Output Summary (Last 24 hours) at 02/20/2020 1525 Last data  filed at 02/20/2020 1610 Gross per 24 hour  Intake 0 ml  Output 3160 ml  Net -3160 ml   Filed Weights   02/06/20 2238 02/18/20 0543 02/20/20 0202  Weight: (!) 178.8 kg (!) 164.2 kg (!) 164.4 kg    Exam:  GEN: NAD SKIN: No rash EYES: EOM ENT: MMM CV: RRR PULM: CTA B ABD: soft, obese, NT, +BS CNS: AAO x 3, non focal EXT: No edema or tenderness   Data Reviewed:   I have personally reviewed following labs and imaging studies:  Labs: Labs show the following:   Basic Metabolic Panel: Recent Labs  Lab 02/14/20 2139 02/15/20 1117 02/16/20 0249 02/18/20 2149 02/19/20 1359 02/19/20 1733 02/20/20 0626  NA 135  --  136 128* 129*  --  135  K 5.3*   < > 4.5 5.5* 4.3  --  4.2  CL 90*  --  91* 88* 90*  --  95*  CO2 30  --  32 27 27  --  29  GLUCOSE 452*   < > 357* 521* 388* 364* 167*  BUN 43*  --  45* 59* 59*  --  48*  CREATININE 1.49*  --  1.33* 1.62* 1.36*  --  1.11  CALCIUM 9.2  --  9.4 9.2 9.1  --  9.2  MG 2.4  --  2.4  --   --   --   --    < > = values in this interval not displayed.   GFR Estimated Creatinine Clearance: 118 mL/min (by C-G formula based on SCr of 1.11 mg/dL). Liver Function Tests: Recent Labs  Lab 02/16/20 0249  AST 18  ALT 28  ALKPHOS 57  BILITOT 0.7  PROT 7.9  ALBUMIN 3.2*   No results for input(s): LIPASE, AMYLASE in the last 168 hours. No results for input(s): AMMONIA in the last 168 hours. Coagulation profile No results for input(s): INR, PROTIME in the last 168 hours.  CBC: No results for input(s): WBC, NEUTROABS, HGB, HCT, MCV, PLT in the last 168 hours. Cardiac Enzymes: No results for input(s): CKTOTAL, CKMB, CKMBINDEX, TROPONINI in the last 168 hours. BNP (last 3 results) No results for input(s): PROBNP in the last 8760 hours. CBG: Recent Labs  Lab 02/19/20 1250 02/19/20 1543 02/19/20 2110 02/20/20 0942 02/20/20 1216  GLUCAP 366* 423* 326* 210* 262*   D-Dimer: No  results for input(s): DDIMER in the last 72  hours. Hgb A1c: No results for input(s): HGBA1C in the last 72 hours. Lipid Profile: No results for input(s): CHOL, HDL, LDLCALC, TRIG, CHOLHDL, LDLDIRECT in the last 72 hours. Thyroid function studies: No results for input(s): TSH, T4TOTAL, T3FREE, THYROIDAB in the last 72 hours.  Invalid input(s): FREET3 Anemia work up: No results for input(s): VITAMINB12, FOLATE, FERRITIN, TIBC, IRON, RETICCTPCT in the last 72 hours. Sepsis Labs: No results for input(s): PROCALCITON, WBC, LATICACIDVEN in the last 168 hours.  Microbiology No results found for this or any previous visit (from the past 240 hour(s)).  Procedures and diagnostic studies:  No results found.             LOS: 15 days   [place] [address] Copywriter, advertising on [email or web address]. If 7PM-7AM, please contact night-coverage at [email or web address]     02/20/2020, 3:25 PM

## 2020-02-21 LAB — GLUCOSE, CAPILLARY
Glucose-Capillary: 201 mg/dL — ABNORMAL HIGH (ref 70–99)
Glucose-Capillary: 297 mg/dL — ABNORMAL HIGH (ref 70–99)
Glucose-Capillary: 254 mg/dL — ABNORMAL HIGH (ref 70–99)
Glucose-Capillary: 284 mg/dL — ABNORMAL HIGH (ref 70–99)

## 2020-02-21 NOTE — Progress Notes (Signed)
Nutrition Follow Up Note   DOCUMENTATION CODES:   Morbid obesity  INTERVENTION:   Ensure Max po BID, each supplement provides 150 kcal and 30 grams of protein  MVI with minerals daily  NUTRITION DIAGNOSIS:   Increased nutrient needs related to catabolic illness (pneumonia secondary to COVID-19 virus infection) as evidenced by estimated needs.  GOAL:   Patient will meet greater than or equal to 90% of their needs -progressing   MONITOR:   Labs,I & O's,Supplement acceptance,PO intake,Weight trends  ASSESSMENT:   55 year old male admitted for pneumonia due to COVID-19 virus presented with cough and shortness of breath after being exposed to family member with Covid 2 days ago. Past medical history of morbid obesity, DM2, OSA on trilogy, chronic respiratory failure on 2 L home O2, and dCHF.   Pt with good appetite and oral intake; pt eating 100% of meals and is drinking Ensure supplements. Per chart, pt is down ~10lbs since admit; RD will continue to monitor.   Medications reviewed and include: aspirin, lovenox, insulin, MVI, protonix, torsemide  Labs reviewed: BUN 48(H) Wbc- 11.5(H) cbgs- 201, 297 x 24 hrs  Diet Order:   Diet Order            Diet Carb Modified Fluid consistency: Thin; Room service appropriate? Yes  Diet effective now                EDUCATION NEEDS:   Education needs have been addressed  Skin:  Skin Assessment: Reviewed RN Assessment  Last BM:  1/20  Height:   Ht Readings from Last 1 Encounters:  02/06/20 6' (1.829 m)    Weight:   Wt Readings from Last 1 Encounters:  02/21/20 (!) 165.2 kg    Ideal Body Weight:  80.9 kg  BMI:  Body mass index is 49.41 kg/m.  Estimated Nutritional Needs:   Kcal:  3000-3300kcal/day  Protein:  >130g/day  Fluid:  2.5-2.8L/day  Koleen Distance MS, RD, LDN Please refer to [facility] for RD and/or RD on-call/weekend/after hours pager

## 2020-02-21 NOTE — Progress Notes (Signed)
Progress Note    Raymond [name]  [id number] DOB: 1965-02-26  DOA: 02/05/2020 PCP: [name], NP      Brief Narrative:    Medical records reviewed and are as summarized below:  Raymond [name] is a 55 y.o. male       Assessment/Plan:   Principal Problem:   Acute hypoxemic respiratory failure due to COVID-19 [facility]) Active Problems:   OSA treated with Trilogy machine   Hypoxemia requiring supplemental oxygen   Morbid obesity ([place])   Chronic diastolic heart failure (HCC)   HTN (hypertension)   Diabetes ([place])   Chronic diastolic heart failure, NYHA class 1 ([place])   Uncontrolled type 2 diabetes mellitus with hyperglycemia ([place])   Morbid obesity with BMI of 50.0-59.9, adult ([place])   Nutrition Problem: Increased nutrient needs Etiology: catabolic illness (pneumonia secondary to COVID-19 virus infection)  Signs/Symptoms: estimated needs   Body mass index is 49.41 kg/m.  (Morbid obesity)   PLAN  He completed IV remdesivir for COVID-pneumonia. He completed prednisone on 02/20/20. Continue baricitinib for COVID-19 pneumonia.Continue bronchodilators and incentive spirometry as needed.  Oxygen requirement is down to 7 L/min via HFNC.  Continue to taper down oxygen as able.  Continue torsemide for chronic diastolic CHF Continue antihypertensives  Continue insulin U 500, NovoLog with meals and Tradjenta.  Glucose levels on adjust insulin therapy.   Diet Order            Diet Carb Modified Fluid consistency: Thin; Room service appropriate? Yes  Diet effective now                    Consultants:  None  Procedures:  None    Medications:   . amLODipine  10 mg Oral Daily  . aspirin EC  81 mg Oral Daily  . baricitinib  4 mg Oral Daily  . Chlorhexidine Gluconate Cloth  6 each Topical Daily  . enoxaparin (LOVENOX) injection  85 mg Subcutaneous Q24H  . insulin aspart  0-20 Units Subcutaneous TID AC & HS  . insulin  regular human CONCENTRATED  70 Units Subcutaneous TID WC  . Ipratropium-Albuterol  1 puff Inhalation QID  . linagliptin  5 mg Oral Daily  . losartan  50 mg Oral Daily  . metoprolol tartrate  12.5 mg Oral BID  . multivitamin with minerals  1 tablet Oral Daily  . pantoprazole  40 mg Oral Daily  . Ensure Max Protein  11 oz Oral BID BM  . sodium chloride flush  3 mL Intravenous Q12H  . torsemide  40 mg Oral Daily   Continuous Infusions: . sodium chloride Stopped (02/11/20 1927)     Anti-infectives (From admission, onward)   Start     Dose/Rate Route Frequency Ordered Stop   02/06/20 1000  remdesivir 100 mg in sodium chloride 0.9 % 100 mL IVPB       "Followed by" Linked Group Details   100 mg 200 mL/hr over 30 Minutes Intravenous Daily 02/05/20 1141 02/09/20 1344   02/05/20 1245  remdesivir 200 mg in sodium chloride 0.9% 250 mL IVPB       "Followed by" Linked Group Details   200 mg 580 mL/hr over 30 Minutes Intravenous Once 02/05/20 1141 02/05/20 1248             Family Communication/Anticipated D/C date and plan/Code Status   DVT prophylaxis:      Code Status: Full Code  Family Communication: None Disposition Plan:  Status is: Inpatient  Remains inpatient appropriate because:Unsafe d/c plan and Severe hypoxia   Dispo: The patient is from: Home              Anticipated d/c is to: Home              Anticipated d/c date is: > 3 days              Patient currently is not medically stable to d/c.           Subjective:   No acute issues overnight.  No shortness of breath or chest pain.  He is comfortable at rest.  Objective:    Vitals:   02/21/20 0559 02/21/20 0826 02/21/20 1228 02/21/20 1354  BP: (!) 111/59 126/70 108/62   Pulse: 66 68 74   Resp: 19 18 18    Temp: 98.7 F (37.1 C) 98.5 F (36.9 C) 98.1 F (36.7 C)   TempSrc: Oral Oral Oral   SpO2: 100% 100% 97% (P) 96%  Weight:      Height:       No data found.   Intake/Output Summary  (Last 24 hours) at 02/21/2020 1539 Last data filed at 02/21/2020 02/23/2020 Gross per 24 hour  Intake 240 ml  Output 1700 ml  Net -1460 ml   Filed Weights   02/18/20 0543 02/20/20 0202 02/21/20 0245  Weight: (!) 164.2 kg (!) 164.4 kg (!) 165.2 kg    Exam:  GEN: NAD SKIN: Warm and dry.  He has chronic hyperpigmented changes on the back EYES: No pallor or icterus ENT: MMM CV: RRR PULM: CTA B ABD: soft, obese, NT, +BS CNS: AAO x 3, non focal EXT: No edema or tenderness tenderness   Data Reviewed:   I have personally reviewed following labs and imaging studies:  Labs: Labs show the following:   Basic Metabolic Panel: Recent Labs  Lab 02/14/20 2139 02/15/20 1117 02/16/20 0249 02/18/20 2149 02/19/20 1359 02/19/20 1733 02/20/20 0626  NA 135  --  136 128* 129*  --  135  K 5.3*   < > 4.5 5.5* 4.3  --  4.2  CL 90*  --  91* 88* 90*  --  95*  CO2 30  --  32 27 27  --  29  GLUCOSE 452*   < > 357* 521* 388* 364* 167*  BUN 43*  --  45* 59* 59*  --  48*  CREATININE 1.49*  --  1.33* 1.62* 1.36*  --  1.11  CALCIUM 9.2  --  9.4 9.2 9.1  --  9.2  MG 2.4  --  2.4  --   --   --   --    < > = values in this interval not displayed.   GFR Estimated Creatinine Clearance: 118.3 mL/min (by C-G formula based on SCr of 1.11 mg/dL). Liver Function Tests: Recent Labs  Lab 02/16/20 0249  AST 18  ALT 28  ALKPHOS 57  BILITOT 0.7  PROT 7.9  ALBUMIN 3.2*   No results for input(s): LIPASE, AMYLASE in the last 168 hours. No results for input(s): AMMONIA in the last 168 hours. Coagulation profile No results for input(s): INR, PROTIME in the last 168 hours.  CBC: No results for input(s): WBC, NEUTROABS, HGB, HCT, MCV, PLT in the last 168 hours. Cardiac Enzymes: No results for input(s): CKTOTAL, CKMB, CKMBINDEX, TROPONINI in the last 168 hours. BNP (last 3 results) No results for input(s): PROBNP in the last 8760 hours.  CBG: Recent Labs  Lab 02/20/20 1216 02/20/20 1713 02/20/20 2144  02/21/20 0820 02/21/20 1223  GLUCAP 262* 347* 393* 201* 297*   D-Dimer: No results for input(s): DDIMER in the last 72 hours. Hgb A1c: No results for input(s): HGBA1C in the last 72 hours. Lipid Profile: No results for input(s): CHOL, HDL, LDLCALC, TRIG, CHOLHDL, LDLDIRECT in the last 72 hours. Thyroid function studies: No results for input(s): TSH, T4TOTAL, T3FREE, THYROIDAB in the last 72 hours.  Invalid input(s): FREET3 Anemia work up: No results for input(s): VITAMINB12, FOLATE, FERRITIN, TIBC, IRON, RETICCTPCT in the last 72 hours. Sepsis Labs: No results for input(s): PROCALCITON, WBC, LATICACIDVEN in the last 168 hours.  Microbiology No results found for this or any previous visit (from the past 240 hour(s)).  Procedures and diagnostic studies:  No results found.             LOS: 16 days   [place] Copywriter, advertising on [email or web address]. If 7PM-7AM, please contact night-coverage at [email or web address]     02/21/2020, 3:39 PM

## 2020-02-21 NOTE — Progress Notes (Signed)
Inpatient Diabetes Program Recommendations  AACE/ADA: New Consensus Statement on Inpatient Glycemic Control (2015)  Target Ranges:  Prepandial:   less than 140 mg/dL      Peak postprandial:   less than 180 mg/dL (1-2 hours)      Critically ill patients:  140 - 180 mg/dL   Lab Results  Component Value Date   GLUCAP 297 (H) 02/21/2020   HGBA1C 7.8 (H) 02/05/2020    Review of Glycemic Control Results for HAVARD, RADIGAN (MRN [id number]) as of 02/21/2020 14:38  Ref. Range 02/19/2020 08:35 02/19/2020 12:50 02/19/2020 15:43 02/19/2020 21:10 02/20/2020 09:42 02/20/2020 12:16 02/20/2020 17:13 02/20/2020 21:44 02/21/2020 08:20 02/21/2020 12:23  Glucose-Capillary Latest Ref Range: 70 - 99 mg/dL 272 (H) 366 (H) 423 (H) 326 (H) 210 (H) 262 (H) 347 (H) 393 (H) 201 (H) 297 (H)    Inpatient Diabetes Program Recommendations:    Please consider increasing U500 to 75 units TID.  Will continue to follow while inpatient.  Thank you, Reche Dixon, RN, BSN Diabetes Coordinator Inpatient Diabetes Program [phone] (team pager from 8a-5p)

## 2020-02-21 NOTE — Progress Notes (Signed)
Physical Therapy Treatment Patient Details Name: Raymond [name] MRN: [id number] DOB: Jun 12, 1965 Today's Date: 02/21/2020    History of Present Illness Raymond [name] is a 54yoM who comes to [facility] 02/05/20 BIBA c SOB, COVID (+) contact. Pt febrile (mildly), COVIDtest (+). PMH: CHF on 2L baseline, OSA on triology, DM2, bilat PND c orthopedic shoes, bilat toe amputations.    PT Comments    Pt easily woken at start of session, no complaints of pain. repiratory therapist entered room, switched pt from CPAP to HFNC on 9L. Pt agreeable to mobilize. Supine to sit modI, and sit <> stand modI twice during session as well. Pt able to complete with AD. He was able to stand, march, step forwards, backwards, laterally no LOB noted. SpO2 to mid 80s with exertion but recovered >90% quickly with rest. Pt seated on edge of bed with RN at bedside at end of session (declined recliner at this time). The patient would benefit from further skilled PT intervention to continue to progress towards goals. Recommendation remains appropriate. Pt would benefit from ambulation with oxygen next session as able.      Follow Up Recommendations  Outpatient PT     Equipment Recommendations  None recommended by PT    Recommendations for Other Services       Precautions / Restrictions Precautions Precautions: Fall Precaution Comments: orthopedic shoes in room Restrictions Weight Bearing Restrictions: No    Mobility  Bed Mobility Overal bed mobility: Modified Independent                Transfers Overall transfer level: Modified independent Equipment used: None Transfers: Sit to/from Stand Sit to Stand: Modified independent (Device/Increase time)            Ambulation/Gait Ambulation/Gait assistance: Modified independent (Device/Increase time)           General Gait Details: Pt able to step forwards/backwards/laterally at edge of bed with modI, spO2 to mid 80s after exertion but  recovered with seated rest break. on 9L HFNC   Stairs             Wheelchair Mobility    Modified Rankin (Stroke Patients Only)       Balance Overall balance assessment: Needs assistance Sitting-balance support: Feet supported Sitting balance-Leahy Scale: Normal       Standing balance-Leahy Scale: Good                              Cognition Arousal/Alertness: Awake/alert Behavior During Therapy: WFL for tasks assessed/performed;Agitated Overall Cognitive Status: Within Functional Limits for tasks assessed                                        Exercises Other Exercises Other Exercises: two bouts of standing, forwards/backwards/ lateral stepping    General Comments General comments (skin integrity, edema, etc.): on CPAP originally, transitioned to 9L by respiratory therapist prior to mobility      Pertinent Vitals/Pain Pain Assessment: No/denies pain    Home Living                      Prior Function            PT Goals (current goals can now be found in the care plan section) Progress towards PT goals: Progressing toward goals    Frequency  Min 2X/week      PT Plan Current plan remains appropriate    Co-evaluation              AM-PAC PT "6 Clicks" Mobility   Outcome Measure  Help needed turning from your back to your side while in a flat bed without using bedrails?: None Help needed moving from lying on your back to sitting on the side of a flat bed without using bedrails?: None Help needed moving to and from a bed to a chair (including a wheelchair)?: None Help needed standing up from a chair using your arms (e.g., wheelchair or bedside chair)?: None Help needed to walk in hospital room?: None Help needed climbing 3-5 steps with a railing? : A Little 6 Click Score: 23    End of Session Equipment Utilized During Treatment: Oxygen (9L) Activity Tolerance: Patient tolerated treatment  well Patient left: in bed;with call bell/phone within reach;with nursing/sitter in room Nurse Communication: Mobility status;Other (comment) PT Visit Diagnosis: Unsteadiness on feet (R26.81);Difficulty in walking, not elsewhere classified (R26.2);Other abnormalities of gait and mobility (R26.89)     Time: 1027-2536 PT Time Calculation (min) (ACUTE ONLY): 18 min  Charges:  $Therapeutic Exercise: 8-22 mins                     Lieutenant Diego PT, DPT 10:09 AM,02/21/20

## 2020-02-22 DIAGNOSIS — J1282 Pneumonia due to coronavirus disease 2019: Secondary | ICD-10-CM | POA: Diagnosis present

## 2020-02-22 DIAGNOSIS — U071 COVID-19: Secondary | ICD-10-CM | POA: Diagnosis present

## 2020-02-22 LAB — GLUCOSE, CAPILLARY
Glucose-Capillary: 233 mg/dL — ABNORMAL HIGH (ref 70–99)
Glucose-Capillary: 452 mg/dL — ABNORMAL HIGH (ref 70–99)
Glucose-Capillary: 398 mg/dL — ABNORMAL HIGH (ref 70–99)
Glucose-Capillary: 215 mg/dL — ABNORMAL HIGH (ref 70–99)
Glucose-Capillary: 131 mg/dL — ABNORMAL HIGH (ref 70–99)

## 2020-02-22 MED ORDER — INSULIN REGULAR HUMAN (CONC) 500 UNIT/ML ~~LOC~~ SOPN
75.0000 [IU] | PEN_INJECTOR | Freq: Three times a day (TID) | SUBCUTANEOUS | Status: DC
  Administered 2020-02-22: 75 [IU] via SUBCUTANEOUS

## 2020-02-22 NOTE — Progress Notes (Addendum)
FSBS 452- MD aware/ insulin given/ will recheck   15:22pm repeat  FSBS still elevated/ MD aware/ discharge orders discontinued / will monitor overnight.

## 2020-02-22 NOTE — Progress Notes (Addendum)
Progress Note    Raymond [name]  FXT:[id number] DOB: 1965-02-07  DOA: 02/05/2020 PCP: [name], NP      Brief Narrative:    Medical records reviewed and are as summarized below:  Raymond [name] is a 55 y.o. male       Assessment/Plan:   Principal Problem:   Acute hypoxemic respiratory failure due to COVID-19 [facility]) Active Problems:   OSA treated with Trilogy machine   Hypoxemia requiring supplemental oxygen   Morbid obesity ([place])   Chronic diastolic heart failure (HCC)   HTN (hypertension)   Diabetes ([place])   Chronic diastolic heart failure, NYHA class 1 ([place])   Uncontrolled type 2 diabetes mellitus with hyperglycemia ([place])   Morbid obesity with BMI of 50.0-59.9, adult ([place])   Pneumonia due to COVID-19 virus   Nutrition Problem: Increased nutrient needs Etiology: catabolic illness (pneumonia secondary to COVID-19 virus infection)  Signs/Symptoms: estimated needs   Body mass index is 49.41 kg/m.  (Morbid obesity)   PLAN  He completed IV remdesivir for COVID-pneumonia. He completed prednisone on 02/20/20.  Continue baricitinib for COVID-19 pneumonia.  Continue bronchodilators and incentive spirometry as needed.  Oxygen requirement is down to to 4 L/min via nasal cannula.  He said he uses 2 L/min oxygen at home and uses trilogy/BiPAP at night.  Continue torsemide for chronic diastolic CHF Continue antihypertensives  He developed severe hyperglycemia with glucose level of 452 today.  Hemoglobin A1c on this admission was 7.8.  He uses Levemir 89 units twice daily and Humalog 60 units 3 times daily with meals at home.  He said he has been doing okay on this regimen.  Increase insulin U 500 to 75 units 3 times daily and monitor glucose levels closely.  Plan to discharge home tomorrow if glucose levels are okay.   Diet Order            Diet - low sodium heart healthy           Diet Carb Modified Fluid consistency: Thin; Room  service appropriate? Yes  Diet effective now                    Consultants:  None  Procedures:  None    Medications:   . amLODipine  10 mg Oral Daily  . aspirin EC  81 mg Oral Daily  . baricitinib  4 mg Oral Daily  . Chlorhexidine Gluconate Cloth  6 each Topical Daily  . enoxaparin (LOVENOX) injection  85 mg Subcutaneous Q24H  . insulin aspart  0-20 Units Subcutaneous TID AC & HS  . insulin regular human CONCENTRATED  75 Units Subcutaneous TID WC  . Ipratropium-Albuterol  1 puff Inhalation QID  . linagliptin  5 mg Oral Daily  . losartan  50 mg Oral Daily  . metoprolol tartrate  12.5 mg Oral BID  . multivitamin with minerals  1 tablet Oral Daily  . pantoprazole  40 mg Oral Daily  . Ensure Max Protein  11 oz Oral BID BM  . sodium chloride flush  3 mL Intravenous Q12H  . torsemide  40 mg Oral Daily   Continuous Infusions: . sodium chloride Stopped (02/11/20 1927)     Anti-infectives (From admission, onward)   Start     Dose/Rate Route Frequency Ordered Stop   02/06/20 1000  remdesivir 100 mg in sodium chloride 0.9 % 100 mL IVPB       "Followed by" Linked Group Details   100  mg 200 mL/hr over 30 Minutes Intravenous Daily 02/05/20 1141 02/09/20 1344   02/05/20 1245  remdesivir 200 mg in sodium chloride 0.9% 250 mL IVPB       "Followed by" Linked Group Details   200 mg 580 mL/hr over 30 Minutes Intravenous Once 02/05/20 1141 02/05/20 1248             Family Communication/Anticipated D/C date and plan/Code Status   DVT prophylaxis:      Code Status: Full Code  Family Communication: None Disposition Plan:    Status is: Inpatient  Remains inpatient appropriate because:Unsafe d/c plan and Severe hypoxia   Dispo: The patient is from: Home              Anticipated d/c is to: Home              Anticipated d/c date is: 1 day              Patient currently is not medically stable to d/c.           Subjective:   Interval events noted.   He feels better.  No shortness of breath or chest pain.  Objective:    Vitals:   02/21/20 2034 02/22/20 0410 02/22/20 0906 02/22/20 1233  BP: (!) 116/56 (!) 107/53 (!) 148/77 130/71  Pulse: 80 72 74 77  Resp: 19 18 18  (!) 22  Temp: 98.4 F (36.9 C) 98.7 F (37.1 C) 98.2 F (36.8 C)   TempSrc: Oral  Oral   SpO2: 94% 100% 92% 96%  Weight:      Height:       No data found.   Intake/Output Summary (Last 24 hours) at 02/22/2020 1527 Last data filed at 02/22/2020 0916 Gross per 24 hour  Intake 240 ml  Output 1525 ml  Net -1285 ml   Filed Weights   02/18/20 0543 02/20/20 0202 02/21/20 0245  Weight: (!) 164.2 kg (!) 164.4 kg (!) 165.2 kg    Exam:  GEN: NAD SKIN: Warm and dry EYES: EOMI ENT: MMM CV: RRR PULM: CTA B ABD: soft, obese, NT, +BS CNS: AAO x 3, non focal EXT: No edema or tenderness    Data Reviewed:   I have personally reviewed following labs and imaging studies:  Labs: Labs show the following:   Basic Metabolic Panel: Recent Labs  Lab 02/16/20 0249 02/18/20 2149 02/19/20 1359 02/19/20 1733 02/20/20 0626  NA 136 128* 129*  --  135  K 4.5 5.5* 4.3  --  4.2  CL 91* 88* 90*  --  95*  CO2 32 27 27  --  29  GLUCOSE 357* 521* 388* 364* 167*  BUN 45* 59* 59*  --  48*  CREATININE 1.33* 1.62* 1.36*  --  1.11  CALCIUM 9.4 9.2 9.1  --  9.2  MG 2.4  --   --   --   --    GFR Estimated Creatinine Clearance: 118.3 mL/min (by C-G formula based on SCr of 1.11 mg/dL). Liver Function Tests: Recent Labs  Lab 02/16/20 0249  AST 18  ALT 28  ALKPHOS 57  BILITOT 0.7  PROT 7.9  ALBUMIN 3.2*   No results for input(s): LIPASE, AMYLASE in the last 168 hours. No results for input(s): AMMONIA in the last 168 hours. Coagulation profile No results for input(s): INR, PROTIME in the last 168 hours.  CBC: No results for input(s): WBC, NEUTROABS, HGB, HCT, MCV, PLT in the last 168 hours. Cardiac Enzymes:  No results for input(s): CKTOTAL, CKMB, CKMBINDEX,  TROPONINI in the last 168 hours. BNP (last 3 results) No results for input(s): PROBNP in the last 8760 hours. CBG: Recent Labs  Lab 02/21/20 1704 02/21/20 2036 02/22/20 0907 02/22/20 1237 02/22/20 1448  GLUCAP 254* 284* 233* 452* 398*   D-Dimer: No results for input(s): DDIMER in the last 72 hours. Hgb A1c: No results for input(s): HGBA1C in the last 72 hours. Lipid Profile: No results for input(s): CHOL, HDL, LDLCALC, TRIG, CHOLHDL, LDLDIRECT in the last 72 hours. Thyroid function studies: No results for input(s): TSH, T4TOTAL, T3FREE, THYROIDAB in the last 72 hours.  Invalid input(s): FREET3 Anemia work up: No results for input(s): VITAMINB12, FOLATE, FERRITIN, TIBC, IRON, RETICCTPCT in the last 72 hours. Sepsis Labs: No results for input(s): PROCALCITON, WBC, LATICACIDVEN in the last 168 hours.  Microbiology No results found for this or any previous visit (from the past 240 hour(s)).  Procedures and diagnostic studies:  No results found.             LOS: 17 days   Nora Copywriter, advertising on [email or web address]. If 7PM-7AM, please contact night-coverage at [email or web address]     02/22/2020, 3:27 PM

## 2020-02-22 NOTE — Discharge Summary (Addendum)
Physician Discharge Summary  Raymond [name] [id number]:[id number] DOB: Oct 07, 1965 DOA: 02/05/2020  PCP: Raymond Cara, NP  Admit date: 02/05/2020 Discharge date: 02/22/2020  Discharge disposition: Home   Recommendations for Outpatient Follow-Up:   Follow up with PCP in 1 week   Discharge Diagnosis:   Principal Problem:   Acute hypoxemic respiratory failure due to COVID-19 [facility]) Active Problems:   OSA treated with Trilogy machine   Hypoxemia requiring supplemental oxygen   Morbid obesity (HCC)   Chronic diastolic heart failure (HCC)   HTN (hypertension)   Diabetes (HCC)   Chronic diastolic heart failure, NYHA class 1 ([place])   Uncontrolled type 2 diabetes mellitus with hyperglycemia ([place])   Morbid obesity with BMI of 50.0-59.9, adult ([place])    Discharge Condition: Stable.  Diet recommendation:  Diet Order            Diet - low sodium heart healthy           Diet Carb Modified Fluid consistency: Thin; Room service appropriate? Yes  Diet effective now                   Code Status: Full Code     Hospital Course:   [name] is a 55 year old man with medical history significant for morbid obesity, type II DM, diabetic neuropathy, OSA/OHS on trilogy at home, chronic hypoxic and hypercapnic respiratory failure on 2 L of oxygen at home, chronic diastolic CHF, PVD.  He presented to the hospital with cough, increasing shortness of breath and subjective fever.  He had to go up on his oxygen to 3 L for worsening hypoxia.  He was admitted to the hospital for [id number] pneumonia complicated by acute hypoxic respiratory failure. He was treated with steroids, IV remdesivir and baricitinib. He required as much as 15 L/min oxygen via high flow nasal cannula. He was also treated with IV Lasix for probable acute on chronic diastolic CHF. He developed severe hyperglycemia requiring high-dose insulin for glucose control. His condition slowly improved and oxygen was  eventually tapered down to 2 L/min which is his baseline. He is deemed stable for discharge to home. He did not want his home insulin regimen to be changed at discharge because he said his home regimen has worked well for him at home. His hemoglobin A1c was 7.8.     Discharge Exam:    Vitals:   02/21/20 2034 02/22/20 0410 02/22/20 0906 02/22/20 1233  BP: (!) 116/56 (!) 107/53 (!) 148/77 130/71  Pulse: 80 72 74 77  Resp: 19 18 18  (!) 22  Temp: 98.4 F (36.9 C) 98.7 F (37.1 C) 98.2 F (36.8 C)   TempSrc: Oral  Oral   SpO2: 94% 100% 92% 96%  Weight:      Height:         GEN: NAD SKIN: Warm and dry EYES: EOMI ENT: MMM CV: RRR PULM: CTA B ABD: soft, obese, NT, +BS CNS: AAO x 3, non focal EXT: No edema or tenderness     The results of significant diagnostics from this hospitalization (including imaging, microbiology, ancillary and laboratory) are listed below for reference.     Procedures and Diagnostic Studies:   DG Chest Portable 1 View  Result Date: 02/05/2020 CLINICAL DATA:  Shortness of breath EXAM: PORTABLE CHEST 1 VIEW COMPARISON:  04/28/2017 FINDINGS: Bibasilar airspace disease which may reflect atelectasis versus pneumonia. No pleural effusion or pneumothorax. Heart and mediastinal contours are unremarkable. No acute osseous abnormality. IMPRESSION: Bibasilar  airspace disease which may reflect atelectasis versus pneumonia. Electronically Signed   By: Kathreen Devoid   On: 02/05/2020 11:13     Labs:   Basic Metabolic Panel: Recent Labs  Lab 02/16/20 0249 02/18/20 2149 02/19/20 1359 02/19/20 1733 02/20/20 0626  NA 136 128* 129*  --  135  K 4.5 5.5* 4.3  --  4.2  CL 91* 88* 90*  --  95*  CO2 32 27 27  --  29  GLUCOSE 357* 521* 388* 364* 167*  BUN 45* 59* 59*  --  48*  CREATININE 1.33* 1.62* 1.36*  --  1.11  CALCIUM 9.4 9.2 9.1  --  9.2  MG 2.4  --   --   --   --    GFR Estimated Creatinine Clearance: 118.3 mL/min (by C-G formula based on SCr of 1.11  mg/dL). Liver Function Tests: Recent Labs  Lab 02/16/20 0249  AST 18  ALT 28  ALKPHOS 57  BILITOT 0.7  PROT 7.9  ALBUMIN 3.2*   No results for input(s): LIPASE, AMYLASE in the last 168 hours. No results for input(s): AMMONIA in the last 168 hours. Coagulation profile No results for input(s): INR, PROTIME in the last 168 hours.  CBC: No results for input(s): WBC, NEUTROABS, HGB, HCT, MCV, PLT in the last 168 hours. Cardiac Enzymes: No results for input(s): CKTOTAL, CKMB, CKMBINDEX, TROPONINI in the last 168 hours. BNP: Invalid input(s): POCBNP CBG: Recent Labs  Lab 02/21/20 1223 02/21/20 1704 02/21/20 2036 02/22/20 0907 02/22/20 1237  GLUCAP 297* 254* 284* 233* 452*   D-Dimer No results for input(s): DDIMER in the last 72 hours. Hgb A1c No results for input(s): HGBA1C in the last 72 hours. Lipid Profile No results for input(s): CHOL, HDL, LDLCALC, TRIG, CHOLHDL, LDLDIRECT in the last 72 hours. Thyroid function studies No results for input(s): TSH, T4TOTAL, T3FREE, THYROIDAB in the last 72 hours.  Invalid input(s): FREET3 Anemia work up No results for input(s): VITAMINB12, FOLATE, FERRITIN, TIBC, IRON, RETICCTPCT in the last 72 hours. Microbiology No results found for this or any previous visit (from the past 240 hour(s)).   Discharge Instructions:   Discharge Instructions    Diet - low sodium heart healthy   Complete by: As directed    Discharge instructions   Complete by: As directed    Continue to wear 4 L/min oxygen at home.  You may be able to taper down to 2 L/min oxygen (your baseline oxygen requirement) if possible.  Continue to use trilogy machine at night.  Monitor glucose closely at home and follow-up with PCP to make any changes to insulin regimen if necessary.   Increase activity slowly   Complete by: As directed      Allergies as of 02/22/2020   No Known Allergies     Medication List    STOP taking these medications   cyclobenzaprine 5 MG  tablet Commonly known as: FLEXERIL   Dulaglutide 0.75 MG/0.5ML Sopn   oxyCODONE-acetaminophen 5-325 MG tablet Commonly known as: Percocet   polyethylene glycol 236 g solution Commonly known as: GoLYTELY     TAKE these medications   Accu-Chek SmartView test strip Generic drug: glucose blood   amLODipine 10 MG tablet Commonly known as: NORVASC amlodipine 10 mg tablet   aspirin EC 81 MG tablet Take 81 mg by mouth daily.   atorvastatin 40 MG tablet Commonly known as: LIPITOR atorvastatin 40 mg tablet   carvedilol 6.25 MG tablet Commonly known as: COREG Take 1  tablet (6.25 mg total) by mouth 2 (two) times daily.   Centrum Silver 50+Men Tabs Take 1 tablet by mouth daily.   gabapentin 300 MG capsule Commonly known as: NEURONTIN Take 300 mg by mouth 3 (three) times daily.   insulin detemir 100 UNIT/ML injection Commonly known as: LEVEMIR Inject 89 Units into the skin 2 (two) times daily.   insulin lispro 100 UNIT/ML cartridge Commonly known as: HUMALOG Inject 60 Units into the skin 3 (three) times daily with meals.   losartan 50 MG tablet Commonly known as: COZAAR Take 50 mg by mouth daily.   metFORMIN 1000 MG tablet Commonly known as: GLUCOPHAGE Take 1,000 mg by mouth 2 (two) times daily with a meal.   omega-3 acid ethyl esters 1 g capsule Commonly known as: LOVAZA Take 1 g by mouth daily.   pantoprazole 40 MG tablet Commonly known as: PROTONIX Take 40 mg by mouth daily.   torsemide 20 MG tablet Commonly known as: DEMADEX Take 2 tablets (40 mg total) by mouth daily.   TRUEplus Insulin Syringe 29G X 1/2" 1 ML Misc Generic drug: INSULIN SYRINGE 1CC/29G   TRUEplus Pen Needles 31G X 8 MM Misc Generic drug: Insulin Pen Needle         Time coordinating discharge: 31 minutes  Signed:  Hoa Briggs  [facility] 02/22/2020, 1:43 PM   Pager on [email or web address]. If 7PM-7AM, please contact night-coverage at [email or web address]

## 2020-02-23 LAB — GLUCOSE, CAPILLARY: Glucose-Capillary: 97 mg/dL (ref 70–99)

## 2020-02-23 MED ORDER — INSULIN REGULAR HUMAN (CONC) 500 UNIT/ML ~~LOC~~ SOPN: 50.0000 [IU] | PEN_INJECTOR | Freq: Three times a day (TID) | SUBCUTANEOUS | Status: DC

## 2020-02-23 NOTE — Progress Notes (Signed)
Patient discharged at this time via wheelchair with all belongings to home. Patient verbalized understanding of discharge instructions. R/F ordered meds prior to leaving stated he would take at home. NAD noted upon departure. Vitals WNL.

## 2021-12-09 ENCOUNTER — Telehealth: Payer: Self-pay

## 2021-12-09 ENCOUNTER — Other Ambulatory Visit: Payer: Self-pay

## 2021-12-09 DIAGNOSIS — Z86010 Personal history of colonic polyps: Secondary | ICD-10-CM

## 2021-12-09 MED ORDER — NA SULFATE-K SULFATE-MG SULF 17.5-3.13-1.6 GM/177ML PO SOLN: 1.0000 | Freq: Once | ORAL | 0 refills | Status: AC

## 2021-12-09 NOTE — Telephone Encounter (Signed)
Gastroenterology Pre-Procedure Review  Request Date: 12/27/21 Requesting Physician: Dr. Vicente Males  PATIENT REVIEW QUESTIONS: The patient responded to the following health history questions as indicated:    1. Are you having any GI issues? no 2. Do you have a personal history of Polyps? yes (last colonoscopy was performed by Dr. Gustavo Lah 05/17/16) 3. Do you have a family history of Colon Cancer or Polyps? no 4. Diabetes Mellitus? yes (patient has been advised to stop Metformin 2 days prior to colonoscopy) 5. Joint replacements in the past 12 months?no 6. Major health problems in the past 3 months?no 7. Any artificial heart valves, MVP, or defibrillator? CHF Cardiologist Dr. Nehemiah Massed    MEDICATIONS & ALLERGIES:    Patient reports the following regarding taking any anticoagulation/antiplatelet therapy:   Plavix, Coumadin, Eliquis, Xarelto, Lovenox, Pradaxa, Brilinta, or Effient? no Aspirin? yes ('81mg'$  daily)  Patient confirms/reports the following medications:  Current Outpatient Medications  Medication Sig Dispense Refill   ACCU-CHEK SMARTVIEW test strip      aspirin EC 81 MG tablet Take 81 mg by mouth daily.     atorvastatin (LIPITOR) 40 MG tablet atorvastatin 40 mg tablet     carvedilol (COREG) 6.25 MG tablet Take 1 tablet (6.25 mg total) by mouth 2 (two) times daily. 60 tablet 11   gabapentin (NEURONTIN) 300 MG capsule Take 300 mg by mouth 3 (three) times daily.     insulin detemir (LEVEMIR) 100 UNIT/ML injection Inject 89 Units into the skin 2 (two) times daily.      insulin lispro (HUMALOG) 100 UNIT/ML cartridge Inject 60 Units into the skin 3 (three) times daily with meals.     losartan (COZAAR) 50 MG tablet Take 50 mg by mouth daily.     metFORMIN (GLUCOPHAGE) 1000 MG tablet Take 1,000 mg by mouth 2 (two) times daily with a meal.     Multiple Vitamins-Minerals (CENTRUM SILVER 50+MEN) TABS Take 1 tablet by mouth daily.     omega-3 acid ethyl esters (LOVAZA) 1 g capsule Take 1 g by  mouth daily.     pantoprazole (PROTONIX) 40 MG tablet Take 40 mg by mouth daily.     torsemide (DEMADEX) 20 MG tablet Take 2 tablets (40 mg total) by mouth daily. 60 tablet 2   TRUEPLUS INSULIN SYRINGE 29G X 1/2" 1 ML MISC      TRUEPLUS PEN NEEDLES 31G X 8 MM MISC      No current facility-administered medications for this visit.    Patient confirms/reports the following allergies:  No Known Allergies  No orders of the defined types were placed in this encounter.   AUTHORIZATION INFORMATION Primary Insurance: 1D#: Group #:  Secondary Insurance: 1D#: Group #:  SCHEDULE INFORMATION: Date: 12/27/21 Time: Location: [facility]

## 2021-12-21 ENCOUNTER — Telehealth: Payer: Self-pay

## 2021-12-21 NOTE — Telephone Encounter (Signed)
Cardiac clearance has been granted on 12/21/21 by Dr. Nehemiah Massed for patients scheduled colonoscopy 12/27/21 with Dr. Vicente Males at [facility].  Thanks,  [place], [place]

## 2021-12-27 ENCOUNTER — Encounter: Payer: Self-pay | Admitting: Gastroenterology

## 2021-12-27 ENCOUNTER — Ambulatory Visit: Payer: Medicare HMO | Admitting: Anesthesiology

## 2021-12-27 ENCOUNTER — Ambulatory Visit
Admission: RE | Admit: 2021-12-27 | Discharge: 2021-12-27 | Disposition: A | Discharge status: Home / Self Care | Payer: Medicare HMO | Attending: Gastroenterology | Admitting: Gastroenterology

## 2021-12-27 ENCOUNTER — Encounter
Admission: RE | Disposition: A | Discharge status: Home / Self Care | Payer: Self-pay | Source: Home / Self Care | Attending: Gastroenterology

## 2021-12-27 DIAGNOSIS — G4733 Obstructive sleep apnea (adult) (pediatric): Secondary | ICD-10-CM | POA: Insufficient documentation

## 2021-12-27 DIAGNOSIS — E1151 Type 2 diabetes mellitus with diabetic peripheral angiopathy without gangrene: Secondary | ICD-10-CM | POA: Diagnosis not present

## 2021-12-27 DIAGNOSIS — Z79899 Other long term (current) drug therapy: Secondary | ICD-10-CM | POA: Insufficient documentation

## 2021-12-27 DIAGNOSIS — K760 Fatty (change of) liver, not elsewhere classified: Secondary | ICD-10-CM | POA: Diagnosis not present

## 2021-12-27 DIAGNOSIS — D123 Benign neoplasm of transverse colon: Secondary | ICD-10-CM | POA: Diagnosis not present

## 2021-12-27 DIAGNOSIS — Z7984 Long term (current) use of oral hypoglycemic drugs: Secondary | ICD-10-CM | POA: Insufficient documentation

## 2021-12-27 DIAGNOSIS — Z9981 Dependence on supplemental oxygen: Secondary | ICD-10-CM | POA: Insufficient documentation

## 2021-12-27 DIAGNOSIS — D122 Benign neoplasm of ascending colon: Secondary | ICD-10-CM | POA: Insufficient documentation

## 2021-12-27 DIAGNOSIS — D126 Benign neoplasm of colon, unspecified: Secondary | ICD-10-CM

## 2021-12-27 DIAGNOSIS — I5042 Chronic combined systolic (congestive) and diastolic (congestive) heart failure: Secondary | ICD-10-CM | POA: Insufficient documentation

## 2021-12-27 DIAGNOSIS — Z1211 Encounter for screening for malignant neoplasm of colon: Secondary | ICD-10-CM | POA: Diagnosis present

## 2021-12-27 DIAGNOSIS — Z6841 Body Mass Index (BMI) 40.0 and over, adult: Secondary | ICD-10-CM | POA: Diagnosis not present

## 2021-12-27 DIAGNOSIS — D120 Benign neoplasm of cecum: Secondary | ICD-10-CM | POA: Insufficient documentation

## 2021-12-27 DIAGNOSIS — I110 Hypertensive heart disease with heart failure: Secondary | ICD-10-CM | POA: Diagnosis not present

## 2021-12-27 DIAGNOSIS — K621 Rectal polyp: Secondary | ICD-10-CM | POA: Diagnosis not present

## 2021-12-27 DIAGNOSIS — K219 Gastro-esophageal reflux disease without esophagitis: Secondary | ICD-10-CM | POA: Diagnosis not present

## 2021-12-27 DIAGNOSIS — J449 Chronic obstructive pulmonary disease, unspecified: Secondary | ICD-10-CM | POA: Insufficient documentation

## 2021-12-27 DIAGNOSIS — Z794 Long term (current) use of insulin: Secondary | ICD-10-CM | POA: Insufficient documentation

## 2021-12-27 DIAGNOSIS — Z86010 Personal history of colonic polyps: Secondary | ICD-10-CM

## 2021-12-27 HISTORY — PX: COLONOSCOPY WITH PROPOFOL: SHX5780

## 2021-12-27 LAB — GLUCOSE, CAPILLARY: Glucose-Capillary: 206 mg/dL — ABNORMAL HIGH (ref 70–99)

## 2021-12-27 SURGERY — COLONOSCOPY WITH PROPOFOL
Anesthesia: General

## 2021-12-27 MED ORDER — FENTANYL CITRATE (PF) 100 MCG/2ML IJ SOLN
INTRAMUSCULAR | Status: DC | PRN
  Administered 2021-12-27: 50 ug via INTRAVENOUS

## 2021-12-27 MED ORDER — GLYCOPYRROLATE 0.2 MG/ML IJ SOLN
INTRAMUSCULAR | Status: AC
  Filled 2021-12-27: qty 1

## 2021-12-27 MED ORDER — ONDANSETRON HCL 4 MG/2ML IJ SOLN
INTRAMUSCULAR | Status: DC | PRN
  Administered 2021-12-27: 4 mg via INTRAVENOUS

## 2021-12-27 MED ORDER — GLYCOPYRROLATE 0.2 MG/ML IJ SOLN
INTRAMUSCULAR | Status: DC | PRN
  Administered 2021-12-27: .2 mg via INTRAVENOUS

## 2021-12-27 MED ORDER — SUCCINYLCHOLINE CHLORIDE 200 MG/10ML IV SOSY
PREFILLED_SYRINGE | INTRAVENOUS | Status: DC | PRN
  Administered 2021-12-27: 200 mg via INTRAVENOUS

## 2021-12-27 MED ORDER — PROPOFOL 10 MG/ML IV BOLUS
INTRAVENOUS | Status: DC | PRN
  Administered 2021-12-27: 50 mg via INTRAVENOUS
  Administered 2021-12-27: 300 mg via INTRAVENOUS

## 2021-12-27 MED ORDER — DEXMEDETOMIDINE HCL IN NACL 200 MCG/50ML IV SOLN
INTRAVENOUS | Status: DC | PRN
  Administered 2021-12-27: 4 ug via INTRAVENOUS

## 2021-12-27 MED ORDER — LIDOCAINE HCL (CARDIAC) PF 100 MG/5ML IV SOSY
PREFILLED_SYRINGE | INTRAVENOUS | Status: DC | PRN
  Administered 2021-12-27: 100 mg via INTRAVENOUS

## 2021-12-27 MED ORDER — PHENYLEPHRINE HCL (PRESSORS) 10 MG/ML IV SOLN
INTRAVENOUS | Status: DC | PRN
  Administered 2021-12-27 (×3): 160 ug via INTRAVENOUS

## 2021-12-27 MED ORDER — ONDANSETRON HCL 4 MG/2ML IJ SOLN
INTRAMUSCULAR | Status: AC
  Filled 2021-12-27: qty 2

## 2021-12-27 MED ORDER — FENTANYL CITRATE (PF) 100 MCG/2ML IJ SOLN
INTRAMUSCULAR | Status: AC
  Filled 2021-12-27: qty 2

## 2021-12-27 MED ORDER — EPHEDRINE SULFATE-NACL 50-0.9 MG/10ML-% IV SOSY
PREFILLED_SYRINGE | INTRAVENOUS | Status: DC | PRN
  Administered 2021-12-27: 15 mg via INTRAVENOUS

## 2021-12-27 MED ORDER — SEVOFLURANE IN SOLN
RESPIRATORY_TRACT | Status: AC
  Filled 2021-12-27: qty 250

## 2021-12-27 MED ORDER — DEXAMETHASONE SODIUM PHOSPHATE 10 MG/ML IJ SOLN
INTRAMUSCULAR | Status: AC
  Filled 2021-12-27: qty 1

## 2021-12-27 MED ORDER — LIDOCAINE HCL (PF) 2 % IJ SOLN
INTRAMUSCULAR | Status: AC
  Filled 2021-12-27: qty 5

## 2021-12-27 MED ORDER — SODIUM CHLORIDE 0.9 % IV SOLN: INTRAVENOUS | Status: DC

## 2021-12-27 MED ORDER — DEXAMETHASONE SODIUM PHOSPHATE 10 MG/ML IJ SOLN
INTRAMUSCULAR | Status: DC | PRN
  Administered 2021-12-27: 10 mg via INTRAVENOUS

## 2021-12-27 MED ORDER — SUCCINYLCHOLINE CHLORIDE 200 MG/10ML IV SOSY
PREFILLED_SYRINGE | INTRAVENOUS | Status: AC
  Filled 2021-12-27: qty 10

## 2021-12-27 NOTE — Anesthesia Procedure Notes (Signed)
Procedure Name: Intubation Date/Time: 12/27/2021 9:50 AM  Performed by: Doreen Salvage, CRNAPre-anesthesia Checklist: Patient identified, Emergency Drugs available, Suction available and Patient being monitored Patient Re-evaluated:Patient Re-evaluated prior to induction Oxygen Delivery Method: Circle system utilized Preoxygenation: Pre-oxygenation with 100% oxygen Induction Type: IV induction, Cricoid Pressure applied and Rapid sequence Ventilation: Mask ventilation without difficulty Laryngoscope Size: Mac and 4 Grade View: Grade II Tube type: Oral Tube size: 7.5 mm Number of attempts: 1 Airway Equipment and Method: Stylet Placement Confirmation: ETT inserted through vocal cords under direct vision, positive ETCO2 and breath sounds checked- equal and bilateral Secured at: 23 cm Tube secured with: Tape Dental Injury: Teeth and Oropharynx as per pre-operative assessment

## 2021-12-27 NOTE — Anesthesia Preprocedure Evaluation (Signed)
Anesthesia Evaluation  Patient identified by MRN, date of birth, ID band Patient awake    Reviewed: Allergy & Precautions, NPO status , Patient's Chart, lab work & pertinent test results  History of Anesthesia Complications Negative for: history of anesthetic complications  Airway Mallampati: III  TM Distance: >3 FB Neck ROM: full    Dental  (+) Missing, Chipped, Poor Dentition   Pulmonary neg shortness of breath, sleep apnea , COPD   Pulmonary exam normal        Cardiovascular Exercise Tolerance: Good hypertension, + Peripheral Vascular Disease, +CHF and + Orthopnea  Normal cardiovascular exam     Neuro/Psych negative neurological ROS  negative psych ROS   GI/Hepatic Neg liver ROS,GERD  Controlled,,  Endo/Other  negative endocrine ROSdiabetes, Type 2    Renal/GU Renal disease     Musculoskeletal   Abdominal   Peds  Hematology negative hematology ROS (+)   Anesthesia Other Findings Past Medical History: No date: Arthritis     Comment:  hands No date: Cellulitis No date: CHF (congestive heart failure) (HCC) No date: Chronic diastolic heart failure (HCC) No date: Chronic respiratory failure with hypoxia (HCC)     Comment:  2 L [place] continuously No date: Diabetes mellitus type 2, insulin dependent (HCC) No date: Edema     Comment:  FEET/LEGS No date: GERD (gastroesophageal reflux disease) No date: Hepatic steatosis No date: HOH (hard of hearing) No date: Hypertension No date: Morbid obesity ([place]) No date: Morbid obesity with BMI of 45.0-49.9, adult (HCC) No date: Neuropathy No date: Orthopnea No date: OSA on CPAP     Comment:  TRILOGY VENTILATOR No date: Oxygen deficiency     Comment:  2L  HS No date: PVD (peripheral vascular disease) (HCC) No date: Shortness of breath dyspnea No date: Systolic heart failure (HCC)     Comment:  Preserved EF 50-55%  Past Surgical History: 10/02/2014: AMPUTATION; Right      Comment:  Procedure: AMPUTATION RAY;  Surgeon: Albertine Patricia,               DPM;  Location: [facility] ORS;  Service: Podiatry;                Laterality: Right; 09/10/2015: CATARACT EXTRACTION W/PHACO; Right     Comment:  Procedure: CATARACT EXTRACTION PHACO AND INTRAOCULAR               LENS PLACEMENT (IOC);  Surgeon: Birder Robson, MD;                Location: [facility] ORS;  Service: Ophthalmology;  Laterality:              Right;  [place] 01:34 AP% 19.3 CDE 18.36 Fluid pack lot #               [id number] 10/06/2015: CATARACT EXTRACTION W/PHACO; Left     Comment:  Procedure: CATARACT EXTRACTION PHACO AND INTRAOCULAR               LENS PLACEMENT (IOC);  Surgeon: Birder Robson, MD;                Location: [facility] ORS;  Service: Ophthalmology;  Laterality:              Left;  [place] 00:56 AP% 19.5 CDE 11.02 Fluid Pack #               [id number] No date: CHOLECYSTECTOMY 05/17/2016: COLONOSCOPY WITH PROPOFOL; N/A     Comment:  Procedure:  COLONOSCOPY WITH PROPOFOL;  Surgeon: Lollie Sails, MD;  Location: [facility] ENDOSCOPY;  Service:               Endoscopy;  Laterality: N/A; No date: EYE SURGERY; Left     Comment:  bilat laser No date: HIP SURGERY; Right     Comment:  8 th grade pins No date: NOSE SURGERY 10/02/2014: PERIPHERAL VASCULAR CATHETERIZATION; Right     Comment:  Procedure: Lower Extremity Angiography;  Surgeon: Algernon Huxley, MD;  Location: [facility];  Service:               Cardiovascular;  Laterality: Right; 2010: TOE AMPUTATION; Left     Comment:  2cd 2009: TOE AMPUTATION; Right     Reproductive/Obstetrics negative OB ROS                             Anesthesia Physical Anesthesia Plan  ASA: 3  Anesthesia Plan: General ETT, Rapid Sequence and Cricoid Pressure   Post-op Pain Management:    Induction: Intravenous  PONV Risk Score and Plan: Ondansetron, Dexamethasone, Midazolam and Treatment may vary due to age or  medical condition  Airway Management Planned: Oral ETT and Video Laryngoscope Planned  Additional Equipment:   Intra-op Plan:   Post-operative Plan: Extubation in OR  Informed Consent: I have reviewed the patients History and Physical, chart, labs and discussed the procedure including the risks, benefits and alternatives for the proposed anesthesia with the patient or authorized representative who has indicated his/her understanding and acceptance.     Dental Advisory Given  Plan Discussed with: Anesthesiologist, CRNA and Surgeon  Anesthesia Plan Comments: (Patient reports active GLP-1 agonist use.  After discussing (with both the patient and the surgeon) the risks of proceeding (aspiration, lung damage and death) and given the option to reschedule the patient declines to reschedule and assents to proceed with rapid sequence intubation for the procedure per ASA guidance.     Patient consented for risks of anesthesia including but not limited to:  - adverse reactions to medications - damage to eyes, teeth, lips or other oral mucosa - nerve damage due to positioning  - sore throat or hoarseness - Damage to heart, brain, nerves, lungs, other parts of body or loss of life  Patient voiced understanding.)       Anesthesia Quick Evaluation

## 2021-12-27 NOTE — Op Note (Signed)
[facility] Gastroenterology Patient Name: Raymond [name] Procedure Date: 12/27/2021 9:40 AM MRN: [id number] Account #: [id number] Date of Birth: [birth year] Admit Type: Outpatient Age: 56 Room: [facility] ENDO ROOM 1 Gender: Male Note Status: Finalized Instrument Name: Jasper Riling [id number] Procedure:             Colonoscopy Indications:           Screening for colorectal malignant neoplasm Providers:             Jonathon Bellows MD, MD Referring MD:          [name], NP Medicines:             Monitored Anesthesia Care Complications:         No immediate complications. Procedure:             Pre-Anesthesia Assessment:                        - Prior to the procedure, a History and Physical was                         performed, and patient medications, allergies and                         sensitivities were reviewed. The patient's tolerance                         of previous anesthesia was reviewed.                        - The risks and benefits of the procedure and the                         sedation options and risks were discussed with the                         patient. All questions were answered and informed                         consent was obtained.                        - The risks and benefits of the procedure and the                         sedation options and risks were discussed with the                         patient. All questions were answered and informed                         consent was obtained.                        After obtaining informed consent, the colonoscope was                         passed under direct vision. Throughout the procedure,                         the  patient's blood pressure, pulse, and oxygen                         saturations were monitored continuously. The                         Colonoscope was introduced through the anus and                         advanced to the the cecum, identified by the                          appendiceal orifice. The colonoscopy was performed                         with ease. The patient tolerated the procedure well.                         The quality of the bowel preparation was excellent.                         The appendiceal orifice was photographed. Findings:      The perianal and digital rectal examinations were normal.      Four sessile polyps were found in the rectum, transverse colon,       ascending colon and cecum. The polyps were 6 to 8 mm in size. These       polyps were removed with a cold snare. Resection and retrieval were       complete.      The exam was otherwise without abnormality on direct and retroflexion       views. Impression:            - Four 6 to 8 mm polyps in the rectum, in the                         transverse colon, in the ascending colon and in the                         cecum, removed with a cold snare. Resected and                         retrieved.                        - The examination was otherwise normal on direct and                         retroflexion views. Recommendation:        - Discharge patient to home (with escort).                        - Resume previous diet.                        - Continue present medications.                        - Await pathology results.                        -  Repeat colonoscopy in 3 years for surveillance. Procedure Code(s):     --- Professional ---                        [phone], Colonoscopy, flexible; with removal of                         tumor(s), polyp(s), or other lesion(s) by snare                         technique Diagnosis Code(s):     --- Professional ---                        Z12.11, Encounter for screening for malignant neoplasm                         of colon                        D12.8, Benign neoplasm of rectum                        D12.3, Benign neoplasm of transverse colon (hepatic                         flexure or splenic flexure)                         D12.2, Benign neoplasm of ascending colon                        D12.0, Benign neoplasm of cecum CPT copyright 2022 American Medical Association. All rights reserved. The codes documented in this report are preliminary and upon coder review may  be revised to meet current compliance requirements. Jonathon Bellows, MD Jonathon Bellows MD, MD 12/27/2021 10:21:58 AM This report has been signed electronically. Number of Addenda: 0 Note Initiated On: 12/27/2021 9:40 AM Scope Withdrawal Time: 0 hours 15 minutes 5 seconds  Total Procedure Duration: 0 hours 18 minutes 46 seconds  Estimated Blood Loss:  Estimated blood loss: none.      [facility]

## 2021-12-27 NOTE — Transfer of Care (Signed)
Immediate Anesthesia Transfer of Care Note  Patient: Raymond [name]  Procedure(s) Performed: Procedure(s): COLONOSCOPY WITH PROPOFOL (N/A)  Patient Location: PACU and Endoscopy Unit  Anesthesia Type:General  Level of Consciousness: sedated  Airway & Oxygen Therapy: Patient Spontanous Breathing and Patient connected to nasal cannula oxygen  Post-op Assessment: Report given to RN and Post -op Vital signs reviewed and stable  Post vital signs: Reviewed and stable  Last Vitals:  Vitals:   12/27/21 0851 12/27/21 1045  BP: 130/66 123/60  Pulse: 78   Resp: 18   Temp: (!) 36 C (!) 35.9 C  SpO2: 01%     Complications: No apparent anesthesia complications

## 2021-12-27 NOTE — Anesthesia Postprocedure Evaluation (Signed)
Anesthesia Post Note  Patient: Raymond [name]  Procedure(s) Performed: COLONOSCOPY WITH PROPOFOL  Patient location during evaluation: Endoscopy Anesthesia Type: General Level of consciousness: awake and alert Pain management: pain level controlled Vital Signs Assessment: post-procedure vital signs reviewed and stable Respiratory status: spontaneous breathing, nonlabored ventilation, respiratory function stable and patient connected to nasal cannula oxygen Cardiovascular status: blood pressure returned to baseline and stable Postop Assessment: no apparent nausea or vomiting Anesthetic complications: no   No notable events documented.   Last Vitals:  Vitals:   12/27/21 1105 12/27/21 1115  BP: 114/61   Pulse:    Resp: 18   Temp:    SpO2:  97%    Last Pain:  Vitals:   12/27/21 1115  TempSrc:   PainSc: 0-No pain                 [name]

## 2021-12-27 NOTE — H&P (Signed)
Jonathon Bellows, MD [address], [place], [place], [place], [address] [address], [place], [place], [place], [address] Phone: [phone]  Fax: [phone]  Primary Care Physician:  [name], NP   Pre-Procedure History & Physical: HPI:  Raymond [name] is a 56 y.o. male is here for an colonoscopy.   Past Medical History:  Diagnosis Date   Arthritis    hands   Cellulitis    CHF (congestive heart failure) (HCC)    Chronic diastolic heart failure (HCC)    Chronic respiratory failure with hypoxia (HCC)    2 L [place] continuously   Diabetes mellitus type 2, insulin dependent (HCC)    Edema    FEET/LEGS   GERD (gastroesophageal reflux disease)    Hepatic steatosis    HOH (hard of hearing)    Hypertension    Morbid obesity ([place])    Morbid obesity with BMI of 45.0-49.9, adult (HCC)    Neuropathy    Orthopnea    OSA on CPAP    TRILOGY VENTILATOR   Oxygen deficiency    2L  HS   PVD (peripheral vascular disease) (HCC)    Shortness of breath dyspnea    Systolic heart failure (HCC)    Preserved EF 50-55%    Past Surgical History:  Procedure Laterality Date   AMPUTATION Right 10/02/2014   Procedure: AMPUTATION RAY;  Surgeon: Albertine Patricia, DPM;  Location: [facility] ORS;  Service: Podiatry;  Laterality: Right;   CATARACT EXTRACTION W/PHACO Right 09/10/2015   Procedure: CATARACT EXTRACTION PHACO AND INTRAOCULAR LENS PLACEMENT (IOC);  Surgeon: Birder Robson, MD;  Location: [facility] ORS;  Service: Ophthalmology;  Laterality: Right;  [place] 01:34 AP% 19.3 CDE 18.36 Fluid pack lot # [id number]   CATARACT EXTRACTION W/PHACO Left 10/06/2015   Procedure: CATARACT EXTRACTION PHACO AND INTRAOCULAR LENS PLACEMENT (IOC);  Surgeon: Birder Robson, MD;  Location: [facility] ORS;  Service: Ophthalmology;  Laterality: Left;  [place] 00:56 AP% 19.5 CDE 11.02 Fluid Pack # [id number]   CHOLECYSTECTOMY     COLONOSCOPY WITH PROPOFOL N/A 05/17/2016   Procedure: COLONOSCOPY WITH PROPOFOL;  Surgeon:  Lollie Sails, MD;  Location: [facility] ENDOSCOPY;  Service: Endoscopy;  Laterality: N/A;   EYE SURGERY Left    bilat laser   HIP SURGERY Right    8 th grade pins   NOSE SURGERY     PERIPHERAL VASCULAR CATHETERIZATION Right 10/02/2014   Procedure: Lower Extremity Angiography;  Surgeon: Algernon Huxley, MD;  Location: [facility];  Service: Cardiovascular;  Laterality: Right;   TOE AMPUTATION Left 2010   2cd   TOE AMPUTATION Right 2009    Prior to Admission medications   Medication Sig Start Date End Date Taking? Authorizing Provider  aspirin EC 81 MG tablet Take 81 mg by mouth daily.   Yes [provider]  atorvastatin (LIPITOR) 40 MG tablet atorvastatin 40 mg tablet   Yes [provider]  Empagliflozin (JARDIANCE PO) Take by mouth daily.   Yes [provider]  gabapentin (NEURONTIN) 300 MG capsule Take 300 mg by mouth 3 (three) times daily.   Yes [provider]  insulin detemir (LEVEMIR) 100 UNIT/ML injection Inject 89 Units into the skin 2 (two) times daily.    Yes [provider]  insulin lispro (HUMALOG) 100 UNIT/ML cartridge Inject 60 Units into the skin 3 (three) times daily with meals.   Yes [provider]  liraglutide (VICTOZA) 18 MG/3ML SOPN Inject into the skin daily.   Yes [provider]  losartan (COZAAR) 50 MG tablet Take 50 mg by mouth daily.   Yes [provider]  metFORMIN (GLUCOPHAGE) 1000 MG tablet Take 1,000 mg by mouth 2 (two) times daily with a meal.   Yes [provider]  OXYGEN Inhale 2 L into the lungs daily.   Yes [provider]  pantoprazole (PROTONIX) 40 MG tablet Take 40 mg by mouth daily.   Yes [provider]  torsemide (DEMADEX) 20 MG tablet Take 2 tablets (40 mg total) by mouth daily. 05/01/17  Yes Rai, Vernelle Emerald, MD  ACCU-CHEK SMARTVIEW test strip  02/06/18   [provider]  carvedilol (COREG) 6.25 MG tablet Take 1 tablet (6.25 mg total) by mouth  2 (two) times daily. 04/30/17 04/30/18  Rai, Vernelle Emerald, MD  Multiple Vitamins-Minerals (CENTRUM SILVER 50+MEN) TABS Take 1 tablet by mouth daily.    [provider]  omega-3 acid ethyl esters (LOVAZA) 1 g capsule Take 1 g by mouth daily.    [provider]  TRUEPLUS INSULIN SYRINGE 29G X 1/2" 1 ML MISC  02/16/18   [provider]  TRUEPLUS PEN NEEDLES 31G X 8 MM [place]  02/06/18   [provider]    Allergies as of 12/09/2021   (No Known Allergies)    Family History  Problem Relation Age of Onset   Hypertension Mother    Diabetes Mother    Stroke Father    Hypertension Father    Heart attack Father     Social History   Socioeconomic History   Marital status: Divorced    Spouse name: Not on file   Number of children: Not on file   Years of education: Not on file   Highest education level: Not on file  Occupational History    Employer: UNEMPLOYED  Tobacco Use   Smoking status: Never   Smokeless tobacco: Never  Vaping Use   Vaping Use: Never used  Substance and Sexual Activity   Alcohol use: No    Alcohol/week: 0.0 standard drinks of alcohol   Drug use: No   Sexual activity: Not on file  Other Topics Concern   Not on file  Social History Narrative   Not on file   Social Determinants of Health   Financial Resource Strain: Not on file  Food Insecurity: Not on file  Transportation Needs: Not on file  Physical Activity: Not on file  Stress: Not on file  Social Connections: Not on file  Intimate Partner Violence: Not on file    Review of Systems: See HPI, otherwise negative ROS  Physical Exam: BP 130/66   Pulse 78   Temp (!) 96.8 F (36 C) (Temporal)   Resp 18   Ht '6\' 2"'$  (1.88 m)   Wt (!) 167.4 kg   SpO2 93%   BMI 47.38 kg/m  General:   Alert,  pleasant and cooperative in NAD Head:  Normocephalic and atraumatic. Neck:  Supple; no masses or thyromegaly. Lungs:  Clear throughout to auscultation, normal respiratory effort.     Heart:  +S1, +S2, Regular rate and rhythm, No edema. Abdomen:  Soft, nontender and nondistended. Normal bowel sounds, without guarding, and without rebound.   Neurologic:  Alert and  oriented x4;  grossly normal neurologically.  Impression/Plan: Raymond [name] is here for an colonoscopy to be performed for surveillance due to prior history of colon polyps   Risks, benefits, limitations, and alternatives regarding  colonoscopy have been reviewed with the patient.  Questions have been answered.  All parties agreeable.   Jonathon Bellows, MD  12/27/2021, 9:06 AM

## 2021-12-28 ENCOUNTER — Encounter: Payer: Self-pay | Admitting: Gastroenterology

## 2021-12-28 LAB — SURGICAL PATHOLOGY

## 2022-01-03 ENCOUNTER — Other Ambulatory Visit: Payer: Self-pay | Admitting: Physician Assistant

## 2022-01-03 ENCOUNTER — Encounter: Payer: Medicare HMO | Attending: Physician Assistant | Admitting: Physician Assistant

## 2022-01-03 ENCOUNTER — Ambulatory Visit
Admission: RE | Admit: 2022-01-03 | Discharge: 2022-01-03 | Disposition: A | Discharge status: Home / Self Care | Payer: Medicare HMO | Source: Ambulatory Visit | Attending: Physician Assistant | Admitting: Physician Assistant

## 2022-01-03 ENCOUNTER — Other Ambulatory Visit
Admission: RE | Admit: 2022-01-03 | Discharge: 2022-01-03 | Disposition: A | Discharge status: Home / Self Care | Payer: Medicare HMO | Source: Ambulatory Visit | Attending: Physician Assistant | Admitting: Physician Assistant

## 2022-01-03 DIAGNOSIS — L97522 Non-pressure chronic ulcer of other part of left foot with fat layer exposed: Secondary | ICD-10-CM | POA: Diagnosis not present

## 2022-01-03 DIAGNOSIS — E11621 Type 2 diabetes mellitus with foot ulcer: Secondary | ICD-10-CM | POA: Diagnosis present

## 2022-01-03 DIAGNOSIS — I110 Hypertensive heart disease with heart failure: Secondary | ICD-10-CM | POA: Insufficient documentation

## 2022-01-03 DIAGNOSIS — E118 Type 2 diabetes mellitus with unspecified complications: Secondary | ICD-10-CM

## 2022-01-03 DIAGNOSIS — E1140 Type 2 diabetes mellitus with diabetic neuropathy, unspecified: Secondary | ICD-10-CM | POA: Diagnosis not present

## 2022-01-03 DIAGNOSIS — B999 Unspecified infectious disease: Secondary | ICD-10-CM | POA: Diagnosis present

## 2022-01-03 DIAGNOSIS — I5042 Chronic combined systolic (congestive) and diastolic (congestive) heart failure: Secondary | ICD-10-CM | POA: Diagnosis not present

## 2022-01-03 DIAGNOSIS — E1151 Type 2 diabetes mellitus with diabetic peripheral angiopathy without gangrene: Secondary | ICD-10-CM | POA: Insufficient documentation

## 2022-01-03 NOTE — Progress Notes (Signed)
HULBERT, BRANSCOME ([id number]) [id number] Page 1 of 12 Visit Report for 01/03/2022 Allergy List Details Patient Name: Date of Service: Raymond [name]. 01/03/2022 9:45 A M Medical Record Number: [id number] Patient Account Number: [id number] Date of Birth/Sex: Treating RN: 1965/02/15 (56 y.o. [name] Primary Care Gradie Butrick: [name] Other Clinician: Referring Hyder Deman: Treating Kaizen Ibsen/Extender: [name] Weeks in Treatment: 0 Allergies Active Allergies No Known Allergies Allergy Notes Electronic Signature(s) Signed: 01/03/2022 2:54:45 PM By: Gretta Cool, BSN, RN, CWS, Kim RN, BSN Entered By: Gretta Cool, BSN, RN, CWS, Kim on 01/03/2022 10:10:02 -------------------------------------------------------------------------------- Arrival Information Details Patient Name: Date of Service: Raymond Eke [name]. 01/03/2022 9:45 A M Medical Record Number: [id number] Patient Account Number: [id number] Date of Birth/Sex: Treating RN: 05-15-1965 (55 y.o. [name] Primary Care Jahleel Stroschein: [name] Other Clinician: Referring Dayani Winbush: Treating Malcolm Quast/Extender: Hazle Coca in Treatment: 0 Visit Information Patient Arrived: Ambulatory Arrival Time: 10:00 Accompanied By: self Transfer Assistance: None Patient Identification Verified: Yes Secondary Verification Process Completed: Yes Patient Has Alerts: Yes Patient Alerts: Type II Diabetic Aspirin '81mg'$  ABI L [place]>220 History Since Last Visit Electronic Signature(s) Signed: 01/03/2022 2:54:45 PM By: Gretta Cool, BSN, RN, CWS, Kim RN, BSN [place], [place] [name] ([id number]) [phone].pdf Page 2 of 12 Entered By: Gretta Cool, BSN, RN, CWS, Kim on 01/03/2022 10:26:51 -------------------------------------------------------------------------------- Clinic Level of Care Assessment Details Patient Name: Date of Service: Raymond [name]  01/03/2022 9:45 A M Medical Record Number: [id number] Patient Account Number: [id number] Date of Birth/Sex: Treating RN: 01-02-1966 (56 y.o. [name] Primary Care Zhion Pevehouse: [name] Other Clinician: Referring Briceson Broadwater: Treating Whyatt Klinger/Extender: Hazle Coca in Treatment: 0 Clinic Level of Care Assessment Items TOOL 1 Quantity Score '[]'$  - 0 Use when EandM and Procedure is performed on INITIAL visit ASSESSMENTS - Nursing Assessment / Reassessment X- 1 20 General Physical Exam (combine w/ comprehensive assessment (listed just below) when performed on new pt. evals) X- 1 25 Comprehensive Assessment (HX, ROS, Risk Assessments, Wounds Hx, etc.) ASSESSMENTS - Wound and Skin Assessment / Reassessment '[]'$  - 0 Dermatologic / Skin Assessment (not related to wound area) ASSESSMENTS - Ostomy and/or Continence Assessment and Care '[]'$  - 0 Incontinence Assessment and Management '[]'$  - 0 Ostomy Care Assessment and Management (repouching, etc.) PROCESS - Coordination of Care X - Simple Patient / Family Education for ongoing care 1 15 '[]'$  - 0 Complex (extensive) Patient / Family Education for ongoing care X- 1 10 Staff obtains Programmer, systems, Records, T Results / Process Orders est '[]'$  - 0 Staff telephones HHA, Nursing Homes / Clarify orders / etc '[]'$  - 0 Routine Transfer to another Facility (non-emergent condition) '[]'$  - 0 Routine Hospital Admission (non-emergent condition) X- 1 15 New Admissions / Biomedical engineer / Ordering NPWT Apligraf, etc. , '[]'$  - 0 Emergency Hospital Admission (emergent condition) PROCESS - Special Needs '[]'$  - 0 Pediatric / Minor Patient Management '[]'$  - 0 Isolation Patient Management '[]'$  - 0 Hearing / Language / Visual special needs '[]'$  - 0 Assessment of Community assistance (transportation, [name]/C planning, etc.) '[]'$  - 0 Additional assistance / Altered mentation '[]'$  - 0 Support Surface(s) Assessment (bed, cushion, seat,  etc.) INTERVENTIONS - Miscellaneous '[]'$  - 0 External ear exam '[]'$  - 0 Patient Transfer (multiple staff / Civil Service fast streamer / Similar devices) '[]'$  - 0 Simple Staple / Suture removal (25 or less) '[]'$  - 0 Complex Staple / Suture removal (26 or more) NIMAI, BURBACH ([id number]) [id number] Page 3 of  12 '[]'$  - 0 Hypo/Hyperglycemic Management (do not check if billed separately) X- 1 15 Ankle / Brachial Index (ABI) - do not check if billed separately Has the patient been seen at the hospital within the last three years: Yes Total Score: 100 Level Of Care: New/Established - Level 3 Electronic Signature(s) Signed: 01/03/2022 2:54:45 PM By: Gretta Cool, BSN, RN, CWS, Kim RN, BSN Entered By: Gretta Cool, BSN, RN, CWS, Kim on 01/03/2022 11:17:53 -------------------------------------------------------------------------------- Encounter Discharge Information Details Patient Name: Date of Service: Raymond [name], Raymond Negus [name]. 01/03/2022 9:45 A M Medical Record Number: [id number] Patient Account Number: [id number] Date of Birth/Sex: Treating RN: 1965/05/18 (56 y.o. [name] Primary Care Toria Monte: [name] Other Clinician: Referring Atari Novick: Treating Yon Schiffman/Extender: Hazle Coca in Treatment: 0 Encounter Discharge Information Items Post Procedure Vitals Discharge Condition: Stable Temperature (F): 98.0 Ambulatory Status: Ambulatory Pulse (bpm): 73 Discharge Destination: Home Respiratory Rate (breaths/min): 16 Transportation: Private Auto Blood Pressure (mmHg): 127/75 Accompanied By: self Schedule Follow-up Appointment: No Clinical Summary of Care: Electronic Signature(s) Signed: 01/03/2022 2:02:10 PM By: Gretta Cool, BSN, RN, CWS, Kim RN, BSN Entered By: Gretta Cool, BSN, RN, CWS, Kim on 01/03/2022 14:02:10 -------------------------------------------------------------------------------- Lower Extremity Assessment Details Patient Name: Date of  Service: Raymond [name], Raymond Negus [name]. 01/03/2022 9:45 A M Medical Record Number: [id number] Patient Account Number: [id number] Date of Birth/Sex: Treating RN: 07/07/1965 (56 y.o. [name] Primary Care Li Fragoso: [name] Other Clinician: Referring Brayleigh Rybacki: Treating Rivan Siordia/Extender: [name] Weeks in Treatment: 0 Edema Assessment Assessed: Shirlyn Goltz: No] Patrice Paradise: No] [Left: Edema] [Right: :] C[LeftClearance Coots ([id number])] [Right: [id number] Page 4 of 12] Calf Left: Right: Point of Measurement: 36 cm From Medial Instep 49.6 cm Ankle Left: Right: Point of Measurement: 11 cm From Medial Instep 28.2 cm Knee To Floor Left: Right: From Medial Instep 48 cm Vascular Assessment Pulses: Dorsalis Pedis Palpable: [Left:No] Doppler Audible: [Left:Yes] Posterior Tibial Palpable: [Left:No Yes] Notes Patient is [place] >220 Electronic Signature(s) Signed: 01/03/2022 2:54:45 PM By: Gretta Cool, BSN, RN, CWS, Kim RN, BSN Entered By: Gretta Cool, BSN, RN, CWS, Kim on 01/03/2022 10:26:16 -------------------------------------------------------------------------------- Multi Wound Chart Details Patient Name: Date of Service: Raymond Eke [name]. 01/03/2022 9:45 A M Medical Record Number: [id number] Patient Account Number: [id number] Date of Birth/Sex: Treating RN: September 28, 1965 (56 y.o. [name] Primary Care Majd Tissue: [name] Other Clinician: Referring Nolberto Cheuvront: Treating Leelah Hanna/Extender: [name] Weeks in Treatment: 0 Vital Signs Height(in): 74 Pulse(bpm): 73 Weight(lbs): 371 Blood Pressure(mmHg): 127/75 Body Mass Index(BMI): 47.6 Temperature(F): 98.0 Respiratory Rate(breaths/min): 16 [2:Photos:] [N/A:N/A] Left, Medial Metatarsal head first Left T Third oe N/A Wound Location: Gradually Appeared Footwear Injury N/A Wounding Event: Raymond [name], Raymond [name] ([id number]) [id number]  Page 5 of 12 Diabetic Wound/Ulcer of the Lower Diabetic Wound/Ulcer of the Lower N/A Primary Etiology: Extremity Extremity Congestive Heart Failure, Congestive Heart Failure, N/A Comorbid History: Hypertension, Type II Diabetes, Hypertension, Type II Diabetes, Neuropathy Neuropathy 12/13/2021 01/03/2022 N/A Date Acquired: 0 0 N/A Weeks of Treatment: Open Open N/A Wound Status: No No N/A Wound Recurrence: 1.8x0.9x0.3 0.2x0.5x0.1 N/A Measurements L x W x [name] (cm) 1.272 0.079 N/A A (cm) : rea 0.382 0.008 N/A Volume (cm) : 7 Starting Position 1 (o'clock): 11 Ending Position 1 (o'clock): 0.9 Maximum Distance 1 (cm): Yes No N/A Undermining: Grade 1 Grade 1 N/A Classification: Medium Medium N/A Exudate A mount: Serous Serous N/A Exudate Type: amber amber N/A Exudate Color: Flat and Intact Flat and Intact N/A Wound Margin: None Present (0%) None  Present (0%) N/A Granulation A mount: Large (67-100%) Large (67-100%) N/A Necrotic A mount: Fat Layer (Subcutaneous Tissue): Yes Fat Layer (Subcutaneous Tissue): Yes N/A Exposed Structures: Fascia: No Fascia: No Tendon: No Tendon: No Muscle: No Muscle: No Joint: No Joint: No Bone: No Bone: No None None N/A Epithelialization: Callus around wound . N/A N/A Assessment Notes: Treatment Notes Electronic Signature(s) Signed: 01/03/2022 2:54:45 PM By: Gretta Cool, BSN, RN, CWS, Kim RN, BSN Entered By: Gretta Cool, BSN, RN, CWS, Kim on 01/03/2022 10:45:08 -------------------------------------------------------------------------------- Multi-Disciplinary Care Plan Details Patient Name: Date of Service: Raymond [name], Raymond Negus [name]. 01/03/2022 9:45 A M Medical Record Number: [id number] Patient Account Number: [id number] Date of Birth/Sex: Treating RN: 1965/10/19 (56 y.o. [name] Primary Care Ahaana Rochette: [name] Other Clinician: Referring Uyen Eichholz: Treating Latrell Potempa/Extender: Hazle Coca in Treatment:  0 Active Inactive Abuse / Safety / Falls / Self Care Management Nursing Diagnoses: Potential for falls Goals: Patient will remain injury free related to falls Date Initiated: 01/03/2022 Target Resolution Date: 01/03/2022 Goal Status: Active Interventions: Provide education on fall prevention Notes: KODEY, XUE ([id number]) [id number] Page 6 of 12 Medication Nursing Diagnoses: Knowledge deficit related to medication safety: actual or potential Goals: Patient/caregiver will demonstrate understanding of all current medications Date Initiated: 01/03/2022 T arget Resolution Date: 01/03/2022 Goal Status: Active Interventions: Assess for medication contraindications each visit where new medications are prescribed Assess patient/caregiver ability to manage medication regimen upon admission and as needed Patient/Caregiver given reconciled medication list upon admission, changes in medications and discharge from the [facility] Provide education on medication safety Notes: Necrotic Tissue Nursing Diagnoses: Impaired tissue integrity related to necrotic/devitalized tissue Knowledge deficit related to management of necrotic/devitalized tissue Goals: Necrotic/devitalized tissue will be minimized in the wound bed Date Initiated: 01/03/2022 Target Resolution Date: 01/03/2022 Goal Status: Active Patient/caregiver will verbalize understanding of reason and process for debridement of necrotic tissue Date Initiated: 01/03/2022 Target Resolution Date: 01/03/2022 Goal Status: Active Interventions: Assess patient pain level pre-, during and post procedure and prior to discharge Provide education on necrotic tissue and debridement process Treatment Activities: Excisional debridement : 01/03/2022 Notes: Nutrition Nursing Diagnoses: Impaired glucose control: actual or potential Goals: Patient/caregiver verbalizes understanding of need to maintain therapeutic  glucose control per primary care physician Date Initiated: 01/03/2022 Target Resolution Date: 01/03/2022 Goal Status: Active Interventions: Provide education on elevated blood sugars and impact on wound healing Notes: Orientation to the Wound Care Program Nursing Diagnoses: Knowledge deficit related to the wound healing center program Goals: Patient/caregiver will verbalize understanding of the [place] [facility] Date Initiated: 01/03/2022 Target Resolution Date: 01/03/2022 Goal Status: Active Interventions: Provide education on orientation to the wound center Notes: Peripheral Neuropathy Nursing Diagnoses: Knowledge deficit related to disease process and management of peripheral neurovascular dysfunction Potential alteration in peripheral tissue perfusion (select prior to confirmation of diagnosis) Goals: Raymond [name], Raymond [name] ([id number]) [id number] Page 7 of 12 Patient/caregiver will verbalize understanding of disease process and disease management Date Initiated: 01/03/2022 Target Resolution Date: 01/03/2022 Goal Status: Active Interventions: Assess signs and symptoms of neuropathy upon admission and as needed Provide education on Management of Neuropathy and Related Ulcers Notes: Pressure Nursing Diagnoses: Knowledge deficit related to causes and risk factors for pressure ulcer development Knowledge deficit related to management of pressures ulcers Potential for impaired tissue integrity related to pressure, friction, moisture, and shear Goals: Patient will remain free from development of additional pressure ulcers Date Initiated: 01/03/2022 Target Resolution Date: 01/03/2022 Goal Status: Active Patient/caregiver will verbalize risk factors  for pressure ulcer development Date Initiated: 01/03/2022 Target Resolution Date: 01/03/2022 Goal Status: Active Patient/caregiver will verbalize understanding of pressure ulcer management Date  Initiated: 01/03/2022 Target Resolution Date: 01/03/2022 Goal Status: Active Interventions: Assess potential for pressure ulcer upon admission and as needed Notes: Soft Tissue Infection Nursing Diagnoses: Impaired tissue integrity Knowledge deficit related to disease process and management Knowledge deficit related to home infection control: handwashing, handling of soiled dressings, supply storage Potential for infection: soft tissue Goals: Patient will remain free of wound infection Date Initiated: 01/03/2022 Target Resolution Date: 01/03/2022 Goal Status: Active Patient/caregiver will verbalize understanding of or measures to prevent infection and contamination in the home setting Date Initiated: 01/03/2022 Target Resolution Date: 01/03/2022 Goal Status: Active Patient's soft tissue infection will resolve Date Initiated: 01/03/2022 Target Resolution Date: 01/03/2022 Goal Status: Active Signs and symptoms of infection will be recognized early to allow for prompt treatment Date Initiated: 01/03/2022 Target Resolution Date: 01/03/2022 Goal Status: Active Interventions: Assess signs and symptoms of infection every visit Treatment Activities: Culture and sensitivity : 01/03/2022 Notes: Wound/Skin Impairment Nursing Diagnoses: Impaired tissue integrity Knowledge deficit related to smoking impact on wound healing Knowledge deficit related to ulceration/compromised skin integrity Goals: Patient/caregiver will verbalize understanding of skin care regimen Date Initiated: 01/03/2022 Target Resolution Date: 01/03/2022 Goal Status: Active Ulcer/skin breakdown will have a volume reduction of 30% by week 4 Date Initiated: 01/03/2022 Target Resolution Date: 01/31/2022 Goal Status: Active Raymond [name], Raymond [name] ([id number]) [id number] Page 8 of 12 Interventions: Assess patient/caregiver ability to obtain necessary supplies Assess patient/caregiver ability to perform  ulcer/skin care regimen upon admission and as needed Assess ulceration(s) every visit Notes: Electronic Signature(s) Signed: 01/03/2022 1:50:28 PM By: Gretta Cool, BSN, RN, CWS, Kim RN, BSN Entered By: Gretta Cool, BSN, RN, CWS, Kim on 01/03/2022 13:50:28 -------------------------------------------------------------------------------- Pain Assessment Details Patient Name: Date of Service: Raymond Eke [name]. 01/03/2022 9:45 A M Medical Record Number: [id number] Patient Account Number: [id number] Date of Birth/Sex: Treating RN: [birth year] (56 y.o. [name] Primary Care Venessa Wickham: [name] Other Clinician: Referring Atalia Litzinger: Treating Jamilah Jean/Extender: [name] Weeks in Treatment: 0 Active Problems Location of Pain Severity and Description of Pain Patient Has Paino No Site Locations Pain Management and Medication Current Pain Management: Notes Patient states "no feeling". Electronic Signature(s) Signed: 01/03/2022 2:54:45 PM By: Gretta Cool, BSN, RN, CWS, Kim RN, BSN Entered By: Gretta Cool, BSN, RN, CWS, Kim on 01/03/2022 10:04:14 Raymond [name] ([id number]) [id number] Page 9 of 12 -------------------------------------------------------------------------------- Patient/Caregiver Education Details Patient Name: Date of Service: Raymond [name] 12/4/2023andnbsp9:45 A M Medical Record Number: [id number] Patient Account Number: [id number] Date of Birth/Gender: Treating RN: [birth year] (56 y.o. [name] Primary Care Physician: [name] Other Clinician: Referring Physician: Treating Physician/Extender: Hazle Coca in Treatment: 0 Education Assessment Education Provided To: Patient Education Topics Provided Pressure: Handouts: Pressure Ulcers: Care and Offloading, Other: wear off-loading shoe Methods: Demonstration, Explain/Verbal Responses: State content correctly Wound  Debridement: Handouts: Wound Debridement Methods: Explain/Verbal Responses: State content correctly Electronic Signature(s) Signed: 01/03/2022 2:54:45 PM By: Gretta Cool, BSN, RN, CWS, Kim RN, BSN Entered By: Gretta Cool, BSN, RN, CWS, Kim on 01/03/2022 11:18:32 -------------------------------------------------------------------------------- Wound Assessment Details Patient Name: Date of Service: Raymond Eke [name]. 01/03/2022 9:45 A M Medical Record Number: [id number] Patient Account Number: [id number] Date of Birth/Sex: Treating RN: 10/19/1965 (56 y.o. [name] Primary Care Delayna Sparlin: [name] Other Clinician: Referring Cloris Flippo: Treating Jemima Petko/Extender: [name] Weeks in Treatment: 0 Wound Status Wound Number:  2 Primary Diabetic Wound/Ulcer of the Lower Extremity Etiology: Wound Location: Left, Medial Metatarsal head first Wound Status: Open Wounding Event: Gradually Appeared Comorbid Congestive Heart Failure, Hypertension, Type II Diabetes, Date Acquired: 12/13/2021 History: Neuropathy Weeks Of Treatment: 0 Clustered Wound: No Raymond [name], Raymond [name] ([id number]) [id number] Page 10 of 12 Photos Wound Measurements Length: (cm) 1.8 Width: (cm) 0.9 Depth: (cm) 0.3 Area: (cm) 1.272 Volume: (cm) 0.382 % Reduction in Area: % Reduction in Volume: Epithelialization: None Tunneling: No Undermining: Yes Starting Position (o'clock): 7 Ending Position (o'clock): 11 Maximum Distance: (cm) 0.9 Wound Description Classification: Grade 2 Wound Margin: Flat and Intact Exudate Amount: Medium Exudate Type: Serous Exudate Color: amber Foul Odor After Cleansing: No Slough/Fibrino Yes Wound Bed Granulation Amount: None Present (0%) Exposed Structure Necrotic Amount: Large (67-100%) Fascia Exposed: No Necrotic Quality: Adherent Slough Fat Layer (Subcutaneous Tissue) Exposed: Yes Tendon Exposed: No Muscle Exposed: No Joint  Exposed: No Bone Exposed: No Assessment Notes Callus around wound . Treatment Notes Wound #2 (Metatarsal head first) Wound Laterality: Left, Medial Cleanser Soap and Water Discharge Instruction: Gently cleanse wound with antibacterial soap, rinse and pat dry prior to dressing wounds Peri-Wound Care Topical Primary Dressing Iodosorb 40 (g) Discharge Instruction: Apply IodoSorb to wound bed only as directed. Secondary Dressing Gauze Discharge Instruction: As directed: dry, moistened with saline or moistened with Dakins Solution Secured With [place] H Soft Cloth Surgical T ape ape, 2x2 (in/yd) Compression Wrap Compression Stockings Environmental education officer) Signed: 01/03/2022 1:59:43 PM By: Gretta Cool, BSN, RN, CWS, Kim RN, BSN Raymond [name], Raymond [name] ([id number]) [phone].pdf Page 11 of 12 Entered By: Gretta Cool, BSN, RN, CWS, Kim on 01/03/2022 13:59:43 -------------------------------------------------------------------------------- Wound Assessment Details Patient Name: Date of Service: Raymond [name] 01/03/2022 9:45 A M Medical Record Number: [id number] Patient Account Number: [id number] Date of Birth/Sex: Treating RN: 1965/07/09 (56 y.o. [name] Primary Care Ram Haugan: [name] Other Clinician: Referring Jelissa Espiritu: Treating Dakari Stabler/Extender: [name] Weeks in Treatment: 0 Wound Status Wound Number: 3 Primary Diabetic Wound/Ulcer of the Lower Extremity Etiology: Wound Location: Left T Third oe Wound Status: Open Wounding Event: Footwear Injury Comorbid Congestive Heart Failure, Hypertension, Type II Diabetes, Date Acquired: 01/03/2022 History: Neuropathy Weeks Of Treatment: 0 Clustered Wound: No Photos Wound Measurements Length: (cm) 0.9 Width: (cm) 0.9 Depth: (cm) 0.2 Area: (cm) 0.636 Volume: (cm) 0.127 % Reduction in Area: % Reduction in Volume: Epithelialization: None Tunneling:  No Undermining: No Wound Description Classification: Grade 1 Wound Margin: Flat and Intact Exudate Amount: Medium Exudate Type: Serous Exudate Color: amber Foul Odor After Cleansing: No Slough/Fibrino Yes Wound Bed Granulation Amount: None Present (0%) Exposed Structure Necrotic Amount: Large (67-100%) Fascia Exposed: No Fat Layer (Subcutaneous Tissue) Exposed: Yes Tendon Exposed: No Muscle Exposed: No Joint Exposed: No Bone Exposed: No Treatment Notes Wound #3 (Toe Third) Wound Laterality: Left Raymond [name], Raymond [name] ([id number]) [id number] Page 12 of 12 Cleanser Soap and Water Discharge Instruction: Gently cleanse wound with antibacterial soap, rinse and pat dry prior to dressing wounds Peri-Wound Care Topical Primary Dressing Iodosorb 40 (g) Discharge Instruction: Apply IodoSorb to wound bed only as directed. Secondary Dressing Gauze Discharge Instruction: As directed: dry, moistened with saline or moistened with Dakins Solution Secured With Med[employer] T - 9M Medipore H Soft Cloth Surgical T ape ape, 2x2 (in/yd) Compression Wrap Compression Stockings Environmental education officer) Signed: 01/03/2022 2:54:45 PM By: Gretta Cool, BSN, RN, CWS, Kim RN, BSN Entered By: Gretta Cool, BSN, RN, CWS, Kim on 01/03/2022 11:00:37 -------------------------------------------------------------------------------- Raymond [name]  Details Patient Name: Date of Service: Raymond [name] 01/03/2022 9:45 A M Medical Record Number: [id number] Patient Account Number: [id number] Date of Birth/Sex: Treating RN: 21-Oct-1965 (56 y.o. Raymond [name], Raymond [name] Primary Care Ruthanne Mcneish: [name] Other Clinician: Referring Chanique Duca: Treating Maley Venezia/Extender: [name] Weeks in Treatment: 0 Vital Signs Time Taken: 10:04 Temperature (F): 98.0 Height (in): 74 Pulse (bpm): 73 Weight (lbs): 371 Respiratory Rate (breaths/min): 16 Body Mass Index (BMI): 47.6 Blood  Pressure (mmHg): 127/75 Reference Range: 80 - 120 mg / dl Electronic Signature(s) Signed: 01/03/2022 2:54:45 PM By: Gretta Cool, BSN, RN, CWS, Kim RN, BSN Entered By: Gretta Cool, BSN, RN, CWS, Kim on 01/03/2022 10:04:46

## 2022-01-03 NOTE — Progress Notes (Signed)
ARAD, BURSTON ([id number]) [id number] Page 1 of 12 Visit Report for 01/03/2022 Chief Complaint Document Details Patient Name: Date of Service: Raymond [name]. 01/03/2022 9:45 A M Medical Record Number: [id number] Patient Account Number: [id number] Date of Birth/Sex: Treating RN: August 25, 1965 (56 y.o. Male) Cornell Barman Primary Care Provider: Beverlyn Roux Other Clinician: Referring Provider: Treating Provider/Extender: Lockie Pares Weeks in Treatment: 0 Information Obtained from: Patient Chief Complaint Left foot ulcer Electronic Signature(s) Signed: 01/03/2022 10:36:05 AM By: [name] PA-C Entered By: [name] on 01/03/2022 10:36:05 -------------------------------------------------------------------------------- Debridement Details Patient Name: Date of Service: Raymond Eke D. 01/03/2022 9:45 A M Medical Record Number: [id number] Patient Account Number: [id number] Date of Birth/Sex: Treating RN: 31-Jul-1965 (56 y.o. Male) Cornell Barman Primary Care Provider: Beverlyn Roux Other Clinician: Referring Provider: Treating Provider/Extender: Lockie Pares Weeks in Treatment: 0 Debridement Performed for Assessment: Wound #2 Left,Medial Metatarsal head first Performed By: Physician Tommie Sams., PA-C Debridement Type: Debridement Severity of Tissue Pre Debridement: Fat layer exposed Level of Consciousness (Pre-procedure): Awake and Alert Pre-procedure Verification/Time Out Yes - 10:44 Taken: T Area Debrided (L x W): otal 1.8 (cm) x 0.9 (cm) = 1.62 (cm) Tissue and other material debrided: Viable, Non-Viable, Callus, Slough, Subcutaneous, Slough Level: Skin/Subcutaneous Tissue Debridement Description: Excisional Instrument: Curette Specimen: Swab, Number of Specimens T aken: 1 Bleeding: Minimum Hemostasis Achieved: Pressure Response to Treatment: Procedure was tolerated well Level of Consciousness  (Post- Awake and Alert procedure): Raymond [name], Raymond [name] ([id number]) [id number] Page 2 of 12 Post Debridement Measurements of Total Wound Length: (cm) 1.8 Width: (cm) 1 Depth: (cm) 0.4 Volume: (cm) 0.565 Character of Wound/Ulcer Post Debridement: Stable Severity of Tissue Post Debridement: Fat layer exposed Post Procedure Diagnosis Same as Pre-procedure Electronic Signature(s) Signed: 01/03/2022 2:54:45 PM By: Gretta Cool, BSN, RN, CWS, Kim RN, BSN Signed: 01/03/2022 4:48:10 PM By: [name] PA-C Entered By: Gretta Cool, BSN, RN, CWS, Kim on 01/03/2022 10:49:42 -------------------------------------------------------------------------------- Debridement Details Patient Name: Date of Service: Raymond Eke D. 01/03/2022 9:45 A M Medical Record Number: [id number] Patient Account Number: [id number] Date of Birth/Sex: Treating RN: 08/08/1965 (56 y.o. Male) Cornell Barman Primary Care Provider: Beverlyn Roux Other Clinician: Referring Provider: Treating Provider/Extender: Lockie Pares Weeks in Treatment: 0 Debridement Performed for Assessment: Wound #3 Left T Third oe Performed By: Physician Tommie Sams., PA-C Debridement Type: Debridement Severity of Tissue Pre Debridement: Fat layer exposed Level of Consciousness (Pre-procedure): Awake and Alert Pre-procedure Verification/Time Out Yes - 10:44 Taken: T Area Debrided (L x W): otal 0.5 (cm) x 0.5 (cm) = 0.25 (cm) Tissue and other material debrided: Viable, Non-Viable, Callus, Slough, Subcutaneous, Slough Level: Skin/Subcutaneous Tissue Debridement Description: Excisional Instrument: Curette Specimen: Swab, Number of Specimens T aken: 1 Bleeding: Minimum Hemostasis Achieved: Pressure Response to Treatment: Procedure was tolerated well Level of Consciousness (Post- Awake and Alert procedure): Post Debridement Measurements of Total Wound Length: (cm) 0.5 Width: (cm) 0.5 Depth: (cm)  0.2 Volume: (cm) 0.039 Character of Wound/Ulcer Post Debridement: Stable Severity of Tissue Post Debridement: Limited to breakdown of skin Post Procedure Diagnosis Same as Pre-procedure Electronic Signature(s) Signed: 01/03/2022 2:54:45 PM By: Gretta Cool, BSN, RN, CWS, Kim RN, BSN Signed: 01/03/2022 4:48:10 PM By: Caro Hight ([id number]) [phone].pdf Page 3 of 12 Entered By: Gretta Cool, BSN, RN, CWS, Kim on 01/03/2022 10:50:33 -------------------------------------------------------------------------------- HPI Details Patient Name: Date of Service: Raymond [name] 01/03/2022 9:45 A M Medical Record Number: [id number]  Patient Account Number: [id number] Date of Birth/Sex: Treating RN: 1965-09-19 (56 y.o. Male) Cornell Barman Primary Care Provider: Beverlyn Roux Other Clinician: Referring Provider: Treating Provider/Extender: Lockie Pares Weeks in Treatment: 0 History of Present Illness HPI Description: 09/05/17-He is seeing an initial evaluation for a left plantar foot ulcer. He has a remote history of left great toe amputation. He states that 4-6 weeks ago he noted callus formation and ulceration. He has not seen primary care regarding this. He is not currently on antibiotic therapy. He does not routinely follow with podiatry. He states diabetic foot wear will arrive early next week. The EHR shows an A1c of 9% approximate 4 months ago but he states he had one and primary care a few weeks ago but does not know the results. He is neuropathic and does not complain of any pain, he is currently wearing crocs. 09/12/17-he is here in follow up evaluation for left plantar foot ulcer. There is improvement in both appearance and measurement. We will continue with same treatment plan and he will follow up next week. 09/19/17 on evaluation today patient actually appears to be doing excellent in regard to his ulcer on the invitation site of  his left great foot plantar aspect. He's been tolerating the dressing changes without complication. In fact with the Prisma and the current measures he has been shown signs of excellent improvement week by week up to this point. We have been to breeding the wound in this seems to have been of great benefit for him. Fortunately there is no evidence of infection. 09/26/17 on evaluation today patient appears to be doing rather well in regard to his wound. He did not know quite as much improvement this week as compared to last week. Nonetheless he still continues to show signs of improving to some degree. I do believe he may benefit from an offloading shoe. No fevers, chills, nausea, or vomiting noted at this time. 10/03/17 on evaluation today patient actually appears to be doing much better in regard to the amputation site plantar foot ulcer. Overall this appears significantly smaller even compared to previous. He's been tolerating the dressing changes without complication. He did get his diabetic shoes and I did have a look at them they appear to be fairly good. Nonetheless I do think that for the time being I would probably recommend he continue with the offloading shoe that we have been utilizing just due to the fact that with the dressing I don't know that his diabetic shoes are going to work as appropriately as far as not called an additional pressure and irritation to the area in question. He understands. 10/10/17 on evaluation today patient actually appears to be doing very well in regard to his plantar foot ulcer. He has been tolerating the dressing changes without complication. I do feel like he's making signs of good improvement and in fact of the wound bed appears to be better although it may not be significantly changed in size it appears healthier and I do believe is showing signs of improving. Nonetheless we gonna keep working towards healing as far as that is concerned. There is no evidence of  infection. 10/17/17 on evaluation today patient actually appears to be doing poorly in regard to his plantar foot ulcer. Unfortunately other than just the area where the wound is itself there appears to be a blister that communicates with the wound unfortunately. This is more lateral to the wound itself and also to the distal point of  the amputation site. There does not appear to be any evidence of cellulitis spreading at the foot but I do believe some of the drainage from the site itself is. When in nature. Nonetheless this blister area I think needs to be removed in order to allow for appropriate healing of the ulcer itself I think that is gonna be difficult for it to heal otherwise. This was discussed with the patient today. 10/24/17 on evaluation today patient actually appears to be doing better compared to last week even post debridement. He has shown signs of improvement he did test positive for Staphylococcus aureus. With that being said he notes that he's not have any discomfort and in general he does feel like things seem to been doing better. Fortunately there's no additional blistering and no evidence of remaining infection at this time. No fevers, chills, nausea, or vomiting noted at this time. He has three days of antibiotic left. 10/31/17 on evaluation today patient actually appears to be doing very well in regard to his ulcer on the left foot. He has been tolerating the dressing changes without complication. With that being said fortunately there appears to be no evidence of infection I do think he's made progress compared to previous. No fevers, chills, nausea, or vomiting noted at this time. 11/14/17 on evaluation today patient actually appears to be doing excellent in regard to his amputation site ulcer on his foot. In fact this appears to be completely healed at this point. There is no evidence of infection nor underline abscess and I did thoroughly evaluate the periwound location.  Overall I'm very pleased with how things appear. Readmission: 01-03-2022 upon evaluation today patient appears for reevaluation here in the clinic although it has been since 2019 that I last saw him. This again has been over 4 years. With that being said he is having an issue with a wound on his foot unfortunately. He has not had any recent x-rays he tells me at this point which is unfortunate as avoid definitely can need to get something started that can be one of the primary items to get moving forward with. With that being said I also think were probably can obtain a wound culture he is probably going to require some antibiotics at some point. I just want to make sure that we have him on the right thing when we do this. Currently the patient tells me that his wounds have been present in regard to the met head for about a month at this point. With that being said he also has a wound on the third toe which is the next toe this actually still present. This unfortunately has a lot of callus buildup around it but I think it is larger underneath than what it would appear on initial inspection. Patient does have a history of diabetes mellitus type 2, congestive heart failure, and COPD. Raymond [name], Raymond [name] ([id number]) [id number] Page 4 of 12 Electronic Signature(s) Signed: 01/03/2022 4:43:24 PM By: [name] PA-C Entered By: [name] on 01/03/2022 16:43:24 -------------------------------------------------------------------------------- Physical Exam Details Patient Name: Date of Service: Raymond Eke D. 01/03/2022 9:45 A M Medical Record Number: [id number] Patient Account Number: [id number] Date of Birth/Sex: Treating RN: Dec 07, 1965 (56 y.o. Male) Cornell Barman Primary Care Provider: Beverlyn Roux Other Clinician: Referring Provider: Treating Provider/Extender: Lockie Pares Weeks in Treatment: 0 Constitutional sitting or standing blood  pressure is within target range for patient.. pulse regular and within target range for patient.Marland Kitchen respirations  regular, non-labored and within target range for patient.Marland Kitchen temperature within target range for patient.. Well-nourished and well-hydrated in no acute distress. Eyes conjunctiva clear no eyelid edema noted. pupils equal round and reactive to light and accommodation. Ears, Nose, Mouth, and Throat no gross abnormality of ear auricles or external auditory canals. normal hearing noted during conversation. mucus membranes moist. Respiratory normal breathing without difficulty. Cardiovascular 1+ dorsalis pedis/posterior tibialis pulses. 1+ pitting edema of the bilateral lower extremities. Musculoskeletal normal gait and posture. no significant deformity or arthritic changes, no loss or range of motion, no clubbing. Psychiatric this patient is able to make decisions and demonstrates good insight into disease process. Alert and Oriented x 3. pleasant and cooperative. Notes Upon inspection patient's wound bed actually showed signs of necrotic tissue noted at both locations as well as some callus buildup I did perform debridement at both sites this is the third toe as well as the met head and at both locations I was able to clearway the necrotic debris here today without complication. Postdebridement the wound bed appeared to be doing much better. Nonetheless I do think we need to see about sending him for an x-ray to evaluate the foot and see if there is any evidence initially of osteomyelitis potentially present. Electronic Signature(s) Signed: 01/03/2022 4:44:30 PM By: [name] PA-C Entered By: [name] on 01/03/2022 16:44:29 Physician Orders Details -------------------------------------------------------------------------------- Raymond [name] ([id number]) [id number] Page 5 of 12 Patient Name: Date of Service: Raymond [name]  01/03/2022 9:45 A M Medical Record Number: [id number] Patient Account Number: [id number] Date of Birth/Sex: Treating RN: April 27, 1965 (56 y.o. 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 Wound #3 Left T Third oe Return Appointment in 1 week. Bathing/ [employer] wounds with antibacterial soap and water. May shower; gently cleanse wound with antibacterial soap, rinse and pat dry prior to dressing wounds Off-Loading Open toe surgical shoe Wound Treatment Wound #2 - Metatarsal head first Wound Laterality: Left, Medial Cleanser: Soap and Water 3 x Per Week/30 Days Discharge Instructions: Gently cleanse wound with antibacterial soap, rinse and pat dry prior to dressing wounds Prim Dressing: Iodosorb 40 (g) 3 x Per Week/30 Days ary Discharge Instructions: Apply IodoSorb to wound bed only as directed. Secondary Dressing: Gauze 3 x Per Week/30 Days Discharge Instructions: As directed: dry, moistened with saline or moistened with Dakins Solution Secured With: Medipore T - 952M Medipore H Soft Cloth Surgical T ape ape, 2x2 (in/yd) 3 x Per Week/30 Days Wound #3 - T Third oe Wound Laterality: Left Cleanser: Soap and Water 3 x Per Week/30 Days Discharge Instructions: Gently cleanse wound with antibacterial soap, rinse and pat dry prior to dressing wounds Prim Dressing: Iodosorb 40 (g) 3 x Per Week/30 Days ary Discharge  Instructions: Apply IodoSorb to wound bed only as directed. Secondary Dressing: Gauze 3 x Per Week/30 Days Discharge Instructions: As directed: dry, moistened with saline or moistened with Dakins Solution Secured With: Medipore T - 952M Medipore H Soft Cloth Surgical T ape ape, 2x2 (in/yd) 3  x Per Week/30 Days Laboratory Bacteria identified in Wound by Culture (MICRO) - Left 5th met-head LOINC Code: 8182-9 Convenience Name: Wound culture routine Radiology X-ray, foot - Left foot three-view. Diabetic with non-healing wound on Left 1st met head and Left 3rd toe. 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 Iodosorb dressing applied to both wounds. Services and Therapies nkle Brachial Index (ABI)-Bilateral - Left non-healing wounds on left 1st met head and 3rd toe. 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 A Electronic Signature(s) Raymond [name], Raymond [name] ([id number]) [id number] Page 6 of 12 Signed: 01/03/2022 2:54:45 PM By: Gretta Cool, BSN, RN, CWS, Kim RN, BSN Signed: 01/03/2022 4:48:10 PM By: [name] PA-C Entered By: Gretta Cool BSN, RN, CWS, Kim on 01/03/2022 10:58:37 Prescription 01/03/2022 -------------------------------------------------------------------------------- Raymond [name]. [name] PA-C Patient Name: Provider: 07-16-1965 [id number] Date of Birth: NPI#: Male [id number] Sex: DEA #: [phone] Phone #: License #: [place] Patient Address: [facility], [address] [address] [name] [place], [place] [address] [phone] Allergies No Known Allergies Provider's Orders X-ray, foot - Left foot three-view. Diabetic with non-healing wound on Left 1st met head and Left 3rd toe. 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 Iodosorb dressing applied to both wounds. Hand Signature: Date(s): Electronic Signature(s) Signed: 01/03/2022 2:54:45 PM By:  Gretta Cool, BSN, RN, CWS, Kim RN, BSN Signed: 01/03/2022 4:48:10 PM By: [name] PA-C Entered By: Gretta Cool BSN, RN, CWS, Kim on 01/03/2022 10:58:37 -------------------------------------------------------------------------------- Problem List Details Patient Name: Date of Service: Raymond Eke D. 01/03/2022 9:45 A M Medical Record Number: [id number] Patient Account Number: [id number] Date of Birth/Sex: Treating RN: 23-Jan-1966 (56 y.o. Male) Cornell Barman Primary Care Provider: Beverlyn Roux Other Clinician: Referring Provider: Treating Provider/Extender: Cathie Olden in Treatment: 0 Active Problems ICD-10 Encounter Code Description Active Date MDM RHYKER, SILVERSMITH ([id number]) [id number] Page 7 of 12 Code Description Active Date MDM Diagnosis E11.621 Type 2 diabetes mellitus with foot ulcer 01/03/2022 No Yes L97.522 Non-pressure chronic ulcer of other part of left foot with fat layer exposed 01/03/2022 No Yes I50.42 Chronic combined systolic (congestive) and diastolic (congestive) heart failure 01/03/2022 No Yes J44.9 Chronic obstructive pulmonary disease, unspecified 01/03/2022 No Yes Inactive Problems Resolved Problems Electronic Signature(s) Signed: 01/03/2022 10:35:05 AM By: [name] PA-C Entered By: [name] on 01/03/2022 10:35:05 -------------------------------------------------------------------------------- Progress Note Details Patient Name: Date of Service: Raymond Eke D. 01/03/2022 9:45 A M Medical Record Number: [id number] Patient Account Number: [id number] Date of Birth/Sex: Treating RN: 1965/03/10 (56 y.o. Male) Cornell Barman Primary Care Provider: Beverlyn Roux Other Clinician: Referring Provider: Treating Provider/Extender: Lockie Pares Weeks in Treatment: 0 Subjective Chief Complaint Information obtained from Patient Left foot ulcer History of Present Illness (HPI) 09/05/17-He  is seeing an initial evaluation for a left plantar foot ulcer. He has a remote history of left great toe amputation. He states that 4-6 weeks ago he noted callus formation and ulceration. He has not seen primary care regarding this. He is not currently on antibiotic therapy. He does not routinely follow with podiatry. He states diabetic foot wear will arrive early next week. The EHR shows an A1c of 9% approximate 4 months ago but he states he had one and primary care a few weeks ago but does not know the results. He is neuropathic and does not complain of any pain, he is currently wearing crocs. 09/12/17-he is here in follow  up evaluation for left plantar foot ulcer. There is improvement in both appearance and measurement. We will continue with same treatment plan and he will follow up next week. 09/19/17 on evaluation today patient actually appears to be doing excellent in regard to his ulcer on the invitation site of his left great foot plantar aspect. He's been tolerating the dressing changes without complication. In fact with the Prisma and the current measures he has been shown signs of excellent improvement week by week up to this point. We have been to breeding the wound in this seems to have been of great benefit for him. Fortunately there is no evidence of infection. 09/26/17 on evaluation today patient appears to be doing rather well in regard to his wound. He did not know quite as much improvement this week as compared to last week. Nonetheless he still continues to show signs of improving to some degree. I do believe he may benefit from an offloading shoe. No fevers, chills, nausea, or vomiting noted at this time. 10/03/17 on evaluation today patient actually appears to be doing much better in regard to the amputation site plantar foot ulcer. Overall this appears significantly smaller even compared to previous. He's been tolerating the dressing changes without complication. He did get his  diabetic shoes and I did have a look at them they appear to be fairly good. Nonetheless I do think that for the time being I would probably recommend he continue with the offloading shoe that we have been utilizing just due to the fact that with the dressing I don't know that his diabetic shoes are going to work as appropriately as far as not called an additional pressure and irritation to the area in question. He understands. 10/10/17 on evaluation today patient actually appears to be doing very well in regard to his plantar foot ulcer. He has been tolerating the dressing changes Raymond [name], Raymond [name] ([id number]) [id number] Page 8 of 12 without complication. I do feel like he's making signs of good improvement and in fact of the wound bed appears to be better although it may not be significantly changed in size it appears healthier and I do believe is showing signs of improving. Nonetheless we gonna keep working towards healing as far as that is concerned. There is no evidence of infection. 10/17/17 on evaluation today patient actually appears to be doing poorly in regard to his plantar foot ulcer. Unfortunately other than just the area where the wound is itself there appears to be a blister that communicates with the wound unfortunately. This is more lateral to the wound itself and also to the distal point of the amputation site. There does not appear to be any evidence of cellulitis spreading at the foot but I do believe some of the drainage from the site itself is. When in nature. Nonetheless this blister area I think needs to be removed in order to allow for appropriate healing of the ulcer itself I think that is gonna be difficult for it to heal otherwise. This was discussed with the patient today. 10/24/17 on evaluation today patient actually appears to be doing better compared to last week even post debridement. He has shown signs of improvement he did test positive  for Staphylococcus aureus. With that being said he notes that he's not have any discomfort and in general he does feel like things seem to been doing better. Fortunately there's no additional blistering and no evidence of remaining infection at this time. No fevers, chills, nausea,  or vomiting noted at this time. He has three days of antibiotic left. 10/31/17 on evaluation today patient actually appears to be doing very well in regard to his ulcer on the left foot. He has been tolerating the dressing changes without complication. With that being said fortunately there appears to be no evidence of infection I do think he's made progress compared to previous. No fevers, chills, nausea, or vomiting noted at this time. 11/14/17 on evaluation today patient actually appears to be doing excellent in regard to his amputation site ulcer on his foot. In fact this appears to be completely healed at this point. There is no evidence of infection nor underline abscess and I did thoroughly evaluate the periwound location. Overall I'm very pleased with how things appear. Readmission: 01-03-2022 upon evaluation today patient appears for reevaluation here in the clinic although it has been since 2019 that I last saw him. This again has been over 4 years. With that being said he is having an issue with a wound on his foot unfortunately. He has not had any recent x-rays he tells me at this point which is unfortunate as avoid definitely can need to get something started that can be one of the primary items to get moving forward with. With that being said I also think were probably can obtain a wound culture he is probably going to require some antibiotics at some point. I just want to make sure that we have him on the right thing when we do this. Currently the patient tells me that his wounds have been present in regard to the met head for about a month at this point. With that being said he also has a wound on the third  toe which is the next toe this actually still present. This unfortunately has a lot of callus buildup around it but I think it is larger underneath than what it would appear on initial inspection. Patient does have a history of diabetes mellitus type 2, congestive heart failure, and COPD. Patient History Information obtained from Patient, Chart. Allergies No Known Allergies Family History Diabetes - Mother,Maternal Grandparents, Hypertension - Father, Stroke - Father, No family history of Cancer, Heart Disease, Hereditary Spherocytosis, Kidney Disease, Lung Disease, Seizures, Thyroid Problems, Tuberculosis. Social History Never smoker, Marital Status - Divorced, Alcohol Use - Rarely, Drug Use - No History, Caffeine Use - Never. Medical History Cardiovascular Patient has history of Congestive Heart Failure, Hypertension Endocrine Patient has history of Type II Diabetes Denies history of Type I Diabetes Neurologic Patient has history of Neuropathy - Feet Review of Systems (ROS) Respiratory Denies complaints or symptoms of Chronic or frequent coughs, Shortness of Breath. Gastrointestinal Denies complaints or symptoms of Frequent diarrhea, Nausea, Vomiting. Genitourinary Denies complaints or symptoms of Kidney failure/ Dialysis, Incontinence/dribbling. Immunological Denies complaints or symptoms of Hives, Itching. Integumentary (Skin) Complains or has symptoms of Wounds, Swelling. Denies complaints or symptoms of Bleeding or bruising tendency. Musculoskeletal Denies complaints or symptoms of Muscle Pain, Muscle Weakness. Psychiatric Denies complaints or symptoms of Anxiety, Claustrophobia. Objective Constitutional sitting or standing blood pressure is within target range for patient.. pulse regular and within target range for patient.Marland Kitchen respirations regular, non-labored and within target range for patient.Marland Kitchen temperature within target range for patient.. Well-nourished and  well-hydrated in no acute distress. Vitals Time Taken: 10:04 AM, Height: 74 in, Weight: 371 lbs, BMI: 47.6, Temperature: 98.0 F, Pulse: 73 bpm, Respiratory Rate: 16 breaths/min, Blood Raymond [name], Raymond [name] ([id number]) [id number] Page 9 of 12 Pressure:  127/75 mmHg. Eyes conjunctiva clear no eyelid edema noted. pupils equal round and reactive to light and accommodation. Ears, Nose, Mouth, and Throat no gross abnormality of ear auricles or external auditory canals. normal hearing noted during conversation. mucus membranes moist. Respiratory normal breathing without difficulty. Cardiovascular 1+ dorsalis pedis/posterior tibialis pulses. 1+ pitting edema of the bilateral lower extremities. Musculoskeletal normal gait and posture. no significant deformity or arthritic changes, no loss or range of motion, no clubbing. Psychiatric this patient is able to make decisions and demonstrates good insight into disease process. Alert and Oriented x 3. pleasant and cooperative. General Notes: Upon inspection patient's wound bed actually showed signs of necrotic tissue noted at both locations as well as some callus buildup I did perform debridement at both sites this is the third toe as well as the met head and at both locations I was able to clearway the necrotic debris here today without complication. Postdebridement the wound bed appeared to be doing much better. Nonetheless I do think we need to see about sending him for an x-ray to evaluate the foot and see if there is any evidence initially of osteomyelitis potentially present. Integumentary (Hair, Skin) Wound #2 status is Open. Original cause of wound was Gradually Appeared. The date acquired was: 12/13/2021. The wound is located on the Left,Medial Metatarsal head first. The wound measures 1.8cm length x 0.9cm width x 0.3cm depth; 1.272cm^2 area and 0.382cm^3 volume. There is Fat Layer (Subcutaneous Tissue) exposed. There  is no tunneling noted, however, there is undermining starting at 7:00 and ending at 11:00 with a maximum distance of 0.9cm. There is a medium amount of serous drainage noted. The wound margin is flat and intact. There is no granulation within the wound bed. There is a large (67-100%) amount of necrotic tissue within the wound bed including Adherent Slough. General Notes: Callus around wound . Wound #3 status is Open. Original cause of wound was Footwear Injury. The date acquired was: 01/03/2022. The wound is located on the Left T Third. The oe wound measures 0.9cm length x 0.9cm width x 0.2cm depth; 0.636cm^2 area and 0.127cm^3 volume. There is Fat Layer (Subcutaneous Tissue) exposed. There is no tunneling or undermining noted. There is a medium amount of serous drainage noted. The wound margin is flat and intact. There is no granulation within the wound bed. There is a large (67-100%) amount of necrotic tissue within the wound bed. 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 There was a Excisional Skin/Subcutaneous Tissue Debridement with a total area of 1.62 sq cm performed by Tommie Sams., PA-C. With the following instrument(s): Curette to remove Viable and Non-Viable tissue/material. Material removed includes Callus, Subcutaneous Tissue, and Slough. 1 specimen was taken by a Swab and sent to the lab per facility protocol. A time out was conducted at 10:44, prior to the start of the procedure. A Minimum amount of bleeding was controlled with  Pressure. The procedure was tolerated well. Post Debridement Measurements: 1.8cm length x 1cm width x 0.4cm depth; 0.565cm^3 volume. Character of Wound/Ulcer Post Debridement is stable. Severity of Tissue Post Debridement is: Fat layer exposed. Post procedure  Diagnosis Wound #2: Same as Pre-Procedure Wound #3 Pre-procedure diagnosis of Wound #3 is a Diabetic Wound/Ulcer of the Lower Extremity located on the Left T Third .Severity of Tissue Pre Debridement is: oe Fat layer exposed. There was a Excisional Skin/Subcutaneous Tissue Debridement with a total area of 0.25 sq cm performed by Tommie Sams., PA-C. With the following instrument(s): Curette to remove Viable and Non-Viable tissue/material. Material removed includes Callus, Subcutaneous Tissue, and Slough. 1 specimen was taken by a Swab and sent to the lab per facility protocol. A time out was conducted at 10:44, prior to the start of the procedure. A Minimum amount of bleeding was controlled with Pressure. The procedure was tolerated well. Post Debridement Measurements: 0.5cm length x 0.5cm width x 0.2cm depth; 0.039cm^3 volume. Character of Wound/Ulcer Post Debridement is stable. Severity of Tissue Post Debridement is: Limited to breakdown of skin. Post procedure Diagnosis Wound #3: Same as Pre-Procedure Plan Raymond [name], Raymond [name] ([id number]) [id number] Page 10 of 12 Follow-up Appointments: Wound #2 Left,Medial Metatarsal head first: Return Appointment in 1 week. Wound #3 Left T Third: oe Return Appointment in 1 week. Bathing/ Shower/ Hygiene: Wash wounds with antibacterial soap and water. May shower; gently cleanse wound with antibacterial soap, rinse and pat dry prior to dressing wounds Off-Loading: Open toe surgical shoe Laboratory ordered were: Wound culture routine - Left 5th met-head Radiology ordered were: X-ray, foot - Left foot three-view. Diabetic with non-healing wound on Left 1st met head  and Left 3rd toe. 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 Iodosorb dressing applied to both wounds. Services and Therapies ordered were: Ankle Brachial Index (ABI)-Bilateral - Left non-healing wounds on left 1st met head and 3rd toe. 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 WOUND #2: - Metatarsal head first Wound Laterality: Left, Medial Cleanser: Soap and Water 3 x Per Week/30 Days Discharge Instructions: Gently cleanse wound with antibacterial soap, rinse and pat dry prior to dressing wounds Prim Dressing: Iodosorb 40 (g) 3 x Per Week/30 Days ary Discharge Instructions: Apply IodoSorb to wound bed only as directed. Secondary Dressing: Gauze 3 x Per Week/30 Days Discharge Instructions: As directed: dry, moistened with saline or moistened with Dakins Solution Secured With: Medipore T - 18M Medipore H Soft Cloth Surgical T ape ape, 2x2 (in/yd) 3 x Per Week/30 Days WOUND #3: - T Third Wound Laterality: Left oe Cleanser: Soap and Water 3 x Per Week/30 Days Discharge Instructions: Gently cleanse wound with antibacterial soap, rinse and pat dry prior to dressing wounds Prim Dressing: Iodosorb 40 (g) 3 x Per Week/30 Days ary Discharge Instructions: Apply IodoSorb to wound bed only as directed. Secondary Dressing: Gauze 3 x Per Week/30 Days Discharge Instructions: As directed: dry, moistened with saline or moistened with Dakins Solution Secured With: Medipore T - 18M Medipore H Soft Cloth Surgical T ape ape, 2x2 (in/yd) 3 x Per Week/30 Days 1. Based on what I am seeing I do believe that the patient would benefit from initiation of therapy today with Iodosorb at both locations followed by Band-Aid the third toe and gauze covering the metatarsal head location. 2. I am going to go ahead and send the patient for an arterial study with ABI TBI he was noncompressible today I want to ensure that the blood flow is significant and sufficient to be able to heal the wound. 3. I am also going to suggest we  send him for an x-ray to evaluate for any signs of infection. 4. I would also lastly recommend that we  send a culture today and depending on the results of the culture will make any adjustments in therapy potentially add an antibiotic as needed. He is in agreement with the plan. We are going to place an open toed surgical shoe today. We will see patient back for reevaluation in 1 week here in the clinic. If anything worsens or changes patient will contact our office for additional recommendations. Electronic Signature(s) Signed: 01/03/2022 4:45:39 PM By: [name] PA-C Entered By: [name] on 01/03/2022 16:45:39 -------------------------------------------------------------------------------- ROS/PFSH Details Patient Name: Date of Service: Raymond Eke D. 01/03/2022 9:45 A M Medical Record Number: [id number] Patient Account Number: [id number] Date of Birth/Sex: Treating RN: 1966/01/29 (56 y.o. Male) Cornell Barman Primary Care Provider: Beverlyn Roux Other Clinician: Referring Provider: Treating Provider/Extender: Lockie Pares Weeks in Treatment: 0 Information Obtained From Patient Chart Respiratory Complaints and Symptoms: Negative for: Chronic or frequent coughs; Shortness of Breath Raymond [name], Raymond [name] ([id number]) [id number] Page 11 of 12 Gastrointestinal Complaints and Symptoms: Negative for: Frequent diarrhea; Nausea; Vomiting Genitourinary Complaints and Symptoms: Negative for: Kidney failure/ Dialysis; Incontinence/dribbling Immunological Complaints and Symptoms: Negative for: Hives; Itching Integumentary (Skin) Complaints and Symptoms: Positive for: Wounds; Swelling Negative for: Bleeding or bruising tendency Musculoskeletal Complaints and Symptoms: Negative for: Muscle Pain; Muscle Weakness Psychiatric Complaints and Symptoms: Negative for: Anxiety; Claustrophobia Cardiovascular Medical History: Positive  for: Congestive Heart Failure; Hypertension Endocrine Medical History: Positive for: Type II Diabetes Negative for: Type I Diabetes Time with diabetes: 15 years Treated with: Insulin, Oral agents Blood sugar tested every day: Yes Tested : PRN Neurologic Medical History: Positive for: Neuropathy - Feet Oncologic Immunizations Pneumococcal Vaccine: Received Pneumococcal Vaccination: No Tetanus Vaccine: Last tetanus shot: 09/05/2016 Implantable Devices No devices added Family and Social History Cancer: No; Diabetes: Yes - Mother,Maternal Grandparents; Heart Disease: No; Hereditary Spherocytosis: No; Hypertension: Yes - Father; Kidney Disease: No; Lung Disease: No; Seizures: No; Stroke: Yes - Father; Thyroid Problems: No; Tuberculosis: No; Never smoker; Marital Status - Divorced; Alcohol Use: Rarely; Drug Use: No History; Caffeine Use: Never; Financial Concerns: No; Food, Clothing or Shelter Needs: No; Support System Lacking: No; Transportation Concerns: No Engineer, maintenance) Signed: 01/03/2022 2:54:45 PM By: Gretta Cool, BSN, RN, CWS, Kim RN, BSN Signed: 01/03/2022 4:48:10 PM By: [name] PA-C Entered By: Gretta Cool, BSN, RN, CWS, Kim on 01/03/2022 10:13:40 Raymond [name] ([id number]) [id number] Page 12 of 12 -------------------------------------------------------------------------------- SuperBill Details Patient Name: Date of Service: Raymond [name] 01/03/2022 Medical Record Number: [id number] Patient Account Number: [id number] Date of Birth/Sex: Treating RN: [birth year] (56 y.o. Male) Cornell Barman Primary Care Provider: Beverlyn Roux Other Clinician: Referring Provider: Treating Provider/Extender: Lockie Pares Weeks in Treatment: 0 Diagnosis Coding ICD-10 Codes Code Description E11.621 Type 2 diabetes mellitus with foot ulcer L97.522 Non-pressure chronic ulcer of other part of left foot with fat layer exposed I50.42  Chronic combined systolic (congestive) and diastolic (congestive) heart failure J44.9 Chronic obstructive pulmonary disease, unspecified Facility Procedures : CPT4 Code: 43329518 Description: 99213 - WOUND CARE VISIT-LEV 3 EST PT Modifier: Quantity: 1 : CPT4 Code: 84166063 Description: 11042 - DEB SUBQ TISSUE 20 SQ CM/< ICD-10 Diagnosis Description L97.522 Non-pressure chronic ulcer of other part of left foot with fat layer exposed Modifier: Quantity: 1 Physician Procedures : CPT4 Code Description Modifier [id number] 32355 - WC PHYS LEVEL 4 - NEW PT 25 ICD-10 Diagnosis Description E11.621 Type 2 diabetes mellitus with foot ulcer L97.522 Non-pressure chronic ulcer of other part of left foot with  fat layer exposed I50.42 Chronic  combined systolic (congestive) and diastolic (congestive) heart failure J44.9 Chronic obstructive pulmonary disease, unspecified Quantity: 1 : [id number] 11042 - WC PHYS SUBQ TISS 20 SQ CM ICD-10 Diagnosis Description L97.522 Non-pressure chronic ulcer of other part of left foot with fat layer exposed Quantity: 1 Electronic Signature(s) Signed: 01/03/2022 4:45:55 PM By: [name] PA-C Entered By: [name] on 01/03/2022 16:45:55

## 2022-01-03 NOTE — Progress Notes (Signed)
TORRIN, CRIHFIELD ([id number]) [phone] [id number] Page 1 of 5 Visit Report for 01/03/2022 Abuse Risk Screen Details Patient Name: Date of Service: Raymond [name]. 01/03/2022 9:45 A M Medical Record Number: [id number] Patient Account Number: [id number] Date of Birth/Sex: Treating RN: Apr 11, 1965 (55 y.o. Raymond [name] Primary Care Ocean Kearley: [name] Other Clinician: Referring Cherylynn Liszewski: Treating Vallerie Hentz/Extender: [name] Weeks in Treatment: 0 Abuse Risk Screen Items Answer ABUSE RISK SCREEN: Has anyone close to you tried to hurt or harm you recentlyo No Do you feel uncomfortable with anyone in your familyo No Has anyone forced you do things that you didnt want to doo No Electronic Signature(s) Signed: 01/03/2022 2:54:45 PM By: Gretta Cool, BSN, RN, CWS, Kim RN, BSN Entered By: Gretta Cool, BSN, RN, CWS, Kim on 01/03/2022 10:13:46 -------------------------------------------------------------------------------- Activities of Daily Living Details Patient Name: Date of Service: Raymond Eke D. 01/03/2022 9:45 A M Medical Record Number: [id number] Patient Account Number: [id number] Date of Birth/Sex: Treating RN: 1965/11/02 (56 y.o. Raymond [name] Primary Care Raymond [name]: [name] Other Clinician: Referring Jadarion Halbig: Treating Darleth Eustache/Extender: [name] Weeks in Treatment: 0 Activities of Daily Living Items Answer Activities of Daily Living (Please select one for each item) Drive Automobile Completely Able T Medications ake Completely Able Use T elephone Completely Able Care for Appearance Completely Able Use T oilet Completely Able Bath / Shower Completely Able Dress Self Completely Able Feed Self Completely Able Walk Completely Able Get In / Out Bed Completely Able Housework Completely Able Raymond, [name] ([id number]) [phone] [id number] Page 2 of 5 Prepare  Meals Completely Able Handle Money Completely Able Shop for Self Completely Able Electronic Signature(s) Signed: 01/03/2022 2:54:45 PM By: Gretta Cool, BSN, RN, CWS, Kim RN, BSN Entered By: Gretta Cool, BSN, RN, CWS, Kim on 01/03/2022 10:14:03 -------------------------------------------------------------------------------- Education Screening Details Patient Name: Date of Service: Raymond Eke D. 01/03/2022 9:45 A M Medical Record Number: [id number] Patient Account Number: [id number] Date of Birth/Sex: Treating RN: 1965/10/26 (56 y.o. Raymond [name] Primary Care Raymond [name]: [name] Other Clinician: Referring Alisan Dokes: Treating Raymond [name]/Extender: Hazle Coca in Treatment: 0 Learning Preferences/Education Level/Primary Language Learning Preference: Explanation Highest Education Level: College or Above Preferred Language: English Cognitive Barrier Language Barrier: No Translator Needed: No Memory Deficit: No Emotional Barrier: No Physical Barrier Impaired Vision: Yes Glasses Impaired Hearing: No Decreased Hand dexterity: No Knowledge/Comprehension Knowledge Level: High Comprehension Level: High Ability to understand written instructions: High Ability to understand verbal instructions: High Motivation Anxiety Level: Calm Cooperation: Cooperative Education Importance: Acknowledges Need Interest in Health Problems: Asks Questions Perception: Coherent Willingness to Engage in Self-Management High Activities: Readiness to Engage in Self-Management High Activities: Electronic Signature(s) Signed: 01/03/2022 2:54:45 PM By: Gretta Cool, BSN, RN, CWS, Kim RN, BSN Entered By: Gretta Cool, BSN, RN, CWS, Kim on 01/03/2022 10:14:37 [name] ([id number]) [id number] Page 3 of 5 -------------------------------------------------------------------------------- Fall Risk Assessment Details Patient Name: Date of Service: Raymond [name]. 01/03/2022 9:45 A M Medical Record Number: [id number] Patient Account Number: [id number] Date of Birth/Sex: Treating RN: 1965/08/24 (56 y.o. [name], [name] Primary Care Raymond [name]: [name] Other Clinician: Referring Raymond [name]: Treating Raymond [name]/Extender: Hazle Coca in Treatment: 0 Fall Risk Assessment Items Have you had 2 or more falls in the last 12 monthso 0 No Have you had any fall that resulted in injury in the last 12 monthso 0 No FALLS RISK SCREEN History of falling - immediate or within 3 months 0 No  Secondary diagnosis (Do you have 2 or more medical diagnoseso) 15 Yes Ambulatory aid None/bed rest/wheelchair/nurse 0 Yes Crutches/cane/walker 0 No Furniture 0 No Intravenous therapy Access/Saline/Heparin Lock 0 No Gait/Transferring Normal/ bed rest/ wheelchair 0 Yes Weak (short steps with or without shuffle, stooped but able to lift head while walking, may seek 0 No support from furniture) Impaired (short steps with shuffle, may have difficulty arising from chair, head down, impaired 0 No balance) Mental Status Oriented to own ability 0 Yes Electronic Signature(s) Signed: 01/03/2022 2:54:45 PM By: Gretta Cool, BSN, RN, CWS, Kim RN, BSN Entered By: Gretta Cool, BSN, RN, CWS, Kim on 01/03/2022 10:15:02 -------------------------------------------------------------------------------- Foot Assessment Details Patient Name: Date of Service: Raymond Eke D. 01/03/2022 9:45 A M Medical Record Number: [id number] Patient Account Number: [id number] Date of Birth/Sex: Treating RN: 08/14/1965 (56 y.o. Raymond [name] Primary Care Raymond [name]: [name] Other Clinician: Referring Raymond [name]: Treating Raymond [name]/Extender: Hazle Coca in Treatment: 0 Foot Assessment Items Site Locations YOSGART, PAVEY D ([id number]) [phone] [id number] Page 4 of 5 + = Sensation present, - = Sensation absent, C =  Callus, U = Ulcer R = Redness, W = Warmth, M = Maceration, PU = Pre-ulcerative lesion F = Fissure, S = Swelling, D = Dryness Assessment Right: Left: Other Deformity: No No Prior Foot Ulcer: No No Prior Amputation: No No Charcot Joint: No No Ambulatory Status: Ambulatory Without Help Gait: Steady Electronic Signature(s) Signed: 01/03/2022 2:54:45 PM By: Gretta Cool, BSN, RN, CWS, Kim RN, BSN Entered By: Gretta Cool, BSN, RN, CWS, Kim on 01/03/2022 10:15:35 -------------------------------------------------------------------------------- Nutrition Risk Screening Details Patient Name: Date of Service: Raymond Eke D. 01/03/2022 9:45 A M Medical Record Number: [id number] Patient Account Number: [id number] Date of Birth/Sex: Treating RN: [birth year] (56 y.o. [name], [name] Primary Care Ramil Edgington: [name] Other Clinician: Referring Jaiel Saraceno: Treating Anush Wiedeman/Extender: [name] Weeks in Treatment: 0 Height (in): 74 Weight (lbs): 371 Body Mass Index (BMI): 47.6 Nutrition Risk Screening Items Score Screening NUTRITION RISK SCREEN: I have an illness or condition that made me change the kind and/or amount of food I eat 0 No I eat fewer than two meals per day 0 No I eat few fruits and vegetables, or milk products 0 No I have three or more drinks of beer, liquor or wine almost every day 0 No I have tooth or mouth problems that make it hard for me to eat 0 No I don't always have enough money to buy the food I need 0 No HENLEY, BOETTNER ([id number]) [phone] [id number] Page 5 of 5 I eat alone most of the time 0 No I take three or more different prescribed or over-the-counter drugs a day 1 Yes Without wanting to, I have lost or gained 10 pounds in the last six months 0 No I am not always physically able to shop, cook and/or feed myself 0 No Nutrition Protocols Good Risk Protocol 0 No interventions needed Moderate Risk Protocol High Risk  Proctocol Risk Level: Good Risk Score: 1 Electronic Signature(s) Signed: 01/03/2022 2:54:45 PM By: Gretta Cool, BSN, RN, CWS, Kim RN, BSN Entered By: Gretta Cool, BSN, RN, CWS, Kim on 01/03/2022 10:15:17

## 2022-01-04 ENCOUNTER — Other Ambulatory Visit: Payer: Self-pay | Admitting: Physician Assistant

## 2022-01-04 DIAGNOSIS — E11621 Type 2 diabetes mellitus with foot ulcer: Secondary | ICD-10-CM

## 2022-01-06 LAB — AEROBIC CULTURE W GRAM STAIN (SUPERFICIAL SPECIMEN): Gram Stain: NONE SEEN

## 2022-01-10 ENCOUNTER — Ambulatory Visit
Admission: RE | Admit: 2022-01-10 | Discharge: 2022-01-10 | Disposition: A | Discharge status: Home / Self Care | Payer: Medicare HMO | Source: Ambulatory Visit | Attending: Physician Assistant | Admitting: Physician Assistant

## 2022-01-10 DIAGNOSIS — L97509 Non-pressure chronic ulcer of other part of unspecified foot with unspecified severity: Secondary | ICD-10-CM | POA: Insufficient documentation

## 2022-01-10 DIAGNOSIS — E11621 Type 2 diabetes mellitus with foot ulcer: Secondary | ICD-10-CM | POA: Diagnosis present

## 2022-01-10 MED ORDER — GADOBUTROL 1 MMOL/ML IV SOLN
10.0000 mL | Freq: Once | INTRAVENOUS | Status: AC | PRN
  Administered 2022-01-10: 10 mL via INTRAVENOUS

## 2022-01-11 ENCOUNTER — Encounter: Payer: Medicare HMO | Admitting: Physician Assistant

## 2022-01-11 DIAGNOSIS — E11621 Type 2 diabetes mellitus with foot ulcer: Secondary | ICD-10-CM | POA: Diagnosis not present

## 2022-01-11 NOTE — Progress Notes (Signed)
Raymond [name], Raymond [name] ([id number]) [id number] Page 1 of 9 Visit Report for 01/11/2022 Chief Complaint Document Details Patient Name: Date of Service: Raymond [name]. 01/11/2022 11:30 A M Medical Record Number: [id number] Patient Account Number: [id number] Date of Birth/Sex: Treating RN: 03/25/1965 (56 y.o. Raymond [name] Primary Care Provider: [name] Other Clinician: Referring Provider: Treating Provider/Extender: [name] Weeks in Treatment: 1 Information Obtained from: Patient Chief Complaint Left foot ulcer Electronic Signature(s) Signed: 01/11/2022 11:46:30 AM By: [name] PA-C Entered By: [name] on 01/11/2022 11:46:30 -------------------------------------------------------------------------------- Debridement Details Patient Name: Date of Service: Raymond Eke D. 01/11/2022 11:30 A M Medical Record Number: [id number] Patient Account Number: [id number] Date of Birth/Sex: Treating RN: Sep 26, 1965 (56 y.o. Raymond [name] Primary Care Provider: [name] Other Clinician: Referring Provider: Treating Provider/Extender: [name] Weeks in Treatment: 1 Debridement Performed for Assessment: Wound #3 Left T Third oe Performed By: Physician Tommie Sams., PA-C Debridement Type: Debridement Severity of Tissue Pre Debridement: Fat layer exposed Level of Consciousness (Pre-procedure): Awake and Alert Pre-procedure Verification/Time Out Yes - 12:11 Taken: Start Time: 12:11 T Area Debrided (L x W): otal 1.5 (cm) x 1.5 (cm) = 2.25 (cm) Tissue and other material debrided: Non-Viable, Slough, Slough Level: Non-Viable Tissue Debridement Description: Selective/Open Wound Instrument: Forceps, Scissors Bleeding: None Response to Treatment: Procedure was tolerated well Level of Consciousness (Post- Awake and Alert procedure): Post Debridement Measurements of Total  Wound Raymond [name], Raymond [name] ([id number]) [id number] Page 2 of 9 Length: (cm) 1.5 Width: (cm) 1.5 Depth: (cm) 0.4 Volume: (cm) 0.707 Character of Wound/Ulcer Post Debridement: Stable Severity of Tissue Post Debridement: Fat layer exposed Post Procedure Diagnosis Same as Pre-procedure Electronic Signature(s) Unsigned Entered By: Rosalio Loud on 01/11/2022 12:12:24 -------------------------------------------------------------------------------- HPI Details Patient Name: Date of Service: Raymond Eke D. 01/11/2022 11:30 A M Medical Record Number: [id number] Patient Account Number: [id number] Date of Birth/Sex: Treating RN: 04/21/1965 (56 y.o. Raymond [name] Primary Care Provider: [name] Other Clinician: Referring Provider: Treating Provider/Extender: [name] Weeks in Treatment: 1 History of Present Illness HPI Description: 09/05/17-He is seeing an initial evaluation for a left plantar foot ulcer. He has a remote history of left great toe amputation. He states that 4-6 weeks ago he noted callus formation and ulceration. He has not seen primary care regarding this. He is not currently on antibiotic therapy. He does not routinely follow with podiatry. He states diabetic foot wear will arrive early next week. The EHR shows an A1c of 9% approximate 4 months ago but he states he had one and primary care a few weeks ago but does not know the results. He is neuropathic and does not complain of any pain, he is currently wearing crocs. 09/12/17-he is here in follow up evaluation for left plantar foot ulcer. There is improvement in both appearance and measurement. We will continue with same treatment plan and he will follow up next week. 09/19/17 on evaluation today patient actually appears to be doing excellent in regard to his ulcer on the invitation site of his left great foot plantar aspect. He's been tolerating the dressing  changes without complication. In fact with the Prisma and the current measures he has been shown signs of excellent improvement week by week up to this point. We have been to breeding the wound in this seems to have been of great benefit for him. Fortunately there is no evidence of infection. 09/26/17 on evaluation today  patient appears to be doing rather well in regard to his wound. He did not know quite as much improvement this week as compared to last week. Nonetheless he still continues to show signs of improving to some degree. I do believe he may benefit from an offloading shoe. No fevers, chills, nausea, or vomiting noted at this time. 10/03/17 on evaluation today patient actually appears to be doing much better in regard to the amputation site plantar foot ulcer. Overall this appears significantly smaller even compared to previous. He's been tolerating the dressing changes without complication. He did get his diabetic shoes and I did have a look at them they appear to be fairly good. Nonetheless I do think that for the time being I would probably recommend he continue with the offloading shoe that we have been utilizing just due to the fact that with the dressing I don't know that his diabetic shoes are going to work as appropriately as far as not called an additional pressure and irritation to the area in question. He understands. 10/10/17 on evaluation today patient actually appears to be doing very well in regard to his plantar foot ulcer. He has been tolerating the dressing changes without complication. I do feel like he's making signs of good improvement and in fact of the wound bed appears to be better although it may not be significantly changed in size it appears healthier and I do believe is showing signs of improving. Nonetheless we gonna keep working towards healing as far as that is concerned. There is no evidence of infection. 10/17/17 on evaluation today patient actually appears to be  doing poorly in regard to his plantar foot ulcer. Unfortunately other than just the area where the wound is itself there appears to be a blister that communicates with the wound unfortunately. This is more lateral to the wound itself and also to the distal point of the amputation site. There does not appear to be any evidence of cellulitis spreading at the foot but I do believe some of the drainage from the site itself is. When in nature. Nonetheless this blister area I think needs to be removed in order to allow for appropriate healing of the ulcer itself I think that is gonna be difficult for it to heal otherwise. This was discussed with the patient today. 10/24/17 on evaluation today patient actually appears to be doing better compared to last week even post debridement. He has shown signs of improvement he did test positive for Staphylococcus aureus. With that being said he notes that he's not have any discomfort and in general he does feel like things seem to been doing better. Fortunately there's no additional blistering and no evidence of remaining infection at this time. No fevers, chills, nausea, or vomiting noted at this time. He has three days of antibiotic left. Raymond [name], Raymond [name] ([id number]) [id number] Page 3 of 9 10/31/17 on evaluation today patient actually appears to be doing very well in regard to his ulcer on the left foot. He has been tolerating the dressing changes without complication. With that being said fortunately there appears to be no evidence of infection I do think he's made progress compared to previous. No fevers, chills, nausea, or vomiting noted at this time. 11/14/17 on evaluation today patient actually appears to be doing excellent in regard to his amputation site ulcer on his foot. In fact this appears to be completely healed at this point. There is no evidence of infection nor underline abscess and I  did thoroughly evaluate the  periwound location. Overall I'm very pleased with how things appear. Readmission: 01-03-2022 upon evaluation today patient appears for reevaluation here in the clinic although it has been since 2019 that I last saw him. This again has been over 4 years. With that being said he is having an issue with a wound on his foot unfortunately. He has not had any recent x-rays he tells me at this point which is unfortunate as avoid definitely can need to get something started that can be one of the primary items to get moving forward with. With that being said I also think were probably can obtain a wound culture he is probably going to require some antibiotics at some point. I just want to make sure that we have him on the right thing when we do this. Currently the patient tells me that his wounds have been present in regard to the met head for about a month at this point. With that being said he also has a wound on the third toe which is the next toe this actually still present. This unfortunately has a lot of callus buildup around it but I think it is larger underneath than what it would appear on initial inspection. Patient does have a history of diabetes mellitus type 2, congestive heart failure, and COPD. 01-11-2022 upon evaluation patient's wounds actually are showing signs of doing about the same may be just slightly cleaner but definitely not where we want things to be as of yet. Fortunately there does not appear to be any signs of active infection locally nor systemically at this point which is great news. No fevers, chills, nausea, vomiting, or diarrhea. Of note patient has had his MRI but we do not have the results of that as of yet we are still waiting for the reading on this at this point. Electronic Signature(s) Signed: 01/11/2022 1:02:19 PM By: [name] PA-C Entered By: [name] on 01/11/2022  13:02:19 -------------------------------------------------------------------------------- Physical Exam Details Patient Name: Date of Service: Raymond Eke D. 01/11/2022 11:30 A M Medical Record Number: [id number] Patient Account Number: [id number] Date of Birth/Sex: Treating RN: Sep 03, 1965 (56 y.o. Raymond [name] Primary Care Provider: [name] Other Clinician: Referring Provider: Treating Provider/Extender: [name] Weeks in Treatment: 1 Constitutional Well-nourished and well-hydrated in no acute distress. Respiratory normal breathing without difficulty. Psychiatric this patient is able to make decisions and demonstrates good insight into disease process. Alert and Oriented x 3. pleasant and cooperative. Notes Upon inspection patient's wound still showed signs of some necrotic slough buildup he actually has his arterial study on the 20th once he has that complete we can attempt some light debridements but right now I did not want to venture too far into that until we get the results back of his vascular study. I did perform a very light debridement on the toe using scissors and forceps no bleeding noted and only necrotic tissue removed. Electronic Signature(s) Signed: 01/11/2022 1:22:12 PM By: [name] PA-C Entered By: [name] on 01/11/2022 13:22:12 Raymond [name] ([id number]) [id number] Page 4 of 9 -------------------------------------------------------------------------------- Physician Orders Details Patient Name: Date of Service: Raymond [name] 01/11/2022 11:30 A M Medical Record Number: [id number] Patient Account Number: [id number] Date of Birth/Sex: Treating RN: 1965-11-26 (56 y.o. 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 Wound #3 Left T Third oe Return Appointment in 1 week. Bathing/ [employer] wounds with antibacterial soap and water. May shower; gently cleanse wound with antibacterial soap, rinse and pat dry prior to dressing wounds Off-Loading Open toe surgical shoe Wound Treatment Wound #2 - Metatarsal head first Wound Laterality: Left, Medial Cleanser: [employer] Ancillary Kit - 15 Day Supply (DME) (Generic) 3 x Per Week/30 Days Discharge Instructions: Use supplies as instructed; Kit contains: (15) Saline Bullets; (15) 3x3 Gauze; 15 pr Gloves Cleanser: Soap and Water 3 x Per Week/30 Days Discharge Instructions: Gently cleanse wound with antibacterial soap, rinse and pat dry prior to dressing wounds Prim Dressing: Iodosorb 40 (g) (DME) (Dispense As Written) 3 x Per Week/30 Days ary Discharge Instructions: Apply IodoSorb to wound bed only as directed. Secondary Dressing: Gauze 3 x Per Week/30 Days Discharge Instructions: As directed: dry, moistened with saline or moistened with Dakins Solution Secured With: Medipore T - 27M Medipore H Soft Cloth Surgical T ape ape, 2x2 (in/yd) 3 x Per Week/30 Days Wound #3 - T Third oe Wound Laterality: Left Cleanser: Soap and Water 3 x Per Week/30 Days Discharge Instructions: Gently cleanse wound with antibacterial soap, rinse and pat dry prior to dressing wounds Prim Dressing: Iodosorb 40 (g) (DME) (Dispense As Written) 3 x Per Week/30 Days ary Discharge Instructions: Apply IodoSorb to wound bed only as directed. Secondary Dressing:  Gauze 3 x Per Week/30 Days Discharge Instructions: As directed: dry, moistened with saline or moistened with Dakins Solution Secured With: Medipore T - 27M Medipore H Soft Cloth Surgical T ape ape, 2x2 (in/yd) 3 x Per Week/30 Days Raymond [name], Raymond [name] ([id number]) [id number] Page 5 of 9 Electronic Signature(s) Unsigned Entered By: Rosalio Loud on 01/11/2022 12:19:52 -------------------------------------------------------------------------------- Problem List Details Patient Name: Date of Service: Raymond [name] 01/11/2022 11:30 A M Medical Record Number: [id number] Patient Account Number: [id number] Date of Birth/Sex: Treating RN: [birth year] (56 y.o. Raymond [name] Primary Care Provider: [name] Other Clinician: Referring Provider: Treating Provider/Extender: [name] Weeks in Treatment: 1 Active Problems ICD-10 Encounter Code Description Active Date MDM Diagnosis E11.621 Type 2 diabetes mellitus with foot ulcer 01/03/2022 No Yes L97.522 Non-pressure chronic ulcer of other part of left foot with fat layer exposed 01/03/2022 No Yes I50.42 Chronic combined systolic (congestive) and diastolic (congestive) heart failure 01/03/2022 No Yes J44.9 Chronic obstructive pulmonary disease, unspecified 01/03/2022 No Yes Inactive Problems Resolved Problems Electronic Signature(s) Signed: 01/11/2022 11:46:26 AM By: [name] PA-C Entered By: [name] on 01/11/2022 11:46:26 Progress Note Details -------------------------------------------------------------------------------- Raymond [name] ([id number]) [id number] Page 6 of 9 Patient Name: Date of Service: Raymond [name] 01/11/2022 11:30 A M Medical Record Number: [id number] Patient Account Number: [id number] Date of Birth/Sex: Treating RN: [birth year] (56 y.o. Raymond [name] Primary Care Provider: [name] Other  Clinician: Referring Provider: Treating Provider/Extender: [name] Weeks in Treatment: 1 Subjective Chief Complaint Information obtained from Patient Left foot ulcer History of Present Illness (HPI) 09/05/17-He is seeing an initial evaluation for a left plantar foot ulcer. He has a remote history of left great toe amputation. He states  that 4-6 weeks ago he noted callus formation and ulceration. He has not seen primary care regarding this. He is not currently on antibiotic therapy. He does not routinely follow with podiatry. He states diabetic foot wear will arrive early next week. The EHR shows an A1c of 9% approximate 4 months ago but he states he had one and primary care a few weeks ago but does not know the results. He is neuropathic and does not complain of any pain, he is currently wearing crocs. 09/12/17-he is here in follow up evaluation for left plantar foot ulcer. There is improvement in both appearance and measurement. We will continue with same treatment plan and he will follow up next week. 09/19/17 on evaluation today patient actually appears to be doing excellent in regard to his ulcer on the invitation site of his left great foot plantar aspect. He's been tolerating the dressing changes without complication. In fact with the Prisma and the current measures he has been shown signs of excellent improvement week by week up to this point. We have been to breeding the wound in this seems to have been of great benefit for him. Fortunately there is no evidence of infection. 09/26/17 on evaluation today patient appears to be doing rather well in regard to his wound. He did not know quite as much improvement this week as compared to last week. Nonetheless he still continues to show signs of improving to some degree. I do believe he may benefit from an offloading shoe. No fevers, chills, nausea, or vomiting noted at this time. 10/03/17 on evaluation today patient actually  appears to be doing much better in regard to the amputation site plantar foot ulcer. Overall this appears significantly smaller even compared to previous. He's been tolerating the dressing changes without complication. He did get his diabetic shoes and I did have a look at them they appear to be fairly good. Nonetheless I do think that for the time being I would probably recommend he continue with the offloading shoe that we have been utilizing just due to the fact that with the dressing I don't know that his diabetic shoes are going to work as appropriately as far as not called an additional pressure and irritation to the area in question. He understands. 10/10/17 on evaluation today patient actually appears to be doing very well in regard to his plantar foot ulcer. He has been tolerating the dressing changes without complication. I do feel like he's making signs of good improvement and in fact of the wound bed appears to be better although it may not be significantly changed in size it appears healthier and I do believe is showing signs of improving. Nonetheless we gonna keep working towards healing as far as that is concerned. There is no evidence of infection. 10/17/17 on evaluation today patient actually appears to be doing poorly in regard to his plantar foot ulcer. Unfortunately other than just the area where the wound is itself there appears to be a blister that communicates with the wound unfortunately. This is more lateral to the wound itself and also to the distal point of the amputation site. There does not appear to be any evidence of cellulitis spreading at the foot but I do believe some of the drainage from the site itself is. When in nature. Nonetheless this blister area I think needs to be removed in order to allow for appropriate healing of the ulcer itself I think that is gonna be difficult for it to heal  otherwise. This was discussed with the patient today. 10/24/17 on evaluation today  patient actually appears to be doing better compared to last week even post debridement. He has shown signs of improvement he did test positive for Staphylococcus aureus. With that being said he notes that he's not have any discomfort and in general he does feel like things seem to been doing better. Fortunately there's no additional blistering and no evidence of remaining infection at this time. No fevers, chills, nausea, or vomiting noted at this time. He has three days of antibiotic left. 10/31/17 on evaluation today patient actually appears to be doing very well in regard to his ulcer on the left foot. He has been tolerating the dressing changes without complication. With that being said fortunately there appears to be no evidence of infection I do think he's made progress compared to previous. No fevers, chills, nausea, or vomiting noted at this time. 11/14/17 on evaluation today patient actually appears to be doing excellent in regard to his amputation site ulcer on his foot. In fact this appears to be completely healed at this point. There is no evidence of infection nor underline abscess and I did thoroughly evaluate the periwound location. Overall I'm very pleased with how things appear. Readmission: 01-03-2022 upon evaluation today patient appears for reevaluation here in the clinic although it has been since 2019 that I last saw him. This again has been over 4 years. With that being said he is having an issue with a wound on his foot unfortunately. He has not had any recent x-rays he tells me at this point which is unfortunate as avoid definitely can need to get something started that can be one of the primary items to get moving forward with. With that being said I also think were probably can obtain a wound culture he is probably going to require some antibiotics at some point. I just want to make sure that we have him on the right thing when we do this. Currently the patient tells me that  his wounds have been present in regard to the met head for about a month at this point. With that being said he also has a wound on the third toe which is the next toe this actually still present. This unfortunately has a lot of callus buildup around it but I think it is larger underneath than what it would appear on initial inspection. Patient does have a history of diabetes mellitus type 2, congestive heart failure, and COPD. 01-11-2022 upon evaluation patient's wounds actually are showing signs of doing about the same may be just slightly cleaner but definitely not where we want things to be as of yet. Fortunately there does not appear to be any signs of active infection locally nor systemically at this point which is great news. No fevers, chills, nausea, vomiting, or diarrhea. Of note patient has had his MRI but we do not have the results of that as of yet we are still waiting for the reading on this at this point. [address] Raymond [name], Raymond [name] ([id number]) [id number] Page 7 of 9 Constitutional Well-nourished and well-hydrated in no acute distress. Vitals Time Taken: 11:54 AM, Height: 74 in, Weight: 371 lbs, BMI: 47.6, Temperature: 98.2 F, Pulse: 79 bpm, Respiratory Rate: 16 breaths/min, Blood Pressure: 159/76 mmHg. Respiratory normal breathing without difficulty. Psychiatric this patient is able to make decisions and demonstrates good insight into disease process. Alert and Oriented x 3. pleasant and cooperative. General Notes: Upon inspection patient's wound  still showed signs of some necrotic slough buildup he actually has his arterial study on the 20th once he has that complete we can attempt some light debridements but right now I did not want to venture too far into that until we get the results back of his vascular study. I did perform a very light debridement on the toe using scissors and forceps no bleeding noted and only necrotic tissue  removed. Integumentary (Hair, Skin) Wound #2 status is Open. Original cause of wound was Gradually Appeared. The date acquired was: 12/13/2021. The wound has been in treatment 1 weeks. The wound is located on the Left,Medial Metatarsal head first. The wound measures 2cm length x 0.9cm width x 0.2cm depth; 1.414cm^2 area and 0.283cm^3 volume. There is Fat Layer (Subcutaneous Tissue) exposed. There is no tunneling noted, however, there is undermining starting at 12:00 and ending at 1:00 with a maximum distance of 1.5cm. There is a medium amount of serous drainage noted. The wound margin is flat and intact. There is no granulation within the wound bed. There is a large (67-100%) amount of necrotic tissue within the wound bed including Adherent Slough. Wound #3 status is Open. Original cause of wound was Footwear Injury. The date acquired was: 01/03/2022. The wound has been in treatment 1 weeks. The wound is located on the Left T Third. The wound measures 1.5cm length x 1.5cm width x 0.4cm depth; 1.767cm^2 area and 0.707cm^3 volume. There is Fat oe Layer (Subcutaneous Tissue) exposed. There is a medium amount of serous drainage noted. The wound margin is flat and intact. There is no granulation within the wound bed. There is a large (67-100%) amount of necrotic tissue within the wound bed. 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 There was a Selective/Open Wound Non-Viable Tissue Debridement with a total area of 2.25 sq cm performed by Tommie Sams., PA-C. With the  following instrument(s): Forceps, and Scissors to remove Non-Viable tissue/material. Material removed includes [place]. A time out was conducted at 12:11, prior to the start of the procedure. There was no bleeding. The procedure was tolerated well. Post Debridement Measurements: 1.5cm length x 1.5cm width x 0.4cm depth; 0.707cm^3 volume. Character of Wound/Ulcer Post Debridement is stable. Severity of Tissue Post Debridement is: Fat layer exposed. Post procedure Diagnosis Wound #3: Same as Pre-Procedure Plan Follow-up Appointments: Wound #2 Left,Medial Metatarsal head first: Return Appointment in 1 week. Wound #3 Left T Third: oe Return Appointment in 1 week. Bathing/ Shower/ Hygiene: Wash wounds with antibacterial soap and water. May shower; gently cleanse wound with antibacterial soap, rinse and pat dry prior to dressing wounds Off-Loading: Open toe surgical shoe WOUND #2: - Metatarsal head first Wound Laterality: Left, Medial Cleanser: [place] Ancillary Kit - 15 Day Supply (DME) (Generic) 3 x Per Week/30 Days Discharge Instructions: Use supplies as instructed; Kit contains: (15) Saline Bullets; (15) 3x3 Gauze; 15 pr Gloves Cleanser: Soap and Water 3 x Per Week/30 Days Discharge Instructions: Gently cleanse wound with antibacterial soap, rinse and pat dry prior to dressing wounds Prim Dressing: Iodosorb 40 (g) (DME) (Dispense As Written) 3 x Per Week/30 Days ary Discharge Instructions:  Apply IodoSorb to wound bed only as directed. Secondary Dressing: Gauze 3 x Per Week/30 Days Discharge Instructions: As directed: dry, moistened with saline or moistened with Dakins Solution Secured With: Medipore T - 34M Medipore H Soft Cloth Surgical T ape ape, 2x2 (in/yd) 3 x Per Week/30 Days WOUND #3: - T Third Wound Laterality: Left Raymond [name], Raymond [name] ([id number]) [id number] Page 8 of 9 Cleanser: Soap and Water 3 x Per Week/30 Days Discharge Instructions: Gently  cleanse wound with antibacterial soap, rinse and pat dry prior to dressing wounds Prim Dressing: Iodosorb 40 (g) (DME) (Dispense As Written) 3 x Per Week/30 Days ary Discharge Instructions: Apply IodoSorb to wound bed only as directed. Secondary Dressing: Gauze 3 x Per Week/30 Days Discharge Instructions: As directed: dry, moistened with saline or moistened with Dakins Solution Secured With: Medipore T - 34M Medipore H Soft Cloth Surgical T ape ape, 2x2 (in/yd) 3 x Per Week/30 Days 1. I am and I continue to recommend at this point that we have the patient monitor for any signs of worsening infection of see if anything gets significantly worse he should go to the ER for further evaluation and treatment. 2. I am also can recommend at this time that we have the patient continue with an Iodosorb treatment which I think is doing a good job here. 3. I am also can recommend patient should continue with gauze to cover and roll gauze to secure in place. 4. He should also try to keep off of this is much as possible obviously total contact cast could be considered but right now I do not think were quite at that point currently. Again we will see how things continue to progress over the next few weeks especially as we get the results of the testing back for his blood flow. We will see patient back for reevaluation in 1 week here in the clinic. If anything worsens or changes patient will contact our office for additional recommendations. Electronic Signature(s) Signed: 01/11/2022 1:23:19 PM By: [name] PA-C Entered By: [name] on 01/11/2022 13:23:19 -------------------------------------------------------------------------------- SuperBill Details Patient Name: Date of Service: Raymond [name], Raymond Negus D. 01/11/2022 Medical Record Number: [id number] Patient Account Number: [id number] Date of Birth/Sex: Treating RN: [birth year] (56 y.o. Raymond [name] Primary Care Provider: [name]  Other Clinician: Referring Provider: Treating Provider/Extender: [name] Weeks in Treatment: 1 Diagnosis Coding ICD-10 Codes Code Description 9395929620 Type 2 diabetes mellitus with foot ulcer L97.522 Non-pressure chronic ulcer of other part of left foot with fat layer exposed I50.42 Chronic combined systolic (congestive) and diastolic (congestive) heart failure J44.9 Chronic obstructive pulmonary disease, unspecified Facility Procedures : CPT4 Code: [id number] Description: 67341 - DEBRIDE WOUND 1ST 20 SQ CM OR < ICD-10 Diagnosis Description L97.522 Non-pressure chronic ulcer of other part of left foot with fat layer exposed Modifier: Quantity: 1 Physician Procedures : CPT4 Code Description Modifier [id number] - WC PHYS LEVEL 3 - EST PT 25 ICD-10 Diagnosis Description E11.621 Type 2 diabetes mellitus with foot ulcer L97.522 Non-pressure chronic ulcer of other part of left foot with fat layer exposed I50.42 Chronic  combined systolic (congestive) and diastolic (congestive) heart failure J44.9 Chronic obstructive pulmonary disease, unspecified Quantity: 1 : [id number] - WC PHYS DEBR WO ANESTH 20 SQ CM Raymond [name], Raymond [name] ([id number]) [id number] [place] ICD-10 Diagnosis Description L97.522 Non-pressure chronic ulcer of other part of left foot with fat layer exposed Quantity: 1 ge 9 of 9 Electronic Signature(s) Signed: 01/11/2022 1:25:19  PM By: [name] PA-C Previous Signature: 01/11/2022 1:24:59 PM Version By: [name] PA-C Entered By: [name] on 01/11/2022 13:25:18

## 2022-01-11 NOTE — Progress Notes (Signed)
AARIZ, MAISH ([id number]) [id number] Page 1 of 12 Visit Report for 01/11/2022 Arrival Information Details Patient Name: Date of Service: Raymond [name] 01/11/2022 11:30 A M Medical Record Number: [id number] Patient Account Number: [id number] Date of Birth/Sex: Treating RN: [birth year] (56 y.o. Raymond [name] Primary Care Raymond [name]: Raymond [name] Other Clinician: Referring Raymond [name]: Treating Raymond [name]/Extender: Raymond [name] in Treatment: 1 Visit Information History Since Last Visit Added or deleted any medications: No Patient Arrived: Ambulatory Any new allergies or adverse reactions: No Arrival Time: 11:48 Had a fall or experienced change in No Accompanied By: self activities of daily living that may affect Transfer Assistance: None risk of falls: Patient Identification Verified: Yes Hospitalized since last visit: No Secondary Verification Process Completed: Yes Pain Present Now: No Patient Has Alerts: Yes Patient Alerts: Type II Diabetic Aspirin 86m ABI L [place]>220 Electronic Signature(s) Unsigned Entered By: SRosalio Loudon 01/11/2022 11:54:52 -------------------------------------------------------------------------------- Clinic Level of Care Assessment Details Patient Name: Date of Service: CGar Ponto12/12/2021 11:30 A M Medical Record Number: [id number] Account Number: [id number] of Birth/Sex: Treating RN: 7Jul 07, 1967(56y.o. MSeward MethPrimary Care Jerimie Mancuso: SElisabeth CaraOther Clinician: Referring Yasamin Karel: Treating Raymond [name]/Extender: SHazle Cocain Treatment: 1 Clinic Level of Care Assessment Items TOOL 1 Quantity Score _0  - 0 Use when EandM and Procedure is performed on INITIAL visit ASSESSMENTS - Nursing Assessment / Reassessment _1  - 0 General Physical Exam (combine w/ comprehensive assessment (listed just below) when performed on new  pt. evals) _2  - 0 Comprehensive Assessment (HX, ROS, Risk Assessments, Wounds Hx, etc.) ASSESSMENTS - Wound and Skin Assessment / Reassessment [name], [name]([id number] Page 2 of 12 _3  - 0 Dermatologic / Skin Assessment (not related to wound area) ASSESSMENTS - Ostomy and/or Continence Assessment and Care _4  - 0 Incontinence Assessment and Management _5  - 0 Ostomy Care Assessment and Management (repouching, etc.) PROCESS - Coordination of Care _6  - 0 Simple Patient / Family Education for ongoing care _7  - 0 Complex (extensive) Patient / Family Education for ongoing care _8  - 0 Staff obtains CProgrammer, systems Records, T Results / Process Orders est _9  - 0 Staff telephones HHA, Nursing Homes / Clarify orders / etc _10  - 0 Routine Transfer to another Facility (non-emergent condition) _11  - 0 Routine Hospital Admission (non-emergent condition) _12  - 0 New Admissions / IBiomedical engineer/ Ordering NPWT Apligraf, etc. , _13  - 0 Emergency Hospital Admission (emergent condition) PROCESS - Special Needs _14  - 0 Pediatric / Minor Patient Management _15  - 0 Isolation Patient Management _16  - 0 Hearing / Language / Visual special needs _17  - 0 Assessment of Community assistance (transportation, D/C planning, etc.) _18  - 0 Additional assistance / Altered mentation _19  - 0 Support Surface(s) Assessment (bed, cushion, seat, etc.) INTERVENTIONS - Miscellaneous _20  - 0 External ear exam _21  - 0 Patient Transfer (multiple staff / HCivil Service fast streamer/ Similar devices) _22  - 0 Simple Staple / Suture removal (25 or less) _23  - 0 Complex Staple / Suture removal (26 or more) _24  - 0 Hypo/Hyperglycemic Management (do not check if billed separately) _25  - 0 Ankle / Brachial Index (ABI) - do not check if billed separately Has the patient been seen at the hospital within the last three years: Yes Total Score: 0 Level Of Care: ____ Electronic  Signature(s) Unsigned Entered By: SRosalio Loudon 01/11/2022 12:15:57 -------------------------------------------------------------------------------- Encounter Discharge Information Details Patient Name: Date of Service: Raymond [name]  D. 01/11/2022 11:30 A M Medical Record Number: [id number] Patient Account Number: [id number] Date of Birth/Sex: Treating RN: Nov 24, 1965 (56 y.o. Raymond [name] Primary Care Gerold Sar: Raymond [name] Other Clinician: Referring Raymond [name]: Treating Hallel Denherder/Extender: Raymond [name] [place] ([id number]) [id number] Page 3 of 12 Weeks in Treatment: 1 Encounter Discharge Information Items Post Procedure Vitals Discharge Condition: Stable Temperature (F): 98.2 Ambulatory Status: Ambulatory Pulse (bpm): 79 Discharge Destination: Home Respiratory Rate (breaths/min): 16 Transportation: Private Auto Blood Pressure (mmHg): 159/76 Accompanied By: self Schedule Follow-up Appointment: Yes Clinical Summary of Care: Electronic Signature(s) Unsigned Entered By: Rosalio Loud on 01/11/2022 12:20:12 -------------------------------------------------------------------------------- Lower Extremity Assessment Details Patient Name: Date of Service: Raymond [name] 01/11/2022 11:30 A M Medical Record Number: [id number] Patient Account Number: [id number] Date of Birth/Sex: Treating RN: [birth year] (56 y.o. Raymond [name] Primary Care Trevaris Pennella: Raymond [name] Other Clinician: Referring Waynesha Rammel: Treating Raechelle Sarti/Extender: Raymond [name] Weeks in Treatment: 1 Edema Assessment Assessed: [Left: No] Patrice Paradise: No] [Left: Edema] [Right: :] Calf Left: Right: Point of Measurement: From Medial Instep 50 cm Ankle Left: Right: Point of Measurement: From Medial Instep 31 cm Vascular Assessment Pulses: Dorsalis Pedis Palpable: [Left:Yes] Electronic Signature(s) Unsigned Entered By: Rosalio Loud on 01/11/2022 12:05:36 Signature(s): [name] ([id number]) [id number] Date(s): [id number] Page 4 of 12 -------------------------------------------------------------------------------- Multi Wound Chart Details Patient Name: Date of Service: Raymond [name]. 01/11/2022 11:30 A M Medical Record Number: [id number] Patient Account Number: [id number] Date of Birth/Sex: Treating RN: [birth year] (56 y.o. Raymond [name] Primary Care Afia Messenger: Raymond [name] Other Clinician: Referring Jakevious Hollister: Treating Mills Mitton/Extender: Raymond [name] Weeks in Treatment: 1 Vital Signs Height(in): 22 Pulse(bpm): 39 Weight(lbs): 371 Blood Pressure(mmHg): 159/76 Body Mass Index(BMI): 47.6 Temperature(F): 98.2 Respiratory Rate(breaths/min): 16 [2:Photos:] [N/A:N/A] Left, Medial Metatarsal head first Left T Third oe N/A Wound Location: Gradually Appeared Footwear Injury N/A Wounding Event: Diabetic Wound/Ulcer of the Lower Diabetic Wound/Ulcer of the Lower N/A Primary Etiology: Extremity Extremity Congestive Heart Failure, Congestive Heart Failure, N/A Comorbid History: Hypertension, Type II Diabetes, Hypertension, Type II Diabetes, Neuropathy Neuropathy 12/13/2021 01/03/2022 N/A Date Acquired: 1 1 N/A Weeks of Treatment: Open Open N/A Wound Status: No No N/A Wound Recurrence: 2x0.9x0.2 1.5x1.5x0.4 N/A Measurements L x W x D (cm) 1.414 1.767 N/A A (cm) : rea 0.283 0.707 N/A Volume (cm) : -11.20% -177.80% N/A % Reduction in A rea: 25.90% -456.70% N/A % Reduction in Volume: 12 Starting Position 1 (o'clock): 1 Ending Position 1 (o'clock): 1.5 Maximum Distance 1 (cm): Yes N/A N/A Undermining: Grade 2 Grade 1 N/A Classification: Medium Medium N/A Exudate A mount: Serous Serous N/A Exudate Type: amber amber N/A Exudate Color: Flat and Intact Flat and Intact N/A Wound Margin: None Present (0%) None Present (0%)  N/A Granulation A mount: Large (67-100%) Large (67-100%) N/A Necrotic A mount: Fat Layer (Subcutaneous Tissue): Yes Fat Layer (Subcutaneous Tissue): Yes N/A Exposed Structures: Fascia: No Fascia: No Tendon: No Tendon: No Muscle: No Muscle: No Joint: No Joint: No Bone: No Bone: No None None N/A Epithelialization: Treatment Notes Electronic Signature(s) Raymond [name], Raymond [name] ([id number]) [id number] Page 5 of 12 Unsigned Entered By: Rosalio Loud on 01/11/2022 12:09:39 -------------------------------------------------------------------------------- Multi-Disciplinary Care Plan Details Patient Name: Date of Service: Raymond [name] 01/11/2022 11:30 A M Medical Record Number: [id number] Patient Account Number: [id number] Date of Birth/Sex: Treating RN: [birth year] (56 y.o. Raymond [name] Primary Care Fiorela Pelzer: Raymond [name] Other Clinician: Referring Sukhdeep Wieting: Treating Ariq Khamis/Extender: Raymond [name] Weeks in Treatment:  1 Active Inactive Abuse / Safety / Falls / Self Care Management Nursing Diagnoses: Potential for falls Goals: Patient will remain injury free related to falls Date Initiated: 01/03/2022 Target Resolution Date: 01/03/2022 Goal Status: Active Interventions: Provide education on fall prevention Notes: Medication Nursing Diagnoses: Knowledge deficit related to medication safety: actual or potential Goals: Patient/caregiver will demonstrate understanding of all current medications Date Initiated: 01/03/2022 T arget Resolution Date: 01/03/2022 Goal Status: Active Interventions: Assess for medication contraindications each visit where new medications are prescribed Assess patient/caregiver ability to manage medication regimen upon admission and as needed Patient/Caregiver given reconciled medication list upon admission, changes in medications and discharge from the [place] Provide education on  medication safety Notes: Necrotic Tissue Nursing Diagnoses: Impaired tissue integrity related to necrotic/devitalized tissue Knowledge deficit related to management of necrotic/devitalized tissue Goals: Necrotic/devitalized tissue will be minimized in the wound bed Date Initiated: 01/03/2022 Target Resolution Date: 01/03/2022 Goal Status: Active Patient/caregiver will verbalize understanding of reason and process for debridement of necrotic tissue Date Initiated: 01/03/2022 Target Resolution Date: 01/03/2022 CARLEE, Raymond [name] ([id number]) [id number].pdf Page 6 of 12 Goal Status: Active Interventions: Assess patient pain level pre-, during and post procedure and prior to discharge Provide education on necrotic tissue and debridement process Treatment Activities: Excisional debridement : 01/03/2022 Notes: Nutrition Nursing Diagnoses: Impaired glucose control: actual or potential Goals: Patient/caregiver verbalizes understanding of need to maintain therapeutic glucose control per primary care physician Date Initiated: 01/03/2022 Target Resolution Date: 01/03/2022 Goal Status: Active Interventions: Provide education on elevated blood sugars and impact on wound healing Notes: Orientation to the Wound Care Program Nursing Diagnoses: Knowledge deficit related to the wound healing center program Goals: Patient/caregiver will verbalize understanding of the [place] [facility] Date Initiated: 01/03/2022 Target Resolution Date: 01/03/2022 Goal Status: Active Interventions: Provide education on orientation to the wound center Notes: Peripheral Neuropathy Nursing Diagnoses: Knowledge deficit related to disease process and management of peripheral neurovascular dysfunction Potential alteration in peripheral tissue perfusion (select prior to confirmation of diagnosis) Goals: Patient/caregiver will verbalize understanding of disease process and disease  management Date Initiated: 01/03/2022 Target Resolution Date: 01/03/2022 Goal Status: Active Interventions: Assess signs and symptoms of neuropathy upon admission and as needed Provide education on Management of Neuropathy and Related Ulcers Notes: Pressure Nursing Diagnoses: Knowledge deficit related to causes and risk factors for pressure ulcer development Knowledge deficit related to management of pressures ulcers Potential for impaired tissue integrity related to pressure, friction, moisture, and shear Goals: Patient will remain free from development of additional pressure ulcers Date Initiated: 01/03/2022 Target Resolution Date: 01/03/2022 Goal Status: Active Patient/caregiver will verbalize risk factors for pressure ulcer development Date Initiated: 01/03/2022 Target Resolution Date: 01/03/2022 Goal Status: Active Patient/caregiver will verbalize understanding of pressure ulcer management Date Initiated: 01/03/2022 Target Resolution Date: 01/03/2022 Goal Status: Active Interventions: Assess potential for pressure ulcer upon admission and as needed Raymond [name], Raymond [name] ([id number]) [id number] Page 7 of 12 Notes: Soft Tissue Infection Nursing Diagnoses: Impaired tissue integrity Knowledge deficit related to disease process and management Knowledge deficit related to home infection control: handwashing, handling of soiled dressings, supply storage Potential for infection: soft tissue Goals: Patient will remain free of wound infection Date Initiated: 01/03/2022 Target Resolution Date: 01/03/2022 Goal Status: Active Patient/caregiver will verbalize understanding of or measures to prevent infection and contamination in the home setting Date Initiated: 01/03/2022 Target Resolution Date: 01/03/2022 Goal Status: Active Patient's soft tissue infection will resolve Date Initiated: 01/03/2022 Target Resolution Date: 01/03/2022 Goal Status: Active Signs and  symptoms of infection will be recognized early to allow for prompt treatment Date Initiated: 01/03/2022 Target Resolution Date: 01/03/2022 Goal Status: Active Interventions: Assess signs and symptoms of infection every visit Treatment Activities: Culture and sensitivity : 01/03/2022 Notes: Wound/Skin Impairment Nursing Diagnoses: Impaired tissue integrity Knowledge deficit related to smoking impact on wound healing Knowledge deficit related to ulceration/compromised skin integrity Goals: Patient/caregiver will verbalize understanding of skin care regimen Date Initiated: 01/03/2022 Target Resolution Date: 01/03/2022 Goal Status: Active Ulcer/skin breakdown will have a volume reduction of 30% by week 4 Date Initiated: 01/03/2022 Target Resolution Date: 01/31/2022 Goal Status: Active Interventions: Assess patient/caregiver ability to obtain necessary supplies Assess patient/caregiver ability to perform ulcer/skin care regimen upon admission and as needed Assess ulceration(s) every visit Notes: Electronic Signature(s) Unsigned Entered By: Rosalio Loud on 01/11/2022 12:16:54 Pain Assessment Details -------------------------------------------------------------------------------- [name] ([id number]) [id number] Page 8 of 12 Patient Name: Date of Service: Raymond [name] 01/11/2022 11:30 A M Medical Record Number: [id number] Patient Account Number: [id number] Date of Birth/Sex: Treating RN: [birth year] (56 y.o. Raymond [name] Primary Care Jaylissa Felty: Raymond [name] Other Clinician: Referring Josyah Achor: Treating Arihaan Bellucci/Extender: Raymond [name] Weeks in Treatment: 1 Active Problems Location of Pain Severity and Description of Pain Patient Has Paino No Site Locations Pain Management and Medication Current Pain Management: Electronic Signature(s) Unsigned Entered By: Rosalio Loud on 01/11/2022  11:56:20 -------------------------------------------------------------------------------- Patient/Caregiver Education Details Patient Name: Date of Service: Raymond [name] 12/12/2023andnbsp11:30 [place] Record Number: [id number] Patient Account Number: [id number] Date of Birth/Gender: Treating RN: [birth year] (56 y.o. Raymond [name] Primary Care Physician: Raymond [name] Other Clinician: Referring Physician: Treating Physician/Extender: Raymond [name] in Treatment: 1 Education Assessment Education Provided To: Patient Education Topics Provided Wound Debridement: Handouts: Wound Debridement Methods: Explain/Verbal Responses: State content correctly [name] ([id number]) [id number] Page 9 of 12 Electronic Signature(s) Unsigned Entered By: Rosalio Loud on 01/11/2022 12:16:43 -------------------------------------------------------------------------------- Wound Assessment Details Patient Name: Date of Service: Raymond [name] 01/11/2022 11:30 A M Medical Record Number: [id number] Patient Account Number: [id number] Date of Birth/Sex: Treating RN: 03-28-1965 (56 y.o. Raymond [name] Primary Care Wilmore Holsomback: Raymond [name] Other Clinician: Referring Gabriella Guile: Treating Haron Beilke/Extender: Raymond [name] Weeks in Treatment: 1 Wound Status Wound Number: 2 Primary Diabetic Wound/Ulcer of the Lower Extremity Etiology: Wound Location: Left, Medial Metatarsal head first Wound Status: Open Wounding Event: Gradually Appeared Comorbid Congestive Heart Failure, Hypertension, Type II Diabetes, Date Acquired: 12/13/2021 History: Neuropathy Weeks Of Treatment: 1 Clustered Wound: No Photos Wound Measurements Length: (cm) 2 Width: (cm) 0.9 Depth: (cm) 0.2 Area: (cm) 1.414 Volume: (cm) 0.283 % Reduction in Area: -11.2% % Reduction in Volume: 25.9% Epithelialization: None Tunneling:  No Undermining: Yes Starting Position (o'clock): 12 Ending Position (o'clock): 1 Maximum Distance: (cm) 1.5 Wound Description Classification: Grade 2 Wound Margin: Flat and Intact Exudate Amount: Medium Exudate Type: Serous Exudate Color: amber Foul Odor After Cleansing: No Slough/Fibrino Yes Wound Bed Granulation Amount: None Present (0%) Exposed Structure Necrotic Amount: Large (67-100%) Fascia Exposed: No Necrotic Quality: Adherent Slough Fat Layer (Subcutaneous Tissue) Exposed: Yes JOMEL, WHITTLESEY ([id number]) [id number] Page 10 of 12 Tendon Exposed: No Muscle Exposed: No Joint Exposed: No Bone Exposed: No Treatment Notes Wound #2 (Metatarsal head first) Wound Laterality: Left, Medial Cleanser [place] Ancillary Kit - 15 Day Supply Discharge Instruction: Use supplies as instructed; Kit contains: (15) Saline Bullets; (15) 3x3 Gauze; 15 pr Gloves Soap and Water Discharge Instruction: Gently cleanse wound with  antibacterial soap, rinse and pat dry prior to dressing wounds Peri-Wound Care Topical Primary Dressing Iodosorb 40 (g) Discharge Instruction: Apply IodoSorb to wound bed only as directed. Secondary Dressing Gauze Discharge Instruction: As directed: dry, moistened with saline or moistened with Dakins Solution Secured With [place] Medipore H Soft Cloth Surgical T ape ape, 2x2 (in/yd) Compression Wrap Compression Stockings Add-Ons Electronic Signature(s) Unsigned Entered By: Rosalio Loud on 01/11/2022 12:04:17 -------------------------------------------------------------------------------- Wound Assessment Details Patient Name: Date of Service: Raymond [name] 01/11/2022 11:30 A M Medical Record Number: [id number] Patient Account Number: [id number] Date of Birth/Sex: Treating RN: 1966/01/18 (56 y.o. Raymond [name] Primary Care Shaniyah Wix: Raymond [name] Other Clinician: Referring Kalina Morabito: Treating  Mellony Danziger/Extender: Raymond [name] Weeks in Treatment: 1 Wound Status Wound Number: 3 Primary Diabetic Wound/Ulcer of the Lower Extremity Etiology: Wound Location: Left T Third oe Wound Status: Open Wounding Event: Footwear Injury Comorbid Congestive Heart Failure, Hypertension, Type II Diabetes, Date Acquired: 01/03/2022 History: Neuropathy Weeks Of Treatment: 1 Clustered Wound: No Photos ARMAS, MCBEE ([id number]) [id number] Page 11 of 12 Wound Measurements Length: (cm) 1.5 Width: (cm) 1.5 Depth: (cm) 0.4 Area: (cm) 1.767 Volume: (cm) 0.707 % Reduction in Area: -177.8% % Reduction in Volume: -456.7% Epithelialization: None Wound Description Classification: Grade 1 Wound Margin: Flat and Intact Exudate Amount: Medium Exudate Type: Serous Exudate Color: amber Foul Odor After Cleansing: No Slough/Fibrino Yes Wound Bed Granulation Amount: None Present (0%) Exposed Structure Necrotic Amount: Large (67-100%) Fascia Exposed: No Fat Layer (Subcutaneous Tissue) Exposed: Yes Tendon Exposed: No Muscle Exposed: No Joint Exposed: No Bone Exposed: No Treatment Notes Wound #3 (Toe Third) Wound Laterality: Left Cleanser Soap and Water Discharge Instruction: Gently cleanse wound with antibacterial soap, rinse and pat dry prior to dressing wounds Peri-Wound Care Topical Primary Dressing Iodosorb 40 (g) Discharge Instruction: Apply IodoSorb to wound bed only as directed. Secondary Dressing Gauze Discharge Instruction: As directed: dry, moistened with saline or moistened with Dakins Solution Secured With [place] H Soft Cloth Surgical T ape ape, 2x2 (in/yd) Compression Wrap Compression Stockings Add-Ons Electronic Signature(s) Unsigned Entered By: Rosalio Loud on 01/11/2022 12:04:46 Signature(s): [name] ([id number]) [id number]. Date(s): pdf Page 12 of  12 -------------------------------------------------------------------------------- Vitals Details Patient Name: Date of Service: Raymond [name]. 01/11/2022 11:30 A M Medical Record Number: [id number] Patient Account Number: [id number] Date of Birth/Sex: Treating RN: Jun 30, 1965 (56 y.o. Raymond [name] Primary Care Lacee Grey: Raymond [name] Other Clinician: Referring Mervin Ramires: Treating Andrae Claunch/Extender: Raymond [name] Weeks in Treatment: 1 Vital Signs Time Taken: 11:54 Temperature (F): 98.2 Height (in): 74 Pulse (bpm): 79 Weight (lbs): 371 Respiratory Rate (breaths/min): 16 Body Mass Index (BMI): 47.6 Blood Pressure (mmHg): 159/76 Reference Range: 80 - 120 mg / dl Electronic Signature(s) Unsigned Entered By: Rosalio Loud on 01/11/2022 11:55:54 Signature(s): Date(s):

## 2022-01-17 ENCOUNTER — Other Ambulatory Visit (INDEPENDENT_AMBULATORY_CARE_PROVIDER_SITE_OTHER): Payer: Self-pay | Admitting: Physician Assistant

## 2022-01-17 DIAGNOSIS — S81809S Unspecified open wound, unspecified lower leg, sequela: Secondary | ICD-10-CM

## 2022-01-18 ENCOUNTER — Encounter: Payer: Medicare HMO | Admitting: Physician Assistant

## 2022-01-18 DIAGNOSIS — E11621 Type 2 diabetes mellitus with foot ulcer: Secondary | ICD-10-CM | POA: Diagnosis not present

## 2022-01-18 NOTE — Progress Notes (Addendum)
GREGGORY, SAFRANEK ([id number]) [id number] Page 1 of 9 Visit Report for 01/18/2022 Chief Complaint Document Details Patient Name: Date of Service: Raymond [name]. 01/18/2022 11:30 A M Medical Record Number: [id number] Patient Account Number: [id number] Date of Birth/Sex: Treating RN: [birth year] (56 y.o. [name] Primary Care Provider: [name] Other Clinician: Referring Provider: Treating Provider/Extender: [name] Weeks in Treatment: 2 Information Obtained from: Patient Chief Complaint Left foot ulcer Electronic Signature(s) Signed: 01/18/2022 11:22:52 AM By: [name] PA-C Entered By: [name] on 01/18/2022 11:22:52 -------------------------------------------------------------------------------- Debridement Details Patient Name: Date of Service: [name] 01/18/2022 11:30 A M Medical Record Number: [id number] Patient Account Number: [id number] Date of Birth/Sex: Treating RN: July 15, 1965 (56 y.o. [name] Primary Care Provider: [name] Other Clinician: Referring Provider: Treating Provider/Extender: [name] Weeks in Treatment: 2 Debridement Performed for Assessment: Wound #3 Left T Third oe Performed By: Physician Tommie Sams., PA-C Debridement Type: Debridement Severity of Tissue Pre Debridement: Fat layer exposed Level of Consciousness (Pre-procedure): Awake and Alert Pre-procedure Verification/Time Out Yes - 11:55 Taken: Start Time: 11:55 T Area Debrided (L x W): otal 1.4 (cm) x 0.5 (cm) = 0.7 (cm) Tissue and other material debrided: Viable, Non-Viable, Slough, Subcutaneous, Slough Level: Skin/Subcutaneous Tissue Debridement Description: Excisional Instrument: Curette Bleeding: Minimum Hemostasis Achieved: Pressure Response to Treatment: Procedure was tolerated well Level of Consciousness (Post- Awake and  Alert procedure): JAYDON, AVINA ([id number]) [id number] Page 2 of 9 Post Debridement Measurements of Total Wound Length: (cm) 1.4 Width: (cm) 0.5 Depth: (cm) 0.2 Volume: (cm) 0.11 Character of Wound/Ulcer Post Debridement: Stable Severity of Tissue Post Debridement: Fat layer exposed Post Procedure Diagnosis Same as Pre-procedure Electronic Signature(s) Signed: 01/18/2022 4:11:06 PM By: [name] PA-C Signed: 01/18/2022 4:39:03 PM By: Rosalio Loud MSN RN CNS WTA Entered By: Rosalio Loud on 01/18/2022 11:57:19 -------------------------------------------------------------------------------- Debridement Details Patient Name: Date of Service: [name] 01/18/2022 11:30 A M Medical Record Number: [id number] Patient Account Number: [id number] Date of Birth/Sex: Treating RN: [birth year] (56 y.o. [name] Primary Care Provider: [name] Other Clinician: Referring Provider: Treating Provider/Extender: [name] Weeks in Treatment: 2 Debridement Performed for Assessment: Wound #2 Left,Medial Metatarsal head first Performed By: Physician Tommie Sams., PA-C Debridement Type: Debridement Severity of Tissue Pre Debridement: Fat layer exposed Level of Consciousness (Pre-procedure): Awake and Alert Pre-procedure Verification/Time Out Yes - 11:55 Taken: Start Time: 11:55 T Area Debrided (L x W): otal 1.4 (cm) x 0.5 (cm) = 0.7 (cm) Tissue and other material debrided: Viable, Non-Viable, Slough, Subcutaneous, Slough Level: Skin/Subcutaneous Tissue Debridement Description: Excisional Instrument: Curette Bleeding: Minimum Hemostasis Achieved: Silver Nitrate Response to Treatment: Procedure was tolerated well Level of Consciousness (Post- Awake and Alert procedure): Post Debridement Measurements of Total Wound Length: (cm) 1.4 Width: (cm) 0.5 Depth: (cm) 0.2 Volume: (cm) 0.11 Character of  Wound/Ulcer Post Debridement: Stable Severity of Tissue Post Debridement: Fat layer exposed Post Procedure Diagnosis Same as Pre-procedure Electronic Signature(s) Signed: 01/18/2022 4:11:06 PM By: [name] PA-C Signed: 01/18/2022 4:39:03 PM By: Rosalio Loud MSN RN CNS WTA Entered By: Rosalio Loud on 01/18/2022 12:05:01 [name] ([id number]) [id number] Page 3 of 9 -------------------------------------------------------------------------------- HPI Details Patient Name: Date of Service: Raymond [name]. 01/18/2022 11:30 A M Medical Record Number: [id number] Patient Account Number: [id number] Date of Birth/Sex: Treating RN: [birth year] (56 y.o. [name] Primary Care Provider: [name] Other  Clinician: Referring Provider: Treating Provider/Extender: Hazle Coca in Treatment: 2 History of Present Illness HPI Description: 09/05/17-He is seeing an initial evaluation for a left plantar foot ulcer. He has a remote history of left great toe amputation. He states that 4-6 weeks ago he noted callus formation and ulceration. He has not seen primary care regarding this. He is not currently on antibiotic therapy. He does not routinely follow with podiatry. He states diabetic foot wear will arrive early next week. The EHR shows an A1c of 9% approximate 4 months ago but he states he had one and primary care a few weeks ago but does not know the results. He is neuropathic and does not complain of any pain, he is currently wearing crocs. 09/12/17-he is here in follow up evaluation for left plantar foot ulcer. There is improvement in both appearance and measurement. We will continue with same treatment plan and he will follow up next week. 09/19/17 on evaluation today patient actually appears to be doing excellent in regard to his ulcer on the invitation site of his left great foot plantar aspect. He's been tolerating  the dressing changes without complication. In fact with the Prisma and the current measures he has been shown signs of excellent improvement week by week up to this point. We have been to breeding the wound in this seems to have been of great benefit for him. Fortunately there is no evidence of infection. 09/26/17 on evaluation today patient appears to be doing rather well in regard to his wound. He did not know quite as much improvement this week as compared to last week. Nonetheless he still continues to show signs of improving to some degree. I do believe he may benefit from an offloading shoe. No fevers, chills, nausea, or vomiting noted at this time. 10/03/17 on evaluation today patient actually appears to be doing much better in regard to the amputation site plantar foot ulcer. Overall this appears significantly smaller even compared to previous. He's been tolerating the dressing changes without complication. He did get his diabetic shoes and I did have a look at them they appear to be fairly good. Nonetheless I do think that for the time being I would probably recommend he continue with the offloading shoe that we have been utilizing just due to the fact that with the dressing I don't know that his diabetic shoes are going to work as appropriately as far as not called an additional pressure and irritation to the area in question. He understands. 10/10/17 on evaluation today patient actually appears to be doing very well in regard to his plantar foot ulcer. He has been tolerating the dressing changes without complication. I do feel like he's making signs of good improvement and in fact of the wound bed appears to be better although it may not be significantly changed in size it appears healthier and I do believe is showing signs of improving. Nonetheless we gonna keep working towards healing as far as that is concerned. There is no evidence of infection. 10/17/17 on evaluation today patient actually  appears to be doing poorly in regard to his plantar foot ulcer. Unfortunately other than just the area where the wound is itself there appears to be a blister that communicates with the wound unfortunately. This is more lateral to the wound itself and also to the distal point of the amputation site. There does not appear to be any evidence of cellulitis spreading at the foot but I do believe  some of the drainage from the site itself is. When in nature. Nonetheless this blister area I think needs to be removed in order to allow for appropriate healing of the ulcer itself I think that is gonna be difficult for it to heal otherwise. This was discussed with the patient today. 10/24/17 on evaluation today patient actually appears to be doing better compared to last week even post debridement. He has shown signs of improvement he did test positive for Staphylococcus aureus. With that being said he notes that he's not have any discomfort and in general he does feel like things seem to been doing better. Fortunately there's no additional blistering and no evidence of remaining infection at this time. No fevers, chills, nausea, or vomiting noted at this time. He has three days of antibiotic left. 10/31/17 on evaluation today patient actually appears to be doing very well in regard to his ulcer on the left foot. He has been tolerating the dressing changes without complication. With that being said fortunately there appears to be no evidence of infection I do think he's made progress compared to previous. No fevers, chills, nausea, or vomiting noted at this time. 11/14/17 on evaluation today patient actually appears to be doing excellent in regard to his amputation site ulcer on his foot. In fact this appears to be completely healed at this point. There is no evidence of infection nor underline abscess and I did thoroughly evaluate the periwound location. Overall I'm very pleased with how things  appear. Readmission: 01-03-2022 upon evaluation today patient appears for reevaluation here in the clinic although it has been since 2019 that I last saw him. This again has been over 4 years. With that being said he is having an issue with a wound on his foot unfortunately. He has not had any recent x-rays he tells me at this point which is unfortunate as avoid definitely can need to get something started that can be one of the primary items to get moving forward with. With that being said I also think were probably can obtain a wound culture he is probably going to require some antibiotics at some point. I just want to make sure that we have him on the right thing when we do this. Currently the patient tells me that his wounds have been present in regard to the met head for about a month at this point. With that being said he also has a wound on the third toe which is the next toe this actually still present. This unfortunately has a lot of callus buildup around it but I think it is larger underneath than what it would appear on initial inspection. Patient does have a history of diabetes mellitus type 2, congestive heart failure, and COPD. 01-11-2022 upon evaluation patient's wounds actually are showing signs of doing about the same may be just slightly cleaner but definitely not where we want things to be as of yet. Fortunately there does not appear to be any signs of active infection locally nor systemically at this point which is great news. No AIZIK, REH ([id number]) [id number] Page 4 of 9 fevers, chills, nausea, vomiting, or diarrhea. Of note patient has had his MRI but we do not have the results of that as of yet we are still waiting for the reading on this at this point. 01-18-2022 upon evaluation today patient presents after having had his MRI finally complete. 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 The third and fourth digits seem to be early which is good news. Nonetheless we are still going to need to try to see about getting the infection under control he is already been on the antibiotic therapy which I think has done quite well. In fact I feel like he is showing signs of improvement already. Electronic Signature(s) Signed: 01/18/2022 2:29:06 PM By: [name] PA-C Entered By: [name] on 01/18/2022 14:29:05 -------------------------------------------------------------------------------- Physical Exam Details Patient Name: Date of Service: [name] 01/18/2022 11:30 A M Medical Record Number: [id number] Patient Account Number: [id number] Date of Birth/Sex: Treating RN: 18-Jan-1966 (56 y.o. [name] Primary Care Provider: [name] Other Clinician: Referring Provider: Treating Provider/Extender: [name] Weeks in Treatment: 2 Constitutional Well-nourished and well-hydrated in no acute distress. Respiratory normal breathing without difficulty. Psychiatric this patient is able to make decisions and demonstrates good insight into disease process. Alert and Oriented x 3. pleasant and cooperative. Notes Upon inspection patient's wound bed actually showed signs of good granulation epithelization at this point. Fortunately I see no evidence of infection systemically which is great news so that something we will definitely need to keep a close eye on. Obviously locally I feel like he is doing pretty good as well here and I did go ahead and perform debridement clearway some of the necrotic debris tolerated that without complication postdebridement this appears to be doing much better. Electronic Signature(s) Signed: 01/18/2022 2:29:33 PM By: [name] PA-C Entered By: [name] on 01/18/2022 14:29:33 -------------------------------------------------------------------------------- Physician Orders  Details Patient Name: Date of Service: [name] 01/18/2022 11:30 A M Medical Record Number: [id number] Patient Account Number: [id number] Date of Birth/Sex: Treating RN: 1966-01-19 (56 y.o. 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 Wound #3 Left T Third oe Return Appointment in 1 week. Bathing/ [employer] wounds with antibacterial soap and water. May shower; gently cleanse wound with antibacterial soap, rinse and pat dry prior to dressing wounds Off-Loading Open toe surgical shoe Wound Treatment Wound #2 - Metatarsal head first Wound Laterality: Left, Medial Cleanser: [employer]ram Ancillary Kit - 15 Day Supply (Generic) 3 x Per Week/30 Days Discharge Instructions: Use supplies as instructed; Kit contains: (15) Saline Bullets; (15) 3x3 Gauze; 15 pr Gloves Cleanser: Soap and Water 3 x Per Week/30 Days Discharge Instructions: Gently cleanse wound with antibacterial soap, rinse and pat dry prior to dressing wounds Prim Dressing: Iodosorb 40 (g) (Dispense As Written) 3 x Per Week/30 Days ary Discharge Instructions: Apply IodoSorb to wound bed only as directed. Secondary Dressing: Gauze 3 x Per Week/30 Days Discharge Instructions: As directed: dry, moistened with saline or moistened with Dakins Solution Secured  With: Medipore T - 55M Medipore H Soft Cloth Surgical T ape ape, 2x2 (in/yd) 3 x Per Week/30 Days Wound #3 - T Third oe Wound Laterality: Left Cleanser: Soap and Water 3 x Per Week/30 Days Discharge Instructions: Gently cleanse  wound with antibacterial soap, rinse and pat dry prior to dressing wounds Prim Dressing: Iodosorb 40 (g) (Dispense As Written) 3 x Per Week/30 Days ary Discharge Instructions: Apply IodoSorb to wound bed only as directed. Secondary Dressing: Gauze 3 x Per Week/30 Days Discharge Instructions: As directed: dry, moistened with saline or moistened with Dakins Solution Secured With: Medipore T - 12M Medipore H Soft Cloth Surgical T ape ape, 2x2 (in/yd) 3 x Per Week/30 Days Electronic Signature(s) Signed: 01/18/2022 4:11:06 PM By: [name] PA-C Signed: 01/18/2022 4:39:03 PM By: Rosalio Loud MSN RN CNS WTA Entered By: Rosalio Loud on 01/18/2022 12:05:23 [name] ([id number]) [id number] Page 6 of 9 -------------------------------------------------------------------------------- Problem List Details Patient Name: Date of Service: Raymond [name]. 01/18/2022 11:30 A M Medical Record Number: [id number] Patient Account Number: [id number] Date of Birth/Sex: Treating RN: 1965-08-22 (56 y.o. [name] Primary Care Provider: [name] Other Clinician: Referring Provider: Treating Provider/Extender: [name] Weeks in Treatment: 2 Active Problems ICD-10 Encounter Code Description Active Date MDM Diagnosis E11.621 Type 2 diabetes mellitus with foot ulcer 01/03/2022 No Yes L97.522 Non-pressure chronic ulcer of other part of left foot with fat layer exposed 01/03/2022 No Yes I50.42 Chronic combined systolic (congestive) and diastolic (congestive) heart failure 01/03/2022 No Yes J44.9 Chronic obstructive pulmonary disease, unspecified 01/03/2022 No Yes Inactive Problems Resolved  Problems Electronic Signature(s) Signed: 01/18/2022 11:22:47 AM By: [name] PA-C Entered By: [name] on 01/18/2022 11:22:47 -------------------------------------------------------------------------------- Progress Note Details Patient Name: Date of Service: [name] 01/18/2022 11:30 A M Medical Record Number: [id number] Patient Account Number: [id number] Date of Birth/Sex: Treating RN: Sep 19, 1965 (56 y.o. [name] Primary Care Provider: [name] Other Clinician: Referring Provider: Treating Provider/Extender: [name] Weeks in Treatment: 2 Subjective Chief Complaint Information obtained from Patient Left foot ulcer History of Present Illness (HPI) 09/05/17-He is seeing an initial evaluation for a left plantar foot ulcer. He has a remote history of left great toe amputation. He states that 4-6 weeks ago he noted callus formation and ulceration. He has not seen primary care regarding this. He is not currently on antibiotic therapy. He does not routinely follow with podiatry. He states diabetic foot wear will arrive early next week. The EHR shows an A1c of 9% approximate 4 months ago but he states he had one and primary care a few weeks ago but does not know the results. He is neuropathic and does not complain of any pain, he is currently wearing crocs. 09/12/17-he is here in follow up evaluation for left plantar foot ulcer. There is improvement in both appearance and measurement. We will continue with same LABARON, DIGIROLAMO ([id number]) [id number] Page 7 of 9 treatment plan and he will follow up next week. 09/19/17 on evaluation today patient actually appears to be doing excellent in regard to his ulcer on the invitation site of his left great foot plantar aspect. He's been tolerating the dressing changes without complication. In fact with the Prisma and the current measures he has been shown signs  of excellent improvement week by week up to this point. We have been to breeding the wound in this seems to have been of great benefit for him. Fortunately there is no evidence of infection. 09/26/17 on evaluation today patient appears to be doing rather well in regard to his wound. He did not know quite as much improvement this week as compared to last week. Nonetheless he still  continues to show signs of improving to some degree. I do believe he may benefit from an offloading shoe. No fevers, chills, nausea, or vomiting noted at this time. 10/03/17 on evaluation today patient actually appears to be doing much better in regard to the amputation site plantar foot ulcer. Overall this appears significantly smaller even compared to previous. He's been tolerating the dressing changes without complication. He did get his diabetic shoes and I did have a look at them they appear to be fairly good. Nonetheless I do think that for the time being I would probably recommend he continue with the offloading shoe that we have been utilizing just due to the fact that with the dressing I don't know that his diabetic shoes are going to work as appropriately as far as not called an additional pressure and irritation to the area in question. He understands. 10/10/17 on evaluation today patient actually appears to be doing very well in regard to his plantar foot ulcer. He has been tolerating the dressing changes without complication. I do feel like he's making signs of good improvement and in fact of the wound bed appears to be better although it may not be significantly changed in size it appears healthier and I do believe is showing signs of improving. Nonetheless we gonna keep working towards healing as far as that is concerned. There is no evidence of infection. 10/17/17 on evaluation today patient actually appears to be doing poorly in regard to his plantar foot ulcer. Unfortunately other than just the area where  the wound is itself there appears to be a blister that communicates with the wound unfortunately. This is more lateral to the wound itself and also to the distal point of the amputation site. There does not appear to be any evidence of cellulitis spreading at the foot but I do believe some of the drainage from the site itself is. When in nature. Nonetheless this blister area I think needs to be removed in order to allow for appropriate healing of the ulcer itself I think that is gonna be difficult for it to heal otherwise. This was discussed with the patient today. 10/24/17 on evaluation today patient actually appears to be doing better compared to last week even post debridement. He has shown signs of improvement he did test positive for Staphylococcus aureus. With that being said he notes that he's not have any discomfort and in general he does feel like things seem to been doing better. Fortunately there's no additional blistering and no evidence of remaining infection at this time. No fevers, chills, nausea, or vomiting noted at this time. He has three days of antibiotic left. 10/31/17 on evaluation today patient actually appears to be doing very well in regard to his ulcer on the left foot. He has been tolerating the dressing changes without complication. With that being said fortunately there appears to be no evidence of infection I do think he's made progress compared to previous. No fevers, chills, nausea, or vomiting noted at this time. 11/14/17 on evaluation today patient actually appears to be doing excellent in regard to his amputation site ulcer on his foot. In fact this appears to be completely healed at this point. There is no evidence of infection nor underline abscess and I did thoroughly evaluate the periwound location. Overall I'm very pleased with how things appear. Readmission: 01-03-2022 upon evaluation today patient appears for reevaluation here in the clinic although it has been  since 2019 that I last saw him.  This again has been over 4 years. With that being said he is having an issue with a wound on his foot unfortunately. He has not had any recent x-rays he tells me at this point which is unfortunate as avoid definitely can need to get something started that can be one of the primary items to get moving forward with. With that being said I also think were probably can obtain a wound culture he is probably going to require some antibiotics at some point. I just want to make sure that we have him on the right thing when we do this. Currently the patient tells me that his wounds have been present in regard to the met head for about a month at this point. With that being said he also has a wound on the third toe which is the next toe this actually still present. This unfortunately has a lot of callus buildup around it but I think it is larger underneath than what it would appear on initial inspection. Patient does have a history of diabetes mellitus type 2, congestive heart failure, and COPD. 01-11-2022 upon evaluation patient's wounds actually are showing signs of doing about the same may be just slightly cleaner but definitely not where we want things to be as of yet. Fortunately there does not appear to be any signs of active infection locally nor systemically at this point which is great news. No fevers, chills, nausea, vomiting, or diarrhea. Of note patient has had his MRI but we do not have the results of that as of yet we are still waiting for the reading on this at this point. 01-18-2022 upon evaluation today patient presents after having had his MRI finally complete. 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 The third and fourth digits seem to be early which is good news. Nonetheless we are still going to need to try to see about getting the infection under control he is already been on the antibiotic  therapy which I think has done quite well. In fact I feel like he is showing signs of improvement already. Objective Constitutional Well-nourished and well-hydrated in no acute distress. Vitals Time Taken: 11:23 AM, Height: 74 in, Weight: 371 lbs, BMI: 47.6, Temperature: 98.1 F, Pulse: 75 bpm, Respiratory Rate: 16 breaths/min, Blood Pressure: 157/70 mmHg. Respiratory normal breathing without difficulty. Psychiatric this patient is able to make decisions and demonstrates good insight into disease process. Alert and Oriented x 3. pleasant and cooperative. General Notes: Upon inspection patient's wound bed actually showed signs of good granulation epithelization at this point. Fortunately I see no evidence of infection systemically which is great news so that something we will definitely need to keep a close eye on. Obviously locally I feel like he is doing pretty good as well here and I did go ahead and perform debridement clearway some of the necrotic debris tolerated that without complication postdebridement this appears WYNNE, JURY ([id number]) [id number] Page 8 of 9 to be doing much better. Integumentary (Hair, Skin) Wound #2 status is Open. Original cause of wound was Gradually Appeared. The date acquired was: 12/13/2021. The wound has been in treatment 2 weeks. The wound is located on the Left,Medial Metatarsal head first. The wound measures 1.4cm length x 0.5cm width x 0.1cm depth; 0.55cm^2 area and 0.055cm^3 volume. There is Fat Layer (Subcutaneous Tissue) exposed. There is no tunneling noted, however, there is undermining starting at 10:00 and ending  at 8:00 with a maximum distance of 0.7cm. There is a medium amount of serous drainage noted. The wound margin is flat and intact. There is no granulation within the wound bed. There is a large (67-100%) amount of necrotic tissue within the wound bed including Adherent Slough. Wound #3 status is Open.  Original cause of wound was Footwear Injury. The date acquired was: 01/03/2022. The wound has been in treatment 2 weeks. The wound is located on the Left T Third. The wound measures 1.4cm length x 0.5cm width x 0.1cm depth; 0.55cm^2 area and 0.055cm^3 volume. There is Fat oe Layer (Subcutaneous Tissue) exposed. There is no tunneling or undermining noted. There is a medium amount of serous drainage noted. The wound margin is flat and intact. There is no granulation within the wound bed. There is a large (67-100%) amount of necrotic tissue within the wound bed. 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 There was a Excisional Skin/Subcutaneous Tissue Debridement with a total area of 0.7 sq cm performed by Tommie Sams., PA-C. With the following instrument(s): Curette to remove Viable and Non-Viable tissue/material. Material removed includes Subcutaneous Tissue and Slough and. No specimens were taken. A time out was conducted at 11:55, prior to the start of the procedure. A Minimum amount of bleeding was controlled with Silver Nitrate. The procedure was tolerated well. Post Debridement Measurements: 1.4cm length x 0.5cm width x 0.2cm depth; 0.11cm^3 volume. Character of Wound/Ulcer Post Debridement is stable. Severity of Tissue Post Debridement is: Fat layer exposed. Post procedure Diagnosis Wound #2: Same as Pre-Procedure Wound #3 Pre-procedure diagnosis of Wound #3 is a Diabetic Wound/Ulcer of the Lower Extremity located on the Left T Third .Severity  of Tissue Pre Debridement is: oe Fat layer exposed. There was a Excisional Skin/Subcutaneous Tissue Debridement with a total area of 0.7 sq cm performed by Tommie Sams., PA-C. With the following instrument(s): Curette to remove Viable and Non-Viable tissue/material. Material removed includes Subcutaneous Tissue and Slough and. No specimens were taken. A time out was conducted at 11:55, prior to the start of the procedure. A Minimum amount of bleeding was controlled with Pressure. The procedure was tolerated well. Post Debridement Measurements: 1.4cm length x 0.5cm width x 0.2cm depth; 0.11cm^3 volume. Character of Wound/Ulcer Post Debridement is stable. Severity of Tissue Post Debridement is: Fat layer exposed. Post procedure Diagnosis Wound #3: Same as Pre-Procedure Plan Follow-up Appointments: Wound #2 Left,Medial Metatarsal head first: Return Appointment in 1 week. Wound #3 Left T Third: oe Return Appointment in 1 week. Bathing/ Shower/ Hygiene: Wash wounds with antibacterial soap and water. May shower; gently cleanse wound with antibacterial soap, rinse and pat dry prior to dressing wounds Off-Loading: Open toe surgical shoe WOUND #2: - Metatarsal head first Wound Laterality: Left, Medial Cleanser: [employer] Ancillary Kit - 15 Day Supply (Generic) 3 x Per Week/30 Days Discharge Instructions: Use supplies as instructed; Kit contains: (15) Saline Bullets; (15) 3x3 Gauze; 15 pr Gloves Cleanser: Soap and Water 3 x Per Week/30 Days Discharge Instructions: Gently cleanse wound with antibacterial soap, rinse and pat dry prior to dressing wounds Prim Dressing: Iodosorb 40 (g) (Dispense  As Written) 3 x Per Week/30 Days ary Discharge Instructions: Apply IodoSorb to wound bed only as directed. Secondary Dressing: Gauze 3 x Per Week/30 Days Discharge Instructions: As directed: dry, moistened with saline or moistened with Dakins Solution Secured With: Medipore T - 60M Medipore H Soft Cloth Surgical  T ape ape, 2x2 (in/yd) 3 x Per Week/30 Days WOUND #3: - T Third Wound Laterality: Left oe Cleanser: Soap and Water 3 x Per Week/30 Days Discharge Instructions: Gently cleanse wound with antibacterial soap, rinse and pat dry prior to dressing wounds Prim Dressing: Iodosorb 40 (g) (Dispense As Written) 3 x Per Week/30 Days ary Discharge Instructions: Apply IodoSorb to wound bed only as directed. Secondary Dressing: Gauze 3 x Per Week/30 Days Discharge Instructions: As directed: dry, moistened with saline or moistened with Dakins Solution Secured With: Medipore T - 60M Medipore H Soft Cloth Surgical T ape ape, 2x2 (in/yd) 3 x Per Week/30 Days KASSEM, KIBBE ([id number]) [id number] Page 9 of 9 1. I am going to suggest that we have the patient continue to monitor for any signs of infection or worsening at this point especially systemically. This would include fever, chills, nausea, vomiting, or diarrhea. If any of this occurs he should go to the ER but based on What I am seeing I am hopeful we will be able to get this movement in the right direction. 2. I am also going to recommend based on what we are seeing currently that we continue with Iodosorb. 3. Were also going to continue to cover this with gauze and secure with rolled gauze. 3 he should also continue with offloading shoe. We will see patient back for reevaluation in 1 week here in the clinic. If anything worsens or changes patient will contact our office for additional recommendations. Electronic Signature(s) Signed: 01/18/2022 2:30:28 PM By: [name] PA-C Entered By: [name] on 01/18/2022 14:30:28 -------------------------------------------------------------------------------- SuperBill Details Patient Name: Date of Service: [name], [name] 01/18/2022 Medical Record Number: [id number] Patient Account Number: [id number] Date of Birth/Sex: Treating RN: Aug 21, 1965 (56 y.o. [name] Primary Care Provider: [name] Other Clinician: Referring Provider: Treating Provider/Extender: [name] Weeks in Treatment: 2 Diagnosis Coding ICD-10 Codes Code Description [phone] Type 2 diabetes mellitus with foot ulcer L97.522 Non-pressure chronic ulcer of other part of left foot with fat layer exposed I50.42 Chronic combined systolic (congestive) and diastolic (congestive) heart failure J44.9 Chronic obstructive pulmonary disease, unspecified Facility Procedures : CPT4 Code: [id number] Description: 31594 - DEB SUBQ TISSUE 20 SQ CM/< ICD-10 Diagnosis Description L97.522 Non-pressure chronic ulcer of other part of left foot with fat layer exposed Modifier: Quantity: 1 Physician Procedures : CPT4 Code Description Modifier [id number] - WC PHYS SUBQ TISS 20 SQ CM ICD-10 Diagnosis Description L97.522 Non-pressure chronic ulcer of other part of left foot with fat layer exposed Quantity: 1 Electronic Signature(s) Signed: 01/18/2022 2:32:17 PM By: [name] PA-C Entered By: [name] on 01/18/2022 14:32:17

## 2022-01-18 NOTE — Progress Notes (Addendum)
Raymond [name] ([id number]) [id number].pdf Page 1 of 11 Visit Report for 01/18/2022 Arrival Information Details Patient Name: Date of Service: Raymond [name] 01/18/2022 11:30 A M Medical Record Number: [id number] Patient Account Number: [id number] Date of Birth/Sex: Treating RN: [birth year] (56 y.o. Raymond [name] Primary Care Raymond [name]: Raymond [name] Other Clinician: Referring Raymond [name]: Treating Raymond [name]/Extender: Raymond [name] in Treatment: 2 Visit Information History Since Last Visit Added or deleted any medications: No Patient Arrived: Ambulatory Any new allergies or adverse reactions: No Arrival Time: 11:20 Had a fall or experienced change in No Accompanied By: self activities of daily living that may affect Transfer Assistance: None risk of falls: Patient Identification Verified: Yes Hospitalized since last visit: No Secondary Verification Process Completed: Yes Pain Present Now: No Patient Has Alerts: Yes Patient Alerts: Type II Diabetic Aspirin 61m ABI L [place]>220 Electronic Signature(s) Signed: 01/18/2022 4:39:03 PM By: SRosalio LoudMSN RN CNS WTA Entered By: SRosalio Loudon 01/18/2022 11:23:18 -------------------------------------------------------------------------------- Clinic Level of Care Assessment Details Patient Name: Date of Service: CTessie EkeD. 01/18/2022 11:30 A M Medical Record Number: [id number] Account Number: [id number] of Birth/Sex: Treating RN: [birth year](56y.o. MSeward MethPrimary Care [name]: SElisabeth CaraOther Clinician: Referring [name]: Treating [name]/Extender: SHazle Cocain Treatment: 2 Clinic Level of Care Assessment Items TOOL 1 Quantity Score _0  - 0 Use when EandM and Procedure is performed on INITIAL visit ASSESSMENTS - Nursing Assessment / Reassessment _1  - 0 General Physical Exam (combine w/ comprehensive  assessment (listed just below) when performed on new pt. evals) _2  - 0 Comprehensive Assessment (HX, ROS, Risk Assessments, Wounds Hx, etc.) ASSESSMENTS - Wound and Skin Assessment / Reassessment _3  - 0 Dermatologic / Skin Assessment (not related to wound area) Raymond [name], Raymond [name]([id number] Page 2 of 11 ASSESSMENTS - Ostomy and/or Continence Assessment and Care _4  - 0 Incontinence Assessment and Management _5  - 0 Ostomy Care Assessment and Management (repouching, etc.) PROCESS - Coordination of Care _6  - 0 Simple Patient / Family Education for ongoing care _7  - 0 Complex (extensive) Patient / Family Education for ongoing care _8  - 0 Staff obtains CProgrammer, systems Records, T Results / Process Orders est _9  - 0 Staff telephones HHA, Nursing Homes / Clarify orders / etc _10  - 0 Routine Transfer to another Facility (non-emergent condition) _11  - 0 Routine Hospital Admission (non-emergent condition) _12  - 0 New Admissions / IBiomedical engineer/ Ordering NPWT Apligraf, etc. , _13  - 0 Emergency Hospital Admission (emergent condition) PROCESS - Special Needs _14  - 0 Pediatric / Minor Patient Management _15  - 0 Isolation Patient Management _16  - 0 Hearing / Language / Visual special needs _17  - 0 Assessment of Community assistance (transportation, D/C planning, etc.) _18  - 0 Additional assistance / Altered mentation _19  - 0 Support Surface(s) Assessment (bed, cushion, seat, etc.) INTERVENTIONS - Miscellaneous _20  - 0 External ear exam _21  - 0 Patient Transfer (multiple staff / HCivil Service fast streamer/ Similar devices) _22  - 0 Simple Staple / Suture removal (25 or less) _23  - 0 Complex Staple / Suture removal (26 or more) _24  - 0 Hypo/Hyperglycemic Management (do not check if billed separately) _25  - 0 Ankle / Brachial Index (ABI) - do not check if billed separately Has the patient been seen at the hospital within the last three years: Yes Total Score:  0 Level Of Care: ____ Electronic Signature(s) Signed: 01/18/2022 4:39:03 PM By: SRosalio LoudMSN RN CNS WTA Entered  By: Rosalio Loud on 01/18/2022 11:58:44 -------------------------------------------------------------------------------- Encounter Discharge Information Details Patient Name: Date of Service: Raymond [name] 01/18/2022 11:30 A M Medical Record Number: [id number] Patient Account Number: [id number] Date of Birth/Sex: Treating RN: 1965/11/22 (56 y.o. Raymond [name] Primary Care Gyselle Matthew: Raymond [name] Other Clinician: Referring [name]: Treating Kaylyne Axton/Extender: Raymond [name] in Treatment: 2 Encounter Discharge Information Items Post Procedure Vitals [name], [name] ([id number]) [id number] Page 3 of 11 Discharge Condition: Stable Temperature (F): 98.1 Ambulatory Status: Ambulatory Pulse (bpm): 75 Discharge Destination: Home Respiratory Rate (breaths/min): 16 Transportation: Private Auto Blood Pressure (mmHg): 157/70 Accompanied By: self Schedule Follow-up Appointment: Yes Clinical Summary of Care: Electronic Signature(s) Signed: 01/18/2022 4:39:03 PM By: Rosalio Loud MSN RN CNS WTA Entered By: Rosalio Loud on 01/18/2022 12:00:51 -------------------------------------------------------------------------------- Lower Extremity Assessment Details Patient Name: Date of Service: Raymond [name] D. 01/18/2022 11:30 A M Medical Record Number: [id number] Patient Account Number: [id number] Date of Birth/Sex: Treating RN: 04/10/1965 (56 y.o. Raymond [name] Primary Care Babe Anthis: Raymond [name] Other Clinician: Referring Dyami Umbach: Treating Blandon Offerdahl/Extender: [name] Weeks in Treatment: 2 Electronic Signature(s) Signed: 01/18/2022 4:39:03 PM By: Rosalio Loud MSN RN CNS WTA Entered By: Rosalio Loud on 01/18/2022  12:04:44 -------------------------------------------------------------------------------- Multi Wound Chart Details Patient Name: Date of Service: Raymond [name] D. 01/18/2022 11:30 A M Medical Record Number: [id number] Patient Account Number: [id number] Date of Birth/Sex: Treating RN: [birth year] (56 y.o. Raymond [name] Primary Care Koleson Reifsteck: Raymond [name] Other Clinician: Referring Eudell Julian: Treating Nijah Orlich/Extender: [name] Weeks in Treatment: 2 Vital Signs Height(in): 74 Pulse(bpm): 75 Weight(lbs): 371 Blood Pressure(mmHg): 157/70 Body Mass Index(BMI): 47.6 Temperature(F): 98.1 Respiratory Rate(breaths/min): 16 [2:Photos:] [N/A:N/A [id number] Page 4 of 11] Left, Medial Metatarsal head first Left T Third oe N/A Wound Location: Gradually Appeared Footwear Injury N/A Wounding Event: Diabetic Wound/Ulcer of the Lower Diabetic Wound/Ulcer of the Lower N/A Primary Etiology: Extremity Extremity Congestive Heart Failure, Congestive Heart Failure, N/A Comorbid History: Hypertension, Type II Diabetes, Hypertension, Type II Diabetes, Neuropathy Neuropathy 12/13/2021 01/03/2022 N/A Date Acquired: 2 2 N/A Weeks of Treatment: Open Open N/A Wound Status: No No N/A Wound Recurrence: 1.4x0.5x0.1 1.4x0.5x0.1 N/A Measurements L x W x D (cm) 0.55 0.55 N/A A (cm) : rea 0.055 0.055 N/A Volume (cm) : 56.80% 13.50% N/A % Reduction in A rea: 85.60% 56.70% N/A % Reduction in Volume: 10 Starting Position 1 (o'clock): 8 Ending Position 1 (o'clock): 0.7 Maximum Distance 1 (cm): Yes No N/A Undermining: Grade 2 Grade 1 N/A Classification: Medium Medium N/A Exudate A mount: Serous Serous N/A Exudate Type: amber amber N/A Exudate Color: Flat and Intact Flat and Intact N/A Wound Margin: None Present (0%) None Present (0%) N/A Granulation A mount: Large (67-100%) Large (67-100%) N/A Necrotic A mount: Fat Layer  (Subcutaneous Tissue): Yes Fat Layer (Subcutaneous Tissue): Yes N/A Exposed Structures: Fascia: No Fascia: No Tendon: No Tendon: No Muscle: No Muscle: No Joint: No Joint: No Bone: No Bone: No None None N/A Epithelialization: Treatment Notes Electronic Signature(s) Signed: 01/18/2022 4:39:03 PM By: Rosalio Loud MSN RN CNS WTA Entered By: Rosalio Loud on 01/18/2022 11:55:04 -------------------------------------------------------------------------------- Multi-Disciplinary Care Plan Details Patient Name: Date of Service: Raymond [name], Raymond Negus D. 01/18/2022 11:30 A M Medical Record Number: [id number] Patient Account Number: [id number] Date of Birth/Sex: Treating RN: [birth year] (56 y.o. Raymond [name] Primary Care Angelise Petrich: Raymond [name] Other Clinician: Referring Alveda Vanhorne: Treating Shaleigh Laubscher/Extender: [name] Weeks in Treatment: 2 Active Inactive Medication Nursing Diagnoses:  Knowledge deficit related to medication safety: actual or potential COLEMAN, KALAS ([id number]) [id number] Page 5 of 11 Goals: Patient/caregiver will demonstrate understanding of all current medications Date Initiated: 01/03/2022 T arget Resolution Date: 01/03/2022 Goal Status: Active Interventions: Assess for medication contraindications each visit where new medications are prescribed Assess patient/caregiver ability to manage medication regimen upon admission and as needed Patient/Caregiver given reconciled medication list upon admission, changes in medications and discharge from the [facility] Provide education on medication safety Notes: Necrotic Tissue Nursing Diagnoses: Impaired tissue integrity related to necrotic/devitalized tissue Knowledge deficit related to management of necrotic/devitalized tissue Goals: Necrotic/devitalized tissue will be minimized in the wound bed Date Initiated: 01/03/2022 Target Resolution Date: 01/03/2022 Goal  Status: Active Patient/caregiver will verbalize understanding of reason and process for debridement of necrotic tissue Date Initiated: 01/03/2022 Target Resolution Date: 01/03/2022 Goal Status: Active Interventions: Assess patient pain level pre-, during and post procedure and prior to discharge Provide education on necrotic tissue and debridement process Treatment Activities: Excisional debridement : 01/03/2022 Notes: Orientation to the Wound Care Program Nursing Diagnoses: Knowledge deficit related to the wound healing center program Goals: Patient/caregiver will verbalize understanding of the [facility] Date Initiated: 01/03/2022 Target Resolution Date: 01/03/2022 Goal Status: Active Interventions: Provide education on orientation to the wound center Notes: Peripheral Neuropathy Nursing Diagnoses: Knowledge deficit related to disease process and management of peripheral neurovascular dysfunction Potential alteration in peripheral tissue perfusion (select prior to confirmation of diagnosis) Goals: Patient/caregiver will verbalize understanding of disease process and disease management Date Initiated: 01/03/2022 Target Resolution Date: 01/03/2022 Goal Status: Active Interventions: Assess signs and symptoms of neuropathy upon admission and as needed Provide education on Management of Neuropathy and Related Ulcers Notes: Pressure Nursing Diagnoses: Knowledge deficit related to causes and risk factors for pressure ulcer development Knowledge deficit related to management of pressures ulcers Potential for impaired tissue integrity related to pressure, friction, moisture, and shear Goals: Patient will remain free from development of additional pressure ulcers ONIS, MARKOFF ([id number]) [id number] Page 6 of 11 Date Initiated: 01/03/2022 Target Resolution Date: 01/03/2022 Goal Status: Active Patient/caregiver will verbalize risk  factors for pressure ulcer development Date Initiated: 01/03/2022 Target Resolution Date: 01/03/2022 Goal Status: Active Patient/caregiver will verbalize understanding of pressure ulcer management Date Initiated: 01/03/2022 Target Resolution Date: 01/03/2022 Goal Status: Active Interventions: Assess potential for pressure ulcer upon admission and as needed Notes: Soft Tissue Infection Nursing Diagnoses: Impaired tissue integrity Knowledge deficit related to disease process and management Knowledge deficit related to home infection control: handwashing, handling of soiled dressings, supply storage Potential for infection: soft tissue Goals: Patient will remain free of wound infection Date Initiated: 01/03/2022 Target Resolution Date: 01/03/2022 Goal Status: Active Patient/caregiver will verbalize understanding of or measures to prevent infection and contamination in the home setting Date Initiated: 01/03/2022 Target Resolution Date: 01/03/2022 Goal Status: Active Patient's soft tissue infection will resolve Date Initiated: 01/03/2022 Target Resolution Date: 01/03/2022 Goal Status: Active Signs and symptoms of infection will be recognized early to allow for prompt treatment Date Initiated: 01/03/2022 Target Resolution Date: 01/03/2022 Goal Status: Active Interventions: Assess signs and symptoms of infection every visit Treatment Activities: Culture and sensitivity : 01/03/2022 Notes: Wound/Skin Impairment Nursing Diagnoses: Impaired tissue integrity Knowledge deficit related to smoking impact on wound healing Knowledge deficit related to ulceration/compromised skin integrity Goals: Patient/caregiver will verbalize understanding of skin care regimen Date Initiated: 01/03/2022 Target Resolution Date: 01/03/2022 Goal Status: Active Ulcer/skin breakdown will have a volume reduction of 30% by week 4  Date Initiated: 01/03/2022 Target Resolution Date: 01/31/2022 Goal Status:  Active Interventions: Assess patient/caregiver ability to obtain necessary supplies Assess patient/caregiver ability to perform ulcer/skin care regimen upon admission and as needed Assess ulceration(s) every visit Notes: Electronic Signature(s) Signed: 01/18/2022 4:39:03 PM By: Rosalio Loud MSN RN CNS WTA Entered By: Rosalio Loud on 01/18/2022 11:59:42 [name] ([id number]) [id number] Page 7 of 11 -------------------------------------------------------------------------------- Pain Assessment Details Patient Name: Date of Service: Raymond [name]. 01/18/2022 11:30 A M Medical Record Number: [id number] Patient Account Number: [id number] Date of Birth/Sex: Treating RN: Dec 29, 1965 (56 y.o. Raymond [name] Primary Care Nathon Stefanski: Raymond [name] Other Clinician: Referring Carston Riedl: Treating Saheed Carrington/Extender: [name] Weeks in Treatment: 2 Active Problems Location of Pain Severity and Description of Pain Patient Has Paino No Site Locations Pain Management and Medication Current Pain Management: Electronic Signature(s) Signed: 01/18/2022 4:39:03 PM By: Rosalio Loud MSN RN CNS WTA Entered By: Rosalio Loud on 01/18/2022 11:25:55 -------------------------------------------------------------------------------- Patient/Caregiver Education Details Patient Name: Date of Service: Raymond [name] 12/19/2023andnbsp11:30 A M Medical Record Number: [id number] Patient Account Number: [id number] Date of Birth/Gender: Treating RN: 1965/11/29 (56 y.o. Raymond [name] Primary Care Physician: Raymond [name] Other Clinician: Referring Physician: Treating Physician/Extender: Raymond [name] in Treatment: 2 JAIVIAN, BATTAGLINI ([id number]) [phone].pdf Page 8 of 11 Education Assessment Education Provided To: Patient Education Topics Provided CDC - Proper Use of  Antibiotics: Handouts: CDC antimicrobial patient education_English Methods: Explain/Verbal Responses: State content correctly Wound Debridement: Handouts: Wound Debridement Methods: Explain/Verbal Responses: State content correctly Electronic Signature(s) Signed: 01/18/2022 4:39:03 PM By: Rosalio Loud MSN RN CNS WTA Entered By: Rosalio Loud on 01/18/2022 11:59:15 -------------------------------------------------------------------------------- Wound Assessment Details Patient Name: Date of Service: Raymond [name] D. 01/18/2022 11:30 A M Medical Record Number: [id number] Patient Account Number: [id number] Date of Birth/Sex: Treating RN: [birth year] (56 y.o. Raymond [name] Primary Care Daliah Chaudoin: Raymond [name] Other Clinician: Referring Janiqua Friscia: Treating Jahmeir Geisen/Extender: [name] Weeks in Treatment: 2 Wound Status Wound Number: 2 Primary Diabetic Wound/Ulcer of the Lower Extremity Etiology: Wound Location: Left, Medial Metatarsal head first Wound Status: Open Wounding Event: Gradually Appeared Comorbid Congestive Heart Failure, Hypertension, Type II Diabetes, Date Acquired: 12/13/2021 History: Neuropathy Weeks Of Treatment: 2 Clustered Wound: No Photos Wound Measurements Length: (cm) 1 Width: (cm) 0 Depth: (cm) 0 Area: (cm) Volume: (cm) [name] ([id number]) .4 % Reduction in Area: 56.8% .5 % Reduction in Volume: 85.6% .1 Epithelialization: None 0.55 Tunneling: No 0.055 Undermining: Yes Starting Position (o'clock): 10 [id number] Page 9 of 11 Ending Position (o'clock): 8 Maximum Distance: (cm) 0.7 Wound Description Classification: Grade 2 Wound Margin: Flat and Intact Exudate Amount: Medium Exudate Type: Serous Exudate Color: amber Foul Odor After Cleansing: No Slough/Fibrino Yes Wound Bed Granulation Amount: None Present (0%) Exposed Structure Necrotic Amount: Large (67-100%) Fascia  Exposed: No Necrotic Quality: Adherent Slough Fat Layer (Subcutaneous Tissue) Exposed: Yes Tendon Exposed: No Muscle Exposed: No Joint Exposed: No Bone Exposed: No Treatment Notes Wound #2 (Metatarsal head first) Wound Laterality: Left, Medial Cleanser [place] Ancillary Kit - 15 Day Supply Discharge Instruction: Use supplies as instructed; Kit contains: (15) Saline Bullets; (15) 3x3 Gauze; 15 pr Gloves Soap and Water Discharge Instruction: Gently cleanse wound with antibacterial soap, rinse and pat dry prior to dressing wounds Peri-Wound Care Topical Primary Dressing Iodosorb 40 (g) Discharge Instruction: Apply IodoSorb to wound bed only as directed. Secondary Dressing Gauze Discharge Instruction: As directed: dry, moistened with saline or moistened with  Dakins Solution Secured With Medipore T - 38M Medipore H Soft Cloth Surgical T ape ape, 2x2 (in/yd) Compression Wrap Compression Stockings Add-Ons Electronic Signature(s) Signed: 01/18/2022 4:39:03 PM By: Rosalio Loud MSN RN CNS WTA Entered By: Rosalio Loud on 01/18/2022 12:04:35 -------------------------------------------------------------------------------- Wound Assessment Details Patient Name: Date of Service: Raymond [name] D. 01/18/2022 11:30 A M Medical Record Number: [id number] Patient Account Number: [id number] Date of Birth/Sex: Treating RN: [birth year] (56 y.o. Raymond [name] Primary Care Liann Spaeth: Raymond [name] Other Clinician: Referring Mustaf Antonacci: Treating Malissia Rabbani/Extender: [name] [place] ([id number]) [phone].pdf Page 10 of 11 Weeks in Treatment: 2 Wound Status Wound Number: 3 Primary Diabetic Wound/Ulcer of the Lower Extremity Etiology: Wound Location: Left T Third oe Wound Status: Open Wounding Event: Footwear Injury Comorbid Congestive Heart Failure, Hypertension, Type II Diabetes, Date Acquired: 01/03/2022 History: Neuropathy Weeks  Of Treatment: 2 Clustered Wound: No Photos Wound Measurements Length: (cm) 1.4 Width: (cm) 0.5 Depth: (cm) 0.1 Area: (cm) 0.55 Volume: (cm) 0.055 % Reduction in Area: 13.5% % Reduction in Volume: 56.7% Epithelialization: None Tunneling: No Undermining: No Wound Description Classification: Grade 1 Wound Margin: Flat and Intact Exudate Amount: Medium Exudate Type: Serous Exudate Color: amber Foul Odor After Cleansing: No Slough/Fibrino Yes Wound Bed Granulation Amount: None Present (0%) Exposed Structure Necrotic Amount: Large (67-100%) Fascia Exposed: No Fat Layer (Subcutaneous Tissue) Exposed: Yes Tendon Exposed: No Muscle Exposed: No Joint Exposed: No Bone Exposed: No Treatment Notes Wound #3 (Toe Third) Wound Laterality: Left Cleanser Soap and Water Discharge Instruction: Gently cleanse wound with antibacterial soap, rinse and pat dry prior to dressing wounds Peri-Wound Care Topical Primary Dressing Iodosorb 40 (g) Discharge Instruction: Apply IodoSorb to wound bed only as directed. Secondary Dressing Gauze Discharge Instruction: As directed: dry, moistened with saline or moistened with Dakins Solution Secured With Urich H Soft Cloth Surgical T ape ape, 2x2 (in/yd) Compression Wrap Compression Stockings Add-Ons EVRETT, HAKIM ([id number]) [id number].pdf Page 11 of 11 Electronic Signature(s) Signed: 01/18/2022 4:39:03 PM By: Rosalio Loud MSN RN CNS WTA Entered By: Rosalio Loud on 01/18/2022 11:34:18 -------------------------------------------------------------------------------- Vitals Details Patient Name: Date of Service: Raymond [name], Raymond Negus D. 01/18/2022 11:30 A M Medical Record Number: [id number] Patient Account Number: [id number] Date of Birth/Sex: Treating RN: 11/10/1965 (56 y.o. Raymond [name] Primary Care Omair Dettmer: Raymond [name] Other Clinician: Referring Sairah Knobloch: Treating  Ahlijah Raia/Extender: [name] Weeks in Treatment: 2 Vital Signs Time Taken: 11:23 Temperature (F): 98.1 Height (in): 74 Pulse (bpm): 75 Weight (lbs): 371 Respiratory Rate (breaths/min): 16 Body Mass Index (BMI): 47.6 Blood Pressure (mmHg): 157/70 Reference Range: 80 - 120 mg / dl Electronic Signature(s) Signed: 01/18/2022 4:39:03 PM By: Rosalio Loud MSN RN CNS WTA Entered By: Rosalio Loud on 01/18/2022 11:25:49

## 2022-01-19 ENCOUNTER — Encounter (INDEPENDENT_AMBULATORY_CARE_PROVIDER_SITE_OTHER): Payer: Medicare HMO

## 2022-01-19 ENCOUNTER — Ambulatory Visit (INDEPENDENT_AMBULATORY_CARE_PROVIDER_SITE_OTHER): Payer: Medicare HMO

## 2022-01-19 DIAGNOSIS — I739 Peripheral vascular disease, unspecified: Secondary | ICD-10-CM

## 2022-01-19 DIAGNOSIS — S81809S Unspecified open wound, unspecified lower leg, sequela: Secondary | ICD-10-CM

## 2022-01-27 ENCOUNTER — Encounter: Payer: Medicare HMO | Admitting: Internal Medicine

## 2022-01-27 DIAGNOSIS — E11621 Type 2 diabetes mellitus with foot ulcer: Secondary | ICD-10-CM | POA: Diagnosis not present

## 2022-01-27 NOTE — Progress Notes (Signed)
[place], PECHACEK ([id number]) [id number] Page 1 of 11 Visit Report for 01/27/2022 Arrival Information Details Patient Name: Date of Service: Raymond [name] 01/27/2022 11:30 A M Medical Record Number: [id number] Patient Account Number: [id number] Date of Birth/Sex: Treating RN: 1965/12/04 (56 y.o. Raymond [name] Primary Care Provider: Elisabeth [name] Other Clinician: Referring Provider: Treating Provider/Extender: Eldridge Dace, Raymond [name] Glenna [name] in Treatment: 3 Visit Information History Since Last Visit Added or deleted any medications: No Patient Arrived: Ambulatory Any new allergies or adverse reactions: No Arrival Time: 11:40 Had a fall or experienced change in No Accompanied By: self activities of daily living that may affect Transfer Assistance: None risk of falls: Patient Identification Verified: Yes Hospitalized since last visit: No Secondary Verification Process Completed: Yes Has Dressing in Place as Prescribed: Yes Patient Has Alerts: Yes Has Footwear/Offloading in Place as Yes Patient Alerts: Type II Diabetic Prescribed: Aspirin 85m Left: Surgical Shoe with Pressure Relief Insole ABI L [place]>220 Pain Present Now: No Electronic Signature(s) Signed: 01/27/2022 12:19:49 PM By: SRosalio LoudMSN RN CNS WTA Entered By: SRosalio Loudon 01/27/2022 12:19:48 -------------------------------------------------------------------------------- Clinic Level of Care Assessment Details Patient Name: Date of Service: Raymond EkeD. 01/27/2022 11:30 A M Medical Record Number: [id number] Account Number: [id number] of Birth/Sex: Treating RN: 722-Aug-1967(56y.o. MSeward MethPrimary Care Provider: SElisabeth CaraOther Clinician: Referring Provider: Treating Provider/Extender: Raymond [name], Raymond [name] Raymond Fellowsin Treatment: 3 Clinic Level of Care Assessment Items TOOL 4 Quantity Score X- 1  0 Use when only an EandM is performed on FOLLOW-UP visit ASSESSMENTS - Nursing Assessment / Reassessment X- 1 10 Reassessment of Co-morbidities (includes updates in patient status) X- 1 5 Reassessment of Adherence to Treatment Plan ASSESSMENTS - Wound and Skin Assessment / Reassessment CBRADFORD, [name]([id number] Page 2 of 11 [] - 0 Simple Wound Assessment / Reassessment - one wound X- 2 5 Complex Wound Assessment / Reassessment - multiple wounds [] - 0 Dermatologic / Skin Assessment (not related to wound area) ASSESSMENTS - Focused Assessment [] - 0 Circumferential Edema Measurements - multi extremities [] - 0 Nutritional Assessment / Counseling / Intervention [] - 0 Lower Extremity Assessment (monofilament, tuning fork, pulses) [] - 0 Peripheral Arterial Disease Assessment (using hand held doppler) ASSESSMENTS - Ostomy and/or Continence Assessment and Care [] - 0 Incontinence Assessment and Management [] - 0 Ostomy Care Assessment and Management (repouching, etc.) PROCESS - Coordination of Care [] - 0 Simple Patient / Family Education for ongoing care X- 1 20 Complex (extensive) Patient / Family Education for ongoing care X- 1 10 Staff obtains CProgrammer, systems Records, T Results / Process Orders est [] - 0 Staff telephones HHA, Nursing Homes / Clarify orders / etc [] - 0 Routine Transfer to another Facility (non-emergent condition) [] - 0 Routine Hospital Admission (non-emergent condition) [] - 0 New Admissions / IBiomedical engineer/ Ordering NPWT Apligraf, etc. , [] - 0 Emergency Hospital Admission (emergent condition) [] - 0 Simple Discharge Coordination X- 1 15 Complex (extensive) Discharge Coordination PROCESS - Special Needs [] - 0 Pediatric / Minor Patient Management [] - 0 Isolation Patient Management [] - 0 Hearing / Language / Visual special needs [] - 0 Assessment of Community assistance  (transportation, D/C planning, etc.) [] - 0 Additional assistance / Altered mentation [] - 0 Support Surface(s) Assessment (bed, cushion, seat, etc.) INTERVENTIONS - Wound Cleansing / Measurement [] - 0  Simple Wound Cleansing - one wound X- 2 5 Complex Wound Cleansing - multiple wounds X- 1 5 Wound Imaging (photographs - any number of wounds) [] - 0 Wound Tracing (instead of photographs) [] - 0 Simple Wound Measurement - one wound X- 2 5 Complex Wound Measurement - multiple wounds INTERVENTIONS - Wound Dressings X - Small Wound Dressing one or multiple wounds 2 10 [] - 0 Medium Wound Dressing one or multiple wounds [] - 0 Large Wound Dressing one or multiple wounds [] - 0 Application of Medications - topical [] - 0 Application of Medications - injection INTERVENTIONS - Miscellaneous [] - 0 External ear exam [] - 0 Specimen Collection (cultures, biopsies, blood, body fluids, etc.) Raymond [name], Raymond [name] ([id number]) [id number] Page 3 of 11 [] - 0 Specimen(s) / Culture(s) sent or taken to Lab for analysis [] - 0 Patient Transfer (multiple staff / Harrel Lemon Lift / Similar devices) [] - 0 Simple Staple / Suture removal (25 or less) [] - 0 Complex Staple / Suture removal (26 or more) [] - 0 Hypo / Hyperglycemic Management (close monitor of Blood Glucose) [] - 0 Ankle / Brachial Index (ABI) - do not check if billed separately X- 1 5 Vital Signs Has the patient been seen at the hospital within the last three years: Yes Total Score: 120 Level Of Care: New/Established - Level 4 Electronic Signature(s) Signed: 01/27/2022 4:56:13 PM By: Rosalio Loud MSN RN CNS WTA Entered By: Rosalio Loud on 01/27/2022 12:22:07 -------------------------------------------------------------------------------- Encounter Discharge Information Details Patient Name: Date of Service: Raymond Eke D. 01/27/2022 11:30 A M Medical Record Number: [id number] Patient  Account Number: [id number] Date of Birth/Sex: Treating RN: 05-19-1965 (56 y.o. Raymond [name] Primary Care Raymond [name]: Raymond [name] Other Clinician: Referring Raymond [name]: Treating Raymond [name]: Raymond BSO Raymond [name], Raymond [name] Glenna [name] in Treatment: 3 Encounter Discharge Information Items Discharge Condition: Stable Ambulatory Status: Ambulatory Discharge Destination: Home Transportation: Private Auto Accompanied By: self Schedule Follow-up Appointment: Yes Clinical Summary of Care: Electronic Signature(s) Signed: 01/27/2022 12:23:47 PM By: Rosalio Loud MSN RN CNS WTA Entered By: Rosalio Loud on 01/27/2022 12:23:47 -------------------------------------------------------------------------------- Lower Extremity Assessment Details Patient Name: Date of Service: Raymond Eke D. 01/27/2022 11:30 A M Medical Record Number: [id number] Patient Account Number: [id number] Raymond [name], Raymond [name] ([id number]) [id number] Page 4 of 11 Date of Birth/Sex: Treating RN: 12-05-1965 (56 y.o. Raymond [name] Primary Care Ailin Rochford: Raymond [name] Other Clinician: Referring Jehiel Koepp: Treating Haddon Fyfe/Extender: Raymond BSO Raymond [name], Raymond [name] Glenna [name] in Treatment: 3 Electronic Signature(s) Signed: 01/27/2022 12:20:07 PM By: Rosalio Loud MSN RN CNS WTA Entered By: Rosalio Loud on 01/27/2022 12:20:06 -------------------------------------------------------------------------------- Multi Wound Chart Details Patient Name: Date of Service: Raymond Eke D. 01/27/2022 11:30 A M Medical Record Number: [id number] Patient Account Number: [id number] Date of Birth/Sex: Treating RN: 17-Jun-1965 (56 y.o. Raymond [name] Primary Care Raymond [name]: Raymond [name] Other Clinician: Referring Savannaha Stonerock: Treating Gwenetta Devos/Extender: Raymond [name], Raymond [name] Glenna [name] in Treatment: 3 Vital Signs Height(in): 69 Pulse(bpm): 33 Weight(lbs): 371 Blood  Pressure(mmHg): 157/81 Body Mass Index(BMI): 47.6 Temperature(F): 98.4 Respiratory Rate(breaths/min): 18 [2:Photos:] [[name]/A:[name]/A] Left, Medial Metatarsal head first Left T Third oe [name]/A Wound Location: Gradually Appeared Footwear Injury [name]/A Wounding Event: Diabetic Wound/Ulcer of the Lower Diabetic Wound/Ulcer of the Lower [name]/A Primary Etiology: Extremity Extremity Congestive Heart Failure, Congestive Heart Failure, [name]/A Comorbid History: Hypertension, Type II Diabetes, Hypertension, Type II Diabetes, Neuropathy Neuropathy 12/13/2021 01/03/2022 [name]/A Date Acquired: 3 3 [name]/A  Weeks of Treatment: Open Open [name]/A Wound Status: No No [name]/A Wound Recurrence: 1.1x0.5x0.1 0.7x0.7x0.1 [name]/A Measurements L x W x D (cm) 0.432 0.385 [name]/A A (cm) : rea 0.043 0.038 [name]/A Volume (cm) : 66.00% 39.50% [name]/A % Reduction in A rea: 88.70% 70.10% [name]/A % Reduction in Volume: 11 Starting Position 1 (o'clock): 12 Ending Position 1 (o'clock): 0.4 Maximum Distance 1 (cm): Yes [name]/A [name]/A Undermining: Grade 2 Grade 1 [name]/A Classification: Medium Medium [name]/A Exudate A mount: Serous Serous [name]/A Exudate Type: amber amber [name]/A Exudate Color: Flat and Intact Flat and Intact [name]/A Wound Margin: None Present (0%) None Present (0%) [name]/A Granulation A mount: Large (67-100%) Large (67-100%) [name]/A Necrotic A mountTYRRELL, STEPHENS ([id number]) [id number] Page 5 of 11 Fat Layer (Subcutaneous Tissue): Yes Fat Layer (Subcutaneous Tissue): Yes [name]/A Exposed Structures: Fascia: No Fascia: No Tendon: No Tendon: No Muscle: No Muscle: No Joint: No Joint: No Bone: No Bone: No None None [name]/A Epithelialization: Treatment Notes Electronic Signature(s) Signed: 01/27/2022 12:20:13 PM By: Rosalio Loud MSN RN CNS WTA Entered By: Rosalio Loud on 01/27/2022 12:20:13 -------------------------------------------------------------------------------- Multi-Disciplinary Care Plan Details Patient Name:  Date of Service: Raymond [name], Raymond Negus D. 01/27/2022 11:30 A M Medical Record Number: [id number] Patient Account Number: [id number] Date of Birth/Sex: Treating RN: Dec 12, 1965 (56 y.o. Raymond [name] Primary Care Provider: Elisabeth [name] Other Clinician: Referring Provider: Treating Provider/Extender: Eldridge Dace, Raymond [name] Glenna [name] in Treatment: 3 Active Inactive Medication Nursing Diagnoses: Knowledge deficit related to medication safety: actual or potential Goals: Patient/caregiver will demonstrate understanding of all current medications Date Initiated: 01/03/2022 T arget Resolution Date: 01/03/2022 Goal Status: Active Interventions: Assess for medication contraindications each visit where new medications are prescribed Assess patient/caregiver ability to manage medication regimen upon admission and as needed Patient/Caregiver given reconciled medication list upon admission, changes in medications and discharge from the [place] Provide education on medication safety Notes: Necrotic Tissue Nursing Diagnoses: Impaired tissue integrity related to necrotic/devitalized tissue Knowledge deficit related to management of necrotic/devitalized tissue Goals: Necrotic/devitalized tissue will be minimized in the wound bed Date Initiated: 01/03/2022 Target Resolution Date: 01/03/2022 Goal Status: Active Patient/caregiver will verbalize understanding of reason and process for debridement of necrotic tissue Date Initiated: 01/03/2022 Target Resolution Date: 01/03/2022 Goal Status: Active Interventions: Assess patient pain level pre-, during and post procedure and prior to discharge Provide education on necrotic tissue and debridement process Raymond [name], Raymond [name] ([id number]) [phone] Page 6 of 11 Treatment Activities: Excisional debridement : 01/03/2022 Notes: Pressure Nursing Diagnoses: Knowledge deficit related to causes and risk factors for  pressure ulcer development Knowledge deficit related to management of pressures ulcers Potential for impaired tissue integrity related to pressure, friction, moisture, and shear Goals: Patient will remain free from development of additional pressure ulcers Date Initiated: 01/03/2022 Target Resolution Date: 01/03/2022 Goal Status: Active Patient/caregiver will verbalize risk factors for pressure ulcer development Date Initiated: 01/03/2022 Target Resolution Date: 01/03/2022 Goal Status: Active Patient/caregiver will verbalize understanding of pressure ulcer management Date Initiated: 01/03/2022 Target Resolution Date: 01/03/2022 Goal Status: Active Interventions: Assess potential for pressure ulcer upon admission and as needed Notes: Soft Tissue Infection Nursing Diagnoses: Impaired tissue integrity Knowledge deficit related to disease process and management Knowledge deficit related to home infection control: handwashing, handling of soiled dressings, supply storage Potential for infection: soft tissue Goals: Patient will remain free of wound infection Date Initiated: 01/03/2022 Target Resolution Date: 01/03/2022 Goal Status: Active Patient/caregiver will verbalize understanding of or measures to prevent infection and contamination  in the home setting Date Initiated: 01/03/2022 Target Resolution Date: 01/03/2022 Goal Status: Active Patient's soft tissue infection will resolve Date Initiated: 01/03/2022 Target Resolution Date: 01/03/2022 Goal Status: Active Signs and symptoms of infection will be recognized early to allow for prompt treatment Date Initiated: 01/03/2022 Target Resolution Date: 01/03/2022 Goal Status: Active Interventions: Assess signs and symptoms of infection every visit Treatment Activities: Culture and sensitivity : 01/03/2022 Notes: Wound/Skin Impairment Nursing Diagnoses: Impaired tissue integrity Knowledge deficit related to smoking impact on wound  healing Knowledge deficit related to ulceration/compromised skin integrity Goals: Patient/caregiver will verbalize understanding of skin care regimen Date Initiated: 01/03/2022 Target Resolution Date: 01/03/2022 Goal Status: Active Ulcer/skin breakdown will have a volume reduction of 30% by week 4 Date Initiated: 01/03/2022 Target Resolution Date: 01/31/2022 Goal Status: Active Interventions: Assess patient/caregiver ability to obtain necessary supplies Assess patient/caregiver ability to perform ulcer/skin care regimen upon admission and as needed Raymond [name], Raymond [name] ([id number]) [id number] Page 7 of 11 Assess ulceration(s) every visit Notes: Electronic Signature(s) Signed: 01/27/2022 12:23:04 PM By: Rosalio Loud MSN RN CNS WTA Entered By: Rosalio Loud on 01/27/2022 12:23:04 -------------------------------------------------------------------------------- Pain Assessment Details Patient Name: Date of Service: Raymond Eke D. 01/27/2022 11:30 A M Medical Record Number: [id number] Patient Account Number: [id number] Date of Birth/Sex: Treating RN: 19-Jun-1965 (56 y.o. Raymond [name] Primary Care Getsemani Lindon: Raymond [name] Other Clinician: Referring Kalden Wanke: Treating Rylynn Kobs/Extender: Eldridge Dace, [place] [name] Glenna [name] in Treatment: 3 Active Problems Location of Pain Severity and Description of Pain Patient Has Paino No Site Locations Pain Management and Medication Current Pain Management: Electronic Signature(s) Signed: 01/27/2022 12:19:58 PM By: Rosalio Loud MSN RN CNS WTA Entered By: Rosalio Loud on 01/27/2022 12:19:58 Raymond [name] ([id number]) [id number] Page 8 of 11 -------------------------------------------------------------------------------- Patient/Caregiver Education Details Patient Name: Date of Service: Raymond [name] 12/28/2023andnbsp11:30 A M Medical Record Number:  [id number] Patient Account Number: [id number] Date of Birth/Gender: Treating RN: 02-Jan-1966 (56 y.o. Raymond [name] Primary Care Physician: Raymond [name] Other Clinician: Referring Physician: Treating Physician/Extender: Raymond BSO Raymond [name], Harris [name] Glenna [name] in Treatment: 3 Education Assessment Education Provided To: Patient Education Topics Provided Offloading: Handouts: How Offloading Helps Foot Wounds Heal Methods: Explain/Verbal Responses: State content correctly Wound/Skin Impairment: Handouts: Caring for Your Ulcer Methods: Explain/Verbal Responses: State content correctly Electronic Signature(s) Signed: 01/27/2022 4:56:13 PM By: Rosalio Loud MSN RN CNS WTA Entered By: Rosalio Loud on 01/27/2022 12:22:36 -------------------------------------------------------------------------------- Wound Assessment Details Patient Name: Date of Service: Raymond Eke D. 01/27/2022 11:30 A M Medical Record Number: [id number] Patient Account Number: [id number] Date of Birth/Sex: Treating RN: September 23, 1965 (56 y.o. Raymond [name] Primary Care Fender Herder: Raymond [name] Other Clinician: Referring Tevis Dunavan: Treating Raymond [name]/Extender: Raymond [name], Raymond [name] Baird Kay Weeks in Treatment: 3 Wound Status Wound Number: 2 Primary Diabetic Wound/Ulcer of the Lower Extremity Etiology: Wound Location: Left, Medial Metatarsal head first Wound Status: Open Wounding Event: Gradually Appeared Comorbid Congestive Heart Failure, Hypertension, Type II Diabetes, Date Acquired: 12/13/2021 History: Neuropathy Weeks Of Treatment: 3 Clustered Wound: No Raymond [name] ([id number]) [id number] Page 9 of 11 Photos Wound Measurements Length: (cm) 1.1 Width: (cm) 0.5 Depth: (cm) 0.1 Area: (cm) 0.432 Volume: (cm) 0.043 % Reduction in Area: 66% % Reduction in Volume: 88.7% Epithelialization: None Tunneling: No Undermining: Yes Starting Position  (o'clock): 11 Ending Position (o'clock): 12 Maximum Distance: (cm) 0.4 Wound Description Classification: Grade 2 Wound Margin: Flat and Intact Exudate Amount: Medium Exudate Type: Serous Exudate Color:  amber Foul Odor After Cleansing: No Slough/Fibrino Yes Wound Bed Granulation Amount: None Present (0%) Exposed Structure Necrotic Amount: Large (67-100%) Fascia Exposed: No Necrotic Quality: Adherent Slough Fat Layer (Subcutaneous Tissue) Exposed: Yes Tendon Exposed: No Muscle Exposed: No Joint Exposed: No Bone Exposed: No Treatment Notes Wound #2 (Metatarsal head first) Wound Laterality: Left, Medial Cleanser [place] Ancillary Kit - 15 Day Supply Discharge Instruction: Use supplies as instructed; Kit contains: (15) Saline Bullets; (15) 3x3 Gauze; 15 pr Gloves Soap and Water Discharge Instruction: Gently cleanse wound with antibacterial soap, rinse and pat dry prior to dressing wounds Peri-Wound Care Topical Primary Dressing Iodosorb 40 (g) Discharge Instruction: Apply IodoSorb to wound bed only as directed. Secondary Dressing Gauze Discharge Instruction: As directed: dry, moistened with saline or moistened with Dakins Solution Secured With [place] H Soft Cloth Surgical T ape ape, 2x2 (in/yd) Compression Wrap Compression Stockings Add-Ons Electronic Signature(s) Signed: 01/27/2022 4:56:13 PM By: Rosalio Loud MSN RN CNS [address] Gwyndolyn Saxon D ([id number]) [id number] Page 10 of 11 Entered By: Rosalio Loud on 01/27/2022 11:53:10 -------------------------------------------------------------------------------- Wound Assessment Details Patient Name: Date of Service: Raymond [name] 01/27/2022 11:30 A M Medical Record Number: [id number] Patient Account Number: [id number] Date of Birth/Sex: Treating RN: February 09, 1965 (56 y.o. Raymond [name] Primary Care Provider: Elisabeth [name] Other Clinician: Referring  Provider: Treating Provider/Extender: Raymond [name], Raymond [name] Baird Kay Weeks in Treatment: 3 Wound Status Wound Number: 3 Primary Diabetic Wound/Ulcer of the Lower Extremity Etiology: Wound Location: Left T Third oe Wound Status: Open Wounding Event: Footwear Injury Comorbid Congestive Heart Failure, Hypertension, Type II Diabetes, Date Acquired: 01/03/2022 History: Neuropathy Weeks Of Treatment: 3 Clustered Wound: No Photos Wound Measurements Length: (cm) 0.7 Width: (cm) 0.7 Depth: (cm) 0.1 Area: (cm) 0.385 Volume: (cm) 0.038 % Reduction in Area: 39.5% % Reduction in Volume: 70.1% Epithelialization: None Wound Description Classification: Grade 1 Wound Margin: Flat and Intact Exudate Amount: Medium Exudate Type: Serous Exudate Color: amber Foul Odor After Cleansing: No Slough/Fibrino Yes Wound Bed Granulation Amount: None Present (0%) Exposed Structure Necrotic Amount: Large (67-100%) Fascia Exposed: No Fat Layer (Subcutaneous Tissue) Exposed: Yes Tendon Exposed: No Muscle Exposed: No Joint Exposed: No Bone Exposed: No Treatment Notes Wound #3 (Toe Third) Wound Laterality: Left AXL, RODINO ([id number]) [id number] Page 11 of 11 Cleanser Soap and Water Discharge Instruction: Gently cleanse wound with antibacterial soap, rinse and pat dry prior to dressing wounds Peri-Wound Care Topical Primary Dressing Iodosorb 40 (g) Discharge Instruction: Apply IodoSorb to wound bed only as directed. Secondary Dressing Gauze Discharge Instruction: As directed: dry, moistened with saline or moistened with Dakins Solution Secured With [place] H Soft Cloth Surgical T ape ape, 2x2 (in/yd) Compression Wrap Compression Stockings Add-Ons Electronic Signature(s) Signed: 01/27/2022 4:56:13 PM By: Rosalio Loud MSN RN CNS WTA Entered By: Rosalio Loud on 01/27/2022  11:53:55 -------------------------------------------------------------------------------- Vitals Details Patient Name: Date of Service: Raymond Eke D. 01/27/2022 11:30 A M Medical Record Number: [id number] Patient Account Number: [id number] Date of Birth/Sex: Treating RN: Apr 11, 1965 (56 y.o. Raymond [name] Primary Care Provider: Elisabeth [name] Other Clinician: Referring Provider: Treating Provider/Extender: Raymond [name], Raymond [name] Glenna [name] in Treatment: 3 Vital Signs Time Taken: 11:43 Temperature (F): 98.4 Height (in): 74 Pulse (bpm): 76 Weight (lbs): 371 Respiratory Rate (breaths/min): 18 Body Mass Index (BMI): 47.6 Blood Pressure (mmHg): 157/81 Reference Range: 80 - 120 mg / dl Electronic Signature(s) Signed: 01/27/2022 12:19:52 PM By:  Rosalio Loud MSN RN CNS WTA Entered By: Rosalio Loud on 01/27/2022 12:19:52

## 2022-01-27 NOTE — Progress Notes (Signed)
Raymond [name] ([id number]) [id number] Page 1 of 7 Visit Report for 01/27/2022 HPI Details Patient Name: Date of Service: Raymond [name]. 01/27/2022 11:30 A M Medical Record Number: [id number] Patient Account Number: [id number] Date of Birth/Sex: Treating RN: 05-May-1965 (56 y.o. [name] Primary Care Provider: [name] Other Clinician: Referring Provider: Treating Provider/Extender: RO BSO N, [place] EL Glenna Fellows in Treatment: 3 History of Present Illness HPI Description: 09/05/17-He is seeing an initial evaluation for a left plantar foot ulcer. He has a remote history of left great toe amputation. He states that 4-6 weeks ago he noted callus formation and ulceration. He has not seen primary care regarding this. He is not currently on antibiotic therapy. He does not routinely follow with podiatry. He states diabetic foot wear will arrive early next week. The EHR shows an A1c of 9% approximate 4 months ago but he states he had one and primary care a few weeks ago but does not know the results. He is neuropathic and does not complain of any pain, he is currently wearing crocs. 09/12/17-he is here in follow up evaluation for left plantar foot ulcer. There is improvement in both appearance and measurement. We will continue with same treatment plan and he will follow up next week. 09/19/17 on evaluation today patient actually appears to be doing excellent in regard to his ulcer on the invitation site of his left great foot plantar aspect. He's been tolerating the dressing changes without complication. In fact with the Prisma and the current measures he has been shown signs of excellent improvement week by week up to this point. We have been to breeding the wound in this seems to have been of great benefit for him. Fortunately there is no evidence of infection. 09/26/17 on evaluation today patient appears to be doing rather well in  regard to his wound. He did not know quite as much improvement this week as compared to last week. Nonetheless he still continues to show signs of improving to some degree. I do believe he may benefit from an offloading shoe. No fevers, chills, nausea, or vomiting noted at this time. 10/03/17 on evaluation today patient actually appears to be doing much better in regard to the amputation site plantar foot ulcer. Overall this appears significantly smaller even compared to previous. He's been tolerating the dressing changes without complication. He did get his diabetic shoes and I did have a look at them they appear to be fairly good. Nonetheless I do think that for the time being I would probably recommend he continue with the offloading shoe that we have been utilizing just due to the fact that with the dressing I don't know that his diabetic shoes are going to work as appropriately as far as not called an additional pressure and irritation to the area in question. He understands. 10/10/17 on evaluation today patient actually appears to be doing very well in regard to his plantar foot ulcer. He has been tolerating the dressing changes without complication. I do feel like he's making signs of good improvement and in fact of the wound bed appears to be better although it may not be significantly changed in size it appears healthier and I do believe is showing signs of improving. Nonetheless we gonna keep working towards healing as far as that is concerned. There is no evidence of infection. 10/17/17 on evaluation today patient actually appears to be doing poorly in regard to his plantar foot ulcer. Unfortunately other  than just the area where the wound is itself there appears to be a blister that communicates with the wound unfortunately. This is more lateral to the wound itself and also to the distal point of the amputation site. There does not appear to be any evidence of cellulitis spreading at the foot  but I do believe some of the drainage from the site itself is. When in nature. Nonetheless this blister area I think needs to be removed in order to allow for appropriate healing of the ulcer itself I think that is gonna be difficult for it to heal otherwise. This was discussed with the patient today. 10/24/17 on evaluation today patient actually appears to be doing better compared to last week even post debridement. He has shown signs of improvement he did test positive for Staphylococcus aureus. With that being said he notes that he's not have any discomfort and in general he does feel like things seem to been doing better. Fortunately there's no additional blistering and no evidence of remaining infection at this time. No fevers, chills, nausea, or vomiting noted at this time. He has three days of antibiotic left. 10/31/17 on evaluation today patient actually appears to be doing very well in regard to his ulcer on the left foot. He has been tolerating the dressing changes without complication. With that being said fortunately there appears to be no evidence of infection I do think he's made progress compared to previous. No fevers, chills, nausea, or vomiting noted at this time. 11/14/17 on evaluation today patient actually appears to be doing excellent in regard to his amputation site ulcer on his foot. In fact this appears to be completely healed at this point. There is no evidence of infection nor underline abscess and I did thoroughly evaluate the periwound location. Overall I'm very pleased with how things appear. Readmission: 01-03-2022 upon evaluation today patient appears for reevaluation here in the clinic although it has been since 2019 that I last saw him. This again has been over 4 years. With that being said he is having an issue with a wound on his foot unfortunately. He has not had any recent x-rays he tells me at this point which is unfortunate as avoid definitely can need to get  something started that can be one of the primary items to get moving forward with. With that being said I also think were probably can obtain a wound culture he is probably going to require some antibiotics at some point. I just want to make sure that we have him on the right thing when we do this. Currently the patient tells me that his wounds have been present in regard to the met head for about a month at this point. With that being said he also has a wound on the third toe which is the next toe this actually still present. This unfortunately has a lot of callus buildup around it but I think it is larger underneath than what it would appear on initial inspection. Patient does have a history of diabetes mellitus type 2, congestive heart failure, and COPD. NICHOLAI, WILLETTE ([id number]) [id number] Page 2 of 7 01-11-2022 upon evaluation patient's wounds actually are showing signs of doing about the same may be just slightly cleaner but definitely not where we want things to be as of yet. Fortunately there does not appear to be any signs of active infection locally nor systemically at this point which is great news. No fevers, chills, nausea, vomiting, or diarrhea.  Of note patient has had his MRI but we do not have the results of that as of yet we are still waiting for the reading on this at this point. 01-18-2022 upon evaluation today patient presents after having had his MRI finally complete. 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 The third and fourth digits seem to be early which is good news. Nonetheless we are still going to need to try to see about getting the infection under control he is already been on the antibiotic therapy which I think has done quite well. In fact I feel like he is showing signs of improvement already. 12/28; patient with a wound on the medial aspect of the left first  metatarsal head and an area on the left third toe with slight hammer deformity. He has had a previous left first toe amputation. We are using Iodoflex on the wound. He is a diabetic a recent MRI showed osteomyelitis we have been giving him Cipro and Doxy which he appears to be tolerating well. Electronic Signature(s) Signed: 01/27/2022 4:17:44 PM By: Linton Ham MD Entered By: Linton Ham on 01/27/2022 12:28:03 -------------------------------------------------------------------------------- Physical Exam Details Patient Name: Date of Service: Raymond Eke D. 01/27/2022 11:30 A M Medical Record Number: [id number] Patient Account Number: [id number] Date of Birth/Sex: Treating RN: [birth year] (56 y.o. [name] Primary Care Provider: [name] Other Clinician: Referring Provider: Treating Provider/Extender: RO BSO N, [place] EL Glenna Fellows in Treatment: 3 Constitutional Patient is hypertensive.. Pulse regular and within target range for patient.Marland Kitchen Respirations regular, non-labored and within target range.. Temperature is normal and within the target range for the patient.Marland Kitchen appears in no distress. Notes Wound exam; the patient's wounds are on the medial aspect of the left first metatarsal head and on the tip of his plantar third toe. There is callus around the wound edges on both of these but I did not debride surface of the wounds looks healthy using Iodoflex there is no evidence of surrounding infection none of these probe to bone Electronic Signature(s) Signed: 01/27/2022 4:17:44 PM By: Linton Ham MD Entered By: Linton Ham on 01/27/2022 12:29:02 -------------------------------------------------------------------------------- Physician Orders Details Patient Name: Date of Service: Raymond Eke D. 01/27/2022 11:30 A M Medical Record Number: [id number] Patient Account Number: [id number] Date of Birth/Sex: Treating RN: [birth year] (56  y.o. [name] Primary Care Provider: [name] Other Clinician: Referring Provider: Treating Provider/Extender: [address] MICHA EL Kahle, Mcqueen ([id number]) [id number] Page 3 of 7 Weeks in Treatment: 3 Verbal / Phone Orders: No Diagnosis Coding Follow-up Appointments Wound #2 Left,Medial Metatarsal head first Return Appointment in 2 weeks. Wound #3 Left T Third oe Return Appointment in 2 weeks. Bathing/ [employer] wounds with antibacterial soap and water. May shower; gently cleanse wound with antibacterial soap, rinse and pat dry prior to dressing wounds Off-Loading Open toe surgical shoe Wound Treatment Wound #2 - Metatarsal head first Wound Laterality: Left, Medial Cleanser: Byram Ancillary Kit - 15 Day Supply (Generic) 3 x Per Week/30 Days Discharge Instructions: Use supplies as instructed; Kit contains: (15) Saline Bullets; (15) 3x3 Gauze; 15 pr Gloves Cleanser: Soap and Water 3 x Per Week/30 Days Discharge Instructions: Gently cleanse wound with antibacterial soap, rinse and pat dry prior to dressing wounds Prim Dressing: Iodosorb 40 (g) (Dispense As Written) 3 x Per Week/30 Days ary Discharge Instructions: Apply IodoSorb to wound bed  only as directed. Secondary Dressing: Gauze 3 x Per Week/30 Days Discharge Instructions: As directed: dry, moistened with saline or moistened with Dakins Solution Secured With: Medipore T - 415M Medipore H Soft Cloth Surgical T ape ape, 2x2 (in/yd) 3 x Per Week/30 Days Wound #3 - T Third oe Wound Laterality: Left Cleanser: Soap and Water 3 x Per Week/30 Days Discharge Instructions: Gently cleanse wound with antibacterial soap, rinse and pat dry prior to dressing wounds Prim Dressing: Iodosorb 40 (g) (Dispense As Written) 3 x Per Week/30 Days ary Discharge Instructions: Apply IodoSorb to wound bed only as directed. Secondary Dressing: Gauze 3 x Per Week/30  Days Discharge Instructions: As directed: dry, moistened with saline or moistened with Dakins Solution Secured With: Medipore T - 415M Medipore H Soft Cloth Surgical T ape ape, 2x2 (in/yd) 3 x Per Week/30 Days Electronic Signature(s) Signed: 01/27/2022 12:21:09 PM By: Rosalio Loud MSN RN CNS WTA Signed: 01/27/2022 4:17:44 PM By: Linton Ham MD Entered By: Rosalio Loud on 01/27/2022 12:21:08 -------------------------------------------------------------------------------- Problem List Details Patient Name: Date of Service: Raymond Eke D. 01/27/2022 11:30 A M Medical Record Number: [id number] Patient Account Number: [id number] Date of Birth/Sex: Treating RN: 12-12-1965 (56 y.o. [name] Primary Care Provider: [name] Other Clinician: Referring Provider: Treating Provider/Extender: [place] Dace, [place] EL Glenna Fellows in Treatment: 3 ALAMIN, MCCUISTON ([id number]) [id number] Page 4 of 7 Active Problems ICD-10 Encounter Code Description Active Date MDM Diagnosis E11.621 Type 2 diabetes mellitus with foot ulcer 01/03/2022 No Yes L97.522 Non-pressure chronic ulcer of other part of left foot with fat layer exposed 01/03/2022 No Yes I50.42 Chronic combined systolic (congestive) and diastolic (congestive) heart failure 01/03/2022 No Yes J44.9 Chronic obstructive pulmonary disease, unspecified 01/03/2022 No Yes Inactive Problems Resolved Problems Electronic Signature(s) Signed: 01/27/2022 4:17:44 PM By: Linton Ham MD Entered By: Linton Ham on 01/27/2022 12:26:38 -------------------------------------------------------------------------------- Progress Note Details Patient Name: Date of Service: Raymond Eke D. 01/27/2022 11:30 A M Medical Record Number: [id number] Patient Account Number: [id number] Date of Birth/Sex: Treating RN: 1965-04-06 (56 y.o. [name] Primary Care Provider: [name] Other  Clinician: Referring Provider: Treating Provider/Extender: RO BSO N, [place] EL Glenna Fellows in Treatment: 3 Subjective History of Present Illness (HPI) 09/05/17-He is seeing an initial evaluation for a left plantar foot ulcer. He has a remote history of left great toe amputation. He states that 4-6 weeks ago he noted callus formation and ulceration. He has not seen primary care regarding this. He is not currently on antibiotic therapy. He does not routinely follow with podiatry. He states diabetic foot wear will arrive early next week. The EHR shows an A1c of 9% approximate 4 months ago but he states he had one and primary care a few weeks ago but does not know the results. He is neuropathic and does not complain of any pain, he is currently wearing crocs. 09/12/17-he is here in follow up evaluation for left plantar foot ulcer. There is improvement in both appearance and measurement. We will continue with same treatment plan and he will follow up next week. 09/19/17 on evaluation today patient actually appears to be doing excellent in regard to his ulcer on the invitation site of his left great foot plantar aspect. He's been tolerating the dressing changes without complication. In fact with the Prisma and the current measures he has been shown signs of excellent improvement week by week up to this point. We have been to breeding  the wound in this seems to have been of great benefit for him. Fortunately there is no evidence of infection. 09/26/17 on evaluation today patient appears to be doing rather well in regard to his wound. He did not know quite as much improvement this week as compared to last week. Nonetheless he still continues to show signs of improving to some degree. I do believe he may benefit from an offloading shoe. No fevers, chills, nausea, or vomiting noted at this time. 10/03/17 on evaluation today patient actually appears to be doing much better in regard to the amputation  site plantar foot ulcer. Overall this appears significantly smaller even compared to previous. He's been tolerating the dressing changes without complication. He did get his diabetic shoes and I did have a look at them they appear to be fairly good. Nonetheless I do think that for the time being I would probably recommend he continue with the offloading shoe that we have been utilizing just due to the fact that with the dressing I don't know that his diabetic shoes are going to work as appropriately as far as not called an Raymond [name], Raymond [name] ([id number]) [id number] Page 5 of 7 additional pressure and irritation to the area in question. He understands. 10/10/17 on evaluation today patient actually appears to be doing very well in regard to his plantar foot ulcer. He has been tolerating the dressing changes without complication. I do feel like he's making signs of good improvement and in fact of the wound bed appears to be better although it may not be significantly changed in size it appears healthier and I do believe is showing signs of improving. Nonetheless we gonna keep working towards healing as far as that is concerned. There is no evidence of infection. 10/17/17 on evaluation today patient actually appears to be doing poorly in regard to his plantar foot ulcer. Unfortunately other than just the area where the wound is itself there appears to be a blister that communicates with the wound unfortunately. This is more lateral to the wound itself and also to the distal point of the amputation site. There does not appear to be any evidence of cellulitis spreading at the foot but I do believe some of the drainage from the site itself is. When in nature. Nonetheless this blister area I think needs to be removed in order to allow for appropriate healing of the ulcer itself I think that is gonna be difficult for it to heal otherwise. This was discussed with the patient  today. 10/24/17 on evaluation today patient actually appears to be doing better compared to last week even post debridement. He has shown signs of improvement he did test positive for Staphylococcus aureus. With that being said he notes that he's not have any discomfort and in general he does feel like things seem to been doing better. Fortunately there's no additional blistering and no evidence of remaining infection at this time. No fevers, chills, nausea, or vomiting noted at this time. He has three days of antibiotic left. 10/31/17 on evaluation today patient actually appears to be doing very well in regard to his ulcer on the left foot. He has been tolerating the dressing changes without complication. With that being said fortunately there appears to be no evidence of infection I do think he's made progress compared to previous. No fevers, chills, nausea, or vomiting noted at this time. 11/14/17 on evaluation today patient actually appears to be doing excellent in regard to his amputation site ulcer  on his foot. In fact this appears to be completely healed at this point. There is no evidence of infection nor underline abscess and I did thoroughly evaluate the periwound location. Overall I'm very pleased with how things appear. Readmission: 01-03-2022 upon evaluation today patient appears for reevaluation here in the clinic although it has been since 2019 that I last saw him. This again has been over 4 years. With that being said he is having an issue with a wound on his foot unfortunately. He has not had any recent x-rays he tells me at this point which is unfortunate as avoid definitely can need to get something started that can be one of the primary items to get moving forward with. With that being said I also think were probably can obtain a wound culture he is probably going to require some antibiotics at some point. I just want to make sure that we have him on the right thing when we do this.  Currently the patient tells me that his wounds have been present in regard to the met head for about a month at this point. With that being said he also has a wound on the third toe which is the next toe this actually still present. This unfortunately has a lot of callus buildup around it but I think it is larger underneath than what it would appear on initial inspection. Patient does have a history of diabetes mellitus type 2, congestive heart failure, and COPD. 01-11-2022 upon evaluation patient's wounds actually are showing signs of doing about the same may be just slightly cleaner but definitely not where we want things to be as of yet. Fortunately there does not appear to be any signs of active infection locally nor systemically at this point which is great news. No fevers, chills, nausea, vomiting, or diarrhea. Of note patient has had his MRI but we do not have the results of that as of yet we are still waiting for the reading on this at this point. 01-18-2022 upon evaluation today patient presents after having had his MRI finally complete. 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 The third and fourth digits seem to be early which is good news. Nonetheless we are still going to need to try to see about getting the infection under control he is already been on the antibiotic therapy which I think has done quite well. In fact I feel like he is showing signs of improvement already. 12/28; patient with a wound on the medial aspect of the left first metatarsal head and an area on the left third toe with slight hammer deformity. He has had a previous left first toe amputation. We are using Iodoflex on the wound. He is a diabetic a recent MRI showed osteomyelitis we have been giving him Cipro and Doxy which he appears to be tolerating well. Objective Constitutional Patient is hypertensive.. Pulse regular and within target range for  patient.Marland Kitchen Respirations regular, non-labored and within target range.. Temperature is normal and within the target range for the patient.Marland Kitchen appears in no distress. Vitals Time Taken: 11:43 AM, Height: 74 in, Weight: 371 lbs, BMI: 47.6, Temperature: 98.4 F, Pulse: 76 bpm, Respiratory Rate: 18 breaths/min, Blood Pressure: 157/81 mmHg. General Notes: Wound exam; the patient's wounds are on the medial aspect of the left first metatarsal head and on the tip of his plantar third toe. There is callus around the wound edges on both  of these but I did not debride surface of the wounds looks healthy using Iodoflex there is no evidence of surrounding infection none of these probe to bone Integumentary (Hair, Skin) Wound #2 status is Open. Original cause of wound was Gradually Appeared. The date acquired was: 12/13/2021. The wound has been in treatment 3 weeks. The wound is located on the Left,Medial Metatarsal head first. The wound measures 1.1cm length x 0.5cm width x 0.1cm depth; 0.432cm^2 area and 0.043cm^3 volume. There is Fat Layer (Subcutaneous Tissue) exposed. There is no tunneling noted, however, there is undermining starting at 11:00 and ending at 12:00 with a maximum distance of 0.4cm. There is a medium amount of serous drainage noted. The wound margin is flat and intact. There is no granulation within the wound bed. There is a large (67-100%) amount of necrotic tissue within the wound bed including Adherent Slough. Wound #3 status is Open. Original cause of wound was Footwear Injury. The date acquired was: 01/03/2022. The wound has been in treatment 3 weeks. The wound is located on the Left T Third. The wound measures 0.7cm length x 0.7cm width x 0.1cm depth; 0.385cm^2 area and 0.038cm^3 volume. There is Fat oe Layer (Subcutaneous Tissue) exposed. There is a medium amount of serous drainage noted. The wound margin is flat and intact. There is no granulation within the wound bed. There is a large  (67-100%) amount of necrotic tissue within the wound bed. 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 Wound #3 Left T Third: oe Return Appointment in 2 weeks. Bathing/ Shower/ Hygiene: Wash wounds with antibacterial soap and water. May shower; gently cleanse wound with antibacterial soap, rinse and pat dry prior to dressing wounds Off-Loading: Open toe surgical shoe WOUND #2: - Metatarsal head first Wound Laterality: Left, Medial Cleanser: [employer] Ancillary Kit - 15 Day Supply (Generic) 3 x Per Week/30 Days Discharge Instructions: Use supplies as instructed; Kit contains: (15) Saline Bullets; (15) 3x3 Gauze; 15 pr Gloves Cleanser: Soap and Water 3 x Per Week/30 Days Discharge Instructions: Gently cleanse wound with antibacterial soap, rinse and pat dry prior to dressing wounds Prim Dressing: Iodosorb 40 (g) (Dispense As Written) 3 x Per Week/30 Days ary Discharge Instructions: Apply IodoSorb to wound bed only as directed. Secondary Dressing: Gauze 3 x Per Week/30 Days Discharge Instructions: As directed: dry, moistened with saline or moistened with Dakins Solution Secured With: Medipore T - 4M Medipore H Soft Cloth Surgical T ape ape, 2x2 (in/yd) 3 x Per Week/30 Days WOUND #3: - T Third Wound Laterality: Left oe Cleanser: Soap and Water 3 x Per Week/30 Days Discharge Instructions: Gently cleanse wound with antibacterial soap, rinse and pat dry prior to dressing wounds Prim Dressing: Iodosorb 40 (g) (Dispense As Written) 3 x Per Week/30 Days ary Discharge  Instructions: Apply IodoSorb to wound bed only as directed. Secondary Dressing: Gauze 3 x Per Week/30 Days Discharge Instructions: As directed: dry, moistened with saline or moistened with Dakins Solution Secured With: Medipore T - 4M Medipore H Soft Cloth Surgical T ape ape, 2x2 (in/yd) 3 x Per Week/30 Days 1. Osteomyelitis documented by MRI he is on antibiotics which she seems to be tolerating 2. I  did not change the primary dressing which is Iodoflex to both wound areas 3. Cautioned against allowing pressure of the slightly hammer deformity third toe on the bottom of the shoe. 4. May require debridement of the edges of the first metatarsal head wound I did not do that today Electronic Signature(s) Signed: 01/27/2022 4:17:44 PM By: Linton Ham MD Entered By: Linton Ham on 01/27/2022 12:29:56 -------------------------------------------------------------------------------- SuperBill Details Patient Name: Date of Service: [name], [name] 01/27/2022 Medical Record Number: [id number] Patient Account Number: [id number] Date of Birth/Sex: Treating RN: 1965/03/19 (56 y.o. 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 (congestive) heart failure J44.9 Chronic obstructive pulmonary disease, unspecified Facility Procedures : CPT4 Code: 32202542 Description: 70623 - WOUND CARE VISIT-LEV 4 EST PT Modifier: Quantity: 1 Physician Procedures : CPT4 Code Description Modifier [id number] - WC PHYS LEVEL 3 - EST PT ICD-10  Diagnosis Description E11.621 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

## 2022-01-31 ENCOUNTER — Emergency Department: Payer: Medicare HMO

## 2022-01-31 ENCOUNTER — Other Ambulatory Visit: Payer: Self-pay

## 2022-01-31 ENCOUNTER — Emergency Department
Admission: EM | Admit: 2022-01-31 | Discharge: 2022-01-31 | Disposition: A | Discharge status: Home / Self Care | Payer: Medicare HMO | Attending: Emergency Medicine | Admitting: Emergency Medicine

## 2022-01-31 DIAGNOSIS — M25561 Pain in right knee: Secondary | ICD-10-CM

## 2022-01-31 DIAGNOSIS — M7989 Other specified soft tissue disorders: Secondary | ICD-10-CM

## 2022-01-31 DIAGNOSIS — W19XXXA Unspecified fall, initial encounter: Secondary | ICD-10-CM | POA: Insufficient documentation

## 2022-01-31 DIAGNOSIS — R2241 Localized swelling, mass and lump, right lower limb: Secondary | ICD-10-CM | POA: Diagnosis not present

## 2022-01-31 DIAGNOSIS — E119 Type 2 diabetes mellitus without complications: Secondary | ICD-10-CM | POA: Diagnosis not present

## 2022-01-31 LAB — CBC WITH DIFFERENTIAL/PLATELET
Abs Immature Granulocytes: 0.03 10*3/uL (ref 0.00–0.07)
Basophils Absolute: 0.1 10*3/uL (ref 0.0–0.1)
Basophils Relative: 1 %
Eosinophils Absolute: 1.1 10*3/uL — ABNORMAL HIGH (ref 0.0–0.5)
Eosinophils Relative: 11 %
HCT: 41.4 % (ref 39.0–52.0)
Hemoglobin: 12.5 g/dL — ABNORMAL LOW (ref 13.0–17.0)
Immature Granulocytes: 0 %
Lymphocytes Relative: 27 %
Lymphs Abs: 2.6 10*3/uL (ref 0.7–4.0)
MCH: 25.2 pg — ABNORMAL LOW (ref 26.0–34.0)
MCHC: 30.2 g/dL (ref 30.0–36.0)
MCV: 83.3 fL (ref 80.0–100.0)
Monocytes Absolute: 0.6 10*3/uL (ref 0.1–1.0)
Monocytes Relative: 7 %
Neutro Abs: 5.1 10*3/uL (ref 1.7–7.7)
Neutrophils Relative %: 54 %
Platelets: 268 10*3/uL (ref 150–400)
RBC: 4.97 MIL/uL (ref 4.22–5.81)
RDW: 15.7 % — ABNORMAL HIGH (ref 11.5–15.5)
WBC: 9.6 10*3/uL (ref 4.0–10.5)
nRBC: 0 % (ref 0.0–0.2)

## 2022-01-31 LAB — BASIC METABOLIC PANEL
Anion gap: 8 (ref 5–15)
BUN: 22 mg/dL — ABNORMAL HIGH (ref 6–20)
CO2: 29 mmol/L (ref 22–32)
Calcium: 8.8 mg/dL — ABNORMAL LOW (ref 8.9–10.3)
Chloride: 105 mmol/L (ref 98–111)
Creatinine, Ser: 1.1 mg/dL (ref 0.61–1.24)
GFR, Estimated: >60 mL/min (ref >60)
Glucose, Bld: 64 mg/dL — ABNORMAL LOW (ref 70–99)
Potassium: 3.9 mmol/L (ref 3.5–5.1)
Sodium: 142 mmol/L (ref 135–145)

## 2022-01-31 LAB — SEDIMENTATION RATE: Sed Rate: 12 mm/hr (ref 0–20)

## 2022-01-31 MED ORDER — HYDROCODONE-ACETAMINOPHEN 5-325 MG PO TABS: 1.0000 | ORAL_TABLET | Freq: Four times a day (QID) | ORAL | 0 refills | Status: DC | PRN

## 2022-01-31 MED ORDER — HYDROCODONE-ACETAMINOPHEN 5-325 MG PO TABS
1.0000 | ORAL_TABLET | Freq: Once | ORAL | Status: AC
  Administered 2022-01-31: 1 via ORAL
  Filled 2022-01-31: qty 1

## 2022-01-31 MED ORDER — MUPIROCIN 2 % EX OINT: 1.0000 | TOPICAL_OINTMENT | Freq: Two times a day (BID) | CUTANEOUS | 0 refills | Status: AC

## 2022-01-31 NOTE — ED Triage Notes (Signed)
Pt in via EMS from home with c/o infection to left foot. 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 Pt is undergoing treatment currently. Pt fell and has laceration to right knee with hematoma. Denies dizziness. Pt reports balance issues since amputations 160/70, HR 68, 97% RA

## 2022-01-31 NOTE — ED Provider Triage Note (Signed)
Emergency Medicine Provider Triage Evaluation Note  Raymond [name] , a 57 y.o. male  was evaluated in triage.  Pt complains of right knee pain, right foot pain and ankle pain, swelling in the lower leg after a fall.  Symptoms for several days.  Review of Systems  Positive:  Negative:   Physical Exam  BP (!) 148/75   Pulse 67   Temp 98 F (36.7 C) (Oral)   Resp 18   SpO2 94%  Gen:   Awake, no distress   Resp:  Normal effort  MSK:   Moves extremities without difficulty, right knee and ankle tender, large amount of swelling some redness noted in the right lower extremity Other:    Medical Decision Making  Medically screening exam initiated at 11:56 AM.  Appropriate orders placed.  Fayrene Helper Quinto was informed that the remainder of the evaluation will be completed by another provider, this initial triage assessment does not replace that evaluation, and the importance of remaining in the ED until their evaluation is complete.  X-ray and ultrasound ordered   Versie Starks, PA-C 01/31/22 1157

## 2022-01-31 NOTE — ED Notes (Signed)
Pain med given  crackers and diet ale given to pt

## 2022-01-31 NOTE — Discharge Instructions (Addendum)
Apply the bacitracin ointment twice daily until the wound is healed  Take the medication as needed for severe pain  Continue your wound care for your left foot

## 2022-01-31 NOTE — ED Triage Notes (Signed)
Pt presents to the ED via [facility] due to fall. Pt recently had toes amputated and is having troubles balancing. Pt states he had a fall a couple days ago and is having some pain in his lower R leg. Pt A&Ox4.

## 2022-01-31 NOTE — ED Provider Notes (Addendum)
 [facility] Provider Note    Event Date/Time   First MD Initiated Contact with Patient 01/31/22 1357     (approximate)   History   Fall   HPI  Raymond [name] is a 56 y.o. male with past medical history diabetes, peripheral vascular disease, sleep apnea, obesity, here with right leg pain.  The patient states that he fell twice several days ago.  He states that 1 time he lost his footing and the other his left leg buckled.  He wears a boot due to wound on his left leg and states this is fairly common.  He states that over the last day, he has had progressive worsening aching, throbbing, right knee and lower leg pain.  He normally has fairly severe paresthesias/neuropathy so having pain is a new sensation for him.  He does not recall if he had this when he fell.  Pain is worse with movement and palpation.  He also states that he and a friend noticed redness streaking along the medial aspect of his right leg today so he presents for evaluation.  Has a chronic wound on his left foot which is currently being treated at the wound clinic and he believes this has been slowly improving.     Physical Exam   Triage Vital Signs: ED Triage Vitals  Enc Vitals Group     BP 01/31/22 1152 (!) 148/75     Pulse Rate 01/31/22 1152 67     Resp 01/31/22 1152 18     Temp 01/31/22 1152 98 F (36.7 C)     Temp Source 01/31/22 1152 Oral     SpO2 01/31/22 1152 94 %     Weight 01/31/22 1358 (!) 369 lb 0.8 oz (167.4 kg)     Height 01/31/22 1358 6' 2" (1.88 m)     Head Circumference --      Peak Flow --      Pain Score 01/31/22 1152 8     Pain Loc --      Pain Edu? --      Excl. in GC? --     Most recent vital signs: Vitals:   01/31/22 1152  BP: (!) 148/75  Pulse: 67  Resp: 18  Temp: 98 F (36.7 C)  SpO2: 94%     General: Awake, no distress.  CV:  Good peripheral perfusion.  Regular rate and rhythm. Resp:  Normal effort.  Lungs clear  bilaterally. Abd:  No distention.  Other:  Right lower extremity with superficial, healing abrasion to the right anterolateral knee.  There is mild surrounding warmth with minimal erythema.  No streaking noted throughout the right lower extremity.  There is mild asymmetric edema.   ED Results / Procedures / Treatments   Labs (all labs ordered are listed, but only abnormal results are displayed) Labs Reviewed  CBC WITH DIFFERENTIAL/PLATELET  BASIC METABOLIC PANEL  SEDIMENTATION RATE  C-REACTIVE PROTEIN     EKG    RADIOLOGY DG ankle right: Distal medial malleolar spurring, chronic changes but no acute fracture DG knee right: Chronic arthritic changes but otherwise no acute fracture Ultrasound right: Pending   I also independently reviewed and agree with radiologist interpretations.   PROCEDURES:  Critical Care performed: No   MEDICATIONS ORDERED IN ED: Medications - No data to display   IMPRESSION / MDM / ASSESSMENT AND PLAN / ED COURSE  I reviewed the triage vital signs and the nursing notes.                                Differential diagnosis includes, but is not limited to, occult fracture, contusion, skin infection such as cellulitis, DVT, neuropathy, lumbar radiculopathy  Patient's presentation is most consistent with acute presentation with potential threat to life or bodily function.  The patient is on the cardiac monitor to evaluate for evidence of arrhythmia and/or significant heart rate changes.  57 year old male with extensive past medical history including diabetes, peripheral vascular disease, here with right knee pain and superficial wound.  Patient is otherwise hemodynamically stable.  Regarding the wound, he has some minimal surrounding erythema but clinically does not appear to be overtly infected.  However, he is very concerned about some erythema that he saw streaking up his leg earlier today.  Plain film showed no acute fracture.  Will obtain  screening infectious labs.  If concern for acute infection, patient would likely need IV antibiotics as he is already on doxycycline and Cipro as an outpatient for his wound, which she is adamant seems to be improving and is not more painful on his left foot.  Will also follow-up DVT study.   Labs overall very reassuring. CBC without leukocytosis. ESR is normal. CMP at baseline. Plain films reviewed and are negative. Exam remains reassuring without signs of significant infection. Will plant o f/u US, plant o d/c with symptomatic control, topical mupirocin and f/u if negative.  FINAL CLINICAL IMPRESSION(S) / ED DIAGNOSES   Final diagnoses:  Acute pain of right knee  Leg swelling     Rx / DC Orders   ED Discharge Orders     None        Note:  This document was prepared using Dragon voice recognition software and may include unintentional dictation errors.   Duffy Bruce, MD 01/31/22 Darrol Angel    Duffy Bruce, MD 01/31/22 2000

## 2022-02-01 LAB — C-REACTIVE PROTEIN: CRP: 2.5 mg/dL — ABNORMAL HIGH (ref <1.0)

## 2022-02-10 ENCOUNTER — Encounter: Payer: Medicare HMO | Attending: Physician Assistant | Admitting: Physician Assistant

## 2022-02-10 DIAGNOSIS — E11621 Type 2 diabetes mellitus with foot ulcer: Secondary | ICD-10-CM | POA: Insufficient documentation

## 2022-02-10 DIAGNOSIS — M86372 Chronic multifocal osteomyelitis, left ankle and foot: Secondary | ICD-10-CM | POA: Diagnosis present

## 2022-02-10 NOTE — Progress Notes (Addendum)
MERRIK, PUEBLA ([id number]) [id number] Page 1 of 12 Visit Report for 02/10/2022 Chief Complaint Document Details Patient Name: Date of Service: Raymond [name]. 02/10/2022 9:30 A M Medical Record Number: [id number] Patient Account Number: [id number] Date of Birth/Sex: Treating RN: Sep 04, 1965 (57 y.o. [name] Primary Care Provider: [name] Other Clinician: Referring Provider: Treating Provider/Extender: Hazle Coca in Treatment: 5 Information Obtained from: Patient Chief Complaint Left foot ulcer with chronic osteomyelitis Electronic Signature(s) Signed: 02/10/2022 4:58:04 PM By: [name] PA-C Previous Signature: 02/10/2022 10:06:28 AM Version By: [name] PA-C Entered By: [name] on 02/10/2022 16:58:04 -------------------------------------------------------------------------------- Debridement Details Patient Name: Date of Service: Raymond [name]. 02/10/2022 9:30 A M Medical Record Number: [id number] Patient Account Number: [id number] Date of Birth/Sex: Treating RN: 1965/05/03 (57 y.o. [name] Primary Care Provider: [name] Other Clinician: Referring Provider: Treating Provider/Extender: [name] Weeks in Treatment: 5 Debridement Performed for Assessment: Wound #2 Left,Medial Metatarsal head first Performed By: Physician Tommie Sams., PA-C Debridement Type: Debridement Severity of Tissue Pre Debridement: Fat layer exposed Level of Consciousness (Pre-procedure): Awake and Alert Pre-procedure Verification/Time Out No Taken: T Area Debrided (L x W): otal 1 (cm) x 0.7 (cm) = 0.7 (cm) Tissue and other material debrided: Viable, Non-Viable, Callus Level: Non-Viable Tissue Debridement Description: Selective/Open Wound Instrument: Curette Bleeding: Minimum Hemostasis Achieved: Pressure Response to Treatment: Procedure was tolerated  well Level of Consciousness (Post- Awake and Alert procedure): Raymond [name], Raymond [name] ([id number]) [id number] Page 2 of 12 Post Debridement Measurements of Total Wound Length: (cm) 1 Width: (cm) 0.7 Depth: (cm) 0.2 Volume: (cm) 0.11 Character of Wound/Ulcer Post Debridement: Stable Severity of Tissue Post Debridement: Fat layer exposed Post Procedure Diagnosis Same as Pre-procedure Electronic Signature(s) Signed: 02/10/2022 5:05:26 PM By: [name] PA-C Signed: 02/10/2022 5:09:02 PM By: Rosalio Loud MSN RN CNS WTA Entered By: Rosalio Loud on 02/10/2022 10:24:48 -------------------------------------------------------------------------------- Debridement Details Patient Name: Date of Service: Raymond [name]. 02/10/2022 9:30 A M Medical Record Number: [id number] Patient Account Number: [id number] Date of Birth/Sex: Treating RN: 05/09/1965 (57 y.o. [name] Primary Care Provider: [name] Other Clinician: Referring Provider: Treating Provider/Extender: [name] Weeks in Treatment: 5 Debridement Performed for Assessment: Wound #3 Left T Third oe Performed By: Physician Tommie Sams., PA-C Debridement Type: Debridement Severity of Tissue Pre Debridement: Fat layer exposed Level of Consciousness (Pre-procedure): Awake and Alert Pre-procedure Verification/Time Out No Taken: T Area Debrided (L x W): otal 1 (cm) x 0.8 (cm) = 0.8 (cm) Tissue and other material debrided: Viable, Non-Viable, Callus Level: Non-Viable Tissue Debridement Description: Selective/Open Wound Instrument: Curette Bleeding: Minimum Hemostasis Achieved: Pressure Response to Treatment: Procedure was tolerated well Level of Consciousness (Post- Awake and Alert procedure): Post Debridement Measurements of Total Wound Length: (cm) 1 Width: (cm) 0.8 Depth: (cm) 0.2 Volume: (cm) 0.126 Character of Wound/Ulcer Post Debridement:  Stable Severity of Tissue Post Debridement: Fat layer exposed Post Procedure Diagnosis Same as Pre-procedure Electronic Signature(s) Signed: 02/10/2022 5:05:26 PM By: [name] PA-C Signed: 02/10/2022 5:09:02 PM By: Rosalio Loud MSN RN CNS WTA Entered By: Rosalio Loud on 02/10/2022 10:25:53 Raymond [name] ([id number]) [id number] Page 3 of 12 -------------------------------------------------------------------------------- HPI Details Patient Name: Date of Service: Raymond [name]. 02/10/2022 9:30 A M Medical Record Number: [id number] Patient Account Number: [id number] Date of Birth/Sex: Treating RN: 04/18/1965 (57 y.o. [name] Primary Care Provider: [name]  Other Clinician: Referring Provider: Treating Provider/Extender: Hazle Coca in Treatment: 5 History of Present Illness HPI Description: 09/05/17-He is seeing an initial evaluation for a left plantar foot ulcer. He has a remote history of left great toe amputation. He states that 4-6 weeks ago he noted callus formation and ulceration. He has not seen primary care regarding this. He is not currently on antibiotic therapy. He does not routinely follow with podiatry. He states diabetic foot wear will arrive early next week. The EHR shows an A1c of 9% approximate 4 months ago but he states he had one and primary care a few weeks ago but does not know the results. He is neuropathic and does not complain of any pain, he is currently wearing crocs. 09/12/17-he is here in follow up evaluation for left plantar foot ulcer. There is improvement in both appearance and measurement. We will continue with same treatment plan and he will follow up next week. 09/19/17 on evaluation today patient actually appears to be doing excellent in regard to his ulcer on the invitation site of his left great foot plantar aspect. He's been tolerating the dressing changes without  complication. In fact with the Prisma and the current measures he has been shown signs of excellent improvement week by week up to this point. We have been to breeding the wound in this seems to have been of great benefit for him. Fortunately there is no evidence of infection. 09/26/17 on evaluation today patient appears to be doing rather well in regard to his wound. He did not know quite as much improvement this week as compared to last week. Nonetheless he still continues to show signs of improving to some degree. I do believe he may benefit from an offloading shoe. No fevers, chills, nausea, or vomiting noted at this time. 10/03/17 on evaluation today patient actually appears to be doing much better in regard to the amputation site plantar foot ulcer. Overall this appears significantly smaller even compared to previous. He's been tolerating the dressing changes without complication. He did get his diabetic shoes and I did have a look at them they appear to be fairly good. Nonetheless I do think that for the time being I would probably recommend he continue with the offloading shoe that we have been utilizing just due to the fact that with the dressing I don't know that his diabetic shoes are going to work as appropriately as far as not called an additional pressure and irritation to the area in question. He understands. 10/10/17 on evaluation today patient actually appears to be doing very well in regard to his plantar foot ulcer. He has been tolerating the dressing changes without complication. I do feel like he's making signs of good improvement and in fact of the wound bed appears to be better although it may not be significantly changed in size it appears healthier and I do believe is showing signs of improving. Nonetheless we gonna keep working towards healing as far as that is concerned. There is no evidence of infection. 10/17/17 on evaluation today patient actually appears to be doing poorly in  regard to his plantar foot ulcer. Unfortunately other than just the area where the wound is itself there appears to be a blister that communicates with the wound unfortunately. This is more lateral to the wound itself and also to the distal point of the amputation site. There does not appear to be any evidence of cellulitis spreading at the foot but I do  believe some of the drainage from the site itself is. When in nature. Nonetheless this blister area I think needs to be removed in order to allow for appropriate healing of the ulcer itself I think that is gonna be difficult for it to heal otherwise. This was discussed with the patient today. 10/24/17 on evaluation today patient actually appears to be doing better compared to last week even post debridement. He has shown signs of improvement he did test positive for Staphylococcus aureus. With that being said he notes that he's not have any discomfort and in general he does feel like things seem to been doing better. Fortunately there's no additional blistering and no evidence of remaining infection at this time. No fevers, chills, nausea, or vomiting noted at this time. He has three days of antibiotic left. 10/31/17 on evaluation today patient actually appears to be doing very well in regard to his ulcer on the left foot. He has been tolerating the dressing changes without complication. With that being said fortunately there appears to be no evidence of infection I do think he's made progress compared to previous. No fevers, chills, nausea, or vomiting noted at this time. 11/14/17 on evaluation today patient actually appears to be doing excellent in regard to his amputation site ulcer on his foot. In fact this appears to be completely healed at this point. There is no evidence of infection nor underline abscess and I did thoroughly evaluate the periwound location. Overall I'm very pleased with how things appear. Readmission: 01-03-2022 upon evaluation  today patient appears for reevaluation here in the clinic although it has been since 2019 that I last saw him. This again has been over 4 years. With that being said he is having an issue with a wound on his foot unfortunately. He has not had any recent x-rays he tells me at this point which is unfortunate as avoid definitely can need to get something started that can be one of the primary items to get moving forward with. With that being said I also think were probably can obtain a wound culture he is probably going to require some antibiotics at some point. I just want to make sure that we have him on the right thing when we do this. Currently the patient tells me that his wounds have been present in regard to the met head for about a month at this point. With that being said he also has a wound on the third toe which is the next toe this actually still present. This unfortunately has a lot of callus buildup around it but I think it is larger underneath than what it would appear on initial inspection. Patient does have a history of diabetes mellitus type 2, congestive heart failure, and COPD. 01-11-2022 upon evaluation patient's wounds actually are showing signs of doing about the same may be just slightly cleaner but definitely not where we want things to be as of yet. Fortunately there does not appear to be any signs of active infection locally nor systemically at this point which is great news. No fevers, chills, nausea, vomiting, or diarrhea. DEMARRI, Raymond [name] ([id number]) [id number] Page 4 of 12 Of note patient has had his MRI but we do not have the results of that as of yet we are still waiting for the reading on this at this point. 01-18-2022 upon evaluation today patient presents after having had his MRI finally complete. It did show that he has evidence of osteomyelitis of the first metatarsal  head, third digit, and fourth digit. The third and fourth digits  seem to be early which is good news. Nonetheless we are still going to need to try to see about getting the infection under control he is already been on the antibiotic therapy which I think has done quite well. In fact I feel like he is showing signs of improvement already. 12/28; patient with a wound on the medial aspect of the left first metatarsal head and an area on the left third toe with slight hammer deformity. He has had a previous left first toe amputation. We are using Iodoflex on the wound. He is a diabetic a recent MRI showed osteomyelitis we have been giving him Cipro and Doxy which he appears to be tolerating well. 02-10-2022 upon evaluation today patient unfortunately has issues still with open wounds of his foot on the left. He does have known osteomyelitis at this point and that is something that we have been treating with oral antibiotics. I actually have not seen him personally since 19 December. In that time he has not had any debridement from that point until today based on what I can see as best I can tell anyway. Has been on Cipro as well as doxycycline. Nonetheless he does still seem to have open wounds today and I think that he would benefit from hyperbaric oxygen therapy. I discussed that with him today as well. Electronic Signature(s) Signed: 02/10/2022 4:58:21 PM By: [name] PA-C Entered By: [name] on 02/10/2022 16:58:21 -------------------------------------------------------------------------------- Physical Exam Details Patient Name: Date of Service: Raymond [name]. 02/10/2022 9:30 A M Medical Record Number: [id number] Patient Account Number: [id number] Date of Birth/Sex: Treating RN: 1965-09-17 (57 y.o. [name] Primary Care Provider: [name] Other Clinician: Referring Provider: Treating Provider/Extender: [name] Weeks in Treatment: 5 Constitutional Well-nourished and well-hydrated in no acute  distress. Respiratory normal breathing without difficulty. Notes Upon inspection patient's wounds actually showed signs of a lot of callus and buildup. Once I remove the callus on both the toe as well as the first metatarsal area the wound really is not significantly smaller compared to previous. I discussed with the patient that I really do think he would benefit from 8 hyperbaric oxygen therapy. The biggest issue here is getting this approved and making sure that he has the ability to make it here as far as transportation is concerned. Electronic Signature(s) Signed: 02/10/2022 4:59:03 PM By: [name] PA-C Entered By: [name] on 02/10/2022 16:59:03 -------------------------------------------------------------------------------- Physician Orders Details Patient Name: Date of Service: Raymond [name]. 02/10/2022 9:30 A M Medical Record Number: [id number] Patient Account Number: [id number] Date of Birth/Sex: Treating RN: 1965/10/16 (57 y.o. Caesar, Mannella, Lendon Ka ([id number]) [id number].pdf Page 5 of 12 Primary Care Provider: [name] Other Clinician: Referring Provider: Treating Provider/Extender: Hazle Coca in Treatment: 5 Verbal / Phone Orders: No Diagnosis Coding ICD-10 Coding Code Description [phone] Chronic multifocal osteomyelitis, left ankle and foot E11.621 Type 2 diabetes mellitus with foot ulcer L97.522 Non-pressure chronic ulcer of other part of left foot with fat layer exposed I50.42 Chronic combined systolic (congestive) and diastolic (congestive) heart failure J44.9 Chronic obstructive pulmonary disease, unspecified Follow-up Appointments Wound #2 Left,Medial Metatarsal head first Return Appointment in 1 week. Wound #3 Left T Third oe Return Appointment in 1 week. Bathing/ [employer] wounds with antibacterial soap and water. May shower; gently cleanse wound with  antibacterial soap,  rinse and pat dry prior to dressing wounds Off-Loading Open toe surgical shoe Hyperbaric Oxygen Therapy Wound #2 Left,Medial Metatarsal head first Evaluate for HBO Therapy Indication and location: - [place]agoner Grade 3 ulcers of the left foot and 3rd toe If appropriate for treatment, begin HBOT per protocol: 2.0 ATA for 90 Minutes without A Breaks ir One treatment per day (delivered Monday through Friday unless otherwise specified in Special Instructions below): Total # of Treatments: - 40 A ntihistamine 30 minutes prior to HBO Treatment, difficulty clearing ears. Finger stick Blood Glucose Pre- and Post- HBOT Treatment. Follow Hyperbaric Oxygen Glycemia Protocol Wound #3 Left T Third oe Evaluate for HBO Therapy Indication and location: - [place]er Grade 3 ulcers of the left foot and 3rd toe If appropriate for treatment, begin HBOT per protocol: 2.0 ATA for 90 Minutes without A Breaks ir One treatment per day (delivered Monday through Friday unless otherwise specified in Special Instructions below): Total # of Treatments: - 40 A ntihistamine 30 minutes prior to HBO Treatment, difficulty clearing ears. Finger stick Blood Glucose Pre- and Post- HBOT Treatment. Follow Hyperbaric Oxygen Glycemia Protocol Wound Treatment Wound #2 - Metatarsal head first Wound Laterality: Left, Medial Cleanser: Byram Ancillary Kit - 15 Day Supply (Generic) 3 x Per Week/30 Days Discharge Instructions: Use supplies as instructed; Kit contains: (15) Saline Bullets; (15) 3x3 Gauze; 15 pr Gloves Cleanser: Soap and Water 3 x Per Week/30 Days Discharge Instructions: Gently cleanse wound with antibacterial soap, rinse and pat dry prior to dressing wounds Prim Dressing: Iodosorb 40 (g) (Dispense As Written) 3 x Per Week/30 Days ary Discharge Instructions: Apply IodoSorb to wound bed only as directed. Secondary Dressing: Gauze 3 x Per Week/30 Days Discharge Instructions: As directed: dry, moistened  with saline or moistened with Dakins Solution Secured With: Medipore T - 660M Medipore H Soft Cloth Surgical T ape ape, 2x2 (in/yd) (DME) (Generic) 3 x Per Week/30 Days Wound #3 - T Third oe Wound Laterality: Left Cleanser: Soap and Water 3 x Per Week/30 Days Discharge Instructions: Gently cleanse wound with antibacterial soap, rinse and pat dry prior to dressing wounds Prim Dressing: Iodosorb 40 (g) (Dispense As Written) 3 x Per Week/30 Days ary Discharge Instructions: Apply IodoSorb to wound bed only as directed. Secondary Dressing: Gauze 3 x Per Week/30 Days Discharge Instructions: As directed: dry, moistened with saline or moistened with [address] Raymond [name], Raymond [name] ([id number]) [id number] Page 6 of 12 Secured With: Medipore T - 660M Medipore H Soft Cloth Surgical T ape ape, 2x2 (in/yd) 3 x Per Week/30 Days Radiology X-ray, Chest - Screening Chest X-ray for Hyperbaric Oxygen Therapy Treatment - (ICD10 M86.372 - Chronic multifocal osteomyelitis, left ankle and foot) Services and Therapies Electrocardiogram (EKG) - Screening EKG for Hyperbaric Oxygen Therapy Treatment - (ICD10 M86.372 - Chronic multifocal osteomyelitis, left ankle and foot) GLYCEMIA INTERVENTIONS PROTOCOL PRE-HBO GLYCEMIA INTERVENTIONS ACTION INTERVENTION Obtain pre-HBO capillary blood glucose (ensure 1 physician order is in chart). A. Notify HBO physician and await physician orders. 2 If result is 70 mg/dl or below: B. If the result meets the hospital definition of a critical result, follow hospital policy. A. Give patient an 8 ounce Glucerna Shake, an 8 ounce Ensure, or 8 ounces of a Glucerna/Ensure equivalent dietary supplement*. B. Wait 30 minutes. If result is 71 mg/dl to 130 mg/dl: C. Retest patients capillary blood glucose (CBG). [name]. If result greater than or equal to 110 mg/dl, proceed with HBO. If result less than 110 mg/dl, notify HBO physician and consider  holding HBO. If result is 131 mg/dl to 249 mg/dl: A. Proceed with HBO. A. Notify HBO physician and await physician orders. B. It is recommended to hold HBO and do If result is 250 mg/dl or greater: blood/urine ketone testing. C. If the result meets the hospital definition of a critical result, follow hospital policy. POST-HBO GLYCEMIA INTERVENTIONS ACTION INTERVENTION Obtain post HBO capillary blood glucose (ensure 1 physician order is in chart). A. Notify HBO physician and await physician orders. 2 If result is 70 mg/dl or below: B. If the result meets the hospital definition of a critical result, follow hospital policy. A. Give patient an 8 ounce Glucerna Shake, an 8 ounce Ensure, or 8 ounces of a Glucerna/Ensure equivalent dietary supplement*. B. Wait 15 minutes for symptoms of If result is 71 mg/dl to 100 mg/dl: hypoglycemia (i.e. nervousness, anxiety, sweating, chills, clamminess, irritability, confusion, tachycardia or dizziness). C. If patient asymptomatic, discharge patient. If patient symptomatic, repeat capillary blood glucose (CBG) and notify HBO physician. If result is 101 mg/dl to 249 mg/dl: A. Discharge patient. A. Notify HBO physician and await physician orders. B. It is recommended to do blood/urine ketone If result is 250 mg/dl or greater: testing. C. If the result meets the hospital definition of a critical result, follow hospital policy. *Juice or candies are NOT equivalent products. If patient refuses the Glucerna or Ensure, please consult the hospital dietitian for an appropriate substitute. Electronic Signature(s) Signed: 02/10/2022 5:04:17 PM By: [name] PA-C Entered By: [name] on 02/10/2022 17:04:17 Raymond [name] ([id number]) [id number] Page 7 of 12 Prescription 02/10/2022 -------------------------------------------------------------------------------- Leane Call [name]. [name]  PA-C Patient Name: Provider: 1965-02-13 [id number] Date of Birth: NPI#: [name] [id number] Sex: DEA #: [phone] Phone #: License #: [place] [employer] [place] Patient Address: [address] [facility] [place], [place] [address] [address], [place], [place] [address] [phone] Allergies No Known Allergies Provider's Orders X-ray, Chest - ICD10: T70.017 - Screening Chest X-ray for Hyperbaric Oxygen Therapy Treatment Hand Signature: Date(s): Prescription 02/10/2022 Leane Call [name]. [name] PA-C Patient Name: Provider: 10/14/1965 [id number] Date of Birth: NPI#: [name] [id number] Sex: DEA #: [phone] Phone #: License #: [place] [employer] [place] Patient Address: [address] [facility] [place], [place] [address] [address] [place], [place] [address] [phone] Allergies No Known Allergies Provider's Orders Electrocardiogram (EKG) - ICD10: B63.893 - Screening EKG for Hyperbaric Oxygen Therapy Treatment Hand Signature: Date(s): Electronic Signature(s) Signed: 02/10/2022 5:05:26 PM By: [name] PA-C Entered By: [name] on 02/10/2022 17:04:17 Raymond [name] ([id number]) [id number] Page 8 of 12 -------------------------------------------------------------------------------- Problem List Details Patient Name: Date of Service: Raymond [name]. 02/10/2022 9:30 A M Medical Record Number: [id number] Patient Account Number: [id number] Date of Birth/Sex: Treating RN: 04/04/1965 (57 y.o. [name] Primary Care Provider: [name] Other Clinician: Referring Provider: Treating Provider/Extender: [name] Weeks in Treatment: 5 Active Problems ICD-10 Encounter Code Description Active Date MDM Diagnosis [phone] Chronic multifocal osteomyelitis, left ankle  and foot 02/10/2022 No Yes E11.621 Type 2 diabetes mellitus with foot ulcer 01/03/2022 No Yes L97.522 Non-pressure chronic ulcer of other part of left foot with fat layer exposed 01/03/2022 No Yes I50.42 Chronic combined systolic (congestive) and diastolic (congestive) heart failure 01/03/2022 No Yes J44.9 Chronic obstructive pulmonary disease, unspecified 01/03/2022 No Yes Inactive Problems Resolved Problems Electronic Signature(s) Signed: 02/10/2022 4:57:12 PM By:  [name] PA-C Previous Signature: 02/10/2022 10:06:23 AM Version By: [name] PA-C Entered By: [name] on 02/10/2022 16:57:12 -------------------------------------------------------------------------------- Progress Note Details Patient Name: Date of Service: Raymond [name]. 02/10/2022 9:30 A M Medical Record Number: [id number] Patient Account Number: [id number] Date of Birth/Sex: Treating RN: 1965/05/03 (57 y.o. Devyon, Keator, Lendon Ka ([id number]) [id number].pdf Page 9 of 12 Primary Care Provider: [name] Other Clinician: Referring Provider: Treating Provider/Extender: Hazle Coca in Treatment: 5 Subjective Chief Complaint Information obtained from Patient Left foot ulcer with chronic osteomyelitis History of Present Illness (HPI) 09/05/17-He is seeing an initial evaluation for a left plantar foot ulcer. He has a remote history of left great toe amputation. He states that 4-6 weeks ago he noted callus formation and ulceration. He has not seen primary care regarding this. He is not currently on antibiotic therapy. He does not routinely follow with podiatry. He states diabetic foot wear will arrive early next week. The EHR shows an A1c of 9% approximate 4 months ago but he states he had one and primary care a few weeks ago but does not know the results. He is neuropathic and does not complain of any pain, he is currently wearing  crocs. 09/12/17-he is here in follow up evaluation for left plantar foot ulcer. There is improvement in both appearance and measurement. We will continue with same treatment plan and he will follow up next week. 09/19/17 on evaluation today patient actually appears to be doing excellent in regard to his ulcer on the invitation site of his left great foot plantar aspect. He's been tolerating the dressing changes without complication. In fact with the Prisma and the current measures he has been shown signs of excellent improvement week by week up to this point. We have been to breeding the wound in this seems to have been of great benefit for him. Fortunately there is no evidence of infection. 09/26/17 on evaluation today patient appears to be doing rather well in regard to his wound. He did not know quite as much improvement this week as compared to last week. Nonetheless he still continues to show signs of improving to some degree. I do believe he may benefit from an offloading shoe. No fevers, chills, nausea, or vomiting noted at this time. 10/03/17 on evaluation today patient actually appears to be doing much better in regard to the amputation site plantar foot ulcer. Overall this appears significantly smaller even compared to previous. He's been tolerating the dressing changes without complication. He did get his diabetic shoes and I did have a look at them they appear to be fairly good. Nonetheless I do think that for the time being I would probably recommend he continue with the offloading shoe that we have been utilizing just due to the fact that with the dressing I don't know that his diabetic shoes are going to work as appropriately as far as not called an additional pressure and irritation to the area in question. He understands. 10/10/17 on evaluation today patient actually appears to be doing very well in regard to his plantar foot ulcer. He has been tolerating the dressing changes without  complication. I do feel like he's making signs of good improvement and in fact of the wound bed appears to be better although it may not be significantly changed in size it appears healthier and I do believe is showing signs of improving. Nonetheless we gonna keep working towards healing as far  as that is concerned. There is no evidence of infection. 10/17/17 on evaluation today patient actually appears to be doing poorly in regard to his plantar foot ulcer. Unfortunately other than just the area where the wound is itself there appears to be a blister that communicates with the wound unfortunately. This is more lateral to the wound itself and also to the distal point of the amputation site. There does not appear to be any evidence of cellulitis spreading at the foot but I do believe some of the drainage from the site itself is. When in nature. Nonetheless this blister area I think needs to be removed in order to allow for appropriate healing of the ulcer itself I think that is gonna be difficult for it to heal otherwise. This was discussed with the patient today. 10/24/17 on evaluation today patient actually appears to be doing better compared to last week even post debridement. He has shown signs of improvement he did test positive for Staphylococcus aureus. With that being said he notes that he's not have any discomfort and in general he does feel like things seem to been doing better. Fortunately there's no additional blistering and no evidence of remaining infection at this time. No fevers, chills, nausea, or vomiting noted at this time. He has three days of antibiotic left. 10/31/17 on evaluation today patient actually appears to be doing very well in regard to his ulcer on the left foot. He has been tolerating the dressing changes without complication. With that being said fortunately there appears to be no evidence of infection I do think he's made progress compared to previous. No fevers, chills,  nausea, or vomiting noted at this time. 11/14/17 on evaluation today patient actually appears to be doing excellent in regard to his amputation site ulcer on his foot. In fact this appears to be completely healed at this point. There is no evidence of infection nor underline abscess and I did thoroughly evaluate the periwound location. Overall I'm very pleased with how things appear. Readmission: 01-03-2022 upon evaluation today patient appears for reevaluation here in the clinic although it has been since 2019 that I last saw him. This again has been over 4 years. With that being said he is having an issue with a wound on his foot unfortunately. He has not had any recent x-rays he tells me at this point which is unfortunate as avoid definitely can need to get something started that can be one of the primary items to get moving forward with. With that being said I also think were probably can obtain a wound culture he is probably going to require some antibiotics at some point. I just want to make sure that we have him on the right thing when we do this. Currently the patient tells me that his wounds have been present in regard to the met head for about a month at this point. With that being said he also has a wound on the third toe which is the next toe this actually still present. This unfortunately has a lot of callus buildup around it but I think it is larger underneath than what it would appear on initial inspection. Patient does have a history of diabetes mellitus type 2, congestive heart failure, and COPD. 01-11-2022 upon evaluation patient's wounds actually are showing signs of doing about the same may be just slightly cleaner but definitely not where we want things to be as of yet. Fortunately there does not appear to be any signs  of active infection locally nor systemically at this point which is great news. No fevers, chills, nausea, vomiting, or diarrhea. Of note patient has had his MRI  but we do not have the results of that as of yet we are still waiting for the reading on this at this point. 01-18-2022 upon evaluation today patient presents after having had his MRI finally complete. 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 The third and fourth digits seem to be early which is good news. Nonetheless we are still going to need to try to see about getting the infection under control he is already been on the antibiotic therapy which I think has done quite well. In fact I feel like he is showing signs of improvement already. 12/28; patient with a wound on the medial aspect of the left first metatarsal head and an area on the left third toe with slight hammer deformity. He has had a previous left first toe amputation. We are using Iodoflex on the wound. He is a diabetic a recent MRI showed osteomyelitis we have been giving him Cipro and Doxy which he appears to be tolerating well. 02-10-2022 upon evaluation today patient unfortunately has issues still with open wounds of his foot on the left. He does have known osteomyelitis at this point and that is something that we have been treating with oral antibiotics. I actually have not seen him personally since 19 December. In that time he has not had any debridement from that point until today based on what I can see as best I can tell anyway. Has been on Cipro as well as doxycycline. Nonetheless he does still seem to have open wounds today and I think that he would benefit from hyperbaric oxygen therapy. I discussed that with him today as well. Raymond [name], Raymond [name] ([id number]) [id number] Page 10 of 12 Objective Constitutional Well-nourished and well-hydrated in no acute distress. Vitals Time Taken: 9:38 AM, Height: 74 in, Weight: 371 lbs, BMI: 47.6, Temperature: 97.9 F, Pulse: 66 bpm, Respiratory Rate: 16 breaths/min, Blood  Pressure: 171/80 mmHg. Respiratory normal breathing without difficulty. General Notes: Upon inspection patient's wounds actually showed signs of a lot of callus and buildup. Once I remove the callus on both the toe as well as the first metatarsal area the wound really is not significantly smaller compared to previous. I discussed with the patient that I really do think he would benefit from 8 hyperbaric oxygen therapy. The biggest issue here is getting this approved and making sure that he has the ability to make it here as far as transportation is concerned. Integumentary (Hair, Skin) Wound #2 status is Open. Original cause of wound was Gradually Appeared. The date acquired was: 12/13/2021. The wound has been in treatment 5 weeks. The wound is located on the Left,Medial Metatarsal head first. The wound measures 1cm length x 0.7cm width x 0.1cm depth; 0.55cm^2 area and 0.055cm^3 volume. There is Fat Layer (Subcutaneous Tissue) exposed. There is a medium amount of serous drainage noted. The wound margin is flat and intact. There is no granulation within the wound bed. There is a large (67-100%) amount of necrotic tissue within the wound bed including Adherent Slough. Wound #3 status is Open. Original cause of wound was Footwear Injury. The date acquired was: 01/03/2022. The wound has been in treatment 5 weeks. The wound is located on the Left T Third. The wound measures 1cm length x 0.8cm width x  0.1cm depth; 0.628cm^2 area and 0.063cm^3 volume. There is Fat oe Layer (Subcutaneous Tissue) exposed. There is a medium amount of serous drainage noted. The wound margin is flat and intact. There is no granulation within the wound bed. There is a large (67-100%) amount of necrotic tissue within the wound bed. 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 There was a Selective/Open Wound Non-Viable Tissue Debridement with a total area of 0.7 sq cm performed by Tommie Sams., PA-C. With the following instrument(s): Curette to remove Viable and Non-Viable tissue/material. Material removed includes Callus. No specimens were taken.A Minimum amount of bleeding was controlled with Pressure. The procedure was tolerated well. Post Debridement Measurements: 1cm length x 0.7cm width x 0.2cm depth; 0.11cm^3 volume. Character of Wound/Ulcer Post Debridement is stable. Severity of Tissue Post Debridement is: Fat layer exposed. Post procedure Diagnosis Wound #2: Same as Pre-Procedure Wound #3 Pre-procedure diagnosis of Wound #3 is a Diabetic Wound/Ulcer of the Lower Extremity located on the Left T Third .Severity of Tissue Pre Debridement is: oe Fat layer exposed. There was a Selective/Open Wound Non-Viable Tissue Debridement with a total area of 0.8 sq cm performed by Tommie Sams., PA-C. With the following instrument(s): Curette to remove Viable and Non-Viable tissue/material. Material removed includes Callus. No specimens were taken.A Minimum amount of bleeding was controlled with Pressure. The procedure was tolerated well. Post Debridement Measurements: 1cm length x 0.8cm width x 0.2cm depth; 0.126cm^3 volume. Character of Wound/Ulcer Post Debridement is stable. Severity of Tissue Post Debridement is: Fat layer exposed. Post procedure Diagnosis Wound #3: Same as Pre-Procedure Plan Follow-up Appointments: Wound #2 Left,Medial Metatarsal head first: Return Appointment in 1 week. Raymond [name], Raymond [name]  ([id number]) [id number] Page 11 of 12 Wound #3 Left T Third: oe Return Appointment in 1 week. Bathing/ Shower/ Hygiene: Wash wounds with antibacterial soap and water. May shower; gently cleanse wound with antibacterial soap, rinse and pat dry prior to dressing wounds Off-Loading: Open toe surgical shoe Hyperbaric Oxygen Therapy: Wound #2 Left,Medial Metatarsal head first: Evaluate for HBO Therapy Indication and location: - [place] Grade 3 ulcers of the left foot and 3rd toe If appropriate for treatment, begin HBOT per protocol: 2.0 ATA for 90 Minutes without Air Breaks One treatment per day (delivered Monday through Friday unless otherwise specified in Special Instructions below): T # of Treatments: - 40 otal Antihistamine 30 minutes prior to HBO Treatment, difficulty clearing ears. Finger stick Blood Glucose Pre- and Post- HBOT Treatment. Follow Hyperbaric Oxygen Glycemia Protocol Wound #3 Left T Third: oe Evaluate for HBO Therapy Indication and location: - [place] Grade 3 ulcers of the left foot and 3rd toe If appropriate for treatment, begin HBOT per protocol: 2.0 ATA for 90 Minutes without Air Breaks One treatment per day (delivered Monday through Friday unless otherwise specified in Special Instructions below): T # of Treatments: - 40 otal Antihistamine 30 minutes prior to HBO Treatment, difficulty clearing ears. Finger stick Blood Glucose Pre- and Post- HBOT Treatment. Follow Hyperbaric Oxygen Glycemia Protocol Radiology ordered were: X-ray, Chest - Screening Chest X-ray for Hyperbaric Oxygen Therapy Treatment  Services and Therapies ordered were: Electrocardiogram (EKG) - Screening EKG for Hyperbaric Oxygen Therapy Treatment WOUND #2: - Metatarsal head first Wound Laterality: Left, Medial Cleanser: Byram Ancillary Kit - 15 Day Supply (Generic) 3 x Per Week/30 Days Discharge Instructions: Use supplies as instructed; Kit contains: (15) Saline  Bullets; (15) 3x3 Gauze; 15 pr Gloves Cleanser: Soap and Water 3 x Per Week/30 Days Discharge Instructions: Gently cleanse wound with antibacterial soap, rinse and pat dry prior to dressing wounds Prim Dressing: Iodosorb 40 (g) (Dispense As Written) 3 x Per Week/30 Days ary Discharge Instructions: Apply IodoSorb to wound bed only as directed. Secondary Dressing: Gauze 3 x Per Week/30 Days Discharge Instructions: As directed: dry, moistened with saline or moistened with Dakins Solution Secured With: Medipore T - 82M Medipore H Soft Cloth Surgical T ape ape, 2x2 (in/yd) (DME) (Generic) 3 x Per Week/30 Days WOUND #3: - T Third Wound Laterality: Left oe Cleanser: Soap and Water 3 x Per Week/30 Days Discharge Instructions: Gently cleanse wound with antibacterial soap, rinse and pat dry prior to dressing wounds Prim Dressing: Iodosorb 40 (g) (Dispense As Written) 3 x Per Week/30 Days ary Discharge Instructions: Apply IodoSorb to wound bed only as directed. Secondary Dressing: Gauze 3 x Per Week/30 Days Discharge Instructions: As directed: dry, moistened with saline or moistened with Dakins Solution Secured With: Medipore T - 82M Medipore H Soft Cloth Surgical T ape ape, 2x2 (in/yd) 3 x Per Week/30 Days 1. Based on what I am seeing I do believe that the patient would benefit currently from going ahead and proceeding with the HBO therapy. I am going to see what we can do about getting approval in that regard. I think this could be beneficial in further limb salvage she is already had amputations of the great toe and again we do not want this to proceed to further transmetatarsal or even worse amputation. The patient voiced understanding and he is in agreement with this plan. 2. I am also can recommend that the patient should continue to monitor for any signs of infection or worsening. Obviously based on what we are seeing I believe that he is definitely moving in the right direction here. But at the  same time I think he could be benefited by continuing with the oral antibiotics as well as seeing about getting HBO therapy approved and initiated as quickly as possible. The patient voiced understanding and is in agreement with that plan. We will see patient back for reevaluation in 1 week here in the clinic. If anything worsens or changes patient will contact our office for additional recommendations. Along with hyperbarics we would obviously continue with good and appropriate wound care including weekly follow-up visits as well as continue with the oral antibiotics at this point both of which I think are still going to be absolutely necessary. Electronic Signature(s) Signed: 02/10/2022 5:04:48 PM By: [name] PA-C Entered By: [name] on 02/10/2022 17:04:47 Raymond [name] ([id number]) [id number] Page 12 of 12 -------------------------------------------------------------------------------- SuperBill Details Patient Name: Date of Service: Raymond [name] 02/10/2022 Medical Record Number: [id number] Patient Account Number: [id number] Date of Birth/Sex: Treating RN: July 24, 1965 (57 y.o. [name] Primary Care Provider: [name] Other Clinician: Referring Provider: Treating Provider/Extender: [name] Weeks in Treatment: 5 Diagnosis Coding ICD-10 Codes Code Description [phone] Chronic multifocal osteomyelitis, left ankle and foot E11.621 Type 2 diabetes mellitus with foot ulcer L97.522 Non-pressure chronic ulcer of other part of left foot with  fat layer exposed I50.42 Chronic combined systolic (congestive) and diastolic (congestive) heart failure J44.9 Chronic obstructive pulmonary disease, unspecified Facility Procedures : CPT4 Code: [id number] Description: [phone] - DEBRIDE WOUND 1ST 20 SQ CM OR < ICD-10 Diagnosis Description L97.522 Non-pressure chronic ulcer of other part of left foot with fat layer  exposed Modifier: Quantity: 1 Physician Procedures : CPT4 Code Description Modifier [id number] - WC PHYS LEVEL 4 - EST PT 25 ICD-10 Diagnosis Description M86.372 Chronic multifocal osteomyelitis, left ankle and foot E11.621 Type 2 diabetes mellitus with foot ulcer L97.522 Non-pressure chronic ulcer of  other part of left foot with fat layer exposed I50.42 Chronic combined systolic (congestive) and diastolic (congestive) heart failure Quantity: 1 : [id number] - WC PHYS DEBR WO ANESTH 20 SQ CM ICD-10 Diagnosis Description L97.522 Non-pressure chronic ulcer of other part of left foot with fat layer exposed Quantity: 1 Electronic Signature(s) Signed: 02/10/2022 5:05:17 PM By: [name] PA-C Entered By: [name] on 02/10/2022 17:05:17

## 2022-02-11 NOTE — Progress Notes (Signed)
GERALDINE, TESAR ([id number]) [id number] Page 1 of 1 Visit Report for 02/10/2022 HBO Patient Questionnaire Details Patient Name: Raymond [name]. Date of Service: 02/10/2022 9:30 A M Medical Record Number: [id number] Patient Account Number: [id number] Date of Birth/Sex: Treating RN: [birth year] (57 y.o. [name] Primary Care Ajanay Farve: [name] Other Clinician: Referring Tanaya Dunigan: Treating Katrisha Segall/Extender: [name] Weeks in Treatment: 5 HBO Patient Questionnaire Items Answer A "Yes" answers must be brought to the hyperbaric physician's attention. ny Breathing or Lung problemso No Currently use tobacco productso No Used tobacco products in the pasto No Heart problemso Yes Seen a doctor for any heart or blood pressure problemso Yes Do you take water pills (diuretic)o Yes Diabeteso Yes On Diabetes pillo Yes If yes, last time taken: 02/09/2022 On Insulino Yes If yes, last time taken: 02/09/2022 Dialysiso No Ear problems or surgeryo No Sinus Problemso No Cancero No Confinement Anxiety (Claustrophobia- fear of confined places)o No Any medical implants/devices that are fully or partially implanted or attached to your bodyo No Pregnanto No Seizureso No Electronic Signature(s) Signed: 02/10/2022 5:09:02 PM By: Rosalio Loud MSN RN CNS WTA Entered By: Rosalio Loud on 02/10/2022 10:30:39

## 2022-02-11 NOTE — Progress Notes (Signed)
LANE, ELAND ([id number]) [id number] Page 1 of 11 Visit Report for 02/10/2022 Arrival Information Details Patient Name: Date of Service: Raymond [name] 02/10/2022 9:30 A M Medical Record Number: [id number] Patient Account Number: [id number] Date of Birth/Sex: Treating RN: 1965/05/15 (57 y.o. [name] Primary Care Raveen Wieseler: [name] Other Clinician: Referring Teja Judice: Treating Agnieszka Newhouse/Extender: Hazle Coca in Treatment: 5 Visit Information History Since Last Visit Added or deleted any medications: No Patient Arrived: Ambulatory Any new allergies or adverse reactions: No Arrival Time: 09:36 Had a fall or experienced change in Yes Accompanied By: self activities of daily living that may affect Transfer Assistance: None risk of falls: Patient Identification Verified: Yes Hospitalized since last visit: No Secondary Verification Process Completed: Yes Has Dressing in Place as Prescribed: Yes Patient Requires Transmission-Based Precautions: No Pain Present Now: No Patient Has Alerts: Yes Patient Alerts: Type II Diabetic Aspirin '81mg'$  ABI L Grundy>220 Electronic Signature(s) Signed: 02/10/2022 5:09:02 PM By: Rosalio Loud MSN RN CNS WTA Entered By: Rosalio Loud on 02/10/2022 09:38:50 -------------------------------------------------------------------------------- Clinic Level of Care Assessment Details Patient Name: Date of Service: Raymond Eke [name]. 02/10/2022 9:30 A M Medical Record Number: [id number] Patient Account Number: [id number] Date of Birth/Sex: Treating RN: 1965/06/02 (57 y.o. [name] Primary Care Tibor Lemmons: [name] Other Clinician: Referring Mitchael Luckey: Treating Deziray Nabi/Extender: Hazle Coca in Treatment: 5 Clinic Level of Care Assessment Items TOOL 1 Quantity Score '[]'$  - 0 Use when EandM and Procedure is performed on INITIAL visit ASSESSMENTS -  Nursing Assessment / Reassessment '[]'$  - 0 General Physical Exam (combine w/ comprehensive assessment (listed just below) when performed on new pt. evals) '[]'$  - 0 Comprehensive Assessment (HX, ROS, Risk Assessments, Wounds Hx, etc.) ASSESSMENTS - Wound and Skin Assessment / Reassessment '[]'$  - 0 Dermatologic / Skin Assessment (not related to wound area) Raymond [name], Raymond [name] ([id number]) [id number] Page 2 of 11 ASSESSMENTS - Ostomy and/or Continence Assessment and Care '[]'$  - 0 Incontinence Assessment and Management '[]'$  - 0 Ostomy Care Assessment and Management (repouching, etc.) PROCESS - Coordination of Care '[]'$  - 0 Simple Patient / Family Education for ongoing care '[]'$  - 0 Complex (extensive) Patient / Family Education for ongoing care '[]'$  - 0 Staff obtains Programmer, systems, Records, T Results / Process Orders est '[]'$  - 0 Staff telephones HHA, Nursing Homes / Clarify orders / etc '[]'$  - 0 Routine Transfer to another Facility (non-emergent condition) '[]'$  - 0 Routine Hospital Admission (non-emergent condition) '[]'$  - 0 New Admissions / Biomedical engineer / Ordering NPWT Apligraf, etc. , '[]'$  - 0 Emergency Hospital Admission (emergent condition) PROCESS - Special Needs '[]'$  - 0 Pediatric / Minor Patient Management '[]'$  - 0 Isolation Patient Management '[]'$  - 0 Hearing / Language / Visual special needs '[]'$  - 0 Assessment of Community assistance (transportation, [name]/C planning, etc.) '[]'$  - 0 Additional assistance / Altered mentation '[]'$  - 0 Support Surface(s) Assessment (bed, cushion, seat, etc.) INTERVENTIONS - Miscellaneous '[]'$  - 0 External ear exam '[]'$  - 0 Patient Transfer (multiple staff / Civil Service fast streamer / Similar devices) '[]'$  - 0 Simple Staple / Suture removal (25 or less) '[]'$  - 0 Complex Staple / Suture removal (26 or more) '[]'$  - 0 Hypo/Hyperglycemic Management (do not check if billed separately) '[]'$  - 0 Ankle / Brachial Index (ABI) - do not check if billed  separately Has the patient been seen at the hospital within the last three years: Yes Total Score: 0 Level Of Care: ____ Electronic Signature(s)  Signed: 02/10/2022 5:09:02 PM By: Rosalio Loud MSN RN CNS WTA Entered By: Rosalio Loud on 02/10/2022 10:35:09 -------------------------------------------------------------------------------- Encounter Discharge Information Details Patient Name: Date of Service: Raymond [name], Raymond Negus [name]. 02/10/2022 9:30 A M Medical Record Number: [id number] Patient Account Number: [id number] Date of Birth/Sex: Treating RN: [birth year] (57 y.o. [name] Primary Care Rondale Nies: [name] Other Clinician: Referring Deashia Soule: Treating Kaceton Vieau/Extender: Hazle Coca in Treatment: 5 Encounter Discharge Information Items Post Procedure Vitals SHOOTER, TANGEN ([id number]) [id number] Page 3 of 11 Discharge Condition: Stable Temperature (F): 97.9 Ambulatory Status: Ambulatory Pulse (bpm): 66 Discharge Destination: Home Respiratory Rate (breaths/min): 18 Transportation: Private Auto Blood Pressure (mmHg): 171/80 Accompanied By: self Schedule Follow-up Appointment: Yes Clinical Summary of Care: Electronic Signature(s) Signed: 02/10/2022 5:09:02 PM By: Rosalio Loud MSN RN CNS WTA Entered By: Rosalio Loud on 02/10/2022 10:36:00 -------------------------------------------------------------------------------- Lower Extremity Assessment Details Patient Name: Date of Service: Raymond Eke [name]. 02/10/2022 9:30 A M Medical Record Number: [id number] Patient Account Number: [id number] Date of Birth/Sex: Treating RN: Jun 16, 1965 (57 y.o. [name] Primary Care Astria Jordahl: [name] Other Clinician: Referring Zedric Deroy: Treating Atheena Spano/Extender: [name] Weeks in Treatment: 5 Electronic Signature(s) Signed: 02/10/2022 5:09:02 PM By: Rosalio Loud MSN RN CNS WTA Entered By:  Rosalio Loud on 02/10/2022 09:48:49 -------------------------------------------------------------------------------- Multi Wound Chart Details Patient Name: Date of Service: Raymond Eke [name]. 02/10/2022 9:30 A M Medical Record Number: [id number] Patient Account Number: [id number] Date of Birth/Sex: Treating RN: January 15, 1966 (57 y.o. [name] Primary Care [place] Dann: [name] Other Clinician: Referring Jane Broughton: Treating Jaisean Monteforte/Extender: [name] Weeks in Treatment: 5 Vital Signs Height(in): 74 Pulse(bpm): 66 Weight(lbs): 924 Blood Pressure(mmHg): 171/80 Body Mass Index(BMI): 47.6 Temperature(F): 97.9 Respiratory Rate(breaths/min): 16 [2:Photos:] [N/A:N/A [id number] Page 4 of 11] Left, Medial Metatarsal head first Left T Third oe N/A Wound Location: Gradually Appeared Footwear Injury N/A Wounding Event: Diabetic Wound/Ulcer of the Lower Diabetic Wound/Ulcer of the Lower N/A Primary Etiology: Extremity Extremity Congestive Heart Failure, Congestive Heart Failure, N/A Comorbid History: Hypertension, Type II Diabetes, Hypertension, Type II Diabetes, Neuropathy Neuropathy 12/13/2021 01/03/2022 N/A Date Acquired: 5 5 N/A Weeks of Treatment: Open Open N/A Wound Status: No No N/A Wound Recurrence: 1x0.7x0.1 1x0.8x0.1 N/A Measurements L x W x [name] (cm) 0.55 0.628 N/A A (cm) : rea 0.055 0.063 N/A Volume (cm) : 56.80% 1.30% N/A % Reduction in A rea: 85.60% 50.40% N/A % Reduction in Volume: Grade 2 Grade 1 N/A Classification: Medium Medium N/A Exudate A mount: Serous Serous N/A Exudate Type: amber amber N/A Exudate Color: Flat and Intact Flat and Intact N/A Wound Margin: None Present (0%) None Present (0%) N/A Granulation A mount: Large (67-100%) Large (67-100%) N/A Necrotic A mount: Fat Layer (Subcutaneous Tissue): Yes Fat Layer (Subcutaneous Tissue): Yes N/A Exposed Structures: Fascia:  No Fascia: No Tendon: No Tendon: No Muscle: No Muscle: No Joint: No Joint: No Bone: No Bone: No None None N/A Epithelialization: Treatment Notes Electronic Signature(s) Signed: 02/10/2022 5:09:02 PM By: Rosalio Loud MSN RN CNS WTA Entered By: Rosalio Loud on 02/10/2022 09:49:21 -------------------------------------------------------------------------------- Multi-Disciplinary Care Plan Details Patient Name: Date of Service: Raymond [name], Raymond Negus [name]. 02/10/2022 9:30 A M Medical Record Number: [id number] Patient Account Number: [id number] Date of Birth/Sex: Treating RN: [birth year] (57 y.o. [name] Primary Care Allin Frix: [name] Other Clinician: Referring Mileidy Atkin: Treating Cloyce Paterson/Extender: [name] Weeks in Treatment: 5 Active Inactive Medication Nursing Diagnoses: Knowledge deficit related to medication safety: actual  or potential Goals: Patient/caregiver will demonstrate understanding of all current medications Date Initiated: 01/03/2022 T arget Resolution Date: 01/03/2022 WYAT, INFINGER ([id number]) [id number].pdf Page 5 of 11 Goal Status: Active Interventions: Assess for medication contraindications each visit where new medications are prescribed Assess patient/caregiver ability to manage medication regimen upon admission and as needed Patient/Caregiver given reconciled medication list upon admission, changes in medications and discharge from the [place] Provide education on medication safety Notes: Necrotic Tissue Nursing Diagnoses: Impaired tissue integrity related to necrotic/devitalized tissue Knowledge deficit related to management of necrotic/devitalized tissue Goals: Necrotic/devitalized tissue will be minimized in the wound bed Date Initiated: 01/03/2022 Target Resolution Date: 01/03/2022 Goal Status: Active Patient/caregiver will verbalize understanding of reason and process for debridement  of necrotic tissue Date Initiated: 01/03/2022 Target Resolution Date: 01/03/2022 Goal Status: Active Interventions: Assess patient pain level pre-, during and post procedure and prior to discharge Provide education on necrotic tissue and debridement process Treatment Activities: Excisional debridement : 01/03/2022 Notes: Pressure Nursing Diagnoses: Knowledge deficit related to causes and risk factors for pressure ulcer development Knowledge deficit related to management of pressures ulcers Potential for impaired tissue integrity related to pressure, friction, moisture, and shear Goals: Patient will remain free from development of additional pressure ulcers Date Initiated: 01/03/2022 Target Resolution Date: 01/03/2022 Goal Status: Active Patient/caregiver will verbalize risk factors for pressure ulcer development Date Initiated: 01/03/2022 Target Resolution Date: 01/03/2022 Goal Status: Active Patient/caregiver will verbalize understanding of pressure ulcer management Date Initiated: 01/03/2022 Target Resolution Date: 01/03/2022 Goal Status: Active Interventions: Assess potential for pressure ulcer upon admission and as needed Notes: Soft Tissue Infection Nursing Diagnoses: Impaired tissue integrity Knowledge deficit related to disease process and management Knowledge deficit related to home infection control: handwashing, handling of soiled dressings, supply storage Potential for infection: soft tissue Goals: Patient will remain free of wound infection Date Initiated: 01/03/2022 Target Resolution Date: 01/03/2022 Goal Status: Active Patient/caregiver will verbalize understanding of or measures to prevent infection and contamination in the home setting Date Initiated: 01/03/2022 Target Resolution Date: 01/03/2022 Goal Status: Active Patient's soft tissue infection will resolve Date Initiated: 01/03/2022 Target Resolution Date: 01/03/2022 Goal Status: Active Signs and symptoms of  infection will be recognized early to allow for prompt treatment Date Initiated: 01/03/2022 Target Resolution Date: 01/03/2022 Goal Status: Active Raymond [name], Raymond [name] ([id number]) [phone].pdf Page 6 of 11 Interventions: Assess signs and symptoms of infection every visit Treatment Activities: Culture and sensitivity : 01/03/2022 Notes: Wound/Skin Impairment Nursing Diagnoses: Impaired tissue integrity Knowledge deficit related to smoking impact on wound healing Knowledge deficit related to ulceration/compromised skin integrity Goals: Patient/caregiver will verbalize understanding of skin care regimen Date Initiated: 01/03/2022 Target Resolution Date: 01/03/2022 Goal Status: Active Ulcer/skin breakdown will have a volume reduction of 30% by week 4 Date Initiated: 01/03/2022 Target Resolution Date: 01/31/2022 Goal Status: Active Interventions: Assess patient/caregiver ability to obtain necessary supplies Assess patient/caregiver ability to perform ulcer/skin care regimen upon admission and as needed Assess ulceration(s) every visit Notes: Electronic Signature(s) Signed: 02/10/2022 5:09:02 PM By: Rosalio Loud MSN RN CNS WTA Entered By: Rosalio Loud on 02/10/2022 10:28:01 -------------------------------------------------------------------------------- Pain Assessment Details Patient Name: Date of Service: Raymond Eke [name]. 02/10/2022 9:30 A M Medical Record Number: [id number] Patient Account Number: [id number] Date of Birth/Sex: Treating RN: [birth year] (57 y.o. [name] Primary Care Wynette [place]: [name] Other Clinician: Referring Raylen Ken: Treating Eliza Green/Extender: [name] Weeks in Treatment: 5 Active Problems Location of Pain Severity and Description of Pain Patient Has Paino No  [address] [place] [name] ([id number]) [id number] Page 7 of 11 Pain Management and  Medication Current Pain Management: Electronic Signature(s) Signed: 02/10/2022 5:09:02 PM By: Rosalio Loud MSN RN CNS WTA Entered By: Rosalio Loud on 02/10/2022 09:42:03 -------------------------------------------------------------------------------- Patient/Caregiver Education Details Patient Name: Date of Service: Raymond [name]. 1/11/2024andnbsp9:30 [place] Record Number: [id number] Patient Account Number: [id number] Date of Birth/Gender: Treating RN: 07/28/1965 (57 y.o. [name] Primary Care Physician: [name] Other Clinician: Referring Physician: Treating Physician/Extender: Hazle Coca in Treatment: 5 Education Assessment Education Provided To: Patient Education Topics Provided Wound Debridement: Handouts: Wound Debridement Methods: Explain/Verbal Responses: State content correctly Wound/Skin Impairment: Handouts: Caring for Your Ulcer Methods: Explain/Verbal Responses: State content correctly Electronic Signature(s) Signed: 02/10/2022 5:09:02 PM By: Rosalio Loud MSN RN CNS WTA Entered By: Rosalio Loud on 02/10/2022 10:27:55 Raymond [name] ([id number]) [id number] Page 8 of 11 -------------------------------------------------------------------------------- Wound Assessment Details Patient Name: Date of Service: Raymond [name]. 02/10/2022 9:30 A M Medical Record Number: [id number] Patient Account Number: [id number] Date of Birth/Sex: Treating RN: 02-Jun-1965 (57 y.o. [name] Primary Care Shelah Heatley: [name] Other Clinician: Referring Dameon Soltis: Treating Fermina Mishkin/Extender: [name] Weeks in Treatment: 5 Wound Status Wound Number: 2 Primary Diabetic Wound/Ulcer of the Lower Extremity Etiology: Wound Location: Left, Medial Metatarsal head first Wound Status: Open Wounding Event: Gradually Appeared Comorbid Congestive Heart Failure, Hypertension,  Type II Diabetes, Date Acquired: 12/13/2021 History: Neuropathy Weeks Of Treatment: 5 Clustered Wound: No Photos Wound Measurements Length: (cm) 1 Width: (cm) 0.7 Depth: (cm) 0.1 Area: (cm) 0.55 Volume: (cm) 0.055 % Reduction in Area: 56.8% % Reduction in Volume: 85.6% Epithelialization: None Wound Description Classification: Grade 2 Wound Margin: Flat and Intact Exudate Amount: Medium Exudate Type: Serous Exudate Color: amber Foul Odor After Cleansing: No Slough/Fibrino Yes Wound Bed Granulation Amount: None Present (0%) Exposed Structure Necrotic Amount: Large (67-100%) Fascia Exposed: No Necrotic Quality: Adherent Slough Fat Layer (Subcutaneous Tissue) Exposed: Yes Tendon Exposed: No Muscle Exposed: No Joint Exposed: No Bone Exposed: No Treatment Notes Wound #2 (Metatarsal head first) Wound Laterality: Left, Medial Cleanser [place] Ancillary Kit - 15 Day Supply Discharge Instruction: Use supplies as instructed; Kit contains: (15) Saline Bullets; (15) 3x3 Gauze; 15 pr Gloves Raymond [name] ([id number]) [id number] Page 9 of 11 Soap and Water Discharge Instruction: Gently cleanse wound with antibacterial soap, rinse and pat dry prior to dressing wounds Peri-Wound Care Topical Primary Dressing Iodosorb 40 (g) Discharge Instruction: Apply IodoSorb to wound bed only as directed. Secondary Dressing Gauze Discharge Instruction: As directed: dry, moistened with saline or moistened with Dakins Solution Secured With [place] H Soft Cloth Surgical T ape ape, 2x2 (in/yd) Compression Wrap Compression Stockings Add-Ons Electronic Signature(s) Signed: 02/10/2022 5:09:02 PM By: Rosalio Loud MSN RN CNS WTA Entered By: Rosalio Loud on 02/10/2022 09:48:37 -------------------------------------------------------------------------------- Wound Assessment Details Patient Name: Date of Service: Raymond Eke [name]. 02/10/2022  9:30 A M Medical Record Number: [id number] Patient Account Number: [id number] Date of Birth/Sex: Treating RN: 1965/11/25 (57 y.o. [name] Primary Care Carey Johndrow: [name] Other Clinician: Referring Daquane Aguilar: Treating Onesti Bonfiglio/Extender: [name] Weeks in Treatment: 5 Wound Status Wound Number: 3 Primary Diabetic Wound/Ulcer of the Lower Extremity Etiology: Wound Location: Left T Third oe Wound Status: Open Wounding Event: Footwear Injury Comorbid Congestive Heart Failure, Hypertension, Type II Diabetes, Date Acquired: 01/03/2022 History: Neuropathy Weeks Of Treatment: 5 Clustered Wound: No Photos Wound Measurements Length: (  cm) 1 Width: (cm) 0.8 Depth: (cm) 0.1 Raymond [name], Raymond [name] ([id number]) Area: (cm) 0.628 Volume: (cm) 0.063 % Reduction in Area: 1.3% % Reduction in Volume: 50.4% Epithelialization: None [id number] Page 10 of 11 Wound Description Classification: Grade 1 Wound Margin: Flat and Intact Exudate Amount: Medium Exudate Type: Serous Exudate Color: amber Foul Odor After Cleansing: No Slough/Fibrino Yes Wound Bed Granulation Amount: None Present (0%) Exposed Structure Necrotic Amount: Large (67-100%) Fascia Exposed: No Fat Layer (Subcutaneous Tissue) Exposed: Yes Tendon Exposed: No Muscle Exposed: No Joint Exposed: No Bone Exposed: No Treatment Notes Wound #3 (Toe Third) Wound Laterality: Left Cleanser Soap and Water Discharge Instruction: Gently cleanse wound with antibacterial soap, rinse and pat dry prior to dressing wounds Peri-Wound Care Topical Primary Dressing Iodosorb 40 (g) Discharge Instruction: Apply IodoSorb to wound bed only as directed. Secondary Dressing Gauze Discharge Instruction: As directed: dry, moistened with saline or moistened with Dakins Solution Secured With [place] H Soft Cloth Surgical T ape ape, 2x2 (in/yd) Compression Wrap Compression  Stockings Add-Ons Electronic Signature(s) Signed: 02/10/2022 5:09:02 PM By: Rosalio Loud MSN RN CNS WTA Entered By: Rosalio Loud on 02/10/2022 09:48:04 -------------------------------------------------------------------------------- Vitals Details Patient Name: Date of Service: Raymond [name], Raymond Negus [name]. 02/10/2022 9:30 A M Medical Record Number: [id number] Patient Account Number: [id number] Date of Birth/Sex: Treating RN: 08-25-1965 (57 y.o. [name] Primary Care Reece Mcbroom: [name] Other Clinician: Referring Isabelly Kobler: Treating Sondra Blixt/Extender: Hazle Coca in Treatment: 5 Vital Signs Raymond [name], Raymond [name] ([id number]) [id number] Page 11 of 11 Time Taken: 09:38 Temperature (F): 97.9 Height (in): 74 Pulse (bpm): 66 Weight (lbs): 371 Respiratory Rate (breaths/min): 16 Body Mass Index (BMI): 47.6 Blood Pressure (mmHg): 171/80 Reference Range: 80 - 120 mg / dl Electronic Signature(s) Signed: 02/10/2022 5:09:02 PM By: Rosalio Loud MSN RN CNS WTA Entered By: Rosalio Loud on 02/10/2022 09:41:57

## 2022-02-18 ENCOUNTER — Ambulatory Visit
Admission: RE | Admit: 2022-02-18 | Discharge: 2022-02-18 | Disposition: A | Discharge status: Home / Self Care | Payer: Medicare HMO | Source: Ambulatory Visit | Attending: Physician Assistant | Admitting: Physician Assistant

## 2022-02-18 ENCOUNTER — Other Ambulatory Visit: Payer: Self-pay | Admitting: Physician Assistant

## 2022-02-18 ENCOUNTER — Encounter: Payer: Medicare HMO | Admitting: Physician Assistant

## 2022-02-18 DIAGNOSIS — Z01818 Encounter for other preprocedural examination: Secondary | ICD-10-CM

## 2022-02-18 DIAGNOSIS — R609 Edema, unspecified: Secondary | ICD-10-CM | POA: Insufficient documentation

## 2022-02-18 DIAGNOSIS — E11621 Type 2 diabetes mellitus with foot ulcer: Secondary | ICD-10-CM | POA: Diagnosis not present

## 2022-02-18 DIAGNOSIS — J811 Chronic pulmonary edema: Secondary | ICD-10-CM | POA: Diagnosis present

## 2022-02-18 NOTE — Progress Notes (Addendum)
JYAIRE, KOUDELKA ([id number]) [id number] Page 1 of 10 Visit Report for 02/18/2022 Arrival Information Details Patient Name: Date of Service: Raymond [name] 02/18/2022 8:30 A M Medical Record Number: [id number] Patient Account Number: [id number] Date of Birth/Sex: Treating RN: [birth year] (57 y.o. [name] Primary Care Morton Simson: [name] Other Clinician: Referring Bertil Brickey: Treating Florabelle Cardin/Extender: Hazle Coca in Treatment: 6 Visit Information History Since Last Visit Added or deleted any medications: No Patient Arrived: Ambulatory Has Dressing in Place as Prescribed: Yes Arrival Time: 08:39 Has Footwear/Offloading in Place as Yes Transfer Assistance: None Prescribed: Patient Identification Verified: Yes Left: Surgical Shoe with Pressure Relief Insole Secondary Verification Process Completed: Yes Pain Present Now: No Patient Requires Transmission-Based Precautions: No Patient Has Alerts: Yes Patient Alerts: Type II Diabetic Aspirin '81mg'$  ABI L [place]>220 Electronic Signature(s) Signed: 02/18/2022 1:44:04 PM By: Gretta Cool, BSN, RN, CWS, Kim RN, BSN Entered By: Gretta Cool, BSN, RN, CWS, Kim on 02/18/2022 08:40:35 -------------------------------------------------------------------------------- Encounter Discharge Information Details Patient Name: Date of Service: Raymond Eke D. 02/18/2022 8:30 A M Medical Record Number: [id number] Patient Account Number: [id number] Date of Birth/Sex: Treating RN: Jun 09, 1965 (57 y.o. [name] Primary Care Davonne Baby: [name] Other Clinician: Referring Adrion Menz: Treating Kinga Cassar/Extender: Hazle Coca in Treatment: 6 Encounter Discharge Information Items Post Procedure Vitals Discharge Condition: Stable Temperature (F): 98.14 Ambulatory Status: Ambulatory Pulse (bpm): 81 Discharge Destination: Home Respiratory Rate (breaths/min):  16 Transportation: Private Auto Blood Pressure (mmHg): 142/66 Schedule Follow-up Appointment: No Clinical Summary of Care: Electronic Signature(s) Signed: 02/18/2022 1:44:04 PM By: Gretta Cool, BSN, RN, CWS, Kim RN, BSN Wise, Gwyndolyn Saxon D ([id number]) [phone].pdf Page 2 of 10 Entered By: Gretta Cool, BSN, RN, CWS, Kim on 02/18/2022 09:18:37 -------------------------------------------------------------------------------- Lower Extremity Assessment Details Patient Name: Date of Service: Raymond [name] 02/18/2022 8:30 A M Medical Record Number: [id number] Patient Account Number: [id number] Date of Birth/Sex: Treating RN: 1965/07/04 (57 y.o. [name] Primary Care Krystalle Pilkington: [name] Other Clinician: Referring Lyvia Mondesir: Treating Serafina Topham/Extender: [name] Weeks in Treatment: 6 Edema Assessment Assessed: Shirlyn Goltz: Yes] Patrice Paradise: No] [Left: Edema] [Right: :] Vascular Assessment Pulses: Dorsalis Pedis Palpable: [Left:Yes] Electronic Signature(s) Signed: 02/18/2022 1:44:04 PM By: Gretta Cool, BSN, RN, CWS, Kim RN, BSN Entered By: Gretta Cool, BSN, RN, CWS, Kim on 02/18/2022 08:52:39 -------------------------------------------------------------------------------- Multi Wound Chart Details Patient Name: Date of Service: Raymond Eke D. 02/18/2022 8:30 A M Medical Record Number: [id number] Patient Account Number: [id number] Date of Birth/Sex: Treating RN: 12-05-1965 (57 y.o. [name] Primary Care Jermayne Sweeney: [name] Other Clinician: Referring Zariel Capano: Treating Haizel Gatchell/Extender: [name] Weeks in Treatment: 6 Vital Signs Height(in): 74 Pulse(bpm): 81 Weight(lbs): 998 Blood Pressure(mmHg): 142/66 Body Mass Index(BMI): 47.6 Temperature(F): 98.1 Respiratory Rate(breaths/min): 16 [2:Photos:] [N/A:N/A [id number] Page 3 of 10] Left, Medial Metatarsal head first Left T Third oe  N/A Wound Location: Gradually Appeared Footwear Injury N/A Wounding Event: Diabetic Wound/Ulcer of the Lower Diabetic Wound/Ulcer of the Lower N/A Primary Etiology: Extremity Extremity Congestive Heart Failure, Congestive Heart Failure, N/A Comorbid History: Hypertension, Type II Diabetes, Hypertension, Type II Diabetes, Neuropathy Neuropathy 12/13/2021 01/03/2022 N/A Date Acquired: 6 6 N/A Weeks of Treatment: Open Open N/A Wound Status: No No N/A Wound Recurrence: 0.7x0.4x0.6 0.2x0.2x0.2 N/A Measurements L x W x D (cm) 0.22 0.031 N/A A (cm) : rea 0.132 0.006 N/A Volume (cm) : 82.70% 95.10% N/A % Reduction in A rea: 65.40% 95.30% N/A % Reduction in Volume: Grade 3 Grade 3 N/A Classification:  MRI N/A N/A Earleen Newport Verification: Medium Medium N/A Exudate A mount: Serous Serous N/A Exudate Type: amber amber N/A Exudate Color: Flat and Intact Flat and Intact N/A Wound Margin: None Present (0%) None Present (0%) N/A Granulation A mount: Large (67-100%) Large (67-100%) N/A Necrotic A mount: Fat Layer (Subcutaneous Tissue): Yes Fat Layer (Subcutaneous Tissue): Yes N/A Exposed Structures: Fascia: No Fascia: No Tendon: No Tendon: No Muscle: No Muscle: No Joint: No Joint: No Bone: No Bone: No None None N/A Epithelialization: Debridement - Excisional Debridement - Excisional N/A Debridement: Pre-procedure Verification/Time Out 08:55 08:55 N/A Taken: Callus, Subcutaneous, Slough Callus, Subcutaneous, Slough N/A Tissue Debrided: Skin/Subcutaneous Tissue Skin/Subcutaneous Tissue N/A Level: 0.28 0.04 N/A Debridement A (sq cm): rea Curette Curette N/A Instrument: Moderate Moderate N/A Bleeding: Pressure Pressure N/A Hemostasis A chieved: Procedure was tolerated well Procedure was tolerated well N/A Debridement Treatment Response: 0.7x0.4x0.6 0.6x0.4x0.2 N/A Post Debridement Measurements L x W x D (cm) 0.132 0.038 N/A Post Debridement Volume:  (cm) Debridement Debridement N/A Procedures Performed: Treatment Notes Electronic Signature(s) Signed: 02/18/2022 1:44:04 PM By: Gretta Cool, BSN, RN, CWS, Kim RN, BSN Entered By: Gretta Cool, BSN, RN, CWS, Kim on 02/18/2022 09:11:28 -------------------------------------------------------------------------------- Multi-Disciplinary Care Plan Details Patient Name: Date of Service: Raymond [name], Raymond Negus D. 02/18/2022 8:30 A M Medical Record Number: [id number] Patient Account Number: [id number] Date of Birth/Sex: Treating RN: 07-28-1965 (57 y.o. [name] Primary Care Nelissa Bolduc: [name] Other Clinician: Referring Vinay Ertl: Treating Katessa Attridge/Extender: [name] [place] ([id number]) [phone].pdf Page 4 of 10 Weeks in Treatment: 6 Active Inactive Medication Nursing Diagnoses: Knowledge deficit related to medication safety: actual or potential Goals: Patient/caregiver will demonstrate understanding of all current medications Date Initiated: 01/03/2022 T arget Resolution Date: 02/18/2022 Goal Status: Active Interventions: Assess for medication contraindications each visit where new medications are prescribed Assess patient/caregiver ability to manage medication regimen upon admission and as needed Patient/Caregiver given reconciled medication list upon admission, changes in medications and discharge from the [place] Provide education on medication safety Notes: Necrotic Tissue Nursing Diagnoses: Impaired tissue integrity related to necrotic/devitalized tissue Knowledge deficit related to management of necrotic/devitalized tissue Goals: Necrotic/devitalized tissue will be minimized in the wound bed Date Initiated: 01/03/2022 Target Resolution Date: 02/18/2022 Goal Status: Active Patient/caregiver will verbalize understanding of reason and process for debridement of necrotic tissue Date Initiated: 01/03/2022 Target Resolution  Date: 02/18/2022 Goal Status: Active Interventions: Assess patient pain level pre-, during and post procedure and prior to discharge Provide education on necrotic tissue and debridement process Treatment Activities: Excisional debridement : 01/03/2022 Notes: Pressure Nursing Diagnoses: Knowledge deficit related to causes and risk factors for pressure ulcer development Knowledge deficit related to management of pressures ulcers Potential for impaired tissue integrity related to pressure, friction, moisture, and shear Goals: Patient will remain free from development of additional pressure ulcers Date Initiated: 01/03/2022 Target Resolution Date: 02/18/2022 Goal Status: Active Patient/caregiver will verbalize risk factors for pressure ulcer development Date Initiated: 01/03/2022 Target Resolution Date: 02/18/2022 Goal Status: Active Patient/caregiver will verbalize understanding of pressure ulcer management Date Initiated: 01/03/2022 Target Resolution Date: 02/18/2022 Goal Status: Active Interventions: Assess potential for pressure ulcer upon admission and as needed Notes: Soft Tissue Infection Nursing Diagnoses: Impaired tissue integrity Knowledge deficit related to disease process and management Knowledge deficit related to home infection control: handwashing, handling of soiled dressings, supply storage Potential for infection: soft tissue OZ, GAMMEL ([id number]) [id number] Page 5 of 10 Goals: Patient will remain free of wound infection Date Initiated: 01/03/2022 Target Resolution  Date: 02/18/2022 Goal Status: Active Patient/caregiver will verbalize understanding of or measures to prevent infection and contamination in the home setting Date Initiated: 01/03/2022 Target Resolution Date: 02/18/2022 Goal Status: Active Patient's soft tissue infection will resolve Date Initiated: 01/03/2022 Target Resolution Date: 02/18/2022 Goal Status:  Active Signs and symptoms of infection will be recognized early to allow for prompt treatment Date Initiated: 01/03/2022 Target Resolution Date: 02/18/2022 Goal Status: Active Interventions: Assess signs and symptoms of infection every visit Treatment Activities: Culture and sensitivity : 01/03/2022 Notes: Wound/Skin Impairment Nursing Diagnoses: Impaired tissue integrity Knowledge deficit related to ulceration/compromised skin integrity Goals: Ulcer/skin breakdown will have a volume reduction of 30% by week 4 Date Initiated: 02/18/2022 Target Resolution Date: 02/18/2022 Goal Status: Active Ulcer/skin breakdown will have a volume reduction of 50% by week 8 Date Initiated: 02/18/2022 Target Resolution Date: 03/18/2022 Goal Status: Active Interventions: Assess patient/caregiver ability to obtain necessary supplies Assess ulceration(s) every visit Treatment Activities: Consult for HBO : 02/18/2022 Referred to DME Raeshawn Vo for dressing supplies : 02/18/2022 Skin care regimen initiated : 02/18/2022 Notes: Electronic Signature(s) Signed: 02/18/2022 1:44:04 PM By: Gretta Cool, BSN, RN, CWS, Kim RN, BSN Entered By: Gretta Cool, BSN, RN, CWS, Kim on 02/18/2022 09:17:29 -------------------------------------------------------------------------------- Pain Assessment Details Patient Name: Date of Service: Raymond Eke D. 02/18/2022 8:30 A M Medical Record Number: [id number] Patient Account Number: [id number] Date of Birth/Sex: Treating RN: 1965/12/05 (57 y.o. [name] Primary Care Kaulin Chaves: [name] Other Clinician: Referring Meredyth Hornung: Treating Elmer Boutelle/Extender: Hazle Coca in Treatment: [address] KAHMARI, KOLLER ([id number]) [id number] Page 6 of 10 Location of Pain Severity and Description of Pain Patient Has Paino No Site Locations Pain Management and Medication Current Pain Management: Notes Patient denies pain  at this time. Electronic Signature(s) Signed: 02/18/2022 1:44:04 PM By: Gretta Cool, BSN, RN, CWS, Kim RN, BSN Entered By: Gretta Cool, BSN, RN, CWS, Kim on 02/18/2022 08:42:41 -------------------------------------------------------------------------------- Patient/Caregiver Education Details Patient Name: Date of Service: Raymond [name] 1/19/2024andnbsp8:30 A M Medical Record Number: [id number] Patient Account Number: [id number] Date of Birth/Gender: Treating RN: 10-20-1965 (57 y.o. [name] Primary Care Physician: [name] Other Clinician: Referring Physician: Treating Physician/Extender: Hazle Coca in Treatment: 6 Education Assessment Education Provided To: Patient Education Topics Provided Hyperbaric Oxygenation: Handouts: Hyperbaric Oxygen Methods: Demonstration, Explain/Verbal Responses: State content correctly Wound/Skin Impairment: Handouts: Caring for Your Ulcer, Other: continue wound care as prescribed Methods: Demonstration, Explain/Verbal Responses: State content correctly Electronic Signature(s) DMITRY, MACOMBER ([id number]) [id number] Page 7 of 10 Signed: 02/18/2022 1:44:04 PM By: Gretta Cool, BSN, RN, CWS, Kim RN, BSN Entered By: Gretta Cool, BSN, RN, CWS, Kim on 02/18/2022 09:15:12 -------------------------------------------------------------------------------- Wound Assessment Details Patient Name: Date of Service: Raymond [name], Raymond Negus D. 02/18/2022 8:30 A M Medical Record Number: [id number] Patient Account Number: [id number] Date of Birth/Sex: Treating RN: 1965/09/09 (57 y.o. [name], [name] Primary Care Elazar Argabright: [name] Other Clinician: Referring Eldon Zietlow: Treating Trishelle Devora/Extender: [name] Weeks in Treatment: 6 Wound Status Wound Number: 2 Primary Diabetic Wound/Ulcer of the Lower Extremity Etiology: Wound Location: Left, Medial Metatarsal head first Wound Status:  Open Wounding Event: Gradually Appeared Comorbid Congestive Heart Failure, Hypertension, Type II Diabetes, Date Acquired: 12/13/2021 History: Neuropathy Weeks Of Treatment: 6 Clustered Wound: No Photos Wound Measurements Length: (cm) 0.7 Width: (cm) 0.4 Depth: (cm) 0.6 Area: (cm) 0.22 Volume: (cm) 0.132 % Reduction in Area: 82.7% % Reduction in Volume: 65.4% Epithelialization: None Wound Description Classification: Grade 3 Wagner Verification: MRI Wound Margin:  Flat and Intact Exudate Amount: Medium Exudate Type: Serous Exudate Color: amber Foul Odor After Cleansing: No Slough/Fibrino Yes Wound Bed Granulation Amount: None Present (0%) Exposed Structure Necrotic Amount: Large (67-100%) Fascia Exposed: No Necrotic Quality: Adherent Slough Fat Layer (Subcutaneous Tissue) Exposed: Yes Tendon Exposed: No Muscle Exposed: No Joint Exposed: No Bone Exposed: No Treatment Notes SEVILLE, DOWNS ([id number]) [id number] Page 8 of 10 Wound #2 (Metatarsal head first) Wound Laterality: Left, Medial Cleanser Byram Ancillary Kit - 15 Day Supply Discharge Instruction: Use supplies as instructed; Kit contains: (15) Saline Bullets; (15) 3x3 Gauze; 15 pr Gloves Soap and Water Discharge Instruction: Gently cleanse wound with antibacterial soap, rinse and pat dry prior to dressing wounds Peri-Wound Care Topical Primary Dressing AquacelAg Advantage Dressing, 2X2 (in/in) Discharge Instruction: Apply to wound as directed Secondary Dressing Kerlix 4.5 x 4.1 (in/yd) Discharge Instruction: Apply Kerlix 4.5 x 4.1 (in/yd) as instructed Secured With Medipore T - 41M Medipore H Soft Cloth Surgical T ape ape, 2x2 (in/yd) Compression Wrap Compression Stockings Environmental education officer) Signed: 02/18/2022 1:44:04 PM By: Gretta Cool, BSN, RN, CWS, Kim RN, BSN Entered By: Gretta Cool, BSN, RN, CWS, Kim on 02/18/2022  08:59:23 -------------------------------------------------------------------------------- Wound Assessment Details Patient Name: Date of Service: Raymond Eke D. 02/18/2022 8:30 A M Medical Record Number: [id number] Patient Account Number: [id number] Date of Birth/Sex: Treating RN: May 27, 1965 (57 y.o. [name] Primary Care Sim Choquette: [name] Other Clinician: Referring Shakinah Navis: Treating Nejla Reasor/Extender: [name] Weeks in Treatment: 6 Wound Status Wound Number: 3 Primary Diabetic Wound/Ulcer of the Lower Extremity Etiology: Wound Location: Left T Third oe Wound Status: Open Wounding Event: Footwear Injury Comorbid Congestive Heart Failure, Hypertension, Type II Diabetes, Date Acquired: 01/03/2022 History: Neuropathy Weeks Of Treatment: 6 Clustered Wound: No Photos BRADY, SCHILLER ([id number]) [id number] Page 9 of 10 Wound Measurements Length: (cm) 0.2 Width: (cm) 0.2 Depth: (cm) 0.2 Area: (cm) 0.031 Volume: (cm) 0.006 % Reduction in Area: 95.1% % Reduction in Volume: 95.3% Epithelialization: None Wound Description Classification: Grade 3 Wound Margin: Flat and Intact Exudate Amount: Medium Exudate Type: Serous Exudate Color: amber Foul Odor After Cleansing: No Slough/Fibrino Yes Wound Bed Granulation Amount: None Present (0%) Exposed Structure Necrotic Amount: Large (67-100%) Fascia Exposed: No Fat Layer (Subcutaneous Tissue) Exposed: Yes Tendon Exposed: No Muscle Exposed: No Joint Exposed: No Bone Exposed: No Treatment Notes Wound #3 (Toe Third) Wound Laterality: Left Cleanser Byram Ancillary Kit - 15 Day Supply Discharge Instruction: Use supplies as instructed; Kit contains: (15) Saline Bullets; (15) 3x3 Gauze; 15 pr Gloves Soap and Water Discharge Instruction: Gently cleanse wound with antibacterial soap, rinse and pat dry prior to dressing wounds Peri-Wound Care Topical Primary  Dressing AquacelAg Advantage Dressing, 2X2 (in/in) Discharge Instruction: Apply to wound as directed Secondary Dressing Kerlix 4.5 x 4.1 (in/yd) Discharge Instruction: Apply Kerlix 4.5 x 4.1 (in/yd) as instructed Secured With Medipore T - 41M Medipore H Soft Cloth Surgical T ape ape, 2x2 (in/yd) Compression Wrap Compression Stockings Environmental education officer) Signed: 02/18/2022 1:44:04 PM By: Gretta Cool, BSN, RN, CWS, Kim RN, BSN Entered By: Gretta Cool, BSN, RN, CWS, Kim on 02/18/2022 09:00:15 [name] ([id number]) [id number] Page 10 of 10 -------------------------------------------------------------------------------- Vitals Details Patient Name: Date of Service: Raymond [name]. 02/18/2022 8:30 A M Medical Record Number: [id number] Patient Account Number: [id number] Date of Birth/Sex: Treating RN: September 25, 1965 (57 y.o. [name] Primary Care Ileanna Gemmill: [name] Other Clinician: Referring Aeon Kessner: Treating Donovan Persley/Extender: [name] Weeks in Treatment: 6 Vital  Signs Time Taken: 08:40 Temperature (F): 98.1 Height (in): 74 Pulse (bpm): 81 Weight (lbs): 371 Respiratory Rate (breaths/min): 16 Body Mass Index (BMI): 47.6 Blood Pressure (mmHg): 142/66 Reference Range: 80 - 120 mg / dl Electronic Signature(s) Signed: 02/18/2022 1:44:04 PM By: Gretta Cool, BSN, RN, CWS, Kim RN, BSN Entered By: Gretta Cool, BSN, RN, CWS, Kim on 02/18/2022 85:90:93

## 2022-02-18 NOTE — Progress Notes (Addendum)
VINH, [facility] ([id number]) [id number] Page 1 of 11 Visit Report for 02/18/2022 Chief Complaint Document Details Patient Name: Date of Service: Raymond [name]. 02/18/2022 8:30 A M Medical Record Number: [id number] Patient Account Number: [id number] Date of Birth/Sex: Treating RN: [birth year] (57 y.o. [name] Primary Care Provider: [name] Other Clinician: Referring Provider: Treating Provider/Extender: Hazle Coca in Treatment: 6 Information Obtained from: Patient Chief Complaint Left foot ulcer with chronic osteomyelitis Electronic Signature(s) Signed: 02/18/2022 8:31:49 AM By: [name] PA-C Entered By: [name] on 02/18/2022 08:31:49 -------------------------------------------------------------------------------- Debridement Details Patient Name: Date of Service: Raymond Eke D. 02/18/2022 8:30 A M Medical Record Number: [id number] Patient Account Number: [id number] Date of Birth/Sex: Treating RN: 04-14-1965 (57 y.o. [name] Primary Care Provider: [name] Other Clinician: Referring Provider: Treating Provider/Extender: [name] Weeks in Treatment: 6 Debridement Performed for Assessment: Wound #2 Left,Medial Metatarsal head first Performed By: Physician Tommie Sams., PA-C Debridement Type: Debridement Severity of Tissue Pre Debridement: Fat layer exposed Level of Consciousness (Pre-procedure): Awake and Alert Pre-procedure Verification/Time Out Yes - 08:55 Taken: T Area Debrided (L x W): otal 0.7 (cm) x 0.4 (cm) = 0.28 (cm) Tissue and other material debrided: Viable, Non-Viable, Callus, Slough, Subcutaneous, Slough Level: Skin/Subcutaneous Tissue Debridement Description: Excisional Instrument: Curette Bleeding: Moderate Hemostasis Achieved: Pressure Response to Treatment: Procedure was tolerated well Level of Consciousness (Post- Awake  and Alert procedure): Post Debridement Measurements of Total Wound DONTRAY, [place] ([id number]) [id number] Page 2 of 11 Length: (cm) 0.7 Width: (cm) 0.4 Depth: (cm) 0.6 Volume: (cm) 0.132 Character of Wound/Ulcer Post Debridement: Stable Severity of Tissue Post Debridement: Fat layer exposed Post Procedure Diagnosis Same as Pre-procedure Electronic Signature(s) Signed: 02/18/2022 1:44:04 PM By: Gretta Cool, BSN, RN, CWS, Kim RN, BSN Signed: 02/18/2022 2:37:27 PM By: [name] PA-C Entered By: Gretta Cool, BSN, RN, CWS, Kim on 02/18/2022 08:56:33 -------------------------------------------------------------------------------- Debridement Details Patient Name: Date of Service: Raymond Eke D. 02/18/2022 8:30 A M Medical Record Number: [id number] Patient Account Number: [id number] Date of Birth/Sex: Treating RN: Sep 19, 1965 (57 y.o. [name] Primary Care Provider: [name] Other Clinician: Referring Provider: Treating Provider/Extender: [name] Weeks in Treatment: 6 Debridement Performed for Assessment: Wound #3 Left T Third oe Performed By: Physician Tommie Sams., PA-C Debridement Type: Debridement Severity of Tissue Pre Debridement: Fat layer exposed Level of Consciousness (Pre-procedure): Awake and Alert Pre-procedure Verification/Time Out Yes - 08:55 Taken: T Area Debrided (L x W): otal 0.2 (cm) x 0.2 (cm) = 0.04 (cm) Tissue and other material debrided: Viable, Non-Viable, Callus, Slough, Subcutaneous, Slough Level: Skin/Subcutaneous Tissue Debridement Description: Excisional Instrument: Curette Bleeding: Moderate Hemostasis Achieved: Pressure Response to Treatment: Procedure was tolerated well Level of Consciousness (Post- Awake and Alert procedure): Post Debridement Measurements of Total Wound Length: (cm) 0.6 Width: (cm) 0.4 Depth: (cm) 0.2 Volume: (cm) 0.038 Character of Wound/Ulcer Post  Debridement: Stable Severity of Tissue Post Debridement: Fat layer exposed Post Procedure Diagnosis Same as Pre-procedure Electronic Signature(s) Signed: 02/18/2022 1:44:04 PM By: Gretta Cool, BSN, RN, CWS, Kim RN, BSN Signed: 02/18/2022 2:37:27 PM By: [name] PA-C Entered By: Gretta Cool, BSN, RN, CWS, Kim on 02/18/2022 09:01:33 [name] ([id number]) [id number] Page 3 of 11 -------------------------------------------------------------------------------- HPI Details Patient Name: Date of Service: Raymond [name]. 02/18/2022 8:30 A M Medical Record Number: [id number] Patient Account Number: [id number] Date of Birth/Sex: Treating RN: November 18, 1965 (57 y.o. [name], [name]  Primary Care Provider: [name] Other Clinician: Referring Provider: Treating Provider/Extender: Hazle Coca in Treatment: 6 History of Present Illness HPI Description: 09/05/17-He is seeing an initial evaluation for a left plantar foot ulcer. He has a remote history of left great toe amputation. He states that 4-6 weeks ago he noted callus formation and ulceration. He has not seen primary care regarding this. He is not currently on antibiotic therapy. He does not routinely follow with podiatry. He states diabetic foot wear will arrive early next week. The EHR shows an A1c of 9% approximate 4 months ago but he states he had one and primary care a few weeks ago but does not know the results. He is neuropathic and does not complain of any pain, he is currently wearing crocs. 09/12/17-he is here in follow up evaluation for left plantar foot ulcer. There is improvement in both appearance and measurement. We will continue with same treatment plan and he will follow up next week. 09/19/17 on evaluation today patient actually appears to be doing excellent in regard to his ulcer on the invitation site of his left great foot plantar aspect. He's been tolerating the  dressing changes without complication. In fact with the Prisma and the current measures he has been shown signs of excellent improvement week by week up to this point. We have been to breeding the wound in this seems to have been of great benefit for him. Fortunately there is no evidence of infection. 09/26/17 on evaluation today patient appears to be doing rather well in regard to his wound. He did not know quite as much improvement this week as compared to last week. Nonetheless he still continues to show signs of improving to some degree. I do believe he may benefit from an offloading shoe. No fevers, chills, nausea, or vomiting noted at this time. 10/03/17 on evaluation today patient actually appears to be doing much better in regard to the amputation site plantar foot ulcer. Overall this appears significantly smaller even compared to previous. He's been tolerating the dressing changes without complication. He did get his diabetic shoes and I did have a look at them they appear to be fairly good. Nonetheless I do think that for the time being I would probably recommend he continue with the offloading shoe that we have been utilizing just due to the fact that with the dressing I don't know that his diabetic shoes are going to work as appropriately as far as not called an additional pressure and irritation to the area in question. He understands. 10/10/17 on evaluation today patient actually appears to be doing very well in regard to his plantar foot ulcer. He has been tolerating the dressing changes without complication. I do feel like he's making signs of good improvement and in fact of the wound bed appears to be better although it may not be significantly changed in size it appears healthier and I do believe is showing signs of improving. Nonetheless we gonna keep working towards healing as far as that is concerned. There is no evidence of infection. 10/17/17 on evaluation today patient actually  appears to be doing poorly in regard to his plantar foot ulcer. Unfortunately other than just the area where the wound is itself there appears to be a blister that communicates with the wound unfortunately. This is more lateral to the wound itself and also to the distal point of the amputation site. There does not appear to be any evidence of cellulitis spreading at  the foot but I do believe some of the drainage from the site itself is. When in nature. Nonetheless this blister area I think needs to be removed in order to allow for appropriate healing of the ulcer itself I think that is gonna be difficult for it to heal otherwise. This was discussed with the patient today. 10/24/17 on evaluation today patient actually appears to be doing better compared to last week even post debridement. He has shown signs of improvement he did test positive for Staphylococcus aureus. With that being said he notes that he's not have any discomfort and in general he does feel like things seem to been doing better. Fortunately there's no additional blistering and no evidence of remaining infection at this time. No fevers, chills, nausea, or vomiting noted at this time. He has three days of antibiotic left. 10/31/17 on evaluation today patient actually appears to be doing very well in regard to his ulcer on the left foot. He has been tolerating the dressing changes without complication. With that being said fortunately there appears to be no evidence of infection I do think he's made progress compared to previous. No fevers, chills, nausea, or vomiting noted at this time. 11/14/17 on evaluation today patient actually appears to be doing excellent in regard to his amputation site ulcer on his foot. In fact this appears to be completely healed at this point. There is no evidence of infection nor underline abscess and I did thoroughly evaluate the periwound location. Overall I'm very pleased with how things  appear. Readmission: 01-03-2022 upon evaluation today patient appears for reevaluation here in the clinic although it has been since 2019 that I last saw him. This again has been over 4 years. With that being said he is having an issue with a wound on his foot unfortunately. He has not had any recent x-rays he tells me at this point which is unfortunate as avoid definitely can need to get something started that can be one of the primary items to get moving forward with. With that being said I also think were probably can obtain a wound culture he is probably going to require some antibiotics at some point. I just want to make sure that we have him on the right thing when we do this. Currently the patient tells me that his wounds have been present in regard to the met head for about a month at this point. With that being said he also has a wound on the third toe which is the next toe this actually still present. This unfortunately has a lot of callus buildup around it but I think it is larger underneath than what it would appear on initial inspection. Patient does have a history of diabetes mellitus type 2, congestive heart failure, and COPD. 01-11-2022 upon evaluation patient's wounds actually are showing signs of doing about the same may be just slightly cleaner but definitely not where we want things to be as of yet. Fortunately there does not appear to be any signs of active infection locally nor systemically at this point which is great news. No fevers, chills, nausea, vomiting, or diarrhea. FILMORE, MOLYNEUX ([id number]) [id number] Page 4 of 11 Of note patient has had his MRI but we do not have the results of that as of yet we are still waiting for the reading on this at this point. 01-18-2022 upon evaluation today patient presents after having had his MRI finally complete. It did show that he has evidence of  osteomyelitis of the first metatarsal head, third  digit, and fourth digit. The third and fourth digits seem to be early which is good news. Nonetheless we are still going to need to try to see about getting the infection under control he is already been on the antibiotic therapy which I think has done quite well. In fact I feel like he is showing signs of improvement already. 12/28; patient with a wound on the medial aspect of the left first metatarsal head and an area on the left third toe with slight hammer deformity. He has had a previous left first toe amputation. We are using Iodoflex on the wound. He is a diabetic a recent MRI showed osteomyelitis we have been giving him Cipro and Doxy which he appears to be tolerating well. 02-10-2022 upon evaluation today patient unfortunately has issues still with open wounds of his foot on the left. He does have known osteomyelitis at this point and that is something that we have been treating with oral antibiotics. I actually have not seen him personally since 19 December. In that time he has not had any debridement from that point until today based on what I can see as best I can tell anyway. Has been on Cipro as well as doxycycline. Nonetheless he does still seem to have open wounds today and I think that he would benefit from hyperbaric oxygen therapy. I discussed that with him today as well. 02-18-2022 upon evaluation today patient appears to be doing about the same in regard to his wounds. Fortunately I do not see any signs of infection although he still has areas that seem to initially close down but that he has a lot of callus that still open at both locations most on the metatarsal region as well as the toe. I think that he is appropriate for proceeding with the hyperbarics and I think the sooner we get this done to try to get these wounds healed the better off he will be. Electronic Signature(s) Signed: 02/18/2022 10:46:19 AM By: [name] PA-C Entered By: [name] on 02/18/2022  10:46:19 -------------------------------------------------------------------------------- Physical Exam Details Patient Name: Date of Service: Raymond Eke D. 02/18/2022 8:30 A M Medical Record Number: [id number] Patient Account Number: [id number] Date of Birth/Sex: Treating RN: 1965/09/30 (57 y.o. [name] Primary Care Provider: [name] Other Clinician: Referring Provider: Treating Provider/Extender: [name] Weeks in Treatment: 6 Constitutional Well-nourished and well-hydrated in no acute distress. Respiratory normal breathing without difficulty. Psychiatric this patient is able to make decisions and demonstrates good insight into disease process. Alert and Oriented x 3. pleasant and cooperative. Notes Upon inspection patient's wound bed actually showed signs of good granulation and some areas once I remove some of the callus and necrotic tissue covering. However there is still some deep wounds both on the toe as well as a metatarsal location which I think would benefit from hyperbaric oxygen therapy. I discussed this with the patient as well and he does want to proceed as such. I am to get things set up he is going for his x-ray as well as his EKG later this morning and then hopefully will get everything back so that we can clear him for starting hyperbarics hopefully come Tuesday. Electronic Signature(s) Signed: 02/18/2022 10:46:54 AM By: [name] PA-C Entered By: [name] on 02/18/2022 10:46:54 [name] ([id number]) [id number] Page 5 of 11 -------------------------------------------------------------------------------- Physician Orders Details Patient Name: Date of Service: CLINKSCA LES,  Lyman Bishop M D. 02/18/2022 8:30 A M Medical Record Number: [id number] Patient Account Number: [id number] Date of Birth/Sex: Treating RN: [birth year] (57 y.o. [name], [name] Primary Care Provider: [name] Other Clinician: Referring Provider: Treating Provider/Extender: Hazle Coca in Treatment: 6 Verbal / Phone Orders: No Diagnosis Coding ICD-10 Coding Code Description [phone] Chronic multifocal osteomyelitis, left ankle and foot E11.621 Type 2 diabetes mellitus with foot ulcer L97.522 Non-pressure chronic ulcer of other part of left foot with fat layer exposed I50.42 Chronic combined systolic (congestive) and diastolic (congestive) heart failure J44.9 Chronic obstructive pulmonary disease, unspecified Follow-up Appointments Wound #2 Left,Medial Metatarsal head first Return Appointment in 1 week. Wound #3 Left T Third oe Return Appointment in 1 week. Bathing/ [employer] wounds with antibacterial soap and water. May shower; gently cleanse wound with antibacterial soap, rinse and pat dry prior to dressing wounds Off-Loading Open toe surgical shoe Hyperbaric Oxygen Therapy Wound #2 Left,Medial Metatarsal head first Evaluate for HBO Therapy Indication and location: - [place] Grade 3 ulcers of the left foot and 3rd toe If appropriate for treatment, begin HBOT per protocol: 2.0 ATA for 90 Minutes without A Breaks ir One treatment per day (delivered Monday through Friday unless otherwise specified in Special Instructions below): Total # of Treatments: - 40 A ntihistamine 30 minutes prior to HBO Treatment, difficulty clearing ears. Finger stick Blood Glucose Pre- and Post- HBOT Treatment. Follow Hyperbaric Oxygen Glycemia Protocol Wound #3 Left T Third oe Evaluate for HBO Therapy Indication and location: - [place] Grade 3 ulcers of the left foot and 3rd toe If appropriate for treatment, begin HBOT per protocol: 2.0 ATA for 90 Minutes without A Breaks ir One treatment per day (delivered Monday through Friday unless otherwise specified in Special Instructions below): Total # of Treatments: - 40 A ntihistamine 30 minutes prior to HBO Treatment,  difficulty clearing ears. Finger stick Blood Glucose Pre- and Post- HBOT Treatment. Follow Hyperbaric Oxygen Glycemia Protocol Wound Treatment Wound #2 - Metatarsal head first Wound Laterality: Left, Medial Cleanser: Byram Ancillary Kit - 15 Day Supply (Generic) 3 x Per Week/30 Days Discharge Instructions: Use supplies as instructed; Kit contains: (15) Saline Bullets; (15) 3x3 Gauze; 15 pr Gloves Cleanser: Soap and Water 3 x Per Week/30 Days Discharge Instructions: Gently cleanse wound with antibacterial soap, rinse and pat dry prior to dressing wounds DUVAN, MOUSEL ([id number]) [id number] Page 6 of 11 Prim Dressing: AquacelAg Advantage Dressing, 2X2 (in/in) 3 x Per Week/30 Days ary Discharge Instructions: Apply to wound as directed Secondary Dressing: Kerlix 4.5 x 4.1 (in/yd) 3 x Per Week/30 Days Discharge Instructions: Apply Kerlix 4.5 x 4.1 (in/yd) as instructed Secured With: Medipore T - 45M Medipore H Soft Cloth Surgical T ape ape, 2x2 (in/yd) (Generic) 3 x Per Week/30 Days Wound #3 - T Third oe Wound Laterality: Left Cleanser: Byram Ancillary Kit - 15 Day Supply (Generic) 3 x Per Week/30 Days Discharge Instructions: Use supplies as instructed; Kit contains: (15) Saline Bullets; (15) 3x3 Gauze; 15 pr Gloves Cleanser: Soap and Water 3 x Per Week/30 Days Discharge Instructions: Gently cleanse wound with antibacterial soap, rinse and pat dry prior to dressing wounds Prim Dressing: AquacelAg Advantage Dressing, 2X2 (in/in) 3 x Per Week/30 Days ary Discharge Instructions: Apply to wound as directed Secondary Dressing: Kerlix 4.5 x 4.1 (in/yd) 3 x Per Week/30 Days Discharge Instructions: Apply Kerlix 4.5 x 4.1 (in/yd) as instructed Secured With: Medipore T - 45M Medipore H Soft Cloth Surgical T ape ape,  2x2 (in/yd) (Generic) 3 x Per Week/30 Days GLYCEMIA INTERVENTIONS PROTOCOL PRE-HBO GLYCEMIA INTERVENTIONS ACTION INTERVENTION Obtain pre-HBO  capillary blood glucose (ensure 1 physician order is in chart). A. Notify HBO physician and await physician orders. 2 If result is 70 mg/dl or below: B. If the result meets the hospital definition of a critical result, follow hospital policy. A. Give patient an 8 ounce Glucerna Shake, an 8 ounce Ensure, or 8 ounces of a Glucerna/Ensure equivalent dietary supplement*. B. Wait 30 minutes. If result is 71 mg/dl to 130 mg/dl: C. Retest patients capillary blood glucose (CBG). D. If result greater than or equal to 110 mg/dl, proceed with HBO. If result less than 110 mg/dl, notify HBO physician and consider holding HBO. If result is 131 mg/dl to 249 mg/dl: A. Proceed with HBO. A. Notify HBO physician and await physician orders. B. It is recommended to hold HBO and do If result is 250 mg/dl or greater: blood/urine ketone testing. C. If the result meets the hospital definition of a critical result, follow hospital policy. POST-HBO GLYCEMIA INTERVENTIONS ACTION INTERVENTION Obtain post HBO capillary blood glucose (ensure 1 physician order is in chart). A. Notify HBO physician and await physician orders. 2 If result is 70 mg/dl or below: B. If the result meets the hospital definition of a critical result, follow hospital policy. A. Give patient an 8 ounce Glucerna Shake, an 8 ounce Ensure, or 8 ounces of a Glucerna/Ensure equivalent dietary supplement*. B. Wait 15 minutes for symptoms of If result is 71 mg/dl to 100 mg/dl: hypoglycemia (i.e. nervousness, anxiety, sweating, chills, clamminess, irritability, confusion, tachycardia or dizziness). C. If patient asymptomatic, discharge patient. If patient symptomatic, repeat capillary blood glucose (CBG) and notify HBO physician. If result is 101 mg/dl to 249 mg/dl: A. Discharge patient. A. Notify HBO physician and await physician orders. B. It is recommended to do blood/urine ketone If result is 250 mg/dl or  greater: testing. C. If the result meets the hospital definition of a critical result, follow hospital policy. *Juice or candies are NOT equivalent products. If patient refuses the Glucerna or Ensure, please consult the hospital dietitian for an appropriate substitute. Electronic Signature(s) Signed: 02/18/2022 1:44:04 PM By: Gretta Cool, BSN, RN, CWS, Kim RN, BSN Rodney, [place] D ([id number]) [phone].pdf Page 7 of 11 Signed: 02/18/2022 2:37:27 PM By: [name] PA-C Entered By: Gretta Cool BSN, RN, CWS, Kim on 02/18/2022 09:14:35 -------------------------------------------------------------------------------- Problem List Details Patient Name: Date of Service: [name], [name] 02/18/2022 8:30 A M Medical Record Number: [id number] Patient Account Number: [id number] Date of Birth/Sex: Treating RN: 1965/04/04 (57 y.o. [name], [name] Primary Care Provider: [name] Other Clinician: Referring Provider: Treating Provider/Extender: [name] Weeks in Treatment: 6 Active Problems ICD-10 Encounter Code Description Active Date MDM Diagnosis [phone] Chronic multifocal osteomyelitis, left ankle and foot 02/10/2022 No Yes E11.621 Type 2 diabetes mellitus with foot ulcer 01/03/2022 No Yes L97.522 Non-pressure chronic ulcer of other part of left foot with fat layer exposed 01/03/2022 No Yes I50.42 Chronic combined systolic (congestive) and diastolic (congestive) heart failure 01/03/2022 No Yes J44.9 Chronic obstructive pulmonary disease, unspecified 01/03/2022 No Yes Inactive Problems Resolved Problems Electronic Signature(s) Signed: 02/18/2022 8:31:46 AM By: [name] PA-C Entered By: [name] on 02/18/2022 08:31:45 [name] ([id number]) [id number] Page 8 of 11 -------------------------------------------------------------------------------- Progress Note Details Patient Name: Date of  Service: Raymond [name]. 02/18/2022 8:30 A M Medical Record Number: [id number] Patient Account Number: [id number] Date of Birth/Sex:  Treating RN: July 19, 1965 (57 y.o. [name] Primary Care Provider: [name] Other Clinician: Referring Provider: Treating Provider/Extender: Hazle Coca in Treatment: 6 Subjective Chief Complaint Information obtained from Patient Left foot ulcer with chronic osteomyelitis History of Present Illness (HPI) 09/05/17-He is seeing an initial evaluation for a left plantar foot ulcer. He has a remote history of left great toe amputation. He states that 4-6 weeks ago he noted callus formation and ulceration. He has not seen primary care regarding this. He is not currently on antibiotic therapy. He does not routinely follow with podiatry. He states diabetic foot wear will arrive early next week. The EHR shows an A1c of 9% approximate 4 months ago but he states he had one and primary care a few weeks ago but does not know the results. He is neuropathic and does not complain of any pain, he is currently wearing crocs. 09/12/17-he is here in follow up evaluation for left plantar foot ulcer. There is improvement in both appearance and measurement. We will continue with same treatment plan and he will follow up next week. 09/19/17 on evaluation today patient actually appears to be doing excellent in regard to his ulcer on the invitation site of his left great foot plantar aspect. He's been tolerating the dressing changes without complication. In fact with the Prisma and the current measures he has been shown signs of excellent improvement week by week up to this point. We have been to breeding the wound in this seems to have been of great benefit for him. Fortunately there is no evidence of infection. 09/26/17 on evaluation today patient appears to be doing rather well in regard to his wound. He did not know quite as much improvement this  week as compared to last week. Nonetheless he still continues to show signs of improving to some degree. I do believe he may benefit from an offloading shoe. No fevers, chills, nausea, or vomiting noted at this time. 10/03/17 on evaluation today patient actually appears to be doing much better in regard to the amputation site plantar foot ulcer. Overall this appears significantly smaller even compared to previous. He's been tolerating the dressing changes without complication. He did get his diabetic shoes and I did have a look at them they appear to be fairly good. Nonetheless I do think that for the time being I would probably recommend he continue with the offloading shoe that we have been utilizing just due to the fact that with the dressing I don't know that his diabetic shoes are going to work as appropriately as far as not called an additional pressure and irritation to the area in question. He understands. 10/10/17 on evaluation today patient actually appears to be doing very well in regard to his plantar foot ulcer. He has been tolerating the dressing changes without complication. I do feel like he's making signs of good improvement and in fact of the wound bed appears to be better although it may not be significantly changed in size it appears healthier and I do believe is showing signs of improving. Nonetheless we gonna keep working towards healing as far as that is concerned. There is no evidence of infection. 10/17/17 on evaluation today patient actually appears to be doing poorly in regard to his plantar foot ulcer. Unfortunately other than just the area where the wound is itself there appears to be a blister that communicates with the wound unfortunately. This is more lateral to the wound itself and also to  the distal point of the amputation site. There does not appear to be any evidence of cellulitis spreading at the foot but I do believe some of the drainage from the site itself is. When  in nature. Nonetheless this blister area I think needs to be removed in order to allow for appropriate healing of the ulcer itself I think that is gonna be difficult for it to heal otherwise. This was discussed with the patient today. 10/24/17 on evaluation today patient actually appears to be doing better compared to last week even post debridement. He has shown signs of improvement he did test positive for Staphylococcus aureus. With that being said he notes that he's not have any discomfort and in general he does feel like things seem to been doing better. Fortunately there's no additional blistering and no evidence of remaining infection at this time. No fevers, chills, nausea, or vomiting noted at this time. He has three days of antibiotic left. 10/31/17 on evaluation today patient actually appears to be doing very well in regard to his ulcer on the left foot. He has been tolerating the dressing changes without complication. With that being said fortunately there appears to be no evidence of infection I do think he's made progress compared to previous. No fevers, chills, nausea, or vomiting noted at this time. 11/14/17 on evaluation today patient actually appears to be doing excellent in regard to his amputation site ulcer on his foot. In fact this appears to be completely healed at this point. There is no evidence of infection nor underline abscess and I did thoroughly evaluate the periwound location. Overall I'm very pleased with how things appear. Readmission: 01-03-2022 upon evaluation today patient appears for reevaluation here in the clinic although it has been since 2019 that I last saw him. This again has been over 4 years. With that being said he is having an issue with a wound on his foot unfortunately. He has not had any recent x-rays he tells me at this point which is unfortunate as avoid definitely can need to get something started that can be one of the primary items to get moving  forward with. With that being said I also think were probably can obtain a wound culture he is probably going to require some antibiotics at some point. I just want to make sure that we have him on the right thing when we do this. Currently the patient tells me that his wounds have been present in regard to the met head for about a month at this point. With that being said he also has a wound on the third toe which is the next toe this actually still present. This unfortunately has a lot of callus buildup around it but I think it is larger underneath than what it would appear on initial inspection. Patient does have a history of diabetes mellitus type 2, congestive heart failure, and COPD. 01-11-2022 upon evaluation patient's wounds actually are showing signs of doing about the same may be just slightly cleaner but definitely not where we want things to be as of yet. Fortunately there does not appear to be any signs of active infection locally nor systemically at this point which is great news. No fevers, chills, nausea, vomiting, or diarrhea. Of note patient has had his MRI but we do not have the results of that as of yet we are still waiting for the reading on this at this point. 01-18-2022 upon evaluation today patient presents after having had his  MRI finally complete. 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 The third and fourth digits seem to be early which is good news. Nonetheless we are still going to need to try to see about getting the infection under control he is already been on the antibiotic therapy which I think has done quite well. In fact I feel like he is showing signs of improvement already. 12/28; patient with a wound on the medial aspect of the left first metatarsal head and an area on the left third toe with slight hammer deformity. He has had a previous left first toe amputation. We are using Iodoflex on the  wound. He is a diabetic a recent MRI showed osteomyelitis we have been giving him Cipro and Doxy which he appears to be tolerating well. JSHAUN, ABERNATHY ([id number]) [id number] Page 9 of 11 02-10-2022 upon evaluation today patient unfortunately has issues still with open wounds of his foot on the left. He does have known osteomyelitis at this point and that is something that we have been treating with oral antibiotics. I actually have not seen him personally since 19 December. In that time he has not had any debridement from that point until today based on what I can see as best I can tell anyway. Has been on Cipro as well as doxycycline. Nonetheless he does still seem to have open wounds today and I think that he would benefit from hyperbaric oxygen therapy. I discussed that with him today as well. 02-18-2022 upon evaluation today patient appears to be doing about the same in regard to his wounds. Fortunately I do not see any signs of infection although he still has areas that seem to initially close down but that he has a lot of callus that still open at both locations most on the metatarsal region as well as the toe. I think that he is appropriate for proceeding with the hyperbarics and I think the sooner we get this done to try to get these wounds healed the better off he will be. Objective Constitutional Well-nourished and well-hydrated in no acute distress. Vitals Time Taken: 8:40 AM, Height: 74 in, Weight: 371 lbs, BMI: 47.6, Temperature: 98.1 F, Pulse: 81 bpm, Respiratory Rate: 16 breaths/min, Blood Pressure: 142/66 mmHg. Respiratory normal breathing without difficulty. Psychiatric this patient is able to make decisions and demonstrates good insight into disease process. Alert and Oriented x 3. pleasant and cooperative. General Notes: Upon inspection patient's wound bed actually showed signs of good granulation and some areas once I remove some of the  callus and necrotic tissue covering. However there is still some deep wounds both on the toe as well as a metatarsal location which I think would benefit from hyperbaric oxygen therapy. I discussed this with the patient as well and he does want to proceed as such. I am to get things set up he is going for his x-ray as well as his EKG later this morning and then hopefully will get everything back so that we can clear him for starting hyperbarics hopefully come Tuesday. Integumentary (Hair, Skin) Wound #2 status is Open. Original cause of wound was Gradually Appeared. The date acquired was: 12/13/2021. The wound has been in treatment 6 weeks. The wound is located on the Left,Medial Metatarsal head first. The wound measures 0.7cm length x 0.4cm width x 0.6cm depth; 0.22cm^2 area and 0.132cm^3 volume. There is Fat Layer (Subcutaneous Tissue) exposed. There is a medium  amount of serous drainage noted. The wound margin is flat and intact. There is no granulation within the wound bed. There is a large (67-100%) amount of necrotic tissue within the wound bed including Adherent Slough. Wound #3 status is Open. Original cause of wound was Footwear Injury. The date acquired was: 01/03/2022. The wound has been in treatment 6 weeks. The wound is located on the Left T Third. The wound measures 0.2cm length x 0.2cm width x 0.2cm depth; 0.031cm^2 area and 0.006cm^3 volume. There is Fat oe Layer (Subcutaneous Tissue) exposed. There is a medium amount of serous drainage noted. The wound margin is flat and intact. There is no granulation within the wound bed. There is a large (67-100%) amount of necrotic tissue within the wound bed. 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 There was a Excisional Skin/Subcutaneous Tissue Debridement with a total area of 0.28 sq cm performed by Tommie Sams., PA-C. With the following instrument(s): Curette to remove Viable and Non-Viable tissue/material. Material removed includes Callus, Subcutaneous Tissue, and Slough. No specimens were taken. A time out was conducted at 08:55, prior to the start of the procedure. A Moderate amount of bleeding was controlled with Pressure. The procedure was tolerated well. Post Debridement Measurements: 0.7cm length x 0.4cm width x 0.6cm depth; 0.132cm^3 volume. Character of Wound/Ulcer Post Debridement is stable. Severity of Tissue Post Debridement is: Fat layer exposed. Post procedure Diagnosis Wound #2: Same as Pre-Procedure Wound #3 Pre-procedure diagnosis of Wound #3 is a Diabetic Wound/Ulcer of the Lower Extremity located on the Left T Third .Severity of Tissue Pre Debridement is: oe Fat layer exposed. There was a Excisional Skin/Subcutaneous Tissue Debridement with a total area of 0.04 sq cm performed by Tommie Sams., PA-C. With the following instrument(s): Curette to remove Viable and Non-Viable tissue/material. Material removed includes Callus, Subcutaneous Tissue, and Slough. No specimens were taken. A time out was conducted at 08:55, prior to the start of the procedure. A Moderate amount of bleeding was controlled with Pressure. The procedure was tolerated well. Post Debridement Measurements: 0.6cm length x 0.4cm width x 0.2cm depth; 0.038cm^3 volume. MARVELLE, CAUDILL ([id number]) [id number] Page 10 of 11 Character of Wound/Ulcer Post Debridement is stable. Severity of Tissue Post  Debridement is: Fat layer exposed. Post procedure Diagnosis Wound #3: Same as Pre-Procedure Plan Follow-up Appointments: Wound #2 Left,Medial Metatarsal head first: Return Appointment in 1 week. Wound #3 Left T Third: oe Return Appointment in 1 week. Bathing/ Shower/ Hygiene: Wash wounds with antibacterial soap and water. May shower; gently cleanse wound with antibacterial soap, rinse and pat dry prior to dressing wounds Off-Loading: Open toe surgical shoe Hyperbaric Oxygen Therapy: Wound #2 Left,Medial Metatarsal head first: Evaluate for HBO Therapy Indication and location: - [place] Grade 3 ulcers of the left foot and 3rd toe If appropriate for treatment, begin HBOT per protocol: 2.0 ATA for 90 Minutes without Air Breaks One treatment per day (delivered Monday through Friday unless otherwise specified in Special Instructions below): T # of Treatments: - 40 otal Antihistamine 30 minutes prior to HBO Treatment, difficulty clearing ears.  Finger stick Blood Glucose Pre- and Post- HBOT Treatment. Follow Hyperbaric Oxygen Glycemia Protocol Wound #3 Left T Third: oe Evaluate for HBO Therapy Indication and location: - [place] Grade 3 ulcers of the left foot and 3rd toe If appropriate for treatment, begin HBOT per protocol: 2.0 ATA for 90 Minutes without Air Breaks One treatment per day (delivered Monday through Friday unless otherwise specified in Special Instructions below): T # of Treatments: - 40 otal Antihistamine 30 minutes prior to HBO Treatment, difficulty clearing ears. Finger stick Blood Glucose Pre- and Post- HBOT Treatment. Follow Hyperbaric Oxygen Glycemia Protocol WOUND #2: - Metatarsal head first Wound Laterality: Left, Medial Cleanser: Byram Ancillary Kit - 15 Day Supply (Generic) 3 x Per Week/30 Days Discharge Instructions: Use supplies as instructed; Kit contains: (15) Saline Bullets; (15) 3x3 Gauze; 15 pr Gloves Cleanser: Soap and Water 3 x Per Week/30  Days Discharge Instructions: Gently cleanse wound with antibacterial soap, rinse and pat dry prior to dressing wounds Prim Dressing: AquacelAg Advantage Dressing, 2X2 (in/in) 3 x Per Week/30 Days ary Discharge Instructions: Apply to wound as directed Secondary Dressing: Kerlix 4.5 x 4.1 (in/yd) 3 x Per Week/30 Days Discharge Instructions: Apply Kerlix 4.5 x 4.1 (in/yd) as instructed Secured With: Medipore T - 58M Medipore H Soft Cloth Surgical T ape ape, 2x2 (in/yd) (Generic) 3 x Per Week/30 Days WOUND #3: - T Third Wound Laterality: Left oe Cleanser: Byram Ancillary Kit - 15 Day Supply (Generic) 3 x Per Week/30 Days Discharge Instructions: Use supplies as instructed; Kit contains: (15) Saline Bullets; (15) 3x3 Gauze; 15 pr Gloves Cleanser: Soap and Water 3 x Per Week/30 Days Discharge Instructions: Gently cleanse wound with antibacterial soap, rinse and pat dry prior to dressing wounds Prim Dressing: AquacelAg Advantage Dressing, 2X2 (in/in) 3 x Per Week/30 Days ary Discharge Instructions: Apply to wound as directed Secondary Dressing: Kerlix 4.5 x 4.1 (in/yd) 3 x Per Week/30 Days Discharge Instructions: Apply Kerlix 4.5 x 4.1 (in/yd) as instructed Secured With: Medipore T - 58M Medipore H Soft Cloth Surgical T ape ape, 2x2 (in/yd) (Generic) 3 x Per Week/30 Days 1. I would recommend currently that we have the patient continue currently with a silver alginate dressing which I think should do a good job here for the wound. 2. I am also can recommend that we have the patient continue with the ABD pad and roll gauze to secure in place. 3. I am also can recommend continued and appropriate offloading. Will try to do that as much as possible. 4. I would also recommend that we proceed with HBO therapy as soon as we can get clearance from his chest x-ray and EKG I think we should go ahead and initiate treatment as soon as possible I am hoping next Tuesday will be able to get him started. We will see  patient back for reevaluation in 1 week here in the clinic. If anything worsens or changes patient will contact our office for additional recommendations. Electronic Signature(s) Signed: 02/18/2022 10:47:41 AM By: [name] PA-C Entered By: [name] on 02/18/2022 10:47:41 [name] ([id number]) [id number] Page 11 of 11 -------------------------------------------------------------------------------- SuperBill Details Patient Name: Date of Service: Raymond [name] 02/18/2022 Medical Record Number: [id number] Patient Account Number: [id number] Date of Birth/Sex: Treating RN: 1965-08-30 (57 y.o. [name] Primary Care Provider: [name] Other Clinician: Referring Provider: Treating Provider/Extender: [name] Weeks in Treatment: 6 Diagnosis Coding ICD-10 Codes Code Description [phone] Chronic multifocal osteomyelitis, left  ankle and foot E11.621 Type 2 diabetes mellitus with foot ulcer L97.522 Non-pressure chronic ulcer of other part of left foot with fat layer exposed I50.42 Chronic combined systolic (congestive) and diastolic (congestive) heart failure J44.9 Chronic obstructive pulmonary disease, unspecified Facility Procedures : CPT4 Code: [id number] Description: [facility] - DEB SUBQ TISSUE 20 SQ CM/< ICD-10 Diagnosis Description L97.522 Non-pressure chronic ulcer of other part of left foot with fat layer exposed Modifier: Quantity: 1 Physician Procedures : CPT4 Code Description Modifier [id number] - WC PHYS SUBQ TISS 20 SQ CM ICD-10 Diagnosis Description L97.522 Non-pressure chronic ulcer of other part of left foot with fat layer exposed Quantity: 1 Electronic Signature(s) Signed: 02/18/2022 10:48:26 AM By: [name] PA-C Entered By: [name] on 02/18/2022 10:48:26

## 2022-02-22 ENCOUNTER — Encounter: Payer: Medicare HMO | Admitting: Physician Assistant

## 2022-02-22 LAB — GLUCOSE, CAPILLARY
Glucose-Capillary: 74 mg/dL (ref 70–99)
Glucose-Capillary: 93 mg/dL (ref 70–99)

## 2022-02-22 NOTE — Progress Notes (Signed)
KEYAAN, LEDERMAN ([id number]) [id number] Page 1 of 2 Visit Report for 02/22/2022 Arrival Information Details Patient Name: Date of Service: Raymond [name] 02/22/2022 10:00 A M Medical Record Number: [id number] Patient Account Number: [id number] Date of Birth/Sex: Treating RN: [birth year] (57 y.o. [name], [name] Primary Care Bryn Perkin: [name] Other Clinician: [name] Referring Dolores Mcgovern: Treating Raymond [name]/Extender: Raymond [name] in Treatment: 7 Visit Information History Since Last Visit Added or deleted any medications: No Patient Arrived: Ambulatory Any new allergies or adverse reactions: No Arrival Time: 09:25 Had a fall or experienced change in No Accompanied By: transportation activities of daily living that may affect Transfer Assistance: None risk of falls: Patient Identification Verified: Yes Signs or symptoms of abuse/neglect since No Secondary Verification Process Completed: Yes last visito Patient Requires Transmission-Based Precautions: No Hospitalized since last visit: No Patient Has Alerts: Yes Implantable device outside of the clinic No Patient Alerts: Type II Diabetic excluding Aspirin '81mg'$  cellular tissue based products placed in the ABI L [place]>220 center since last visit: Has Dressing in Place as Prescribed: Yes Has Footwear/Offloading in Place as Yes Prescribed: Left: Surgical Shoe with Pressure Relief Insole Pain Present Now: No Electronic Signature(s) Signed: 02/22/2022 2:18:32 PM By: [name] RCP, RRT CHT , , Entered By: [name] on 02/22/2022 14:12:42 , -------------------------------------------------------------------------------- Vitals Details Patient Name: Date of Service: Raymond [name], Raymond Negus D. 02/22/2022 10:00 A M Medical Record Number: [id number] Patient Account Number: [id number] Date of Birth/Sex: Treating RN: [birth year] (57 y.o. [name] Primary Care Donya Hitch: [name] Other Clinician: [name] Referring Niquan Charnley: Treating Raymond [name]/Extender: [name] Weeks in Treatment: 7 Vital Signs Time Taken: 09:49 Temperature (F): 98.4 Height (in): 74 Pulse (bpm): [address] ([id number]) [id number] Page 2 of 2 Weight (lbs): 371 Respiratory Rate (breaths/min): 18 Body Mass Index (BMI): 47.6 Blood Pressure (mmHg): 120/62 Capillary Blood Glucose (mg/dl): 96 Reference Range: 80 - 120 mg / dl Notes Patient had a blood glucose of 93 mg/dL and was given Ensure as is protocol. Thirty minutes later he was rechecked as is protocol and the reading was 74 mg/dL. [name], III PA-C made the call to cancel to forgo HBO today as this would have been the patient's first dive and he did not eat this morning before coming in for HBO. Electronic Signature(s) Signed: 02/22/2022 2:18:32 PM By: [name] RCP, RRT CHT , , Entered By: [name] on 02/22/2022 14:18:06 ,

## 2022-02-23 ENCOUNTER — Encounter (HOSPITAL_BASED_OUTPATIENT_CLINIC_OR_DEPARTMENT_OTHER): Payer: Medicare HMO | Admitting: Internal Medicine

## 2022-02-23 DIAGNOSIS — E11621 Type 2 diabetes mellitus with foot ulcer: Secondary | ICD-10-CM | POA: Diagnosis not present

## 2022-02-23 DIAGNOSIS — L97522 Non-pressure chronic ulcer of other part of left foot with fat layer exposed: Secondary | ICD-10-CM

## 2022-02-23 DIAGNOSIS — M86372 Chronic multifocal osteomyelitis, left ankle and foot: Secondary | ICD-10-CM | POA: Diagnosis not present

## 2022-02-23 LAB — GLUCOSE, CAPILLARY
Glucose-Capillary: 165 mg/dL — ABNORMAL HIGH (ref 70–99)
Glucose-Capillary: 154 mg/dL — ABNORMAL HIGH (ref 70–99)

## 2022-02-23 NOTE — Progress Notes (Signed)
MIKLE, STERNBERG ([id number]) [id number] Page 1 of 2 Visit Report for 02/23/2022 Arrival Information Details Patient Name: Date of Service: Raymond [name] 02/23/2022 10:30 A M Medical Record Number: [id number] Patient Account Number: [id number] Date of Birth/Sex: Treating RN: [birth year] (57 y.o. [name], [name] Primary Care Ari Bernabei: [name] Other Clinician: [name] Referring Rosamund Nyland: Treating Dennies Coate/Extender: [name] in Treatment: 7 Visit Information History Since Last Visit Added or deleted any medications: No Patient Arrived: Ambulatory Any new allergies or adverse reactions: No Arrival Time: 10:45 Had a fall or experienced change in No Accompanied By: self activities of daily living that may affect Transfer Assistance: None risk of falls: Patient Identification Verified: Yes Signs or symptoms of abuse/neglect since No Secondary Verification Process Completed: Yes last visito Patient Requires Transmission-Based Precautions: No Hospitalized since last visit: No Patient Has Alerts: Yes Implantable device outside of the clinic No Patient Alerts: Type II Diabetic excluding Aspirin '81mg'$  cellular tissue based products placed in the ABI L [place]>220 center since last visit: Has Dressing in Place as Prescribed: Yes Has Footwear/Offloading in Place as Yes Prescribed: Left: Surgical Shoe with Pressure Relief Insole Pain Present Now: No Electronic Signature(s) Signed: 02/23/2022 2:06:25 PM By: [name] RCP, RRT CHT , , Entered By: [name] on 02/23/2022 11:17:27 , -------------------------------------------------------------------------------- Encounter Discharge Information Details Patient Name: Date of Service: Raymond [name], Raymond Negus D. 02/23/2022 10:30 A M Medical Record Number: [id number] Patient Account Number: [id number] Date of Birth/Sex: Treating  RN: 1965-12-17 (57 y.o. [name], [name] Primary Care Ethelwyn Gilbertson: [name] Other Clinician: [name] Referring Geronimo Diliberto: Treating Christophe Rising/Extender: [name] in Treatment: 7 Encounter Discharge Information Items Discharge Condition: Stable Ambulatory Status: Ambulatory Discharge Destination: Home KAREEN, HITSMAN ([id number]) [id number] Page 2 of 2 Transportation: Other Accompanied By: transportation Schedule Follow-up Appointment: Yes Clinical Summary of Care: Notes Patient has an HBO treatment scheduled on 02/24/22 at 10:00 am/ Electronic Signature(s) Signed: 02/23/2022 2:06:25 PM By: [name] RCP, RRT CHT , , Entered By: [name] on 02/23/2022 14:01:26 , -------------------------------------------------------------------------------- Vitals Details Patient Name: Date of Service: Raymond [name], Raymond Negus D. 02/23/2022 10:30 A M Medical Record Number: [id number] Patient Account Number: [id number] Date of Birth/Sex: Treating RN: 1965-07-20 (57 y.o. [name], [name] Primary Care Amarianna Abplanalp: [name] Other Clinician: [name] Referring Hema Lanza: Treating Syndi Pua/Extender: [name] in Treatment: 7 Vital Signs Time Taken: 10:45 Temperature (F): 98.5 Height (in): 74 Pulse (bpm): 72 Weight (lbs): 371 Respiratory Rate (breaths/min): 18 Body Mass Index (BMI): 47.6 Blood Pressure (mmHg): 130/70 Capillary Blood Glucose (mg/dl): 165 Reference Range: 80 - 120 mg / dl Electronic Signature(s) Signed: 02/23/2022 2:06:25 PM By: [name] RCP, RRT CHT , , Entered By: [name] on 02/23/2022 11:18:36 ,

## 2022-02-23 NOTE — Progress Notes (Signed)
Raymond, [name] ([id number]) [id number] Page 1 of 2 Visit Report for 02/23/2022 HBO Details Patient Name: Date of Service: Raymond [name]. 02/23/2022 10:30 A M Medical Record Number: [id number] Patient Account Number: [id number] Date of Birth/Sex: Treating RN: [birth year] (57 y.o. 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 HBO Treatment Details Treatment Number: 1 Patient Type: Outpatient Chamber Type: Monoplace Chamber Serial #: [id number] Treatment Protocol: 2.0 ATA with 90 minutes oxygen, and no air breaks Treatment Details Compression Rate Down: 1.0 psi / minute De-Compression Rate Up: 1.5 psi / minute Air breaks and breathing Decompress Decompress Compress Tx Pressure Begins Reached periods Begins Ends (leave unused spaces blank) Chamber Pressure (ATA 1 2 ------2 1 ) Clock Time (24 hr) 11:10 11:25 - - - - - - 12:55 13:03 Treatment Length: 113 (minutes) Treatment Segments: 4 Vital Signs Capillary Blood Glucose Reference Range: 80 - 120 mg / dl HBO Diabetic Blood Glucose Intervention Range: <131 mg/dl or >249 mg/dl Type: Time Vitals Blood Pulse: Respiratory Temperature: Capillary Blood Glucose Pulse Action Taken: Pressure: Rate: Glucose (mg/dl): Meter #: Oximetry (%) Taken: Pre 10:45 130/70 72 18 98.5 165 1 None per protocol; DO made aware Post 13:09 122/62 60 18 98.5 154 1 none per protocol Treatment Response Treatment Toleration: Well Treatment Completion Status: Treatment Completed without Adverse Event HBO Attestation I certify that I supervised this HBO  treatment in accordance with Medicare guidelines. A trained emergency response team is readily available per Yes hospital policies and procedures. Continue HBOT as ordered. Yes Electronic Signature(s) Signed: 02/23/2022 4:31:24 PM By: [name] DO Previous Signature: 02/23/2022 2:06:25 PM Version By: [name] RCP, RRT CHT , , Entered By: [name] on 02/23/2022 15:16:49 [name] ([id number]) [id number] Page 2 of 2 -------------------------------------------------------------------------------- HBO Safety Checklist Details Patient Name: Date of Service: Raymond [name] 02/23/2022 10:30 A M Medical Record Number: [id number] Patient Account Number: [id number] Date of Birth/Sex: Treating RN: [birth year] (57 y.o. Raymond [name], Raymond [name] Primary Care Ahmet Schank: [name] Other Clinician: [name] Referring [name]: Treating [name]/Extender: [name] in Treatment: 7 HBO Safety Checklist Items Safety Checklist Consent Form Signed Patient voided / foley secured and emptied When did you last eato 02/23/22 am Last dose of injectable or oral agent 02/23/22 am metformin Ostomy pouch emptied and vented if applicable NA All implantable devices assessed, documented and approved NA Intravenous access site secured and place NA Valuables secured Linens and cotton and cotton/polyester blend (less than 51% polyester) Personal oil-based products / skin lotions / body lotions removed Wigs or hairpieces removed NA Smoking or tobacco materials removed NA Books / newspapers / magazines / loose paper removed NA Cologne, aftershave, perfume and deodorant removed Jewelry removed (may wrap wedding band) Make-up removed NA Hair care products removed Battery operated devices (external) removed NA Heating patches and chemical warmers removed NA Titanium eyewear removed NA Nail polish cured  greater than 10 hours NA Casting material cured greater than 10 hours NA Hearing aids removed NA Loose dentures or partials removed NA Prosthetics have been removed NA Patient demonstrates correct use of air break device (if applicable) Patient concerns have been addressed Patient grounding bracelet on and cord attached  to chamber Specifics for Inpatients (complete in addition to above) Medication sheet sent with patient Intravenous medications needed or due during therapy sent with patient Drainage tubes (e.g. nasogastric tube or chest tube secured and vented) Endotracheal or Tracheotomy tube secured Cuff deflated of air and inflated with saline Airway suctioned Electronic Signature(s) Signed: 02/23/2022 2:06:25 PM By: [name] RCP, RRT CHT , , Entered By: [name] on 02/23/2022 11:20:20 ,

## 2022-02-24 ENCOUNTER — Encounter: Payer: Medicare HMO | Admitting: Internal Medicine

## 2022-02-24 DIAGNOSIS — E11621 Type 2 diabetes mellitus with foot ulcer: Secondary | ICD-10-CM | POA: Diagnosis not present

## 2022-02-24 LAB — GLUCOSE, CAPILLARY
Glucose-Capillary: 243 mg/dL — ABNORMAL HIGH (ref 70–99)
Glucose-Capillary: 186 mg/dL — ABNORMAL HIGH (ref 70–99)

## 2022-02-24 NOTE — Progress Notes (Signed)
HIKEEM, ANDERSSON ([id number]) [id number] Page 1 of 2 Visit Report for 02/24/2022 HBO Details Patient Name: Date of Service: Raymond [name]. 02/24/2022 10:30 A M Medical Record Number: [id number] Patient Account Number: [id number] Date of Birth/Sex: Treating RN: Jun 09, 1965 (57 y.o. 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 HBO Treatment Details Treatment Number: 2 Patient Type: Outpatient Chamber Type: Monoplace Chamber Serial #: [id number] Treatment Protocol: 2.0 ATA with 90 minutes oxygen, and no air breaks Treatment Details Compression Rate Down: 1.0 psi / minute De-Compression Rate Up: 1.5 psi / minute Air breaks and breathing Decompress Decompress Compress Tx Pressure Begins Reached periods Begins Ends (leave unused spaces blank) Chamber Pressure (ATA 1 2 ------2 1 ) Clock Time (24 hr) 10:18 10:35 - - - - - - 12:05 12:15 Treatment Length: 117 (minutes) Treatment Segments: 4 Vital Signs Capillary Blood Glucose Reference Range: 80 - 120 mg / dl HBO Diabetic Blood Glucose Intervention Range: <131 mg/dl or >249 mg/dl Type: Time Vitals Blood Respiratory Capillary Blood Glucose Pulse Action Pulse: Temperature: Taken: Pressure: Rate: Glucose (mg/dl): Meter #: Oximetry (%) Taken: Pre 09:51 144/66 72 18 98.2 243 1 none per protocol Post 12:20 132/64 66 18 97.6 186 1 none per protocol Treatment Response Treatment Toleration: Well Treatment Completion Status: Treatment Completed without Adverse Event Victoria Euceda Notes No concerns with treatment given HBO  Attestation I certify that I supervised this HBO treatment in accordance with Medicare guidelines. A trained emergency response team is readily available per Yes hospital policies and procedures. Continue HBOT as ordered. Yes Electronic Signature(s) DACOTA, RUBEN ([id number]) [id number] Page 2 of 2 Signed: 02/24/2022 4:38:56 PM By: Linton Ham MD Previous Signature: 02/24/2022 12:37:39 PM Version By: [name] RCP, RRT CHT , , Entered By: Linton Ham on 02/24/2022 16:13:18 -------------------------------------------------------------------------------- HBO Safety Checklist Details Patient Name: Date of Service: [name], [name] 02/24/2022 10:30 A M Medical Record Number: [id number] Patient Account Number: [id number] Date of Birth/Sex: Treating RN: 19-Jan-1966 (57 y.o. [name], [name] Primary Care Raheem Kolbe: [name] Other Clinician: [name] Referring Cherysh Epperly: Treating Melvenia Favela/Extender: Eldridge Dace, MICHA EL Glenna Fellows in Treatment: 7 HBO Safety Checklist Items Safety Checklist Consent Form Signed Patient voided / foley secured and emptied When did you last eato 08:30 am Last dose of injectable or oral agent 02/24/22 metformin Ostomy pouch emptied and vented if applicable NA All implantable devices assessed, documented and approved NA Intravenous access site secured and place NA Valuables secured Linens and cotton and cotton/polyester blend (less than 51% polyester) Personal oil-based products / skin lotions / body lotions removed Wigs or hairpieces removed NA Smoking or tobacco materials removed NA Books / newspapers / magazines / loose paper removed NA Cologne, aftershave, perfume and deodorant removed Jewelry removed (may wrap wedding band) Make-up removed NA Hair care products removed Battery operated devices (external) removed NA Heating patches and chemical warmers  removed NA Titanium eyewear removed NA Nail polish cured greater than 10 hours NA Casting material cured greater than 10 hours NA Hearing aids removed NA Loose dentures or partials removed NA Prosthetics have been removed NA Patient demonstrates correct use of air break device (if applicable) Patient  concerns have been addressed Patient grounding bracelet on and cord attached to chamber Specifics for Inpatients (complete in addition to above) Medication sheet sent with patient Intravenous medications needed or due during therapy sent with patient Drainage tubes (e.g. nasogastric tube or chest tube secured and vented) Endotracheal or Tracheotomy tube secured Cuff deflated of air and inflated with saline Airway suctioned Electronic Signature(s) Signed: 02/24/2022 12:37:39 PM By: [name] RCP, RRT CHT , , Entered By: [name] on 02/24/2022 11:37:23 ,

## 2022-02-24 NOTE — Progress Notes (Signed)
CHAYSON, CHARTERS ([id number]) [id number] Page 1 of 2 Visit Report for 02/24/2022 Arrival Information Details Patient Name: Date of Service: Raymond [name] 02/24/2022 10:30 A M Medical Record Number: [id number] Patient Account Number: [id number] Date of Birth/Sex: Treating RN: 01/09/1966 (57 y.o. [name], [name] Primary Care Emmily Pellegrin: [name] Other Clinician: [name] Referring Yovani Cogburn: Treating Dane Bloch/Extender: Eldridge Dace, MICHA EL Glenna Fellows in Treatment: 7 Visit Information History Since Last Visit Added or deleted any medications: No Patient Arrived: Ambulatory Any new allergies or adverse reactions: No Arrival Time: 09:48 Had a fall or experienced change in No Accompanied By: self activities of daily living that may affect Transfer Assistance: None risk of falls: Patient Identification Verified: Yes Signs or symptoms of abuse/neglect since No Secondary Verification Process Completed: Yes last visito Patient Requires Transmission-Based Precautions: No Hospitalized since last visit: No Patient Has Alerts: Yes Implantable device outside of the clinic No Patient Alerts: Type II Diabetic excluding Aspirin '81mg'$  cellular tissue based products placed in the ABI L [place]>220 center since last visit: Has Dressing in Place as Prescribed: Yes Has Footwear/Offloading in Place as Yes Prescribed: Left: Surgical Shoe with Pressure Relief Insole Pain Present Now: No Electronic Signature(s) Signed: 02/24/2022 12:37:39 PM By: [name] RCP, RRT CHT , , Entered By: [name] on 02/24/2022 11:30:26 , -------------------------------------------------------------------------------- Encounter Discharge Information Details Patient Name: Date of Service: Raymond [name], Raymond Negus D. 02/24/2022 10:30 A M Medical Record Number: [id number] Patient Account Number: [id number] Date of Birth/Sex: Treating  RN: [birth year] (57 y.o. [name], [name] Primary Care Hendry Speas: [name] Other Clinician: [name] Referring Blaike Newburn: Treating Cleva Camero/Extender: RO BSO Delane Ginger, MICHA EL Glenna Fellows in Treatment: 7 Encounter Discharge Information Items Discharge Condition: Stable Ambulatory Status: Ambulatory Discharge Destination: Home MARBIN, OLSHEFSKI ([id number]) [id number] Page 2 of 2 Transportation: Other Accompanied By: transportation Schedule Follow-up Appointment: Yes Clinical Summary of Care: Notes Patient has an HBO treatment scheduled on 02/25/22 at 10:00 am. Electronic Signature(s) Signed: 02/24/2022 12:37:39 PM By: [name] RCP, RRT CHT , , Entered By: [name] on 02/24/2022 12:37:17 , -------------------------------------------------------------------------------- Vitals Details Patient Name: Date of Service: Raymond [name], Raymond Negus D. 02/24/2022 10:30 A M Medical Record Number: [id number] Patient Account Number: [id number] Date of Birth/Sex: Treating RN: 24-Sep-1965 (57 y.o. [name], [name] Primary Care Keelee Yankey: [name] Other Clinician: [name] Referring Davona Kinoshita: Treating Kavita Bartl/Extender: Eldridge Dace, MICHA EL Glenna Fellows in Treatment: 7 Vital Signs Time Taken: 09:51 Temperature (F): 98.2 Height (in): 74 Pulse (bpm): 72 Weight (lbs): 371 Respiratory Rate (breaths/min): 18 Body Mass Index (BMI): 47.6 Blood Pressure (mmHg): 144/66 Capillary Blood Glucose (mg/dl): 243 Reference Range: 80 - 120 mg / dl Electronic Signature(s) Signed: 02/24/2022 12:37:39 PM By: [name] RCP, RRT CHT , , Entered By: [name] on 02/24/2022 11:31:37 ,

## 2022-02-25 ENCOUNTER — Encounter: Payer: Medicare HMO | Admitting: Physician Assistant

## 2022-02-25 DIAGNOSIS — E11621 Type 2 diabetes mellitus with foot ulcer: Secondary | ICD-10-CM | POA: Diagnosis not present

## 2022-02-25 LAB — GLUCOSE, CAPILLARY
Glucose-Capillary: 355 mg/dL — ABNORMAL HIGH (ref 70–99)
Glucose-Capillary: 256 mg/dL — ABNORMAL HIGH (ref 70–99)

## 2022-02-25 NOTE — Progress Notes (Signed)
REDA, GETTIS ([id number]) [id number] Page 1 of 2 Visit Report for 02/25/2022 HBO Details Patient Name: Date of Service: Raymond [name]. 02/25/2022 10:00 [place] Record Number: [id number] Patient Account Number: [id number] Date of Birth/Sex: Treating RN: Oct 23, 1965 (57 y.o. 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 HBO Treatment Details Treatment Number: 3 Patient Type: Outpatient Chamber Type: Monoplace Chamber Serial #: [id number] Treatment Protocol: 2.0 ATA with 90 minutes oxygen, and no air breaks Treatment Details Compression Rate Down: 1.0 psi / minute De-Compression Rate Up: 1.5 psi / minute Air breaks and breathing Decompress Decompress Compress Tx Pressure Begins Reached periods Begins Ends (leave unused spaces blank) Chamber Pressure (ATA 1 2 ------2 1 ) Clock Time (24 hr) 10:45 11:00 - - - - - - 12:30 12:42 Treatment Length: 117 (minutes) Treatment Segments: 4 Vital Signs Capillary Blood Glucose Reference Range: 80 - 120 mg / dl HBO Diabetic Blood Glucose Intervention Range: <131 mg/dl or >249 mg/dl Type: Time Vitals Blood Respiratory Capillary Blood Glucose Pulse Action Pulse: Temperature: Taken: Pressure: Rate: Glucose (mg/dl): Meter #: Oximetry (%) Taken: Pre 10:07 131/78 80 18 97.4 355 1 none per protocol Post 12:47 122/64 84 18 97.4 256 1 none per protocol Treatment Response Treatment Toleration: Well Treatment Completion Status: Treatment Completed without Adverse Event Electronic Signature(s) Signed: 02/25/2022 1:11:50 PM By: [name] RCP, RRT CHT , , Signed: 02/25/2022 2:17:33 PM By: [name] PA-C Entered By: [name] on 02/25/2022 13:08:40 , [name] ([id number]) [id number] Page 2 of 2 -------------------------------------------------------------------------------- HBO Safety Checklist Details Patient Name: Date of Service: Raymond [name] 02/25/2022 10:00 A M Medical Record Number: [id number] Patient Account Number: [id number] Date of Birth/Sex: Treating RN: [birth year] (57 y.o. [name], [name] Primary Care Navid Lenzen: [name] Other Clinician: [name] Referring Asli Tokarski: Treating Duyen Beckom/Extender: [name] Weeks in Treatment: 7 HBO Safety Checklist Items Safety Checklist Consent Form Signed Patient voided / foley secured and emptied When did you last eato n/a Last dose of injectable or oral agent n/a Ostomy pouch emptied and vented if applicable NA All implantable devices assessed, documented and approved NA Intravenous access site secured and place NA Valuables secured Linens and cotton and cotton/polyester blend (less than 51% polyester) Personal oil-based products / skin lotions / body lotions removed Wigs or hairpieces removed NA Smoking or tobacco materials removed NA Books / newspapers / magazines / loose paper removed NA Cologne, aftershave, perfume and deodorant removed Jewelry removed (may wrap wedding band) Make-up removed NA Hair care products removed Battery operated devices (external) removed NA Heating patches and chemical warmers removed NA Titanium eyewear removed NA Nail polish cured greater than 10 hours NA Casting material cured greater than 10 hours NA Hearing aids removed NA Loose dentures or partials removed NA Prosthetics have been removed NA Patient demonstrates correct use of air break device (if applicable) Patient concerns have been addressed Patient  grounding bracelet on and cord attached to chamber Specifics for Inpatients (complete in addition to above) Medication sheet sent with patient Intravenous medications needed or due during therapy sent with patient Drainage tubes (e.g. nasogastric tube or chest tube secured and vented) Endotracheal or Tracheotomy  tube secured Cuff deflated of air and inflated with saline Airway suctioned Electronic Signature(s) Signed: 02/25/2022 1:11:50 PM By: [name] RCP, RRT CHT , , Entered By: [name] on 02/25/2022 12:11:55 ,

## 2022-02-25 NOTE — Progress Notes (Signed)
BRANNEN, KOPPEN ([id number]) [id number] Page 1 of 11 Visit Report for 02/25/2022 Chief Complaint Document Details Patient Name: Date of Service: Raymond [name] 02/25/2022 9:15 A M Medical Record Number: [id number] Patient Account Number: [id number] Date of Birth/Sex: Treating RN: 04-24-1965 (57 y.o. [name] Primary Care Provider: [name] Other Clinician: Referring Provider: Treating Provider/Extender: Hazle Coca in Treatment: 7 Information Obtained from: Patient Chief Complaint Left foot ulcer with chronic osteomyelitis Electronic Signature(s) Signed: 02/25/2022 9:44:15 AM By: [name] PA-C Entered By: [name] on 02/25/2022 09:44:15 -------------------------------------------------------------------------------- Debridement Details Patient Name: Date of Service: Raymond Eke D. 02/25/2022 9:15 A M Medical Record Number: [id number] Patient Account Number: [id number] Date of Birth/Sex: Treating RN: 04-Jun-1965 (57 y.o. [name] Primary Care Provider: [name] Other Clinician: Referring Provider: Treating Provider/Extender: [name] Weeks in Treatment: 7 Debridement Performed for Assessment: Wound #3 Left T Third oe Performed By: Physician Tommie Sams., PA-C Debridement Type: Debridement Severity of Tissue Pre Debridement: Fat layer exposed Level of Consciousness (Pre-procedure): Awake and Alert Pre-procedure Verification/Time Out No Taken: Start Time: 09:46 T Area Debrided (L x W): otal 0.7 (cm) x 0.7 (cm) = 0.49 (cm) Tissue and other material debrided: Viable, Non-Viable, Callus Level: Non-Viable Tissue Debridement Description: Selective/Open Wound Instrument: Curette Bleeding: Minimum Hemostasis Achieved: Pressure Response to Treatment: Procedure was tolerated well Level of Consciousness (Post- Awake and  Alert procedure): Raymond [name], Raymond [name] ([id number]) [id number] Page 2 of 11 Post Debridement Measurements of Total Wound Length: (cm) 0.7 Width: (cm) 0.7 Depth: (cm) 0.2 Volume: (cm) 0.077 Character of Wound/Ulcer Post Debridement: Requires Further Debridement Severity of Tissue Post Debridement: Fat layer exposed Post Procedure Diagnosis Same as Pre-procedure Electronic Signature(s) Signed: 02/25/2022 12:44:53 PM By: Rosalio Loud MSN RN CNS WTA Signed: 02/25/2022 2:17:33 PM By: [name] PA-C Entered By: Rosalio Loud on 02/25/2022 09:48:36 -------------------------------------------------------------------------------- Debridement Details Patient Name: Date of Service: Raymond Eke D. 02/25/2022 9:15 A M Medical Record Number: [id number] Patient Account Number: [id number] Date of Birth/Sex: Treating RN: 1965-06-14 (57 y.o. [name] Primary Care Provider: [name] Other Clinician: Referring Provider: Treating Provider/Extender: [name] Weeks in Treatment: 7 Debridement Performed for Assessment: Wound #2 Left,Medial Metatarsal head first Performed By: Physician Tommie Sams., PA-C Debridement Type: Debridement Severity of Tissue Pre Debridement: Fat layer exposed Level of Consciousness (Pre-procedure): Awake and Alert Pre-procedure Verification/Time Out No Taken: Start Time: 09:46 T Area Debrided (L x W): otal 0.5 (cm) x 0.2 (cm) = 0.1 (cm) Tissue and other material debrided: Viable, Non-Viable, Callus Level: Non-Viable Tissue Debridement Description: Selective/Open Wound Instrument: Curette Bleeding: Minimum Hemostasis Achieved: Silver Nitrate Response to Treatment: Procedure was tolerated well Level of Consciousness (Post- Awake and Alert procedure): Post Debridement Measurements of Total Wound Length: (cm) 0.5 Width: (cm) 0.3 Depth: (cm) 0.3 Volume: (cm) 0.035 Character of Wound/Ulcer  Post Debridement: Requires Further Debridement Severity of Tissue Post Debridement: Fat layer exposed Post Procedure Diagnosis Same as Pre-procedure Electronic Signature(s) Signed: 02/25/2022 12:44:53 PM By: Rosalio Loud MSN RN CNS WTA Signed: 02/25/2022 2:17:33 PM By: [name] PA-C Entered By: Rosalio Loud on 02/25/2022 09:48:48 Raymond [name] ([id number]) [id number] Page 3 of 11 -------------------------------------------------------------------------------- HPI Details Patient Name: Date of Service: Raymond [name] 02/25/2022 9:15 A M Medical Record Number: [id number] Patient Account Number: [id number] Date of Birth/Sex: Treating RN: 1965/07/11 (57 y.o. [name] Primary Care Provider: [name]  Other Clinician: Referring Provider: Treating Provider/Extender: Hazle Coca in Treatment: 7 History of Present Illness HPI Description: 09/05/17-He is seeing an initial evaluation for a left plantar foot ulcer. He has a remote history of left great toe amputation. He states that 4-6 weeks ago he noted callus formation and ulceration. He has not seen primary care regarding this. He is not currently on antibiotic therapy. He does not routinely follow with podiatry. He states diabetic foot wear will arrive early next week. The EHR shows an A1c of 9% approximate 4 months ago but he states he had one and primary care a few weeks ago but does not know the results. He is neuropathic and does not complain of any pain, he is currently wearing crocs. 09/12/17-he is here in follow up evaluation for left plantar foot ulcer. There is improvement in both appearance and measurement. We will continue with same treatment plan and he will follow up next week. 09/19/17 on evaluation today patient actually appears to be doing excellent in regard to his ulcer on the invitation site of his left great foot plantar aspect. He's been  tolerating the dressing changes without complication. In fact with the Prisma and the current measures he has been shown signs of excellent improvement week by week up to this point. We have been to breeding the wound in this seems to have been of great benefit for him. Fortunately there is no evidence of infection. 09/26/17 on evaluation today patient appears to be doing rather well in regard to his wound. He did not know quite as much improvement this week as compared to last week. Nonetheless he still continues to show signs of improving to some degree. I do believe he may benefit from an offloading shoe. No fevers, chills, nausea, or vomiting noted at this time. 10/03/17 on evaluation today patient actually appears to be doing much better in regard to the amputation site plantar foot ulcer. Overall this appears significantly smaller even compared to previous. He's been tolerating the dressing changes without complication. He did get his diabetic shoes and I did have a look at them they appear to be fairly good. Nonetheless I do think that for the time being I would probably recommend he continue with the offloading shoe that we have been utilizing just due to the fact that with the dressing I don't know that his diabetic shoes are going to work as appropriately as far as not called an additional pressure and irritation to the area in question. He understands. 10/10/17 on evaluation today patient actually appears to be doing very well in regard to his plantar foot ulcer. He has been tolerating the dressing changes without complication. I do feel like he's making signs of good improvement and in fact of the wound bed appears to be better although it may not be significantly changed in size it appears healthier and I do believe is showing signs of improving. Nonetheless we gonna keep working towards healing as far as that is concerned. There is no evidence of infection. 10/17/17 on evaluation today  patient actually appears to be doing poorly in regard to his plantar foot ulcer. Unfortunately other than just the area where the wound is itself there appears to be a blister that communicates with the wound unfortunately. This is more lateral to the wound itself and also to the distal point of the amputation site. There does not appear to be any evidence of cellulitis spreading at the foot but I do  believe some of the drainage from the site itself is. When in nature. Nonetheless this blister area I think needs to be removed in order to allow for appropriate healing of the ulcer itself I think that is gonna be difficult for it to heal otherwise. This was discussed with the patient today. 10/24/17 on evaluation today patient actually appears to be doing better compared to last week even post debridement. He has shown signs of improvement he did test positive for Staphylococcus aureus. With that being said he notes that he's not have any discomfort and in general he does feel like things seem to been doing better. Fortunately there's no additional blistering and no evidence of remaining infection at this time. No fevers, chills, nausea, or vomiting noted at this time. He has three days of antibiotic left. 10/31/17 on evaluation today patient actually appears to be doing very well in regard to his ulcer on the left foot. He has been tolerating the dressing changes without complication. With that being said fortunately there appears to be no evidence of infection I do think he's made progress compared to previous. No fevers, chills, nausea, or vomiting noted at this time. 11/14/17 on evaluation today patient actually appears to be doing excellent in regard to his amputation site ulcer on his foot. In fact this appears to be completely healed at this point. There is no evidence of infection nor underline abscess and I did thoroughly evaluate the periwound location. Overall I'm very pleased with how things  appear. Readmission: 01-03-2022 upon evaluation today patient appears for reevaluation here in the clinic although it has been since 2019 that I last saw him. This again has been over 4 years. With that being said he is having an issue with a wound on his foot unfortunately. He has not had any recent x-rays he tells me at this point which is unfortunate as avoid definitely can need to get something started that can be one of the primary items to get moving forward with. With that being said I also think were probably can obtain a wound culture he is probably going to require some antibiotics at some point. I just want to make sure that we have him on the right thing when we do this. Currently the patient tells me that his wounds have been present in regard to the met head for about a month at this point. With that being said he also has a wound on the third toe which is the next toe this actually still present. This unfortunately has a lot of callus buildup around it but I think it is larger underneath than what it would appear on initial inspection. Patient does have a history of diabetes mellitus type 2, congestive heart failure, and COPD. 01-11-2022 upon evaluation patient's wounds actually are showing signs of doing about the same may be just slightly cleaner but definitely not where we want things to be as of yet. Fortunately there does not appear to be any signs of active infection locally nor systemically at this point which is great news. No Raymond [name], Raymond [name] ([id number]) [id number] Page 4 of 11 fevers, chills, nausea, vomiting, or diarrhea. Of note patient has had his MRI but we do not have the results of that as of yet we are still waiting for the reading on this at this point. 01-18-2022 upon evaluation today patient presents after having had his MRI finally complete. It did show that he has evidence of osteomyelitis of the first metatarsal  head, third  digit, and fourth digit. The third and fourth digits seem to be early which is good news. Nonetheless we are still going to need to try to see about getting the infection under control he is already been on the antibiotic therapy which I think has done quite well. In fact I feel like he is showing signs of improvement already. 12/28; patient with a wound on the medial aspect of the left first metatarsal head and an area on the left third toe with slight hammer deformity. He has had a previous left first toe amputation. We are using Iodoflex on the wound. He is a diabetic a recent MRI showed osteomyelitis we have been giving him Cipro and Doxy which he appears to be tolerating well. 02-10-2022 upon evaluation today patient unfortunately has issues still with open wounds of his foot on the left. He does have known osteomyelitis at this point and that is something that we have been treating with oral antibiotics. I actually have not seen him personally since 19 December. In that time he has not had any debridement from that point until today based on what I can see as best I can tell anyway. Has been on Cipro as well as doxycycline. Nonetheless he does still seem to have open wounds today and I think that he would benefit from hyperbaric oxygen therapy. I discussed that with him today as well. 02-18-2022 upon evaluation today patient appears to be doing about the same in regard to his wounds. Fortunately I do not see any signs of infection although he still has areas that seem to initially close down but that he has a lot of callus that still open at both locations most on the metatarsal region as well as the toe. I think that he is appropriate for proceeding with the hyperbarics and I think the sooner we get this done to try to get these wounds healed the better off he will be. 02-25-2022 upon evaluation today patient's wounds do show signs of being dry get again. We were attempting to pack and some of the  alginate dressing instead of doing the collagen as everything was getting very dry but nonetheless this is still continue to be an ongoing issue. My suggestion based on what I am seeing is good to be that we actually switch to doing Xeroform which should hopefully keep this a little bit more moist and from hopefully closing up prematurely. The patient voiced understanding. Electronic Signature(s) Signed: 02/25/2022 1:42:07 PM By: [name] PA-C Entered By: [name] on 02/25/2022 13:42:07 -------------------------------------------------------------------------------- Physical Exam Details Patient Name: Date of Service: Raymond Eke D. 02/25/2022 9:15 A M Medical Record Number: [id number] Patient Account Number: [id number] Date of Birth/Sex: Treating RN: [birth year] (57 y.o. [name] Primary Care Provider: [name] Other Clinician: Referring Provider: Treating Provider/Extender: [name] Weeks in Treatment: 7 Constitutional Well-nourished and well-hydrated in no acute distress. Respiratory normal breathing without difficulty. Psychiatric this patient is able to make decisions and demonstrates good insight into disease process. Alert and Oriented x 3. pleasant and cooperative. Notes Upon inspection patient's wound bed actually showed signs of good granulation epithelization at this point. Fortunately I see no signs of active infection which is great news and overall I do believe that we are on the right track here. #1 I am going to suggest Electronic Signature(s) Signed: 02/25/2022 1:42:57 PM By: [name] PA-C Entered By: [name] on 02/25/2022 13:42:57  ADIR, SCHICKER ([id number]) [id number] Page 5 of 11 -------------------------------------------------------------------------------- Physician Orders Details Patient Name: Date of Service: Raymond [name] 02/25/2022 9:15 A  M Medical Record Number: [id number] Patient Account Number: [id number] Date of Birth/Sex: Treating RN: 1965/11/11 (57 y.o. [name] Primary Care Provider: [name] Other Clinician: Referring Provider: Treating Provider/Extender: Hazle Coca in Treatment: 7 Verbal / Phone Orders: No Diagnosis Coding Follow-up Appointments Wound #2 Left,Medial Metatarsal head first Return Appointment in 1 week. Wound #3 Left T Third oe Return Appointment in 1 week. Bathing/ [employer] wounds with antibacterial soap and water. May shower; gently cleanse wound with antibacterial soap, rinse and pat dry prior to dressing wounds Off-Loading Open toe surgical shoe Hyperbaric Oxygen Therapy Wound #2 Left,Medial Metatarsal head first Evaluate for HBO Therapy Indication and location: - [place]agoner Grade 3 ulcers of the left foot and 3rd toe If appropriate for treatment, begin HBOT per protocol: 2.0 ATA for 90 Minutes without A Breaks ir One treatment per day (delivered Monday through Friday unless otherwise specified in Special Instructions below): Total # of Treatments: - 40 A ntihistamine 30 minutes prior to HBO Treatment, difficulty clearing ears. Finger stick Blood Glucose Pre- and Post- HBOT Treatment. Follow Hyperbaric Oxygen Glycemia Protocol Wound #3 Left T Third oe Evaluate for HBO Therapy Indication and location: - [place] Grade 3 ulcers of the left foot and 3rd toe If appropriate for treatment, begin HBOT per protocol: 2.0 ATA for 90 Minutes without A Breaks ir One treatment per day (delivered Monday through Friday unless otherwise specified in Special Instructions below): Total # of Treatments: - 40 A ntihistamine 30 minutes prior to HBO Treatment, difficulty clearing ears. Finger stick Blood Glucose Pre- and Post- HBOT Treatment. Follow Hyperbaric Oxygen Glycemia Protocol Wound Treatment Wound #2 - Metatarsal head first Wound Laterality: Left,  Medial Cleanser: Byram Ancillary Kit - 15 Day Supply (Generic) 3 x Per Week/30 Days Discharge Instructions: Use supplies as instructed; Kit contains: (15) Saline Bullets; (15) 3x3 Gauze; 15 pr Gloves Cleanser: Soap and Water 3 x Per Week/30 Days Discharge Instructions: Gently cleanse wound with antibacterial soap, rinse and pat dry prior to dressing wounds Prim Dressing: Xeroform 4x4-HBD (in/in) 3 x Per Week/30 Days ary Discharge Instructions: Apply Xeroform 4x4-HBD (in/in) as directed Secondary Dressing: Kerlix 4.5 x 4.1 (in/yd) 3 x Per Week/30 Days Discharge Instructions: Apply Kerlix 4.5 x 4.1 (in/yd) as instructed Secured With: Medipore T - 62M Medipore H Soft Cloth Surgical T ape ape, 2x2 (in/yd) (Generic) 3 x Per Week/30 Days Wound #3 - T Third oe Wound Laterality: Left Cleanser: Byram Ancillary Kit - 15 Day Supply (Generic) 3 x Per Week/30 Days Raymond [name], Raymond [name] ([id number]) [id number] Page 6 of 11 Discharge Instructions: Use supplies as instructed; Kit contains: (15) Saline Bullets; (15) 3x3 Gauze; 15 pr Gloves Cleanser: Soap and Water 3 x Per Week/30 Days Discharge Instructions: Gently cleanse wound with antibacterial soap, rinse and pat dry prior to dressing wounds Prim Dressing: Xeroform 4x4-HBD (in/in) 3 x Per Week/30 Days ary Discharge Instructions: Apply Xeroform 4x4-HBD (in/in) as directed Secondary Dressing: Kerlix 4.5 x 4.1 (in/yd) 3 x Per Week/30 Days Discharge Instructions: Apply Kerlix 4.5 x 4.1 (in/yd) as instructed Secured With: Medipore T - 62M Medipore H Soft Cloth Surgical T ape ape, 2x2 (in/yd) (Generic) 3 x Per Week/30 Days GLYCEMIA INTERVENTIONS PROTOCOL PRE-HBO GLYCEMIA INTERVENTIONS ACTION INTERVENTION Obtain pre-HBO capillary blood glucose (ensure 1 physician order is in chart). A. Notify HBO physician  and await physician orders. 2 If result is 70 mg/dl or below: B. If the result meets the hospital definition of  a critical result, follow hospital policy. A. Give patient an 8 ounce Glucerna Shake, an 8 ounce Ensure, or 8 ounces of a Glucerna/Ensure equivalent dietary supplement*. B. Wait 30 minutes. If result is 71 mg/dl to 130 mg/dl: C. Retest patients capillary blood glucose (CBG). D. If result greater than or equal to 110 mg/dl, proceed with HBO. If result less than 110 mg/dl, notify HBO physician and consider holding HBO. If result is 131 mg/dl to 249 mg/dl: A. Proceed with HBO. A. Notify HBO physician and await physician orders. B. It is recommended to hold HBO and do If result is 250 mg/dl or greater: blood/urine ketone testing. C. If the result meets the hospital definition of a critical result, follow hospital policy. POST-HBO GLYCEMIA INTERVENTIONS ACTION INTERVENTION Obtain post HBO capillary blood glucose (ensure 1 physician order is in chart). A. Notify HBO physician and await physician orders. 2 If result is 70 mg/dl or below: B. If the result meets the hospital definition of a critical result, follow hospital policy. A. Give patient an 8 ounce Glucerna Shake, an 8 ounce Ensure, or 8 ounces of a Glucerna/Ensure equivalent dietary supplement*. B. Wait 15 minutes for symptoms of If result is 71 mg/dl to 100 mg/dl: hypoglycemia (i.e. nervousness, anxiety, sweating, chills, clamminess, irritability, confusion, tachycardia or dizziness). C. If patient asymptomatic, discharge patient. If patient symptomatic, repeat capillary blood glucose (CBG) and notify HBO physician. If result is 101 mg/dl to 249 mg/dl: A. Discharge patient. A. Notify HBO physician and await physician orders. B. It is recommended to do blood/urine ketone If result is 250 mg/dl or greater: testing. C. If the result meets the hospital definition of a critical result, follow hospital policy. *Juice or candies are NOT equivalent products. If patient refuses the Glucerna or Ensure, please consult  the hospital dietitian for an appropriate substitute. Electronic Signature(s) Signed: 02/25/2022 12:44:53 PM By: Rosalio Loud MSN RN CNS WTA Signed: 02/25/2022 2:17:33 PM By: [name] PA-C Entered By: Rosalio Loud on 02/25/2022 09:51:04 Raymond [name] ([id number]) [id number] Page 7 of 11 -------------------------------------------------------------------------------- Problem List Details Patient Name: Date of Service: Raymond [name] 02/25/2022 9:15 A M Medical Record Number: [id number] Patient Account Number: [id number] Date of Birth/Sex: Treating RN: [birth year] (57 y.o. [name] Primary Care Provider: [name] Other Clinician: Referring Provider: Treating Provider/Extender: [name] Weeks in Treatment: 7 Active Problems ICD-10 Encounter Code Description Active Date MDM Diagnosis [phone] Chronic multifocal osteomyelitis, left ankle and foot 02/10/2022 No Yes E11.621 Type 2 diabetes mellitus with foot ulcer 01/03/2022 No Yes L97.522 Non-pressure chronic ulcer of other part of left foot with fat layer exposed 01/03/2022 No Yes I50.42 Chronic combined systolic (congestive) and diastolic (congestive) heart failure 01/03/2022 No Yes J44.9 Chronic obstructive pulmonary disease, unspecified 01/03/2022 No Yes Inactive Problems Resolved Problems Electronic Signature(s) Signed: 02/25/2022 9:44:12 AM By: [name] PA-C Entered By: [name] on 02/25/2022 09:44:12 -------------------------------------------------------------------------------- Progress Note Details Patient Name: Date of Service: Raymond Eke D. 02/25/2022 9:15 A M Medical Record Number: [id number] Patient Account Number: [id number] Date of Birth/Sex: Treating RN: 01/07/1966 (58 y.o. [name] Primary Care Provider: [name] Other Clinician: Wendelyn [name] ([id number]) [id number]  Page 8 of 11 Referring Provider: Treating Provider/Extender: Hazle Coca in Treatment: 7 Subjective Chief Complaint Information obtained from Patient Left foot  ulcer with chronic osteomyelitis History of Present Illness (HPI) 09/05/17-He is seeing an initial evaluation for a left plantar foot ulcer. He has a remote history of left great toe amputation. He states that 4-6 weeks ago he noted callus formation and ulceration. He has not seen primary care regarding this. He is not currently on antibiotic therapy. He does not routinely follow with podiatry. He states diabetic foot wear will arrive early next week. The EHR shows an A1c of 9% approximate 4 months ago but he states he had one and primary care a few weeks ago but does not know the results. He is neuropathic and does not complain of any pain, he is currently wearing crocs. 09/12/17-he is here in follow up evaluation for left plantar foot ulcer. There is improvement in both appearance and measurement. We will continue with same treatment plan and he will follow up next week. 09/19/17 on evaluation today patient actually appears to be doing excellent in regard to his ulcer on the invitation site of his left great foot plantar aspect. He's been tolerating the dressing changes without complication. In fact with the Prisma and the current measures he has been shown signs of excellent improvement week by week up to this point. We have been to breeding the wound in this seems to have been of great benefit for him. Fortunately there is no evidence of infection. 09/26/17 on evaluation today patient appears to be doing rather well in regard to his wound. He did not know quite as much improvement this week as compared to last week. Nonetheless he still continues to show signs of improving to some degree. I do believe he may benefit from an offloading shoe. No fevers, chills, nausea, or vomiting noted at this time. 10/03/17 on  evaluation today patient actually appears to be doing much better in regard to the amputation site plantar foot ulcer. Overall this appears significantly smaller even compared to previous. He's been tolerating the dressing changes without complication. He did get his diabetic shoes and I did have a look at them they appear to be fairly good. Nonetheless I do think that for the time being I would probably recommend he continue with the offloading shoe that we have been utilizing just due to the fact that with the dressing I don't know that his diabetic shoes are going to work as appropriately as far as not called an additional pressure and irritation to the area in question. He understands. 10/10/17 on evaluation today patient actually appears to be doing very well in regard to his plantar foot ulcer. He has been tolerating the dressing changes without complication. I do feel like he's making signs of good improvement and in fact of the wound bed appears to be better although it may not be significantly changed in size it appears healthier and I do believe is showing signs of improving. Nonetheless we gonna keep working towards healing as far as that is concerned. There is no evidence of infection. 10/17/17 on evaluation today patient actually appears to be doing poorly in regard to his plantar foot ulcer. Unfortunately other than just the area where the wound is itself there appears to be a blister that communicates with the wound unfortunately. This is more lateral to the wound itself and also to the distal point of the amputation site. There does not appear to be any evidence of cellulitis spreading at the foot but I do believe some of the drainage from the site itself is. When in  nature. Nonetheless this blister area I think needs to be removed in order to allow for appropriate healing of the ulcer itself I think that is gonna be difficult for it to heal otherwise. This was discussed with the patient  today. 10/24/17 on evaluation today patient actually appears to be doing better compared to last week even post debridement. He has shown signs of improvement he did test positive for Staphylococcus aureus. With that being said he notes that he's not have any discomfort and in general he does feel like things seem to been doing better. Fortunately there's no additional blistering and no evidence of remaining infection at this time. No fevers, chills, nausea, or vomiting noted at this time. He has three days of antibiotic left. 10/31/17 on evaluation today patient actually appears to be doing very well in regard to his ulcer on the left foot. He has been tolerating the dressing changes without complication. With that being said fortunately there appears to be no evidence of infection I do think he's made progress compared to previous. No fevers, chills, nausea, or vomiting noted at this time. 11/14/17 on evaluation today patient actually appears to be doing excellent in regard to his amputation site ulcer on his foot. In fact this appears to be completely healed at this point. There is no evidence of infection nor underline abscess and I did thoroughly evaluate the periwound location. Overall I'm very pleased with how things appear. Readmission: 01-03-2022 upon evaluation today patient appears for reevaluation here in the clinic although it has been since 2019 that I last saw him. This again has been over 4 years. With that being said he is having an issue with a wound on his foot unfortunately. He has not had any recent x-rays he tells me at this point which is unfortunate as avoid definitely can need to get something started that can be one of the primary items to get moving forward with. With that being said I also think were probably can obtain a wound culture he is probably going to require some antibiotics at some point. I just want to make sure that we have him on the right thing when we do this.  Currently the patient tells me that his wounds have been present in regard to the met head for about a month at this point. With that being said he also has a wound on the third toe which is the next toe this actually still present. This unfortunately has a lot of callus buildup around it but I think it is larger underneath than what it would appear on initial inspection. Patient does have a history of diabetes mellitus type 2, congestive heart failure, and COPD. 01-11-2022 upon evaluation patient's wounds actually are showing signs of doing about the same may be just slightly cleaner but definitely not where we want things to be as of yet. Fortunately there does not appear to be any signs of active infection locally nor systemically at this point which is great news. No fevers, chills, nausea, vomiting, or diarrhea. Of note patient has had his MRI but we do not have the results of that as of yet we are still waiting for the reading on this at this point. 01-18-2022 upon evaluation today patient presents after having had his MRI finally complete. 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 The third and fourth digits seem to be early which is good news. Nonetheless  we are still going to need to try to see about getting the infection under control he is already been on the antibiotic therapy which I think has done quite well. In fact I feel like he is showing signs of improvement already. 12/28; patient with a wound on the medial aspect of the left first metatarsal head and an area on the left third toe with slight hammer deformity. He has had a previous left first toe amputation. We are using Iodoflex on the wound. He is a diabetic a recent MRI showed osteomyelitis we have been giving him Cipro and Doxy which he appears to be tolerating well. 02-10-2022 upon evaluation today patient unfortunately has issues still with open wounds of his  foot on the left. He does have known osteomyelitis at this point and that is something that we have been treating with oral antibiotics. I actually have not seen him personally since 19 December. In that time he has not had any debridement from that point until today based on what I can see as best I can tell anyway. Has been on Cipro as well as doxycycline. Nonetheless he does still seem to have open wounds today and I think that he would benefit from hyperbaric oxygen therapy. I discussed that with him today as well. 02-18-2022 upon evaluation today patient appears to be doing about the same in regard to his wounds. Fortunately I do not see any signs of infection although Raymond [name], Raymond [name] ([id number]) [id number] Page 9 of 11 he still has areas that seem to initially close down but that he has a lot of callus that still open at both locations most on the metatarsal region as well as the toe. I think that he is appropriate for proceeding with the hyperbarics and I think the sooner we get this done to try to get these wounds healed the better off he will be. 02-25-2022 upon evaluation today patient's wounds do show signs of being dry get again. We were attempting to pack and some of the alginate dressing instead of doing the collagen as everything was getting very dry but nonetheless this is still continue to be an ongoing issue. My suggestion based on what I am seeing is good to be that we actually switch to doing Xeroform which should hopefully keep this a little bit more moist and from hopefully closing up prematurely. The patient voiced understanding. Objective Constitutional Well-nourished and well-hydrated in no acute distress. Vitals Time Taken: 9:29 AM, Height: 74 in, Weight: 371 lbs, BMI: 47.6, Temperature: 97.4 F, Pulse: 80 bpm, Respiratory Rate: 18 breaths/min, Blood Pressure: 131/78 mmHg. Respiratory normal breathing without  difficulty. Psychiatric this patient is able to make decisions and demonstrates good insight into disease process. Alert and Oriented x 3. pleasant and cooperative. General Notes: Upon inspection patient's wound bed actually showed signs of good granulation epithelization at this point. Fortunately I see no signs of active infection which is great news and overall I do believe that we are on the right track here. #1 I am going to suggest Integumentary (Hair, Skin) Wound #2 status is Open. Original cause of wound was Gradually Appeared. The date acquired was: 12/13/2021. The wound has been in treatment 7 weeks. The wound is located on the Left,Medial Metatarsal head first. The wound measures 0.5cm length x 0.2cm width x 0.1cm depth; 0.079cm^2 area and 0.008cm^3 volume. There is Fat Layer (Subcutaneous Tissue) exposed. There is a medium amount of serous drainage noted. The wound margin is flat and  intact. There is no granulation within the wound bed. There is a large (67-100%) amount of necrotic tissue within the wound bed including Adherent Slough. Wound #3 status is Open. Original cause of wound was Footwear Injury. The date acquired was: 01/03/2022. The wound has been in treatment 7 weeks. The wound is located on the Left T Third. The wound measures 0.7cm length x 0.7cm width x 0.1cm depth; 0.385cm^2 area and 0.038cm^3 volume. There is Fat oe Layer (Subcutaneous Tissue) exposed. There is a medium amount of serous drainage noted. The wound margin is flat and intact. There is no granulation within the wound bed. There is a large (67-100%) amount of necrotic tissue within the wound bed. 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 There was a Selective/Open Wound Non-Viable Tissue Debridement with a total area of 0.1 sq cm performed by Tommie Sams., PA-C. With the following instrument(s): Curette to remove Viable and Non-Viable tissue/material. Material removed includes Callus. A Minimum amount of bleeding was controlled with Silver Nitrate. The procedure was tolerated well. Post Debridement Measurements: 0.5cm length x 0.3cm width x 0.3cm depth; 0.035cm^3 volume. Character of Wound/Ulcer Post Debridement requires further debridement. Severity of Tissue Post Debridement is: Fat layer exposed. Post procedure Diagnosis Wound #2: Same as Pre-Procedure Wound #3 Pre-procedure diagnosis of Wound #3 is a Diabetic Wound/Ulcer of the Lower Extremity located on the Left T Third .Severity of Tissue Pre Debridement is: oe Fat layer exposed. There was a Selective/Open Wound Non-Viable Tissue Debridement with a total area of 0.49 sq cm performed by Tommie Sams., PA-C. With the following instrument(s): Curette to remove Viable and Non-Viable tissue/material. Material removed includes Callus. A Minimum amount of bleeding was controlled with Pressure. The procedure was tolerated well. Post Debridement Measurements: 0.7cm length x 0.7cm width x 0.2cm depth; 0.077cm^3 volume. Character of Wound/Ulcer Post Debridement requires further debridement. Severity of Tissue Post Debridement is: Fat layer exposed. Post procedure Diagnosis Wound #3: Same as Pre-Procedure Raymond [name], Raymond [name] ([id number]) [id number] Page 10 of 11 Plan Follow-up Appointments: Wound #2 Left,Medial Metatarsal head first: Return Appointment in 1 week. Wound #3 Left T Third: oe Return Appointment in 1  week. Bathing/ Shower/ Hygiene: Wash wounds with antibacterial soap and water. May shower; gently cleanse wound with antibacterial soap, rinse and pat dry prior to dressing wounds Off-Loading: Open toe surgical shoe Hyperbaric Oxygen Therapy: Wound #2 Left,Medial Metatarsal head first: Evaluate for HBO Therapy Indication and location: - [place] Grade 3 ulcers of the left foot and 3rd toe If appropriate for treatment, begin HBOT per protocol: 2.0 ATA for 90 Minutes without Air Breaks One treatment per day (delivered Monday through Friday unless otherwise specified in Special Instructions below): T # of Treatments: - 40 otal Antihistamine 30 minutes prior to HBO Treatment, difficulty clearing ears. Finger stick Blood Glucose Pre- and Post- HBOT Treatment. Follow Hyperbaric Oxygen Glycemia Protocol Wound #3 Left T Third: oe Evaluate for HBO Therapy Indication and location: - [place] Grade 3 ulcers of the left foot and 3rd toe If appropriate for treatment, begin HBOT per protocol: 2.0 ATA for  90 Minutes without Air Breaks One treatment per day (delivered Monday through Friday unless otherwise specified in Special Instructions below): T # of Treatments: - 40 otal Antihistamine 30 minutes prior to HBO Treatment, difficulty clearing ears. Finger stick Blood Glucose Pre- and Post- HBOT Treatment. Follow Hyperbaric Oxygen Glycemia Protocol WOUND #2: - Metatarsal head first Wound Laterality: Left, Medial Cleanser: Byram Ancillary Kit - 15 Day Supply (Generic) 3 x Per Week/30 Days Discharge Instructions: Use supplies as instructed; Kit contains: (15) Saline Bullets; (15) 3x3 Gauze; 15 pr Gloves Cleanser: Soap and Water 3 x Per Week/30 Days Discharge Instructions: Gently cleanse wound with antibacterial soap, rinse and pat dry prior to dressing wounds Prim Dressing: Xeroform 4x4-HBD (in/in) 3 x Per Week/30 Days ary Discharge Instructions: Apply Xeroform 4x4-HBD (in/in) as directed Secondary  Dressing: Kerlix 4.5 x 4.1 (in/yd) 3 x Per Week/30 Days Discharge Instructions: Apply Kerlix 4.5 x 4.1 (in/yd) as instructed Secured With: Medipore T - 36M Medipore H Soft Cloth Surgical T ape ape, 2x2 (in/yd) (Generic) 3 x Per Week/30 Days WOUND #3: - T Third Wound Laterality: Left oe Cleanser: Byram Ancillary Kit - 15 Day Supply (Generic) 3 x Per Week/30 Days Discharge Instructions: Use supplies as instructed; Kit contains: (15) Saline Bullets; (15) 3x3 Gauze; 15 pr Gloves Cleanser: Soap and Water 3 x Per Week/30 Days Discharge Instructions: Gently cleanse wound with antibacterial soap, rinse and pat dry prior to dressing wounds Prim Dressing: Xeroform 4x4-HBD (in/in) 3 x Per Week/30 Days ary Discharge Instructions: Apply Xeroform 4x4-HBD (in/in) as directed Secondary Dressing: Kerlix 4.5 x 4.1 (in/yd) 3 x Per Week/30 Days Discharge Instructions: Apply Kerlix 4.5 x 4.1 (in/yd) as instructed Secured With: Medipore T - 36M Medipore H Soft Cloth Surgical T ape ape, 2x2 (in/yd) (Generic) 3 x Per Week/30 Days 1. I am going to suggest that we have the patient go ahead and switch to a Xeroform gauze dressing to the wounds try to keep this from drying out so much. He voiced understanding that he is in agreement with that plan. 2. I am good also recommend that he continue currently with the antibiotic regimen. Currently I have him on doxycycline and I think that something that he should still continue with as well. He is also on Cipro along with this. 3. I am also can recommend that we continue with hyperbaric oxygen therapy. Obviously the goal is to try and get this healed as quickly as possible. He does have osteomyelitis and overall I think that getting the wounds closed his primary goal secondary goal is making sure the osteomyelitis does not return. We will see patient back for reevaluation in 1 week here in the clinic. If anything worsens or changes patient will contact our office for  additional recommendations. Electronic Signature(s) Signed: 02/25/2022 1:43:46 PM By: [name] PA-C Entered By: [name] on 02/25/2022 13:43:46 Raymond [name] ([id number]) [id number] Page 11 of 11 -------------------------------------------------------------------------------- SuperBill Details Patient Name: Date of Service: Raymond [name] 02/25/2022 Medical Record Number: [id number] Patient Account Number: [id number] Date of Birth/Sex: Treating RN: Dec 15, 1965 (57 y.o. [name] Primary Care Provider: [name] Other Clinician: Referring Provider: Treating Provider/Extender: [name] Weeks in Treatment: 7 Diagnosis Coding ICD-10 Codes Code Description [phone] Chronic multifocal osteomyelitis, left ankle and foot E11.621 Type 2 diabetes mellitus with foot ulcer L97.522 Non-pressure chronic ulcer of other part of left foot with fat layer exposed I50.42 Chronic combined systolic (congestive) and diastolic (congestive) heart failure  J44.9 Chronic obstructive pulmonary disease, unspecified Facility Procedures : CPT4 Code: [id number] Description: [phone] - DEBRIDE WOUND 1ST 20 SQ CM OR < ICD-10 Diagnosis Description L97.522 Non-pressure chronic ulcer of other part of left foot with fat layer exposed Modifier: Quantity: 1 Physician Procedures : CPT4 Code Description Modifier [id number] - WC PHYS DEBR WO ANESTH 20 SQ CM ICD-10 Diagnosis Description L97.522 Non-pressure chronic ulcer of other part of left foot with fat layer exposed Quantity: 1 Electronic Signature(s) Signed: 02/25/2022 1:44:05 PM By: [name] PA-C Entered By: [name] on 02/25/2022 13:44:05

## 2022-02-25 NOTE — Progress Notes (Signed)
Raymond, [name] ([id number]) [id number] Page 1 of 11 Visit Report for 02/25/2022 Arrival Information Details Patient Name: Date of Service: Raymond [name] 02/25/2022 9:15 A M Medical Record Number: [id number] Patient Account Number: [id number] Date of Birth/Sex: Treating RN: May 17, 1965 (57 y.o. Raymond [name] Primary Care Raymond [name]: Raymond [name] Other Clinician: Referring Reesha Debes: Treating Raymond [name]/Extender: Raymond [name] in Treatment: 7 Visit Information History Since Last Visit Added or deleted any medications: No Patient Arrived: Ambulatory Any new allergies or adverse reactions: No Arrival Time: 09:28 Had a fall or experienced change in No Accompanied By: self activities of daily living that may affect Transfer Assistance: None risk of falls: Patient Identification Verified: Yes Hospitalized since last visit: No Secondary Verification Process Completed: Yes Has Dressing in Place as Prescribed: Yes Patient Requires Transmission-Based Precautions: No Pain Present Now: No Patient Has Alerts: Yes Patient Alerts: Type II Diabetic Aspirin '81mg'$  ABI L [place]>220 Electronic Signature(s) Signed: 02/25/2022 12:44:53 PM By: Raymond Loud MSN RN CNS WTA Entered By: Raymond [name] on 02/25/2022 09:45:58 -------------------------------------------------------------------------------- Clinic Level of Care Assessment Details Patient Name: Date of Service: Raymond [name]. 02/25/2022 9:15 A M Medical Record Number: [id number] Patient Account Number: [id number] Date of Birth/Sex: Treating RN: [birth year] (57 y.o. Raymond [name] Primary Care Loreta Blouch: Raymond [name] Other Clinician: Referring Roslin Norwood: Treating Jeanine Caven/Extender: Raymond [name] in Treatment: 7 Clinic Level of Care Assessment Items TOOL 1 Quantity Score '[]'$  - 0 Use when EandM and Procedure is performed on INITIAL visit ASSESSMENTS -  Nursing Assessment / Reassessment '[]'$  - 0 General Physical Exam (combine w/ comprehensive assessment (listed just below) when performed on new pt. evals) '[]'$  - 0 Comprehensive Assessment (HX, ROS, Risk Assessments, Wounds Hx, etc.) ASSESSMENTS - Wound and Skin Assessment / Reassessment '[]'$  - 0 Dermatologic / Skin Assessment (not related to wound area) Raymond [name], Raymond [name] ([id number]) [id number] Page 2 of 11 ASSESSMENTS - Ostomy and/or Continence Assessment and Care '[]'$  - 0 Incontinence Assessment and Management '[]'$  - 0 Ostomy Care Assessment and Management (repouching, etc.) PROCESS - Coordination of Care '[]'$  - 0 Simple Patient / Family Education for ongoing care '[]'$  - 0 Complex (extensive) Patient / Family Education for ongoing care '[]'$  - 0 Staff obtains Programmer, systems, Records, T Results / Process Orders est '[]'$  - 0 Staff telephones HHA, Nursing Homes / Clarify orders / etc '[]'$  - 0 Routine Transfer to another Facility (non-emergent condition) '[]'$  - 0 Routine Hospital Admission (non-emergent condition) '[]'$  - 0 New Admissions / Biomedical engineer / Ordering NPWT Apligraf, etc. , '[]'$  - 0 Emergency Hospital Admission (emergent condition) PROCESS - Special Needs '[]'$  - 0 Pediatric / Minor Patient Management '[]'$  - 0 Isolation Patient Management '[]'$  - 0 Hearing / Language / Visual special needs '[]'$  - 0 Assessment of Community assistance (transportation, [name]/C planning, etc.) '[]'$  - 0 Additional assistance / Altered mentation '[]'$  - 0 Support Surface(s) Assessment (bed, cushion, seat, etc.) INTERVENTIONS - Miscellaneous '[]'$  - 0 External ear exam '[]'$  - 0 Patient Transfer (multiple staff / Civil Service fast streamer / Similar devices) '[]'$  - 0 Simple Staple / Suture removal (25 or less) '[]'$  - 0 Complex Staple / Suture removal (26 or more) '[]'$  - 0 Hypo/Hyperglycemic Management (do not check if billed separately) '[]'$  - 0 Ankle / Brachial Index (ABI) - do not check if billed  separately Has the patient been seen at the hospital within the last three years: Yes Total Score: 0 Level Of Care: ____ Electronic Signature(s)  Signed: 02/25/2022 12:44:53 PM By: Raymond Loud MSN RN CNS WTA Entered By: Raymond [name] on 02/25/2022 09:51:58 -------------------------------------------------------------------------------- Encounter Discharge Information Details Patient Name: Date of Service: Raymond [name]. 02/25/2022 9:15 A M Medical Record Number: [id number] Patient Account Number: [id number] Date of Birth/Sex: Treating RN: 01-12-1966 (57 y.o. Raymond [name] Primary Care Hibba Schram: Raymond [name] Other Clinician: Referring Easter Kennebrew: Treating Olyver Hawes/Extender: Raymond [name] in Treatment: 7 Encounter Discharge Information Items Post Procedure Vitals [name], [name] ([id number]) [id number] Page 3 of 11 Discharge Condition: Stable Temperature (F): 97.4 Ambulatory Status: Ambulatory Pulse (bpm): 80 Discharge Destination: Home Respiratory Rate (breaths/min): 18 Transportation: Private Auto Blood Pressure (mmHg): 131/78 Accompanied By: self Schedule Follow-up Appointment: Yes Clinical Summary of Care: Electronic Signature(s) Signed: 02/25/2022 12:44:53 PM By: Raymond Loud MSN RN CNS WTA Entered By: Raymond [name] on 02/25/2022 10:07:56 -------------------------------------------------------------------------------- Lower Extremity Assessment Details Patient Name: Date of Service: Raymond [name]. 02/25/2022 9:15 A M Medical Record Number: [id number] Patient Account Number: [id number] Date of Birth/Sex: Treating RN: 08/30/1965 (57 y.o. Raymond [name] Primary Care Mamye Bolds: Raymond [name] Other Clinician: Referring Bode Pieper: Treating Haygen Zebrowski/Extender: [name] Weeks in Treatment: 7 Electronic Signature(s) Signed: 02/25/2022 12:44:53 PM By: Raymond Loud MSN RN CNS WTA Entered By:  Raymond [name] on 02/25/2022 09:46:15 -------------------------------------------------------------------------------- Multi Wound Chart Details Patient Name: Date of Service: Raymond [name]. 02/25/2022 9:15 A M Medical Record Number: [id number] Patient Account Number: [id number] Date of Birth/Sex: Treating RN: 1965-05-01 (57 y.o. Raymond [name] Primary Care Aidee Latimore: Raymond [name] Other Clinician: Referring Nichollas Perusse: Treating Analuisa Tudor/Extender: [name] Weeks in Treatment: 7 Vital Signs Height(in): 74 Pulse(bpm): 80 Weight(lbs): 951 Blood Pressure(mmHg): 131/78 Body Mass Index(BMI): 47.6 Temperature(F): 97.4 Respiratory Rate(breaths/min): 18 [2:Photos:] [N/A:N/A [id number] Page 4 of 11] Left, Medial Metatarsal head first Left T Third oe N/A Wound Location: Gradually Appeared Footwear Injury N/A Wounding Event: Diabetic Wound/Ulcer of the Lower Diabetic Wound/Ulcer of the Lower N/A Primary Etiology: Extremity Extremity Congestive Heart Failure, Congestive Heart Failure, N/A Comorbid History: Hypertension, Type II Diabetes, Hypertension, Type II Diabetes, Neuropathy Neuropathy 12/13/2021 01/03/2022 N/A Date Acquired: 7 7 N/A Weeks of Treatment: Open Open N/A Wound Status: No No N/A Wound Recurrence: 0.5x0.2x0.1 0.7x0.7x0.1 N/A Measurements L x W x [name] (cm) 0.079 0.385 N/A A (cm) : rea 0.008 0.038 N/A Volume (cm) : 93.80% 39.50% N/A % Reduction in A rea: 97.90% 70.10% N/A % Reduction in Volume: Grade 3 Grade 3 N/A Classification: Medium Medium N/A Exudate A mount: Serous Serous N/A Exudate Type: amber amber N/A Exudate Color: Flat and Intact Flat and Intact N/A Wound Margin: None Present (0%) None Present (0%) N/A Granulation A mount: Large (67-100%) Large (67-100%) N/A Necrotic A mount: Fat Layer (Subcutaneous Tissue): Yes Fat Layer (Subcutaneous Tissue): Yes N/A Exposed Structures: Fascia:  No Fascia: No Tendon: No Tendon: No Muscle: No Muscle: No Joint: No Joint: No Bone: No Bone: No None None N/A Epithelialization: Treatment Notes Wound #2 (Metatarsal head first) Wound Laterality: Left, Medial Cleanser [place] Ancillary Kit - 15 Day Supply Discharge Instruction: Use supplies as instructed; Kit contains: (15) Saline Bullets; (15) 3x3 Gauze; 15 pr Gloves Soap and Water Discharge Instruction: Gently cleanse wound with antibacterial soap, rinse and pat dry prior to dressing wounds Peri-Wound Care Topical Primary Dressing AquacelAg Advantage Dressing, 2X2 (in/in) Discharge Instruction: Apply to wound as directed Secondary Dressing Kerlix 4.5 x 4.1 (in/yd) Discharge Instruction: Apply Kerlix 4.5 x 4.1 (in/yd) as instructed Secured With  Medipore T - 77M Medipore H Soft Cloth Surgical T ape ape, 2x2 (in/yd) Compression Wrap Compression Stockings Add-Ons Wound #3 (Toe Third) Wound Laterality: Left Cleanser [place] Ancillary Kit - 15 Day Supply Discharge Instruction: Use supplies as instructed; Kit contains: (15) Saline Bullets; (15) 3x3 Gauze; 15 pr Gloves Soap and Water Discharge Instruction: Gently cleanse wound with antibacterial soap, rinse and pat dry prior to dressing wounds Peri-Wound Care Topical DREVION, OFFORD ([id number]) [id number] Page 5 of 11 Primary Dressing AquacelAg Advantage Dressing, 2X2 (in/in) Discharge Instruction: Apply to wound as directed Secondary Dressing Kerlix 4.5 x 4.1 (in/yd) Discharge Instruction: Apply Kerlix 4.5 x 4.1 (in/yd) as instructed Secured With [place] H Soft Cloth Surgical T ape ape, 2x2 (in/yd) Compression Wrap Compression Stockings Add-Ons Electronic Signature(s) Signed: 02/25/2022 12:44:53 PM By: Raymond Loud MSN RN CNS WTA Entered By: Raymond [name] on 02/25/2022  09:46:22 -------------------------------------------------------------------------------- Multi-Disciplinary Care Plan Details Patient Name: Date of Service: Raymond [name]. 02/25/2022 9:15 A M Medical Record Number: [id number] Patient Account Number: [id number] Date of Birth/Sex: Treating RN: [birth year] (57 y.o. Raymond [name] Primary Care Trinady Milewski: Raymond [name] Other Clinician: Referring Julienne Vogler: Treating Romell Wolden/Extender: [name] Weeks in Treatment: 7 Active Inactive Medication Nursing Diagnoses: Knowledge deficit related to medication safety: actual or potential Goals: Patient/caregiver will demonstrate understanding of all current medications Date Initiated: 01/03/2022 T arget Resolution Date: 02/18/2022 Goal Status: Active Interventions: Assess for medication contraindications each visit where new medications are prescribed Assess patient/caregiver ability to manage medication regimen upon admission and as needed Patient/Caregiver given reconciled medication list upon admission, changes in medications and discharge from the [place] Provide education on medication safety Notes: Necrotic Tissue Nursing Diagnoses: Impaired tissue integrity related to necrotic/devitalized tissue Knowledge deficit related to management of necrotic/devitalized tissue Goals: Necrotic/devitalized tissue will be minimized in the wound bed Date Initiated: 01/03/2022 Target Resolution Date: 02/18/2022 Goal Status: Active Raymond [name], Raymond [name] ([id number]) [phone].pdf Page 6 of 11 Patient/caregiver will verbalize understanding of reason and process for debridement of necrotic tissue Date Initiated: 01/03/2022 Target Resolution Date: 02/18/2022 Goal Status: Active Interventions: Assess patient pain level pre-, during and post procedure and prior to discharge Provide education on necrotic tissue and debridement process Treatment  Activities: Excisional debridement : 01/03/2022 Notes: Pressure Nursing Diagnoses: Knowledge deficit related to causes and risk factors for pressure ulcer development Knowledge deficit related to management of pressures ulcers Potential for impaired tissue integrity related to pressure, friction, moisture, and shear Goals: Patient will remain free from development of additional pressure ulcers Date Initiated: 01/03/2022 Target Resolution Date: 02/18/2022 Goal Status: Active Patient/caregiver will verbalize risk factors for pressure ulcer development Date Initiated: 01/03/2022 Target Resolution Date: 02/18/2022 Goal Status: Active Patient/caregiver will verbalize understanding of pressure ulcer management Date Initiated: 01/03/2022 Target Resolution Date: 02/18/2022 Goal Status: Active Interventions: Assess potential for pressure ulcer upon admission and as needed Notes: Soft Tissue Infection Nursing Diagnoses: Impaired tissue integrity Knowledge deficit related to disease process and management Knowledge deficit related to home infection control: handwashing, handling of soiled dressings, supply storage Potential for infection: soft tissue Goals: Patient will remain free of wound infection Date Initiated: 01/03/2022 Target Resolution Date: 02/18/2022 Goal Status: Active Patient/caregiver will verbalize understanding of or measures to prevent infection and contamination in the home setting Date Initiated: 01/03/2022 Target Resolution Date: 02/18/2022 Goal Status: Active Patient's soft tissue infection will resolve Date Initiated: 01/03/2022 Target Resolution Date: 02/18/2022 Goal Status: Active Signs and symptoms of infection will be  recognized early to allow for prompt treatment Date Initiated: 01/03/2022 Target Resolution Date: 02/18/2022 Goal Status: Active Interventions: Assess signs and symptoms of infection every visit Treatment Activities: Culture and sensitivity :  01/03/2022 Notes: Wound/Skin Impairment Nursing Diagnoses: Impaired tissue integrity Knowledge deficit related to ulceration/compromised skin integrity Goals: Ulcer/skin breakdown will have a volume reduction of 30% by week 4 Date Initiated: 02/18/2022 Target Resolution Date: 02/18/2022 Goal Status: Active Ulcer/skin breakdown will have a volume reduction of 50% by week 8 Raymond [name], Raymond [name] ([id number]) [id number] Page 7 of 11 Date Initiated: 02/18/2022 Target Resolution Date: 03/18/2022 Goal Status: Active Interventions: Assess patient/caregiver ability to obtain necessary supplies Assess ulceration(s) every visit Treatment Activities: Consult for HBO : 02/18/2022 Referred to DME Chalene Treu for dressing supplies : 02/18/2022 Skin care regimen initiated : 02/18/2022 Notes: Electronic Signature(s) Signed: 02/25/2022 12:44:53 PM By: Raymond Loud MSN RN CNS WTA Entered By: Raymond [name] on 02/25/2022 09:39:52 -------------------------------------------------------------------------------- Pain Assessment Details Patient Name: Date of Service: Raymond [name]. 02/25/2022 9:15 A M Medical Record Number: [id number] Patient Account Number: [id number] Date of Birth/Sex: Treating RN: April 13, 1965 (57 y.o. Raymond [name] Primary Care Talis Iwan: Raymond [name] Other Clinician: Referring Cloris Flippo: Treating Anthon Harpole/Extender: [name] Weeks in Treatment: 7 Active Problems Location of Pain Severity and Description of Pain Patient Has Paino No Site Locations Pain Management and Medication Current Pain Management: Electronic Signature(s) Signed: 02/25/2022 12:44:53 PM By: Raymond Loud MSN RN CNS WTA Entered By: Raymond [name] on 02/25/2022 09:46:06 Raymond [name] ([id number]) [phone].pdf Page 8 of 11 -------------------------------------------------------------------------------- Patient/Caregiver Education  Details Patient Name: Date of Service: Raymond [name] 1/26/2024andnbsp9:15 A M Medical Record Number: [id number] Patient Account Number: [id number] Date of Birth/Gender: Treating RN: [birth year] (57 y.o. Raymond [name] Primary Care Physician: Raymond [name] Other Clinician: Referring Physician: Treating Physician/Extender: Raymond [name] in Treatment: 7 Education Assessment Education Provided To: Patient Education Topics Provided Wound/Skin Impairment: Handouts: Caring for Your Ulcer Methods: Explain/Verbal Responses: State content correctly Electronic Signature(s) Signed: 02/25/2022 12:44:53 PM By: Raymond Loud MSN RN CNS WTA Entered By: Raymond [name] on 02/25/2022 09:39:45 -------------------------------------------------------------------------------- Wound Assessment Details Patient Name: Date of Service: Raymond [name]. 02/25/2022 9:15 A M Medical Record Number: [id number] Patient Account Number: [id number] Date of Birth/Sex: Treating RN: 1965/07/13 (57 y.o. Raymond [name] Primary Care Tandy Lewin: Raymond [name] Other Clinician: Referring Jamiria Langill: Treating Aislin Onofre/Extender: [name] Weeks in Treatment: 7 Wound Status Wound Number: 2 Primary Diabetic Wound/Ulcer of the Lower Extremity Etiology: Wound Location: Left, Medial Metatarsal head first Wound Status: Open Wounding Event: Gradually Appeared Comorbid Congestive Heart Failure, Hypertension, Type II Diabetes, Date Acquired: 12/13/2021 History: Neuropathy Weeks Of Treatment: 7 Clustered Wound: No Photos Raymond [name], Raymond [name] ([id number]) [id number] Page 9 of 11 Wound Measurements Length: (cm) 0.5 Width: (cm) 0.2 Depth: (cm) 0.1 Area: (cm) 0.079 Volume: (cm) 0.008 % Reduction in Area: 93.8% % Reduction in Volume: 97.9% Epithelialization: None Wound Description Classification: Grade 3 Wound Margin: Flat and  Intact Exudate Amount: Medium Exudate Type: Serous Exudate Color: amber Foul Odor After Cleansing: No Slough/Fibrino Yes Wound Bed Granulation Amount: None Present (0%) Exposed Structure Necrotic Amount: Large (67-100%) Fascia Exposed: No Necrotic Quality: Adherent Slough Fat Layer (Subcutaneous Tissue) Exposed: Yes Tendon Exposed: No Muscle Exposed: No Joint Exposed: No Bone Exposed: No Treatment Notes Wound #2 (Metatarsal head first) Wound Laterality: Left, Medial Cleanser Byram Ancillary Kit - 15 Day Supply Discharge Instruction: Use supplies as instructed; Kit contains: (15) Saline  Bullets; (15) 3x3 Gauze; 15 pr Gloves Soap and Water Discharge Instruction: Gently cleanse wound with antibacterial soap, rinse and pat dry prior to dressing wounds Peri-Wound Care Topical Primary Dressing Xeroform 4x4-HBD (in/in) Discharge Instruction: Apply Xeroform 4x4-HBD (in/in) as directed Secondary Dressing Kerlix 4.5 x 4.1 (in/yd) Discharge Instruction: Apply Kerlix 4.5 x 4.1 (in/yd) as instructed Secured With [place] H Soft Cloth Surgical T ape ape, 2x2 (in/yd) Compression Wrap Compression Stockings Add-Ons Electronic Signature(s) Signed: 02/25/2022 12:44:53 PM By: Raymond Loud MSN RN CNS WTA Entered By: Raymond [name] on 02/25/2022 09:38:22 Raymond [name] ([id number]) [id number] Page 10 of 11 -------------------------------------------------------------------------------- Wound Assessment Details Patient Name: Date of Service: Raymond [name] 02/25/2022 9:15 A M Medical Record Number: [id number] Patient Account Number: [id number] Date of Birth/Sex: Treating RN: March 06, 1965 (57 y.o. Raymond [name] Primary Care Jemiah Cuadra: Raymond [name] Other Clinician: Referring Marquavis Hannen: Treating Kynzi Levay/Extender: [name] Weeks in Treatment: 7 Wound Status Wound Number: 3 Primary Diabetic Wound/Ulcer of the Lower  Extremity Etiology: Wound Location: Left T Third oe Wound Status: Open Wounding Event: Footwear Injury Comorbid Congestive Heart Failure, Hypertension, Type II Diabetes, Date Acquired: 01/03/2022 History: Neuropathy Weeks Of Treatment: 7 Clustered Wound: No Photos Wound Measurements Length: (cm) 0.7 Width: (cm) 0.7 Depth: (cm) 0.1 Area: (cm) 0.385 Volume: (cm) 0.038 % Reduction in Area: 39.5% % Reduction in Volume: 70.1% Epithelialization: None Wound Description Classification: Grade 3 Wound Margin: Flat and Intact Exudate Amount: Medium Exudate Type: Serous Exudate Color: amber Foul Odor After Cleansing: No Slough/Fibrino Yes Wound Bed Granulation Amount: None Present (0%) Exposed Structure Necrotic Amount: Large (67-100%) Fascia Exposed: No Fat Layer (Subcutaneous Tissue) Exposed: Yes Tendon Exposed: No Muscle Exposed: No Joint Exposed: No Bone Exposed: No Treatment Notes Wound #3 (Toe Third) Wound Laterality: Left Cleanser Byram Ancillary Kit - 15 Day Supply Discharge Instruction: Use supplies as instructed; Kit contains: (15) Saline Bullets; (15) 3x3 Gauze; 15 pr Gloves Raymond [name] ([id number]) [id number] Page 11 of 11 Soap and Water Discharge Instruction: Gently cleanse wound with antibacterial soap, rinse and pat dry prior to dressing wounds Peri-Wound Care Topical Primary Dressing Xeroform 4x4-HBD (in/in) Discharge Instruction: Apply Xeroform 4x4-HBD (in/in) as directed Secondary Dressing Kerlix 4.5 x 4.1 (in/yd) Discharge Instruction: Apply Kerlix 4.5 x 4.1 (in/yd) as instructed Secured With [place] H Soft Cloth Surgical T ape ape, 2x2 (in/yd) Compression Wrap Compression Stockings Add-Ons Electronic Signature(s) Signed: 02/25/2022 12:44:53 PM By: Raymond Loud MSN RN CNS WTA Entered By: Raymond [name] on 02/25/2022  09:38:48 -------------------------------------------------------------------------------- Vitals Details Patient Name: Date of Service: Raymond [name]. 02/25/2022 9:15 A M Medical Record Number: [id number] Patient Account Number: [id number] Date of Birth/Sex: Treating RN: [birth year] (57 y.o. Raymond [name] Primary Care Ailee Pates: Raymond [name] Other Clinician: Referring Lauris Serviss: Treating Chapman Matteucci/Extender: [name] Weeks in Treatment: 7 Vital Signs Time Taken: 09:29 Temperature (F): 97.4 Height (in): 74 Pulse (bpm): 80 Weight (lbs): 371 Respiratory Rate (breaths/min): 18 Body Mass Index (BMI): 47.6 Blood Pressure (mmHg): 131/78 Reference Range: 80 - 120 mg / dl Electronic Signature(s) Signed: 02/25/2022 12:44:53 PM By: Raymond Loud MSN RN CNS WTA Entered By: Raymond [name] on 02/25/2022 09:46:01

## 2022-02-25 NOTE — Progress Notes (Signed)
MARQUAVION, VENHUIZEN ([id number]) [id number] Page 1 of 1 Visit Report for 03/04/2022 Allergy List Details Patient Name: [name]. Date of Service: 03/04/2022 10:00 A M Medical Record Number: [id number] Patient Account Number: [id number] Date of Birth/Sex: Treating RN: 03-27-1965 (57 y.o. [name] Primary Care Kennede Lusk: [name] Other Clinician: Referring Brailey Buescher: Treating Shandie Bertz/Extender: [name] Weeks in Treatment: 8 Allergies Active Allergies No Known Allergies Allergy Notes Electronic Signature(s) Signed: 02/25/2022 12:21:14 PM By: Gretta Cool, BSN, RN, CWS, Kim RN, BSN Entered By: Gretta Cool, BSN, RN, CWS, Kim on 02/25/2022 12:21:14

## 2022-02-25 NOTE — Progress Notes (Signed)
SHALEV, HELMINIAK ([id number]) [id number] Page 1 of 2 Visit Report for 02/25/2022 Arrival Information Details Patient Name: Date of Service: [name] 02/25/2022 10:00 A M Medical Record Number: [id number] Patient Account Number: [id number] Date of Birth/Sex: Treating RN: 1965/05/04 (57 y.o. M) Primary Care Denitra Donaghey: [name] Other Clinician: Referring Icie Kuznicki: Treating Keiton Cosma/Extender: Kerrin Mo in Treatment: 7 Visit Information History Since Last Visit Added or deleted any medications: No Patient Arrived: Ambulatory Any new allergies or adverse reactions: No Arrival Time: 10:00 Had a fall or experienced change in No Accompanied By: self activities of daily living that may affect Transfer Assistance: None risk of falls: Patient Identification Verified: Yes Signs or symptoms of abuse/neglect since No Secondary Verification Process Completed: Yes last visito Patient Requires Transmission-Based Precautions: No Hospitalized since last visit: No Patient Has Alerts: Yes Implantable device outside of the clinic No Patient Alerts: Type II Diabetic excluding Aspirin '81mg'$  cellular tissue based products placed in the ABI L [place]>220 center since last visit: Has Dressing in Place as Prescribed: Yes Has Footwear/Offloading in Place as Yes Prescribed: Left: Surgical Shoe with Pressure Relief Insole Pain Present Now: No Electronic Signature(s) Signed: 02/25/2022 1:11:50 PM By: [name] RCP, RRT CHT , , Entered By: [name] on 02/25/2022 10:01:42 , -------------------------------------------------------------------------------- Encounter Discharge Information Details Patient Name: Date of Service: [name], [name] 02/25/2022 10:00 A M Medical Record Number: [id number] Patient Account Number: [id number] Date of Birth/Sex: Treating RN: May 17, 1965 (57 y.o. [name], [name] Primary Care  Kerstyn Coryell: [name] Other Clinician: [name] Referring Jerrel Tiberio: Treating Clema Skousen/Extender: Hazle Coca in Treatment: 7 Encounter Discharge Information Items Discharge Condition: Stable Ambulatory Status: Ambulatory Discharge Destination: Home KIP, [facility] ([id number]) [id number] Page 2 of 2 Transportation: Other Accompanied By: transportation Schedule Follow-up Appointment: Yes Clinical Summary of Care: Notes Patient has an HBO treatment scheduled on 02/28/22 '@10'$ :00 am. Electronic Signature(s) Signed: 02/25/2022 1:11:50 PM By: [name] RCP, RRT CHT , , Entered By: [name] on 02/25/2022 13:11:08 , -------------------------------------------------------------------------------- Vitals Details Patient Name: Date of Service: [name], [name] 02/25/2022 10:00 A M Medical Record Number: [id number] Patient Account Number: [id number] Date of Birth/Sex: Treating RN: [birth year] (57 y.o. [name], [name] Primary Care Almir Botts: [name] Other Clinician: [name] Referring Daila Elbert: Treating Elowen Debruyn/Extender: [name] Weeks in Treatment: 7 Vital Signs Time Taken: 10:07 Temperature (F): 97.4 Height (in): 74 Pulse (bpm): 80 Weight (lbs): 371 Respiratory Rate (breaths/min): 18 Body Mass Index (BMI): 47.6 Blood Pressure (mmHg): 131/78 Capillary Blood Glucose (mg/dl): 355 Reference Range: 80 - 120 mg / dl Electronic Signature(s) Signed: 02/25/2022 1:11:50 PM By: [name] RCP, RRT CHT , , Entered By: [name] on 02/25/2022 12:10:43 ,

## 2022-02-28 ENCOUNTER — Encounter: Payer: Medicare HMO | Admitting: Physician Assistant

## 2022-02-28 DIAGNOSIS — E11621 Type 2 diabetes mellitus with foot ulcer: Secondary | ICD-10-CM | POA: Diagnosis not present

## 2022-02-28 LAB — GLUCOSE, CAPILLARY
Glucose-Capillary: 193 mg/dL — ABNORMAL HIGH (ref 70–99)
Glucose-Capillary: 146 mg/dL — ABNORMAL HIGH (ref 70–99)

## 2022-02-28 NOTE — Progress Notes (Signed)
MAZE, CORNIEL ([id number]) [id number] Page 1 of 2 Visit Report for 02/28/2022 Arrival Information Details Patient Name: Date of Service: Raymond [name] 02/28/2022 10:00 A M Medical Record Number: [id number] Patient Account Number: [id number] Date of Birth/Sex: Treating RN: 30-Dec-1965 (57 y.o. M) Primary Care Kobyn Kray: [name] Other Clinician: Referring Hokulani Rogel: Treating Safina Huard/Extender: Hazle Coca in Treatment: 8 Visit Information History Since Last Visit Added or deleted any medications: No Patient Arrived: Ambulatory Any new allergies or adverse reactions: No Arrival Time: 10:04 Had a fall or experienced change in No Accompanied By: self activities of daily living that may affect Transfer Assistance: None risk of falls: Patient Identification Verified: Yes Signs or symptoms of abuse/neglect since No Secondary Verification Process Completed: Yes last visito Patient Requires Transmission-Based Precautions: No Hospitalized since last visit: No Patient Has Alerts: Yes Implantable device outside of the clinic No Patient Alerts: Type II Diabetic excluding Aspirin '81mg'$  cellular tissue based products placed in the ABI L [place]>220 center since last visit: Has Dressing in Place as Prescribed: Yes Has Footwear/Offloading in Place as Yes Prescribed: Left: Surgical Shoe with Pressure Relief Insole Pain Present Now: No Electronic Signature(s) Signed: 02/28/2022 1:53:23 PM By: [name] RCP, RRT CHT , , Entered By: [name] on 02/28/2022 11:56:32 , -------------------------------------------------------------------------------- Encounter Discharge Information Details Patient Name: Date of Service: Raymond [name], Raymond Negus D. 02/28/2022 10:00 A M Medical Record Number: [id number] Patient Account Number: [id number] Date of Birth/Sex: Treating RN: Dec 24, 1965 (57 y.o. [name],  [name] Primary Care Jakaiden Fill: [name] Other Clinician: [name] Referring Malaijah Houchen: Treating Mikhala Kenan/Extender: Hazle Coca in Treatment: 8 Encounter Discharge Information Items Discharge Condition: Stable Ambulatory Status: Ambulatory Discharge Destination: Home Raymond [name], Raymond [name] ([id number]) [id number] Page 2 of 2 Transportation: Other Accompanied By: transportation Schedule Follow-up Appointment: Yes Clinical Summary of Care: Notes Patient has an HBO treatment scheduled on 03/01/22 at 10:00 am/ Electronic Signature(s) Signed: 02/28/2022 1:53:23 PM By: [name] RCP, RRT CHT , , Entered By: [name] on 02/28/2022 13:52:58 , -------------------------------------------------------------------------------- Vitals Details Patient Name: Date of Service: Raymond [name], Raymond Negus D. 02/28/2022 10:00 A M Medical Record Number: [id number] Patient Account Number: [id number] Date of Birth/Sex: Treating RN: 07/28/1965 (57 y.o. M) Primary Care Reef Achterberg: [name] Other Clinician: Referring Cong Hightower: Treating Nadia Viar/Extender: Hazle Coca in Treatment: 8 Vital Signs Time Taken: 10:01 Temperature (F): 98.5 Height (in): 74 Pulse (bpm): 72 Weight (lbs): 371 Respiratory Rate (breaths/min): 18 Body Mass Index (BMI): 47.6 Blood Pressure (mmHg): 130/68 Capillary Blood Glucose (mg/dl): 193 Reference Range: 80 - 120 mg / dl Electronic Signature(s) Signed: 02/28/2022 1:53:23 PM By: [name] RCP, RRT CHT , , Entered By: [name] on 02/28/2022 11:57:45 ,

## 2022-03-01 ENCOUNTER — Encounter: Payer: Medicare HMO | Admitting: Physician Assistant

## 2022-03-01 DIAGNOSIS — E11621 Type 2 diabetes mellitus with foot ulcer: Secondary | ICD-10-CM | POA: Diagnosis not present

## 2022-03-01 LAB — GLUCOSE, CAPILLARY: Glucose-Capillary: 185 mg/dL — ABNORMAL HIGH (ref 70–99)

## 2022-03-01 NOTE — Progress Notes (Signed)
Raymond [name] ([id number]) [id number] Page 1 of 2 Visit Report for 03/01/2022 Arrival Information Details Patient Name: Date of Service: Raymond [name] 03/01/2022 10:00 A M Medical Record Number: [id number] Patient Account Number: [id number] Date of Birth/Sex: Treating RN: November 12, 1965 (57 y.o. M) Primary Care Basilia Stuckert: [name] Other Clinician: Referring Valera Vallas: Treating Anna Livers/Extender: Hazle Coca in Treatment: 8 Visit Information History Since Last Visit Added or deleted any medications: No Patient Arrived: Ambulatory Any new allergies or adverse reactions: No Arrival Time: 09:40 Had a fall or experienced change in No Accompanied By: self activities of daily living that may affect Transfer Assistance: None risk of falls: Patient Identification Verified: Yes Signs or symptoms of abuse/neglect since last visito No Secondary Verification Process Completed: Yes Hospitalized since last visit: No Patient Requires Transmission-Based Precautions: No Implantable device outside of the clinic excluding No Patient Has Alerts: Yes cellular tissue based products placed in the center Patient Alerts: Type II Diabetic since last visit: Aspirin '81mg'$  Pain Present Now: No ABI L [place]>220 Electronic Signature(s) Signed: 03/01/2022 1:29:06 PM By: [name] RCP, RRT CHT , , Entered By: [name] on 03/01/2022 11:07:53 , -------------------------------------------------------------------------------- Encounter Discharge Information Details Patient Name: Date of Service: Raymond [name], Elder Raymond D. 03/01/2022 10:00 A M Medical Record Number: [id number] Patient Account Number: [id number] Date of Birth/Sex: Treating RN: Jun 04, 1965 (57 y.o. [name], [name] Primary Care Whittley Carandang: [name] Other Clinician: [name] Referring Elgie Landino: Treating Amelya Mabry/Extender: Hazle Coca in Treatment: 8 Encounter Discharge Information Items Discharge Condition: Stable Ambulatory Status: Ambulatory Discharge Destination: Home Transportation: Other Accompanied By: Transportation Schedule Follow-up Appointment: Yes Clinical Summary of Care: KIKO, RIPP ([id number]) [phone].pdf Page 2 of 2 Notes Patient has an HBO treatment scheduled on 03/02/22 at 10:00 am. Electronic Signature(s) Signed: 03/01/2022 1:29:06 PM By: [name] RCP, RRT CHT , , Entered By: [name] on 03/01/2022 13:28:48 , -------------------------------------------------------------------------------- Vitals Details Patient Name: Date of Service: Raymond [name], Elder Raymond D. 03/01/2022 10:00 A M Medical Record Number: [id number] Patient Account Number: [id number] Date of Birth/Sex: Treating RN: [birth year] (57 y.o. M) Primary Care Revin Corker: [name] Other Clinician: Referring Janaa Acero: Treating Demarquez Ciolek/Extender: Hazle Coca in Treatment: 8 Vital Signs Time Taken: 09:43 Temperature (F): 98.9 Height (in): 74 Pulse (bpm): 72 Weight (lbs): 371 Respiratory Rate (breaths/min): 18 Body Mass Index (BMI): 47.6 Blood Pressure (mmHg): 110/62 Capillary Blood Glucose (mg/dl): 185 Reference Range: 80 - 120 mg / dl Electronic Signature(s) Signed: 03/01/2022 1:29:06 PM By: [name] RCP, RRT CHT , , Entered By: [name] on 03/01/2022 11:08:31 ,

## 2022-03-01 NOTE — Progress Notes (Signed)
Raymond [name], Raymond [name] ([id number]) [id number] Page 1 of 2 Visit Report for 02/28/2022 HBO Details Patient Name: Date of Service: Raymond [name]. 02/28/2022 10:00 [place] Record Number: [id number] Patient Account Number: [id number] Date of Birth/Sex: Treating RN: 09/12/1965 (58 y.o. 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 HBO Treatment Details Treatment Number: 4 Patient Type: Outpatient Chamber Type: Monoplace Chamber Serial #: [id number] Treatment Protocol: 2.0 ATA with 90 minutes oxygen, and no air breaks Treatment Details Compression Rate Down: 1.5 psi / minute De-Compression Rate Up: 1.5 psi / minute Air breaks and breathing Decompress Decompress Compress Tx Pressure Begins Reached periods Begins Ends (leave unused spaces blank) Chamber Pressure (ATA 1 2 ------2 1 ) Clock Time (24 hr) 10:29 10:40 - - - - - - 12:10 12:23 Treatment Length: 114 (minutes) Treatment Segments: 4 Vital Signs Capillary Blood Glucose Reference Range: 80 - 120 mg / dl HBO Diabetic Blood Glucose Intervention Range: <131 mg/dl or >249 mg/dl Type: Time Vitals Blood Respiratory Capillary Blood Glucose Pulse Action Pulse: Temperature: Taken: Pressure: Rate: Glucose (mg/dl): Meter #: Oximetry (%) Taken: Pre 10:01 130/68 72 18 98.5 193 1 none per protocol Post 12:28 138/64 78 18 97.8 146 1 none per protocol Treatment Response Treatment Toleration: Well Treatment Completion Status: Treatment Completed without Adverse Event Electronic Signature(s) Signed: 02/28/2022 1:53:23 PM By: [name] RCP, RRT CHT , , Signed: 02/28/2022 8:05:55 PM By: [name] PA-C Entered By: [name] on 02/28/2022 13:52:02 , [name] ([id number]) [id number] Page 2 of 2 -------------------------------------------------------------------------------- HBO Safety Checklist Details Patient Name: Date of Service: Raymond [name]. 02/28/2022 10:00 A M Medical Record Number: [id number] Patient Account Number: [id number] Date of Birth/Sex: Treating RN: Mar 05, 1965 (56 y.o. M) Primary Care Johnathyn Viscomi: [name] Other Clinician: Referring Herman Mell: Treating Umberto Pavek/Extender: Hazle Coca in Treatment: 8 HBO Safety Checklist Items Safety Checklist Consent Form Signed Patient voided / foley secured and emptied When did you last eato 02/28/22 am Last dose of injectable or oral agent 02/28/22 am Ostomy pouch emptied and vented if applicable NA All implantable devices assessed, documented and approved NA Intravenous access site secured and place NA Valuables secured Linens and cotton and cotton/polyester blend (less than 51% polyester) Personal oil-based products / skin lotions / body lotions removed Wigs or hairpieces removed NA Smoking or tobacco materials removed NA Books / newspapers / magazines / loose paper removed NA Cologne, aftershave, perfume and deodorant removed Jewelry removed (may wrap wedding band) Make-up removed NA Hair care products removed Battery operated devices (external) removed NA Heating patches and chemical warmers removed NA Titanium eyewear removed NA Nail polish cured greater than 10 hours NA Casting material cured greater than 10 hours NA Hearing aids removed NA Loose dentures or partials removed NA Prosthetics have been removed NA Patient demonstrates correct use of air break device (if applicable) Patient concerns have been addressed Patient grounding bracelet  on and cord attached to chamber Specifics for Inpatients (complete in addition to above) Medication sheet sent with patient Intravenous medications needed or due during therapy sent with patient Drainage tubes (e.g. nasogastric tube or chest tube secured and vented) Endotracheal or Tracheotomy tube secured  Cuff deflated of air and inflated with saline Airway suctioned Electronic Signature(s) Signed: 02/28/2022 1:53:23 PM By: [name] RCP, RRT CHT , , Entered By: [name] on 02/28/2022 11:58:20 ,

## 2022-03-01 NOTE — Progress Notes (Signed)
Raymond [name], Raymond [name] ([id number]) [id number] Page 1 of 2 Visit Report for 03/01/2022 HBO Details Patient Name: Date of Service: Raymond [name] 03/01/2022 10:00 A M Medical Record Number: [id number] Patient Account Number: [id number] Date of Birth/Sex: Treating RN: May 16, 1965 (58 y.o. 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 HBO Treatment Details Treatment Number: 5 Patient Type: Outpatient Chamber Type: Monoplace Chamber Serial #: [id number] Treatment Protocol: 2.0 ATA with 90 minutes oxygen, and no air breaks Treatment Details Compression Rate Down: 1.5 psi / minute De-Compression Rate Up: 1.5 psi / minute Air breaks and breathing Decompress Decompress Compress Tx Pressure Begins Reached periods Begins Ends (leave unused spaces blank) Chamber Pressure (ATA 1 2 ------2 1 ) Clock Time (24 hr) 10:20 10:32 - - - - - - 12:02 12:13 Treatment Length: 113 (minutes) Treatment Segments: 4 Vital Signs Capillary Blood Glucose Reference Range: 80 - 120 mg / dl HBO Diabetic Blood Glucose Intervention Range: <131 mg/dl or >249 mg/dl Type: Time Vitals Blood Respiratory Capillary Blood Glucose Pulse Action Pulse: Temperature: Taken: Pressure: Rate: Glucose (mg/dl): Meter #: Oximetry (%) Taken: Pre 09:43 110/62 72 18 98.9 185 1 none per protocol Post 12:19 110/62 72 18 98.3 196 1 none per protocol Treatment Response Treatment Toleration: Well Treatment Completion Status: Treatment Completed without Adverse Event Electronic Signature(s) Signed: 03/01/2022 1:29:06 PM By: [name] RCP, RRT CHT , , Signed: 03/01/2022 4:45:54 PM By: [name] PA-C Entered By: [name] on 03/01/2022 13:27:45 , [name] ([id number]) [id number] Page 2 of 2 -------------------------------------------------------------------------------- HBO Safety Checklist Details Patient Name: Date of Service: Raymond [name] 03/01/2022 10:00 A M Medical Record Number: [id number] Patient Account Number: [id number] Date of Birth/Sex: Treating RN: 1965-07-06 (57 y.o. M) Primary Care Romello Hoehn: [name] Other Clinician: Referring Kyrus Hyde: Treating Tajee Savant/Extender: Hazle Coca in Treatment: 8 HBO Safety Checklist Items Safety Checklist Consent Form Signed Patient voided / foley secured and emptied When did you last eato 03/01/22 am Last dose of injectable or oral agent 03/01/22 am Ostomy pouch emptied and vented if applicable NA All implantable devices assessed, documented and approved NA Intravenous access site secured and place NA Valuables secured Linens and cotton and cotton/polyester blend (less than 51% polyester) Personal oil-based products / skin lotions / body lotions removed Wigs or hairpieces removed NA Smoking or tobacco materials removed NA Books / newspapers / magazines / loose paper removed NA Cologne, aftershave, perfume and deodorant removed Jewelry removed (may wrap wedding band) Make-up removed NA Hair care products removed Battery operated devices (external) removed NA Heating patches and chemical warmers removed NA Titanium eyewear removed NA Nail polish cured greater than 10 hours NA Casting material cured greater than 10 hours NA Hearing aids removed NA Loose dentures or partials removed NA Prosthetics have been removed NA Patient demonstrates correct use of air break device (if applicable) Patient concerns have been addressed Patient grounding bracelet  on and cord attached to chamber Specifics for Inpatients (complete in addition to above) Medication sheet sent with patient Intravenous medications needed or due during therapy sent with patient Drainage tubes (e.g. nasogastric tube or chest tube secured and vented) Endotracheal or Tracheotomy tube secured  Cuff deflated of air and inflated with saline Airway suctioned Electronic Signature(s) Signed: 03/01/2022 1:29:06 PM By: [name] RCP, RRT CHT , , Entered By: [name] on 03/01/2022 11:09:42 ,

## 2022-03-02 ENCOUNTER — Encounter (HOSPITAL_BASED_OUTPATIENT_CLINIC_OR_DEPARTMENT_OTHER): Payer: Medicare HMO | Admitting: Internal Medicine

## 2022-03-02 DIAGNOSIS — E11621 Type 2 diabetes mellitus with foot ulcer: Secondary | ICD-10-CM | POA: Diagnosis not present

## 2022-03-02 DIAGNOSIS — M86372 Chronic multifocal osteomyelitis, left ankle and foot: Secondary | ICD-10-CM | POA: Diagnosis not present

## 2022-03-02 DIAGNOSIS — L97522 Non-pressure chronic ulcer of other part of left foot with fat layer exposed: Secondary | ICD-10-CM | POA: Diagnosis not present

## 2022-03-02 LAB — GLUCOSE, CAPILLARY
Glucose-Capillary: 254 mg/dL — ABNORMAL HIGH (ref 70–99)
Glucose-Capillary: 295 mg/dL — ABNORMAL HIGH (ref 70–99)

## 2022-03-02 NOTE — Progress Notes (Signed)
ADIT, RIDDLES ([id number]) [id number] Page 1 of 2 Visit Report for 03/02/2022 HBO Details Patient Name: Date of Service: Raymond [name]. 03/02/2022 10:00 [place] Record Number: [id number] Patient Account Number: [id number] Date of Birth/Sex: Treating RN: 29-Apr-1965 (56 y.o. 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 HBO Treatment Details Treatment Number: 6 Patient Type: Outpatient Chamber Type: Monoplace Chamber Serial #: [id number] Treatment Protocol: 2.0 ATA with 90 minutes oxygen, and no air breaks Treatment Details Compression Rate Down: 1.5 psi / minute De-Compression Rate Up: 1.5 psi / minute Air breaks and breathing Decompress Decompress Compress Tx Pressure Begins Reached periods Begins Ends (leave unused spaces blank) Chamber Pressure (ATA 1 2 ------2 1 ) Clock Time (24 hr) 10:37 10:49 - - - - - - 12:19 12:29 Treatment Length: 112 (minutes) Treatment Segments: 4 Vital Signs Capillary Blood Glucose Reference Range: 80 - 120 mg / dl HBO Diabetic Blood Glucose Intervention Range: <131 mg/dl or >249 mg/dl Type: Time Vitals Blood Respiratory Capillary Blood Glucose Pulse Action Pulse: Temperature: Taken: Pressure: Rate: Glucose (mg/dl): Meter #: Oximetry (%) Taken: Pre 10:22 148/76 66 18 97.7 254 1 none per protocol Post 12:36 138/78 66 18 98 295 1 none per protocol Treatment Response Treatment Toleration: Well Treatment Completion Status: Treatment Completed without Adverse Event HBO Attestation I certify that I supervised this HBO treatment in  accordance with Medicare guidelines. A trained emergency response team is readily available per Yes hospital policies and procedures. Continue HBOT as ordered. Yes Electronic Signature(s) Signed: 03/02/2022 2:05:31 PM By: [name] RCP, RRT CHT , , Signed: 03/02/2022 2:37:56 PM By: [name] DO Previous Signature: 03/02/2022 1:38:11 PM Version By: Sherrin Daisy ([id number]) [id number] Page 2 of 2 Entered By: [name] on 03/02/2022 13:55:40 , -------------------------------------------------------------------------------- HBO Safety Checklist Details Patient Name: Date of Service: Raymond [name] 03/02/2022 10:00 [place] Record Number: [id number] Patient Account Number: [id number] Date of Birth/Sex: Treating RN: 11-Jan-1966 (56 y.o. [name]) Carlene Coria Primary Care Aly Seidenberg: [name] Other Clinician: [name] Referring Tal Kempker: Treating Dolton Shaker/Extender: [name] in Treatment: 8 HBO Safety Checklist Items Safety Checklist Consent Form Signed Patient voided / foley secured and emptied When did you last eato 03/02/22 am Last dose of injectable or oral agent 03/02/22 am Ostomy pouch emptied and vented if applicable NA All implantable devices assessed, documented and approved NA Intravenous access site secured and place NA Valuables secured Linens and cotton and cotton/polyester blend (less than 51% polyester) Personal oil-based products / skin lotions / body lotions removed Wigs or hairpieces removed NA Smoking or tobacco materials removed NA Books / newspapers / magazines / loose paper removed NA Cologne, aftershave, perfume and deodorant removed Jewelry removed (may wrap wedding band) Make-up removed NA Hair care products removed Battery operated devices (external) removed NA Heating patches and chemical warmers removed NA Titanium  eyewear removed NA Nail polish cured greater than 10 hours NA Casting material cured greater than 10 hours NA Hearing aids removed NA Loose dentures or partials removed NA Prosthetics have been removed NA Patient demonstrates correct use of air break device (if applicable) Patient concerns have been addressed Patient  grounding bracelet on and cord attached to chamber Specifics for Inpatients (complete in addition to above) Medication sheet sent with patient Intravenous medications needed or due during therapy sent with patient Drainage tubes (e.g. nasogastric tube or chest tube secured and vented) Endotracheal or Tracheotomy tube secured Cuff deflated of air and inflated with saline Airway suctioned Electronic Signature(s) Signed: 03/02/2022 2:05:31 PM By: [name] RCP, RRT CHT , , Entered By: [name] on 03/02/2022 10:49:18 ,

## 2022-03-02 NOTE — Progress Notes (Signed)
CHRISTYAN, REGER ([id number]) [id number] Page 1 of 2 Visit Report for 03/02/2022 Arrival Information Details Patient Name: Date of Service: Raymond [name] 03/02/2022 10:00 A M Medical Record Number: [id number] Patient Account Number: [id number] Date of Birth/Sex: Treating RN: October 11, 1965 (56 y.o. [name]) Carlene Coria Primary Care Shreyansh Tiffany: [name] Other Clinician: [name] Referring Illana Nolting: Treating Kamerin Grumbine/Extender: [name] in Treatment: 8 Visit Information History Since Last Visit Added or deleted any medications: No Patient Arrived: Ambulatory Any new allergies or adverse reactions: No Arrival Time: 10:25 Had a fall or experienced change in No Accompanied By: self activities of daily living that may affect Transfer Assistance: None risk of falls: Patient Identification Verified: Yes Signs or symptoms of abuse/neglect since No Secondary Verification Process Completed: Yes last visito Patient Requires Transmission-Based Precautions: No Hospitalized since last visit: No Patient Has Alerts: Yes Implantable device outside of the clinic No Patient Alerts: Type II Diabetic excluding Aspirin '81mg'$  cellular tissue based products placed in the ABI L [place]>220 center since last visit: Has Dressing in Place as Prescribed: Yes Has Footwear/Offloading in Place as Yes Prescribed: Left: Surgical Shoe with Pressure Relief Insole Pain Present Now: No Electronic Signature(s) Signed: 03/02/2022 2:05:31 PM By: [name] RCP, RRT CHT , , Entered By: [name] on 03/02/2022 10:47:32 , -------------------------------------------------------------------------------- Encounter Discharge Information Details Patient Name: Date of Service: Raymond Eke D. 03/02/2022 10:00 [place] Record Number: [id number] Patient Account Number: [id number] Date of Birth/Sex: Treating  RN: 10/30/1965 (56 y.o. [name]) Carlene Coria Primary Care Jamisen Duerson: [name] Other Clinician: [name] Referring Tipton Ballow: Treating Janit Cutter/Extender: [name] in Treatment: 8 Encounter Discharge Information Items Discharge Condition: Stable Ambulatory Status: Ambulatory Discharge Destination: Home Raymond [name] ([id number]) [phone].pdf Page 2 of 2 Transportation: Other Accompanied By: transportation Schedule Follow-up Appointment: Yes Clinical Summary of Care: Notes Patient has an HBO treatment scheduled on 03/23/22 at 10:00 am. Electronic Signature(s) Signed: 03/02/2022 2:05:31 PM By: [name] RCP, RRT CHT , , Entered By: [name] on 03/02/2022 13:34:39 , -------------------------------------------------------------------------------- Vitals Details Patient Name: Date of Service: Raymond [name], Raymond Negus D. 03/02/2022 10:00 [facility] Record Number: [id number] Patient Account Number: [id number] Date of Birth/Sex: Treating RN: Nov 18, 1965 (56 y.o. [name]) Carlene Coria Primary Care Madalen Gavin: [name] Other Clinician: [name] Referring Cherylyn Sundby: Treating Ulani Degrasse/Extender: [name] in Treatment: 8 Vital Signs Time Taken: 10:22 Temperature (F): 97.7 Height (in): 74 Pulse (bpm): 66 Weight (lbs): 371 Respiratory Rate (breaths/min): 18 Body Mass Index (BMI): 47.6 Blood Pressure (mmHg): 148/76 Capillary Blood Glucose (mg/dl): 254 Reference Range: 80 - 120 mg / dl Electronic Signature(s) Signed: 03/02/2022 2:05:31 PM By: [name] RCP, RRT CHT , , Entered By: [name] on 03/02/2022 10:47:37 ,

## 2022-03-03 ENCOUNTER — Encounter: Payer: Medicare HMO | Attending: Physician Assistant | Admitting: Physician Assistant

## 2022-03-03 ENCOUNTER — Encounter: Payer: Medicare HMO | Admitting: Physician Assistant

## 2022-03-03 DIAGNOSIS — E11621 Type 2 diabetes mellitus with foot ulcer: Secondary | ICD-10-CM | POA: Insufficient documentation

## 2022-03-03 DIAGNOSIS — I110 Hypertensive heart disease with heart failure: Secondary | ICD-10-CM | POA: Diagnosis not present

## 2022-03-03 DIAGNOSIS — M86372 Chronic multifocal osteomyelitis, left ankle and foot: Secondary | ICD-10-CM | POA: Diagnosis not present

## 2022-03-03 DIAGNOSIS — E1140 Type 2 diabetes mellitus with diabetic neuropathy, unspecified: Secondary | ICD-10-CM | POA: Insufficient documentation

## 2022-03-03 DIAGNOSIS — I5042 Chronic combined systolic (congestive) and diastolic (congestive) heart failure: Secondary | ICD-10-CM | POA: Insufficient documentation

## 2022-03-03 DIAGNOSIS — L97522 Non-pressure chronic ulcer of other part of left foot with fat layer exposed: Secondary | ICD-10-CM | POA: Diagnosis not present

## 2022-03-03 LAB — GLUCOSE, CAPILLARY
Glucose-Capillary: 167 mg/dL — ABNORMAL HIGH (ref 70–99)
Glucose-Capillary: 194 mg/dL — ABNORMAL HIGH (ref 70–99)

## 2022-03-03 NOTE — Progress Notes (Signed)
ASTER, ECKRICH ([id number]) [id number] Page 1 of 2 Visit Report for 03/03/2022 HBO Details Patient Name: Date of Service: Raymond [name]. 03/03/2022 10:00 A M Medical Record Number: [id number] Patient Account Number: [id number] Date of Birth/Sex: Treating RN: 23-Aug-1965 (57 y.o. 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 HBO Treatment Details Treatment Number: 7 Patient Type: Outpatient Chamber Type: Monoplace Chamber Serial #: [id number] Treatment Protocol: 2.0 ATA with 90 minutes oxygen, and no air breaks Treatment Details Compression Rate Down: 1.5 psi / minute De-Compression Rate Up: 1.5 psi / minute Air breaks and breathing Decompress Decompress Compress Tx Pressure Begins Reached periods Begins Ends (leave unused spaces blank) Chamber Pressure (ATA 1 2 ------2 1 ) Clock Time (24 hr) 10:35 10:45 - - - - - - 12:17 12:27 Treatment Length: 112 (minutes) Treatment Segments: 4 Vital Signs Capillary Blood Glucose Reference Range: 80 - 120 mg / dl HBO Diabetic Blood Glucose Intervention Range: <131 mg/dl or >249 mg/dl Type: Time Vitals Blood Respiratory Capillary Blood Glucose Pulse Action Pulse: Temperature: Taken: Pressure: Rate: Glucose (mg/dl): Meter #: Oximetry (%) Taken: Pre 09:27 113/68 78 18 98.2 167 1 none per protocol Post 12:33 112/60 66 18 98.3 194 1 none per protocol Treatment Response Treatment Toleration: Well Treatment Completion Status: Treatment Completed without Adverse Event Electronic Signature(s) Signed: 03/03/2022 1:41:33 PM By: [name] RCP, RRT CHT , , Signed: 03/03/2022 4:41:53 PM By: [name] PA-C Entered By: [name] on 03/03/2022 13:40:14 , [name] ([id number]) [id number] Page 2 of 2 -------------------------------------------------------------------------------- HBO Safety Checklist Details Patient Name: Date of Service: Raymond [name] 03/03/2022 10:00 A M Medical Record Number: [id number] Patient Account Number: [id number] Date of Birth/Sex: Treating RN: April 08, 1965 (57 y.o. [name], [name] Primary Care Chalice Philbert: [name] Other Clinician: [name] Referring Ruthanna Macchia: Treating Luiza Carranco/Extender: [name] Weeks in Treatment: 8 HBO Safety Checklist Items Safety Checklist Consent Form Signed Patient voided / foley secured and emptied When did you last eato 03/03/22 am Last dose of injectable or oral agent 03/03/22 am metformin Ostomy pouch emptied and vented if applicable NA All implantable devices assessed, documented and approved NA Intravenous access site secured and place NA Valuables secured Linens and cotton and cotton/polyester blend (less than 51% polyester) Personal oil-based products / skin lotions / body lotions removed Wigs or hairpieces removed NA Smoking or tobacco materials removed NA Books / newspapers / magazines / loose paper removed NA Cologne, aftershave, perfume and deodorant removed Jewelry removed (may wrap wedding band) Make-up removed NA Hair care products removed Battery operated devices (external) removed NA Heating patches and chemical warmers removed NA Titanium eyewear removed NA Nail polish cured greater than 10 hours NA Casting material cured greater than 10 hours NA Hearing aids removed NA Loose dentures or partials removed NA Prosthetics have been removed NA Patient demonstrates correct use of air break device (if applicable) Patient concerns have been  addressed Patient grounding bracelet on and cord attached to chamber Specifics for Inpatients (complete in addition to above) Medication sheet sent with patient Intravenous medications needed or due during therapy sent with patient Drainage tubes (e.g. nasogastric tube or chest tube secured and vented)  Endotracheal or Tracheotomy tube secured Cuff deflated of air and inflated with saline Airway suctioned Electronic Signature(s) Signed: 03/03/2022 1:41:33 PM By: [name] RCP, RRT CHT , , Entered By: [name] on 03/03/2022 10:01:46 ,

## 2022-03-03 NOTE — Progress Notes (Addendum)
Raymond [name], [name] ([id number]) [id number] Page 1 of 11 Visit Report for 03/03/2022 Chief Complaint Document Details Patient Name: Date of Service: Raymond [name] 03/03/2022 8:45 A M Medical Record Number: [id number] Patient Account Number: [id number] Date of Birth/Sex: Treating RN: [birth year] (56 y.o. [name] Primary Care Provider: [name] Other Clinician: Referring Provider: Treating Provider/Extender: Hazle Coca in Treatment: 8 Information Obtained from: Patient Chief Complaint Left foot ulcer with chronic osteomyelitis Electronic Signature(s) Signed: 03/03/2022 8:51:29 AM By: [name] PA-C Entered By: [name] on 03/03/2022 08:51:29 -------------------------------------------------------------------------------- Debridement Details Patient Name: Date of Service: Raymond Eke D. 03/03/2022 8:45 A M Medical Record Number: [id number] Patient Account Number: [id number] Date of Birth/Sex: Treating RN: [birth year] (57 y.o. [name] Primary Care Provider: [name] Other Clinician: Referring Provider: Treating Provider/Extender: [name] Weeks in Treatment: 8 Debridement Performed for Assessment: Wound #2 Left,Medial Metatarsal head first Performed By: Physician Tommie Sams., PA-C Debridement Type: Debridement Severity of Tissue Pre Debridement: Limited to breakdown of skin Level of Consciousness (Pre-procedure): Awake and Alert Pre-procedure Verification/Time Out Yes - 09:12 Taken: T Area Debrided (L x W): otal 0.6 (cm) x 0.1 (cm) = 0.06 (cm) Tissue and other material debrided: Viable, Non-Viable, Callus, Subcutaneous Level: Skin/Subcutaneous Tissue Debridement Description: Excisional Instrument: Curette Bleeding: Minimum Hemostasis Achieved: Pressure Response to Treatment: Procedure was tolerated well Level of Consciousness (Post- Awake and  Alert procedure): Post Debridement Measurements of Total Wound Raymond [name] ([id number]) [id number] Page 2 of 11 Length: (cm) 0.6 Width: (cm) 0.1 Depth: (cm) 0.4 Volume: (cm) 0.019 Character of Wound/Ulcer Post Debridement: Stable Severity of Tissue Post Debridement: Fat layer exposed Post Procedure Diagnosis Same as Pre-procedure Electronic Signature(s) Signed: 03/03/2022 4:41:53 PM By: [name] PA-C Signed: 03/04/2022 4:09:44 PM By: Gretta Cool, BSN, RN, CWS, Kim RN, BSN Entered By: Gretta Cool, BSN, RN, CWS, Kim on 03/03/2022 09:14:08 -------------------------------------------------------------------------------- Debridement Details Patient Name: Date of Service: Raymond Eke D. 03/03/2022 8:45 A M Medical Record Number: [id number] Patient Account Number: [id number] Date of Birth/Sex: Treating RN: 18-May-1965 (57 y.o. Raymond [name] Primary Care Provider: [name] Other Clinician: Referring Provider: Treating Provider/Extender: Hazle Coca in Treatment: 8 Debridement Performed for Assessment: Wound #3 Left T Third oe Performed By: Physician Tommie Sams., PA-C Debridement Type: Debridement Severity of Tissue Pre Debridement: Fat layer exposed Level of Consciousness (Pre-procedure): Awake and Alert Pre-procedure Verification/Time Out Yes - 09:12 Taken: T Area Debrided (L x W): otal 0.1 (cm) x 0.3 (cm) = 0.03 (cm) Tissue and other material debrided: Viable, Non-Viable, Callus, Subcutaneous Level: Skin/Subcutaneous Tissue Debridement Description: Excisional Instrument: Curette Bleeding: Minimum Hemostasis Achieved: Pressure Response to Treatment: Procedure was tolerated well Level of Consciousness (Post- Awake and Alert procedure): Post Debridement Measurements of Total Wound Length: (cm) 0.1 Width: (cm) 0.3 Depth: (cm) 0.2 Volume: (cm) 0.005 Character of Wound/Ulcer Post Debridement:  Stable Severity of Tissue Post Debridement: Limited to breakdown of skin Post Procedure Diagnosis Same as Pre-procedure Electronic Signature(s) Signed: 03/03/2022 4:41:53 PM By: [name] PA-C Signed: 03/04/2022 4:09:44 PM By: Gretta Cool, BSN, RN, CWS, Kim RN, BSN Entered By: Gretta Cool, BSN, RN, CWS, Kim on 03/03/2022 09:25:24 Raymond [name] ([id number]) [id number] Page 3 of 11 -------------------------------------------------------------------------------- HPI Details Patient Name: Date of Service: Raymond [name] 03/03/2022 8:45 A M Medical Record Number: [id number] Patient Account Number: [id number] Date of Birth/Sex: Treating RN: [birth year] (57 y.o. Raymond [name]  Primary Care Provider: [name] Other Clinician: Referring Provider: Treating Provider/Extender: Hazle Coca in Treatment: 8 History of Present Illness HPI Description: 09/05/17-He is seeing an initial evaluation for a left plantar foot ulcer. He has a remote history of left great toe amputation. He states that 4-6 weeks ago he noted callus formation and ulceration. He has not seen primary care regarding this. He is not currently on antibiotic therapy. He does not routinely follow with podiatry. He states diabetic foot wear will arrive early next week. The EHR shows an A1c of 9% approximate 4 months ago but he states he had one and primary care a few weeks ago but does not know the results. He is neuropathic and does not complain of any pain, he is currently wearing crocs. 09/12/17-he is here in follow up evaluation for left plantar foot ulcer. There is improvement in both appearance and measurement. We will continue with same treatment plan and he will follow up next week. 09/19/17 on evaluation today patient actually appears to be doing excellent in regard to his ulcer on the invitation site of his left great foot plantar aspect. He's been tolerating the dressing  changes without complication. In fact with the Prisma and the current measures he has been shown signs of excellent improvement week by week up to this point. We have been to breeding the wound in this seems to have been of great benefit for him. Fortunately there is no evidence of infection. 09/26/17 on evaluation today patient appears to be doing rather well in regard to his wound. He did not know quite as much improvement this week as compared to last week. Nonetheless he still continues to show signs of improving to some degree. I do believe he may benefit from an offloading shoe. No fevers, chills, nausea, or vomiting noted at this time. 10/03/17 on evaluation today patient actually appears to be doing much better in regard to the amputation site plantar foot ulcer. Overall this appears significantly smaller even compared to previous. He's been tolerating the dressing changes without complication. He did get his diabetic shoes and I did have a look at them they appear to be fairly good. Nonetheless I do think that for the time being I would probably recommend he continue with the offloading shoe that we have been utilizing just due to the fact that with the dressing I don't know that his diabetic shoes are going to work as appropriately as far as not called an additional pressure and irritation to the area in question. He understands. 10/10/17 on evaluation today patient actually appears to be doing very well in regard to his plantar foot ulcer. He has been tolerating the dressing changes without complication. I do feel like he's making signs of good improvement and in fact of the wound bed appears to be better although it may not be significantly changed in size it appears healthier and I do believe is showing signs of improving. Nonetheless we gonna keep working towards healing as far as that is concerned. There is no evidence of infection. 10/17/17 on evaluation today patient actually appears to be  doing poorly in regard to his plantar foot ulcer. Unfortunately other than just the area where the wound is itself there appears to be a blister that communicates with the wound unfortunately. This is more lateral to the wound itself and also to the distal point of the amputation site. There does not appear to be any evidence of cellulitis spreading at  the foot but I do believe some of the drainage from the site itself is. When in nature. Nonetheless this blister area I think needs to be removed in order to allow for appropriate healing of the ulcer itself I think that is gonna be difficult for it to heal otherwise. This was discussed with the patient today. 10/24/17 on evaluation today patient actually appears to be doing better compared to last week even post debridement. He has shown signs of improvement he did test positive for Staphylococcus aureus. With that being said he notes that he's not have any discomfort and in general he does feel like things seem to been doing better. Fortunately there's no additional blistering and no evidence of remaining infection at this time. No fevers, chills, nausea, or vomiting noted at this time. He has three days of antibiotic left. 10/31/17 on evaluation today patient actually appears to be doing very well in regard to his ulcer on the left foot. He has been tolerating the dressing changes without complication. With that being said fortunately there appears to be no evidence of infection I do think he's made progress compared to previous. No fevers, chills, nausea, or vomiting noted at this time. 11/14/17 on evaluation today patient actually appears to be doing excellent in regard to his amputation site ulcer on his foot. In fact this appears to be completely healed at this point. There is no evidence of infection nor underline abscess and I did thoroughly evaluate the periwound location. Overall I'm very pleased with how things appear. Readmission: 01-03-2022  upon evaluation today patient appears for reevaluation here in the clinic although it has been since 2019 that I last saw him. This again has been over 4 years. With that being said he is having an issue with a wound on his foot unfortunately. He has not had any recent x-rays he tells me at this point which is unfortunate as avoid definitely can need to get something started that can be one of the primary items to get moving forward with. With that being said I also think were probably can obtain a wound culture he is probably going to require some antibiotics at some point. I just want to make sure that we have him on the right thing when we do this. Currently the patient tells me that his wounds have been present in regard to the met head for about a month at this point. With that being said he also has a wound on the third toe which is the next toe this actually still present. This unfortunately has a lot of callus buildup around it but I think it is larger underneath than what it would appear on initial inspection. Patient does have a history of diabetes mellitus type 2, congestive heart failure, and COPD. 01-11-2022 upon evaluation patient's wounds actually are showing signs of doing about the same may be just slightly cleaner but definitely not where we want things to be as of yet. Fortunately there does not appear to be any signs of active infection locally nor systemically at this point which is great news. No fevers, chills, nausea, vomiting, or diarrhea. JEJUAN, SCALA ([id number]) [id number] Page 4 of 11 Of note patient has had his MRI but we do not have the results of that as of yet we are still waiting for the reading on this at this point. 01-18-2022 upon evaluation today patient presents after having had his MRI finally complete. It did show that he has evidence of  osteomyelitis of the first metatarsal head, third digit, and fourth digit. The third and  fourth digits seem to be early which is good news. Nonetheless we are still going to need to try to see about getting the infection under control he is already been on the antibiotic therapy which I think has done quite well. In fact I feel like he is showing signs of improvement already. 12/28; patient with a wound on the medial aspect of the left first metatarsal head and an area on the left third toe with slight hammer deformity. He has had a previous left first toe amputation. We are using Iodoflex on the wound. He is a diabetic a recent MRI showed osteomyelitis we have been giving him Cipro and Doxy which he appears to be tolerating well. 02-10-2022 upon evaluation today patient unfortunately has issues still with open wounds of his foot on the left. He does have known osteomyelitis at this point and that is something that we have been treating with oral antibiotics. I actually have not seen him personally since 19 December. In that time he has not had any debridement from that point until today based on what I can see as best I can tell anyway. Has been on Cipro as well as doxycycline. Nonetheless he does still seem to have open wounds today and I think that he would benefit from hyperbaric oxygen therapy. I discussed that with him today as well. 02-18-2022 upon evaluation today patient appears to be doing about the same in regard to his wounds. Fortunately I do not see any signs of infection although he still has areas that seem to initially close down but that he has a lot of callus that still open at both locations most on the metatarsal region as well as the toe. I think that he is appropriate for proceeding with the hyperbarics and I think the sooner we get this done to try to get these wounds healed the better off he will be. 02-25-2022 upon evaluation today patient's wounds do show signs of being dry get again. We were attempting to pack and some of the alginate dressing instead of doing the  collagen as everything was getting very dry but nonetheless this is still continue to be an ongoing issue. My suggestion based on what I am seeing is good to be that we actually switch to doing Xeroform which should hopefully keep this a little bit more moist and from hopefully closing up prematurely. The patient voiced understanding. 03-03-2022 upon evaluation today patient appears to be doing well with regard to his wounds things as before appear to be getting dry. We are using Xeroform to try to keep it open is much as possible but each time I see him he does tend to cover over the good news is each time he also seems to be getting a little bit smaller which is excellent. Fortunately I do not see any evidence of active infection locally nor systemically which is great news. I think this means that between the antibiotics and the hyperbarics were really on a good track here. Electronic Signature(s) Signed: 03/03/2022 9:41:53 AM By: [name] PA-C Entered By: [name] on 03/03/2022 09:41:52 -------------------------------------------------------------------------------- Physical Exam Details Patient Name: Date of Service: Raymond Eke D. 03/03/2022 8:45 A M Medical Record Number: [id number] Patient Account Number: [id number] Date of Birth/Sex: Treating RN: December 27, 1965 (58 y.o. [name] Primary Care Provider: [name] Other Clinician: Referring Provider: Treating Provider/Extender: [name]  [name] Weeks in Treatment: 8 Constitutional Obese and well-hydrated in no acute distress. Respiratory normal breathing without difficulty. Psychiatric this patient is able to make decisions and demonstrates good insight into disease process. Alert and Oriented x 3. pleasant and cooperative. Notes Upon inspection patient's wounds again are showing signs of being dried and somewhat callused over. For that reason I did go ahead and recommend that we see about  clearing away this today by debridement and he tolerated that without complication. Once debrided both areas did show wounds underneath which we measured out. Nonetheless we will get a continue to monitor for any signs of worsening ongoing and hopefully he will continue towards complete and total resolution. Electronic Signature(s) Signed: 03/03/2022 9:42:25 AM By: [name] PA-C Entered By: [name] on 03/03/2022 09:42:25 Raymond [name] ([id number]) [id number] Page 5 of 11 -------------------------------------------------------------------------------- Physician Orders Details Patient Name: Date of Service: Raymond [name] 03/03/2022 8:45 A M Medical Record Number: [id number] Patient Account Number: [id number] Date of Birth/Sex: Treating RN: Jan 03, 1966 (57 y.o. Raymond [name] Primary Care Provider: [name] Other Clinician: Referring Provider: Treating Provider/Extender: Hazle Coca in Treatment: 8 Verbal / Phone Orders: No Diagnosis Coding ICD-10 Coding Code Description [phone] Chronic multifocal osteomyelitis, left ankle and foot E11.621 Type 2 diabetes mellitus with foot ulcer L97.522 Non-pressure chronic ulcer of other part of left foot with fat layer exposed I50.42 Chronic combined systolic (congestive) and diastolic (congestive) heart failure J44.9 Chronic obstructive pulmonary disease, unspecified Follow-up Appointments Wound #3 Left T Third oe Return Appointment in 1 week. Bathing/ [employer] wounds with antibacterial soap and water. May shower; gently cleanse wound with antibacterial soap, rinse and pat dry prior to dressing wounds Off-Loading Open toe surgical shoe Hyperbaric Oxygen Therapy Wound #2 Left,Medial Metatarsal head first Indication and location: - [place]agoner Grade 3 ulcers of the left foot and 3rd toe If appropriate for treatment, begin HBOT per protocol: 2.0 ATA for  90 Minutes without A Breaks ir One treatment per day (delivered Monday through Friday unless otherwise specified in Special Instructions below): Total # of Treatments: - 40 A ntihistamine 30 minutes prior to HBO Treatment, difficulty clearing ears. Finger stick Blood Glucose Pre- and Post- HBOT Treatment. Follow Hyperbaric Oxygen Glycemia Protocol Wound #3 Left T Third oe Evaluate for HBO Therapy Indication and location: - [place] Grade 3 ulcers of the left foot and 3rd toe If appropriate for treatment, begin HBOT per protocol: 2.0 ATA for 90 Minutes without A Breaks ir One treatment per day (delivered Monday through Friday unless otherwise specified in Special Instructions below): Total # of Treatments: - 40 A ntihistamine 30 minutes prior to HBO Treatment, difficulty clearing ears. Finger stick Blood Glucose Pre- and Post- HBOT Treatment. Follow Hyperbaric Oxygen Glycemia Protocol Wound Treatment Wound #2 - Metatarsal head first Wound Laterality: Left, Medial Cleanser: Byram Ancillary Kit - 15 Day Supply (Generic) 3 x Per Week/30 Days Discharge Instructions: Use supplies as instructed; Kit contains: (15) Saline Bullets; (15) 3x3 Gauze; 15 pr Gloves Cleanser: Soap and Water 3 x Per Week/30 Days Discharge Instructions: Gently cleanse wound with antibacterial soap, rinse and pat dry prior to dressing wounds Prim Dressing: Xeroform 4x4-HBD (in/in) 3 x Per Week/30 Days ary Discharge Instructions: Apply Xeroform 4x4-HBD (in/in) as directed Secondary Dressing: Kerlix 4.5 x 4.1 (in/yd) 3 x Per Week/30 Days MAE, CIANCI ([id number]) [id number] Page 6 of 11 Discharge Instructions: Apply Kerlix 4.5 x 4.1 (in/yd) as instructed Secured  With: Medipore T - 39M Medipore H Soft Cloth Surgical T ape ape, 2x2 (in/yd) (Generic) 3 x Per Week/30 Days Wound #3 - T Third oe Wound Laterality: Left Cleanser: Byram Ancillary Kit - 15 Day Supply (Generic) 3 x Per Week/30  Days Discharge Instructions: Use supplies as instructed; Kit contains: (15) Saline Bullets; (15) 3x3 Gauze; 15 pr Gloves Cleanser: Soap and Water 3 x Per Week/30 Days Discharge Instructions: Gently cleanse wound with antibacterial soap, rinse and pat dry prior to dressing wounds Prim Dressing: Xeroform 4x4-HBD (in/in) 3 x Per Week/30 Days ary Discharge Instructions: Apply Xeroform 4x4-HBD (in/in) as directed Secondary Dressing: Kerlix 4.5 x 4.1 (in/yd) 3 x Per Week/30 Days Discharge Instructions: Apply Kerlix 4.5 x 4.1 (in/yd) as instructed Secured With: Medipore T - 39M Medipore H Soft Cloth Surgical T ape ape, 2x2 (in/yd) (Generic) 3 x Per Week/30 Days GLYCEMIA INTERVENTIONS PROTOCOL PRE-HBO GLYCEMIA INTERVENTIONS ACTION INTERVENTION Obtain pre-HBO capillary blood glucose (ensure 1 physician order is in chart). A. Notify HBO physician and await physician orders. 2 If result is 70 mg/dl or below: B. If the result meets the hospital definition of a critical result, follow hospital policy. A. Give patient an 8 ounce Glucerna Shake, an 8 ounce Ensure, or 8 ounces of a Glucerna/Ensure equivalent dietary supplement*. B. Wait 30 minutes. If result is 71 mg/dl to 130 mg/dl: C. Retest patients capillary blood glucose (CBG). D. If result greater than or equal to 110 mg/dl, proceed with HBO. If result less than 110 mg/dl, notify HBO physician and consider holding HBO. If result is 131 mg/dl to 249 mg/dl: A. Proceed with HBO. A. Notify HBO physician and await physician orders. B. It is recommended to hold HBO and do If result is 250 mg/dl or greater: blood/urine ketone testing. C. If the result meets the hospital definition of a critical result, follow hospital policy. POST-HBO GLYCEMIA INTERVENTIONS ACTION INTERVENTION Obtain post HBO capillary blood glucose (ensure 1 physician order is in chart). A. Notify HBO physician and await physician orders. 2 If result is 70 mg/dl or  below: B. If the result meets the hospital definition of a critical result, follow hospital policy. A. Give patient an 8 ounce Glucerna Shake, an 8 ounce Ensure, or 8 ounces of a Glucerna/Ensure equivalent dietary supplement*. B. Wait 15 minutes for symptoms of If result is 71 mg/dl to 100 mg/dl: hypoglycemia (i.e. nervousness, anxiety, sweating, chills, clamminess, irritability, confusion, tachycardia or dizziness). C. If patient asymptomatic, discharge patient. If patient symptomatic, repeat capillary blood glucose (CBG) and notify HBO physician. If result is 101 mg/dl to 249 mg/dl: A. Discharge patient. A. Notify HBO physician and await physician orders. B. It is recommended to do blood/urine ketone If result is 250 mg/dl or greater: testing. C. If the result meets the hospital definition of a critical result, follow hospital policy. *Juice or candies are NOT equivalent products. If patient refuses the Glucerna or Ensure, please consult the hospital dietitian for an appropriate substitute. Electronic Signature(s) Signed: 03/03/2022 4:41:53 PM By: [name] PA-C Signed: 03/04/2022 4:09:44 PM By: Gretta Cool, BSN, RN, CWS, Kim RN, BSN Entered By: Gretta Cool, BSN, RN, CWS, Kim on 03/03/2022 99:83:38 Raymond [name] ([id number]) [id number] Page 7 of 11 -------------------------------------------------------------------------------- Problem List Details Patient Name: Date of Service: Raymond [name] 03/03/2022 8:45 A M Medical Record Number: [id number] Patient Account Number: [id number] Date of Birth/Sex: Treating RN: 1965-08-18 (57 y.o. [name] Primary Care Provider: [name] Other Clinician: Referring Provider: Treating Provider/Extender:  Stone, Mariam Dollar, Elmyra Ricks Weeks in Treatment: 8 Active Problems ICD-10 Encounter Code Description Active Date MDM Diagnosis [phone] Chronic multifocal osteomyelitis, left ankle and foot  02/10/2022 No Yes E11.621 Type 2 diabetes mellitus with foot ulcer 01/03/2022 No Yes L97.522 Non-pressure chronic ulcer of other part of left foot with fat layer exposed 01/03/2022 No Yes I50.42 Chronic combined systolic (congestive) and diastolic (congestive) heart failure 01/03/2022 No Yes J44.9 Chronic obstructive pulmonary disease, unspecified 01/03/2022 No Yes Inactive Problems Resolved Problems Electronic Signature(s) Signed: 03/03/2022 8:51:26 AM By: [name] PA-C Entered By: [name] on 03/03/2022 08:51:26 -------------------------------------------------------------------------------- Progress Note Details Patient Name: Date of Service: Raymond Eke D. 03/03/2022 8:45 A M Medical Record Number: [id number] Patient Account Number: [id number] VITTORIO, MOHS ([id number]) [phone].pdf Page 8 of 11 Date of Birth/Sex: Treating RN: 19-Dec-1965 (57 y.o. [name] Primary Care Provider: [name] Other Clinician: Referring Provider: Treating Provider/Extender: Hazle Coca in Treatment: 8 Subjective Chief Complaint Information obtained from Patient Left foot ulcer with chronic osteomyelitis History of Present Illness (HPI) 09/05/17-He is seeing an initial evaluation for a left plantar foot ulcer. He has a remote history of left great toe amputation. He states that 4-6 weeks ago he noted callus formation and ulceration. He has not seen primary care regarding this. He is not currently on antibiotic therapy. He does not routinely follow with podiatry. He states diabetic foot wear will arrive early next week. The EHR shows an A1c of 9% approximate 4 months ago but he states he had one and primary care a few weeks ago but does not know the results. He is neuropathic and does not complain of any pain, he is currently wearing crocs. 09/12/17-he is here in follow up evaluation for left plantar foot ulcer. There is  improvement in both appearance and measurement. We will continue with same treatment plan and he will follow up next week. 09/19/17 on evaluation today patient actually appears to be doing excellent in regard to his ulcer on the invitation site of his left great foot plantar aspect. He's been tolerating the dressing changes without complication. In fact with the Prisma and the current measures he has been shown signs of excellent improvement week by week up to this point. We have been to breeding the wound in this seems to have been of great benefit for him. Fortunately there is no evidence of infection. 09/26/17 on evaluation today patient appears to be doing rather well in regard to his wound. He did not know quite as much improvement this week as compared to last week. Nonetheless he still continues to show signs of improving to some degree. I do believe he may benefit from an offloading shoe. No fevers, chills, nausea, or vomiting noted at this time. 10/03/17 on evaluation today patient actually appears to be doing much better in regard to the amputation site plantar foot ulcer. Overall this appears significantly smaller even compared to previous. He's been tolerating the dressing changes without complication. He did get his diabetic shoes and I did have a look at them they appear to be fairly good. Nonetheless I do think that for the time being I would probably recommend he continue with the offloading shoe that we have been utilizing just due to the fact that with the dressing I don't know that his diabetic shoes are going to work as appropriately as far as not called an additional pressure and irritation to the area in question. He understands.  10/10/17 on evaluation today patient actually appears to be doing very well in regard to his plantar foot ulcer. He has been tolerating the dressing changes without complication. I do feel like he's making signs of good improvement and in fact of the wound  bed appears to be better although it may not be significantly changed in size it appears healthier and I do believe is showing signs of improving. Nonetheless we gonna keep working towards healing as far as that is concerned. There is no evidence of infection. 10/17/17 on evaluation today patient actually appears to be doing poorly in regard to his plantar foot ulcer. Unfortunately other than just the area where the wound is itself there appears to be a blister that communicates with the wound unfortunately. This is more lateral to the wound itself and also to the distal point of the amputation site. There does not appear to be any evidence of cellulitis spreading at the foot but I do believe some of the drainage from the site itself is. When in nature. Nonetheless this blister area I think needs to be removed in order to allow for appropriate healing of the ulcer itself I think that is gonna be difficult for it to heal otherwise. This was discussed with the patient today. 10/24/17 on evaluation today patient actually appears to be doing better compared to last week even post debridement. He has shown signs of improvement he did test positive for Staphylococcus aureus. With that being said he notes that he's not have any discomfort and in general he does feel like things seem to been doing better. Fortunately there's no additional blistering and no evidence of remaining infection at this time. No fevers, chills, nausea, or vomiting noted at this time. He has three days of antibiotic left. 10/31/17 on evaluation today patient actually appears to be doing very well in regard to his ulcer on the left foot. He has been tolerating the dressing changes without complication. With that being said fortunately there appears to be no evidence of infection I do think he's made progress compared to previous. No fevers, chills, nausea, or vomiting noted at this time. 11/14/17 on evaluation today patient actually  appears to be doing excellent in regard to his amputation site ulcer on his foot. In fact this appears to be completely healed at this point. There is no evidence of infection nor underline abscess and I did thoroughly evaluate the periwound location. Overall I'm very pleased with how things appear. Readmission: 01-03-2022 upon evaluation today patient appears for reevaluation here in the clinic although it has been since 2019 that I last saw him. This again has been over 4 years. With that being said he is having an issue with a wound on his foot unfortunately. He has not had any recent x-rays he tells me at this point which is unfortunate as avoid definitely can need to get something started that can be one of the primary items to get moving forward with. With that being said I also think were probably can obtain a wound culture he is probably going to require some antibiotics at some point. I just want to make sure that we have him on the right thing when we do this. Currently the patient tells me that his wounds have been present in regard to the met head for about a month at this point. With that being said he also has a wound on the third toe which is the next toe this actually still present.  This unfortunately has a lot of callus buildup around it but I think it is larger underneath than what it would appear on initial inspection. Patient does have a history of diabetes mellitus type 2, congestive heart failure, and COPD. 01-11-2022 upon evaluation patient's wounds actually are showing signs of doing about the same may be just slightly cleaner but definitely not where we want things to be as of yet. Fortunately there does not appear to be any signs of active infection locally nor systemically at this point which is great news. No fevers, chills, nausea, vomiting, or diarrhea. Of note patient has had his MRI but we do not have the results of that as of yet we are still waiting for the reading on  this at this point. 01-18-2022 upon evaluation today patient presents after having had his MRI finally complete. 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 The third and fourth digits seem to be early which is good news. Nonetheless we are still going to need to try to see about getting the infection under control he is already been on the antibiotic therapy which I think has done quite well. In fact I feel like he is showing signs of improvement already. 12/28; patient with a wound on the medial aspect of the left first metatarsal head and an area on the left third toe with slight hammer deformity. He has had a previous left first toe amputation. We are using Iodoflex on the wound. He is a diabetic a recent MRI showed osteomyelitis we have been giving him Cipro and Doxy which he appears to be tolerating well. 02-10-2022 upon evaluation today patient unfortunately has issues still with open wounds of his foot on the left. He does have known osteomyelitis at this point and that is something that we have been treating with oral antibiotics. I actually have not seen him personally since 19 December. In that time he has not had any debridement from that point until today based on what I can see as best I can tell anyway. Has been on Cipro as well as doxycycline. Nonetheless he does still seem to have open wounds today and I think that he would benefit from hyperbaric oxygen therapy. I discussed that with him today as well. LEOR, WHYTE ([id number]) [id number] Page 9 of 11 02-18-2022 upon evaluation today patient appears to be doing about the same in regard to his wounds. Fortunately I do not see any signs of infection although he still has areas that seem to initially close down but that he has a lot of callus that still open at both locations most on the metatarsal region as well as the toe. I think  that he is appropriate for proceeding with the hyperbarics and I think the sooner we get this done to try to get these wounds healed the better off he will be. 02-25-2022 upon evaluation today patient's wounds do show signs of being dry get again. We were attempting to pack and some of the alginate dressing instead of doing the collagen as everything was getting very dry but nonetheless this is still continue to be an ongoing issue. My suggestion based on what I am seeing is good to be that we actually switch to doing Xeroform which should hopefully keep this a little bit more moist and from hopefully closing up prematurely. The patient voiced understanding. 03-03-2022 upon evaluation today patient appears to be doing well with  regard to his wounds things as before appear to be getting dry. We are using Xeroform to try to keep it open is much as possible but each time I see him he does tend to cover over the good news is each time he also seems to be getting a little bit smaller which is excellent. Fortunately I do not see any evidence of active infection locally nor systemically which is great news. I think this means that between the antibiotics and the hyperbarics were really on a good track here. Objective Constitutional Obese and well-hydrated in no acute distress. Vitals Time Taken: 8:47 AM, Height: 74 in, Weight: 371 lbs, BMI: 47.6, Temperature: 98.2 F, Pulse: 78 bpm, Respiratory Rate: 18 breaths/min, Blood Pressure: 113/68 mmHg. Respiratory normal breathing without difficulty. Psychiatric this patient is able to make decisions and demonstrates good insight into disease process. Alert and Oriented x 3. pleasant and cooperative. General Notes: Upon inspection patient's wounds again are showing signs of being dried and somewhat callused over. For that reason I did go ahead and recommend that we see about clearing away this today by debridement and he tolerated that without complication. Once  debrided both areas did show wounds underneath which we measured out. Nonetheless we will get a continue to monitor for any signs of worsening ongoing and hopefully he will continue towards complete and total resolution. Integumentary (Hair, Skin) Wound #2 status is Open. Original cause of wound was Gradually Appeared. The date acquired was: 12/13/2021. The wound has been in treatment 8 weeks. The wound is located on the Left,Medial Metatarsal head first. The wound measures 0.6cm length x 0.1cm width x 0.4cm depth; 0.047cm^2 area and 0.019cm^3 volume. There is Fat Layer (Subcutaneous Tissue) exposed. There is no tunneling or undermining noted. There is a large amount of serous drainage noted. The wound margin is flat and intact. There is large (67-100%) granulation within the wound bed. There is a small (1-33%) amount of necrotic tissue within the wound bed including Adherent Slough. General Notes: callus removed Wound #3 status is Open. Original cause of wound was Footwear Injury. The date acquired was: 01/03/2022. The wound has been in treatment 8 weeks. The wound is located on the Left T Third. The wound measures 0.1cm length x 0.3cm width x 0.2cm depth; 0.024cm^2 area and 0.005cm^3 volume. There is Fat oe Layer (Subcutaneous Tissue) exposed. There is no tunneling or undermining noted. There is a medium amount of serous drainage noted. The wound margin is flat and intact. There is large (67-100%) granulation within the wound bed. There is a small (1-33%) amount of necrotic tissue within the wound bed. 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 There was a Excisional Skin/Subcutaneous Tissue Debridement with a total area of 0.06 sq cm performed by Tommie Sams., PA-C. With the following instrument(s): Curette to remove Viable and Non-Viable tissue/material. Material removed includes Callus and Subcutaneous Tissue and. No specimens were taken. A time out was conducted at 09:12, prior to the start of the procedure. A Minimum amount of bleeding was controlled with Pressure. The procedure  was tolerated well. Post Debridement Measurements: 0.6cm length x 0.1cm width x 0.4cm depth; 0.019cm^3 volume. Character of Wound/Ulcer Post Debridement is stable. Severity of Tissue Post Debridement is: Fat layer exposed. Post procedure Diagnosis Wound #2: Same as Pre-Procedure ORIN, EBERWEIN ([id number]) [id number] Page 10 of 11 Wound #3 Pre-procedure diagnosis of Wound #3 is a Diabetic Wound/Ulcer of the Lower Extremity located on the Left T Third .Severity of Tissue Pre Debridement is: oe Fat layer exposed. There was a Excisional Skin/Subcutaneous Tissue Debridement with a total area of 0.03 sq cm performed by Tommie Sams., PA-C. With the following instrument(s): Curette to remove Viable and Non-Viable tissue/material. Material removed includes Callus and Subcutaneous Tissue and. No specimens were taken. A time out was conducted at 09:12, prior to the start of the procedure. A Minimum amount of bleeding was controlled with Pressure. The procedure was tolerated well. Post Debridement Measurements: 0.1cm length x 0.3cm width x 0.2cm depth; 0.005cm^3 volume. Character of Wound/Ulcer Post Debridement is stable. Severity of Tissue Post Debridement is: Limited to breakdown of skin. Post procedure Diagnosis Wound #3: Same as Pre-Procedure Plan Follow-up  Appointments: Wound #3 Left T Third: oe Return Appointment in 1 week. Bathing/ Shower/ Hygiene: Wash wounds with antibacterial soap and water. May shower; gently cleanse wound with antibacterial soap, rinse and pat dry prior to dressing wounds Off-Loading: Open toe surgical shoe Hyperbaric Oxygen Therapy: Wound #2 Left,Medial Metatarsal head first: Indication and location: - Wagoner Grade 3 ulcers of the left foot and 3rd toe If appropriate for treatment, begin HBOT per protocol: 2.0 ATA for 90 Minutes without Air Breaks One treatment per day (delivered Monday through Friday unless otherwise specified in Special Instructions below): T # of Treatments: - 40 otal Antihistamine 30 minutes prior to HBO Treatment, difficulty clearing ears. Finger stick Blood Glucose Pre- and Post- HBOT Treatment. Follow Hyperbaric Oxygen Glycemia Protocol Wound #3 Left T Third: oe Evaluate for HBO Therapy Indication and location: - [place]er Grade 3 ulcers of the left foot and 3rd toe If appropriate for treatment, begin HBOT per protocol: 2.0 ATA for 90 Minutes without Air Breaks One treatment per day (delivered Monday through Friday unless otherwise specified in Special Instructions below): T # of Treatments: - 40 otal Antihistamine 30 minutes prior to HBO Treatment, difficulty clearing ears. Finger stick Blood Glucose Pre- and Post- HBOT Treatment. Follow Hyperbaric Oxygen Glycemia Protocol WOUND #2: - Metatarsal head first Wound Laterality: Left, Medial Cleanser: Byram Ancillary Kit - 15 Day Supply (Generic) 3 x Per Week/30 Days Discharge Instructions: Use supplies as instructed; Kit contains: (15) Saline Bullets; (15) 3x3 Gauze; 15 pr Gloves Cleanser: Soap and Water 3 x Per Week/30 Days Discharge Instructions: Gently cleanse wound with antibacterial soap, rinse and pat dry prior to dressing wounds Prim Dressing: Xeroform 4x4-HBD (in/in) 3 x Per Week/30 Days ary Discharge Instructions: Apply  Xeroform 4x4-HBD (in/in) as directed Secondary Dressing: Kerlix 4.5 x 4.1 (in/yd) 3 x Per Week/30 Days Discharge Instructions: Apply Kerlix 4.5 x 4.1 (in/yd) as instructed Secured With: Medipore T - 31M Medipore H Soft Cloth Surgical T ape ape, 2x2 (in/yd) (Generic) 3 x Per Week/30 Days WOUND #3: - T Third Wound Laterality: Left oe Cleanser: Byram Ancillary Kit - 15 Day Supply (Generic) 3 x Per Week/30 Days Discharge Instructions: Use supplies as instructed; Kit contains: (15) Saline Bullets; (15) 3x3 Gauze; 15 pr Gloves Cleanser: Soap and Water 3 x Per Week/30 Days Discharge Instructions: Gently cleanse wound with antibacterial soap, rinse and pat  dry prior to dressing wounds Prim Dressing: Xeroform 4x4-HBD (in/in) 3 x Per Week/30 Days ary Discharge Instructions: Apply Xeroform 4x4-HBD (in/in) as directed Secondary Dressing: Kerlix 4.5 x 4.1 (in/yd) 3 x Per Week/30 Days Discharge Instructions: Apply Kerlix 4.5 x 4.1 (in/yd) as instructed Secured With: Medipore T - 36M Medipore H Soft Cloth Surgical T ape ape, 2x2 (in/yd) (Generic) 3 x Per Week/30 Days 1. I am going to suggest that we have the patient continue to monitor for any signs of infection. Obviously right now he is on the antibiotics which seem to be doing a great job and I am to have him continue with those probably even beyond when were done with hyperbarics the way he is moving at this point. Will see how things continue to proceed however. 2. I am also going to recommend that we have the patient continue to elevate his legs much as possible to help with edema control were using Xeroform for the dressing of choice try to keep this from drying out as much as we can. We will see patient back for reevaluation in 1 week here in the clinic. If anything worsens or changes patient will contact our office for additional recommendations. Electronic Signature(s) Signed: 03/03/2022 9:43:00 AM By: [name] PA-C Entered By: [name] on 03/03/2022 09:43:00 Raymond [name] ([id number]) [id number] Page 11 of 11 -------------------------------------------------------------------------------- SuperBill Details Patient Name: Date of Service: Raymond [name] 03/03/2022 Medical Record Number: [id number] Patient Account Number: [id number] Date of Birth/Sex: Treating RN: 1965/07/30 (57 y.o. Raymond [name] Primary Care Provider: [name] Other Clinician: Referring Provider: Treating Provider/Extender: [name] Weeks in Treatment: 8 Diagnosis Coding ICD-10 Codes Code Description [phone] Chronic multifocal osteomyelitis, left ankle and foot E11.621 Type 2 diabetes mellitus with foot ulcer L97.522 Non-pressure chronic ulcer of other part of left foot with fat layer exposed I50.42 Chronic combined systolic (congestive) and diastolic (congestive) heart failure J44.9 Chronic obstructive pulmonary disease, unspecified Facility Procedures : CPT4 Code: [id number] 1 Description: 3875 - DEB SUBQ TISSUE 20 SQ CM/< ICD-10 Diagnosis Description L97.522 Non-pressure chronic ulcer of other part of left foot with fat layer exposed Modifier: Quantity: 1 Physician Procedures : CPT4 Code Description Modifier [id number] - WC PHYS SUBQ TISS 20 SQ CM ICD-10 Diagnosis Description L97.522 Non-pressure chronic ulcer of other part of left foot with fat layer exposed Quantity: 1 Electronic Signature(s) Signed: 03/03/2022 9:43:23 AM By: [name] PA-C Entered By: [name] on 03/03/2022 09:43:23

## 2022-03-03 NOTE — Progress Notes (Signed)
DEROLD, DORSCH ([id number]) [id number] Page 1 of 2 Visit Report for 03/03/2022 Arrival Information Details Patient Name: Date of Service: [name]. 03/03/2022 10:00 A M Medical Record Number: [id number] Patient Account Number: [id number] Date of Birth/Sex: Treating RN: 10/21/1965 (57 y.o. [name], [name] Primary Care Joannah Gitlin: [name] Other Clinician: [name] Referring Kemonte Ullman: Treating Timotheus Salm/Extender: Hazle Coca in Treatment: 8 Visit Information History Since Last Visit Added or deleted any medications: No Patient Arrived: Ambulatory Any new allergies or adverse reactions: No Arrival Time: 09:25 Had a fall or experienced change in No Accompanied By: self activities of daily living that may affect Transfer Assistance: None risk of falls: Patient Identification Verified: Yes Signs or symptoms of abuse/neglect since No Secondary Verification Process Completed: Yes last visito Patient Requires Transmission-Based Precautions: No Hospitalized since last visit: No Patient Has Alerts: Yes Implantable device outside of the clinic No Patient Alerts: Type II Diabetic excluding Aspirin '81mg'$  cellular tissue based products placed in the ABI L [place]>220 center since last visit: Has Dressing in Place as Prescribed: Yes Has Footwear/Offloading in Place as Yes Prescribed: Left: Surgical Shoe with Pressure Relief Insole Pain Present Now: No Electronic Signature(s) Signed: 03/03/2022 1:41:33 PM By: [name] RCP, RRT CHT , , Entered By: [name] on 03/03/2022 09:56:37 , -------------------------------------------------------------------------------- Encounter Discharge Information Details Patient Name: Date of Service: [name], [name] 03/03/2022 10:00 A M Medical Record Number: [id number] Patient Account Number: [id number] Date of Birth/Sex: Treating RN: [birth year] (58  y.o. [name], [name] Primary Care Amberle Lyter: [name] Other Clinician: [name] Referring Chanse Kagel: Treating Ellis Mehaffey/Extender: Hazle Coca in Treatment: 8 Encounter Discharge Information Items Discharge Condition: Stable Ambulatory Status: Ambulatory Discharge Destination: Home NENG, ALBEE ([id number]) [phone].pdf Page 2 of 2 Transportation: Other Accompanied By: transportation Schedule Follow-up Appointment: Yes Clinical Summary of Care: Notes Patient has an HBO treatment scheduled on 03/04/22 at 10:00 am. Electronic Signature(s) Signed: 03/03/2022 1:41:33 PM By: [name] RCP, RRT CHT , , Entered By: [name] on 03/03/2022 13:41:09 , -------------------------------------------------------------------------------- Vitals Details Patient Name: Date of Service: [name], [place] 03/03/2022 10:00 A M Medical Record Number: [id number] Patient Account Number: [id number] Date of Birth/Sex: Treating RN: [birth year] (57 y.o. [name], [name] Primary Care Novali Vollman: [name] Other Clinician: [name] Referring Jalicia Roszak: Treating Oscar Forman/Extender: [name] Weeks in Treatment: 8 Vital Signs Time Taken: 09:27 Temperature (F): 98.2 Height (in): 74 Pulse (bpm): 78 Weight (lbs): 371 Respiratory Rate (breaths/min): 18 Body Mass Index (BMI): 47.6 Blood Pressure (mmHg): 113/68 Capillary Blood Glucose (mg/dl): 167 Reference Range: 80 - 120 mg / dl Electronic Signature(s) Signed: 03/03/2022 1:41:33 PM By: [name] RCP, RRT CHT , , Entered By: [name] on 03/03/2022 09:59:30 ,

## 2022-03-04 ENCOUNTER — Ambulatory Visit: Payer: Medicare HMO | Admitting: Physician Assistant

## 2022-03-04 NOTE — Progress Notes (Signed)
HEYDEN, JABER ([id number]) [id number] Page 1 of 10 Visit Report for 03/03/2022 Arrival Information Details Patient Name: Date of Service: Raymond [name] 03/03/2022 8:45 A M Medical Record Number: [id number] Patient Account Number: [id number] Date of Birth/Sex: Treating RN: 1965-09-18 (57 y.o. Raymond [name] Primary Care Raymond [name]: Raymond [name] Other Clinician: Referring Raymond [name]: Treating Raymond [name]/Extender: Raymond [name] in Treatment: 8 Visit Information History Since Last Visit Added or deleted any medications: No Patient Arrived: Ambulatory Has Dressing in Place as Prescribed: Yes Arrival Time: 08:46 Has Footwear/Offloading in Place as Yes Accompanied By: self Prescribed: Transfer Assistance: None Left: Surgical Shoe with Pressure Relief Insole Patient Identification Verified: Yes Pain Present Now: No Secondary Verification Process Completed: Yes Patient Requires Transmission-Based Precautions: No Patient Has Alerts: Yes Patient Alerts: Type II Diabetic Aspirin '81mg'$  ABI L [place]>220 Electronic Signature(s) Signed: 03/04/2022 4:09:44 PM By: Raymond [name], BSN, RN, CWS, Kim RN, BSN Entered By: Raymond [name], BSN, RN, CWS, Raymond [name] on 03/03/2022 08:47:16 -------------------------------------------------------------------------------- Encounter Discharge Information Details Patient Name: Date of Service: Raymond Eke D. 03/03/2022 8:45 A M Medical Record Number: [id number] Patient Account Number: [id number] Date of Birth/Sex: Treating RN: 05/07/1965 (57 y.o. Raymond [name] Primary Care Raymond [name]: Raymond [name] Other Clinician: Referring Raymond [name]: Treating Raymond [name]/Extender: Raymond [name] in Treatment: 8 Encounter Discharge Information Items Post Procedure Vitals Discharge Condition: Stable Temperature (F): 98.2 Ambulatory Status: Ambulatory Pulse (bpm): 78 Discharge Destination: Home Respiratory Rate  (breaths/min): 16 Transportation: Private Auto Blood Pressure (mmHg): 113/68 Accompanied By: self Schedule Follow-up Appointment: Yes Clinical Summary of Care: [name], [name] ([id number]) [phone].pdf Page 2 of 10 Electronic Signature(s) Signed: 03/04/2022 4:09:44 PM By: Raymond [name], BSN, RN, CWS, Kim RN, BSN Entered By: Raymond [name], BSN, RN, CWS, Raymond [name] on 03/03/2022 09:29:36 -------------------------------------------------------------------------------- Lower Extremity Assessment Details Patient Name: Date of Service: Raymond [name], Conrad 03/03/2022 8:45 A M Medical Record Number: [id number] Patient Account Number: [id number] Date of Birth/Sex: Treating RN: 1966-01-03 (57 y.o. Raymond [name] Primary Care Kenyada Dosch: Raymond [name] Other Clinician: Referring Romir Klimowicz: Treating Raymond [name]/Extender: Raymond [name] in Treatment: 8 Edema Assessment Assessed: Raymond [name]: Yes] Raymond [name]: No] [Left: Edema] [Right: :] Vascular Assessment Pulses: Dorsalis Pedis Palpable: [Left:Yes] Electronic Signature(s) Signed: 03/04/2022 4:09:44 PM By: Raymond [name], BSN, RN, CWS, Kim RN, BSN Entered By: Raymond [name], BSN, RN, CWS, Raymond [name] on 03/03/2022 08:56:31 -------------------------------------------------------------------------------- Multi Wound Chart Details Patient Name: Date of Service: Raymond Eke D. 03/03/2022 8:45 A M Medical Record Number: [id number] Patient Account Number: [id number] Date of Birth/Sex: Treating RN: Jul 26, 1965 (57 y.o. Raymond [name] Primary Care Bellagrace Sylvan: Raymond [name] Other Clinician: Referring Ngina Royer: Treating Christiaan Strebeck/Extender: Raymond [name] in Treatment: 8 Vital Signs Height(in): 74 Pulse(bpm): 78 Weight(lbs): 729 Blood Pressure(mmHg): 113/68 Body Mass Index(BMI): 47.6 Temperature(F): 98.2 Respiratory Rate(breaths/min): [address] ([id number]) [2:Photos:] [N/A:[id number] Page  3 of 10 N/A] Left, Medial Metatarsal head first Left T Third oe N/A Wound Location: Gradually Appeared Footwear Injury N/A Wounding Event: Diabetic Wound/Ulcer of the Lower Diabetic Wound/Ulcer of the Lower N/A Primary Etiology: Extremity Extremity Congestive Heart Failure, Congestive Heart Failure, N/A Comorbid History: Hypertension, Type II Diabetes, Hypertension, Type II Diabetes, Neuropathy Neuropathy 12/13/2021 01/03/2022 N/A Date Acquired: 8 8 N/A [name] of Treatment: Open Open N/A Wound Status: No No N/A Wound Recurrence: 0.6x0.1x0.4 0.1x0.3x0.2 N/A Measurements L x W x D (cm) 0.047 0.024 N/A A (cm) : rea 0.019 0.005 N/A Volume (cm) : 96.30% 96.20% N/A % Reduction in A rea: 95.00% 96.10% N/A %  Reduction in Volume: Grade 3 Grade 3 N/A Classification: Large Medium N/A Exudate A mount: Serous Serous N/A Exudate Type: amber amber N/A Exudate Color: Flat and Intact Flat and Intact N/A Wound Margin: Large (67-100%) Large (67-100%) N/A Granulation A mount: Small (1-33%) Small (1-33%) N/A Necrotic A mount: Fat Layer (Subcutaneous Tissue): Yes Fat Layer (Subcutaneous Tissue): Yes N/A Exposed Structures: Fascia: No Fascia: No Tendon: No Tendon: No Muscle: No Muscle: No Joint: No Joint: No Bone: No Bone: No None None N/A Epithelialization: Debridement - Excisional Debridement - Excisional N/A Debridement: Pre-procedure Verification/Time Out 09:12 09:12 N/A Taken: Callus, Subcutaneous Callus, Subcutaneous N/A Tissue Debrided: Skin/Subcutaneous Tissue Skin/Subcutaneous Tissue N/A Level: 0.06 0.03 N/A Debridement A (sq cm): rea Curette Curette N/A Instrument: Minimum Minimum N/A Bleeding: Pressure Pressure N/A Hemostasis A chieved: Procedure was tolerated well Procedure was tolerated well N/A Debridement Treatment Response: 0.6x0.1x0.4 0.1x0.3x0.2 N/A Post Debridement Measurements L x W x D (cm) 0.019 0.005 N/A Post Debridement Volume:  (cm) callus removed N/A N/A Assessment Notes: Debridement N/A N/A Procedures Performed: Treatment Notes Electronic Signature(s) Signed: 03/04/2022 4:09:44 PM By: Raymond [name], BSN, RN, CWS, Kim RN, BSN Entered By: Raymond [name], BSN, RN, CWS, Raymond [name] on 03/03/2022 09:24:49 -------------------------------------------------------------------------------- [place] Details Patient Name: Date of Service: Raymond [name], [place] 03/03/2022 8:45 A M Medical Record Number: [id number] Patient Account Number: [id number] TION, TSE ([id number]) [id number] Page 4 of 10 Date of Birth/Sex: Treating RN: 1965/08/30 (57 y.o. Raymond [name], Raymond [name] Primary Care Uma Jerde: Raymond [name] Other Clinician: Referring Jo-Ann Johanning: Treating Attila Mccarthy/Extender: Raymond [name] in Treatment: 8 Active Inactive Medication Nursing Diagnoses: Knowledge deficit related to medication safety: actual or potential Goals: Patient/caregiver will demonstrate understanding of all current medications Date Initiated: 01/03/2022 T arget Resolution Date: 03/11/2022 Goal Status: Active Interventions: Assess for medication contraindications each visit where new medications are prescribed Assess patient/caregiver ability to manage medication regimen upon admission and as needed Patient/Caregiver given reconciled medication list upon admission, changes in medications and discharge from the [facility] Provide education on medication safety Notes: Necrotic Tissue Nursing Diagnoses: Impaired tissue integrity related to necrotic/devitalized tissue Knowledge deficit related to management of necrotic/devitalized tissue Goals: Necrotic/devitalized tissue will be minimized in the wound bed Date Initiated: 01/03/2022 Target Resolution Date: 03/03/2022 Goal Status: Active Patient/caregiver will verbalize understanding of reason and process for debridement of necrotic tissue Date Initiated:  01/03/2022 Target Resolution Date: 03/03/2022 Goal Status: Active Interventions: Assess patient pain level pre-, during and post procedure and prior to discharge Provide education on necrotic tissue and debridement process Treatment Activities: Excisional debridement : 01/03/2022 Notes: Pressure Nursing Diagnoses: Knowledge deficit related to causes and risk factors for pressure ulcer development Knowledge deficit related to management of pressures ulcers Potential for impaired tissue integrity related to pressure, friction, moisture, and shear Goals: Patient will remain free from development of additional pressure ulcers Date Initiated: 01/03/2022 Target Resolution Date: 03/03/2022 Goal Status: Active Patient/caregiver will verbalize risk factors for pressure ulcer development Date Initiated: 01/03/2022 Target Resolution Date: 03/03/2022 Goal Status: Active Patient/caregiver will verbalize understanding of pressure ulcer management Date Initiated: 01/03/2022 Target Resolution Date: 03/03/2022 Goal Status: Active Interventions: Assess potential for pressure ulcer upon admission and as needed Notes: Soft Tissue Infection Nursing Diagnoses: Impaired tissue integrity KASHIS, PENLEY ([id number]) [id number].pdf Page 5 of 10 Knowledge deficit related to disease process and management Knowledge deficit related to home infection control: handwashing, handling of soiled dressings, supply storage Potential for infection: soft tissue Goals: Patient will remain free of wound  infection Date Initiated: 01/03/2022 Target Resolution Date: 03/03/2022 Goal Status: Active Patient/caregiver will verbalize understanding of or measures to prevent infection and contamination in the home setting Date Initiated: 01/03/2022 Target Resolution Date: 03/03/2022 Goal Status: Active Patient's soft tissue infection will resolve Date Initiated: 01/03/2022 Date Inactivated: 03/03/2022 Target  Resolution Date: 02/18/2022 Goal Status: Met Signs and symptoms of infection will be recognized early to allow for prompt treatment Date Initiated: 01/03/2022 Target Resolution Date: 03/03/2022 Goal Status: Active Interventions: Assess signs and symptoms of infection every visit Treatment Activities: Culture and sensitivity : 01/03/2022 Notes: Wound/Skin Impairment Nursing Diagnoses: Impaired tissue integrity Knowledge deficit related to ulceration/compromised skin integrity Goals: Ulcer/skin breakdown will have a volume reduction of 30% by week 4 Date Initiated: 02/18/2022 Target Resolution Date: 02/18/2022 Goal Status: Active Ulcer/skin breakdown will have a volume reduction of 50% by week 8 Date Initiated: 02/18/2022 Target Resolution Date: 03/18/2022 Goal Status: Active Interventions: Assess patient/caregiver ability to obtain necessary supplies Assess ulceration(s) every visit Treatment Activities: Consult for HBO : 02/18/2022 Referred to DME Annamay Laymon for dressing supplies : 02/18/2022 Skin care regimen initiated : 02/18/2022 Notes: Electronic Signature(s) Signed: 03/04/2022 4:09:44 PM By: Raymond [name], BSN, RN, CWS, Kim RN, BSN Entered By: Raymond [name], BSN, RN, CWS, Raymond [name] on 03/03/2022 63:87:56 -------------------------------------------------------------------------------- Pain Assessment Details Patient Name: Date of Service: Raymond Eke D. 03/03/2022 8:45 A M Medical Record Number: [id number] Patient Account Number: [id number] Date of Birth/Sex: Treating RN: [birth year] (57 y.o. Raymond [name] Primary Care Jena Tegeler: Raymond [name] Other Clinician: Referring Madelein Mahadeo: Treating Lancer Thurner/Extender: Raymond [name] in Treatment: [address] ([id number]) [id number] Page 6 of 10 Active Problems Location of Pain Severity and Description of Pain Patient Has Paino No Site Locations Pain Management and Medication Current Pain  Management: Notes Patient denies pain at this time. Electronic Signature(s) Signed: 03/04/2022 4:09:44 PM By: Raymond [name], BSN, RN, CWS, Kim RN, BSN Entered By: Raymond [name], BSN, RN, CWS, Raymond [name] on 03/03/2022 08:49:44 -------------------------------------------------------------------------------- Patient/Caregiver Education Details Patient Name: Date of Service: Raymond [name] 2/1/2024andnbsp8:45 A M Medical Record Number: [id number] Patient Account Number: [id number] Date of Birth/Gender: Treating RN: Oct 03, 1965 (57 y.o. Raymond [name] Primary Care Physician: Raymond [name] Other Clinician: Referring Physician: Treating Physician/Extender: Raymond [name] in Treatment: 8 Education Assessment Education Provided To: Patient Education Topics Provided Wound Debridement: Handouts: Wound Debridement Methods: Demonstration, Explain/Verbal Responses: State content correctly Wound/Skin Impairment: Handouts: Caring for Your Ulcer Methods: Demonstration, Explain/Verbal Responses: State content correctly Raymond [name] ([id number]) [id number] Page 7 of 10 Electronic Signature(s) Signed: 03/04/2022 4:09:44 PM By: Raymond [name], BSN, RN, CWS, Kim RN, BSN Entered By: Raymond [name], BSN, RN, CWS, Raymond [name] on 03/03/2022 09:27:03 -------------------------------------------------------------------------------- Wound Assessment Details Patient Name: Date of Service: Raymond Eke D. 03/03/2022 8:45 A M Medical Record Number: [id number] Patient Account Number: [id number] Date of Birth/Sex: Treating RN: [birth year] (57 y.o. Raymond [name], Raymond [name] Primary Care Niguel Moure: Raymond [name] Other Clinician: Referring Marieke Lubke: Treating Jhase Creppel/Extender: Raymond [name] in Treatment: 8 Wound Status Wound Number: 2 Primary Diabetic Wound/Ulcer of the Lower Extremity Etiology: Wound Location: Left, Medial Metatarsal head first Wound Status: Open Wounding  Event: Gradually Appeared Comorbid Congestive Heart Failure, Hypertension, Type II Diabetes, Date Acquired: 12/13/2021 History: Neuropathy [name] Of Treatment: 8 Clustered Wound: No Photos Wound Measurements Length: (cm) 0.6 Width: (cm) 0.1 Depth: (cm) 0.4 Area: (cm) 0.047 Volume: (cm) 0.019 % Reduction in Area: 96.3% % Reduction in Volume: 95% Epithelialization: None Tunneling: No Undermining: No Wound Description Classification:  Grade 3 Wound Margin: Flat and Intact Exudate Amount: Large Exudate Type: Serous Exudate Color: amber Foul Odor After Cleansing: No Slough/Fibrino Yes Wound Bed Granulation Amount: Large (67-100%) Exposed Structure Necrotic Amount: Small (1-33%) Fascia Exposed: No Necrotic Quality: Adherent Slough Fat Layer (Subcutaneous Tissue) Exposed: Yes Tendon Exposed: No Muscle Exposed: No Joint Exposed: No Bone Exposed: No Raymond [name], Raymond [name] ([id number]) [id number] Page 8 of 10 Assessment Notes callus removed Treatment Notes Wound #2 (Metatarsal head first) Wound Laterality: Left, Medial Cleanser Byram Ancillary Kit - 15 Day Supply Discharge Instruction: Use supplies as instructed; Kit contains: (15) Saline Bullets; (15) 3x3 Gauze; 15 pr Gloves Soap and Water Discharge Instruction: Gently cleanse wound with antibacterial soap, rinse and pat dry prior to dressing wounds Peri-Wound Care Topical Primary Dressing Xeroform 4x4-HBD (in/in) Discharge Instruction: Apply Xeroform 4x4-HBD (in/in) as directed Secondary Dressing Kerlix 4.5 x 4.1 (in/yd) Discharge Instruction: Apply Kerlix 4.5 x 4.1 (in/yd) as instructed Secured With Medipore T - 443M Medipore H Soft Cloth Surgical T ape ape, 2x2 (in/yd) Compression Wrap Compression Stockings Environmental education officer) Signed: 03/04/2022 4:09:44 PM By: Raymond [name], BSN, RN, CWS, Kim RN, BSN Entered By: Raymond [name], BSN, RN, CWS, Raymond [name] on 03/03/2022  09:16:06 -------------------------------------------------------------------------------- Wound Assessment Details Patient Name: Date of Service: Raymond Eke D. 03/03/2022 8:45 A M Medical Record Number: [id number] Patient Account Number: [id number] Date of Birth/Sex: Treating RN: March 21, 1965 (57 y.o. Raymond [name] Primary Care Grettell Ransdell: Raymond [name] Other Clinician: Referring Kiev Labrosse: Treating Rakim Moone/Extender: Raymond [name] in Treatment: 8 Wound Status Wound Number: 3 Primary Diabetic Wound/Ulcer of the Lower Extremity Etiology: Wound Location: Left T Third oe Wound Status: Open Wounding Event: Footwear Injury Comorbid Congestive Heart Failure, Hypertension, Type II Diabetes, Date Acquired: 01/03/2022 History: Neuropathy [name] Of Treatment: 8 Clustered Wound: No Photos Raymond [name], Raymond [name] ([id number]) [id number] Page 9 of 10 Wound Measurements Length: (cm) 0.1 Width: (cm) 0.3 Depth: (cm) 0.2 Area: (cm) 0.024 Volume: (cm) 0.005 % Reduction in Area: 96.2% % Reduction in Volume: 96.1% Epithelialization: None Tunneling: No Undermining: No Wound Description Classification: Grade 3 Wound Margin: Flat and Intact Exudate Amount: Medium Exudate Type: Serous Exudate Color: amber Foul Odor After Cleansing: No Slough/Fibrino Yes Wound Bed Granulation Amount: Large (67-100%) Exposed Structure Necrotic Amount: Small (1-33%) Fascia Exposed: No Fat Layer (Subcutaneous Tissue) Exposed: Yes Tendon Exposed: No Muscle Exposed: No Joint Exposed: No Bone Exposed: No Treatment Notes Wound #3 (Toe Third) Wound Laterality: Left Cleanser Byram Ancillary Kit - 15 Day Supply Discharge Instruction: Use supplies as instructed; Kit contains: (15) Saline Bullets; (15) 3x3 Gauze; 15 pr Gloves Soap and Water Discharge Instruction: Gently cleanse wound with antibacterial soap, rinse and pat dry prior to dressing  wounds Peri-Wound Care Topical Primary Dressing Xeroform 4x4-HBD (in/in) Discharge Instruction: Apply Xeroform 4x4-HBD (in/in) as directed Secondary Dressing Kerlix 4.5 x 4.1 (in/yd) Discharge Instruction: Apply Kerlix 4.5 x 4.1 (in/yd) as instructed Secured With Medipore T - 443M Medipore H Soft Cloth Surgical T ape ape, 2x2 (in/yd) Compression Wrap Compression Stockings Environmental education officer) Signed: 03/04/2022 4:09:44 PM By: Raymond [name], BSN, RN, CWS, Kim RN, BSN Entered By: Raymond [name], BSN, RN, CWS, Raymond [name] on 03/03/2022 09:15:44 Raymond [name] ([id number]) [id number].pdf Page 10 of 10 -------------------------------------------------------------------------------- Vitals Details Patient Name: Date of Service: Raymond [name] 03/03/2022 8:45 A M Medical Record Number: [id number] Patient Account Number: [id number] Date of Birth/Sex: Treating RN: 10/31/1965 (57 y.o. Raymond [name] Primary Care Fontella Shan: Raymond [name] Other Clinician: Referring Shaquela Weichert: Treating Farren Landa/Extender: Joaquim Lai,  Mariam Dollar, Elmyra Ricks [name] in Treatment: 8 Vital Signs Time Taken: 08:47 Temperature (F): 98.2 Height (in): 74 Pulse (bpm): 78 Weight (lbs): 371 Respiratory Rate (breaths/min): 18 Body Mass Index (BMI): 47.6 Blood Pressure (mmHg): 113/68 Reference Range: 80 - 120 mg / dl Electronic Signature(s) Signed: 03/04/2022 4:09:44 PM By: Raymond [name], BSN, RN, CWS, Kim RN, BSN Entered By: Raymond [name], BSN, RN, CWS, Raymond [name] on 03/03/2022 08:49:30

## 2022-03-07 ENCOUNTER — Encounter: Payer: Medicare HMO | Admitting: Physician Assistant

## 2022-03-07 DIAGNOSIS — E11621 Type 2 diabetes mellitus with foot ulcer: Secondary | ICD-10-CM | POA: Diagnosis not present

## 2022-03-07 LAB — GLUCOSE, CAPILLARY
Glucose-Capillary: 281 mg/dL — ABNORMAL HIGH (ref 70–99)
Glucose-Capillary: 261 mg/dL — ABNORMAL HIGH (ref 70–99)

## 2022-03-07 NOTE — Progress Notes (Signed)
[name], [name] ([id number]) [id number] Page 1 of 2 Visit Report for 03/07/2022 Arrival Information Details Patient Name: Date of Service: Raymond [name]. 03/07/2022 8:00 A M Medical Record Number: [id number] Patient Account Number: [id number] Date of Birth/Sex: Treating RN: 1965-03-20 (57 y.o. Raymond [name], Raymond [name] Primary Care Raymond [name]: Raymond [name] Other Clinician: Jacqulyn [name] Referring Raymond [name]: Treating Raymond [name]/Extender: Raymond [name] in Treatment: 9 Visit Information History Since Last Visit Added or deleted any medications: No Patient Arrived: Ambulatory Any new allergies or adverse reactions: No Arrival Time: 07:50 Had a fall or experienced change in No Accompanied By: self activities of daily living that may affect Transfer Assistance: None risk of falls: Patient Identification Verified: Yes Signs or symptoms of abuse/neglect since No Secondary Verification Process Completed: Yes last visito Patient Requires Transmission-Based Precautions: No Hospitalized since last visit: No Patient Has Alerts: Yes Implantable device outside of the clinic No Patient Alerts: Type II Diabetic excluding Aspirin '81mg'$  cellular tissue based products placed in the ABI L >220 center since last visit: Has Dressing in Place as Prescribed: Yes Has Footwear/Offloading in Place as Yes Prescribed: Left: Surgical Shoe with Pressure Relief Insole Pain Present Now: No Electronic Signature(s) Signed: 03/07/2022 11:35:21 AM By: Raymond [name] RCP, RRT CHT , , Entered By: Raymond [name] on 03/07/2022 09:44:36 , -------------------------------------------------------------------------------- Encounter Discharge Information Details Patient Name: Date of Service: Raymond [name], Raymond Negus D. 03/07/2022 8:00 A M Medical Record Number: [id number] Patient Account Number: [id number] Date of Birth/Sex: Treating RN: 01-16-1966 (57  y.o. Raymond [name], Raymond [name] Primary Care Raymond [name]: Raymond [name] Other Clinician: Jacqulyn [name] Referring Daylen Hack: Treating Raymond [name]/Extender: Raymond [name] in Treatment: 9 Encounter Discharge Information Items Discharge Condition: Stable Ambulatory Status: Ambulatory Discharge Destination: Home Raymond [name] ([id number]) [id number] Page 2 of 2 Transportation: Other Accompanied By: transportation Schedule Follow-up Appointment: Yes Clinical Summary of Care: Notes Patient has an HBO treatment scheduled on 03/08/22 at 08:00 am. Electronic Signature(s) Signed: 03/07/2022 11:35:21 AM By: Raymond [name] RCP, RRT CHT , , Entered By: Raymond [name] on 03/07/2022 11:34:54 , -------------------------------------------------------------------------------- Vitals Details Patient Name: Date of Service: Raymond [name], Raymond Negus D. 03/07/2022 8:00 A M Medical Record Number: [id number] Patient Account Number: [id number] Date of Birth/Sex: Treating RN: [birth year] (57 y.o. Raymond [name], Raymond [name] Primary Care Raymond [name]: Raymond [name] Other Clinician: Jacqulyn [name] Referring Belen Pesch: Treating Raymond [name]/Extender: Raymond [name] Weeks in Treatment: 9 Vital Signs Time Taken: 07:52 Temperature (F): 98.4 Height (in): 74 Pulse (bpm): 72 Weight (lbs): 371 Respiratory Rate (breaths/min): 18 Body Mass Index (BMI): 47.6 Blood Pressure (mmHg): 142/66 Capillary Blood Glucose (mg/dl): 281 Reference Range: 80 - 120 mg / dl Electronic Signature(s) Signed: 03/07/2022 11:35:21 AM By: Raymond [name] RCP, RRT CHT , , Entered By: Raymond [name] on 03/07/2022 09:44:47 ,

## 2022-03-08 ENCOUNTER — Encounter: Payer: Medicare HMO | Admitting: Physician Assistant

## 2022-03-08 DIAGNOSIS — E11621 Type 2 diabetes mellitus with foot ulcer: Secondary | ICD-10-CM | POA: Diagnosis not present

## 2022-03-08 LAB — GLUCOSE, CAPILLARY
Glucose-Capillary: 131 mg/dL — ABNORMAL HIGH (ref 70–99)
Glucose-Capillary: 130 mg/dL — ABNORMAL HIGH (ref 70–99)

## 2022-03-08 NOTE — Progress Notes (Signed)
BOHDAN, MACHO ([id number]) [id number] Page 1 of 2 Visit Report for 03/08/2022 Arrival Information Details Patient Name: Date of Service: Raymond [name]. 03/08/2022 8:00 A M Medical Record Number: [id number] Patient Account Number: [id number] Date of Birth/Sex: Treating RN: 1965/06/30 (57 y.o. [name], [name] Primary Care Kendon Sedeno: [name] Other Clinician: [name] Referring Moshe Wenger: Treating Katherin Ramey/Extender: Hazle Coca in Treatment: 9 Visit Information History Since Last Visit Added or deleted any medications: No Patient Arrived: Ambulatory Any new allergies or adverse reactions: No Arrival Time: 07:54 Had a fall or experienced change in No Accompanied By: self activities of daily living that may affect Transfer Assistance: None risk of falls: Patient Identification Verified: Yes Signs or symptoms of abuse/neglect since No Secondary Verification Process Completed: Yes last visito Patient Requires Transmission-Based Precautions: No Hospitalized since last visit: No Patient Has Alerts: Yes Implantable device outside of the clinic No Patient Alerts: Type II Diabetic excluding Aspirin '81mg'$  cellular tissue based products placed in the ABI L [place]>220 center since last visit: Has Dressing in Place as Prescribed: Yes Has Footwear/Offloading in Place as Yes Prescribed: Left: Surgical Shoe with Pressure Relief Insole Pain Present Now: No Electronic Signature(s) Signed: 03/08/2022 12:18:24 PM By: [name] RCP, RRT CHT , , Entered By: [name] on 03/08/2022 10:06:06 , -------------------------------------------------------------------------------- Encounter Discharge Information Details Patient Name: Date of Service: Raymond [name], Raymond Negus D. 03/08/2022 8:00 A M Medical Record Number: [id number] Patient Account Number: [id number] Date of Birth/Sex: Treating RN: 1965/03/16 (57  y.o. [name], [name] Primary Care Jalyiah Shelley: [name] Other Clinician: [name] Referring Jayvyn Haselton: Treating Sapir Lavey/Extender: Hazle Coca in Treatment: 9 Encounter Discharge Information Items Discharge Condition: Stable Ambulatory Status: Ambulatory Discharge Destination: Home DENT, PLANTZ ([id number]) [phone].pdf Page 2 of 2 Transportation: Other Accompanied By: transportation Schedule Follow-up Appointment: Yes Clinical Summary of Care: Notes Patient has an HBO treatment scheduled on 03/09/22 at 08:00 am. Electronic Signature(s) Signed: 03/08/2022 12:18:24 PM By: [name] RCP, RRT CHT , , Entered By: [name] on 03/08/2022 10:25:03 , -------------------------------------------------------------------------------- Vitals Details Patient Name: Date of Service: Raymond [name], Raymond Negus D. 03/08/2022 8:00 A M Medical Record Number: [id number] Patient Account Number: [id number] Date of Birth/Sex: Treating RN: 16-Jan-1966 (57 y.o. [name], [name] Primary Care Kainat Pizana: [name] Other Clinician: [name] Referring Chevonne Bostrom: Treating Mady Oubre/Extender: [name] Weeks in Treatment: 9 Vital Signs Time Taken: 07:54 Temperature (F): 97.7 Height (in): 74 Pulse (bpm): 72 Weight (lbs): 371 Respiratory Rate (breaths/min): 18 Body Mass Index (BMI): 47.6 Blood Pressure (mmHg): 130/64 Capillary Blood Glucose (mg/dl): 131 Reference Range: 80 - 120 mg / dl Electronic Signature(s) Signed: 03/08/2022 12:18:24 PM By: [name] RCP, RRT CHT , , Entered By: [name] on 03/08/2022 10:20:13 ,

## 2022-03-08 NOTE — Progress Notes (Signed)
CARLYLE, MCELRATH ([id number]) [id number] Page 1 of 2 Visit Report for 03/08/2022 HBO Details Patient Name: Date of Service: Raymond [name]. 03/08/2022 8:00 A M Medical Record Number: [id number] Patient Account Number: [id number] Date of Birth/Sex: Treating RN: 25-Jul-1965 (57 y.o. 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 HBO Treatment Details Treatment Number: 9 Patient Type: Outpatient Chamber Type: Monoplace Chamber Serial #: [id number] Treatment Protocol: 2.0 ATA with 90 minutes oxygen, and no air breaks Treatment Details Compression Rate Down: 1.5 psi / minute De-Compression Rate Up: 1.5 psi / minute Air breaks and breathing Decompress Decompress Compress Tx Pressure Begins Reached periods Begins Ends (leave unused spaces blank) Chamber Pressure (ATA 1 2 ------2 1 ) Clock Time (24 hr) 08:14 08:26 - - - - - - 09:56 10:06 Treatment Length: 112 (minutes) Treatment Segments: 4 Vital Signs Capillary Blood Glucose Reference Range: 80 - 120 mg / dl HBO Diabetic Blood Glucose Intervention Range: <131 mg/dl or >249 mg/dl Type: Time Vitals Blood Pulse: Respiratory Capillary Blood Glucose Pulse Action Temperature: Taken: Pressure: Rate: Glucose (mg/dl): Meter #: Oximetry (%) Taken: Pre 07:54 130/64 72 18 97.7 131 1 gave ensure per PA-C Post 10:11 128/60 60 18 98.3 130 1 none per protocol Treatment Response Treatment Toleration: Well Treatment Completion Status: Treatment Completed without Adverse Event Electronic Signature(s) Signed: 03/08/2022 12:18:24 PM By: [name] RCP, RRT CHT , , Signed: 03/08/2022 2:29:13 PM By: [name] PA-C Entered By: [name] on 03/08/2022 10:24:01 , [name] ([id number]) [id number] Page 2 of 2 -------------------------------------------------------------------------------- HBO Safety Checklist Details Patient Name: Date of Service: Raymond [name]. 03/08/2022 8:00 A M Medical Record Number: [id number] Patient Account Number: [id number] Date of Birth/Sex: Treating RN: 03/05/1965 (57 y.o. [name], [name] Primary Care Larnie Heart: [name] Other Clinician: [name] Referring Viyan Rosamond: Treating Aunesty Tyson/Extender: [name] Weeks in Treatment: 9 HBO Safety Checklist Items Safety Checklist Consent Form Signed Patient voided / foley secured and emptied When did you last eato 03/08/22 am Last dose of injectable or oral agent 03/08/22 am metformin Ostomy pouch emptied and vented if applicable NA All implantable devices assessed, documented and approved NA Intravenous access site secured and place NA Valuables secured Linens and cotton and cotton/polyester blend (less than 51% polyester) Personal oil-based products / skin lotions / body lotions removed Wigs or hairpieces removed NA Smoking or tobacco materials removed NA Books / newspapers / magazines / loose paper removed NA Cologne, aftershave, perfume and deodorant removed Jewelry removed (may wrap wedding band) Make-up removed NA Hair care products removed Battery operated devices (external) removed NA Heating patches and chemical warmers removed NA Titanium eyewear removed NA Nail polish cured greater than 10 hours NA Casting material cured greater than 10 hours NA Hearing aids removed NA Loose dentures or partials removed NA Prosthetics have been removed NA Patient demonstrates correct use of air break device (if applicable) Patient concerns have been  addressed Patient grounding bracelet on and cord attached to chamber Specifics for Inpatients (complete in addition to above) Medication sheet sent with patient Intravenous medications needed or due during therapy sent with patient Drainage tubes (e.g. nasogastric tube or chest tube secured and  vented) Endotracheal or Tracheotomy tube secured Cuff deflated of air and inflated with saline Airway suctioned Electronic Signature(s) Signed: 03/08/2022 12:18:24 PM By: [name] RCP, RRT CHT , , Entered By: [name] on 03/08/2022 10:21:34 ,

## 2022-03-08 NOTE — Progress Notes (Signed)
AARIV, MEDLOCK ([id number]) [id number] Page 1 of 2 Visit Report for 03/07/2022 HBO Details Patient Name: Date of Service: Raymond [name]. 03/07/2022 8:00 A M Medical Record Number: [id number] Patient Account Number: [id number] Date of Birth/Sex: Treating RN: Feb 05, 1965 (57 y.o. 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 HBO Treatment Details Treatment Number: 8 Patient Type: Outpatient Chamber Type: Monoplace Chamber Serial #: [id number] Treatment Protocol: 2.0 ATA with 90 minutes oxygen, and no air breaks Treatment Details Compression Rate Down: 1.5 psi / minute De-Compression Rate Up: 1.5 psi / minute Air breaks and breathing Decompress Decompress Compress Tx Pressure Begins Reached periods Begins Ends (leave unused spaces blank) Chamber Pressure (ATA 1 2 ------2 1 ) Clock Time (24 hr) 08:22 08:34 - - - - - - 10:04 10:14 Treatment Length: 112 (minutes) Treatment Segments: 4 Vital Signs Capillary Blood Glucose Reference Range: 80 - 120 mg / dl HBO Diabetic Blood Glucose Intervention Range: <131 mg/dl or >249 mg/dl Type: Time Vitals Blood Respiratory Capillary Blood Glucose Pulse Action Pulse: Temperature: Taken: Pressure: Rate: Glucose (mg/dl): Meter #: Oximetry (%) Taken: Pre 07:52 142/66 72 18 98.4 281 1 none per protocol Post 10:32 136/64 72 18 98 261 1 none per protocol Treatment Response Treatment Toleration: Well Treatment Completion Status: Treatment Completed without Adverse Event Electronic Signature(s) Signed: 03/07/2022 11:35:21 AM By: [name] RCP, RRT CHT , , Signed: 03/07/2022 6:11:00 PM By: [name] PA-C Entered By: [name] on 03/07/2022 11:33:29 , [name] ([id number]) [id number] Page 2 of 2 -------------------------------------------------------------------------------- HBO Safety Checklist Details Patient Name: Date of Service: Raymond [name]. 03/07/2022 8:00 A M Medical Record Number: [id number] Patient Account Number: [id number] Date of Birth/Sex: Treating RN: 1965-02-24 (57 y.o. [name], [name] Primary Care Maysen Bonsignore: [name] Other Clinician: [name] Referring Aloma Boch: Treating Starlena Beil/Extender: [name] Weeks in Treatment: 9 HBO Safety Checklist Items Safety Checklist Consent Form Signed Patient voided / foley secured and emptied When did you last eato 03/07/22 am Last dose of injectable or oral agent 03/07/22 am Ostomy pouch emptied and vented if applicable NA All implantable devices assessed, documented and approved NA Intravenous access site secured and place NA Valuables secured Linens and cotton and cotton/polyester blend (less than 51% polyester) Personal oil-based products / skin lotions / body lotions removed Wigs or hairpieces removed NA Smoking or tobacco materials removed NA Books / newspapers / magazines / loose paper removed NA Cologne, aftershave, perfume and deodorant removed Jewelry removed (may wrap wedding band) Make-up removed NA Hair care products removed Battery operated devices (external) removed NA Heating patches and chemical warmers removed NA Titanium eyewear removed NA Nail polish cured greater than 10 hours NA Casting material cured greater than 10 hours NA Hearing aids removed NA Loose dentures or partials removed NA Prosthetics have been removed NA Patient demonstrates correct use of air break device (if applicable) Patient concerns have been  addressed Patient grounding bracelet on and cord attached to chamber Specifics for Inpatients (complete in addition to above) Medication sheet sent with patient Intravenous medications needed or due during therapy sent with patient Drainage tubes (e.g. nasogastric tube or chest tube secured and vented) Endotracheal  or Tracheotomy tube secured Cuff deflated of air and inflated with saline Airway suctioned Electronic Signature(s) Signed: 03/07/2022 11:35:21 AM By: [name] RCP, RRT CHT , , Entered By: [name] on 03/07/2022 09:46:10 ,

## 2022-03-09 ENCOUNTER — Encounter (HOSPITAL_BASED_OUTPATIENT_CLINIC_OR_DEPARTMENT_OTHER): Payer: Medicare HMO | Admitting: Internal Medicine

## 2022-03-09 DIAGNOSIS — M86372 Chronic multifocal osteomyelitis, left ankle and foot: Secondary | ICD-10-CM | POA: Diagnosis not present

## 2022-03-09 DIAGNOSIS — L97522 Non-pressure chronic ulcer of other part of left foot with fat layer exposed: Secondary | ICD-10-CM | POA: Diagnosis not present

## 2022-03-09 DIAGNOSIS — E11621 Type 2 diabetes mellitus with foot ulcer: Secondary | ICD-10-CM

## 2022-03-09 LAB — GLUCOSE, CAPILLARY
Glucose-Capillary: 156 mg/dL — ABNORMAL HIGH (ref 70–99)
Glucose-Capillary: 173 mg/dL — ABNORMAL HIGH (ref 70–99)

## 2022-03-09 NOTE — Progress Notes (Signed)
SHIN, LAMOUR ([id number]) [id number] Page 1 of 2 Visit Report for 03/09/2022 Arrival Information Details Patient Name: Date of Service: Raymond [name]. 03/09/2022 8:00 A M Medical Record Number: [id number] Patient Account Number: [id number] Date of Birth/Sex: Treating RN: [birth year] (57 y.o. Raymond [name], Raymond [name] Primary Care Raymond [name]: Raymond [name] Other Clinician: Jacqulyn [name] Referring Raymond [name]: Treating Raymond [name]/Extender: Raymond [name] in Treatment: 9 Visit Information History Since Last Visit Added or deleted any medications: No Patient Arrived: Ambulatory Any new allergies or adverse reactions: No Arrival Time: 07:50 Had a fall or experienced change in No Accompanied By: self activities of [name] living that may affect Transfer Assistance: None risk of falls: Patient Identification Verified: Yes Signs or symptoms of abuse/neglect since No Secondary Verification Process Completed: Yes last visito Patient Requires Transmission-Based Precautions: No Hospitalized since last visit: No Patient Has Alerts: Yes Implantable device outside of the clinic No Patient Alerts: Type II Diabetic excluding Aspirin '81mg'$  cellular tissue based products placed in the ABI L [place]>220 center since last visit: Has Dressing in Place as Prescribed: Yes Has Footwear/Offloading in Place as Yes Prescribed: Left: Surgical Shoe with Pressure Relief Insole Pain Present Now: No Electronic Signature(s) Signed: 03/09/2022 11:10:26 AM By: [name] RCP, RRT CHT , , Entered By: [name] on 03/09/2022 08:42:54 , -------------------------------------------------------------------------------- Encounter Discharge Information Details Patient Name: Date of Service: Raymond [name], Raymond Negus D. 03/09/2022 8:00 A M Medical Record Number: [id number] Patient Account Number: [id number] Date of Birth/Sex: Treating RN: [birth year]  (57 y.o. Raymond [name], Raymond [name] Primary Care Rakisha Pincock: Raymond [name] Other Clinician: Jacqulyn [name] Referring Geanie Pacifico: Treating Lian Tanori/Extender: Raymond [name] in Treatment: 9 Encounter Discharge Information Items Discharge Condition: Stable Ambulatory Status: Ambulatory Discharge Destination: Home Raymond [name] ([id number]) [phone].pdf Page 2 of 2 Transportation: Other Accompanied By: transportation Schedule Follow-up Appointment: Yes Clinical Summary of Care: Notes Patient has an HBO treatment scheduled on 03/10/22 '@09'$ :00 am. Electronic Signature(s) Signed: 03/09/2022 11:10:26 AM By: [name] RCP, RRT CHT , , Entered By: [name] on 03/09/2022 11:10:02 , -------------------------------------------------------------------------------- Vitals Details Patient Name: Date of Service: Raymond [name], Raymond Negus D. 03/09/2022 8:00 A M Medical Record Number: [id number] Patient Account Number: [id number] Date of Birth/Sex: Treating RN: April 28, 1965 (57 y.o. Raymond [name], Raymond [name] Primary Care Jhoan Schmieder: Raymond [name] Other Clinician: Jacqulyn [name] Referring Zavior Thomason: Treating Anise Harbin/Extender: Raymond [name] in Treatment: 9 Vital Signs Time Taken: 08:01 Temperature (F): 98.2 Height (in): 74 Pulse (bpm): 72 Weight (lbs): 371 Respiratory Rate (breaths/min): 18 Body Mass Index (BMI): 47.6 Blood Pressure (mmHg): 130/60 Capillary Blood Glucose (mg/dl): 156 Reference Range: 80 - 120 mg / dl Electronic Signature(s) Signed: 03/09/2022 11:10:26 AM By: [name] RCP, RRT CHT , , Entered By: [name] on 03/09/2022 08:56:13 ,

## 2022-03-09 NOTE — Progress Notes (Signed)
JAMAURI, KRUZEL ([id number]) [id number] Page 1 of 2 Visit Report for 03/09/2022 HBO Details Patient Name: Date of Service: Raymond [name]. 03/09/2022 8:00 A M Medical Record Number: [id number] Patient Account Number: [id number] Date of Birth/Sex: Treating RN: May 12, 1965 (56 y.o. 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 HBO Treatment Details Treatment Number: 10 Patient Type: Outpatient Chamber Type: Monoplace Chamber Serial #: [id number] Treatment Protocol: 2.0 ATA with 90 minutes oxygen, and no air breaks Treatment Details Compression Rate Down: 1.5 psi / minute De-Compression Rate Up: 2.0 psi / minute Air breaks and breathing Decompress Decompress Compress Tx Pressure Begins Reached periods Begins Ends (leave unused spaces blank) Chamber Pressure (ATA 1 2 ------2 1 ) Clock Time (24 hr) 08:31 08:43 - - - - - - 10:14 10:22 Treatment Length: 111 (minutes) Treatment Segments: 4 Vital Signs Capillary Blood Glucose Reference Range: 80 - 120 mg / dl HBO Diabetic Blood Glucose Intervention Range: <131 mg/dl or >249 mg/dl Type: Time Vitals Blood Respiratory Capillary Blood Glucose Pulse Action Pulse: Temperature: Taken: Pressure: Rate: Glucose (mg/dl): Meter #: Oximetry (%) Taken: Pre 08:01 130/60 72 18 98.2 156 1 none per protocol Post 10:39 138/68 72 18 97.9 173 1 none per protocol Treatment Response Treatment Toleration: Well Treatment Completion Status: Treatment Completed without Adverse Event HBO Attestation I certify that I supervised this HBO treatment in  accordance with Medicare guidelines. A trained emergency response team is readily available per Yes hospital policies and procedures. Continue HBOT as ordered. Yes Electronic Signature(s) Signed: 03/09/2022 2:40:27 PM By: [name] DO Previous Signature: 03/09/2022 11:10:26 AM Version By: [name] RCP, RRT CHT , , Previous Signature: 03/09/2022 12:08:51 PM Version By: Sherrin Daisy ([id number]) [id number] Page 2 of 2 Entered By: [name] on 03/09/2022 12:45:15 -------------------------------------------------------------------------------- HBO Safety Checklist Details Patient Name: Date of Service: Raymond [name] 03/09/2022 8:00 A M Medical Record Number: [id number] Patient Account Number: [id number] Date of Birth/Sex: Treating RN: 1965/12/28 (56 y.o. Male) Cornell Barman Primary Care Kattleya Kuhnert: Beverlyn Roux Other Clinician: [name] Referring Shonice Wrisley: Treating Elfrieda Espino/Extender: Chad Cordial Weeks in Treatment: 9 HBO Safety Checklist Items Safety Checklist Consent Form Signed Patient voided / foley secured and emptied When did you last eato 03/09/22 am Last dose of injectable or oral agent 03/09/22 am metformin Ostomy pouch emptied and vented if applicable NA All implantable devices assessed, documented and approved NA Intravenous access site secured and place NA Valuables secured Linens and cotton and cotton/polyester blend (less than 51% polyester) Personal oil-based products / skin lotions / body lotions removed Wigs or hairpieces removed NA Smoking or tobacco materials removed NA Books / newspapers / magazines / loose paper removed NA Cologne, aftershave, perfume and deodorant removed Jewelry removed (may wrap wedding band) Make-up removed NA Hair care products removed Battery operated devices (external) removed NA Heating patches and chemical warmers  removed NA Titanium eyewear removed NA Nail polish cured greater than 10 hours NA Casting material cured greater than 10 hours NA Hearing aids removed NA Loose dentures or partials removed NA Prosthetics have been removed NA Patient demonstrates correct use of air break device (if applicable) Patient concerns have been addressed  Patient grounding bracelet on and cord attached to chamber Specifics for Inpatients (complete in addition to above) Medication sheet sent with patient Intravenous medications needed or due during therapy sent with patient Drainage tubes (e.g. nasogastric tube or chest tube secured and vented) Endotracheal or Tracheotomy tube secured Cuff deflated of air and inflated with saline Airway suctioned Electronic Signature(s) Signed: 03/09/2022 11:10:26 AM By: [name] RCP, RRT CHT , , Entered By: [name] on 03/09/2022 08:59:26 ,

## 2022-03-10 ENCOUNTER — Encounter: Payer: Medicare HMO | Admitting: Physician Assistant

## 2022-03-10 DIAGNOSIS — E11621 Type 2 diabetes mellitus with foot ulcer: Secondary | ICD-10-CM | POA: Diagnosis not present

## 2022-03-10 LAB — GLUCOSE, CAPILLARY
Glucose-Capillary: 338 mg/dL — ABNORMAL HIGH (ref 70–99)
Glucose-Capillary: 176 mg/dL — ABNORMAL HIGH (ref 70–99)

## 2022-03-10 NOTE — Progress Notes (Signed)
ADONIS, YIM ([id number]) [id number] Page 1 of 2 Visit Report for 03/10/2022 Arrival Information Details Patient Name: Date of Service: [name]. 03/10/2022 8:00 A M Medical Record Number: [id number] Patient Account Number: [id number] Date of Birth/Sex: Treating RN: [birth year] (57 y.o. [name] Primary Care Sayde Lish: [name] Other Clinician: Referring Milik Gilreath: Treating Esiah Bazinet/Extender: Hazle Coca in Treatment: 9 Visit Information History Since Last Visit Has Dressing in Place as Prescribed: Yes Patient Arrived: Ambulatory Pain Present Now: No Arrival Time: 08:05 Transfer Assistance: None Patient Identification Verified: Yes Secondary Verification Process Completed: Yes Patient Requires Transmission-Based Precautions: No Patient Has Alerts: Yes Patient Alerts: Type II Diabetic Aspirin '81mg'$  ABI L [place]>220 Electronic Signature(s) Signed: 03/10/2022 8:28:36 AM By: Gretta Cool, BSN, RN, CWS, Kim RN, BSN Entered By: Gretta Cool, BSN, RN, CWS, Kim on 03/10/2022 08:28:35 -------------------------------------------------------------------------------- Encounter Discharge Information Details Patient Name: Date of Service: [name], [name] 03/10/2022 8:00 A M Medical Record Number: [id number] Patient Account Number: [id number] Date of Birth/Sex: Treating RN: 1965-02-02 (57 y.o. [name] Primary Care Nataleah Scioneaux: [name] Other Clinician: Referring Ranee Peasley: Treating Boe Deans/Extender: Hazle Coca in Treatment: 9 Encounter Discharge Information Items Discharge Condition: Stable Ambulatory Status: Ambulatory Discharge Destination: Home Accompanied By: self Schedule Follow-up Appointment: Yes Clinical Summary of Care: Electronic Signature(s) Signed: 03/10/2022 10:39:36 AM By: Gretta Cool, BSN, RN, CWS, Kim RN, BSN [place], Gwyndolyn Saxon D ([id number]) [phone].pdf  Page 2 of 2 Entered By: Gretta Cool, BSN, RN, CWS, Kim on 03/10/2022 10:39:36 -------------------------------------------------------------------------------- Vitals Details Patient Name: Date of Service: [name], [name] 03/10/2022 8:00 A M Medical Record Number: [id number] Patient Account Number: [id number] Date of Birth/Sex: Treating RN: [birth year] (57 y.o. [name], [name] Primary Care Locklyn Henriquez: [name] Other Clinician: Referring [place]: Treating Sarabella Caprio/Extender: Hazle Coca in Treatment: 9 Vital Signs Time Taken: 08:06 Temperature (F): 98.5 Height (in): 74 Pulse (bpm): 88 Weight (lbs): 371 Respiratory Rate (breaths/min): 18 Body Mass Index (BMI): 47.6 Blood Pressure (mmHg): 148/62 Capillary Blood Glucose (mg/dl): 338 Reference Range: 80 - 120 mg / dl Electronic Signature(s) Signed: 03/10/2022 8:29:05 AM By: Gretta Cool, BSN, RN, CWS, Kim RN, BSN Entered By: Gretta Cool, BSN, RN, CWS, Kim on 03/10/2022 61:22:44

## 2022-03-10 NOTE — Progress Notes (Addendum)
CONCETTO, SINGLETON ([id number]) [id number] Page 1 of 12 Visit Report for 03/10/2022 Chief Complaint Document Details Patient Name: Date of Service: Raymond [name]. 03/10/2022 11:00 A M Medical Record Number: [id number] Patient Account Number: [id number] Date of Birth/Sex: Treating RN: 06-21-1965 (57 y.o. Raymond [name] Primary Care Provider: [name] Other Clinician: Massie Kluver Referring Provider: Treating Provider/Extender: Hazle Coca in Treatment: 9 Information Obtained from: Patient Chief Complaint Left foot ulcer with chronic osteomyelitis Electronic Signature(s) Signed: 03/10/2022 11:25:02 AM By: [name] PA-C Entered By: [name] on 03/10/2022 11:25:02 -------------------------------------------------------------------------------- Debridement Details Patient Name: Date of Service: Raymond Eke D. 03/10/2022 11:00 A M Medical Record Number: [id number] Patient Account Number: [id number] Date of Birth/Sex: Treating RN: Jan 23, 1966 (57 y.o. Raymond [name], Raymond [name] Primary Care Provider: [name] Other Clinician: Massie Kluver Referring Provider: Treating Provider/Extender: [name] Weeks in Treatment: 9 Debridement Performed for Assessment: Wound #3 Left T Third oe Performed By: Physician Raymond Sams., PA-C Debridement Type: Debridement Severity of Tissue Pre Debridement: Fat layer exposed Level of Consciousness (Pre-procedure): Awake and Alert Pre-procedure Verification/Time Out Yes - 11:28 Taken: Start Time: 11:28 T Area Debrided (L x W): otal 1 (cm) x 1 (cm) = 1 (cm) Tissue and other material debrided: Viable, Non-Viable, Callus Level: Non-Viable Tissue Debridement Description: Selective/Open Wound Instrument: Curette Bleeding: Minimum Hemostasis Achieved: Pressure Response to Treatment: Procedure was tolerated well Level of Consciousness (Post- Awake and  Alert procedure): ROXIE, ARB ([id number]) [id number] Page 2 of 12 Post Debridement Measurements of Total Wound Length: (cm) 0.3 Width: (cm) 0.4 Depth: (cm) 0.2 Volume: (cm) 0.019 Character of Wound/Ulcer Post Debridement: Stable Severity of Tissue Post Debridement: Fat layer exposed Post Procedure Diagnosis Same as Pre-procedure Electronic Signature(s) Signed: 03/10/2022 6:29:11 PM By: [name] PA-C Signed: 03/11/2022 1:48:57 PM By: Massie Kluver Signed: 03/11/2022 1:49:05 PM By: Gretta Cool, BSN, RN, CWS, Kim RN, BSN Entered By: Massie Kluver on 03/10/2022 11:29:27 -------------------------------------------------------------------------------- Debridement Details Patient Name: Date of Service: Raymond Eke D. 03/10/2022 11:00 A M Medical Record Number: [id number] Patient Account Number: [id number] Date of Birth/Sex: Treating RN: 04-30-1965 (57 y.o. Raymond [name], Raymond [name] Primary Care Provider: [name] Other Clinician: Massie Kluver Referring Provider: Treating Provider/Extender: Hazle Coca in Treatment: 9 Debridement Performed for Assessment: Wound #2 Left,Medial Metatarsal head first Performed By: Physician Raymond Sams., PA-C Debridement Type: Debridement Severity of Tissue Pre Debridement: Fat layer exposed Level of Consciousness (Pre-procedure): Awake and Alert Pre-procedure Verification/Time Out Yes - 11:30 Taken: Start Time: 11:30 T Area Debrided (L x W): otal 1 (cm) x 1 (cm) = 1 (cm) Tissue and other material debrided: Viable, Non-Viable, Callus, Slough, Subcutaneous, Slough Level: Skin/Subcutaneous Tissue Debridement Description: Excisional Instrument: Curette Bleeding: Minimum Hemostasis Achieved: Pressure Response to Treatment: Procedure was tolerated well Level of Consciousness (Post- Awake and Alert procedure): Post Debridement Measurements of Total Wound Length: (cm) 0.5 Width: (cm)  0.2 Depth: (cm) 0.3 Volume: (cm) 0.024 Character of Wound/Ulcer Post Debridement: Stable Severity of Tissue Post Debridement: Fat layer exposed Post Procedure Diagnosis Same as Pre-procedure Electronic Signature(s) Signed: 03/10/2022 6:29:11 PM By: [name] PA-C Signed: 03/11/2022 1:48:57 PM By: Massie Kluver Signed: 03/11/2022 1:49:05 PM By: Gretta Cool, BSN, RN, CWS, Kim RN, BSN [place], [place] D ([id number]) [phone].pdf Page 3 of 12 Entered By: Massie Kluver on 03/10/2022 11:32:37 -------------------------------------------------------------------------------- HPI Details Patient Name: Date of Service: Raymond [name]. 03/10/2022 11:00 A M Medical  Record Number: [id number] Patient Account Number: [id number] Date of Birth/Sex: Treating RN: 1965-10-11 (57 y.o. Raymond [name] Primary Care Provider: [name] Other Clinician: Massie Kluver Referring Provider: Treating Provider/Extender: [name] Weeks in Treatment: 9 History of Present Illness HPI Description: 09/05/17-He is seeing an initial evaluation for a left plantar foot ulcer. He has a remote history of left great toe amputation. He states that 4-6 weeks ago he noted callus formation and ulceration. He has not seen primary care regarding this. He is not currently on antibiotic therapy. He does not routinely follow with podiatry. He states diabetic foot wear will arrive early next week. The EHR shows an A1c of 9% approximate 4 months ago but he states he had one and primary care a few weeks ago but does not know the results. He is neuropathic and does not complain of any pain, he is currently wearing crocs. 09/12/17-he is here in follow up evaluation for left plantar foot ulcer. There is improvement in both appearance and measurement. We will continue with same treatment plan and he will follow up next week. 09/19/17 on evaluation today patient actually appears to be doing  excellent in regard to his ulcer on the invitation site of his left great foot plantar aspect. He's been tolerating the dressing changes without complication. In fact with the Prisma and the current measures he has been shown signs of excellent improvement week by week up to this point. We have been to breeding the wound in this seems to have been of great benefit for him. Fortunately there is no evidence of infection. 09/26/17 on evaluation today patient appears to be doing rather well in regard to his wound. He did not know quite as much improvement this week as compared to last week. Nonetheless he still continues to show signs of improving to some degree. I do believe he may benefit from an offloading shoe. No fevers, chills, nausea, or vomiting noted at this time. 10/03/17 on evaluation today patient actually appears to be doing much better in regard to the amputation site plantar foot ulcer. Overall this appears significantly smaller even compared to previous. He's been tolerating the dressing changes without complication. He did get his diabetic shoes and I did have a look at them they appear to be fairly good. Nonetheless I do think that for the time being I would probably recommend he continue with the offloading shoe that we have been utilizing just due to the fact that with the dressing I don't know that his diabetic shoes are going to work as appropriately as far as not called an additional pressure and irritation to the area in question. He understands. 10/10/17 on evaluation today patient actually appears to be doing very well in regard to his plantar foot ulcer. He has been tolerating the dressing changes without complication. I do feel like he's making signs of good improvement and in fact of the wound bed appears to be better although it may not be significantly changed in size it appears healthier and I do believe is showing signs of improving. Nonetheless we gonna keep working towards  healing as far as that is concerned. There is no evidence of infection. 10/17/17 on evaluation today patient actually appears to be doing poorly in regard to his plantar foot ulcer. Unfortunately other than just the area where the wound is itself there appears to be a blister that communicates with the wound unfortunately. This is more lateral to the wound itself and also  to the distal point of the amputation site. There does not appear to be any evidence of cellulitis spreading at the foot but I do believe some of the drainage from the site itself is. When in nature. Nonetheless this blister area I think needs to be removed in order to allow for appropriate healing of the ulcer itself I think that is gonna be difficult for it to heal otherwise. This was discussed with the patient today. 10/24/17 on evaluation today patient actually appears to be doing better compared to last week even post debridement. He has shown signs of improvement he did test positive for Staphylococcus aureus. With that being said he notes that he's not have any discomfort and in general he does feel like things seem to been doing better. Fortunately there's no additional blistering and no evidence of remaining infection at this time. No fevers, chills, nausea, or vomiting noted at this time. He has three days of antibiotic left. 10/31/17 on evaluation today patient actually appears to be doing very well in regard to his ulcer on the left foot. He has been tolerating the dressing changes without complication. With that being said fortunately there appears to be no evidence of infection I do think he's made progress compared to previous. No fevers, chills, nausea, or vomiting noted at this time. 11/14/17 on evaluation today patient actually appears to be doing excellent in regard to his amputation site ulcer on his foot. In fact this appears to be completely healed at this point. There is no evidence of infection nor underline  abscess and I did thoroughly evaluate the periwound location. Overall I'm very pleased with how things appear. Readmission: 01-03-2022 upon evaluation today patient appears for reevaluation here in the clinic although it has been since 2019 that I last saw him. This again has been over 4 years. With that being said he is having an issue with a wound on his foot unfortunately. He has not had any recent x-rays he tells me at this point which is unfortunate as avoid definitely can need to get something started that can be one of the primary items to get moving forward with. With that being said I also think were probably can obtain a wound culture he is probably going to require some antibiotics at some point. I just want to make sure that we have him on the right thing when we do this. Currently the patient tells me that his wounds have been present in regard to the met head for about a month at this point. With that being said he also has a wound on the third toe which is the next toe this actually still present. This unfortunately has a lot of callus buildup around it but I think it is larger underneath than what it would appear on initial inspection. Patient does have a history of diabetes mellitus type 2, congestive heart failure, and COPD. GUNNARD, RUMMELL ([id number]) [id number] Page 4 of 12 01-11-2022 upon evaluation patient's wounds actually are showing signs of doing about the same may be just slightly cleaner but definitely not where we want things to be as of yet. Fortunately there does not appear to be any signs of active infection locally nor systemically at this point which is great news. No fevers, chills, nausea, vomiting, or diarrhea. Of note patient has had his MRI but we do not have the results of that as of yet we are still waiting for the reading on this at this point. 01-18-2022  upon evaluation today patient presents after having had his MRI  finally complete. 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 The third and fourth digits seem to be early which is good news. Nonetheless we are still going to need to try to see about getting the infection under control he is already been on the antibiotic therapy which I think has done quite well. In fact I feel like he is showing signs of improvement already. 12/28; patient with a wound on the medial aspect of the left first metatarsal head and an area on the left third toe with slight hammer deformity. He has had a previous left first toe amputation. We are using Iodoflex on the wound. He is a diabetic a recent MRI showed osteomyelitis we have been giving him Cipro and Doxy which he appears to be tolerating well. 02-10-2022 upon evaluation today patient unfortunately has issues still with open wounds of his foot on the left. He does have known osteomyelitis at this point and that is something that we have been treating with oral antibiotics. I actually have not seen him personally since 19 December. In that time he has not had any debridement from that point until today based on what I can see as best I can tell anyway. Has been on Cipro as well as doxycycline. Nonetheless he does still seem to have open wounds today and I think that he would benefit from hyperbaric oxygen therapy. I discussed that with him today as well. 02-18-2022 upon evaluation today patient appears to be doing about the same in regard to his wounds. Fortunately I do not see any signs of infection although he still has areas that seem to initially close down but that he has a lot of callus that still open at both locations most on the metatarsal region as well as the toe. I think that he is appropriate for proceeding with the hyperbarics and I think the sooner we get this done to try to get these wounds healed the better off he will be. 02-25-2022 upon evaluation  today patient's wounds do show signs of being dry get again. We were attempting to pack and some of the alginate dressing instead of doing the collagen as everything was getting very dry but nonetheless this is still continue to be an ongoing issue. My suggestion based on what I am seeing is good to be that we actually switch to doing Xeroform which should hopefully keep this a little bit more moist and from hopefully closing up prematurely. The patient voiced understanding. 03-03-2022 upon evaluation today patient appears to be doing well with regard to his wounds things as before appear to be getting dry. We are using Xeroform to try to keep it open is much as possible but each time I see him he does tend to cover over the good news is each time he also seems to be getting a little bit smaller which is excellent. Fortunately I do not see any evidence of active infection locally nor systemically which is great news. I think this means that between the antibiotics and the hyperbarics were really on a good track here. 03-10-2022 upon evaluation today patient appears to be doing well currently in regard to his wounds. He is actually showing signs of some good improvement he is also tolerating HBO therapy very well. I do not see any evidence of active infection locally nor systemically which is great news  and overall I am extremely pleased in that regard. No fevers, chills, nausea, vomiting, or diarrhea. Electronic Signature(s) Signed: 03/10/2022 12:47:39 PM By: [name] PA-C Entered By: [name] on 03/10/2022 12:47:39 -------------------------------------------------------------------------------- Physical Exam Details Patient Name: Date of Service: Raymond Eke D. 03/10/2022 11:00 A M Medical Record Number: [id number] Patient Account Number: [id number] Date of Birth/Sex: Treating RN: 1965-07-03 (57 y.o. Raymond [name] Primary Care Provider: [name] Other Clinician: Massie Kluver Referring Provider: Treating Provider/Extender: [name] Weeks in Treatment: 9 Constitutional Obese and well-hydrated in no acute distress. Respiratory normal breathing without difficulty. Psychiatric this patient is able to make decisions and demonstrates good insight into disease process. Alert and Oriented x 3. pleasant and cooperative. Notes Upon inspection patient's wound bed actually showed signs of healing quite nicely in fact I think his toe on the left foot which is the middle toe is almost completely healed. In regard to the first metatarsal amputation site this is doing better but does still require some sharp debridement at both locations. I was able to remove just callus over the toe area and again I think this is almost completely gone. In regard to the wound over the medial met head amputation site this does have more depth to it once I remove the callus, try something different from a dressing standpoint this week. WESTAN, DRESEN ([id number]) [id number] Page 5 of 12 Electronic Signature(s) Signed: 03/10/2022 12:48:28 PM By: [name] PA-C Entered By: [name] on 03/10/2022 12:48:28 -------------------------------------------------------------------------------- Physician Orders Details Patient Name: Date of Service: Raymond Eke D. 03/10/2022 11:00 A M Medical Record Number: [id number] Patient Account Number: [id number] Date of Birth/Sex: Treating RN: 02/24/1965 (57 y.o. Raymond [name], Raymond [name] Primary Care Provider: [name] Other Clinician: Massie Kluver Referring Provider: Treating Provider/Extender: Hazle Coca in Treatment: 9 Verbal / Phone Orders: No Diagnosis Coding ICD-10 Coding Code Description [phone] Chronic multifocal osteomyelitis, left ankle and foot E11.621 Type 2 diabetes mellitus with foot ulcer L97.522 Non-pressure chronic ulcer of other part of  left foot with fat layer exposed I50.42 Chronic combined systolic (congestive) and diastolic (congestive) heart failure J44.9 Chronic obstructive pulmonary disease, unspecified Follow-up Appointments Wound #3 Left T Third oe Return Appointment in 1 week. Bathing/ [employer] wounds with antibacterial soap and water. May shower; gently cleanse wound with antibacterial soap, rinse and pat dry prior to dressing wounds Off-Loading Open toe surgical shoe Hyperbaric Oxygen Therapy Wound #2 Left,Medial Metatarsal head first Indication and location: - [place] Grade 3 ulcers of the left foot and 3rd toe If appropriate for treatment, begin HBOT per protocol: 2.0 ATA for 90 Minutes without A Breaks ir One treatment per day (delivered Monday through Friday unless otherwise specified in Special Instructions below): Total # of Treatments: - 40 A ntihistamine 30 minutes prior to HBO Treatment, difficulty clearing ears. Finger stick Blood Glucose Pre- and Post- HBOT Treatment. Follow Hyperbaric Oxygen Glycemia Protocol Wound #3 Left T Third oe Evaluate for HBO Therapy Indication and location: - [place] Grade 3 ulcers of the left foot and 3rd toe If appropriate for treatment, begin HBOT per protocol: 2.0 ATA for 90 Minutes without A Breaks ir One treatment per day (delivered Monday through Friday unless otherwise specified in Special Instructions below): Total # of Treatments: - 40 A ntihistamine 30 minutes prior to HBO Treatment, difficulty clearing ears. Finger stick Blood Glucose Pre- and Post- HBOT Treatment. Follow Hyperbaric Oxygen Glycemia Protocol  Wound Treatment Wound #2 - Metatarsal head first Wound Laterality: Left, Medial Cleanser: Byram Ancillary Kit - 15 Day Supply (Generic) 3 x Per Week/30 Days Discharge Instructions: Use supplies as instructed; Kit contains: (15) Saline Bullets; (15) 3x3 Gauze; 15 pr Gloves NACE, Raymond [name] ([id number])  [id number] Page 6 of 12 Cleanser: Soap and Water 3 x Per Week/30 Days Discharge Instructions: Gently cleanse wound with antibacterial soap, rinse and pat dry prior to dressing wounds Prim Dressing: Endoform Natural, Non-fenestrated, 2x2 (in/in) ary 3 x Per Week/30 Days Secondary Dressing: ABD Pad 5x9 (in/in) 3 x Per Week/30 Days Discharge Instructions: Cover with ABD pad Secondary Dressing: Kerlix 4.5 x 4.1 (in/yd) 3 x Per Week/30 Days Discharge Instructions: Apply Kerlix 4.5 x 4.1 (in/yd) as instructed Secondary Dressing: mepitel 3 x Per Week/30 Days Secured With: Medipore T - 26M Medipore H Soft Cloth Surgical T ape ape, 2x2 (in/yd) (Generic) 3 x Per Week/30 Days Secured With: Steri-Strip 0.25x4 (in/in) 3 x Per Week/30 Days Discharge Instructions: Apply Steri-Strip as directed Wound #3 - T Third oe Wound Laterality: Left Cleanser: Byram Ancillary Kit - 15 Day Supply (Generic) 3 x Per Week/30 Days Discharge Instructions: Use supplies as instructed; Kit contains: (15) Saline Bullets; (15) 3x3 Gauze; 15 pr Gloves Cleanser: Soap and Water 3 x Per Week/30 Days Discharge Instructions: Gently cleanse wound with antibacterial soap, rinse and pat dry prior to dressing wounds Prim Dressing: Xeroform 4x4-HBD (in/in) 3 x Per Week/30 Days ary Discharge Instructions: Apply Xeroform 4x4-HBD (in/in) as directed Secondary Dressing: Kerlix 4.5 x 4.1 (in/yd) 3 x Per Week/30 Days Discharge Instructions: Apply Kerlix 4.5 x 4.1 (in/yd) as instructed Secured With: Medipore T - 26M Medipore H Soft Cloth Surgical T ape ape, 2x2 (in/yd) (Generic) 3 x Per Week/30 Days GLYCEMIA INTERVENTIONS PROTOCOL PRE-HBO GLYCEMIA INTERVENTIONS ACTION INTERVENTION Obtain pre-HBO capillary blood glucose (ensure 1 physician order is in chart). A. Notify HBO physician and await physician orders. 2 If result is 70 mg/dl or below: B. If the result meets the hospital definition of a critical  result, follow hospital policy. A. Give patient an 8 ounce Glucerna Shake, an 8 ounce Ensure, or 8 ounces of a Glucerna/Ensure equivalent dietary supplement*. B. Wait 30 minutes. If result is 71 mg/dl to 130 mg/dl: C. Retest patients capillary blood glucose (CBG). D. If result greater than or equal to 110 mg/dl, proceed with HBO. If result less than 110 mg/dl, notify HBO physician and consider holding HBO. If result is 131 mg/dl to 249 mg/dl: A. Proceed with HBO. A. Notify HBO physician and await physician orders. B. It is recommended to hold HBO and do If result is 250 mg/dl or greater: blood/urine ketone testing. C. If the result meets the hospital definition of a critical result, follow hospital policy. POST-HBO GLYCEMIA INTERVENTIONS ACTION INTERVENTION Obtain post HBO capillary blood glucose (ensure 1 physician order is in chart). A. Notify HBO physician and await physician orders. 2 If result is 70 mg/dl or below: B. If the result meets the hospital definition of a critical result, follow hospital policy. A. Give patient an 8 ounce Glucerna Shake, an 8 ounce Ensure, or 8 ounces of a Glucerna/Ensure equivalent dietary supplement*. B. Wait 15 minutes for symptoms of If result is 71 mg/dl to 100 mg/dl: hypoglycemia (i.e. nervousness, anxiety, sweating, chills, clamminess, irritability, confusion, tachycardia or dizziness). C. If patient asymptomatic, discharge patient. If patient symptomatic, repeat capillary blood glucose (CBG) and notify HBO physician. If result is 101 mg/dl to 249 mg/dl: A.  Discharge patient. A. Notify HBO physician and await physician Raymond [name], SHAEFER ([id number]) [id number] Page 7 of 12 orders. B. It is recommended to do blood/urine ketone If result is 250 mg/dl or greater: testing. C. If the result meets the hospital definition of a critical result, follow hospital policy. *Juice or candies are NOT  equivalent products. If patient refuses the Glucerna or Ensure, please consult the hospital dietitian for an appropriate substitute. Electronic Signature(s) Signed: 03/10/2022 6:29:11 PM By: [name] PA-C Signed: 03/11/2022 1:48:57 PM By: Massie Kluver Entered By: Massie Kluver on 03/10/2022 11:37:58 -------------------------------------------------------------------------------- Problem List Details Patient Name: Date of Service: Raymond Eke D. 03/10/2022 11:00 A M Medical Record Number: [id number] Patient Account Number: [id number] Date of Birth/Sex: Treating RN: 01-06-1966 (57 y.o. Raymond [name], Raymond [name] Primary Care Provider: [name] Other Clinician: Massie Kluver Referring Provider: Treating Provider/Extender: [name] Weeks in Treatment: 9 Active Problems ICD-10 Encounter Code Description Active Date MDM Diagnosis [phone] Chronic multifocal osteomyelitis, left ankle and foot 02/10/2022 No Yes E11.621 Type 2 diabetes mellitus with foot ulcer 01/03/2022 No Yes L97.522 Non-pressure chronic ulcer of other part of left foot with fat layer exposed 01/03/2022 No Yes I50.42 Chronic combined systolic (congestive) and diastolic (congestive) heart failure 01/03/2022 No Yes J44.9 Chronic obstructive pulmonary disease, unspecified 01/03/2022 No Yes Inactive Problems Resolved Problems Electronic Signature(s) Signed: 03/10/2022 11:24:57 AM By: [name] PA-C Entered By: [name] on 03/10/2022 11:24:57 [name] ([id number]) [id number] Page 8 of 12 -------------------------------------------------------------------------------- Progress Note Details Patient Name: Date of Service: Raymond [name]. 03/10/2022 11:00 A M Medical Record Number: [id number] Patient Account Number: [id number] Date of Birth/Sex: Treating RN: Aug 28, 1965 (57 y.o. Raymond [name] Primary Care Provider: [name] Other  Clinician: Massie Kluver Referring Provider: Treating Provider/Extender: Hazle Coca in Treatment: 9 Subjective Chief Complaint Information obtained from Patient Left foot ulcer with chronic osteomyelitis History of Present Illness (HPI) 09/05/17-He is seeing an initial evaluation for a left plantar foot ulcer. He has a remote history of left great toe amputation. He states that 4-6 weeks ago he noted callus formation and ulceration. He has not seen primary care regarding this. He is not currently on antibiotic therapy. He does not routinely follow with podiatry. He states diabetic foot wear will arrive early next week. The EHR shows an A1c of 9% approximate 4 months ago but he states he had one and primary care a few weeks ago but does not know the results. He is neuropathic and does not complain of any pain, he is currently wearing crocs. 09/12/17-he is here in follow up evaluation for left plantar foot ulcer. There is improvement in both appearance and measurement. We will continue with same treatment plan and he will follow up next week. 09/19/17 on evaluation today patient actually appears to be doing excellent in regard to his ulcer on the invitation site of his left great foot plantar aspect. He's been tolerating the dressing changes without complication. In fact with the Prisma and the current measures he has been shown signs of excellent improvement week by week up to this point. We have been to breeding the wound in this seems to have been of great benefit for him. Fortunately there is no evidence of infection. 09/26/17 on evaluation today patient appears to be doing rather well in regard to his wound. He did not know quite as much improvement this week as compared to last week. Nonetheless he still  continues to show signs of improving to some degree. I do believe he may benefit from an offloading shoe. No fevers, chills, nausea, or vomiting noted at this  time. 10/03/17 on evaluation today patient actually appears to be doing much better in regard to the amputation site plantar foot ulcer. Overall this appears significantly smaller even compared to previous. He's been tolerating the dressing changes without complication. He did get his diabetic shoes and I did have a look at them they appear to be fairly good. Nonetheless I do think that for the time being I would probably recommend he continue with the offloading shoe that we have been utilizing just due to the fact that with the dressing I don't know that his diabetic shoes are going to work as appropriately as far as not called an additional pressure and irritation to the area in question. He understands. 10/10/17 on evaluation today patient actually appears to be doing very well in regard to his plantar foot ulcer. He has been tolerating the dressing changes without complication. I do feel like he's making signs of good improvement and in fact of the wound bed appears to be better although it may not be significantly changed in size it appears healthier and I do believe is showing signs of improving. Nonetheless we gonna keep working towards healing as far as that is concerned. There is no evidence of infection. 10/17/17 on evaluation today patient actually appears to be doing poorly in regard to his plantar foot ulcer. Unfortunately other than just the area where the wound is itself there appears to be a blister that communicates with the wound unfortunately. This is more lateral to the wound itself and also to the distal point of the amputation site. There does not appear to be any evidence of cellulitis spreading at the foot but I do believe some of the drainage from the site itself is. When in nature. Nonetheless this blister area I think needs to be removed in order to allow for appropriate healing of the ulcer itself I think that is gonna be difficult for it to heal otherwise. This was discussed  with the patient today. 10/24/17 on evaluation today patient actually appears to be doing better compared to last week even post debridement. He has shown signs of improvement he did test positive for Staphylococcus aureus. With that being said he notes that he's not have any discomfort and in general he does feel like things seem to been doing better. Fortunately there's no additional blistering and no evidence of remaining infection at this time. No fevers, chills, nausea, or vomiting noted at this time. He has three days of antibiotic left. 10/31/17 on evaluation today patient actually appears to be doing very well in regard to his ulcer on the left foot. He has been tolerating the dressing changes without complication. With that being said fortunately there appears to be no evidence of infection I do think he's made progress compared to previous. No fevers, chills, nausea, or vomiting noted at this time. 11/14/17 on evaluation today patient actually appears to be doing excellent in regard to his amputation site ulcer on his foot. In fact this appears to be completely healed at this point. There is no evidence of infection nor underline abscess and I did thoroughly evaluate the periwound location. Overall I'm very pleased with how things appear. Readmission: 01-03-2022 upon evaluation today patient appears for reevaluation here in the clinic although it has been since 2019 that I last saw him.  This again has been over 4 years. With that being said he is having an issue with a wound on his foot unfortunately. He has not had any recent x-rays he tells me at this point which is unfortunate as avoid definitely can need to get something started that can be one of the primary items to get moving forward with. With that being said I also think were probably can obtain a wound culture he is probably going to require some antibiotics at some point. I just want to make sure that we have him on the right thing  when we do this. Currently the patient tells me that his wounds have been present in regard to the met head for about a month at this point. With that being said he also has a wound on the third toe which is the next toe this actually still present. This unfortunately has a lot of callus buildup around it but I think it is larger underneath than what it would appear on initial inspection. Patient does have a history of diabetes mellitus type 2, congestive heart failure, and COPD. STAFFON, SASLOW ([id number]) [id number] Page 9 of 12 01-11-2022 upon evaluation patient's wounds actually are showing signs of doing about the same may be just slightly cleaner but definitely not where we want things to be as of yet. Fortunately there does not appear to be any signs of active infection locally nor systemically at this point which is great news. No fevers, chills, nausea, vomiting, or diarrhea. Of note patient has had his MRI but we do not have the results of that as of yet we are still waiting for the reading on this at this point. 01-18-2022 upon evaluation today patient presents after having had his MRI finally complete. 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 The third and fourth digits seem to be early which is good news. Nonetheless we are still going to need to try to see about getting the infection under control he is already been on the antibiotic therapy which I think has done quite well. In fact I feel like he is showing signs of improvement already. 12/28; patient with a wound on the medial aspect of the left first metatarsal head and an area on the left third toe with slight hammer deformity. He has had a previous left first toe amputation. We are using Iodoflex on the wound. He is a diabetic a recent MRI showed osteomyelitis we have been giving him Cipro and Doxy which he appears to be  tolerating well. 02-10-2022 upon evaluation today patient unfortunately has issues still with open wounds of his foot on the left. He does have known osteomyelitis at this point and that is something that we have been treating with oral antibiotics. I actually have not seen him personally since 19 December. In that time he has not had any debridement from that point until today based on what I can see as best I can tell anyway. Has been on Cipro as well as doxycycline. Nonetheless he does still seem to have open wounds today and I think that he would benefit from hyperbaric oxygen therapy. I discussed that with him today as well. 02-18-2022 upon evaluation today patient appears to be doing about the same in regard to his wounds. Fortunately I do not see any signs of infection although he still has areas that seem to initially close down but that he has  a lot of callus that still open at both locations most on the metatarsal region as well as the toe. I think that he is appropriate for proceeding with the hyperbarics and I think the sooner we get this done to try to get these wounds healed the better off he will be. 02-25-2022 upon evaluation today patient's wounds do show signs of being dry get again. We were attempting to pack and some of the alginate dressing instead of doing the collagen as everything was getting very dry but nonetheless this is still continue to be an ongoing issue. My suggestion based on what I am seeing is good to be that we actually switch to doing Xeroform which should hopefully keep this a little bit more moist and from hopefully closing up prematurely. The patient voiced understanding. 03-03-2022 upon evaluation today patient appears to be doing well with regard to his wounds things as before appear to be getting dry. We are using Xeroform to try to keep it open is much as possible but each time I see him he does tend to cover over the good news is each time he also seems to be  getting a little bit smaller which is excellent. Fortunately I do not see any evidence of active infection locally nor systemically which is great news. I think this means that between the antibiotics and the hyperbarics were really on a good track here. 03-10-2022 upon evaluation today patient appears to be doing well currently in regard to his wounds. He is actually showing signs of some good improvement he is also tolerating HBO therapy very well. I do not see any evidence of active infection locally nor systemically which is great news and overall I am extremely pleased in that regard. No fevers, chills, nausea, vomiting, or diarrhea. Objective Constitutional Obese and well-hydrated in no acute distress. Vitals Time Taken: 11:14 AM, Height: 74 in, Weight: 371 lbs, BMI: 47.6, Temperature: 98.5 F, Pulse: 88 bpm, Respiratory Rate: 18 breaths/min, Blood Pressure: 148/62 mmHg. Respiratory normal breathing without difficulty. Psychiatric this patient is able to make decisions and demonstrates good insight into disease process. Alert and Oriented x 3. pleasant and cooperative. General Notes: Upon inspection patient's wound bed actually showed signs of healing quite nicely in fact I think his toe on the left foot which is the middle toe is almost completely healed. In regard to the first metatarsal amputation site this is doing better but does still require some sharp debridement at both locations. I was able to remove just callus over the toe area and again I think this is almost completely gone. In regard to the wound over the medial met head amputation site this does have more depth to it once I remove the callus, try something different from a dressing standpoint this week. Integumentary (Hair, Skin) Wound #2 status is Open. Original cause of wound was Gradually Appeared. The date acquired was: 12/13/2021. The wound has been in treatment 9 weeks. The wound is located on the Left,Medial  Metatarsal head first. The wound measures 0.5cm length x 0.2cm width x 0.1cm depth; 0.079cm^2 area and 0.008cm^3 volume. There is Fat Layer (Subcutaneous Tissue) exposed. There is a large amount of serous drainage noted. The wound margin is flat and intact. There is large (67-100%) granulation within the wound bed. There is a small (1-33%) amount of necrotic tissue within the wound bed including Adherent Slough. Wound #3 status is Open. Original cause of wound was Footwear Injury. The date acquired was: 01/03/2022.  The wound has been in treatment 9 weeks. The wound is located on the Left T Third. The wound measures 0.3cm length x 0.4cm width x 0.1cm depth; 0.094cm^2 area and 0.009cm^3 volume. There is Fat oe Layer (Subcutaneous Tissue) exposed. There is a medium amount of serous drainage noted. The wound margin is flat and intact. There is large (67-100%) granulation within the wound bed. There is a small (1-33%) amount of necrotic tissue within the wound bed. Assessment Active Problems ICD-10 Chronic multifocal osteomyelitis, left ankle and foot Type 2 diabetes mellitus with foot ulcer Non-pressure chronic ulcer of other part of left foot with fat layer exposed [name] ([id number]) [id number] Page 10 of 12 Chronic combined systolic (congestive) and diastolic (congestive) heart failure Chronic obstructive pulmonary disease, unspecified Procedures Wound #2 Pre-procedure diagnosis of Wound #2 is a Diabetic Wound/Ulcer of the Lower Extremity located on the Left,Medial Metatarsal head first .Severity of Tissue Pre Debridement is: Fat layer exposed. There was a Excisional Skin/Subcutaneous Tissue Debridement with a total area of 1 sq cm performed by Raymond Sams., PA-C. With the following instrument(s): Curette to remove Viable and Non-Viable tissue/material. Material removed includes Callus, Subcutaneous Tissue, and Slough. A time out was conducted at  11:30, prior to the start of the procedure. A Minimum amount of bleeding was controlled with Pressure. The procedure was tolerated well. Post Debridement Measurements: 0.5cm length x 0.2cm width x 0.3cm depth; 0.024cm^3 volume. Character of Wound/Ulcer Post Debridement is stable. Severity of Tissue Post Debridement is: Fat layer exposed. Post procedure Diagnosis Wound #2: Same as Pre-Procedure Wound #3 Pre-procedure diagnosis of Wound #3 is a Diabetic Wound/Ulcer of the Lower Extremity located on the Left T Third .Severity of Tissue Pre Debridement is: oe Fat layer exposed. There was a Selective/Open Wound Non-Viable Tissue Debridement with a total area of 1 sq cm performed by Raymond Sams., PA-C. With the following instrument(s): Curette to remove Viable and Non-Viable tissue/material. Material removed includes Callus. A time out was conducted at 11:28, prior to the start of the procedure. A Minimum amount of bleeding was controlled with Pressure. The procedure was tolerated well. Post Debridement Measurements: 0.3cm length x 0.4cm width x 0.2cm depth; 0.019cm^3 volume. Character of Wound/Ulcer Post Debridement is stable. Severity of Tissue Post Debridement is: Fat layer exposed. Post procedure Diagnosis Wound #3: Same as Pre-Procedure Plan Follow-up Appointments: Wound #3 Left T Third: oe Return Appointment in 1 week. Bathing/ Shower/ Hygiene: Wash wounds with antibacterial soap and water. May shower; gently cleanse wound with antibacterial soap, rinse and pat dry prior to dressing wounds Off-Loading: Open toe surgical shoe Hyperbaric Oxygen Therapy: Wound #2 Left,Medial Metatarsal head first: Indication and location: - [place] Grade 3 ulcers of the left foot and 3rd toe If appropriate for treatment, begin HBOT per protocol: 2.0 ATA for 90 Minutes without Air Breaks One treatment per day (delivered Monday through Friday unless otherwise specified in Special Instructions below): T # of  Treatments: - 40 otal Antihistamine 30 minutes prior to HBO Treatment, difficulty clearing ears. Finger stick Blood Glucose Pre- and Post- HBOT Treatment. Follow Hyperbaric Oxygen Glycemia Protocol Wound #3 Left T Third: oe Evaluate for HBO Therapy Indication and location: - [place] Grade 3 ulcers of the left foot and 3rd toe If appropriate for treatment, begin HBOT per protocol: 2.0 ATA for 90 Minutes without Air Breaks One treatment per day (delivered Monday through Friday unless otherwise specified in Special Instructions below): T # of Treatments: - 40 otal Antihistamine  30 minutes prior to HBO Treatment, difficulty clearing ears. Finger stick Blood Glucose Pre- and Post- HBOT Treatment. Follow Hyperbaric Oxygen Glycemia Protocol WOUND #2: - Metatarsal head first Wound Laterality: Left, Medial Cleanser: Byram Ancillary Kit - 15 Day Supply (Generic) 3 x Per Week/30 Days Discharge Instructions: Use supplies as instructed; Kit contains: (15) Saline Bullets; (15) 3x3 Gauze; 15 pr Gloves Cleanser: Soap and Water 3 x Per Week/30 Days Discharge Instructions: Gently cleanse wound with antibacterial soap, rinse and pat dry prior to dressing wounds Prim Dressing: Endoform Natural, Non-fenestrated, 2x2 (in/in) 3 x Per Week/30 Days ary Secondary Dressing: ABD Pad 5x9 (in/in) 3 x Per Week/30 Days Discharge Instructions: Cover with ABD pad Secondary Dressing: Kerlix 4.5 x 4.1 (in/yd) 3 x Per Week/30 Days Discharge Instructions: Apply Kerlix 4.5 x 4.1 (in/yd) as instructed Secondary Dressing: mepitel 3 x Per Week/30 Days Secured With: Medipore T - 49M Medipore H Soft Cloth Surgical T ape ape, 2x2 (in/yd) (Generic) 3 x Per Week/30 Days Secured With: Steri-Strip 0.25x4 (in/in) 3 x Per Week/30 Days Discharge Instructions: Apply Steri-Strip as directed WOUND #3: - T Third Wound Laterality: Left oe Cleanser: Byram Ancillary Kit - 15 Day Supply (Generic) 3 x Per Week/30 Days Discharge Instructions:  Use supplies as instructed; Kit contains: (15) Saline Bullets; (15) 3x3 Gauze; 15 pr Gloves Cleanser: Soap and Water 3 x Per Week/30 Days Discharge Instructions: Gently cleanse wound with antibacterial soap, rinse and pat dry prior to dressing wounds Prim Dressing: Xeroform 4x4-HBD (in/in) 3 x Per Week/30 Days ary Discharge Instructions: Apply Xeroform 4x4-HBD (in/in) as directed Secondary Dressing: Kerlix 4.5 x 4.1 (in/yd) 3 x Per Week/30 Days Discharge Instructions: Apply Kerlix 4.5 x 4.1 (in/yd) as instructed Secured With: Medipore T - 49M Medipore H Soft Cloth Surgical T ape ape, 2x2 (in/yd) (Generic) 3 x Per Week/30 Days NAVRAJ, SAMPAYO ([id number]) [id number] Page 11 of 12 1. I am going to have the patient go ahead and continue with the wound care measures as before and the patient is in agreement with that plan this includes the use of the Xeroform gauze dressing in regard to the third toe left foot which seems to be doing quite well. 2. In regard to the metatarsal head/amputation site I would recommend that we actually packed this area with the endoform and then moistened it with hydrogel over top followed by the Adaptic and then secured with Steri-Strips similar to how we apply a skin substitute. Try to keep this nice and moist and hopefully also keep it open to allow it to heal more effectively. 3. I am also can recommend that we continue with HBO therapy I think he is tolerating this well I definitely believe that this is going to aid him in getting completely healed and prevent further amputation which is obviously our primary goal. We will see patient back for reevaluation in 1 week here in the clinic. If anything worsens or changes patient will contact our office for additional recommendations. Electronic Signature(s) Signed: 03/10/2022 12:49:39 PM By: [name] PA-C Entered By: [name] on 03/10/2022  12:49:38 -------------------------------------------------------------------------------- SuperBill Details Patient Name: Date of Service: [name], [name] 03/10/2022 Medical Record Number: [id number] Patient Account Number: [id number] Date of Birth/Sex: Treating RN: 1965/11/09 (57 y.o. Raymond [name] Primary Care Provider: [name] Other Clinician: Massie Kluver Referring Provider: Treating Provider/Extender: [name] Weeks in Treatment: 9 Diagnosis Coding ICD-10 Codes Code Description [phone] Chronic multifocal osteomyelitis, left ankle and foot  E11.621 Type 2 diabetes mellitus with foot ulcer L97.522 Non-pressure chronic ulcer of other part of left foot with fat layer exposed I50.42 Chronic combined systolic (congestive) and diastolic (congestive) heart failure J44.9 Chronic obstructive pulmonary disease, unspecified Facility Procedures : CPT4 Code: [id number] Description: [place] - DEB SUBQ TISSUE 20 SQ CM/< ICD-10 Diagnosis Description L97.522 Non-pressure chronic ulcer of other part of left foot with fat layer exposed Modifier: Quantity: 1 : CPT4 Code: [id number] Description: [id number] - DEBRIDE WOUND 1ST 20 SQ CM OR < ICD-10 Diagnosis Description L97.522 Non-pressure chronic ulcer of other part of left foot with fat layer exposed Modifier: Quantity: 1 Physician Procedures : CPT4 Code Description Modifier [id number] - WC PHYS SUBQ TISS 20 SQ CM ICD-10 Diagnosis Description L97.522 Non-pressure chronic ulcer of other part of left foot with fat layer exposed Quantity: 1 : [id number] - WC PHYS DEBR WO ANESTH 20 SQ CM ICD-10 Diagnosis Description L97.522 Non-pressure chronic ulcer of other part of left foot with fat layer exposed [name] ([id number]) [id number] Page 12 o Quantity: 1 f 12 Electronic Signature(s) Signed: 03/10/2022 1:05:04 PM By: [name] PA-C Entered By: [name] on 03/10/2022  13:05:03

## 2022-03-10 NOTE — Progress Notes (Addendum)
OLUMIDE, ALMONTE ([id number]) [id number] Page 1 of 2 Visit Report for 03/10/2022 HBO Details Patient Name: Date of Service: Raymond [name]. 03/10/2022 8:00 A M Medical Record Number: [id number] Patient Account Number: [id number] Date of Birth/Sex: Treating RN: 1966/01/13 (57 y.o. 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 HBO Treatment Details Treatment Number: 11 Patient Type: Outpatient Chamber Type: Monoplace Chamber Serial #: [id number] Treatment Protocol: 2.0 ATA with 90 minutes oxygen, and no air breaks Treatment Details Compression Rate Down: 1.5 psi / minute De-Compression Rate Up: 1.5 psi / minute Air breaks and breathing Decompress Decompress Compress Tx Pressure Begins Reached periods Begins Ends (leave unused spaces blank) Chamber Pressure (ATA 1 2 ------2 1 ) Clock Time (24 hr) 08:25 08:36 - - - - - - 10:09 10:20 Treatment Length: 115 (minutes) Treatment Segments: 4 Vital Signs Capillary Blood Glucose Reference Range: 80 - 120 mg / dl HBO Diabetic Blood Glucose Intervention Range: <131 mg/dl or >249 mg/dl Time Vitals Blood Respiratory Capillary Blood Glucose Pulse Action Type: Pulse: Temperature: Taken: Pressure: Rate: Glucose (mg/dl): Meter #: Oximetry (%) Taken: Pre 08:06 148/62 88 18 98.5 338 Post 10:37 140/62 68 18 98.2 Treatment Response Treatment Toleration: Well Treatment Completion Status: Treatment Completed without Adverse Event Electronic Signature(s) Signed: 03/10/2022 10:38:49 AM By: Gretta Cool, BSN, RN, CWS, Kim RN, BSN Signed: 03/10/2022 6:29:11 PM By: [name] PA-C Previous Signature: 03/10/2022 10:29:03 AM Version By: Gretta Cool, BSN, RN, CWS, Kim RN, BSN Previous Signature: 03/10/2022 8:37:18 AM Version By: Gretta Cool, BSN, RN, CWS, Kim RN, BSN Previous Signature: 03/10/2022 8:30:47 AM Version By: Gretta Cool, BSN, RN, CWS, Kim RN, BSN Entered By: Gretta Cool, BSN, RN, CWS, Kim on 03/10/2022 10:38:48 Raymond [name] ([id number]) [id number] Page 2 of 2 -------------------------------------------------------------------------------- HBO Safety Checklist Details Patient Name: Date of Service: Raymond [name]. 03/10/2022 8:00 A M Medical Record Number: [id number] Patient Account Number: [id number] Date of Birth/Sex: Treating RN: 10/10/1965 (57 y.o. [name], [name] Primary Care Taneika Choi: [name] Other Clinician: Referring Jontae Adebayo: Treating Tangee Marszalek/Extender: [name] Weeks in Treatment: 9 HBO Safety Checklist Items Safety Checklist Consent Form Signed Patient voided / foley secured and emptied When did you last eato am Last dose of injectable or oral agent pm Ostomy pouch emptied and vented if applicable NA All implantable devices assessed, documented and approved NA Intravenous access site secured and place NA Valuables secured Linens and cotton and cotton/polyester blend (less than 51% polyester) Personal oil-based products / skin lotions / body lotions removed Wigs or hairpieces removed NA Smoking or tobacco materials removed Books / newspapers / magazines / loose paper removed Cologne, aftershave, perfume and deodorant removed Jewelry removed (may wrap wedding band) Make-up removed NA Hair care products removed NA Battery operated devices (external) removed NA Heating patches and chemical warmers removed NA Titanium eyewear removed NA Nail polish cured greater than 10 hours NA Casting material cured greater than 10 hours NA Hearing aids removed NA Loose dentures or partials  removed NA Prosthetics have been removed NA Patient demonstrates correct use of air break device (if applicable) Patient concerns have been addressed Patient grounding bracelet on and cord attached to chamber Specifics for Inpatients (complete in addition to above) Medication  sheet sent with patient Intravenous medications needed or due during therapy sent with patient Drainage tubes (e.g. nasogastric tube or chest tube secured and vented) Endotracheal or Tracheotomy tube secured Cuff deflated of air and inflated with saline Airway suctioned Electronic Signature(s) Signed: 03/10/2022 8:30:24 AM By: Gretta Cool, BSN, RN, CWS, Kim RN, BSN Entered By: Gretta Cool, BSN, RN, CWS, Kim on 03/10/2022 08:30:24

## 2022-03-11 ENCOUNTER — Encounter: Payer: Medicare HMO | Admitting: Physician Assistant

## 2022-03-11 DIAGNOSIS — E11621 Type 2 diabetes mellitus with foot ulcer: Secondary | ICD-10-CM | POA: Diagnosis not present

## 2022-03-11 LAB — GLUCOSE, CAPILLARY
Glucose-Capillary: 117 mg/dL — ABNORMAL HIGH (ref 70–99)
Glucose-Capillary: 120 mg/dL — ABNORMAL HIGH (ref 70–99)
Glucose-Capillary: 145 mg/dL — ABNORMAL HIGH (ref 70–99)

## 2022-03-11 NOTE — Progress Notes (Signed)
[name], Raymond [name] ([id number]) [id number] Page 1 of 11 Visit Report for 03/10/2022 Arrival Information Details Patient Name: Date of Service: Raymond [name] 03/10/2022 11:00 A M Medical Record Number: [id number] Patient Account Number: [id number] Date of Birth/Sex: Treating RN: [birth year] (57 y.o. Raymond [name] Primary Care Yashira Offenberger: [name] Other Clinician: Massie Kluver Referring Raymond [name]: Treating Raymond [name] in Treatment: 9 Visit Information History Since Last Visit All ordered tests and consults were completed: No Patient Arrived: Ambulatory Added or deleted any medications: No Arrival Time: 11:07 Any new allergies or adverse reactions: No Transfer Assistance: None Had a fall or experienced change in No Patient Identification Verified: Yes activities of daily living that may affect Secondary Verification Process Completed: Yes risk of falls: Patient Requires Transmission-Based Precautions: No Signs or symptoms of abuse/neglect since last visito No Patient Has Alerts: Yes Hospitalized since last visit: No Patient Alerts: Type II Diabetic Implantable device outside of the clinic excluding No Aspirin 69m cellular tissue based products placed in the center ABI L >220 since last visit: Has Dressing in Place as Prescribed: Yes Pain Present Now: No Electronic Signature(s) Signed: 03/11/2022 1:48:57 PM By: VMassie KluverEntered By: VMassie Kluveron 03/10/2022 11:14:29 -------------------------------------------------------------------------------- Clinic Level of Care Assessment Details Patient Name: Date of Service: CGar Ponto2/09/2022 11:00 A M Medical Record Number: [id number] Account Number: [id number] of Birth/Sex: Treating RN: [birth year](57y.o. MVerl BlalockPrimary Care Lyfe Monger: SElisabeth CaraOther Clinician: VMassie KluverReferring  Raymond [name]: Treating Raymond [name]/Extender: SHazle Cocain Treatment: 9 Clinic Level of Care Assessment Items TOOL 1 Quantity Score []$  - 0 Use when EandM and Procedure is performed on INITIAL visit ASSESSMENTS - Nursing Assessment / Reassessment []$  - 0 General Physical Exam (combine w/ comprehensive assessment (listed just below) when performed on new pt. evals) []$  - 0 Comprehensive Assessment (HX, ROS, Risk Assessments, Wounds Hx, etc.) CAUGUSTEN, Raymond [name]([id number] Page 2 of 11 ASSESSMENTS - Wound and Skin Assessment / Reassessment []$  - 0 Dermatologic / Skin Assessment (not related to wound area) ASSESSMENTS - Ostomy and/or Continence Assessment and Care []$  - 0 Incontinence Assessment and Management []$  - 0 Ostomy Care Assessment and Management (repouching, etc.) PROCESS - Coordination of Care []$  - 0 Simple Patient / Family Education for ongoing care []$  - 0 Complex (extensive) Patient / Family Education for ongoing care []$  - 0 Staff obtains CProgrammer, systems Records, T Results / Process Orders est []$  - 0 Staff telephones HHA, Nursing Homes / Clarify orders / etc []$  - 0 Routine Transfer to another Facility (non-emergent condition) []$  - 0 Routine Hospital Admission (non-emergent condition) []$  - 0 New Admissions / IBiomedical engineer/ Ordering NPWT Apligraf, etc. , []$  - 0 Emergency Hospital Admission (emergent condition) PROCESS - Special Needs []$  - 0 Pediatric / Minor Patient Management []$  - 0 Isolation Patient Management []$  - 0 Hearing / Language / Visual special needs []$  - 0 Assessment of Community assistance (transportation, D/C planning, etc.) []$  - 0 Additional assistance / Altered mentation []$  - 0 Support Surface(s) Assessment (bed, cushion, seat, etc.) INTERVENTIONS - Miscellaneous []$  - 0 External ear exam []$  - 0 Patient Transfer (multiple staff / HCivil Service fast streamer/ Similar devices) []$  - 0 Simple  Staple / Suture removal (25 or less) []$  - 0 Complex Staple / Suture removal (26 or more) []$  - 0 Hypo/Hyperglycemic Management (do not check if billed separately) []$  - 0 Ankle /  Brachial Index (ABI) - do not check if billed separately Has the patient been seen at the hospital within the last three years: Yes Total Score: 0 Level Of Care: ____ Electronic Signature(s) Signed: 03/11/2022 1:48:57 PM By: Massie Kluver Entered By: Massie Kluver on 03/10/2022 11:38:04 -------------------------------------------------------------------------------- Encounter Discharge Information Details Patient Name: Date of Service: Raymond Eke D. 03/10/2022 11:00 A M Medical Record Number: [id number] Patient Account Number: [id number] Date of Birth/Sex: Treating RN: 24-Jul-1965 (57 y.o. Raymond [name] Primary Care Vaishnavi Dalby: [name] Other Clinician: Massie Kluver Referring Takeyah Wieman: Treating Emonni Depasquale/Extender: [name] [place] ([id number]) [phone].pdf Page 3 of 11 Weeks in Treatment: 9 Encounter Discharge Information Items Post Procedure Vitals Discharge Condition: Stable Temperature (F): 98.5 Ambulatory Status: Ambulatory Pulse (bpm): 88 Discharge Destination: Home Respiratory Rate (breaths/min): 18 Transportation: Private Auto Blood Pressure (mmHg): 148/62 Accompanied By: self Schedule Follow-up Appointment: Yes Clinical Summary of Care: Electronic Signature(s) Signed: 03/11/2022 1:48:57 PM By: Massie Kluver Entered By: Massie Kluver on 03/10/2022 11:55:47 -------------------------------------------------------------------------------- Lower Extremity Assessment Details Patient Name: Date of Service: Raymond Eke D. 03/10/2022 11:00 A M Medical Record Number: [id number] Patient Account Number: [id number] Date of Birth/Sex: Treating RN: 09-Jan-1966 (57 y.o. Raymond [name] Primary Care Donal Lynam: [name] Other Clinician: Massie Kluver Referring Lotta Frankenfield: Treating Jourdon Zimmerle/Extender: [name] Weeks in Treatment: 9 Vascular Assessment Pulses: Dorsalis Pedis Palpable: [Left:Yes] Electronic Signature(s) Signed: 03/11/2022 1:48:57 PM By: Massie Kluver Signed: 03/11/2022 1:49:05 PM By: Gretta Cool, BSN, RN, CWS, Kim RN, BSN Entered By: Massie Kluver on 03/10/2022 11:23:09 -------------------------------------------------------------------------------- Multi Wound Chart Details Patient Name: Date of Service: Raymond Eke D. 03/10/2022 11:00 A M Medical Record Number: [id number] Patient Account Number: [id number] Date of Birth/Sex: Treating RN: 02-01-1966 (57 y.o. Raymond [name] Primary Care Deaveon Schoen: [name] Other Clinician: Massie Kluver Referring Kristyanna Barcelo: Treating Kylor Valverde/Extender: Raymond [name] in Treatment: [address] Raymond [name], Raymond [name] ([id number]) [id number] Page 4 of 11 Height(in): 74 Pulse(bpm): 88 Weight(lbs): 371 Blood Pressure(mmHg): 148/62 Body Mass Index(BMI): 47.6 Temperature(F): 98.5 Respiratory Rate(breaths/min): 18 [2:Photos:] [N/A:N/A] Left, Medial Metatarsal head first Left T Third oe N/A Wound Location: Gradually Appeared Footwear Injury N/A Wounding Event: Diabetic Wound/Ulcer of the Lower Diabetic Wound/Ulcer of the Lower N/A Primary Etiology: Extremity Extremity Congestive Heart Failure, Congestive Heart Failure, N/A Comorbid History: Hypertension, Type II Diabetes, Hypertension, Type II Diabetes, Neuropathy Neuropathy 12/13/2021 01/03/2022 N/A Date Acquired: 9 9 N/A Weeks of Treatment: Open Open N/A Wound Status: No No N/A Wound Recurrence: 0.5x0.2x0.1 0.3x0.4x0.1 N/A Measurements L x W x D (cm) 0.079 0.094 N/A A (cm) : rea 0.008 0.009 N/A Volume (cm) : 93.80% 85.20% N/A % Reduction in A rea: 97.90% 92.90% N/A % Reduction in Volume: Grade 3  Grade 3 N/A Classification: Large Medium N/A Exudate A mount: Serous Serous N/A Exudate Type: amber amber N/A Exudate Color: Flat and Intact Flat and Intact N/A Wound Margin: Large (67-100%) Large (67-100%) N/A Granulation A mount: Small (1-33%) Small (1-33%) N/A Necrotic A mount: Fat Layer (Subcutaneous Tissue): Yes Fat Layer (Subcutaneous Tissue): Yes N/A Exposed Structures: Fascia: No Fascia: No Tendon: No Tendon: No Muscle: No Muscle: No Joint: No Joint: No Bone: No Bone: No None None N/A Epithelialization: Treatment Notes Electronic Signature(s) Signed: 03/11/2022 1:48:57 PM By: Massie Kluver Entered By: Massie Kluver on 03/10/2022 11:23:13 -------------------------------------------------------------------------------- [place] Details Patient Name: Date of Service: Raymond Eke D. 03/10/2022 11:00 A M Medical Record Number: [id number] Patient Account  Number: [id number] Date of Birth/Sex: Treating RN: 1965/10/20 (57 y.o. Raymond [name] Primary Care Alekhya Gravlin: [name] Other Clinician: Massie Kluver Referring Kallan Bischoff: Treating Mariany Mackintosh/Extender: Raymond [name] in Treatment: [address], [place] D ([id number]) [phone].pdf Page 5 of 11 Active Inactive Medication Nursing Diagnoses: Knowledge deficit related to medication safety: actual or potential Goals: Patient/caregiver will demonstrate understanding of all current medications Date Initiated: 01/03/2022 T arget Resolution Date: 03/11/2022 Goal Status: Active Interventions: Assess for medication contraindications each visit where new medications are prescribed Assess patient/caregiver ability to manage medication regimen upon admission and as needed Patient/Caregiver given reconciled medication list upon admission, changes in medications and discharge from the [facility] Provide education on medication safety Notes: Necrotic  Tissue Nursing Diagnoses: Impaired tissue integrity related to necrotic/devitalized tissue Knowledge deficit related to management of necrotic/devitalized tissue Goals: Necrotic/devitalized tissue will be minimized in the wound bed Date Initiated: 01/03/2022 Target Resolution Date: 03/03/2022 Goal Status: Active Patient/caregiver will verbalize understanding of reason and process for debridement of necrotic tissue Date Initiated: 01/03/2022 Target Resolution Date: 03/03/2022 Goal Status: Active Interventions: Assess patient pain level pre-, during and post procedure and prior to discharge Provide education on necrotic tissue and debridement process Treatment Activities: Excisional debridement : 01/03/2022 Notes: Pressure Nursing Diagnoses: Knowledge deficit related to causes and risk factors for pressure ulcer development Knowledge deficit related to management of pressures ulcers Potential for impaired tissue integrity related to pressure, friction, moisture, and shear Goals: Patient will remain free from development of additional pressure ulcers Date Initiated: 01/03/2022 Target Resolution Date: 03/03/2022 Goal Status: Active Patient/caregiver will verbalize risk factors for pressure ulcer development Date Initiated: 01/03/2022 Target Resolution Date: 03/03/2022 Goal Status: Active Patient/caregiver will verbalize understanding of pressure ulcer management Date Initiated: 01/03/2022 Target Resolution Date: 03/03/2022 Goal Status: Active Interventions: Assess potential for pressure ulcer upon admission and as needed Notes: Soft Tissue Infection Nursing Diagnoses: Impaired tissue integrity Knowledge deficit related to disease process and management Knowledge deficit related to home infection control: handwashing, handling of soiled dressings, supply storage Potential for infection: soft tissue Goals: Patient will remain free of wound infection Raymond [name], Raymond [name] ([id number])  [id number] Page 6 of 11 Date Initiated: 01/03/2022 Target Resolution Date: 03/03/2022 Goal Status: Active Patient/caregiver will verbalize understanding of or measures to prevent infection and contamination in the home setting Date Initiated: 01/03/2022 Target Resolution Date: 03/03/2022 Goal Status: Active Patient's soft tissue infection will resolve Date Initiated: 01/03/2022 Date Inactivated: 03/03/2022 Target Resolution Date: 02/18/2022 Goal Status: Met Signs and symptoms of infection will be recognized early to allow for prompt treatment Date Initiated: 01/03/2022 Target Resolution Date: 03/03/2022 Goal Status: Active Interventions: Assess signs and symptoms of infection every visit Treatment Activities: Culture and sensitivity : 01/03/2022 Notes: Wound/Skin Impairment Nursing Diagnoses: Impaired tissue integrity Knowledge deficit related to ulceration/compromised skin integrity Goals: Ulcer/skin breakdown will have a volume reduction of 30% by week 4 Date Initiated: 02/18/2022 Target Resolution Date: 02/18/2022 Goal Status: Active Ulcer/skin breakdown will have a volume reduction of 50% by week 8 Date Initiated: 02/18/2022 Target Resolution Date: 03/18/2022 Goal Status: Active Interventions: Assess patient/caregiver ability to obtain necessary supplies Assess ulceration(s) every visit Treatment Activities: Consult for HBO : 02/18/2022 Referred to DME Fritzi Scripter for dressing supplies : 02/18/2022 Skin care regimen initiated : 02/18/2022 Notes: Electronic Signature(s) Signed: 03/11/2022 1:48:57 PM By: Massie Kluver Signed: 03/11/2022 1:49:05 PM By: Gretta Cool, BSN, RN, CWS, Kim RN, BSN Entered By: Massie Kluver on 03/10/2022 11:54:29 -------------------------------------------------------------------------------- Pain Assessment Details Patient Name: Date of Service:  Raymond Eke D. 03/10/2022 11:00 A M Medical Record Number: [id number] Patient Account  Number: [id number] Date of Birth/Sex: Treating RN: [birth year] (57 y.o. Raymond [name] Primary Care Makyia Erxleben: [name] Other Clinician: Massie Kluver Referring Wilfredo Canterbury: Treating Avnoor Koury/Extender: [name] Weeks in Treatment: 9 Active Problems Location of Pain Severity and Description of Pain Patient Has Paino No Raymond [name], Raymond [name] ([id number]) [id number] Page 7 of 11 Patient Has Paino No Site Locations Pain Management and Medication Current Pain Management: Electronic Signature(s) Signed: 03/11/2022 1:48:57 PM By: Massie Kluver Signed: 03/11/2022 1:49:05 PM By: Gretta Cool, BSN, RN, CWS, Kim RN, BSN Entered By: Massie Kluver on 03/10/2022 11:15:01 -------------------------------------------------------------------------------- Patient/Caregiver Education Details Patient Name: Date of Service: Raymond [name] 2/8/2024andnbsp11:00 A M Medical Record Number: [id number] Patient Account Number: [id number] Date of Birth/Gender: Treating RN: 1965-12-22 (57 y.o. Raymond [name] Primary Care Physician: [name] Other Clinician: Massie Kluver Referring Physician: Treating Physician/Extender: Raymond [name] in Treatment: 9 Education Assessment Education Provided To: Patient Education Topics Provided Wound/Skin Impairment: Handouts: Other: continue wound care as directed Methods: Explain/Verbal Responses: State content correctly Electronic Signature(s) Signed: 03/11/2022 1:48:57 PM By: Massie Kluver Entered By: Massie Kluver on 03/10/2022 11:54:10 Raymond [name] ([id number]) [id number] Page 8 of 11 -------------------------------------------------------------------------------- Wound Assessment Details Patient Name: Date of Service: Raymond [name]. 03/10/2022 11:00 A M Medical Record Number: [id number] Patient Account Number: [id number] Date of Birth/Sex:  Treating RN: 25-Jun-1965 (57 y.o. Raymond [name], Raymond [name] Primary Care Gjon Letarte: [name] Other Clinician: Massie Kluver Referring Linkon Siverson: Treating Larin Depaoli/Extender: [name] Weeks in Treatment: 9 Wound Status Wound Number: 2 Primary Diabetic Wound/Ulcer of the Lower Extremity Etiology: Wound Location: Left, Medial Metatarsal head first Wound Status: Open Wounding Event: Gradually Appeared Comorbid Congestive Heart Failure, Hypertension, Type II Diabetes, Date Acquired: 12/13/2021 History: Neuropathy Weeks Of Treatment: 9 Clustered Wound: No Photos Wound Measurements Length: (cm) 0.5 Width: (cm) 0.2 Depth: (cm) 0.1 Area: (cm) 0.079 Volume: (cm) 0.008 % Reduction in Area: 93.8% % Reduction in Volume: 97.9% Epithelialization: None Wound Description Classification: Grade 3 Wound Margin: Flat and Intact Exudate Amount: Large Exudate Type: Serous Exudate Color: amber Foul Odor After Cleansing: No Slough/Fibrino Yes Wound Bed Granulation Amount: Large (67-100%) Exposed Structure Necrotic Amount: Small (1-33%) Fascia Exposed: No Necrotic Quality: Adherent Slough Fat Layer (Subcutaneous Tissue) Exposed: Yes Tendon Exposed: No Muscle Exposed: No Joint Exposed: No Bone Exposed: No Treatment Notes Wound #2 (Metatarsal head first) Wound Laterality: Left, Medial Cleanser Byram Ancillary Kit - 15 Day Supply Discharge Instruction: Use supplies as instructed; Kit contains: (15) Saline Bullets; (15) 3x3 Gauze; 15 pr Gloves Raymond [name] ([id number]) [id number] Page 9 of 11 Soap and Water Discharge Instruction: Gently cleanse wound with antibacterial soap, rinse and pat dry prior to dressing wounds Peri-Wound Care Topical Primary Dressing Endoform Natural, Non-fenestrated, 2x2 (in/in) Secondary Dressing ABD Pad 5x9 (in/in) Discharge Instruction: Cover with ABD pad Kerlix 4.5 x 4.1 (in/yd) Discharge Instruction: Apply  Kerlix 4.5 x 4.1 (in/yd) as instructed mepitel Secured With Medipore T - 31M Medipore H Soft Cloth Surgical T ape ape, 2x2 (in/yd) Steri-Strip 0.25x4 (in/in) Discharge Instruction: Apply Steri-Strip as directed Compression Wrap Compression Stockings Add-Ons Electronic Signature(s) Signed: 03/11/2022 1:48:57 PM By: Massie Kluver Signed: 03/11/2022 1:49:05 PM By: Gretta Cool, BSN, RN, CWS, Kim RN, BSN Entered By: Massie Kluver on 03/10/2022 11:21:37 -------------------------------------------------------------------------------- Wound Assessment Details Patient Name: Date of Service: Raymond Eke D. 03/10/2022 11:00 A M Medical Record  Number: [id number] Patient Account Number: [id number] Date of Birth/Sex: Treating RN: Oct 23, 1965 (57 y.o. Raymond [name] Primary Care Kanyah Matsushima: [name] Other Clinician: Massie Kluver Referring Timiya Howells: Treating Rishawn Walck/Extender: [name] Weeks in Treatment: 9 Wound Status Wound Number: 3 Primary Diabetic Wound/Ulcer of the Lower Extremity Etiology: Wound Location: Left T Third oe Wound Status: Open Wounding Event: Footwear Injury Comorbid Congestive Heart Failure, Hypertension, Type II Diabetes, Date Acquired: 01/03/2022 History: Neuropathy Weeks Of Treatment: 9 Clustered Wound: No Photos Raymond [name], Raymond [name] ([id number]) [id number] Page 10 of 11 Wound Measurements Length: (cm) 0.3 Width: (cm) 0.4 Depth: (cm) 0.1 Area: (cm) 0.094 Volume: (cm) 0.009 % Reduction in Area: 85.2% % Reduction in Volume: 92.9% Epithelialization: None Wound Description Classification: Grade 3 Wound Margin: Flat and Intact Exudate Amount: Medium Exudate Type: Serous Exudate Color: amber Foul Odor After Cleansing: No Slough/Fibrino Yes Wound Bed Granulation Amount: Large (67-100%) Exposed Structure Necrotic Amount: Small (1-33%) Fascia Exposed: No Fat Layer (Subcutaneous Tissue) Exposed: Yes Tendon  Exposed: No Muscle Exposed: No Joint Exposed: No Bone Exposed: No Treatment Notes Wound #3 (Toe Third) Wound Laterality: Left Cleanser Byram Ancillary Kit - 15 Day Supply Discharge Instruction: Use supplies as instructed; Kit contains: (15) Saline Bullets; (15) 3x3 Gauze; 15 pr Gloves Soap and Water Discharge Instruction: Gently cleanse wound with antibacterial soap, rinse and pat dry prior to dressing wounds Peri-Wound Care Topical Primary Dressing Xeroform 4x4-HBD (in/in) Discharge Instruction: Apply Xeroform 4x4-HBD (in/in) as directed Secondary Dressing Kerlix 4.5 x 4.1 (in/yd) Discharge Instruction: Apply Kerlix 4.5 x 4.1 (in/yd) as instructed Secured With Medipore T - 46M Medipore H Soft Cloth Surgical T ape ape, 2x2 (in/yd) Compression Wrap Compression Stockings Add-Ons Electronic Signature(s) Signed: 03/11/2022 1:48:57 PM By: Massie Kluver Signed: 03/11/2022 1:49:05 PM By: Gretta Cool, BSN, RN, CWS, Kim RN, BSN Entered By: Massie Kluver on 03/10/2022 11:22:39 Vitals Details -------------------------------------------------------------------------------- Raymond [name] ([id number]) [id number] Page 11 of 11 Patient Name: Date of Service: Raymond Eke D. 03/10/2022 11:00 A M Medical Record Number: [id number] Patient Account Number: [id number] Date of Birth/Sex: Treating RN: July 27, 1965 (57 y.o. Raymond [name], Raymond [name] Primary Care Maley Venezia: [name] Other Clinician: Massie Kluver Referring Kellin Bartling: Treating Iliza Blankenbeckler/Extender: [name] Weeks in Treatment: 9 Vital Signs Time Taken: 11:14 Temperature (F): 98.5 Height (in): 74 Pulse (bpm): 88 Weight (lbs): 371 Respiratory Rate (breaths/min): 18 Body Mass Index (BMI): 47.6 Blood Pressure (mmHg): 148/62 Reference Range: 80 - 120 mg / dl Electronic Signature(s) Signed: 03/11/2022 1:48:57 PM By: Massie Kluver Entered By: Massie Kluver on 03/10/2022 11:14:52

## 2022-03-11 NOTE — Progress Notes (Addendum)
CAESAR, MONGE ([id number]) [id number] Page 1 of 2 Visit Report for 03/11/2022 HBO Details Patient Name: Date of Service: Raymond [name]. 03/11/2022 8:00 A M Medical Record Number: [id number] Patient Account Number: [id number] Date of Birth/Sex: Treating RN: 02-22-1965 (57 y.o. 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 HBO Treatment Details Treatment Number: 12 Patient Type: Outpatient Chamber Type: Monoplace Chamber Serial #: [id number] Treatment Protocol: 2.0 ATA with 90 minutes oxygen, and no air breaks Treatment Details Compression Rate Down: 1.5 psi / minute De-Compression Rate Up: 1.5 psi / minute Air breaks and breathing Decompress Decompress Compress Tx Pressure Begins Reached periods Begins Ends (leave unused spaces blank) Chamber Pressure (ATA 1 2 ------2 1 ) Clock Time (24 hr) 08:26 08:38 - - - - - - 10:08 10:19 Treatment Length: 113 (minutes) Treatment Segments: 4 Vital Signs Capillary Blood Glucose Reference Range: 80 - 120 mg / dl HBO Diabetic Blood Glucose Intervention Range: <131 mg/dl or >249 mg/dl Type: Time Vitals Blood Pulse: Respiratory Temperature: Capillary Blood Glucose Pulse Action Taken: Pressure: Rate: Glucose (mg/dl): Meter #: Oximetry (%) Taken: Pre 08:02 128/78 76 18 98.2 117 Ensure given to patient, recheck in 30 minutes Pre 120 PA notified, treatment initiated Post 10:22 140/68 78 18 98.2 145 discharged home Treatment Response Treatment Toleration: Well Treatment Completion Status: Treatment Completed without Adverse Event Electronic Signature(s) Signed:  03/11/2022 10:42:16 AM By: Gretta Cool, BSN, RN, CWS, Kim RN, BSN Signed: 03/11/2022 2:35:56 PM By: [name] PA-C Previous Signature: 03/11/2022 10:19:46 AM Version By: Gretta Cool, BSN, RN, CWS, Kim RN, BSN Previous Signature: 03/11/2022 10:11:48 AM Version By: Gretta Cool, BSN, RN, CWS, Kim RN, BSN Previous Signature: 03/11/2022 8:43:21 AM Version By: Gretta Cool, BSN, RN, CWS, Kim RN, BSN Previous Signature: 03/11/2022 8:41:49 AM Version By: Gretta Cool, BSN, RN, CWS, Kim RN, BSN Entered By: Gretta Cool, BSN, RN, CWS, Kim on 03/11/2022 10:42:16 Raymond [name] ([id number]) [id number] Page 2 of 2 -------------------------------------------------------------------------------- HBO Safety Checklist Details Patient Name: Date of Service: Raymond [name]. 03/11/2022 8:00 A M Medical Record Number: [id number] Patient Account Number: [id number] Date of Birth/Sex: Treating RN: [birth year] (57 y.o. [name], [name] Primary Care [name]: [name] Other Clinician: Referring [name]: Treating Kalise Fickett/Extender: [name] Weeks in Treatment: 9 HBO Safety Checklist Items Safety Checklist Consent Form Signed Patient voided / foley secured and emptied When did you last eato am Last dose of injectable or oral agent pm Ostomy pouch emptied and vented if applicable NA All implantable devices assessed, documented and approved NA Intravenous access site secured and place NA Valuables secured Linens and cotton and cotton/polyester blend (less than 51% polyester) Personal oil-based products / skin lotions / body lotions removed Wigs or hairpieces removed NA Smoking or tobacco materials removed NA Books / newspapers / magazines / loose paper removed NA Cologne, aftershave, perfume and deodorant removed Jewelry removed (may wrap wedding band) NA Make-up removed NA Hair care products removed Battery operated devices (external) removed NA Heating patches and chemical warmers  removed NA Titanium eyewear removed NA Nail polish cured greater than 10 hours NA Casting material cured greater than 10 hours NA Hearing aids removed NA Loose dentures or partials removed NA Prosthetics have been removed NA  Patient demonstrates correct use of air break device (if applicable) Patient concerns have been addressed Patient grounding bracelet on and cord attached to chamber Specifics for Inpatients (complete in addition to above) Medication sheet sent with patient Intravenous medications needed or due during therapy sent with patient Drainage tubes (e.g. nasogastric tube or chest tube secured and vented) Endotracheal or Tracheotomy tube secured Cuff deflated of air and inflated with saline Airway suctioned Electronic Signature(s) Signed: 03/11/2022 8:38:27 AM By: Gretta Cool, BSN, RN, CWS, Kim RN, BSN Entered By: Gretta Cool, BSN, RN, CWS, Kim on 03/11/2022 [id number]

## 2022-03-11 NOTE — Progress Notes (Addendum)
AGRON, NICHOLES ([id number]) [id number] Page 1 of 2 Visit Report for 03/11/2022 Arrival Information Details Patient Name: Date of Service: Raymond [name]. 03/11/2022 8:00 A M Medical Record Number: [id number] Patient Account Number: [id number] Date of Birth/Sex: Treating RN: 01-17-1966 (57 y.o. [name] Primary Care Limuel Nieblas: [name] Other Clinician: Referring Jaxsen Bernhart: Treating Macauley Mossberg/Extender: Hazle Coca in Treatment: 9 Visit Information History Since Last Visit Added or deleted any medications: No Patient Arrived: Ambulatory Has Dressing in Place as Prescribed: Yes Arrival Time: 07:50 Pain Present Now: No Transfer Assistance: None Patient Identification Verified: Yes Secondary Verification Process Completed: Yes Patient Requires Transmission-Based Precautions: No Patient Has Alerts: Yes Patient Alerts: Type II Diabetic Aspirin 10m ABI L [place]>220 Electronic Signature(s) Signed: 03/11/2022 8:36:17 AM By: WGretta Cool BSN, RN, CWS, Kim RN, BSN Entered By: WGretta Cool BSN, RN, CWS, Kim on 03/11/2022 08:36:16 -------------------------------------------------------------------------------- Encounter Discharge Information Details Patient Name: Date of Service: [name] 03/11/2022 8:00 A M Medical Record Number: [id number] Account Number: [id number] of Birth/Sex: Treating RN: [birth year](57y.o. MIsac Sarna KMaudie MercuryPrimary Care Sentoria Brent: SElisabeth CaraOther Clinician: Referring Darrol Brandenburg: Treating Jayjay Littles/Extender: SHazle Cocain Treatment: 9 Encounter Discharge Information Items Discharge Condition: Stable Ambulatory Status: Ambulatory Discharge Destination: Home Transportation: Private Auto Schedule Follow-up Appointment: Yes Clinical Summary of Care: Electronic Signature(s) Signed: 03/11/2022 10:43:17 AM By: WGretta Cool BSN, RN, CWS, Kim RN, BSN [place]  ([id number] Page 2 of 2 Entered By: WGretta Cool BSN, RN, CWS, Kim on 03/11/2022 10:43:17 -------------------------------------------------------------------------------- Vitals Details Patient Name: Date of Service: [name] 03/11/2022 8:00 A M Medical Record Number: [id number] Account Number: [id number] of Birth/Sex: Treating RN: [birth year](57y.o. MIsac Sarna KMaudie MercuryPrimary Care Opel Lejeune: SElisabeth CaraOther Clinician: Referring Camie Hauss: Treating Monic Engelmann/Extender: SHazle Cocain Treatment: 9 Vital Signs Time Taken: 08:02 Temperature (F): 98.2 Height (in): 74 Pulse (bpm): 76 Weight (lbs): 371 Respiratory Rate (breaths/min): 18 Body Mass Index (BMI): 47.6 Blood Pressure (mmHg): 128/78 Capillary Blood Glucose (mg/dl): 117 Reference Range: 80 - 120 mg / dl Electronic Signature(s) Signed: 03/11/2022 8:36:45 AM By: WGretta Cool BSN, RN, CWS, Kim RN, BSN Entered By: WGretta Cool BSN, RN, CWS, Kim on 03/11/2022 08:36:45

## 2022-03-14 ENCOUNTER — Encounter: Payer: Medicare HMO | Admitting: Physician Assistant

## 2022-03-14 DIAGNOSIS — E11621 Type 2 diabetes mellitus with foot ulcer: Secondary | ICD-10-CM | POA: Diagnosis not present

## 2022-03-14 LAB — GLUCOSE, CAPILLARY
Glucose-Capillary: 146 mg/dL — ABNORMAL HIGH (ref 70–99)
Glucose-Capillary: 153 mg/dL — ABNORMAL HIGH (ref 70–99)

## 2022-03-14 NOTE — Progress Notes (Addendum)
MARTEZ, WURZER ([id number]) [id number] Page 1 of 2 Visit Report for 03/14/2022 HBO Details Patient Name: Date of Service: Raymond [name]. 03/14/2022 8:00 A M Medical Record Number: [id number] Patient Account Number: [id number] Date of Birth/Sex: Treating RN: [birth year] (57 y.o. 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 HBO Treatment Details Treatment Number: 13 Patient Type: Outpatient Chamber Type: Monoplace Chamber Serial #: [id number] Treatment Protocol: 2.0 ATA with 90 minutes oxygen, and no air breaks Treatment Details Compression Rate Down: 1.5 psi / minute De-Compression Rate Up: 1.5 psi / minute Air breaks and breathing Decompress Decompress Compress Tx Pressure Begins Reached periods Begins Ends (leave unused spaces blank) Chamber Pressure (ATA 1 2 ------2 1 ) Clock Time (24 hr) 08:17 08:29 - - - - - - 09:59 10:11 Treatment Length: 114 (minutes) Treatment Segments: 4 Vital Signs Capillary Blood Glucose Reference Range: 80 - 120 mg / dl HBO Diabetic Blood Glucose Intervention Range: <131 mg/dl or >249 mg/dl Time Vitals Blood Respiratory Capillary Blood Glucose Pulse Action Type: Pulse: Temperature: Taken: Pressure: Rate: Glucose (mg/dl): Meter #: Oximetry (%) Taken: Pre 07:44 142/68 80 18 98.6 146 Post 10:12 143/68 80 18 98.3 153 Treatment Response Treatment Toleration: Well Treatment Completion Status: Treatment Completed without Adverse Event HBO Attestation I certify that I supervised this HBO treatment in accordance with Medicare guidelines. A trained emergency  response team is readily available per Yes hospital policies and procedures. Continue HBOT as ordered. Yes Electronic Signature(s) Signed: 03/14/2022 10:44:52 AM By: Raymond [name], BSN, RN, CWS, Kim RN, BSN Signed: 03/14/2022 11:36:55 AM By: [name] PA-C Previous Signature: 03/14/2022 10:44:40 AM Version By: Raymond [name] BSN, RN, CWS, Kim RN, BSN Previous Signature: 03/14/2022 10:44:19 AM Version By: Raymond [name] BSN, RN, CWS, Kim RN, BSN [place], [place] D ([id number]) [id number] Page 2 of 2 Previous Signature: 03/14/2022 10:44:19 AM Version By: Raymond [name], BSN, RN, CWS, Kim RN, BSN Previous Signature: 03/14/2022 10:11:16 AM Version By: Raymond [name], BSN, RN, CWS, Kim RN, BSN Previous Signature: 03/14/2022 10:00:37 AM Version By: Raymond [name] BSN, RN, CWS, Kim RN, BSN Previous Signature: 03/14/2022 8:30:06 AM Version By: Raymond [name], BSN, RN, CWS, Kim RN, BSN Entered By: Raymond [name], BSN, RN, CWS, Raymond [name] on 03/14/2022 10:44:52 -------------------------------------------------------------------------------- HBO Safety Checklist Details Patient Name: Date of Service: Raymond [name], Raymond Negus D. 03/14/2022 8:00 A M Medical Record Number: [id number] Patient Account Number: [id number] Date of Birth/Sex: Treating RN: 12-24-1965 (57 y.o. Raymond [name], Raymond [name] Primary Care Shaniya Tashiro: [name] Other Clinician: Referring Raymond [name]: Treating Raymond [name]/Extender: Raymond [name] in Treatment: 10 HBO Safety Checklist Items Safety Checklist Consent Form Signed Patient voided / foley secured and emptied When did you last eato am Last dose of injectable or oral agent pm Ostomy pouch emptied and vented if applicable NA All implantable devices assessed, documented and approved NA Intravenous access site secured and place NA Valuables secured Linens and cotton and cotton/polyester blend (less than 51% polyester) Personal oil-based products / skin lotions / body lotions removed Wigs or hairpieces  removed NA Smoking or tobacco materials removed NA Books / newspapers / magazines / loose paper removed NA Cologne, aftershave, perfume and deodorant removed Jewelry removed (may wrap wedding band) NA Make-up removed NA Hair care products removed Battery operated  devices (external) removed NA Heating patches and chemical warmers removed NA Titanium eyewear removed NA Nail polish cured greater than 10 hours NA Casting material cured greater than 10 hours NA Hearing aids removed NA Loose dentures or partials removed NA Prosthetics have been removed NA Patient demonstrates correct use of air break device (if applicable) Patient concerns have been addressed Patient grounding bracelet on and cord attached to chamber Specifics for Inpatients (complete in addition to above) Medication sheet sent with patient Intravenous medications needed or due during therapy sent with patient Drainage tubes (e.g. nasogastric tube or chest tube secured and vented) Endotracheal or Tracheotomy tube secured Cuff deflated of air and inflated with saline Airway suctioned Electronic Signature(s) Signed: 03/14/2022 7:59:52 AM By: Raymond [name], BSN, RN, CWS, Kim RN, BSN Entered By: Raymond [name], BSN, RN, CWS, Raymond [name] on 03/14/2022 07:59:51

## 2022-03-14 NOTE — Progress Notes (Addendum)
Raymond [name], Raymond [name] ([id number]) [id number] Page 1 of 2 Visit Report for 03/14/2022 Arrival Information Details Patient Name: Date of Service: [name]. 03/14/2022 8:00 A M Medical Record Number: [id number] Patient Account Number: [id number] Date of Birth/Sex: Treating RN: [birth year] (57 y.o. Raymond [name] Primary Care Anuoluwapo Mefferd: [name] Other Clinician: Referring Beryle Bagsby: Treating Ryllie Nieland/Extender: Hazle Coca in Treatment: 10 Visit Information History Since Last Visit Has Dressing in Place as Prescribed: Yes Patient Arrived: Ambulatory Pain Present Now: No Arrival Time: 07:44 Accompanied By: self Transfer Assistance: None Patient Identification Verified: Yes Secondary Verification Process Completed: Yes Patient Requires Transmission-Based Precautions: No Patient Has Alerts: Yes Patient Alerts: Type II Diabetic Aspirin 20m ABI L [place]>220 Electronic Signature(s) Signed: 03/14/2022 7:57:23 AM By: WGretta Cool BSN, RN, CWS, Kim RN, BSN Entered By: WGretta Cool BSN, RN, CWS, Kim on 03/14/2022 07:57:23 -------------------------------------------------------------------------------- Encounter Discharge Information Details Patient Name: Date of Service: [name] 03/14/2022 8:00 A M Medical Record Number: [id number] Account Number: [id number] of Birth/Sex: Treating RN: [birth year](57y.o. MIsac Sarna KMaudie MercuryPrimary Care Lashanna Angelo: SElisabeth CaraOther Clinician: Referring Marlee Armenteros: Treating Frankey Botting/Extender: SHazle Cocain Treatment: 10 Encounter Discharge Information Items Discharge Condition: Stable Ambulatory Status: Ambulatory Discharge Destination: Home Transportation: Private Auto Accompanied By: self Schedule Follow-up Appointment: Yes Clinical Summary of Care: [name], LOUGHNER([id number] Page 2 of 2 Electronic  Signature(s) Signed: 03/14/2022 10:45:06 AM By: WGretta Cool BSN, RN, CWS, Kim RN, BSN Entered By: WGretta Cool BSN, RN, CWS, Kim on 03/14/2022 10:45:06 -------------------------------------------------------------------------------- Vitals Details Patient Name: Date of Service: [name] 03/14/2022 8:00 A M Medical Record Number: [id number] Account Number: [id number] of Birth/Sex: Treating RN: 706/02/1965(57y.o. MIsac Sarna KMaudie MercuryPrimary Care Macy Polio: SElisabeth CaraOther Clinician: Referring German Manke: Treating Aron Inge/Extender: SHazle Cocain Treatment: 10 Vital Signs Time Taken: 07:44 Temperature (F): 98.6 Height (in): 74 Pulse (bpm): 80 Weight (lbs): 371 Respiratory Rate (breaths/min): 18 Body Mass Index (BMI): 47.6 Blood Pressure (mmHg): 142/68 Capillary Blood Glucose (mg/dl): 146 Reference Range: 80 - 120 mg / dl Electronic Signature(s) Signed: 03/14/2022 7:58:06 AM By: WGretta Cool BSN, RN, CWS, Kim RN, BSN Entered By: WGretta Cool BSN, RN, CWS, Kim on 03/14/2022 07:58:05

## 2022-03-15 ENCOUNTER — Encounter: Payer: Medicare HMO | Admitting: Physician Assistant

## 2022-03-15 DIAGNOSIS — E11621 Type 2 diabetes mellitus with foot ulcer: Secondary | ICD-10-CM | POA: Diagnosis not present

## 2022-03-15 LAB — GLUCOSE, CAPILLARY
Glucose-Capillary: 161 mg/dL — ABNORMAL HIGH (ref 70–99)
Glucose-Capillary: 204 mg/dL — ABNORMAL HIGH (ref 70–99)

## 2022-03-15 NOTE — Progress Notes (Signed)
RAHKEEM, TORBECK ([id number]) [id number] Page 1 of 2 Visit Report for 03/15/2022 HBO Details Patient Name: Date of Service: Raymond [name]. 03/15/2022 8:00 A M Medical Record Number: [id number] Patient Account Number: [id number] Date of Birth/Sex: Treating RN: 27-Mar-1965 (57 y.o. 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 HBO Treatment Details Treatment Number: 14 Patient Type: Outpatient Chamber Type: Monoplace Chamber Serial #: [id number] Treatment Protocol: 2.0 ATA with 90 minutes oxygen, and no air breaks Treatment Details Compression Rate Down: 1.5 psi / minute De-Compression Rate Up: 1.5 psi / minute Air breaks and breathing Decompress Decompress Compress Tx Pressure Begins Reached periods Begins Ends (leave unused spaces blank) Chamber Pressure (ATA 1 2 ------2 1 ) Clock Time (24 hr) 08:10 08:23 - - - - - - 09:54 10:06 Treatment Length: 116 (minutes) Treatment Segments: 4 Vital Signs Capillary Blood Glucose Reference Range: 80 - 120 mg / dl HBO Diabetic Blood Glucose Intervention Range: <131 mg/dl or >249 mg/dl Type: Time Vitals Blood Respiratory Capillary Blood Glucose Pulse Action Pulse: Temperature: Taken: Pressure: Rate: Glucose (mg/dl): Meter #: Oximetry (%) Taken: Pre 08:24 142/60 78 18 98.4 161 treatment initiated Post 10:05 148/72 80 18 98.2 Treatment Response Treatment Toleration: Well HBO Attestation I certify that I supervised this HBO treatment in accordance with Medicare guidelines. A trained emergency response team is readily available per Yes hospital policies  and procedures. Continue HBOT as ordered. Yes Electronic Signature(s) Signed: 03/15/2022 10:24:24 AM By: Gretta Cool, BSN, RN, CWS, Kim RN, BSN Signed: 03/15/2022 5:36:51 PM By: [name] PA-C Previous Signature: 03/15/2022 10:04:39 AM Version By: Gretta Cool BSN, RN, CWS, Kim RN, BSN Previous Signature: 03/15/2022 8:26:48 AM Version By: Gretta Cool, BSN, RN, CWS, Kim RN, BSN Entered By: Gretta Cool, BSN, RN, CWS, Kim on 03/15/2022 10:24:23 Raymond [name] ([id number]) [id number] Page 2 of 2 -------------------------------------------------------------------------------- HBO Safety Checklist Details Patient Name: Date of Service: Raymond [name]. 03/15/2022 8:00 A M Medical Record Number: [id number] Patient Account Number: [id number] Date of Birth/Sex: Treating RN: Mar 31, 1965 (57 y.o. [name], [name] Primary Care Modene Andy: [name] Other Clinician: Referring Kristyanna Barcelo: Treating Leanthony Rhett/Extender: [name] Weeks in Treatment: 10 HBO Safety Checklist Items Safety Checklist Consent Form Signed Patient voided / foley secured and emptied When did you last eato am Last dose of injectable or oral agent pm Ostomy pouch emptied and vented if applicable NA All implantable devices assessed, documented and approved NA Intravenous access site secured and place NA Valuables secured Linens and cotton and cotton/polyester blend (less than 51% polyester) Personal oil-based products / skin lotions / body lotions removed Wigs or hairpieces removed NA Smoking or tobacco materials removed NA Books / newspapers / magazines / loose paper removed NA Cologne, aftershave, perfume and deodorant removed Jewelry removed (may wrap wedding band) NA Make-up removed NA Hair care products removed Battery operated devices (external) removed NA Heating patches and chemical warmers removed NA Titanium eyewear removed NA Nail polish cured greater than 10  hours NA Casting material cured greater than 10 hours NA Hearing aids removed NA Loose dentures or partials removed NA Prosthetics have been removed NA Patient demonstrates correct use of air break device (if applicable) Patient concerns have been addressed Patient grounding  bracelet on and cord attached to chamber Specifics for Inpatients (complete in addition to above) Medication sheet sent with patient Intravenous medications needed or due during therapy sent with patient Drainage tubes (e.g. nasogastric tube or chest tube secured and vented) Endotracheal or Tracheotomy tube secured Cuff deflated of air and inflated with saline Airway suctioned Electronic Signature(s) Signed: 03/15/2022 8:26:20 AM By: Gretta Cool, BSN, RN, CWS, Kim RN, BSN Entered By: Gretta Cool, BSN, RN, CWS, Kim on 03/15/2022 08:26:20

## 2022-03-15 NOTE — Progress Notes (Signed)
IZEAR, IFFLAND ([id number]) [id number] Page 1 of 2 Visit Report for 03/15/2022 Arrival Information Details Patient Name: Date of Service: Raymond [name]. 03/15/2022 8:00 A M Medical Record Number: [id number] Patient Account Number: [id number] Date of Birth/Sex: Treating [name]: [birth year] (57 y.o. Raymond [name] Primary Care Trigg Delarocha: [name] Other Clinician: Referring Tamyra Fojtik: Treating Kavir Savoca/Extender: Hazle Coca in Treatment: 10 Visit Information History Since Last Visit Added or deleted any medications: No Patient Arrived: Ambulatory Has Dressing in Place as Prescribed: Yes Arrival Time: 07:55 Pain Present Now: No Transfer Assistance: None Patient Identification Verified: Yes Secondary Verification Process Completed: Yes Patient Requires Transmission-Based Precautions: No Patient Has Alerts: Yes Patient Alerts: Type II Diabetic Aspirin 2m ABI L [place]>220 Electronic Signature(s) Signed: 03/15/2022 8:17:48 AM By: WGretta Cool BSN, [name], Raymond [name], Raymond [name], Raymond [name] Entered By: WGretta Cool BSN, [name], Raymond [name], Raymond on 03/15/2022 08:17:47 -------------------------------------------------------------------------------- Encounter Discharge Information Details Patient Name: Date of Service: [name] 03/15/2022 8:00 A M Medical Record Number: [id number] Account Number: [id number] of Birth/Sex: Treating [name]: 7Nov 17, 1967(57y.o. MIsac [name] KMaudie MercuryPrimary Care Omah Dewalt: SElisabeth CaraOther Clinician: Referring Tyriq Moragne: Treating Kamarion Zagami/Extender: SHazle Cocain Treatment: 10 Encounter Discharge Information Items Discharge Condition: Stable Ambulatory Status: Ambulatory Discharge Destination: Home Transportation: Private Auto Accompanied By: self Schedule Follow-up Appointment: Yes Clinical Summary of Care: Electronic Signature(s) Raymond [name], Raymond [name]([id number]  [id number] Page 2 of 2 Signed: 03/15/2022 10:25:14 AM By: WGretta Cool BSN, [name], Raymond [name], Raymond [name], Raymond [name] Entered By: WGretta Cool BSN, [name], Raymond [name], Raymond on 03/15/2022 10:25:14 -------------------------------------------------------------------------------- Vitals Details Patient Name: Date of Service: [name] 03/15/2022 8:00 A M Medical Record Number: [id number] Account Number: [id number] of Birth/Sex: Treating [name]: [birth year](57y.o. MIsac [name] KMaudie MercuryPrimary Care Camesha Farooq: SElisabeth CaraOther Clinician: Referring Rachelle Edwards: Treating Nakita Santerre/Extender: SHazle Cocain Treatment: 10 Vital Signs Time Taken: 08:24 Temperature (F): 98.4 Height (in): 74 Pulse (bpm): 78 Weight (lbs): 371 Respiratory Rate (breaths/min): 18 Body Mass Index (BMI): 47.6 Blood Pressure (mmHg): 142/60 Capillary Blood Glucose (mg/dl): 161 Reference Range: 80 - 120 mg / dl Electronic Signature(s) Signed: 03/15/2022 8:25:11 AM By: WGretta Cool BSN, [name], Raymond [name], Raymond [name], Raymond [name] Entered By: WGretta Cool BSN, [name], Raymond [name], Raymond on 03/15/2022 08:25:11

## 2022-03-16 ENCOUNTER — Encounter (HOSPITAL_BASED_OUTPATIENT_CLINIC_OR_DEPARTMENT_OTHER): Payer: Medicare HMO | Admitting: Internal Medicine

## 2022-03-16 DIAGNOSIS — L97522 Non-pressure chronic ulcer of other part of left foot with fat layer exposed: Secondary | ICD-10-CM | POA: Diagnosis not present

## 2022-03-16 DIAGNOSIS — M86372 Chronic multifocal osteomyelitis, left ankle and foot: Secondary | ICD-10-CM

## 2022-03-16 DIAGNOSIS — E11621 Type 2 diabetes mellitus with foot ulcer: Secondary | ICD-10-CM

## 2022-03-16 LAB — GLUCOSE, CAPILLARY
Glucose-Capillary: 180 mg/dL — ABNORMAL HIGH (ref 70–99)
Glucose-Capillary: 185 mg/dL — ABNORMAL HIGH (ref 70–99)

## 2022-03-16 NOTE — Progress Notes (Signed)
RAYON, RUDKIN ([id number]) [id number] Page 1 of 2 Visit Report for 03/16/2022 HBO Details Patient Name: Date of Service: Raymond Eke D. 03/16/2022 8:00 A M Medical Record Number: [id number] Patient Account Number: [id number] Date of Birth/Sex: Treating RN: 01-02-1966 (57 y.o. 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 HBO Treatment Details Treatment Number: 15 Patient Type: Outpatient Chamber Type: Monoplace Chamber Serial #: [id number] Treatment Protocol: 2.0 ATA with 90 minutes oxygen, and no air breaks Treatment Details Compression Rate Down: 1.5 psi / minute De-Compression Rate Up: 1.5 psi / minute Air breaks and breathing Decompress Decompress Compress Tx Pressure Begins Reached periods Begins Ends (leave unused spaces blank) Chamber Pressure (ATA 1 2 ------2 1 ) Clock Time (24 hr) 08:41 08:53 - - - - - - 10:24 10:34 Treatment Length: 113 (minutes) Treatment Segments: 4 Vital Signs Capillary Blood Glucose Reference Range: 80 - 120 mg / dl HBO Diabetic Blood Glucose Intervention Range: <131 mg/dl or >249 mg/dl Type: Time Vitals Blood Respiratory Capillary Blood Glucose Pulse Action Pulse: Temperature: Taken: Pressure: Rate: Glucose (mg/dl): Meter #: Oximetry (%) Taken: Pre 08:08 143/60 78 18 98.5 180 treatment initiated Post 10:40 148/60 74 18 98.2 185 discharged home Treatment Response Treatment Toleration: Well Treatment Completion Status: Treatment Completed without Adverse Event HBO Attestation I certify that I supervised this HBO treatment in accordance with  Medicare guidelines. A trained emergency response team is readily available per Yes hospital policies and procedures. Continue HBOT as ordered. Yes Electronic Signature(s) Signed: 03/16/2022 3:27:54 PM By: [name] DO Previous Signature: 03/16/2022 11:01:03 AM Version By: Gretta Cool BSN, RN, CWS, Kim RN, BSN Previous Signature: 03/16/2022 12:00:48 PM Version By: [name] DO Previous Signature: 03/16/2022 10:25:27 AM Version By: Gretta Cool BSN, RN, CWS, Kim RN, BSN Previous Signature: 03/16/2022 9:00:10 AM Version By: Gretta Cool BSN, RN, CWS, Kim RN, BSN [place], [place] D ([id number]) [id number] Page 2 of 2 Previous Signature: 03/16/2022 9:00:10 AM Version By: Gretta Cool, BSN, RN, CWS, Kim RN, BSN Entered By: [name] on 03/16/2022 15:27:25 -------------------------------------------------------------------------------- HBO Safety Checklist Details Patient Name: Date of Service: Raymond [name], Raymond Negus D. 03/16/2022 8:00 A M Medical Record Number: [id number] Patient Account Number: [id number] Date of Birth/Sex: Treating RN: 01-30-1966 (57 y.o. [name], [name] Primary Care Raef Sprigg: [name] Other Clinician: Referring Murielle Stang: Treating Nellene Courtois/Extender: [name] in Treatment: 10 HBO Safety Checklist Items Safety Checklist Consent Form Signed Patient voided / foley secured and emptied When did you last eato am Last dose of injectable or oral agent pm Ostomy pouch emptied and vented if applicable NA All implantable devices assessed, documented and approved NA Intravenous access site secured and place NA Valuables secured Linens and cotton and cotton/polyester blend (less than 51% polyester) Personal oil-based products / skin lotions / body lotions removed Wigs or hairpieces removed NA Smoking or tobacco materials removed Books / newspapers / magazines / loose paper removed NA Cologne, aftershave, perfume and  deodorant removed Jewelry removed (may wrap wedding band) NA Make-up removed NA Hair care products removed Battery operated devices (external) removed NA Heating patches and chemical warmers removed NA Titanium eyewear removed NA Nail polish cured greater than 10 hours NA Casting material cured greater than 10 hours NA  Hearing aids removed NA Loose dentures or partials removed NA Prosthetics have been removed NA Patient demonstrates correct use of air break device (if applicable) Patient concerns have been addressed Patient grounding bracelet on and cord attached to chamber Specifics for Inpatients (complete in addition to above) Medication sheet sent with patient Intravenous medications needed or due during therapy sent with patient Drainage tubes (e.g. nasogastric tube or chest tube secured and vented) Endotracheal or Tracheotomy tube secured Cuff deflated of air and inflated with saline Airway suctioned Electronic Signature(s) Signed: 03/16/2022 8:59:23 AM By: Gretta Cool, BSN, RN, CWS, Kim RN, BSN Entered By: Gretta Cool, BSN, RN, CWS, Kim on 03/16/2022 08:59:23

## 2022-03-16 NOTE — Progress Notes (Signed)
LOAY, ILES ([id number]) [id number] Page 1 of 2 Visit Report for 03/16/2022 Arrival Information Details Patient Name: Date of Service: [name]. 03/16/2022 8:00 A M Medical Record Number: [id number] Patient Account Number: [id number] Date of Birth/Sex: Treating RN: 19-Sep-1965 (57 y.o. [name] Primary Care Tienna Bienkowski: [name] Other Clinician: Referring Uzziel Russey: Treating Georgenia Salim/Extender: [name] in Treatment: 10 Visit Information History Since Last Visit Pain Present Now: No Patient Arrived: Ambulatory Arrival Time: 08:00 Transfer Assistance: None Patient Identification Verified: Yes Secondary Verification Process Completed: Yes Patient Requires Transmission-Based Precautions: No Patient Has Alerts: Yes Patient Alerts: Type II Diabetic Aspirin 49m ABI L [place]>220 Electronic Signature(s) Signed: 03/16/2022 8:58:04 AM By: WGretta Cool BSN, RN, CWS, Kim RN, BSN Entered By: WGretta Cool BSN, RN, CWS, Kim on 03/16/2022 08:58:04 -------------------------------------------------------------------------------- Encounter Discharge Information Details Patient Name: Date of Service: [name] 03/16/2022 8:00 A M Medical Record Number: [id number] Account Number: [id number] of Birth/Sex: Treating RN: 712/29/1967(57y.o. MIsac Sarna KMaudie MercuryPrimary Care Brelynn Wheller: SElisabeth CaraOther Clinician: Referring Demone Lyles: Treating Atom Solivan/Extender: HMurtis Sinkin Treatment: 10 Encounter Discharge Information Items Discharge Condition: Stable Ambulatory Status: Ambulatory Discharge Destination: Home Transportation: Private Auto Accompanied By: self Schedule Follow-up Appointment: Yes Clinical Summary of Care: Electronic Signature(s) CJOSHAWA, LAPIETRA([id number] Page 2 of 2 Signed: 03/16/2022 11:02:02 AM By: WGretta Cool BSN, RN,  CWS, Kim RN, BSN Entered By: WGretta Cool BSN, RN, CWS, Kim on 03/16/2022 11:02:02 -------------------------------------------------------------------------------- Vitals Details Patient Name: Date of Service: [name] 03/16/2022 8:00 A M Medical Record Number: [id number] Account Number: [id number] of Birth/Sex: Treating RN: [birth year](57y.o. MIsac Sarna KMaudie MercuryPrimary Care Lilienne Weins: SElisabeth CaraOther Clinician: Referring Jessiah Steinhart: Treating Atheena Spano/Extender: HMurtis Sinkin Treatment: 10 Vital Signs Time Taken: 08:08 Temperature (F): 98.5 Height (in): 74 Pulse (bpm): 78 Weight (lbs): 371 Respiratory Rate (breaths/min): 18 Body Mass Index (BMI): 47.6 Blood Pressure (mmHg): 143/60 Capillary Blood Glucose (mg/dl): 180 Reference Range: 80 - 120 mg / dl Electronic Signature(s) Signed: 03/16/2022 8:58:29 AM By: WGretta Cool BSN, RN, CWS, Kim RN, BSN Entered By: WGretta Cool BSN, RN, CWS, Kim on 03/16/2022 08:58:29

## 2022-03-17 ENCOUNTER — Encounter: Payer: Medicare HMO | Admitting: Physician Assistant

## 2022-03-17 DIAGNOSIS — E11621 Type 2 diabetes mellitus with foot ulcer: Secondary | ICD-10-CM | POA: Diagnosis not present

## 2022-03-17 LAB — GLUCOSE, CAPILLARY
Glucose-Capillary: 174 mg/dL — ABNORMAL HIGH (ref 70–99)
Glucose-Capillary: 189 mg/dL — ABNORMAL HIGH (ref 70–99)

## 2022-03-18 ENCOUNTER — Encounter: Payer: Medicare HMO | Admitting: Physician Assistant

## 2022-03-18 DIAGNOSIS — E11621 Type 2 diabetes mellitus with foot ulcer: Secondary | ICD-10-CM | POA: Diagnosis not present

## 2022-03-18 LAB — GLUCOSE, CAPILLARY
Glucose-Capillary: 174 mg/dL — ABNORMAL HIGH (ref 70–99)
Glucose-Capillary: 171 mg/dL — ABNORMAL HIGH (ref 70–99)

## 2022-03-18 NOTE — Progress Notes (Signed)
BUTCH, CRADER ([id number]) [id number] Page 1 of 2 Visit Report for 03/17/2022 Arrival Information Details Patient Name: Date of Service: Raymond [name]. 03/17/2022 8:00 A M Medical Record Number: [id number] Patient Account Number: [id number] Date of Birth/Sex: Treating RN: 11-02-1965 (57 y.o. [name] Primary Care Chane Cowden: [name] Other Clinician: Referring Zailynn Brandel: Treating Cieanna Stormes/Extender: Hazle Coca in Treatment: 10 Visit Information History Since Last Visit Has Dressing in Place as Prescribed: Yes Patient Arrived: Ambulatory Pain Present Now: No Arrival Time: 07:45 Accompanied By: self Transfer Assistance: None Patient Identification Verified: Yes Secondary Verification Process Completed: Yes Patient Requires Transmission-Based Precautions: No Patient Has Alerts: Yes Patient Alerts: Type II Diabetic Aspirin 39m ABI L [place]>220 Electronic Signature(s) Signed: 03/17/2022 8:22:24 AM By: WGretta Cool BSN, RN, CWS, Kim RN, BSN Entered By: WGretta Cool BSN, RN, CWS, Kim on 03/17/2022 [id number]-------------------------------------------------------------------------------- Encounter Discharge Information Details Patient Name: Date of Service: [name] 03/17/2022 8:00 A M Medical Record Number: [id number] Account Number: [id number] of Birth/Sex: Treating RN: 7October 23, 1967(57y.o. MVerl BlalockPrimary Care Eddi Hymes: SElisabeth CaraOther Clinician: Referring Bliss Tsang: Treating [name]/Extender: SHazle Cocain Treatment: 10 Encounter Discharge Information Items Discharge Condition: Stable Ambulatory Status: Ambulatory Discharge Destination: Home Transportation: Other Accompanied By: self Schedule Follow-up Appointment: Yes Clinical Summary of Care: CUSBALDO, ISHAQ([id number] Page 2 of 2 Notes GLadona MowT  service paid by the hospital. axi Electronic Signature(s) Signed: 03/17/2022 10:06:41 AM By: WGretta Cool BSN, RN, CWS, Kim RN, BSN Entered By: WGretta Cool BSN, RN, CWS, Kim on 03/17/2022 10:06:41 -------------------------------------------------------------------------------- Vitals Details Patient Name: Date of Service: [name] 03/17/2022 8:00 A M Medical Record Number: [id number] Account Number: [id number] of Birth/Sex: Treating RN: 7November 22, 1967(57y.o. MIsac Sarna KMaudie MercuryPrimary Care Mrk Buzby: SElisabeth CaraOther Clinician: Referring Yasmin Dibello: Treating Bryan Omura/Extender: SHazle Cocain Treatment: 10 Vital Signs Time Taken: 07:45 Temperature (F): 98.6 Height (in): 74 Pulse (bpm): 78 Weight (lbs): 371 Respiratory Rate (breaths/min): 16 Body Mass Index (BMI): 47.6 Blood Pressure (mmHg): 148/62 Capillary Blood Glucose (mg/dl): 185 Reference Range: 80 - 120 mg / dl Electronic Signature(s) Signed: 03/17/2022 8:22:55 AM By: WGretta Cool BSN, RN, CWS, Kim RN, BSN Entered By: WGretta Cool BSN, RN, CWS, Kim on 03/17/2022 08:22:55

## 2022-03-18 NOTE — Progress Notes (Signed)
JOUSHA, CRABB ([id number]) [id number] Page 1 of 10 Visit Report for 03/17/2022 Chief Complaint Document Details Patient Name: Date of Service: Raymond [name]. 03/17/2022 11:00 A M Medical Record Number: [id number] Patient Account Number: [id number] Date of Birth/Sex: Treating RN: 1965-11-21 (57 y.o. Raymond [name] Primary Care Provider: [name] Other Clinician: Massie Kluver Referring Provider: Treating Provider/Extender: Hazle Coca in Treatment: 10 Information Obtained from: Patient Chief Complaint Left foot ulcer with chronic osteomyelitis Electronic Signature(s) Signed: 03/17/2022 10:00:21 AM By: [name] PA-C Entered By: [name] on 03/17/2022 10:00:21 -------------------------------------------------------------------------------- Debridement Details Patient Name: Date of Service: Raymond Eke [name]. 03/17/2022 11:00 A M Medical Record Number: [id number] Patient Account Number: [id number] Date of Birth/Sex: Treating RN: 11-Mar-1965 (57 y.o. Raymond [name], Raymond [name] Primary Care Provider: [name] Other Clinician: Massie Kluver Referring Provider: Treating Provider/Extender: [name] Weeks in Treatment: 10 Debridement Performed for Assessment: Wound #2 Left,Medial Metatarsal head first Performed By: Physician Tommie Sams., PA-C Debridement Type: Debridement Severity of Tissue Pre Debridement: Fat layer exposed Level of Consciousness (Pre-procedure): Awake and Alert Pre-procedure Verification/Time Out Yes - 11:25 Taken: Start Time: 11:25 T Area Debrided (L x W): otal 0.5 (cm) x 0.5 (cm) = 0.25 (cm) Tissue and other material debrided: Viable, Non-Viable, Callus, Slough, Subcutaneous, Slough Level: Skin/Subcutaneous Tissue Debridement Description: Excisional Instrument: Curette Bleeding: Minimum Hemostasis Achieved: Pressure Response to Treatment: Procedure was  tolerated well Level of Consciousness (Post- Awake and Alert procedure): REMBERT, NAUGLE ([id number]) [id number] Page 2 of 10 Post Debridement Measurements of Total Wound Length: (cm) 0.3 Width: (cm) 0.3 Depth: (cm) 0.3 Volume: (cm) 0.021 Character of Wound/Ulcer Post Debridement: Improved Severity of Tissue Post Debridement: Fat layer exposed Post Procedure Diagnosis Same as Pre-procedure Electronic Signature(s) Signed: 03/17/2022 11:57:56 AM By: Massie Kluver Signed: 03/17/2022 5:39:55 PM By: [name] PA-C Signed: 03/18/2022 1:08:07 PM By: Gretta Cool, BSN, RN, CWS, Kim RN, BSN Entered By: Massie Kluver on 03/17/2022 11:30:48 -------------------------------------------------------------------------------- HPI Details Patient Name: Date of Service: Raymond [name], Raymond Negus [name]. 03/17/2022 11:00 A M Medical Record Number: [id number] Patient Account Number: [id number] Date of Birth/Sex: Treating RN: [birth year] (57 y.o. Raymond [name] Primary Care Provider: [name] Other Clinician: Massie Kluver Referring Provider: Treating Provider/Extender: [name] Weeks in Treatment: 10 History of Present Illness HPI Description: 09/05/17-He is seeing an initial evaluation for a left plantar foot ulcer. He has a remote history of left great toe amputation. He states that 4-6 weeks ago he noted callus formation and ulceration. He has not seen primary care regarding this. He is not currently on antibiotic therapy. He does not routinely follow with podiatry. He states diabetic foot wear will arrive early next week. The EHR shows an A1c of 9% approximate 4 months ago but he states he had one and primary care a few weeks ago but does not know the results. He is neuropathic and does not complain of any pain, he is currently wearing crocs. 09/12/17-he is here in follow up evaluation for left plantar foot ulcer. There is improvement in both  appearance and measurement. We will continue with same treatment plan and he will follow up next week. 09/19/17 on evaluation today patient actually appears to be doing excellent in regard to his ulcer on the invitation site of his left great foot plantar aspect. He's been tolerating the dressing changes without complication. In fact with the Prisma and the current measures he has been shown  signs of excellent improvement week by week up to this point. We have been to breeding the wound in this seems to have been of great benefit for him. Fortunately there is no evidence of infection. 09/26/17 on evaluation today patient appears to be doing rather well in regard to his wound. He did not know quite as much improvement this week as compared to last week. Nonetheless he still continues to show signs of improving to some degree. I do believe he may benefit from an offloading shoe. No fevers, chills, nausea, or vomiting noted at this time. 10/03/17 on evaluation today patient actually appears to be doing much better in regard to the amputation site plantar foot ulcer. Overall this appears significantly smaller even compared to previous. He's been tolerating the dressing changes without complication. He did get his diabetic shoes and I did have a look at them they appear to be fairly good. Nonetheless I do think that for the time being I would probably recommend he continue with the offloading shoe that we have been utilizing just due to the fact that with the dressing I don't know that his diabetic shoes are going to work as appropriately as far as not called an additional pressure and irritation to the area in question. He understands. 10/10/17 on evaluation today patient actually appears to be doing very well in regard to his plantar foot ulcer. He has been tolerating the dressing changes without complication. I do feel like he's making signs of good improvement and in fact of the wound bed appears to be  better although it may not be significantly changed in size it appears healthier and I do believe is showing signs of improving. Nonetheless we gonna keep working towards healing as far as that is concerned. There is no evidence of infection. 10/17/17 on evaluation today patient actually appears to be doing poorly in regard to his plantar foot ulcer. Unfortunately other than just the area where the wound is itself there appears to be a blister that communicates with the wound unfortunately. This is more lateral to the wound itself and also to the distal point of the amputation site. There does not appear to be any evidence of cellulitis spreading at the foot but I do believe some of the drainage from the site itself is. When in nature. Nonetheless this blister area I think needs to be removed in order to allow for appropriate healing of the ulcer itself I think that is gonna be difficult for it to heal otherwise. This was discussed with the patient today. 10/24/17 on evaluation today patient actually appears to be doing better compared to last week even post debridement. He has shown signs of improvement he did test positive for Staphylococcus aureus. With that being said he notes that he's not have any discomfort and in general he does feel like things seem to been doing better. Fortunately there's no additional blistering and no evidence of remaining infection at this time. No fevers, chills, nausea, or vomiting noted at this time. He has three days of antibiotic left. Raymond [name], Raymond [name] ([id number]) [id number] Page 3 of 10 10/31/17 on evaluation today patient actually appears to be doing very well in regard to his ulcer on the left foot. He has been tolerating the dressing changes without complication. With that being said fortunately there appears to be no evidence of infection I do think he's made progress compared to previous. No fevers, chills, nausea, or vomiting  noted at this time. 11/14/17 on  evaluation today patient actually appears to be doing excellent in regard to his amputation site ulcer on his foot. In fact this appears to be completely healed at this point. There is no evidence of infection nor underline abscess and I did thoroughly evaluate the periwound location. Overall I'm very pleased with how things appear. Readmission: 01-03-2022 upon evaluation today patient appears for reevaluation here in the clinic although it has been since 2019 that I last saw him. This again has been over 4 years. With that being said he is having an issue with a wound on his foot unfortunately. He has not had any recent x-rays he tells me at this point which is unfortunate as avoid definitely can need to get something started that can be one of the primary items to get moving forward with. With that being said I also think were probably can obtain a wound culture he is probably going to require some antibiotics at some point. I just want to make sure that we have him on the right thing when we do this. Currently the patient tells me that his wounds have been present in regard to the met head for about a month at this point. With that being said he also has a wound on the third toe which is the next toe this actually still present. This unfortunately has a lot of callus buildup around it but I think it is larger underneath than what it would appear on initial inspection. Patient does have a history of diabetes mellitus type 2, congestive heart failure, and COPD. 01-11-2022 upon evaluation patient's wounds actually are showing signs of doing about the same may be just slightly cleaner but definitely not where we want things to be as of yet. Fortunately there does not appear to be any signs of active infection locally nor systemically at this point which is great news. No fevers, chills, nausea, vomiting, or diarrhea. Of note patient has had his MRI but we do not have the  results of that as of yet we are still waiting for the reading on this at this point. 01-18-2022 upon evaluation today patient presents after having had his MRI finally complete. 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 The third and fourth digits seem to be early which is good news. Nonetheless we are still going to need to try to see about getting the infection under control he is already been on the antibiotic therapy which I think has done quite well. In fact I feel like he is showing signs of improvement already. 12/28; patient with a wound on the medial aspect of the left first metatarsal head and an area on the left third toe with slight hammer deformity. He has had a previous left first toe amputation. We are using Iodoflex on the wound. He is a diabetic a recent MRI showed osteomyelitis we have been giving him Cipro and Doxy which he appears to be tolerating well. 02-10-2022 upon evaluation today patient unfortunately has issues still with open wounds of his foot on the left. He does have known osteomyelitis at this point and that is something that we have been treating with oral antibiotics. I actually have not seen him personally since 19 December. In that time he has not had any debridement from that point until today based on what I can see as best I can tell anyway. Has been on Cipro as well as doxycycline. Nonetheless  he does still seem to have open wounds today and I think that he would benefit from hyperbaric oxygen therapy. I discussed that with him today as well. 02-18-2022 upon evaluation today patient appears to be doing about the same in regard to his wounds. Fortunately I do not see any signs of infection although he still has areas that seem to initially close down but that he has a lot of callus that still open at both locations most on the metatarsal region as well as the toe. I think that he is appropriate  for proceeding with the hyperbarics and I think the sooner we get this done to try to get these wounds healed the better off he will be. 02-25-2022 upon evaluation today patient's wounds do show signs of being dry get again. We were attempting to pack and some of the alginate dressing instead of doing the collagen as everything was getting very dry but nonetheless this is still continue to be an ongoing issue. My suggestion based on what I am seeing is good to be that we actually switch to doing Xeroform which should hopefully keep this a little bit more moist and from hopefully closing up prematurely. The patient voiced understanding. 03-03-2022 upon evaluation today patient appears to be doing well with regard to his wounds things as before appear to be getting dry. We are using Xeroform to try to keep it open is much as possible but each time I see him he does tend to cover over the good news is each time he also seems to be getting a little bit smaller which is excellent. Fortunately I do not see any evidence of active infection locally nor systemically which is great news. I think this means that between the antibiotics and the hyperbarics were really on a good track here. 03-10-2022 upon evaluation today patient appears to be doing well currently in regard to his wounds. He is actually showing signs of some good improvement he is also tolerating HBO therapy very well. I do not see any evidence of active infection locally nor systemically which is great news and overall I am extremely pleased in that regard. No fevers, chills, nausea, vomiting, or diarrhea. 03-17-2022 upon evaluation today patient appears to be doing well currently in regard to his wounds in fact the toe is completely healed. The metatarsal head location though not completely healed appears to be doing much better which is great news and overall I am extremely pleased with where we stand today. Electronic Signature(s) Signed:  03/17/2022 4:59:24 PM By: [name] PA-C Entered By: [name] on 03/17/2022 16:59:24 -------------------------------------------------------------------------------- Physical Exam Details Patient Name: Date of Service: Raymond Eke [name]. 03/17/2022 11:00 A M Medical Record Number: [id number] Patient Account Number: [id number] Date of Birth/Sex: Treating RN: 1965/05/22 (57 y.o. Raymond [name], Raymond [name], Raymond [name] ([id number]) [phone].pdf Page 4 of 10 Primary Care Provider: [name] Other Clinician: Massie Kluver Referring Provider: Treating Provider/Extender: Hazle Coca in Treatment: 28 Constitutional Well-nourished and well-hydrated in no acute distress. Respiratory normal breathing without difficulty. Psychiatric this patient is able to make decisions and demonstrates good insight into disease process. Alert and Oriented x 3. pleasant and cooperative. Notes Upon inspection patient's wound bed actually showed signs of good granulation epithelization at this point. Fortunately there does not appear to be any signs of infection locally or systemically which is great news and overall I do believe that we are on the right track. Electronic Signature(s)  Signed: 03/17/2022 4:59:56 PM By: [name] PA-C Entered By: [name] on 03/17/2022 16:59:56 -------------------------------------------------------------------------------- Physician Orders Details Patient Name: Date of Service: Raymond Eke [name]. 03/17/2022 11:00 A M Medical Record Number: [id number] Patient Account Number: [id number] Date of Birth/Sex: Treating RN: 06-07-1965 (57 y.o. Raymond [name], Raymond [name] Primary Care Provider: [name] Other Clinician: Massie Kluver Referring Provider: Treating Provider/Extender: Hazle Coca in Treatment: 10 Verbal / Phone Orders: No Diagnosis Coding ICD-10 Coding Code  Description [phone] Chronic multifocal osteomyelitis, left ankle and foot E11.621 Type 2 diabetes mellitus with foot ulcer L97.522 Non-pressure chronic ulcer of other part of left foot with fat layer exposed I50.42 Chronic combined systolic (congestive) and diastolic (congestive) heart failure J44.9 Chronic obstructive pulmonary disease, unspecified Bathing/ Shower/ Hygiene Wash wounds with antibacterial soap and water. May shower; gently cleanse wound with antibacterial soap, rinse and pat dry prior to dressing wounds Off-Loading Open toe surgical shoe Hyperbaric Oxygen Therapy Wound #2 Left,Medial Metatarsal head first Indication and location: - [place] Grade 3 ulcers of the left foot and 3rd toe If appropriate for treatment, begin HBOT per protocol: 2.0 ATA for 90 Minutes without A Breaks ir One treatment per day (delivered Monday through Friday unless otherwise specified in Special Instructions below): Total # of Treatments: - 40 A ntihistamine 30 minutes prior to HBO Treatment, difficulty clearing ears. Finger stick Blood Glucose Pre- and Post- HBOT Treatment. Follow Hyperbaric Oxygen Glycemia Protocol Wound Treatment Raymond [name], Raymond [name] ([id number]) [id number] Page 5 of 10 Wound #2 - Metatarsal head first Wound Laterality: Left, Medial Cleanser: [place]ram Ancillary Kit - 15 Day Supply (Generic) 3 x Per Week/30 Days Discharge Instructions: Use supplies as instructed; Kit contains: (15) Saline Bullets; (15) 3x3 Gauze; 15 pr Gloves Cleanser: Soap and Water 3 x Per Week/30 Days Discharge Instructions: Gently cleanse wound with antibacterial soap, rinse and pat dry prior to dressing wounds Prim Dressing: Endoform Natural, Non-fenestrated, 2x2 (in/in) ary 3 x Per Week/30 Days Secondary Dressing: ABD Pad 5x9 (in/in) 3 x Per Week/30 Days Discharge Instructions: Cover with ABD pad Secondary Dressing: Kerlix 4.5 x 4.1 (in/yd) 3 x Per Week/30 Days Discharge  Instructions: Apply Kerlix 4.5 x 4.1 (in/yd) as instructed Secondary Dressing: mepitel 3 x Per Week/30 Days Secured With: Medipore T - 74M Medipore H Soft Cloth Surgical T ape ape, 2x2 (in/yd) (Generic) 3 x Per Week/30 Days Secured With: Steri-Strip 0.25x4 (in/in) 3 x Per Week/30 Days Discharge Instructions: Apply Steri-Strip as directed [place] Obtain pre-HBO capillary blood glucose (ensure 1 physician order is in chart). A. Notify HBO physician and await physician orders. 2 If result is 70 mg/dl or below: B. If the result meets the hospital definition of a critical result, follow hospital policy. A. Give patient an 8 ounce Glucerna Shake, an 8 ounce Ensure, or 8 ounces of a Glucerna/Ensure equivalent dietary supplement*. B. Wait 30 minutes. If result is 71 mg/dl to 130 mg/dl: C. Retest patients capillary blood glucose (CBG). [name]. If result greater than or equal to 110 mg/dl, proceed with HBO. If result less than 110 mg/dl, notify HBO physician and consider holding HBO. If result is 131 mg/dl to 249 mg/dl: A. Proceed with HBO. A. Notify HBO physician and await physician orders. B. It is recommended to hold HBO and do If result is 250 mg/dl or greater: blood/urine ketone testing. C. If the result meets the hospital definition of a critical result, follow hospital policy. POST-HBO GLYCEMIA  INTERVENTIONS ACTION INTERVENTION Obtain post HBO capillary blood glucose (ensure 1 physician order is in chart). A. Notify HBO physician and await physician orders. 2 If result is 70 mg/dl or below: B. If the result meets the hospital definition of a critical result, follow hospital policy. A. Give patient an 8 ounce Glucerna Shake, an 8 ounce Ensure, or 8 ounces of a Glucerna/Ensure equivalent dietary supplement*. B. Wait 15 minutes for symptoms of If result is 71 mg/dl to 100 mg/dl: hypoglycemia (i.e.  nervousness, anxiety, sweating, chills, clamminess, irritability, confusion, tachycardia or dizziness). C. If patient asymptomatic, discharge patient. If patient symptomatic, repeat capillary blood glucose (CBG) and notify HBO physician. If result is 101 mg/dl to 249 mg/dl: A. Discharge patient. A. Notify HBO physician and await physician orders. B. It is recommended to do blood/urine ketone If result is 250 mg/dl or greater: testing. C. If the result meets the hospital definition of a critical result, follow hospital policy. *Juice or candies are NOT equivalent products. If patient refuses the Glucerna or Ensure, please consult the hospital dietitian for an appropriate substitute. Electronic Signature(s) Signed: 03/17/2022 11:57:56 AM By: Massie Kluver Signed: 03/17/2022 5:39:55 PM By: Caro Hight ([id number]) [id number] Page 6 of 10 Entered By: Massie Kluver on 03/17/2022 11:31:35 -------------------------------------------------------------------------------- Problem List Details Patient Name: Date of Service: Raymond [name]. 03/17/2022 11:00 A M Medical Record Number: [id number] Patient Account Number: [id number] Date of Birth/Sex: Treating RN: [birth year] (57 y.o. Raymond [name], Raymond [name] Primary Care Provider: [name] Other Clinician: Massie Kluver Referring Provider: Treating Provider/Extender: [name] Weeks in Treatment: 10 Active Problems ICD-10 Encounter Code Description Active Date MDM Diagnosis [phone] Chronic multifocal osteomyelitis, left ankle and foot 02/10/2022 No Yes E11.621 Type 2 diabetes mellitus with foot ulcer 01/03/2022 No Yes L97.522 Non-pressure chronic ulcer of other part of left foot with fat layer exposed 01/03/2022 No Yes I50.42 Chronic combined systolic (congestive) and diastolic (congestive) heart failure 01/03/2022 No Yes J44.9 Chronic obstructive pulmonary  disease, unspecified 01/03/2022 No Yes Inactive Problems Resolved Problems Electronic Signature(s) Signed: 03/17/2022 10:00:10 AM By: [name] PA-C Entered By: [name] on 03/17/2022 10:00:09 Progress Note Details -------------------------------------------------------------------------------- Raymond [name] ([id number]) [id number] Page 7 of 10 Patient Name: Date of Service: Raymond Eke [name]. 03/17/2022 11:00 A M Medical Record Number: [id number] Patient Account Number: [id number] Date of Birth/Sex: Treating RN: Jun 23, 1965 (57 y.o. Raymond [name] Primary Care Provider: [name] Other Clinician: Massie Kluver Referring Provider: Treating Provider/Extender: Hazle Coca in Treatment: 10 Subjective Chief Complaint Information obtained from Patient Left foot ulcer with chronic osteomyelitis History of Present Illness (HPI) 09/05/17-He is seeing an initial evaluation for a left plantar foot ulcer. He has a remote history of left great toe amputation. He states that 4-6 weeks ago he noted callus formation and ulceration. He has not seen primary care regarding this. He is not currently on antibiotic therapy. He does not routinely follow with podiatry. He states diabetic foot wear will arrive early next week. The EHR shows an A1c of 9% approximate 4 months ago but he states he had one and primary care a few weeks ago but does not know the results. He is neuropathic and does not complain of any pain, he is currently wearing crocs. 09/12/17-he is here in follow up evaluation for left plantar foot ulcer. There is improvement in both appearance and measurement. We will continue with same treatment plan and he will  follow up next week. 09/19/17 on evaluation today patient actually appears to be doing excellent in regard to his ulcer on the invitation site of his left great foot plantar aspect. He's been tolerating the  dressing changes without complication. In fact with the Prisma and the current measures he has been shown signs of excellent improvement week by week up to this point. We have been to breeding the wound in this seems to have been of great benefit for him. Fortunately there is no evidence of infection. 09/26/17 on evaluation today patient appears to be doing rather well in regard to his wound. He did not know quite as much improvement this week as compared to last week. Nonetheless he still continues to show signs of improving to some degree. I do believe he may benefit from an offloading shoe. No fevers, chills, nausea, or vomiting noted at this time. 10/03/17 on evaluation today patient actually appears to be doing much better in regard to the amputation site plantar foot ulcer. Overall this appears significantly smaller even compared to previous. He's been tolerating the dressing changes without complication. He did get his diabetic shoes and I did have a look at them they appear to be fairly good. Nonetheless I do think that for the time being I would probably recommend he continue with the offloading shoe that we have been utilizing just due to the fact that with the dressing I don't know that his diabetic shoes are going to work as appropriately as far as not called an additional pressure and irritation to the area in question. He understands. 10/10/17 on evaluation today patient actually appears to be doing very well in regard to his plantar foot ulcer. He has been tolerating the dressing changes without complication. I do feel like he's making signs of good improvement and in fact of the wound bed appears to be better although it may not be significantly changed in size it appears healthier and I do believe is showing signs of improving. Nonetheless we gonna keep working towards healing as far as that is concerned. There is no evidence of infection. 10/17/17 on evaluation today patient actually  appears to be doing poorly in regard to his plantar foot ulcer. Unfortunately other than just the area where the wound is itself there appears to be a blister that communicates with the wound unfortunately. This is more lateral to the wound itself and also to the distal point of the amputation site. There does not appear to be any evidence of cellulitis spreading at the foot but I do believe some of the drainage from the site itself is. When in nature. Nonetheless this blister area I think needs to be removed in order to allow for appropriate healing of the ulcer itself I think that is gonna be difficult for it to heal otherwise. This was discussed with the patient today. 10/24/17 on evaluation today patient actually appears to be doing better compared to last week even post debridement. He has shown signs of improvement he did test positive for Staphylococcus aureus. With that being said he notes that he's not have any discomfort and in general he does feel like things seem to been doing better. Fortunately there's no additional blistering and no evidence of remaining infection at this time. No fevers, chills, nausea, or vomiting noted at this time. He has three days of antibiotic left. 10/31/17 on evaluation today patient actually appears to be doing very well in regard to his ulcer on the left foot.  He has been tolerating the dressing changes without complication. With that being said fortunately there appears to be no evidence of infection I do think he's made progress compared to previous. No fevers, chills, nausea, or vomiting noted at this time. 11/14/17 on evaluation today patient actually appears to be doing excellent in regard to his amputation site ulcer on his foot. In fact this appears to be completely healed at this point. There is no evidence of infection nor underline abscess and I did thoroughly evaluate the periwound location. Overall I'm very pleased with how things  appear. Readmission: 01-03-2022 upon evaluation today patient appears for reevaluation here in the clinic although it has been since 2019 that I last saw him. This again has been over 4 years. With that being said he is having an issue with a wound on his foot unfortunately. He has not had any recent x-rays he tells me at this point which is unfortunate as avoid definitely can need to get something started that can be one of the primary items to get moving forward with. With that being said I also think were probably can obtain a wound culture he is probably going to require some antibiotics at some point. I just want to make sure that we have him on the right thing when we do this. Currently the patient tells me that his wounds have been present in regard to the met head for about a month at this point. With that being said he also has a wound on the third toe which is the next toe this actually still present. This unfortunately has a lot of callus buildup around it but I think it is larger underneath than what it would appear on initial inspection. Patient does have a history of diabetes mellitus type 2, congestive heart failure, and COPD. 01-11-2022 upon evaluation patient's wounds actually are showing signs of doing about the same may be just slightly cleaner but definitely not where we want things to be as of yet. Fortunately there does not appear to be any signs of active infection locally nor systemically at this point which is great news. No fevers, chills, nausea, vomiting, or diarrhea. Of note patient has had his MRI but we do not have the results of that as of yet we are still waiting for the reading on this at this point. 01-18-2022 upon evaluation today patient presents after having had his MRI finally complete. 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 The third and fourth digits seem to be early which is good news.  Nonetheless we are still going to need to try to see about getting the infection under control he is already been on the antibiotic therapy which I think has done quite well. In fact I feel like he is showing signs of improvement already. 12/28; patient with a wound on the medial aspect of the left first metatarsal head and an area on the left third toe with slight hammer deformity. He has had a previous left first toe amputation. We are using Iodoflex on the wound. He is a diabetic a recent MRI showed osteomyelitis we have been giving him Cipro and Doxy which he appears to be tolerating well. 02-10-2022 upon evaluation today patient unfortunately has issues still with open wounds of his foot on the left. He does have known osteomyelitis at this point and that is something that we have been treating with oral antibiotics. I  actually have not seen him personally since 19 December. In that time he has not had Raymond [name], Raymond [name] ([id number]) [id number] Page 8 of 10 any debridement from that point until today based on what I can see as best I can tell anyway. Has been on Cipro as well as doxycycline. Nonetheless he does still seem to have open wounds today and I think that he would benefit from hyperbaric oxygen therapy. I discussed that with him today as well. 02-18-2022 upon evaluation today patient appears to be doing about the same in regard to his wounds. Fortunately I do not see any signs of infection although he still has areas that seem to initially close down but that he has a lot of callus that still open at both locations most on the metatarsal region as well as the toe. I think that he is appropriate for proceeding with the hyperbarics and I think the sooner we get this done to try to get these wounds healed the better off he will be. 02-25-2022 upon evaluation today patient's wounds do show signs of being dry get again. We were attempting to pack and some of the  alginate dressing instead of doing the collagen as everything was getting very dry but nonetheless this is still continue to be an ongoing issue. My suggestion based on what I am seeing is good to be that we actually switch to doing Xeroform which should hopefully keep this a little bit more moist and from hopefully closing up prematurely. The patient voiced understanding. 03-03-2022 upon evaluation today patient appears to be doing well with regard to his wounds things as before appear to be getting dry. We are using Xeroform to try to keep it open is much as possible but each time I see him he does tend to cover over the good news is each time he also seems to be getting a little bit smaller which is excellent. Fortunately I do not see any evidence of active infection locally nor systemically which is great news. I think this means that between the antibiotics and the hyperbarics were really on a good track here. 03-10-2022 upon evaluation today patient appears to be doing well currently in regard to his wounds. He is actually showing signs of some good improvement he is also tolerating HBO therapy very well. I do not see any evidence of active infection locally nor systemically which is great news and overall I am extremely pleased in that regard. No fevers, chills, nausea, vomiting, or diarrhea. 03-17-2022 upon evaluation today patient appears to be doing well currently in regard to his wounds in fact the toe is completely healed. The metatarsal head location though not completely healed appears to be doing much better which is great news and overall I am extremely pleased with where we stand today. Objective Constitutional Well-nourished and well-hydrated in no acute distress. Vitals Time Taken: 10:56 AM, Height: 74 in, Weight: 371 lbs, BMI: 47.6, Temperature: 98.6 F, Pulse: 78 bpm, Respiratory Rate: 16 breaths/min, Blood Pressure: 148/62 mmHg. Respiratory normal breathing without  difficulty. Psychiatric this patient is able to make decisions and demonstrates good insight into disease process. Alert and Oriented x 3. pleasant and cooperative. General Notes: Upon inspection patient's wound bed actually showed signs of good granulation epithelization at this point. Fortunately there does not appear to be any signs of infection locally or systemically which is great news and overall I do believe that we are on the right track. Integumentary (Hair, Skin) Wound #2 status  is Open. Original cause of wound was Gradually Appeared. The date acquired was: 12/13/2021. The wound has been in treatment 10 weeks. The wound is located on the Left,Medial Metatarsal head first. The wound measures 0.1cm length x 0.1cm width x 0.1cm depth; 0.008cm^2 area and 0.001cm^3 volume. There is Fat Layer (Subcutaneous Tissue) exposed. There is a large amount of serous drainage noted. The wound margin is flat and intact. There is large (67-100%) granulation within the wound bed. There is a small (1-33%) amount of necrotic tissue within the wound bed including Adherent Slough. Wound #3 status is Healed - Epithelialized. Original cause of wound was Footwear Injury. The date acquired was: 01/03/2022. The wound has been in treatment 10 weeks. The wound is located on the Left T Third. The wound measures 0cm length x 0cm width x 0cm depth; 0cm^2 area and 0cm^3 volume. There is Fat oe Layer (Subcutaneous Tissue) exposed. There is a none present amount of drainage noted. The wound margin is flat and intact. There is no granulation within the wound bed. There is no necrotic tissue within the wound bed. 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 There was a Excisional Skin/Subcutaneous Tissue Debridement with a total area of 0.25 sq cm performed by Tommie Sams., PA-C. With the following instrument(s): Curette to remove Viable and Non-Viable tissue/material. Material removed includes Callus, Subcutaneous Raymond [name], Raymond [name] ([id number]) [id number] Page 9 of 10 Tissue, and Slough. A time out was conducted at 11:25, prior to the start of the procedure. A Minimum amount of bleeding was controlled with Pressure. The procedure was tolerated well. Post Debridement Measurements: 0.3cm length x 0.3cm width x 0.3cm depth; 0.021cm^3 volume. Character of Wound/Ulcer Post Debridement is improved. Severity of Tissue Post Debridement is: Fat layer exposed. Post procedure Diagnosis Wound #2: Same as Pre-Procedure Plan Bathing/ Shower/ Hygiene: Wash wounds with antibacterial soap and water. May shower; gently cleanse wound with antibacterial soap, rinse and pat dry prior to dressing wounds Off-Loading: Open toe surgical shoe Hyperbaric Oxygen Therapy: Wound #2 Left,Medial Metatarsal head first: Indication and location: - [place] Grade 3 ulcers of the left foot and 3rd toe If appropriate for treatment, begin HBOT per protocol: 2.0 ATA for 90 Minutes without Air Breaks One treatment per day (delivered Monday through Friday unless otherwise specified in Special Instructions below): T # of Treatments: - 40 otal Antihistamine 30 minutes prior to HBO Treatment, difficulty clearing ears. Finger stick Blood Glucose Pre- and Post- HBOT Treatment. Follow Hyperbaric Oxygen Glycemia Protocol WOUND #2: - Metatarsal head first Wound Laterality: Left, Medial Cleanser: [place]ram Ancillary Kit - 15 Day  Supply (Generic) 3 x Per Week/30 Days Discharge Instructions: Use supplies as instructed; Kit contains: (15) Saline Bullets; (15) 3x3 Gauze; 15 pr Gloves Cleanser: Soap and Water 3 x Per Week/30 Days Discharge Instructions: Gently cleanse wound with antibacterial soap, rinse and pat dry prior to dressing wounds Prim Dressing: Endoform Natural, Non-fenestrated, 2x2 (in/in) 3 x Per Week/30 Days ary Secondary Dressing: ABD Pad 5x9 (in/in) 3 x Per Week/30 Days Discharge Instructions: Cover with ABD pad Secondary Dressing: Kerlix 4.5  x 4.1 (in/yd) 3 x Per Week/30 Days Discharge Instructions: Apply Kerlix 4.5 x 4.1 (in/yd) as instructed Secondary Dressing: mepitel 3 x Per Week/30 Days Secured With: Medipore T - 28M Medipore H Soft Cloth Surgical T ape ape, 2x2 (in/yd) (Generic) 3 x Per Week/30 Days Secured With: Steri-Strip 0.25x4 (in/in) 3 x Per Week/30 Days Discharge Instructions: Apply Steri-Strip as directed 1. I am going to suggest that we have the patient continue to utilize the endoform for the metatarsal head location I think this really has not a great job up to this point and I think we should continue as such. 2. I am also can recommend that we continue to cover this with the Adaptic as we have been doing previous and then the roll gauze to secure in place. 3. We will also continue with HBO therapy which seems to be doing excellent for the patient. We will see patient back for reevaluation in 1 week here in the clinic. If anything worsens or changes patient will contact our office for additional recommendations. Electronic Signature(s) Signed: 03/17/2022 5:00:21 PM By: [name] PA-C Entered By: [name] on 03/17/2022 17:00:20 -------------------------------------------------------------------------------- SuperBill Details Patient Name: Date of Service: Raymond [name], Raymond Negus [name]. 03/17/2022 Medical Record Number: [id number] Patient Account Number: [id number] Date of  Birth/Sex: Treating RN: [birth year] (57 y.o. Raymond [name] Primary Care Provider: [name] Other Clinician: Massie Kluver Referring Provider: Treating Provider/Extender: Hazle Coca in Treatment: [address] TILAK, BRIETZKE ([id number]) [id number] Page 10 of 10 ICD-10 Codes Code Description [phone] Chronic multifocal osteomyelitis, left ankle and foot E11.621 Type 2 diabetes mellitus with foot ulcer L97.522 Non-pressure chronic ulcer of other part of left foot with fat layer exposed I50.42 Chronic combined systolic (congestive) and diastolic (congestive) heart failure J44.9 Chronic obstructive pulmonary disease, unspecified Facility Procedures : CPT4 Code: [id number] Description: [place] - DEB SUBQ TISSUE 20 SQ CM/< ICD-10 Diagnosis Description L97.522 Non-pressure chronic ulcer of other part of left foot with fat layer exposed Modifier: Quantity: 1 Physician Procedures : CPT4 Code Description Modifier [id number] - WC PHYS SUBQ TISS 20 SQ CM ICD-10 Diagnosis Description L97.522 Non-pressure chronic ulcer of other part of left foot with fat layer exposed Quantity: 1 Electronic Signature(s) Signed: 03/17/2022 5:00:37 PM By: [name] PA-C Entered By: [name] on 03/17/2022 17:00:36

## 2022-03-18 NOTE — Progress Notes (Signed)
CAMERIN, MCLANEY ([id number]) [id number] Page 1 of 2 Visit Report for 03/17/2022 HBO Details Patient Name: Date of Service: Raymond [name]. 03/17/2022 8:00 A M Medical Record Number: [id number] Patient Account Number: [id number] Date of Birth/Sex: Treating RN: [birth year] (57 y.o. 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 HBO Treatment Details Treatment Number: 16 Patient Type: Outpatient Chamber Type: Monoplace Chamber Serial #: [id number] Treatment Protocol: 2.0 ATA with 90 minutes oxygen, and no air breaks Treatment Details Compression Rate Down: 1.5 psi / minute De-Compression Rate Up: 1.5 psi / minute Air breaks and breathing Decompress Decompress Compress Tx Pressure Begins Reached periods Begins Ends (leave unused spaces blank) Chamber Pressure (ATA 1 2 ------2 1 ) Clock Time (24 hr) 08:01 08:13 - - - - - - 09:44 09:55 Treatment Length: 114 (minutes) Treatment Segments: 4 Vital Signs Capillary Blood Glucose Reference Range: 80 - 120 mg / dl HBO Diabetic Blood Glucose Intervention Range: <131 mg/dl or >249 mg/dl Type: Time Vitals Blood Respiratory Capillary Blood Glucose Pulse Action Pulse: Temperature: Taken: Pressure: Rate: Glucose (mg/dl): Meter #: Oximetry (%) Taken: Pre 07:45 148/62 78 16 98.6 185 treatment initiated Post 10:00 148/62 80 16 97.9 189 discharged home Treatment Response Treatment Toleration: Well Treatment Completion Status: Treatment Completed without Adverse Event HBO Attestation I certify that I supervised this HBO treatment in accordance with  Medicare guidelines. A trained emergency response team is readily available per Yes hospital policies and procedures. Continue HBOT as ordered. Yes Electronic Signature(s) Signed: 03/17/2022 10:05:14 AM By: Gretta Cool, BSN, RN, CWS, Kim RN, BSN Signed: 03/17/2022 5:39:55 PM By: [name] PA-C Previous Signature: 03/17/2022 10:03:44 AM Version By: Gretta Cool BSN, RN, CWS, Kim RN, BSN Previous Signature: 03/17/2022 9:45:04 AM Version By: Gretta Cool BSN, RN, CWS, Kim RN, BSN [place], [place] [name] ([id number]) [id number] Page 2 of 2 Previous Signature: 03/17/2022 9:45:04 AM Version By: Gretta Cool, BSN, RN, CWS, Kim RN, BSN Previous Signature: 03/17/2022 8:24:41 AM Version By: Gretta Cool, BSN, RN, CWS, Kim RN, BSN Entered By: Gretta Cool, BSN, RN, CWS, Kim on 03/17/2022 10:05:14 -------------------------------------------------------------------------------- HBO Safety Checklist Details Patient Name: Date of Service: Raymond [name], Raymond [name]. 03/17/2022 8:00 A M Medical Record Number: [id number] Patient Account Number: [id number] Date of Birth/Sex: Treating RN: 1965/03/18 (57 y.o. [name], [name] Primary Care Susan Bleich: [name] Other Clinician: Referring Minoru Chap: Treating Jael Waldorf/Extender: [name] Weeks in Treatment: 10 HBO Safety Checklist Items Safety Checklist Consent Form Signed Patient voided / foley secured and emptied When did you last eato am Last dose of injectable or oral agent pm Ostomy pouch emptied and vented if applicable NA All implantable devices assessed, documented and approved NA Intravenous access site secured and place NA Valuables secured Linens and cotton and cotton/polyester blend (less than 51% polyester) Personal oil-based products / skin lotions / body lotions removed Wigs or hairpieces removed NA Smoking or tobacco materials removed NA Books / newspapers / magazines / loose paper removed NA Cologne, aftershave, perfume and  deodorant removed Jewelry removed (may wrap wedding band) NA Make-up removed NA Hair care products removed Battery operated devices (external) removed NA Heating patches and chemical warmers removed NA Titanium eyewear removed NA Nail polish cured greater than 10 hours NA Casting  material cured greater than 10 hours NA Hearing aids removed NA Loose dentures or partials removed NA Prosthetics have been removed NA Patient demonstrates correct use of air break device (if applicable) Patient concerns have been addressed Patient grounding bracelet on and cord attached to chamber Specifics for Inpatients (complete in addition to above) Medication sheet sent with patient Intravenous medications needed or due during therapy sent with patient Drainage tubes (e.g. nasogastric tube or chest tube secured and vented) Endotracheal or Tracheotomy tube secured Cuff deflated of air and inflated with saline Airway suctioned Electronic Signature(s) Signed: 03/17/2022 8:24:08 AM By: Gretta Cool, BSN, RN, CWS, Kim RN, BSN Entered By: Gretta Cool, BSN, RN, CWS, Kim on 03/17/2022 08:24:07

## 2022-03-18 NOTE — Progress Notes (Signed)
JIBREEL, LORIG ([id number]) [id number] Page 1 of 11 Visit Report for 03/17/2022 Arrival Information Details Patient Name: Date of Service: Raymond [name]. 03/17/2022 11:00 A M Medical Record Number: [id number] Patient Account Number: [id number] Date of Birth/Sex: Treating RN: [birth year] (57 y.o. Raymond [name] Primary Care Estevan Kersh: [name] Other Clinician: Massie Kluver Referring Xee Hollman: Treating Raegan Sipp/Extender: Hazle Coca in Treatment: 10 Visit Information History Since Last Visit All ordered tests and consults were completed: No Patient Arrived: Ambulatory Added or deleted any medications: No Arrival Time: 10:53 Any new allergies or adverse reactions: No Transfer Assistance: None Had a fall or experienced change in No Patient Identification Verified: Yes activities of daily living that may affect Secondary Verification Process Completed: Yes risk of falls: Patient Requires Transmission-Based Precautions: No Signs or symptoms of abuse/neglect since No Patient Has Alerts: Yes last visito Patient Alerts: Type II Diabetic Hospitalized since last visit: No Aspirin 51m Implantable device outside of the clinic No ABI L [place]>220 excluding cellular tissue based products placed in the center since last visit: Has Dressing in Place as Prescribed: Yes Has Footwear/Offloading in Place as Yes Prescribed: Left: Surgical Shoe with Pressure Relief Insole Pain Present Now: No Electronic Signature(s) Signed: 03/17/2022 11:57:56 AM By: VMassie KluverEntered By: VMassie Kluveron 03/17/2022 10:56:31 -------------------------------------------------------------------------------- Clinic Level of Care Assessment Details Patient Name: Date of Service: CGar Ponto2/15/2024 11:00 A M Medical Record Number: [id number] Account Number: [id number] of Birth/Sex: Treating RN: 704-29-1967(57 y.o. MVerl BlalockPrimary Care Karyn Brull: SElisabeth CaraOther Clinician: VMassie KluverReferring Ramiro Pangilinan: Treating Destynie Toomey/Extender: SHazle Cocain [facility]: [facility] Assessment Items TOOL 1 Quantity Score CLESEAN, KENIMER([id number] Page 2 of 11 []$  - 0 Use when EandM and Procedure is performed on INITIAL visit ASSESSMENTS - Nursing Assessment / Reassessment []$  - 0 General Physical Exam (combine w/ comprehensive assessment (listed just below) when performed on new pt. evals) []$  - 0 Comprehensive Assessment (HX, ROS, Risk Assessments, Wounds Hx, etc.) ASSESSMENTS - Wound and Skin Assessment / Reassessment []$  - 0 Dermatologic / Skin Assessment (not related to wound area) ASSESSMENTS - Ostomy and/or Continence Assessment and Care []$  - 0 Incontinence Assessment and Management []$  - 0 Ostomy Care Assessment and Management (repouching, etc.) PROCESS - Coordination of Care []$  - 0 Simple Patient / Family Education for ongoing care []$  - 0 Complex (extensive) Patient / Family Education for ongoing care []$  - 0 Staff obtains CProgrammer, systems Records, T Results / Process Orders est []$  - 0 Staff telephones HHA, Nursing Homes / Clarify orders / etc []$  - 0 Routine Transfer to another Facility (non-emergent condition) []$  - 0 Routine Hospital Admission (non-emergent condition) []$  - 0 New Admissions / IBiomedical engineer/ Ordering NPWT Apligraf, etc. , []$  - 0 Emergency Hospital Admission (emergent condition) PROCESS - Special Needs []$  - 0 Pediatric / Minor Patient Management []$  - 0 Isolation Patient Management []$  - 0 Hearing / Language / Visual special needs []$  - 0 Assessment of Community assistance (transportation, D/C planning, etc.) []$  - 0 Additional assistance / Altered mentation []$  - 0 Support Surface(s) Assessment (bed, cushion, seat, etc.) INTERVENTIONS - Miscellaneous []$  - 0 External  ear exam []$  - 0 Patient Transfer (multiple staff / HCivil Service fast streamer/ Similar devices) []$  - 0 Simple Staple / Suture removal (25 or less) []$  - 0 Complex Staple / Suture removal (26 or more) []$  -  0 Hypo/Hyperglycemic Management (do not check if billed separately) []$  - 0 Ankle / Brachial Index (ABI) - do not check if billed separately Has the patient been seen at the hospital within the last three years: Yes Total Score: 0 Level Of Care: ____ Electronic Signature(s) Signed: 03/17/2022 11:57:56 AM By: Massie Kluver Entered By: Massie Kluver on 03/17/2022 11:31:42 Encounter Discharge Information Details -------------------------------------------------------------------------------- Raymond [name] ([id number]) [id number] Page 3 of 11 Patient Name: Date of Service: Raymond [name]. 03/17/2022 11:00 A M Medical Record Number: [id number] Patient Account Number: [id number] Date of Birth/Sex: Treating RN: Jun 15, 1965 (56 y.o. Raymond [name] Primary Care Imoni Kohen: [name] Other Clinician: Massie Kluver Referring Lafe Clerk: Treating Jaeley Wiker/Extender: Hazle Coca in Treatment: 10 Encounter Discharge Information Items Post Procedure Vitals Discharge Condition: Stable Temperature (F): 98.6 Ambulatory Status: Ambulatory Pulse (bpm): 78 Discharge Destination: Home Respiratory Rate (breaths/min): 16 Transportation: Private Auto Blood Pressure (mmHg): 148/62 Accompanied By: self Schedule Follow-up Appointment: Yes Clinical Summary of Care: Electronic Signature(s) Signed: 03/17/2022 11:57:56 AM By: Massie Kluver Entered By: Massie Kluver on 03/17/2022 11:48:50 -------------------------------------------------------------------------------- Lower Extremity Assessment Details Patient Name: Date of Service: Raymond Eke D. 03/17/2022 11:00 A M Medical Record Number: [id number] Patient Account Number: [id number] Date  of Birth/Sex: Treating RN: 1965-04-23 (57 y.o. Raymond [name] Primary Care Ovidio Steele: [name] Other Clinician: Massie Kluver Referring Bailley Guilford: Treating Berdell Nevitt/Extender: [name] Weeks in Treatment: 10 Electronic Signature(s) Signed: 03/17/2022 11:57:56 AM By: Massie Kluver Signed: 03/18/2022 1:08:07 PM By: Gretta Cool, BSN, RN, CWS, Kim RN, BSN Entered By: Massie Kluver on 03/17/2022 11:05:42 -------------------------------------------------------------------------------- Multi Wound Chart Details Patient Name: Date of Service: Raymond Eke D. 03/17/2022 11:00 A M Medical Record Number: [id number] Patient Account Number: [id number] Date of Birth/Sex: Treating RN: 28-Sep-1965 (58 y.o. Raymond [name] Primary Care Deashia Soule: [name] Other Clinician: Massie Kluver Referring Amberleigh Gerken: Treating Jalissa Heinzelman/Extender: [name] Weeks in Treatment: 10 Vital Signs Height(in): 74 Pulse(bpm): 78 Weight(lbs): 371 Blood Pressure(mmHg): 148/62 Raymond [name], Raymond [name] ([id number]) [phone].pdf Page 4 of 11 Body Mass Index(BMI): 47.6 Temperature(F): 98.6 Respiratory Rate(breaths/min): 16 [2:Photos:] [N/A:N/A] Left, Medial Metatarsal head first Left T Third oe N/A Wound Location: Gradually Appeared Footwear Injury N/A Wounding Event: Diabetic Wound/Ulcer of the Lower Diabetic Wound/Ulcer of the Lower N/A Primary Etiology: Extremity Extremity Congestive Heart Failure, Congestive Heart Failure, N/A Comorbid History: Hypertension, Type II Diabetes, Hypertension, Type II Diabetes, Neuropathy Neuropathy 12/13/2021 01/03/2022 N/A Date Acquired: 10 10 N/A Weeks of Treatment: Open Open N/A Wound Status: No No N/A Wound Recurrence: 0.1x0.1x0.1 0.1x0.3x0.1 N/A Measurements L x W x D (cm) 0.008 0.024 N/A A (cm) : rea 0.001 0.002 N/A Volume (cm) : 99.40% 96.20% N/A % Reduction in A rea: 99.70% 98.40% N/A %  Reduction in Volume: Grade 3 Grade 3 N/A Classification: Large Medium N/A Exudate A mount: Serous Serous N/A Exudate Type: amber amber N/A Exudate Color: Flat and Intact Flat and Intact N/A Wound Margin: Large (67-100%) Large (67-100%) N/A Granulation A mount: Small (1-33%) Small (1-33%) N/A Necrotic A mount: Fat Layer (Subcutaneous Tissue): Yes Fat Layer (Subcutaneous Tissue): Yes N/A Exposed Structures: Fascia: No Fascia: No Tendon: No Tendon: No Muscle: No Muscle: No Joint: No Joint: No Bone: No Bone: No None None N/A Epithelialization: Treatment Notes Electronic Signature(s) Signed: 03/17/2022 11:57:56 AM By: Massie Kluver Entered By: Massie Kluver on 03/17/2022 11:05:46 -------------------------------------------------------------------------------- Multi-Disciplinary Care Plan Details Patient Name: Date of Service: Raymond [name], Raymond Bishop M D. 03/17/2022 11:00 A  M Medical Record Number: [id number] Patient Account Number: [id number] Date of Birth/Sex: Treating RN: 1965/07/28 (57 y.o. Raymond [name] Primary Care Tinley Rought: [name] Other Clinician: Massie Kluver Referring Ksenia Kunz: Treating Rajinder Mesick/Extender: Hazle Coca in Treatment: [address] MACIE, SCHAER ([id number]) [id number] Page 5 of 11 Medication Nursing Diagnoses: Knowledge deficit related to medication safety: actual or potential Goals: Patient/caregiver will demonstrate understanding of all current medications Date Initiated: 01/03/2022 T arget Resolution Date: 03/11/2022 Goal Status: Active Interventions: Assess for medication contraindications each visit where new medications are prescribed Assess patient/caregiver ability to manage medication regimen upon admission and as needed Patient/Caregiver given reconciled medication list upon admission, changes in medications and discharge from the [place] Provide education on  medication safety Notes: Necrotic Tissue Nursing Diagnoses: Impaired tissue integrity related to necrotic/devitalized tissue Knowledge deficit related to management of necrotic/devitalized tissue Goals: Necrotic/devitalized tissue will be minimized in the wound bed Date Initiated: 01/03/2022 Target Resolution Date: 03/03/2022 Goal Status: Active Patient/caregiver will verbalize understanding of reason and process for debridement of necrotic tissue Date Initiated: 01/03/2022 Target Resolution Date: 03/03/2022 Goal Status: Active Interventions: Assess patient pain level pre-, during and post procedure and prior to discharge Provide education on necrotic tissue and debridement process Treatment Activities: Excisional debridement : 01/03/2022 Notes: Pressure Nursing Diagnoses: Knowledge deficit related to causes and risk factors for pressure ulcer development Knowledge deficit related to management of pressures ulcers Potential for impaired tissue integrity related to pressure, friction, moisture, and shear Goals: Patient will remain free from development of additional pressure ulcers Date Initiated: 01/03/2022 Target Resolution Date: 03/03/2022 Goal Status: Active Patient/caregiver will verbalize risk factors for pressure ulcer development Date Initiated: 01/03/2022 Target Resolution Date: 03/03/2022 Goal Status: Active Patient/caregiver will verbalize understanding of pressure ulcer management Date Initiated: 01/03/2022 Target Resolution Date: 03/03/2022 Goal Status: Active Interventions: Assess potential for pressure ulcer upon admission and as needed Notes: Soft Tissue Infection Nursing Diagnoses: Impaired tissue integrity Knowledge deficit related to disease process and management Knowledge deficit related to home infection control: handwashing, handling of soiled dressings, supply storage Potential for infection: soft tissue Goals: Patient will remain free of wound infection Date  Initiated: 01/03/2022 Target Resolution Date: 03/03/2022 Goal Status: Active Raymond [name], Raymond [name] ([id number]) [phone].pdf Page 6 of 11 Patient/caregiver will verbalize understanding of or measures to prevent infection and contamination in the home setting Date Initiated: 01/03/2022 Target Resolution Date: 03/03/2022 Goal Status: Active Patient's soft tissue infection will resolve Date Initiated: 01/03/2022 Date Inactivated: 03/03/2022 Target Resolution Date: 02/18/2022 Goal Status: Met Signs and symptoms of infection will be recognized early to allow for prompt treatment Date Initiated: 01/03/2022 Target Resolution Date: 03/03/2022 Goal Status: Active Interventions: Assess signs and symptoms of infection every visit Treatment Activities: Culture and sensitivity : 01/03/2022 Notes: Wound/Skin Impairment Nursing Diagnoses: Impaired tissue integrity Knowledge deficit related to ulceration/compromised skin integrity Goals: Ulcer/skin breakdown will have a volume reduction of 30% by week 4 Date Initiated: 02/18/2022 Target Resolution Date: 02/18/2022 Goal Status: Active Ulcer/skin breakdown will have a volume reduction of 50% by week 8 Date Initiated: 02/18/2022 Target Resolution Date: 03/18/2022 Goal Status: Active Interventions: Assess patient/caregiver ability to obtain necessary supplies Assess ulceration(s) every visit Treatment Activities: Consult for HBO : 02/18/2022 Referred to DME Jarissa Sheriff for dressing supplies : 02/18/2022 Skin care regimen initiated : 02/18/2022 Notes: Electronic Signature(s) Signed: 03/17/2022 11:57:56 AM By: Massie Kluver Signed: 03/18/2022 1:08:07 PM By: Gretta Cool, BSN, RN, CWS, Kim RN, BSN Entered By: Massie Kluver on 03/17/2022 11:47:57 -------------------------------------------------------------------------------- Pain  Assessment Details Patient Name: Date of Service: Raymond [name]. 03/17/2022 11:00 A M Medical Record  Number: [id number] Patient Account Number: [id number] Date of Birth/Sex: Treating RN: 02-26-1965 (57 y.o. Raymond [name] Primary Care Jerrol Helmers: [name] Other Clinician: Massie Kluver Referring Thaison Kolodziejski: Treating Marciana Uplinger/Extender: [name] Weeks in Treatment: 10 Active Problems Location of Pain Severity and Description of Pain Patient Has Paino No Site Locations [place] D ([id number]) [phone].pdf Page 7 of 11 Pain Management and Medication Current Pain Management: Electronic Signature(s) Signed: 03/17/2022 11:57:56 AM By: Massie Kluver Signed: 03/18/2022 1:08:07 PM By: Gretta Cool, BSN, RN, CWS, Kim RN, BSN Entered By: Massie Kluver on 03/17/2022 10:57:22 -------------------------------------------------------------------------------- Patient/Caregiver Education Details Patient Name: Date of Service: Raymond [name] 2/15/2024andnbsp11:00 A M Medical Record Number: [id number] Patient Account Number: [id number] Date of Birth/Gender: Treating RN: [birth year] (57 y.o. Raymond [name] Primary Care Physician: [name] Other Clinician: Massie Kluver Referring Physician: Treating Physician/Extender: Hazle Coca in Treatment: 10 Education Assessment Education Provided To: Patient Education Topics Provided Wound/Skin Impairment: Handouts: Other: continue wound care as directed Methods: Explain/Verbal Responses: State content correctly Electronic Signature(s) Signed: 03/17/2022 11:57:56 AM By: Massie Kluver Entered By: Massie Kluver on 03/17/2022 11:47:50 Raymond [name] ([id number]) [phone].pdf Page 8 of 11 -------------------------------------------------------------------------------- Wound Assessment Details Patient Name: Date of Service: Raymond [name]. 03/17/2022 11:00 A M Medical Record Number: [id number] Patient Account Number:  [id number] Date of Birth/Sex: Treating RN: 1965-08-28 (57 y.o. Raymond [name], Raymond [name] Primary Care Meggin Ola: [name] Other Clinician: Massie Kluver Referring Kendyn Zaman: Treating Brylin Stopper/Extender: [name] Weeks in Treatment: 10 Wound Status Wound Number: 2 Primary Diabetic Wound/Ulcer of the Lower Extremity Etiology: Wound Location: Left, Medial Metatarsal head first Wound Status: Open Wounding Event: Gradually Appeared Comorbid Congestive Heart Failure, Hypertension, Type II Diabetes, Date Acquired: 12/13/2021 History: Neuropathy Weeks Of Treatment: 10 Clustered Wound: No Photos Wound Measurements Length: (cm) 0.1 Width: (cm) 0.1 Depth: (cm) 0.1 Area: (cm) 0.008 Volume: (cm) 0.001 % Reduction in Area: 99.4% % Reduction in Volume: 99.7% Epithelialization: None Wound Description Classification: Grade 3 Wound Margin: Flat and Intact Exudate Amount: Large Exudate Type: Serous Exudate Color: amber Foul Odor After Cleansing: No Slough/Fibrino Yes Wound Bed Granulation Amount: Large (67-100%) Exposed Structure Necrotic Amount: Small (1-33%) Fascia Exposed: No Necrotic Quality: Adherent Slough Fat Layer (Subcutaneous Tissue) Exposed: Yes Tendon Exposed: No Muscle Exposed: No Joint Exposed: No Bone Exposed: No Treatment Notes Wound #2 (Metatarsal head first) Wound Laterality: Left, Medial Cleanser [place] Ancillary Kit - 15 Day Supply Discharge Instruction: Use supplies as instructed; Kit contains: (15) Saline Bullets; (15) 3x3 Gauze; 15 pr Gloves Leane Call D ([id number]) [id number] Page 9 of 11 Soap and Water Discharge Instruction: Gently cleanse wound with antibacterial soap, rinse and pat dry prior to dressing wounds Peri-Wound Care Topical Primary Dressing Endoform Natural, Non-fenestrated, 2x2 (in/in) Secondary Dressing ABD Pad 5x9 (in/in) Discharge Instruction: Cover with ABD pad Kerlix 4.5 x 4.1  (in/yd) Discharge Instruction: Apply Kerlix 4.5 x 4.1 (in/yd) as instructed mepitel Secured With Medipore T - 31M Medipore H Soft Cloth Surgical T ape ape, 2x2 (in/yd) Steri-Strip 0.25x4 (in/in) Discharge Instruction: Apply Steri-Strip as directed Compression Wrap Compression Stockings Add-Ons Electronic Signature(s) Signed: 03/17/2022 11:57:56 AM By: Massie Kluver Signed: 03/18/2022 1:08:07 PM By: Gretta Cool, BSN, RN, CWS, Kim RN, BSN Entered By: Massie Kluver on 03/17/2022 11:05:08 -------------------------------------------------------------------------------- Wound Assessment Details Patient Name: Date of Service: Raymond [name], Raymond Bishop M D. 03/17/2022 11:00 A  M Medical Record Number: [id number] Patient Account Number: [id number] Date of Birth/Sex: Treating RN: 1965-08-27 (57 y.o. Raymond [name] Primary Care Samary Shatz: [name] Other Clinician: Massie Kluver Referring Vu Liebman: Treating Amarianna Abplanalp/Extender: [name] Weeks in Treatment: 10 Wound Status Wound Number: 3 Primary Diabetic Wound/Ulcer of the Lower Extremity Etiology: Wound Location: Left T Third oe Wound Status: Healed - Epithelialized Wounding Event: Footwear Injury Comorbid Congestive Heart Failure, Hypertension, Type II Diabetes, Date Acquired: 01/03/2022 History: Neuropathy Weeks Of Treatment: 10 Clustered Wound: No Photos Raymond [name], Raymond [name] ([id number]) [id number] Page 10 of 11 Wound Measurements Length: (cm) Width: (cm) Depth: (cm) Area: (cm) Volume: (cm) 0 % Reduction in Area: 100% 0 % Reduction in Volume: 100% 0 Epithelialization: Large (67-100%) 0 0 Wound Description Classification: Grade 3 Wound Margin: Flat and Intact Exudate Amount: None Present Foul Odor After Cleansing: No Slough/Fibrino No Wound Bed Granulation Amount: None Present (0%) Exposed Structure Necrotic Amount: None Present (0%) Fascia Exposed: No Fat Layer (Subcutaneous  Tissue) Exposed: Yes Tendon Exposed: No Muscle Exposed: No Joint Exposed: No Bone Exposed: No Treatment Notes Wound #3 (Toe Third) Wound Laterality: Left Cleanser Peri-Wound Care Topical Primary Dressing Secondary Dressing Secured With Compression Wrap Compression Stockings Add-Ons Electronic Signature(s) Signed: 03/17/2022 11:57:56 AM By: Massie Kluver Signed: 03/18/2022 1:08:07 PM By: Gretta Cool, BSN, RN, CWS, Kim RN, BSN Entered By: Massie Kluver on 03/17/2022 11:28:44 -------------------------------------------------------------------------------- Vitals Details Patient Name: Date of Service: Raymond [name], Raymond Negus D. 03/17/2022 11:00 A M Medical Record Number: [id number] Patient Account Number: [id number] Date of Birth/Sex: Treating RN: [birth year] (57 y.o. Raymond [name], Raymond [name] Primary Care Kharon Hixon: [name] Other Clinician: Massie Kluver Referring Decarla Siemen: Treating Iva Posten/Extender: [name] Weeks in Treatment: 10 Vital Signs Time Taken: 10:56 Temperature (F): 98.6 Height (in): 74 Pulse (bpm): 78 Weight (lbs): 371 Respiratory Rate (breaths/min): 16 Body Mass Index (BMI): 47.6 Blood Pressure (mmHg): 148/62 Raymond [name], Raymond [name] ([id number]) [id number] Page 11 of 11 Reference Range: 80 - 120 mg / dl Electronic Signature(s) Signed: 03/17/2022 11:57:56 AM By: Massie Kluver Entered By: Massie Kluver on 03/17/2022 10:57:08

## 2022-03-19 NOTE — Progress Notes (Signed)
JOEY, KRAUSKOPF ([id number]) [id number] Page 1 of 2 Visit Report for 03/18/2022 Arrival Information Details Patient Name: Date of Service: [name]. 03/18/2022 8:00 A M Medical Record Number: [id number] Patient Account Number: [id number] Date of Birth/Sex: Treating RN: 1965/02/20 (57 y.o. [name] Primary Care Trinidy Masterson: [name] Other Clinician: Referring Lamoine Magallon: Treating Eulene Pekar/Extender: Hazle Coca in Treatment: 10 Visit Information History Since Last Visit Added or deleted any medications: No Patient Arrived: Ambulatory Has Dressing in Place as Prescribed: Yes Arrival Time: 07:38 Pain Present Now: No Transfer Assistance: None Patient Identification Verified: Yes Secondary Verification Process Completed: Yes Patient Requires Transmission-Based Precautions: No Patient Has Alerts: Yes Patient Alerts: Type II Diabetic Aspirin 46m ABI L [place]>220 Electronic Signature(s) Signed: 03/18/2022 8:03:36 AM By: WGretta Cool BSN, RN, CWS, Kim RN, BSN Entered By: WGretta Cool BSN, RN, CWS, Kim on 03/18/2022 08:03:36 -------------------------------------------------------------------------------- Encounter Discharge Information Details Patient Name: Date of Service: [name] 03/18/2022 8:00 A M Medical Record Number: [id number] Account Number: [id number] of Birth/Sex: Treating RN: 705-Apr-1967(57y.o. MVerl BlalockPrimary Care Virdie Penning: SElisabeth CaraOther Clinician: Referring Mattea Seger: Treating Mase Dhondt/Extender: SHazle Cocain Treatment: 10 Encounter Discharge Information Items Discharge Condition: Stable Ambulatory Status: Ambulatory Discharge Destination: Home Transportation: Other Accompanied By: self Schedule Follow-up Appointment: No Clinical Summary of Care: Notes CWAFI, SIMOES([id number] Page 2 of  2 Patient transportation provided by [facility]. Paid with hospital account. axi EEngineer, maintenance Signed: 03/18/2022 10:04:56 AM By: WGretta Cool BSN, RN, CWS, Kim RN, BSN Entered By: WGretta Cool BSN, RN, CWS, Kim on 03/18/2022 10:04:55 -------------------------------------------------------------------------------- Vitals Details Patient Name: Date of Service: [name] 03/18/2022 8:00 A M Medical Record Number: [id number] Account Number: [id number] of Birth/Sex: Treating RN: 711/02/1965(57y.o. MIsac Sarna KMaudie MercuryPrimary Care Diontay Rosencrans: SElisabeth CaraOther Clinician: Referring Keary Waterson: Treating Miguelangel Korn/Extender: SHazle Cocain Treatment: 10 Vital Signs Time Taken: 07:38 Temperature (F): 98.4 Height (in): 74 Pulse (bpm): 90 Weight (lbs): 371 Respiratory Rate (breaths/min): 16 Body Mass Index (BMI): 47.6 Blood Pressure (mmHg): 145/60 Reference Range: 80 - 120 mg / dl Electronic Signature(s) Signed: 03/18/2022 8:04:01 AM By: WGretta Cool BSN, RN, CWS, Kim RN, BSN Entered By: WGretta Cool BSN, RN, CWS, Kim on 03/18/2022 08:04:00

## 2022-03-19 NOTE — Progress Notes (Addendum)
ROANIN, KONOPA ([id number]) [id number] Page 1 of 2 Visit Report for 03/18/2022 HBO Details Patient Name: Date of Service: Raymond [name]. 03/18/2022 8:00 A M Medical Record Number: [id number] Patient Account Number: [id number] Date of Birth/Sex: Treating RN: [birth year] (57 y.o. 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 HBO Treatment Details Treatment Number: 17 Patient Type: Outpatient Chamber Type: Monoplace Chamber Serial #: [id number] Treatment Protocol: 2.0 ATA with 90 minutes oxygen, and no air breaks Treatment Details Compression Rate Down: 1.5 psi / minute De-Compression Rate Up: 1.5 psi / minute Air breaks and breathing Decompress Decompress Compress Tx Pressure Begins Reached periods Begins Ends (leave unused spaces blank) Chamber Pressure (ATA 1 2 ------2 1 ) Clock Time (24 hr) 07:58 08:10 - - - - - - 09:41 09:52 Treatment Length: 114 (minutes) Treatment Segments: 4 Vital Signs Capillary Blood Glucose Reference Range: 80 - 120 mg / dl HBO Diabetic Blood Glucose Intervention Range: <131 mg/dl or >249 mg/dl Type: Time Vitals Blood Respiratory Capillary Blood Glucose Pulse Action Pulse: Temperature: Taken: Pressure: Rate: Glucose (mg/dl): Meter #: Oximetry (%) Taken: Pre 07:38 145/60 90 16 98.4 174 treatment initiated Post 09:55 146/60 80 16 98.3 discharged home Treatment Response Treatment Toleration: Well Treatment Completion Status: Treatment Completed without Adverse Event HBO Attestation I certify that I supervised this HBO treatment in accordance with  Medicare guidelines. A trained emergency response team is readily available per Yes hospital policies and procedures. Continue HBOT as ordered. Yes Electronic Signature(s) Signed: 03/18/2022 10:02:41 AM By: Gretta Cool, BSN, RN, CWS, Kim RN, BSN Signed: 03/18/2022 1:17:52 PM By: [name] PA-C Previous Signature: 03/18/2022 9:41:59 AM Version By: Gretta Cool BSN, RN, CWS, Kim RN, BSN Previous Signature: 03/18/2022 8:45:01 AM Version By: Gretta Cool BSN, RN, CWS, Kim RN, BSN [place], [place] D ([id number]) [id number] Page 2 of 2 Previous Signature: 03/18/2022 8:45:01 AM Version By: Gretta Cool, BSN, RN, CWS, Kim RN, BSN Previous Signature: 03/18/2022 8:10:55 AM Version By: Gretta Cool, BSN, RN, CWS, Kim RN, BSN Previous Signature: 03/18/2022 8:06:13 AM Version By: Gretta Cool, BSN, RN, CWS, Kim RN, BSN Entered By: Gretta Cool, BSN, RN, CWS, Kim on 03/18/2022 10:02:41 -------------------------------------------------------------------------------- HBO Safety Checklist Details Patient Name: Date of Service: Raymond [name], Raymond Negus D. 03/18/2022 8:00 A M Medical Record Number: [id number] Patient Account Number: [id number] Date of Birth/Sex: Treating RN: 04-30-1965 (57 y.o. [name], [name] Primary Care Morayo Leven: [name] Other Clinician: Referring Katharine Rochefort: Treating Taunja Brickner/Extender: [name] Weeks in Treatment: 10 HBO Safety Checklist Items Safety Checklist Consent Form Signed Patient voided / foley secured and emptied When did you last eato am Last dose of injectable or oral agent pm Ostomy pouch emptied and vented if applicable NA All implantable devices assessed, documented and approved NA Intravenous access site secured and place NA Valuables secured Linens and cotton and cotton/polyester blend (less than 51% polyester) Personal oil-based products / skin lotions / body lotions removed Wigs or hairpieces removed NA Smoking or tobacco materials removed NA Books /  newspapers / magazines / loose paper removed NA Cologne, aftershave, perfume and deodorant removed Jewelry removed (may wrap wedding band) NA Make-up removed NA Hair care products removed Battery operated devices (external) removed NA Heating patches and chemical warmers removed NA  Titanium eyewear removed NA Nail polish cured greater than 10 hours NA Casting material cured greater than 10 hours NA Hearing aids removed NA Loose dentures or partials removed NA Prosthetics have been removed NA Patient demonstrates correct use of air break device (if applicable) Patient concerns have been addressed Patient grounding bracelet on and cord attached to chamber Specifics for Inpatients (complete in addition to above) Medication sheet sent with patient Intravenous medications needed or due during therapy sent with patient Drainage tubes (e.g. nasogastric tube or chest tube secured and vented) Endotracheal or Tracheotomy tube secured Cuff deflated of air and inflated with saline Airway suctioned Electronic Signature(s) Signed: 03/18/2022 8:04:51 AM By: Gretta Cool, BSN, RN, CWS, Kim RN, BSN Entered By: Gretta Cool, BSN, RN, CWS, Kim on 03/18/2022 08:04:50

## 2022-03-21 ENCOUNTER — Encounter: Payer: Medicare HMO | Admitting: Physician Assistant

## 2022-03-21 DIAGNOSIS — E11621 Type 2 diabetes mellitus with foot ulcer: Secondary | ICD-10-CM | POA: Diagnosis not present

## 2022-03-21 LAB — GLUCOSE, CAPILLARY
Glucose-Capillary: 189 mg/dL — ABNORMAL HIGH (ref 70–99)
Glucose-Capillary: 231 mg/dL — ABNORMAL HIGH (ref 70–99)

## 2022-03-21 NOTE — Progress Notes (Signed)
LUIAN, NERISON ([id number]) [id number] Page 1 of 2 Visit Report for 03/21/2022 Arrival Information Details Patient Name: Date of Service: [name]. 03/21/2022 8:00 A M Medical Record Number: [id number] Patient Account Number: [id number] Date of Birth/Sex: Treating RN: Nov 25, 1965 (57 y.o. [name], [name] Primary Care Reesha Debes: [name] Other Clinician: [name] Referring Tucker Steedley: Treating Gjon Letarte/Extender: Hazle Coca in Treatment: 11 Visit Information History Since Last Visit Added or deleted any medications: No Patient Arrived: Ambulatory Any new allergies or adverse reactions: No Arrival Time: 07:45 Had a fall or experienced change in No Accompanied By: self activities of daily living that may affect Transfer Assistance: None risk of falls: Patient Identification Verified: Yes Signs or symptoms of abuse/neglect since No Secondary Verification Process Completed: Yes last visito Patient Requires Transmission-Based Precautions: No Hospitalized since last visit: No Patient Has Alerts: Yes Implantable device outside of the clinic No Patient Alerts: Type II Diabetic excluding Aspirin 59m cellular tissue based products placed in the ABI L [place]>220 center since last visit: Has Dressing in Place as Prescribed: Yes Has Footwear/Offloading in Place as Yes Prescribed: Left: Surgical Shoe with Pressure Relief Insole Pain Present Now: No Electronic Signature(s) Signed: 03/21/2022 10:47:39 AM By: WEnedina FinnerRCP, RRT CHT , , Entered By: WEnedina Finneron 03/21/2022 09:49:13 , -------------------------------------------------------------------------------- Encounter Discharge Information Details Patient Name: Date of Service: [name] 03/21/2022 8:00 A M Medical Record Number: [id number] Account Number: [id number] of Birth/Sex: Treating RN: [birth year] (57y.o. MIsac Sarna KMaudie MercuryPrimary Care Venna Berberich: SElisabeth CaraOther Clinician: WJacqulyn BathReferring Rajean Desantiago: Treating Leeroy Lovings/Extender: SHazle Cocain Treatment: 11 Encounter Discharge Information Items Discharge Condition: Stable Ambulatory Status: Ambulatory Discharge Destination: Home CDESTIN, JONASSON([id number] Page 2 of 2 Transportation: Other Accompanied By: transportation Schedule Follow-up Appointment: Yes Clinical Summary of Care: Notes Patient has an HBO treatment scheduled on 03/22/22 at 08:00 am. Electronic Signature(s) Signed: 03/21/2022 10:47:39 AM By: WEnedina FinnerRCP, RRT CHT , , Entered By: WEnedina Finneron 03/21/2022 10:46:56 , -------------------------------------------------------------------------------- Vitals Details Patient Name: Date of Service: [name] 03/21/2022 8:00 A M Medical Record Number: [id number] Account Number: [id number] of Birth/Sex: Treating RN: [birth year](57y.o. MIsac Sarna KMaudie MercuryPrimary Care Kimothy Kishimoto: SElisabeth CaraOther Clinician: WJacqulyn BathReferring Koki Buxton: Treating Shalla Bulluck/Extender: SSuzan GaribaldiWeeks in Treatment: 11 Vital Signs Time Taken: 07:51 Temperature (F): 98.5 Height (in): 74 Pulse (bpm): 72 Weight (lbs): 371 Respiratory Rate (breaths/min): 18 Body Mass Index (BMI): 47.6 Blood Pressure (mmHg): 142/64 Capillary Blood Glucose (mg/dl): 189 Reference Range: 80 - 120 mg / dl Electronic Signature(s) Signed: 03/21/2022 10:47:39 AM By: WEnedina FinnerRCP, RRT CHT , , Entered By: WEnedina Finneron 03/21/2022 09:49:49 ,

## 2022-03-21 NOTE — Progress Notes (Signed)
Raymond [name] ([id number]) [id number] Page 1 of 2 Visit Report for 03/21/2022 HBO Details Patient Name: Date of Service: Raymond [name]. 03/21/2022 8:00 A M Medical Record Number: [id number] Patient Account Number: [id number] Date of Birth/Sex: Treating RN: [birth year] (57 y.o. 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 HBO Treatment Details Treatment Number: 18 Patient Type: Outpatient Chamber Type: Monoplace Chamber Serial #: [id number] Treatment Protocol: 2.0 ATA with 90 minutes oxygen, and no air breaks Treatment Details Compression Rate Down: 1.5 psi / minute De-Compression Rate Up: 1.5 psi / minute Air breaks and breathing Decompress Decompress Compress Tx Pressure Begins Reached periods Begins Ends (leave unused spaces blank) Chamber Pressure (ATA 1 2 ------2 1 ) Clock Time (24 hr) 08:09 08:20 - - - - - - 09:50 10:02 Treatment Length: 113 (minutes) Treatment Segments: 4 Vital Signs Capillary Blood Glucose Reference Range: 80 - 120 mg / dl HBO Diabetic Blood Glucose Intervention Range: <131 mg/dl or >249 mg/dl Type: Time Vitals Blood Respiratory Capillary Blood Glucose Pulse Action Pulse: Temperature: Taken: Pressure: Rate: Glucose (mg/dl): Meter #: Oximetry (%) Taken: Pre 07:51 142/64 72 18 98.5 189 1 none per protocol Post 10:06 140/64 66 18 98.3 231 1 none per protocol Treatment Response Treatment Toleration: Well Treatment Completion Status: Treatment Completed without Adverse Event Electronic Signature(s) Signed: 03/21/2022 10:47:39 AM By: Raymond [name] RCP, RRT CHT , , Signed: 03/21/2022 1:48:29 PM By: [name] PA-C Entered By: Raymond [name] on 03/21/2022 10:45:00 , Raymond [name] ([id number]) [id number] Page 2 of 2 -------------------------------------------------------------------------------- HBO Safety Checklist Details Patient Name: Date of Service: Raymond [name]. 03/21/2022 8:00 A M Medical Record Number: [id number] Patient Account Number: [id number] Date of Birth/Sex: Treating RN: 03-May-1965 (57 y.o. Raymond [name], Raymond [name] Primary Care Raymond [name]: Raymond [name] Other Clinician: Jacqulyn [name] Referring Raymond [name]: Treating Raymond [name]: Raymond [name] Weeks in Treatment: 11 HBO Safety Checklist Items Safety Checklist Consent Form Signed Patient voided / foley secured and emptied When did you last eato 03/21/22 am Last dose of injectable or oral agent 03/21/22 am metformin Ostomy pouch emptied and vented if applicable NA All implantable devices assessed, documented and approved NA Intravenous access site secured and place NA Valuables secured Linens and cotton and cotton/polyester blend (less than 51% polyester) Personal oil-based products / skin lotions / body lotions removed Wigs or hairpieces removed NA Smoking or tobacco materials removed NA Books / newspapers / magazines / loose paper removed NA Cologne, aftershave, perfume and deodorant removed Jewelry removed (may wrap wedding band) Make-up removed NA Hair care products removed Battery operated devices (external) removed NA Heating patches and chemical warmers removed NA Titanium eyewear removed NA Nail polish cured greater than 10 hours NA Casting material cured greater than 10 hours NA Hearing aids removed NA Loose dentures or partials removed NA Prosthetics have been removed NA Patient demonstrates correct use of air break device (if applicable) Patient concerns  have been addressed Patient grounding bracelet on and cord attached to chamber Specifics for Inpatients (complete in addition to above) Medication sheet sent with patient Intravenous medications needed or due during therapy sent with patient Drainage tubes (e.g. nasogastric tube or chest tube secured and vented)  Endotracheal or Tracheotomy tube secured Cuff deflated of air and inflated with saline Airway suctioned Electronic Signature(s) Signed: 03/21/2022 10:47:39 AM By: Raymond [name] RCP, RRT CHT , , Entered By: Raymond [name] on 03/21/2022 09:53:33 ,

## 2022-03-22 ENCOUNTER — Encounter: Payer: Medicare HMO | Admitting: Physician Assistant

## 2022-03-22 DIAGNOSIS — E11621 Type 2 diabetes mellitus with foot ulcer: Secondary | ICD-10-CM | POA: Diagnosis not present

## 2022-03-22 LAB — GLUCOSE, CAPILLARY
Glucose-Capillary: 279 mg/dL — ABNORMAL HIGH (ref 70–99)
Glucose-Capillary: 273 mg/dL — ABNORMAL HIGH (ref 70–99)

## 2022-03-23 ENCOUNTER — Encounter (HOSPITAL_BASED_OUTPATIENT_CLINIC_OR_DEPARTMENT_OTHER): Payer: Medicare HMO | Admitting: Internal Medicine

## 2022-03-23 DIAGNOSIS — M86372 Chronic multifocal osteomyelitis, left ankle and foot: Secondary | ICD-10-CM | POA: Diagnosis not present

## 2022-03-23 DIAGNOSIS — L97522 Non-pressure chronic ulcer of other part of left foot with fat layer exposed: Secondary | ICD-10-CM

## 2022-03-23 DIAGNOSIS — E11621 Type 2 diabetes mellitus with foot ulcer: Secondary | ICD-10-CM | POA: Diagnosis not present

## 2022-03-23 LAB — GLUCOSE, CAPILLARY
Glucose-Capillary: 252 mg/dL — ABNORMAL HIGH (ref 70–99)
Glucose-Capillary: 233 mg/dL — ABNORMAL HIGH (ref 70–99)

## 2022-03-23 NOTE — Progress Notes (Addendum)
BOAZ, MOSKO ([id number]) [id number] Page 1 of 2 Visit Report for 03/23/2022 HBO Details Patient Name: Date of Service: Raymond [name]. 03/23/2022 8:00 A M Medical Record Number: [id number] Patient Account Number: [id number] Date of Birth/Sex: Treating RN: [birth year] (57 y.o. 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 HBO Treatment Details Treatment Number: 21 Patient Type: Outpatient Chamber Type: Monoplace Chamber Serial #: [id number] Treatment Protocol: 2.0 ATA with 90 minutes oxygen, and no air breaks Treatment Details Compression Rate Down: 1.5 psi / minute De-Compression Rate Up: 1.5 psi / minute Air breaks and breathing Decompress Decompress Compress Tx Pressure Begins Reached periods Begins Ends (leave unused spaces blank) Chamber Pressure (ATA 1 2 ------2 1 ) Clock Time (24 hr) 08:42 08:55 - - - - - - 10:25 10:36 Treatment Length: 114 (minutes) Treatment Segments: 4 Vital Signs Capillary Blood Glucose Reference Range: 80 - 120 mg / dl HBO Diabetic Blood Glucose Intervention Range: <131 mg/dl or >249 mg/dl Type: Time Vitals Blood Respiratory Capillary Blood Glucose Pulse Action Pulse: Temperature: Taken: Pressure: Rate: Glucose (mg/dl): Meter #: Oximetry (%) Taken: Pre 07:35 147/60 80 18 98.2 252 treatment initiated Post 10:39 150/78 80 18 97.9 233 patient discharged Treatment Response Treatment Completion Status: Treatment Completed without Adverse Event HBO Attestation I certify that I supervised this HBO treatment in accordance with  Medicare guidelines. A trained emergency response team is readily available per Yes hospital policies and procedures. Continue HBOT as ordered. Yes Electronic Signature(s) Signed: 03/24/2022 11:02:46 AM By: Gretta Cool, BSN, RN, CWS, Kim RN, BSN Signed: 03/24/2022 1:35:30 PM By: [name] DO Previous Signature: 03/24/2022 11:01:30 AM Version By: Gretta Cool BSN, RN, CWS, Kim RN, BSN Previous Signature: 03/23/2022 4:39:06 PM Version By: [name] DO Previous Signature: 03/23/2022 8:57:19 AM Version By: Gretta Cool BSN, RN, CWS, Kim RN, BSN Previous Signature: 03/23/2022 11:08:41 AM Version By: Sherrin Daisy ([id number]) [id number] Page 2 of 2 Previous Signature: 03/23/2022 11:08:41 AM Version By: [name] DO Entered By: Gretta Cool BSN, RN, CWS, Kim on 03/24/2022 11:02:46 -------------------------------------------------------------------------------- HBO Safety Checklist Details Patient Name: Date of Service: Raymond [name], Raymond Negus D. 03/23/2022 8:00 A M Medical Record Number: [id number] Patient Account Number: [id number] Date of Birth/Sex: Treating RN: [birth year] (57 y.o. [name], [name] Primary Care Shealyn Sean: [name] Other Clinician: [name] Referring Layna Roeper: Treating Marvin Grabill/Extender: [name] in Treatment: 11 HBO Safety Checklist Items Safety Checklist Consent Form Signed Patient voided / foley secured and emptied When did you last eato am Last dose of injectable or oral agent pm Ostomy pouch emptied and vented if applicable NA All implantable devices assessed, documented and approved NA Intravenous access site secured and place NA Valuables secured NA Linens and cotton and cotton/polyester blend (less than 51% polyester) Personal oil-based products / skin lotions / body lotions removed Wigs or hairpieces removed NA Smoking or tobacco materials removed NA Books / newspapers /  magazines / loose paper removed NA Cologne, aftershave, perfume and deodorant removed Jewelry removed (may wrap wedding band) NA Make-up removed NA Hair care products removed Battery operated devices (external) removed NA Heating patches and chemical warmers removed NA Titanium eyewear removed NA Nail polish cured greater than 10  hours NA Casting material cured greater than 10 hours NA Hearing aids removed NA Loose dentures or partials removed NA Prosthetics have been removed NA Patient demonstrates correct use of air break device (if applicable) Patient concerns have been addressed Patient grounding bracelet on and cord attached to chamber Specifics for Inpatients (complete in addition to above) Medication sheet sent with patient Intravenous medications needed or due during therapy sent with patient Drainage tubes (e.g. nasogastric tube or chest tube secured and vented) Endotracheal or Tracheotomy tube secured Cuff deflated of air and inflated with saline Airway suctioned Electronic Signature(s) Signed: 03/23/2022 8:34:54 AM By: Gretta Cool, BSN, RN, CWS, Kim RN, BSN Entered By: Gretta Cool, BSN, RN, CWS, Kim on 03/23/2022 08:34:54

## 2022-03-23 NOTE — Progress Notes (Signed)
TOUA, DROGE ([id number]) [id number] Page 1 of 2 Visit Report for 03/22/2022 Arrival Information Details Patient Name: Date of Service: Raymond [name]. 03/22/2022 8:00 A M Medical Record Number: [id number] Patient Account Number: [id number] Date of Birth/Sex: Treating RN: 06-16-1965 (57 y.o. Raymond [name], Raymond Mercury Primary Care Konnor [name]: Raymond [name] Other Clinician: Jacqulyn [name] Referring Raymond [name]: Treating Raymond [name]/Extender: Raymond [name] in Treatment: 11 Visit Information History Since Last Visit Added or deleted any medications: No Patient Arrived: Ambulatory Any new allergies or adverse reactions: No Arrival Time: 07:45 Had a fall or experienced change in No Accompanied By: self activities of daily living that may affect Transfer Assistance: None risk of falls: Patient Identification Verified: Yes Signs or symptoms of abuse/neglect since No Secondary Verification Process Completed: Yes last visito Patient Requires Transmission-Based Precautions: No Hospitalized since last visit: No Patient Has Alerts: Yes Implantable device outside of the clinic No Patient Alerts: Type II Diabetic excluding Aspirin 26m cellular tissue based products placed in the ABI L [place]>220 center since last visit: Has Dressing in Place as Prescribed: Yes Has Footwear/Offloading in Place as Yes Prescribed: Left: Surgical Shoe with Pressure Relief Insole Pain Present Now: No Electronic Signature(s) Signed: 03/22/2022 10:52:54 AM By: WEnedina FinnerRCP, RRT CHT , , Entered By: WEnedina Finneron 03/22/2022 08:52:36 , -------------------------------------------------------------------------------- Encounter Discharge Information Details Patient Name: Date of Service: [name] 03/22/2022 8:00 A M Medical Record Number: [id number] Account Number: [id number] of Birth/Sex: Treating RN: 710-Oct-1967 (57y.o. MIsac [name] KMaudie MercuryPrimary Care Rayshell Goecke: SElisabeth CaraOther Clinician: WJacqulyn BathReferring Ayauna Mcnay: Treating Spencer Peterkin/Extender: SHazle Cocain Treatment: 11 Encounter Discharge Information Items Discharge Condition: Stable Ambulatory Status: Ambulatory Discharge Destination: Home CITZAEL, HOPPES([id number] [phone] Page 2 of 2 Transportation: Other Accompanied By: transportation Schedule Follow-up Appointment: Yes Clinical Summary of Care: Notes Patient has an HBO treatment scheduled on 03/23/22 at 08:00 am. Electronic Signature(s) Signed: 03/22/2022 10:52:54 AM By: WEnedina FinnerRCP, RRT CHT , , Entered By: WEnedina Finneron 03/22/2022 10:52:18 , -------------------------------------------------------------------------------- Vitals Details Patient Name: Date of Service: [name] 03/22/2022 8:00 A M Medical Record Number: [id number] Account Number: [id number] of Birth/Sex: Treating RN: 707-Apr-1967(57y.o. MIsac [name] KMaudie MercuryPrimary Care Sherise Geerdes: SElisabeth CaraOther Clinician: WJacqulyn BathReferring Oscar Hank: Treating Ferlando Lia/Extender: SSuzan GaribaldiWeeks in Treatment: 11 Vital Signs Time Taken: 07:48 Temperature (F): 98.9 Height (in): 74 Pulse (bpm): 90 Weight (lbs): 371 Respiratory Rate (breaths/min): 18 Body Mass Index (BMI): 47.6 Blood Pressure (mmHg): 145/64 Capillary Blood Glucose (mg/dl): 279 Reference Range: 80 - 120 mg / dl Electronic Signature(s) Signed: 03/22/2022 10:52:54 AM By: WEnedina FinnerRCP, RRT CHT , , Entered By: WEnedina Finneron 03/22/2022 08:53:52 ,

## 2022-03-23 NOTE — Progress Notes (Signed)
ERVIE, MOTHERSHEAD ([id number]) [id number] Page 1 of 2 Visit Report for 03/22/2022 HBO Details Patient Name: Date of Service: Raymond [name] 03/22/2022 8:00 A M Medical Record Number: [id number] Patient Account Number: [id number] Date of Birth/Sex: Treating RN: 1965-05-19 (57 y.o. 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 HBO Treatment Details Treatment Number: 19 Patient Type: Outpatient Chamber Type: Monoplace Chamber Serial #: [id number] Treatment Protocol: 2.0 ATA with 90 minutes oxygen, and no air breaks Treatment Details Compression Rate Down: 1.5 psi / minute De-Compression Rate Up: 1.5 psi / minute Air breaks and breathing Decompress Decompress Compress Tx Pressure Begins Reached periods Begins Ends (leave unused spaces blank) Chamber Pressure (ATA 1 2 ------2 1 ) Clock Time (24 hr) 08:13 08:25 - - - - - - 09:55 10:06 Treatment Length: 113 (minutes) Treatment Segments: 4 Vital Signs Capillary Blood Glucose Reference Range: 80 - 120 mg / dl HBO Diabetic Blood Glucose Intervention Range: <131 mg/dl or >249 mg/dl Type: Time Vitals Blood Respiratory Capillary Blood Glucose Pulse Action Pulse: Temperature: Taken: Pressure: Rate: Glucose (mg/dl): Meter #: Oximetry (%) Taken: Pre 07:48 145/64 90 18 98.9 279 1 none per protocol Post 10:11 142/62 72 18 98.5 273 1 none per protocol Treatment Response Treatment Toleration: Well Treatment Completion Status: Treatment Completed without Adverse Event Electronic Signature(s) Signed: 03/22/2022 10:52:54 AM By: [name] RCP, RRT CHT , , Signed: 03/22/2022 5:46:15 PM By: [name] PA-C Entered By: [name] on 03/22/2022 10:50:44 , [name] ([id number]) [id number] Page 2 of 2 -------------------------------------------------------------------------------- HBO Safety Checklist Details Patient Name: Date of Service: Raymond [name] 03/22/2022 8:00 A M Medical Record Number: [id number] Patient Account Number: [id number] Date of Birth/Sex: Treating RN: 06/19/1965 (57 y.o. [name], [name] Primary Care Kelechi Orgeron: [name] Other Clinician: [name] Referring Sheron Robin: Treating Issac Moure/Extender: [name] Weeks in Treatment: 11 HBO Safety Checklist Items Safety Checklist Consent Form Signed Patient voided / foley secured and emptied When did you last eato 03/22/22 am Last dose of injectable or oral agent 03/22/22 am metformin Ostomy pouch emptied and vented if applicable NA All implantable devices assessed, documented and approved NA Intravenous access site secured and place NA Valuables secured Linens and cotton and cotton/polyester blend (less than 51% polyester) Personal oil-based products / skin lotions / body lotions removed Wigs or hairpieces removed NA Smoking or tobacco materials removed NA Books / newspapers / magazines / loose paper removed NA Cologne, aftershave, perfume and deodorant removed Jewelry removed (may wrap wedding band) NA Make-up removed Hair care products removed Battery operated devices (external) removed NA Heating patches and chemical warmers removed NA Titanium eyewear removed NA Nail polish cured greater than 10 hours NA Casting material cured greater than 10 hours NA Hearing aids removed NA Loose dentures or partials removed NA Prosthetics have been removed NA Patient demonstrates correct use of air break device (if applicable) Patient concerns  have been addressed Patient grounding bracelet on and cord attached to chamber Specifics for Inpatients (complete in addition to above) Medication sheet sent with patient Intravenous medications needed or due during therapy sent with patient Drainage tubes (e.g. nasogastric tube or chest tube secured and vented)  Endotracheal or Tracheotomy tube secured Cuff deflated of air and inflated with saline Airway suctioned Electronic Signature(s) Signed: 03/22/2022 10:52:54 AM By: [name] RCP, RRT CHT , , Entered By: [name] on 03/22/2022 08:59:27 ,

## 2022-03-23 NOTE — Progress Notes (Addendum)
OTHELLO, ANDRADA ([id number]) [id number].pdf Page 1 of 2 Visit Report for 03/23/2022 Arrival Information Details Patient Name: Date of Service: Raymond [name]. 03/23/2022 8:00 A M Medical Record Number: [id number] Patient Account Number: [id number] Date of Birth/Sex: Treating RN: [birth year] (57 y.o. [name], [name] Primary Care Makenize Messman: [name] Other Clinician: [name] Referring Leslyn Monda: Treating Zenith Kercheval/Extender: [name] in Treatment: 11 Visit Information History Since Last Visit Has Dressing in Place as Prescribed: Yes Patient Arrived: Ambulatory Pain Present Now: No Arrival Time: 07:35 Accompanied By: self Transfer Assistance: None Patient Identification Verified: Yes Secondary Verification Process Completed: Yes Patient Requires Transmission-Based Precautions: No Patient Has Alerts: Yes Patient Alerts: Type II Diabetic Aspirin '81mg'$  ABI L [place]>220 Electronic Signature(s) Signed: 03/23/2022 8:33:12 AM By: Gretta Cool, BSN, RN, CWS, Kim RN, BSN Entered By: Gretta Cool, BSN, RN, CWS, Kim on 03/23/2022 08:33:12 -------------------------------------------------------------------------------- Encounter Discharge Information Details Patient Name: Date of Service: Raymond [name], Raymond Negus D. 03/23/2022 8:00 A M Medical Record Number: [id number] Patient Account Number: [id number] Date of Birth/Sex: Treating RN: 07-05-1965 (57 y.o. [name], [name] Primary Care Bennie Scaff: [name] Other Clinician: [name] Referring Hoy Fallert: Treating Lynx Goodrich/Extender: [name] in Treatment: 11 Encounter Discharge Information Items Discharge Condition: Stable Ambulatory Status: Ambulatory Discharge Destination: Home Transportation: Other Schedule Follow-up Appointment: Yes Clinical Summary of Care: Notes [name], [name] ([id number]) [id number] Page 2 of  2 Transportation provided by [employer] covered by [employer]. Electronic Signature(s) Signed: 03/24/2022 11:04:39 AM By: Gretta Cool, BSN, RN, CWS, Kim RN, BSN Entered By: Gretta Cool, BSN, RN, CWS, Kim on 03/24/2022 11:04:38 -------------------------------------------------------------------------------- Vitals Details Patient Name: Date of Service: Raymond [name], Raymond Negus D. 03/23/2022 8:00 A M Medical Record Number: [id number] Patient Account Number: [id number] Date of Birth/Sex: Treating RN: 1965-03-04 (57 y.o. [name], [name] Primary Care Shane Badeaux: [name] Other Clinician: [name] Referring Stephanee Barcomb: Treating Frank Novelo/Extender: [name] in Treatment: 11 Vital Signs Time Taken: 07:35 Temperature (F): 98.2 Height (in): 74 Pulse (bpm): 80 Weight (lbs): 371 Respiratory Rate (breaths/min): 18 Body Mass Index (BMI): 47.6 Blood Pressure (mmHg): 147/60 Capillary Blood Glucose (mg/dl): 252 Reference Range: 80 - 120 mg / dl Electronic Signature(s) Signed: 03/23/2022 8:33:36 AM By: Gretta Cool, BSN, RN, CWS, Kim RN, BSN Entered By: Gretta Cool, BSN, RN, CWS, Kim on 03/23/2022 08:33:36

## 2022-03-24 ENCOUNTER — Encounter: Payer: Medicare HMO | Admitting: Physician Assistant

## 2022-03-24 DIAGNOSIS — E11621 Type 2 diabetes mellitus with foot ulcer: Secondary | ICD-10-CM | POA: Diagnosis not present

## 2022-03-24 LAB — GLUCOSE, CAPILLARY
Glucose-Capillary: 240 mg/dL — ABNORMAL HIGH (ref 70–99)
Glucose-Capillary: 253 mg/dL — ABNORMAL HIGH (ref 70–99)

## 2022-03-25 ENCOUNTER — Encounter: Payer: Medicare HMO | Admitting: Physician Assistant

## 2022-03-25 DIAGNOSIS — E11621 Type 2 diabetes mellitus with foot ulcer: Secondary | ICD-10-CM | POA: Diagnosis not present

## 2022-03-25 LAB — GLUCOSE, CAPILLARY
Glucose-Capillary: 335 mg/dL — ABNORMAL HIGH (ref 70–99)
Glucose-Capillary: 263 mg/dL — ABNORMAL HIGH (ref 70–99)

## 2022-03-25 NOTE — Progress Notes (Signed)
MEILECH, LUCHSINGER ([id number]) [id number] Page 1 of 2 Visit Report for 03/24/2022 Arrival Information Details Patient Name: Date of Service: Raymond [name]. 03/24/2022 8:00 A M Medical Record Number: [id number] Patient Account Number: [id number] Date of Birth/Sex: Treating RN: February 09, 1965 (57 y.o. Raymond [name], Raymond [name] Primary Care Mohammad Granade: [name] Other Clinician: [name] Referring Rondarius Kadrmas: Treating Satcha Storlie/Extender: Hazle Coca in Treatment: 11 Visit Information History Since Last Visit Added or deleted any medications: No Patient Arrived: Ambulatory Any new allergies or adverse reactions: No Arrival Time: 08:00 Had a fall or experienced change in No Accompanied By: self activities of daily living that may affect Transfer Assistance: None risk of falls: Patient Identification Verified: Yes Signs or symptoms of abuse/neglect since No Secondary Verification Process Completed: Yes last visito Patient Requires Transmission-Based Precautions: No Hospitalized since last visit: No Patient Has Alerts: Yes Implantable device outside of the clinic No Patient Alerts: Type II Diabetic excluding Aspirin '81mg'$  cellular tissue based products placed in the ABI L [place]>220 center since last visit: Has Dressing in Place as Prescribed: Yes Has Footwear/Offloading in Place as Yes Prescribed: Left: Surgical Shoe with Pressure Relief Insole Pain Present Now: No Electronic Signature(s) Signed: 03/24/2022 12:02:37 PM By: [name] RCP, RRT CHT , , Entered By: [name] on 03/24/2022 08:30:24 , -------------------------------------------------------------------------------- Encounter Discharge Information Details Patient Name: Date of Service: Raymond [name], Raymond Negus D. 03/24/2022 8:00 A M Medical Record Number: [id number] Patient Account Number: [id number] Date of Birth/Sex: Treating RN: 1965/09/17  (57 y.o. Raymond [name], Raymond [name] Primary Care Rhoderick Farrel: [name] Other Clinician: [name] Referring Zahki Hoogendoorn: Treating Almedia Cordell/Extender: Hazle Coca in Treatment: 11 Encounter Discharge Information Items Discharge Condition: Stable Ambulatory Status: Ambulatory Discharge Destination: Home GUNAR, TRIMMER ([id number]) [id number] Page 2 of 2 Transportation: Other Accompanied By: transportation Schedule Follow-up Appointment: Yes Clinical Summary of Care: Notes Patient has an HBO treatment scheduled on 03/25/22 at 08:00 am. Electronic Signature(s) Signed: 03/24/2022 12:02:37 PM By: [name] RCP, RRT CHT , , Entered By: [name] on 03/24/2022 11:12:43 , -------------------------------------------------------------------------------- Vitals Details Patient Name: Date of Service: Raymond [name], Raymond Negus D. 03/24/2022 8:00 A M Medical Record Number: [id number] Patient Account Number: [id number] Date of Birth/Sex: Treating RN: May 22, 1965 (57 y.o. Raymond [name], Raymond [name] Primary Care Hargis Vandyne: [name] Other Clinician: [name] Referring Honest Vanleer: Treating Bryor Rami/Extender: [name] Weeks in Treatment: 11 Vital Signs Time Taken: 08:03 Temperature (F): 98.2 Height (in): 74 Pulse (bpm): 72 Weight (lbs): 371 Respiratory Rate (breaths/min): 18 Body Mass Index (BMI): 47.6 Blood Pressure (mmHg): 138/62 Capillary Blood Glucose (mg/dl): 240 Reference Range: 80 - 120 mg / dl Electronic Signature(s) Signed: 03/24/2022 12:02:37 PM By: [name] RCP, RRT CHT , , Entered By: [name] on 03/24/2022 08:31:03 ,

## 2022-03-25 NOTE — Progress Notes (Signed)
HOVHANNES, BHATNAGAR ([id number]) [id number] Page 1 of 10 Visit Report for 03/24/2022 Chief Complaint Document Details Patient Name: Date of Service: Raymond [name] 03/24/2022 11:00 A M Medical Record Number: [id number] Patient Account Number: [id number] Date of Birth/Sex: Treating RN: 1965/04/02 (57 y.o. Raymond [name] Primary Care Provider: [name] Other Clinician: Massie Kluver Referring Provider: Treating Provider/Extender: Hazle Coca in Treatment: 11 Information Obtained from: Patient Chief Complaint Left foot ulcer with chronic osteomyelitis Electronic Signature(s) Signed: 03/24/2022 10:49:34 AM By: [name] PA-C Entered By: [name] on 03/24/2022 10:49:34 -------------------------------------------------------------------------------- Debridement Details Patient Name: Date of Service: Raymond Eke D. 03/24/2022 11:00 A M Medical Record Number: [id number] Patient Account Number: [id number] Date of Birth/Sex: Treating RN: [birth year] (57 y.o. Raymond [name], Raymond [name] Primary Care Provider: [name] Other Clinician: Massie Kluver Referring Provider: Treating Provider/Extender: Hazle Coca in Treatment: 11 Debridement Performed for Assessment: Wound #2 Left,Medial Metatarsal head first Performed By: Physician Tommie Sams., PA-C Debridement Type: Debridement Severity of Tissue Pre Debridement: Fat layer exposed Level of Consciousness (Pre-procedure): Awake and Alert Pre-procedure Verification/Time Out Yes - 11:36 Taken: Start Time: 11:36 T Area Debrided (L x W): otal 0.5 (cm) x 0.5 (cm) = 0.25 (cm) Tissue and other material debrided: Viable, Non-Viable, Callus Level: Non-Viable Tissue Debridement Description: Selective/Open Wound Instrument: Curette Bleeding: Minimum Hemostasis Achieved: Pressure Response to Treatment: Procedure was tolerated well Level of  Consciousness (Post- Awake and Alert procedure): Raymond [name], Raymond [name] ([id number]) [id number] Page 2 of 10 Post Debridement Measurements of Total Wound Length: (cm) 0.1 Width: (cm) 0.1 Depth: (cm) 0.1 Volume: (cm) 0.001 Character of Wound/Ulcer Post Debridement: Stable Severity of Tissue Post Debridement: Fat layer exposed Post Procedure Diagnosis Same as Pre-procedure Electronic Signature(s) Signed: 03/24/2022 12:20:58 PM By: [name] PA-C Entered By: Massie Kluver on 03/24/2022 11:41:36 -------------------------------------------------------------------------------- HPI Details Patient Name: Date of Service: Raymond [name], Raymond Negus D. 03/24/2022 11:00 A M Medical Record Number: [id number] Patient Account Number: [id number] Date of Birth/Sex: Treating RN: 1965/03/07 (57 y.o. Raymond [name] Primary Care Provider: [name] Other Clinician: Massie Kluver Referring Provider: Treating Provider/Extender: [name] Weeks in Treatment: 11 History of Present Illness HPI Description: 09/05/17-He is seeing an initial evaluation for a left plantar foot ulcer. He has a remote history of left great toe amputation. He states that 4-6 weeks ago he noted callus formation and ulceration. He has not seen primary care regarding this. He is not currently on antibiotic therapy. He does not routinely follow with podiatry. He states diabetic foot wear will arrive early next week. The EHR shows an A1c of 9% approximate 4 months ago but he states he had one and primary care a few weeks ago but does not know the results. He is neuropathic and does not complain of any pain, he is currently wearing crocs. 09/12/17-he is here in follow up evaluation for left plantar foot ulcer. There is improvement in both appearance and measurement. We will continue with same treatment plan and he will follow up next week. 09/19/17 on evaluation today patient actually  appears to be doing excellent in regard to his ulcer on the invitation site of his left great foot plantar aspect. He's been tolerating the dressing changes without complication. In fact with the Prisma and the current measures he has been shown signs of excellent improvement week by week up to this point. We have been to breeding the wound in this seems  to have been of great benefit for him. Fortunately there is no evidence of infection. 09/26/17 on evaluation today patient appears to be doing rather well in regard to his wound. He did not know quite as much improvement this week as compared to last week. Nonetheless he still continues to show signs of improving to some degree. I do believe he may benefit from an offloading shoe. No fevers, chills, nausea, or vomiting noted at this time. 10/03/17 on evaluation today patient actually appears to be doing much better in regard to the amputation site plantar foot ulcer. Overall this appears significantly smaller even compared to previous. He's been tolerating the dressing changes without complication. He did get his diabetic shoes and I did have a look at them they appear to be fairly good. Nonetheless I do think that for the time being I would probably recommend he continue with the offloading shoe that we have been utilizing just due to the fact that with the dressing I don't know that his diabetic shoes are going to work as appropriately as far as not called an additional pressure and irritation to the area in question. He understands. 10/10/17 on evaluation today patient actually appears to be doing very well in regard to his plantar foot ulcer. He has been tolerating the dressing changes without complication. I do feel like he's making signs of good improvement and in fact of the wound bed appears to be better although it may not be significantly changed in size it appears healthier and I do believe is showing signs of improving. Nonetheless we gonna  keep working towards healing as far as that is concerned. There is no evidence of infection. 10/17/17 on evaluation today patient actually appears to be doing poorly in regard to his plantar foot ulcer. Unfortunately other than just the area where the wound is itself there appears to be a blister that communicates with the wound unfortunately. This is more lateral to the wound itself and also to the distal point of the amputation site. There does not appear to be any evidence of cellulitis spreading at the foot but I do believe some of the drainage from the site itself is. When in nature. Nonetheless this blister area I think needs to be removed in order to allow for appropriate healing of the ulcer itself I think that is gonna be difficult for it to heal otherwise. This was discussed with the patient today. 10/24/17 on evaluation today patient actually appears to be doing better compared to last week even post debridement. He has shown signs of improvement he did test positive for Staphylococcus aureus. With that being said he notes that he's not have any discomfort and in general he does feel like things seem to been doing better. Fortunately there's no additional blistering and no evidence of remaining infection at this time. No fevers, chills, nausea, or vomiting noted at this time. He has three days of antibiotic left. 10/31/17 on evaluation today patient actually appears to be doing very well in regard to his ulcer on the left foot. He has been tolerating the dressing changes ROAN, SOUCIE ([id number]) [id number] Page 3 of 10 without complication. With that being said fortunately there appears to be no evidence of infection I do think he's made progress compared to previous. No fevers, chills, nausea, or vomiting noted at this time. 11/14/17 on evaluation today patient actually appears to be doing excellent in regard to his amputation site ulcer on his foot. In  fact  this appears to be completely healed at this point. There is no evidence of infection nor underline abscess and I did thoroughly evaluate the periwound location. Overall I'm very pleased with how things appear. Readmission: 01-03-2022 upon evaluation today patient appears for reevaluation here in the clinic although it has been since 2019 that I last saw him. This again has been over 4 years. With that being said he is having an issue with a wound on his foot unfortunately. He has not had any recent x-rays he tells me at this point which is unfortunate as avoid definitely can need to get something started that can be one of the primary items to get moving forward with. With that being said I also think were probably can obtain a wound culture he is probably going to require some antibiotics at some point. I just want to make sure that we have him on the right thing when we do this. Currently the patient tells me that his wounds have been present in regard to the met head for about a month at this point. With that being said he also has a wound on the third toe which is the next toe this actually still present. This unfortunately has a lot of callus buildup around it but I think it is larger underneath than what it would appear on initial inspection. Patient does have a history of diabetes mellitus type 2, congestive heart failure, and COPD. 01-11-2022 upon evaluation patient's wounds actually are showing signs of doing about the same may be just slightly cleaner but definitely not where we want things to be as of yet. Fortunately there does not appear to be any signs of active infection locally nor systemically at this point which is great news. No fevers, chills, nausea, vomiting, or diarrhea. Of note patient has had his MRI but we do not have the results of that as of yet we are still waiting for the reading on this at this point. 01-18-2022 upon evaluation today patient presents after having  had his MRI finally complete. 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 The third and fourth digits seem to be early which is good news. Nonetheless we are still going to need to try to see about getting the infection under control he is already been on the antibiotic therapy which I think has done quite well. In fact I feel like he is showing signs of improvement already. 12/28; patient with a wound on the medial aspect of the left first metatarsal head and an area on the left third toe with slight hammer deformity. He has had a previous left first toe amputation. We are using Iodoflex on the wound. He is a diabetic a recent MRI showed osteomyelitis we have been giving him Cipro and Doxy which he appears to be tolerating well. 02-10-2022 upon evaluation today patient unfortunately has issues still with open wounds of his foot on the left. He does have known osteomyelitis at this point and that is something that we have been treating with oral antibiotics. I actually have not seen him personally since 19 December. In that time he has not had any debridement from that point until today based on what I can see as best I can tell anyway. Has been on Cipro as well as doxycycline. Nonetheless he does still seem to have open wounds today and I think that he would benefit from hyperbaric oxygen therapy. I  discussed that with him today as well. 02-18-2022 upon evaluation today patient appears to be doing about the same in regard to his wounds. Fortunately I do not see any signs of infection although he still has areas that seem to initially close down but that he has a lot of callus that still open at both locations most on the metatarsal region as well as the toe. I think that he is appropriate for proceeding with the hyperbarics and I think the sooner we get this done to try to get these wounds healed the better off he will be. 02-25-2022  upon evaluation today patient's wounds do show signs of being dry get again. We were attempting to pack and some of the alginate dressing instead of doing the collagen as everything was getting very dry but nonetheless this is still continue to be an ongoing issue. My suggestion based on what I am seeing is good to be that we actually switch to doing Xeroform which should hopefully keep this a little bit more moist and from hopefully closing up prematurely. The patient voiced understanding. 03-03-2022 upon evaluation today patient appears to be doing well with regard to his wounds things as before appear to be getting dry. We are using Xeroform to try to keep it open is much as possible but each time I see him he does tend to cover over the good news is each time he also seems to be getting a little bit smaller which is excellent. Fortunately I do not see any evidence of active infection locally nor systemically which is great news. I think this means that between the antibiotics and the hyperbarics were really on a good track here. 03-10-2022 upon evaluation today patient appears to be doing well currently in regard to his wounds. He is actually showing signs of some good improvement he is also tolerating HBO therapy very well. I do not see any evidence of active infection locally nor systemically which is great news and overall I am extremely pleased in that regard. No fevers, chills, nausea, vomiting, or diarrhea. 03-17-2022 upon evaluation today patient appears to be doing well currently in regard to his wounds in fact the toe is completely healed. The metatarsal head location though not completely healed appears to be doing much better which is great news and overall I am extremely pleased with where we stand today. 03-24-2022 upon evaluation today patient appears to be doing well currently in regard to his wounds. He has been tolerating the dressing changes without complication. Fortunately there does  not appear to be any signs of active infection locally nor systemically in fact I am not so sure this wound is very close to being healed. I did perform some debridement to clearway some of the callus and the patient tolerated this today without complication. Postdebridement the wound bed is significantly improved. Electronic Signature(s) Signed: 03/24/2022 12:12:38 PM By: [name] PA-C Entered By: [name] on 03/24/2022 12:12:38 -------------------------------------------------------------------------------- Physical Exam Details Patient Name: Date of Service: Raymond [name], Raymond Negus D. 03/24/2022 11:00 A Raymond [name], Raymond [name] ([id number]) [id number] Page 4 of 10 Medical Record Number: [id number] Patient Account Number: [id number] Date of Birth/Sex: Treating RN: 06/29/1965 (57 y.o. Raymond [name] Primary Care Provider: [name] Other Clinician: Massie Kluver Referring Provider: Treating Provider/Extender: [name] Weeks in Treatment: 32 Constitutional Well-nourished and well-hydrated in no acute distress. Respiratory normal breathing without difficulty. Psychiatric this patient is able to make decisions and demonstrates good  insight into disease process. Alert and Oriented x 3. pleasant and cooperative. Notes Upon inspection patient's wound bed did require debridement I removed mainly clot callus there was a 0.1 open area still remaining upon completion. With that being said he is very close to complete resolution which is great. The hyperbarics coupled with good wound care is really been helping him he still on the oral antibiotics as well. Electronic Signature(s) Signed: 03/24/2022 12:13:12 PM By: [name] PA-C Entered By: [name] on 03/24/2022 12:13:12 -------------------------------------------------------------------------------- Physician Orders Details Patient Name: Date of Service: Raymond Eke D. 03/24/2022 11:00 A M Medical Record Number: [id number] Patient Account Number: [id number] Date of Birth/Sex: Treating RN: Feb 09, 1965 (57 y.o. Raymond [name], Raymond [name] Primary Care Provider: [name] Other Clinician: Massie Kluver Referring Provider: Treating Provider/Extender: Hazle Coca in Treatment: 11 Verbal / Phone Orders: No Diagnosis Coding ICD-10 Coding Code Description [phone] Chronic multifocal osteomyelitis, left ankle and foot E11.621 Type 2 diabetes mellitus with foot ulcer L97.522 Non-pressure chronic ulcer of other part of left foot with fat layer exposed I50.42 Chronic combined systolic (congestive) and diastolic (congestive) heart failure J44.9 Chronic obstructive pulmonary disease, unspecified Bathing/ Shower/ Hygiene Wash wounds with antibacterial soap and water. May shower; gently cleanse wound with antibacterial soap, rinse and pat dry prior to dressing wounds Off-Loading Open toe surgical shoe Hyperbaric Oxygen Therapy Wound #2 Left,Medial Metatarsal head first Indication and location: - [place] Grade 3 ulcers of the left foot and 3rd toe If appropriate for treatment, begin HBOT per protocol: 2.0 ATA for 90 Minutes without A Breaks ir One treatment per day (delivered Monday through Friday unless otherwise specified in Special Instructions below): Total # of Treatments: - 40 A ntihistamine 30 minutes prior to HBO Treatment, difficulty clearing ears. Finger stick Blood Glucose Pre- and Post- HBOT Treatment. Follow Hyperbaric Oxygen Glycemia Protocol Raymond [name], Raymond [name] ([id number]) [id number] Page 5 of 10 Wound Treatment Wound #2 - Metatarsal head first Wound Laterality: Left, Medial Cleanser: Byram Ancillary Kit - 15 Day Supply (Generic) 3 x Per Week/30 Days Discharge Instructions: Use supplies as instructed; Kit contains: (15) Saline Bullets; (15) 3x3 Gauze; 15 pr Gloves Cleanser: Soap and  Water 3 x Per Week/30 Days Discharge Instructions: Gently cleanse wound with antibacterial soap, rinse and pat dry prior to dressing wounds Prim Dressing: Mepitel One Silicone Wound Contact Layer, 2x3 (in/in) 3 x Per Week/30 Days ary Discharge Instructions: Add to wound bed to prevent sticking. Secondary Dressing: ABD Pad 5x9 (in/in) 3 x Per Week/30 Days Discharge Instructions: Cover with ABD pad Secondary Dressing: Kerlix 4.5 x 4.1 (in/yd) 3 x Per Week/30 Days Discharge Instructions: Apply Kerlix 4.5 x 4.1 (in/yd) as instructed Secured With: Medipore T - 36M Medipore H Soft Cloth Surgical T ape ape, 2x2 (in/yd) (Generic) 3 x Per Week/30 Days Secured With: Steri-Strip 0.25x4 (in/in) 3 x Per Week/30 Days Discharge Instructions: Apply Steri-Strip as directed Phillips pre-HBO capillary blood glucose (ensure 1 physician order is in chart). A. Notify HBO physician and await physician orders. 2 If result is 70 mg/dl or below: B. If the result meets the hospital definition of a critical result, follow hospital policy. A. Give patient an 8 ounce Glucerna Shake, an 8 ounce Ensure, or 8 ounces of a Glucerna/Ensure equivalent dietary supplement*. B. Wait 30 minutes. If result is 71 mg/dl to 130 mg/dl: C. Retest patients capillary blood glucose (CBG). D. If result greater than or  equal to 110 mg/dl, proceed with HBO. If result less than 110 mg/dl, notify HBO physician and consider holding HBO. If result is 131 mg/dl to 249 mg/dl: A. Proceed with HBO. A. Notify HBO physician and await physician orders. B. It is recommended to hold HBO and do If result is 250 mg/dl or greater: blood/urine ketone testing. C. If the result meets the hospital definition of a critical result, follow hospital policy. POST-HBO GLYCEMIA INTERVENTIONS ACTION INTERVENTION Obtain post HBO capillary blood glucose  (ensure 1 physician order is in chart). A. Notify HBO physician and await physician orders. 2 If result is 70 mg/dl or below: B. If the result meets the hospital definition of a critical result, follow hospital policy. A. Give patient an 8 ounce Glucerna Shake, an 8 ounce Ensure, or 8 ounces of a Glucerna/Ensure equivalent dietary supplement*. B. Wait 15 minutes for symptoms of If result is 71 mg/dl to 100 mg/dl: hypoglycemia (i.e. nervousness, anxiety, sweating, chills, clamminess, irritability, confusion, tachycardia or dizziness). C. If patient asymptomatic, discharge patient. If patient symptomatic, repeat capillary blood glucose (CBG) and notify HBO physician. If result is 101 mg/dl to 249 mg/dl: A. Discharge patient. A. Notify HBO physician and await physician orders. B. It is recommended to do blood/urine ketone If result is 250 mg/dl or greater: testing. C. If the result meets the hospital definition of a critical result, follow hospital policy. *Juice or candies are NOT equivalent products. If patient refuses the Glucerna or Ensure, please consult the hospital dietitian for an appropriate substitute. Electronic Signature(s) Signed: 03/24/2022 12:20:58 PM By: Caro Hight ([id number]) Irean Hong [phone].pdf Page 6 of 10 Signed: 03/24/2022 12:20:58 PM By: Jenny Reichmann By: Massie Kluver on 03/24/2022 11:42:40 -------------------------------------------------------------------------------- Problem List Details Patient Name: Date of Service: Raymond Eke D. 03/24/2022 11:00 A M Medical Record Number: [id number] Patient Account Number: [id number] Date of Birth/Sex: Treating RN: 05/03/1965 (57 y.o. 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 failure 01/03/2022 No Yes J44.9 Chronic obstructive pulmonary disease, unspecified 01/03/2022 No Yes Inactive Problems Resolved Problems Electronic Signature(s) Signed: 03/24/2022 10:49:31 AM By: [name] PA-C Entered By: [name] on 03/24/2022 10:49:31 Progress Note Details -------------------------------------------------------------------------------- [name] ([id number]) [id number] Page 7 of 10 Patient Name: Date of Service: Raymond [name] 03/24/2022 11:00 A M Medical Record Number: [id number] Patient Account Number: [id number] Date of Birth/Sex: Treating RN: Apr 05, 1965 (57 y.o. Raymond [name] Primary Care Provider: [name] Other Clinician: Massie Kluver Referring Provider: Treating Provider/Extender: Hazle Coca in Treatment: 11 Subjective Chief Complaint Information obtained from Patient Left foot ulcer with chronic osteomyelitis History of Present Illness (HPI) 09/05/17-He is seeing an initial evaluation for a left plantar foot ulcer. He has a remote history of left great toe amputation. He states that 4-6 weeks ago he noted callus formation and ulceration. He has not seen primary care regarding this. He is not currently on antibiotic therapy. He does not routinely follow with podiatry. He states diabetic foot wear will arrive  early next week. The EHR shows an A1c of 9% approximate 4 months ago but he states he had one and primary care a few weeks ago but does not know the results. He is  neuropathic and does not complain of any pain, he is currently wearing crocs. 09/12/17-he is here in follow up evaluation for left plantar foot ulcer. There is improvement in both appearance and measurement. We will continue with same treatment plan and he will follow up next week. 09/19/17 on evaluation today patient actually appears to be doing excellent in regard to his ulcer on the invitation site of his left great foot plantar aspect. He's been tolerating the dressing changes without complication. In fact with the Prisma and the current measures he has been shown signs of excellent improvement week by week up to this point. We have been to breeding the wound in this seems to have been of great benefit for him. Fortunately there is no evidence of infection. 09/26/17 on evaluation today patient appears to be doing rather well in regard to his wound. He did not know quite as much improvement this week as compared to last week. Nonetheless he still continues to show signs of improving to some degree. I do believe he may benefit from an offloading shoe. No fevers, chills, nausea, or vomiting noted at this time. 10/03/17 on evaluation today patient actually appears to be doing much better in regard to the amputation site plantar foot ulcer. Overall this appears significantly smaller even compared to previous. He's been tolerating the dressing changes without complication. He did get his diabetic shoes and I did have a look at them they appear to be fairly good. Nonetheless I do think that for the time being I would probably recommend he continue with the offloading shoe that we have been utilizing just due to the fact that with the dressing I don't know that his diabetic shoes are going to work as appropriately as far as not called an additional pressure and irritation to the area in question. He understands. 10/10/17 on evaluation today patient actually appears to be doing very well in regard to his plantar  foot ulcer. He has been tolerating the dressing changes without complication. I do feel like he's making signs of good improvement and in fact of the wound bed appears to be better although it may not be significantly changed in size it appears healthier and I do believe is showing signs of improving. Nonetheless we gonna keep working towards healing as far as that is concerned. There is no evidence of infection. 10/17/17 on evaluation today patient actually appears to be doing poorly in regard to his plantar foot ulcer. Unfortunately other than just the area where the wound is itself there appears to be a blister that communicates with the wound unfortunately. This is more lateral to the wound itself and also to the distal point of the amputation site. There does not appear to be any evidence of cellulitis spreading at the foot but I do believe some of the drainage from the site itself is. When in nature. Nonetheless this blister area I think needs to be removed in order to allow for appropriate healing of the ulcer itself I think that is gonna be difficult for it to heal otherwise. This was discussed with the patient today. 10/24/17 on evaluation today patient actually appears to be doing better compared to last week even post debridement. He has shown signs of improvement he did test positive for Staphylococcus  aureus. With that being said he notes that he's not have any discomfort and in general he does feel like things seem to been doing better. Fortunately there's no additional blistering and no evidence of remaining infection at this time. No fevers, chills, nausea, or vomiting noted at this time. He has three days of antibiotic left. 10/31/17 on evaluation today patient actually appears to be doing very well in regard to his ulcer on the left foot. He has been tolerating the dressing changes without complication. With that being said fortunately there appears to be no evidence of infection I do  think he's made progress compared to previous. No fevers, chills, nausea, or vomiting noted at this time. 11/14/17 on evaluation today patient actually appears to be doing excellent in regard to his amputation site ulcer on his foot. In fact this appears to be completely healed at this point. There is no evidence of infection nor underline abscess and I did thoroughly evaluate the periwound location. Overall I'm very pleased with how things appear. Readmission: 01-03-2022 upon evaluation today patient appears for reevaluation here in the clinic although it has been since 2019 that I last saw him. This again has been over 4 years. With that being said he is having an issue with a wound on his foot unfortunately. He has not had any recent x-rays he tells me at this point which is unfortunate as avoid definitely can need to get something started that can be one of the primary items to get moving forward with. With that being said I also think were probably can obtain a wound culture he is probably going to require some antibiotics at some point. I just want to make sure that we have him on the right thing when we do this. Currently the patient tells me that his wounds have been present in regard to the met head for about a month at this point. With that being said he also has a wound on the third toe which is the next toe this actually still present. This unfortunately has a lot of callus buildup around it but I think it is larger underneath than what it would appear on initial inspection. Patient does have a history of diabetes mellitus type 2, congestive heart failure, and COPD. 01-11-2022 upon evaluation patient's wounds actually are showing signs of doing about the same may be just slightly cleaner but definitely not where we want things to be as of yet. Fortunately there does not appear to be any signs of active infection locally nor systemically at this point which is great news. No fevers, chills,  nausea, vomiting, or diarrhea. Of note patient has had his MRI but we do not have the results of that as of yet we are still waiting for the reading on this at this point. 01-18-2022 upon evaluation today patient presents after having had his MRI finally complete. 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 The third and fourth digits seem to be early which is good news. Nonetheless we are still going to need to try to see about getting the infection under control he is already been on the antibiotic therapy which I think has done quite well. In fact I feel like he is showing signs of improvement already. 12/28; patient with a wound on the medial aspect of the left first metatarsal head and an area on the left third toe with slight hammer deformity. He  has had a previous left first toe amputation. We are using Iodoflex on the wound. He is a diabetic a recent MRI showed osteomyelitis we have been giving him Cipro and Doxy which he appears to be tolerating well. 02-10-2022 upon evaluation today patient unfortunately has issues still with open wounds of his foot on the left. He does have known osteomyelitis at this point Raymond [name], Raymond [name] ([id number]) [id number] Page 8 of 10 and that is something that we have been treating with oral antibiotics. I actually have not seen him personally since 19 December. In that time he has not had any debridement from that point until today based on what I can see as best I can tell anyway. Has been on Cipro as well as doxycycline. Nonetheless he does still seem to have open wounds today and I think that he would benefit from hyperbaric oxygen therapy. I discussed that with him today as well. 02-18-2022 upon evaluation today patient appears to be doing about the same in regard to his wounds. Fortunately I do not see any signs of infection although he still has areas that  seem to initially close down but that he has a lot of callus that still open at both locations most on the metatarsal region as well as the toe. I think that he is appropriate for proceeding with the hyperbarics and I think the sooner we get this done to try to get these wounds healed the better off he will be. 02-25-2022 upon evaluation today patient's wounds do show signs of being dry get again. We were attempting to pack and some of the alginate dressing instead of doing the collagen as everything was getting very dry but nonetheless this is still continue to be an ongoing issue. My suggestion based on what I am seeing is good to be that we actually switch to doing Xeroform which should hopefully keep this a little bit more moist and from hopefully closing up prematurely. The patient voiced understanding. 03-03-2022 upon evaluation today patient appears to be doing well with regard to his wounds things as before appear to be getting dry. We are using Xeroform to try to keep it open is much as possible but each time I see him he does tend to cover over the good news is each time he also seems to be getting a little bit smaller which is excellent. Fortunately I do not see any evidence of active infection locally nor systemically which is great news. I think this means that between the antibiotics and the hyperbarics were really on a good track here. 03-10-2022 upon evaluation today patient appears to be doing well currently in regard to his wounds. He is actually showing signs of some good improvement he is also tolerating HBO therapy very well. I do not see any evidence of active infection locally nor systemically which is great news and overall I am extremely pleased in that regard. No fevers, chills, nausea, vomiting, or diarrhea. 03-17-2022 upon evaluation today patient appears to be doing well currently in regard to his wounds in fact the toe is completely healed. The metatarsal head location though  not completely healed appears to be doing much better which is great news and overall I am extremely pleased with where we stand today. 03-24-2022 upon evaluation today patient appears to be doing well currently in regard to his wounds. He has been tolerating the dressing changes without complication. Fortunately there does not appear to be any signs of active infection locally nor  systemically in fact I am not so sure this wound is very close to being healed. I did perform some debridement to clearway some of the callus and the patient tolerated this today without complication. Postdebridement the wound bed is significantly improved. Objective Constitutional Well-nourished and well-hydrated in no acute distress. Vitals Time Taken: 10:59 AM, Height: 74 in, Weight: 371 lbs, BMI: 47.6, Temperature: 98.3 F, Pulse: 72 bpm, Respiratory Rate: 18 breaths/min, Blood Pressure: 130/60 mmHg. Respiratory normal breathing without difficulty. Psychiatric this patient is able to make decisions and demonstrates good insight into disease process. Alert and Oriented x 3. pleasant and cooperative. General Notes: Upon inspection patient's wound bed did require debridement I removed mainly clot callus there was a 0.1 open area still remaining upon completion. With that being said he is very close to complete resolution which is great. The hyperbarics coupled with good wound care is really been helping him he still on the oral antibiotics as well. Integumentary (Hair, Skin) Wound #2 status is Open. Original cause of wound was Gradually Appeared. The date acquired was: 12/13/2021. The wound has been in treatment 11 weeks. The wound is located on the Left,Medial Metatarsal head first. The wound measures 0.1cm length x 0.1cm width x 0.1cm depth; 0.008cm^2 area and 0.001cm^3 volume. There is Fat Layer (Subcutaneous Tissue) exposed. There is a large amount of serous drainage noted. The wound margin is flat and intact.  There is large (67-100%) granulation within the wound bed. There is a small (1-33%) amount of necrotic tissue within the wound bed including Adherent Slough. 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 There was a Selective/Open Wound Non-Viable Tissue Debridement with a total area of 0.25 sq cm performed by Tommie Sams., PA-C. With the following instrument(s): Curette to remove Viable and Non-Viable tissue/material. Material removed includes Callus. A time out was conducted at 11:36, prior to the start of the procedure. A Minimum amount of bleeding was controlled with Pressure. The procedure was tolerated well. Post Debridement Measurements: 0.1cm length x 0.1cm width x 0.1cm depth; 0.001cm^3 volume. Character of Wound/Ulcer Post Debridement is stable. Severity of Tissue Post Debridement is: Fat layer exposed. Post procedure Diagnosis Wound #2: Same as Pre-Procedure Plan Bathing/ Shower/ Hygiene: Wash wounds with antibacterial soap and water. May shower; gently cleanse wound with antibacterial soap, rinse and pat dry prior to dressing wounds Off-Loading: Open toe surgical shoe Hyperbaric Oxygen Therapy: Wound #2 Left,Medial Metatarsal head first: Indication and location: - Wagoner Grade 3 ulcers of the left foot and 3rd toe If appropriate for treatment, begin HBOT per protocol: 2.0 ATA for 90 Minutes  without Air Breaks One treatment per day (delivered Monday through Friday unless otherwise specified in Special Instructions below): T # of Treatments: - 40 otal Antihistamine 30 minutes prior to HBO Treatment, difficulty clearing ears. Finger stick Blood Glucose Pre- and Post- HBOT Treatment. Follow Hyperbaric Oxygen Glycemia Protocol WOUND #2: - Metatarsal head first Wound Laterality: Left, Medial Cleanser: Byram Ancillary Kit - 15 Day Supply (Generic) 3 x Per Week/30 Days Discharge  Instructions: Use supplies as instructed; Kit contains: (15) Saline Bullets; (15) 3x3 Gauze; 15 pr Gloves Cleanser: Soap and Water 3 x Per Week/30 Days Discharge Instructions: Gently cleanse wound with antibacterial soap, rinse and pat dry prior to dressing wounds Prim Dressing: Mepitel One Silicone Wound Contact Layer, 2x3 (in/in) 3 x Per Week/30 Days ary Discharge Instructions: Add to wound bed to prevent sticking. Secondary Dressing: ABD Pad 5x9 (in/in) 3 x Per Week/30 Days Discharge Instructions: Cover with ABD pad Secondary Dressing: Kerlix 4.5 x 4.1 (in/yd) 3 x Per Week/30 Days Discharge Instructions: Apply Kerlix 4.5 x 4.1 (in/yd) as instructed Secured With: Medipore T - 40M Medipore H Soft Cloth Surgical T ape ape, 2x2 (in/yd) (Generic) 3 x Per Week/30 Days Secured With: Steri-Strip 0.25x4 (in/in) 3 x Per Week/30 Days Discharge Instructions: Apply Steri-Strip as directed 1. Based on what I am seeing currently I do believe that the patient is very close to complete resolution. With that being said I really do not want to prematurely stop his hyperbarics due to the fact that is doing so well for him the patient is in agreement with the plan. With that being said I am keeping him on the oral antibiotics I also would like to keep him on the hyperbarics to completion of the 40 treatments in order to ensure that this completely resolves. I do not want him to have issues like he has before with this returning  and coming back especially since he actually had issues with recurrent ulcers at the site following the previous amputation surgery. This of course was a reopening of that ray amputation. Nonetheless the patient tells me that he is definitely good with continuing the hyperbarics and I think that the medical situation here warrants it. We will therefore continue to completion of the 40 treatments which is considered the appropriate way to go as far as treating osteomyelitis with a Wagner grade 3 ulcer. 2. I would recommend that we just continue with Mepitel to cover for the time being secured with Steri-Strips and we will see where things stand at follow-up. We will see patient back for reevaluation in 1 week here in the clinic. If anything worsens or changes patient will contact our office for additional recommendations. Electronic Signature(s) Signed: 03/24/2022 12:14:47 PM By: [name] PA-C Entered By: [name] on 03/24/2022 12:14:47 -------------------------------------------------------------------------------- SuperBill Details Patient Name: Date of Service: Raymond [name], Raymond Negus D. 03/24/2022 Medical Record Number: [id number] Patient Account Number: [id number] Date of Birth/Sex: Treating RN: May 13, 1965 (57 y.o. Raymond [name] Primary Care Provider: [name] Other Clinician: Massie Kluver Referring Provider: Treating Provider/Extender: [name] [place] ([id number]) [phone].pdf Page 10 of 10 Weeks in Treatment: 11 Diagnosis Coding ICD-10 Codes Code Description [phone] Chronic multifocal osteomyelitis, left ankle and foot E11.621 Type 2 diabetes mellitus with foot ulcer L97.522 Non-pressure chronic ulcer of other part of left foot with fat layer exposed I50.42 Chronic combined systolic (congestive) and diastolic (congestive) heart failure J44.9 Chronic obstructive pulmonary disease, unspecified Facility  Procedures : CPT4 Code: [id number] Description: [id number] - DEBRIDE WOUND 1ST 20 SQ CM OR < ICD-10 Diagnosis Description L97.522 Non-pressure chronic ulcer of other part of left foot with fat layer exposed Modifier: Quantity: 1 Physician Procedures : CPT4 Code Description Modifier [id number] - WC PHYS DEBR WO ANESTH 20 SQ CM ICD-10 Diagnosis Description L97.522 Non-pressure chronic ulcer of other part of left foot with fat layer exposed Quantity: 1 Electronic Signature(s) Signed: 03/24/2022 12:19:09  PM By: [name] PA-C Entered By: [name] on 03/24/2022 12:19:09

## 2022-03-25 NOTE — Progress Notes (Signed)
CRIAG, FURNISH ([id number]) [id number] Page 1 of 2 Visit Report for 03/24/2022 HBO Details Patient Name: Date of Service: Raymond [name]. 03/24/2022 8:00 A M Medical Record Number: [id number] Patient Account Number: [id number] Date of Birth/Sex: Treating RN: [birth year] (57 y.o. 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 HBO Treatment Details Treatment Number: 21 Patient Type: Outpatient Chamber Type: Monoplace Chamber Serial #: [id number] Treatment Protocol: 2.0 ATA with 90 minutes oxygen, and no air breaks Treatment Details Compression Rate Down: 1.5 psi / minute De-Compression Rate Up: 1.5 psi / minute Air breaks and breathing Decompress Decompress Compress Tx Pressure Begins Reached periods Begins Ends (leave unused spaces blank) Chamber Pressure (ATA 1 2 ------2 1 ) Clock Time (24 hr) 08:17 08:29 - - - - - - 10:00 10:10 Treatment Length: 113 (minutes) Treatment Segments: 4 Vital Signs Capillary Blood Glucose Reference Range: 80 - 120 mg / dl HBO Diabetic Blood Glucose Intervention Range: <131 mg/dl or >249 mg/dl Type: Time Vitals Blood Respiratory Capillary Blood Glucose Pulse Action Pulse: Temperature: Taken: Pressure: Rate: Glucose (mg/dl): Meter #: Oximetry (%) Taken: Pre 08:03 138/62 72 18 98.2 240 1 none per protocol Post 10:15 130/60 72 18 98.3 253 1 none per protocol Treatment Response Treatment Toleration: Well Treatment Completion Status: Treatment Completed without Adverse Event Electronic Signature(s) Signed: 03/25/2022 12:25:59 PM By: [name] RCP, RRT CHT , , Signed: 03/25/2022 1:45:46 PM By: [name] PA-C Previous Signature: 03/24/2022 12:02:37 PM Version By: [name] RCP, RRT CHT , , Previous Signature: 03/24/2022 12:20:58 PM Version By: [name] PA-C Entered By: [name] on 03/25/2022 12:23:20 , [name] ([id number]) [id number] Page 2 of 2 -------------------------------------------------------------------------------- HBO Safety Checklist Details Patient Name: Date of Service: Raymond [name]. 03/24/2022 8:00 A M Medical Record Number: [id number] Patient Account Number: [id number] Date of Birth/Sex: Treating RN: 07-24-1965 (57 y.o. [name], [name] Primary Care Fatime Biswell: [name] Other Clinician: [name] Referring Suki Crockett: Treating Alder Murri/Extender: [name] Weeks in Treatment: 11 HBO Safety Checklist Items Safety Checklist Consent Form Signed Patient voided / foley secured and emptied When did you last eato 03/24/22 am Last dose of injectable or oral agent 03/24/22/am metformin Ostomy pouch emptied and vented if applicable NA All implantable devices assessed, documented and approved NA Intravenous access site secured and place NA Valuables secured Linens and cotton and cotton/polyester blend (less than 51% polyester) Personal oil-based products / skin lotions / body lotions removed Wigs or hairpieces removed NA Smoking or tobacco materials removed NA Books / newspapers / magazines / loose paper removed NA Cologne, aftershave, perfume and deodorant removed Jewelry removed (may wrap wedding band) Make-up removed NA Hair care products removed Battery operated devices (external) removed NA Heating patches and chemical warmers removed NA Titanium eyewear removed NA Nail polish cured greater than 10 hours NA Casting material cured greater than 10 hours NA Hearing aids  removed NA Loose dentures or partials removed NA Prosthetics have been removed NA Patient demonstrates correct use of air break device (if applicable) Patient concerns have been addressed Patient grounding bracelet on and cord attached to chamber Specifics for Inpatients (complete in addition to above) Medication  sheet sent with patient Intravenous medications needed or due during therapy sent with patient Drainage tubes (e.g. nasogastric tube or chest tube secured and vented) Endotracheal or Tracheotomy tube secured Cuff deflated of air and inflated with saline Airway suctioned Electronic Signature(s) Signed: 03/24/2022 12:02:37 PM By: [name] RCP, RRT CHT , , Entered By: [name] on 03/24/2022 08:32:59 ,

## 2022-03-25 NOTE — Progress Notes (Signed)
Raymond [name], Raymond [name] ([id number]) [id number] Page 1 of 10 Visit Report for 03/24/2022 Arrival Information Details Patient Name: Date of Service: Raymond [name] 03/24/2022 11:00 A M Medical Record Number: [id number] Patient Account Number: [id number] Date of Birth/Sex: Treating RN: 10-Jun-1965 (57 y.o. Raymond [name] Primary Care Shatiqua Heroux: [name] Other Clinician: Massie Kluver Referring Raine Blodgett: Treating Lylla Eifler/Extender: Hazle Coca in Treatment: 11 Visit Information History Since Last Visit All ordered tests and consults were completed: No Patient Arrived: Ambulatory Added or deleted any medications: No Arrival Time: 11:25 Any new allergies or adverse reactions: No Transfer Assistance: None Had a fall or experienced change in No Patient Identification Verified: Yes activities of daily living that may affect Secondary Verification Process Completed: Yes risk of falls: Patient Requires Transmission-Based Precautions: No Signs or symptoms of abuse/neglect since No Patient Has Alerts: Yes last visito Patient Alerts: Type II Diabetic Hospitalized since last visit: No Aspirin '81mg'$  Implantable device outside of the clinic No ABI L [place]>220 excluding cellular tissue based products placed in the center since last visit: Has Dressing in Place as Prescribed: Yes Has Footwear/Offloading in Place as Yes Prescribed: Left: Surgical Shoe with Pressure Relief Insole Pain Present Now: No Electronic Signature(s) Unsigned Entered ByMassie Kluver on 03/24/2022 11:28:38 -------------------------------------------------------------------------------- Clinic Level of Care Assessment Details Patient Name: Date of Service: Raymond [name] 03/24/2022 11:00 A M Medical Record Number: [id number] Patient Account Number: [id number] Date of Birth/Sex: Treating RN: January 13, 1966 (57 y.o. Raymond [name] Primary Care Kessie Croston:  [name] Other Clinician: Massie Kluver Referring Shaya Altamura: Treating Blayton Huttner/Extender: Hazle Coca in Treatment: [facility] Level of Care Assessment Items Raymond [name], Raymond [name] ([id number]) [id number].pdf Page 2 of 10 TOOL 1 Quantity Score '[]'$  - 0 Use when EandM and Procedure is performed on INITIAL visit ASSESSMENTS - Nursing Assessment / Reassessment '[]'$  - 0 General Physical Exam (combine w/ comprehensive assessment (listed just below) when performed on new pt. evals) '[]'$  - 0 Comprehensive Assessment (HX, ROS, Risk Assessments, Wounds Hx, etc.) ASSESSMENTS - Wound and Skin Assessment / Reassessment '[]'$  - 0 Dermatologic / Skin Assessment (not related to wound area) ASSESSMENTS - Ostomy and/or Continence Assessment and Care '[]'$  - 0 Incontinence Assessment and Management '[]'$  - 0 Ostomy Care Assessment and Management (repouching, etc.) PROCESS - Coordination of Care '[]'$  - 0 Simple Patient / Family Education for ongoing care '[]'$  - 0 Complex (extensive) Patient / Family Education for ongoing care '[]'$  - 0 Staff obtains Programmer, systems, Records, T Results / Process Orders est '[]'$  - 0 Staff telephones HHA, Nursing Homes / Clarify orders / etc '[]'$  - 0 Routine Transfer to another Facility (non-emergent condition) '[]'$  - 0 Routine Hospital Admission (non-emergent condition) '[]'$  - 0 New Admissions / Biomedical engineer / Ordering NPWT Apligraf, etc. , '[]'$  - 0 Emergency Hospital Admission (emergent condition) PROCESS - Special Needs '[]'$  - 0 Pediatric / Minor Patient Management '[]'$  - 0 Isolation Patient Management '[]'$  - 0 Hearing / Language / Visual special needs '[]'$  - 0 Assessment of Community assistance (transportation, D/C planning, etc.) '[]'$  - 0 Additional assistance / Altered mentation '[]'$  - 0 Support Surface(s) Assessment (bed, cushion, seat, etc.) INTERVENTIONS - Miscellaneous '[]'$  - 0 External ear exam '[]'$  - 0 Patient Transfer  (multiple staff / Civil Service fast streamer / Similar devices) '[]'$  - 0 Simple Staple / Suture removal (25 or less) '[]'$  - 0 Complex Staple / Suture removal (26 or more) '[]'$  - 0 Hypo/Hyperglycemic Management (do not check  if billed separately) '[]'$  - 0 Ankle / Brachial Index (ABI) - do not check if billed separately Has the patient been seen at the hospital within the last three years: Yes Total Score: 0 Level Of Care: ____ Electronic Signature(s) Unsigned Entered By: Massie Kluver on 03/24/2022 11:42:56 Signature(s): Raymond [name] ([id number]) [id number] Date(s): [id number].pdf Page 3 of 10 -------------------------------------------------------------------------------- Encounter Discharge Information Details Patient Name: Date of Service: Raymond [name] 03/24/2022 11:00 A M Medical Record Number: [id number] Patient Account Number: [id number] Date of Birth/Sex: Treating RN: 1965-05-31 (57 y.o. Raymond [name] Primary Care Kash Mothershead: [name] Other Clinician: Massie Kluver Referring Danilynn Jemison: Treating Aleck Locklin/Extender: Hazle Coca in Treatment: 11 Encounter Discharge Information Items Post Procedure Vitals Discharge Condition: Stable Temperature (F): 98.3 Ambulatory Status: Ambulatory Pulse (bpm): 72 Discharge Destination: Home Respiratory Rate (breaths/min): 18 Transportation: Private Auto Blood Pressure (mmHg): 130/66 Accompanied By: self Schedule Follow-up Appointment: Yes Clinical Summary of Care: Electronic Signature(s) Unsigned Entered ByMassie Kluver on 03/24/2022 13:20:50 -------------------------------------------------------------------------------- Lower Extremity Assessment Details Patient Name: Date of Service: Raymond [name] 03/24/2022 11:00 A M Medical Record Number: [id number] Patient Account Number: [id number] Date of Birth/Sex: Treating RN: [birth year] (57 y.o. Raymond [name] Primary Care Erickson Yamashiro:  [name] Other Clinician: Massie Kluver Referring Sacora Hawbaker: Treating Karra Pink/Extender: [name] Weeks in Treatment: 11 Vascular Assessment Pulses: Dorsalis Pedis Palpable: [Left:Yes] Electronic Signature(s) Unsigned Entered By: Massie Kluver on 03/24/2022 11:33:40 Signature(s): Raymond [name], Raymond [name] ([id number]) [id number] Date(s): [id number] Page 4 of 10 -------------------------------------------------------------------------------- Multi Wound Chart Details Patient Name: Date of Service: Raymond [name]. 03/24/2022 11:00 A M Medical Record Number: [id number] Patient Account Number: [id number] Date of Birth/Sex: Treating RN: [birth year] (57 y.o. Raymond [name] Primary Care Emmerson Taddei: [name] Other Clinician: Massie Kluver Referring Eugean Arnott: Treating Sheretha Shadd/Extender: [name] Weeks in Treatment: 11 Vital Signs Height(in): 74 Pulse(bpm): 72 Weight(lbs): [id number] Blood Pressure(mmHg): 130/60 Body Mass Index(BMI): 47.6 Temperature(F): 98.3 Respiratory Rate(breaths/min): 18 [2:Photos:] [N/A:N/A] Left, Medial Metatarsal head first N/A N/A Wound Location: Gradually Appeared N/A N/A Wounding Event: Diabetic Wound/Ulcer of the Lower N/A N/A Primary Etiology: Extremity Congestive Heart Failure, N/A N/A Comorbid History: Hypertension, Type II Diabetes, Neuropathy 12/13/2021 N/A N/A Date Acquired: 11 N/A N/A Weeks of Treatment: Open N/A N/A Wound Status: No N/A N/A Wound Recurrence: 0.1x0.1x0.1 N/A N/A Measurements L x W x D (cm) 0.008 N/A N/A A (cm) : rea 0.001 N/A N/A Volume (cm) : 99.40% N/A N/A % Reduction in A rea: 99.70% N/A N/A % Reduction in Volume: Grade 3 N/A N/A Classification: Large N/A N/A Exudate A mount: Serous N/A N/A Exudate Type: amber N/A N/A Exudate Color: Flat and Intact N/A N/A Wound Margin: Large (67-100%) N/A N/A Granulation A mount: Small (1-33%)  N/A N/A Necrotic A mount: Fat Layer (Subcutaneous Tissue): Yes N/A N/A Exposed Structures: Fascia: No Tendon: No Muscle: No Joint: No Bone: No None N/A N/A Epithelialization: Treatment Notes Electronic Signature(s) Unsigned Entered ByMassie Kluver on 03/24/2022 11:33:55 Signature(s): Raymond [name] ([id number]) [phone] Date(s): [id number] Page 5 of 10 -------------------------------------------------------------------------------- Multi-Disciplinary Care Plan Details Patient Name: Date of Service: Raymond [name] 03/24/2022 11:00 A M Medical Record Number: [id number] Patient Account Number: [id number] Date of Birth/Sex: Treating RN: September 20, 1965 (57 y.o. Raymond [name] Primary Care Danikah Budzik: [name] Other Clinician: Massie Kluver Referring Carolena Fairbank: Treating Arie Powell/Extender: [name] Weeks in Treatment: 11 Active Inactive Medication Nursing Diagnoses: Knowledge deficit related to medication safety:  actual or potential Goals: Patient/caregiver will demonstrate understanding of all current medications Date Initiated: 01/03/2022 T arget Resolution Date: 03/11/2022 Goal Status: Active Interventions: Assess for medication contraindications each visit where new medications are prescribed Assess patient/caregiver ability to manage medication regimen upon admission and as needed Patient/Caregiver given reconciled medication list upon admission, changes in medications and discharge from the [place] Provide education on medication safety Notes: Necrotic Tissue Nursing Diagnoses: Impaired tissue integrity related to necrotic/devitalized tissue Knowledge deficit related to management of necrotic/devitalized tissue Goals: Necrotic/devitalized tissue will be minimized in the wound bed Date Initiated: 01/03/2022 Target Resolution Date: 03/03/2022 Goal Status: Active Patient/caregiver will verbalize understanding of  reason and process for debridement of necrotic tissue Date Initiated: 01/03/2022 Target Resolution Date: 03/03/2022 Goal Status: Active Interventions: Assess patient pain level pre-, during and post procedure and prior to discharge Provide education on necrotic tissue and debridement process Treatment Activities: Excisional debridement : 01/03/2022 Notes: Pressure Nursing Diagnoses: Knowledge deficit related to causes and risk factors for pressure ulcer development Knowledge deficit related to management of pressures ulcers Potential for impaired tissue integrity related to pressure, friction, moisture, and shear GoalsLARI, Raymond [name] ([id number]) [id number] Page 6 of 10 Patient will remain free from development of additional pressure ulcers Date Initiated: 01/03/2022 Target Resolution Date: 03/03/2022 Goal Status: Active Patient/caregiver will verbalize risk factors for pressure ulcer development Date Initiated: 01/03/2022 Target Resolution Date: 03/03/2022 Goal Status: Active Patient/caregiver will verbalize understanding of pressure ulcer management Date Initiated: 01/03/2022 Target Resolution Date: 03/03/2022 Goal Status: Active Interventions: Assess potential for pressure ulcer upon admission and as needed Notes: Soft Tissue Infection Nursing Diagnoses: Impaired tissue integrity Knowledge deficit related to disease process and management Knowledge deficit related to home infection control: handwashing, handling of soiled dressings, supply storage Potential for infection: soft tissue Goals: Patient will remain free of wound infection Date Initiated: 01/03/2022 Target Resolution Date: 03/03/2022 Goal Status: Active Patient/caregiver will verbalize understanding of or measures to prevent infection and contamination in the home setting Date Initiated: 01/03/2022 Target Resolution Date: 03/03/2022 Goal Status: Active Patient's soft tissue infection will  resolve Date Initiated: 01/03/2022 Date Inactivated: 03/03/2022 Target Resolution Date: 02/18/2022 Goal Status: Met Signs and symptoms of infection will be recognized early to allow for prompt treatment Date Initiated: 01/03/2022 Target Resolution Date: 03/03/2022 Goal Status: Active Interventions: Assess signs and symptoms of infection every visit Treatment Activities: Culture and sensitivity : 01/03/2022 Notes: Wound/Skin Impairment Nursing Diagnoses: Impaired tissue integrity Knowledge deficit related to ulceration/compromised skin integrity Goals: Ulcer/skin breakdown will have a volume reduction of 30% by week 4 Date Initiated: 02/18/2022 Target Resolution Date: 02/18/2022 Goal Status: Active Ulcer/skin breakdown will have a volume reduction of 50% by week 8 Date Initiated: 02/18/2022 Target Resolution Date: 03/18/2022 Goal Status: Active Interventions: Assess patient/caregiver ability to obtain necessary supplies Assess ulceration(s) every visit Treatment Activities: Consult for HBO : 02/18/2022 Referred to DME Anab Vivar for dressing supplies : 02/18/2022 Skin care regimen initiated : 02/18/2022 Notes: Electronic Signature(s) Unsigned Entered By: Massie Kluver on 03/24/2022 11:53:50 Signature(s): Raymond [name] ([id number]) [id number] Date(s): [id number] Page 7 of 10 -------------------------------------------------------------------------------- Pain Assessment Details Patient Name: Date of Service: Raymond [name]. 03/24/2022 11:00 A M Medical Record Number: [id number] Patient Account Number: [id number] Date of Birth/Sex: Treating RN: 09-04-1965 (57 y.o. Raymond [name] Primary Care Talise Sligh: [name] Other Clinician: Massie Kluver Referring Shron Ozer: Treating Deronda Christian/Extender: [name] Weeks in Treatment: 11 Active Problems Location of Pain Severity and Description of Pain Patient  Has Paino No Site  Locations Pain Management and Medication Current Pain Management: Electronic Signature(s) Signed: 03/24/2022 11:31:26 AM By: Gretta Cool, BSN, RN, CWS, Kim RN, BSN Entered By: Massie Kluver on 03/24/2022 11:28:52 -------------------------------------------------------------------------------- Patient/Caregiver Education Details Patient Name: Date of Service: Raymond [name] 2/22/2024andnbsp11:00 A M Medical Record Number: [id number] Patient Account Number: [id number] Date of Birth/Gender: Treating RN: 1965/12/17 (57 y.o. Raymond [name] Primary Care Physician: [name] Other Clinician: Massie Kluver Referring Physician: Treating Physician/Extender: Hazle Coca in Treatment: [id number], [place] ([id number]) [phone].pdf Page 8 of 10 Education Assessment Education Provided To: Patient Education Topics Provided Wound/Skin Impairment: Handouts: Other: continue wound care as directed Methods: Explain/Verbal Responses: State content correctly Electronic Signature(s) Unsigned Entered By: Massie Kluver on 03/24/2022 13:19:14 -------------------------------------------------------------------------------- Wound Assessment Details Patient Name: Date of Service: Raymond [name] 03/24/2022 11:00 A M Medical Record Number: [id number] Patient Account Number: [id number] Date of Birth/Sex: Treating RN: May 10, 1965 (57 y.o. [name], [name] Primary Care Lanetta Figuero: [name] Other Clinician: Massie Kluver Referring Leveon Pelzer: Treating Sadarius Norman/Extender: [name] Weeks in Treatment: 11 Wound Status Wound Number: 2 Primary Diabetic Wound/Ulcer of the Lower Extremity Etiology: Wound Location: Left, Medial Metatarsal head first Wound Status: Open Wounding Event: Gradually Appeared Comorbid Congestive Heart Failure, Hypertension, Type II Diabetes, Date Acquired: 12/13/2021 History: Neuropathy Weeks  Of Treatment: 11 Clustered Wound: No Photos Wound Measurements Length: (cm) 0.1 Width: (cm) 0.1 Depth: (cm) 0.1 Area: (cm) 0.008 Volume: (cm) 0.001 % Reduction in Area: 99.4% % Reduction in Volume: 99.7% Epithelialization: None Wound Description Classification: Grade 3 Raymond [name], Raymond [name] ([id number]) Wound Margin: Flat and Int Exudate Amount: Large Exudate Type: Serous Exudate Color: amber Foul Odor After Cleansing: No [id number] Page 9 of 10 act Slough/Fibrino Yes Wound Bed Granulation Amount: Large (67-100%) Exposed Structure Necrotic Amount: Small (1-33%) Fascia Exposed: No Necrotic Quality: Adherent Slough Fat Layer (Subcutaneous Tissue) Exposed: Yes Tendon Exposed: No Muscle Exposed: No Joint Exposed: No Bone Exposed: No Treatment Notes Wound #2 (Metatarsal head first) Wound Laterality: Left, Medial Cleanser [place] Ancillary Kit - 15 Day Supply Discharge Instruction: Use supplies as instructed; Kit contains: (15) Saline Bullets; (15) 3x3 Gauze; 15 pr Gloves Soap and Water Discharge Instruction: Gently cleanse wound with antibacterial soap, rinse and pat dry prior to dressing wounds Peri-Wound Care Topical Primary Dressing Mepitel One Silicone Wound Contact Layer, 2x3 (in/in) Discharge Instruction: Add to wound bed to prevent sticking. Secondary Dressing ABD Pad 5x9 (in/in) Discharge Instruction: Cover with ABD pad Kerlix 4.5 x 4.1 (in/yd) Discharge Instruction: Apply Kerlix 4.5 x 4.1 (in/yd) as instructed Secured With Medipore T - 48M Medipore H Soft Cloth Surgical T ape ape, 2x2 (in/yd) Steri-Strip 0.25x4 (in/in) Discharge Instruction: Apply Steri-Strip as directed Compression Wrap Compression Stockings Add-Ons Electronic Signature(s) Unsigned Entered By: Massie Kluver on 03/24/2022 11:33:07 -------------------------------------------------------------------------------- Vitals Details Patient Name: Date of  Service: Raymond Eke D. 03/24/2022 11:00 A M Medical Record Number: [id number] Patient Account Number: [id number] Date of Birth/Sex: Treating RN: 01-02-1966 (57 y.o. Raymond [name] Primary Care Deshea Pooley: [name] Other Clinician: Massie Kluver Referring Kaci Dillie: Treating Gage Weant/Extender: [name] [place] ([id number]) [phone].pdf Page 10 of 10 Weeks in Treatment: 11 Vital Signs Time Taken: 10:59 Temperature (F): 98.3 Height (in): 74 Pulse (bpm): 72 Weight (lbs): 371 Respiratory Rate (breaths/min): 18 Body Mass Index (BMI): 47.6 Blood Pressure (mmHg): 130/60 Reference Range: 80 - 120 mg / dl Electronic Signature(s) Unsigned Entered By: Massie Kluver on  03/24/2022 11:28:43 Signature(s): Date(s):

## 2022-03-26 NOTE — Progress Notes (Signed)
RICHMOND, BOUNDS ([id number]) [id number] Page 1 of 2 Visit Report for 03/25/2022 HBO Details Patient Name: Date of Service: Raymond [name]. 03/25/2022 8:00 A M Medical Record Number: [id number] Patient Account Number: [id number] Date of Birth/Sex: Treating RN: 07/20/1965 (56 y.o. 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 HBO Treatment Details Treatment Number: 22 Patient Type: Outpatient Chamber Type: Monoplace Chamber Serial #: [id number] Treatment Protocol: 2.0 ATA with 90 minutes oxygen, and no air breaks Treatment Details Compression Rate Down: 1.5 psi / minute De-Compression Rate Up: 2.0 psi / minute Air breaks and breathing Decompress Decompress Compress Tx Pressure Begins Reached periods Begins Ends (leave unused spaces blank) Chamber Pressure (ATA 1 2 ------2 1 ) Clock Time (24 hr) 08:46 08:57 - - - - - - 10:28 10:36 Treatment Length: 110 (minutes) Treatment Segments: 4 Vital Signs Capillary Blood Glucose Reference Range: 80 - 120 mg / dl HBO Diabetic Blood Glucose Intervention Range: <131 mg/dl or >249 mg/dl Type: Time Vitals Blood Respiratory Capillary Blood Glucose Pulse Action Pulse: Temperature: Taken: Pressure: Rate: Glucose (mg/dl): Meter #: Oximetry (%) Taken: Pre 07:45 142/68 78 18 98.6 335 1 none per protocol Post 10:53 140/72 72 18 98.1 263 1 none per protocol Treatment Response Treatment Toleration: Well Treatment Completion Status: Treatment Completed without Adverse Event Electronic Signature(s) Signed: 03/25/2022 12:11:28 PM By: [name] RCP, RRT CHT , , Signed: 03/25/2022 1:45:46 PM By: [name] PA-C Entered By: [name] on 03/25/2022 11:47:37 , [name] ([id number]) [id number] Page 2 of 2 -------------------------------------------------------------------------------- HBO Safety Checklist Details Patient Name: Date of Service: Raymond [name]. 03/25/2022 8:00 A M Medical Record Number: [id number] Patient Account Number: [id number] Date of Birth/Sex: Treating RN: [birth year] (57 y.o. [name], [name] Primary Care Favour Aleshire: [name] Other Clinician: [name] Referring Deantae Shackleton: Treating Alfhild Partch/Extender: [name] Weeks in Treatment: 11 HBO Safety Checklist Items Safety Checklist Consent Form Signed Patient voided / foley secured and emptied When did you last eato 03/25/22 a m Last dose of injectable or oral agent 03/25/22 am metformin Ostomy pouch emptied and vented if applicable NA All implantable devices assessed, documented and approved NA Intravenous access site secured and place NA Valuables secured Linens and cotton and cotton/polyester blend (less than 51% polyester) Personal oil-based products / skin lotions / body lotions removed Wigs or hairpieces removed NA Smoking or tobacco materials removed NA Books / newspapers / magazines / loose paper removed NA Cologne, aftershave, perfume and deodorant removed Jewelry removed (may wrap wedding band) Make-up removed NA Hair care products removed Battery operated devices (external) removed NA Heating patches and chemical warmers removed NA Titanium eyewear removed NA Nail polish cured greater than 10 hours NA Casting material cured greater than 10 hours NA Hearing aids removed NA Loose dentures or partials removed NA Prosthetics have been removed NA Patient demonstrates correct use of air break device (if applicable) Patient concerns  have been addressed Patient grounding bracelet on and cord attached to chamber Specifics for Inpatients (complete in addition to above) Medication sheet sent with patient Intravenous medications needed or due during therapy sent with patient Drainage tubes (e.g. nasogastric tube or chest tube secured and  vented) Endotracheal or Tracheotomy tube secured Cuff deflated of air and inflated with saline Airway suctioned Electronic Signature(s) Signed: 03/25/2022 12:11:28 PM By: [name] RCP, RRT CHT , , Entered By: [name] on 03/25/2022 08:24:56 ,

## 2022-03-26 NOTE — Progress Notes (Signed)
MERGIM, ROTHWELL ([id number]) [id number] Page 1 of 2 Visit Report for 03/25/2022 Arrival Information Details Patient Name: Date of Service: Raymond [name]. 03/25/2022 8:00 A M Medical Record Number: [id number] Patient Account Number: [id number] Date of Birth/Sex: Treating RN: 1965/08/04 (57 y.o. [name], [name] Primary Care Norah Fick: [name] Other Clinician: [name] Referring Janilah Hojnacki: Treating Khaleed Holan/Extender: Hazle Coca in Treatment: 11 Visit Information History Since Last Visit Added or deleted any medications: No Patient Arrived: Ambulatory Any new allergies or adverse reactions: No Arrival Time: 07:40 Had a fall or experienced change in No Accompanied By: self activities of daily living that may affect Transfer Assistance: None risk of falls: Patient Identification Verified: Yes Signs or symptoms of abuse/neglect since No Secondary Verification Process Completed: Yes last visito Patient Requires Transmission-Based Precautions: No Hospitalized since last visit: No Patient Has Alerts: Yes Implantable device outside of the clinic No Patient Alerts: Type II Diabetic excluding Aspirin '81mg'$  cellular tissue based products placed in the ABI L [place]>220 center since last visit: Has Dressing in Place as Prescribed: Yes Has Footwear/Offloading in Place as Yes Prescribed: Left: Surgical Shoe with Pressure Relief Insole Pain Present Now: No Electronic Signature(s) Signed: 03/25/2022 12:11:28 PM By: [name] RCP, RRT CHT , , Entered By: [name] on 03/25/2022 08:05:33 , -------------------------------------------------------------------------------- Encounter Discharge Information Details Patient Name: Date of Service: Raymond [name], Raymond Negus D. 03/25/2022 8:00 A M Medical Record Number: [id number] Patient Account Number: [id number] Date of Birth/Sex: Treating RN: [birth year]  (57 y.o. [name], [name] Primary Care Shaine Mount: [name] Other Clinician: [name] Referring Hagan Vanauken: Treating Fidencio Duddy/Extender: Hazle Coca in Treatment: 11 Encounter Discharge Information Items Discharge Condition: Stable Ambulatory Status: Ambulatory Discharge Destination: Home KEYON, SADA ([id number]) [phone].pdf Page 2 of 2 Transportation: Other Accompanied By: transportation Schedule Follow-up Appointment: Yes Clinical Summary of Care: Notes Patient has an HBO treatment scheduled on 03/28/22 at 08:00 am. Electronic Signature(s) Signed: 03/25/2022 12:11:28 PM By: [name] RCP, RRT CHT , , Entered By: [name] on 03/25/2022 11:49:28 , -------------------------------------------------------------------------------- Vitals Details Patient Name: Date of Service: Raymond [name], Raymond Negus D. 03/25/2022 8:00 A M Medical Record Number: [id number] Patient Account Number: [id number] Date of Birth/Sex: Treating RN: [birth year] (57 y.o. [name], [name] Primary Care Shine Mikes: [name] Other Clinician: [name] Referring Javione Gunawan: Treating Cristen Murcia/Extender: [name] Weeks in Treatment: 11 Vital Signs Time Taken: 07:45 Temperature (F): 98.6 Height (in): 74 Pulse (bpm): 78 Weight (lbs): 371 Respiratory Rate (breaths/min): 18 Body Mass Index (BMI): 47.6 Blood Pressure (mmHg): 142/68 Capillary Blood Glucose (mg/dl): 335 Reference Range: 80 - 120 mg / dl Electronic Signature(s) Signed: 03/25/2022 12:11:28 PM By: [name] RCP, RRT CHT , , Entered By: [name] on 03/25/2022 08:23:40 ,

## 2022-03-28 ENCOUNTER — Encounter: Payer: Medicare HMO | Admitting: Physician Assistant

## 2022-03-28 DIAGNOSIS — E11621 Type 2 diabetes mellitus with foot ulcer: Secondary | ICD-10-CM | POA: Diagnosis not present

## 2022-03-28 LAB — GLUCOSE, CAPILLARY
Glucose-Capillary: 337 mg/dL — ABNORMAL HIGH (ref 70–99)
Glucose-Capillary: 300 mg/dL — ABNORMAL HIGH (ref 70–99)

## 2022-03-28 NOTE — Progress Notes (Signed)
MAASAI, TURTURRO ([id number]) [id number] Page 1 of 2 Visit Report for 03/28/2022 Arrival Information Details Patient Name: Date of Service: Raymond [name]. 03/28/2022 8:00 A M Medical Record Number: [id number] Patient Account Number: [id number] Date of Birth/Sex: Treating RN: 1965-02-11 (57 y.o. [name], [name] Primary Care Danelle Curiale: [name] Other Clinician: [name] Referring Brayden Brodhead: Treating Jasiyah Poland/Extender: Hazle Coca in Treatment: 12 Visit Information History Since Last Visit Added or deleted any medications: No Patient Arrived: Ambulatory Any new allergies or adverse reactions: No Arrival Time: 07:45 Had a fall or experienced change in No Accompanied By: self activities of daily living that may affect Transfer Assistance: None risk of falls: Patient Identification Verified: Yes Signs or symptoms of abuse/neglect since No Secondary Verification Process Completed: Yes last visito Patient Requires Transmission-Based Precautions: No Hospitalized since last visit: No Patient Has Alerts: Yes Implantable device outside of the clinic No Patient Alerts: Type II Diabetic excluding Aspirin '81mg'$  cellular tissue based products placed in the ABI L [place]>220 center since last visit: Has Dressing in Place as Prescribed: Yes Has Footwear/Offloading in Place as Yes Prescribed: Left: Surgical Shoe with Pressure Relief Insole Pain Present Now: No Electronic Signature(s) Signed: 03/28/2022 12:02:52 PM By: [name] RCP, RRT CHT , , Entered By: [name] on 03/28/2022 08:31:26 , -------------------------------------------------------------------------------- Encounter Discharge Information Details Patient Name: Date of Service: Raymond [name], Raymond Negus D. 03/28/2022 8:00 A M Medical Record Number: [id number] Patient Account Number: [id number] Date of Birth/Sex: Treating RN: 23-Oct-1965  (57 y.o. [name], [name] Primary Care Kert Shackett: [name] Other Clinician: [name] Referring Demeisha Geraghty: Treating Aneya Daddona/Extender: Hazle Coca in Treatment: 12 Encounter Discharge Information Items Discharge Condition: Stable Ambulatory Status: Ambulatory Discharge Destination: Home JUMAL, HUSSAIN ([id number]) [phone].pdf Page 2 of 2 Transportation: Other Accompanied By: transportation Schedule Follow-up Appointment: Yes Clinical Summary of Care: Notes Patient has an HBO treatment scheduled on 03/29/22 at 08:00 am. Electronic Signature(s) Signed: 03/28/2022 12:02:52 PM By: [name] RCP, RRT CHT , , Entered By: [name] on 03/28/2022 11:58:55 , -------------------------------------------------------------------------------- Vitals Details Patient Name: Date of Service: Raymond [name], Raymond Negus D. 03/28/2022 8:00 A M Medical Record Number: [id number] Patient Account Number: [id number] Date of Birth/Sex: Treating RN: 1965-12-08 (57 y.o. [name], [name] Primary Care Teale Goodgame: [name] Other Clinician: [name] Referring Miaya Lafontant: Treating Lonisha Bobby/Extender: [name] Weeks in Treatment: 12 Vital Signs Time Taken: 07:52 Temperature (F): 98.5 Height (in): 74 Pulse (bpm): 72 Weight (lbs): 371 Respiratory Rate (breaths/min): 18 Body Mass Index (BMI): 47.6 Blood Pressure (mmHg): 142/62 Capillary Blood Glucose (mg/dl): 337 Reference Range: 80 - 120 mg / dl Electronic Signature(s) Signed: 03/28/2022 12:02:52 PM By: [name] RCP, RRT CHT , , Entered By: [name] on 03/28/2022 08:32:05 ,

## 2022-03-29 ENCOUNTER — Encounter: Payer: Medicare HMO | Admitting: Physician Assistant

## 2022-03-29 DIAGNOSIS — E11621 Type 2 diabetes mellitus with foot ulcer: Secondary | ICD-10-CM | POA: Diagnosis not present

## 2022-03-29 LAB — GLUCOSE, CAPILLARY
Glucose-Capillary: 284 mg/dL — ABNORMAL HIGH (ref 70–99)
Glucose-Capillary: 244 mg/dL — ABNORMAL HIGH (ref 70–99)

## 2022-03-29 NOTE — Progress Notes (Signed)
COSTON, SHIVE ([id number]) [id number] Page 1 of 2 Visit Report for 03/28/2022 HBO Details Patient Name: Date of Service: Raymond [name]. 03/28/2022 8:00 A M Medical Record Number: [id number] Patient Account Number: [id number] Date of Birth/Sex: Treating RN: 1965-08-27 (57 y.o. 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 HBO Treatment Details Treatment Number: 23 Patient Type: Outpatient Chamber Type: Monoplace Chamber Serial #: [id number] Treatment Protocol: 2.0 ATA with 90 minutes oxygen, and no air breaks Treatment Details Compression Rate Down: 1.5 psi / minute De-Compression Rate Up: 2.0 psi / minute Air breaks and breathing Decompress Decompress Compress Tx Pressure Begins Reached periods Begins Ends (leave unused spaces blank) Chamber Pressure (ATA 1 2 ------2 1 ) Clock Time (24 hr) 08:11 08:23 - - - - - - 09:53 10:02 Treatment Length: 111 (minutes) Treatment Segments: 4 Vital Signs Capillary Blood Glucose Reference Range: 80 - 120 mg / dl HBO Diabetic Blood Glucose Intervention Range: <131 mg/dl or >249 mg/dl Type: Time Vitals Blood Respiratory Capillary Blood Glucose Pulse Action Pulse: Temperature: Taken: Pressure: Rate: Glucose (mg/dl): Meter #: Oximetry (%) Taken: Pre 07:52 142/62 72 18 98.5 337 1 none per protocol Post 10:20 142/70 72 18 98.5 300 1 none per protocol Treatment Response Treatment Toleration: Well Treatment Completion Status: Treatment Completed without Adverse Event Electronic Signature(s) Signed: 03/28/2022 12:02:52 PM By: [name] RCP, RRT CHT , , Signed: 03/28/2022 5:34:14 PM By: [name] PA-C Entered By: [name] on 03/28/2022 11:56:00 , [name] ([id number]) [id number] Page 2 of 2 -------------------------------------------------------------------------------- HBO Safety Checklist Details Patient Name: Date of Service: Raymond [name]. 03/28/2022 8:00 A M Medical Record Number: [id number] Patient Account Number: [id number] Date of Birth/Sex: Treating RN: 1965-05-17 (57 y.o. [name], [name] Primary Care Kennie Snedden: [name] Other Clinician: [name] Referring Gavino Fouch: Treating Skyelynn Rambeau/Extender: [name] Weeks in Treatment: 12 HBO Safety Checklist Items Safety Checklist Consent Form Signed Patient voided / foley secured and emptied When did you last eato 03/28/22 am Last dose of injectable or oral agent 03/28/22 am Ostomy pouch emptied and vented if applicable NA All implantable devices assessed, documented and approved NA Intravenous access site secured and place NA Valuables secured Linens and cotton and cotton/polyester blend (less than 51% polyester) Personal oil-based products / skin lotions / body lotions removed Wigs or hairpieces removed NA Smoking or tobacco materials removed NA Books / newspapers / magazines / loose paper removed NA Cologne, aftershave, perfume and deodorant removed Jewelry removed (may wrap wedding band) Make-up removed NA Hair care products removed Battery operated devices (external) removed NA Heating patches and chemical warmers removed NA Titanium eyewear removed NA Nail polish cured greater than 10 hours NA Casting material cured greater than 10 hours NA Hearing aids removed NA Loose dentures or partials removed NA Prosthetics have been removed NA Patient demonstrates correct use of air break device (if applicable) Patient concerns have been  addressed Patient grounding bracelet on and cord attached to chamber Specifics for Inpatients (complete in addition to above) Medication sheet sent with patient Intravenous medications needed or due during therapy sent with patient Drainage tubes (e.g. nasogastric tube or chest tube secured and vented) Endotracheal  or Tracheotomy tube secured Cuff deflated of air and inflated with saline Airway suctioned Electronic Signature(s) Signed: 03/28/2022 12:02:52 PM By: [name] RCP, RRT CHT , , Entered By: [name] on 03/28/2022 08:34:08 ,

## 2022-03-30 ENCOUNTER — Encounter (HOSPITAL_BASED_OUTPATIENT_CLINIC_OR_DEPARTMENT_OTHER): Payer: Medicare HMO | Admitting: Internal Medicine

## 2022-03-30 DIAGNOSIS — L97522 Non-pressure chronic ulcer of other part of left foot with fat layer exposed: Secondary | ICD-10-CM

## 2022-03-30 DIAGNOSIS — E11621 Type 2 diabetes mellitus with foot ulcer: Secondary | ICD-10-CM

## 2022-03-30 DIAGNOSIS — M86372 Chronic multifocal osteomyelitis, left ankle and foot: Secondary | ICD-10-CM

## 2022-03-30 LAB — GLUCOSE, CAPILLARY
Glucose-Capillary: 221 mg/dL — ABNORMAL HIGH (ref 70–99)
Glucose-Capillary: 184 mg/dL — ABNORMAL HIGH (ref 70–99)

## 2022-03-30 NOTE — Progress Notes (Signed)
JAYLENN, GEORGESON ([id number]) [id number] Page 1 of 2 Visit Report for 03/29/2022 Arrival Information Details Patient Name: Date of Service: Raymond [name]. 03/29/2022 8:00 A M Medical Record Number: [id number] Patient Account Number: [id number] Date of Birth/Sex: Treating RN: Dec 16, 1965 (57 y.o. [name], [name] Primary Care Diantha Paxson: [name] Other Clinician: [name] Referring Janeene Sand: Treating Kaesyn Johnston/Extender: Hazle Coca in Treatment: 12 Visit Information History Since Last Visit Added or deleted any medications: No Patient Arrived: Ambulatory Any new allergies or adverse reactions: No Arrival Time: 07:40 Had a fall or experienced change in No Accompanied By: self activities of daily living that may affect Transfer Assistance: None risk of falls: Secondary Verification Process Completed: Yes Signs or symptoms of abuse/neglect since No Patient Requires Transmission-Based Precautions: No last visito Patient Has Alerts: Yes Hospitalized since last visit: No Patient Alerts: Type II Diabetic Implantable device outside of the clinic No Aspirin '81mg'$  excluding ABI L [place]>220 cellular tissue based products placed in the center since last visit: Has Dressing in Place as Prescribed: Yes Has Footwear/Offloading in Place as Yes Prescribed: Left: Surgical Shoe with Pressure Relief Insole Pain Present Now: No Electronic Signature(s) Signed: 03/29/2022 11:56:10 AM By: [name] RCP, RRT CHT , , Entered By: [name] on 03/29/2022 08:23:31 , -------------------------------------------------------------------------------- Encounter Discharge Information Details Patient Name: Date of Service: Raymond [name], Raymond Negus D. 03/29/2022 8:00 A M Medical Record Number: [id number] Patient Account Number: [id number] Date of Birth/Sex: Treating RN: 26-Aug-1965 (57 y.o. [name], [name] Primary Care  D'Arcy Abraha: [name] Other Clinician: [name] Referring Jatasia Gundrum: Treating Gianelle Mccaul/Extender: Hazle Coca in Treatment: 12 Encounter Discharge Information Items Discharge Condition: Stable Ambulatory Status: Ambulatory Discharge Destination: Home Raymond [name], Raymond [name] ([id number]) [id number].pdf Page 2 of 2 Transportation: Other Accompanied By: transportation Schedule Follow-up Appointment: Yes Clinical Summary of Care: Notes Patient has an HBO treatment scheduled on 03/30/22 at 08:00 am. Electronic Signature(s) Signed: 03/29/2022 11:56:10 AM By: [name] RCP, RRT CHT , , Entered By: [name] on 03/29/2022 11:53:50 , -------------------------------------------------------------------------------- Vitals Details Patient Name: Date of Service: Raymond [name], Raymond Negus D. 03/29/2022 8:00 A M Medical Record Number: [id number] Patient Account Number: [id number] Date of Birth/Sex: Treating RN: 01-17-1966 (57 y.o. [name], [name] Primary Care Jolyn Deshmukh: [name] Other Clinician: [name] Referring Darlyne Schmiesing: Treating Reta Norgren/Extender: [name] Weeks in Treatment: 12 Vital Signs Time Taken: 07:44 Temperature (F): 97.8 Height (in): 74 Pulse (bpm): 72 Weight (lbs): 371 Respiratory Rate (breaths/min): 18 Body Mass Index (BMI): 47.6 Blood Pressure (mmHg): 140/60 Capillary Blood Glucose (mg/dl): 284 Reference Range: 80 - 120 mg / dl Electronic Signature(s) Signed: 03/29/2022 11:56:10 AM By: [name] RCP, RRT CHT , , Entered By: [name] on 03/29/2022 08:24:22 ,

## 2022-03-31 ENCOUNTER — Encounter: Payer: Medicare HMO | Admitting: Physician Assistant

## 2022-03-31 DIAGNOSIS — E11621 Type 2 diabetes mellitus with foot ulcer: Secondary | ICD-10-CM | POA: Diagnosis not present

## 2022-03-31 LAB — GLUCOSE, CAPILLARY
Glucose-Capillary: 200 mg/dL — ABNORMAL HIGH (ref 70–99)
Glucose-Capillary: 192 mg/dL — ABNORMAL HIGH (ref 70–99)

## 2022-03-31 NOTE — Progress Notes (Signed)
NATAS, MUSCARI ([id number]) [id number] Page 1 of 2 Visit Report for 03/30/2022 Arrival Information Details Patient Name: Date of Service: Raymond [name]. 03/30/2022 8:00 A M Medical Record Number: [id number] Patient Account Number: [id number] Date of Birth/Sex: Treating RN: [birth year] (57 y.o. Raymond [name], Raymond [name] Primary Care Raymond [name]: Raymond [name] Other Clinician: Jacqulyn [name] Referring Raymond [name]: Treating Raymond [name]/Extender: Raymond [name] in Treatment: 12 Visit Information History Since Last Visit Added or deleted any medications: No Patient Arrived: Ambulatory Any new allergies or adverse reactions: No Arrival Time: 07:45 Had a fall or experienced change in No Accompanied By: self activities of daily living that may affect Transfer Assistance: None risk of falls: Patient Identification Verified: Yes Signs or symptoms of abuse/neglect since No Secondary Verification Process Completed: Yes last visito Patient Requires Transmission-Based Precautions: No Hospitalized since last visit: No Patient Has Alerts: Yes Implantable device outside of the clinic No Patient Alerts: Type II Diabetic excluding Aspirin '81mg'$  cellular tissue based products placed in the ABI L [place]>220 center since last visit: Has Dressing in Place as Prescribed: Yes Has Footwear/Offloading in Place as Yes Prescribed: Left: Surgical Shoe with Pressure Relief Insole Pain Present Now: No Electronic Signature(s) Signed: 03/30/2022 12:41:54 PM By: [name] RCP, RRT CHT , , Entered By: [name] on 03/30/2022 08:48:38 , -------------------------------------------------------------------------------- Encounter Discharge Information Details Patient Name: Date of Service: Raymond [name], Raymond Negus D. 03/30/2022 8:00 A M Medical Record Number: [id number] Patient Account Number: [id number] Date of Birth/Sex: Treating  RN: 29-Apr-1965 (56 y.o. Raymond [name], Raymond [name] Primary Care Raymond [name]: Raymond [name] Other Clinician: Jacqulyn [name] Referring Raymond [name]: Treating Raymond [name]/Extender: Raymond [name] in Treatment: 12 Encounter Discharge Information Items Discharge Condition: Stable Ambulatory Status: Ambulatory Discharge Destination: Home Raymond [name], Raymond [name] ([id number]) [phone].pdf Page 2 of 2 Transportation: Other Accompanied By: transportation Schedule Follow-up Appointment: Yes Clinical Summary of Care: Notes Patient has an HBO treatment scheduled on 03/31/22 at 08:00 am. Electronic Signature(s) Signed: 03/30/2022 12:41:54 PM By: [name] RCP, RRT CHT , , Entered By: [name] on 03/30/2022 12:20:41 , -------------------------------------------------------------------------------- Vitals Details Patient Name: Date of Service: Raymond [name], Raymond Negus D. 03/30/2022 8:00 A M Medical Record Number: [id number] Patient Account Number: [id number] Date of Birth/Sex: Treating RN: Jul 29, 1965 (57 y.o. Raymond [name], Raymond [name] Primary Care Raymond [name]: Raymond [name] Other Clinician: Jacqulyn [name] Referring Raymond [name]: Treating Raymond [name]/Extender: Raymond [name] in Treatment: 12 Vital Signs Time Taken: 07:54 Temperature (F): 98.2 Height (in): 74 Pulse (bpm): 66 Weight (lbs): 371 Respiratory Rate (breaths/min): 18 Body Mass Index (BMI): 47.6 Blood Pressure (mmHg): 140/70 Capillary Blood Glucose (mg/dl): 221 Reference Range: 80 - 120 mg / dl Electronic Signature(s) Signed: 03/30/2022 12:41:54 PM By: [name] RCP, RRT CHT , , Entered By: [name] on 03/30/2022 08:51:10 ,

## 2022-03-31 NOTE — Progress Notes (Signed)
PARMOD, LEGORE ([id number]) [id number] Page 1 of 2 Visit Report for 03/30/2022 HBO Details Patient Name: Date of Service: Raymond [name]. 03/30/2022 8:00 A M Medical Record Number: [id number] Patient Account Number: [id number] Date of Birth/Sex: Treating RN: [birth year] (57 y.o. 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 HBO Treatment Details Treatment Number: 25 Patient Type: Outpatient Chamber Type: Monoplace Chamber Serial #: [id number] Treatment Protocol: 2.0 ATA with 90 minutes oxygen, and no air breaks Treatment Details Compression Rate Down: 1.5 psi / minute De-Compression Rate Up: 2.0 psi / minute Air breaks and breathing Decompress Decompress Compress Tx Pressure Begins Reached periods Begins Ends (leave unused spaces blank) Chamber Pressure (ATA 1 2 ------2 1 ) Clock Time (24 hr) 08:23 08:35 - - - - - - 10:05 10:15 Treatment Length: 112 (minutes) Treatment Segments: 4 Vital Signs Capillary Blood Glucose Reference Range: 80 - 120 mg / dl HBO Diabetic Blood Glucose Intervention Range: <131 mg/dl or >249 mg/dl Type: Time Vitals Blood Respiratory Capillary Blood Glucose Pulse Action Pulse: Temperature: Taken: Pressure: Rate: Glucose (mg/dl): Meter #: Oximetry (%) Taken: Pre 07:54 140/70 66 18 98.2 221 1 none per protocol Post 10:33 184 60 18 98.5 184 1 none per protocol Treatment Response Treatment Toleration: Well Treatment Completion Status: Treatment Completed without Adverse Event HBO Attestation I certify that I supervised this HBO treatment in  accordance with Medicare guidelines. A trained emergency response team is readily available per Yes hospital policies and procedures. Continue HBOT as ordered. Yes Electronic Signature(s) Signed: 03/30/2022 1:53:20 PM By: [name] DO Previous Signature: 03/30/2022 12:41:54 PM Version By: [name] RCP, RRT CHT , , Entered By: [name] on 03/30/2022 13:51:56 [name] ([id number] Page 2 of 2 -------------------------------------------------------------------------------- HBO Safety Checklist Details Patient Name: Date of Service: Raymond [name]. 03/30/2022 8:00 A M Medical Record Number: [id number] Patient Account Number: [id number] Date of Birth/Sex: Treating RN: 10-13-1965 (57 y.o. [name], [name] Primary Care [name]: [name] Other Clinician: [name] Referring [name]: Treating [name]/Extender: [name] in Treatment: 12 HBO Safety Checklist Items Safety Checklist Consent Form Signed Patient voided / foley secured and emptied When did you last eato 03/30/22 am Last dose of injectable or oral agent 03/30/22 am metformin Ostomy pouch emptied and vented if applicable NA All implantable devices assessed, documented and approved NA Intravenous access site secured and place NA Valuables secured Linens and cotton and cotton/polyester blend (less than 51% polyester) Personal oil-based products / skin lotions / body lotions removed Wigs or hairpieces removed NA Smoking or tobacco materials removed NA Books / newspapers / magazines / loose paper removed NA Cologne, aftershave, perfume and deodorant removed Jewelry removed (may wrap wedding band) Make-up removed NA Hair care products removed Battery operated devices (external) removed NA Heating patches and chemical warmers removed NA Titanium eyewear removed NA Nail polish cured greater than 10  hours NA Casting material cured greater than 10 hours NA Hearing aids removed NA Loose dentures or partials removed NA Prosthetics have been removed NA Patient demonstrates correct use of air break device (if applicable) Patient concerns have been addressed Patient grounding bracelet on and cord attached to chamber Specifics  for Inpatients (complete in addition to above) Medication sheet sent with patient Intravenous medications needed or due during therapy sent with patient Drainage tubes (e.g. nasogastric tube or chest tube secured and vented) Endotracheal or Tracheotomy tube secured Cuff deflated of air and inflated with saline Airway suctioned Electronic Signature(s) Signed: 03/30/2022 12:41:54 PM By: [name] RCP, RRT CHT , , Entered By: [name] on 03/30/2022 12:18:23 ,

## 2022-04-01 ENCOUNTER — Encounter: Payer: Medicare HMO | Attending: Physician Assistant | Admitting: Physician Assistant

## 2022-04-01 DIAGNOSIS — I5042 Chronic combined systolic (congestive) and diastolic (congestive) heart failure: Secondary | ICD-10-CM | POA: Insufficient documentation

## 2022-04-01 DIAGNOSIS — M86372 Chronic multifocal osteomyelitis, left ankle and foot: Secondary | ICD-10-CM | POA: Diagnosis present

## 2022-04-01 DIAGNOSIS — E11621 Type 2 diabetes mellitus with foot ulcer: Secondary | ICD-10-CM | POA: Insufficient documentation

## 2022-04-01 DIAGNOSIS — J449 Chronic obstructive pulmonary disease, unspecified: Secondary | ICD-10-CM | POA: Diagnosis not present

## 2022-04-01 DIAGNOSIS — L97522 Non-pressure chronic ulcer of other part of left foot with fat layer exposed: Secondary | ICD-10-CM | POA: Diagnosis not present

## 2022-04-01 LAB — GLUCOSE, CAPILLARY: Glucose-Capillary: 159 mg/dL — ABNORMAL HIGH (ref 70–99)

## 2022-04-03 NOTE — Progress Notes (Signed)
MANMEET, MCQUAIDE ([id number]) [id number] Page 1 of 2 Visit Report for 03/31/2022 HBO Details Patient Name: Date of Service: Raymond [name]. 03/31/2022 8:00 A M Medical Record Number: [id number] Patient Account Number: [id number] Date of Birth/Sex: Treating RN: 06-06-1965 (57 y.o. 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 HBO Treatment Details Treatment Number: 26 Patient Type: Outpatient Chamber Type: Monoplace Chamber Serial #: [id number] Treatment Protocol: 2.0 ATA with 90 minutes oxygen, and no air breaks Treatment Details Compression Rate Down: 1.5 psi / minute De-Compression Rate Up: 2.0 psi / minute Air breaks and breathing Decompress Decompress Compress Tx Pressure Begins Reached periods Begins Ends (leave unused spaces blank) Chamber Pressure (ATA 1 2 ------2 1 ) Clock Time (24 hr) 08:02 08:14 - - - - - - 09:45 09:53 Treatment Length: 111 (minutes) Treatment Segments: 4 Vital Signs Capillary Blood Glucose Reference Range: 80 - 120 mg / dl HBO Diabetic Blood Glucose Intervention Range: <131 mg/dl or >249 mg/dl Type: Time Vitals Blood Respiratory Capillary Blood Glucose Pulse Action Pulse: Temperature: Taken: Pressure: Rate: Glucose (mg/dl): Meter #: Oximetry (%) Taken: Pre 07:48 142/64 84 18 98.5 200 1 none per protocol Post 10:16 132/60 72 18 98.5 192 1 none per protocol Treatment Response Treatment Toleration: Well Treatment Completion Status: Treatment Completed without Adverse Event Electronic Signature(s) Signed: 03/31/2022 1:30:01 PM By: [name] RCP, RRT CHT , , Signed: 03/31/2022 5:15:32 PM By: [name] PA-C Entered By: [name] on 03/31/2022 13:28:49 , [name] ([id number]) [id number] Page 2 of 2 -------------------------------------------------------------------------------- HBO Safety Checklist Details Patient Name: Date of Service: Raymond [name]. 03/31/2022 8:00 A M Medical Record Number: [id number] Patient Account Number: [id number] Date of Birth/Sex: Treating RN: 12/02/1965 (57 y.o. [name], [name] Primary Care Orlondo Holycross: [name] Other Clinician: [name] Referring Avilyn Virtue: Treating Krystyn Picking/Extender: [name] Weeks in Treatment: 12 HBO Safety Checklist Items Safety Checklist Consent Form Signed Patient voided / foley secured and emptied When did you last eato 03/31/22 am Last dose of injectable or oral agent 03/31/22 am metformin Ostomy pouch emptied and vented if applicable NA All implantable devices assessed, documented and approved NA Intravenous access site secured and place NA Valuables secured Linens and cotton and cotton/polyester blend (less than 51% polyester) Personal oil-based products / skin lotions / body lotions removed Wigs or hairpieces removed NA Smoking or tobacco materials removed NA Books / newspapers / magazines / loose paper removed NA Cologne, aftershave, perfume and deodorant removed Jewelry removed (may wrap wedding band) Make-up removed NA Hair care products removed Battery operated devices (external) removed NA Heating patches and chemical warmers removed NA Titanium eyewear removed NA Nail polish cured greater than 10 hours NA Casting material cured greater than 10 hours NA Hearing aids removed NA Loose dentures or partials removed NA Prosthetics have been removed NA Patient demonstrates correct use of air break device (if applicable) Patient concerns have been  addressed Patient grounding bracelet on and cord attached to chamber Specifics for Inpatients (complete in addition to above) Medication sheet sent with patient Intravenous medications needed or due during therapy sent with patient Drainage tubes (e.g. nasogastric tube or chest tube secured and vented)  Endotracheal or Tracheotomy tube secured Cuff deflated of air and inflated with saline Airway suctioned Electronic Signature(s) Signed: 03/31/2022 1:30:01 PM By: [name] RCP, RRT CHT , , Entered By: [name] on 03/31/2022 08:25:22 ,

## 2022-04-03 NOTE — Progress Notes (Signed)
Raymond, [name] ([id number]) [id number] Page 1 of 2 Visit Report for 03/29/2022 HBO Details Patient Name: Date of Service: Raymond [name]. 03/29/2022 8:00 A M Medical Record Number: [id number] Patient Account Number: [id number] Date of Birth/Sex: Treating RN: [birth year] (57 y.o. 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 HBO Treatment Details Treatment Number: 24 Patient Type: Outpatient Chamber Type: Monoplace Chamber Serial #: [id number] Treatment Protocol: 2.0 ATA with 90 minutes oxygen, and no air breaks Treatment Details Compression Rate Down: 1.5 psi / minute De-Compression Rate Up: 2.0 psi / minute Air breaks and breathing Decompress Decompress Compress Tx Pressure Begins Reached periods Begins Ends (leave unused spaces blank) Chamber Pressure (ATA 1 2 ------2 1 ) Clock Time (24 hr) 08:01 08:13 - - - - - - 09:44 09:51 Treatment Length: 110 (minutes) Treatment Segments: 4 Vital Signs Capillary Blood Glucose Reference Range: 80 - 120 mg / dl HBO Diabetic Blood Glucose Intervention Range: <131 mg/dl or >249 mg/dl Type: Time Vitals Blood Respiratory Capillary Blood Glucose Pulse Action Pulse: Temperature: Taken: Pressure: Rate: Glucose (mg/dl): Meter #: Oximetry (%) Taken: Pre 07:44 140/60 72 18 97.8 284 1 none per protocol Post 10:08 142/70 84 18 98.4 244 1 none per protocol Treatment Response Treatment Toleration: Well Treatment Completion Status: Treatment Completed without Adverse Event Electronic Signature(s) Signed: 03/29/2022 11:56:10 AM By: [name] RCP, RRT CHT , , Signed: 03/31/2022 5:15:32 PM By: [name] PA-C Entered By: [name] on 03/29/2022 11:52:57 , [name] ([id number]) [id number] Page 2 of 2 -------------------------------------------------------------------------------- HBO Safety Checklist Details Patient Name: Date of Service: Raymond [name]. 03/29/2022 8:00 A M Medical Record Number: [id number] Patient Account Number: [id number] Date of Birth/Sex: Treating RN: Nov 14, 1965 (57 y.o. Raymond [name], Raymond [name] Primary Care Jaeceon Michelin: [name] Other Clinician: [name] Referring Naela Nodal: Treating Harla Mensch/Extender: [name] Weeks in Treatment: 12 HBO Safety Checklist Items Safety Checklist Consent Form Signed Patient voided / foley secured and emptied When did you last eato 03/29/22 am Last dose of injectable or oral agent 03/29/22 am metformin Ostomy pouch emptied and vented if applicable NA All implantable devices assessed, documented and approved NA Intravenous access site secured and place NA Valuables secured Linens and cotton and cotton/polyester blend (less than 51% polyester) Personal oil-based products / skin lotions / body lotions removed Wigs or hairpieces removed NA Smoking or tobacco materials removed NA Books / newspapers / magazines / loose paper removed NA Cologne, aftershave, perfume and deodorant removed Jewelry removed (may wrap wedding band) Make-up removed NA Hair care products removed Battery operated devices (external) removed NA Heating patches and chemical warmers removed NA Titanium eyewear removed NA Nail polish cured greater than 10 hours NA Casting material cured greater than 10 hours NA Hearing aids removed NA Loose dentures or partials removed NA Prosthetics have been removed NA Patient demonstrates correct use of air break device (if applicable) Patient concerns  have been addressed Patient grounding bracelet on and cord attached to chamber Specifics for Inpatients (complete in addition to above) Medication sheet sent with patient Intravenous medications needed or due during therapy sent with patient Drainage tubes (e.g. nasogastric tube or chest tube secured and vented)  Endotracheal or Tracheotomy tube secured Cuff deflated of air and inflated with saline Airway suctioned Electronic Signature(s) Signed: 03/29/2022 11:56:10 AM By: [name] RCP, RRT CHT , , Entered By: [name] on 03/29/2022 08:25:49 ,

## 2022-04-04 ENCOUNTER — Encounter: Payer: Medicare HMO | Admitting: Internal Medicine

## 2022-04-04 DIAGNOSIS — M86372 Chronic multifocal osteomyelitis, left ankle and foot: Secondary | ICD-10-CM | POA: Diagnosis not present

## 2022-04-04 LAB — GLUCOSE, CAPILLARY
Glucose-Capillary: 259 mg/dL — ABNORMAL HIGH (ref 70–99)
Glucose-Capillary: 252 mg/dL — ABNORMAL HIGH (ref 70–99)

## 2022-04-04 NOTE — Progress Notes (Signed)
Raymond [name] ([id number]) [id number] Page 1 of 2 Visit Report for 04/01/2022 Arrival Information Details Patient Name: Date of Service: Raymond [name] 04/01/2022 8:00 A M Medical Record Number: [id number] Patient Account Number: [id number] Date of Birth/Sex: Treating RN: [birth year] (56 y.o. Raymond [name]) Carlene Coria Primary Care Roverto Bodmer: [name] Other Clinician: [name] Referring Yailene Badia: Treating Bentlee Benning[place]/Extender: Hazle Coca in Treatment: 12 Visit Information History Since Last Visit Added or deleted any medications: No Patient Arrived: Ambulatory Any new allergies or adverse reactions: No Arrival Time: 07:40 Had a fall or experienced change in No Accompanied By: self activities of daily living that may affect Transfer Assistance: None risk of falls: Patient Identification Verified: Yes Signs or symptoms of abuse/neglect since last visito No Secondary Verification Process Completed: Yes Hospitalized since last visit: No Patient Requires Transmission-Based Precautions: No Pain Present Now: No Patient Has Alerts: Yes Patient Alerts: Type II Diabetic Aspirin '81mg'$  ABI L [place]>220 Electronic Signature(s) Signed: 04/01/2022 1:10:02 PM By: [name] RCP, RRT CHT , , Entered By: [name] on 04/01/2022 08:40:51 , -------------------------------------------------------------------------------- Encounter Discharge Information Details Patient Name: Date of Service: Raymond Eke D. 04/01/2022 8:00 A M Medical Record Number: [id number] Patient Account Number: [id number] Date of Birth/Sex: Treating RN: May 17, 1965 (56 y.o. Raymond [name]) Carlene Coria Primary Care Gargi Berch: [name] Other Clinician: [name] Referring Wilmarie Sparlin: Treating Giannie Soliday/Extender: Hazle Coca in Treatment: 12 Encounter Discharge Information Items Discharge Condition:  Stable Ambulatory Status: Ambulatory Discharge Destination: Home Transportation: Other Accompanied By: transportation Schedule Follow-up Appointment: Yes Clinical Summary of Care: Raymond [name] ([id number]) [phone].pdf Page 2 of 2 Notes Patient has an HBO treatment scheduled on 04/04/22 at 08:00 am. Electronic Signature(s) Signed: 04/01/2022 1:10:02 PM By: [name] RCP, RRT CHT , , Entered By: [name] on 04/01/2022 11:25:42 , -------------------------------------------------------------------------------- Vitals Details Patient Name: Date of Service: Raymond [name], Raymond Negus D. 04/01/2022 8:00 A M Medical Record Number: [id number] Patient Account Number: [id number] Date of Birth/Sex: Treating RN: 1966-01-22 (56 y.o. Raymond [name]) Carlene Coria Primary Care Manjot Beumer: [name] Other Clinician: [name] Referring Ashante Yellin: Treating Thoren Hosang/Extender: Hazle Coca in Treatment: 12 Vital Signs Time Taken: 07:42 Temperature (F): 97.8 Height (in): 74 Pulse (bpm): 60 Weight (lbs): 371 Respiratory Rate (breaths/min): 18 Body Mass Index (BMI): 47.6 Blood Pressure (mmHg): 140/78 Capillary Blood Glucose (mg/dl): 159 Reference Range: 80 - 120 mg / dl Electronic Signature(s) Signed: 04/01/2022 1:10:02 PM By: [name] RCP, RRT CHT , , Entered By: [name] on 04/01/2022 08:41:07 ,

## 2022-04-04 NOTE — Progress Notes (Signed)
ZED, KANALEY ([id number]) [id number] Page 1 of 2 Visit Report for 04/01/2022 HBO Details Patient Name: Date of Service: Raymond [name]. 04/01/2022 8:00 A M Medical Record Number: [id number] Patient Account Number: [id number] Date of Birth/Sex: Treating RN: 1965-06-16 (57 y.o. 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 HBO Treatment Details Treatment Number: 27 Patient Type: Outpatient Chamber Type: Monoplace Chamber Serial #: [id number] Treatment Protocol: 2.0 ATA with 90 minutes oxygen, and no air breaks Treatment Details Compression Rate Down: 1.5 psi / minute De-Compression Rate Up: 2.0 psi / minute Air breaks and breathing Decompress Decompress Compress Tx Pressure Begins Reached periods Begins Ends (leave unused spaces blank) Chamber Pressure (ATA 1 2 ------2 1 ) Clock Time (24 hr) 07:57 08:09 - - - - - - 09:39 09:47 Treatment Length: 110 (minutes) Treatment Segments: 4 Vital Signs Capillary Blood Glucose Reference Range: 80 - 120 mg / dl HBO Diabetic Blood Glucose Intervention Range: <131 mg/dl or >249 mg/dl Type: Time Vitals Blood Respiratory Capillary Blood Glucose Pulse Action Pulse: Temperature: Taken: Pressure: Rate: Glucose (mg/dl): Meter #: Oximetry (%) Taken: Pre 07:42 140/78 60 18 97.8 159 1 none per protocol Post 10:09 130/68 72 18 97.9 124 1 none per protocol Treatment Response Treatment Toleration: Well Treatment Completion Status: Treatment Completed without Adverse Event Electronic Signature(s) Signed: 04/01/2022 1:10:02 PM By: [name] RCP, RRT CHT , , Signed: 04/01/2022 1:46:09 PM By: [name] PA-C Entered By: [name] on 04/01/2022 11:22:30 , [name] ([id number]) [id number] Page 2 of 2 -------------------------------------------------------------------------------- HBO Safety Checklist Details Patient Name: Date of Service: Raymond [name]. 04/01/2022 8:00 A M Medical Record Number: [id number] Patient Account Number: [id number] Date of Birth/Sex: Treating RN: [birth year] (57 y.o. Raymond [name]) Carlene Coria Primary Care Jemell [place]: [name] Other Clinician: [name] Referring Kaylee Wombles: Treating Bleu Moisan/Extender: [name] Weeks in Treatment: 12 HBO Safety Checklist Items Safety Checklist Consent Form Signed Patient voided / foley secured and emptied When did you last eato 04/01/22 am Last dose of injectable or oral agent 04/01/22 am Ostomy pouch emptied and vented if applicable NA All implantable devices assessed, documented and approved NA Intravenous access site secured and place NA Valuables secured Linens and cotton and cotton/polyester blend (less than 51% polyester) Personal oil-based products / skin lotions / body lotions removed Wigs or hairpieces removed NA Smoking or tobacco materials removed NA Books / newspapers / magazines / loose paper removed NA Cologne, aftershave, perfume and deodorant removed Jewelry removed (may wrap wedding band) Make-up removed NA Hair care products removed Battery operated devices (external) removed NA Heating patches and chemical warmers removed NA Titanium eyewear removed NA Nail polish cured greater than 10 hours NA Casting material cured greater than 10 hours NA Hearing aids removed NA Loose dentures or partials removed NA Prosthetics have been removed NA Patient demonstrates correct use of air break device (if applicable) Patient concerns have been  addressed Patient grounding bracelet on and cord attached to chamber Specifics for Inpatients (complete in addition to above) Medication sheet sent with patient Intravenous medications needed or due during therapy sent with patient Drainage tubes (e.g. nasogastric tube or chest tube secured and vented) Endotracheal  or Tracheotomy tube secured Cuff deflated of air and inflated with saline Airway suctioned Electronic Signature(s) Signed: 04/01/2022 1:10:02 PM By: [name] RCP, RRT CHT , , Entered By: [name] on 04/01/2022 08:42:26 ,

## 2022-04-04 NOTE — Progress Notes (Signed)
Raymond [name], Raymond [name] ([id number]) [phone].pdf Page 1 of 10 Visit Report for 03/31/2022 Arrival Information Details Patient Name: Date of Service: Raymond [name]. 03/31/2022 11:00 A M Medical Record Number: [id number] Patient Account Number: [id number] Date of Birth/Sex: Treating RN: 06/23/1965 (57 y.o. Raymond [name] Primary Care Gisell Buehrle: [name] Other Clinician: Massie Kluver Referring Alora Gorey: Treating Kolby Myung/Extender: Hazle Coca in Treatment: 12 Visit Information History Since Last Visit All ordered tests and consults were completed: No Patient Arrived: Ambulatory Added or deleted any medications: No Arrival Time: 11:09 Any new allergies or adverse reactions: No Transfer Assistance: None Had a fall or experienced change in No Patient Identification Verified: Yes activities of daily living that may affect Secondary Verification Process Completed: Yes risk of falls: Patient Requires Transmission-Based Precautions: No Signs or symptoms of abuse/neglect since No Patient Has Alerts: Yes last visito Patient Alerts: Type II Diabetic Hospitalized since last visit: No Aspirin '81mg'$  Implantable device outside of the clinic No ABI L [name]>220 excluding cellular tissue based products placed in the center since last visit: Has Dressing in Place as Prescribed: Yes Has Footwear/Offloading in Place as Yes Prescribed: Left: Surgical Shoe with Pressure Relief Insole Pain Present Now: No Electronic Signature(s) Signed: 04/01/2022 1:35:31 PM By: Massie Kluver Entered By: Massie Kluver on 03/31/2022 11:09:46 -------------------------------------------------------------------------------- Clinic Level of Care Assessment Details Patient Name: Date of Service: Raymond [name] 03/31/2022 11:00 A M Medical Record Number: [id number] Patient Account Number: [id number] Date of Birth/Sex: Treating RN: [birth year] (57 y.o.  Raymond [name] Primary Care Kamarian Sahakian: [name] Other Clinician: Massie Kluver Referring Julie Nay: Treating Arty Lantzy/Extender: Hazle Coca in Treatment: [facility] Level of Care Assessment Items TOOL 4 Quantity Score Raymond [name], Raymond [name] ([id number]) [id number] Page 2 of 10 '[]'$  - 0 Use when only an EandM is performed on FOLLOW-UP visit ASSESSMENTS - Nursing Assessment / Reassessment '[]'$  - 0 Reassessment of Co-morbidities (includes updates in patient status) '[]'$  - 0 Reassessment of Adherence to Treatment Plan ASSESSMENTS - Wound and Skin A ssessment / Reassessment X - Simple Wound Assessment / Reassessment - one wound 1 5 '[]'$  - 0 Complex Wound Assessment / Reassessment - multiple wounds '[]'$  - 0 Dermatologic / Skin Assessment (not related to wound area) ASSESSMENTS - Focused Assessment '[]'$  - 0 Circumferential Edema Measurements - multi extremities '[]'$  - 0 Nutritional Assessment / Counseling / Intervention '[]'$  - 0 Lower Extremity Assessment (monofilament, tuning fork, pulses) '[]'$  - 0 Peripheral Arterial Disease Assessment (using hand held doppler) ASSESSMENTS - Ostomy and/or Continence Assessment and Care '[]'$  - 0 Incontinence Assessment and Management '[]'$  - 0 Ostomy Care Assessment and Management (repouching, etc.) PROCESS - Coordination of Care X - Simple Patient / Family Education for ongoing care 1 15 '[]'$  - 0 Complex (extensive) Patient / Family Education for ongoing care '[]'$  - 0 Staff obtains Programmer, systems, Records, T Results / Process Orders est '[]'$  - 0 Staff telephones HHA, Nursing Homes / Clarify orders / etc '[]'$  - 0 Routine Transfer to another Facility (non-emergent condition) '[]'$  - 0 Routine Hospital Admission (non-emergent condition) '[]'$  - 0 New Admissions / Biomedical engineer / Ordering NPWT Apligraf, etc. , '[]'$  - 0 Emergency Hospital Admission (emergent condition) X- 1 10 Simple Discharge Coordination '[]'$  -  0 Complex (extensive) Discharge Coordination PROCESS - Special Needs '[]'$  - 0 Pediatric / Minor Patient Management '[]'$  - 0 Isolation Patient Management '[]'$  - 0 Hearing / Language / Visual special needs '[]'$  - 0 Assessment of  Community assistance (transportation, D/C planning, etc.) '[]'$  - 0 Additional assistance / Altered mentation '[]'$  - 0 Support Surface(s) Assessment (bed, cushion, seat, etc.) INTERVENTIONS - Wound Cleansing / Measurement X - Simple Wound Cleansing - one wound 1 5 '[]'$  - 0 Complex Wound Cleansing - multiple wounds X- 1 5 Wound Imaging (photographs - any number of wounds) '[]'$  - 0 Wound Tracing (instead of photographs) '[]'$  - 0 Simple Wound Measurement - one wound '[]'$  - 0 Complex Wound Measurement - multiple wounds INTERVENTIONS - Wound Dressings '[]'$  - 0 Small Wound Dressing one or multiple wounds '[]'$  - 0 Medium Wound Dressing one or multiple wounds '[]'$  - 0 Large Wound Dressing one or multiple wounds Raymond [name], Raymond [name] ([id number]) [id number] Page 3 of 10 X- 1 5 Application of Medications - topical '[]'$  - 0 Application of Medications - injection INTERVENTIONS - Miscellaneous '[]'$  - 0 External ear exam '[]'$  - 0 Specimen Collection (cultures, biopsies, blood, body fluids, etc.) '[]'$  - 0 Specimen(s) / Culture(s) sent or taken to Lab for analysis '[]'$  - 0 Patient Transfer (multiple staff / Civil Service fast streamer / Similar devices) '[]'$  - 0 Simple Staple / Suture removal (25 or less) '[]'$  - 0 Complex Staple / Suture removal (26 or more) '[]'$  - 0 Hypo / Hyperglycemic Management (close monitor of Blood Glucose) '[]'$  - 0 Ankle / Brachial Index (ABI) - do not check if billed separately X- 1 5 Vital Signs Has the patient been seen at the hospital within the last three years: Yes Total Score: 50 Level Of Care: New/Established - Level 2 Electronic Signature(s) Signed: 04/01/2022 1:35:31 PM By: Massie Kluver Entered By: Massie Kluver on 03/31/2022  11:55:33 -------------------------------------------------------------------------------- Encounter Discharge Information Details Patient Name: Date of Service: Raymond Eke D. 03/31/2022 11:00 A M Medical Record Number: [id number] Patient Account Number: [id number] Date of Birth/Sex: Treating RN: [birth year] (57 y.o. [name], [name] Primary Care Jaymie Mckiddy: [name] Other Clinician: Massie Kluver Referring Leonardo Plaia: Treating Shajuan Musso/Extender: Hazle Coca in Treatment: 12 Encounter Discharge Information Items Discharge Condition: Stable Ambulatory Status: Ambulatory Discharge Destination: Home Transportation: Private Auto Accompanied By: self Schedule Follow-up Appointment: Yes Clinical Summary of Care: Electronic Signature(s) Signed: 04/01/2022 1:35:31 PM By: Massie Kluver Entered By: Massie Kluver on 03/31/2022 13:35:43 Raymond [name] ([id number]) [id number] Page 4 of 10 -------------------------------------------------------------------------------- Lower Extremity Assessment Details Patient Name: Date of Service: Raymond [name]. 03/31/2022 11:00 A M Medical Record Number: [id number] Patient Account Number: [id number] Date of Birth/Sex: Treating RN: 1965-10-04 (57 y.o. Raymond [name] Primary Care Dellie Piasecki: [name] Other Clinician: Massie Kluver Referring Jaklyn [name]: Treating Leylani Duley/Extender: [name] Weeks in Treatment: 12 Vascular Assessment Pulses: Dorsalis Pedis Palpable: [Left:Yes] Electronic Signature(s) Signed: 03/31/2022 6:12:13 PM By: Gretta Cool BSN, RN, CWS, Kim RN, BSN Signed: 04/01/2022 1:35:31 PM By: Massie Kluver Entered By: Massie Kluver on 03/31/2022 11:16:05 -------------------------------------------------------------------------------- Multi Wound Chart Details Patient Name: Date of Service: Raymond Eke D. 03/31/2022 11:00 A M Medical Record  Number: [id number] Patient Account Number: [id number] Date of Birth/Sex: Treating RN: Apr 17, 1965 (57 y.o. Raymond [name] Primary Care Carrigan Delafuente: [name] Other Clinician: Massie Kluver Referring Mukesh Kornegay: Treating Babe Clenney/Extender: [name] Weeks in Treatment: 12 Vital Signs Height(in): 74 Pulse(bpm): 84 Weight(lbs): 371 Blood Pressure(mmHg): 142/64 Body Mass Index(BMI): 47.6 Temperature(F): 98.5 Respiratory Rate(breaths/min): 18 [2:Photos:] [N/A:N/A] Left, Medial Metatarsal head first N/A N/A Wound Location: Raymond [name], Raymond [name] ([id number]) [id number] Page 5 of 10 Gradually Appeared N/A N/A Wounding Event: Diabetic Wound/Ulcer of  the Lower N/A N/A Primary Etiology: Extremity Congestive Heart Failure, N/A N/A Comorbid History: Hypertension, Type II Diabetes, Neuropathy 12/13/2021 N/A N/A Date Acquired: 12 N/A N/A Weeks of Treatment: Open N/A N/A Wound Status: No N/A N/A Wound Recurrence: 0.1x0.1x0.1 N/A N/A Measurements L x W x D (cm) 0.008 N/A N/A A (cm) : rea 0.001 N/A N/A Volume (cm) : 99.40% N/A N/A % Reduction in A rea: 99.70% N/A N/A % Reduction in Volume: Grade 3 N/A N/A Classification: None Present N/A N/A Exudate A mount: Flat and Intact N/A N/A Wound Margin: None Present (0%) N/A N/A Granulation A mount: None Present (0%) N/A N/A Necrotic A mount: Fat Layer (Subcutaneous Tissue): Yes N/A N/A Exposed Structures: Fascia: No Tendon: No Muscle: No Joint: No Bone: No Large (67-100%) N/A N/A Epithelialization: Treatment Notes Electronic Signature(s) Signed: 04/01/2022 1:35:31 PM By: Massie Kluver Entered By: Massie Kluver on 03/31/2022 11:16:09 -------------------------------------------------------------------------------- [place] Details Patient Name: Date of Service: Raymond Eke D. 03/31/2022 11:00 A M Medical Record Number: [id number] Patient  Account Number: [id number] Date of Birth/Sex: Treating RN: 1966/01/07 (57 y.o. [name], [name] Primary Care Cameren Odwyer: [name] Other Clinician: Massie Kluver Referring Chrysta Fulcher: Treating Hannan Tetzlaff/Extender: Hazle Coca in Treatment: 12 Active Inactive Medication Nursing Diagnoses: Knowledge deficit related to medication safety: actual or potential Goals: Patient/caregiver will demonstrate understanding of all current medications Date Initiated: 01/03/2022 T arget Resolution Date: 03/11/2022 Goal Status: Active Interventions: Assess for medication contraindications each visit where new medications are prescribed Assess patient/caregiver ability to manage medication regimen upon admission and as needed Patient/Caregiver given reconciled medication list upon admission, changes in medications and discharge from the [facility] Provide education on medication safety Notes: ROLDAN, COXEY ([id number]) [id number] Page 6 of 10 Pressure Nursing Diagnoses: Knowledge deficit related to causes and risk factors for pressure ulcer development Knowledge deficit related to management of pressures ulcers Potential for impaired tissue integrity related to pressure, friction, moisture, and shear Goals: Patient will remain free from development of additional pressure ulcers Date Initiated: 01/03/2022 Date Inactivated: 03/31/2022 Target Resolution Date: 03/03/2022 Goal Status: Met Patient/caregiver will verbalize risk factors for pressure ulcer development Date Initiated: 01/03/2022 Target Resolution Date: 03/03/2022 Goal Status: Active Patient/caregiver will verbalize understanding of pressure ulcer management Date Initiated: 01/03/2022 Target Resolution Date: 03/03/2022 Goal Status: Active Interventions: Assess potential for pressure ulcer upon admission and as needed Notes: Soft Tissue Infection Nursing Diagnoses: Impaired tissue  integrity Knowledge deficit related to disease process and management Knowledge deficit related to home infection control: handwashing, handling of soiled dressings, supply storage Potential for infection: soft tissue Goals: Patient will remain free of wound infection Date Initiated: 01/03/2022 Date Inactivated: 03/31/2022 Target Resolution Date: 03/03/2022 Goal Status: Met Patient/caregiver will verbalize understanding of or measures to prevent infection and contamination in the home setting Date Initiated: 01/03/2022 Target Resolution Date: 03/03/2022 Goal Status: Active Patient's soft tissue infection will resolve Date Initiated: 01/03/2022 Date Inactivated: 03/03/2022 Target Resolution Date: 02/18/2022 Goal Status: Met Signs and symptoms of infection will be recognized early to allow for prompt treatment Date Initiated: 01/03/2022 Target Resolution Date: 03/03/2022 Goal Status: Active Interventions: Assess signs and symptoms of infection every visit Treatment Activities: Culture and sensitivity : 01/03/2022 Notes: Wound/Skin Impairment Nursing Diagnoses: Impaired tissue integrity Knowledge deficit related to ulceration/compromised skin integrity Goals: Ulcer/skin breakdown will have a volume reduction of 30% by week 4 Date Initiated: 02/18/2022 Target Resolution Date: 02/18/2022 Goal Status: Active Ulcer/skin breakdown will have a volume reduction of  50% by week 8 Date Initiated: 02/18/2022 Target Resolution Date: 03/18/2022 Goal Status: Active Interventions: Assess patient/caregiver ability to obtain necessary supplies Assess ulceration(s) every visit Treatment Activities: Consult for HBO : 02/18/2022 Referred to DME Augustino Savastano for dressing supplies : 02/18/2022 Skin care regimen initiated : 02/18/2022 Notes: Raymond [name], Raymond [name] ([id number]) [id number] Page 7 of 10 Electronic Signature(s) Signed: 03/31/2022 6:12:13 PM By: Gretta Cool, BSN, RN, CWS, Kim RN,  BSN Signed: 04/01/2022 1:35:31 PM By: Massie Kluver Entered By: Massie Kluver on 03/31/2022 13:34:23 -------------------------------------------------------------------------------- Pain Assessment Details Patient Name: Date of Service: Raymond Eke D. 03/31/2022 11:00 A M Medical Record Number: [id number] Patient Account Number: [id number] Date of Birth/Sex: Treating RN: April 04, 1965 (57 y.o. Raymond [name] Primary Care Davien Malone: [name] Other Clinician: Massie Kluver Referring Danella Philson: Treating Sarp Vernier/Extender: [name] Weeks in Treatment: 12 Active Problems Location of Pain Severity and Description of Pain Patient Has Paino No Site Locations Pain Management and Medication Current Pain Management: Electronic Signature(s) Signed: 03/31/2022 6:12:13 PM By: Gretta Cool, BSN, RN, CWS, Kim RN, BSN Signed: 04/01/2022 1:35:31 PM By: Massie Kluver Entered By: Massie Kluver on 03/31/2022 11:14:47 Raymond [name] ([id number]) [id number] Page 8 of 10 -------------------------------------------------------------------------------- Patient/Caregiver Education Details Patient Name: Date of Service: Raymond [name], Raymond M D. 2/29/2024andnbsp11:00 A M Medical Record Number: [id number] Patient Account Number: [id number] Date of Birth/Gender: Treating RN: [birth year] (57 y.o. Raymond [name] Primary Care Physician: [name] Other Clinician: Massie Kluver Referring Physician: Treating Physician/Extender: Hazle Coca in Treatment: 12 Education Assessment Education Provided To: Patient Education Topics Provided Hyperbaric Oxygenation: Handouts: Other: continue HBOT Methods: Explain/Verbal Responses: State content correctly Electronic Signature(s) Signed: 04/01/2022 1:35:31 PM By: Massie Kluver Entered By: Massie Kluver on 03/31/2022  13:34:55 -------------------------------------------------------------------------------- Wound Assessment Details Patient Name: Date of Service: Raymond Eke D. 03/31/2022 11:00 A M Medical Record Number: [id number] Patient Account Number: [id number] Date of Birth/Sex: Treating RN: Oct 07, 1965 (57 y.o. [name], [name] Primary Care Samah Lapiana: [name] Other Clinician: Massie Kluver Referring Sigmond Patalano: Treating Gerrick Ray/Extender: [name] Weeks in Treatment: 12 Wound Status Wound Number: 2 Primary Diabetic Wound/Ulcer of the Lower Extremity Etiology: Wound Location: Left, Medial Metatarsal head first Wound Status: Healed - Epithelialized Wounding Event: Gradually Appeared Comorbid Congestive Heart Failure, Hypertension, Type II Diabetes, Date Acquired: 12/13/2021 History: Neuropathy Weeks Of Treatment: 12 Clustered Wound: No Photos Wound Measurements Length: (cm) Raymond [name], Raymond [name] ([id number]) Width: (cm) Depth: (cm) Area: (cm) Volume: (cm) 0 % Reduction in Area: 100% [id number] Page 9 of 10 0 % Reduction in Volume: 100% 0 Epithelialization: Large (67-100%) 0 0 Wound Description Classification: Grade 3 Wound Margin: Flat and Intact Exudate Amount: None Present Foul Odor After Cleansing: No Slough/Fibrino No Wound Bed Granulation Amount: None Present (0%) Exposed Structure Necrotic Amount: None Present (0%) Fascia Exposed: No Fat Layer (Subcutaneous Tissue) Exposed: Yes Tendon Exposed: No Muscle Exposed: No Joint Exposed: No Bone Exposed: No Treatment Notes Wound #2 (Metatarsal head first) Wound Laterality: Left, Medial Cleanser Peri-Wound Care Topical Primary Dressing Secondary Dressing Secured With Compression Wrap Compression Stockings Add-Ons Electronic Signature(s) Signed: 03/31/2022 6:12:13 PM By: Gretta Cool, BSN, RN, CWS, Kim RN, BSN Signed: 04/01/2022 1:35:31 PM By: Massie Kluver Entered  By: Massie Kluver on 03/31/2022 11:53:06 -------------------------------------------------------------------------------- Vitals Details Patient Name: Date of Service: Raymond [name], Raymond Negus D. 03/31/2022 11:00 A M Medical Record Number: [id number] Patient Account Number: [id number] Date of Birth/Sex: Treating RN: [birth year] (57 y.o. Raymond [name] Primary Care Yakelin Grenier: [name] Other  Clinician: Massie Kluver Referring Jania Steinke: Treating Rondall Radigan/Extender: Hazle Coca in Treatment: 12 Vital Signs Time Taken: 11:10 Temperature (F): 98.5 Height (in): 74 Pulse (bpm): 84 Weight (lbs): 371 Respiratory Rate (breaths/min): 18 Body Mass Index (BMI): 47.6 Blood Pressure (mmHg): 142/64 Reference Range: 80 - 120 mg / dl Raymond [name], Raymond [name] ([id number]) [id number] Page 10 of 10 Electronic Signature(s) Signed: 04/01/2022 1:35:31 PM By: Massie Kluver Entered By: Massie Kluver on 03/31/2022 11:11:08

## 2022-04-04 NOTE — Progress Notes (Signed)
[name], [name] ([id number]) [id number] Page 1 of 8 Visit Report for 03/31/2022 Chief Complaint Document Details Patient Name: Date of Service: Raymond [name]. 03/31/2022 11:00 A M Medical Record Number: [id number] Patient Account Number: [id number] Date of Birth/Sex: Treating RN: 02/24/1965 (57 y.o. [name] Primary Care Provider: [name] Other Clinician: Massie Kluver Referring Provider: Treating Provider/Extender: Hazle Coca in Treatment: 12 Information Obtained from: Patient Chief Complaint Left foot ulcer with chronic osteomyelitis Electronic Signature(s) Signed: 03/31/2022 10:55:10 AM By: [name] PA-C Entered By: [name] on 03/31/2022 10:55:09 -------------------------------------------------------------------------------- HPI Details Patient Name: Date of Service: [name], [name] 03/31/2022 11:00 A M Medical Record Number: [id number] Patient Account Number: [id number] Date of Birth/Sex: Treating RN: 1965-05-09 (57 y.o. [name] Primary Care Provider: [name] Other Clinician: Massie Kluver Referring Provider: Treating Provider/Extender: Hazle Coca in Treatment: 12 History of Present Illness HPI Description: 09/05/17-He is seeing an initial evaluation for a left plantar foot ulcer. He has a remote history of left great toe amputation. He states that 4-6 weeks ago he noted callus formation and ulceration. He has not seen primary care regarding this. He is not currently on antibiotic therapy. He does not routinely follow with podiatry. He states diabetic foot wear will arrive early next week. The EHR shows an A1c of 9% approximate 4 months ago but he states he had one and primary care a few weeks ago but does not know the results. He is neuropathic and does not complain of any pain, he is currently wearing crocs. 09/12/17-he is here in follow  up evaluation for left plantar foot ulcer. There is improvement in both appearance and measurement. We will continue with same treatment plan and he will follow up next week. 09/19/17 on evaluation today patient actually appears to be doing excellent in regard to his ulcer on the invitation site of his left great foot plantar aspect. He's been tolerating the dressing changes without complication. In fact with the Prisma and the current measures he has been shown signs of excellent improvement week by week up to this point. We have been to breeding the wound in this seems to have been of great benefit for him. Fortunately there is no evidence of infection. 09/26/17 on evaluation today patient appears to be doing rather well in regard to his wound. He did not know quite as much improvement this week as compared to last week. Nonetheless he still continues to show signs of improving to some degree. I do believe he may benefit from an offloading shoe. No fevers, chills, nausea, or vomiting noted at this time. SHADOW, ENSING ([id number]) [id number] Page 2 of 8 10/03/17 on evaluation today patient actually appears to be doing much better in regard to the amputation site plantar foot ulcer. Overall this appears significantly smaller even compared to previous. He's been tolerating the dressing changes without complication. He did get his diabetic shoes and I did have a look at them they appear to be fairly good. Nonetheless I do think that for the time being I would probably recommend he continue with the offloading shoe that we have been utilizing just due to the fact that with the dressing I don't know that his diabetic shoes are going to work as appropriately as far as not called an additional pressure and irritation to the area in question. He understands. 10/10/17 on evaluation today patient actually appears to be doing very well  in regard to his plantar foot ulcer. He has  been tolerating the dressing changes without complication. I do feel like he's making signs of good improvement and in fact of the wound bed appears to be better although it may not be significantly changed in size it appears healthier and I do believe is showing signs of improving. Nonetheless we gonna keep working towards healing as far as that is concerned. There is no evidence of infection. 10/17/17 on evaluation today patient actually appears to be doing poorly in regard to his plantar foot ulcer. Unfortunately other than just the area where the wound is itself there appears to be a blister that communicates with the wound unfortunately. This is more lateral to the wound itself and also to the distal point of the amputation site. There does not appear to be any evidence of cellulitis spreading at the foot but I do believe some of the drainage from the site itself is. When in nature. Nonetheless this blister area I think needs to be removed in order to allow for appropriate healing of the ulcer itself I think that is gonna be difficult for it to heal otherwise. This was discussed with the patient today. 10/24/17 on evaluation today patient actually appears to be doing better compared to last week even post debridement. He has shown signs of improvement he did test positive for Staphylococcus aureus. With that being said he notes that he's not have any discomfort and in general he does feel like things seem to been doing better. Fortunately there's no additional blistering and no evidence of remaining infection at this time. No fevers, chills, nausea, or vomiting noted at this time. He has three days of antibiotic left. 10/31/17 on evaluation today patient actually appears to be doing very well in regard to his ulcer on the left foot. He has been tolerating the dressing changes without complication. With that being said fortunately there appears to be no evidence of infection I do think he's made  progress compared to previous. No fevers, chills, nausea, or vomiting noted at this time. 11/14/17 on evaluation today patient actually appears to be doing excellent in regard to his amputation site ulcer on his foot. In fact this appears to be completely healed at this point. There is no evidence of infection nor underline abscess and I did thoroughly evaluate the periwound location. Overall I'm very pleased with how things appear. Readmission: 01-03-2022 upon evaluation today patient appears for reevaluation here in the clinic although it has been since 2019 that I last saw him. This again has been over 4 years. With that being said he is having an issue with a wound on his foot unfortunately. He has not had any recent x-rays he tells me at this point which is unfortunate as avoid definitely can need to get something started that can be one of the primary items to get moving forward with. With that being said I also think were probably can obtain a wound culture he is probably going to require some antibiotics at some point. I just want to make sure that we have him on the right thing when we do this. Currently the patient tells me that his wounds have been present in regard to the met head for about a month at this point. With that being said he also has a wound on the third toe which is the next toe this actually still present. This unfortunately has a lot of callus buildup around it but I  think it is larger underneath than what it would appear on initial inspection. Patient does have a history of diabetes mellitus type 2, congestive heart failure, and COPD. 01-11-2022 upon evaluation patient's wounds actually are showing signs of doing about the same may be just slightly cleaner but definitely not where we want things to be as of yet. Fortunately there does not appear to be any signs of active infection locally nor systemically at this point which is great news. No fevers, chills, nausea,  vomiting, or diarrhea. Of note patient has had his MRI but we do not have the results of that as of yet we are still waiting for the reading on this at this point. 01-18-2022 upon evaluation today patient presents after having had his MRI finally complete. 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 The third and fourth digits seem to be early which is good news. Nonetheless we are still going to need to try to see about getting the infection under control he is already been on the antibiotic therapy which I think has done quite well. In fact I feel like he is showing signs of improvement already. 12/28; patient with a wound on the medial aspect of the left first metatarsal head and an area on the left third toe with slight hammer deformity. He has had a previous left first toe amputation. We are using Iodoflex on the wound. He is a diabetic a recent MRI showed osteomyelitis we have been giving him Cipro and Doxy which he appears to be tolerating well. 02-10-2022 upon evaluation today patient unfortunately has issues still with open wounds of his foot on the left. He does have known osteomyelitis at this point and that is something that we have been treating with oral antibiotics. I actually have not seen him personally since 19 December. In that time he has not had any debridement from that point until today based on what I can see as best I can tell anyway. Has been on Cipro as well as doxycycline. Nonetheless he does still seem to have open wounds today and I think that he would benefit from hyperbaric oxygen therapy. I discussed that with him today as well. 02-18-2022 upon evaluation today patient appears to be doing about the same in regard to his wounds. Fortunately I do not see any signs of infection although he still has areas that seem to initially close down but that he has a lot of callus that still open at both locations most  on the metatarsal region as well as the toe. I think that he is appropriate for proceeding with the hyperbarics and I think the sooner we get this done to try to get these wounds healed the better off he will be. 02-25-2022 upon evaluation today patient's wounds do show signs of being dry get again. We were attempting to pack and some of the alginate dressing instead of doing the collagen as everything was getting very dry but nonetheless this is still continue to be an ongoing issue. My suggestion based on what I am seeing is good to be that we actually switch to doing Xeroform which should hopefully keep this a little bit more moist and from hopefully closing up prematurely. The patient voiced understanding. 03-03-2022 upon evaluation today patient appears to be doing well with regard to his wounds things as before appear to be getting dry. We are using Xeroform to try to keep it  open is much as possible but each time I see him he does tend to cover over the good news is each time he also seems to be getting a little bit smaller which is excellent. Fortunately I do not see any evidence of active infection locally nor systemically which is great news. I think this means that between the antibiotics and the hyperbarics were really on a good track here. 03-10-2022 upon evaluation today patient appears to be doing well currently in regard to his wounds. He is actually showing signs of some good improvement he is also tolerating HBO therapy very well. I do not see any evidence of active infection locally nor systemically which is great news and overall I am extremely pleased in that regard. No fevers, chills, nausea, vomiting, or diarrhea. 03-17-2022 upon evaluation today patient appears to be doing well currently in regard to his wounds in fact the toe is completely healed. The metatarsal head location though not completely healed appears to be doing much better which is great news and overall I am extremely  pleased with where we stand today. 03-24-2022 upon evaluation today patient appears to be doing well currently in regard to his wounds. He has been tolerating the dressing changes without complication. Fortunately there does not appear to be any signs of active infection locally nor systemically in fact I am not so sure this wound is very close to being healed. I did perform some debridement to clearway some of the callus and the patient tolerated this today without complication. Postdebridement the wound bed is significantly improved. 03-31-2022 upon evaluation today patient appears to be doing well currently in regard to his wound which is actually showing signs of being completely healed. This is great news. Fortunately I do believe that the patient is doing much better he still on the oral antibiotics which I think is helping to treat the osteomyelitis is also undergoing hyperbaric oxygen therapy which I think is doing a really good job here as well. JALIJAH, KOOPMANN ([id number]) [id number] Page 3 of 8 Electronic Signature(s) Signed: 03/31/2022 12:47:12 PM By: [name] PA-C Entered By: [name] on 03/31/2022 12:47:11 -------------------------------------------------------------------------------- Physical Exam Details Patient Name: Date of Service: [name] 03/31/2022 11:00 A M Medical Record Number: [id number] Patient Account Number: [id number] Date of Birth/Sex: Treating RN: August 22, 1965 (57 y.o. [name] Primary Care Provider: [name] Other Clinician: Massie Kluver Referring Provider: Treating Provider/Extender: [name] Weeks in Treatment: 12 Constitutional Obese and well-hydrated in no acute distress. Respiratory normal breathing without difficulty. Psychiatric this patient is able to make decisions and demonstrates good insight into disease process. Alert and Oriented x 3. pleasant and  cooperative. Notes Upon inspection patient's wound bed actually showed signs of complete epithelization which is great news and overall I do believe that we are headed in the right direction here. Though he is healed I still think he needs to complete the treatment due to the chronic refractory osteomyelitis. Subsequently he has ended up previously with amputation we will try to prevent this from happening again he still also on the oral antibiotics and will be so for about another month. Electronic Signature(s) Signed: 03/31/2022 12:47:48 PM By: [name] PA-C Entered By: [name] on 03/31/2022 12:47:48 -------------------------------------------------------------------------------- Physician Orders Details Patient Name: Date of Service: [name] 03/31/2022 11:00 A M Medical Record Number: [id number] Patient Account Number: [id number] Date of Birth/Sex: Treating RN: 1965/05/31 (57 y.o.  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 May shower; gently cleanse wound with antibacterial soap, rinse and pat dry prior to dressing wounds Other: - apply bandaid over area for protection Off-Loading Open toe surgical shoe Additional Orders / Instructions Other: -  apply AandD after showering. Do not put on before coming to HBO Electronic Signature(s) Signed: 03/31/2022 5:15:32 PM By: [name] PA-C Signed: 04/01/2022 1:35:31 PM By: Massie Kluver Entered By: Massie Kluver on 03/31/2022 11:55:01 -------------------------------------------------------------------------------- Problem List Details Patient Name: Date of Service: [name] 03/31/2022 11:00 A M Medical Record Number: [id number] Patient Account Number: [id number] Date of Birth/Sex: Treating RN: 1965-04-24 (57 y.o. [name] Primary Care Provider: [name] Other Clinician: Massie Kluver Referring Provider: Treating Provider/Extender: [name] Weeks in Treatment: 12 Active Problems ICD-10 Encounter Code Description Active Date MDM Diagnosis [phone] Chronic multifocal osteomyelitis, left ankle and foot 02/10/2022 No Yes E11.621 Type 2 diabetes mellitus with foot ulcer 01/03/2022 No Yes L97.522 Non-pressure chronic ulcer of other part of left foot with fat layer exposed 01/03/2022 No Yes I50.42 Chronic combined systolic (congestive) and diastolic (congestive) heart failure 01/03/2022 No Yes J44.9 Chronic obstructive pulmonary disease, unspecified 01/03/2022 No Yes Inactive Problems Resolved Problems ANQUAN, GORELICK ([id number]) [id number] Page 5 of 8 Electronic Signature(s) Signed: 03/31/2022 10:55:07 AM By: [name] PA-C Entered By: [name] on 03/31/2022 10:55:06 -------------------------------------------------------------------------------- Progress Note Details Patient Name: Date of Service: [name], [name] 03/31/2022 11:00 A M Medical Record Number: [id number] Patient Account Number: [id number] Date of Birth/Sex: Treating RN: 02-10-1965 (57 y.o. [name] Primary Care Provider: [name] Other Clinician: Massie Kluver Referring Provider: Treating  Provider/Extender: Hazle Coca in Treatment: 12 Subjective Chief Complaint Information obtained from Patient Left foot ulcer with chronic osteomyelitis History of Present Illness (HPI) 09/05/17-He is seeing an initial evaluation for a left plantar foot ulcer. He has a remote history of left great toe amputation. He states that 4-6 weeks ago he noted callus formation and ulceration. He has not seen primary care regarding this. He is not currently on antibiotic therapy. He does not routinely follow with podiatry. He states diabetic foot wear will arrive early next week. The EHR shows an A1c of 9% approximate 4 months ago but he states he had one and primary care a few weeks ago but does not know the results. He is neuropathic and does not complain of any pain, he is currently wearing crocs. 09/12/17-he is here in follow up evaluation for left plantar foot ulcer. There is improvement in both appearance and measurement. We will continue with same treatment plan and he will follow up next week. 09/19/17 on evaluation today patient actually appears to be doing excellent in regard to his ulcer on the invitation site of his left great foot plantar aspect. He's been tolerating the dressing changes without complication. In fact with the Prisma and the current measures  he has been shown signs of excellent improvement week by week up to this point. We have been to breeding the wound in this seems to have been of great benefit for him. Fortunately there is no evidence of infection. 09/26/17 on evaluation today patient appears to be doing rather well in regard to his wound. He did not know quite as much improvement this week as compared to last week. Nonetheless he still continues to show signs of improving to some degree. I do believe he may benefit from an offloading shoe. No fevers, chills, nausea, or vomiting noted at this time. 10/03/17 on evaluation today patient actually appears to be  doing much better in regard to the amputation site plantar foot ulcer. Overall this appears significantly smaller even compared to previous. He's been tolerating the dressing changes without complication. He did get his diabetic shoes and I did have a look at them they appear to be fairly good. Nonetheless I do think that for the time being I would probably recommend he continue with the offloading shoe that we have been utilizing just due to the fact that with the dressing I don't know that his diabetic shoes are going to work as appropriately as far as not called an additional pressure and irritation to the area in question. He understands. 10/10/17 on evaluation today patient actually appears to be doing very well in regard to his plantar foot ulcer. He has been tolerating the dressing changes without complication. I do feel like he's making signs of good improvement and in fact of the wound bed appears to be better although it may not be significantly changed in size it appears healthier and I do believe is showing signs of improving. Nonetheless we gonna keep working towards healing as far as that is concerned. There is no evidence of infection. 10/17/17 on evaluation today patient actually appears to be doing poorly in regard to his plantar foot ulcer. Unfortunately other than just the area where the wound is itself there appears to be a blister that communicates with the wound unfortunately. This is more lateral to the wound itself and also to the distal point of the amputation site. There does not appear to be any evidence of cellulitis spreading at the foot but I do believe some of the drainage from the site itself is. When in nature. Nonetheless this blister area I think needs to be removed in order to allow for appropriate healing of the ulcer itself I think that is gonna be difficult for it to heal otherwise. This was discussed with the patient today. 10/24/17 on evaluation today patient  actually appears to be doing better compared to last week even post debridement. He has shown signs of improvement he did test positive for Staphylococcus aureus. With that being said he notes that he's not have any discomfort and in general he does feel like things seem to been doing better. Fortunately there's no additional blistering and no evidence of remaining infection at this time. No fevers, chills, nausea, or vomiting noted at this time. He has three days of antibiotic left. 10/31/17 on evaluation today patient actually appears to be doing very well in regard to his ulcer on the left foot. He has been tolerating the dressing changes without complication. With that being said fortunately there appears to be no evidence of infection I do think he's made progress compared to previous. No fevers, chills, nausea, or vomiting noted at this time. 11/14/17 on evaluation today patient actually appears  to be doing excellent in regard to his amputation site ulcer on his foot. In fact this appears to be completely healed at this point. There is no evidence of infection nor underline abscess and I did thoroughly evaluate the periwound location. Overall I'm very pleased with how things appear. Readmission: CASSADY, SANCHEZGARCIA ([id number]) [id number] Page 6 of 8 01-03-2022 upon evaluation today patient appears for reevaluation here in the clinic although it has been since 2019 that I last saw him. This again has been over 4 years. With that being said he is having an issue with a wound on his foot unfortunately. He has not had any recent x-rays he tells me at this point which is unfortunate as avoid definitely can need to get something started that can be one of the primary items to get moving forward with. With that being said I also think were probably can obtain a wound culture he is probably going to require some antibiotics at some point. I just want to make sure that we  have him on the right thing when we do this. Currently the patient tells me that his wounds have been present in regard to the met head for about a month at this point. With that being said he also has a wound on the third toe which is the next toe this actually still present. This unfortunately has a lot of callus buildup around it but I think it is larger underneath than what it would appear on initial inspection. Patient does have a history of diabetes mellitus type 2, congestive heart failure, and COPD. 01-11-2022 upon evaluation patient's wounds actually are showing signs of doing about the same may be just slightly cleaner but definitely not where we want things to be as of yet. Fortunately there does not appear to be any signs of active infection locally nor systemically at this point which is great news. No fevers, chills, nausea, vomiting, or diarrhea. Of note patient has had his MRI but we do not have the results of that as of yet we are still waiting for the reading on this at this point. 01-18-2022 upon evaluation today patient presents after having had his MRI finally complete. 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 The third and fourth digits seem to be early which is good news. Nonetheless we are still going to need to try to see about getting the infection under control he is already been on the antibiotic therapy which I think has done quite well. In fact I feel like he is showing signs of improvement already. 12/28; patient with a wound on the medial aspect of the left first metatarsal head and an area on the left third toe with slight hammer deformity. He has had a previous left first toe amputation. We are using Iodoflex on the wound. He is a diabetic a recent MRI showed osteomyelitis we have been giving him Cipro and Doxy which he appears to be tolerating well. 02-10-2022 upon evaluation today patient  unfortunately has issues still with open wounds of his foot on the left. He does have known osteomyelitis at this point and that is something that we have been treating with oral antibiotics. I actually have not seen him personally since 19 December. In that time he has not had any debridement from that point until today based on what I can see as best I can tell anyway. Has been on Cipro  as well as doxycycline. Nonetheless he does still seem to have open wounds today and I think that he would benefit from hyperbaric oxygen therapy. I discussed that with him today as well. 02-18-2022 upon evaluation today patient appears to be doing about the same in regard to his wounds. Fortunately I do not see any signs of infection although he still has areas that seem to initially close down but that he has a lot of callus that still open at both locations most on the metatarsal region as well as the toe. I think that he is appropriate for proceeding with the hyperbarics and I think the sooner we get this done to try to get these wounds healed the better off he will be. 02-25-2022 upon evaluation today patient's wounds do show signs of being dry get again. We were attempting to pack and some of the alginate dressing instead of doing the collagen as everything was getting very dry but nonetheless this is still continue to be an ongoing issue. My suggestion based on what I am seeing is good to be that we actually switch to doing Xeroform which should hopefully keep this a little bit more moist and from hopefully closing up prematurely. The patient voiced understanding. 03-03-2022 upon evaluation today patient appears to be doing well with regard to his wounds things as before appear to be getting dry. We are using Xeroform to try to keep it open is much as possible but each time I see him he does tend to cover over the good news is each time he also seems to be getting a little bit smaller which is excellent.  Fortunately I do not see any evidence of active infection locally nor systemically which is great news. I think this means that between the antibiotics and the hyperbarics were really on a good track here. 03-10-2022 upon evaluation today patient appears to be doing well currently in regard to his wounds. He is actually showing signs of some good improvement he is also tolerating HBO therapy very well. I do not see any evidence of active infection locally nor systemically which is great news and overall I am extremely pleased in that regard. No fevers, chills, nausea, vomiting, or diarrhea. 03-17-2022 upon evaluation today patient appears to be doing well currently in regard to his wounds in fact the toe is completely healed. The metatarsal head location though not completely healed appears to be doing much better which is great news and overall I am extremely pleased with where we stand today. 03-24-2022 upon evaluation today patient appears to be doing well currently in regard to his wounds. He has been tolerating the dressing changes without complication. Fortunately there does not appear to be any signs of active infection locally nor systemically in fact I am not so sure this wound is very close to being healed. I did perform some debridement to clearway some of the callus and the patient tolerated this today without complication. Postdebridement the wound bed is significantly improved. 03-31-2022 upon evaluation today patient appears to be doing well currently in regard to his wound which is actually showing signs of being completely healed. This is great news. Fortunately I do believe that the patient is doing much better he still on the oral antibiotics which I think is helping to treat the osteomyelitis is also undergoing hyperbaric oxygen therapy which I think is doing a really good job here as well. Objective Constitutional Obese and well-hydrated in no acute distress. Vitals Time Taken:  11:10  AM, Height: 74 in, Weight: 371 lbs, BMI: 47.6, Temperature: 98.5 F, Pulse: 84 bpm, Respiratory Rate: 18 breaths/min, Blood Pressure: 142/64 mmHg. Respiratory normal breathing without difficulty. Psychiatric this patient is able to make decisions and demonstrates good insight into disease process. Alert and Oriented x 3. pleasant and cooperative. General Notes: Upon inspection patient's wound bed actually showed signs of complete epithelization which is great news and overall I do believe that we are headed in the right direction here. Though he is healed I still think he needs to complete the treatment due to the chronic refractory osteomyelitis. Subsequently he has ended up previously with amputation we will try to prevent this from happening again he still also on the oral antibiotics and will be so for about another month. Integumentary (Hair, Skin) TRYGG, HOPF ([id number]) [id number] Page 7 of 8 Wound #2 status is Healed - Epithelialized. Original cause of wound was Gradually Appeared. The date acquired was: 12/13/2021. The wound has been in treatment 12 weeks. The wound is located on the Left,Medial Metatarsal head first. The wound measures 0cm length x 0cm width x 0cm depth; 0cm^2 area and 0cm^3 volume. There is Fat Layer (Subcutaneous Tissue) exposed. There is a none present amount of drainage noted. The wound margin is flat and intact. There is no granulation within the wound bed. There is no necrotic tissue within the wound bed. 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 May  shower; gently cleanse wound with antibacterial soap, rinse and pat dry prior to dressing wounds Other: - apply bandaid over area for protection Off-Loading: Open toe surgical shoe Additional Orders / Instructions: Other: - apply AandD after showering. Do not put on before coming to HBO 1. I am going to recommend that the patient should continue to monitor for any signs of infection or worsening. Based on what I am seeing I do believe that he is actually doing very well at this time. I do think that his infection is under good control with the oral antibiotics as well as the hyperbarics he is healing quite nicely and I am extremely pleased in that regard. 2. Also can recommend that we have the patient continue with the AandD ointment to the leg in general he should put this on during the day after showering he should not put it on in the morning before coming in for HBO therapy I made this abundantly Clear to the patient. He voiced understanding. We will see patient back for reevaluation in 2 weeks here in the clinic. If anything worsens or changes patient will contact our office for additional recommendations. Would not continue to keep an eye on things over the next several weeks until he completes HBO therapy to ensure everything remains closed. Electronic Signature(s) Signed: 03/31/2022 12:48:47 PM By: [name] PA-C Entered By: [name] on 03/31/2022 12:48:47 -------------------------------------------------------------------------------- SuperBill Details Patient Name: Date of Service: [name], [name] 03/31/2022 Medical Record Number: [id number] Patient Account Number: [id number] Date of Birth/Sex: Treating RN: 06-Jun-1965 (57 y.o. [name] Primary Care Provider: [name] Other Clinician: Massie Kluver Referring Provider: Treating Provider/Extender: [name] Weeks in Treatment: 12 Diagnosis Coding ICD-10 Codes Code  Description M86.372 Chronic multifocal osteomyelitis, left ankle and foot TASHAWN, DEBARGE ([id number]) [id number] Page 8 of 8 E11.621 Type 2 diabetes mellitus with foot ulcer L97.522 Non-pressure chronic ulcer of other part of left foot with fat layer exposed I50.42 Chronic combined systolic (congestive) and diastolic (congestive) heart failure J44.9 Chronic obstructive pulmonary disease, unspecified Facility Procedures : CPT4 Code: [id number] Description: [id number] - WOUND CARE VISIT-LEV 2 EST PT Modifier: Quantity: 1 Physician Procedures : CPT4 Code Description Modifier [id number] - WC PHYS LEVEL 3 - EST PT ICD-10 Diagnosis Description M86.372 Chronic multifocal osteomyelitis, left ankle and foot E11.621 Type 2 diabetes mellitus with foot ulcer L97.522 Non-pressure chronic ulcer of  other part of left foot with fat layer exposed I50.42 Chronic combined systolic (congestive) and diastolic (congestive) heart failure Quantity: 1 Electronic Signature(s) Signed: 03/31/2022 4:50:41 PM By: [name] PA-C Entered By: [name] on 03/31/2022 16:50:41

## 2022-04-05 ENCOUNTER — Encounter: Payer: Medicare HMO | Admitting: Internal Medicine

## 2022-04-05 DIAGNOSIS — M86372 Chronic multifocal osteomyelitis, left ankle and foot: Secondary | ICD-10-CM | POA: Diagnosis not present

## 2022-04-05 LAB — GLUCOSE, CAPILLARY
Glucose-Capillary: 235 mg/dL — ABNORMAL HIGH (ref 70–99)
Glucose-Capillary: 229 mg/dL — ABNORMAL HIGH (ref 70–99)

## 2022-04-05 NOTE — Progress Notes (Signed)
YERAY, BOLDON ([id number]) [id number] Page 1 of 2 Visit Report for 04/04/2022 Arrival Information Details Patient Name: Date of Service: Raymond [name]. 04/04/2022 8:00 A M Medical Record Number: [id number] Patient Account Number: [id number] Date of Birth/Sex: Treating RN: 1965/06/26 (57 y.o. Raymond [name], Raymond [name] Primary Care Bernell Sigal: [name] Other Clinician: [name] Referring Tiffiny Worthy: Treating Airen Stiehl/Extender: Eldridge Dace, MICHA EL Glenna Fellows in Treatment: 13 Visit Information History Since Last Visit Added or deleted any medications: No Patient Arrived: Ambulatory Any new allergies or adverse reactions: No Arrival Time: 07:45 Had a fall or experienced change in No Accompanied By: self activities of daily living that may affect Transfer Assistance: None risk of falls: Patient Identification Verified: Yes Signs or symptoms of abuse/neglect since last visito No Secondary Verification Process Completed: Yes Hospitalized since last visit: No Patient Requires Transmission-Based Precautions: No Implantable device outside of the clinic excluding No Patient Has Alerts: Yes cellular tissue based products placed in the center Patient Alerts: Type II Diabetic since last visit: Aspirin '81mg'$  Pain Present Now: No ABI L [place]>220 Electronic Signature(s) Signed: 04/04/2022 12:17:07 PM By: [name] RCP, RRT CHT , , Entered By: [name] on 04/04/2022 09:23:22 , -------------------------------------------------------------------------------- Encounter Discharge Information Details Patient Name: Date of Service: Raymond [name], Raymond Negus D. 04/04/2022 8:00 A M Medical Record Number: [id number] Patient Account Number: [id number] Date of Birth/Sex: Treating RN: [birth year] (57 y.o. Raymond [name] Primary Care Chancellor Vanderloop: [name] Other Clinician: [name] Referring Joory Gough: Treating  Jalin Alicea/Extender: Eldridge Dace, MICHA EL Glenna Fellows in Treatment: 13 Encounter Discharge Information Items Discharge Condition: Stable Ambulatory Status: Ambulatory Discharge Destination: Home Transportation: Other Accompanied By: transportation Schedule Follow-up Appointment: Yes Clinical Summary of Care: Raymond [name] ([id number]) [id number] Page 2 of 2 Notes Patient has an HBO treatment scheduled on3/5/247 at 08:00 am. Electronic Signature(s) Signed: 04/04/2022 12:17:07 PM By: [name] RCP, RRT CHT , , Entered By: [name] on 04/04/2022 12:15:01 , -------------------------------------------------------------------------------- Vitals Details Patient Name: Date of Service: Raymond [name], Raymond Negus D. 04/04/2022 8:00 A M Medical Record Number: [id number] Patient Account Number: [id number] Date of Birth/Sex: Treating RN: [birth year] (57 y.o. Raymond [name], Raymond [name] Primary Care Murline Weigel: [name] Other Clinician: [name] Referring Otelia Hettinger: Treating Shawntina Diffee/Extender: Eldridge Dace, [place] EL Glenna Fellows in Treatment: 13 Vital Signs Time Taken: 07:55 Temperature (F): 98.8 Height (in): 74 Pulse (bpm): 78 Weight (lbs): 371 Respiratory Rate (breaths/min): 18 Body Mass Index (BMI): 47.6 Blood Pressure (mmHg): 142/68 Capillary Blood Glucose (mg/dl): 259 Reference Range: 80 - 120 mg / dl Electronic Signature(s) Signed: 04/04/2022 12:17:07 PM By: [name] RCP, RRT CHT , , Entered By: [name] on 04/04/2022 09:23:30 ,

## 2022-04-06 ENCOUNTER — Encounter (HOSPITAL_BASED_OUTPATIENT_CLINIC_OR_DEPARTMENT_OTHER): Payer: Medicare HMO | Admitting: Internal Medicine

## 2022-04-06 DIAGNOSIS — L97522 Non-pressure chronic ulcer of other part of left foot with fat layer exposed: Secondary | ICD-10-CM | POA: Diagnosis not present

## 2022-04-06 DIAGNOSIS — E11621 Type 2 diabetes mellitus with foot ulcer: Secondary | ICD-10-CM | POA: Diagnosis not present

## 2022-04-06 DIAGNOSIS — M86372 Chronic multifocal osteomyelitis, left ankle and foot: Secondary | ICD-10-CM | POA: Diagnosis not present

## 2022-04-06 LAB — GLUCOSE, CAPILLARY
Glucose-Capillary: 225 mg/dL — ABNORMAL HIGH (ref 70–99)
Glucose-Capillary: 185 mg/dL — ABNORMAL HIGH (ref 70–99)

## 2022-04-06 NOTE — Progress Notes (Signed)
GURJOT, PEARDON ([id number]) [phone].pdf Page 1 of 2 Visit Report for 03/31/2022 Arrival Information Details Patient Name: Date of Service: [name]. 03/31/2022 8:00 A M Medical Record Number: [id number] Patient Account Number: [id number] Date of Birth/Sex: Treating RN: 07-02-1965 (57 y.o. [name], [name] Primary Care Michel Eskelson: [name] Other Clinician: [name] Referring Carsten Carstarphen: Treating Jacaden Forbush/Extender: Hazle Coca in Treatment: 12 Visit Information History Since Last Visit Added or deleted any medications: No Patient Arrived: Ambulatory Any new allergies or adverse reactions: No Arrival Time: 07:45 Had a fall or experienced change in No Accompanied By: self activities of daily living that may affect Transfer Assistance: None risk of falls: Patient Identification Verified: Yes Signs or symptoms of abuse/neglect since No Secondary Verification Process Completed: Yes last visito Patient Requires Transmission-Based Precautions: No Hospitalized since last visit: No Patient Has Alerts: Yes Implantable device outside of the clinic No Patient Alerts: Type II Diabetic excluding Aspirin '81mg'$  cellular tissue based products placed in the ABI L [place]>220 center since last visit: Has Dressing in Place as Prescribed: Yes Has Footwear/Offloading in Place as Yes Prescribed: Left: Surgical Shoe with Pressure Relief Insole Pain Present Now: No Electronic Signature(s) Signed: 03/31/2022 1:30:01 PM By: [name] RCP, RRT CHT , , Entered By: [name] on 03/31/2022 08:04:11 , -------------------------------------------------------------------------------- Encounter Discharge Information Details Patient Name: Date of Service: [name], [name] 03/31/2022 8:00 A M Medical Record Number: [id number] Patient Account Number: [id number] Date of Birth/Sex: Treating RN: 08/21/1965 (57  y.o. [name], [name] Primary Care Anahit Klumb: [name] Other Clinician: [name] Referring Ambert Virrueta: Treating Case Vassell/Extender: Hazle Coca in Treatment: 12 Encounter Discharge Information Items Discharge Condition: Stable Ambulatory Status: Ambulatory Discharge Destination: Home ROLANDO, ABLER ([id number]) [phone].pdf Page 2 of 2 Transportation: Other Accompanied By: transportation Schedule Follow-up Appointment: Yes Clinical Summary of Care: Notes Patient has an HBO treatment scheduled on 04/01/22 at 08:00 am. Electronic Signature(s) Signed: 03/31/2022 1:30:01 PM By: [name] RCP, RRT CHT , , Entered By: [name] on 03/31/2022 13:29:39 , -------------------------------------------------------------------------------- Vitals Details Patient Name: Date of Service: [name], [name] 03/31/2022 8:00 A M Medical Record Number: [id number] Patient Account Number: [id number] Date of Birth/Sex: Treating RN: [birth year] (57 y.o. [name], [name] Primary Care Jaheem Hedgepath: [name] Other Clinician: [name] Referring Bodhi Moradi: Treating Frazer [place]/Extender: [name] Weeks in Treatment: 12 Vital Signs Time Taken: 07:48 Temperature (F): 98.5 Height (in): 74 Pulse (bpm): 84 Weight (lbs): 371 Respiratory Rate (breaths/min): 18 Body Mass Index (BMI): 47.6 Blood Pressure (mmHg): 142/64 Capillary Blood Glucose (mg/dl): 200 Reference Range: 80 - 120 mg / dl Electronic Signature(s) Signed: 03/31/2022 1:30:01 PM By: [name] RCP, RRT CHT , , Entered By: [name] on 03/31/2022 08:05:34 ,

## 2022-04-06 NOTE — Progress Notes (Signed)
DEMAREA, MAHONY ([id number]) [id number] Page 1 of 2 Visit Report for 04/05/2022 Arrival Information Details Patient Name: Date of Service: Raymond [name]. 04/05/2022 8:00 A M Medical Record Number: [id number] Patient Account Number: [id number] Date of Birth/Sex: Treating RN: 1965/06/24 (57 y.o. Raymond [name], Raymond [name] Primary Care Aaliyah Gavel: [name] Other Clinician: [name] Referring Ashni Lonzo: Treating Erynne Kealey/Extender: Eldridge Dace, MICHA EL Glenna Fellows in Treatment: 13 Visit Information History Since Last Visit Added or deleted any medications: No Patient Arrived: Ambulatory Any new allergies or adverse reactions: No Arrival Time: 07:45 Had a fall or experienced change in No Accompanied By: transportation activities of daily living that may affect Transfer Assistance: None risk of falls: Patient Identification Verified: Yes Signs or symptoms of abuse/neglect since last visito No Secondary Verification Process Completed: Yes Hospitalized since last visit: No Patient Requires Transmission-Based Precautions: No Implantable device outside of the clinic excluding No Patient Has Alerts: Yes cellular tissue based products placed in the center Patient Alerts: Type II Diabetic since last visit: Aspirin '81mg'$  Pain Present Now: No ABI L [place]>220 Electronic Signature(s) Signed: 04/05/2022 12:45:32 PM By: [name] RCP, RRT CHT , , Entered By: [name] on 04/05/2022 08:05:17 , -------------------------------------------------------------------------------- Encounter Discharge Information Details Patient Name: Date of Service: Raymond [name], Raymond Negus D. 04/05/2022 8:00 A M Medical Record Number: [id number] Patient Account Number: [id number] Date of Birth/Sex: Treating RN: 05/03/1965 (57 y.o. [name] Primary Care Zenovia Justman: [name] Other Clinician: [name] Referring Gunda Maqueda: Treating  Lyndle Pang/Extender: Eldridge Dace, MICHA EL Glenna Fellows in Treatment: 13 Encounter Discharge Information Items Discharge Condition: Stable Ambulatory Status: Ambulatory Discharge Destination: Home Transportation: Other Accompanied By: transportation Schedule Follow-up Appointment: Yes Clinical Summary of Care: SIRE, WEBB ([id number]) [id number] Page 2 of 2 Notes Patient has an HBO treatment scheduled on 04/06/22 at 08:00 am. Electronic Signature(s) Signed: 04/05/2022 12:45:32 PM By: [name] RCP, RRT CHT , , Entered By: [name] on 04/05/2022 12:43:40 , -------------------------------------------------------------------------------- Vitals Details Patient Name: Date of Service: Raymond [name], Raymond Negus D. 04/05/2022 8:00 A M Medical Record Number: [id number] Patient Account Number: [id number] Date of Birth/Sex: Treating RN: December 01, 1965 (57 y.o. Raymond [name], Raymond [name] Primary Care Raymond [name]: [name] Other Clinician: [name] Referring Maeola Mchaney: Treating Audree Schrecengost/Extender: Eldridge Dace, [place] EL Glenna Fellows in Treatment: 13 Vital Signs Time Taken: 07:47 Temperature (F): 98.4 Height (in): 74 Pulse (bpm): 72 Weight (lbs): 371 Respiratory Rate (breaths/min): 18 Body Mass Index (BMI): 47.6 Blood Pressure (mmHg): 130/70 Capillary Blood Glucose (mg/dl): 235 Reference Range: 80 - 120 mg / dl Electronic Signature(s) Signed: 04/05/2022 12:45:32 PM By: [name] RCP, RRT CHT , , Entered By: [name] on 04/05/2022 08:08:07 ,

## 2022-04-07 ENCOUNTER — Encounter: Payer: Medicare HMO | Admitting: Internal Medicine

## 2022-04-07 DIAGNOSIS — M86372 Chronic multifocal osteomyelitis, left ankle and foot: Secondary | ICD-10-CM | POA: Diagnosis not present

## 2022-04-07 LAB — GLUCOSE, CAPILLARY
Glucose-Capillary: 244 mg/dL — ABNORMAL HIGH (ref 70–99)
Glucose-Capillary: 218 mg/dL — ABNORMAL HIGH (ref 70–99)

## 2022-04-07 NOTE — Progress Notes (Signed)
NAITHEN, SCHUPPE ([id number]) [id number] Page 1 of 2 Visit Report for 04/05/2022 HBO Details Patient Name: Date of Service: Raymond [name]. 04/05/2022 8:00 A M Medical Record Number: [id number] Patient Account Number: [id number] Date of Birth/Sex: Treating RN: Jun 07, 1965 (57 y.o. 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 HBO Treatment Details Treatment Number: 29 Patient Type: Outpatient Chamber Type: Monoplace Chamber Serial #: [id number] Treatment Protocol: 2.0 ATA with 90 minutes oxygen, and no air breaks Treatment Details Compression Rate Down: 1.5 psi / minute De-Compression Rate Up: 1.5 psi / minute Air breaks and breathing Decompress Decompress Compress Tx Pressure Begins Reached periods Begins Ends (leave unused spaces blank) Chamber Pressure (ATA 1 2 ------2 1 ) Clock Time (24 hr) 08:02 08:14 - - - - - - 09:44 09:54 Treatment Length: 112 (minutes) Treatment Segments: 4 Vital Signs Capillary Blood Glucose Reference Range: 80 - 120 mg / dl HBO Diabetic Blood Glucose Intervention Range: <131 mg/dl or >249 mg/dl Type: Time Vitals Blood Respiratory Capillary Blood Glucose Pulse Action Pulse: Temperature: Taken: Pressure: Rate: Glucose (mg/dl): Meter #: Oximetry (%) Taken: Pre 07:47 130/70 72 18 98.4 235 1 none per protocol Post 10:12 122/68 78 18 97.8 229 1 none per protocol Treatment Response Treatment Toleration: Well Treatment Completion Status: Treatment Completed without Adverse Event Raymond [name] Notes No concerns with treatment given HBO  Attestation I certify that I supervised this HBO treatment in accordance with Medicare guidelines. A trained emergency response team is readily available per Yes hospital policies and procedures. Continue HBOT as ordered. Yes Electronic Signature(s) MAHENDRA, PRUITTE ([id number]) [id number] Page 2 of 2 Signed: 04/05/2022 4:03:34 PM By: Linton Ham MD Previous Signature: 04/05/2022 12:45:32 PM Version By: [name] RCP, RRT CHT , , Entered By: Linton Ham on 04/05/2022 14:53:59 -------------------------------------------------------------------------------- HBO Safety Checklist Details Patient Name: Date of Service: Raymond [name], Raymond Negus D. 04/05/2022 8:00 A M Medical Record Number: [id number] Patient Account Number: [id number] Date of Birth/Sex: Treating RN: Nov 27, 1965 (57 y.o. [name], [name] Primary Care Shalen Petrak: [name] Other Clinician: [name] Referring Damilola Flamm: Treating Jermale Crass/Extender: Eldridge Dace, MICHA EL Glenna Fellows in Treatment: 13 HBO Safety Checklist Items Safety Checklist Consent Form Signed Patient voided / foley secured and emptied When did you last eato 04/05/22 am Last dose of injectable or oral agent 04/05/22 am metformin Ostomy pouch emptied and vented if applicable NA All implantable devices assessed, documented and approved NA Intravenous access site secured and place NA Valuables secured Linens and cotton and cotton/polyester blend (less than 51% polyester) Personal oil-based products / skin lotions / body lotions removed Wigs or hairpieces removed NA Smoking or tobacco materials removed NA Books / newspapers / magazines / loose paper removed NA Cologne, aftershave, perfume and deodorant removed Jewelry removed (may wrap wedding band) Make-up removed NA Hair care products removed Battery operated devices (external) removed NA Heating patches and chemical warmers  removed NA Titanium eyewear removed NA Nail polish cured greater than 10 hours NA Casting material cured greater than 10 hours NA Hearing aids removed NA Loose dentures or partials removed NA Prosthetics have been removed NA Patient demonstrates correct use of air break device (if applicable)  Patient concerns have been addressed Patient grounding bracelet on and cord attached to chamber Specifics for Inpatients (complete in addition to above) Medication sheet sent with patient Intravenous medications needed or due during therapy sent with patient Drainage tubes (e.g. nasogastric tube or chest tube secured and vented) Endotracheal or Tracheotomy tube secured Cuff deflated of air and inflated with saline Airway suctioned Electronic Signature(s) Signed: 04/05/2022 12:45:32 PM By: [name] RCP, RRT CHT , , Entered By: [name] on 04/05/2022 08:22:37 ,

## 2022-04-07 NOTE — Progress Notes (Signed)
MATHER, KLADIS ([id number]) [id number] Page 1 of 2 Visit Report for 04/06/2022 Arrival Information Details Patient Name: Date of Service: [name]. 04/06/2022 8:00 A M Medical Record Number: [id number] Patient Account Number: [id number] Date of Birth/Sex: Treating RN: 1965-08-27 (57 y.o. [name], [name] Primary Care Jailynn Lavalais: [name] Other Clinician: [name] Referring Analiya Porco: Treating Fabiha Rougeau/Extender: [name] in Treatment: 29 Visit Information History Since Last Visit Added or deleted any medications: No Patient Arrived: Ambulatory Any new allergies or adverse reactions: No Arrival Time: 07:45 Had a fall or experienced change in No Accompanied By: self activities of daily living that may affect Transfer Assistance: None risk of falls: Patient Identification Verified: Yes Signs or symptoms of abuse/neglect since last visito No Secondary Verification Process Completed: Yes Hospitalized since last visit: No Patient Requires Transmission-Based Precautions: No Implantable device outside of the clinic excluding No Patient Has Alerts: Yes cellular tissue based products placed in the center Patient Alerts: Type II Diabetic since last visit: Aspirin '81mg'$  Pain Present Now: No ABI L [place]>220 Electronic Signature(s) Signed: 04/06/2022 10:49:33 AM By: [name] RCP, RRT CHT , , Entered By: [name] on 04/06/2022 09:00:44 , -------------------------------------------------------------------------------- Encounter Discharge Information Details Patient Name: Date of Service: [name], [name] 04/06/2022 8:00 A M Medical Record Number: [id number] Patient Account Number: [id number] Date of Birth/Sex: Treating RN: 1965-06-30 (57 y.o. [name], [name] Primary Care Veronique Warga: [name] Other Clinician: [name] Referring Collins Dimaria: Treating Angeleena Dueitt/Extender:  [name] in Treatment: 13 Encounter Discharge Information Items Discharge Condition: Stable Ambulatory Status: Ambulatory Discharge Destination: Home Transportation: Other Accompanied By: transportation Schedule Follow-up Appointment: Yes Clinical Summary of Care: TYJAY, NYBORG ([id number]) [id number] Page 2 of 2 Notes Patient has an HBO treatment scheduled on 04/07/22 at 08:00 am. Electronic Signature(s) Signed: 04/06/2022 10:49:33 AM By: [name] RCP, RRT CHT , , Entered By: [name] on 04/06/2022 10:49:06 , -------------------------------------------------------------------------------- Vitals Details Patient Name: Date of Service: [name], [name] 04/06/2022 8:00 A M Medical Record Number: [id number] Patient Account Number: [id number] Date of Birth/Sex: Treating RN: Aug 22, 1965 (57 y.o. [name], [name] Primary Care Khan Chura: [name] Other Clinician: [name] Referring Sona Nations: Treating Eshaan Titzer/Extender: [name] in Treatment: 13 Vital Signs Time Taken: 07:56 Temperature (F): 98.5 Height (in): 74 Pulse (bpm): 78 Weight (lbs): 371 Respiratory Rate (breaths/min): 18 Body Mass Index (BMI): 47.6 Blood Pressure (mmHg): 130/74 Capillary Blood Glucose (mg/dl): 225 Reference Range: 80 - 120 mg / dl Electronic Signature(s) Signed: 04/06/2022 10:49:33 AM By: [name] RCP, RRT CHT , , Entered By: [name] on 04/06/2022 09:11:28 ,

## 2022-04-07 NOTE — Progress Notes (Signed)
Raymond [name], Raymond [name] ([id number]) [id number] Page 1 of 2 Visit Report for 04/04/2022 HBO Details Patient Name: Date of Service: Raymond [name] D. 04/04/2022 8:00 A M Medical Record Number: [id number] Patient Account Number: [id number] Date of Birth/Sex: Treating RN: Sep 17, 1965 (57 y.o. 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 HBO Treatment Details Treatment Number: 28 Patient Type: Outpatient Chamber Type: Monoplace Chamber Serial #: [id number] Treatment Protocol: 2.0 ATA with 90 minutes oxygen, and no air breaks Treatment Details Compression Rate Down: 1.5 psi / minute De-Compression Rate Up: 1.5 psi / minute Air breaks and breathing Decompress Decompress Compress Tx Pressure Begins Reached periods Begins Ends (leave unused spaces blank) Chamber Pressure (ATA 1 2 ------2 1 ) Clock Time (24 hr) 08:13 08:25 - - - - - - 09:55 10:05 Treatment Length: 112 (minutes) Treatment Segments: 4 Vital Signs Capillary Blood Glucose Reference Range: 80 - 120 mg / dl HBO Diabetic Blood Glucose Intervention Range: <131 mg/dl or >249 mg/dl Type: Time Vitals Blood Respiratory Capillary Blood Glucose Pulse Action Pulse: Temperature: Taken: Pressure: Rate: Glucose (mg/dl): Meter #: Oximetry (%) Taken: Pre 07:55 142/68 78 18 98.8 259 1 none per protocol Post 10:22 140/64 78 18 97.8 252 1 none per protocol Treatment Response Treatment Toleration: Well Treatment Completion Status: Treatment Completed without Adverse Event Raymond [name] Notes No concerns with treatment given HBO  Attestation I certify that I supervised this HBO treatment in accordance with Medicare guidelines. A trained emergency response team is readily available per Yes hospital policies and procedures. Continue HBOT as ordered. Yes Electronic Signature(s) DESIDERIO, MOSKWA ([id number]) [id number] Page 2 of 2 Signed: 04/05/2022 3:41:05 PM By: Linton Ham MD Previous Signature: 04/04/2022 12:17:07 PM Version By: [name] RCP, RRT CHT , , Previous Signature: 04/04/2022 4:38:26 PM Version By: Linton Ham MD Entered By: Linton Ham on 04/05/2022 07:49:08 -------------------------------------------------------------------------------- HBO Safety Checklist Details Patient Name: Date of Service: Raymond [name] D. 04/04/2022 8:00 A M Medical Record Number: [id number] Patient Account Number: [id number] Date of Birth/Sex: Treating RN: [birth year] (57 y.o. Raymond [name], Raymond [name] Primary Care Voncile Schwarz: Raymond [name] Other Clinician: [name] Referring Ambera Fedele: Treating Sladen Plancarte/Extender: Raymond [name], Raymond [name] Raymond [name] in Treatment: 13 HBO Safety Checklist Items Safety Checklist Consent Form Signed Patient voided / foley secured and emptied When did you last eato 04/04/22 am Last dose of injectable or oral agent 04/04/22 am Ostomy pouch emptied and vented if applicable NA All implantable devices assessed, documented and approved NA Intravenous access site secured and place NA Valuables secured Linens and cotton and cotton/polyester blend (less than 51% polyester) Personal oil-based products / skin lotions / body lotions removed Wigs or hairpieces removed NA Smoking or tobacco materials removed NA Books / newspapers / magazines / loose paper removed NA Cologne, aftershave, perfume and deodorant removed Jewelry removed (may wrap wedding band) Make-up removed NA Hair care products removed Battery operated devices (external)  removed NA Heating patches and chemical warmers removed NA Titanium eyewear removed NA Nail polish cured greater than 10 hours NA Casting material cured greater than 10 hours NA Hearing aids removed NA Loose dentures or partials removed NA Prosthetics have been removed NA Patient  demonstrates correct use of air break device (if applicable) Patient concerns have been addressed Patient grounding bracelet on and cord attached to chamber Specifics for Inpatients (complete in addition to above) Medication sheet sent with patient Intravenous medications needed or due during therapy sent with patient Drainage tubes (e.g. nasogastric tube or chest tube secured and vented) Endotracheal or Tracheotomy tube secured Cuff deflated of air and inflated with saline Airway suctioned Electronic Signature(s) Signed: 04/04/2022 12:17:07 PM By: [name] RCP, RRT CHT , , Entered By: [name] on 04/04/2022 09:23:35 ,

## 2022-04-08 ENCOUNTER — Encounter: Payer: Medicare HMO | Admitting: Internal Medicine

## 2022-04-08 NOTE — Progress Notes (Signed)
Raymond [name] ([id number]) [id number] Page 1 of 2 Visit Report for 04/06/2022 HBO Details Patient Name: Date of Service: Raymond [name]. 04/06/2022 8:00 A M Medical Record Number: [id number] Patient Account Number: [id number] Date of Birth/Sex: Treating RN: Jun 14, 1965 (57 y.o. 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 HBO Treatment Details Treatment Number: 30 Patient Type: Outpatient Chamber Type: Monoplace Chamber Serial #: [id number] Treatment Protocol: 2.0 ATA with 90 minutes oxygen, and no air breaks Treatment Details Compression Rate Down: 1.5 psi / minute De-Compression Rate Up: 2.0 psi / minute Air breaks and breathing Decompress Decompress Compress Tx Pressure Begins Reached periods Begins Ends (leave unused spaces blank) Chamber Pressure (ATA 1 2 ------2 1 ) Clock Time (24 hr) 08:28 08:39 - - - - - - 10:09 10:17 Treatment Length: 109 (minutes) Treatment Segments: 4 Vital Signs Capillary Blood Glucose Reference Range: 80 - 120 mg / dl HBO Diabetic Blood Glucose Intervention Range: <131 mg/dl or >249 mg/dl Type: Time Vitals Blood Respiratory Capillary Blood Glucose Pulse Action Pulse: Temperature: Taken: Pressure: Rate: Glucose (mg/dl): Meter #: Oximetry (%) Taken: Pre 07:56 130/74 78 18 98.5 225 1 none per protocol Post 10:34 130/70 72 18 98.3 185 1 none per protocol Treatment Response Treatment Toleration: Well Treatment Completion Status: Treatment Completed without Adverse Event HBO Attestation I certify that I supervised this HBO treatment in  accordance with Medicare guidelines. A trained emergency response team is readily available per Yes hospital policies and procedures. Continue HBOT as ordered. Yes Electronic Signature(s) Signed: 04/06/2022 2:42:19 PM By: Raymond Shan DO Previous Signature: 04/06/2022 10:49:33 AM Version By: Raymond [name] RCP, RRT CHT , , Entered By: Raymond [name] on 04/06/2022 13:18:57 Raymond [name] ([id number] Page 2 of 2 -------------------------------------------------------------------------------- HBO Safety Checklist Details Patient Name: Date of Service: Raymond [name]. 04/06/2022 8:00 A M Medical Record Number: [id number] Patient Account Number: [id number] Date of Birth/Sex: Treating RN: 1965/11/18 (57 y.o. Raymond [name], Raymond [name] Primary Care Raymond [name]: Raymond [name] Other Clinician: Jacqulyn [name] Referring Raymond [name]: Treating Raymond [name]: Raymond [name] in Treatment: 13 HBO Safety Checklist Items Safety Checklist Consent Form Signed Patient voided / foley secured and emptied When did you last eato 04/06/22 am Last dose of injectable or oral agent 04/06/22 am metformin Ostomy pouch emptied and vented if applicable NA All implantable devices assessed, documented and approved NA Intravenous access site secured and place NA Valuables secured Linens and cotton and cotton/polyester blend (less than 51% polyester) Personal oil-based products / skin lotions / body lotions removed Wigs or hairpieces removed NA Smoking or tobacco materials removed NA Books / newspapers / magazines / loose paper removed NA Cologne, aftershave, perfume and deodorant removed Jewelry removed (may wrap wedding band) Make-up removed NA Hair care products removed Battery operated devices (external) removed NA Heating patches and chemical warmers removed NA Titanium eyewear removed NA Nail polish cured greater than 10  hours NA Casting material cured greater than 10 hours NA Hearing aids removed NA Loose dentures or partials removed NA Prosthetics have been removed NA Patient demonstrates correct use of air break device (if applicable) Patient concerns have been addressed Patient grounding bracelet on and cord attached to chamber Specifics  for Inpatients (complete in addition to above) Medication sheet sent with patient Intravenous medications needed or due during therapy sent with patient Drainage tubes (e.g. nasogastric tube or chest tube secured and vented) Endotracheal or Tracheotomy tube secured Cuff deflated of air and inflated with saline Airway suctioned Electronic Signature(s) Signed: 04/06/2022 10:49:33 AM By: Raymond [name] RCP, RRT CHT , , Entered By: Raymond [name] on 04/06/2022 09:11:33 ,

## 2022-04-08 NOTE — Progress Notes (Signed)
Raymond, [name] ([id number]) [id number] Page 1 of 2 Visit Report for 04/07/2022 Arrival Information Details Patient Name: Date of Service: Raymond [name]. 04/07/2022 8:00 A M Medical Record Number: [id number] Patient Account Number: [id number] Date of Birth/Sex: Treating RN: 1965-02-26 (57 y.o. [name], [name] Primary Care Bedie Dominey: [name] Other Clinician: [name] Referring Amberlie Gaillard: Treating Shaneika Rossa/Extender: Eldridge Dace, MICHA EL Glenna Fellows in Treatment: 13 Visit Information History Since Last Visit Added or deleted any medications: No Patient Arrived: Ambulatory Any new allergies or adverse reactions: No Arrival Time: 07:45 Had a fall or experienced change in No Accompanied By: transportation activities of daily living that may affect Transfer Assistance: None risk of falls: Patient Identification Verified: Yes Signs or symptoms of abuse/neglect since last visito No Secondary Verification Process Completed: Yes Hospitalized since last visit: No Patient Requires Transmission-Based Precautions: No Implantable device outside of the clinic excluding No Patient Has Alerts: Yes cellular tissue based products placed in the center Patient Alerts: Type II Diabetic since last visit: Aspirin '81mg'$  Pain Present Now: No ABI L [place]>220 Electronic Signature(s) Signed: 04/07/2022 11:00:55 AM By: [name] RCP, RRT CHT , , Entered By: [name] on 04/07/2022 08:22:05 , -------------------------------------------------------------------------------- Encounter Discharge Information Details Patient Name: Date of Service: Raymond Quant, [name] 04/07/2022 8:00 A M Medical Record Number: [id number] Patient Account Number: [id number] Date of Birth/Sex: Treating RN: 1965/04/16 (57 y.o. [name] Primary Care Anjelo Pullman: [name] Other Clinician: [name] Referring Randal Yepiz: Treating  Thorvald Orsino/Extender: Eldridge Dace, MICHA EL Glenna Fellows in Treatment: 13 Encounter Discharge Information Items Discharge Condition: Stable Ambulatory Status: Ambulatory Discharge Destination: Home Transportation: Other Accompanied By: transportation Schedule Follow-up Appointment: Yes Clinical Summary of Care: JAICEON, HEITMANN ([id number]) [phone].pdf Page 2 of 2 Notes Patient has an HBO treatment scheduled on 04/08/22 at 08:00 am. Electronic Signature(s) Signed: 04/07/2022 11:00:55 AM By: [name] RCP, RRT CHT , , Entered By: [name] on 04/07/2022 11:00:25 , -------------------------------------------------------------------------------- Vitals Details Patient Name: Date of Service: Raymond Quant, [name] 04/07/2022 8:00 A M Medical Record Number: [id number] Patient Account Number: [id number] Date of Birth/Sex: Treating RN: 10/26/1965 (57 y.o. [name], [name] Primary Care Lynnetta Tom: [name] Other Clinician: [name] Referring Cortni Tays: Treating Tristy Udovich/Extender: Eldridge Dace, [place] EL Glenna Fellows in Treatment: 13 Vital Signs Time Taken: 07:52 Temperature (F): 98.2 Height (in): 74 Pulse (bpm): 84 Weight (lbs): 371 Respiratory Rate (breaths/min): 18 Body Mass Index (BMI): 47.6 Blood Pressure (mmHg): 140/78 Capillary Blood Glucose (mg/dl): 244 Reference Range: 80 - 120 mg / dl Electronic Signature(s) Signed: 04/07/2022 11:00:55 AM By: [name] RCP, RRT CHT , , Entered By: [name] on 04/07/2022 08:22:39 ,

## 2022-04-09 NOTE — Progress Notes (Signed)
MANG, TAILOR ([id number]) [id number] Page 1 of 2 Visit Report for 04/07/2022 HBO Details Patient Name: Date of Service: Raymond Eke D. 04/07/2022 8:00 A M Medical Record Number: [id number] Patient Account Number: [id number] Date of Birth/Sex: Treating RN: [birth year] (57 y.o. 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 HBO Treatment Details Treatment Number: 31 Patient Type: Outpatient Chamber Type: Monoplace Chamber Serial #: [id number] Treatment Protocol: 2.0 ATA with 90 minutes oxygen, and no air breaks Treatment Details Compression Rate Down: 1.5 psi / minute De-Compression Rate Up: 1.5 psi / minute Air breaks and breathing Decompress Decompress Compress Tx Pressure Begins Reached periods Begins Ends (leave unused spaces blank) Chamber Pressure (ATA 1 2 ------2 1 ) Clock Time (24 hr) 08:06 08:18 - - - - - - 09:49 09:59 Treatment Length: 113 (minutes) Treatment Segments: 4 Vital Signs Capillary Blood Glucose Reference Range: 80 - 120 mg / dl HBO Diabetic Blood Glucose Intervention Range: <131 mg/dl or >249 mg/dl Type: Time Vitals Blood Respiratory Capillary Blood Glucose Pulse Action Pulse: Temperature: Taken: Pressure: Rate: Glucose (mg/dl): Meter #: Oximetry (%) Taken: Pre 07:52 140/78 84 18 98.2 244 1 none per protocol Post 10:23 140/76 72 18 98.2 218 1 none per protocol Treatment Response Treatment Toleration: Well Treatment Completion Status: Treatment Completed without Adverse Event Patricia Fargo Notes No concerns with treatment given HBO  Attestation I certify that I supervised this HBO treatment in accordance with Medicare guidelines. A trained emergency response team is readily available per Yes hospital policies and procedures. Continue HBOT as ordered. Yes Electronic Signature(s) KENAAN, RUETER ([id number]) [id number] Page 2 of 2 Signed: 04/07/2022 4:44:08 PM By: Linton Ham MD Previous Signature: 04/07/2022 11:00:55 AM Version By: [name] RCP, RRT CHT , , Entered By: Linton Ham on 04/07/2022 16:21:03 -------------------------------------------------------------------------------- HBO Safety Checklist Details Patient Name: Date of Service: Raymond [name], Raymond Negus D. 04/07/2022 8:00 A M Medical Record Number: [id number] Patient Account Number: [id number] Date of Birth/Sex: Treating RN: December 07, 1965 (57 y.o. [name], [name] Primary Care Sou Nohr: [name] Other Clinician: [name] Referring Maykel Reitter: Treating Felita Bump/Extender: Eldridge Dace, MICHA EL Glenna Fellows in Treatment: 13 HBO Safety Checklist Items Safety Checklist Consent Form Signed Patient voided / foley secured and emptied When did you last eato 04/07/22 am Last dose of injectable or oral agent 04/07/22 am metformin Ostomy pouch emptied and vented if applicable NA All implantable devices assessed, documented and approved NA Intravenous access site secured and place NA Valuables secured Linens and cotton and cotton/polyester blend (less than 51% polyester) Personal oil-based products / skin lotions / body lotions removed Wigs or hairpieces removed NA Smoking or tobacco materials removed NA Books / newspapers / magazines / loose paper removed NA Cologne, aftershave, perfume and deodorant removed Jewelry removed (may wrap wedding band) Make-up removed NA Hair care products removed Battery operated devices (external) removed NA Heating patches and chemical warmers  removed NA Titanium eyewear removed NA Nail polish cured greater than 10 hours NA Casting material cured greater than 10 hours NA Hearing aids removed NA Loose dentures or partials removed NA Prosthetics have been removed NA Patient demonstrates correct use of air break device (if applicable)  Patient concerns have been addressed Patient grounding bracelet on and cord attached to chamber Specifics for Inpatients (complete in addition to above) Medication sheet sent with patient Intravenous medications needed or due during therapy sent with patient Drainage tubes (e.g. nasogastric tube or chest tube secured and vented) Endotracheal or Tracheotomy tube secured Cuff deflated of air and inflated with saline Airway suctioned Electronic Signature(s) Signed: 04/07/2022 11:00:55 AM By: [name] RCP, RRT CHT , , Entered By: [name] on 04/07/2022 08:23:51 ,

## 2022-04-11 ENCOUNTER — Encounter: Payer: Medicare HMO | Admitting: Internal Medicine

## 2022-04-11 ENCOUNTER — Encounter (HOSPITAL_BASED_OUTPATIENT_CLINIC_OR_DEPARTMENT_OTHER): Payer: Medicare HMO | Admitting: Internal Medicine

## 2022-04-11 DIAGNOSIS — E11621 Type 2 diabetes mellitus with foot ulcer: Secondary | ICD-10-CM

## 2022-04-11 DIAGNOSIS — L97522 Non-pressure chronic ulcer of other part of left foot with fat layer exposed: Secondary | ICD-10-CM

## 2022-04-11 DIAGNOSIS — M86372 Chronic multifocal osteomyelitis, left ankle and foot: Secondary | ICD-10-CM | POA: Diagnosis not present

## 2022-04-11 LAB — GLUCOSE, CAPILLARY
Glucose-Capillary: 287 mg/dL — ABNORMAL HIGH (ref 70–99)
Glucose-Capillary: 333 mg/dL — ABNORMAL HIGH (ref 70–99)

## 2022-04-11 NOTE — Progress Notes (Signed)
ROWE, SILKWORTH ([id number]) [id number] Page 1 of 2 Visit Report for 04/11/2022 Arrival Information Details Patient Name: Date of Service: [name]. 04/11/2022 8:00 A M Medical Record Number: [id number] Patient Account Number: [id number] Date of Birth/Sex: Treating RN: 04-Jul-1965 (57 y.o. [name], [name] Primary Care Y[place] Roads: [name] Other Clinician: [name] Referring Cruze Zingaro: Treating Jahmad Petrich/Extender: [name] in Treatment: 14 Visit Information History Since Last Visit Added or deleted any medications: No Patient Arrived: Ambulatory Any new allergies or adverse reactions: No Arrival Time: 08:10 Had a fall or experienced change in No Accompanied By: self activities of daily living that may affect Transfer Assistance: None risk of falls: Patient Identification Verified: Yes Signs or symptoms of abuse/neglect since last visito No Secondary Verification Process Completed: Yes Hospitalized since last visit: No Patient Requires Transmission-Based Precautions: No Implantable device outside of the clinic excluding No Patient Has Alerts: Yes cellular tissue based products placed in the center Patient Alerts: Type II Diabetic since last visit: Aspirin '81mg'$  Pain Present Now: No ABI L [place]>220 Electronic Signature(s) Signed: 04/11/2022 1:19:07 PM By: [name] RCP, RRT CHT , , Entered By: [name] on 04/11/2022 09:19:00 , -------------------------------------------------------------------------------- Encounter Discharge Information Details Patient Name: Date of Service: [name], [name] 04/11/2022 8:00 A M Medical Record Number: [id number] Patient Account Number: [id number] Date of Birth/Sex: Treating RN: 08-Jun-1965 (57 y.o. [name], [name] Primary Care Courtlynn Holloman: [name] Other Clinician: [name] Referring Harlow Basley: Treating  Bay Wayson/Extender: [name] in Treatment: 14 Encounter Discharge Information Items Discharge Condition: Stable Ambulatory Status: Ambulatory Discharge Destination: Home Transportation: Other Accompanied By: transportation Schedule Follow-up Appointment: Yes Clinical Summary of Care: NESANEL, NYKAZA ([id number]) [phone].pdf Page 2 of 2 Notes Patient has an HBO treatment scheduled on 04/12/22 at 08:00 am. Electronic Signature(s) Signed: 04/11/2022 1:19:07 PM By: [name] RCP, RRT CHT , , Entered By: [name] on 04/11/2022 13:16:02 , -------------------------------------------------------------------------------- Vitals Details Patient Name: Date of Service: [name], [name] 04/11/2022 8:00 A M Medical Record Number: [id number] Patient Account Number: [id number] Date of Birth/Sex: Treating RN: 12-21-1965 (57 y.o. [name], [name] Primary Care Gennavieve Huq: [name] Other Clinician: [name] Referring Trey Bebee: Treating Colvin Blatt/Extender: [name] in Treatment: 14 Vital Signs Time Taken: 08:11 Temperature (F): 97.8 Height (in): 74 Pulse (bpm): 85 Weight (lbs): 371 Respiratory Rate (breaths/min): 18 Body Mass Index (BMI): 47.6 Blood Pressure (mmHg): 140/68 Capillary Blood Glucose (mg/dl): 287 Reference Range: 80 - 120 mg / dl Airway Pulse Oximetry (%): 95 Electronic Signature(s) Signed: 04/11/2022 1:19:07 PM By: [name] RCP, RRT CHT , , Entered By: [name] on 04/11/2022 09:19:43 ,

## 2022-04-12 ENCOUNTER — Encounter (HOSPITAL_BASED_OUTPATIENT_CLINIC_OR_DEPARTMENT_OTHER): Payer: Medicare HMO | Admitting: Internal Medicine

## 2022-04-12 ENCOUNTER — Encounter: Payer: Medicare HMO | Admitting: Internal Medicine

## 2022-04-12 DIAGNOSIS — M86372 Chronic multifocal osteomyelitis, left ankle and foot: Secondary | ICD-10-CM

## 2022-04-12 DIAGNOSIS — L97522 Non-pressure chronic ulcer of other part of left foot with fat layer exposed: Secondary | ICD-10-CM | POA: Diagnosis not present

## 2022-04-12 DIAGNOSIS — E11621 Type 2 diabetes mellitus with foot ulcer: Secondary | ICD-10-CM | POA: Diagnosis not present

## 2022-04-12 LAB — GLUCOSE, CAPILLARY
Glucose-Capillary: 393 mg/dL — ABNORMAL HIGH (ref 70–99)
Glucose-Capillary: 369 mg/dL — ABNORMAL HIGH (ref 70–99)

## 2022-04-12 NOTE — Progress Notes (Signed)
Raymond [name] ([id number]) [id number] Page 1 of 2 Visit Report for 04/12/2022 Arrival Information Details Patient Name: Date of Service: Raymond [name]. 04/12/2022 8:00 A M Medical Record Number: [id number] Patient Account Number: [id number] Date of Birth/Sex: Treating RN: 1965-05-29 (57 y.o. Raymond [name], Raymond [name] Primary Care Tristan Proto: [name] Other Clinician: [name] Referring Andrena Margerum: Treating Glenola Wheat/Extender: [name] in Treatment: 43 Visit Information History Since Last Visit Added or deleted any medications: No Patient Arrived: Ambulatory Any new allergies or adverse reactions: No Arrival Time: 07:54 Had a fall or experienced change in No Accompanied By: transportation activities of daily living that may affect Transfer Assistance: None risk of falls: Patient Identification Verified: Yes Signs or symptoms of abuse/neglect since last visito No Secondary Verification Process Completed: Yes Hospitalized since last visit: No Patient Requires Transmission-Based Precautions: No Implantable device outside of the clinic excluding No Patient Has Alerts: Yes cellular tissue based products placed in the center Patient Alerts: Type II Diabetic since last visit: Aspirin '81mg'$  Pain Present Now: No ABI L [place]>220 Electronic Signature(s) Signed: 04/12/2022 1:59:24 PM By: [name] RCP, RRT CHT , , Entered By: [name] on 04/12/2022 08:37:50 , -------------------------------------------------------------------------------- Encounter Discharge Information Details Patient Name: Date of Service: Raymond [name], Elder Raymond D. 04/12/2022 8:00 A M Medical Record Number: [id number] Patient Account Number: [id number] Date of Birth/Sex: Treating RN: 18-Sep-1965 (57 y.o. Raymond [name], Raymond [name] Primary Care Jazmin Vensel: [name] Other Clinician: [name] Referring Halil Rentz: Treating  Vena Bassinger/Extender: [name] in Treatment: 14 Encounter Discharge Information Items Discharge Condition: Stable Ambulatory Status: Ambulatory Discharge Destination: Home Transportation: Other Accompanied By: transportation Schedule Follow-up Appointment: Yes Clinical Summary of Care: LOU, SANDBORN ([id number]) [phone].pdf Page 2 of 2 Notes Patient has an HBO treatment scheduled on 04/18/22 at 08:00 am. Electronic Signature(s) Signed: 04/12/2022 1:59:24 PM By: [name] RCP, RRT CHT , , Entered By: [name] on 04/12/2022 13:58:57 , -------------------------------------------------------------------------------- Vitals Details Patient Name: Date of Service: Raymond [name], Elder Raymond D. 04/12/2022 8:00 A M Medical Record Number: [id number] Patient Account Number: [id number] Date of Birth/Sex: Treating RN: [birth year] (57 y.o. Raymond [name], Raymond [name] Primary Care Raymond [name]: [name] Other Clinician: [name] Referring Raymond [name]: Treating Erhard Senske/Extender: [name] in Treatment: 14 Vital Signs Time Taken: 07:54 Temperature (F): 98.3 Height (in): 74 Pulse (bpm): 92 Weight (lbs): 371 Respiratory Rate (breaths/min): 18 Body Mass Index (BMI): 47.6 Blood Pressure (mmHg): 140/64 Capillary Blood Glucose (mg/dl): 393 Reference Range: 80 - 120 mg / dl Electronic Signature(s) Signed: 04/12/2022 1:59:24 PM By: [name] RCP, RRT CHT , , Entered By: [name] on 04/12/2022 08:39:05 ,

## 2022-04-12 NOTE — Progress Notes (Signed)
KENYATA, SITTLER ([id number]) [id number] Page 1 of 2 Visit Report for 04/11/2022 HBO Details Patient Name: Date of Service: Raymond Eke D. 04/11/2022 8:00 A M Medical Record Number: [id number] Patient Account Number: [id number] Date of Birth/Sex: Treating RN: [birth year] (57 y.o. 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 HBO Treatment Details Treatment Number: 32 Patient Type: Outpatient Chamber Type: Monoplace Chamber Serial #: [id number] Treatment Protocol: 2.0 ATA with 90 minutes oxygen, and no air breaks Treatment Details Compression Rate Down: 1.5 psi / minute De-Compression Rate Up: 1.5 psi / minute Air breaks and breathing Decompress Decompress Compress Tx Pressure Begins Reached periods Begins Ends (leave unused spaces blank) Chamber Pressure (ATA 1 2 ------2 1 ) Clock Time (24 hr) 08:27 08:39 - - - - - - 10:10 10:20 Treatment Length: 113 (minutes) Treatment Segments: 4 Vital Signs Capillary Blood Glucose Reference Range: 80 - 120 mg / dl HBO Diabetic Blood Glucose Intervention Range: <131 mg/dl or >249 mg/dl Type: Time Vitals Blood Respiratory Capillary Blood Glucose Pulse Action Pulse: Temperature: Taken: Pressure: Rate: Glucose (mg/dl): Meter #: Oximetry (%) Taken: Pre 08:11 140/68 85 18 97.8 287 1 95 none per protocol Post 10:27 126/80 80 18 98.1 333 1 none per protocol Treatment Response Treatment Toleration: Well Treatment Completion Status: Treatment Completed without Adverse Event HBO Attestation I certify that I supervised this HBO treatment in  accordance with Medicare guidelines. A trained emergency response team is readily available per Yes hospital policies and procedures. Continue HBOT as ordered. Yes Electronic Signature(s) Signed: 04/11/2022 1:19:07 PM By: [name] RCP, RRT CHT , , Signed: 04/12/2022 1:27:41 PM By: [name] DO Previous Signature: 04/11/2022 1:05:58 PM Version By: Sherrin Daisy ([id number] Page 2 of 2 Entered By: [name] on 04/11/2022 13:14:57 , -------------------------------------------------------------------------------- HBO Safety Checklist Details Patient Name: Date of Service: Raymond Ponto. 04/11/2022 8:00 A M Medical Record Number: [id number] Patient Account Number: [id number] Date of Birth/Sex: Treating RN: 1965/02/09 (57 y.o. [name], [name] Primary Care Shirlean Berman: [name] Other Clinician: [name] Referring Jayln Branscom: Treating Luisantonio Adinolfi/Extender: [name] in Treatment: 14 HBO Safety Checklist Items Safety Checklist Consent Form Signed Patient voided / foley secured and emptied When did you last eato 04/11/22 am Last dose of injectable or oral agent 04/11/22 am metformin Ostomy pouch emptied and vented if applicable NA All implantable devices assessed, documented and approved NA Intravenous access site secured and place NA Valuables secured Linens and cotton and cotton/polyester blend (less than 51% polyester) Personal oil-based products / skin lotions / body lotions removed Wigs or hairpieces removed NA Smoking or tobacco materials removed NA Books / newspapers / magazines / loose paper removed NA Cologne, aftershave, perfume and deodorant removed Jewelry removed (may wrap wedding band) Make-up removed NA Hair care products removed Battery operated devices (external) removed NA Heating patches and chemical warmers  removed NA Titanium eyewear removed NA Nail polish cured greater than 10 hours NA Casting material cured greater than 10 hours NA Hearing aids removed NA Loose dentures or partials removed NA Prosthetics have been removed NA Patient demonstrates correct use of air break device (if applicable) Patient concerns have been  addressed Patient grounding bracelet on and cord attached to chamber Specifics for Inpatients (complete in addition to above) Medication sheet sent with patient Intravenous medications needed or due during therapy sent with patient Drainage tubes (e.g. nasogastric tube or chest tube secured and vented) Endotracheal or Tracheotomy tube secured Cuff deflated of air and inflated with saline Airway suctioned Electronic Signature(s) Signed: 04/11/2022 1:19:07 PM By: [name] RCP, RRT CHT , , Entered By: [name] on 04/11/2022 09:25:22 ,

## 2022-04-13 ENCOUNTER — Encounter: Payer: Medicare HMO | Admitting: Internal Medicine

## 2022-04-13 NOTE — Progress Notes (Signed)
LUCA, BARTIK ([id number]) [id number] Page 1 of 2 Visit Report for 04/12/2022 HBO Details Patient Name: Date of Service: Raymond [name]. 04/12/2022 8:00 A M Medical Record Number: [id number] Patient Account Number: [id number] Date of Birth/Sex: Treating RN: [birth year] (57 y.o. 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 HBO Treatment Details Treatment Number: 33 Patient Type: Outpatient Chamber Type: Monoplace Chamber Serial #: [id number] Treatment Protocol: 2.0 ATA with 90 minutes oxygen, and no air breaks Treatment Details Compression Rate Down: 1.5 psi / minute De-Compression Rate Up: 2.0 psi / minute Air breaks and breathing Decompress Decompress Compress Tx Pressure Begins Reached periods Begins Ends (leave unused spaces blank) Chamber Pressure (ATA 1 2 ------2 1 ) Clock Time (24 hr) 08:28 08:40 - - - - - - 10:10 10:20 Treatment Length: 112 (minutes) Treatment Segments: 4 Vital Signs Capillary Blood Glucose Reference Range: 80 - 120 mg / dl HBO Diabetic Blood Glucose Intervention Range: <131 mg/dl or >249 mg/dl Type: Time Vitals Blood Respiratory Capillary Blood Glucose Pulse Action Pulse: Temperature: Taken: Pressure: Rate: Glucose (mg/dl): Meter #: Oximetry (%) Taken: Pre 07:54 140/64 92 18 98.3 393 1 none per protocol Post 10:26 134/64 83 18 98.5 369 1 none per protocol Treatment Response Treatment Toleration: Well Treatment Completion Status: Treatment Completed without Adverse Event HBO Attestation I certify that I supervised this HBO treatment in  accordance with Medicare guidelines. A trained emergency response team is readily available per Yes hospital policies and procedures. Continue HBOT as ordered. Yes Electronic Signature(s) Signed: 04/12/2022 3:46:00 PM By: Raymond Shan DO Previous Signature: 04/12/2022 1:59:24 PM Version By: Raymond [name] Raymond [name], Raymond [name] , , Previous Signature: 04/12/2022 2:40:35 PM Version By: Raymond Shan DO Previous Signature: 04/12/2022 1:27:41 PM Version By: Raymond [name] ([id number]) [id number] Page 2 of 2 Entered By: Raymond [name] on 04/12/2022 15:06:29 -------------------------------------------------------------------------------- HBO Safety Checklist Details Patient Name: Date of Service: Raymond [name]. 04/12/2022 8:00 A M Medical Record Number: [id number] Patient Account Number: [id number] Date of Birth/Sex: Treating RN: 1965-12-27 (57 y.o. Raymond [name], Raymond [name] Primary Care Raymond [name]: Raymond [name] Other Clinician: Jacqulyn [name] Referring Raymond [name]: Treating Raymond [name]/Extender: Raymond [name] in Treatment: 14 HBO Safety Checklist Items Safety Checklist Consent Form Signed Patient voided / foley secured and emptied When did you last eato 04/12/22 am Last dose of injectable or oral agent 04/12/22 am Ostomy pouch emptied and vented if applicable NA All implantable devices assessed, documented and approved NA Intravenous access site secured and place NA Valuables secured Linens and cotton and cotton/polyester blend (less than 51% polyester) Personal oil-based products / skin lotions / body lotions removed Wigs or hairpieces removed NA Smoking or tobacco materials removed NA Books / newspapers / magazines / loose paper removed NA Cologne, aftershave, perfume and deodorant removed Jewelry removed (may wrap wedding band) Make-up removed NA Hair care products removed Battery operated devices  (external) removed NA Heating patches and chemical warmers removed NA Titanium eyewear removed NA Nail polish cured greater than 10 hours NA Casting material cured greater than 10 hours NA Hearing aids removed NA Loose dentures or partials removed NA Prosthetics have been removed NA Patient demonstrates correct use of air  break device (if applicable) Patient concerns have been addressed Patient grounding bracelet on and cord attached to chamber Specifics for Inpatients (complete in addition to above) Medication sheet sent with patient Intravenous medications needed or due during therapy sent with patient Drainage tubes (e.g. nasogastric tube or chest tube secured and vented) Endotracheal or Tracheotomy tube secured Cuff deflated of air and inflated with saline Airway suctioned Electronic Signature(s) Signed: 04/12/2022 1:59:24 PM By: Raymond [name] Raymond [name], Raymond [name] , , Entered By: Raymond [name] on 04/12/2022 08:58:59 ,

## 2022-04-14 ENCOUNTER — Encounter: Payer: Medicare HMO | Admitting: Internal Medicine

## 2022-04-15 ENCOUNTER — Encounter: Payer: Medicare HMO | Admitting: Internal Medicine

## 2022-04-18 ENCOUNTER — Encounter: Payer: Medicare HMO | Admitting: Physician Assistant

## 2022-04-18 DIAGNOSIS — M86372 Chronic multifocal osteomyelitis, left ankle and foot: Secondary | ICD-10-CM | POA: Diagnosis not present

## 2022-04-18 LAB — GLUCOSE, CAPILLARY
Glucose-Capillary: 234 mg/dL — ABNORMAL HIGH (ref 70–99)
Glucose-Capillary: 262 mg/dL — ABNORMAL HIGH (ref 70–99)

## 2022-04-18 NOTE — Progress Notes (Signed)
JONG, NELLS ([id number]) [id number] Page 1 of 2 Visit Report for 04/18/2022 Arrival Information Details Patient Name: Date of Service: Raymond [name]. 04/18/2022 8:00 A M Medical Record Number: [id number] Patient Account Number: [id number] Date of Birth/Sex: Treating RN: 1965-02-11 (57 y.o. [name], [name] Primary Care Alawna Graybeal: [name] Other Clinician: [name] Referring Mollye Guinta: Treating Danyiel Crespin/Extender: Hazle Coca in Treatment: 38 Visit Information History Since Last Visit Added or deleted any medications: No Patient Arrived: Ambulatory Any new allergies or adverse reactions: No Arrival Time: 07:45 Had a fall or experienced change in No Accompanied By: transportation activities of daily living that may affect Transfer Assistance: None risk of falls: Patient Identification Verified: Yes Signs or symptoms of abuse/neglect since last visito No Secondary Verification Process Completed: Yes Hospitalized since last visit: No Patient Requires Transmission-Based Precautions: No Implantable device outside of the clinic excluding No Patient Has Alerts: Yes cellular tissue based products placed in the center Patient Alerts: Type II Diabetic since last visit: Aspirin 81mg  Pain Present Now: No ABI L [place]>220 Electronic Signature(s) Signed: 04/18/2022 12:46:47 PM By: [name] RCP, RRT CHT , , Entered By: [name] on 04/18/2022 09:13:18 , -------------------------------------------------------------------------------- Encounter Discharge Information Details Patient Name: Date of Service: Raymond [name], Raymond Negus D. 04/18/2022 8:00 A M Medical Record Number: [id number] Patient Account Number: [id number] Date of Birth/Sex: Treating RN: 1965-05-25 (57 y.o. [name], [name] Primary Care Wilma Michaelson: [name] Other Clinician: [name] Referring Zurich Carreno: Treating  Dustan Hyams/Extender: Hazle Coca in Treatment: 15 Encounter Discharge Information Items Discharge Condition: Stable Ambulatory Status: Ambulatory Discharge Destination: Home Transportation: Other Accompanied By: transportation Schedule Follow-up Appointment: Yes Clinical Summary of Care: Raymond [name], Raymond [name] ([id number]) [id number] Page 2 of 2 Notes Patient has an HBO treatment scheduled on 04/19/22 at 08:00 am. Electronic Signature(s) Signed: 04/18/2022 12:46:47 PM By: [name] RCP, RRT CHT , , Entered By: [name] on 04/18/2022 10:52:04 , -------------------------------------------------------------------------------- Vitals Details Patient Name: Date of Service: Raymond [name], Raymond Negus D. 04/18/2022 8:00 A M Medical Record Number: [id number] Patient Account Number: [id number] Date of Birth/Sex: Treating RN: 06-10-1965 (57 y.o. [name], [name] Primary Care Jamarii Banks: [name] Other Clinician: [name] Referring Winton Offord: Treating Mikenna Bunkley/Extender: [name] Weeks in Treatment: 15 Vital Signs Time Taken: 07:49 Temperature (F): 98.7 Height (in): 74 Pulse (bpm): 72 Weight (lbs): 371 Respiratory Rate (breaths/min): 18 Body Mass Index (BMI): 47.6 Blood Pressure (mmHg): 120/64 Capillary Blood Glucose (mg/dl): 234 Reference Range: 80 - 120 mg / dl Electronic Signature(s) Signed: 04/18/2022 12:46:47 PM By: [name] RCP, RRT CHT , , Entered By: [name] on 04/18/2022 09:19:50 ,

## 2022-04-19 ENCOUNTER — Encounter: Payer: Medicare HMO | Admitting: Physician Assistant

## 2022-04-19 DIAGNOSIS — M86372 Chronic multifocal osteomyelitis, left ankle and foot: Secondary | ICD-10-CM | POA: Diagnosis not present

## 2022-04-19 LAB — GLUCOSE, CAPILLARY
Glucose-Capillary: 255 mg/dL — ABNORMAL HIGH (ref 70–99)
Glucose-Capillary: 256 mg/dL — ABNORMAL HIGH (ref 70–99)

## 2022-04-19 NOTE — Progress Notes (Signed)
DOT, HENDEL ([id number]) [id number] Page 1 of 2 Visit Report for 04/18/2022 HBO Details Patient Name: Date of Service: Raymond Eke D. 04/18/2022 8:00 A M Medical Record Number: [id number] Patient Account Number: [id number] Date of Birth/Sex: Treating RN: 09/22/1965 (57 y.o. 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 HBO Treatment Details Treatment Number: 34 Patient Type: Outpatient Chamber Type: Monoplace Chamber Serial #: [id number] Treatment Protocol: 2.0 ATA with 90 minutes oxygen, and no air breaks Treatment Details Compression Rate Down: 1.5 psi / minute De-Compression Rate Up: 1.5 psi / minute Air breaks and breathing Decompress Decompress Compress Tx Pressure Begins Reached periods Begins Ends (leave unused spaces blank) Chamber Pressure (ATA 1 2 ------2 1 ) Clock Time (24 hr) 08:10 08:21 - - - - - - 09:51 10:02 Treatment Length: 112 (minutes) Treatment Segments: 4 Vital Signs Capillary Blood Glucose Reference Range: 80 - 120 mg / dl HBO Diabetic Blood Glucose Intervention Range: <131 mg/dl or >249 mg/dl Type: Time Vitals Blood Respiratory Capillary Blood Glucose Pulse Action Pulse: Temperature: Taken: Pressure: Rate: Glucose (mg/dl): Meter #: Oximetry (%) Taken: Pre 07:49 120/64 72 18 98.7 234 1 none per protocol Post 10:21 120/60 72 18 98.4 Treatment Response Treatment Toleration: Well Treatment Completion Status: Treatment Completed without Adverse Event Electronic Signature(s) Signed: 04/18/2022 12:46:47 PM By: [name] RCP, RRT CHT ,  , Signed: 04/18/2022 5:58:33 PM By: [name] PA-C Entered By: [name] on 04/18/2022 10:50:03 , [name] ([id number]) [id number] Page 2 of 2 -------------------------------------------------------------------------------- HBO Safety Checklist Details Patient Name: Date of Service: Raymond [name]. 04/18/2022 8:00 A M Medical Record Number: [id number] Patient Account Number: [id number] Date of Birth/Sex: Treating RN: Sep 06, 1965 (57 y.o. [name], [name] Primary Care Pape Parson: [name] Other Clinician: [name] Referring Koi Zangara: Treating Aceyn Kathol/Extender: [name] Weeks in Treatment: 15 HBO Safety Checklist Items Safety Checklist Consent Form Signed Patient voided / foley secured and emptied When did you last eato 04/18/22 am Last dose of injectable or oral agent 04/18/22 am metformin Ostomy pouch emptied and vented if applicable NA All implantable devices assessed, documented and approved NA Intravenous access site secured and place NA Valuables secured Linens and cotton and cotton/polyester blend (less than 51% polyester) Personal oil-based products / skin lotions / body lotions removed Wigs or hairpieces removed NA Smoking or tobacco materials removed NA Books / newspapers / magazines / loose paper removed NA Cologne, aftershave, perfume and deodorant removed Jewelry removed (may wrap wedding band) Make-up removed NA Hair care products removed Battery operated devices (external) removed NA Heating patches and chemical warmers removed NA Titanium eyewear removed NA Nail polish cured greater than 10 hours NA Casting material cured greater than 10 hours NA Hearing aids removed NA Loose dentures or partials removed NA Prosthetics have been removed NA Patient demonstrates correct use of air break device (if applicable) Patient concerns have been addressed Patient  grounding bracelet on and cord attached to chamber Specifics for Inpatients (complete in addition to above) Medication sheet sent with patient Intravenous medications needed or due during therapy sent with patient Drainage tubes (e.g. nasogastric tube or chest tube secured and vented) Endotracheal or Tracheotomy tube secured  Cuff deflated of air and inflated with saline Airway suctioned Electronic Signature(s) Signed: 04/18/2022 12:46:47 PM By: [name] RCP, RRT CHT , , Entered By: [name] on 04/18/2022 09:21:09 ,

## 2022-04-20 ENCOUNTER — Encounter (HOSPITAL_BASED_OUTPATIENT_CLINIC_OR_DEPARTMENT_OTHER): Payer: Medicare HMO | Admitting: Internal Medicine

## 2022-04-20 DIAGNOSIS — E11621 Type 2 diabetes mellitus with foot ulcer: Secondary | ICD-10-CM | POA: Diagnosis not present

## 2022-04-20 DIAGNOSIS — M86372 Chronic multifocal osteomyelitis, left ankle and foot: Secondary | ICD-10-CM

## 2022-04-20 DIAGNOSIS — I5042 Chronic combined systolic (congestive) and diastolic (congestive) heart failure: Secondary | ICD-10-CM

## 2022-04-20 DIAGNOSIS — L97522 Non-pressure chronic ulcer of other part of left foot with fat layer exposed: Secondary | ICD-10-CM

## 2022-04-20 LAB — GLUCOSE, CAPILLARY
Glucose-Capillary: 250 mg/dL — ABNORMAL HIGH (ref 70–99)
Glucose-Capillary: 246 mg/dL — ABNORMAL HIGH (ref 70–99)

## 2022-04-20 NOTE — Progress Notes (Signed)
Raymond, [name] ([id number]) [id number] Page 1 of 2 Visit Report for 04/19/2022 Arrival Information Details Patient Name: Date of Service: Raymond [name]. 04/19/2022 8:00 A M Medical Record Number: [id number] Patient Account Number: [id number] Date of Birth/Sex: Treating RN: 1966/01/09 (57 y.o. Raymond [name], Raymond [name] Primary Care Shanan Mcmiller: [name] Other Clinician: [name] Referring Micayla Brathwaite: Treating Robley Matassa/Extender: Hazle Coca in Treatment: 15 Visit Information History Since Last Visit Added or deleted any medications: No Patient Arrived: Ambulatory Any new allergies or adverse reactions: No Arrival Time: 07:30 Had a fall or experienced change in No Accompanied By: transportation activities of daily living that may affect Transfer Assistance: None risk of falls: Patient Identification Verified: Yes Signs or symptoms of abuse/neglect since last visito No Secondary Verification Process Completed: Yes Hospitalized since last visit: No Patient Requires Transmission-Based Precautions: No Implantable device outside of the clinic excluding No Patient Has Alerts: Yes cellular tissue based products placed in the center Patient Alerts: Type II Diabetic since last visit: Aspirin 81mg  Pain Present Now: No ABI L [place]>220 Electronic Signature(s) Signed: 04/19/2022 1:28:10 PM By: [name] RCP, RRT CHT , , Entered By: [name] on 04/19/2022 08:35:12 , -------------------------------------------------------------------------------- Encounter Discharge Information Details Patient Name: Date of Service: Raymond [name], Elder Raymond D. 04/19/2022 8:00 A M Medical Record Number: [id number] Patient Account Number: [id number] Date of Birth/Sex: Treating RN: 15-Jul-1965 (57 y.o. Raymond [name], Raymond [name] Primary Care Ollen Rao: [name] Other Clinician: [name] Referring Michaelann Gunnoe: Treating  Tamber Burtch/Extender: Hazle Coca in Treatment: 15 Encounter Discharge Information Items Discharge Condition: Stable Ambulatory Status: Ambulatory Discharge Destination: Home Transportation: Other Accompanied By: transportation Schedule Follow-up Appointment: Yes Clinical Summary of Care: PARKS, MATTALIANO ([id number]) [id number] Page 2 of 2 Notes Patient has an HBO treatment scheduled on 04/20/22 at 08:00 am. Electronic Signature(s) Signed: 04/19/2022 1:28:10 PM By: [name] RCP, RRT CHT , , Entered By: [name] on 04/19/2022 11:12:06 , -------------------------------------------------------------------------------- Vitals Details Patient Name: Date of Service: Raymond [name], Elder Raymond D. 04/19/2022 8:00 A M Medical Record Number: [id number] Patient Account Number: [id number] Date of Birth/Sex: Treating RN: [birth year] (57 y.o. Raymond [name], Raymond [name] Primary Care Raymond [name]: [name] Other Clinician: [name] Referring Dorette Hartel: Treating Koral Thaden/Extender: [name] Weeks in Treatment: 15 Vital Signs Time Taken: 07:35 Temperature (F): 98.3 Height (in): 74 Pulse (bpm): 84 Weight (lbs): 371 Respiratory Rate (breaths/min): 18 Body Mass Index (BMI): 47.6 Blood Pressure (mmHg): 140/80 Capillary Blood Glucose (mg/dl): 255 Reference Range: 80 - 120 mg / dl Airway Pulse Oximetry (%): 98 Electronic Signature(s) Signed: 04/19/2022 1:28:10 PM By: [name] RCP, RRT CHT , , Entered By: [name] on 04/19/2022 08:36:09 ,

## 2022-04-20 NOTE — Progress Notes (Signed)
FAIN, Raymond [name] ([id number]) [id number] Page 1 of 2 Visit Report for 04/19/2022 HBO Details Patient Name: Date of Service: Raymond Eke D. 04/19/2022 8:00 A M Medical Record Number: [id number] Patient Account Number: [id number] Date of Birth/Sex: Treating RN: 1965/12/29 (57 y.o. 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 HBO Treatment Details Treatment Number: 35 Patient Type: Outpatient Chamber Type: Monoplace Chamber Serial #: [id number] Treatment Protocol: 2.0 ATA with 90 minutes oxygen, and no air breaks Treatment Details Compression Rate Down: 1.5 psi / minute De-Compression Rate Up: 1.5 psi / minute Air breaks and breathing Decompress Decompress Compress Tx Pressure Begins Reached periods Begins Ends (leave unused spaces blank) Chamber Pressure (ATA 1 2 ------2 1 ) Clock Time (24 hr) 08:09 08:21 - - - - - - 09:51 10:03 Treatment Length: 114 (minutes) Treatment Segments: 4 Vital Signs Capillary Blood Glucose Reference Range: 80 - 120 mg / dl HBO Diabetic Blood Glucose Intervention Range: <131 mg/dl or >249 mg/dl Type: Time Vitals Blood Respiratory Capillary Blood Glucose Pulse Action Pulse: Temperature: Taken: Pressure: Rate: Glucose (mg/dl): Meter #: Oximetry (%) Taken: Pre 07:35 140/80 84 18 98.3 255 1 98 none per protocol Post 10:03 118/60 60 18 98.4 256 1 none per protocol Treatment Response Treatment Toleration: Well Treatment Completion Status: Treatment Completed without Adverse Event Electronic Signature(s) Signed: 04/19/2022 1:28:10 PM By: [name] RCP, RRT CHT , , Signed: 04/19/2022 6:19:02 PM By: [name] PA-C Entered By: [name] on 04/19/2022 11:07:13 , [name] ([id number]) [id number] Page 2 of 2 -------------------------------------------------------------------------------- HBO Safety Checklist Details Patient Name: Date of Service: Raymond Ponto. 04/19/2022 8:00 A M Medical Record Number: [id number] Patient Account Number: [id number] Date of Birth/Sex: Treating RN: 23-Sep-1965 (57 y.o. [name], [name] Primary Care Tiberius Loftus: [name] Other Clinician: [name] Referring Myranda Pavone: Treating Muriah Harsha/Extender: [name] Weeks in Treatment: 15 HBO Safety Checklist Items Safety Checklist Consent Form Signed Patient voided / foley secured and emptied When did you last eato 3/19/2024am Last dose of injectable or oral agent 3/19/2024am metformin Ostomy pouch emptied and vented if applicable NA All implantable devices assessed, documented and approved NA Intravenous access site secured and place NA Valuables secured Linens and cotton and cotton/polyester blend (less than 51% polyester) Personal oil-based products / skin lotions / body lotions removed Wigs or hairpieces removed NA Smoking or tobacco materials removed NA Books / newspapers / magazines / loose paper removed NA Cologne, aftershave, perfume and deodorant removed Jewelry removed (may wrap wedding band) Make-up removed NA Hair care products removed Battery operated devices (external) removed NA Heating patches and chemical warmers removed NA Titanium eyewear removed NA Nail polish cured greater than 10 hours NA Casting material cured greater than 10 hours NA Hearing aids removed NA Loose dentures or partials removed NA Prosthetics have been removed NA Patient demonstrates correct use of air break device (if applicable) Patient concerns  have been addressed Patient grounding bracelet on and cord attached to chamber Specifics for Inpatients (complete in addition to above) Medication sheet sent with patient Intravenous medications needed or due during therapy sent with patient Drainage tubes (e.g. nasogastric tube or chest tube secured and vented) Endotracheal  or Tracheotomy tube secured Cuff deflated of air and inflated with saline Airway suctioned Electronic Signature(s) Signed: 04/19/2022 1:28:10 PM By: [name] RCP, RRT CHT , , Entered By: [name] on 04/19/2022 08:39:44 ,

## 2022-04-21 ENCOUNTER — Encounter: Payer: Medicare HMO | Admitting: Physician Assistant

## 2022-04-21 DIAGNOSIS — M86372 Chronic multifocal osteomyelitis, left ankle and foot: Secondary | ICD-10-CM | POA: Diagnosis not present

## 2022-04-21 LAB — GLUCOSE, CAPILLARY
Glucose-Capillary: 172 mg/dL — ABNORMAL HIGH (ref 70–99)
Glucose-Capillary: 169 mg/dL — ABNORMAL HIGH (ref 70–99)

## 2022-04-21 NOTE — Progress Notes (Signed)
Raymond [name] ([id number]) [id number] Page 1 of 2 Visit Report for 04/20/2022 HBO Details Patient Name: Date of Service: Raymond [name]. 04/20/2022 8:00 A M Medical Record Number: [id number] Patient Account Number: [id number] Date of Birth/Sex: Treating RN: 11-17-1965 (57 y.o. 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 HBO Treatment Details Treatment Number: 36 Patient Type: Outpatient Chamber Type: Monoplace Chamber Serial #: [id number] Treatment Protocol: 2.0 ATA with 90 minutes oxygen, and no air breaks Treatment Details Compression Rate Down: 1.0 psi / minute De-Compression Rate Up: Air breaks and breathing Decompress Decompress Compress Tx Pressure Begins Reached periods Begins Ends (leave unused spaces blank) Chamber Pressure (ATA 1 2 ------2 1 ) Clock Time (24 hr) 08:37 08:49 - - - - - - 10:20 10:27 Treatment Length: 110 (minutes) Treatment Segments: 4 Vital Signs Capillary Blood Glucose Reference Range: 80 - 120 mg / dl HBO Diabetic Blood Glucose Intervention Range: <131 mg/dl or >249 mg/dl Type: Time Vitals Blood Respiratory Capillary Blood Glucose Pulse Action Pulse: Temperature: Taken: Pressure: Rate: Glucose (mg/dl): Meter #: Oximetry (%) Taken: Pre 07:50 138/68 77 18 98.4 250 1 100 none per protocol Post 10:27 122/60 72 18 98.4 246 1 100 none per protocol Treatment Response Treatment Toleration: Well Treatment Completion Status: Treatment Completed without Adverse Event HBO Attestation I certify that I supervised this HBO treatment in accordance  with Medicare guidelines. A trained emergency response team is readily available per Yes hospital policies and procedures. Continue HBOT as ordered. Yes Electronic Signature(s) Signed: 04/20/2022 2:39:37 PM By: Raymond Shan DO Previous Signature: 04/20/2022 2:05:06 PM Version By: Raymond [name] RCP, RRT CHT , , Previous Signature: 04/20/2022 2:38:02 PM Version By: Raymond [name] ([id number]) [id number] Page 2 of 2 Entered By: Raymond [name] on 04/20/2022 14:38:24 -------------------------------------------------------------------------------- HBO Safety Checklist Details Patient Name: Date of Service: Raymond [name] 04/20/2022 8:00 A M Medical Record Number: [id number] Patient Account Number: [id number] Date of Birth/Sex: Treating RN: 03/07/1965 (57 y.o. Raymond [name], Raymond [name] Primary Care Raymond [name]: Raymond [name] Other Clinician: Jacqulyn [name] Referring Raymond [name]: Treating Raymond [name]/Extender: Raymond [name] in Treatment: 15 HBO Safety Checklist Items Safety Checklist Consent Form Signed Patient voided / foley secured and emptied When did you last eato 04/20/2022 am Last dose of injectable or oral agent 04/20/2022 metformin Ostomy pouch emptied and vented if applicable NA All implantable devices assessed, documented and approved NA Intravenous access site secured and place NA Valuables secured Linens and cotton and cotton/polyester blend (less than 51% polyester) Personal oil-based products / skin lotions / body lotions removed Wigs or hairpieces removed NA Smoking or tobacco materials removed NA Books / newspapers / magazines / loose paper removed NA Cologne, aftershave, perfume and deodorant removed Jewelry removed (may wrap wedding band) Make-up removed NA Hair care products removed Battery operated devices (external) removed NA Heating patches and chemical warmers  removed NA Titanium eyewear removed NA Nail polish cured greater than 10 hours NA Casting material cured greater than 10 hours NA Hearing aids removed NA Loose dentures or partials removed NA Prosthetics have been removed NA Patient demonstrates correct use of air break device (if applicable) Patient concerns have been addressed Patient grounding bracelet  on and cord attached to chamber Specifics for Inpatients (complete in addition to above) Medication sheet sent with patient Intravenous medications needed or due during therapy sent with patient Drainage tubes (e.g. nasogastric tube or chest tube secured and vented) Endotracheal or Tracheotomy tube secured Cuff deflated of air and inflated with saline Airway suctioned Electronic Signature(s) Signed: 04/20/2022 2:05:06 PM By: Raymond [name] RCP, RRT CHT , , Entered By: Raymond [name] on 04/20/2022 11:11:55 ,

## 2022-04-21 NOTE — Progress Notes (Signed)
Raymond, [name] ([id number]) [id number].pdf Page 1 of 2 Visit Report for 04/20/2022 Arrival Information Details Patient Name: Date of Service: Raymond [name]. 04/20/2022 8:00 A M Medical Record Number: [id number] Patient Account Number: [id number] Date of Birth/Sex: Treating RN: [birth year] (57 y.o. Raymond [name], Raymond [name] Primary Care Raymond [name]: Raymond [name] Other Clinician: Jacqulyn [name] Referring Raymond [name]: Treating Raymond [name]/Extender: Raymond [name] in Treatment: 15 Visit Information History Since Last Visit Added or deleted any medications: No Patient Arrived: Ambulatory Any new allergies or adverse reactions: No Arrival Time: 07:45 Had a fall or experienced change in No Accompanied By: self activities of daily living that may affect Transfer Assistance: None risk of falls: Patient Identification Verified: Yes Signs or symptoms of abuse/neglect since last visito No Secondary Verification Process Completed: Yes Hospitalized since last visit: No Patient Requires Transmission-Based Precautions: No Implantable device outside of the clinic excluding No Patient Has Alerts: Yes cellular tissue based products placed in the center Patient Alerts: Type II Diabetic since last visit: Aspirin 81mg  Pain Present Now: No ABI L [place]>220 Electronic Signature(s) Signed: 04/20/2022 2:05:06 PM By: Raymond [name] RCP, RRT CHT , , Entered By: Raymond [name] on 04/20/2022 09:03:35 , -------------------------------------------------------------------------------- Encounter Discharge Information Details Patient Name: Date of Service: Raymond [name], Raymond Negus D. 04/20/2022 8:00 A M Medical Record Number: [id number] Patient Account Number: [id number] Date of Birth/Sex: Treating RN: 10/31/1965 (57 y.o. Raymond [name], Raymond [name] Primary Care Semiah Konczal: Raymond [name] Other Clinician: Jacqulyn [name] Referring Raymond [name]: Treating  Raymond [name]/Extender: Raymond [name] in Treatment: 15 Encounter Discharge Information Items Discharge Condition: Stable Ambulatory Status: Ambulatory Discharge Destination: Home Transportation: Private Auto Accompanied By: self Schedule Follow-up Appointment: Yes Clinical Summary of Care: [name], [name] ([id number]) [id number] Page 2 of 2 Electronic Signature(s) Signed: 04/20/2022 2:05:06 PM By: Raymond [name] RCP, RRT CHT , , Entered By: Raymond [name] on 04/20/2022 12:22:04 , -------------------------------------------------------------------------------- Vitals Details Patient Name: Date of Service: Raymond [name], Raymond Negus D. 04/20/2022 8:00 A M Medical Record Number: [id number] Patient Account Number: [id number] Date of Birth/Sex: Treating RN: April 02, 1965 (57 y.o. Raymond [name], Raymond [name] Primary Care Raymond [name]: Raymond [name] Other Clinician: Jacqulyn [name] Referring Raymond [name]: Treating Raymond [name]/Extender: Raymond [name] in Treatment: 15 Vital Signs Time Taken: 07:50 Temperature (F): 98.4 Height (in): 74 Pulse (bpm): 77 Weight (lbs): 371 Respiratory Rate (breaths/min): 18 Body Mass Index (BMI): 47.6 Blood Pressure (mmHg): 138/68 Capillary Blood Glucose (mg/dl): 250 Reference Range: 80 - 120 mg / dl Airway Pulse Oximetry (%): 100 Electronic Signature(s) Signed: 04/20/2022 2:05:06 PM By: Raymond [name] RCP, RRT CHT , , Entered By: Raymond [name] on 04/20/2022 09:04:18 ,

## 2022-04-22 ENCOUNTER — Encounter: Payer: Medicare HMO | Admitting: Physician Assistant

## 2022-04-22 DIAGNOSIS — M86372 Chronic multifocal osteomyelitis, left ankle and foot: Secondary | ICD-10-CM | POA: Diagnosis not present

## 2022-04-22 LAB — GLUCOSE, CAPILLARY
Glucose-Capillary: 189 mg/dL — ABNORMAL HIGH (ref 70–99)
Glucose-Capillary: 165 mg/dL — ABNORMAL HIGH (ref 70–99)

## 2022-04-22 NOTE — Progress Notes (Signed)
[facility], [facility] ([id number]) [id number] Page 1 of 2 Visit Report for 04/21/2022 Arrival Information Details Patient Name: Date of Service: [name]. 04/21/2022 8:00 A M Medical Record Number: [id number] Patient Account Number: [id number] Date of Birth/Sex: Treating RN: [birth year] (56 y.o. [name], [name] Primary Care Claudy Abdallah: [name] Other Clinician: [name] Referring [place] Mer[place]: Treating Zymarion Favorite/Extender: Hazle Coca in Treatment: 15 Visit Information History Since Last Visit Added or deleted any medications: No Patient Arrived: Ambulatory Any new allergies or adverse reactions: No Arrival Time: 08:20 Had a fall or experienced change in No Accompanied By: self activities of daily living that may affect Transfer Assistance: None risk of falls: Patient Identification Verified: Yes Signs or symptoms of abuse/neglect since last visito No Secondary Verification Process Completed: Yes Hospitalized since last visit: No Patient Requires Transmission-Based Precautions: No Implantable device outside of the clinic excluding No Patient Has Alerts: Yes cellular tissue based products placed in the center Patient Alerts: Type II Diabetic since last visit: Aspirin 81mg  Pain Present Now: No ABI L [place]>220 Electronic Signature(s) Signed: 04/21/2022 1:46:20 PM By: [name] RCP, RRT CHT , , Entered By: [name] on 04/21/2022 11:06:38 , -------------------------------------------------------------------------------- Encounter Discharge Information Details Patient Name: Date of Service: [name], [name] 04/21/2022 8:00 A M Medical Record Number: [id number] Patient Account Number: [id number] Date of Birth/Sex: Treating RN: December 03, 1965 (57 y.o. [name], [name] Primary Care Anuradha Chabot: [name] Other Clinician: [name] Referring Shealeigh Dunstan: Treating [place] Churchill/Extender:  Hazle Coca in Treatment: 15 Encounter Discharge Information Items Discharge Condition: Stable Ambulatory Status: Ambulatory Discharge Destination: Home Transportation: Private Auto Accompanied By: self Schedule Follow-up Appointment: Yes Clinical Summary of Care: HAMIN, MONTOUR ([id number]) [id number] Page 2 of 2 Electronic Signature(s) Signed: 04/21/2022 1:46:20 PM By: [name] RCP, RRT CHT , , Entered By: [name] on 04/21/2022 11:11:04 , -------------------------------------------------------------------------------- Vitals Details Patient Name: Date of Service: [name], [name] 04/21/2022 8:00 A M Medical Record Number: [id number] Patient Account Number: [id number] Date of Birth/Sex: Treating RN: 07-11-1965 (57 y.o. [name], [name] Primary Care Katrianna Friesenhahn: [name] Other Clinician: [name] Referring Aryka Coonradt: Treating Jnaya Butrick/Extender: [name] Weeks in Treatment: 15 Vital Signs Time Taken: 08:12 Temperature (F): 98.0 Height (in): 74 Pulse (bpm): 80 Weight (lbs): 371 Respiratory Rate (breaths/min): 18 Body Mass Index (BMI): 47.6 Blood Pressure (mmHg): 140/78 Capillary Blood Glucose (mg/dl): 172 Reference Range: 80 - 120 mg / dl Airway Pulse Oximetry (%): 100 Electronic Signature(s) Signed: 04/21/2022 1:46:20 PM By: [name] RCP, RRT CHT , , Entered By: [name] on 04/21/2022 11:06:45 ,

## 2022-04-22 NOTE — Progress Notes (Addendum)
RANNON, ECONOMOS ([id number]) [id number] Page 1 of 8 Visit Report for 04/21/2022 Chief Complaint Document Details Patient Name: Date of Service: Raymond [name] 04/21/2022 10:45 A M Medical Record Number: [id number] Patient Account Number: [id number] Date of Birth/Sex: Treating RN: [birth year] (57 y.o. Raymond [name] Primary Care Provider: [name] Other Clinician: Massie Kluver Referring Provider: Treating Provider/Extender: Hazle Coca in Treatment: 15 Information Obtained from: Patient Chief Complaint Left foot ulcer with chronic osteomyelitis Electronic Signature(s) Signed: 04/21/2022 10:43:29 AM By: [name] PA-C Entered By: [name] on 04/21/2022 10:43:29 -------------------------------------------------------------------------------- HPI Details Patient Name: Date of Service: Raymond Eke D. 04/21/2022 10:45 A M Medical Record Number: [id number] Patient Account Number: [id number] Date of Birth/Sex: Treating RN: 03/03/1965 (57 y.o. Raymond [name] Primary Care Provider: [name] Other Clinician: Massie Kluver Referring Provider: Treating Provider/Extender: [name] Weeks in Treatment: 15 History of Present Illness HPI Description: 09/05/17-He is seeing an initial evaluation for a left plantar foot ulcer. He has a remote history of left great toe amputation. He states that 4-6 weeks ago he noted callus formation and ulceration. He has not seen primary care regarding this. He is not currently on antibiotic therapy. He does not routinely follow with podiatry. He states diabetic foot wear will arrive early next week. The EHR shows an A1c of 9% approximate 4 months ago but he states he had one and primary care a few weeks ago but does not know the results. He is neuropathic and does not complain of any pain, he is currently wearing crocs. 09/12/17-he is here in follow  up evaluation for left plantar foot ulcer. There is improvement in both appearance and measurement. We will continue with same treatment plan and he will follow up next week. 09/19/17 on evaluation today patient actually appears to be doing excellent in regard to his ulcer on the invitation site of his left great foot plantar aspect. He's been tolerating the dressing changes without complication. In fact with the Prisma and the current measures he has been shown signs of excellent improvement week by week up to this point. We have been to breeding the wound in this seems to have been of great benefit for him. Fortunately there is no evidence of infection. 09/26/17 on evaluation today patient appears to be doing rather well in regard to his wound. He did not know quite as much improvement this week as compared to last week. Nonetheless he still continues to show signs of improving to some degree. I do believe he may benefit from an offloading shoe. No fevers, chills, nausea, or vomiting noted at this time. Raymond [name], Raymond [name] ([id number]) [id number] Page 2 of 8 10/03/17 on evaluation today patient actually appears to be doing much better in regard to the amputation site plantar foot ulcer. Overall this appears significantly smaller even compared to previous. He's been tolerating the dressing changes without complication. He did get his diabetic shoes and I did have a look at them they appear to be fairly good. Nonetheless I do think that for the time being I would probably recommend he continue with the offloading shoe that we have been utilizing just due to the fact that with the dressing I don't know that his diabetic shoes are going to work as appropriately as far as not called an additional pressure and irritation to the area in question. He understands. 10/10/17 on evaluation today patient actually appears to be doing very well  in regard to his plantar foot ulcer. He has  been tolerating the dressing changes without complication. I do feel like he's making signs of good improvement and in fact of the wound bed appears to be better although it may not be significantly changed in size it appears healthier and I do believe is showing signs of improving. Nonetheless we gonna keep working towards healing as far as that is concerned. There is no evidence of infection. 10/17/17 on evaluation today patient actually appears to be doing poorly in regard to his plantar foot ulcer. Unfortunately other than just the area where the wound is itself there appears to be a blister that communicates with the wound unfortunately. This is more lateral to the wound itself and also to the distal point of the amputation site. There does not appear to be any evidence of cellulitis spreading at the foot but I do believe some of the drainage from the site itself is. When in nature. Nonetheless this blister area I think needs to be removed in order to allow for appropriate healing of the ulcer itself I think that is gonna be difficult for it to heal otherwise. This was discussed with the patient today. 10/24/17 on evaluation today patient actually appears to be doing better compared to last week even post debridement. He has shown signs of improvement he did test positive for Staphylococcus aureus. With that being said he notes that he's not have any discomfort and in general he does feel like things seem to been doing better. Fortunately there's no additional blistering and no evidence of remaining infection at this time. No fevers, chills, nausea, or vomiting noted at this time. He has three days of antibiotic left. 10/31/17 on evaluation today patient actually appears to be doing very well in regard to his ulcer on the left foot. He has been tolerating the dressing changes without complication. With that being said fortunately there appears to be no evidence of infection I do think he's made  progress compared to previous. No fevers, chills, nausea, or vomiting noted at this time. 11/14/17 on evaluation today patient actually appears to be doing excellent in regard to his amputation site ulcer on his foot. In fact this appears to be completely healed at this point. There is no evidence of infection nor underline abscess and I did thoroughly evaluate the periwound location. Overall I'm very pleased with how things appear. Readmission: 01-03-2022 upon evaluation today patient appears for reevaluation here in the clinic although it has been since 2019 that I last saw him. This again has been over 4 years. With that being said he is having an issue with a wound on his foot unfortunately. He has not had any recent x-rays he tells me at this point which is unfortunate as avoid definitely can need to get something started that can be one of the primary items to get moving forward with. With that being said I also think were probably can obtain a wound culture he is probably going to require some antibiotics at some point. I just want to make sure that we have him on the right thing when we do this. Currently the patient tells me that his wounds have been present in regard to the met head for about a month at this point. With that being said he also has a wound on the third toe which is the next toe this actually still present. This unfortunately has a lot of callus buildup around it but I  think it is larger underneath than what it would appear on initial inspection. Patient does have a history of diabetes mellitus type 2, congestive heart failure, and COPD. 01-11-2022 upon evaluation patient's wounds actually are showing signs of doing about the same may be just slightly cleaner but definitely not where we want things to be as of yet. Fortunately there does not appear to be any signs of active infection locally nor systemically at this point which is great news. No fevers, chills, nausea,  vomiting, or diarrhea. Of note patient has had his MRI but we do not have the results of that as of yet we are still waiting for the reading on this at this point. 01-18-2022 upon evaluation today patient presents after having had his MRI finally complete. 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 The third and fourth digits seem to be early which is good news. Nonetheless we are still going to need to try to see about getting the infection under control he is already been on the antibiotic therapy which I think has done quite well. In fact I feel like he is showing signs of improvement already. 12/28; patient with a wound on the medial aspect of the left first metatarsal head and an area on the left third toe with slight hammer deformity. He has had a previous left first toe amputation. We are using Iodoflex on the wound. He is a diabetic a recent MRI showed osteomyelitis we have been giving him Cipro and Doxy which he appears to be tolerating well. 02-10-2022 upon evaluation today patient unfortunately has issues still with open wounds of his foot on the left. He does have known osteomyelitis at this point and that is something that we have been treating with oral antibiotics. I actually have not seen him personally since 19 December. In that time he has not had any debridement from that point until today based on what I can see as best I can tell anyway. Has been on Cipro as well as doxycycline. Nonetheless he does still seem to have open wounds today and I think that he would benefit from hyperbaric oxygen therapy. I discussed that with him today as well. 02-18-2022 upon evaluation today patient appears to be doing about the same in regard to his wounds. Fortunately I do not see any signs of infection although he still has areas that seem to initially close down but that he has a lot of callus that still open at both locations most  on the metatarsal region as well as the toe. I think that he is appropriate for proceeding with the hyperbarics and I think the sooner we get this done to try to get these wounds healed the better off he will be. 02-25-2022 upon evaluation today patient's wounds do show signs of being dry get again. We were attempting to pack and some of the alginate dressing instead of doing the collagen as everything was getting very dry but nonetheless this is still continue to be an ongoing issue. My suggestion based on what I am seeing is good to be that we actually switch to doing Xeroform which should hopefully keep this a little bit more moist and from hopefully closing up prematurely. The patient voiced understanding. 03-03-2022 upon evaluation today patient appears to be doing well with regard to his wounds things as before appear to be getting dry. We are using Xeroform to try to keep it  open is much as possible but each time I see him he does tend to cover over the good news is each time he also seems to be getting a little bit smaller which is excellent. Fortunately I do not see any evidence of active infection locally nor systemically which is great news. I think this means that between the antibiotics and the hyperbarics were really on a good track here. 03-10-2022 upon evaluation today patient appears to be doing well currently in regard to his wounds. He is actually showing signs of some good improvement he is also tolerating HBO therapy very well. I do not see any evidence of active infection locally nor systemically which is great news and overall I am extremely pleased in that regard. No fevers, chills, nausea, vomiting, or diarrhea. 03-17-2022 upon evaluation today patient appears to be doing well currently in regard to his wounds in fact the toe is completely healed. The metatarsal head location though not completely healed appears to be doing much better which is great news and overall I am extremely  pleased with where we stand today. 03-24-2022 upon evaluation today patient appears to be doing well currently in regard to his wounds. He has been tolerating the dressing changes without complication. Fortunately there does not appear to be any signs of active infection locally nor systemically in fact I am not so sure this wound is very close to being healed. I did perform some debridement to clearway some of the callus and the patient tolerated this today without complication. Postdebridement the wound bed is significantly improved. 03-31-2022 upon evaluation today patient appears to be doing well currently in regard to his wound which is actually showing signs of being completely healed. This is great news. Fortunately I do believe that the patient is doing much better he still on the oral antibiotics which I think is helping to treat the osteomyelitis is also undergoing hyperbaric oxygen therapy which I think is doing a really good job here as well. Raymond [name], Raymond [name] ([id number]) [id number] Page 3 of 8 04-21-2022 upon evaluation today patient continues to remain completely closed. He seems to be doing excellent and is very close to completion of his 40 treatments of HBO therapy which that is the point where recommend discontinue therapy at that point. Fortunately I do not see any evidence of active infection locally nor systemically at this time which is great news. Electronic Signature(s) Signed: 04/21/2022 4:33:38 PM By: [name] PA-C Entered By: [name] on 04/21/2022 16:33:38 -------------------------------------------------------------------------------- Physical Exam Details Patient Name: Date of Service: Raymond Eke D. 04/21/2022 10:45 A M Medical Record Number: [id number] Patient Account Number: [id number] Date of Birth/Sex: Treating RN: 1965/04/04 (57 y.o. Raymond [name] Primary Care Provider: [name] Other Clinician:  Massie Kluver Referring Provider: Treating Provider/Extender: [name] Weeks in Treatment: 48 Constitutional Well-nourished and well-hydrated in no acute distress. Respiratory normal breathing without difficulty. Psychiatric this patient is able to make decisions and demonstrates good insight into disease process. Alert and Oriented x 3. pleasant and cooperative. Notes Upon inspection patient's wound bed actually appears to be doing excellent there does not appear to be any signs of active infection locally nor systemically which is great news and overall I am extremely pleased with where we stand today. Electronic Signature(s) Signed: 04/21/2022 4:35:21 PM By: [name] PA-C Entered By: [name] on 04/21/2022 16:35:21 -------------------------------------------------------------------------------- Physician Orders Details Patient Name: Date of Service: Raymond [name], Raymond Negus  D. 04/21/2022 10:45 A M Medical Record Number: [id number] Patient Account Number: [id number] Date of Birth/Sex: Treating RN: 09/15/1965 (57 y.o. Raymond [name], Raymond [name] Primary Care Provider: [name] Other Clinician: Massie Kluver Referring Provider: Treating Provider/Extender: Hazle Coca in Treatment: 15 Verbal / Phone Orders: Yes Clinician: Cornell [name] Read Back and Verified: Yes Diagnosis Coding Raymond [name], Raymond [name] ([id number]) [id number] Page 4 of 8 ICD-10 Coding Code Description [phone] Chronic multifocal osteomyelitis, left ankle and foot E11.621 Type 2 diabetes mellitus with foot ulcer L97.522 Non-pressure chronic ulcer of other part of left foot with fat layer exposed I50.42 Chronic combined systolic (congestive) and diastolic (congestive) heart failure J44.9 Chronic obstructive pulmonary disease, unspecified Bathing/ Shower/ Hygiene Wash wounds with antibacterial soap and water. May shower; gently cleanse wound  with antibacterial soap, rinse and pat dry prior to dressing wounds Other: - apply bandaid over area for protection Off-Loading Open toe surgical shoe Additional Orders / Instructions Other: - apply AandD after showering. Do not put on before coming to [place] - Refer to [facility] Dr. Amalia [name] for routine foot care Electronic Signature(s) Signed: 04/21/2022 4:10:18 PM By: Massie Kluver Signed: 04/21/2022 5:06:45 PM By: [name] PA-C Previous Signature: 04/21/2022 11:20:18 AM Version By: Massie Kluver Entered By: Massie Kluver on 04/21/2022 11:33:57 -------------------------------------------------------------------------------- Problem List Details Patient Name: Date of Service: Raymond Eke D. 04/21/2022 10:45 A M Medical Record Number: [id number] Patient Account Number: [id number] Date of Birth/Sex: Treating RN: [birth year] (57 y.o. Raymond [name] Primary Care Provider: [name] Other Clinician: Massie Kluver Referring Provider: Treating Provider/Extender: [name] Weeks in Treatment: 15 Active Problems ICD-10 Encounter Code Description Active Date MDM Diagnosis [phone] Chronic multifocal osteomyelitis, left ankle and foot 02/10/2022 No Yes E11.621 Type 2 diabetes mellitus with foot ulcer 01/03/2022 No Yes L97.522 Non-pressure chronic ulcer of other part of left foot with fat layer exposed 01/03/2022 No Yes I50.42 Chronic combined systolic (congestive) and diastolic (congestive) heart failure 01/03/2022 No Yes J44.9 Chronic obstructive pulmonary disease, unspecified 01/03/2022 No Yes Raymond [name], Raymond [name] ([id number]) [id number] Page 5 of 8 Inactive Problems Resolved Problems Electronic Signature(s) Signed: 04/21/2022 10:43:20 AM By: [name] PA-C Entered By: [name] on 04/21/2022  10:43:20 -------------------------------------------------------------------------------- Progress Note Details Patient Name: Date of Service: Raymond Eke D. 04/21/2022 10:45 A M Medical Record Number: [id number] Patient Account Number: [id number] Date of Birth/Sex: Treating RN: [birth year] (57 y.o. Raymond [name] Primary Care Provider: [name] Other Clinician: Massie Kluver Referring Provider: Treating Provider/Extender: Hazle Coca in Treatment: 15 Subjective Chief Complaint Information obtained from Patient Left foot ulcer with chronic osteomyelitis History of Present Illness (HPI) 09/05/17-He is seeing an initial evaluation for a left plantar foot ulcer. He has a remote history of left great toe amputation. He states that 4-6 weeks ago he noted callus formation and ulceration. He has not seen primary care regarding this. He is not currently on antibiotic therapy. He does not routinely follow with podiatry. He states diabetic foot wear will arrive early next week. The EHR shows an A1c of 9% approximate 4 months ago but he states he had one and primary care a few weeks ago but does not know the results. He is neuropathic and does not complain of any pain, he is currently wearing crocs. 09/12/17-he is here in follow up evaluation for left plantar foot ulcer. There is improvement in both appearance and measurement. We will continue with same  treatment plan and he will follow up next week. 09/19/17 on evaluation today patient actually appears to be doing excellent in regard to his ulcer on the invitation site of his left great foot plantar aspect. He's been tolerating the dressing changes without complication. In fact with the Prisma and the current measures he has been shown signs of excellent improvement week by week up to this point. We have been to breeding the wound in this seems to have been of great benefit for him. Fortunately there is no evidence  of infection. 09/26/17 on evaluation today patient appears to be doing rather well in regard to his wound. He did not know quite as much improvement this week as compared to last week. Nonetheless he still continues to show signs of improving to some degree. I do believe he may benefit from an offloading shoe. No fevers, chills, nausea, or vomiting noted at this time. 10/03/17 on evaluation today patient actually appears to be doing much better in regard to the amputation site plantar foot ulcer. Overall this appears significantly smaller even compared to previous. He's been tolerating the dressing changes without complication. He did get his diabetic shoes and I did have a look at them they appear to be fairly good. Nonetheless I do think that for the time being I would probably recommend he continue with the offloading shoe that we have been utilizing just due to the fact that with the dressing I don't know that his diabetic shoes are going to work as appropriately as far as not called an additional pressure and irritation to the area in question. He understands. 10/10/17 on evaluation today patient actually appears to be doing very well in regard to his plantar foot ulcer. He has been tolerating the dressing changes without complication. I do feel like he's making signs of good improvement and in fact of the wound bed appears to be better although it may not be significantly changed in size it appears healthier and I do believe is showing signs of improving. Nonetheless we gonna keep working towards healing as far as that is concerned. There is no evidence of infection. 10/17/17 on evaluation today patient actually appears to be doing poorly in regard to his plantar foot ulcer. Unfortunately other than just the area where the wound is itself there appears to be a blister that communicates with the wound unfortunately. This is more lateral to the wound itself and also to the distal point of the  amputation site. There does not appear to be any evidence of cellulitis spreading at the foot but I do believe some of the drainage from the site itself is. When in nature. Nonetheless this blister area I think needs to be removed in order to allow for appropriate healing of the ulcer itself I think that is gonna be difficult for it to heal otherwise. This was discussed with the patient today. 10/24/17 on evaluation today patient actually appears to be doing better compared to last week even post debridement. He has shown signs of improvement he did test positive for Staphylococcus aureus. With that being said he notes that he's not have any discomfort and in general he does feel like things seem to been doing better. Fortunately there's no additional blistering and no evidence of remaining infection at this time. No fevers, chills, nausea, or vomiting noted at this time. He has three days of antibiotic left. 10/31/17 on evaluation today patient actually appears to be doing very well in regard to his  ulcer on the left foot. He has been tolerating the dressing changes without complication. With that being said fortunately there appears to be no evidence of infection I do think he's made progress compared to previous. No Raymond [name], Raymond [name] ([id number]) [id number] Page 6 of 8 fevers, chills, nausea, or vomiting noted at this time. 11/14/17 on evaluation today patient actually appears to be doing excellent in regard to his amputation site ulcer on his foot. In fact this appears to be completely healed at this point. There is no evidence of infection nor underline abscess and I did thoroughly evaluate the periwound location. Overall I'm very pleased with how things appear. Readmission: 01-03-2022 upon evaluation today patient appears for reevaluation here in the clinic although it has been since 2019 that I last saw him. This again has been over 4 years. With that being said  he is having an issue with a wound on his foot unfortunately. He has not had any recent x-rays he tells me at this point which is unfortunate as avoid definitely can need to get something started that can be one of the primary items to get moving forward with. With that being said I also think were probably can obtain a wound culture he is probably going to require some antibiotics at some point. I just want to make sure that we have him on the right thing when we do this. Currently the patient tells me that his wounds have been present in regard to the met head for about a month at this point. With that being said he also has a wound on the third toe which is the next toe this actually still present. This unfortunately has a lot of callus buildup around it but I think it is larger underneath than what it would appear on initial inspection. Patient does have a history of diabetes mellitus type 2, congestive heart failure, and COPD. 01-11-2022 upon evaluation patient's wounds actually are showing signs of doing about the same may be just slightly cleaner but definitely not where we want things to be as of yet. Fortunately there does not appear to be any signs of active infection locally nor systemically at this point which is great news. No fevers, chills, nausea, vomiting, or diarrhea. Of note patient has had his MRI but we do not have the results of that as of yet we are still waiting for the reading on this at this point. 01-18-2022 upon evaluation today patient presents after having had his MRI finally complete. 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 The third and fourth digits seem to be early which is good news. Nonetheless we are still going to need to try to see about getting the infection under control he is already been on the antibiotic therapy which I think has done quite well. In fact I feel like he is showing signs of  improvement already. 12/28; patient with a wound on the medial aspect of the left first metatarsal head and an area on the left third toe with slight hammer deformity. He has had a previous left first toe amputation. We are using Iodoflex on the wound. He is a diabetic a recent MRI showed osteomyelitis we have been giving him Cipro and Doxy which he appears to be tolerating well. 02-10-2022 upon evaluation today patient unfortunately has issues still with open wounds of his foot on the left. He does have known osteomyelitis at this  point and that is something that we have been treating with oral antibiotics. I actually have not seen him personally since 19 December. In that time he has not had any debridement from that point until today based on what I can see as best I can tell anyway. Has been on Cipro as well as doxycycline. Nonetheless he does still seem to have open wounds today and I think that he would benefit from hyperbaric oxygen therapy. I discussed that with him today as well. 02-18-2022 upon evaluation today patient appears to be doing about the same in regard to his wounds. Fortunately I do not see any signs of infection although he still has areas that seem to initially close down but that he has a lot of callus that still open at both locations most on the metatarsal region as well as the toe. I think that he is appropriate for proceeding with the hyperbarics and I think the sooner we get this done to try to get these wounds healed the better off he will be. 02-25-2022 upon evaluation today patient's wounds do show signs of being dry get again. We were attempting to pack and some of the alginate dressing instead of doing the collagen as everything was getting very dry but nonetheless this is still continue to be an ongoing issue. My suggestion based on what I am seeing is good to be that we actually switch to doing Xeroform which should hopefully keep this a little bit more moist and from  hopefully closing up prematurely. The patient voiced understanding. 03-03-2022 upon evaluation today patient appears to be doing well with regard to his wounds things as before appear to be getting dry. We are using Xeroform to try to keep it open is much as possible but each time I see him he does tend to cover over the good news is each time he also seems to be getting a little bit smaller which is excellent. Fortunately I do not see any evidence of active infection locally nor systemically which is great news. I think this means that between the antibiotics and the hyperbarics were really on a good track here. 03-10-2022 upon evaluation today patient appears to be doing well currently in regard to his wounds. He is actually showing signs of some good improvement he is also tolerating HBO therapy very well. I do not see any evidence of active infection locally nor systemically which is great news and overall I am extremely pleased in that regard. No fevers, chills, nausea, vomiting, or diarrhea. 03-17-2022 upon evaluation today patient appears to be doing well currently in regard to his wounds in fact the toe is completely healed. The metatarsal head location though not completely healed appears to be doing much better which is great news and overall I am extremely pleased with where we stand today. 03-24-2022 upon evaluation today patient appears to be doing well currently in regard to his wounds. He has been tolerating the dressing changes without complication. Fortunately there does not appear to be any signs of active infection locally nor systemically in fact I am not so sure this wound is very close to being healed. I did perform some debridement to clearway some of the callus and the patient tolerated this today without complication. Postdebridement the wound bed is significantly improved. 03-31-2022 upon evaluation today patient appears to be doing well currently in regard to his wound which is  actually showing signs of being completely healed. This is great news. Fortunately I  do believe that the patient is doing much better he still on the oral antibiotics which I think is helping to treat the osteomyelitis is also undergoing hyperbaric oxygen therapy which I think is doing a really good job here as well. 04-21-2022 upon evaluation today patient continues to remain completely closed. He seems to be doing excellent and is very close to completion of his 40 treatments of HBO therapy which that is the point where recommend discontinue therapy at that point. Fortunately I do not see any evidence of active infection locally nor systemically at this time which is great news. Objective Constitutional Well-nourished and well-hydrated in no acute distress. Vitals Time Taken: 10:55 AM, Height: 74 in, Weight: 371 lbs, BMI: 47.6, Temperature: 98.0 F, Pulse: 80 bpm, Respiratory Rate: 18 breaths/min, Blood Pressure: 140/78 mmHg. Respiratory Raymond [name], CIAK ([id number]) [id number] Page 7 of 8 normal breathing without difficulty. Psychiatric this patient is able to make decisions and demonstrates good insight into disease process. Alert and Oriented x 3. pleasant and cooperative. General Notes: Upon inspection patient's wound bed actually appears to be doing excellent there does not appear to be any signs of active infection locally nor systemically which is great news and overall I am extremely pleased with where we stand today. 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 May shower; gently  cleanse wound with antibacterial soap, rinse and pat dry prior to dressing wounds Other: - apply bandaid over area for protection Off-Loading: Open toe surgical shoe Additional Orders / Instructions: Other: - apply AandD after showering. Do not put on before coming to HBO Consults ordered were: Podiatry - Refer to [facility] Dr. Amalia [name] for routine foot care 1. Based on what I am seeing I do believe the patient is completely healed he seems to be doing excellent and I think he when he completes his HBO therapy stands a very good chance at maintaining closure. Overall he is almost done with his oral antibiotics as well and I think everything is about to wrap up in a very good place. He is very pleased to hear this and I am hopeful that he will be able to keep this under control. 2. I am can recommend a referral to Dr. Amalia [name] at [facility] in order to have standard and ongoing footcare I think that some of this could be beneficial for him as well. We will see patient back for reevaluation in 1 week here in the clinic. If anything worsens or changes patient will contact our office for additional recommendations. I am hopeful this will be his discharge. Electronic Signature(s) Signed: 04/21/2022 4:35:54 PM By: [name] PA-C Entered By: [name] on 04/21/2022 16:35:54 -------------------------------------------------------------------------------- SuperBill Details Patient Name: Date of Service: Raymond [name], Raymond Negus D. 04/21/2022 Medical Record Number: [id number] Patient Account Number: [id number] Date of Birth/Sex: Treating RN: 07/02/1965 (57 y.o. Raymond [name] Primary Care Provider: [name] Other Clinician: Massie Kluver Referring Provider: Treating Provider/Extender: Hazle Coca in Treatment: [address] SHAD, ANDAYA ([id number]) [id number] Page 8 of 8 ICD-10 Codes Code  Description [phone] Chronic multifocal osteomyelitis, left ankle and foot E11.621 Type 2 diabetes mellitus with foot ulcer  [id number] Non-pressure chronic ulcer of other part of left foot with fat layer exposed I50.42 Chronic combined systolic (congestive) and diastolic (congestive) heart failure J44.9 Chronic obstructive pulmonary disease, unspecified Facility Procedures : CPT4 Code: [id number] Description: [id number] - WOUND CARE VISIT-LEV 2 EST PT Modifier: Quantity: 1 Physician Procedures : CPT4 Code Description Modifier [id number] - WC PHYS LEVEL 3 - EST PT ICD-10 Diagnosis Description E11.621 Type 2 diabetes mellitus with foot ulcer M86.372 Chronic multifocal osteomyelitis, left ankle and foot L97.522 Non-pressure chronic ulcer of  other part of left foot with fat layer exposed I50.42 Chronic combined systolic (congestive) and diastolic (congestive) heart failure Quantity: 1 Electronic Signature(s) Signed: 04/21/2022 4:42:37 PM By: [name] PA-C Previous Signature: 04/21/2022 4:10:18 PM Version By: Massie Kluver Entered By: [name] on 04/21/2022 16:42:37

## 2022-04-22 NOTE — Progress Notes (Signed)
AJON, BORSUK ([id number]) [id number] Page 1 of 2 Visit Report for 04/21/2022 HBO Details Patient Name: Date of Service: Raymond [name]. 04/21/2022 8:00 A M Medical Record Number: [id number] Patient Account Number: [id number] Date of Birth/Sex: Treating RN: 12/24/1965 (57 y.o. 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 HBO Treatment Details Treatment Number: 37 Patient Type: Outpatient Chamber Type: Monoplace Chamber Serial #: [id number] Treatment Protocol: 2.0 ATA with 90 minutes oxygen, and no air breaks Treatment Details Compression Rate Down: 1.5 psi / minute De-Compression Rate Up: Air breaks and breathing Decompress Decompress Compress Tx Pressure Begins Reached periods Begins Ends (leave unused spaces blank) Chamber Pressure (ATA 1 2 ------2 1 ) Clock Time (24 hr) 08:12 08:23 - - - - - - 08:23 10:00 Treatment Length: 108 (minutes) Treatment Segments: 4 Vital Signs Capillary Blood Glucose Reference Range: 80 - 120 mg / dl HBO Diabetic Blood Glucose Intervention Range: <131 mg/dl or >249 mg/dl Type: Time Vitals Blood Respiratory Capillary Blood Glucose Pulse Action Pulse: Temperature: Taken: Pressure: Rate: Glucose (mg/dl): Meter #: Oximetry (%) Taken: Pre 08:12 140/78 80 18 98 172 1 100 none per protocol Post 10:00 140/68 71 18 97.7 169 1 100 none per protocol Treatment Response Treatment Toleration: Well Treatment Completion Status: Treatment Completed without Adverse Event Electronic Signature(s) Signed: 04/21/2022 1:46:20 PM By: [name] RCP,  RRT CHT , , Signed: 04/21/2022 5:06:45 PM By: [name] PA-C Entered By: [name] on 04/21/2022 11:10:10 , [name] ([id number]) [id number] Page 2 of 2 -------------------------------------------------------------------------------- HBO Safety Checklist Details Patient Name: Date of Service: Raymond [name]. 04/21/2022 8:00 A M Medical Record Number: [id number] Patient Account Number: [id number] Date of Birth/Sex: Treating RN: 1965/07/13 (57 y.o. Raymond [name], Raymond [name] Primary Care Hayes Czaja: [name] Other Clinician: [name] Referring Rollie Hynek: Treating Kaleeya Hancock/Extender: [name] Weeks in Treatment: 15 HBO Safety Checklist Items Safety Checklist Consent Form Signed Patient voided / foley secured and emptied When did you last eato 04/21/22 am Last dose of injectable or oral agent 04/21/22 am metformin Ostomy pouch emptied and vented if applicable NA All implantable devices assessed, documented and approved NA Intravenous access site secured and place NA Valuables secured Linens and cotton and cotton/polyester blend (less than 51% polyester) Personal oil-based products / skin lotions / body lotions removed Wigs or hairpieces removed NA Smoking or tobacco materials removed NA Books / newspapers / magazines / loose paper removed NA Cologne, aftershave, perfume and deodorant removed Jewelry removed (may wrap wedding band) NA Make-up removed NA Hair care products removed Battery operated devices (external) removed NA Heating patches and chemical warmers removed NA Titanium eyewear removed NA Nail polish cured greater than 10 hours NA Casting material cured greater than 10 hours NA Hearing aids removed NA Loose dentures or partials removed NA Prosthetics have been removed NA Patient demonstrates correct use of air break device (if applicable) Patient concerns have been  addressed Patient grounding bracelet on and cord attached to chamber Specifics for Inpatients (complete in addition to above) Medication sheet sent with patient Intravenous medications needed or due during therapy sent with patient Drainage tubes (e.g. nasogastric tube or chest tube secured and vented) Endotracheal  or Tracheotomy tube secured Cuff deflated of air and inflated with saline Airway suctioned Electronic Signature(s) Signed: 04/21/2022 1:46:20 PM By: [name] RCP, RRT CHT , , Entered By: [name] on 04/21/2022 11:06:57 ,

## 2022-04-22 NOTE — Progress Notes (Addendum)
Raymond [name] ([id number]) [id number] Page 1 of 8 Visit Report for 04/21/2022 Arrival Information Details Patient Name: Date of Service: Raymond [name] 04/21/2022 10:45 A M Medical Record Number: [id number] Patient Account Number: [id number] Date of Birth/Sex: Treating [name]: [birth year] (57 y.o. Raymond [name] Primary Care Raymond [name]: Raymond [name] Other Clinician: Massie [name] Referring Kaitlynne Wenz: Treating [name]/Extender: Raymond [name] in Treatment: 15 Visit Information History Since Last Visit All ordered tests and consults were completed: No Patient Arrived: Ambulatory Added or deleted any medications: No Arrival Time: 10:49 Any new allergies or adverse reactions: No Transfer Assistance: Manual Had a fall or experienced change in No Patient Identification Verified: Yes activities of daily living that may affect Secondary Verification Process Completed: Yes risk of falls: Patient Requires Transmission-Based Precautions: No Signs or symptoms of abuse/neglect since last visito No Patient Has Alerts: Yes Hospitalized since last visit: No Patient Alerts: Type II Diabetic Implantable device outside of the clinic excluding No Aspirin 81mg  cellular tissue based products placed in the center ABI L [place]>220 since last visit: Has Dressing in Place as Prescribed: Yes Pain Present Now: No Electronic Signature(s) Signed: 04/21/2022 11:20:18 AM By: Raymond [name] Entered By: Raymond [name] on 04/21/2022 10:54:28 -------------------------------------------------------------------------------- Clinic Level of Care Assessment Details Patient Name: Date of Service: Raymond [name] 04/21/2022 10:45 A M Medical Record Number: [id number] Patient Account Number: [id number] Date of Birth/Sex: Treating [name]: 1965/02/11 (57 y.o. Raymond [name] Primary Care Naydelin Ziegler: Raymond [name] Other Clinician: Massie [name] Referring  Deovion Batrez: Treating [name]/Extender: Raymond [name] in Treatment: 15 Clinic Level of Care Assessment Items TOOL 4 Quantity Score []  - 0 Use when only an EandM is performed on FOLLOW-UP visit ASSESSMENTS - Nursing Assessment / Reassessment X- 1 10 Reassessment of Co-morbidities (includes updates in patient status) X- 1 5 Reassessment of Adherence to Treatment Plan Raymond [name], Raymond [name] ([id number]) [id number] Page 2 of 8 ASSESSMENTS - Wound and Skin A ssessment / Reassessment []  - 0 Simple Wound Assessment / Reassessment - one wound []  - 0 Complex Wound Assessment / Reassessment - multiple wounds X- 1 10 Dermatologic / Skin Assessment (not related to wound area) ASSESSMENTS - Focused Assessment []  - 0 Circumferential Edema Measurements - multi extremities []  - 0 Nutritional Assessment / Counseling / Intervention []  - 0 Lower Extremity Assessment (monofilament, tuning fork, pulses) []  - 0 Peripheral Arterial Disease Assessment (using hand held doppler) ASSESSMENTS - Ostomy and/or Continence Assessment and Care []  - 0 Incontinence Assessment and Management []  - 0 Ostomy Care Assessment and Management (repouching, etc.) PROCESS - Coordination of Care X - Simple Patient / Family Education for ongoing care 1 15 []  - 0 Complex (extensive) Patient / Family Education for ongoing care []  - 0 Staff obtains Programmer, systems, Records, T Results / Process Orders est []  - 0 Staff telephones HHA, Nursing Homes / Clarify orders / etc []  - 0 Routine Transfer to another Facility (non-emergent condition) []  - 0 Routine Hospital Admission (non-emergent condition) []  - 0 New Admissions / Biomedical engineer / Ordering NPWT Apligraf, etc. , []  - 0 Emergency Hospital Admission (emergent condition) X- 1 10 Simple Discharge Coordination []  - 0 Complex (extensive) Discharge Coordination PROCESS - Special Needs []  - 0 Pediatric / Minor  Patient Management []  - 0 Isolation Patient Management []  - 0 Hearing / Language / Visual special needs []  - 0 Assessment of Community assistance (transportation, D/C planning, etc.) []  - 0 Additional assistance / Altered mentation []  -  0 Support Surface(s) Assessment (bed, cushion, seat, etc.) INTERVENTIONS - Wound Cleansing / Measurement []  - 0 Simple Wound Cleansing - one wound []  - 0 Complex Wound Cleansing - multiple wounds []  - 0 Wound Imaging (photographs - any number of wounds) []  - 0 Wound Tracing (instead of photographs) []  - 0 Simple Wound Measurement - one wound []  - 0 Complex Wound Measurement - multiple wounds INTERVENTIONS - Wound Dressings []  - 0 Small Wound Dressing one or multiple wounds []  - 0 Medium Wound Dressing one or multiple wounds []  - 0 Large Wound Dressing one or multiple wounds []  - 0 Application of Medications - topical []  - 0 Application of Medications - injection INTERVENTIONS - Miscellaneous []  - 0 External ear exam Raymond [name], Raymond [name] ([id number]) [id number] Page 3 of 8 []  - 0 Specimen Collection (cultures, biopsies, blood, body fluids, etc.) []  - 0 Specimen(s) / Culture(s) sent or taken to Lab for analysis []  - 0 Patient Transfer (multiple staff / Harrel Lemon Lift / Similar devices) []  - 0 Simple Staple / Suture removal (25 or less) []  - 0 Complex Staple / Suture removal (26 or more) []  - 0 Hypo / Hyperglycemic Management (close monitor of Blood Glucose) []  - 0 Ankle / Brachial Index (ABI) - do not check if billed separately X- 1 5 Vital Signs Has the patient been seen at the hospital within the last three years: Yes Total Score: 55 Level Of Care: New/Established - Level 2 Electronic Signature(s) Signed: 04/21/2022 4:10:18 PM By: Raymond [name] Entered By: Raymond [name] on 04/21/2022 11:45:18 -------------------------------------------------------------------------------- Encounter Discharge  Information Details Patient Name: Date of Service: Raymond Eke D. 04/21/2022 10:45 A M Medical Record Number: [id number] Patient Account Number: [id number] Date of Birth/Sex: Treating [name]: March 01, 1965 (57 y.o. Raymond [name] Primary Care Holden Maniscalco: Raymond [name] Other Clinician: Massie [name] Referring Quenna Doepke: Treating Saturnino Liew/Extender: Raymond [name] in Treatment: 15 Encounter Discharge Information Items Discharge Condition: Stable Ambulatory Status: Ambulatory Discharge Destination: Home Transportation: Private Auto Accompanied By: self Schedule Follow-up Appointment: Yes Clinical Summary of Care: Electronic Signature(s) Signed: 04/21/2022 4:10:18 PM By: Raymond [name] Entered By: Raymond [name] on 04/21/2022 11:46:31 Lower Extremity Assessment Details -------------------------------------------------------------------------------- Raymond [name] ([id number]) [id number] Page 4 of 8 Patient Name: Date of Service: Raymond [name] 04/21/2022 10:45 A M Medical Record Number: [id number] Patient Account Number: [id number] Date of Birth/Sex: Treating [name]: Jun 29, 1965 (57 y.o. Raymond [name] Primary Care Denece Shearer: Raymond [name] Other Clinician: Massie [name] Referring Jovita Persing: Treating Nicci Vaughan/Extender: [name] Weeks in Treatment: 15 Electronic Signature(s) Signed: 04/21/2022 11:20:18 AM By: Raymond [name] Signed: 04/21/2022 4:35:37 PM By: Raymond [name] BSN, [name], Raymond [name], Raymond [name], BSN Entered By: Raymond [name] on 04/21/2022 10:56:58 -------------------------------------------------------------------------------- Multi Wound Chart Details Patient Name: Date of Service: Raymond Eke D. 04/21/2022 10:45 A M Medical Record Number: [id number] Patient Account Number: [id number] Date of Birth/Sex: Treating [name]: December 23, 1965 (57 y.o. Raymond [name] Primary Care Denitra Donaghey: Raymond [name] Other Clinician:  Massie [name] Referring Takako Minckler: Treating Precilla Purnell/Extender: [name] Weeks in Treatment: 15 Vital Signs Height(in): 74 Pulse(bpm): 80 Weight(lbs): 371 Blood Pressure(mmHg): 140/78 Body Mass Index(BMI): 47.6 Temperature(F): 98.0 Respiratory Rate(breaths/min): 18 [Treatment Notes:Wound Assessments Treatment Notes] Electronic Signature(s) Signed: 04/21/2022 11:20:18 AM By: Raymond [name] Entered By: Raymond [name] on 04/21/2022 10:57:05 -------------------------------------------------------------------------------- Multi-Disciplinary Care Plan Details Patient Name: Date of Service: Raymond Eke D. 04/21/2022 10:45 A M Medical Record Number: [id number] Patient Account Number: [id number] Date of Birth/Sex: Treating [name]:  1965/09/24 (57 y.o. Raymond [name] Primary Care Mckade Gurka: Raymond [name] Other Clinician: Massie [name] Referring Iridian Reader: Treating Gianella Chismar/Extender: Raymond [name] in Treatment: [address] [place] ([id number]) [id number] Page 5 of 8 Active Inactive Medication Nursing Diagnoses: Knowledge deficit related to medication safety: actual or potential Goals: Patient/caregiver will demonstrate understanding of all current medications Date Initiated: 01/03/2022 T arget Resolution Date: 03/11/2022 Goal Status: Active Interventions: Assess for medication contraindications each visit where new medications are prescribed Assess patient/caregiver ability to manage medication regimen upon admission and as needed Patient/Caregiver given reconciled medication list upon admission, changes in medications and discharge from the [facility] Provide education on medication safety Notes: Pressure Nursing Diagnoses: Knowledge deficit related to causes and risk factors for pressure ulcer development Knowledge deficit related to management of pressures ulcers Potential for impaired tissue integrity  related to pressure, friction, moisture, and shear Goals: Patient will remain free from development of additional pressure ulcers Date Initiated: 01/03/2022 Date Inactivated: 03/31/2022 Target Resolution Date: 03/03/2022 Goal Status: Met Patient/caregiver will verbalize risk factors for pressure ulcer development Date Initiated: 01/03/2022 Target Resolution Date: 03/03/2022 Goal Status: Active Patient/caregiver will verbalize understanding of pressure ulcer management Date Initiated: 01/03/2022 Target Resolution Date: 03/03/2022 Goal Status: Active Interventions: Assess potential for pressure ulcer upon admission and as needed Notes: Soft Tissue Infection Nursing Diagnoses: Impaired tissue integrity Knowledge deficit related to disease process and management Knowledge deficit related to home infection control: handwashing, handling of soiled dressings, supply storage Potential for infection: soft tissue Goals: Patient will remain free of wound infection Date Initiated: 01/03/2022 Date Inactivated: 03/31/2022 Target Resolution Date: 03/03/2022 Goal Status: Met Patient/caregiver will verbalize understanding of or measures to prevent infection and contamination in the home setting Date Initiated: 01/03/2022 Target Resolution Date: 03/03/2022 Goal Status: Active Patient's soft tissue infection will resolve Date Initiated: 01/03/2022 Date Inactivated: 03/03/2022 Target Resolution Date: 02/18/2022 Goal Status: Met Signs and symptoms of infection will be recognized early to allow for prompt treatment Date Initiated: 01/03/2022 Target Resolution Date: 03/03/2022 Goal Status: Active Interventions: Assess signs and symptoms of infection every visit Treatment Activities: Culture and sensitivity : 01/03/2022 Notes: Wound/Skin Impairment Nursing Diagnoses: Raymond [name], Raymond [name] ([id number]) [id number] Page 6 of 8 Impaired tissue integrity Knowledge deficit related to  ulceration/compromised skin integrity Goals: Ulcer/skin breakdown will have a volume reduction of 30% by week 4 Date Initiated: 02/18/2022 Target Resolution Date: 02/18/2022 Goal Status: Active Ulcer/skin breakdown will have a volume reduction of 50% by week 8 Date Initiated: 02/18/2022 Target Resolution Date: 03/18/2022 Goal Status: Active Interventions: Assess patient/caregiver ability to obtain necessary supplies Assess ulceration(s) every visit Treatment Activities: Consult for HBO : 02/18/2022 Referred to DME Raymond [name] for dressing supplies : 02/18/2022 Skin care regimen initiated : 02/18/2022 Notes: Electronic Signature(s) Signed: 04/21/2022 4:10:18 PM By: Raymond [name] Signed: 04/21/2022 4:35:37 PM By: Raymond [name], BSN, [name], Raymond [name], Raymond [name], BSN Entered By: Raymond [name] on 04/21/2022 11:45:30 -------------------------------------------------------------------------------- Pain Assessment Details Patient Name: Date of Service: Raymond Eke D. 04/21/2022 10:45 A M Medical Record Number: [id number] Patient Account Number: [id number] Date of Birth/Sex: Treating [name]: July 28, 1965 (57 y.o. Raymond [name] Primary Care Breckyn Ticas: Raymond [name] Other Clinician: Massie [name] Referring Kalup Jaquith: Treating Eila Runyan/Extender: Raymond [name] in Treatment: 15 Active Problems Location of Pain Severity and Description of Pain Patient Has Paino No Site Locations Pain Management and Medication Current Pain Management: SAUL, LAWERY ([id number]) [phone].pdf Page 7 of 8 Electronic Signature(s) Signed: 04/21/2022 11:20:18 AM By: Raymond [name] Signed: 04/21/2022  4:35:37 PM By: Raymond [name], BSN, [name], Raymond [name], Raymond [name], BSN Entered By: Raymond [name] on 04/21/2022 10:55:56 -------------------------------------------------------------------------------- Patient/Caregiver Education Details Patient Name: Date of Service: Raymond [name]  3/21/2024andnbsp10:45 A M Medical Record Number: [id number] Patient Account Number: [id number] Date of Birth/Gender: Treating [name]: [birth year] (57 y.o. Raymond [name] Primary Care Physician: Raymond [name] Other Clinician: Massie [name] Referring Physician: Treating Physician/Extender: Raymond [name] in Treatment: 15 Education Assessment Education Provided To: Patient Education Topics Provided Wound/Skin Impairment: Handouts: Other: continue wound care as directed Methods: Explain/Verbal Responses: State content correctly Electronic Signature(s) Signed: 04/21/2022 4:10:18 PM By: Raymond [name] Entered By: Raymond [name] on 04/21/2022 11:46:00 -------------------------------------------------------------------------------- [place] Details Patient Name: Date of Service: Raymond Eke D. 04/21/2022 10:45 A M Medical Record Number: [id number] Patient Account Number: [id number] Date of Birth/Sex: Treating [name]: 10/13/1965 (57 y.o. Raymond [name] Primary Care Mackenize Delgadillo: Raymond [name] Other Clinician: Massie [name] Referring Heatherly Stenner: Treating Jr Milliron/Extender: [name] Weeks in Treatment: 15 Vital Signs Time Taken: 10:55 Temperature (F): 98.0 Height (in): 74 Pulse (bpm): 80 Weight (lbs): 371 Respiratory Rate (breaths/min): [address] ([id number]) [id number] Page 8 of 8 Body Mass Index (BMI): 47.6 Blood Pressure (mmHg): 140/78 Reference Range: 80 - 120 mg / dl Electronic Signature(s) Signed: 04/21/2022 11:20:18 AM By: Raymond [name] Entered By: Raymond [name] on 04/21/2022 10:55:51

## 2022-04-23 NOTE — Progress Notes (Signed)
RUTHFORD, KOZICKI ([id number]) [id number] Page 1 of 2 Visit Report for 04/22/2022 Arrival Information Details Patient Name: Date of Service: Raymond [name]. 04/22/2022 8:00 A M Medical Record Number: [id number] Patient Account Number: [id number] Date of Birth/Sex: Treating RN: [birth year] (57 y.o. [name], [name] Primary Care Gorden Stthomas: [name] Other Clinician: [name] Referring Drishti Pepperman: Treating Naila Elizondo/Extender: Hazle Coca in Treatment: 15 Visit Information History Since Last Visit Added or deleted any medications: No Patient Arrived: Ambulatory Any new allergies or adverse reactions: No Arrival Time: 08:22 Had a fall or experienced change in No Accompanied By: self activities of daily living that may affect Transfer Assistance: None risk of falls: Patient Identification Verified: Yes Signs or symptoms of abuse/neglect since last visito No Secondary Verification Process Completed: Yes Hospitalized since last visit: No Patient Requires Transmission-Based Precautions: No Implantable device outside of the clinic excluding No Patient Has Alerts: Yes cellular tissue based products placed in the center Patient Alerts: Type II Diabetic since last visit: Aspirin 81mg  Has Dressing in Place as Prescribed: Yes ABI L [place]>220 Pain Present Now: No Electronic Signature(s) Signed: 04/22/2022 12:50:28 PM By: [name] RCP, RRT CHT , , Entered By: [name] on 04/22/2022 10:33:45 , -------------------------------------------------------------------------------- Encounter Discharge Information Details Patient Name: Date of Service: Raymond [name], Raymond Negus D. 04/22/2022 8:00 A M Medical Record Number: [id number] Patient Account Number: [id number] Date of Birth/Sex: Treating RN: September 09, 1965 (57 y.o. Raymond [name] Primary Care Raymond [name]: [name] Other Clinician: [name] Referring Omran Keelin: Treating Danthony Kendrix/Extender: Hazle Coca in Treatment: 15 Encounter Discharge Information Items Discharge Condition: Stable Ambulatory Status: Ambulatory Discharge Destination: Home Transportation: Other Accompanied By: self Schedule Follow-up Appointment: Yes Clinical Summary of Care: Electronic Signature(s) Signed: 04/22/2022 12:50:28 PM By: [name] RCP, RRT CHT , , Entered By: [name] on 04/22/2022 10:36:05 , -------------------------------------------------------------------------------- Vitals Details Patient Name: Date of Service: Raymond [name], Raymond Negus D. 04/22/2022 8:00 A M Medical Record Number: [id number] Patient Account Number: [id number] Date of Birth/Sex: Treating RN: 1965/03/17 (57 y.o. Raymond [name] Primary Care Bebe Moncure: [name] Other Clinician: [name] Referring Xandrea Clarey: Treating Marquesa Rath/Extender: [name] Weeks in Treatment: 15 Vital Signs Time Taken: 08:08 Temperature (F): 98.0 Raymond [name] ([id number]) [id number] Page 2 of 2 Height (in): 74 Pulse (bpm): 83 Weight (lbs): 371 Respiratory Rate (breaths/min): 18 Body Mass Index (BMI): 47.6 Blood Pressure (mmHg): 140/78 Capillary Blood Glucose (mg/dl): 189 Reference Range: 80 - 120 mg / dl Airway Pulse Oximetry (%): 96 Electronic Signature(s) Signed: 04/22/2022 12:50:28 PM By: [name] RCP, RRT CHT , , Entered By: [name] on 04/22/2022 10:33:51 ,

## 2022-04-23 NOTE — Progress Notes (Signed)
VEERAJ, NAEEM ([id number]) [id number] Page 1 of 2 Visit Report for 04/22/2022 HBO Details Patient Name: Date of Service: Raymond [name]. 04/22/2022 8:00 A M Medical Record Number: [id number] Patient Account Number: [id number] Date of Birth/Sex: Treating RN: [birth year] (57 y.o. 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 HBO Treatment Details Treatment Number: 38 Patient Type: Outpatient Chamber Type: Monoplace Chamber Serial #: [id number] Treatment Protocol: 2.0 ATA with 90 minutes oxygen, and no air breaks Treatment Details Compression Rate Down: 1.5 psi / minute De-Compression Rate Up: Air breaks and breathing Decompress Decompress Compress Tx Pressure Begins Reached periods Begins Ends (leave unused spaces blank) Chamber Pressure (ATA 1 2 ------2 1 ) Clock Time (24 hr) 08:08 08:19 - - - - - - 09:50 10:00 Treatment Length: 112 (minutes) Treatment Segments: 4 Vital Signs Capillary Blood Glucose Reference Range: 80 - 120 mg / dl HBO Diabetic Blood Glucose Intervention Range: <131 mg/dl or >249 mg/dl Type: Time Vitals Blood Respiratory Capillary Blood Glucose Pulse Action Pulse: Temperature: Taken: Pressure: Rate: Glucose (mg/dl): Meter #: Oximetry (%) Taken: Pre 08:08 140/78 83 18 98 189 1 96 none per protocol Post 10:00 134/68 69 18 97.8 165 1 98 none per protocol Treatment Response Treatment Toleration: Well Treatment Completion Status: Treatment Completed without Adverse Event Electronic Signature(s) Signed: 04/22/2022 12:50:28 PM By: [name] RCP,  RRT CHT , , Signed: 04/22/2022 2:10:36 PM By: [name] PA-C Entered By: [name] on 04/22/2022 10:35:18 , -------------------------------------------------------------------------------- HBO Safety Checklist Details Patient Name: Date of Service: [name], [name] 04/22/2022 8:00 A M Medical Record Number: [id number] Patient Account Number: [id number] Date of Birth/Sex: Treating RN: May 05, 1965 (57 y.o. [name], [name] Primary Care Shandrell Boda: [name] Other Clinician: [name] Referring Myleen Brailsford: Treating Ijeoma Loor/Extender: [name] Weeks in Treatment: 15 HBO Safety Checklist Items Safety Checklist Consent Form Signed YUKI, FRELICH ([id number]) [phone] Page 2 of 2 Patient voided / foley secured and emptied When did you last eato 04/22/22 am Last dose of injectable or oral agent 04/22/22 metformin Ostomy pouch emptied and vented if applicable NA All implantable devices assessed, documented and approved NA Intravenous access site secured and place NA Valuables secured Linens and cotton and cotton/polyester blend (less than 51% polyester) Personal oil-based products / skin lotions / body lotions removed Wigs or hairpieces removed NA Smoking or tobacco materials removed NA Books / newspapers / magazines / loose paper removed NA Cologne, aftershave, perfume and deodorant removed Jewelry removed (may wrap wedding band) Make-up removed NA Hair care products removed Battery operated devices (external) removed NA Heating patches and chemical warmers removed NA Titanium eyewear removed NA Nail polish cured greater than 10 hours NA Casting material cured greater than 10 hours NA Hearing aids removed NA Loose dentures or partials removed NA Prosthetics have been removed NA Patient demonstrates correct use of air break device (if applicable) Patient concerns have been  addressed Patient grounding bracelet on and cord attached to chamber Specifics for Inpatients (complete in addition to above) Medication sheet sent with patient Intravenous medications needed or due during therapy sent with patient Drainage tubes (e.g. nasogastric tube or chest tube secured and vented) Endotracheal or Tracheotomy  tube secured Cuff deflated of air and inflated with saline Airway suctioned Electronic Signature(s) Signed: 04/22/2022 12:50:28 PM By: [name] RCP, RRT CHT , , Entered By: [name] on 04/22/2022 10:33:56 ,

## 2022-04-25 ENCOUNTER — Encounter: Payer: Medicare HMO | Admitting: Physician Assistant

## 2022-04-25 DIAGNOSIS — M86372 Chronic multifocal osteomyelitis, left ankle and foot: Secondary | ICD-10-CM | POA: Diagnosis not present

## 2022-04-25 LAB — GLUCOSE, CAPILLARY
Glucose-Capillary: 174 mg/dL — ABNORMAL HIGH (ref 70–99)
Glucose-Capillary: 165 mg/dL — ABNORMAL HIGH (ref 70–99)

## 2022-04-25 NOTE — Progress Notes (Signed)
DREYSON, LEAMY ([id number]) [id number] Page 1 of 2 Visit Report for 04/25/2022 HBO Details Patient Name: Date of Service: Raymond [name]. 04/25/2022 8:00 A M Medical Record Number: [id number] Patient Account Number: [id number] Date of Birth/Sex: Treating RN: [birth year] (57 y.o. 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 HBO Treatment Details Treatment Number: 39 Patient Type: Outpatient Chamber Type: Monoplace Chamber Serial #: [id number] Treatment Protocol: 2.0 ATA with 90 minutes oxygen, and no air breaks Treatment Details Compression Rate Down: 1.5 psi / minute De-Compression Rate Up: 2.0 psi / minute Air breaks and breathing Decompress Decompress Compress Tx Pressure Begins Reached periods Begins Ends (leave unused spaces blank) Chamber Pressure (ATA 1 2 ------2 1 ) Clock Time (24 hr) 08:18 08:30 - - - - - - 10:00 10:08 Treatment Length: 110 (minutes) Treatment Segments: 4 Vital Signs Capillary Blood Glucose Reference Range: 80 - 120 mg / dl HBO Diabetic Blood Glucose Intervention Range: <131 mg/dl or >249 mg/dl Type: Time Vitals Blood Respiratory Capillary Blood Glucose Pulse Action Pulse: Temperature: Taken: Pressure: Rate: Glucose (mg/dl): Meter #: Oximetry (%) Taken: Pre 08:11 142/64 84 18 97.9 174 1 none per protocol Post 10:14 130/62 72 18 98.4 165 1 none per protocol Treatment Response Treatment Toleration: Well Treatment Completion Status: Treatment Completed without Adverse Event Electronic Signature(s) Signed: 04/25/2022 1:20:10 PM By: [name] RCP, RRT CHT , , Signed: 04/25/2022 4:46:02 PM By: [name] PA-C Entered By: [name] on 04/25/2022 11:10:04 , -------------------------------------------------------------------------------- HBO Safety Checklist Details Patient Name: Date of Service: Raymond [name], Raymond Negus D. 04/25/2022 8:00 A M Medical Record Number: [id number] Patient Account Number: [id number] Date of Birth/Sex: Treating RN: 11/23/1965 (57 y.o. Raymond [name], [name] Primary Care Zakiah Gauthreaux: Raymond [name] Other Clinician: [name] Referring Tamarius Rosenfield: Treating Canna Nickelson/Extender: Raymond [name] Weeks in Treatment: 16 HBO Safety Checklist Items Safety Checklist Consent Form Signed [name], [name] ([id number]) [id number] Page 2 of 2 Patient voided / foley secured and emptied When did you last eato 04/25/22 am Last dose of injectable or oral agent 04/25/22 am metformin Ostomy pouch emptied and vented if applicable NA All implantable devices assessed, documented and approved NA Intravenous access site secured and place NA Valuables secured Linens and cotton and cotton/polyester blend (less than 51% polyester) Personal oil-based products / skin lotions / body lotions removed Wigs or hairpieces removed NA Smoking or tobacco materials removed NA Books / newspapers / magazines / loose paper removed NA Cologne, aftershave, perfume and deodorant removed Jewelry removed (may wrap wedding band) Make-up removed NA Hair care products removed Battery operated devices (external) removed NA Heating patches and chemical warmers removed NA Titanium eyewear removed NA Nail polish cured greater than 10 hours NA Casting material cured greater than 10 hours NA Hearing aids removed NA Loose dentures or partials removed NA Prosthetics have been removed NA Patient demonstrates correct use of air break device (if applicable) Patient concerns have been  addressed Patient grounding bracelet on and cord attached to chamber Specifics for Inpatients (complete in addition to above) Medication sheet sent with patient Intravenous medications needed or due during therapy sent with patient Drainage tubes (e.g. nasogastric tube or chest tube secured and vented)  Endotracheal or Tracheotomy tube secured Cuff deflated of air and inflated with saline Airway suctioned Electronic Signature(s) Signed: 04/25/2022 1:20:10 PM By: [name] RCP, RRT CHT , , Entered By: [name] on 04/25/2022 08:58:10 ,

## 2022-04-25 NOTE — Progress Notes (Signed)
Raymond, [name] ([id number]) [id number] Page 1 of 2 Visit Report for 04/25/2022 Arrival Information Details Patient Name: Date of Service: Raymond [name]. 04/25/2022 8:00 A M Medical Record Number: [id number] Patient Account Number: [id number] Date of Birth/Sex: Treating RN: October 24, 1965 (57 y.o. Raymond [name], Raymond [name] Primary Care Zakye Baby: [name] Other Clinician: [name] Referring Raheen Capili: Treating Fiana Gladu/Extender: Hazle Coca in Treatment: 40 Visit Information History Since Last Visit Added or deleted any medications: No Patient Arrived: Ambulatory Any new allergies or adverse reactions: No Arrival Time: 08:00 Had a fall or experienced change in No Accompanied By: transportation activities of daily living that may affect Transfer Assistance: None risk of falls: Patient Identification Verified: Yes Signs or symptoms of abuse/neglect since last visito No Secondary Verification Process Completed: Yes Hospitalized since last visit: No Patient Requires Transmission-Based Precautions: No Implantable device outside of the clinic excluding No Patient Has Alerts: Yes cellular tissue based products placed in the center Patient Alerts: Type II Diabetic since last visit: Aspirin 81mg  Pain Present Now: No ABI L [place]>220 Electronic Signature(s) Signed: 04/25/2022 1:20:10 PM By: [name] RCP, RRT CHT , , Entered By: [name] on 04/25/2022 08:56:19 , -------------------------------------------------------------------------------- Encounter Discharge Information Details Patient Name: Date of Service: Raymond [name], Raymond Negus D. 04/25/2022 8:00 A M Medical Record Number: [id number] Patient Account Number: [id number] Date of Birth/Sex: Treating RN: August 24, 1965 (57 y.o. Raymond [name], Raymond [name] Primary Care Hensley Treat: [name] Other Clinician: [name] Referring Daniella Dewberry: Treating  Kealy Lewter/Extender: Hazle Coca in Treatment: 16 Encounter Discharge Information Items Discharge Condition: Stable Ambulatory Status: Ambulatory Discharge Destination: Home Transportation: Other Accompanied By: transportation Schedule Follow-up Appointment: Yes Clinical Summary of Care: Notes Patient has an HBO treatment schedule on 04/26/22 at 08:00 am. Electronic Signature(s) Signed: 04/25/2022 1:20:10 PM By: [name] RCP, RRT CHT , , Entered By: [name] on 04/25/2022 11:15:33 , -------------------------------------------------------------------------------- Vitals Details Patient Name: Date of Service: Raymond [name], Raymond Negus D. 04/25/2022 8:00 A M Medical Record Number: [id number] Patient Account Number: [id number] Date of Birth/Sex: Treating RN: Oct 21, 1965 (57 y.o. [name] Primary Care Allex Lapoint: [name] Other Clinician: [name] Referring Rachelanne Whidby: Treating Pratyush Ammon/Extender: Hazle Coca in Treatment: 1 Raymond [name], Raymond [name] ([id number]) [id number] Page 2 of 2 Time Taken: 08:11 Temperature (F): 97.9 Height (in): 74 Pulse (bpm): 84 Weight (lbs): 371 Respiratory Rate (breaths/min): 18 Body Mass Index (BMI): 47.6 Blood Pressure (mmHg): 142/64 Capillary Blood Glucose (mg/dl): 174 Reference Range: 80 - 120 mg / dl Electronic Signature(s) Signed: 04/25/2022 1:20:10 PM By: [name] RCP, RRT CHT , , Entered By: [name] on 04/25/2022 08:57:01 ,

## 2022-04-26 ENCOUNTER — Encounter: Payer: Medicare HMO | Admitting: Physician Assistant

## 2022-04-26 DIAGNOSIS — M86372 Chronic multifocal osteomyelitis, left ankle and foot: Secondary | ICD-10-CM | POA: Diagnosis not present

## 2022-04-26 LAB — GLUCOSE, CAPILLARY
Glucose-Capillary: 246 mg/dL — ABNORMAL HIGH (ref 70–99)
Glucose-Capillary: 233 mg/dL — ABNORMAL HIGH (ref 70–99)

## 2022-04-26 NOTE — Progress Notes (Signed)
DEVONTAYE, EDGHILL ([id number]) [id number] Page 1 of 2 Visit Report for 04/26/2022 Arrival Information Details Patient Name: Date of Service: Raymond [name]. 04/26/2022 8:00 A M Medical Record Number: [id number] Patient Account Number: [id number] Date of Birth/Sex: Treating RN: 1965/11/05 (57 y.o. [name], [name] Primary Care Jaykub Mackins: [name] Other Clinician: [name] Referring Neisha Hinger: Treating Jaimon Bugaj/Extender: Hazle Coca in Treatment: 45 Visit Information History Since Last Visit Added or deleted any medications: No Patient Arrived: Ambulatory Any new allergies or adverse reactions: No Arrival Time: 07:45 Had a fall or experienced change in No Accompanied By: transportation activities of daily living that may affect Transfer Assistance: None risk of falls: Patient Identification Verified: Yes Signs or symptoms of abuse/neglect since last visito No Secondary Verification Process Completed: Yes Hospitalized since last visit: No Patient Requires Transmission-Based Precautions: No Implantable device outside of the clinic excluding No Patient Has Alerts: Yes cellular tissue based products placed in the center Patient Alerts: Type II Diabetic since last visit: Aspirin 81mg  Pain Present Now: No ABI L [place]>220 Electronic Signature(s) Signed: 04/26/2022 1:42:39 PM By: [name] RCP, RRT CHT , , Entered By: [name] on 04/26/2022 08:36:53 , -------------------------------------------------------------------------------- Encounter Discharge Information Details Patient Name: Date of Service: Raymond [name], Raymond Negus D. 04/26/2022 8:00 A M Medical Record Number: [id number] Patient Account Number: [id number] Date of Birth/Sex: Treating RN: 18-Jul-1965 (57 y.o. Raymond [name] Primary Care Turhan Chill: [name] Other Clinician: [name] Referring Madden Garron: Treating  Sharnay Cashion/Extender: Hazle Coca in Treatment: 16 Encounter Discharge Information Items Discharge Condition: Stable Ambulatory Status: Ambulatory Discharge Destination: Home Transportation: Other Accompanied By: self Schedule Follow-up Appointment: No Clinical Summary of Care: Electronic Signature(s) Signed: 04/26/2022 10:41:19 AM By: Gretta Cool, BSN, RN, CWS, Kim RN, BSN Entered By: Gretta Cool, BSN, RN, CWS, Kim on 04/26/2022 10:41:19 -------------------------------------------------------------------------------- [place] Details Patient Name: Date of Service: Raymond [name], Raymond Negus D. 04/26/2022 8:00 A M Medical Record Number: [id number] Patient Account Number: [id number] Date of Birth/Sex: Treating RN: 02-Jul-1965 (57 y.o. Raymond [name] Primary Care Sheniece Ruggles: [name] Other Clinician: [name] Referring Gabriellia Rempel: Treating Zorah Backes/Extender: [name] Weeks in Treatment: 16 Vital Signs Time Taken: 07:47 Temperature (F): 98.6 Height (in): 74 Pulse (bpm): [address] ([id number]) [id number] Page 2 of 2 Weight (lbs): 371 Respiratory Rate (breaths/min): 18 Body Mass Index (BMI): 47.6 Blood Pressure (mmHg): 144/66 Capillary Blood Glucose (mg/dl): 246 Reference Range: 80 - 120 mg / dl Electronic Signature(s) Signed: 04/26/2022 1:42:39 PM By: [name] RCP, RRT CHT , , Entered By: [name] on 04/26/2022 08:39:03 ,

## 2022-04-27 ENCOUNTER — Encounter: Payer: Medicare HMO | Admitting: Internal Medicine

## 2022-04-27 NOTE — Progress Notes (Signed)
SEARLE, CASTORINA ([id number]) [id number] Page 1 of 1 Visit Report for 04/26/2022 Physician Orders Details Patient Name: Date of Service: Raymond [name]. 04/26/2022 8:00 A M Medical Record Number: [id number] Patient Account Number: [id number] Date of Birth/Sex: Treating RN: [birth year] (57 y.o. Raymond [name] Primary Care Provider: [name] Other Clinician: [name] Referring Provider: Treating Provider/Extender: Hazle Coca in Treatment: 16 Verbal / Phone Orders: No Diagnosis Coding Discharge From [facility] Services Discharge from [place] Treatment Complete - treatment complete Electronic Signature(s) Signed: 04/26/2022 10:41:03 AM By: Gretta Cool, BSN, RN, CWS, Kim RN, BSN Signed: 04/26/2022 6:13:56 PM By: [name] PA-C Entered By: Gretta Cool, BSN, RN, CWS, Kim on 04/26/2022 10:41:03 -------------------------------------------------------------------------------- [place] Details Patient Name: Date of Service: Raymond Eke D. 04/26/2022 Medical Record Number: [id number] Patient Account Number: [id number] Date of Birth/Sex: Treating RN: 06/11/1965 (57 y.o. 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) and diastolic (congestive) heart failure J44.9 Chronic obstructive pulmonary disease, unspecified Facility Procedures : CPT4 Code: [id number] Description: (Facility Use Only) HBOT full body chamber, 45min , Modifier: Quantity: 4 Physician Procedures : CPT4: Description  Modifier Code [phone] Physician attendance and supervision of hyperbaric oxygen therapy, per sessionsupervision of hyperbaric Oxygen therapy, per session ICD-10 Diagnosis Description M86.372 Chronic multifocal osteomyelitis, left  ankle and foot E11.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

## 2022-04-27 NOTE — Progress Notes (Signed)
ODIE, NEHER ([id number]) [id number] Page 1 of 2 Visit Report for 04/26/2022 HBO Details Patient Name: Date of Service: Raymond [name]. 04/26/2022 8:00 A M Medical Record Number: [id number] Patient Account Number: [id number] Date of Birth/Sex: Treating RN: 01/30/1966 (57 y.o. 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 HBO Treatment End 04/26/2022 Date: HBO Discharge Treatment Series Complete; Improved Wound Outcome: Perfusion HBO Treatment Details Treatment Number: 40 Patient Type: Outpatient Chamber Type: Monoplace Chamber Serial #: [id number] Treatment Protocol: 2.0 ATA with 90 minutes oxygen, and no air breaks Treatment Details Compression Rate Down: 1.5 psi / minute De-Compression Rate Up: Air breaks and breathing Decompress Decompress Compress Tx Pressure Begins Reached periods Begins Ends (leave unused spaces blank) Chamber Pressure (ATA 1 2 ------2 1 ) Clock Time (24 hr) 08:05 08:17 - - - - - - 09:47 09:58 Treatment Length: 113 (minutes) Treatment Segments: 4 Vital Signs Capillary Blood Glucose Reference Range: 80 - 120 mg / dl HBO Diabetic Blood Glucose Intervention Range: <131 mg/dl or >249 mg/dl Type: Time Vitals Blood Respiratory Capillary Blood Glucose Pulse Action Pulse: Temperature: Taken: Pressure: Rate: Glucose (mg/dl): Meter #: Oximetry (%) Taken: Pre 07:47 144/66 78 18 98.6 246 1 none per protocol Post 10:19 118/64 78 18 98.4 233 discharged home Treatment Response Treatment Toleration: Well Treatment Completion Status: Treatment Completed  without Adverse Event Electronic Signature(s) Signed: 04/26/2022 1:42:39 PM By: [name] RCP, RRT CHT , , Signed: 04/26/2022 6:13:56 PM By: [name] PA-C Previous Signature: 04/26/2022 10:39:57 AM Version By: Gretta Cool, BSN, RN, CWS, Kim RN, BSN Previous Signature: 04/26/2022 9:48:08 AM Version By: Gretta Cool, BSN, RN, CWS, Kim RN, BSN Entered By: [name] on 04/26/2022 11:36:38 , -------------------------------------------------------------------------------- HBO Safety Checklist Details Patient Name: Date of Service: Raymond [name], Raymond Negus D. 04/26/2022 8:00 A M Medical Record Number: [id number] Patient Account Number: [id number] Date of Birth/Sex: Treating RN: Feb 06, 1965 (57 y.o. Raymond [name] Primary Care Raymond [name]: Raymond [name] Other Clinician: [name] Referring Raymond [name]: Treating Raymond [name]/Extender: Raymond [name] in Treatment: [address] [place] D ([id number]) [id number] Page 2 of 2 HBO Safety Checklist Items Safety Checklist Consent Form Signed Patient voided / foley secured and emptied When did you last eato 03/28/22 am Last dose of injectable or oral agent 03/28/22 am metformin Ostomy pouch emptied and vented if applicable NA All implantable devices assessed, documented and approved NA Intravenous access site secured and place NA Valuables secured Linens and cotton and cotton/polyester blend (less than 51% polyester) Personal oil-based products / skin lotions / body lotions removed Wigs or hairpieces removed NA Smoking or tobacco materials removed NA Books / newspapers / magazines / loose paper removed NA Cologne, aftershave, perfume and deodorant removed Jewelry removed (may wrap wedding band) Make-up removed NA Hair care products removed Battery operated devices (external) removed NA Heating patches and chemical warmers removed NA Titanium eyewear removed NA Nail polish cured  greater than 10 hours NA Casting material cured greater than 10 hours NA Hearing aids removed NA Loose dentures or partials removed NA Prosthetics have been removed NA Patient demonstrates correct use of air break device (if applicable) Patient concerns have been addressed Patient grounding bracelet on and cord attached  to chamber Specifics for Inpatients (complete in addition to above) Medication sheet sent with patient NA Intravenous medications needed or due during therapy sent with patient Drainage tubes (e.g. nasogastric tube or chest tube secured and vented) Endotracheal or Tracheotomy tube secured Cuff deflated of air and inflated with saline Airway suctioned Electronic Signature(s) Signed: 04/26/2022 1:42:39 PM By: [name] RCP, RRT CHT , , Entered By: [name] on 04/26/2022 09:04:18 ,

## 2022-04-28 ENCOUNTER — Encounter: Payer: Medicare HMO | Admitting: Physician Assistant

## 2022-04-29 ENCOUNTER — Encounter: Payer: Medicare HMO | Admitting: Physician Assistant

## 2022-06-07 ENCOUNTER — Other Ambulatory Visit: Payer: Self-pay

## 2022-06-07 ENCOUNTER — Emergency Department: Payer: Medicare HMO

## 2022-06-07 ENCOUNTER — Inpatient Hospital Stay: Payer: Medicare HMO

## 2022-06-07 ENCOUNTER — Inpatient Hospital Stay
Admission: EM | Admit: 2022-06-07 | Discharge: 2022-06-09 | DRG: 617 | Disposition: A | Discharge status: Home / Self Care | Payer: Medicare HMO | Attending: Internal Medicine | Admitting: Internal Medicine

## 2022-06-07 ENCOUNTER — Ambulatory Visit: Payer: Medicare HMO | Admitting: Podiatry

## 2022-06-07 DIAGNOSIS — E785 Hyperlipidemia, unspecified: Secondary | ICD-10-CM | POA: Diagnosis present

## 2022-06-07 DIAGNOSIS — Z79899 Other long term (current) drug therapy: Secondary | ICD-10-CM

## 2022-06-07 DIAGNOSIS — I70235 Atherosclerosis of native arteries of right leg with ulceration of other part of foot: Secondary | ICD-10-CM | POA: Diagnosis not present

## 2022-06-07 DIAGNOSIS — Z9981 Dependence on supplemental oxygen: Secondary | ICD-10-CM | POA: Diagnosis not present

## 2022-06-07 DIAGNOSIS — Z823 Family history of stroke: Secondary | ICD-10-CM

## 2022-06-07 DIAGNOSIS — M86171 Other acute osteomyelitis, right ankle and foot: Secondary | ICD-10-CM

## 2022-06-07 DIAGNOSIS — I5030 Unspecified diastolic (congestive) heart failure: Secondary | ICD-10-CM | POA: Diagnosis not present

## 2022-06-07 DIAGNOSIS — Z9989 Dependence on other enabling machines and devices: Secondary | ICD-10-CM | POA: Diagnosis not present

## 2022-06-07 DIAGNOSIS — Z560 Unemployment, unspecified: Secondary | ICD-10-CM

## 2022-06-07 DIAGNOSIS — L089 Local infection of the skin and subcutaneous tissue, unspecified: Secondary | ICD-10-CM | POA: Diagnosis not present

## 2022-06-07 DIAGNOSIS — E119 Type 2 diabetes mellitus without complications: Secondary | ICD-10-CM | POA: Diagnosis not present

## 2022-06-07 DIAGNOSIS — E1152 Type 2 diabetes mellitus with diabetic peripheral angiopathy with gangrene: Secondary | ICD-10-CM | POA: Diagnosis present

## 2022-06-07 DIAGNOSIS — J449 Chronic obstructive pulmonary disease, unspecified: Secondary | ICD-10-CM | POA: Diagnosis present

## 2022-06-07 DIAGNOSIS — I739 Peripheral vascular disease, unspecified: Secondary | ICD-10-CM | POA: Diagnosis not present

## 2022-06-07 DIAGNOSIS — E1165 Type 2 diabetes mellitus with hyperglycemia: Secondary | ICD-10-CM | POA: Diagnosis not present

## 2022-06-07 DIAGNOSIS — I708 Atherosclerosis of other arteries: Secondary | ICD-10-CM | POA: Diagnosis not present

## 2022-06-07 DIAGNOSIS — H9190 Unspecified hearing loss, unspecified ear: Secondary | ICD-10-CM | POA: Diagnosis present

## 2022-06-07 DIAGNOSIS — Z7982 Long term (current) use of aspirin: Secondary | ICD-10-CM | POA: Diagnosis not present

## 2022-06-07 DIAGNOSIS — L97519 Non-pressure chronic ulcer of other part of right foot with unspecified severity: Secondary | ICD-10-CM | POA: Diagnosis not present

## 2022-06-07 DIAGNOSIS — Z7984 Long term (current) use of oral hypoglycemic drugs: Secondary | ICD-10-CM

## 2022-06-07 DIAGNOSIS — G4733 Obstructive sleep apnea (adult) (pediatric): Secondary | ICD-10-CM | POA: Diagnosis present

## 2022-06-07 DIAGNOSIS — I5042 Chronic combined systolic (congestive) and diastolic (congestive) heart failure: Secondary | ICD-10-CM | POA: Diagnosis present

## 2022-06-07 DIAGNOSIS — M869 Osteomyelitis, unspecified: Secondary | ICD-10-CM | POA: Diagnosis not present

## 2022-06-07 DIAGNOSIS — Z794 Long term (current) use of insulin: Secondary | ICD-10-CM | POA: Diagnosis not present

## 2022-06-07 DIAGNOSIS — L97529 Non-pressure chronic ulcer of other part of left foot with unspecified severity: Secondary | ICD-10-CM | POA: Diagnosis present

## 2022-06-07 DIAGNOSIS — Z8249 Family history of ischemic heart disease and other diseases of the circulatory system: Secondary | ICD-10-CM

## 2022-06-07 DIAGNOSIS — L97514 Non-pressure chronic ulcer of other part of right foot with necrosis of bone: Secondary | ICD-10-CM

## 2022-06-07 DIAGNOSIS — M19041 Primary osteoarthritis, right hand: Secondary | ICD-10-CM | POA: Diagnosis present

## 2022-06-07 DIAGNOSIS — S91109A Unspecified open wound of unspecified toe(s) without damage to nail, initial encounter: Principal | ICD-10-CM

## 2022-06-07 DIAGNOSIS — K219 Gastro-esophageal reflux disease without esophagitis: Secondary | ICD-10-CM | POA: Diagnosis present

## 2022-06-07 DIAGNOSIS — M19042 Primary osteoarthritis, left hand: Secondary | ICD-10-CM | POA: Diagnosis present

## 2022-06-07 DIAGNOSIS — E11621 Type 2 diabetes mellitus with foot ulcer: Secondary | ICD-10-CM | POA: Diagnosis present

## 2022-06-07 DIAGNOSIS — I110 Hypertensive heart disease with heart failure: Secondary | ICD-10-CM | POA: Diagnosis present

## 2022-06-07 DIAGNOSIS — E1169 Type 2 diabetes mellitus with other specified complication: Secondary | ICD-10-CM | POA: Diagnosis present

## 2022-06-07 DIAGNOSIS — J9611 Chronic respiratory failure with hypoxia: Secondary | ICD-10-CM | POA: Diagnosis present

## 2022-06-07 DIAGNOSIS — K760 Fatty (change of) liver, not elsewhere classified: Secondary | ICD-10-CM | POA: Diagnosis present

## 2022-06-07 DIAGNOSIS — I70262 Atherosclerosis of native arteries of extremities with gangrene, left leg: Secondary | ICD-10-CM | POA: Diagnosis present

## 2022-06-07 DIAGNOSIS — Z9049 Acquired absence of other specified parts of digestive tract: Secondary | ICD-10-CM

## 2022-06-07 DIAGNOSIS — E11628 Type 2 diabetes mellitus with other skin complications: Secondary | ICD-10-CM | POA: Diagnosis present

## 2022-06-07 DIAGNOSIS — Z833 Family history of diabetes mellitus: Secondary | ICD-10-CM

## 2022-06-07 DIAGNOSIS — I5032 Chronic diastolic (congestive) heart failure: Secondary | ICD-10-CM | POA: Diagnosis present

## 2022-06-07 DIAGNOSIS — I10 Essential (primary) hypertension: Secondary | ICD-10-CM | POA: Diagnosis present

## 2022-06-07 DIAGNOSIS — M79671 Pain in right foot: Secondary | ICD-10-CM | POA: Diagnosis present

## 2022-06-07 DIAGNOSIS — Z79890 Hormone replacement therapy (postmenopausal): Secondary | ICD-10-CM

## 2022-06-07 HISTORY — DX: Local infection of the skin and subcutaneous tissue, unspecified: L08.9

## 2022-06-07 LAB — HEMOGLOBIN A1C
Hgb A1c MFr Bld: 7.4 % — ABNORMAL HIGH (ref 4.8–5.6)
Mean Plasma Glucose: 165.68 mg/dL

## 2022-06-07 LAB — CBC
HCT: 40.3 % (ref 39.0–52.0)
Hemoglobin: 12 g/dL — ABNORMAL LOW (ref 13.0–17.0)
MCH: 24.9 pg — ABNORMAL LOW (ref 26.0–34.0)
MCHC: 29.8 g/dL — ABNORMAL LOW (ref 30.0–36.0)
MCV: 83.8 fL (ref 80.0–100.0)
Platelets: 298 10*3/uL (ref 150–400)
RBC: 4.81 MIL/uL (ref 4.22–5.81)
RDW: 14.7 % (ref 11.5–15.5)
WBC: 10.5 10*3/uL (ref 4.0–10.5)
nRBC: 0 % (ref 0.0–0.2)

## 2022-06-07 LAB — C-REACTIVE PROTEIN: CRP: 4.6 mg/dL — ABNORMAL HIGH (ref <1.0)

## 2022-06-07 LAB — BASIC METABOLIC PANEL
Anion gap: 12 (ref 5–15)
BUN: 19 mg/dL (ref 6–20)
CO2: 26 mmol/L (ref 22–32)
Calcium: 8.6 mg/dL — ABNORMAL LOW (ref 8.9–10.3)
Chloride: 104 mmol/L (ref 98–111)
Creatinine, Ser: 1.16 mg/dL (ref 0.61–1.24)
GFR, Estimated: >60 mL/min (ref >60)
Glucose, Bld: 179 mg/dL — ABNORMAL HIGH (ref 70–99)
Potassium: 3.7 mmol/L (ref 3.5–5.1)
Sodium: 142 mmol/L (ref 135–145)

## 2022-06-07 LAB — GLUCOSE, CAPILLARY
Glucose-Capillary: 166 mg/dL — ABNORMAL HIGH (ref 70–99)
Glucose-Capillary: 168 mg/dL — ABNORMAL HIGH (ref 70–99)

## 2022-06-07 LAB — SEDIMENTATION RATE: Sed Rate: 14 mm/hr (ref 0–20)

## 2022-06-07 LAB — LACTIC ACID, PLASMA: Lactic Acid, Venous: 1.9 mmol/L (ref 0.5–1.9)

## 2022-06-07 MED ORDER — INSULIN GLARGINE-YFGN 100 UNIT/ML ~~LOC~~ SOLN
45.0000 [IU] | Freq: Every day | SUBCUTANEOUS | Status: DC
  Administered 2022-06-07 – 2022-06-08 (×2): 45 [IU] via SUBCUTANEOUS
  Filled 2022-06-07 (×3): qty 0.45

## 2022-06-07 MED ORDER — METRONIDAZOLE 500 MG/100ML IV SOLN
500.0000 mg | Freq: Two times a day (BID) | INTRAVENOUS | Status: DC
  Administered 2022-06-07: 500 mg via INTRAVENOUS
  Filled 2022-06-07: qty 100

## 2022-06-07 MED ORDER — ONDANSETRON HCL 4 MG/2ML IJ SOLN: 4.0000 mg | Freq: Four times a day (QID) | INTRAMUSCULAR | Status: DC | PRN

## 2022-06-07 MED ORDER — ACETAMINOPHEN 650 MG RE SUPP: 650.0000 mg | Freq: Four times a day (QID) | RECTAL | Status: DC | PRN

## 2022-06-07 MED ORDER — ONDANSETRON HCL 4 MG PO TABS: 4.0000 mg | ORAL_TABLET | Freq: Four times a day (QID) | ORAL | Status: DC | PRN

## 2022-06-07 MED ORDER — SODIUM CHLORIDE 0.9 % IV SOLN
2.0000 g | Freq: Once | INTRAVENOUS | Status: AC
  Administered 2022-06-07: 2 g via INTRAVENOUS
  Filled 2022-06-07: qty 12.5

## 2022-06-07 MED ORDER — SODIUM CHLORIDE 0.9% FLUSH
3.0000 mL | Freq: Two times a day (BID) | INTRAVENOUS | Status: DC
  Administered 2022-06-07 – 2022-06-09 (×4): 3 mL via INTRAVENOUS

## 2022-06-07 MED ORDER — VANCOMYCIN HCL 1500 MG/300ML IV SOLN
1500.0000 mg | Freq: Once | INTRAVENOUS | Status: AC
  Administered 2022-06-07: 1500 mg via INTRAVENOUS
  Filled 2022-06-07: qty 300

## 2022-06-07 MED ORDER — INSULIN ASPART 100 UNIT/ML IJ SOLN
0.0000 [IU] | Freq: Three times a day (TID) | INTRAMUSCULAR | Status: DC
  Administered 2022-06-07: 4 [IU] via SUBCUTANEOUS
  Administered 2022-06-09: 7 [IU] via SUBCUTANEOUS
  Administered 2022-06-09: 4 [IU] via SUBCUTANEOUS
  Filled 2022-06-07 (×3): qty 1

## 2022-06-07 MED ORDER — GABAPENTIN 300 MG PO CAPS
300.0000 mg | ORAL_CAPSULE | Freq: Three times a day (TID) | ORAL | Status: DC
  Administered 2022-06-07 – 2022-06-09 (×5): 300 mg via ORAL
  Filled 2022-06-07 (×5): qty 1

## 2022-06-07 MED ORDER — ADULT MULTIVITAMIN W/MINERALS CH
1.0000 | ORAL_TABLET | Freq: Every day | ORAL | Status: DC
  Administered 2022-06-07 – 2022-06-08 (×2): 1 via ORAL
  Filled 2022-06-07 (×2): qty 1

## 2022-06-07 MED ORDER — ASPIRIN 81 MG PO TBEC
81.0000 mg | DELAYED_RELEASE_TABLET | Freq: Every day | ORAL | Status: DC
  Administered 2022-06-07: 81 mg via ORAL
  Filled 2022-06-07: qty 1

## 2022-06-07 MED ORDER — PANTOPRAZOLE SODIUM 40 MG PO TBEC
40.0000 mg | DELAYED_RELEASE_TABLET | Freq: Every day | ORAL | Status: DC
  Administered 2022-06-07 – 2022-06-08 (×2): 40 mg via ORAL
  Filled 2022-06-07 (×2): qty 1

## 2022-06-07 MED ORDER — ATORVASTATIN CALCIUM 20 MG PO TABS
40.0000 mg | ORAL_TABLET | Freq: Every day | ORAL | Status: DC
  Administered 2022-06-07 – 2022-06-08 (×2): 40 mg via ORAL
  Filled 2022-06-07 (×2): qty 2

## 2022-06-07 MED ORDER — GADOBUTROL 1 MMOL/ML IV SOLN
10.0000 mL | Freq: Once | INTRAVENOUS | Status: AC | PRN
  Administered 2022-06-07: 10 mL via INTRAVENOUS

## 2022-06-07 MED ORDER — TORSEMIDE 20 MG PO TABS
40.0000 mg | ORAL_TABLET | Freq: Every day | ORAL | Status: DC
  Administered 2022-06-08: 40 mg via ORAL
  Filled 2022-06-07: qty 2

## 2022-06-07 MED ORDER — ACETAMINOPHEN 325 MG PO TABS: 650.0000 mg | ORAL_TABLET | Freq: Four times a day (QID) | ORAL | Status: DC | PRN

## 2022-06-07 NOTE — ED Notes (Signed)
Report given to Shanda Bumps, Charity fundraiser . Pt to go to room 33 after MRI. MRI tech aware. Pt stable at time of departure.

## 2022-06-07 NOTE — TOC CM/SW Note (Signed)
CSW acknowledges consult for home health/DME needs and medication assistance. Patient has insurance so he is unable to get medications through Medication Management program. Sent secure chat to MD requesting PT and OT consults so patient can be evaluated for disposition needs.  Charlynn Court, CSW [phone]

## 2022-06-07 NOTE — Progress Notes (Signed)
Subjective:  [name], male    DOB: [birth year],  MRN: [id number]  Chief Complaint  Patient presents with   Nail Problem    Nail trim     57 y.o. male presents with the above complaint.  Patient presents with right second digit ulceration.  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  Patient states that he lost those many years ago.  Due to infection and diabetes he is worried about the second toe he denies any other acute complaints.  He has not seen anyone else prior to seeing me he denies any nausea fever chills vomiting.   Review of Systems: Negative except as noted in the HPI. Denies N/V/F/Ch.  Past Medical History:  Diagnosis Date   Arthritis    hands   Cellulitis    CHF (congestive heart failure) (HCC)    Chronic diastolic heart failure (HCC)    Chronic respiratory failure with hypoxia (HCC)    2 L [place] continuously   Diabetes mellitus type 2, insulin dependent (HCC)    Edema    FEET/LEGS   GERD (gastroesophageal reflux disease)    Hepatic steatosis    HOH (hard of hearing)    Hypertension    Morbid obesity (HCC)    Morbid obesity with BMI of 45.0-49.9, adult (HCC)    Neuropathy    Orthopnea    OSA on CPAP    TRILOGY VENTILATOR   Oxygen deficiency    2L  HS   PVD (peripheral vascular disease) (HCC)    Shortness of breath dyspnea    Systolic heart failure (HCC)    Preserved EF 50-55%    Current Outpatient Medications:    ACCU-CHEK SMARTVIEW test strip, , Disp: , Rfl:    aspirin EC 81 MG tablet, Take 81 mg by mouth daily., Disp: , Rfl:    atorvastatin (LIPITOR) 40 MG tablet, atorvastatin 40 mg tablet, Disp: , Rfl:    carvedilol (COREG) 6.25 MG tablet, Take 1 tablet (6.25 mg total) by mouth 2 (two) times daily., Disp: 60 tablet, Rfl: 11   Empagliflozin (JARDIANCE PO), Take by mouth daily., Disp: , Rfl:    gabapentin (NEURONTIN) 300  MG capsule, Take 300 mg by mouth 3 (three) times daily., Disp: , Rfl:    HYDROcodone-acetaminophen (NORCO/VICODIN) 5-325 MG tablet, Take 1 tablet by mouth every 6 (six) hours as needed for severe pain., Disp: 8 tablet, Rfl: 0   insulin detemir (LEVEMIR) 100 UNIT/ML injection, Inject 89 Units into the skin 2 (two) times daily. , Disp: , Rfl:    insulin lispro (HUMALOG) 100 UNIT/ML cartridge, Inject 60 Units into the skin 3 (three) times daily with meals., Disp: , Rfl:    liraglutide (VICTOZA) 18 MG/3ML SOPN, Inject into the skin daily., Disp: , Rfl:    losartan (COZAAR) 50 MG tablet, Take 50 mg by mouth daily., Disp: , Rfl:    metFORMIN (GLUCOPHAGE) 1000 MG tablet, Take 1,000 mg by mouth 2 (two) times daily with a meal., Disp: , Rfl:    Multiple Vitamins-Minerals (CENTRUM SILVER 50+MEN) TABS, Take 1 tablet by mouth daily., Disp: , Rfl:    omega-3 acid ethyl esters (LOVAZA) 1 g capsule, Take 1 g by mouth daily., Disp: , Rfl:    OXYGEN, Inhale 2 L into the lungs daily., Disp: , Rfl:    pantoprazole (PROTONIX) 40 MG tablet, Take 40 mg  by mouth daily., Disp: , Rfl:    torsemide (DEMADEX) 20 MG tablet, Take 2 tablets (40 mg total) by mouth daily., Disp: 60 tablet, Rfl: 2   TRUEPLUS INSULIN SYRINGE 29G X 1/2" 1 ML MISC, , Disp: , Rfl:    TRUEPLUS PEN NEEDLES 31G X 8 MM MISC, , Disp: , Rfl:   Social History   Tobacco Use  Smoking Status Never  Smokeless Tobacco Never    No Known Allergies Objective:  There were no vitals filed for this visit. There is no height or weight on file to calculate BMI. Constitutional Well developed. Well nourished.  Vascular Dorsalis pedis pulses nonpalpable bilaterally. Posterior tibial pulses nonpalpable bilaterally. Capillary refill normal to all digits.  No cyanosis or clubbing noted. Pedal hair growth normal.  Neurologic Normal speech. Oriented to person, place, and time. Epicritic sensation to light touch grossly present bilaterally.  Dermatologic Nails  well groomed and normal in appearance. Open wound noted to right second digit with probing down to bone exposure of bone.  No purulent drainage noted edema noted around the toe.  Some malodor present  Orthopedic: Hammertoe contracture noted of rest of the remaining toes     Radiographs: Office x-ray is not in service today.  Patient will defer to emergency room for x-ray Assessment:   1. Acute osteomyelitis of toe, right (HCC)   2. Ulcer of great toe, right, with necrosis of bone (HCC)   3. Uncontrolled type 2 diabetes mellitus with hyperglycemia (HCC)    Plan:  Patient was evaluated and treated and all questions answered.  Right second digit ulceration with exposure of bone -All questions and concerns were discussed with the patient in extensive detail -Given the nature of the toe and a history of previous amputation patient will need a second digit amputation to the right foot.  I discussed this with patient he states understanding. -He will also need new set of ABIs PVRs and possible vascular involvement given his previous ABIs PVRs -Local wound care with Betadine wet-to-dry dressing -Patient will be sent to the emergency room to be admitted for IV antibiotics MRI and vascular workup prior to amputation of the right second digit -He is a high risk of worsening amputation and loss of leg.  I discussed with patient he states understanding  No follow-ups on file.

## 2022-06-07 NOTE — Consult Note (Signed)
PHARMACY -  BRIEF ANTIBIOTIC NOTE   Pharmacy has received consult(s) for cefepime and vancomycin dosing from an ED provider.  The patient's profile has been reviewed for ht/wt/allergies/indication/available labs.    One time order(s) placed for cefepime 2 grams IV x 1 and vancomycin 1500 mg IV x 1  Further antibiotics/pharmacy consults should be ordered by admitting physician if indicated.                       Thank you, Barrie Folk, PharmD 06/07/2022  12:11 PM

## 2022-06-07 NOTE — ED Provider Notes (Signed)
[facility] Provider Note    Event Date/Time   First MD Initiated Contact with Patient 06/07/22 1120     (approximate)   History   Foot Pain   HPI  Raymond [name] is a 57 y.o. male past medical history significant for peripheral vascular disease, obesity, diabetes, history of toe amputations, who presents to the emergency department from podiatry for toe infection.  Patient went today to podiatrist for a wound on his foot and was told to come to the emergency department for IV antibiotics given concern for possible osteomyelitis.  Denies any pain at this time.  No fever or chills.  States that his glucose has been well-controlled.  No falls or trauma.  No allergies.  Recently on ciprofloxacin for antibiotics.  Patient has been followed by the wound care clinic.     Physical Exam   Triage Vital Signs: ED Triage Vitals [06/07/22 1114]  Enc Vitals Group     BP (!) 159/69     Pulse Rate 80     Resp 20     Temp 98 F (36.7 C)     Temp src      SpO2 91 %     Weight      Height      Head Circumference      Peak Flow      Pain Score 0     Pain Loc      Pain Edu?      Excl. in GC?     Most recent vital signs: Vitals:   06/07/22 1114  BP: (!) 159/69  Pulse: 80  Resp: 20  Temp: 98 F (36.7 C)  SpO2: 91%    Physical Exam Constitutional:      Appearance: He is well-developed.  HENT:     Head: Atraumatic.  Eyes:     Conjunctiva/sclera: Conjunctivae normal.  Cardiovascular:     Rate and Rhythm: Regular rhythm.  Pulmonary:     Effort: No respiratory distress.  Musculoskeletal:     Cervical back: Normal range of motion.     Comments: Doppler pulse to the right lower extremity.  Palpable pulse to the left lower extremity  Skin:    General: Skin is warm.  Neurological:     Mental Status: He is alert. Mental status is at baseline.      IMPRESSION / MDM / ASSESSMENT AND PLAN / ED COURSE  I reviewed the triage vital signs and  the nursing notes.  Differential diagnosis including diabetic foot wound, osteomyelitis, sepsis, cellulitis   RADIOLOGY I independently reviewed imaging, my interpretation of imaging: X-ray of the right great toe   Labs (all labs ordered are listed, but only abnormal results are displayed) Labs interpreted as -    Labs Reviewed  CBC - Abnormal; Notable for the following components:      Result Value   Hemoglobin 12.0 (*)    MCH 24.9 (*)    MCHC 29.8 (*)    All other components within normal limits  BASIC METABOLIC PANEL - Abnormal; Notable for the following components:   Glucose, Bld 179 (*)    Calcium 8.6 (*)    All other components within normal limits  LACTIC ACID, PLASMA  LACTIC ACID, PLASMA  SEDIMENTATION RATE  C-REACTIVE PROTEIN    No significant leukocytosis.  Added on ESR and CRP.  Will obtain an x-ray of the right foot.  On chart review patient was evaluated by [facility] earlier  today by Dr. Allena Katz, recommended coming over to the emergency department with plans of possible amputation.  Recommended IV antibiotics, MRI and vascular workup.  Consulted hospitalist for admission.  Ordered IV antibiotics to cover MRSA, Pseudomonas.      PROCEDURES:  Critical Care performed: No  Procedures  Patient's presentation is most consistent with acute presentation with potential threat to life or bodily function.   MEDICATIONS ORDERED IN ED: Medications  metroNIDAZOLE (FLAGYL) IVPB 500 mg (has no administration in time range)    FINAL CLINICAL IMPRESSION(S) / ED DIAGNOSES   Final diagnoses:  Open wound of toe, initial encounter  Osteomyelitis of right foot, unspecified type (HCC)     Rx / DC Orders   ED Discharge Orders     None        Note:  This document was prepared using Dragon voice recognition software and may include unintentional dictation errors.   Corena Herter, MD 06/07/22 1209

## 2022-06-07 NOTE — Assessment & Plan Note (Addendum)
-   Per pharmacy records, patient has not picked up carvedilol or losartan in the last several months.  Will restart if necessary

## 2022-06-07 NOTE — Assessment & Plan Note (Addendum)
Patient appears relatively usual hemic at this time with trivial bilateral lower extremity edema.  No indication for IV diuresis.  -Continue home torsemide

## 2022-06-07 NOTE — Assessment & Plan Note (Signed)
-   BiPAP at bedtime 

## 2022-06-07 NOTE — ED Triage Notes (Signed)
Pt comes with c/o right toe infection. Pt sent over for possible amputation of toe. Pt states they took toenail off and it was black all under it.   Pt is diabetic. Pt states this has been going on for awhile.

## 2022-06-07 NOTE — Assessment & Plan Note (Signed)
History of morbid obesity complicated by type 2 diabetes, PAD.

## 2022-06-07 NOTE — Assessment & Plan Note (Signed)
History of PAD with recurrent wounds.  Will update ABIs and consult vascular surgery if necessary.  - ABIs ordered - Continue statin and aspirin

## 2022-06-07 NOTE — Assessment & Plan Note (Signed)
History of chronic bilateral diabetic foot ulcers with acute ulcer development on the distal tip of the right second digit and dorsal aspect.  - Podiatry consulted; appreciate their recommendations - Per podiatry's notes, amputation is being considered - MRI with and without contrast pending - ABIs ordered as noted below - S/p vancomycin, cefepime and metronidazole in the ED.  Given clinical stability, will hold off on further antibiotics until bone biopsies are obtained - N.p.o. for now

## 2022-06-07 NOTE — Assessment & Plan Note (Signed)
Patient no longer requires oxygen.  He denies any shortness of breath or wheezing.  Examination is reassuring.  - DuoNebs as needed

## 2022-06-07 NOTE — H&P (Addendum)
History and Physical    Patient: Raymond [name] DOB: May 30, 1965 DOA: 06/07/2022 DOS: the patient was seen and examined on 06/07/2022 PCP: Martie Round, NP  Patient coming from: Home  Chief Complaint:  Chief Complaint  Patient presents with   Foot Pain   HPI: Raymond [name] is a 57 y.o. male with medical history significant of hypertension, type 2 diabetes, HFpEF, COPD with chronic hypoxic respiratory failure on 2 L, OSA on CPAP, hyperlipidemia, cardiomegaly, who presents to the ED due to foot pain.  Raymond [name] states that he has been dealing with bilateral lower extremity wounds for an extended period of time now, however his right second digit developed a new ulcer on the top approximately 1 week ago.  He has not noticed any drainage other than intermittent bleeding.  He denies any redness or warmth.  He denies any fever, chills, nausea, vomiting, diarrhea, abdominal pain, chest pain, palpitations or shortness of breath.  His podiatrist recommended he come in today for further evaluation.  ED course: On arrival to the ED, patient was hypertensive at 159/69 with heart rate of 80.  He was saturating at 91%.  He was afebrile at 98.  Initial workup notable for WBC of 10.5, hemoglobin of 12.0, glucose of 179, creatinine 1.16 with GFR above 60.  Lactic acid within normal limits at 1.9.  X-ray of the right foot was obtained with no evidence of osteomyelitis.  Podiatry consulted and requesting IV antibiotics be initiated.  [facility] contacted for admission.  Review of Systems: As mentioned in the history of present illness. All other systems reviewed and are negative.  Past Medical History:  Diagnosis Date   Arthritis    hands   Cellulitis    CHF (congestive heart failure) (HCC)    Chronic diastolic heart failure (HCC)    Chronic respiratory failure with hypoxia (HCC)    2 L [place] continuously   Diabetes mellitus type 2, insulin dependent (HCC)    Edema     FEET/LEGS   GERD (gastroesophageal reflux disease)    Hepatic steatosis    HOH (hard of hearing)    Hypertension    Morbid obesity (HCC)    Morbid obesity with BMI of 45.0-49.9, adult (HCC)    Neuropathy    Orthopnea    OSA on CPAP    TRILOGY VENTILATOR   Oxygen deficiency    2L  HS   PVD (peripheral vascular disease) (HCC)    Shortness of breath dyspnea    Systolic heart failure (HCC)    Preserved EF 50-55%   Past Surgical History:  Procedure Laterality Date   AMPUTATION Right 10/02/2014   Procedure: AMPUTATION RAY;  Surgeon: Recardo Evangelist, DPM;  Location: [facility] ORS;  Service: Podiatry;  Laterality: Right;   CATARACT EXTRACTION W/PHACO Right 09/10/2015   Procedure: CATARACT EXTRACTION PHACO AND INTRAOCULAR LENS PLACEMENT (IOC);  Surgeon: Galen Manila, MD;  Location: [facility] ORS;  Service: Ophthalmology;  Laterality: Right;  [place] 01:34 AP% 19.3 CDE 18.36 Fluid pack lot # [id number]   CATARACT EXTRACTION W/PHACO Left 10/06/2015   Procedure: CATARACT EXTRACTION PHACO AND INTRAOCULAR LENS PLACEMENT (IOC);  Surgeon: Galen Manila, MD;  Location: [facility] ORS;  Service: Ophthalmology;  Laterality: Left;  [place] 00:56 AP% 19.5 CDE 11.02 Fluid Pack # [id number]   CHOLECYSTECTOMY     COLONOSCOPY WITH PROPOFOL N/A 05/17/2016   Procedure: COLONOSCOPY WITH PROPOFOL;  Surgeon: Christena Deem, MD;  Location: [facility] ENDOSCOPY;  Service: Endoscopy;  Laterality: N/A;  COLONOSCOPY WITH PROPOFOL N/A 12/27/2021   Procedure: COLONOSCOPY WITH PROPOFOL;  Surgeon: Wyline Mood, MD;  Location: [facility] ENDOSCOPY;  Service: Gastroenterology;  Laterality: N/A;   EYE SURGERY Left    bilat laser   HIP SURGERY Right    8 th grade pins   NOSE SURGERY     PERIPHERAL VASCULAR CATHETERIZATION Right 10/02/2014   Procedure: Lower Extremity Angiography;  Surgeon: Annice Needy, MD;  Location: [facility] INVASIVE CV LAB;  Service: Cardiovascular;  Laterality: Right;   TOE AMPUTATION Left 2010   2cd   TOE AMPUTATION Right 2009    Social History:  reports that he has never smoked. He has never used smokeless tobacco. He reports that he does not drink alcohol and does not use drugs.  No Known Allergies  Family History  Problem Relation Age of Onset   Hypertension Mother    Diabetes Mother    Stroke Father    Hypertension Father    Heart attack Father     Prior to Admission medications   Medication Sig Start Date End Date Taking? Authorizing Provider  ACCU-CHEK SMARTVIEW test strip  02/06/18   [provider]  aspirin EC 81 MG tablet Take 81 mg by mouth daily.    [provider]  atorvastatin (LIPITOR) 40 MG tablet atorvastatin 40 mg tablet    [provider]  carvedilol (COREG) 6.25 MG tablet Take 1 tablet (6.25 mg total) by mouth 2 (two) times daily. 04/30/17 04/30/18  Rai, Delene Ruffini, MD  Empagliflozin (JARDIANCE PO) Take by mouth daily.    [provider]  gabapentin (NEURONTIN) 300 MG capsule Take 300 mg by mouth 3 (three) times daily.    [provider]  HYDROcodone-acetaminophen (NORCO/VICODIN) 5-325 MG tablet Take 1 tablet by mouth every 6 (six) hours as needed for severe pain. 01/31/22 01/31/23  Shaune Pollack, MD  insulin detemir (LEVEMIR) 100 UNIT/ML injection Inject 89 Units into the skin 2 (two) times daily.     [provider]  insulin lispro (HUMALOG) 100 UNIT/ML cartridge Inject 60 Units into the skin 3 (three) times daily with meals.    [provider]  liraglutide (VICTOZA) 18 MG/3ML SOPN Inject into the skin daily.    [provider]  losartan (COZAAR) 50 MG tablet Take 50 mg by mouth daily.    [provider]  metFORMIN (GLUCOPHAGE) 1000 MG tablet Take 1,000 mg by mouth 2 (two) times daily with a meal.    [provider]  Multiple Vitamins-Minerals (CENTRUM SILVER 50+MEN) TABS Take 1 tablet by mouth daily.    [provider]  omega-3 acid ethyl esters (LOVAZA) 1 g capsule Take 1 g by mouth daily.     [provider]  OXYGEN Inhale 2 L into the lungs daily.    [provider]  pantoprazole (PROTONIX) 40 MG tablet Take 40 mg by mouth daily.    [provider]  torsemide (DEMADEX) 20 MG tablet Take 2 tablets (40 mg total) by mouth daily. 05/01/17   Rai, Delene Ruffini, MD  TRUEPLUS INSULIN SYRINGE 29G X 1/2" 1 ML MISC  02/16/18   [provider]  TRUEPLUS PEN NEEDLES 31G X 8 MM MISC  02/06/18   [provider]    Physical Exam: Vitals:   06/07/22 1114  BP: (!) 159/69  Pulse: 80  Resp: 20  Temp: 98 F (36.7 C)  SpO2: 91%   Physical Exam Vitals and nursing note reviewed.  Constitutional:  General: He is not in acute distress.    Appearance: He is obese. He is not toxic-appearing.  HENT:     Head: Normocephalic and atraumatic.     Mouth/Throat:     Mouth: Mucous membranes are moist.     Pharynx: Oropharynx is clear.  Cardiovascular:     Rate and Rhythm: Normal rate and regular rhythm.     Heart sounds: No murmur heard.    No gallop.  Pulmonary:     Effort: Pulmonary effort is normal. No respiratory distress.     Breath sounds: Normal breath sounds. No wheezing, rhonchi or rales.  Musculoskeletal:     Comments: Trivial bilateral pitting edema  Skin:    General: Skin is warm and dry.     Comments: On the right second digit, there is approximately 1 cm ulceration on the dorsal aspect with no surrounding skin changes, however some swelling noted.  No purulent drainage noted or increased warmth to touch.  His toe nail on this digit has been removed with surrounding ulceration that has not not healed.  No purulent drainage here either.  Neurological:     Mental Status: He is alert and oriented to person, place, and time. Mental status is at baseline.  Psychiatric:        Mood and Affect: Mood normal.        Behavior: Behavior normal.      Data Reviewed: CBC with WBC of 10.5, hemoglobin 12.0, MCV 83.8, platelets of 298. BMP with sodium  142, potassium 3.7, bicarb 26, glucose 179, BUN 19, creatinine 1.6 with GFR above 60 Lactic acid within normal limits at 1.9  DG Foot 2 Views Right  Result Date: 06/07/2022 CLINICAL DATA:  Right foot infection.  Open wound second toe EXAM: RIGHT FOOT - 2 VIEW COMPARISON:  X-ray 09/30/2014 FINDINGS: Evidence of prior amputation involving the phalanges and metatarsal head of the first ray. Sclerotic margin. No acute fracture or dislocation. Preserved bone mineralization. Scattered vascular calcifications. There is soft tissue swelling of the second digit. Phalanges are flexed on these views but no obvious erosive changes. If there is further concern of bone infection, bone scan or MRI may be of some benefit for further sensitivity. Global soft tissue swelling of the midfoot as well. IMPRESSION: Previous first ray amputation. Soft tissue swelling of the foot and second digit. No definite erosive changes at this time by x-ray Electronically Signed   By: Karen Kays M.D.   On: 06/07/2022 12:25    Results are pending, will review when available.  Assessment and Plan:  * Diabetic foot infection (HCC) History of chronic bilateral diabetic foot ulcers with acute ulcer development on the distal tip of the right second digit and dorsal aspect.  - Podiatry consulted; appreciate their recommendations - Per podiatry's notes, amputation is being considered - MRI with and without contrast pending - ABIs ordered as noted below - S/p vancomycin, cefepime and metronidazole in the ED.  Given clinical stability, will hold off on further antibiotics until bone biopsies are obtained - N.p.o. for now  PVD (peripheral vascular disease) (HCC) History of PAD with recurrent wounds.  Will update ABIs and consult vascular surgery if necessary.  - ABIs ordered - Continue statin and aspirin  Diabetes mellitus type 2, insulin dependent (HCC) - A1c pending - SSI, resistant - Semglee 45 units at bedtime - Hold home  insulin, Victoza, metformin, and Jardiance  HTN (hypertension) - Per pharmacy records, patient has not picked up carvedilol  or losartan in the last several months.  Will restart if necessary  COPD, moderate (HCC) Patient no longer requires oxygen.  He denies any shortness of breath or wheezing.  Examination is reassuring.  - DuoNebs as needed  Chronic diastolic heart failure (HCC) Patient appears relatively usual hemic at this time with trivial bilateral lower extremity edema.  No indication for IV diuresis.  -Continue home torsemide  Morbid obesity (HCC) History of morbid obesity complicated by type 2 diabetes, PAD.  OSA treated with Trilogy machine - BiPAP at bedtime  Advance Care Planning:   Code Status: Full Code verified by patient  Consults: Podiatry  Family Communication: No family at bed side  Severity of Illness: The appropriate patient status for this patient is INPATIENT. Inpatient status is judged to be reasonable and necessary in order to provide the required intensity of service to ensure the patient's safety. The patient's presenting symptoms, physical exam findings, and initial radiographic and laboratory data in the context of their chronic comorbidities is felt to place them at high risk for further clinical deterioration. Furthermore, it is not anticipated that the patient will be medically stable for discharge from the hospital within 2 midnights of admission.   * I certify that at the point of admission it is my clinical judgment that the patient will require inpatient hospital care spanning beyond 2 midnights from the point of admission due to high intensity of service, high risk for further deterioration and high frequency of surveillance required.*  Author: Verdene Lennert, MD 06/07/2022 2:14 PM  For on call review [email or web address].

## 2022-06-07 NOTE — Assessment & Plan Note (Addendum)
-   A1c pending - SSI, resistant - Semglee 45 units at bedtime - Hold home insulin, Victoza, metformin, and Jardiance

## 2022-06-07 NOTE — Consult Note (Signed)
PODIATRY CONSULTATION  NAME Raymond [name] MRN [id number] DOB 03/01/1965 DOA 06/07/2022   Reason for consult:  Chief Complaint  Patient presents with   Foot Pain    Consulting physician: Corena Herter MD, [facility]  History of present illness: 57 y.o. male PMHx T2DM, HTN, COPD, cardiomegaly, morbid obesity, admitted for worsening infection right second toe.  Seen earlier this a.m. in the podiatry clinic and recommended that the patient goes to the ED for prompt management of the worsening infection to the right second toe.  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  Seen at bedside this evening resting comfortably  Past Medical History:  Diagnosis Date   Arthritis    hands   Cellulitis    CHF (congestive heart failure) (HCC)    Chronic diastolic heart failure (HCC)    Chronic respiratory failure with hypoxia (HCC)    2 L [place] continuously   Diabetes mellitus type 2, insulin dependent (HCC)    Edema    FEET/LEGS   GERD (gastroesophageal reflux disease)    Hepatic steatosis    HOH (hard of hearing)    Hypertension    Morbid obesity (HCC)    Morbid obesity with BMI of 45.0-49.9, adult (HCC)    Neuropathy    Orthopnea    OSA on CPAP    TRILOGY VENTILATOR   Oxygen deficiency    2L  HS   PVD (peripheral vascular disease) (HCC)    Shortness of breath dyspnea    Systolic heart failure (HCC)    Preserved EF 50-55%       Latest Ref Rng & Units 06/07/2022   11:15 AM 01/31/2022    3:09 PM 02/12/2020    1:26 AM  CBC  WBC 4.0 - 10.5 K/uL 10.5  9.6  11.5   Hemoglobin 13.0 - 17.0 g/dL 40.9  81.1  91.4   Hematocrit 39.0 - 52.0 % 40.3  41.4  35.9   Platelets 150 - 400 K/uL 298  268  364        Latest Ref Rng & Units 06/07/2022   11:15 AM 01/31/2022    3:09 PM 02/20/2020    6:26 AM  BMP  Glucose 70 - 99 mg/dL 782  64  956   BUN 6 - 20 mg/dL 19  22  48   Creatinine 0.61 - 1.24 mg/dL 2.13  0.86  5.78    Sodium 135 - 145 mmol/L 142  142  135   Potassium 3.5 - 5.1 mmol/L 3.7  3.9  4.2   Chloride 98 - 111 mmol/L 104  105  95   CO2 22 - 32 mmol/L 26  29  29    Calcium 8.9 - 10.3 mg/dL 8.6  8.8  9.2       Physical Exam: General: The patient is alert and oriented x3 in no acute distress.   Dermatology: Ulcer overlying the PIP second digit right foot as well as the distal tuft of the toe.  Bone exposed.  Vascular: US ARTERIAL ABI pending VAS [place] ABI W/WO TBI 01/19/2022  Summary Right: Resting right ankle-brachial index indicates Noncompressible right lower extremity arteries Left: Resting left ankle-brachial index is within normal range.  Neurological: Light touch and protective threshold absent  Musculoskeletal Exam: Prior amputations LT great toe and second toe.  RT great toe  MR TOES RIGHT W/WO CONTRAST 06/07/2022: IMPRESSION: 1. Small ulceration  along the dorsal aspect of the second proximal phalanx head with underlying osteomyelitis of the second proximal phalanx head, second middle, and second distal phalanges. 2. Second toe cellulitis without abscess.  ASSESSMENT/PLAN OF CARE Osteomyelitis right second toe with overlying ulcer secondary to diabetes mellitus 2.   Peripheral vascular disease  -Current ABIs pending.  Given the patient's history of peripheral vascular disease recommend vascular consult.  Toe amputation not emergent.  Will plan to wait until the patient has been optimized from a vascular standpoint prior to toe amputation surgery -If vascular has no plans for intervention, will plan for right second toe amputation tomorrow, 06/08/2022.  This was all discussed in detail with the patient and he understands and agrees with the plan of care -Preoperative orders placed.  N.p.o. after midnight. -Continue IV ABX  -Podiatry will continue to follow    Thank you for the consult.  Please contact me directly via secure chat with any questions or concerns.     Felecia Shelling,  DPM [facility]  Dr. Felecia Shelling, DPM    [address] [place], [place] [address]                Office [phone]  Fax [phone]

## 2022-06-08 ENCOUNTER — Encounter: Payer: Self-pay | Admitting: Vascular Surgery

## 2022-06-08 ENCOUNTER — Encounter
Admission: EM | Disposition: A | Discharge status: Home / Self Care | Payer: Self-pay | Source: Home / Self Care | Attending: Internal Medicine

## 2022-06-08 DIAGNOSIS — J9611 Chronic respiratory failure with hypoxia: Secondary | ICD-10-CM

## 2022-06-08 DIAGNOSIS — I70235 Atherosclerosis of native arteries of right leg with ulceration of other part of foot: Secondary | ICD-10-CM | POA: Diagnosis not present

## 2022-06-08 DIAGNOSIS — M869 Osteomyelitis, unspecified: Secondary | ICD-10-CM | POA: Diagnosis not present

## 2022-06-08 DIAGNOSIS — G4733 Obstructive sleep apnea (adult) (pediatric): Secondary | ICD-10-CM

## 2022-06-08 DIAGNOSIS — M79671 Pain in right foot: Secondary | ICD-10-CM | POA: Diagnosis not present

## 2022-06-08 DIAGNOSIS — I5030 Unspecified diastolic (congestive) heart failure: Secondary | ICD-10-CM

## 2022-06-08 DIAGNOSIS — Z9989 Dependence on other enabling machines and devices: Secondary | ICD-10-CM

## 2022-06-08 DIAGNOSIS — I110 Hypertensive heart disease with heart failure: Secondary | ICD-10-CM

## 2022-06-08 DIAGNOSIS — I708 Atherosclerosis of other arteries: Secondary | ICD-10-CM | POA: Diagnosis not present

## 2022-06-08 DIAGNOSIS — Z9981 Dependence on supplemental oxygen: Secondary | ICD-10-CM

## 2022-06-08 DIAGNOSIS — L97519 Non-pressure chronic ulcer of other part of right foot with unspecified severity: Secondary | ICD-10-CM

## 2022-06-08 DIAGNOSIS — E11628 Type 2 diabetes mellitus with other skin complications: Secondary | ICD-10-CM | POA: Diagnosis not present

## 2022-06-08 DIAGNOSIS — E11621 Type 2 diabetes mellitus with foot ulcer: Secondary | ICD-10-CM

## 2022-06-08 DIAGNOSIS — E785 Hyperlipidemia, unspecified: Secondary | ICD-10-CM

## 2022-06-08 DIAGNOSIS — L089 Local infection of the skin and subcutaneous tissue, unspecified: Secondary | ICD-10-CM | POA: Diagnosis not present

## 2022-06-08 DIAGNOSIS — Z794 Long term (current) use of insulin: Secondary | ICD-10-CM

## 2022-06-08 HISTORY — PX: LOWER EXTREMITY ANGIOGRAPHY: CATH118251

## 2022-06-08 LAB — GLUCOSE, CAPILLARY
Glucose-Capillary: 104 mg/dL — ABNORMAL HIGH (ref 70–99)
Glucose-Capillary: 108 mg/dL — ABNORMAL HIGH (ref 70–99)
Glucose-Capillary: 123 mg/dL — ABNORMAL HIGH (ref 70–99)
Glucose-Capillary: 109 mg/dL — ABNORMAL HIGH (ref 70–99)
Glucose-Capillary: 136 mg/dL — ABNORMAL HIGH (ref 70–99)

## 2022-06-08 LAB — CBC
HCT: 37.3 % — ABNORMAL LOW (ref 39.0–52.0)
Hemoglobin: 11.5 g/dL — ABNORMAL LOW (ref 13.0–17.0)
MCH: 25.2 pg — ABNORMAL LOW (ref 26.0–34.0)
MCHC: 30.8 g/dL (ref 30.0–36.0)
MCV: 81.8 fL (ref 80.0–100.0)
Platelets: 293 10*3/uL (ref 150–400)
RBC: 4.56 MIL/uL (ref 4.22–5.81)
RDW: 14.8 % (ref 11.5–15.5)
WBC: 7.7 10*3/uL (ref 4.0–10.5)
nRBC: 0 % (ref 0.0–0.2)

## 2022-06-08 LAB — HIV ANTIBODY (ROUTINE TESTING W REFLEX): HIV Screen 4th Generation wRfx: NONREACTIVE

## 2022-06-08 LAB — BASIC METABOLIC PANEL
Anion gap: 6 (ref 5–15)
BUN: 17 mg/dL (ref 6–20)
CO2: 30 mmol/L (ref 22–32)
Calcium: 8.5 mg/dL — ABNORMAL LOW (ref 8.9–10.3)
Chloride: 109 mmol/L (ref 98–111)
Creatinine, Ser: 0.97 mg/dL (ref 0.61–1.24)
GFR, Estimated: >60 mL/min (ref >60)
Glucose, Bld: 112 mg/dL — ABNORMAL HIGH (ref 70–99)
Potassium: 3.9 mmol/L (ref 3.5–5.1)
Sodium: 145 mmol/L (ref 135–145)

## 2022-06-08 SURGERY — LOWER EXTREMITY ANGIOGRAPHY
Anesthesia: Moderate Sedation | Laterality: Right

## 2022-06-08 MED ORDER — SODIUM CHLORIDE 0.9 % IV SOLN: INTRAVENOUS | Status: AC

## 2022-06-08 MED ORDER — CEFAZOLIN SODIUM-DEXTROSE 2-4 GM/100ML-% IV SOLN
2.0000 g | INTRAVENOUS | Status: AC
  Administered 2022-06-08: 2 g via INTRAVENOUS

## 2022-06-08 MED ORDER — HEPARIN SODIUM (PORCINE) 1000 UNIT/ML IJ SOLN
INTRAMUSCULAR | Status: AC
  Filled 2022-06-08: qty 10

## 2022-06-08 MED ORDER — ACETAMINOPHEN 325 MG PO TABS: 650.0000 mg | ORAL_TABLET | ORAL | Status: DC | PRN

## 2022-06-08 MED ORDER — SODIUM CHLORIDE 0.9% FLUSH: 3.0000 mL | INTRAVENOUS | Status: DC | PRN

## 2022-06-08 MED ORDER — OXYCODONE HCL 5 MG PO TABS: 5.0000 mg | ORAL_TABLET | ORAL | Status: DC | PRN

## 2022-06-08 MED ORDER — FENTANYL CITRATE (PF) 100 MCG/2ML IJ SOLN
INTRAMUSCULAR | Status: AC
  Filled 2022-06-08: qty 2

## 2022-06-08 MED ORDER — MIDAZOLAM HCL 2 MG/ML PO SYRP: 8.0000 mg | ORAL_SOLUTION | Freq: Once | ORAL | Status: DC | PRN

## 2022-06-08 MED ORDER — ZINC SULFATE 220 (50 ZN) MG PO CAPS: 220.0000 mg | ORAL_CAPSULE | Freq: Every day | ORAL | Status: DC

## 2022-06-08 MED ORDER — HYDRALAZINE HCL 20 MG/ML IJ SOLN: 5.0000 mg | INTRAMUSCULAR | Status: DC | PRN

## 2022-06-08 MED ORDER — SODIUM CHLORIDE 0.9 % IV SOLN: 250.0000 mL | INTRAVENOUS | Status: DC | PRN

## 2022-06-08 MED ORDER — FAMOTIDINE 20 MG PO TABS: 40.0000 mg | ORAL_TABLET | Freq: Once | ORAL | Status: DC | PRN

## 2022-06-08 MED ORDER — MIDAZOLAM HCL 5 MG/5ML IJ SOLN
INTRAMUSCULAR | Status: AC
  Filled 2022-06-08: qty 5

## 2022-06-08 MED ORDER — LABETALOL HCL 5 MG/ML IV SOLN: 10.0000 mg | INTRAVENOUS | Status: DC | PRN

## 2022-06-08 MED ORDER — CHLORHEXIDINE GLUCONATE CLOTH 2 % EX PADS
6.0000 | MEDICATED_PAD | Freq: Once | CUTANEOUS | Status: DC
  Administered 2022-06-08: 6 via TOPICAL

## 2022-06-08 MED ORDER — SODIUM CHLORIDE 0.9% FLUSH: 3.0000 mL | Freq: Two times a day (BID) | INTRAVENOUS | Status: DC

## 2022-06-08 MED ORDER — ONDANSETRON HCL 4 MG/2ML IJ SOLN: 4.0000 mg | Freq: Four times a day (QID) | INTRAMUSCULAR | Status: DC | PRN

## 2022-06-08 MED ORDER — FENTANYL CITRATE (PF) 100 MCG/2ML IJ SOLN
INTRAMUSCULAR | Status: DC | PRN
  Administered 2022-06-08: 25 ug via INTRAVENOUS
  Administered 2022-06-08: 50 ug via INTRAVENOUS

## 2022-06-08 MED ORDER — LABETALOL HCL 5 MG/ML IV SOLN
INTRAVENOUS | Status: AC
  Filled 2022-06-08: qty 4

## 2022-06-08 MED ORDER — VITAMIN C 500 MG PO TABS
250.0000 mg | ORAL_TABLET | Freq: Two times a day (BID) | ORAL | Status: DC
  Administered 2022-06-08: 250 mg via ORAL
  Filled 2022-06-08: qty 1

## 2022-06-08 MED ORDER — FENTANYL CITRATE PF 50 MCG/ML IJ SOSY: 12.5000 ug | PREFILLED_SYRINGE | Freq: Once | INTRAMUSCULAR | Status: DC | PRN

## 2022-06-08 MED ORDER — MIDAZOLAM HCL 2 MG/2ML IJ SOLN
INTRAMUSCULAR | Status: DC | PRN
  Administered 2022-06-08: 2 mg via INTRAVENOUS
  Administered 2022-06-08: .5 mg via INTRAVENOUS

## 2022-06-08 MED ORDER — LABETALOL HCL 5 MG/ML IV SOLN
INTRAVENOUS | Status: DC | PRN
  Administered 2022-06-08: 10 mg via INTRAVENOUS

## 2022-06-08 MED ORDER — HYDROMORPHONE HCL 1 MG/ML IJ SOLN: 1.0000 mg | Freq: Once | INTRAMUSCULAR | Status: DC | PRN

## 2022-06-08 MED ORDER — METHYLPREDNISOLONE SODIUM SUCC 125 MG IJ SOLR: 125.0000 mg | Freq: Once | INTRAMUSCULAR | Status: DC | PRN

## 2022-06-08 MED ORDER — DIPHENHYDRAMINE HCL 50 MG/ML IJ SOLN: 50.0000 mg | Freq: Once | INTRAMUSCULAR | Status: DC | PRN

## 2022-06-08 MED ORDER — SODIUM CHLORIDE 0.9 % IV SOLN: INTRAVENOUS | Status: DC

## 2022-06-08 MED ORDER — MORPHINE SULFATE (PF) 4 MG/ML IV SOLN: 2.0000 mg | INTRAVENOUS | Status: DC | PRN

## 2022-06-08 SURGICAL SUPPLY — 17 items
CANNULA 5F STIFF (CANNULA) IMPLANT
CATH ANGIO 5F PIGTAIL 65CM (CATHETERS) IMPLANT
CLOSURE PERCLOSE PROSTYLE (VASCULAR PRODUCTS) IMPLANT
COVER PROBE ULTRASOUND 5X96 (MISCELLANEOUS) IMPLANT
DEVICE STARCLOSE SE CLOSURE (Vascular Products) IMPLANT
GLIDEWIRE ADV .035X260CM (WIRE) IMPLANT
GOWN STRL REUS W/ TWL LRG LVL3 (GOWN DISPOSABLE) ×1 IMPLANT
GOWN STRL REUS W/TWL LRG LVL3 (GOWN DISPOSABLE) ×1
GUIDEWIRE SUPER STIFF .035X180 (WIRE) IMPLANT
NDL ENTRY 21GA 7CM ECHOTIP (NEEDLE) IMPLANT
NEEDLE ENTRY 21GA 7CM ECHOTIP (NEEDLE) ×1 IMPLANT
PACK ANGIOGRAPHY (CUSTOM PROCEDURE TRAY) ×1 IMPLANT
SET INTRO CAPELLA COAXIAL (SET/KITS/TRAYS/PACK) IMPLANT
SHEATH BRITE TIP 5FRX11 (SHEATH) IMPLANT
SYR MEDRAD MARK 7 150ML (SYRINGE) IMPLANT
TUBING CONTRAST HIGH PRESS 72 (TUBING) IMPLANT
WIRE GUIDERIGHT .035X150 (WIRE) IMPLANT

## 2022-06-08 NOTE — Interval H&P Note (Signed)
History and Physical Interval Note:  06/08/2022 3:41 PM  Raymond [name]  has presented today for surgery, with the diagnosis of PAD.  The various methods of treatment have been discussed with the patient and family. After consideration of risks, benefits and other options for treatment, the patient has consented to  Procedure(s): Lower Extremity Angiography (Right) as a surgical intervention.  The patient's history has been reviewed, patient examined, no change in status, stable for surgery.  I have reviewed the patient's chart and labs.  Questions were answered to the patient's satisfaction.     [name]

## 2022-06-08 NOTE — Progress Notes (Signed)
Progress Note   Patient: Raymond [name] AVW:[id number] DOB: [birth year] DOA: 06/07/2022     1 DOS: the patient was seen and examined on 06/08/2022     Subjective:  Patient seen and examined at bedside this morning Awaiting vascular studies Admits to decreased sensation in bilateral fourth Podiatry on board Denies nausea vomiting chest pain cough  Brief hospital course:  From HPI: "Raymond [name] is a 57 y.o. male with medical history significant of hypertension, type 2 diabetes, HFpEF, COPD with chronic hypoxic respiratory failure on 2 L, OSA on CPAP, hyperlipidemia, cardiomegaly, who presents to the ED due to foot pain.   Raymond [name] states that he has been dealing with bilateral lower extremity wounds for an extended period of time now, however his right second digit developed a new ulcer on the top approximately 1 week ago.  He has not noticed any drainage other than intermittent bleeding.  He denies any redness or warmth.  He denies any fever, chills, nausea, vomiting, diarrhea, abdominal pain, chest pain, palpitations or shortness of breath.  His podiatrist recommended he come in today for further evaluation.   ED course: On arrival to the ED, patient was hypertensive at 159/69 with heart rate of 80.  He was saturating at 91%.  He was afebrile at 98.  Initial workup notable for WBC of 10.5, hemoglobin of 12.0, glucose of 179, creatinine 1.16 with GFR above 60.  Lactic acid within normal limits at 1.9.  X-ray of the right foot was obtained with no evidence of osteomyelitis.  Podiatry consulted and requesting IV antibiotics be initiated.  [facility] contacted for admission."  Assessment and Plan: * Diabetic foot wound with infection and underlying osteomyelitis of the right second toe (HCC) History of chronic bilateral diabetic foot ulcers with acute ulcer development on the distal tip of the right second digit and dorsal aspect.   - Podiatry consulted; appreciate  their recommendations , Surgery also consulted we appreciate input - Per podiatry's notes, amputation is being considered - MRI with and without contrast pending - ABIs ordered as noted below - S/p vancomycin, cefepime and metronidazole in the ED.  Given clinical stability, will hold off on further antibiotics until bone biopsies are obtained - N.p.o. for now We will resume antibiotics for treatment of underlying osteomyelitis after bone biopsy has been obtained in order to tailor antibiotic to organism that may be cultured.   PVD (peripheral vascular disease) (HCC) History of PAD with recurrent wounds.   Vascular Surgeon on board - ABIs ordered - Continue statin and aspirin   Diabetes mellitus type 2, insulin dependent (HCC) - A1c pending - SSI, resistant - Semglee 45 units at bedtime - Hold home insulin, Victoza, metformin, and Jardiance   HTN (hypertension) - Per pharmacy records, patient has not picked up carvedilol or losartan in the last several months.  Will restart if necessary   COPD, moderate (HCC) Patient no longer requires oxygen.  He denies any shortness of breath or wheezing.  Examination is reassuring.   - DuoNebs as needed   Chronic diastolic heart failure (HCC) Patient appears relatively usual hemic at this time with trivial bilateral lower extremity edema.  No indication for IV diuresis.   -Continue home torsemide   Morbid obesity (HCC) History of morbid obesity complicated by type 2 diabetes, PAD.   OSA treated with Trilogy machine - BiPAP at bedtime   Advance Care Planning:   Code Status: Full Code verified by patient   Consults: Podiatry  Family Communication: No family at bed side   Severity of Illness:   Data Reviewed: I have reviewed patient's laboratory results from this morning     Physical Exam: Constitutional:      General: He is not in acute distress.    Appearance: He is obese. He is not toxic-appearing.  HENT:     Head:  Normocephalic and atraumatic.     Mouth/Throat:     Mouth: Mucous membranes are moist.     Pharynx: Oropharynx is clear.  Cardiovascular:     Rate and Rhythm: Normal rate and regular rhythm.     Heart sounds: No murmur heard.    No gallop.  Pulmonary:     Effort: Pulmonary effort is normal. No respiratory distress.     Breath sounds: Normal breath sounds. No wheezing, rhonchi or rales.  Musculoskeletal:     Comments: Trivial bilateral pitting edema  Skin:    General: Skin is warm and dry.     Comments: On the right second digit, there is approximately 1 cm ulceration on the dorsal aspect with surrounding skin changes, and some swelling noted.  no purulent drainage noted or increased warmth to touch.  His toe nail on this digit has been removed with surrounding ulceration that has not healed.  Neurological:     Mental Status: He is alert and oriented to person, place, and time. Mental status is at baseline.  Psychiatric:        Mood and Affect: Mood normal.        Behavior: Behavior normal.   Vitals:   06/08/22 0500 06/08/22 0747 06/08/22 1432 06/08/22 1448  BP: (!) 146/71 (!) 141/66  131/61  Pulse: 75 78  98  Resp: 18 20  20   Temp: 98.1 F (36.7 C) 98 F (36.7 C)  98 F (36.7 C)  TempSrc: Oral  Oral Oral  SpO2: 98% 93%  90%  Weight:   (!) 183.5 kg (!) 183.5 kg  Height:   6\' 2"  (1.88 m) 6\' 2"  (1.88 m)    Time spent: 45 minutes   Author: Loyce Dys, MD 06/08/2022 3:44 PM  For on call review [email or web address].

## 2022-06-08 NOTE — H&P (View-Only) (Signed)
Hospital Consult    Reason for Consult:  Osteomyelitis of Left Toes Requesting Physician:  Dr Imagene Gurney MD MRN #:  [id number]  History of Present Illness: This is a 57 y.o. male with medical history significant of hypertension, type 2 diabetes, HFpEF, COPD with chronic hypoxic respiratory failure on 2 L, OSA on CPAP, hyperlipidemia, cardiomegaly, who presents to the ED due to foot pain.   Patient states that he has been dealing with bilateral lower extremity wounds for an extended period of time now, however his right second digit developed a new ulcer on the top approximately 1 week ago. He has not noticed any drainage other than intermittent bleeding. He denies any redness or warmth. He denies any fever, chills, nausea, vomiting, diarrhea, abdominal pain, chest pain, palpitations or shortness of breath. His podiatrist request he go to the ER for evaluation.   Past Medical History:  Diagnosis Date   Arthritis    hands   Cellulitis    CHF (congestive heart failure) (HCC)    Chronic diastolic heart failure (HCC)    Chronic respiratory failure with hypoxia (HCC)    2 L [place] continuously   Diabetes mellitus type 2, insulin dependent (HCC)    Edema    FEET/LEGS   GERD (gastroesophageal reflux disease)    Hepatic steatosis    HOH (hard of hearing)    Hypertension    Morbid obesity (HCC)    Morbid obesity with BMI of 45.0-49.9, adult (HCC)    Neuropathy    Orthopnea    OSA on CPAP    TRILOGY VENTILATOR   Oxygen deficiency    2L  HS   PVD (peripheral vascular disease) (HCC)    Shortness of breath dyspnea    Systolic heart failure (HCC)    Preserved EF 50-55%    Past Surgical History:  Procedure Laterality Date   AMPUTATION Right 10/02/2014   Procedure: AMPUTATION RAY;  Surgeon: Recardo Evangelist, DPM;  Location: [facility] ORS;  Service: Podiatry;  Laterality: Right;   CATARACT EXTRACTION W/PHACO Right 09/10/2015   Procedure: CATARACT EXTRACTION PHACO AND INTRAOCULAR LENS PLACEMENT  (IOC);  Surgeon: Galen Manila, MD;  Location: [facility] ORS;  Service: Ophthalmology;  Laterality: Right;  [place] 01:34 AP% 19.3 CDE 18.36 Fluid pack lot # [id number]   CATARACT EXTRACTION W/PHACO Left 10/06/2015   Procedure: CATARACT EXTRACTION PHACO AND INTRAOCULAR LENS PLACEMENT (IOC);  Surgeon: Galen Manila, MD;  Location: [facility] ORS;  Service: Ophthalmology;  Laterality: Left;  [place] 00:56 AP% 19.5 CDE 11.02 Fluid Pack # [id number]   CHOLECYSTECTOMY     COLONOSCOPY WITH PROPOFOL N/A 05/17/2016   Procedure: COLONOSCOPY WITH PROPOFOL;  Surgeon: Christena Deem, MD;  Location[facility] ENDOSCOPY;  Service: Endoscopy;  Laterality: N/A;   COLONOSCOPY WITH PROPOFOL N/A 12/27/2021   Procedure: COLONOSCOPY WITH PROPOFOL;  Surgeon: Wyline Mood, MD;  Location: [facility] ENDOSCOPY;  Service: Gastroenterology;  Laterality: N/A;   EYE SURGERY Left    bilat laser   HIP SURGERY Right    8 th grade pins   NOSE SURGERY     PERIPHERAL VASCULAR CATHETERIZATION Right 10/02/2014   Procedure: Lower Extremity Angiography;  Surgeon: Annice Needy, MD;  Location: [facility] INVASIVE CV LAB;  Service: Cardiovascular;  Laterality: Right;   TOE AMPUTATION Left 2010   2cd   TOE AMPUTATION Right 2009    No Known Allergies  Prior to Admission medications   Medication Sig Start Date End Date Taking? Authorizing Provider  aspirin EC 81 MG tablet Take  81 mg by mouth daily.   Yes [provider]  atorvastatin (LIPITOR) 40 MG tablet atorvastatin 40 mg tablet   Yes [provider]  carvedilol (COREG) 6.25 MG tablet Take 1 tablet (6.25 mg total) by mouth 2 (two) times daily. 04/30/17 06/07/22 Yes Rai, Ripudeep K, MD  empagliflozin (JARDIANCE) 25 MG TABS tablet Take by mouth daily. Once daily in the am   Yes [provider]  gabapentin (NEURONTIN) 300 MG capsule Take 300 mg by mouth 3 (three) times daily.   Yes [provider]  insulin lispro (HUMALOG) 100 UNIT/ML cartridge Inject 60 Units into the skin 3  (three) times daily with meals.   Yes [provider]  LANTUS 100 UNIT/ML injection Inject 90 Units into the skin 2 (two) times daily. 04/28/22  Yes [provider]  liraglutide (VICTOZA) 18 MG/3ML SOPN Inject 1.2 mg into the skin in the morning and at bedtime.   Yes [provider]  losartan (COZAAR) 50 MG tablet Take 50 mg by mouth daily.   Yes [provider]  metFORMIN (GLUCOPHAGE) 1000 MG tablet Take 1,000 mg by mouth 2 (two) times daily with a meal.   Yes [provider]  Multiple Vitamins-Minerals (CENTRUM SILVER 50+MEN) TABS Take 1 tablet by mouth daily.   Yes [provider]  omega-3 acid ethyl esters (LOVAZA) 1 g capsule Take 1 g by mouth daily.   Yes [provider]  pantoprazole (PROTONIX) 40 MG tablet Take 40 mg by mouth daily.   Yes [provider]  torsemide (DEMADEX) 20 MG tablet Take 2 tablets (40 mg total) by mouth daily. 05/01/17  Yes Rai, Ripudeep K, MD  valsartan (DIOVAN) 160 MG tablet Take 160 mg by mouth daily.   Yes [provider]  ACCU-CHEK SMARTVIEW test strip  02/06/18   [provider]  HYDROcodone-acetaminophen (NORCO/VICODIN) 5-325 MG tablet Take 1 tablet by mouth every 6 (six) hours as needed for severe pain. Patient not taking: Reported on 06/07/2022 01/31/22 01/31/23  Shaune Pollack, MD  insulin detemir (LEVEMIR) 100 UNIT/ML injection Inject 89 Units into the skin 2 (two) times daily.  Patient not taking: Reported on 06/07/2022    [provider]  OXYGEN Inhale 2 L into the lungs daily.    [provider]  TRUEPLUS INSULIN SYRINGE 29G X 1/2" 1 ML MISC  02/16/18   [provider]  TRUEPLUS PEN NEEDLES 31G X 8 MM MISC  02/06/18   [provider]    Social History   Socioeconomic History   Marital status: Divorced    Spouse name: Not on file   Number of children: Not on file   Years of education: Not on file   Highest education level: Not on file   Occupational History    Employer: UNEMPLOYED  Tobacco Use   Smoking status: Never   Smokeless tobacco: Never  Vaping Use   Vaping Use: Never used  Substance and Sexual Activity   Alcohol use: No    Alcohol/week: 0.0 standard drinks of alcohol   Drug use: No   Sexual activity: Not on file  Other Topics Concern   Not on file  Social History Narrative   Not on file   Social Determinants of Health   Financial Resource Strain: Not on file  Food Insecurity: Not on file  Transportation Needs: Not on file  Physical Activity: Not on file  Stress: Not on file  Social Connections: Not on file  Intimate Partner  Violence: Not on file     Family History  Problem Relation Age of Onset   Hypertension Mother    Diabetes Mother    Stroke Father    Hypertension Father    Heart attack Father     ROS: Otherwise negative unless mentioned in HPI  Physical Examination  Vitals:   06/07/22 2007 06/08/22 0500  BP: (!) 145/58 (!) 146/71  Pulse: 78 75  Resp: 20 18  Temp: 97.8 F (36.6 C) 98.1 F (36.7 C)  SpO2: 95% 98%   There is no height or weight on file to calculate BMI.  General:  WDWN in NAD Gait: Not observed HENT: WNL, normocephalic Pulmonary: normal non-labored breathing, without Rales, rhonchi,  wheezing Cardiac: regular, without  Murmurs, rubs or gallops; without carotid bruits Abdomen: Positive Bowel Sounds, soft, NT/ND, no masses Skin: without rashes Vascular Exam/Pulses: Unable to palpate bilateral lower extremity pulses due to chronic +3 edema with lymphedema Extremities: with ischemic changes, with Gangrene , with cellulitis; with open wounds;  Musculoskeletal: no muscle wasting or atrophy  Neurologic: A&O X 3;  No focal weakness or paresthesias are detected; speech is fluent/normal Psychiatric:  The pt has Normal affect. Lymph:  Unremarkable  CBC    Component Value Date/Time   WBC 7.7 06/08/2022 0545   RBC 4.56 06/08/2022 0545   HGB 11.5 (L) 06/08/2022  0545   HGB 12.6 (L) 05/02/2011 1353   HCT 37.3 (L) 06/08/2022 0545   HCT 38.0 (L) 05/02/2011 1353   PLT 293 06/08/2022 0545   PLT 315 05/02/2011 1353   MCV 81.8 06/08/2022 0545   MCV 85 05/02/2011 1353   MCH 25.2 (L) 06/08/2022 0545   MCHC 30.8 06/08/2022 0545   RDW 14.8 06/08/2022 0545   RDW 15.1 (H) 05/02/2011 1353   LYMPHSABS 2.6 01/31/2022 1509   LYMPHSABS 2.1 05/02/2011 1353   MONOABS 0.6 01/31/2022 1509   MONOABS 0.6 05/02/2011 1353   EOSABS 1.1 (H) 01/31/2022 1509   EOSABS 0.3 05/02/2011 1353   BASOSABS 0.1 01/31/2022 1509   BASOSABS 0.0 05/02/2011 1353    BMET    Component Value Date/Time   NA 145 06/08/2022 0545   NA 142 05/02/2011 1353   K 3.9 06/08/2022 0545   K 4.2 05/02/2011 1353   CL 109 06/08/2022 0545   CL 104 05/02/2011 1353   CO2 30 06/08/2022 0545   CO2 28 05/02/2011 1353   GLUCOSE 112 (H) 06/08/2022 0545   GLUCOSE 179 (H) 05/02/2011 1353   BUN 17 06/08/2022 0545   BUN 19 (H) 05/02/2011 1353   CREATININE 0.97 06/08/2022 0545   CREATININE 1.14 10/10/2011 1743   CALCIUM 8.5 (L) 06/08/2022 0545   CALCIUM 8.8 05/02/2011 1353   GFRNONAA >60 06/08/2022 0545   GFRNONAA >60 10/10/2011 1743   GFRAA >60 04/30/2017 0634   GFRAA >60 10/10/2011 1743    COAGS: Lab Results  Component Value Date   INR 1.07 04/28/2017     Non-Invasive Vascular Imaging:   EXAM: NONINVASIVE PHYSIOLOGIC VASCULAR STUDY OF BILATERAL LOWER EXTREMITIES   TECHNIQUE: Evaluation of both lower extremities were performed at rest, including calculation of ankle-brachial indices with single level Doppler, pressure and pulse volume recording.   COMPARISON:  None Available.   FINDINGS: Right ABI:  Incompressible   Left ABI:  Incompressible   Right Lower Extremity:  Normal arterial waveforms at the ankle.   Left Lower Extremity:  Normal arterial waveforms at the ankle.   Toe brachial indices are not obtained.  Right brachial pressure: 167 mm Hg   Left brachial  pressure: 159 mm Hg   IMPRESSION: 1. Incompressible bilateral lower extremity ABIs secondary to arteriosclerosis, limiting evaluation. Normal arterial waveforms at the ankles. CT or formal arteriography would be more helpful in assessing for the presence of a hemodynamically significant lesion.  EXAM: MRI OF THE RIGHT TOES WITHOUT AND WITH CONTRAST   TECHNIQUE: Multiplanar, multisequence MR imaging of the right forefoot was performed both before and after administration of intravenous contrast.   CONTRAST:  10mL GADAVIST GADOBUTROL 1 MMOL/ML IV SOLN   COMPARISON:  Right foot x-rays from same day. MRI right foot dated October 01, 2014.   FINDINGS: Bones/Joint/Cartilage   Abnormal marrow edema with corresponding decreased T1 marrow signal involving the second middle and distal phalanges and head of the second proximal phalanx (series 11, images 19-20). Remaining marrow signal is normal. Prior first ray amputation. No fracture or dislocation. Joint spaces are preserved. No joint effusion.   Ligaments   Second through fifth toe collateral ligaments are intact. Lisfranc ligament is intact.   Muscles and Tendons Postsurgical changes of the first flexor and extensor tendons. Remaining tendons are intact. No tenosynovitis. Diffuse atrophy of the intrinsic foot muscles.   Soft tissue Soft tissue swelling and enhancement of the second toe. Small ulceration along the dorsal aspect of the second proximal phalanx head (series 12, image 9). Severe dorsal foot soft tissue swelling without enhancement. No soft tissue mass.   IMPRESSION: 1. Small ulceration along the dorsal aspect of the second proximal phalanx head with underlying osteomyelitis of the second proximal phalanx head, second middle, and second distal phalanges. 2. Second toe cellulitis without abscess.  EXAM: RIGHT FOOT - 2 VIEW   COMPARISON:  X-ray 09/30/2014   FINDINGS: Evidence of prior amputation involving  the phalanges and metatarsal head of the first ray. Sclerotic margin. No acute fracture or dislocation. Preserved bone mineralization. Scattered vascular calcifications. There is soft tissue swelling of the second digit. Phalanges are flexed on these views but no obvious erosive changes. If there is further concern of bone infection, bone scan or MRI may be of some benefit for further sensitivity. Global soft tissue swelling of the midfoot as well.   IMPRESSION: Previous first ray amputation.   Soft tissue swelling of the foot and second digit. No definite erosive changes at this time by x-ray  Statin:  Yes.   Beta Blocker:  Yes.   Aspirin:  Yes.   ACEI:  No. ARB:  Yes.   CCB use:  No Other antiplatelets/anticoagulants:  No.    ASSESSMENT/PLAN: This is a 57 y.o. male with medical history significant of hypertension, type 2 diabetes, HFpEF, COPD with chronic hypoxic respiratory failure on 2 L, OSA on CPAP, hyperlipidemia, cardiomegaly, who presents to the ED due to foot pain.   PLAN: Vascular surgery plans on taking the patient to the vascular lab this afternoon for a right lower extremity angiogram with possible intervention.  I discussed in detail with the patient the procedure, the benefits, risks, and the complications.  He verbalizes understanding.  He wishes to proceed with the procedure as soon as possible.  Patient was made n.p.o. earlier this morning.  He denies eating or drinking anything since midnight last night.  Vitals all remained stable this morning.   -I discussed the plan in detail with Dr. [name] MD and he is in agreement with the plan.   Marcie Bal [facility] 06/08/2022 7:33 AM

## 2022-06-08 NOTE — TOC Initial Note (Signed)
Transition of Care [facility]) - Initial/Assessment Note    Patient Details  Name: Raymond [name] MRN: [id number] Date of Birth: 03/21/1965  Transition of Care [facility]) CM/SW Contact:    Chapman Fitch, RN Phone Number: 06/08/2022, 10:32 AM  Clinical Narrative:                  Admitted for: plan for angiogram today and likely amputation of toe Admitted from: home with roommate PCP: Martie Round Pharmacy: denies issues with transportation Current home health/prior home health/DME: patient no longer has Home o2. Patient confirms he still has his trilogy  Requested therapy eval after procedure  Patient agreeable to home health if needed at discharge and states he does not have a preference of agency  Patient states that his daughter provides transport to appointments, and his sister will likely transport at discharge     Expected Discharge Plan: Home w Home Health Services Barriers to Discharge: Continued Medical Work up   Patient Goals and CMS Choice Patient states their goals for this hospitalization and ongoing recovery are:: to get blood flow to his toe   Choice offered to / list presented to : Patient [place] ownership interest in [facility].provided to:: Patient    Expected Discharge Plan and Services     Post Acute Care Choice: Home Health                                        Prior Living Arrangements/Services   Lives with:: Roommate Patient language and need for interpreter reviewed:: Yes Do you feel safe going back to the place where you live?: Yes          Current home services: DME Criminal Activity/Legal Involvement Pertinent to Current Situation/Hospitalization: No - Comment as needed  Activities of Daily Living      Permission Sought/Granted                  Emotional Assessment       Orientation: : Oriented to Self, Oriented to Place, Oriented to  Time, Oriented to Situation      Admission diagnosis:   Diabetic foot infection (HCC) [N82.956, L08.9] Open wound of toe, initial encounter [S91.109A] Osteomyelitis of right foot, unspecified type (HCC) [M86.9] Patient Active Problem List   Diagnosis Date Noted   Diabetic foot infection (HCC) 06/07/2022   Colon cancer screening 12/27/2021   Adenomatous polyp of colon 12/27/2021   Pneumonia due to COVID-19 virus 02/22/2020   Uncontrolled type 2 diabetes mellitus with hyperglycemia (HCC) 02/08/2020   Morbid obesity with BMI of 50.0-59.9, adult (HCC) 02/08/2020   Acute hypoxemic respiratory failure due to COVID-19 (HCC) 02/05/2020   Acute on chronic respiratory failure with hypoxia (HCC) 04/28/2017   Acute kidney injury (HCC) 04/28/2017   Hypotension 04/28/2017   PVD (peripheral vascular disease) (HCC) 04/28/2017   Hypertension 04/28/2017   Diabetes mellitus type 2, insulin dependent (HCC) 04/28/2017   OSA on CPAP 04/28/2017   Chronic diastolic heart failure, NYHA class 1 (HCC) 04/28/2017   Chest pain with moderate risk for cardiac etiology 04/28/2017   Morbid obesity with BMI of 45.0-49.9, adult (HCC) 04/28/2017   Lymphedema 02/11/2017   HTN (hypertension) 02/01/2017   Diabetes (HCC) 02/01/2017   Pneumonia 01/13/2017   Cellulitis of right foot 09/30/2014   COPD, moderate (HCC) 07/07/2014   Chronic diastolic heart failure (HCC) 05/06/2014  OSA treated with Trilogy machine 04/28/2014   Hypoxemia requiring supplemental oxygen 04/28/2014   Morbid obesity (HCC) 04/28/2014   Hyperlipidemia 07/09/2013   PCP:  Martie Round, NP Pharmacy:   [facility] [place] [place] - [address] [place] [address] Phone: [phone] Fax: [phone]     Social Determinants of Health (SDOH) Social History: SDOH Screenings   Tobacco Use: Low Risk  (01/31/2022)   SDOH Interventions:     Readmission Risk Interventions     No data to display

## 2022-06-08 NOTE — Progress Notes (Signed)
Patient received from Cath lab awake and in stable condition. Left groin site CDI. Family at bedside.  Raymond [name]

## 2022-06-08 NOTE — Consult Note (Signed)
Hospital Consult    Reason for Consult:  Osteomyelitis of Left Toes Requesting Physician:  Dr Lulia Basaraba MD MRN #:  [id number]  History of Present Illness: This is a 56 y.o. male with medical history significant of hypertension, type 2 diabetes, HFpEF, COPD with chronic hypoxic respiratory failure on 2 L, OSA on CPAP, hyperlipidemia, cardiomegaly, who presents to the ED due to foot pain.   Patient states that he has been dealing with bilateral lower extremity wounds for an extended period of time now, however his right second digit developed a new ulcer on the top approximately 1 week ago. He has not noticed any drainage other than intermittent bleeding. He denies any redness or warmth. He denies any fever, chills, nausea, vomiting, diarrhea, abdominal pain, chest pain, palpitations or shortness of breath. His podiatrist request he go to the ER for evaluation.   Past Medical History:  Diagnosis Date   Arthritis    hands   Cellulitis    CHF (congestive heart failure) (HCC)    Chronic diastolic heart failure (HCC)    Chronic respiratory failure with hypoxia (HCC)    2 L [place] continuously   Diabetes mellitus type 2, insulin dependent (HCC)    Edema    FEET/LEGS   GERD (gastroesophageal reflux disease)    Hepatic steatosis    HOH (hard of hearing)    Hypertension    Morbid obesity (HCC)    Morbid obesity with BMI of 45.0-49.9, adult (HCC)    Neuropathy    Orthopnea    OSA on CPAP    TRILOGY VENTILATOR   Oxygen deficiency    2L  HS   PVD (peripheral vascular disease) (HCC)    Shortness of breath dyspnea    Systolic heart failure (HCC)    Preserved EF 50-55%    Past Surgical History:  Procedure Laterality Date   AMPUTATION Right 10/02/2014   Procedure: AMPUTATION RAY;  Surgeon: Matthew Troxler, DPM;  Location: [facility] ORS;  Service: Podiatry;  Laterality: Right;   CATARACT EXTRACTION W/PHACO Right 09/10/2015   Procedure: CATARACT EXTRACTION PHACO AND INTRAOCULAR LENS PLACEMENT  (IOC);  Surgeon: Donyell Porfilio, MD;  Location: [facility] ORS;  Service: Ophthalmology;  Laterality: Right;  US 01:34 AP% 19.3 CDE 18.36 Fluid pack lot # [id number]   CATARACT EXTRACTION W/PHACO Left 10/06/2015   Procedure: CATARACT EXTRACTION PHACO AND INTRAOCULAR LENS PLACEMENT (IOC);  Surgeon: Caster Porfilio, MD;  Location: [facility] ORS;  Service: Ophthalmology;  Laterality: Left;  US 00:56 AP% 19.5 CDE 11.02 Fluid Pack # [id number]   CHOLECYSTECTOMY     COLONOSCOPY WITH PROPOFOL N/A 05/17/2016   Procedure: COLONOSCOPY WITH PROPOFOL;  Surgeon: Martin U Skulskie, MD;  Location: [facility] ENDOS[facility];  Service: Endoscopy;  Laterality: N/A;   COLONOSCOPY WITH PROPOFOL N/A 12/27/2021   Procedure: COLONOSCOPY WITH PROPOFOL;  Surgeon: Anna, Kiran, MD;  Location: [facility] ENDOSCOPY;  Service: Gastroenterology;  Laterality: N/A;   EYE SURGERY Left    bilat laser   HIP SURGERY Right    8 th grade pins   NOSE SURGERY     PERIPHERAL VASCULAR CATHETERIZATION Right 10/02/2014   Procedure: Lower Extremity Angiography;  Surgeon: Jason S Dew, MD;  Location: [facility] INVASIVE CV LAB;  Service: Cardiovascular;  Laterality: Right;   TOE AMPUTATION Left 2010   2cd   TOE AMPUTATION Right 2009    No Known Allergies  Prior to Admission medications   Medication Sig Start Date End Date Taking? Authorizing Provider  aspirin EC 81 MG tablet Take   81 mg by mouth daily.   Yes [provider]  atorvastatin (LIPITOR) 40 MG tablet atorvastatin 40 mg tablet   Yes [provider]  carvedilol (COREG) 6.25 MG tablet Take 1 tablet (6.25 mg total) by mouth 2 (two) times daily. 04/30/17 06/07/22 Yes Rai, Ripudeep K, MD  empagliflozin (JARDIANCE) 25 MG TABS tablet Take by mouth daily. Once daily in the am   Yes [provider]  gabapentin (NEURONTIN) 300 MG capsule Take 300 mg by mouth 3 (three) times daily.   Yes [provider]  insulin lispro (HUMALOG) 100 UNIT/ML cartridge Inject 60 Units into the skin 3  (three) times daily with meals.   Yes [provider]  LANTUS 100 UNIT/ML injection Inject 90 Units into the skin 2 (two) times daily. 04/28/22  Yes [provider]  liraglutide (VICTOZA) 18 MG/3ML SOPN Inject 1.2 mg into the skin in the morning and at bedtime.   Yes [provider]  losartan (COZAAR) 50 MG tablet Take 50 mg by mouth daily.   Yes [provider]  metFORMIN (GLUCOPHAGE) 1000 MG tablet Take 1,000 mg by mouth 2 (two) times daily with a meal.   Yes [provider]  Multiple Vitamins-Minerals (CENTRUM SILVER 50+MEN) TABS Take 1 tablet by mouth daily.   Yes [provider]  omega-3 acid ethyl esters (LOVAZA) 1 g capsule Take 1 g by mouth daily.   Yes [provider]  pantoprazole (PROTONIX) 40 MG tablet Take 40 mg by mouth daily.   Yes [provider]  torsemide (DEMADEX) 20 MG tablet Take 2 tablets (40 mg total) by mouth daily. 05/01/17  Yes Rai, Ripudeep K, MD  valsartan (DIOVAN) 160 MG tablet Take 160 mg by mouth daily.   Yes [provider]  ACCU-CHEK SMARTVIEW test strip  02/06/18   [provider]  HYDROcodone-acetaminophen (NORCO/VICODIN) 5-325 MG tablet Take 1 tablet by mouth every 6 (six) hours as needed for severe pain. Patient not taking: Reported on 06/07/2022 01/31/22 01/31/23  Isaacs, Cameron, MD  insulin detemir (LEVEMIR) 100 UNIT/ML injection Inject 89 Units into the skin 2 (two) times daily.  Patient not taking: Reported on 06/07/2022    [provider]  OXYGEN Inhale 2 L into the lungs daily.    [provider]  TRUEPLUS INSULIN SYRINGE 29G X 1/2" 1 ML MISC  02/16/18   [provider]  TRUEPLUS PEN NEEDLES 31G X 8 MM MISC  02/06/18   [provider]    Social History   Socioeconomic History   Marital status: Divorced    Spouse name: Not on file   Number of children: Not on file   Years of education: Not on file   Highest education level: Not on file   Occupational History    Employer: UNEMPLOYED  Tobacco Use   Smoking status: Never   Smokeless tobacco: Never  Vaping Use   Vaping Use: Never used  Substance and Sexual Activity   Alcohol use: No    Alcohol/week: 0.0 standard drinks of alcohol   Drug use: No   Sexual activity: Not on file  Other Topics Concern   Not on file  Social History Narrative   Not on file   Social Determinants of Health   Financial Resource Strain: Not on file  Food Insecurity: Not on file  Transportation Needs: Not on file  Physical Activity: Not on file  Stress: Not on file  Social Connections: Not on file  Intimate Partner   Violence: Not on file     Family History  Problem Relation Age of Onset   Hypertension Mother    Diabetes Mother    Stroke Father    Hypertension Father    Heart attack Father     ROS: Otherwise negative unless mentioned in HPI  Physical Examination  Vitals:   06/07/22 2007 06/08/22 0500  BP: (!) 145/58 (!) 146/71  Pulse: 78 75  Resp: 20 18  Temp: 97.8 F (36.6 C) 98.1 F (36.7 C)  SpO2: 95% 98%   There is no height or weight on file to calculate BMI.  General:  WDWN in NAD Gait: Not observed HENT: WNL, normocephalic Pulmonary: normal non-labored breathing, without Rales, rhonchi,  wheezing Cardiac: regular, without  Murmurs, rubs or gallops; without carotid bruits Abdomen: Positive Bowel Sounds, soft, NT/ND, no masses Skin: without rashes Vascular Exam/Pulses: Unable to palpate bilateral lower extremity pulses due to chronic +3 edema with lymphedema Extremities: with ischemic changes, with Gangrene , with cellulitis; with open wounds;  Musculoskeletal: no muscle wasting or atrophy  Neurologic: A&O X 3;  No focal weakness or paresthesias are detected; speech is fluent/normal Psychiatric:  The pt has Normal affect. Lymph:  Unremarkable  CBC    Component Value Date/Time   WBC 7.7 06/08/2022 0545   RBC 4.56 06/08/2022 0545   HGB 11.5 (L) 06/08/2022  0545   HGB 12.6 (L) 05/02/2011 1353   HCT 37.3 (L) 06/08/2022 0545   HCT 38.0 (L) 05/02/2011 1353   PLT 293 06/08/2022 0545   PLT 315 05/02/2011 1353   MCV 81.8 06/08/2022 0545   MCV 85 05/02/2011 1353   MCH 25.2 (L) 06/08/2022 0545   MCHC 30.8 06/08/2022 0545   RDW 14.8 06/08/2022 0545   RDW 15.1 (H) 05/02/2011 1353   LYMPHSABS 2.6 01/31/2022 1509   LYMPHSABS 2.1 05/02/2011 1353   MONOABS 0.6 01/31/2022 1509   MONOABS 0.6 05/02/2011 1353   EOSABS 1.1 (H) 01/31/2022 1509   EOSABS 0.3 05/02/2011 1353   BASOSABS 0.1 01/31/2022 1509   BASOSABS 0.0 05/02/2011 1353    BMET    Component Value Date/Time   NA 145 06/08/2022 0545   NA 142 05/02/2011 1353   K 3.9 06/08/2022 0545   K 4.2 05/02/2011 1353   CL 109 06/08/2022 0545   CL 104 05/02/2011 1353   CO2 30 06/08/2022 0545   CO2 28 05/02/2011 1353   GLUCOSE 112 (H) 06/08/2022 0545   GLUCOSE 179 (H) 05/02/2011 1353   BUN 17 06/08/2022 0545   BUN 19 (H) 05/02/2011 1353   CREATININE 0.97 06/08/2022 0545   CREATININE 1.14 10/10/2011 1743   CALCIUM 8.5 (L) 06/08/2022 0545   CALCIUM 8.8 05/02/2011 1353   GFRNONAA >60 06/08/2022 0545   GFRNONAA >60 10/10/2011 1743   GFRAA >60 04/30/2017 0634   GFRAA >60 10/10/2011 1743    COAGS: Lab Results  Component Value Date   INR 1.07 04/28/2017     Non-Invasive Vascular Imaging:   EXAM: NONINVASIVE PHYSIOLOGIC VASCULAR STUDY OF BILATERAL LOWER EXTREMITIES   TECHNIQUE: Evaluation of both lower extremities were performed at rest, including calculation of ankle-brachial indices with single level Doppler, pressure and pulse volume recording.   COMPARISON:  None Available.   FINDINGS: Right ABI:  Incompressible   Left ABI:  Incompressible   Right Lower Extremity:  Normal arterial waveforms at the ankle.   Left Lower Extremity:  Normal arterial waveforms at the ankle.   Toe brachial indices are not obtained.     Right brachial pressure: 167 mm Hg   Left brachial  pressure: 159 mm Hg   IMPRESSION: 1. Incompressible bilateral lower extremity ABIs secondary to arteriosclerosis, limiting evaluation. Normal arterial waveforms at the ankles. CT or formal arteriography would be more helpful in assessing for the presence of a hemodynamically significant lesion.  EXAM: MRI OF THE RIGHT TOES WITHOUT AND WITH CONTRAST   TECHNIQUE: Multiplanar, multisequence MR imaging of the right forefoot was performed both before and after administration of intravenous contrast.   CONTRAST:  10mL GADAVIST GADOBUTROL 1 MMOL/ML IV SOLN   COMPARISON:  Right foot x-rays from same day. MRI right foot dated October 01, 2014.   FINDINGS: Bones/Joint/Cartilage   Abnormal marrow edema with corresponding decreased T1 marrow signal involving the second middle and distal phalanges and head of the second proximal phalanx (series 11, images 19-20). Remaining marrow signal is normal. Prior first ray amputation. No fracture or dislocation. Joint spaces are preserved. No joint effusion.   Ligaments   Second through fifth toe collateral ligaments are intact. Lisfranc ligament is intact.   Muscles and Tendons Postsurgical changes of the first flexor and extensor tendons. Remaining tendons are intact. No tenosynovitis. Diffuse atrophy of the intrinsic foot muscles.   Soft tissue Soft tissue swelling and enhancement of the second toe. Small ulceration along the dorsal aspect of the second proximal phalanx head (series 12, image 9). Severe dorsal foot soft tissue swelling without enhancement. No soft tissue mass.   IMPRESSION: 1. Small ulceration along the dorsal aspect of the second proximal phalanx head with underlying osteomyelitis of the second proximal phalanx head, second middle, and second distal phalanges. 2. Second toe cellulitis without abscess.  EXAM: RIGHT FOOT - 2 VIEW   COMPARISON:  X-ray 09/30/2014   FINDINGS: Evidence of prior amputation involving  the phalanges and metatarsal head of the first ray. Sclerotic margin. No acute fracture or dislocation. Preserved bone mineralization. Scattered vascular calcifications. There is soft tissue swelling of the second digit. Phalanges are flexed on these views but no obvious erosive changes. If there is further concern of bone infection, bone scan or MRI may be of some benefit for further sensitivity. Global soft tissue swelling of the midfoot as well.   IMPRESSION: Previous first ray amputation.   Soft tissue swelling of the foot and second digit. No definite erosive changes at this time by x-ray  Statin:  Yes.   Beta Blocker:  Yes.   Aspirin:  Yes.   ACEI:  No. ARB:  Yes.   CCB use:  No Other antiplatelets/anticoagulants:  No.    ASSESSMENT/PLAN: This is a 56 y.o. male with medical history significant of hypertension, type 2 diabetes, HFpEF, COPD with chronic hypoxic respiratory failure on 2 L, OSA on CPAP, hyperlipidemia, cardiomegaly, who presents to the ED due to foot pain.   PLAN: Vascular surgery plans on taking the patient to the vascular lab this afternoon for a right lower extremity angiogram with possible intervention.  I discussed in detail with the patient the procedure, the benefits, risks, and the complications.  He verbalizes understanding.  He wishes to proceed with the procedure as soon as possible.  Patient was made n.p.o. earlier this morning.  He denies eating or drinking anything since midnight last night.  Vitals all remained stable this morning.   -I discussed the plan in detail with Dr. Gregory Schnier MD and he is in agreement with the plan.   Anniston Nellums R Kaya [facility] 06/08/2022 7:33 AM  

## 2022-06-08 NOTE — Inpatient Diabetes Management (Signed)
Inpatient Diabetes Program Recommendations  AACE/ADA: New Consensus Statement on Inpatient Glycemic Control  Target Ranges:  Prepandial:   less than 140 mg/dL      Peak postprandial:   less than 180 mg/dL (1-2 hours)      Critically ill patients:  140 - 180 mg/dL    Latest Reference Range & Units 06/07/22 18:07 06/07/22 21:08 06/08/22 07:46  Glucose-Capillary 70 - 99 mg/dL 161 (H) 096 (H) 045 (H)    Latest Reference Range & Units 06/07/22 13:34  Hemoglobin A1C 4.8 - 5.6 % 7.4 (H)   Review of Glycemic Control  Diabetes history: DM2 Outpatient Diabetes medications: Lantus 90 units BID, Humalog 60 units TID with meals, Victoza 1.2 mg daily, Metformin 1000 mg BID Current orders for Inpatient glycemic control: Semglee 45 units QHS, Novolog 0-20 units TID with meals  Inpatient Diabetes Program Recommendations:    Insulin: Patient received Semglee 45 units last night and CBG 104 mg/dl this morning.  Agree with current insulin orders.   HbgA1C:  A1C 7.4% on 06/07/22 indicating an average glucose of 166 mg/dl over the past 2-3 months.  Thanks, Orlando Penner, RN, MSN, CDCES Diabetes Coordinator Inpatient Diabetes Program [phone] (Team Pager from 8am to 5pm)

## 2022-06-08 NOTE — Treatment Plan (Signed)
Patient was unavailable to see today during rounding, was still in Cath Lab for procedure.  Case was discussed with Dr. Gilda Crease, he has appropriate flow and no intervention was required.  Hopefully good prognosis for healing of amputation site.  N.p.o. past midnight tomorrow for right second toe amputation with Dr. Allena Katz midday.  If he is unable to take during this timeframe I will plan to take him to her surgery Friday afternoon.  Case posting message left on OR line, consent order placed  Sharl Ma, DPM 06/08/2022

## 2022-06-08 NOTE — Op Note (Signed)
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  Abdominal aortogram  2.  Selective injection of the right lower extremity third order catheter placement  3.  Ultrasound-guided access to the left common femoral artery  4.  StarClose left femoral artery    Anesthesia: Conscious sedation was administered by the interventional radiology RN under my direct supervision. IV Versed plus fentanyl were utilized. Continuous ECG, pulse oximetry and blood pressure was monitored throughout the entire procedure.  Conscious sedation was administered for a total of 38 minutes.  Sheath: 5 French 11 cm Pinnacle sheath retrograde left common femoral  Contrast: 40 cc   Fluoroscopy Time: 2.3 minutes  Indications:  The patient presents to [facility] with atherosclerotic occlusive disease bilateral lower extremities with ulceration associated with osteomyelitis of the right forefoot.  Pedal pulses are nonpalpable bilaterally suggesting hemodynamically significant atherosclerotic occlusive disease.  The risks and benefits as well as alternative therapies for lower extremity revascularization are reviewed with the patient all questions are answered the patient agrees to proceed.  The patient is therefore undergoing angiography with the hope for intervention for limb salvage.   Procedure:  Raymond [name] a 57 y.o. male who was identified and appropriate procedural time out was performed.  The patient was then placed supine on the table and prepped and draped in the usual sterile fashion.  Ultrasound was used to evaluate the left common femoral artery.  It was echolucent and pulsatile  indicating it is patent .  An ultrasound image was acquired for the permanent record.  A micropuncture needle was used to access the left common femoral artery under direct ultrasound guidance.  The microwire was then advanced under fluoroscopic guidance without difficulty followed by the micro-sheath.  A 0.035 J wire was advanced without resistance and a 5Fr sheath was placed.    Pigtail catheter was then advanced to the level of T12 and AP projection of the aorta was obtained. Pigtail catheter was then repositioned to above the bifurcation and LAO view of the pelvis was obtained. Stiff angled Glidewire and pigtail catheter was then used across the bifurcation and the catheter was positioned in the distal external iliac artery.  RAO of the right groin was then obtained. Wire was reintroduced and negotiated into the SFA and the catheter was advanced into the SFA. Distal runoff was then performed.  After review of the images the catheter was removed over wire and an LAO view of the groin was obtained. StarClose device was deployed without difficulty.   Findings:   Aortogram: The abdominal aorta is opacified with a bolus injection contrast.  Single renal arteries are noted bilaterally with normal nephrograms.  No evidence of hemodynamically significant renal artery stenosis.  There are no hemodynamically significant stenoses identified within the aorta.  The aortic bifurcation is mildly diseased but widely patent.  Bilateral common internal and external iliac arteries are free of hemodynamically significant lesions.  Right lower Extremity: The right common femoral, profunda femoris superficial femoral and popliteal arteries demonstrate mild atherosclerotic changes but there are no hemodynamically significant lesions.  Trifurcation is patent.  There is 2 vessel runoff to the foot with the anterior tibial being the dominant and of good size filling the dorsalis pedis and the pedal arch.  Posterior tibial is  also patent through its entire length without hemodynamically significant stenosis just somewhat smaller than the anterior tibial.  Peroneal is patent down to the ankle but does not have significant collaterals to the forefoot  SUMMARY: Based on these images no intervention is performed at this time.    Disposition: Patient was taken to the recovery room in stable condition having tolerated the procedure well.  [name] 06/08/2022,5:08 PM

## 2022-06-08 NOTE — Progress Notes (Signed)
Initial Nutrition Assessment  DOCUMENTATION CODES:   Morbid obesity  INTERVENTION:   Double protein with meal trays   MVI po daily   Vitamin C 250mg  po BID  Zinc 220mg  po daily x 14 days  Diabetes diet education   NUTRITION DIAGNOSIS:   Increased nutrient needs related to wound healing as evidenced by estimated needs.  GOAL:   Patient will meet greater than or equal to 90% of their needs  MONITOR:   PO intake, Supplement acceptance, Labs, Weight trends, I & O's, Skin  REASON FOR ASSESSMENT:   Consult Wound healing  ASSESSMENT:   57 y/o male with h/o DM, OSA, CHF, COPD, HTN, PVD and GERD who is admitted with osteomyelitis right second toe with overlying ulcer.  Met with pt in room today. Pt reports good appetite and oral intake pta and in hospital. Pt reports that he is hungry today. Pt is currently NPO for angiogram and possible intervention today. RD discussed with pt the importance of adequate nutrition needed to preserve lean muscle and support wound healing. Pt would like to have double protein with meal trays. RD will add vitamins to support wound healing. RD provided pt with diabetic diet education today focusing on how uncontrolled DM effects wound healing.   Per chart, pt with weight gain pta. Will monitor daily weights.    Medications reviewed and include: aspirin, insulin, MVI, protonix, torsemide, zinc  Labs reviewed: K 3.9 wnl Cbgs- 123, 108, 104 x 24 hrs  AIC 7.4(H)- 5/7  NUTRITION - FOCUSED PHYSICAL EXAM:  Flowsheet Row Most Recent Value  Orbital Region No depletion  Upper Arm Region No depletion  Thoracic and Lumbar Region No depletion  Buccal Region No depletion  Temple Region No depletion  Clavicle Bone Region No depletion  Clavicle and Acromion Bone Region No depletion  Scapular Bone Region No depletion  Dorsal Hand No depletion  Patellar Region No depletion  Anterior Thigh Region No depletion  Posterior Calf Region No depletion  Edema  (RD Assessment) Mild  Hair Reviewed  Eyes Reviewed  Mouth Reviewed  Skin Reviewed  Nails Reviewed   Diet Order:   Diet Order             Diet NPO time specified Except for: Sips with Meds  Diet effective now                  EDUCATION NEEDS:   Education needs have been addressed  Skin:  Skin Assessment: Reviewed RN Assessment  Last BM:  pta  Height:   Ht Readings from Last 1 Encounters:  06/08/22 6\' 2"  (1.88 m)    Weight:   Wt Readings from Last 1 Encounters:  06/08/22 (!) 183.5 kg    Ideal Body Weight:  86.4 kg  BMI:  Body mass index is 51.94 kg/m.  Estimated Nutritional Needs:   Kcal:  2700-3000kcal/day  Protein:  >135g/day  Fluid:  2.2-2.5L/day  Betsey Holiday MS, RD, LDN Please refer to [facility] for RD and/or RD on-call/weekend/after hours pager

## 2022-06-09 ENCOUNTER — Inpatient Hospital Stay: Payer: Medicare HMO | Admitting: Anesthesiology

## 2022-06-09 ENCOUNTER — Inpatient Hospital Stay: Payer: Medicare HMO

## 2022-06-09 ENCOUNTER — Encounter: Payer: Self-pay | Admitting: Internal Medicine

## 2022-06-09 ENCOUNTER — Encounter
Admission: EM | Disposition: A | Discharge status: Home / Self Care | Payer: Self-pay | Source: Home / Self Care | Attending: Internal Medicine

## 2022-06-09 DIAGNOSIS — L089 Local infection of the skin and subcutaneous tissue, unspecified: Secondary | ICD-10-CM | POA: Diagnosis not present

## 2022-06-09 DIAGNOSIS — M869 Osteomyelitis, unspecified: Secondary | ICD-10-CM

## 2022-06-09 DIAGNOSIS — E11628 Type 2 diabetes mellitus with other skin complications: Secondary | ICD-10-CM | POA: Diagnosis not present

## 2022-06-09 HISTORY — PX: AMPUTATION TOE: SHX6595

## 2022-06-09 LAB — CBC WITH DIFFERENTIAL/PLATELET
Abs Immature Granulocytes: 0.03 10*3/uL (ref 0.00–0.07)
Basophils Absolute: 0.1 10*3/uL (ref 0.0–0.1)
Basophils Relative: 1 %
Eosinophils Absolute: 0.7 10*3/uL — ABNORMAL HIGH (ref 0.0–0.5)
Eosinophils Relative: 7 %
HCT: 40 % (ref 39.0–52.0)
Hemoglobin: 12 g/dL — ABNORMAL LOW (ref 13.0–17.0)
Immature Granulocytes: 0 %
Lymphocytes Relative: 17 %
Lymphs Abs: 1.7 10*3/uL (ref 0.7–4.0)
MCH: 24.9 pg — ABNORMAL LOW (ref 26.0–34.0)
MCHC: 30 g/dL (ref 30.0–36.0)
MCV: 83 fL (ref 80.0–100.0)
Monocytes Absolute: 1 10*3/uL (ref 0.1–1.0)
Monocytes Relative: 10 %
Neutro Abs: 6.2 10*3/uL (ref 1.7–7.7)
Neutrophils Relative %: 65 %
Platelets: 305 10*3/uL (ref 150–400)
RBC: 4.82 MIL/uL (ref 4.22–5.81)
RDW: 14.8 % (ref 11.5–15.5)
WBC: 9.7 10*3/uL (ref 4.0–10.5)
nRBC: 0.3 % — ABNORMAL HIGH (ref 0.0–0.2)

## 2022-06-09 LAB — BASIC METABOLIC PANEL
Anion gap: 9 (ref 5–15)
BUN: 18 mg/dL (ref 6–20)
CO2: 30 mmol/L (ref 22–32)
Calcium: 8.7 mg/dL — ABNORMAL LOW (ref 8.9–10.3)
Chloride: 104 mmol/L (ref 98–111)
Creatinine, Ser: 1.16 mg/dL (ref 0.61–1.24)
GFR, Estimated: >60 mL/min (ref >60)
Glucose, Bld: 157 mg/dL — ABNORMAL HIGH (ref 70–99)
Potassium: 4.2 mmol/L (ref 3.5–5.1)
Sodium: 143 mmol/L (ref 135–145)

## 2022-06-09 LAB — GLUCOSE, CAPILLARY
Glucose-Capillary: 170 mg/dL — ABNORMAL HIGH (ref 70–99)
Glucose-Capillary: 136 mg/dL — ABNORMAL HIGH (ref 70–99)
Glucose-Capillary: 144 mg/dL — ABNORMAL HIGH (ref 70–99)
Glucose-Capillary: 212 mg/dL — ABNORMAL HIGH (ref 70–99)

## 2022-06-09 SURGERY — AMPUTATION, TOE
Anesthesia: General | Site: Toe | Laterality: Right

## 2022-06-09 MED ORDER — MIDAZOLAM HCL 2 MG/2ML IJ SOLN
INTRAMUSCULAR | Status: DC | PRN
  Administered 2022-06-09: 2 mg via INTRAVENOUS

## 2022-06-09 MED ORDER — PROPOFOL 10 MG/ML IV BOLUS
INTRAVENOUS | Status: AC
  Filled 2022-06-09: qty 20

## 2022-06-09 MED ORDER — 0.9 % SODIUM CHLORIDE (POUR BTL) OPTIME
TOPICAL | Status: DC | PRN
  Administered 2022-06-09: 500 mL

## 2022-06-09 MED ORDER — CEFAZOLIN SODIUM 1 G IJ SOLR
INTRAMUSCULAR | Status: AC
  Filled 2022-06-09: qty 30

## 2022-06-09 MED ORDER — ONDANSETRON HCL 4 MG/2ML IJ SOLN
INTRAMUSCULAR | Status: AC
  Filled 2022-06-09: qty 2

## 2022-06-09 MED ORDER — PROPOFOL 1000 MG/100ML IV EMUL
INTRAVENOUS | Status: AC
  Filled 2022-06-09: qty 100

## 2022-06-09 MED ORDER — OXYCODONE HCL 5 MG PO TABS: 5.0000 mg | ORAL_TABLET | Freq: Once | ORAL | Status: DC | PRN

## 2022-06-09 MED ORDER — FENTANYL CITRATE (PF) 100 MCG/2ML IJ SOLN: 25.0000 ug | INTRAMUSCULAR | Status: DC | PRN

## 2022-06-09 MED ORDER — BUPIVACAINE HCL (PF) 0.5 % IJ SOLN
INTRAMUSCULAR | Status: DC | PRN
  Administered 2022-06-09: 10 mL

## 2022-06-09 MED ORDER — ONDANSETRON HCL 4 MG/2ML IJ SOLN
INTRAMUSCULAR | Status: DC | PRN
  Administered 2022-06-09: 4 mg via INTRAVENOUS

## 2022-06-09 MED ORDER — DOXYCYCLINE HYCLATE 100 MG PO TABS: 100.0000 mg | ORAL_TABLET | Freq: Two times a day (BID) | ORAL | 0 refills | Status: AC

## 2022-06-09 MED ORDER — LIDOCAINE-EPINEPHRINE 1 %-1:100000 IJ SOLN
INTRAMUSCULAR | Status: AC
  Filled 2022-06-09: qty 1

## 2022-06-09 MED ORDER — BUPIVACAINE HCL (PF) 0.5 % IJ SOLN
INTRAMUSCULAR | Status: AC
  Filled 2022-06-09: qty 30

## 2022-06-09 MED ORDER — LIDOCAINE HCL (PF) 1 % IJ SOLN
INTRAMUSCULAR | Status: AC
  Filled 2022-06-09: qty 30

## 2022-06-09 MED ORDER — DOXYCYCLINE HYCLATE 100 MG PO TABS: 100.0000 mg | ORAL_TABLET | Freq: Two times a day (BID) | ORAL | Status: DC

## 2022-06-09 MED ORDER — MIDAZOLAM HCL 2 MG/2ML IJ SOLN
INTRAMUSCULAR | Status: AC
  Filled 2022-06-09: qty 2

## 2022-06-09 MED ORDER — DEXTROSE 5 % IV SOLN
INTRAVENOUS | Status: DC | PRN
  Administered 2022-06-09: 3 g via INTRAVENOUS

## 2022-06-09 MED ORDER — OXYCODONE HCL 5 MG/5ML PO SOLN: 5.0000 mg | Freq: Once | ORAL | Status: DC | PRN

## 2022-06-09 MED ORDER — PROPOFOL 500 MG/50ML IV EMUL
INTRAVENOUS | Status: DC | PRN
  Administered 2022-06-09: 50 ug/kg/min via INTRAVENOUS

## 2022-06-09 SURGICAL SUPPLY — 46 items
BAG COUNTER SPONGE SURGICOUNT (BAG) IMPLANT
BASIN KIT SINGLE STR (MISCELLANEOUS) ×1 IMPLANT
BLADE OSC/SAGITTAL MD 5.5X18 (BLADE) IMPLANT
BLADE SURG 15 STRL LF DISP TIS (BLADE) ×1 IMPLANT
BLADE SURG 15 STRL SS (BLADE) ×1
BLADE SURG MINI STRL (BLADE) ×1 IMPLANT
BNDG COHESIVE 4X5 TAN STRL LF (GAUZE/BANDAGES/DRESSINGS) ×1 IMPLANT
BNDG ELASTIC 4INX 5YD STR LF (GAUZE/BANDAGES/DRESSINGS) ×1 IMPLANT
BNDG ESMARCH 4 X 12 STRL LF (GAUZE/BANDAGES/DRESSINGS) ×1
BNDG ESMARCH 4X12 STRL LF (GAUZE/BANDAGES/DRESSINGS) ×1 IMPLANT
BNDG GAUZE DERMACEA FLUFF 4 (GAUZE/BANDAGES/DRESSINGS) ×2 IMPLANT
COVER LIGHT HANDLE STERIS (MISCELLANEOUS) ×2 IMPLANT
CUFF TOURN SGL QUICK 18X4 (TOURNIQUET CUFF) ×1 IMPLANT
DRAPE FLUOR MINI C-ARM 54X84 (DRAPES) ×1 IMPLANT
DURAPREP 26ML APPLICATOR (WOUND CARE) ×1 IMPLANT
ELECT REM PT RETURN 9FT ADLT (ELECTROSURGICAL) ×1
ELECTRODE REM PT RTRN 9FT ADLT (ELECTROSURGICAL) ×1 IMPLANT
GAUZE PAD ABD 8X10 STRL (GAUZE/BANDAGES/DRESSINGS) ×1 IMPLANT
GAUZE SPONGE 4X4 12PLY STRL (GAUZE/BANDAGES/DRESSINGS) IMPLANT
GLOVE BIO SURGEON STRL SZ7 (GLOVE) ×1 IMPLANT
GLOVE BIOGEL PI IND STRL 7.5 (GLOVE) ×1 IMPLANT
GOWN STRL REUS W/ TWL LRG LVL3 (GOWN DISPOSABLE) ×2 IMPLANT
GOWN STRL REUS W/ TWL XL LVL3 (GOWN DISPOSABLE) ×1 IMPLANT
GOWN STRL REUS W/TWL LRG LVL3 (GOWN DISPOSABLE) ×2
GOWN STRL REUS W/TWL XL LVL3 (GOWN DISPOSABLE) ×1
HANDPIECE INTERPULSE COAX TIP (DISPOSABLE)
HANDPIECE VERSAJET DEBRIDEMENT (MISCELLANEOUS) IMPLANT
IV NS 1000ML (IV SOLUTION)
IV NS 1000ML BAXH (IV SOLUTION) ×1 IMPLANT
IV NS 500ML (IV SOLUTION) ×1
IV NS 500ML BAXH (IV SOLUTION) IMPLANT
KIT TURNOVER KIT A (KITS) IMPLANT
MANIFOLD NEPTUNE II (INSTRUMENTS) ×1 IMPLANT
NDL HYPO 22X1.5 SAFETY MO (MISCELLANEOUS) IMPLANT
NEEDLE HYPO 22X1.5 SAFETY MO (MISCELLANEOUS) IMPLANT
NS IRRIG 1000ML POUR BTL (IV SOLUTION) ×1 IMPLANT
PACK EXTREMITY ARMC (MISCELLANEOUS) ×1 IMPLANT
PAD ARMBOARD 7.5X6 YLW CONV (MISCELLANEOUS) ×2 IMPLANT
SET HNDPC FAN SPRY TIP SCT (DISPOSABLE) ×1 IMPLANT
STAPLER SKIN PROX 35W (STAPLE) ×1 IMPLANT
STOCKINETTE STRL 3IN 960336 (SOFTGOODS) IMPLANT
SUT PROLENE 2 0 FS (SUTURE) ×1 IMPLANT
SUT PROLENE 3 0 PS 2 (SUTURE) ×1 IMPLANT
SWAB CULTURE AMIES ANAERIB BLU (MISCELLANEOUS) IMPLANT
SYR CONTROL 10ML LL (SYRINGE) IMPLANT
TRAP FLUID SMOKE EVACUATOR (MISCELLANEOUS) ×1 IMPLANT

## 2022-06-09 NOTE — Anesthesia Procedure Notes (Addendum)
Date/Time: 06/09/2022 12:00 PM  Performed by: Tachina Spoonemore, [place], CRNAPre-anesthesia Checklist: Patient identified, Emergency Drugs available, Suction available, Patient being monitored and Timeout performed Patient Re-evaluated:Patient Re-evaluated prior to induction Oxygen Delivery Method: Simple face mask Preoxygenation: Pre-oxygenation with 100% oxygen Induction Type: IV induction

## 2022-06-09 NOTE — Anesthesia Postprocedure Evaluation (Signed)
Anesthesia Post Note  Patient: Raymond [name]  Procedure(s) Performed: AMPUTATION 2ND TOE RIGHT FOOT (Right: Toe)  Patient location during evaluation: PACU Anesthesia Type: General Level of consciousness: awake and alert Pain management: pain level controlled Vital Signs Assessment: post-procedure vital signs reviewed and stable Respiratory status: spontaneous breathing, nonlabored ventilation, respiratory function stable and patient connected to nasal cannula oxygen Cardiovascular status: blood pressure returned to baseline and stable Postop Assessment: no apparent nausea or vomiting Anesthetic complications: no   No notable events documented.   Last Vitals:  Vitals:   06/09/22 1300 06/09/22 1315  BP: 128/73 135/73  Pulse: 64 60  Resp: 16 (!) 0  Temp:  (!) 36.1 C  SpO2: 96% 99%    Last Pain:  Vitals:   06/09/22 1315  TempSrc:   PainSc: 0-No pain                 Cleda Mccreedy Josip Merolla

## 2022-06-09 NOTE — Discharge Summary (Signed)
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 male with medical history significant of hypertension, type 2 diabetes, HFpEF, COPD with chronic hypoxic respiratory failure on 2 L, OSA on CPAP, hyperlipidemia, cardiomegaly, who presents to the ED due to foot pain.   [name] states that he has been dealing with bilateral lower extremity wounds for an extended period of time now, however his right second digit developed a new ulcer on the top approximately 1 week ago.  He has not noticed any drainage other than intermittent bleeding.  He denies any redness or warmth.  He denies any fever, chills, nausea, vomiting, diarrhea, abdominal pain, chest pain, palpitations or shortness of breath.  His podiatrist recommended he come in today for further evaluation.   ED course: On arrival to the ED, patient was hypertensive at 159/69 with heart rate of 80.  He was saturating at 91%.  He was afebrile at 98.  Initial workup notable for WBC of 10.5, hemoglobin of 12.0, glucose of 179, creatinine 1.16 with GFR above 60.  Lactic acid within normal limits at 1.9.  X-ray of the right foot was obtained with no evidence of osteomyelitis.  Podiatry consulted and requesting IV antibiotics be initiated.  [facility] contacted for admission."   Patient underwent vascular studies by  vascular surgeon and was cleared for amputation by podiatrist.  Patient has subsequently been cleared for discharge today by podiatrist after amputation of second right toe performed today.  Podiatrist also recommended for patient to be on doxycycline orally for 10 days.  According to podiatry recs dressing should not be changed until patient follows up at the clinic in 1 week.  He is able to bear weight in surgical shoes as tolerated All of these have been explained to patient.  He expressed understanding and questions answered appropriately   Consultants: Vascular surgery, podiatry Procedures performed: Right second toe amputation Disposition: Home Diet recommendation:  Discharge Diet Orders (From admission, onward)     Start     Ordered   06/09/22 0000  Diet - low sodium heart healthy        06/09/22 1621           Carb modified diet DISCHARGE MEDICATION: Allergies as of 06/09/2022   No Known Allergies      Medication List     STOP taking these medications    HYDROcodone-acetaminophen 5-325 MG tablet Commonly known as: NORCO/VICODIN   insulin detemir 100 UNIT/ML injection Commonly known as: LEVEMIR   valsartan 160 MG tablet Commonly known as: DIOVAN       TAKE these medications    Accu-Chek SmartView test strip Generic drug: glucose blood   aspirin EC 81 MG tablet Take 81 mg by mouth daily.   atorvastatin 40 MG tablet Commonly known as: LIPITOR atorvastatin 40 mg tablet   carvedilol 6.25 MG tablet Commonly known as: COREG Take 1 tablet (6.25 mg  total) by mouth 2 (two) times daily.   Centrum Silver 50+Men Tabs Take 1 tablet by mouth daily.   doxycycline 100 MG tablet Commonly known as: VIBRA-TABS Take 1 tablet (100 mg total) by mouth every 12 (twelve) hours for 10 days.   empagliflozin 25 MG Tabs tablet Commonly known as: JARDIANCE Take by mouth daily. Once daily in the am   gabapentin 300 MG capsule Commonly known as: NEURONTIN Take 300 mg by  mouth 3 (three) times daily.   insulin lispro 100 UNIT/ML cartridge Commonly known as: HUMALOG Inject 60 Units into the skin 3 (three) times daily with meals.   Lantus 100 UNIT/ML injection Generic drug: insulin glargine Inject 90 Units into the skin 2 (two) times daily.   liraglutide 18 MG/3ML Sopn Commonly known as: VICTOZA Inject 1.2 mg into the skin in the morning and at bedtime.   losartan 50 MG tablet Commonly known as: COZAAR Take 50 mg by mouth daily.   metFORMIN 1000 MG tablet Commonly known as: GLUCOPHAGE Take 1,000 mg by mouth 2 (two) times daily with a meal.   omega-3 acid ethyl esters 1 g capsule Commonly known as: LOVAZA Take 1 g by mouth daily.   OXYGEN Inhale 2 L into the lungs daily.   pantoprazole 40 MG tablet Commonly known as: PROTONIX Take 40 mg by mouth daily.   torsemide 20 MG tablet Commonly known as: DEMADEX Take 2 tablets (40 mg total) by mouth daily.   TRUEplus Insulin Syringe 29G X 1/2" 1 ML Misc Generic drug: INSULIN SYRINGE 1CC/29G   TRUEplus Pen Needles 31G X 8 MM Misc Generic drug: Insulin Pen Needle        Follow-up Information     Candelaria Stagers, DPM. Schedule an appointment as soon as possible for a visit in 1 week(s).   Specialty: Podiatry Contact information: [address] [place] [address] [phone]                Discharge Exam: [name] Weights   06/08/22 1432 06/08/22 1448 06/09/22 0500  Weight: (!) 183.5 kg (!) 183.5 kg (!) 184 kg   Physical Exam: Constitutional:      General: He is not in acute distress.    Appearance: He is obese. He is not toxic-appearing.  HENT:     Head: Normocephalic and atraumatic.     Mouth/Throat:     Mouth: Mucous membranes are moist.     Pharynx: Oropharynx is clear.  Cardiovascular:     Rate and Rhythm: Normal rate and regular rhythm.     Heart sounds: No murmur heard.    No gallop.  Pulmonary:     Effort: Pulmonary effort is normal. No respiratory distress.   Musculoskeletal:     Comments: Right foot in dressing postop Neurological:     Mental Status: He is alert and oriented to person, place, and time. Mental status is at baseline.  Psychiatric:        Mood and Affect: Mood normal.        Behavior: Behavior normal.   Condition at discharge: good Discharge time spent: greater than 30 minutes.  Signed: Loyce Dys, MD [facility] 06/09/2022

## 2022-06-09 NOTE — Progress Notes (Signed)
Patient returned from PACU

## 2022-06-09 NOTE — Anesthesia Preprocedure Evaluation (Signed)
Anesthesia Evaluation  Patient identified by MRN, date of birth, ID band Patient awake    Reviewed: Allergy & Precautions, NPO status , Patient's Chart, lab work & pertinent test results  History of Anesthesia Complications Negative for: history of anesthetic complications  Airway Mallampati: III  TM Distance: >3 FB Neck ROM: full    Dental  (+) Chipped, Poor Dentition, Missing   Pulmonary shortness of breath and with exertion, sleep apnea , COPD   Pulmonary exam normal        Cardiovascular hypertension, Normal cardiovascular exam     Neuro/Psych negative neurological ROS  negative psych ROS   GI/Hepatic Neg liver ROS,GERD  Controlled,,  Endo/Other  diabetes, Type 2, Insulin Dependent    Renal/GU Renal disease  negative genitourinary   Musculoskeletal   Abdominal   Peds  Hematology negative hematology ROS (+)   Anesthesia Other Findings Past Medical History: No date: Arthritis     Comment:  hands No date: Cellulitis No date: CHF (congestive heart failure) (HCC) No date: Chronic diastolic heart failure (HCC) No date: Chronic respiratory failure with hypoxia (HCC)     Comment:  2 L [place] continuously No date: Diabetes mellitus type 2, insulin dependent (HCC) No date: Edema     Comment:  FEET/LEGS No date: GERD (gastroesophageal reflux disease) No date: Hepatic steatosis No date: HOH (hard of hearing) No date: Hypertension No date: Morbid obesity (HCC) No date: Morbid obesity with BMI of 45.0-49.9, adult (HCC) No date: Neuropathy No date: Orthopnea No date: OSA on CPAP     Comment:  TRILOGY VENTILATOR No date: Oxygen deficiency     Comment:  2L  HS No date: PVD (peripheral vascular disease) (HCC) No date: Shortness of breath dyspnea No date: Systolic heart failure (HCC)     Comment:  Preserved EF 50-55%  Past Surgical History: 10/02/2014: AMPUTATION; Right     Comment:  Procedure: AMPUTATION RAY;   Surgeon: Recardo Evangelist,               DPM;  Location: [facility] ORS;  Service: Podiatry;                Laterality: Right; 09/10/2015: CATARACT EXTRACTION W/PHACO; Right     Comment:  Procedure: CATARACT EXTRACTION PHACO AND INTRAOCULAR               LENS PLACEMENT (IOC);  Surgeon: Galen Manila, MD;                Location: [facility] ORS;  Service: Ophthalmology;  Laterality:              Right;  [place] 01:34 AP% 19.3 CDE 18.36 Fluid pack lot #               [id number] 10/06/2015: CATARACT EXTRACTION W/PHACO; Left     Comment:  Procedure: CATARACT EXTRACTION PHACO AND INTRAOCULAR               LENS PLACEMENT (IOC);  Surgeon: Galen Manila, MD;                Location: [facility] ORS;  Service: Ophthalmology;  Laterality:              Left;  [place] 00:56 AP% 19.5 CDE 11.02 Fluid Pack #               [id number] No date: CHOLECYSTECTOMY 05/17/2016: COLONOSCOPY WITH PROPOFOL; N/A     Comment:  Procedure: COLONOSCOPY WITH PROPOFOL;  Surgeon: Cindra Eves  Marva Panda, MD;  Location: [facility] ENDOSCOPY;  Service:               Endoscopy;  Laterality: N/A; 12/27/2021: COLONOSCOPY WITH PROPOFOL; N/A     Comment:  Procedure: COLONOSCOPY WITH PROPOFOL;  Surgeon: Wyline Mood, MD;  Location: [facility] ENDOSCOPY;  Service:               Gastroenterology;  Laterality: N/A; No date: EYE SURGERY; Left     Comment:  bilat laser No date: HIP SURGERY; Right     Comment:  8 th grade pins 06/08/2022: LOWER EXTREMITY ANGIOGRAPHY; Right     Comment:  Procedure: Lower Extremity Angiography;  Surgeon:               Renford Dills, MD;  Location: [facility] INVASIVE CV LAB;               Service: Cardiovascular;  Laterality: Right; No date: NOSE SURGERY 10/02/2014: PERIPHERAL VASCULAR CATHETERIZATION; Right     Comment:  Procedure: Lower Extremity Angiography;  Surgeon: Annice Needy, MD;  Location: [facility] INVASIVE CV LAB;  Service:               Cardiovascular;  Laterality: Right; 2010: TOE AMPUTATION;  Left     Comment:  2cd 2009: TOE AMPUTATION; Right  BMI    Body Mass Index: 52.08 kg/m      Reproductive/Obstetrics negative OB ROS                             Anesthesia Physical Anesthesia Plan  ASA: 3  Anesthesia Plan: General   Post-op Pain Management:    Induction: Intravenous  PONV Risk Score and Plan: Propofol infusion and TIVA  Airway Management Planned: Natural Airway and Nasal Cannula  Additional Equipment:   Intra-op Plan:   Post-operative Plan:   Informed Consent: I have reviewed the patients History and Physical, chart, labs and discussed the procedure including the risks, benefits and alternatives for the proposed anesthesia with the patient or authorized representative who has indicated his/her understanding and acceptance.     Dental Advisory Given  Plan Discussed with: Anesthesiologist, CRNA and Surgeon  Anesthesia Plan Comments: (Patient consented for risks of anesthesia including but not limited to:  - adverse reactions to medications - risk of airway placement if required - damage to eyes, teeth, lips or other oral mucosa - nerve damage due to positioning  - sore throat or hoarseness - Damage to heart, brain, nerves, lungs, other parts of body or loss of life  Patient voiced understanding.)       Anesthesia Quick Evaluation

## 2022-06-09 NOTE — Op Note (Signed)
Surgeon: Surgeon(s): Candelaria Stagers, DPM  Assistants: None Pre-operative diagnosis: diabetic foot infection, right foot  Post-operative diagnosis: same Procedure: Procedure(s) (LRB): AMPUTATION 2ND TOE RIGHT FOOT (Right)  Pathology: * No specimens in log *  Pertinent Intra-op findings: Right significant osteomyelitis of the second distal digits noted* Anesthesia: Choice  Hemostasis: * No tourniquets in log * EBL: Minimal Materials: 3-0 Prolene Injectables: 10 cc of half percent Marcaine plain Complications: None  Indications for surgery: A 57 y.o. male presents with right second digit osteomyelitis. Patient has failed all conservative therapy including but not limited to local wound care antibiotics. He wishes to have surgical correction of the foot/deformity. It was determined that patient would benefit from right second digit amputation. Informed surgical risk consent was reviewed and read aloud to the patient.  I reviewed the films.  I have discussed my findings with the patient in great detail.  I have discussed all risks including but not limited to infection, stiffness, scarring, limp, disability, deformity, damage to blood vessels and nerves, numbness, poor healing, need for braces, arthritis, chronic pain, amputation, death.  All benefits and realistic expectations discussed in great detail.  I have made no promises as to the outcome.  I have provided realistic expectations.  I have offered the patient a 2nd opinion, which they have declined and assured me they preferred to proceed despite the risks   Procedure in detail: The patient was both verbally and visually identified by myself, the nursing staff, and anesthesia staff in the preoperative holding area. They were then transferred to the operating room and placed on the operative table in supine position.  Attention was directed right second digit, using skin marker fishmouth style incision was delineated.  Using #15 mid incision  was carried down from epidermal junction down to the level of the bone.  The toe was disarticulated at the metatarsophalangeal joint.  No further signs of osteomyelitis noted.  Hard indurated bone noted.  The bone was sent to pathology.  The wound was thoroughly irrigated.  The it was determined the patient wound can be primarily closed.  The wound using 3-0 Prolene the wound was primarily closed.  The incision was dressed with Xeroform Kerlix Ace bandage.  All bony prominences were adequately padded  Disposition Patient is okay to discharge from podiatric standpoint.  10 days of doxycycline.  Weightbearing as tolerated in surgical shoe.  No dressing change until follow-up phone follow-up 1 week from discharge  At the conclusion of the procedure the patient was awoken from anesthesia and found to have tolerated the procedure well any complications. There were transferred to PACU with vital signs stable and vascular status intact.  Nicholes Rough, DPM

## 2022-06-09 NOTE — Transfer of Care (Signed)
Immediate Anesthesia Transfer of Care Note  Patient: Raymond [name]  Procedure(s) Performed: AMPUTATION 2ND TOE RIGHT FOOT (Right: Toe)  Patient Location: PACU  Anesthesia Type:MAC  Level of Consciousness: drowsy  Airway & Oxygen Therapy: Patient Spontanous Breathing and Patient connected to face mask oxygen  Post-op Assessment: Report given to RN and Post -op Vital signs reviewed and stable  Post vital signs: Reviewed and stable  Last Vitals:  Vitals Value Taken Time  BP 123/67 06/09/22 1233  Temp 97.0   Pulse 60 06/09/22 1236  Resp 17 06/09/22 1236  SpO2 98 % 06/09/22 1236  Vitals shown include unvalidated device data.  Last Pain:  Vitals:   06/09/22 1108  TempSrc: Oral  PainSc: 0-No pain         Complications: No notable events documented.

## 2022-06-09 NOTE — Progress Notes (Signed)
  Progress Note    06/09/2022 10:17 AM 1 Day Post-Op  Subjective: Raymond [name] is a 57 year old male who is now status post op day #1 from abdominal aortogram with right lower extremity angiogram.  Patient is resting comfortably in bed this morning.  No complaints overnight.  Left groin with dressing was clean dry and intact no hematoma no seroma to note.  Is all remained stable.   Vitals:   06/09/22 0300 06/09/22 0844  BP: (!) 150/40 (!) 138/43  Pulse: 87 71  Resp: 20 18  Temp: 99.5 F (37.5 C) 98.7 F (37.1 C)  SpO2: 90% 90%   Physical Exam: Cardiac:  RRR, Normal S1, S2  Lungs:  Clear on auscultation, no rhonchi, rales or wheezing Incisions:  Left Groin with dressing clean dry and intact.  Extremities:  Right lower extremity with sore to second toe. Positive palpable pulses.  Abdomen:  Positive bowel sounds, soft, non tender non distended Neurologic: AAOX3 follows all commands appropriately.   CBC    Component Value Date/Time   WBC 9.7 06/09/2022 0730   RBC 4.82 06/09/2022 0730   HGB 12.0 (L) 06/09/2022 0730   HGB 12.6 (L) 05/02/2011 1353   HCT 40.0 06/09/2022 0730   HCT 38.0 (L) 05/02/2011 1353   PLT 305 06/09/2022 0730   PLT 315 05/02/2011 1353   MCV 83.0 06/09/2022 0730   MCV 85 05/02/2011 1353   MCH 24.9 (L) 06/09/2022 0730   MCHC 30.0 06/09/2022 0730   RDW 14.8 06/09/2022 0730   RDW 15.1 (H) 05/02/2011 1353   LYMPHSABS 1.7 06/09/2022 0730   LYMPHSABS 2.1 05/02/2011 1353   MONOABS 1.0 06/09/2022 0730   MONOABS 0.6 05/02/2011 1353   EOSABS 0.7 (H) 06/09/2022 0730   EOSABS 0.3 05/02/2011 1353   BASOSABS 0.1 06/09/2022 0730   BASOSABS 0.0 05/02/2011 1353    BMET    Component Value Date/Time   NA 143 06/09/2022 0730   NA 142 05/02/2011 1353   K 4.2 06/09/2022 0730   K 4.2 05/02/2011 1353   CL 104 06/09/2022 0730   CL 104 05/02/2011 1353   CO2 30 06/09/2022 0730   CO2 28 05/02/2011 1353   GLUCOSE 157 (H) 06/09/2022 0730   GLUCOSE 179 (H)  05/02/2011 1353   BUN 18 06/09/2022 0730   BUN 19 (H) 05/02/2011 1353   CREATININE 1.16 06/09/2022 0730   CREATININE 1.14 10/10/2011 1743   CALCIUM 8.7 (L) 06/09/2022 0730   CALCIUM 8.8 05/02/2011 1353   GFRNONAA >60 06/09/2022 0730   GFRNONAA >60 10/10/2011 1743   GFRAA >60 04/30/2017 0634   GFRAA >60 10/10/2011 1743    INR    Component Value Date/Time   INR 1.07 04/28/2017 1214     Intake/Output Summary (Last 24 hours) at 06/09/2022 1017 Last data filed at 06/09/2022 0852 Gross per 24 hour  Intake --  Output 1800 ml  Net -1800 ml     Assessment/Plan:  57 y.o. male is s/p Aortogram with right lower extremity angiogram 1 Day Post-Op   PLAN: Patient is recovering as expected this morning.  Patient has adequate blood flow for any surgical intervention or amputation by podiatry at this time. Vascular surgery will sign off this case.  If needed again please reconsult thank you  DVT prophylaxis:  ASA 81 mg Daily   Estel Tonelli R Trinton Prewitt [facility] 06/09/2022 10:17 AM

## 2022-06-09 NOTE — Interval H&P Note (Signed)
History and Physical Interval Note:  06/09/2022 8:30 AM  Raymond [name]  has presented today for surgery, with the diagnosis of diabetic foot infection, right foot.  The various methods of treatment have been discussed with the patient and family. After consideration of risks, benefits and other options for treatment, the patient has consented to  Procedure(s): AMPUTATION 2ND TOE RIGHT FOOT (Right) as a surgical intervention.  The patient's history has been reviewed, patient examined, no change in status, stable for surgery.  I have reviewed the patient's chart and labs.  Questions were answered to the patient's satisfaction.     Candelaria Stagers

## 2022-06-10 ENCOUNTER — Encounter: Payer: Self-pay | Admitting: Podiatry

## 2022-06-13 LAB — SURGICAL PATHOLOGY

## 2022-06-16 ENCOUNTER — Ambulatory Visit: Payer: Medicare HMO | Admitting: Podiatry

## 2022-06-16 DIAGNOSIS — Z89421 Acquired absence of other right toe(s): Secondary | ICD-10-CM | POA: Diagnosis not present

## 2022-06-16 NOTE — Progress Notes (Signed)
Subjective:  [name], male    DOB: May 14, 1965,  MRN: [id number]  Chief Complaint  Patient presents with   Routine Post Op    DOS: 06/09/2022 Procedure: Right second digit amputation  57 y.o. male returns for post-op check.  Patient says doing okay.  Has been not walking on it with a surgical shoe is a diabetic bandages has some strikethrough.  Denies any other acute complaints no nausea fever chills vomiting  Review of Systems: Negative except as noted in the HPI. Denies N/V/F/Ch.  Past Medical History:  Diagnosis Date   Arthritis    hands   Cellulitis    CHF (congestive heart failure) (HCC)    Chronic diastolic heart failure (HCC)    Chronic respiratory failure with hypoxia (HCC)    2 L [place] continuously   Diabetes mellitus type 2, insulin dependent (HCC)    Edema    FEET/LEGS   GERD (gastroesophageal reflux disease)    Hepatic steatosis    HOH (hard of hearing)    Hypertension    Morbid obesity (HCC)    Morbid obesity with BMI of 45.0-49.9, adult (HCC)    Neuropathy    Orthopnea    OSA on CPAP    TRILOGY VENTILATOR   Oxygen deficiency    2L  HS   PVD (peripheral vascular disease) (HCC)    Shortness of breath dyspnea    Systolic heart failure (HCC)    Preserved EF 50-55%    Current Outpatient Medications:    ACCU-CHEK SMARTVIEW test strip, , Disp: , Rfl:    aspirin EC 81 MG tablet, Take 81 mg by mouth daily., Disp: , Rfl:    atorvastatin (LIPITOR) 40 MG tablet, atorvastatin 40 mg tablet, Disp: , Rfl:    carvedilol (COREG) 6.25 MG tablet, Take 1 tablet (6.25 mg total) by mouth 2 (two) times daily., Disp: 60 tablet, Rfl: 11   doxycycline (VIBRA-TABS) 100 MG tablet, Take 1 tablet (100 mg total) by mouth every 12 (twelve) hours for 10 days., Disp: 20 tablet, Rfl: 0   empagliflozin (JARDIANCE) 25 MG TABS tablet, Take by mouth daily. Once daily in the am, Disp: , Rfl:    gabapentin (NEURONTIN) 300 MG capsule, Take 300 mg by mouth 3 (three)  times daily., Disp: , Rfl:    insulin lispro (HUMALOG) 100 UNIT/ML cartridge, Inject 60 Units into the skin 3 (three) times daily with meals., Disp: , Rfl:    LANTUS 100 UNIT/ML injection, Inject 90 Units into the skin 2 (two) times daily., Disp: , Rfl:    liraglutide (VICTOZA) 18 MG/3ML SOPN, Inject 1.2 mg into the skin in the morning and at bedtime., Disp: , Rfl:    losartan (COZAAR) 50 MG tablet, Take 50 mg by mouth daily., Disp: , Rfl:    metFORMIN (GLUCOPHAGE) 1000 MG tablet, Take 1,000 mg by mouth 2 (two) times daily with a meal., Disp: , Rfl:    Multiple Vitamins-Minerals (CENTRUM SILVER 50+MEN) TABS, Take 1 tablet by mouth daily., Disp: , Rfl:    omega-3 acid ethyl esters (LOVAZA) 1 g capsule, Take 1 g by mouth daily., Disp: , Rfl:    OXYGEN, Inhale 2 L into the lungs daily., Disp: , Rfl:    pantoprazole (PROTONIX) 40 MG tablet, Take 40 mg by mouth daily., Disp: , Rfl:    torsemide (DEMADEX) 20 MG tablet, Take 2 tablets (40 mg total) by mouth daily., Disp: 60 tablet, Rfl: 2   TRUEPLUS INSULIN SYRINGE 29G  X 1/2" 1 ML MISC, , Disp: , Rfl:    TRUEPLUS PEN NEEDLES 31G X 8 MM MISC, , Disp: , Rfl:   Social History   Tobacco Use  Smoking Status Never  Smokeless Tobacco Never    No Known Allergies Objective:  There were no vitals filed for this visit. There is no height or weight on file to calculate BMI. Constitutional Well developed. Well nourished.  Vascular Foot warm and well perfused. Capillary refill normal to all digits.   Neurologic Normal speech. Oriented to person, place, and time. Epicritic sensation to light touch grossly present bilaterally.  Dermatologic Skin healing well without signs of infection. Skin edges well coapted without signs of infection.  Orthopedic: Tenderness to palpation noted about the surgical site.   Radiographs: None Assessment:   1. History of amputation of lesser toe of right foot (HCC)    Plan:  Patient was evaluated and treated and all  questions answered.  S/p foot surgery right -Progressing as expected post-operatively. -XR: See above -WB Status: Weightbearing as tolerated in surgical shoe -Sutures: Intact.  No clinical signs of Deis is noted no complication noted. -Medications: None -Foot redressed.  No follow-ups on file.

## 2022-06-30 ENCOUNTER — Ambulatory Visit: Payer: Medicare HMO | Admitting: Podiatry

## 2022-06-30 VITALS — BP 151/69 | HR 78

## 2022-06-30 DIAGNOSIS — Z89421 Acquired absence of other right toe(s): Secondary | ICD-10-CM | POA: Diagnosis not present

## 2022-06-30 NOTE — Progress Notes (Signed)
Subjective:  [name], male    DOB: 04-04-1965,  MRN: [id number]  Chief Complaint  Patient presents with   Routine Post Op    DOS: 06/09/2022 Procedure: Right second digit amputation  57 y.o. male returns for post-op check.  Patient says doing okay.  Has been not walking on it with a surgical shoe is a diabetic bandages has some strikethrough.  Denies any other acute complaints no nausea fever chills vomiting  Review of Systems: Negative except as noted in the HPI. Denies N/V/F/Ch.  Past Medical History:  Diagnosis Date   Arthritis    hands   Cellulitis    CHF (congestive heart failure) (HCC)    Chronic diastolic heart failure (HCC)    Chronic respiratory failure with hypoxia (HCC)    2 L [place] continuously   Diabetes mellitus type 2, insulin dependent (HCC)    Edema    FEET/LEGS   GERD (gastroesophageal reflux disease)    Hepatic steatosis    HOH (hard of hearing)    Hypertension    Morbid obesity (HCC)    Morbid obesity with BMI of 45.0-49.9, adult (HCC)    Neuropathy    Orthopnea    OSA on CPAP    TRILOGY VENTILATOR   Oxygen deficiency    2L  HS   PVD (peripheral vascular disease) (HCC)    Shortness of breath dyspnea    Systolic heart failure (HCC)    Preserved EF 50-55%    Current Outpatient Medications:    ACCU-CHEK SMARTVIEW test strip, , Disp: , Rfl:    aspirin EC 81 MG tablet, Take 81 mg by mouth daily., Disp: , Rfl:    atorvastatin (LIPITOR) 40 MG tablet, atorvastatin 40 mg tablet, Disp: , Rfl:    carvedilol (COREG) 6.25 MG tablet, Take 1 tablet (6.25 mg total) by mouth 2 (two) times daily., Disp: 60 tablet, Rfl: 11   doxycycline (VIBRA-TABS) 100 MG tablet, Take 1 tablet (100 mg total) by mouth every 12 (twelve) hours for 10 days., Disp: 20 tablet, Rfl: 0   empagliflozin (JARDIANCE) 25 MG TABS tablet, Take by mouth daily. Once daily in the am, Disp: , Rfl:    gabapentin (NEURONTIN) 300 MG capsule, Take 300 mg by mouth 3 (three)  times daily., Disp: , Rfl:    insulin lispro (HUMALOG) 100 UNIT/ML cartridge, Inject 60 Units into the skin 3 (three) times daily with meals., Disp: , Rfl:    LANTUS 100 UNIT/ML injection, Inject 90 Units into the skin 2 (two) times daily., Disp: , Rfl:    liraglutide (VICTOZA) 18 MG/3ML SOPN, Inject 1.2 mg into the skin in the morning and at bedtime., Disp: , Rfl:    losartan (COZAAR) 50 MG tablet, Take 50 mg by mouth daily., Disp: , Rfl:    metFORMIN (GLUCOPHAGE) 1000 MG tablet, Take 1,000 mg by mouth 2 (two) times daily with a meal., Disp: , Rfl:    Multiple Vitamins-Minerals (CENTRUM SILVER 50+MEN) TABS, Take 1 tablet by mouth daily., Disp: , Rfl:    omega-3 acid ethyl esters (LOVAZA) 1 g capsule, Take 1 g by mouth daily., Disp: , Rfl:    OXYGEN, Inhale 2 L into the lungs daily., Disp: , Rfl:    pantoprazole (PROTONIX) 40 MG tablet, Take 40 mg by mouth daily., Disp: , Rfl:    torsemide (DEMADEX) 20 MG tablet, Take 2 tablets (40 mg total) by mouth daily., Disp: 60 tablet, Rfl: 2   TRUEPLUS INSULIN SYRINGE 29G  X 1/2" 1 ML MISC, , Disp: , Rfl:    TRUEPLUS PEN NEEDLES 31G X 8 MM MISC, , Disp: , Rfl:   Social History   Tobacco Use  Smoking Status Never  Smokeless Tobacco Never    No Known Allergies Objective:  There were no vitals filed for this visit. There is no height or weight on file to calculate BMI. Constitutional Well developed. Well nourished.  Vascular Foot warm and well perfused. Capillary refill normal to all digits.   Neurologic Normal speech. Oriented to person, place, and time. Epicritic sensation to light touch grossly present bilaterally.  Dermatologic Deep skin is healing well.  Patient has superficial dehiscence no purulent drainage nice granular fiber wound noted  Orthopedic: No further tenderness to palpation noted about the surgical site.   Radiographs: None Assessment:   1. History of amputation of lesser toe of right foot (HCC)    Plan:  Patient was  evaluated and treated and all questions answered.  S/p foot surgery right -Progressing as expected post-operatively. -XR: See above -WB Status: Weightbearing as tolerated in surgical shoe -Sutures: Removed.  Superficial Deis is noted of the wound. -Medications: None -Continue Betadine wet-to-dry dressing.  Given the nature of the dehiscence patient will benefit from wound care center.  Referral for the wound care center was placed.  No follow-ups on file.

## 2022-07-08 ENCOUNTER — Encounter: Payer: Medicare HMO | Attending: Physician Assistant | Admitting: Physician Assistant

## 2022-07-08 DIAGNOSIS — I5042 Chronic combined systolic (congestive) and diastolic (congestive) heart failure: Secondary | ICD-10-CM | POA: Diagnosis not present

## 2022-07-08 DIAGNOSIS — L97522 Non-pressure chronic ulcer of other part of left foot with fat layer exposed: Secondary | ICD-10-CM | POA: Diagnosis not present

## 2022-07-08 DIAGNOSIS — Z89421 Acquired absence of other right toe(s): Secondary | ICD-10-CM | POA: Insufficient documentation

## 2022-07-08 DIAGNOSIS — Z833 Family history of diabetes mellitus: Secondary | ICD-10-CM | POA: Insufficient documentation

## 2022-07-08 DIAGNOSIS — I110 Hypertensive heart disease with heart failure: Secondary | ICD-10-CM | POA: Insufficient documentation

## 2022-07-08 DIAGNOSIS — E1169 Type 2 diabetes mellitus with other specified complication: Secondary | ICD-10-CM | POA: Insufficient documentation

## 2022-07-08 DIAGNOSIS — Z89412 Acquired absence of left great toe: Secondary | ICD-10-CM | POA: Diagnosis not present

## 2022-07-08 DIAGNOSIS — M86372 Chronic multifocal osteomyelitis, left ankle and foot: Secondary | ICD-10-CM | POA: Insufficient documentation

## 2022-07-08 DIAGNOSIS — L97512 Non-pressure chronic ulcer of other part of right foot with fat layer exposed: Secondary | ICD-10-CM | POA: Diagnosis not present

## 2022-07-08 DIAGNOSIS — J449 Chronic obstructive pulmonary disease, unspecified: Secondary | ICD-10-CM | POA: Insufficient documentation

## 2022-07-08 DIAGNOSIS — X58XXXA Exposure to other specified factors, initial encounter: Secondary | ICD-10-CM | POA: Insufficient documentation

## 2022-07-08 DIAGNOSIS — T8131XA Disruption of external operation (surgical) wound, not elsewhere classified, initial encounter: Secondary | ICD-10-CM | POA: Diagnosis not present

## 2022-07-08 DIAGNOSIS — E11621 Type 2 diabetes mellitus with foot ulcer: Secondary | ICD-10-CM | POA: Insufficient documentation

## 2022-07-11 NOTE — Progress Notes (Signed)
Raymond, [name] ([id number]) [phone] [id number] Page 1 of 4 Visit Report for 07/08/2022 Abuse Risk Screen Details Patient Name: Date of Service: Raymond [name]. 07/08/2022 8:30 A M Medical Record Number: [id number] Patient Account Number: [id number] Date of Birth/Sex: Treating RN: October 19, 1965 (57 y.o. Raymond [name] Primary Care Charli Halle: Raymond [name] Other Clinician: Referring Migdalia Olejniczak: Treating Jariana Shumard/Extender: Valerie Salts Weeks in Treatment: 0 Abuse Risk Screen Items Answer Electronic Signature(s) Signed: 07/11/2022 11:39:18 AM By: Midge Aver MSN RN CNS WTA Entered By: Midge Aver on 07/08/2022 08:56:37 -------------------------------------------------------------------------------- Activities of Daily Living Details Patient Name: Date of Service: Raymond [name] 07/08/2022 8:30 A M Medical Record Number: [id number] Patient Account Number: [id number] Date of Birth/Sex: Treating RN: May 23, 1965 (57 y.o. Raymond [name] Primary Care Omkar Stratmann: Raymond [name] Other Clinician: Referring Culver Feighner: Treating Preet Mangano/Extender: Valerie Salts Weeks in Treatment: 0 Activities of Daily Living Items Answer Activities of Daily Living (Please select one for each item) Drive Automobile Not Able T Medications ake Completely Able Use T elephone Completely Able Care for Appearance Completely Able Use T oilet Completely Able Bath / Shower Completely Able Dress Self Completely Able Feed Self Completely Able Walk Completely Able Get In / Out Bed Completely Able Housework Completely Able Prepare Meals Completely Able Handle Money Completely Able Shop for Self Completely Able Electronic Signature(s) Signed: 07/11/2022 11:39:18 AM By: Midge Aver MSN RN CNS WTA Entered By: Midge Aver on 07/08/2022 08:57:01 -------------------------------------------------------------------------------- Education Screening  Details Patient Name: Date of Service: Raymond Blood D. 07/08/2022 8:30 A M Medical Record Number: [id number] Patient Account Number: [id number] Date of Birth/Sex: Treating RN: 12-Mar-1965 (57 y.o. Raymond [name] Primary Care Cresencia Asmus: Raymond [name] Other Clinician: Referring Jaydalyn Demattia: Treating Amos Micheals/Extender: Gwendlyn Deutscher in Treatment: 0 Learning Preferences/Education Level/Primary Language Learning Preference: Explanation, Demonstration MARDELL, [name] ([id number]) [id number] Page 2 of 4 Highest Education Level: High School Preferred Language: Economist Language Barrier: No Translator Needed: No Memory Deficit: No Emotional Barrier: No Cultural/Religious Beliefs Affecting Medical Care: No Physical Barrier Impaired Vision: Yes Glasses Impaired Hearing: No Decreased Hand dexterity: No Knowledge/Comprehension Knowledge Level: High Comprehension Level: High Ability to understand written instructions: High Ability to understand verbal instructions: High Motivation Anxiety Level: Calm Cooperation: Cooperative Education Importance: Acknowledges Need Interest in Health Problems: Asks Questions Perception: Coherent Willingness to Engage in Self-Management High Activities: Readiness to Engage in Self-Management High Activities: Electronic Signature(s) Signed: 07/11/2022 11:39:18 AM By: Midge Aver MSN RN CNS WTA Entered By: Midge Aver on 07/08/2022 08:57:36 -------------------------------------------------------------------------------- Fall Risk Assessment Details Patient Name: Date of Service: Raymond [name], Raymond Niemann D. 07/08/2022 8:30 A M Medical Record Number: [id number] Patient Account Number: [id number] Date of Birth/Sex: Treating RN: Sep 26, 1965 (57 y.o. Raymond [name] Primary Care [place]: Raymond [name] Other Clinician: Referring Anwar Crill: Treating Verlisa Vara/Extender: Gwendlyn Deutscher in Treatment: 0 Fall Risk Assessment Items Have you had 2 or more falls in the last 12 monthso 0 No Have you had any fall that resulted in injury in the last 12 monthso 0 No FALLS RISK SCREEN History of falling - immediate or within 3 months 0 No Secondary diagnosis (Do you have 2 or more medical diagnoseso) 0 No Ambulatory aid None/bed rest/wheelchair/nurse 0 No Crutches/cane/walker 0 No Furniture 0 No Intravenous therapy Access/Saline/Heparin Lock 0 No Gait/Transferring Normal/ bed rest/ wheelchair 0 No Weak (short steps with or without shuffle, stooped but able to lift head while walking, may seek 0  No support from furniture) Impaired (short steps with shuffle, may have difficulty arising from chair, head down, impaired 0 No balance) Mental Status Oriented to own ability 0 Yes Electronic Signature(s) Signed: 07/11/2022 11:39:18 AM By: Midge Aver MSN RN CNS WTA Entered By: Midge Aver on 07/08/2022 08:58:07 Raymond [name] ([id number]) [id number] Page 3 of 4 -------------------------------------------------------------------------------- Foot Assessment Details Patient Name: Date of Service: Raymond [name]. 07/08/2022 8:30 A M Medical Record Number: [id number] Patient Account Number: [id number] Date of Birth/Sex: Treating RN: 01/26/1966 (57 y.o. Raymond [name] Primary Care Barbaraann Avans: Raymond [name] Other Clinician: Referring Shaivi Rothschild: Treating Raelea Gosse/Extender: Valerie Salts Weeks in Treatment: 0 Foot Assessment Items Site Locations + = Sensation present, - = Sensation absent, C = Callus, U = Ulcer R = Redness, W = Warmth, M = Maceration, PU = Pre-ulcerative lesion F = Fissure, S = Swelling, D = Dryness Assessment Right: Left: Other Deformity: No No Prior Foot Ulcer: Yes No Prior Amputation: No No Charcot Joint: No No Ambulatory Status: Ambulatory Without  Help Gait: Electronic Signature(s) Signed: 07/11/2022 11:39:18 AM By: Midge Aver MSN RN CNS WTA Entered By: Midge Aver on 07/08/2022 08:59:04 -------------------------------------------------------------------------------- Nutrition Risk Screening Details Patient Name: Date of Service: Raymond Blood D. 07/08/2022 8:30 A M Medical Record Number: [id number] Patient Account Number: [id number] Date of Birth/Sex: Treating RN: [birth year] (57 y.o. Raymond [name] Primary Care Reid Regas: Raymond [name] Other Clinician: Referring Rosealyn Little: Treating Takao Lizer/Extender: Valerie Salts Weeks in Treatment: 0 Height (in): 74 Weight (lbs): 398 Body Mass Index (BMI): 51.1 Nutrition Risk Screening Items Score Screening Raymond [name], SCHETTER ([id number]) [phone] [id number] Page 4 of 4 NUTRITION RISK SCREEN: I have an illness or condition that made me change the kind and/or amount of food I eat 0 No I eat fewer than two meals per day 0 No I eat few fruits and vegetables, or milk products 0 No I have three or more drinks of beer, liquor or wine almost every day 0 No I have tooth or mouth problems that make it hard for me to eat 0 No I don't always have enough money to buy the food I need 0 No I eat alone most of the time 0 No I take three or more different prescribed or over-the-counter drugs a day 1 Yes Without wanting to, I have lost or gained 10 pounds in the last six months 0 No I am not always physically able to shop, cook and/or feed myself 0 No Nutrition Protocols Good Risk Protocol 0 No interventions needed Moderate Risk Protocol High Risk Proctocol Risk Level: Good Risk Score: 1 Electronic Signature(s) Signed: 07/11/2022 11:39:18 AM By: Midge Aver MSN RN CNS WTA Entered By: Midge Aver on 07/08/2022 08:58:18

## 2022-07-13 NOTE — Progress Notes (Signed)
NUNCIO, RAHILL ([id number]) [id number] Page 1 of 10 Visit Report for 07/08/2022 Chief Complaint Document Details Patient Name: Date of Service: Raymond [name]. 07/08/2022 8:30 A M Medical Record Number: [id number] Patient Account Number: [id number] Date of Birth/Sex: Treating RN: 05-06-1965 (57 y.o. [name] Primary Care Provider: Martie Round Other Clinician: Referring Provider: Treating Provider/Extender: Valerie Salts Weeks in Treatment: 0 Information Obtained from: Patient Chief Complaint Left foot ulcer from surgical dehiscence following 2nd toe amputation right foot Electronic Signature(s) Signed: 07/08/2022 9:16:56 AM By: Allen Derry PA-C Entered By: Allen Derry on 07/08/2022 09:16:56 -------------------------------------------------------------------------------- Debridement Details Patient Name: Date of Service: Raymond Blood D. 07/08/2022 8:30 A M Medical Record Number: [id number] Patient Account Number: [id number] Date of Birth/Sex: Treating RN: May 03, 1965 (57 y.o. [name] Primary Care Provider: Martie Round Other Clinician: Referring Provider: Treating Provider/Extender: Valerie Salts Weeks in Treatment: 0 Debridement Performed for Assessment: Wound #4 Right Amputation Site - Toe Performed By: Physician Allen Derry, PA-C Debridement Type: Debridement Level of Consciousness (Pre-procedure): Awake and Alert Pre-procedure Verification/Time Out Yes - 09:25 Taken: Start Time: 09:25 Percent of Wound Bed Debrided: 100% T Area Debrided (cm): otal 1.65 Tissue and other material debrided: Viable, Non-Viable, Bone, Slough, Subcutaneous, Slough Level: Skin/Subcutaneous Tissue/Muscle/Bone Debridement Description: Excisional Instrument: Curette Specimen: Swab, Number of Specimens T aken: 2 Bleeding: Minimum Hemostasis Achieved: Pressure Procedural Pain: 0 Post Procedural Pain:  0 Response to Treatment: Procedure was tolerated well Level of Consciousness (Post- Awake and Alert procedure): Post Debridement Measurements of Total Wound Length: (cm) 3 Width: (cm) 0.7 Depth: (cm) 2.9 Volume: (cm) 4.783 Character of Wound/Ulcer Post Debridement: Stable Post Procedure Diagnosis Same as Pre-procedure Notes Tissue culture and bone pathology Electronic Signature(s) Signed: 07/08/2022 12:57:26 PM By: Aretta Nip, Weber Cooks ([id number]) [id number] Page 2 of 10 Signed: 07/11/2022 11:39:18 AM By: Midge Aver MSN RN CNS WTA Entered By: Midge Aver on 07/08/2022 09:30:46 -------------------------------------------------------------------------------- HPI Details Patient Name: Date of Service: Raymond [name], Raymond Niemann D. 07/08/2022 8:30 A M Medical Record Number: [id number] Patient Account Number: [id number] Date of Birth/Sex: Treating RN: 1965/04/13 (57 y.o. [name] Primary Care Provider: Martie Round Other Clinician: Referring Provider: Treating Provider/Extender: Valerie Salts Weeks in Treatment: 0 History of Present Illness HPI Description: 09/05/17-He is seeing an initial evaluation for a left plantar foot ulcer. He has a remote history of left great toe amputation. He states that 4-6 weeks ago he noted callus formation and ulceration. He has not seen primary care regarding this. He is not currently on antibiotic therapy. He does not routinely follow with podiatry. He states diabetic foot wear will arrive early next week. The EHR shows an A1c of 9% approximate 4 months ago but he states he had one and primary care a few weeks ago but does not know the results. He is neuropathic and does not complain of any pain, he is currently wearing crocs. 09/12/17-he is here in follow up evaluation for left plantar foot ulcer. There is improvement in both appearance and measurement. We will continue with same treatment  plan and he will follow up next week. 09/19/17 on evaluation today patient actually appears to be doing excellent in regard to his ulcer on the invitation site of his left great foot plantar aspect. He's been tolerating the dressing changes without complication. In fact with the Prisma and the current measures he has been shown signs of excellent improvement week by week up to  this point. We have been to breeding the wound in this seems to have been of great benefit for him. Fortunately there is no evidence of infection. 09/26/17 on evaluation today patient appears to be doing rather well in regard to his wound. He did not know quite as much improvement this week as compared to last week. Nonetheless he still continues to show signs of improving to some degree. I do believe he may benefit from an offloading shoe. No fevers, chills, nausea, or vomiting noted at this time. 10/03/17 on evaluation today patient actually appears to be doing much better in regard to the amputation site plantar foot ulcer. Overall this appears significantly smaller even compared to previous. He's been tolerating the dressing changes without complication. He did get his diabetic shoes and I did have a look at them they appear to be fairly good. Nonetheless I do think that for the time being I would probably recommend he continue with the offloading shoe that we have been utilizing just due to the fact that with the dressing I don't know that his diabetic shoes are going to work as appropriately as far as not called an additional pressure and irritation to the area in question. He understands. 10/10/17 on evaluation today patient actually appears to be doing very well in regard to his plantar foot ulcer. He has been tolerating the dressing changes without complication. I do feel like he's making signs of good improvement and in fact of the wound bed appears to be better although it may not be significantly changed in size it  appears healthier and I do believe is showing signs of improving. Nonetheless we gonna keep working towards healing as far as that is concerned. There is no evidence of infection. 10/17/17 on evaluation today patient actually appears to be doing poorly in regard to his plantar foot ulcer. Unfortunately other than just the area where the wound is itself there appears to be a blister that communicates with the wound unfortunately. This is more lateral to the wound itself and also to the distal point of the amputation site. There does not appear to be any evidence of cellulitis spreading at the foot but I do believe some of the drainage from the site itself is. When in nature. Nonetheless this blister area I think needs to be removed in order to allow for appropriate healing of the ulcer itself I think that is gonna be difficult for it to heal otherwise. This was discussed with the patient today. 10/24/17 on evaluation today patient actually appears to be doing better compared to last week even post debridement. He has shown signs of improvement he did test positive for Staphylococcus aureus. With that being said he notes that he's not have any discomfort and in general he does feel like things seem to been doing better. Fortunately there's no additional blistering and no evidence of remaining infection at this time. No fevers, chills, nausea, or vomiting noted at this time. He has three days of antibiotic left. 10/31/17 on evaluation today patient actually appears to be doing very well in regard to his ulcer on the left foot. He has been tolerating the dressing changes without complication. With that being said fortunately there appears to be no evidence of infection I do think he's made progress compared to previous. No fevers, chills, nausea, or vomiting noted at this time. 11/14/17 on evaluation today patient actually appears to be doing excellent in regard to his amputation site ulcer on his foot.  In  fact this appears to be completely healed at this point. There is no evidence of infection nor underline abscess and I did thoroughly evaluate the periwound location. Overall I'm very pleased with how things appear. Readmission: 01-03-2022 upon evaluation today patient appears for reevaluation here in the clinic although it has been since 2019 that I last saw him. This again has been over 4 years. With that being said he is having an issue with a wound on his foot unfortunately. He has not had any recent x-rays he tells me at this point which is unfortunate as avoid definitely can need to get something started that can be one of the primary items to get moving forward with. With that being said I also think were probably can obtain a wound culture he is probably going to require some antibiotics at some point. I just want to make sure that we have him on the right thing when we do this. Currently the patient tells me that his wounds have been present in regard to the met head for about a month at this point. With that being said he also has a wound on the third toe which is the next toe this actually still present. This unfortunately has a lot of callus buildup around it but I think it is larger underneath than what it would appear on initial inspection. Patient does have a history of diabetes mellitus type 2, congestive heart failure, and COPD. 01-11-2022 upon evaluation patient's wounds actually are showing signs of doing about the same may be just slightly cleaner but definitely not where we want things to be as of yet. Fortunately there does not appear to be any signs of active infection locally nor systemically at this point which is great news. No fevers, chills, nausea, vomiting, or diarrhea. Of note patient has had his MRI but we do not have the results of that as of yet we are still waiting for the reading on this at this point. 01-18-2022 upon evaluation today patient presents after having  had his MRI finally complete. 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 The third and fourth digits seem to be early which is good news. Nonetheless we are still going to need to try to see about getting the infection under control he is already been on the antibiotic therapy which I think has done quite well. In fact I feel like he is showing signs of improvement already. SAMIE, SEVER ([id number]) [id number] Page 3 of 10 12/28; patient with a wound on the medial aspect of the left first metatarsal head and an area on the left third toe with slight hammer deformity. He has had a previous left first toe amputation. We are using Iodoflex on the wound. He is a diabetic a recent MRI showed osteomyelitis we have been giving him Cipro and Doxy which he appears to be tolerating well. 02-10-2022 upon evaluation today patient unfortunately has issues still with open wounds of his foot on the left. He does have known osteomyelitis at this point and that is something that we have been treating with oral antibiotics. I actually have not seen him personally since 19 December. In that time he has not had any debridement from that point until today based on what I can see as best I can tell anyway. Has been on Cipro as well as doxycycline. Nonetheless he does still seem to have open wounds today  and I think that he would benefit from hyperbaric oxygen therapy. I discussed that with him today as well. 02-18-2022 upon evaluation today patient appears to be doing about the same in regard to his wounds. Fortunately I do not see any signs of infection although he still has areas that seem to initially close down but that he has a lot of callus that still open at both locations most on the metatarsal region as well as the toe. I think that he is appropriate for proceeding with the hyperbarics and I think the sooner we  get this done to try to get these wounds healed the better off he will be. 02-25-2022 upon evaluation today patient's wounds do show signs of being dry get again. We were attempting to pack and some of the alginate dressing instead of doing the collagen as everything was getting very dry but nonetheless this is still continue to be an ongoing issue. My suggestion based on what I am seeing is good to be that we actually switch to doing Xeroform which should hopefully keep this a little bit more moist and from hopefully closing up prematurely. The patient voiced understanding. 03-03-2022 upon evaluation today patient appears to be doing well with regard to his wounds things as before appear to be getting dry. We are using Xeroform to try to keep it open is much as possible but each time I see him he does tend to cover over the good news is each time he also seems to be getting a little bit smaller which is excellent. Fortunately I do not see any evidence of active infection locally nor systemically which is great news. I think this means that between the antibiotics and the hyperbarics were really on a good track here. 03-10-2022 upon evaluation today patient appears to be doing well currently in regard to his wounds. He is actually showing signs of some good improvement he is also tolerating HBO therapy very well. I do not see any evidence of active infection locally nor systemically which is great news and overall I am extremely pleased in that regard. No fevers, chills, nausea, vomiting, or diarrhea. 03-17-2022 upon evaluation today patient appears to be doing well currently in regard to his wounds in fact the toe is completely healed. The metatarsal head location though not completely healed appears to be doing much better which is great news and overall I am extremely pleased with where we stand today. 03-24-2022 upon evaluation today patient appears to be doing well currently in regard to his wounds. He  has been tolerating the dressing changes without complication. Fortunately there does not appear to be any signs of active infection locally nor systemically in fact I am not so sure this wound is very close to being healed. I did perform some debridement to clearway some of the callus and the patient tolerated this today without complication. Postdebridement the wound bed is significantly improved. 03-31-2022 upon evaluation today patient appears to be doing well currently in regard to his wound which is actually showing signs of being completely healed. This is great news. Fortunately I do believe that the patient is doing much better he still on the oral antibiotics which I think is helping to treat the osteomyelitis is also undergoing hyperbaric oxygen therapy which I think is doing a really good job here as well. 04-21-2022 upon evaluation today patient continues to remain completely closed. He seems to be doing excellent and is very close to completion of his 32  treatments of HBO therapy which that is the point where recommend discontinue therapy at that point. Fortunately I do not see any evidence of active infection locally nor systemically at this time which is great news. Readmission: 07-08-2022 unfortunately Mr. Emert since I last saw him ended up having an issue with osteomyelitis of his great toe right side which included having to have amputation. This amputation was performed actually on 06-08-2022 and was again amputated secondary to the osteomyelitis. With that being said following however this has dehisced and therefore the patient was referred to [place] for further evaluation and treatment of the dehisced surgical wound at the amputation site of the right great toe. His past medical history really has not changed significantly since I last saw him in the first of the year we will be doing hyperbarics for the left foot we are able to get all this healed that still is doing well  according to what the patient tells me at this point. Electronic Signature(s) Signed: 07/08/2022 10:31:10 AM By: Allen Derry PA-C Entered By: Allen Derry on 07/08/2022 10:31:10 -------------------------------------------------------------------------------- Physical Exam Details Patient Name: Date of Service: Raymond Blood D. 07/08/2022 8:30 A M Medical Record Number: [id number] Patient Account Number: [id number] Date of Birth/Sex: Treating RN: Feb 27, 1965 (57 y.o. [name] Primary Care Provider: Martie Round Other Clinician: Referring Provider: Treating Provider/Extender: Valerie Salts Weeks in Treatment: 0 Constitutional patient is hypertensive.. pulse regular and within target range for patient.Marland Kitchen respirations regular, non-labored and within target range for patient.Marland Kitchen temperature within target range for patient.. Well-nourished and well-hydrated in no acute distress. Eyes conjunctiva clear no eyelid edema noted. pupils equal round and reactive to light and accommodation. Ears, Nose, Mouth, and Throat no gross abnormality of ear auricles or external auditory canals. normal hearing noted during conversation. mucus membranes moist. Respiratory normal breathing without difficulty. MATHAIS, RUNKLES ([id number]) [id number] Page 4 of 10 Cardiovascular 2+ dorsalis pedis/posterior tibialis pulses. no clubbing, cyanosis, significant edema, <3 sec cap refill. Musculoskeletal normal gait and posture. no significant deformity or arthritic changes, no loss or range of motion, no clubbing. Psychiatric this patient is able to make decisions and demonstrates good insight into disease process. Alert and Oriented x 3. pleasant and cooperative. Notes Upon inspection patient's wound actually has a lot of necrotic tissue noted at this point in regard to his right great toe amputation site. I did actually have to perform debridement clearway  some of the necrotic debris and I did obtain a PCR culture postdebridement as well as removing some of the necrotic bone I was able to get some of this to send in for pathology. Electronic Signature(s) Signed: 07/08/2022 10:33:46 AM By: Allen Derry PA-C Entered By: Allen Derry on 07/08/2022 10:33:46 -------------------------------------------------------------------------------- Physician Orders Details Patient Name: Date of Service: Raymond Blood D. 07/08/2022 8:30 A M Medical Record Number: [id number] Patient Account Number: [id number] Date of Birth/Sex: Treating RN: [birth year] (57 y.o. 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 Bathing/ Shower/ Hygiene May  shower; gently cleanse wound with antibacterial soap, rinse and pat dry prior to dressing wounds Off-Loading Open toe surgical shoe Wound Treatment Wound #4 - Amputation Site - Toe Wound Laterality: Right Cleanser: Soap and Water 3 x Per Week/30 Days Discharge Instructions: Gently cleanse wound with antibacterial soap, rinse and pat dry prior to dressing wounds Prim Dressing: Silvercel Small 2x2 (in/in) (DME) (Dispense As Written) 3 x Per Week/30 Days ary Discharge Instructions: Apply Silvercel Small 2x2 (in/in) as instructed Secondary Dressing: Gauze (DME) (Generic) 3 x Per Week/30  Days Discharge Instructions: As directed: dry, moistened with saline or moistened with Dakins Solution Secured With: Medipore T - 62M Medipore H Soft Cloth Surgical T ape ape, 2x2 (in/yd) (DME) (Generic) 3 x Per Week/30 Days Secured With: Kerlix Roll Sterile or Non-Sterile 6-ply 4.5x4 (yd/yd) (DME) (Generic) 3 x Per Week/30 Days Discharge Instructions: Apply Kerlix as directed Laboratory Bacteria identified in Wound by Culture (MICRO) - Right 2nd toe amputation site LOINC Code: 6462-6 Convenience Name: Wound culture routine DEVARUS, HUBEL ([id number]) [id number] Page 5 of 10 Bacteria identified in Tissue by Biopsy culture (MICRO) - Bone LOINC Code: [phone] Convenience Name: Biopsy specimen culture Patient Medications llergies: No Known Allergies A Notifications Medication Indication Start End 07/08/2022 doxycycline hyclate DOSE 1 - oral 100 mg capsule - 1 capsule oral twice a day x 30 days Cipro DOSE 1 - oral 500 mg tablet - 1 tablet oral twice a day x 30 days Electronic Signature(s) Signed: 07/11/2022 11:39:18 AM By: Midge Aver MSN RN CNS WTA Signed: 07/12/2022 6:26:43 PM By: Allen Derry PA-C Previous Signature: 07/08/2022 10:35:41 AM Version By: Allen Derry PA-C Entered By: Midge Aver on 07/08/2022 13:19:45 -------------------------------------------------------------------------------- Problem List Details Patient Name: Date of Service: Raymond Blood D. 07/08/2022 8:30 A M Medical Record Number: [id number] Patient Account Number: [id number] Date of Birth/Sex: Treating RN: Jun 19, 1965 (57 y.o. [name] Primary Care Provider: Martie Round Other Clinician: Referring Provider: Treating Provider/Extender: Valerie Salts Weeks in Treatment: 0 Active Problems ICD-10 Encounter Code Description Active Date MDM Diagnosis T81.31XA Disruption of external operation (surgical) wound, not elsewhere classified, 07/08/2022 No  Yes initial encounter L97.512 Non-pressure chronic ulcer of other part of right foot with fat layer exposed 07/08/2022 No Yes E11.621 Type 2 diabetes mellitus with foot ulcer 07/08/2022 No Yes I50.42 Chronic combined systolic (congestive) and diastolic (congestive) heart failure 07/08/2022 No Yes J44.9 Chronic obstructive pulmonary disease, unspecified 07/08/2022 No Yes Inactive Problems Resolved Problems Electronic Signature(s) Signed: 07/08/2022 9:16:10 AM By: Allen Derry PA-C Entered By: Allen Derry on 07/08/2022 09:16:09 Lucia Gaskins ([id number]) [id number] Page 6 of 10 -------------------------------------------------------------------------------- Progress Note Details Patient Name: Date of Service: Raymond [name]. 07/08/2022 8:30 A M Medical Record Number: [id number] Patient Account Number: [id number] Date of Birth/Sex: Treating RN: July 30, 1965 (57 y.o. [name] Primary Care Provider: Martie Round Other Clinician: Referring Provider: Treating Provider/Extender: Valerie Salts Weeks in Treatment: 0 Subjective Chief Complaint Information obtained from Patient Left foot ulcer from surgical dehiscence following 2nd toe amputation right foot History of Present Illness (HPI) 09/05/17-He is seeing an initial evaluation for a left plantar foot ulcer. He has a remote history of left great toe amputation. He states that 4-6 weeks ago he noted callus formation and ulceration. He has not seen primary care regarding this. He is not currently on antibiotic therapy. He does not routinely follow with podiatry. He states diabetic foot wear will arrive early next week. The EHR shows  an A1c of 9% approximate 4 months ago but he states he had one and primary care a few weeks ago but does not know the results. He is neuropathic and does not complain of any pain, he is currently wearing crocs. 09/12/17-he is here in follow up evaluation for left  plantar foot ulcer. There is improvement in both appearance and measurement. We will continue with same treatment plan and he will follow up next week. 09/19/17 on evaluation today patient actually appears to be doing excellent in regard to his ulcer on the invitation site of his left great foot plantar aspect. He's been tolerating the dressing changes without complication. In fact with the Prisma and the current measures he has been shown signs of excellent improvement week by week up to this point. We have been to breeding the wound in this seems to have been of great benefit for him. Fortunately there is no evidence of infection. 09/26/17 on evaluation today patient appears to be doing rather well in regard to his wound. He did not know quite as much improvement this week as compared to last week. Nonetheless he still continues to show signs of improving to some degree. I do believe he may benefit from an offloading shoe. No fevers, chills, nausea, or vomiting noted at this time. 10/03/17 on evaluation today patient actually appears to be doing much better in regard to the amputation site plantar foot ulcer. Overall this appears significantly smaller even compared to previous. He's been tolerating the dressing changes without complication. He did get his diabetic shoes and I did have a look at them they appear to be fairly good. Nonetheless I do think that for the time being I would probably recommend he continue with the offloading shoe that we have been utilizing just due to the fact that with the dressing I don't know that his diabetic shoes are going to work as appropriately as far as not called an additional pressure and irritation to the area in question. He understands. 10/10/17 on evaluation today patient actually appears to be doing very well in regard to his plantar foot ulcer. He has been tolerating the dressing changes without complication. I do feel like he's making signs of good  improvement and in fact of the wound bed appears to be better although it may not be significantly changed in size it appears healthier and I do believe is showing signs of improving. Nonetheless we gonna keep working towards healing as far as that is concerned. There is no evidence of infection. 10/17/17 on evaluation today patient actually appears to be doing poorly in regard to his plantar foot ulcer. Unfortunately other than just the area where the wound is itself there appears to be a blister that communicates with the wound unfortunately. This is more lateral to the wound itself and also to the distal point of the amputation site. There does not appear to be any evidence of cellulitis spreading at the foot but I do believe some of the drainage from the site itself is. When in nature. Nonetheless this blister area I think needs to be removed in order to allow for appropriate healing of the ulcer itself I think that is gonna be difficult for it to heal otherwise. This was discussed with the patient today. 10/24/17 on evaluation today patient actually appears to be doing better compared to last week even post debridement. He has shown signs of improvement he did test positive for Staphylococcus aureus. With that being said he  notes that he's not have any discomfort and in general he does feel like things seem to been doing better. Fortunately there's no additional blistering and no evidence of remaining infection at this time. No fevers, chills, nausea, or vomiting noted at this time. He has three days of antibiotic left. 10/31/17 on evaluation today patient actually appears to be doing very well in regard to his ulcer on the left foot. He has been tolerating the dressing changes without complication. With that being said fortunately there appears to be no evidence of infection I do think he's made progress compared to previous. No fevers, chills, nausea, or vomiting noted at this time. 11/14/17 on  evaluation today patient actually appears to be doing excellent in regard to his amputation site ulcer on his foot. In fact this appears to be completely healed at this point. There is no evidence of infection nor underline abscess and I did thoroughly evaluate the periwound location. Overall I'm very pleased with how things appear. Readmission: 01-03-2022 upon evaluation today patient appears for reevaluation here in the clinic although it has been since 2019 that I last saw him. This again has been over 4 years. With that being said he is having an issue with a wound on his foot unfortunately. He has not had any recent x-rays he tells me at this point which is unfortunate as avoid definitely can need to get something started that can be one of the primary items to get moving forward with. With that being said I also think were probably can obtain a wound culture he is probably going to require some antibiotics at some point. I just want to make sure that we have him on the right thing when we do this. Currently the patient tells me that his wounds have been present in regard to the met head for about a month at this point. With that being said he also has a wound on the third toe which is the next toe this actually still present. This unfortunately has a lot of callus buildup around it but I think it is larger underneath than what it would appear on initial inspection. Patient does have a history of diabetes mellitus type 2, congestive heart failure, and COPD. 01-11-2022 upon evaluation patient's wounds actually are showing signs of doing about the same may be just slightly cleaner but definitely not where we want things to be as of yet. Fortunately there does not appear to be any signs of active infection locally nor systemically at this point which is great news. No fevers, chills, nausea, vomiting, or diarrhea. Of note patient has had his MRI but we do not have the results of that as of yet we are  still waiting for the reading on this at this point. 01-18-2022 upon evaluation today patient presents after having had his MRI finally complete. 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 The third and fourth digits seem to be early which is good news. Nonetheless we are still going to need to try to see about getting the infection under control he is already been on the antibiotic therapy which I think has done quite well. In fact I feel like he is showing signs of improvement already. 12/28; patient with a wound on the medial aspect of the left first metatarsal head and an area on the left third toe with slight hammer deformity. He has had a previous left first  toe amputation. We are using Iodoflex on the wound. He is a diabetic a recent MRI showed osteomyelitis we have been giving him Cipro and Doxy which he appears to be tolerating well. SHELVIN, DRESS ([id number]) [id number] Page 7 of 10 02-10-2022 upon evaluation today patient unfortunately has issues still with open wounds of his foot on the left. He does have known osteomyelitis at this point and that is something that we have been treating with oral antibiotics. I actually have not seen him personally since 19 December. In that time he has not had any debridement from that point until today based on what I can see as best I can tell anyway. Has been on Cipro as well as doxycycline. Nonetheless he does still seem to have open wounds today and I think that he would benefit from hyperbaric oxygen therapy. I discussed that with him today as well. 02-18-2022 upon evaluation today patient appears to be doing about the same in regard to his wounds. Fortunately I do not see any signs of infection although he still has areas that seem to initially close down but that he has a lot of callus that still open at both locations most on the metatarsal  region as well as the toe. I think that he is appropriate for proceeding with the hyperbarics and I think the sooner we get this done to try to get these wounds healed the better off he will be. 02-25-2022 upon evaluation today patient's wounds do show signs of being dry get again. We were attempting to pack and some of the alginate dressing instead of doing the collagen as everything was getting very dry but nonetheless this is still continue to be an ongoing issue. My suggestion based on what I am seeing is good to be that we actually switch to doing Xeroform which should hopefully keep this a little bit more moist and from hopefully closing up prematurely. The patient voiced understanding. 03-03-2022 upon evaluation today patient appears to be doing well with regard to his wounds things as before appear to be getting dry. We are using Xeroform to try to keep it open is much as possible but each time I see him he does tend to cover over the good news is each time he also seems to be getting a little bit smaller which is excellent. Fortunately I do not see any evidence of active infection locally nor systemically which is great news. I think this means that between the antibiotics and the hyperbarics were really on a good track here. 03-10-2022 upon evaluation today patient appears to be doing well currently in regard to his wounds. He is actually showing signs of some good improvement he is also tolerating HBO therapy very well. I do not see any evidence of active infection locally nor systemically which is great news and overall I am extremely pleased in that regard. No fevers, chills, nausea, vomiting, or diarrhea. 03-17-2022 upon evaluation today patient appears to be doing well currently in regard to his wounds in fact the toe is completely healed. The metatarsal head location though not completely healed appears to be doing much better which is great news and overall I am extremely pleased with where  we stand today. 03-24-2022 upon evaluation today patient appears to be doing well currently in regard to his wounds. He has been tolerating the dressing changes without complication. Fortunately there does not appear to be any signs of active infection locally nor systemically in fact I am not  so sure this wound is very close to being healed. I did perform some debridement to clearway some of the callus and the patient tolerated this today without complication. Postdebridement the wound bed is significantly improved. 03-31-2022 upon evaluation today patient appears to be doing well currently in regard to his wound which is actually showing signs of being completely healed. This is great news. Fortunately I do believe that the patient is doing much better he still on the oral antibiotics which I think is helping to treat the osteomyelitis is also undergoing hyperbaric oxygen therapy which I think is doing a really good job here as well. 04-21-2022 upon evaluation today patient continues to remain completely closed. He seems to be doing excellent and is very close to completion of his 40 treatments of HBO therapy which that is the point where recommend discontinue therapy at that point. Fortunately I do not see any evidence of active infection locally nor systemically at this time which is great news. Readmission: 07-08-2022 unfortunately Mr. Shamburg since I last saw him ended up having an issue with osteomyelitis of his great toe right side which included having to have amputation. This amputation was performed actually on 06-08-2022 and was again amputated secondary to the osteomyelitis. With that being said following however this has dehisced and therefore the patient was referred to [place] for further evaluation and treatment of the dehisced surgical wound at the amputation site of the right great toe. His past medical history really has not changed significantly since I last saw him in the first of the  year we will be doing hyperbarics for the left foot we are able to get all this healed that still is doing well according to what the patient tells me at this point. Patient History Information obtained from Patient, Chart. Allergies No Known Allergies Family History Diabetes - Mother,Maternal Grandparents, Hypertension - Father, Stroke - Father, No family history of Cancer, Heart Disease, Hereditary Spherocytosis, Kidney Disease, Lung Disease, Seizures, Thyroid Problems, Tuberculosis. Social History Never smoker, Marital Status - Divorced, Alcohol Use - Rarely, Drug Use - No History, Caffeine Use - Never. Medical History Cardiovascular Patient has history of Congestive Heart Failure, Hypertension Endocrine Patient has history of Type II Diabetes Denies history of Type I Diabetes Neurologic Patient has history of Neuropathy - Feet Objective Constitutional patient is hypertensive.. pulse regular and within target range for patient.Marland Kitchen respirations regular, non-labored and within target range for patient.Marland Kitchen temperature within target range for patient.. Well-nourished and well-hydrated in no acute distress. Raymond, [name] ([id number]) [id number] Page 8 of 10 Vitals Time Taken: 8:38 AM, Height: 74 in, Source: Stated, Weight: 398 lbs, Source: Stated, BMI: 51.1, Temperature: 98.4 F, Pulse: 86 bpm, Respiratory Rate: 16 breaths/min, Blood Pressure: 160/84 mmHg. Eyes conjunctiva clear no eyelid edema noted. pupils equal round and reactive to light and accommodation. Ears, Nose, Mouth, and Throat no gross abnormality of ear auricles or external auditory canals. normal hearing noted during conversation. mucus membranes moist. Respiratory normal breathing without difficulty. Cardiovascular 2+ dorsalis pedis/posterior tibialis pulses. no clubbing, cyanosis, significant edema, Musculoskeletal normal gait and posture. no significant deformity or arthritic changes,  no loss or range of motion, no clubbing. Psychiatric this patient is able to make decisions and demonstrates good insight into disease process. Alert and Oriented x 3. pleasant and cooperative. General Notes: Upon inspection patient's wound actually has a lot of necrotic tissue noted at this point in regard to his right great toe amputation site. I did actually have  to perform debridement clearway some of the necrotic debris and I did obtain a PCR culture postdebridement as well as removing some of the necrotic bone I was able to get some of this to send in for pathology. Integumentary (Hair, Skin) Wound #4 status is Open. Original cause of wound was Surgical Injury. The date acquired was: 06/09/2022. The wound is located on the Right Amputation Site - T The wound measures 3cm length x 0.7cm width x 2.7cm depth; 1.649cm^2 area and 4.453cm^3 volume. There is Fat Layer (Subcutaneous Tissue) oe. exposed. There is no tunneling or undermining noted. There is a medium amount of serosanguineous drainage noted. There is small (1-33%) red, pink granulation within the wound bed. There is a small (1-33%) amount of necrotic tissue within the wound bed. Assessment Active Problems ICD-10 Disruption of external operation (surgical) wound, not elsewhere classified, initial encounter Non-pressure chronic ulcer of other part of right foot with fat layer exposed Type 2 diabetes mellitus with foot ulcer Chronic combined systolic (congestive) and diastolic (congestive) heart failure Chronic obstructive pulmonary disease, unspecified Procedures Wound #4 Pre-procedure diagnosis of Wound #4 is an Open Surgical Wound located on the Right Amputation Site - T . There was a Excisional Skin/Subcutaneous oe Tissue/Muscle/Bone Debridement with a total area of 1.65 sq cm performed by Allen Derry, PA-C. With the following instrument(s): Curette to remove Viable and Non-Viable tissue/material. Material removed includes  Bone,Subcutaneous Tissue, and Slough. 2 specimens were taken by a Swab and sent to the lab per facility protocol. A time out was conducted at 09:25, prior to the start of the procedure. A Minimum amount of bleeding was controlled with Pressure. The procedure was tolerated well with a pain level of 0 throughout and a pain level of 0 following the procedure. Post Debridement Measurements: 3cm length x 0.7cm width x 2.9cm depth; 4.783cm^3 volume. Character of Wound/Ulcer Post Debridement is stable. Post procedure Diagnosis Wound #4: Same as Pre-Procedure General Notes: Tissue culture and bone pathology. Plan Follow-up Appointments: Return Appointment in 1 week. Bathing/ Shower/ Hygiene: May shower; gently cleanse wound with antibacterial soap, rinse and pat dry prior to dressing wounds Off-Loading: Open toe surgical shoe The following medication(s) was prescribed: doxycycline hyclate oral 100 mg capsule 1 1 capsule oral twice a day x 30 days starting 07/08/2022 Cipro oral 500 mg tablet 1 1 tablet oral twice a day x 30 days WOUND #4: - Amputation Site - T oe Wound Laterality: Right Cleanser: Soap and Water 3 x Per Week/30 Days Discharge Instructions: Gently cleanse wound with antibacterial soap, rinse and pat dry prior to dressing wounds Prim Dressing: Silvercel Small 2x2 (in/in) (DME) (Dispense As Written) 3 x Per Week/30 Days ary Discharge Instructions: Apply Silvercel Small 2x2 (in/in) as instructed Secondary Dressing: Gauze (DME) (Generic) 3 x Per Week/30 Days Discharge Instructions: As directed: dry, moistened with saline or moistened with Dakins Solution Secured With: Medipore T - 23M Medipore H Soft Cloth Surgical T ape ape, 2x2 (in/yd) (DME) (Generic) 3 x Per Week/30 Days Secured With: [employer] Sterile or Non-Sterile 6-ply 4.5x4 (yd/yd) (DME) (Generic) 3 x Per Week/30 Days JODE, LAY ([id number]) [id number] Page 9 of 10 Discharge Instructions:  Apply Kerlix as directed 1. Based on what I am seeing I do believe that the patient would benefit from going ahead and initiating treatment with antibiotic therapy. He has previously done well with a combination of the Cipro and doxycycline which I did for him previous we are treating the osteomyelitis on the left  foot I think that is probably be good for [place] to do at this point. 2. I am going to go ahead and obtain a PCR culture I did send this in to be evaluated further today and we will see what the results of that show but either way I think that we need to going get him started on some antibiotics here. He is in agreement with that plan. 3. I am also going to suggest that the patient should go ahead and use silver alginate to pack into the area at this point. He is in agreement with the plan and we will subsequently see where things stand at follow-up. My hope is that with the antibiotics above initiated he will be doing significantly better by that time even next week but again if things do not seem to be improving as we expect and hope they should the neck step would be to consider the possibility of HBO therapy for the right great toe amputation site to try to help this area to heal more effectively and quickly. The patient is in agreement with the plan. We will see patient back for reevaluation in 1 week here in the clinic. If anything worsens or changes patient will contact our office for additional recommendations. Electronic Signature(s) Signed: 07/08/2022 10:36:30 AM By: Allen Derry PA-C Entered By: Allen Derry on 07/08/2022 10:36:29 -------------------------------------------------------------------------------- ROS/PFSH Details Patient Name: Date of Service: Raymond [name], Raymond Niemann D. 07/08/2022 8:30 A M Medical Record Number: [id number] Patient Account Number: [id number] Date of Birth/Sex: Treating RN: 1965/04/02 (57 y.o. [name] Primary Care Provider: Martie Round Other  Clinician: Referring Provider: Treating Provider/Extender: Gwendlyn Deutscher in Treatment: 0 Information Obtained From Patient Chart Cardiovascular Medical History: Positive for: Congestive Heart Failure; Hypertension Endocrine Medical History: Positive for: Type II Diabetes Negative for: Type I Diabetes Time with diabetes: 15 years Treated with: Insulin, Oral agents Blood sugar tested every day: Yes Tested : PRN Neurologic Medical History: Positive for: Neuropathy - Feet Immunizations Pneumococcal Vaccine: Received Pneumococcal Vaccination: No Tetanus Vaccine: Last tetanus shot: 09/05/2016 Implantable Devices No devices added Family and Social History Cancer: No; Diabetes: Yes - Mother,Maternal Grandparents; Heart Disease: No; Hereditary Spherocytosis: No; Hypertension: Yes - Father; Kidney Disease: No; Lung Disease: No; Seizures: No; Stroke: Yes - Father; Thyroid Problems: No; Tuberculosis: No; Never smoker; Marital Status - Divorced; Alcohol Use: Rarely; Drug Use: No History; Caffeine Use: Never; Financial Concerns: No; Food, Clothing or Shelter Needs: No; Support System Lacking: No; Transportation Concerns: No Electronic Signature(s) Signed: 07/08/2022 12:57:26 PM By: Allen Derry PA-C Signed: 07/11/2022 11:39:18 AM By: Midge Aver MSN RN CNS WTA Entered By: Midge Aver on 07/08/2022 08:56:34 Lucia Gaskins ([id number]) [id number] Page 10 of 10 -------------------------------------------------------------------------------- SuperBill Details Patient Name: Date of Service: Raymond [name] 07/08/2022 Medical Record Number: [id number] Patient Account Number: [id number] Date of Birth/Sex: Treating RN: 1965-04-19 (57 y.o. [name] Primary Care Provider: Martie Round Other Clinician: Referring Provider: Treating Provider/Extender: Valerie Salts Weeks in Treatment: 0 Diagnosis Coding ICD-10  Codes Code Description T81.31XA Disruption of external operation (surgical) wound, not elsewhere classified, initial encounter L97.512 Non-pressure chronic ulcer of other part of right foot with fat layer exposed E11.621 Type 2 diabetes mellitus with foot ulcer I50.42 Chronic combined systolic (congestive) and diastolic (congestive) heart failure J44.9 Chronic obstructive pulmonary disease, unspecified Facility Procedures : CPT4 Code: 93235573 Description: 99213 - WOUND CARE VISIT-LEV 3 EST PT Modifier: Quantity: 1 : CPT4 Code: 22025427  Description: 11044 - DEB BONE 20 SQ CM/< ICD-10 Diagnosis Description L97.512 Non-pressure chronic ulcer of other part of right foot with fat layer exposed Modifier: Quantity: 1 Physician Procedures : CPT4: Description Modifier Code [id number] 99214 - WC PHYS LEVEL 4 - EST PT 25 ICD-10 Diagnosis Description T81.31XA Disruption of external operation (surgical) wound, not elsewhere classified, initial encounter L97.512 Non-pressure chronic ulcer of other  part of right foot with fat layer exposed E11.621 Type 2 diabetes mellitus with foot ulcer I50.42 Chronic combined systolic (congestive) and diastolic (congestive) heart failure Quantity: 1 : CPT4: 4540981 Debridement; bone (includes epidermis, dermis, subQ tissue, muscle and/or fascia, if performed) 1st 20 sqcm or less ICD-10 Diagnosis Description L97.512 Non-pressure chronic ulcer of other part of right foot with fat layer exposed Quantity: 1 Electronic Signature(s) Signed: 07/08/2022 1:30:03 PM By: Midge Aver MSN RN CNS WTA Signed: 07/12/2022 6:26:43 PM By: Allen Derry PA-C Previous Signature: 07/08/2022 10:36:53 AM Version By: Allen Derry PA-C Entered By: Midge Aver on 07/08/2022 13:30:03

## 2022-07-15 ENCOUNTER — Encounter: Payer: Medicare HMO | Admitting: Physician Assistant

## 2022-07-15 DIAGNOSIS — E11621 Type 2 diabetes mellitus with foot ulcer: Secondary | ICD-10-CM | POA: Diagnosis not present

## 2022-07-16 NOTE — Progress Notes (Signed)
MANA, REUTER ([id number]) [id number] Page 1 of 10 Visit Report for 07/15/2022 Chief Complaint Document Details Patient Name: Date of Service: Raymond [name]. 07/15/2022 11:00 A M Medical Record Number: [id number] Patient Account Number: [id number] Date of Birth/Sex: Treating RN: 28-Sep-1965 (57 y.o. Raymond [name] Other Clinician: Referring Provider: Treating Provider/Extender: Valerie Salts Weeks in Treatment: 1 Information Obtained from: Patient Chief Complaint Left foot ulcer from surgical dehiscence following 2nd toe amputation right foot Electronic Signature(s) Signed: 07/15/2022 11:31:11 AM By: Allen Derry PA-C Entered By: Allen Derry on 07/15/2022 11:31:11 -------------------------------------------------------------------------------- Debridement Details Patient Name: Date of Service: Raymond Blood D. 07/15/2022 11:00 A M Medical Record Number: [id number] Patient Account Number: [id number] Date of Birth/Sex: Treating RN: Oct 03, 1965 (57 y.o. Raymond [name] Other Clinician: Referring Provider: Treating Provider/Extender: Valerie Salts Weeks in Treatment: 1 Debridement Performed for Assessment: Wound #4 Right Amputation Site - Toe Performed By: Physician Allen Derry, PA-C Debridement Type: Debridement Level of Consciousness (Pre-procedure): Awake and Alert Pre-procedure Verification/Time Out Yes - 11:36 Taken: Start Time: 11:36 Percent of Wound Bed Debrided: 100% T Area Debrided (cm): otal 1.43 Tissue and other material debrided: Viable, Non-Viable, Slough, Subcutaneous, Slough Level: Skin/Subcutaneous Tissue Debridement Description: Excisional Instrument: Curette Bleeding: Minimum Hemostasis Achieved: Pressure Procedural Pain: 0 Post Procedural Pain: 0 Response to Treatment: Procedure was tolerated  well Level of Consciousness Raymond SolomonsTELVIN, Raymond [name] ([id number]) [id number] Page 2 of 10 Level of Consciousness (Post- Awake and Alert procedure): Post Debridement Measurements of Total Wound Length: (cm) 2.6 Width: (cm) 0.7 Depth: (cm) 3.4 Volume: (cm) 4.86 Character of Wound/Ulcer Post Debridement: Stable Post Procedure Diagnosis Same as Pre-procedure Electronic Signature(s) Signed: 07/15/2022 1:02:01 PM By: Midge Aver MSN RN CNS WTA Signed: 07/15/2022 1:44:39 PM By: Allen Derry PA-C Entered By: Midge Aver on 07/15/2022 11:37:30 -------------------------------------------------------------------------------- HPI Details Patient Name: Date of Service: Raymond [name], Raymond Niemann D. 07/15/2022 11:00 A M Medical Record Number: [id number] Patient Account Number: [id number] Date of Birth/Sex: Treating RN: November 23, 1965 (57 y.o. Raymond [name] Other Clinician: Referring Provider: Treating Provider/Extender: Valerie Salts Weeks in Treatment: 1 History of Present Illness HPI Description: 09/05/17-He is seeing an initial evaluation for a left plantar foot ulcer. He has a remote history of left great toe amputation. He states that 4-6 weeks ago he noted callus formation and ulceration. He has not seen primary care regarding this. He is not currently on antibiotic therapy. He does not routinely follow with podiatry. He states diabetic foot wear will arrive early next week. The EHR shows an A1c of 9% approximate 4 months ago but he states he had one and primary care a few weeks ago but does not know the results. He is neuropathic and does not complain of any pain, he is currently wearing crocs. 09/12/17-he is here in follow up evaluation for left plantar foot ulcer. There is improvement in both appearance and measurement. We will continue with same treatment plan and he will follow up next week. 09/19/17 on  evaluation today patient actually appears to be doing excellent in regard to his ulcer on the invitation site of his left great foot plantar aspect. He's been tolerating the dressing changes without complication. In fact with the Prisma and the current measures he has been shown signs of excellent improvement week by week up to this point. We have been to breeding the wound in this  seems to have been of great benefit for him. Fortunately there is no evidence of infection. 09/26/17 on evaluation today patient appears to be doing rather well in regard to his wound. He did not know quite as much improvement this week as compared to last week. Nonetheless he still continues to show signs of improving to some degree. I do believe he may benefit from an offloading shoe. No fevers, chills, nausea, or vomiting noted at this time. 10/03/17 on evaluation today patient actually appears to be doing much better in regard to the amputation site plantar foot ulcer. Overall this appears significantly smaller even compared to previous. He's been tolerating the dressing changes without complication. He did get his diabetic shoes and I did have a look at them they appear to be fairly good. Nonetheless I do think that for the time being I would probably recommend he continue with the offloading shoe that we have been utilizing just due to the fact that with the dressing I don't know that his diabetic shoes are going to work as appropriately as far as not called an additional pressure and irritation to the area in question. He understands. 10/10/17 on evaluation today patient actually appears to be doing very well in regard to his plantar foot ulcer. He has been tolerating the dressing changes without complication. I do feel like he's making signs of good improvement and in fact of the wound bed appears to be better although it may not be significantly changed in size it appears healthier and I do believe is showing signs of  improving. Nonetheless we gonna keep working towards healing as far as that is concerned. There is no evidence of infection. 10/17/17 on evaluation today patient actually appears to be doing poorly in regard to his plantar foot ulcer. Unfortunately other than just the area where the wound is itself there appears to be a blister that communicates with the wound unfortunately. This is more lateral to the wound itself and also to the distal point of the amputation site. There does not appear to be any evidence of cellulitis spreading at the foot but I do believe some of the drainage from the site itself is. When in nature. Nonetheless this blister area I think needs to be removed in order to allow for appropriate healing of the ulcer itself I think that is gonna be difficult for it to heal otherwise. This was discussed with the patient today. 10/24/17 on evaluation today patient actually appears to be doing better compared to last week even post debridement. He has shown signs of improvement he did test positive for Staphylococcus aureus. With that being said he notes that he's not have any discomfort and in general he does feel like things seem to been doing better. Fortunately there's no additional blistering and no evidence of remaining infection at this time. No fevers, chills, nausea, or vomiting noted Raymond [name], Raymond [name] ([id number]) [id number] Page 3 of 10 at this time. He has three days of antibiotic left. 10/31/17 on evaluation today patient actually appears to be doing very well in regard to his ulcer on the left foot. He has been tolerating the dressing changes without complication. With that being said fortunately there appears to be no evidence of infection I do think he's made progress compared to previous. No fevers, chills, nausea, or vomiting noted at this time. 11/14/17 on evaluation today patient actually appears to be doing excellent in regard to his  amputation site ulcer on his foot. In  fact this appears to be completely healed at this point. There is no evidence of infection nor underline abscess and I did thoroughly evaluate the periwound location. Overall I'm very pleased with how things appear. Readmission: 01-03-2022 upon evaluation today patient appears for reevaluation here in the clinic although it has been since 2019 that I last saw him. This again has been over 4 years. With that being said he is having an issue with a wound on his foot unfortunately. He has not had any recent x-rays he tells me at this point which is unfortunate as avoid definitely can need to get something started that can be one of the primary items to get moving forward with. With that being said I also think were probably can obtain a wound culture he is probably going to require some antibiotics at some point. I just want to make sure that we have him on the right thing when we do this. Currently the patient tells me that his wounds have been present in regard to the met head for about a month at this point. With that being said he also has a wound on the third toe which is the next toe this actually still present. This unfortunately has a lot of callus buildup around it but I think it is larger underneath than what it would appear on initial inspection. Patient does have a history of diabetes mellitus type 2, congestive heart failure, and COPD. 01-11-2022 upon evaluation patient's wounds actually are showing signs of doing about the same may be just slightly cleaner but definitely not where we want things to be as of yet. Fortunately there does not appear to be any signs of active infection locally nor systemically at this point which is great news. No fevers, chills, nausea, vomiting, or diarrhea. Of note patient has had his MRI but we do not have the results of that as of yet we are still waiting for the reading on this at this point. 01-18-2022 upon evaluation  today patient presents after having had his MRI finally complete. 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 The third and fourth digits seem to be early which is good news. Nonetheless we are still going to need to try to see about getting the infection under control he is already been on the antibiotic therapy which I think has done quite well. In fact I feel like he is showing signs of improvement already. 12/28; patient with a wound on the medial aspect of the left first metatarsal head and an area on the left third toe with slight hammer deformity. He has had a previous left first toe amputation. We are using Iodoflex on the wound. He is a diabetic a recent MRI showed osteomyelitis we have been giving him Cipro and Doxy which he appears to be tolerating well. 02-10-2022 upon evaluation today patient unfortunately has issues still with open wounds of his foot on the left. He does have known osteomyelitis at this point and that is something that we have been treating with oral antibiotics. I actually have not seen him personally since 19 December. In that time he has not had any debridement from that point until today based on what I can see as best I can tell anyway. Has been on Cipro as well as doxycycline. Nonetheless he does still seem to have open wounds today and I think that he would benefit from hyperbaric oxygen therapy.  I discussed that with him today as well. 02-18-2022 upon evaluation today patient appears to be doing about the same in regard to his wounds. Fortunately I do not see any signs of infection although he still has areas that seem to initially close down but that he has a lot of callus that still open at both locations most on the metatarsal region as well as the toe. I think that he is appropriate for proceeding with the hyperbarics and I think the sooner we get this done to try to get these wounds healed the  better off he will be. 02-25-2022 upon evaluation today patient's wounds do show signs of being dry get again. We were attempting to pack and some of the alginate dressing instead of doing the collagen as everything was getting very dry but nonetheless this is still continue to be an ongoing issue. My suggestion based on what I am seeing is good to be that we actually switch to doing Xeroform which should hopefully keep this a little bit more moist and from hopefully closing up prematurely. The patient voiced understanding. 03-03-2022 upon evaluation today patient appears to be doing well with regard to his wounds things as before appear to be getting dry. We are using Xeroform to try to keep it open is much as possible but each time I see him he does tend to cover over the good news is each time he also seems to be getting a little bit smaller which is excellent. Fortunately I do not see any evidence of active infection locally nor systemically which is great news. I think this means that between the antibiotics and the hyperbarics were really on a good track here. 03-10-2022 upon evaluation today patient appears to be doing well currently in regard to his wounds. He is actually showing signs of some good improvement he is also tolerating HBO therapy very well. I do not see any evidence of active infection locally nor systemically which is great news and overall I am extremely pleased in that regard. No fevers, chills, nausea, vomiting, or diarrhea. 03-17-2022 upon evaluation today patient appears to be doing well currently in regard to his wounds in fact the toe is completely healed. The metatarsal head location though not completely healed appears to be doing much better which is great news and overall I am extremely pleased with where we stand today. 03-24-2022 upon evaluation today patient appears to be doing well currently in regard to his wounds. He has been tolerating the dressing changes  without complication. Fortunately there does not appear to be any signs of active infection locally nor systemically in fact I am not so sure this wound is very close to being healed. I did perform some debridement to clearway some of the callus and the patient tolerated this today without complication. Postdebridement the wound bed is significantly improved. 03-31-2022 upon evaluation today patient appears to be doing well currently in regard to his wound which is actually showing signs of being completely healed. This is great news. Fortunately I do believe that the patient is doing much better he still on the oral antibiotics which I think is helping to treat the osteomyelitis is also undergoing hyperbaric oxygen therapy which I think is doing a really good job here as well. 04-21-2022 upon evaluation today patient continues to remain completely closed. He seems to be doing excellent and is very close to completion of his 40 treatments of HBO therapy which that is the point where recommend  discontinue therapy at that point. Fortunately I do not see any evidence of active infection locally nor systemically at this time which is great news. Readmission: 07-08-2022 unfortunately Raymond [name] since I last saw him ended up having an issue with osteomyelitis of his great toe right side which included having to have amputation. This amputation was performed actually on 06-08-2022 and was again amputated secondary to the osteomyelitis. With that being said following however this has dehisced and therefore the patient was referred to [place] for further evaluation and treatment of the dehisced surgical wound at the amputation site of the right great toe. His past medical history really has not changed significantly since I last saw him in the first of the year we will be doing hyperbarics for the left foot we are able to get all this healed that still is doing well according to what the patient tells me at this  point. 07-15-2022 upon evaluation today patient appears to be doing well currently in regard to his wound. This in fact is showing signs of good improvement in my opinion. Fortunately I do not see any evidence of active infection locally nor systemically which is great news. No fevers, chills, nausea, vomiting, or diarrhea. Raymond [name], Raymond [name] ([id number]) [id number] Page 4 of 10 Electronic Signature(s) Signed: 07/15/2022 11:40:05 AM By: Allen Derry PA-C Entered By: Allen Derry on 07/15/2022 11:40:05 -------------------------------------------------------------------------------- Physical Exam Details Patient Name: Date of Service: Raymond Blood D. 07/15/2022 11:00 A M Medical Record Number: [id number] Patient Account Number: [id number] Date of Birth/Sex: Treating RN: January 12, 1966 (57 y.o. Raymond [name] Other Clinician: Referring Provider: Treating Provider/Extender: Valerie Salts Weeks in Treatment: 1 Constitutional Well-nourished and well-hydrated in no acute distress. Respiratory normal breathing without difficulty. Psychiatric this patient is able to make decisions and demonstrates good insight into disease process. Alert and Oriented x 3. pleasant and cooperative. Notes Upon inspection patient's wound bed actually showed signs of good granulation epithelization at this point. Fortunately I do not see any signs of active infection which is excellent news and in general I do believe that he is really making good progress towards seeing improvement I think the antibiotics have done well over the past week. This looks much better. Of note he does see Dr. Allena Katz next week and we still are pending as far as his pathology is concerned that we did get the cultures back I think he is on a good antibiotic regimen based on the results of the culture. Electronic Signature(s) Signed: 07/15/2022 11:40:37 AM  By: Allen Derry PA-C Entered By: Allen Derry on 07/15/2022 11:40:37 -------------------------------------------------------------------------------- Physician Orders Details Patient Name: Date of Service: Raymond Blood D. 07/15/2022 11:00 A M Medical Record Number: [id number] Patient Account Number: [id number] Date of Birth/Sex: Treating RN: 08/10/1965 (57 y.o. Raymond [name] Other Clinician: Referring Provider: Treating Provider/Extender: Gwendlyn Deutscher in Treatment: 1 Verbal / Phone Orders: No Diagnosis Coding ICD-10 Coding Code Description T81.31XA Disruption of external operation (surgical) wound, not elsewhere classified, initial encounter Raymond [name], MOLIERE ([id number]) [phone].pdf Page 5 of 10 [phone] Non-pressure chronic ulcer of other part of right foot with fat layer exposed E11.621 Type 2 diabetes mellitus with foot ulcer I50.42 Chronic combined systolic (congestive) and diastolic (congestive) heart failure J44.9 Chronic obstructive pulmonary disease, unspecified Follow-up Appointments Return Appointment in 1 week. Bathing/ Shower/ Hygiene May shower; gently cleanse wound with antibacterial soap, rinse and pat dry prior  to dressing wounds Off-Loading Open toe surgical shoe Wound Treatment Wound #4 - Amputation Site - Toe Wound Laterality: Right Cleanser: Soap and Water 3 x Per Week/30 Days Discharge Instructions: Gently cleanse wound with antibacterial soap, rinse and pat dry prior to dressing wounds Prim Dressing: Silvercel Small 2x2 (in/in) (Dispense As Written) 3 x Per Week/30 Days ary Discharge Instructions: Apply Silvercel Small 2x2 (in/in) as instructed Secondary Dressing: Gauze (Generic) 3 x Per Week/30 Days Discharge Instructions: As directed: dry, moistened with saline or moistened with Dakins Solution Secured With: Medipore T - 6M Medipore H Soft Cloth Surgical  T ape ape, 2x2 (in/yd) (Generic) 3 x Per Week/30 Days Secured With: [employer] Sterile or Non-Sterile 6-ply 4.5x4 (yd/yd) (Generic) 3 x Per Week/30 Days Discharge Instructions: Apply Kerlix as directed Electronic Signature(s) Signed: 07/15/2022 1:02:01 PM By: Midge Aver MSN RN CNS WTA Signed: 07/15/2022 1:44:39 PM By: Allen Derry PA-C Entered By: Midge Aver on 07/15/2022 11:38:06 -------------------------------------------------------------------------------- Problem List Details Patient Name: Date of Service: Raymond Blood D. 07/15/2022 11:00 A M Medical Record Number: [id number] Patient Account Number: [id number] Date of Birth/Sex: Treating RN: 02-18-1965 (57 y.o. Raymond [name] Other Clinician: Referring Provider: Treating Provider/Extender: Valerie Salts Weeks in Treatment: 1 Active Problems ICD-10 Encounter Code Description Active Date MDM Diagnosis T81.31XA Disruption of external operation (surgical) wound, not elsewhere classified, 07/08/2022 No Yes initial encounter L97.512 Non-pressure chronic ulcer of other part of right foot with fat layer exposed 07/08/2022 No Yes RONDO, KOEL ([id number]) [phone].pdf Page 6 of 10 E11.621 Type 2 diabetes mellitus with foot ulcer 07/08/2022 No Yes I50.42 Chronic combined systolic (congestive) and diastolic (congestive) heart failure 07/08/2022 No Yes J44.9 Chronic obstructive pulmonary disease, unspecified 07/08/2022 No Yes Inactive Problems Resolved Problems Electronic Signature(s) Signed: 07/15/2022 11:31:08 AM By: Allen Derry PA-C Entered By: Allen Derry on 07/15/2022 11:31:08 -------------------------------------------------------------------------------- Progress Note Details Patient Name: Date of Service: Raymond Blood D. 07/15/2022 11:00 A M Medical Record Number: [id number] Patient Account Number: [id number] Date of Birth/Sex:  Treating RN: [birth year] (57 y.o. Raymond [name] Other Clinician: Referring Provider: Treating Provider/Extender: Gwendlyn Deutscher in Treatment: 1 Subjective Chief Complaint Information obtained from Patient Left foot ulcer from surgical dehiscence following 2nd toe amputation right foot History of Present Illness (HPI) 09/05/17-He is seeing an initial evaluation for a left plantar foot ulcer. He has a remote history of left great toe amputation. He states that 4-6 weeks ago he noted callus formation and ulceration. He has not seen primary care regarding this. He is not currently on antibiotic therapy. He does not routinely follow with podiatry. He states diabetic foot wear will arrive early next week. The EHR shows an A1c of 9% approximate 4 months ago but he states he had one and primary care a few weeks ago but does not know the results. He is neuropathic and does not complain of any pain, he is currently wearing crocs. 09/12/17-he is here in follow up evaluation for left plantar foot ulcer. There is improvement in both appearance and measurement. We will continue with same treatment plan and he will follow up next week. 09/19/17 on evaluation today patient actually appears to be doing excellent in regard to his ulcer on the invitation site of his left great foot plantar aspect. He's been tolerating the dressing changes without complication. In fact with the Prisma and the current measures he has been shown signs  of excellent improvement week by week up to this point. We have been to breeding the wound in this seems to have been of great benefit for him. Fortunately there is no evidence of infection. 09/26/17 on evaluation today patient appears to be doing rather well in regard to his wound. He did not know quite as much improvement this week as compared to last week. Nonetheless he still continues to show signs of improving to some degree.  I do believe he may benefit from an offloading shoe. No fevers, chills, nausea, or vomiting noted at this time. 10/03/17 on evaluation today patient actually appears to be doing much better in regard to the amputation site plantar foot ulcer. Overall this appears significantly smaller even compared to previous. He's been tolerating the dressing changes without complication. He did get his diabetic shoes and I did have a look at them they appear to be fairly good. Nonetheless I do think that for the time being I would probably recommend he continue with the offloading shoe that we have been utilizing just due to the fact that with the dressing I don't know that his diabetic shoes are going to work as appropriately as far as not called an additional pressure and irritation to the area in question. He understands. 10/10/17 on evaluation today patient actually appears to be doing very well in regard to his plantar foot ulcer. He has been tolerating the dressing changes without complication. I do feel like he's making signs of good improvement and in fact of the wound bed appears to be better although it may not be significantly changed in size it appears healthier and I do believe is showing signs of improving. Nonetheless we gonna keep working towards healing as far as that is concerned. There is no evidence of infection. 10/17/17 on evaluation today patient actually appears to be doing poorly in regard to his plantar foot ulcer. Unfortunately other than just the area where the wound is itself there appears to be a blister that communicates with the wound unfortunately. This is more lateral to the wound itself and also to the distal point Raymond [name], Raymond [name] ([id number]) [id number] Page 7 of 10 of the amputation site. There does not appear to be any evidence of cellulitis spreading at the foot but I do believe some of the drainage from the site itself is. When in nature.  Nonetheless this blister area I think needs to be removed in order to allow for appropriate healing of the ulcer itself I think that is gonna be difficult for it to heal otherwise. This was discussed with the patient today. 10/24/17 on evaluation today patient actually appears to be doing better compared to last week even post debridement. He has shown signs of improvement he did test positive for Staphylococcus aureus. With that being said he notes that he's not have any discomfort and in general he does feel like things seem to been doing better. Fortunately there's no additional blistering and no evidence of remaining infection at this time. No fevers, chills, nausea, or vomiting noted at this time. He has three days of antibiotic left. 10/31/17 on evaluation today patient actually appears to be doing very well in regard to his ulcer on the left foot. He has been tolerating the dressing changes without complication. With that being said fortunately there appears to be no evidence of infection I do think he's made progress compared to previous. No fevers, chills, nausea, or vomiting noted at this time. 11/14/17 on evaluation  today patient actually appears to be doing excellent in regard to his amputation site ulcer on his foot. In fact this appears to be completely healed at this point. There is no evidence of infection nor underline abscess and I did thoroughly evaluate the periwound location. Overall I'm very pleased with how things appear. Readmission: 01-03-2022 upon evaluation today patient appears for reevaluation here in the clinic although it has been since 2019 that I last saw him. This again has been over 4 years. With that being said he is having an issue with a wound on his foot unfortunately. He has not had any recent x-rays he tells me at this point which is unfortunate as avoid definitely can need to get something started that can be one of the primary items to get moving forward with.  With that being said I also think were probably can obtain a wound culture he is probably going to require some antibiotics at some point. I just want to make sure that we have him on the right thing when we do this. Currently the patient tells me that his wounds have been present in regard to the met head for about a month at this point. With that being said he also has a wound on the third toe which is the next toe this actually still present. This unfortunately has a lot of callus buildup around it but I think it is larger underneath than what it would appear on initial inspection. Patient does have a history of diabetes mellitus type 2, congestive heart failure, and COPD. 01-11-2022 upon evaluation patient's wounds actually are showing signs of doing about the same may be just slightly cleaner but definitely not where we want things to be as of yet. Fortunately there does not appear to be any signs of active infection locally nor systemically at this point which is great news. No fevers, chills, nausea, vomiting, or diarrhea. Of note patient has had his MRI but we do not have the results of that as of yet we are still waiting for the reading on this at this point. 01-18-2022 upon evaluation today patient presents after having had his MRI finally complete. 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 The third and fourth digits seem to be early which is good news. Nonetheless we are still going to need to try to see about getting the infection under control he is already been on the antibiotic therapy which I think has done quite well. In fact I feel like he is showing signs of improvement already. 12/28; patient with a wound on the medial aspect of the left first metatarsal head and an area on the left third toe with slight hammer deformity. He has had a previous left first toe amputation. We are using Iodoflex on the wound. He is a  diabetic a recent MRI showed osteomyelitis we have been giving him Cipro and Doxy which he appears to be tolerating well. 02-10-2022 upon evaluation today patient unfortunately has issues still with open wounds of his foot on the left. He does have known osteomyelitis at this point and that is something that we have been treating with oral antibiotics. I actually have not seen him personally since 19 December. In that time he has not had any debridement from that point until today based on what I can see as best I can tell anyway. Has been on Cipro as well as doxycycline. Nonetheless  he does still seem to have open wounds today and I think that he would benefit from hyperbaric oxygen therapy. I discussed that with him today as well. 02-18-2022 upon evaluation today patient appears to be doing about the same in regard to his wounds. Fortunately I do not see any signs of infection although he still has areas that seem to initially close down but that he has a lot of callus that still open at both locations most on the metatarsal region as well as the toe. I think that he is appropriate for proceeding with the hyperbarics and I think the sooner we get this done to try to get these wounds healed the better off he will be. 02-25-2022 upon evaluation today patient's wounds do show signs of being dry get again. We were attempting to pack and some of the alginate dressing instead of doing the collagen as everything was getting very dry but nonetheless this is still continue to be an ongoing issue. My suggestion based on what I am seeing is good to be that we actually switch to doing Xeroform which should hopefully keep this a little bit more moist and from hopefully closing up prematurely. The patient voiced understanding. 03-03-2022 upon evaluation today patient appears to be doing well with regard to his wounds things as before appear to be getting dry. We are using Xeroform to try to keep it open is much as  possible but each time I see him he does tend to cover over the good news is each time he also seems to be getting a little bit smaller which is excellent. Fortunately I do not see any evidence of active infection locally nor systemically which is great news. I think this means that between the antibiotics and the hyperbarics were really on a good track here. 03-10-2022 upon evaluation today patient appears to be doing well currently in regard to his wounds. He is actually showing signs of some good improvement he is also tolerating HBO therapy very well. I do not see any evidence of active infection locally nor systemically which is great news and overall I am extremely pleased in that regard. No fevers, chills, nausea, vomiting, or diarrhea. 03-17-2022 upon evaluation today patient appears to be doing well currently in regard to his wounds in fact the toe is completely healed. The metatarsal head location though not completely healed appears to be doing much better which is great news and overall I am extremely pleased with where we stand today. 03-24-2022 upon evaluation today patient appears to be doing well currently in regard to his wounds. He has been tolerating the dressing changes without complication. Fortunately there does not appear to be any signs of active infection locally nor systemically in fact I am not so sure this wound is very close to being healed. I did perform some debridement to clearway some of the callus and the patient tolerated this today without complication. Postdebridement the wound bed is significantly improved. 03-31-2022 upon evaluation today patient appears to be doing well currently in regard to his wound which is actually showing signs of being completely healed. This is great news. Fortunately I do believe that the patient is doing much better he still on the oral antibiotics which I think is helping to treat the osteomyelitis is also undergoing hyperbaric oxygen  therapy which I think is doing a really good job here as well. 04-21-2022 upon evaluation today patient continues to remain completely closed. He seems to be doing excellent  and is very close to completion of his 40 treatments of HBO therapy which that is the point where recommend discontinue therapy at that point. Fortunately I do not see any evidence of active infection locally nor systemically at this time which is great news. Readmission: 07-08-2022 unfortunately Raymond [name] since I last saw him ended up having an issue with osteomyelitis of his great toe right side which included having to have amputation. This amputation was performed actually on 06-08-2022 and was again amputated secondary to the osteomyelitis. With that being said following however this has dehisced and therefore the patient was referred to [place] for further evaluation and treatment of the dehisced surgical wound at the amputation site of the right great toe. His past medical history really has not changed significantly since I last saw him in the first of the year we will be doing hyperbarics for the left foot we are Raymond [name], Raymond [name] ([id number]) [id number] Page 8 of 10 able to get all this healed that still is doing well according to what the patient tells me at this point. 07-15-2022 upon evaluation today patient appears to be doing well currently in regard to his wound. This in fact is showing signs of good improvement in my opinion. Fortunately I do not see any evidence of active infection locally nor systemically which is great news. No fevers, chills, nausea, vomiting, or diarrhea. Objective Constitutional Well-nourished and well-hydrated in no acute distress. Vitals Time Taken: 11:18 AM, Height: 74 in, Weight: 398 lbs, BMI: 51.1, Temperature: 98.9 F, Pulse: 83 bpm, Respiratory Rate: 18 breaths/min, Blood Pressure: 166/73 mmHg. Respiratory normal breathing without  difficulty. Psychiatric this patient is able to make decisions and demonstrates good insight into disease process. Alert and Oriented x 3. pleasant and cooperative. General Notes: Upon inspection patient's wound bed actually showed signs of good granulation epithelization at this point. Fortunately I do not see any signs of active infection which is excellent news and in general I do believe that he is really making good progress towards seeing improvement I think the antibiotics have done well over the past week. This looks much better. Of note he does see Dr. Allena Katz next week and we still are pending as far as his pathology is concerned that we did get the cultures back I think he is on a good antibiotic regimen based on the results of the culture. Integumentary (Hair, Skin) Wound #4 status is Open. Original cause of wound was Surgical Injury. The date acquired was: 06/09/2022. The wound has been in treatment 1 weeks. The wound is located on the Right Amputation Site - T The wound measures 2.6cm length x 0.7cm width x 3.4cm depth; 1.429cm^2 area and 4.86cm^3 volume. There is oe. Fat Layer (Subcutaneous Tissue) exposed. There is a medium amount of serosanguineous drainage noted. There is small (1-33%) red, pink granulation within the wound bed. There is a small (1-33%) amount of necrotic tissue within the wound bed. Assessment Active Problems ICD-10 Disruption of external operation (surgical) wound, not elsewhere classified, initial encounter Non-pressure chronic ulcer of other part of right foot with fat layer exposed Type 2 diabetes mellitus with foot ulcer Chronic combined systolic (congestive) and diastolic (congestive) heart failure Chronic obstructive pulmonary disease, unspecified Procedures Wound #4 Pre-procedure diagnosis of Wound #4 is an Open Surgical Wound located on the Right Amputation Site - T . There was a Excisional Skin/Subcutaneous oe Tissue Debridement with a total area of  1.43 sq cm performed by Allen Derry, PA-C. With the  following instrument(s): Curette to remove Viable and Non- Viable tissue/material. Material removed includes Subcutaneous Tissue and Slough and. No specimens were taken. A time out was conducted at 11:36, prior to the start of the procedure. A Minimum amount of bleeding was controlled with Pressure. The procedure was tolerated well with a pain level of 0 throughout and a pain level of 0 following the procedure. Post Debridement Measurements: 2.6cm length x 0.7cm width x 3.4cm depth; 4.86cm^3 volume. Character of Wound/Ulcer Post Debridement is stable. Post procedure Diagnosis Wound #4: Same as Pre-Procedure Plan Follow-up Appointments: Return Appointment in 1 week. Bathing/ Shower/ Hygiene: May shower; gently cleanse wound with antibacterial soap, rinse and pat dry prior to dressing wounds Off-Loading: Open toe surgical shoe WOUND #4: - Amputation Site - T oe Wound Laterality: Right Cleanser: Soap and Water 3 x Per Week/30 Days Discharge Instructions: Gently cleanse wound with antibacterial soap, rinse and pat dry prior to dressing wounds BALDEMAR, WOLFROM ([id number]) [id number] Page 9 of 10 Prim Dressing: Silvercel Small 2x2 (in/in) (Dispense As Written) 3 x Per Week/30 Days ary Discharge Instructions: Apply Silvercel Small 2x2 (in/in) as instructed Secondary Dressing: Gauze (Generic) 3 x Per Week/30 Days Discharge Instructions: As directed: dry, moistened with saline or moistened with Dakins Solution Secured With: Medipore T - 60M Medipore H Soft Cloth Surgical T ape ape, 2x2 (in/yd) (Generic) 3 x Per Week/30 Days Secured With: [employer] Sterile or Non-Sterile 6-ply 4.5x4 (yd/yd) (Generic) 3 x Per Week/30 Days Discharge Instructions: Apply Kerlix as directed 1. Based on what I am seeing I am good recommend that the patient should go ahead and continue to monitor for any evidence of infection or  worsening. Right now he is doing well with the antibiotics. Continue as such. 2. I am also going to recommend that we continue with silver alginate dressing which I think is doing excellent for him. 3. We will continue with the roll gauze to secure in place. We will see patient back for reevaluation in 1 week here in the clinic. If anything worsens or changes patient will contact our office for additional recommendations. Were still continuing to monitor for any signs of the results back from the pathology right now is still pending. As soon as we get that we will make a decision on several things including how long he really needs to be on the antibiotic regimen. Electronic Signature(s) Signed: 07/15/2022 11:41:27 AM By: Allen Derry PA-C Entered By: Allen Derry on 07/15/2022 11:41:27 -------------------------------------------------------------------------------- SuperBill Details Patient Name: Date of Service: Raymond [name], Raymond Niemann D. 07/15/2022 Medical Record Number: [id number] Patient Account Number: [id number] Date of Birth/Sex: Treating RN: Aug 09, 1965 (57 y.o. Raymond [name] Other Clinician: Referring Provider: Treating Provider/Extender: Valerie Salts Weeks in Treatment: 1 Diagnosis Coding ICD-10 Codes Code Description T81.31XA Disruption of external operation (surgical) wound, not elsewhere classified, initial encounter L97.512 Non-pressure chronic ulcer of other part of right foot with fat layer exposed E11.621 Type 2 diabetes mellitus with foot ulcer I50.42 Chronic combined systolic (congestive) and diastolic (congestive) heart failure J44.9 Chronic obstructive pulmonary disease, unspecified Facility Procedures : CPT4 Code: [id number] Description: 11042 - DEB SUBQ TISSUE 20 SQ CM/< ICD-10 Diagnosis Description L97.512 Non-pressure chronic ulcer of other part of right foot with fat layer exposed Modifier: Quantity:  1 Physician Procedures : CPT4 Code Description Modifier [id number] 11042 - WC PHYS SUBQ TISS 20 SQ CM ICD-10 Diagnosis Description L97.512 Non-pressure chronic ulcer of other part of right foot  with fat layer exposed Quantity: 1 Electronic Signature(s) DERRELLE, BURDITT ([id number]) [id number] Page 10 of 10 Signed: 07/15/2022 11:41:39 AM By: Allen Derry PA-C Entered By: Allen Derry on 07/15/2022 11:41:39

## 2022-07-16 NOTE — Progress Notes (Signed)
COREE, HAGENOW ([id number]) [id number] Page 1 of 8 Visit Report for 07/15/2022 Arrival Information Details Patient Name: Date of Service: Raymond [name]. 07/15/2022 11:00 A M Medical Record Number: [id number] Patient Account Number: [id number] Date of Birth/Sex: Treating RN: [name] (57 y.o. Raymond [name] Primary Care Matilde Pottenger: Martie Round Other Clinician: Referring Zaydee Aina: Treating Dolorez Jeffrey/Extender: Gwendlyn Deutscher in Treatment: 1 Visit Information History Since Last Visit Added or deleted any medications: No Patient Arrived: Ambulatory Has Dressing in Place as Prescribed: Yes Arrival Time: 11:15 Pain Present Now: No Accompanied By: self Transfer Assistance: None Patient Identification Verified: Yes Secondary Verification Process Completed: Yes Patient Requires Transmission-Based Precautions: No Patient Has Alerts: Yes Patient Alerts: ABI R 1.37 L1.26 Electronic Signature(s) Signed: 07/15/2022 1:02:01 PM By: Midge Aver MSN RN CNS WTA Entered By: Midge Aver on 07/15/2022 11:18:37 -------------------------------------------------------------------------------- Clinic Level of Care Assessment Details Patient Name: Date of Service: Raymond Blood D. 07/15/2022 11:00 A M Medical Record Number: [id number] Patient Account Number: [id number] Date of Birth/Sex: Treating RN: [birth year] (57 y.o. Raymond [name] Primary Care Osmara Drummonds: Martie Round Other Clinician: Referring Tiwan Schnitker: Treating Reuben Knoblock/Extender: Gwendlyn Deutscher in Treatment: 1 Clinic Level of Care Assessment Items TOOL 1 Quantity Score []  - 0 Use when EandM and Procedure is performed on INITIAL visit ASSESSMENTS - Nursing Assessment / Reassessment []  - 0 General Physical Exam (combine w/ comprehensive assessment (listed just below) when performed on new pt. evals) []  - 0 Comprehensive Assessment (HX, ROS, Risk  Assessments, Wounds Hx, etc.) ASSESSMENTS - Wound and Skin Assessment / Reassessment []  - 0 Dermatologic / Skin Assessment (not related to wound area) ASSESSMENTS - Ostomy and/or Continence Assessment and Care AZLAAN, CRISSEY ([id number]) [id number] Page 2 of 8 []  - 0 Incontinence Assessment and Management []  - 0 Ostomy Care Assessment and Management (repouching, etc.) PROCESS - Coordination of Care []  - 0 Simple Patient / Family Education for ongoing care []  - 0 Complex (extensive) Patient / Family Education for ongoing care []  - 0 Staff obtains Chiropractor, Records, T Results / Process Orders est []  - 0 Staff telephones HHA, Nursing Homes / Clarify orders / etc []  - 0 Routine Transfer to another Facility (non-emergent condition) []  - 0 Routine Hospital Admission (non-emergent condition) []  - 0 New Admissions / Manufacturing engineer / Ordering NPWT Apligraf, etc. , []  - 0 Emergency Hospital Admission (emergent condition) PROCESS - Special Needs []  - 0 Pediatric / Minor Patient Management []  - 0 Isolation Patient Management []  - 0 Hearing / Language / Visual special needs []  - 0 Assessment of Community assistance (transportation, D/C planning, etc.) []  - 0 Additional assistance / Altered mentation []  - 0 Support Surface(s) Assessment (bed, cushion, seat, etc.) INTERVENTIONS - Miscellaneous []  - 0 External ear exam []  - 0 Patient Transfer (multiple staff / Nurse, adult / Similar devices) []  - 0 Simple Staple / Suture removal (25 or less) []  - 0 Complex Staple / Suture removal (26 or more) []  - 0 Hypo/Hyperglycemic Management (do not check if billed separately) []  - 0 Ankle / Brachial Index (ABI) - do not check if billed separately Has the patient been seen at the hospital within the last three years: Yes Total Score: 0 Level Of Care: ____ Electronic Signature(s) Signed: 07/15/2022 1:02:01 PM By: Midge Aver MSN RN CNS  WTA Entered By: Midge Aver on 07/15/2022 11:38:16 -------------------------------------------------------------------------------- Encounter Discharge Information Details Patient Name: Date of Service: Raymond [name], Raymond Blalock M D. 07/15/2022  11:00 A M Medical Record Number: [id number] Patient Account Number: [id number] Date of Birth/Sex: Treating RN: [name] (57 y.o. Raymond [name] Primary Care Dorinne Graeff: Martie Round Other Clinician: Referring [name]: Treating [name]/Extender: Gwendlyn Deutscher in Treatment: 1 Encounter Discharge Information Items Post Procedure Vitals Discharge Condition: Stable Temperature (F): 98.9 Raymond [name] ([id number]) [id number] Page 3 of 8 Ambulatory Status: Ambulatory Pulse (bpm): 83 Discharge Destination: Home Respiratory Rate (breaths/min): 16 Transportation: Private Auto Blood Pressure (mmHg): 166/73 Accompanied By: self Schedule Follow-up Appointment: Yes Clinical Summary of Care: Electronic Signature(s) Signed: 07/15/2022 1:02:01 PM By: Midge Aver MSN RN CNS WTA Entered By: Midge Aver on 07/15/2022 11:44:59 -------------------------------------------------------------------------------- Lower Extremity Assessment Details Patient Name: Date of Service: Raymond Blood D. 07/15/2022 11:00 A M Medical Record Number: [id number] Patient Account Number: [id number] Date of Birth/Sex: Treating RN: [name] (57 y.o. Raymond [name] Primary Care Sharhonda Atwood: Martie Round Other Clinician: Referring Edem Tiegs: Treating [name]/Extender: Valerie Salts Weeks in Treatment: 1 Electronic Signature(s) Signed: 07/15/2022 1:02:01 PM By: Midge Aver MSN RN CNS WTA Entered By: Midge Aver on 07/15/2022 11:26:16 -------------------------------------------------------------------------------- Multi Wound Chart Details Patient Name: Date of Service: Raymond Blood D.  07/15/2022 11:00 A M Medical Record Number: [id number] Patient Account Number: [id number] Date of Birth/Sex: Treating RN: Raymond [name] (57 y.o. Raymond [name] Primary Care Raymond [name]: Martie Round Other Clinician: Referring [name]: Treating [name]/Extender: Valerie Salts Weeks in Treatment: 1 Vital Signs Height(in): 74 Pulse(bpm): 83 Weight(lbs): 398 Blood Pressure(mmHg): 166/73 Body Mass Index(BMI): 51.1 Temperature(F): 98.9 Respiratory Rate(breaths/min): 18 [4:Photos:] [N/A:N/A] Right Amputation Site - Toe N/A N/A Wound Location: Surgical Injury N/A N/A Wounding Event: Open Surgical Wound N/A N/A Primary Etiology: Congestive Heart Failure, N/A N/A Comorbid History: Hypertension, Type II Diabetes, Neuropathy 06/09/2022 N/A N/A Date Acquired: 1 N/A N/A Weeks of Treatment: Open N/A N/A Wound Status: No N/A N/A Wound Recurrence: 2.6x0.7x3.4 N/A N/A Measurements L x W x D (cm) 1.429 N/A N/A A (cm) : rea 4.86 N/A N/A Volume (cm) : 13.30% N/A N/A % Reduction in Area: -9.10% N/A N/A % Reduction in Volume: Full Thickness Without Exposed N/A N/A Classification: Support Structures Medium N/A N/A Exudate Amount: Serosanguineous N/A N/A Exudate Type: red, brown N/A N/A Exudate Color: Small (1-33%) N/A N/A Granulation Amount: Red, Pink N/A N/A Granulation Quality: Small (1-33%) N/A N/A Necrotic Amount: Fat Layer (Subcutaneous Tissue): Yes N/A N/A Exposed Structures: Fascia: No Tendon: No Muscle: No Joint: No Bone: No Small (1-33%) N/A N/A Epithelialization: Treatment Notes Electronic Signature(s) Signed: 07/15/2022 1:02:01 PM By: Midge Aver MSN RN CNS WTA Entered By: Midge Aver on 07/15/2022 11:27:33 -------------------------------------------------------------------------------- Multi-Disciplinary Care Plan Details Patient Name: Date of Service: Raymond [name], Raymond Blalock M D. 07/15/2022 11:00 A M Medical Record Number: [id number] Patient  Account Number: [id number] Date of Birth/Sex: Treating RN: Raymond [birth year], [name] (57 y.o. Raymond [name] Primary Care Maven Varelas: Martie Round Other Clinician: Referring Laren Orama: Treating Kristie Bracewell/Extender: Gwendlyn Deutscher in Treatment: 1 Active Inactive Necrotic Tissue Nursing Diagnoses: Impaired tissue integrity related to necrotic/devitalized tissue Knowledge deficit related to management of necrotic/devitalized tissue Goals: Necrotic/devitalized tissue will be minimized in the wound bed Date Initiated: 07/08/2022 Target Resolution Date: 08/06/2022 SOLO, WILCOCK ([id number]) [id number] Page 5 of 8 Goal Status: Active Patient/caregiver will verbalize understanding of reason and process for debridement of necrotic tissue Date Initiated: 07/08/2022 Target Resolution Date: 08/06/2022 Goal Status: Active Interventions: Assess patient pain level pre-, during and post procedure and prior to discharge Provide education on  necrotic tissue and debridement process Treatment Activities: Excisional debridement : 07/08/2022 Notes: Wound/Skin Impairment Nursing Diagnoses: Impaired tissue integrity Knowledge deficit related to ulceration/compromised skin integrity Goals: Patient/caregiver will verbalize understanding of skin care regimen Date Initiated: 07/08/2022 Target Resolution Date: 08/06/2022 Goal Status: Active Ulcer/skin breakdown will have a volume reduction of 30% by week 4 Date Initiated: 07/08/2022 Target Resolution Date: 08/06/2022 Goal Status: Active Ulcer/skin breakdown will have a volume reduction of 50% by week 8 Date Initiated: 07/08/2022 Target Resolution Date: 09/07/2022 Goal Status: Active Ulcer/skin breakdown will have a volume reduction of 80% by week 12 Date Initiated: 07/08/2022 Target Resolution Date: 10/08/2022 Goal Status: Active Ulcer/skin breakdown will heal within 14 weeks Date Initiated: 07/08/2022 Target Resolution Date:  10/22/2022 Goal Status: Active Interventions: Assess patient/caregiver ability to obtain necessary supplies Assess patient/caregiver ability to perform ulcer/skin care regimen upon admission and as needed Assess ulceration(s) every visit Provide education on ulcer and skin care Screen for HBO Treatment Activities: Referred to DME Daquisha Clermont for dressing supplies : 07/08/2022 Skin care regimen initiated : 07/08/2022 Topical wound management initiated : 07/08/2022 Notes: Electronic Signature(s) Signed: 07/15/2022 1:02:01 PM By: Midge Aver MSN RN CNS WTA Entered By: Midge Aver on 07/15/2022 11:38:33 -------------------------------------------------------------------------------- Pain Assessment Details Patient Name: Date of Service: Raymond Blood D. 07/15/2022 11:00 A M Medical Record Number: [id number] Patient Account Number: [id number] Date of Birth/Sex: Treating RN: [birth year] (57 y.o. Raymond [name] Primary Care Haron Beilke: Martie Round Other Clinician: Referring Darnelle Derrick: Treating Dawaun Brancato/Extender: Valerie Salts [place] ([id number]) [id number] Page 6 of 8 Weeks in Treatment: 1 Active Problems Location of Pain Severity and Description of Pain Patient Has Paino No Site Locations Pain Management and Medication Current Pain Management: Electronic Signature(s) Signed: 07/15/2022 1:02:01 PM By: Midge Aver MSN RN CNS WTA Entered By: Midge Aver on 07/15/2022 11:20:54 -------------------------------------------------------------------------------- Patient/Caregiver Education Details Patient Name: Date of Service: Raymond [name] 6/14/2024andnbsp11:00 A M Medical Record Number: [id number] Patient Account Number: [id number] Date of Birth/Gender: Treating RN: [birth year] [name] (57 y.o. Raymond [name] Primary Care Physician: Martie Round Other Clinician: Referring Physician: Treating Physician/Extender: Gwendlyn Deutscher in Treatment: 1 Education Assessment Education Provided To: Patient Education Topics Provided Wound Debridement: Handouts: Wound Debridement Methods: Explain/Verbal Responses: State content correctly Wound/Skin Impairment: Handouts: Caring for Your Ulcer Methods: Explain/Verbal Responses: State content correctly Lucia Gaskins ([id number]) [id number] Page 7 of 8 Electronic Signature(s) Signed: 07/15/2022 1:02:01 PM By: Midge Aver MSN RN CNS WTA Entered By: Midge Aver on 07/15/2022 11:39:03 -------------------------------------------------------------------------------- Wound Assessment Details Patient Name: Date of Service: Raymond Blood D. 07/15/2022 11:00 A M Medical Record Number: [id number] Patient Account Number: [id number] Date of Birth/Sex: Treating RN: Raymond [birth year], [name] (57 y.o. Raymond [name] Primary Care Shalynn Jorstad: Martie Round Other Clinician: Referring Jillian Pianka: Treating Jarelis Ehlert/Extender: Valerie Salts Weeks in Treatment: 1 Wound Status Wound Number: 4 Primary Open Surgical Wound Etiology: Wound Location: Right Amputation Site - Toe Wound Status: Open Wounding Event: Surgical Injury Comorbid Congestive Heart Failure, Hypertension, Type II Diabetes, Date Acquired: 06/09/2022 History: Neuropathy Weeks Of Treatment: 1 Clustered Wound: No Photos Wound Measurements Length: (cm) 2.6 Width: (cm) 0.7 Depth: (cm) 3.4 Area: (cm) 1.429 Volume: (cm) 4.86 % Reduction in Area: 13.3% % Reduction in Volume: -9.1% Epithelialization: Small (1-33%) Wound Description Classification: Full Thickness Without Exposed Support Structures Exudate Amount: Medium Exudate Type: Serosanguineous Exudate Color: red, brown Foul Odor After Cleansing: No Slough/Fibrino Yes Wound Bed Granulation Amount: Small (1-33%) Exposed Structure Granulation  Quality: Red, Pink Fascia Exposed: No Necrotic Amount:  Small (1-33%) Fat Layer (Subcutaneous Tissue) Exposed: Yes Tendon Exposed: No Muscle Exposed: No Joint Exposed: No Bone Exposed: No Treatment Notes SHAHMEER, VENEZIALE ([id number]) [phone] Page 8 of 8 Wound #4 (Amputation Site - Toe) Wound Laterality: Right Cleanser Soap and Water Discharge Instruction: Gently cleanse wound with antibacterial soap, rinse and pat dry prior to dressing wounds Peri-Wound Care Topical Primary Dressing Silvercel Small 2x2 (in/in) Discharge Instruction: Apply Silvercel Small 2x2 (in/in) as instructed Secondary Dressing Gauze Discharge Instruction: As directed: dry, moistened with saline or moistened with Dakins Solution Secured With Medipore T - 24M Medipore H Soft Cloth Surgical T ape ape, 2x2 (in/yd) Kerlix Roll Sterile or Non-Sterile 6-ply 4.5x4 (yd/yd) Discharge Instruction: Apply Kerlix as directed Compression Wrap Compression Stockings Add-Ons Electronic Signature(s) Signed: 07/15/2022 1:02:01 PM By: Midge Aver MSN RN CNS WTA Entered By: Midge Aver on 07/15/2022 11:25:43 -------------------------------------------------------------------------------- Vitals Details Patient Name: Date of Service: Raymond [name], Raymond Blalock M D. 07/15/2022 11:00 A M Medical Record Number: [id number] Patient Account Number: [id number] Date of Birth/Sex: Treating RN: [birth year] [name] (57 y.o. Raymond [name] Primary Care Devyn Griffing: Martie Round Other Clinician: Referring Nehemyah Foushee: Treating Lyric Rossano/Extender: Valerie Salts Weeks in Treatment: 1 Vital Signs Time Taken: 11:18 Temperature (F): 98.9 Height (in): 74 Pulse (bpm): 83 Weight (lbs): 398 Respiratory Rate (breaths/min): 18 Body Mass Index (BMI): 51.1 Blood Pressure (mmHg): 166/73 Reference Range: 80 - 120 mg / dl Electronic Signature(s) Signed: 07/15/2022 1:02:01 PM By: Midge Aver MSN RN CNS WTA Entered By: Midge Aver on 07/15/2022 11:20:45

## 2022-07-19 ENCOUNTER — Ambulatory Visit (INDEPENDENT_AMBULATORY_CARE_PROVIDER_SITE_OTHER): Payer: Medicare HMO | Admitting: Podiatry

## 2022-07-19 DIAGNOSIS — Z89421 Acquired absence of other right toe(s): Secondary | ICD-10-CM

## 2022-07-19 NOTE — Progress Notes (Signed)
Subjective:  [name], male    DOB: 04-04-1965,  MRN: [id number]  Chief Complaint  Patient presents with   Routine Post Op    DOS: 06/09/2022 Procedure: Right second digit amputation  57 y.o. male returns for post-op check.  Patient says doing okay.  Has been not walking on it with a surgical shoe is a diabetic bandages has some strikethrough.  Denies any other acute complaints no nausea fever chills vomiting  Review of Systems: Negative except as noted in the HPI. Denies N/V/F/Ch.  Past Medical History:  Diagnosis Date   Arthritis    hands   Cellulitis    CHF (congestive heart failure) (HCC)    Chronic diastolic heart failure (HCC)    Chronic respiratory failure with hypoxia (HCC)    2 L [place] continuously   Diabetes mellitus type 2, insulin dependent (HCC)    Edema    FEET/LEGS   GERD (gastroesophageal reflux disease)    Hepatic steatosis    HOH (hard of hearing)    Hypertension    Morbid obesity (HCC)    Morbid obesity with BMI of 45.0-49.9, adult (HCC)    Neuropathy    Orthopnea    OSA on CPAP    TRILOGY VENTILATOR   Oxygen deficiency    2L  HS   PVD (peripheral vascular disease) (HCC)    Shortness of breath dyspnea    Systolic heart failure (HCC)    Preserved EF 50-55%    Current Outpatient Medications:    ACCU-CHEK SMARTVIEW test strip, , Disp: , Rfl:    aspirin EC 81 MG tablet, Take 81 mg by mouth daily., Disp: , Rfl:    atorvastatin (LIPITOR) 40 MG tablet, atorvastatin 40 mg tablet, Disp: , Rfl:    carvedilol (COREG) 6.25 MG tablet, Take 1 tablet (6.25 mg total) by mouth 2 (two) times daily., Disp: 60 tablet, Rfl: 11   doxycycline (VIBRA-TABS) 100 MG tablet, Take 1 tablet (100 mg total) by mouth every 12 (twelve) hours for 10 days., Disp: 20 tablet, Rfl: 0   empagliflozin (JARDIANCE) 25 MG TABS tablet, Take by mouth daily. Once daily in the am, Disp: , Rfl:    gabapentin (NEURONTIN) 300 MG capsule, Take 300 mg by mouth 3 (three)  times daily., Disp: , Rfl:    insulin lispro (HUMALOG) 100 UNIT/ML cartridge, Inject 60 Units into the skin 3 (three) times daily with meals., Disp: , Rfl:    LANTUS 100 UNIT/ML injection, Inject 90 Units into the skin 2 (two) times daily., Disp: , Rfl:    liraglutide (VICTOZA) 18 MG/3ML SOPN, Inject 1.2 mg into the skin in the morning and at bedtime., Disp: , Rfl:    losartan (COZAAR) 50 MG tablet, Take 50 mg by mouth daily., Disp: , Rfl:    metFORMIN (GLUCOPHAGE) 1000 MG tablet, Take 1,000 mg by mouth 2 (two) times daily with a meal., Disp: , Rfl:    Multiple Vitamins-Minerals (CENTRUM SILVER 50+MEN) TABS, Take 1 tablet by mouth daily., Disp: , Rfl:    omega-3 acid ethyl esters (LOVAZA) 1 g capsule, Take 1 g by mouth daily., Disp: , Rfl:    OXYGEN, Inhale 2 L into the lungs daily., Disp: , Rfl:    pantoprazole (PROTONIX) 40 MG tablet, Take 40 mg by mouth daily., Disp: , Rfl:    torsemide (DEMADEX) 20 MG tablet, Take 2 tablets (40 mg total) by mouth daily., Disp: 60 tablet, Rfl: 2   TRUEPLUS INSULIN SYRINGE 29G  X 1/2" 1 ML MISC, , Disp: , Rfl:    TRUEPLUS PEN NEEDLES 31G X 8 MM MISC, , Disp: , Rfl:   Social History   Tobacco Use  Smoking Status Never  Smokeless Tobacco Never    No Known Allergies Objective:  There were no vitals filed for this visit. There is no height or weight on file to calculate BMI. Constitutional Well developed. Well nourished.  Vascular Foot warm and well perfused. Capillary refill normal to all digits.   Neurologic Normal speech. Oriented to person, place, and time. Epicritic sensation to light touch grossly present bilaterally.  Dermatologic Deep skin is healing well.  Patient has superficial dehiscence no purulent drainage nice granular fiber wound noted  Orthopedic: No further tenderness to palpation noted about the surgical site.   Radiographs: None Assessment:   1. History of amputation of lesser toe of right foot (HCC)    Plan:  Patient was  evaluated and treated and all questions answered.  S/p foot surgery right with amputation site wound -Clinically doing well.  It is healing slowly.  Patient is going to wound care center.  I will see him back again in 3 months to follow-up from of peripheral.  He is primarily being managed with a 1%  No follow-ups on file.

## 2022-07-25 ENCOUNTER — Encounter: Payer: Medicare HMO | Admitting: Physician Assistant

## 2022-07-25 DIAGNOSIS — E11621 Type 2 diabetes mellitus with foot ulcer: Secondary | ICD-10-CM | POA: Diagnosis not present

## 2022-07-25 NOTE — Progress Notes (Signed)
Raymond [name] ([id number]) [id number] Page 1 of 9 Visit Report for 07/25/2022 Chief Complaint Document Details Patient Name: Date of Service: Raymond [name] 07/25/2022 8:30 A M Medical Record Number: [id number] Patient Account Number: [id number] Date of Birth/Sex: Treating RN: [birth year] (57 y.o. Raymond [name] Primary Care Provider: Martie Round Other Clinician: Referring Provider: Treating Provider/Extender: Valerie Salts Weeks in Treatment: 2 Information Obtained from: Patient Chief Complaint Left foot ulcer from surgical dehiscence following 2nd toe amputation right foot Electronic Signature(s) Signed: 07/25/2022 8:46:09 AM By: Allen Derry PA-C Entered By: Allen Derry on 07/25/2022 08:46:09 -------------------------------------------------------------------------------- Debridement Details Patient Name: Date of Service: Raymond Blood D. 07/25/2022 8:30 A M Medical Record Number: [id number] Patient Account Number: [id number] Date of Birth/Sex: Treating RN: [birth year] (57 y.o. Raymond [name] Primary Care Provider: Martie Round Other Clinician: Referring Provider: Treating Provider/Extender: Valerie Salts Weeks in Treatment: 2 Debridement Performed for Assessment: Wound #4 Right Amputation Site - Toe Performed By: Physician Allen Derry, PA-C Debridement Type: Debridement Level of Consciousness (Pre-procedure): Awake and Alert Pre-procedure Verification/Time Out Yes - 09:08 Taken: Start Time: 09:08 Percent of Wound Bed Debrided: 100% T Area Debrided (cm): otal 0.78 Tissue and other material debrided: Viable, Non-Viable, Slough, Subcutaneous, Slough Level: Skin/Subcutaneous Tissue Debridement Description: Excisional Instrument: Curette Bleeding: Minimum Hemostasis Achieved: Pressure Procedural Pain: 0 Post Procedural Pain: 0 Response to Treatment: Procedure was tolerated well Level of  Consciousness Arlie SolomonsGARLAND, HINCAPIE ([id number]) [id number] Page 2 of 9 Level of Consciousness (Post- Awake and Alert procedure): Post Debridement Measurements of Total Wound Length: (cm) 2 Width: (cm) 0.5 Depth: (cm) 2.3 Volume: (cm) 1.806 Character of Wound/Ulcer Post Debridement: Stable Post Procedure Diagnosis Same as Pre-procedure Electronic Signature(s) Signed: 07/25/2022 9:09:53 AM By: Midge Aver MSN RN CNS WTA Signed: 07/25/2022 5:56:33 PM By: Allen Derry PA-C Entered By: Midge Aver on 07/25/2022 09:09:53 -------------------------------------------------------------------------------- HPI Details Patient Name: Date of Service: Raymond [name], Raymond Niemann D. 07/25/2022 8:30 A M Medical Record Number: [id number] Patient Account Number: [id number] Date of Birth/Sex: Treating RN: Dec 20, 1965 (57 y.o. Raymond [name] Primary Care Provider: Martie Round Other Clinician: Referring Provider: Treating Provider/Extender: Valerie Salts Weeks in Treatment: 2 History of Present Illness HPI Description: 09/05/17-He is seeing an initial evaluation for a left plantar foot ulcer. He has a remote history of left great toe amputation. He states that 4-6 weeks ago he noted callus formation and ulceration. He has not seen primary care regarding this. He is not currently on antibiotic therapy. He does not routinely follow with podiatry. He states diabetic foot wear will arrive early next week. The EHR shows an A1c of 9% approximate 4 months ago but he states he had one and primary care a few weeks ago but does not know the results. He is neuropathic and does not complain of any pain, he is currently wearing crocs. 09/12/17-he is here in follow up evaluation for left plantar foot ulcer. There is improvement in both appearance and measurement. We will continue with same treatment plan and he will follow up next week. 09/19/17 on evaluation today patient  actually appears to be doing excellent in regard to his ulcer on the invitation site of his left great foot plantar aspect. He's been tolerating the dressing changes without complication. In fact with the Prisma and the current measures he has been shown signs of excellent improvement week by week up to this point. We have been to breeding the wound in this  seems to have been of great benefit for him. Fortunately there is no evidence of infection. 09/26/17 on evaluation today patient appears to be doing rather well in regard to his wound. He did not know quite as much improvement this week as compared to last week. Nonetheless he still continues to show signs of improving to some degree. I do believe he may benefit from an offloading shoe. No fevers, chills, nausea, or vomiting noted at this time. 10/03/17 on evaluation today patient actually appears to be doing much better in regard to the amputation site plantar foot ulcer. Overall this appears significantly smaller even compared to previous. He's been tolerating the dressing changes without complication. He did get his diabetic shoes and I did have a look at them they appear to be fairly good. Nonetheless I do think that for the time being I would probably recommend he continue with the offloading shoe that we have been utilizing just due to the fact that with the dressing I don't know that his diabetic shoes are going to work as appropriately as far as not called an additional pressure and irritation to the area in question. He understands. 10/10/17 on evaluation today patient actually appears to be doing very well in regard to his plantar foot ulcer. He has been tolerating the dressing changes without complication. I do feel like he's making signs of good improvement and in fact of the wound bed appears to be better although it may not be significantly changed in size it appears healthier and I do believe is showing signs of improving. Nonetheless we  gonna keep working towards healing as far as that is concerned. There is no evidence of infection. 10/17/17 on evaluation today patient actually appears to be doing poorly in regard to his plantar foot ulcer. Unfortunately other than just the area where the wound is itself there appears to be a blister that communicates with the wound unfortunately. This is more lateral to the wound itself and also to the distal point of the amputation site. There does not appear to be any evidence of cellulitis spreading at the foot but I do believe some of the drainage from the site itself is. When in nature. Nonetheless this blister area I think needs to be removed in order to allow for appropriate healing of the ulcer itself I think that is gonna be difficult for it to heal otherwise. This was discussed with the patient today. 10/24/17 on evaluation today patient actually appears to be doing better compared to last week even post debridement. He has shown signs of improvement he did test positive for Staphylococcus aureus. With that being said he notes that he's not have any discomfort and in general he does feel like things seem to been doing better. Fortunately there's no additional blistering and no evidence of remaining infection at this time. No fevers, chills, nausea, or vomiting noted ALI, MOHL ([id number]) [id number] Page 3 of 9 at this time. He has three days of antibiotic left. 10/31/17 on evaluation today patient actually appears to be doing very well in regard to his ulcer on the left foot. He has been tolerating the dressing changes without complication. With that being said fortunately there appears to be no evidence of infection I do think he's made progress compared to previous. No fevers, chills, nausea, or vomiting noted at this time. 11/14/17 on evaluation today patient actually appears to be doing excellent in regard to his amputation site ulcer on his  foot. In  fact this appears to be completely healed at this point. There is no evidence of infection nor underline abscess and I did thoroughly evaluate the periwound location. Overall I'm very pleased with how things appear. Readmission: 01-03-2022 upon evaluation today patient appears for reevaluation here in the clinic although it has been since 2019 that I last saw him. This again has been over 4 years. With that being said he is having an issue with a wound on his foot unfortunately. He has not had any recent x-rays he tells me at this point which is unfortunate as avoid definitely can need to get something started that can be one of the primary items to get moving forward with. With that being said I also think were probably can obtain a wound culture he is probably going to require some antibiotics at some point. I just want to make sure that we have him on the right thing when we do this. Currently the patient tells me that his wounds have been present in regard to the met head for about a month at this point. With that being said he also has a wound on the third toe which is the next toe this actually still present. This unfortunately has a lot of callus buildup around it but I think it is larger underneath than what it would appear on initial inspection. Patient does have a history of diabetes mellitus type 2, congestive heart failure, and COPD. 01-11-2022 upon evaluation patient's wounds actually are showing signs of doing about the same may be just slightly cleaner but definitely not where we want things to be as of yet. Fortunately there does not appear to be any signs of active infection locally nor systemically at this point which is great news. No fevers, chills, nausea, vomiting, or diarrhea. Of note patient has had his MRI but we do not have the results of that as of yet we are still waiting for the reading on this at this point. 01-18-2022 upon evaluation today patient presents after  having had his MRI finally complete. 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 The third and fourth digits seem to be early which is good news. Nonetheless we are still going to need to try to see about getting the infection under control he is already been on the antibiotic therapy which I think has done quite well. In fact I feel like he is showing signs of improvement already. 12/28; patient with a wound on the medial aspect of the left first metatarsal head and an area on the left third toe with slight hammer deformity. He has had a previous left first toe amputation. We are using Iodoflex on the wound. He is a diabetic a recent MRI showed osteomyelitis we have been giving him Cipro and Doxy which he appears to be tolerating well. 02-10-2022 upon evaluation today patient unfortunately has issues still with open wounds of his foot on the left. He does have known osteomyelitis at this point and that is something that we have been treating with oral antibiotics. I actually have not seen him personally since 19 December. In that time he has not had any debridement from that point until today based on what I can see as best I can tell anyway. Has been on Cipro as well as doxycycline. Nonetheless he does still seem to have open wounds today and I think that he would benefit from hyperbaric oxygen therapy.  I discussed that with him today as well. 02-18-2022 upon evaluation today patient appears to be doing about the same in regard to his wounds. Fortunately I do not see any signs of infection although he still has areas that seem to initially close down but that he has a lot of callus that still open at both locations most on the metatarsal region as well as the toe. I think that he is appropriate for proceeding with the hyperbarics and I think the sooner we get this done to try to get these wounds healed the better off he will  be. 02-25-2022 upon evaluation today patient's wounds do show signs of being dry get again. We were attempting to pack and some of the alginate dressing instead of doing the collagen as everything was getting very dry but nonetheless this is still continue to be an ongoing issue. My suggestion based on what I am seeing is good to be that we actually switch to doing Xeroform which should hopefully keep this a little bit more moist and from hopefully closing up prematurely. The patient voiced understanding. 03-03-2022 upon evaluation today patient appears to be doing well with regard to his wounds things as before appear to be getting dry. We are using Xeroform to try to keep it open is much as possible but each time I see him he does tend to cover over the good news is each time he also seems to be getting a little bit smaller which is excellent. Fortunately I do not see any evidence of active infection locally nor systemically which is great news. I think this means that between the antibiotics and the hyperbarics were really on a good track here. 03-10-2022 upon evaluation today patient appears to be doing well currently in regard to his wounds. He is actually showing signs of some good improvement he is also tolerating HBO therapy very well. I do not see any evidence of active infection locally nor systemically which is great news and overall I am extremely pleased in that regard. No fevers, chills, nausea, vomiting, or diarrhea. 03-17-2022 upon evaluation today patient appears to be doing well currently in regard to his wounds in fact the toe is completely healed. The metatarsal head location though not completely healed appears to be doing much better which is great news and overall I am extremely pleased with where we stand today. 03-24-2022 upon evaluation today patient appears to be doing well currently in regard to his wounds. He has been tolerating the dressing changes without complication.  Fortunately there does not appear to be any signs of active infection locally nor systemically in fact I am not so sure this wound is very close to being healed. I did perform some debridement to clearway some of the callus and the patient tolerated this today without complication. Postdebridement the wound bed is significantly improved. 03-31-2022 upon evaluation today patient appears to be doing well currently in regard to his wound which is actually showing signs of being completely healed. This is great news. Fortunately I do believe that the patient is doing much better he still on the oral antibiotics which I think is helping to treat the osteomyelitis is also undergoing hyperbaric oxygen therapy which I think is doing a really good job here as well. 04-21-2022 upon evaluation today patient continues to remain completely closed. He seems to be doing excellent and is very close to completion of his 40 treatments of HBO therapy which that is the point where recommend  discontinue therapy at that point. Fortunately I do not see any evidence of active infection locally nor systemically at this time which is great news. Readmission: 07-08-2022 unfortunately Mr. Pellot since I last saw him ended up having an issue with osteomyelitis of his great toe right side which included having to have amputation. This amputation was performed actually on 06-08-2022 and was again amputated secondary to the osteomyelitis. With that being said following however this has dehisced and therefore the patient was referred to [place] for further evaluation and treatment of the dehisced surgical wound at the amputation site of the right great toe. His past medical history really has not changed significantly since I last saw him in the first of the year we will be doing hyperbarics for the left foot we are able to get all this healed that still is doing well according to what the patient tells me at this point. 07-15-2022 upon  evaluation today patient appears to be doing well currently in regard to his wound. This in fact is showing signs of good improvement in my opinion. Fortunately I do not see any evidence of active infection locally nor systemically which is great news. No fevers, chills, nausea, vomiting, or diarrhea. 07-25-2022 upon evaluation today patient appears to be doing well currently in regard to his foot ulcer is actually measuring smaller and looking better I am OTHMAR, RINGER ([id number]) [id number] Page 4 of 9 actually pleased in that regard. We did get the results back from his pathology that showed necrotic bone but nothing that appears to be actually open at this point. Fortunately I do not see any signs of active infection at this time. Electronic Signature(s) Signed: 07/25/2022 9:30:17 AM By: Allen Derry PA-C Entered By: Allen Derry on 07/25/2022 09:30:17 -------------------------------------------------------------------------------- Physical Exam Details Patient Name: Date of Service: Raymond Blood D. 07/25/2022 8:30 A M Medical Record Number: [id number] Patient Account Number: [id number] Date of Birth/Sex: Treating RN: [birth year] (57 y.o. Raymond [name] Primary Care Provider: Martie Round Other Clinician: Referring Provider: Treating Provider/Extender: Valerie Salts Weeks in Treatment: 2 Constitutional Well-nourished and well-hydrated in no acute distress. Respiratory normal breathing without difficulty. Psychiatric this patient is able to make decisions and demonstrates good insight into disease process. Alert and Oriented x 3. pleasant and cooperative. Notes Upon inspection patient's wound bed actually showed signs of improvement there is no direct bone exposure anything needs to be cleared away at this point which is good news and in general I do think that were moving in the right direction here. Continue following  debridement today with silver alginate which I think is probably still the best way to go currently. Electronic Signature(s) Signed: 07/25/2022 9:30:49 AM By: Allen Derry PA-C Entered By: Allen Derry on 07/25/2022 09:30:49 -------------------------------------------------------------------------------- Physician Orders Details Patient Name: Date of Service: Raymond Blood D. 07/25/2022 8:30 A M Medical Record Number: [id number] Patient Account Number: [id number] Date of Birth/Sex: Treating RN: [birth year] (57 y.o. Raymond [name] Primary Care Provider: Martie Round Other Clinician: Referring Provider: Treating Provider/Extender: Gwendlyn Deutscher in Treatment: 2 Verbal / Phone Orders: No Diagnosis [address] LEEMAN, JOHNSEY ([id number]) [id number] Page 5 of 9 Follow-up Appointments Return Appointment in 1 week. Bathing/ Shower/ Hygiene May shower; gently cleanse wound with antibacterial soap, rinse and pat dry prior to dressing wounds Off-Loading Open toe surgical shoe Wound Treatment Wound #4 - Amputation Site - Toe Wound Laterality: Right Cleanser: Soap and Water 3 x Per Week/30  Days Discharge Instructions: Gently cleanse wound with antibacterial soap, rinse and pat dry prior to dressing wounds Prim Dressing: Silvercel Small 2x2 (in/in) (DME) (Dispense As Written) 3 x Per Week/30 Days ary Discharge Instructions: Apply Silvercel Small 2x2 (in/in) as instructed Secondary Dressing: Gauze (DME) (Generic) 3 x Per Week/30 Days Discharge Instructions: As directed: dry, moistened with saline or moistened with Dakins Solution Secured With: Medipore T - 28M Medipore H Soft Cloth Surgical T ape ape, 2x2 (in/yd) (DME) (Generic) 3 x Per Week/30 Days Secured With: [employer] Sterile or Non-Sterile 6-ply 4.5x4 (yd/yd) (DME) (Generic) 3 x Per Week/30 Days Discharge Instructions: Apply Kerlix as directed Electronic Signature(s) Signed:  07/25/2022 5:22:58 PM By: Midge Aver MSN RN CNS WTA Signed: 07/25/2022 5:56:33 PM By: Allen Derry PA-C Entered By: Midge Aver on 07/25/2022 09:10:18 -------------------------------------------------------------------------------- Problem List Details Patient Name: Date of Service: Raymond Blood D. 07/25/2022 8:30 A M Medical Record Number: [id number] Patient Account Number: [id number] Date of Birth/Sex: Treating RN: [birth year] (57 y.o. Raymond [name] Primary Care Provider: Martie Round Other Clinician: Referring Provider: Treating Provider/Extender: Valerie Salts Weeks in Treatment: 2 Active Problems ICD-10 Encounter Code Description Active Date MDM Diagnosis T81.31XA Disruption of external operation (surgical) wound, not elsewhere classified, 07/08/2022 No Yes initial encounter L97.512 Non-pressure chronic ulcer of other part of right foot with fat layer exposed 07/08/2022 No Yes E11.621 Type 2 diabetes mellitus with foot ulcer 07/08/2022 No Yes I50.42 Chronic combined systolic (congestive) and diastolic (congestive) heart failure 07/08/2022 No Yes GREGARY, BLACKARD ([id number]) [phone].pdf Page 6 of 9 J44.9 Chronic obstructive pulmonary disease, unspecified 07/08/2022 No Yes Inactive Problems Resolved Problems Electronic Signature(s) Signed: 07/25/2022 8:46:00 AM By: Allen Derry PA-C Entered By: Allen Derry on 07/25/2022 08:46:00 -------------------------------------------------------------------------------- Progress Note Details Patient Name: Date of Service: Raymond [name], Raymond Niemann D. 07/25/2022 8:30 A M Medical Record Number: [id number] Patient Account Number: [id number] Date of Birth/Sex: Treating RN: 1965-09-10 (57 y.o. Raymond [name] Primary Care Provider: Martie Round Other Clinician: Referring Provider: Treating Provider/Extender: Gwendlyn Deutscher in Treatment: 2 Subjective Chief  Complaint Information obtained from Patient Left foot ulcer from surgical dehiscence following 2nd toe amputation right foot History of Present Illness (HPI) 09/05/17-He is seeing an initial evaluation for a left plantar foot ulcer. He has a remote history of left great toe amputation. He states that 4-6 weeks ago he noted callus formation and ulceration. He has not seen primary care regarding this. He is not currently on antibiotic therapy. He does not routinely follow with podiatry. He states diabetic foot wear will arrive early next week. The EHR shows an A1c of 9% approximate 4 months ago but he states he had one and primary care a few weeks ago but does not know the results. He is neuropathic and does not complain of any pain, he is currently wearing crocs. 09/12/17-he is here in follow up evaluation for left plantar foot ulcer. There is improvement in both appearance and measurement. We will continue with same treatment plan and he will follow up next week. 09/19/17 on evaluation today patient actually appears to be doing excellent in regard to his ulcer on the invitation site of his left great foot plantar aspect. He's been tolerating the dressing changes without complication. In fact with the Prisma and the current measures he has been shown signs of excellent improvement week by week up to this point. We have been to breeding the wound in this seems to have been of  great benefit for him. Fortunately there is no evidence of infection. 09/26/17 on evaluation today patient appears to be doing rather well in regard to his wound. He did not know quite as much improvement this week as compared to last week. Nonetheless he still continues to show signs of improving to some degree. I do believe he may benefit from an offloading shoe. No fevers, chills, nausea, or vomiting noted at this time. 10/03/17 on evaluation today patient actually appears to be doing much better in regard to the amputation site  plantar foot ulcer. Overall this appears significantly smaller even compared to previous. He's been tolerating the dressing changes without complication. He did get his diabetic shoes and I did have a look at them they appear to be fairly good. Nonetheless I do think that for the time being I would probably recommend he continue with the offloading shoe that we have been utilizing just due to the fact that with the dressing I don't know that his diabetic shoes are going to work as appropriately as far as not called an additional pressure and irritation to the area in question. He understands. 10/10/17 on evaluation today patient actually appears to be doing very well in regard to his plantar foot ulcer. He has been tolerating the dressing changes without complication. I do feel like he's making signs of good improvement and in fact of the wound bed appears to be better although it may not be significantly changed in size it appears healthier and I do believe is showing signs of improving. Nonetheless we gonna keep working towards healing as far as that is concerned. There is no evidence of infection. 10/17/17 on evaluation today patient actually appears to be doing poorly in regard to his plantar foot ulcer. Unfortunately other than just the area where the wound is itself there appears to be a blister that communicates with the wound unfortunately. This is more lateral to the wound itself and also to the distal point of the amputation site. There does not appear to be any evidence of cellulitis spreading at the foot but I do believe some of the drainage from the site itself is. When in nature. Nonetheless this blister area I think needs to be removed in order to allow for appropriate healing of the ulcer itself I think that is gonna be difficult for it to heal otherwise. This was discussed with the patient today. 10/24/17 on evaluation today patient actually appears to be doing better compared to last  week even post debridement. He has shown signs of improvement he did test positive for Staphylococcus aureus. With that being said he notes that he's not have any discomfort and in general he does feel like things seem to been doing better. Fortunately there's no additional blistering and no evidence of remaining infection at this time. No fevers, chills, nausea, or vomiting noted at this time. He has three days of antibiotic left. DAVE, MERGEN ([id number]) [id number] Page 7 of 9 10/31/17 on evaluation today patient actually appears to be doing very well in regard to his ulcer on the left foot. He has been tolerating the dressing changes without complication. With that being said fortunately there appears to be no evidence of infection I do think he's made progress compared to previous. No fevers, chills, nausea, or vomiting noted at this time. 11/14/17 on evaluation today patient actually appears to be doing excellent in regard to his amputation site ulcer on his foot. In fact this appears to be  completely healed at this point. There is no evidence of infection nor underline abscess and I did thoroughly evaluate the periwound location. Overall I'm very pleased with how things appear. Readmission: 01-03-2022 upon evaluation today patient appears for reevaluation here in the clinic although it has been since 2019 that I last saw him. This again has been over 4 years. With that being said he is having an issue with a wound on his foot unfortunately. He has not had any recent x-rays he tells me at this point which is unfortunate as avoid definitely can need to get something started that can be one of the primary items to get moving forward with. With that being said I also think were probably can obtain a wound culture he is probably going to require some antibiotics at some point. I just want to make sure that we have him on the right thing when we do this. Currently  the patient tells me that his wounds have been present in regard to the met head for about a month at this point. With that being said he also has a wound on the third toe which is the next toe this actually still present. This unfortunately has a lot of callus buildup around it but I think it is larger underneath than what it would appear on initial inspection. Patient does have a history of diabetes mellitus type 2, congestive heart failure, and COPD. 01-11-2022 upon evaluation patient's wounds actually are showing signs of doing about the same may be just slightly cleaner but definitely not where we want things to be as of yet. Fortunately there does not appear to be any signs of active infection locally nor systemically at this point which is great news. No fevers, chills, nausea, vomiting, or diarrhea. Of note patient has had his MRI but we do not have the results of that as of yet we are still waiting for the reading on this at this point. 01-18-2022 upon evaluation today patient presents after having had his MRI finally complete. 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 The third and fourth digits seem to be early which is good news. Nonetheless we are still going to need to try to see about getting the infection under control he is already been on the antibiotic therapy which I think has done quite well. In fact I feel like he is showing signs of improvement already. 12/28; patient with a wound on the medial aspect of the left first metatarsal head and an area on the left third toe with slight hammer deformity. He has had a previous left first toe amputation. We are using Iodoflex on the wound. He is a diabetic a recent MRI showed osteomyelitis we have been giving him Cipro and Doxy which he appears to be tolerating well. 02-10-2022 upon evaluation today patient unfortunately has issues still with open wounds of his foot on  the left. He does have known osteomyelitis at this point and that is something that we have been treating with oral antibiotics. I actually have not seen him personally since 19 December. In that time he has not had any debridement from that point until today based on what I can see as best I can tell anyway. Has been on Cipro as well as doxycycline. Nonetheless he does still seem to have open wounds today and I think that he would benefit from hyperbaric oxygen therapy. I discussed that with  him today as well. 02-18-2022 upon evaluation today patient appears to be doing about the same in regard to his wounds. Fortunately I do not see any signs of infection although he still has areas that seem to initially close down but that he has a lot of callus that still open at both locations most on the metatarsal region as well as the toe. I think that he is appropriate for proceeding with the hyperbarics and I think the sooner we get this done to try to get these wounds healed the better off he will be. 02-25-2022 upon evaluation today patient's wounds do show signs of being dry get again. We were attempting to pack and some of the alginate dressing instead of doing the collagen as everything was getting very dry but nonetheless this is still continue to be an ongoing issue. My suggestion based on what I am seeing is good to be that we actually switch to doing Xeroform which should hopefully keep this a little bit more moist and from hopefully closing up prematurely. The patient voiced understanding. 03-03-2022 upon evaluation today patient appears to be doing well with regard to his wounds things as before appear to be getting dry. We are using Xeroform to try to keep it open is much as possible but each time I see him he does tend to cover over the good news is each time he also seems to be getting a little bit smaller which is excellent. Fortunately I do not see any evidence of active infection locally nor  systemically which is great news. I think this means that between the antibiotics and the hyperbarics were really on a good track here. 03-10-2022 upon evaluation today patient appears to be doing well currently in regard to his wounds. He is actually showing signs of some good improvement he is also tolerating HBO therapy very well. I do not see any evidence of active infection locally nor systemically which is great news and overall I am extremely pleased in that regard. No fevers, chills, nausea, vomiting, or diarrhea. 03-17-2022 upon evaluation today patient appears to be doing well currently in regard to his wounds in fact the toe is completely healed. The metatarsal head location though not completely healed appears to be doing much better which is great news and overall I am extremely pleased with where we stand today. 03-24-2022 upon evaluation today patient appears to be doing well currently in regard to his wounds. He has been tolerating the dressing changes without complication. Fortunately there does not appear to be any signs of active infection locally nor systemically in fact I am not so sure this wound is very close to being healed. I did perform some debridement to clearway some of the callus and the patient tolerated this today without complication. Postdebridement the wound bed is significantly improved. 03-31-2022 upon evaluation today patient appears to be doing well currently in regard to his wound which is actually showing signs of being completely healed. This is great news. Fortunately I do believe that the patient is doing much better he still on the oral antibiotics which I think is helping to treat the osteomyelitis is also undergoing hyperbaric oxygen therapy which I think is doing a really good job here as well. 04-21-2022 upon evaluation today patient continues to remain completely closed. He seems to be doing excellent and is very close to completion of his 40 treatments of  HBO therapy which that is the point where recommend discontinue therapy at that  point. Fortunately I do not see any evidence of active infection locally nor systemically at this time which is great news. Readmission: 07-08-2022 unfortunately Mr. Boody since I last saw him ended up having an issue with osteomyelitis of his great toe right side which included having to have amputation. This amputation was performed actually on 06-08-2022 and was again amputated secondary to the osteomyelitis. With that being said following however this has dehisced and therefore the patient was referred to [place] for further evaluation and treatment of the dehisced surgical wound at the amputation site of the right great toe. His past medical history really has not changed significantly since I last saw him in the first of the year we will be doing hyperbarics for the left foot we are able to get all this healed that still is doing well according to what the patient tells me at this point. 07-15-2022 upon evaluation today patient appears to be doing well currently in regard to his wound. This in fact is showing signs of good improvement in my opinion. Fortunately I do not see any evidence of active infection locally nor systemically which is great news. No fevers, chills, nausea, vomiting, or diarrhea. 07-25-2022 upon evaluation today patient appears to be doing well currently in regard to his foot ulcer is actually measuring smaller and looking better I am actually pleased in that regard. We did get the results back from his pathology that showed necrotic bone but nothing that appears to be actually open at this ADAR, RASE ([id number]) [id number] Page 8 of 9 point. Fortunately I do not see any signs of active infection at this time. Objective Constitutional Well-nourished and well-hydrated in no acute distress. Vitals Time Taken: 8:37 AM, Height: 74 in, Weight: 398 lbs, BMI:  51.1, Temperature: 98.4 F, Pulse: 77 bpm, Respiratory Rate: 18 breaths/min, Blood Pressure: 124/63 mmHg. Respiratory normal breathing without difficulty. Psychiatric this patient is able to make decisions and demonstrates good insight into disease process. Alert and Oriented x 3. pleasant and cooperative. General Notes: Upon inspection patient's wound bed actually showed signs of improvement there is no direct bone exposure anything needs to be cleared away at this point which is good news and in general I do think that were moving in the right direction here. Continue following debridement today with silver alginate which I think is probably still the best way to go currently. Integumentary (Hair, Skin) Wound #4 status is Open. Original cause of wound was Surgical Injury. The date acquired was: 06/09/2022. The wound has been in treatment 2 weeks. The wound is located on the Right Amputation Site - T The wound measures 2cm length x 0.5cm width x 2.2cm depth; 0.785cm^2 area and 1.728cm^3 volume. There is oe. Fat Layer (Subcutaneous Tissue) exposed. There is a medium amount of serosanguineous drainage noted. There is small (1-33%) red, pink granulation within the wound bed. There is a small (1-33%) amount of necrotic tissue within the wound bed. Assessment Active Problems ICD-10 Disruption of external operation (surgical) wound, not elsewhere classified, initial encounter Non-pressure chronic ulcer of other part of right foot with fat layer exposed Type 2 diabetes mellitus with foot ulcer Chronic combined systolic (congestive) and diastolic (congestive) heart failure Chronic obstructive pulmonary disease, unspecified Procedures Wound #4 Pre-procedure diagnosis of Wound #4 is an Open Surgical Wound located on the Right Amputation Site - T . There was a Excisional Skin/Subcutaneous oe Tissue Debridement with a total area of 0.78 sq cm performed by Allen Derry, PA-C. With  the following  instrument(s): Curette to remove Viable and Non- Viable tissue/material. Material removed includes Subcutaneous Tissue and Slough and. No specimens were taken. A time out was conducted at 09:08, prior to the start of the procedure. A Minimum amount of bleeding was controlled with Pressure. The procedure was tolerated well with a pain level of 0 throughout and a pain level of 0 following the procedure. Post Debridement Measurements: 2cm length x 0.5cm width x 2.3cm depth; 1.806cm^3 volume. Character of Wound/Ulcer Post Debridement is stable. Post procedure Diagnosis Wound #4: Same as Pre-Procedure Plan Follow-up Appointments: Return Appointment in 1 week. Bathing/ Shower/ Hygiene: May shower; gently cleanse wound with antibacterial soap, rinse and pat dry prior to dressing wounds Off-Loading: Open toe surgical shoe WOUND #4: - Amputation Site - T oe Wound Laterality: Right Cleanser: Soap and Water 3 x Per Week/30 Days Discharge Instructions: Gently cleanse wound with antibacterial soap, rinse and pat dry prior to dressing wounds Prim Dressing: Silvercel Small 2x2 (in/in) (DME) (Dispense As Written) 3 x Per Week/30 Days ary Discharge Instructions: Apply Silvercel Small 2x2 (in/in) as instructed Secondary Dressing: Gauze (DME) (Generic) 3 x Per Week/30 Days Discharge Instructions: As directed: dry, moistened with saline or moistened with Dakins Solution Secured With: Medipore T - 36M Medipore H Soft Cloth Surgical T ape ape, 2x2 (in/yd) (DME) (Generic) 3 x Per Week/30 Days VERDIS, KOVAL ([id number]) [id number] Page 9 of 9 Secured With: [employer] or Non-Sterile 6-ply 4.5x4 (yd/yd) (DME) (Generic) 3 x Per Week/30 Days Discharge Instructions: Apply Kerlix as directed 1. I would recommend that we have the patient continue with silver alginate dressing coupled with this will also continue with rolled gauze and ABD pad to secure in place. 2. I am also  can recommend that he should continue to monitor for any signs of worsening right now I do not see any obvious infection and even his pathology came back negative for anything going on from the osteomyelitis standpoint so he is not at the point of requiring hyperbarics again which she is happy about he was with this for quite some time previous with his left foot. Nonetheless we will see if we can get this healed as it stands currently. We may look towards doing a skin substitute as well I think that is a strong possibility and in fact could help heal this faster. We will see patient back for reevaluation in 1 week here in the clinic. If anything worsens or changes patient will contact our office for additional recommendations. Electronic Signature(s) Signed: 07/25/2022 9:31:32 AM By: Allen Derry PA-C Entered By: Allen Derry on 07/25/2022 09:31:32 -------------------------------------------------------------------------------- SuperBill Details Patient Name: Date of Service: Raymond [name], Raymond Niemann D. 07/25/2022 Medical Record Number: [id number] Patient Account Number: [id number] Date of Birth/Sex: Treating RN: 31-Jan-1966 (57 y.o. Raymond [name] Primary Care Provider: Martie Round Other Clinician: Referring Provider: Treating Provider/Extender: Valerie Salts Weeks in Treatment: 2 Diagnosis Coding ICD-10 Codes Code Description T81.31XA Disruption of external operation (surgical) wound, not elsewhere classified, initial encounter L97.512 Non-pressure chronic ulcer of other part of right foot with fat layer exposed E11.621 Type 2 diabetes mellitus with foot ulcer I50.42 Chronic combined systolic (congestive) and diastolic (congestive) heart failure J44.9 Chronic obstructive pulmonary disease, unspecified Facility Procedures : CPT4 Code: [id number] Description: 11042 - DEB SUBQ TISSUE 20 SQ CM/< ICD-10 Diagnosis Description L97.512 Non-pressure chronic ulcer of other part of  right foot with fat layer exposed Modifier: Quantity: 1 Physician Procedures : CPT4 Code  Description Modifier [id number] 11042 - WC PHYS SUBQ TISS 20 SQ CM ICD-10 Diagnosis Description L97.512 Non-pressure chronic ulcer of other part of right foot with fat layer exposed Quantity: 1 Electronic Signature(s) Signed: 07/25/2022 9:31:39 AM By: Allen Derry PA-C Entered By: Allen Derry on 07/25/2022 09:31:39

## 2022-07-25 NOTE — Progress Notes (Signed)
DEONE, LEIFHEIT ([id number]) [id number] Page 1 of 9 Visit Report for 07/25/2022 Arrival Information Details Patient Name: Date of Service: Raymond [name] 07/25/2022 8:30 A M Medical Record Number: [id number] Patient Account Number: [id number] Date of Birth/Sex: Treating RN: 06-Jan-1966 (57 y.o. Raymond [name] Primary Care Raymond [name]: Raymond [name] Other Clinician: Referring Maizey Menendez: Treating Aury Scollard/Extender: Gwendlyn Deutscher in Treatment: 2 Visit Information History Since Last Visit Added or deleted any medications: No Patient Arrived: Ambulatory Has Dressing in Place as Prescribed: Yes Arrival Time: 08:36 Pain Present Now: No Accompanied By: self Transfer Assistance: None Patient Identification Verified: Yes Secondary Verification Process Completed: Yes Patient Requires Transmission-Based Precautions: No Patient Has Alerts: Yes Patient Alerts: ABI R 1.37 L1.26 Electronic Signature(s) Signed: 07/25/2022 5:22:58 PM By: Midge Aver MSN RN CNS WTA Entered By: Midge Aver on 07/25/2022 08:36:56 -------------------------------------------------------------------------------- Clinic Level of Care Assessment Details Patient Name: Date of Service: Raymond Blood [name]. 07/25/2022 8:30 A M Medical Record Number: [id number] Patient Account Number: [id number] Date of Birth/Sex: Treating RN: Jul 25, 1965 (57 y.o. Raymond [name] Primary Care Leslye Puccini: Raymond [name] Other Clinician: Referring Kamoni Gentles: Treating Dagoberto Nealy/Extender: Gwendlyn Deutscher in Treatment: 2 Clinic Level of Care Assessment Items TOOL 1 Quantity Score []  - 0 Use when EandM and Procedure is performed on INITIAL visit ASSESSMENTS - Nursing Assessment / Reassessment []  - 0 General Physical Exam (combine w/ comprehensive assessment (listed just below) when performed on new pt. evals) []  - 0 Comprehensive Assessment (HX, ROS, Risk  Assessments, Wounds Hx, etc.) ASSESSMENTS - Wound and Skin Assessment / Reassessment []  - 0 Dermatologic / Skin Assessment (not related to wound area) ASSESSMENTS - Ostomy and/or Continence Assessment and Care LAMARKUS, NEBEL ([id number]) [id number] Page 2 of 9 []  - 0 Incontinence Assessment and Management []  - 0 Ostomy Care Assessment and Management (repouching, etc.) PROCESS - Coordination of Care []  - 0 Simple Patient / Family Education for ongoing care []  - 0 Complex (extensive) Patient / Family Education for ongoing care []  - 0 Staff obtains Chiropractor, Records, T Results / Process Orders est []  - 0 Staff telephones HHA, Nursing Raymond [name] / Clarify orders / etc []  - 0 Routine Transfer to another Facility (non-emergent condition) []  - 0 Routine Hospital Admission (non-emergent condition) []  - 0 New Admissions / Manufacturing engineer / Ordering NPWT Apligraf, etc. , []  - 0 Emergency Hospital Admission (emergent condition) PROCESS - Special Needs []  - 0 Pediatric / Minor Patient Management []  - 0 Isolation Patient Management []  - 0 Hearing / Language / Visual special needs []  - 0 Assessment of Community assistance (transportation, [name]/C planning, etc.) []  - 0 Additional assistance / Altered mentation []  - 0 Support Surface(s) Assessment (bed, cushion, seat, etc.) INTERVENTIONS - Miscellaneous []  - 0 External ear exam []  - 0 Patient Transfer (multiple staff / Nurse, adult / Similar devices) []  - 0 Simple Staple / Suture removal (25 or less) []  - 0 Complex Staple / Suture removal (26 or more) []  - 0 Hypo/Hyperglycemic Management (do not check if billed separately) []  - 0 Ankle / Brachial Index (ABI) - do not check if billed separately Has the patient been seen at the hospital within the last three years: Yes Total Score: 0 Level Of Care: ____ Electronic Signature(s) Signed: 07/25/2022 5:22:58 PM By: Midge Aver MSN RN CNS  WTA Entered By: Midge Aver on 07/25/2022 09:10:24 -------------------------------------------------------------------------------- Encounter Discharge Information Details Patient Name: Date of Service: Raymond [name], Raymond Blalock M [name]. 07/25/2022  8:30 A M Medical Record Number: [id number] Patient Account Number: [id number] Date of Birth/Sex: Treating RN: [birth year] (57 y.o. Raymond [name] Primary Care Oliana Gowens: Raymond [name] Other Clinician: Referring Robbin Loughmiller: Treating Billie Intriago/Extender: Gwendlyn Deutscher in Treatment: 2 Encounter Discharge Information Items Post Procedure Vitals Discharge Condition: Stable Temperature (F): 98.4 Raymond [name], Raymond [name] ([id number]) [id number] Page 3 of 9 Ambulatory Status: Ambulatory Pulse (bpm): 77 Discharge Destination: Home Respiratory Rate (breaths/min): 18 Transportation: Private Auto Blood Pressure (mmHg): 124/63 Accompanied By: self Schedule Follow-up Appointment: Yes Clinical Summary of Care: Electronic Signature(s) Signed: 07/25/2022 9:12:35 AM By: Midge Aver MSN RN CNS WTA Entered By: Midge Aver on 07/25/2022 09:12:35 -------------------------------------------------------------------------------- Lower Extremity Assessment Details Patient Name: Date of Service: Raymond Blood [name]. 07/25/2022 8:30 A M Medical Record Number: [id number] Patient Account Number: [id number] Date of Birth/Sex: Treating RN: 03/18/1965 (57 y.o. Raymond [name] Primary Care Emonnie Cannady: Raymond [name] Other Clinician: Referring Jammi Morrissette: Treating Quillan Whitter/Extender: Valerie Salts Weeks in Treatment: 2 Electronic Signature(s) Signed: 07/25/2022 5:22:58 PM By: Midge Aver MSN RN CNS WTA Entered By: Midge Aver on 07/25/2022 09:25:54 -------------------------------------------------------------------------------- Multi Wound Chart Details Patient Name: Date of Service: Raymond Blood [name].  07/25/2022 8:30 A M Medical Record Number: [id number] Patient Account Number: [id number] Date of Birth/Sex: Treating RN: 19-Jul-1965 (57 y.o. Raymond [name] Primary Care Aldred Mase: Raymond [name] Other Clinician: Referring Sheehan Stacey: Treating Jastin Fore/Extender: Valerie Salts Weeks in Treatment: 2 Vital Signs Height(in): 74 Pulse(bpm): 77 Weight(lbs): 398 Blood Pressure(mmHg): 124/63 Body Mass Index(BMI): 51.1 Temperature(F): 98.4 Respiratory Rate(breaths/min): 18 [4:Photos:] [N/A:N/A] Right Amputation Site - Toe N/A N/A Wound Location: Surgical Injury N/A N/A Wounding Event: Open Surgical Wound N/A N/A Primary Etiology: Congestive Heart Failure, N/A N/A Comorbid History: Hypertension, Type II Diabetes, Neuropathy 06/09/2022 N/A N/A Date Acquired: 2 N/A N/A Weeks of Treatment: Open N/A N/A Wound Status: No N/A N/A Wound Recurrence: 2x0.5x2.2 N/A N/A Measurements L x W x [name] (cm) 0.785 N/A N/A A (cm) : rea 1.728 N/A N/A Volume (cm) : 52.40% N/A N/A % Reduction in A rea: 61.20% N/A N/A % Reduction in Volume: Full Thickness Without Exposed N/A N/A Classification: Support Structures Medium N/A N/A Exudate A mount: Serosanguineous N/A N/A Exudate Type: red, brown N/A N/A Exudate Color: Small (1-33%) N/A N/A Granulation A mount: Red, Pink N/A N/A Granulation Quality: Small (1-33%) N/A N/A Necrotic A mount: Fat Layer (Subcutaneous Tissue): Yes N/A N/A Exposed Structures: Fascia: No Tendon: No Muscle: No Joint: No Bone: No Small (1-33%) N/A N/A Epithelialization: Debridement - Excisional N/A N/A Debridement: Pre-procedure Verification/Time Out 09:08 N/A N/A Taken: Subcutaneous, Slough N/A N/A Tissue Debrided: Skin/Subcutaneous Tissue N/A N/A Level: 0.78 N/A N/A Debridement A (sq cm): rea Curette N/A N/A Instrument: Minimum N/A N/A Bleeding: Pressure N/A N/A Hemostasis A chieved: 0 N/A N/A Procedural Pain: 0 N/A N/A Post  Procedural Pain: Procedure was tolerated well N/A N/A Debridement Treatment Response: 2x0.5x2.3 N/A N/A Post Debridement Measurements L x W x [name] (cm) 1.806 N/A N/A Post Debridement Volume: (cm) Debridement N/A N/A Procedures Performed: Treatment Notes Wound #4 (Amputation Site - Toe) Wound Laterality: Right Cleanser Soap and Water Discharge Instruction: Gently cleanse wound with antibacterial soap, rinse and pat dry prior to dressing wounds Peri-Wound Care Topical Primary Dressing Silvercel Small 2x2 (in/in) Discharge Instruction: Apply Silvercel Small 2x2 (in/in) as instructed Secondary Dressing Gauze Discharge Instruction: As directed: dry, moistened with saline or moistened with Dakins Solution Secured With Medipore T - 56M Medipore H Soft Cloth  Surgical T ape ape, 2x2 (in/yd) Kerlix Roll Sterile or Non-Sterile 6-ply 4.5x4 (yd/yd) Discharge Instruction: Apply Kerlix as directed Compression Wrap Compression Stockings Add-Ons Raymond [name], Raymond [name] ([id number]) [id number] Page 5 of 9 Electronic Signature(s) Signed: 07/25/2022 5:22:58 PM By: Midge Aver MSN RN CNS WTA Previous Signature: 07/25/2022 9:08:30 AM Version By: Midge Aver MSN RN CNS WTA Entered By: Midge Aver on 07/25/2022 09:26:02 -------------------------------------------------------------------------------- Multi-Disciplinary Care Plan Details Patient Name: Date of Service: Raymond [name], Raymond Niemann [name]. 07/25/2022 8:30 A M Medical Record Number: [id number] Patient Account Number: [id number] Date of Birth/Sex: Treating RN: 31-Jul-1965 (57 y.o. Raymond [name] Primary Care Tristian Sickinger: Raymond [name] Other Clinician: Referring Deetta Siegmann: Treating Lamone Ferrelli/Extender: Gwendlyn Deutscher in Treatment: 2 Active Inactive Necrotic Tissue Nursing Diagnoses: Impaired tissue integrity related to necrotic/devitalized tissue Knowledge deficit related to management of  necrotic/devitalized tissue Goals: Necrotic/devitalized tissue will be minimized in the wound bed Date Initiated: 07/08/2022 Target Resolution Date: 08/06/2022 Goal Status: Active Patient/caregiver will verbalize understanding of reason and process for debridement of necrotic tissue Date Initiated: 07/08/2022 Target Resolution Date: 08/06/2022 Goal Status: Active Interventions: Assess patient pain level pre-, during and post procedure and prior to discharge Provide education on necrotic tissue and debridement process Treatment Activities: Excisional debridement : 07/08/2022 Notes: Wound/Skin Impairment Nursing Diagnoses: Impaired tissue integrity Knowledge deficit related to ulceration/compromised skin integrity Goals: Patient/caregiver will verbalize understanding of skin care regimen Date Initiated: 07/08/2022 Target Resolution Date: 08/06/2022 Goal Status: Active Ulcer/skin breakdown will have a volume reduction of 30% by week 4 Date Initiated: 07/08/2022 Target Resolution Date: 08/06/2022 Goal Status: Active Ulcer/skin breakdown will have a volume reduction of 50% by week 8 Date Initiated: 07/08/2022 Target Resolution Date: 09/07/2022 Goal Status: Active Ulcer/skin breakdown will have a volume reduction of 80% by week 12 Date Initiated: 07/08/2022 Target Resolution Date: 10/08/2022 Goal Status: Active Raymond [name], Raymond [name] ([id number]) [phone].pdf Page 6 of 9 Ulcer/skin breakdown will heal within 14 weeks Date Initiated: 07/08/2022 Target Resolution Date: 10/22/2022 Goal Status: Active Interventions: Assess patient/caregiver ability to obtain necessary supplies Assess patient/caregiver ability to perform ulcer/skin care regimen upon admission and as needed Assess ulceration(s) every visit Provide education on ulcer and skin care Screen for HBO Treatment Activities: Referred to DME Kellin Fifer for dressing supplies : 07/08/2022 Skin care regimen initiated :  07/08/2022 Topical wound management initiated : 07/08/2022 Notes: Electronic Signature(s) Signed: 07/25/2022 9:10:46 AM By: Midge Aver MSN RN CNS WTA Entered By: Midge Aver on 07/25/2022 09:10:45 -------------------------------------------------------------------------------- Pain Assessment Details Patient Name: Date of Service: Raymond Blood [name]. 07/25/2022 8:30 A M Medical Record Number: [id number] Patient Account Number: [id number] Date of Birth/Sex: Treating RN: Sep 02, 1965 (57 y.o. Raymond [name] Primary Care Melody Cirrincione: Raymond [name] Other Clinician: Referring Lindsay Soulliere: Treating Lavonta Tillis/Extender: Valerie Salts Weeks in Treatment: 2 Active Problems Location of Pain Severity and Description of Pain Patient Has Paino No Site Locations Pain Management and Medication Current Pain Management: Electronic Signature(s) Signed: 07/25/2022 5:22:58 PM By: Midge Aver MSN RN CNS [address], Raymond [name] ([id number]) [id number] Page 7 of 9 Entered By: Midge Aver on 07/25/2022 08:37:28 -------------------------------------------------------------------------------- Patient/Caregiver Education Details Patient Name: Date of Service: Raymond [name] 6/24/2024andnbsp8:30 A M Medical Record Number: [id number] Patient Account Number: [id number] Date of Birth/Gender: Treating RN: 1965/05/20 (57 y.o. Raymond [name] Primary Care Physician: Raymond [name] Other Clinician: Referring Physician: Treating Physician/Extender: Gwendlyn Deutscher in Treatment: 2 Education Assessment Education Provided To: Patient Education Topics Provided Wound/Skin Impairment: Handouts: Caring for  Your Ulcer Methods: Explain/Verbal Responses: State content correctly Electronic Signature(s) Signed: 07/25/2022 5:22:58 PM By: Midge Aver MSN RN CNS WTA Entered By: Midge Aver on 07/25/2022  09:10:59 -------------------------------------------------------------------------------- Wound Assessment Details Patient Name: Date of Service: Raymond Blood [name]. 07/25/2022 8:30 A M Medical Record Number: [id number] Patient Account Number: [id number] Date of Birth/Sex: Treating RN: 1965-08-19 (57 y.o. Raymond [name] Primary Care Gricelda Foland: Raymond [name] Other Clinician: Referring Kohlton Gilpatrick: Treating Ciro Tashiro/Extender: Valerie Salts Weeks in Treatment: 2 Wound Status Wound Number: 4 Primary Open Surgical Wound Etiology: Wound Location: Right Amputation Site - Toe Wound Status: Open Wounding Event: Surgical Injury Comorbid Congestive Heart Failure, Hypertension, Type II Diabetes, Date Acquired: 06/09/2022 History: Neuropathy Weeks Of Treatment: 2 Clustered Wound: No Photos Raymond [name], Raymond [name] ([id number]) [id number] Page 8 of 9 Wound Measurements Length: (cm) 2 Width: (cm) 0.5 Depth: (cm) 2.2 Area: (cm) 0.785 Volume: (cm) 1.728 % Reduction in Area: 52.4% % Reduction in Volume: 61.2% Epithelialization: Small (1-33%) Wound Description Classification: Full Thickness Without Exposed Support Structures Exudate Amount: Medium Exudate Type: Serosanguineous Exudate Color: red, brown Foul Odor After Cleansing: No Slough/Fibrino Yes Wound Bed Granulation Amount: Small (1-33%) Exposed Structure Granulation Quality: Red, Pink Fascia Exposed: No Necrotic Amount: Small (1-33%) Fat Layer (Subcutaneous Tissue) Exposed: Yes Tendon Exposed: No Muscle Exposed: No Joint Exposed: No Bone Exposed: No Treatment Notes Wound #4 (Amputation Site - Toe) Wound Laterality: Right Cleanser Soap and Water Discharge Instruction: Gently cleanse wound with antibacterial soap, rinse and pat dry prior to dressing wounds Peri-Wound Care Topical Primary Dressing Silvercel Small 2x2 (in/in) Discharge Instruction: Apply Silvercel Small 2x2 (in/in) as  instructed Secondary Dressing Gauze Discharge Instruction: As directed: dry, moistened with saline or moistened with Dakins Solution Secured With Medipore T - 64M Medipore H Soft Cloth Surgical T ape ape, 2x2 (in/yd) Kerlix Roll Sterile or Non-Sterile 6-ply 4.5x4 (yd/yd) Discharge Instruction: Apply Kerlix as directed Compression Wrap Compression Stockings Add-Ons Electronic Signature(s) Signed: 07/25/2022 5:22:58 PM By: Midge Aver MSN RN CNS WTA Entered By: Midge Aver on 07/25/2022 08:43:04 Raymond [name] ([id number]) [id number] Page 9 of 9 -------------------------------------------------------------------------------- Vitals Details Patient Name: Date of Service: Raymond [name]. 07/25/2022 8:30 A M Medical Record Number: [id number] Patient Account Number: [id number] Date of Birth/Sex: Treating RN: [birth year] (57 y.o. Raymond [name] Primary Care Jeanpaul Biehl: Raymond [name] Other Clinician: Referring Casmere Hollenbeck: Treating Cumi Sanagustin/Extender: Valerie Salts Weeks in Treatment: 2 Vital Signs Time Taken: 08:37 Temperature (F): 98.4 Height (in): 74 Pulse (bpm): 77 Weight (lbs): 398 Respiratory Rate (breaths/min): 18 Body Mass Index (BMI): 51.1 Blood Pressure (mmHg): 124/63 Reference Range: 80 - 120 mg / dl Electronic Signature(s) Signed: 07/25/2022 5:22:58 PM By: Midge Aver MSN RN CNS WTA Entered By: Midge Aver on 07/25/2022 08:37:22

## 2022-08-01 ENCOUNTER — Encounter: Payer: Medicare HMO | Attending: Physician Assistant | Admitting: Physician Assistant

## 2022-08-01 DIAGNOSIS — E1151 Type 2 diabetes mellitus with diabetic peripheral angiopathy without gangrene: Secondary | ICD-10-CM | POA: Diagnosis not present

## 2022-08-01 DIAGNOSIS — E669 Obesity, unspecified: Secondary | ICD-10-CM | POA: Insufficient documentation

## 2022-08-01 DIAGNOSIS — E11621 Type 2 diabetes mellitus with foot ulcer: Secondary | ICD-10-CM | POA: Insufficient documentation

## 2022-08-01 DIAGNOSIS — Z89421 Acquired absence of other right toe(s): Secondary | ICD-10-CM | POA: Insufficient documentation

## 2022-08-01 DIAGNOSIS — Z6841 Body Mass Index (BMI) 40.0 and over, adult: Secondary | ICD-10-CM | POA: Diagnosis not present

## 2022-08-01 DIAGNOSIS — I110 Hypertensive heart disease with heart failure: Secondary | ICD-10-CM | POA: Insufficient documentation

## 2022-08-01 DIAGNOSIS — L97512 Non-pressure chronic ulcer of other part of right foot with fat layer exposed: Secondary | ICD-10-CM | POA: Insufficient documentation

## 2022-08-01 DIAGNOSIS — J449 Chronic obstructive pulmonary disease, unspecified: Secondary | ICD-10-CM | POA: Insufficient documentation

## 2022-08-01 DIAGNOSIS — I5042 Chronic combined systolic (congestive) and diastolic (congestive) heart failure: Secondary | ICD-10-CM | POA: Insufficient documentation

## 2022-08-01 DIAGNOSIS — E1140 Type 2 diabetes mellitus with diabetic neuropathy, unspecified: Secondary | ICD-10-CM | POA: Insufficient documentation

## 2022-08-01 NOTE — Progress Notes (Addendum)
EMARI, SIBBETT ([id number]) [id number] Page 1 of 10 Visit Report for 08/01/2022 Chief Complaint Document Details Patient Name: Date of Service: [name]. 08/01/2022 8:30 A M Medical Record Number: [id number] Patient Account Number: [id number] Date of Birth/Sex: Treating RN: 06-Oct-1965 (57 y.o. [name] Primary Care Provider: Martie Round Other Clinician: Referring Provider: Treating Provider/Extender: Gwendlyn Deutscher in Treatment: 3 Information Obtained from: Patient Chief Complaint Left foot ulcer from surgical dehiscence following 2nd toe amputation right foot Electronic Signature(s) Signed: 08/01/2022 9:08:55 AM By: Allen Derry PA-C Entered By: Allen Derry on 08/01/2022 06:08:55 -------------------------------------------------------------------------------- Debridement Details Patient Name: Date of Service: [name] 08/01/2022 8:30 A M Medical Record Number: [id number] Patient Account Number: [id number] Date of Birth/Sex: Treating RN: [birth year] (57 y.o. [name] Primary Care Provider: Martie Round Other Clinician: Referring Provider: Treating Provider/Extender: Gwendlyn Deutscher in Treatment: 3 Debridement Performed for Assessment: Wound #4 Right Amputation Site - Toe Performed By: Physician Allen Derry, PA-C Debridement Type: Debridement Level of Consciousness (Pre-procedure): Awake and Alert Pre-procedure Verification/Time Out Yes - 09:11 Taken: Start Time: 09:11 Pain Control: Lidocaine 4% T opical Solution Percent of Wound Bed Debrided: 100% T Area Debrided (cm): otal 0.39 Tissue and other material debrided: Viable, Non-Viable, Slough, Subcutaneous, Slough Level: Skin/Subcutaneous Tissue Debridement Description: Excisional Instrument: Curette Bleeding: Minimum Hemostasis Achieved: Pressure Procedural Pain: 0 Post Procedural Pain: 0 Response to  Treatment: Procedure was tolerated well Lucia Gaskins ([id number]) [id number] Page 2 of 10 Level of Consciousness (Post- Awake and Alert procedure): Post Debridement Measurements of Total Wound Length: (cm) 1 Width: (cm) 0.5 Depth: (cm) 2.5 Volume: (cm) 0.982 Character of Wound/Ulcer Post Debridement: Stable Post Procedure Diagnosis Same as Pre-procedure Electronic Signature(s) Signed: 08/01/2022 3:10:51 PM By: Allen Derry PA-C Signed: 09/30/2022 2:44:30 PM By: Midge Aver MSN RN CNS WTA Entered By: Midge Aver on 08/01/2022 06:12:31 -------------------------------------------------------------------------------- HPI Details Patient Name: Date of Service: [name], [name] 08/01/2022 8:30 A M Medical Record Number: [id number] Patient Account Number: [id number] Date of Birth/Sex: Treating RN: 04-04-1965 (57 y.o. [name] Primary Care Provider: Martie Round Other Clinician: Referring Provider: Treating Provider/Extender: Valerie Salts Weeks in Treatment: 3 History of Present Illness HPI Description: 09/05/17-He is seeing an initial evaluation for a left plantar foot ulcer. He has a remote history of left great toe amputation. He states that 4-6 weeks ago he noted callus formation and ulceration. He has not seen primary care regarding this. He is not currently on antibiotic therapy. He does not routinely follow with podiatry. He states diabetic foot wear will arrive early next week. The EHR shows an A1c of 9% approximate 4 months ago but he states he had one and primary care a few weeks ago but does not know the results. He is neuropathic and does not complain of any pain, he is currently wearing crocs. 09/12/17-he is here in follow up evaluation for left plantar foot ulcer. There is improvement in both appearance and measurement. We will continue with same treatment plan and he will follow up next week. 09/19/17 on  evaluation today patient actually appears to be doing excellent in regard to his ulcer on the invitation site of his left great foot plantar aspect. He's been tolerating the dressing changes without complication. In fact with the Prisma and the current measures he has been shown signs of excellent improvement week by week up to this point. We have been to breeding the  wound in this seems to have been of great benefit for him. Fortunately there is no evidence of infection. 09/26/17 on evaluation today patient appears to be doing rather well in regard to his wound. He did not know quite as much improvement this week as compared to last week. Nonetheless he still continues to show signs of improving to some degree. I do believe he may benefit from an offloading shoe. No fevers, chills, nausea, or vomiting noted at this time. 10/03/17 on evaluation today patient actually appears to be doing much better in regard to the amputation site plantar foot ulcer. Overall this appears significantly smaller even compared to previous. He's been tolerating the dressing changes without complication. He did get his diabetic shoes and I did have a look at them they appear to be fairly good. Nonetheless I do think that for the time being I would probably recommend he continue with the offloading shoe that we have been utilizing just due to the fact that with the dressing I don't know that his diabetic shoes are going to work as appropriately as far as not called an additional pressure and irritation to the area in question. He understands. 10/10/17 on evaluation today patient actually appears to be doing very well in regard to his plantar foot ulcer. He has been tolerating the dressing changes without complication. I do feel like he's making signs of good improvement and in fact of the wound bed appears to be better although it may not be significantly changed in size it appears healthier and I do believe is showing signs of  improving. Nonetheless we gonna keep working towards healing as far as that is concerned. There is no evidence of infection. 10/17/17 on evaluation today patient actually appears to be doing poorly in regard to his plantar foot ulcer. Unfortunately other than just the area where the wound is itself there appears to be a blister that communicates with the wound unfortunately. This is more lateral to the wound itself and also to the distal point of the amputation site. There does not appear to be any evidence of cellulitis spreading at the foot but I do believe some of the drainage from the site itself is. When in nature. Nonetheless this blister area I think needs to be removed in order to allow for appropriate healing of the ulcer itself I think that is gonna be difficult for it to heal otherwise. This was discussed with the patient today. 10/24/17 on evaluation today patient actually appears to be doing better compared to last week even post debridement. He has shown signs of improvement he did test positive for Staphylococcus aureus. With that being said he notes that he's not have any discomfort and in general he does feel like things seem to been doing better. Fortunately there's no additional blistering and no evidence of remaining infection at this time. No fevers, chills, nausea, or vomiting noted HEIDI, VICENCIO ([id number]) [id number] Page 3 of 10 at this time. He has three days of antibiotic left. 10/31/17 on evaluation today patient actually appears to be doing very well in regard to his ulcer on the left foot. He has been tolerating the dressing changes without complication. With that being said fortunately there appears to be no evidence of infection I do think he's made progress compared to previous. No fevers, chills, nausea, or vomiting noted at this time. 11/14/17 on evaluation today patient actually appears to be doing excellent in regard to his  amputation site ulcer on  his foot. In fact this appears to be completely healed at this point. There is no evidence of infection nor underline abscess and I did thoroughly evaluate the periwound location. Overall I'm very pleased with how things appear. Readmission: 01-03-2022 upon evaluation today patient appears for reevaluation here in the clinic although it has been since 2019 that I last saw him. This again has been over 4 years. With that being said he is having an issue with a wound on his foot unfortunately. He has not had any recent x-rays he tells me at this point which is unfortunate as avoid definitely can need to get something started that can be one of the primary items to get moving forward with. With that being said I also think were probably can obtain a wound culture he is probably going to require some antibiotics at some point. I just want to make sure that we have him on the right thing when we do this. Currently the patient tells me that his wounds have been present in regard to the met head for about a month at this point. With that being said he also has a wound on the third toe which is the next toe this actually still present. This unfortunately has a lot of callus buildup around it but I think it is larger underneath than what it would appear on initial inspection. Patient does have a history of diabetes mellitus type 2, congestive heart failure, and COPD. 01-11-2022 upon evaluation patient's wounds actually are showing signs of doing about the same may be just slightly cleaner but definitely not where we want things to be as of yet. Fortunately there does not appear to be any signs of active infection locally nor systemically at this point which is great news. No fevers, chills, nausea, vomiting, or diarrhea. Of note patient has had his MRI but we do not have the results of that as of yet we are still waiting for the reading on this at this point. 01-18-2022 upon evaluation  today patient presents after having had his MRI finally complete. 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 The third and fourth digits seem to be early which is good news. Nonetheless we are still going to need to try to see about getting the infection under control he is already been on the antibiotic therapy which I think has done quite well. In fact I feel like he is showing signs of improvement already. 12/28; patient with a wound on the medial aspect of the left first metatarsal head and an area on the left third toe with slight hammer deformity. He has had a previous left first toe amputation. We are using Iodoflex on the wound. He is a diabetic a recent MRI showed osteomyelitis we have been giving him Cipro and Doxy which he appears to be tolerating well. 02-10-2022 upon evaluation today patient unfortunately has issues still with open wounds of his foot on the left. He does have known osteomyelitis at this point and that is something that we have been treating with oral antibiotics. I actually have not seen him personally since 19 December. In that time he has not had any debridement from that point until today based on what I can see as best I can tell anyway. Has been on Cipro as well as doxycycline. Nonetheless he does still seem to have open wounds today and I think that he would benefit from  hyperbaric oxygen therapy. I discussed that with him today as well. 02-18-2022 upon evaluation today patient appears to be doing about the same in regard to his wounds. Fortunately I do not see any signs of infection although he still has areas that seem to initially close down but that he has a lot of callus that still open at both locations most on the metatarsal region as well as the toe. I think that he is appropriate for proceeding with the hyperbarics and I think the sooner we get this done to try to get these wounds healed the  better off he will be. 02-25-2022 upon evaluation today patient's wounds do show signs of being dry get again. We were attempting to pack and some of the alginate dressing instead of doing the collagen as everything was getting very dry but nonetheless this is still continue to be an ongoing issue. My suggestion based on what I am seeing is good to be that we actually switch to doing Xeroform which should hopefully keep this a little bit more moist and from hopefully closing up prematurely. The patient voiced understanding. 03-03-2022 upon evaluation today patient appears to be doing well with regard to his wounds things as before appear to be getting dry. We are using Xeroform to try to keep it open is much as possible but each time I see him he does tend to cover over the good news is each time he also seems to be getting a little bit smaller which is excellent. Fortunately I do not see any evidence of active infection locally nor systemically which is great news. I think this means that between the antibiotics and the hyperbarics were really on a good track here. 03-10-2022 upon evaluation today patient appears to be doing well currently in regard to his wounds. He is actually showing signs of some good improvement he is also tolerating HBO therapy very well. I do not see any evidence of active infection locally nor systemically which is great news and overall I am extremely pleased in that regard. No fevers, chills, nausea, vomiting, or diarrhea. 03-17-2022 upon evaluation today patient appears to be doing well currently in regard to his wounds in fact the toe is completely healed. The metatarsal head location though not completely healed appears to be doing much better which is great news and overall I am extremely pleased with where we stand today. 03-24-2022 upon evaluation today patient appears to be doing well currently in regard to his wounds. He has been tolerating the dressing changes  without complication. Fortunately there does not appear to be any signs of active infection locally nor systemically in fact I am not so sure this wound is very close to being healed. I did perform some debridement to clearway some of the callus and the patient tolerated this today without complication. Postdebridement the wound bed is significantly improved. 03-31-2022 upon evaluation today patient appears to be doing well currently in regard to his wound which is actually showing signs of being completely healed. This is great news. Fortunately I do believe that the patient is doing much better he still on the oral antibiotics which I think is helping to treat the osteomyelitis is also undergoing hyperbaric oxygen therapy which I think is doing a really good job here as well. 04-21-2022 upon evaluation today patient continues to remain completely closed. He seems to be doing excellent and is very close to completion of his 40 treatments of HBO therapy which that is the  point where recommend discontinue therapy at that point. Fortunately I do not see any evidence of active infection locally nor systemically at this time which is great news. Readmission: 07-08-2022 unfortunately Mr. Costas since I last saw him ended up having an issue with osteomyelitis of his great toe right side which included having to have amputation. This amputation was performed actually on 06-08-2022 and was again amputated secondary to the osteomyelitis. With that being said following however this has dehisced and therefore the patient was referred to [place] for further evaluation and treatment of the dehisced surgical wound at the amputation site of the right great toe. His past medical history really has not changed significantly since I last saw him in the first of the year we will be doing hyperbarics for the left foot we are able to get all this healed that still is doing well according to what the patient tells me at this  point. 07-15-2022 upon evaluation today patient appears to be doing well currently in regard to his wound. This in fact is showing signs of good improvement in my opinion. Fortunately I do not see any evidence of active infection locally nor systemically which is great news. No fevers, chills, nausea, vomiting, or diarrhea. 07-25-2022 upon evaluation today patient appears to be doing well currently in regard to his foot ulcer is actually measuring smaller and looking better I am FLOYED, TORGERSON ([id number]) [id number] Page 4 of 10 actually pleased in that regard. We did get the results back from his pathology that showed necrotic bone but nothing that appears to be actually open at this point. Fortunately I do not see any signs of active infection at this time. 08-01-22 upon evaluation today patient appears to be doing well currently in regard to his wound which is showing signs of improvement I think we may want to switch over to collagen however. Electronic Signature(s) Signed: 08/01/2022 9:13:32 AM By: Allen Derry PA-C Entered By: Allen Derry on 08/01/2022 06:13:32 -------------------------------------------------------------------------------- Physical Exam Details Patient Name: Date of Service: [name] 08/01/2022 8:30 A M Medical Record Number: [id number] Patient Account Number: [id number] Date of Birth/Sex: Treating RN: December 22, 1965 (57 y.o. [name] Primary Care Provider: Martie Round Other Clinician: Referring Provider: Treating Provider/Extender: Valerie Salts Weeks in Treatment: 3 Constitutional Obese and well-hydrated in no acute distress. Respiratory normal breathing without difficulty. Psychiatric this patient is able to make decisions and demonstrates good insight into disease process. Alert and Oriented x 3. pleasant and cooperative. Notes Upon inspection patient's wound bed actually showed signs of good  granulation epithelization at this point. Fortunately I do not see any signs of active infection locally or systemically which is great news in general I do believe that we are making good headway towards closure I do believe switching to collagen would likely be a good option here to try to get this area to feeling. Electronic Signature(s) Signed: 08/01/2022 9:13:54 AM By: Allen Derry PA-C Entered By: Allen Derry on 08/01/2022 06:13:53 -------------------------------------------------------------------------------- Physician Orders Details Patient Name: Date of Service: [name] 08/01/2022 8:30 A M Medical Record Number: [id number] Patient Account Number: [id number] Date of Birth/Sex: Treating RN: 29-Jun-1965 (57 y.o. [name] Primary Care Provider: Martie Round Other Clinician: Referring Provider: Treating Provider/Extender: Gwendlyn Deutscher in Treatment: 3 Verbal / Phone Orders: No SIGIFREDO, LOCICERO ([id number]) [id number] Page 5 of 10 Diagnosis Coding ICD-10 Coding Code Description T81.31XA Disruption of external operation (surgical) wound,  not elsewhere classified, initial encounter L97.512 Non-pressure chronic ulcer of other part of right foot with fat layer exposed E11.621 Type 2 diabetes mellitus with foot ulcer I50.42 Chronic combined systolic (congestive) and diastolic (congestive) heart failure J44.9 Chronic obstructive pulmonary disease, unspecified Follow-up Appointments Return Appointment in 1 week. Bathing/ Shower/ Hygiene May shower; gently cleanse wound with antibacterial soap, rinse and pat dry prior to dressing wounds Off-Loading Open toe surgical shoe Wound Treatment Wound #4 - Amputation Site - Toe Wound Laterality: Right Cleanser: Soap and Water 3 x Per Week/30 Days Discharge Instructions: Gently cleanse wound with antibacterial soap, rinse and pat dry prior to dressing wounds Prim  Dressing: Prisma 4.34 (in) 3 x Per Week/30 Days ary Discharge Instructions: Moisten w/normal saline or sterile water; Cover wound as directed. Do not remove from wound bed. Secondary Dressing: Gauze (Generic) 3 x Per Week/30 Days Discharge Instructions: As directed: dry, moistened with saline or moistened with Dakins Solution Secured With: Medipore T - 52M Medipore H Soft Cloth Surgical T ape ape, 2x2 (in/yd) (Generic) 3 x Per Week/30 Days Secured With: [employer] or Non-Sterile 6-ply 4.5x4 (yd/yd) (Generic) 3 x Per Week/30 Days Discharge Instructions: Apply Kerlix as directed Electronic Signature(s) Signed: 08/01/2022 3:10:51 PM By: Allen Derry PA-C Signed: 09/30/2022 2:44:30 PM By: Midge Aver MSN RN CNS WTA Entered By: Midge Aver on 08/01/2022 06:12:57 -------------------------------------------------------------------------------- Problem List Details Patient Name: Date of Service: [name] 08/01/2022 8:30 A M Medical Record Number: [id number] Patient Account Number: [id number] Date of Birth/Sex: Treating RN: Feb 19, 1965 (57 y.o. [name] Primary Care Provider: Martie Round Other Clinician: Referring Provider: Treating Provider/Extender: Valerie Salts Weeks in Treatment: 3 Active Problems ICD-10 Encounter Code Description Active Date MDM Diagnosis T81.31XA Disruption of external operation (surgical) wound, not elsewhere classified, 07/08/2022 No Yes initial encounter DELAINE, SAUBER ([id number]) [id number] Page 6 of 10 [phone] Non-pressure chronic ulcer of other part of right foot with fat layer exposed 07/08/2022 No Yes E11.621 Type 2 diabetes mellitus with foot ulcer 07/08/2022 No Yes I50.42 Chronic combined systolic (congestive) and diastolic (congestive) heart failure 07/08/2022 No Yes J44.9 Chronic obstructive pulmonary disease, unspecified 07/08/2022 No Yes Inactive Problems Resolved  Problems Electronic Signature(s) Signed: 08/01/2022 9:08:48 AM By: Allen Derry PA-C Entered By: Allen Derry on 08/01/2022 06:08:47 -------------------------------------------------------------------------------- Progress Note Details Patient Name: Date of Service: [name] 08/01/2022 8:30 A M Medical Record Number: [id number] Patient Account Number: [id number] Date of Birth/Sex: Treating RN: 07/16/1965 (57 y.o. [name] Primary Care Provider: Martie Round Other Clinician: Referring Provider: Treating Provider/Extender: Gwendlyn Deutscher in Treatment: 3 Subjective Chief Complaint Information obtained from Patient Left foot ulcer from surgical dehiscence following 2nd toe amputation right foot History of Present Illness (HPI) 09/05/17-He is seeing an initial evaluation for a left plantar foot ulcer. He has a remote history of left great toe amputation. He states that 4-6 weeks ago he noted callus formation and ulceration. He has not seen primary care regarding this. He is not currently on antibiotic therapy. He does not routinely follow with podiatry. He states diabetic foot wear will arrive early next week. The EHR shows an A1c of 9% approximate 4 months ago but he states he had one and primary care a few weeks ago but does not know the results. He is neuropathic and does not complain of any pain, he is currently wearing crocs. 09/12/17-he is here in follow up evaluation for left plantar foot ulcer.  There is improvement in both appearance and measurement. We will continue with same treatment plan and he will follow up next week. 09/19/17 on evaluation today patient actually appears to be doing excellent in regard to his ulcer on the invitation site of his left great foot plantar aspect. He's been tolerating the dressing changes without complication. In fact with the Prisma and the current measures he has been shown signs of excellent improvement week by  week up to this point. We have been to breeding the wound in this seems to have been of great benefit for him. Fortunately there is no evidence of infection. 09/26/17 on evaluation today patient appears to be doing rather well in regard to his wound. He did not know quite as much improvement this week as compared to last week. Nonetheless he still continues to show signs of improving to some degree. I do believe he may benefit from an offloading shoe. No fevers, chills, nausea, or vomiting noted at this time. 10/03/17 on evaluation today patient actually appears to be doing much better in regard to the amputation site plantar foot ulcer. Overall this appears significantly smaller even compared to previous. He's been tolerating the dressing changes without complication. He did get his diabetic shoes and I did have a look at them they appear to be fairly good. Nonetheless I do think that for the time being I would probably recommend he continue with the offloading shoe that we have been utilizing just due to the fact that with the dressing I don't know that his diabetic shoes are going to work as appropriately as far as not called an additional pressure and irritation to the area in question. He understands. 10/10/17 on evaluation today patient actually appears to be doing very well in regard to his plantar foot ulcer. He has been tolerating the dressing changes AMARA, BILLEY ([id number]) [id number] Page 7 of 10 without complication. I do feel like he's making signs of good improvement and in fact of the wound bed appears to be better although it may not be significantly changed in size it appears healthier and I do believe is showing signs of improving. Nonetheless we gonna keep working towards healing as far as that is concerned. There is no evidence of infection. 10/17/17 on evaluation today patient actually appears to be doing poorly in regard to his plantar foot  ulcer. Unfortunately other than just the area where the wound is itself there appears to be a blister that communicates with the wound unfortunately. This is more lateral to the wound itself and also to the distal point of the amputation site. There does not appear to be any evidence of cellulitis spreading at the foot but I do believe some of the drainage from the site itself is. When in nature. Nonetheless this blister area I think needs to be removed in order to allow for appropriate healing of the ulcer itself I think that is gonna be difficult for it to heal otherwise. This was discussed with the patient today. 10/24/17 on evaluation today patient actually appears to be doing better compared to last week even post debridement. He has shown signs of improvement he did test positive for Staphylococcus aureus. With that being said he notes that he's not have any discomfort and in general he does feel like things seem to been doing better. Fortunately there's no additional blistering and no evidence of remaining infection at this time. No fevers, chills, nausea, or vomiting noted at this time. He  has three days of antibiotic left. 10/31/17 on evaluation today patient actually appears to be doing very well in regard to his ulcer on the left foot. He has been tolerating the dressing changes without complication. With that being said fortunately there appears to be no evidence of infection I do think he's made progress compared to previous. No fevers, chills, nausea, or vomiting noted at this time. 11/14/17 on evaluation today patient actually appears to be doing excellent in regard to his amputation site ulcer on his foot. In fact this appears to be completely healed at this point. There is no evidence of infection nor underline abscess and I did thoroughly evaluate the periwound location. Overall I'm very pleased with how things appear. Readmission: 01-03-2022 upon evaluation today patient appears for  reevaluation here in the clinic although it has been since 2019 that I last saw him. This again has been over 4 years. With that being said he is having an issue with a wound on his foot unfortunately. He has not had any recent x-rays he tells me at this point which is unfortunate as avoid definitely can need to get something started that can be one of the primary items to get moving forward with. With that being said I also think were probably can obtain a wound culture he is probably going to require some antibiotics at some point. I just want to make sure that we have him on the right thing when we do this. Currently the patient tells me that his wounds have been present in regard to the met head for about a month at this point. With that being said he also has a wound on the third toe which is the next toe this actually still present. This unfortunately has a lot of callus buildup around it but I think it is larger underneath than what it would appear on initial inspection. Patient does have a history of diabetes mellitus type 2, congestive heart failure, and COPD. 01-11-2022 upon evaluation patient's wounds actually are showing signs of doing about the same may be just slightly cleaner but definitely not where we want things to be as of yet. Fortunately there does not appear to be any signs of active infection locally nor systemically at this point which is great news. No fevers, chills, nausea, vomiting, or diarrhea. Of note patient has had his MRI but we do not have the results of that as of yet we are still waiting for the reading on this at this point. 01-18-2022 upon evaluation today patient presents after having had his MRI finally complete. 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 The third and fourth digits seem to be early which is good news. Nonetheless we are still going to need to try to see about getting the  infection under control he is already been on the antibiotic therapy which I think has done quite well. In fact I feel like he is showing signs of improvement already. 12/28; patient with a wound on the medial aspect of the left first metatarsal head and an area on the left third toe with slight hammer deformity. He has had a previous left first toe amputation. We are using Iodoflex on the wound. He is a diabetic a recent MRI showed osteomyelitis we have been giving him Cipro and Doxy which he appears to be tolerating well. 02-10-2022 upon evaluation today patient unfortunately has issues still with open wounds  of his foot on the left. He does have known osteomyelitis at this point and that is something that we have been treating with oral antibiotics. I actually have not seen him personally since 19 December. In that time he has not had any debridement from that point until today based on what I can see as best I can tell anyway. Has been on Cipro as well as doxycycline. Nonetheless he does still seem to have open wounds today and I think that he would benefit from hyperbaric oxygen therapy. I discussed that with him today as well. 02-18-2022 upon evaluation today patient appears to be doing about the same in regard to his wounds. Fortunately I do not see any signs of infection although he still has areas that seem to initially close down but that he has a lot of callus that still open at both locations most on the metatarsal region as well as the toe. I think that he is appropriate for proceeding with the hyperbarics and I think the sooner we get this done to try to get these wounds healed the better off he will be. 02-25-2022 upon evaluation today patient's wounds do show signs of being dry get again. We were attempting to pack and some of the alginate dressing instead of doing the collagen as everything was getting very dry but nonetheless this is still continue to be an ongoing issue. My suggestion  based on what I am seeing is good to be that we actually switch to doing Xeroform which should hopefully keep this a little bit more moist and from hopefully closing up prematurely. The patient voiced understanding. 03-03-2022 upon evaluation today patient appears to be doing well with regard to his wounds things as before appear to be getting dry. We are using Xeroform to try to keep it open is much as possible but each time I see him he does tend to cover over the good news is each time he also seems to be getting a little bit smaller which is excellent. Fortunately I do not see any evidence of active infection locally nor systemically which is great news. I think this means that between the antibiotics and the hyperbarics were really on a good track here. 03-10-2022 upon evaluation today patient appears to be doing well currently in regard to his wounds. He is actually showing signs of some good improvement he is also tolerating HBO therapy very well. I do not see any evidence of active infection locally nor systemically which is great news and overall I am extremely pleased in that regard. No fevers, chills, nausea, vomiting, or diarrhea. 03-17-2022 upon evaluation today patient appears to be doing well currently in regard to his wounds in fact the toe is completely healed. The metatarsal head location though not completely healed appears to be doing much better which is great news and overall I am extremely pleased with where we stand today. 03-24-2022 upon evaluation today patient appears to be doing well currently in regard to his wounds. He has been tolerating the dressing changes without complication. Fortunately there does not appear to be any signs of active infection locally nor systemically in fact I am not so sure this wound is very close to being healed. I did perform some debridement to clearway some of the callus and the patient tolerated this today without complication. Postdebridement  the wound bed is significantly improved. 03-31-2022 upon evaluation today patient appears to be doing well currently in regard to his wound which is  actually showing signs of being completely healed. This is great news. Fortunately I do believe that the patient is doing much better he still on the oral antibiotics which I think is helping to treat the osteomyelitis is also undergoing hyperbaric oxygen therapy which I think is doing a really good job here as well. 04-21-2022 upon evaluation today patient continues to remain completely closed. He seems to be doing excellent and is very close to completion of his 40 treatments of HBO therapy which that is the point where recommend discontinue therapy at that point. Fortunately I do not see any evidence of active infection locally nor systemically at this time which is great news. Readmission: OLAV, ZAWADA ([id number]) [id number] Page 8 of 10 07-08-2022 unfortunately Mr. Diegel since I last saw him ended up having an issue with osteomyelitis of his great toe right side which included having to have amputation. This amputation was performed actually on 06-08-2022 and was again amputated secondary to the osteomyelitis. With that being said following however this has dehisced and therefore the patient was referred to [place] for further evaluation and treatment of the dehisced surgical wound at the amputation site of the right great toe. His past medical history really has not changed significantly since I last saw him in the first of the year we will be doing hyperbarics for the left foot we are able to get all this healed that still is doing well according to what the patient tells me at this point. 07-15-2022 upon evaluation today patient appears to be doing well currently in regard to his wound. This in fact is showing signs of good improvement in my opinion. Fortunately I do not see any evidence of active infection  locally nor systemically which is great news. No fevers, chills, nausea, vomiting, or diarrhea. 07-25-2022 upon evaluation today patient appears to be doing well currently in regard to his foot ulcer is actually measuring smaller and looking better I am actually pleased in that regard. We did get the results back from his pathology that showed necrotic bone but nothing that appears to be actually open at this point. Fortunately I do not see any signs of active infection at this time. 08-01-22 upon evaluation today patient appears to be doing well currently in regard to his wound which is showing signs of improvement I think we may want to switch over to collagen however. Objective Constitutional Obese and well-hydrated in no acute distress. Vitals Time Taken: 8:50 AM, Height: 74 in, Weight: 398 lbs, BMI: 51.1, Temperature: 98.3 F, Pulse: 75 bpm, Respiratory Rate: 18 breaths/min, Blood Pressure: 135/68 mmHg. Respiratory normal breathing without difficulty. Psychiatric this patient is able to make decisions and demonstrates good insight into disease process. Alert and Oriented x 3. pleasant and cooperative. General Notes: Upon inspection patient's wound bed actually showed signs of good granulation epithelization at this point. Fortunately I do not see any signs of active infection locally or systemically which is great news in general I do believe that we are making good headway towards closure I do believe switching to collagen would likely be a good option here to try to get this area to feeling. Integumentary (Hair, Skin) Wound #4 status is Open. Original cause of wound was Surgical Injury. The date acquired was: 06/09/2022. The wound has been in treatment 3 weeks. The wound is located on the Right Amputation Site - T The wound measures 1cm length x 0.5cm width x 2.5cm depth; 0.393cm^2 area and 0.982cm^3 volume. There is  oe. Fat Layer (Subcutaneous Tissue) exposed. There is a medium amount of  serosanguineous drainage noted. There is small (1-33%) red, pink granulation within the wound bed. There is a small (1-33%) amount of necrotic tissue within the wound bed. Assessment Active Problems ICD-10 Disruption of external operation (surgical) wound, not elsewhere classified, initial encounter Non-pressure chronic ulcer of other part of right foot with fat layer exposed Type 2 diabetes mellitus with foot ulcer Chronic combined systolic (congestive) and diastolic (congestive) heart failure Chronic obstructive pulmonary disease, unspecified Procedures Wound #4 Pre-procedure diagnosis of Wound #4 is an Open Surgical Wound located on the Right Amputation Site - T . There was a Excisional Skin/Subcutaneous oe Tissue Debridement with a total area of 0.39 sq cm performed by Allen Derry, PA-C. With the following instrument(s): Curette to remove Viable and Non- Viable tissue/material. Material removed includes Subcutaneous Tissue and Slough and after achieving pain control using Lidocaine 4% T opical Solution. No specimens were taken. A time out was conducted at 09:11, prior to the start of the procedure. A Minimum amount of bleeding was controlled with Pressure. The procedure was tolerated well with a pain level of 0 throughout and a pain level of 0 following the procedure. Post Debridement Measurements: 1cm length x 0.5cm width x 2.5cm depth; 0.982cm^3 volume. Character of Wound/Ulcer Post Debridement is stable. Post procedure Diagnosis Wound #4: Same as Pre-Procedure AMBROS, HAFELE ([id number]) [id number] Page 9 of 10 Plan Follow-up Appointments: Return Appointment in 1 week. Bathing/ Shower/ Hygiene: May shower; gently cleanse wound with antibacterial soap, rinse and pat dry prior to dressing wounds Off-Loading: Open toe surgical shoe WOUND #4: - Amputation Site - T oe Wound Laterality: Right Cleanser: Soap and Water 3 x Per Week/30 Days Discharge  Instructions: Gently cleanse wound with antibacterial soap, rinse and pat dry prior to dressing wounds Prim Dressing: Prisma 4.34 (in) 3 x Per Week/30 Days ary Discharge Instructions: Moisten w/normal saline or sterile water; Cover wound as directed. Do not remove from wound bed. Secondary Dressing: Gauze (Generic) 3 x Per Week/30 Days Discharge Instructions: As directed: dry, moistened with saline or moistened with Dakins Solution Secured With: Medipore T - 91M Medipore H Soft Cloth Surgical T ape ape, 2x2 (in/yd) (Generic) 3 x Per Week/30 Days Secured With: [employer] Sterile or Non-Sterile 6-ply 4.5x4 (yd/yd) (Generic) 3 x Per Week/30 Days Discharge Instructions: Apply Kerlix as directed 1. Based on what I am seeing I am going to recommend that we have the patient continue to monitor for any signs of infection or worsening. Based on what I am seeing I do believe that we are making good headway towards complete closure. 2. I am going to recommend that he switch over to a collagen dressing we are going to pack this and dry I do not think he needs to have the collagen moistened as it tends to drain enough on its own with some help with Some of the excessive drainage. We will see patient back for reevaluation in 1 week here in the clinic. If anything worsens or changes patient will contact our office for additional recommendations. Electronic Signature(s) Signed: 08/01/2022 9:15:44 AM By: Allen Derry PA-C Entered By: Allen Derry on 08/01/2022 06:15:43 -------------------------------------------------------------------------------- SuperBill Details Patient Name: Date of Service: [name], [name] 08/01/2022 Medical Record Number: [id number] Patient Account Number: [id number] Date of Birth/Sex: Treating RN: February 19, 1965 (57 y.o. [name] Primary Care Provider: Martie Round Other Clinician: Referring Provider: Treating Provider/Extender: Gwendlyn Deutscher  in  Treatment: 3 Diagnosis Coding ICD-10 Codes Code Description T81.31XA Disruption of external operation (surgical) wound, not elsewhere classified, initial encounter L97.512 Non-pressure chronic ulcer of other part of right foot with fat layer exposed E11.621 Type 2 diabetes mellitus with foot ulcer I50.42 Chronic combined systolic (congestive) and diastolic (congestive) heart failure J44.9 Chronic obstructive pulmonary disease, unspecified Facility Procedures : CPT4 Code: [id number] Description: 11042 - DEB SUBQ TISSUE 20 SQ CM/< ICD-10 Diagnosis Description L97.512 Non-pressure chronic ulcer of other part of right foot with fat layer exposed Modifier: Quantity: 1 Physician Procedures KENETH, ETCHELLS ([id number]): CPT4 Code Description [id number] 11042 - WC PHYS SUBQ TISS 20 SQ CM ICD-10 Diagnosis Description L97.512 Non-pressure chronic ulcer of other part of right foot with fa [id number] Page 10 of 10: Quantity Modifier 1 t layer exposed Electronic Signature(s) Signed: 08/01/2022 9:15:52 AM By: Allen Derry PA-C Entered By: Allen Derry on 08/01/2022 06:15:52

## 2022-08-08 ENCOUNTER — Encounter: Payer: Medicare HMO | Admitting: Physician Assistant

## 2022-08-08 DIAGNOSIS — E11621 Type 2 diabetes mellitus with foot ulcer: Secondary | ICD-10-CM | POA: Diagnosis not present

## 2022-08-08 NOTE — Progress Notes (Signed)
Raymond, [name] ([id number]) [id number] Page 1 of 9 Visit Report for 08/08/2022 Arrival Information Details Patient Name: Date of Service: Raymond [name] 08/08/2022 9:45 A M Medical Record Number: [id number] Patient Account Number: [id number] Date of Birth/Sex: Treating RN: July 06, 1965 (57 y.o. Raymond [name] Primary Care Chirstine Defrain: Martie Round Other Clinician: Referring Lavan Imes: Treating Zaneta Lightcap/Extender: Gwendlyn Deutscher in Treatment: 4 Visit Information History Since Last Visit Added or deleted any medications: No Patient Arrived: Ambulatory Any new allergies or adverse reactions: No Arrival Time: 09:59 Had a fall or experienced change in No Accompanied By: self activities of daily living that may affect Transfer Assistance: None risk of falls: Patient Identification Verified: Yes Hospitalized since last visit: No Secondary Verification Process Completed: Yes Has Dressing in Place as Prescribed: Yes Patient Requires Transmission-Based Precautions: No Has Footwear/Offloading in Place as Yes Patient Has Alerts: Yes Prescribed: Patient Alerts: ABI R 1.37 L1.26 Left: Surgical Shoe with Pressure Relief Insole Right: Surgical Shoe with Pressure Relief Insole Pain Present Now: No Electronic Signature(s) Signed: 08/08/2022 3:32:36 PM By: Angelina Pih Entered By: Angelina Pih on 08/08/2022 10:00:07 -------------------------------------------------------------------------------- Clinic Level of Care Assessment Details Patient Name: Date of Service: Raymond [name] 08/08/2022 9:45 A M Medical Record Number: [id number] Patient Account Number: [id number] Date of Birth/Sex: Treating RN: 1965-12-14 (57 y.o. Raymond [name] Primary Care Christinia Lambeth: Martie Round Other Clinician: Referring Raydel Hosick: Treating Trishia Cuthrell/Extender: Gwendlyn Deutscher in Treatment: 4 Clinic Level of Care  Assessment Items TOOL 1 Quantity Score []  - 0 Use when EandM and Procedure is performed on INITIAL visit ASSESSMENTS - Nursing Assessment / Reassessment []  - 0 General Physical Exam (combine w/ comprehensive assessment (listed just below) when performed on new pt. evals) []  - 0 Comprehensive Assessment (HX, ROS, Risk Assessments, Wounds Hx, etc.) BOBAK, PYNES ([id number]) [id number] Page 2 of 9 ASSESSMENTS - Wound and Skin Assessment / Reassessment []  - 0 Dermatologic / Skin Assessment (not related to wound area) ASSESSMENTS - Ostomy and/or Continence Assessment and Care []  - 0 Incontinence Assessment and Management []  - 0 Ostomy Care Assessment and Management (repouching, etc.) PROCESS - Coordination of Care []  - 0 Simple Patient / Family Education for ongoing care []  - 0 Complex (extensive) Patient / Family Education for ongoing care []  - 0 Staff obtains Chiropractor, Records, T Results / Process Orders est []  - 0 Staff telephones HHA, Nursing Homes / Clarify orders / etc []  - 0 Routine Transfer to another Facility (non-emergent condition) []  - 0 Routine Hospital Admission (non-emergent condition) []  - 0 New Admissions / Manufacturing engineer / Ordering NPWT Apligraf, etc. , []  - 0 Emergency Hospital Admission (emergent condition) PROCESS - Special Needs []  - 0 Pediatric / Minor Patient Management []  - 0 Isolation Patient Management []  - 0 Hearing / Language / Visual special needs []  - 0 Assessment of Community assistance (transportation, D/C planning, etc.) []  - 0 Additional assistance / Altered mentation []  - 0 Support Surface(s) Assessment (bed, cushion, seat, etc.) INTERVENTIONS - Miscellaneous []  - 0 External ear exam []  - 0 Patient Transfer (multiple staff / Nurse, adult / Similar devices) []  - 0 Simple Staple / Suture removal (25 or less) []  - 0 Complex Staple / Suture removal (26 or more) []  - 0 Hypo/Hyperglycemic  Management (do not check if billed separately) []  - 0 Ankle / Brachial Index (ABI) - do not check if billed separately Has the patient been seen at the hospital within the  last three years: Yes Total Score: 0 Level Of Care: ____ Electronic Signature(s) Signed: 08/08/2022 3:32:36 PM By: Angelina Pih Entered By: Angelina Pih on 08/08/2022 10:32:29 -------------------------------------------------------------------------------- Encounter Discharge Information Details Patient Name: Date of Service: Raymond Blood D. 08/08/2022 9:45 A M Medical Record Number: [id number] Patient Account Number: [id number] Date of Birth/Sex: Treating RN: 02-27-1965 (57 y.o. Raymond [name] Primary Care Nury Nebergall: Martie Round Other Clinician: Referring Yukie Bergeron: Treating Leeandra Ellerson/Extender: Gwendlyn Deutscher in Treatment: 4 IRBIN, KURMAN ([id number]) [phone].pdf Page 3 of 9 Encounter Discharge Information Items Post Procedure Vitals Discharge Condition: Stable Temperature (F): 98.2 Ambulatory Status: Ambulatory Pulse (bpm): 80 Discharge Destination: Home Respiratory Rate (breaths/min): 20 Transportation: Private Auto Blood Pressure (mmHg): 171/76 Accompanied By: self Schedule Follow-up Appointment: Yes Clinical Summary of Care: Electronic Signature(s) Signed: 08/08/2022 1:28:20 PM By: Angelina Pih Entered By: Angelina Pih on 08/08/2022 13:28:20 -------------------------------------------------------------------------------- Lower Extremity Assessment Details Patient Name: Date of Service: Raymond Blood D. 08/08/2022 9:45 A M Medical Record Number: [id number] Patient Account Number: [id number] Date of Birth/Sex: Treating RN: April 20, 1965 (57 y.o. Raymond [name] Primary Care Anaria Kroner: Martie Round Other Clinician: Referring Raymond [name]: Treating Julienne Vogler/Extender: Valerie Salts Weeks in Treatment: 4 Edema  Assessment Assessed: [Left: No] [Right: No] Edema: [Left: Ye] [Right: s] Calf Left: Right: Point of Measurement: 37 cm From Medial Instep 50.3 cm Ankle Left: Right: Point of Measurement: 13 cm From Medial Instep 34.1 cm Vascular Assessment Pulses: Dorsalis Pedis Palpable: [Right:Yes] Electronic Signature(s) Signed: 08/08/2022 3:32:36 PM By: Angelina Pih Entered By: Angelina Pih on 08/08/2022 10:09:35 Lucia Gaskins ([id number]) [id number] Page 4 of 9 -------------------------------------------------------------------------------- Multi Wound Chart Details Patient Name: Date of Service: Raymond [name] 08/08/2022 9:45 A M Medical Record Number: [id number] Patient Account Number: [id number] Date of Birth/Sex: Treating RN: Apr 01, 1965 (57 y.o. Raymond [name] Primary Care Autumnrose Yore: Martie Round Other Clinician: Referring Anabella Capshaw: Treating Dearia Wilmouth/Extender: Gwendlyn Deutscher in Treatment: 4 Vital Signs Height(in): 74 Pulse(bpm): 80 Weight(lbs): 398 Blood Pressure(mmHg): 171/76 Body Mass Index(BMI): 51.1 Temperature(F): 98.2 Respiratory Rate(breaths/min): 20 [4:Photos:] [N/A:N/A] Right Amputation Site - Toe N/A N/A Wound Location: Surgical Injury N/A N/A Wounding Event: Open Surgical Wound N/A N/A Primary Etiology: Congestive Heart Failure, N/A N/A Comorbid History: Hypertension, Type II Diabetes, Neuropathy 06/09/2022 N/A N/A Date Acquired: 4 N/A N/A Weeks of Treatment: Open N/A N/A Wound Status: No N/A N/A Wound Recurrence: 2x0.5x0.5 N/A N/A Measurements L x W x D (cm) 0.785 N/A N/A A (cm) : rea 0.393 N/A N/A Volume (cm) : 52.40% N/A N/A % Reduction in Area: 91.20% N/A N/A % Reduction in Volume: Full Thickness Without Exposed N/A N/A Classification: Support Structures Medium N/A N/A Exudate Amount: Serosanguineous N/A N/A Exudate Type: red, brown N/A N/A Exudate Color: Small  (1-33%) N/A N/A Granulation Amount: Red, Pink N/A N/A Granulation Quality: Large (67-100%) N/A N/A Necrotic Amount: Fat Layer (Subcutaneous Tissue): Yes N/A N/A Exposed Structures: Fascia: No Tendon: No Muscle: No Joint: No Bone: No Small (1-33%) N/A N/A Epithelialization: Treatment Notes Electronic Signature(s) Signed: 08/08/2022 3:32:36 PM By: Angelina Pih Entered By: Angelina Pih on 08/08/2022 10:30:48 Lucia Gaskins ([id number]) [id number] Page 5 of 9 -------------------------------------------------------------------------------- Multi-Disciplinary Care Plan Details Patient Name: Date of Service: Raymond [name] 08/08/2022 9:45 A M Medical Record Number: [id number] Patient Account Number: [id number] Date of Birth/Sex: Treating RN: 01-Jul-1965 (57 y.o. Raymond [name] Primary Care Tomaz Janis: Martie Round Other Clinician: Referring Allure Greaser: Treating Monda Chastain/Extender: Valerie Salts  Weeks in Treatment: 4 Active Inactive Wound/Skin Impairment Nursing Diagnoses: Impaired tissue integrity Knowledge deficit related to ulceration/compromised skin integrity Goals: Patient/caregiver will verbalize understanding of skin care regimen Date Initiated: 07/08/2022 Date Inactivated: 08/01/2022 Target Resolution Date: 08/06/2022 Goal Status: Met Ulcer/skin breakdown will have a volume reduction of 30% by week 4 Date Initiated: 07/08/2022 Date Inactivated: 08/01/2022 Target Resolution Date: 08/06/2022 Goal Status: Met Ulcer/skin breakdown will have a volume reduction of 50% by week 8 Date Initiated: 07/08/2022 Date Inactivated: 08/08/2022 Target Resolution Date: 09/07/2022 Goal Status: Met Ulcer/skin breakdown will have a volume reduction of 80% by week 12 Date Initiated: 07/08/2022 Target Resolution Date: 10/08/2022 Goal Status: Active Ulcer/skin breakdown will heal within 14 weeks Date Initiated: 07/08/2022 Target Resolution Date:  10/22/2022 Goal Status: Active Interventions: Assess patient/caregiver ability to obtain necessary supplies Assess patient/caregiver ability to perform ulcer/skin care regimen upon admission and as needed Assess ulceration(s) every visit Provide education on ulcer and skin care Screen for HBO Treatment Activities: Referred to DME Hersel Mcmeen for dressing supplies : 07/08/2022 Skin care regimen initiated : 07/08/2022 Topical wound management initiated : 07/08/2022 Notes: Electronic Signature(s) Signed: 08/08/2022 3:32:36 PM By: Angelina Pih Entered By: Angelina Pih on 08/08/2022 10:38:38 Lucia Gaskins ([id number]) [id number] Page 6 of 9 -------------------------------------------------------------------------------- Pain Assessment Details Patient Name: Date of Service: Raymond [name]. 08/08/2022 9:45 A M Medical Record Number: [id number] Patient Account Number: [id number] Date of Birth/Sex: Treating RN: 26-May-1965 (57 y.o. Raymond [name] Primary Care Emili Mcloughlin: Martie Round Other Clinician: Referring Benjimin Hadden: Treating Biridiana Twardowski/Extender: Valerie Salts Weeks in Treatment: 4 Active Problems Location of Pain Severity and Description of Pain Patient Has Paino No Site Locations Rate the pain. Current Pain Level: 0 Pain Management and Medication Current Pain Management: Electronic Signature(s) Signed: 08/08/2022 3:32:36 PM By: Angelina Pih Entered By: Angelina Pih on 08/08/2022 10:02:41 -------------------------------------------------------------------------------- Patient/Caregiver Education Details Patient Name: Date of Service: Raymond [name] 7/8/2024andnbsp9:45 A M Medical Record Number: [id number] Patient Account Number: [id number] Date of Birth/Gender: Treating RN: July 16, 1965 (57 y.o. Raymond [name] Primary Care Physician: Martie Round Other Clinician: Referring Physician: Treating  Physician/Extender: Gwendlyn Deutscher in Treatment: 4 KEAGEN, ADRAGNA ([id number]) [id number].pdf Page 7 of 9 Education Assessment Education Provided To: Patient Education Topics Provided Wound Debridement: Handouts: Wound Debridement Methods: Explain/Verbal Responses: State content correctly Wound/Skin Impairment: Handouts: Caring for Your Ulcer Methods: Explain/Verbal Responses: State content correctly Electronic Signature(s) Signed: 08/08/2022 3:32:36 PM By: Angelina Pih Entered By: Angelina Pih on 08/08/2022 13:26:35 -------------------------------------------------------------------------------- Wound Assessment Details Patient Name: Date of Service: Raymond Blood D. 08/08/2022 9:45 A M Medical Record Number: [id number] Patient Account Number: [id number] Date of Birth/Sex: Treating RN: [birth year] (57 y.o. Raymond [name] Primary Care Myah Guynes: Martie Round Other Clinician: Referring Audrea Bolte: Treating Breyden Jeudy/Extender: Valerie Salts Weeks in Treatment: 4 Wound Status Wound Number: 4 Primary Open Surgical Wound Etiology: Wound Location: Right Amputation Site - Toe Wound Status: Open Wounding Event: Surgical Injury Comorbid Congestive Heart Failure, Hypertension, Type II Diabetes, Date Acquired: 06/09/2022 History: Neuropathy Weeks Of Treatment: 4 Clustered Wound: No Photos Wound Measurements Length: (cm) 2 Width: (cm) 0 Depth: (cm) 0 Area: (cm) Volume: (cm) JACIER, BAYHA ([id number]) Wound Description Classification: Full Thickness Without Exposed Suppo Exudate Amount: Medium Exudate Type: Serosanguineous Exudate Color: red, brown rt Structures Foul Odor After Cleansing: Slough/Fibrino % Reduction in Area: 52.4% .5 % Reduction in Volume: 91.2% .5 Epithelialization: Small (1-33%) 0.785 Tunneling: No 0.393 Undermining: No [id number] Page  8 of  9 No Yes Wound Bed Granulation Amount: Small (1-33%) Exposed Structure Granulation Quality: Red, Pink Fascia Exposed: No Necrotic Amount: Large (67-100%) Fat Layer (Subcutaneous Tissue) Exposed: Yes Tendon Exposed: No Muscle Exposed: No Joint Exposed: No Bone Exposed: No Treatment Notes Wound #4 (Amputation Site - Toe) Wound Laterality: Right Cleanser Soap and Water Discharge Instruction: Gently cleanse wound with antibacterial soap, rinse and pat dry prior to dressing wounds Peri-Wound Care Topical Primary Dressing Prisma 4.34 (in) Discharge Instruction: pack in dry Secondary Dressing Gauze Discharge Instruction: As directed: dry, moistened with saline or moistened with Dakins Solution Secured With Medipore T - 26M Medipore H Soft Cloth Surgical T ape ape, 2x2 (in/yd) Kerlix Roll Sterile or Non-Sterile 6-ply 4.5x4 (yd/yd) Discharge Instruction: Apply Kerlix as directed Compression Wrap Compression Stockings Add-Ons Electronic Signature(s) Signed: 08/08/2022 3:32:36 PM By: Angelina Pih Entered By: Angelina Pih on 08/08/2022 10:10:36 -------------------------------------------------------------------------------- Vitals Details Patient Name: Date of Service: [name], [name] 08/08/2022 9:45 A M Medical Record Number: [id number] Patient Account Number: [id number] Date of Birth/Sex: Treating RN: 04-23-1965 (57 y.o. Raymond [name] Primary Care Rayola Everhart: Martie Round Other Clinician: Referring Paislynn Hegstrom: Treating Melaney Tellefsen/Extender: Valerie Salts Weeks in Treatment: 4 Vital Signs Time Taken: 10:00 Temperature (F): 98.2 SIDHANT, KULT ([id number]) [id number] Page 9 of 9 Height (in): 74 Pulse (bpm): 80 Weight (lbs): 398 Respiratory Rate (breaths/min): 20 Body Mass Index (BMI): 51.1 Blood Pressure (mmHg): 171/76 Reference Range: 80 - 120 mg / dl Notes PA Stone aware, pt states he forgot to take his BP  meds today Electronic Signature(s) Signed: 08/08/2022 3:32:36 PM By: Angelina Pih Entered By: Angelina Pih on 08/08/2022 10:30:24

## 2022-08-08 NOTE — Progress Notes (Addendum)
Raymond [name], Raymond [name] ([id number]) [id number] Page 1 of 10 Visit Report for 08/08/2022 Chief Complaint Document Details Patient Name: Date of Service: Raymond [name]. 08/08/2022 9:45 A M Medical Record Number: [id number] Patient Account Number: [id number] Date of Birth/Sex: Treating RN: [birth year] (57 y.o. Raymond [name] Primary Care Provider: Martie Round Other Clinician: Referring Provider: Treating Provider/Extender: Gwendlyn Deutscher in Treatment: 4 Information Obtained from: Patient Chief Complaint Left foot ulcer from surgical dehiscence following 2nd toe amputation right foot Electronic Signature(s) Signed: 08/08/2022 10:11:35 AM By: Allen Derry PA-C Entered By: Allen Derry on 08/08/2022 10:11:35 -------------------------------------------------------------------------------- Debridement Details Patient Name: Date of Service: Raymond Blood D. 08/08/2022 9:45 A M Medical Record Number: [id number] Patient Account Number: [id number] Date of Birth/Sex: Treating RN: May 23, 1965 (57 y.o. Raymond [name] Primary Care Provider: Martie Round Other Clinician: Referring Provider: Treating Provider/Extender: Gwendlyn Deutscher in Treatment: 4 Debridement Performed for Assessment: Wound #4 Right Amputation Site - Toe Performed By: Physician Allen Derry, PA-C Debridement Type: Debridement Level of Consciousness (Pre-procedure): Awake and Alert Pre-procedure Verification/Time Out Yes - 10:30 Taken: Percent of Wound Bed Debrided: 100% T Area Debrided (cm): otal 0.78 Tissue and other material debrided: Viable, Non-Viable, Callus, Slough, Subcutaneous, Slough Level: Skin/Subcutaneous Tissue Debridement Description: Excisional Instrument: Curette Bleeding: Moderate Hemostasis Achieved: Pressure Response to Treatment: Procedure was tolerated well Level of Consciousness (Post- Awake and  Alert procedure): Post Debridement Measurements of Total Wound Raymond [name] ([id number]) [id number] Page 2 of 10 Length: (cm) 2 Width: (cm) 0.5 Depth: (cm) 0.7 Volume: (cm) 0.55 Character of Wound/Ulcer Post Debridement: Stable Post Procedure Diagnosis Same as Pre-procedure Electronic Signature(s) Signed: 08/08/2022 3:32:36 PM By: Angelina Pih Signed: 08/08/2022 5:05:34 PM By: Allen Derry PA-C Entered By: Angelina Pih on 08/08/2022 10:32:21 -------------------------------------------------------------------------------- HPI Details Patient Name: Date of Service: Raymond Blood D. 08/08/2022 9:45 A M Medical Record Number: [id number] Patient Account Number: [id number] Date of Birth/Sex: Treating RN: November 20, 1965 (57 y.o. Raymond [name] Primary Care Provider: Martie Round Other Clinician: Referring Provider: Treating Provider/Extender: Valerie Salts Weeks in Treatment: 4 History of Present Illness HPI Description: 09/05/17-He is seeing an initial evaluation for a left plantar foot ulcer. He has a remote history of left great toe amputation. He states that 4-6 weeks ago he noted callus formation and ulceration. He has not seen primary care regarding this. He is not currently on antibiotic therapy. He does not routinely follow with podiatry. He states diabetic foot wear will arrive early next week. The EHR shows an A1c of 9% approximate 4 months ago but he states he had one and primary care a few weeks ago but does not know the results. He is neuropathic and does not complain of any pain, he is currently wearing crocs. 09/12/17-he is here in follow up evaluation for left plantar foot ulcer. There is improvement in both appearance and measurement. We will continue with same treatment plan and he will follow up next week. 09/19/17 on evaluation today patient actually appears to be doing excellent in regard to his ulcer on the  invitation site of his left great foot plantar aspect. He's been tolerating the dressing changes without complication. In fact with the Prisma and the current measures he has been shown signs of excellent improvement week by week up to this point. We have been to breeding the wound in this seems to have been of great benefit for him. Fortunately there is no evidence of infection. 09/26/17  on evaluation today patient appears to be doing rather well in regard to his wound. He did not know quite as much improvement this week as compared to last week. Nonetheless he still continues to show signs of improving to some degree. I do believe he may benefit from an offloading shoe. No fevers, chills, nausea, or vomiting noted at this time. 10/03/17 on evaluation today patient actually appears to be doing much better in regard to the amputation site plantar foot ulcer. Overall this appears significantly smaller even compared to previous. He's been tolerating the dressing changes without complication. He did get his diabetic shoes and I did have a look at them they appear to be fairly good. Nonetheless I do think that for the time being I would probably recommend he continue with the offloading shoe that we have been utilizing just due to the fact that with the dressing I don't know that his diabetic shoes are going to work as appropriately as far as not called an additional pressure and irritation to the area in question. He understands. 10/10/17 on evaluation today patient actually appears to be doing very well in regard to his plantar foot ulcer. He has been tolerating the dressing changes without complication. I do feel like he's making signs of good improvement and in fact of the wound bed appears to be better although it may not be significantly changed in size it appears healthier and I do believe is showing signs of improving. Nonetheless we gonna keep working towards healing as far as that is concerned.  There is no evidence of infection. 10/17/17 on evaluation today patient actually appears to be doing poorly in regard to his plantar foot ulcer. Unfortunately other than just the area where the wound is itself there appears to be a blister that communicates with the wound unfortunately. This is more lateral to the wound itself and also to the distal point of the amputation site. There does not appear to be any evidence of cellulitis spreading at the foot but I do believe some of the drainage from the site itself is. When in nature. Nonetheless this blister area I think needs to be removed in order to allow for appropriate healing of the ulcer itself I think that is gonna be difficult for it to heal otherwise. This was discussed with the patient today. 10/24/17 on evaluation today patient actually appears to be doing better compared to last week even post debridement. He has shown signs of improvement he did test positive for Staphylococcus aureus. With that being said he notes that he's not have any discomfort and in general he does feel like things seem to been doing better. Fortunately there's no additional blistering and no evidence of remaining infection at this time. No fevers, chills, nausea, or vomiting noted at this time. He has three days of antibiotic left. 10/31/17 on evaluation today patient actually appears to be doing very well in regard to his ulcer on the left foot. He has been tolerating the dressing changes without complication. With that being said fortunately there appears to be no evidence of infection I do think he's made progress compared to previous. No fevers, chills, nausea, or vomiting noted at this time. Raymond [name], Raymond [name] ([id number]) [id number] Page 3 of 10 11/14/17 on evaluation today patient actually appears to be doing excellent in regard to his amputation site ulcer on his foot. In fact this appears to be completely healed at this point.  There is no evidence of infection nor  underline abscess and I did thoroughly evaluate the periwound location. Overall I'm very pleased with how things appear. Readmission: 01-03-2022 upon evaluation today patient appears for reevaluation here in the clinic although it has been since 2019 that I last saw him. This again has been over 4 years. With that being said he is having an issue with a wound on his foot unfortunately. He has not had any recent x-rays he tells me at this point which is unfortunate as avoid definitely can need to get something started that can be one of the primary items to get moving forward with. With that being said I also think were probably can obtain a wound culture he is probably going to require some antibiotics at some point. I just want to make sure that we have him on the right thing when we do this. Currently the patient tells me that his wounds have been present in regard to the met head for about a month at this point. With that being said he also has a wound on the third toe which is the next toe this actually still present. This unfortunately has a lot of callus buildup around it but I think it is larger underneath than what it would appear on initial inspection. Patient does have a history of diabetes mellitus type 2, congestive heart failure, and COPD. 01-11-2022 upon evaluation patient's wounds actually are showing signs of doing about the same may be just slightly cleaner but definitely not where we want things to be as of yet. Fortunately there does not appear to be any signs of active infection locally nor systemically at this point which is great news. No fevers, chills, nausea, vomiting, or diarrhea. Of note patient has had his MRI but we do not have the results of that as of yet we are still waiting for the reading on this at this point. 01-18-2022 upon evaluation today patient presents after having had his MRI finally complete. It did show that he has  evidence of osteomyelitis of the first metatarsal head, third digit, and fourth digit. The third and fourth digits seem to be early which is good news. Nonetheless we are still going to need to try to see about getting the infection under control he is already been on the antibiotic therapy which I think has done quite well. In fact I feel like he is showing signs of improvement already. 12/28; patient with a wound on the medial aspect of the left first metatarsal head and an area on the left third toe with slight hammer deformity. He has had a previous left first toe amputation. We are using Iodoflex on the wound. He is a diabetic a recent MRI showed osteomyelitis we have been giving him Cipro and Doxy which he appears to be tolerating well. 02-10-2022 upon evaluation today patient unfortunately has issues still with open wounds of his foot on the left. He does have known osteomyelitis at this point and that is something that we have been treating with oral antibiotics. I actually have not seen him personally since 19 December. In that time he has not had any debridement from that point until today based on what I can see as best I can tell anyway. Has been on Cipro as well as doxycycline. Nonetheless he does still seem to have open wounds today and I think that he would benefit from hyperbaric oxygen therapy. I discussed that with him today as well. 02-18-2022 upon evaluation today patient appears to be doing  about the same in regard to his wounds. Fortunately I do not see any signs of infection although he still has areas that seem to initially close down but that he has a lot of callus that still open at both locations most on the metatarsal region as well as the toe. I think that he is appropriate for proceeding with the hyperbarics and I think the sooner we get this done to try to get these wounds healed the better off he will be. 02-25-2022 upon evaluation today patient's wounds do show signs of  being dry get again. We were attempting to pack and some of the alginate dressing instead of doing the collagen as everything was getting very dry but nonetheless this is still continue to be an ongoing issue. My suggestion based on what I am seeing is good to be that we actually switch to doing Xeroform which should hopefully keep this a little bit more moist and from hopefully closing up prematurely. The patient voiced understanding. 03-03-2022 upon evaluation today patient appears to be doing well with regard to his wounds things as before appear to be getting dry. We are using Xeroform to try to keep it open is much as possible but each time I see him he does tend to cover over the good news is each time he also seems to be getting a little bit smaller which is excellent. Fortunately I do not see any evidence of active infection locally nor systemically which is great news. I think this means that between the antibiotics and the hyperbarics were really on a good track here. 03-10-2022 upon evaluation today patient appears to be doing well currently in regard to his wounds. He is actually showing signs of some good improvement he is also tolerating HBO therapy very well. I do not see any evidence of active infection locally nor systemically which is great news and overall I am extremely pleased in that regard. No fevers, chills, nausea, vomiting, or diarrhea. 03-17-2022 upon evaluation today patient appears to be doing well currently in regard to his wounds in fact the toe is completely healed. The metatarsal head location though not completely healed appears to be doing much better which is great news and overall I am extremely pleased with where we stand today. 03-24-2022 upon evaluation today patient appears to be doing well currently in regard to his wounds. He has been tolerating the dressing changes without complication. Fortunately there does not appear to be any signs of active infection locally  nor systemically in fact I am not so sure this wound is very close to being healed. I did perform some debridement to clearway some of the callus and the patient tolerated this today without complication. Postdebridement the wound bed is significantly improved. 03-31-2022 upon evaluation today patient appears to be doing well currently in regard to his wound which is actually showing signs of being completely healed. This is great news. Fortunately I do believe that the patient is doing much better he still on the oral antibiotics which I think is helping to treat the osteomyelitis is also undergoing hyperbaric oxygen therapy which I think is doing a really good job here as well. 04-21-2022 upon evaluation today patient continues to remain completely closed. He seems to be doing excellent and is very close to completion of his 40 treatments of HBO therapy which that is the point where recommend discontinue therapy at that point. Fortunately I do not see any evidence of active infection locally nor  systemically at this time which is great news. Readmission: 07-08-2022 unfortunately Mr. Hamade since I last saw him ended up having an issue with osteomyelitis of his great toe right side which included having to have amputation. This amputation was performed actually on 06-08-2022 and was again amputated secondary to the osteomyelitis. With that being said following however this has dehisced and therefore the patient was referred to [place] for further evaluation and treatment of the dehisced surgical wound at the amputation site of the right great toe. His past medical history really has not changed significantly since I last saw him in the first of the year we will be doing hyperbarics for the left foot we are able to get all this healed that still is doing well according to what the patient tells me at this point. 07-15-2022 upon evaluation today patient appears to be doing well currently in regard to his  wound. This in fact is showing signs of good improvement in my opinion. Fortunately I do not see any evidence of active infection locally nor systemically which is great news. No fevers, chills, nausea, vomiting, or diarrhea. 07-25-2022 upon evaluation today patient appears to be doing well currently in regard to his foot ulcer is actually measuring smaller and looking better I am actually pleased in that regard. We did get the results back from his pathology that showed necrotic bone but nothing that appears to be actually open at this point. Fortunately I do not see any signs of active infection at this time. 08-01-22 upon evaluation today patient appears to be doing well currently in regard to his wound which is showing signs of improvement I think we may want to switch over to collagen however. XIN, BOSSHARDT ([id number]) [id number] Page 4 of 10 08-08-2022 upon evaluation patient's wound is actually showing signs of significant improvement. Fortunately I do not see any signs of active infection at this time which is great news and in general I do believe that we are making headway towards complete closure which is great news as well. Electronic Signature(s) Signed: 08/08/2022 3:09:37 PM By: Allen Derry PA-C Entered By: Allen Derry on 08/08/2022 15:09:37 -------------------------------------------------------------------------------- Physical Exam Details Patient Name: Date of Service: Raymond Blood D. 08/08/2022 9:45 A M Medical Record Number: [id number] Patient Account Number: [id number] Date of Birth/Sex: Treating RN: 06/18/1965 (57 y.o. Raymond [name] Primary Care Provider: Martie Round Other Clinician: Referring Provider: Treating Provider/Extender: Valerie Salts Weeks in Treatment: 4 Constitutional Well-nourished and well-hydrated in no acute distress. Respiratory normal breathing without difficulty. Psychiatric this  patient is able to make decisions and demonstrates good insight into disease process. Alert and Oriented x 3. pleasant and cooperative. Notes Upon inspection patient's wound bed actually showed signs of good granulation epithelization at this point. Fortunately I do not see any evidence of worsening overall and I do believe that we are making headway towards complete closure which is great news. Postdebridement this was not as deep as what it was last week. Electronic Signature(s) Signed: 08/08/2022 3:09:55 PM By: Allen Derry PA-C Entered By: Allen Derry on 08/08/2022 15:09:55 -------------------------------------------------------------------------------- Physician Orders Details Patient Name: Date of Service: Raymond Blood D. 08/08/2022 9:45 A M Medical Record Number: [id number] Patient Account Number: [id number] Date of Birth/Sex: Treating RN: 1965-08-24 (57 y.o. Raymond [name] Primary Care Provider: Martie Round Other Clinician: Referring Provider: Treating Provider/Extender: Gwendlyn Deutscher in Treatment: 4 Verbal / Phone Orders: No Diagnosis Coding GONZALES, MASLOWSKI ([id number])  [id number] Page 5 of 10 ICD-10 Coding Code Description T81.31XA Disruption of external operation (surgical) wound, not elsewhere classified, initial encounter L97.512 Non-pressure chronic ulcer of other part of right foot with fat layer exposed E11.621 Type 2 diabetes mellitus with foot ulcer I50.42 Chronic combined systolic (congestive) and diastolic (congestive) heart failure J44.9 Chronic obstructive pulmonary disease, unspecified Follow-up Appointments Return Appointment in 1 week. Bathing/ Shower/ Hygiene May shower; gently cleanse wound with antibacterial soap, rinse and pat dry prior to dressing wounds Off-Loading Open toe surgical shoe Wound Treatment Wound #4 - Amputation Site - Toe Wound Laterality: Right Cleanser: Soap and Water  3 x Per Week/30 Days Discharge Instructions: Gently cleanse wound with antibacterial soap, rinse and pat dry prior to dressing wounds Prim Dressing: Prisma 4.34 (in) 3 x Per Week/30 Days ary Discharge Instructions: pack in dry Secondary Dressing: Gauze (Generic) 3 x Per Week/30 Days Discharge Instructions: As directed: dry, moistened with saline or moistened with Dakins Solution Secured With: Medipore T - 21M Medipore H Soft Cloth Surgical T ape ape, 2x2 (in/yd) (Generic) 3 x Per Week/30 Days Secured With: [employer] Sterile or Non-Sterile 6-ply 4.5x4 (yd/yd) (Generic) 3 x Per Week/30 Days Discharge Instructions: Apply Kerlix as directed Electronic Signature(s) Signed: 08/08/2022 3:32:36 PM By: Angelina Pih Signed: 08/08/2022 5:05:34 PM By: Allen Derry PA-C Entered By: Angelina Pih on 08/08/2022 10:32:04 -------------------------------------------------------------------------------- Problem List Details Patient Name: Date of Service: Raymond Blood D. 08/08/2022 9:45 A M Medical Record Number: [id number] Patient Account Number: [id number] Date of Birth/Sex: Treating RN: 28-Aug-1965 (57 y.o. Raymond [name] Primary Care Provider: Martie Round Other Clinician: Referring Provider: Treating Provider/Extender: Gwendlyn Deutscher in Treatment: 4 Active Problems ICD-10 Encounter Code Description Active Date MDM Diagnosis T81.31XA Disruption of external operation (surgical) wound, not elsewhere classified, 07/08/2022 No Yes initial encounter L97.512 Non-pressure chronic ulcer of other part of right foot with fat layer exposed 07/08/2022 No Yes TAKERU, MCADEN ([id number]) [id number] Page 6 of 10 E11.621 Type 2 diabetes mellitus with foot ulcer 07/08/2022 No Yes I50.42 Chronic combined systolic (congestive) and diastolic (congestive) heart failure 07/08/2022 No Yes J44.9 Chronic obstructive pulmonary disease, unspecified 07/08/2022 No  Yes Inactive Problems Resolved Problems Electronic Signature(s) Signed: 08/08/2022 10:11:32 AM By: Allen Derry PA-C Entered By: Allen Derry on 08/08/2022 10:11:32 -------------------------------------------------------------------------------- Progress Note Details Patient Name: Date of Service: Raymond Blood D. 08/08/2022 9:45 A M Medical Record Number: [id number] Patient Account Number: [id number] Date of Birth/Sex: Treating RN: [birth year] (57 y.o. Raymond [name] Primary Care Provider: Martie Round Other Clinician: Referring Provider: Treating Provider/Extender: Gwendlyn Deutscher in Treatment: 4 Subjective Chief Complaint Information obtained from Patient Left foot ulcer from surgical dehiscence following 2nd toe amputation right foot History of Present Illness (HPI) 09/05/17-He is seeing an initial evaluation for a left plantar foot ulcer. He has a remote history of left great toe amputation. He states that 4-6 weeks ago he noted callus formation and ulceration. He has not seen primary care regarding this. He is not currently on antibiotic therapy. He does not routinely follow with podiatry. He states diabetic foot wear will arrive early next week. The EHR shows an A1c of 9% approximate 4 months ago but he states he had one and primary care a few weeks ago but does not know the results. He is neuropathic and does not complain of any pain, he is currently wearing crocs. 09/12/17-he is here in follow up evaluation for left plantar foot ulcer. There  is improvement in both appearance and measurement. We will continue with same treatment plan and he will follow up next week. 09/19/17 on evaluation today patient actually appears to be doing excellent in regard to his ulcer on the invitation site of his left great foot plantar aspect. He's been tolerating the dressing changes without complication. In fact with the Prisma and the current measures he has been shown signs  of excellent improvement week by week up to this point. We have been to breeding the wound in this seems to have been of great benefit for him. Fortunately there is no evidence of infection. 09/26/17 on evaluation today patient appears to be doing rather well in regard to his wound. He did not know quite as much improvement this week as compared to last week. Nonetheless he still continues to show signs of improving to some degree. I do believe he may benefit from an offloading shoe. No fevers, chills, nausea, or vomiting noted at this time. 10/03/17 on evaluation today patient actually appears to be doing much better in regard to the amputation site plantar foot ulcer. Overall this appears significantly smaller even compared to previous. He's been tolerating the dressing changes without complication. He did get his diabetic shoes and I did have a look at them they appear to be fairly good. Nonetheless I do think that for the time being I would probably recommend he continue with the offloading shoe that we have been utilizing just due to the fact that with the dressing I don't know that his diabetic shoes are going to work as appropriately as far as not called an additional pressure and irritation to the area in question. He understands. 10/10/17 on evaluation today patient actually appears to be doing very well in regard to his plantar foot ulcer. He has been tolerating the dressing changes without complication. I do feel like he's making signs of good improvement and in fact of the wound bed appears to be better although it may not be significantly changed in size it appears healthier and I do believe is showing signs of improving. Nonetheless we Raymond [name] keep working towards healing as far as Raymond [name], Raymond [name] ([id number]) [id number] Page 7 of 10 that is concerned. There is no evidence of infection. 10/17/17 on evaluation today patient actually appears to be doing poorly  in regard to his plantar foot ulcer. Unfortunately other than just the area where the wound is itself there appears to be a blister that communicates with the wound unfortunately. This is more lateral to the wound itself and also to the distal point of the amputation site. There does not appear to be any evidence of cellulitis spreading at the foot but I do believe some of the drainage from the site itself is. When in nature. Nonetheless this blister area I think needs to be removed in order to allow for appropriate healing of the ulcer itself I think that is gonna be difficult for it to heal otherwise. This was discussed with the patient today. 10/24/17 on evaluation today patient actually appears to be doing better compared to last week even post debridement. He has shown signs of improvement he did test positive for Staphylococcus aureus. With that being said he notes that he's not have any discomfort and in general he does feel like things seem to been doing better. Fortunately there's no additional blistering and no evidence of remaining infection at this time. No fevers, chills, nausea, or vomiting noted at this time. He has  three days of antibiotic left. 10/31/17 on evaluation today patient actually appears to be doing very well in regard to his ulcer on the left foot. He has been tolerating the dressing changes without complication. With that being said fortunately there appears to be no evidence of infection I do think he's made progress compared to previous. No fevers, chills, nausea, or vomiting noted at this time. 11/14/17 on evaluation today patient actually appears to be doing excellent in regard to his amputation site ulcer on his foot. In fact this appears to be completely healed at this point. There is no evidence of infection nor underline abscess and I did thoroughly evaluate the periwound location. Overall I'm very pleased with how things appear. Readmission: 01-03-2022 upon  evaluation today patient appears for reevaluation here in the clinic although it has been since 2019 that I last saw him. This again has been over 4 years. With that being said he is having an issue with a wound on his foot unfortunately. He has not had any recent x-rays he tells me at this point which is unfortunate as avoid definitely can need to get something started that can be one of the primary items to get moving forward with. With that being said I also think were probably can obtain a wound culture he is probably going to require some antibiotics at some point. I just want to make sure that we have him on the right thing when we do this. Currently the patient tells me that his wounds have been present in regard to the met head for about a month at this point. With that being said he also has a wound on the third toe which is the next toe this actually still present. This unfortunately has a lot of callus buildup around it but I think it is larger underneath than what it would appear on initial inspection. Patient does have a history of diabetes mellitus type 2, congestive heart failure, and COPD. 01-11-2022 upon evaluation patient's wounds actually are showing signs of doing about the same may be just slightly cleaner but definitely not where we want things to be as of yet. Fortunately there does not appear to be any signs of active infection locally nor systemically at this point which is great news. No fevers, chills, nausea, vomiting, or diarrhea. Of note patient has had his MRI but we do not have the results of that as of yet we are still waiting for the reading on this at this point. 01-18-2022 upon evaluation today patient presents after having had his MRI finally complete. 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 The third and fourth digits seem to be early which is good news. Nonetheless we are still going to need to  try to see about getting the infection under control he is already been on the antibiotic therapy which I think has done quite well. In fact I feel like he is showing signs of improvement already. 12/28; patient with a wound on the medial aspect of the left first metatarsal head and an area on the left third toe with slight hammer deformity. He has had a previous left first toe amputation. We are using Iodoflex on the wound. He is a diabetic a recent MRI showed osteomyelitis we have been giving him Cipro and Doxy which he appears to be tolerating well. 02-10-2022 upon evaluation today patient unfortunately has issues still with open wounds of  his foot on the left. He does have known osteomyelitis at this point and that is something that we have been treating with oral antibiotics. I actually have not seen him personally since 19 December. In that time he has not had any debridement from that point until today based on what I can see as best I can tell anyway. Has been on Cipro as well as doxycycline. Nonetheless he does still seem to have open wounds today and I think that he would benefit from hyperbaric oxygen therapy. I discussed that with him today as well. 02-18-2022 upon evaluation today patient appears to be doing about the same in regard to his wounds. Fortunately I do not see any signs of infection although he still has areas that seem to initially close down but that he has a lot of callus that still open at both locations most on the metatarsal region as well as the toe. I think that he is appropriate for proceeding with the hyperbarics and I think the sooner we get this done to try to get these wounds healed the better off he will be. 02-25-2022 upon evaluation today patient's wounds do show signs of being dry get again. We were attempting to pack and some of the alginate dressing instead of doing the collagen as everything was getting very dry but nonetheless this is still continue to be an  ongoing issue. My suggestion based on what I am seeing is good to be that we actually switch to doing Xeroform which should hopefully keep this a little bit more moist and from hopefully closing up prematurely. The patient voiced understanding. 03-03-2022 upon evaluation today patient appears to be doing well with regard to his wounds things as before appear to be getting dry. We are using Xeroform to try to keep it open is much as possible but each time I see him he does tend to cover over the good news is each time he also seems to be getting a little bit smaller which is excellent. Fortunately I do not see any evidence of active infection locally nor systemically which is great news. I think this means that between the antibiotics and the hyperbarics were really on a good track here. 03-10-2022 upon evaluation today patient appears to be doing well currently in regard to his wounds. He is actually showing signs of some good improvement he is also tolerating HBO therapy very well. I do not see any evidence of active infection locally nor systemically which is great news and overall I am extremely pleased in that regard. No fevers, chills, nausea, vomiting, or diarrhea. 03-17-2022 upon evaluation today patient appears to be doing well currently in regard to his wounds in fact the toe is completely healed. The metatarsal head location though not completely healed appears to be doing much better which is great news and overall I am extremely pleased with where we stand today. 03-24-2022 upon evaluation today patient appears to be doing well currently in regard to his wounds. He has been tolerating the dressing changes without complication. Fortunately there does not appear to be any signs of active infection locally nor systemically in fact I am not so sure this wound is very close to being healed. I did perform some debridement to clearway some of the callus and the patient tolerated this today without  complication. Postdebridement the wound bed is significantly improved. 03-31-2022 upon evaluation today patient appears to be doing well currently in regard to his wound which is actually  showing signs of being completely healed. This is great news. Fortunately I do believe that the patient is doing much better he still on the oral antibiotics which I think is helping to treat the osteomyelitis is also undergoing hyperbaric oxygen therapy which I think is doing a really good job here as well. 04-21-2022 upon evaluation today patient continues to remain completely closed. He seems to be doing excellent and is very close to completion of his 40 treatments of HBO therapy which that is the point where recommend discontinue therapy at that point. Fortunately I do not see any evidence of active infection locally nor systemically at this time which is great news. Readmission: 07-08-2022 unfortunately Raymond [name] since I last saw him ended up having an issue with osteomyelitis of his great toe right side which included having to have amputation. This amputation was performed actually on 06-08-2022 and was again amputated secondary to the osteomyelitis. With that being said following Raymond [name], Raymond [name] ([id number]) [id number] Page 8 of 10 however this has dehisced and therefore the patient was referred to [place] for further evaluation and treatment of the dehisced surgical wound at the amputation site of the right great toe. His past medical history really has not changed significantly since I last saw him in the first of the year we will be doing hyperbarics for the left foot we are able to get all this healed that still is doing well according to what the patient tells me at this point. 07-15-2022 upon evaluation today patient appears to be doing well currently in regard to his wound. This in fact is showing signs of good improvement in my opinion. Fortunately I do not see any  evidence of active infection locally nor systemically which is great news. No fevers, chills, nausea, vomiting, or diarrhea. 07-25-2022 upon evaluation today patient appears to be doing well currently in regard to his foot ulcer is actually measuring smaller and looking better I am actually pleased in that regard. We did get the results back from his pathology that showed necrotic bone but nothing that appears to be actually open at this point. Fortunately I do not see any signs of active infection at this time. 08-01-22 upon evaluation today patient appears to be doing well currently in regard to his wound which is showing signs of improvement I think we may want to switch over to collagen however. 08-08-2022 upon evaluation patient's wound is actually showing signs of significant improvement. Fortunately I do not see any signs of active infection at this time which is great news and in general I do believe that we are making headway towards complete closure which is great news as well. Objective Constitutional Well-nourished and well-hydrated in no acute distress. Vitals Time Taken: 10:00 AM, Height: 74 in, Weight: 398 lbs, BMI: 51.1, Temperature: 98.2 F, Pulse: 80 bpm, Respiratory Rate: 20 breaths/min, Blood Pressure: 171/76 mmHg. General Notes: PA Stone aware, pt states he forgot to take his BP meds today Respiratory normal breathing without difficulty. Psychiatric this patient is able to make decisions and demonstrates good insight into disease process. Alert and Oriented x 3. pleasant and cooperative. General Notes: Upon inspection patient's wound bed actually showed signs of good granulation epithelization at this point. Fortunately I do not see any evidence of worsening overall and I do believe that we are making headway towards complete closure which is great news. Postdebridement this was not as deep as what it was last week. Integumentary (Hair, Skin) Wound #4 status is  Open. Original  cause of wound was Surgical Injury. The date acquired was: 06/09/2022. The wound has been in treatment 4 weeks. The wound is located on the Right Amputation Site - T The wound measures 2cm length x 0.5cm width x 0.5cm depth; 0.785cm^2 area and 0.393cm^3 volume. There is oe. Fat Layer (Subcutaneous Tissue) exposed. There is no tunneling or undermining noted. There is a medium amount of serosanguineous drainage noted. There is small (1-33%) red, pink granulation within the wound bed. There is a large (67-100%) amount of necrotic tissue within the wound bed. Assessment Active Problems ICD-10 Disruption of external operation (surgical) wound, not elsewhere classified, initial encounter Non-pressure chronic ulcer of other part of right foot with fat layer exposed Type 2 diabetes mellitus with foot ulcer Chronic combined systolic (congestive) and diastolic (congestive) heart failure Chronic obstructive pulmonary disease, unspecified Procedures Wound #4 Pre-procedure diagnosis of Wound #4 is an Open Surgical Wound located on the Right Amputation Site - T . There was a Excisional Skin/Subcutaneous oe Tissue Debridement with a total area of 0.78 sq cm performed by Allen Derry, PA-C. With the following instrument(s): Curette to remove Viable and Non- Viable tissue/material. Material removed includes Callus, Subcutaneous Tissue, and Slough. No specimens were taken. A time out was conducted at 10:30, prior to the start of the procedure. A Moderate amount of bleeding was controlled with Pressure. The procedure was tolerated well. Post Debridement Measurements: 2cm length x 0.5cm width x 0.7cm depth; 0.55cm^3 volume. Character of Wound/Ulcer Post Debridement is stable. Post procedure Diagnosis Wound #4: Same as Pre-Procedure Raymond [name], Raymond [name] ([id number]) [id number] Page 9 of 10 Plan Follow-up Appointments: Return Appointment in 1 week. Bathing/ Shower/ Hygiene: May  shower; gently cleanse wound with antibacterial soap, rinse and pat dry prior to dressing wounds Off-Loading: Open toe surgical shoe WOUND #4: - Amputation Site - T oe Wound Laterality: Right Cleanser: Soap and Water 3 x Per Week/30 Days Discharge Instructions: Gently cleanse wound with antibacterial soap, rinse and pat dry prior to dressing wounds Prim Dressing: Prisma 4.34 (in) 3 x Per Week/30 Days ary Discharge Instructions: pack in dry Secondary Dressing: Gauze (Generic) 3 x Per Week/30 Days Discharge Instructions: As directed: dry, moistened with saline or moistened with Dakins Solution Secured With: Medipore T - 21M Medipore H Soft Cloth Surgical T ape ape, 2x2 (in/yd) (Generic) 3 x Per Week/30 Days Secured With: [employer] Sterile or Non-Sterile 6-ply 4.5x4 (yd/yd) (Generic) 3 x Per Week/30 Days Discharge Instructions: Apply Kerlix as directed 1. I would recommend that we have the patient continue to monitor for any signs of infection or worsening. Based on what I am seeing I do think that we are making good progress towards closure. 2. I am also can recommend that we have the patient continue to utilize the collagen to the base of the wound I think this is doing really well we will put it in dry due to the drainage has been having at this point. We will see patient back for reevaluation in 1 week here in the clinic. If anything worsens or changes patient will contact our office for additional recommendations. Electronic Signature(s) Signed: 08/08/2022 3:10:25 PM By: Allen Derry PA-C Entered By: Allen Derry on 08/08/2022 15:10:25 -------------------------------------------------------------------------------- SuperBill Details Patient Name: Date of Service: [name], [name] 08/08/2022 Medical Record Number: [id number] Patient Account Number: [id number] Date of Birth/Sex: Treating RN: 1965/12/09 (57 y.o. Raymond [name] Primary Care Provider: Martie Round Other  Clinician: Referring Provider: Treating Provider/Extender: Larina Bras,  Laurence Ferrari, Joni Reining Weeks in Treatment: 4 Diagnosis Coding ICD-10 Codes Code Description T81.31XA Disruption of external operation (surgical) wound, not elsewhere classified, initial encounter L97.512 Non-pressure chronic ulcer of other part of right foot with fat layer exposed E11.621 Type 2 diabetes mellitus with foot ulcer I50.42 Chronic combined systolic (congestive) and diastolic (congestive) heart failure J44.9 Chronic obstructive pulmonary disease, unspecified Facility Procedures Physician Procedures : CPT4 Code Description Modifier [id number] - WC PHYS SUBQ TISS 20 SQ CM ICD-10 Diagnosis Description L97.512 Non-pressure chronic ulcer of other part of right foot with fat layer exposed Quantity: 1 Electronic Signature(s) Signed: 08/08/2022 3:10:36 PM By: Allen Derry PA-C Entered By: Allen Derry on 08/08/2022 15:10:35

## 2022-08-15 ENCOUNTER — Encounter: Payer: Medicare HMO | Admitting: Physician Assistant

## 2022-08-15 DIAGNOSIS — E11621 Type 2 diabetes mellitus with foot ulcer: Secondary | ICD-10-CM | POA: Diagnosis not present

## 2022-08-17 NOTE — Progress Notes (Signed)
BALEN, WOOLUM ([id number]) [id number] Page 1 of 9 Visit Report for 08/15/2022 Chief Complaint Document Details Patient Name: Date of Service: Raymond [name]. 08/15/2022 9:45 A M Medical Record Number: [id number] Patient Account Number: [id number] Date of Birth/Sex: Treating RN: Jun 15, 1965 (57 y.o. Raymond [name] Primary Care Provider: Martie [name] Other Clinician: Referring Provider: Treating Provider/Extender: Gwendlyn Deutscher in Treatment: 5 Information Obtained from: Patient Chief Complaint Left foot ulcer from surgical dehiscence following 2nd toe amputation right foot Electronic Signature(s) Signed: 08/15/2022 2:48:12 PM By: Allen Derry PA-C Entered By: Allen Derry on 08/15/2022 14:48:12 -------------------------------------------------------------------------------- HPI Details Patient Name: Date of Service: Raymond Blood D. 08/15/2022 9:45 A M Medical Record Number: [id number] Patient Account Number: [id number] Date of Birth/Sex: Treating RN: 1965/10/31 (57 y.o. Raymond [name] Primary Care Provider: Martie [name] Other Clinician: Referring Provider: Treating Provider/Extender: Valerie Salts Weeks in Treatment: 5 History of Present Illness HPI Description: 09/05/17-He is seeing an initial evaluation for a left plantar foot ulcer. He has a remote history of left great toe amputation. He states that 4-6 weeks ago he noted callus formation and ulceration. He has not seen primary care regarding this. He is not currently on antibiotic therapy. He does not routinely follow with podiatry. He states diabetic foot wear will arrive early next week. The EHR shows an A1c of 9% approximate 4 months ago but he states he had one and primary care a few weeks ago but does not know the results. He is neuropathic and does not complain of any pain, he is currently wearing crocs. 09/12/17-he is here in  follow up evaluation for left plantar foot ulcer. There is improvement in both appearance and measurement. We will continue with same treatment plan and he will follow up next week. 09/19/17 on evaluation today patient actually appears to be doing excellent in regard to his ulcer on the invitation site of his left great foot plantar aspect. He's been tolerating the dressing changes without complication. In fact with the Prisma and the current measures he has been shown signs of excellent improvement week by week up to this point. We have been to breeding the wound in this seems to have been of great benefit for him. Fortunately there is no evidence of infection. 09/26/17 on evaluation today patient appears to be doing rather well in regard to his wound. He did not know quite as much improvement this week as compared to last week. Nonetheless he still continues to show signs of improving to some degree. I do believe he may benefit from an offloading shoe. No fevers, chills, nausea, or vomiting noted at this time. CREEDON, DANIELSKI ([id number]) [id number] Page 2 of 9 10/03/17 on evaluation today patient actually appears to be doing much better in regard to the amputation site plantar foot ulcer. Overall this appears significantly smaller even compared to previous. He's been tolerating the dressing changes without complication. He did get his diabetic shoes and I did have a look at them they appear to be fairly good. Nonetheless I do think that for the time being I would probably recommend he continue with the offloading shoe that we have been utilizing just due to the fact that with the dressing I don't know that his diabetic shoes are going to work as appropriately as far as not called an additional pressure and irritation to the area in question. He understands. 10/10/17 on evaluation today patient actually appears to be doing very well  in regard to his plantar foot ulcer.  He has been tolerating the dressing changes without complication. I do feel like he's making signs of good improvement and in fact of the wound bed appears to be better although it may not be significantly changed in size it appears healthier and I do believe is showing signs of improving. Nonetheless we gonna keep working towards healing as far as that is concerned. There is no evidence of infection. 10/17/17 on evaluation today patient actually appears to be doing poorly in regard to his plantar foot ulcer. Unfortunately other than just the area where the wound is itself there appears to be a blister that communicates with the wound unfortunately. This is more lateral to the wound itself and also to the distal point of the amputation site. There does not appear to be any evidence of cellulitis spreading at the foot but I do believe some of the drainage from the site itself is. When in nature. Nonetheless this blister area I think needs to be removed in order to allow for appropriate healing of the ulcer itself I think that is gonna be difficult for it to heal otherwise. This was discussed with the patient today. 10/24/17 on evaluation today patient actually appears to be doing better compared to last week even post debridement. He has shown signs of improvement he did test positive for Staphylococcus aureus. With that being said he notes that he's not have any discomfort and in general he does feel like things seem to been doing better. Fortunately there's no additional blistering and no evidence of remaining infection at this time. No fevers, chills, nausea, or vomiting noted at this time. He has three days of antibiotic left. 10/31/17 on evaluation today patient actually appears to be doing very well in regard to his ulcer on the left foot. He has been tolerating the dressing changes without complication. With that being said fortunately there appears to be no evidence of infection I do think he's  made progress compared to previous. No fevers, chills, nausea, or vomiting noted at this time. 11/14/17 on evaluation today patient actually appears to be doing excellent in regard to his amputation site ulcer on his foot. In fact this appears to be completely healed at this point. There is no evidence of infection nor underline abscess and I did thoroughly evaluate the periwound location. Overall I'm very pleased with how things appear. Readmission: 01-03-2022 upon evaluation today patient appears for reevaluation here in the clinic although it has been since 2019 that I last saw him. This again has been over 4 years. With that being said he is having an issue with a wound on his foot unfortunately. He has not had any recent x-rays he tells me at this point which is unfortunate as avoid definitely can need to get something started that can be one of the primary items to get moving forward with. With that being said I also think were probably can obtain a wound culture he is probably going to require some antibiotics at some point. I just want to make sure that we have him on the right thing when we do this. Currently the patient tells me that his wounds have been present in regard to the met head for about a month at this point. With that being said he also has a wound on the third toe which is the next toe this actually still present. This unfortunately has a lot of callus buildup around it but I  think it is larger underneath than what it would appear on initial inspection. Patient does have a history of diabetes mellitus type 2, congestive heart failure, and COPD. 01-11-2022 upon evaluation patient's wounds actually are showing signs of doing about the same may be just slightly cleaner but definitely not where we want things to be as of yet. Fortunately there does not appear to be any signs of active infection locally nor systemically at this point which is great news. No fevers, chills, nausea,  vomiting, or diarrhea. Of note patient has had his MRI but we do not have the results of that as of yet we are still waiting for the reading on this at this point. 01-18-2022 upon evaluation today patient presents after having had his MRI finally complete. 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 The third and fourth digits seem to be early which is good news. Nonetheless we are still going to need to try to see about getting the infection under control he is already been on the antibiotic therapy which I think has done quite well. In fact I feel like he is showing signs of improvement already. 12/28; patient with a wound on the medial aspect of the left first metatarsal head and an area on the left third toe with slight hammer deformity. He has had a previous left first toe amputation. We are using Iodoflex on the wound. He is a diabetic a recent MRI showed osteomyelitis we have been giving him Cipro and Doxy which he appears to be tolerating well. 02-10-2022 upon evaluation today patient unfortunately has issues still with open wounds of his foot on the left. He does have known osteomyelitis at this point and that is something that we have been treating with oral antibiotics. I actually have not seen him personally since 19 December. In that time he has not had any debridement from that point until today based on what I can see as best I can tell anyway. Has been on Cipro as well as doxycycline. Nonetheless he does still seem to have open wounds today and I think that he would benefit from hyperbaric oxygen therapy. I discussed that with him today as well. 02-18-2022 upon evaluation today patient appears to be doing about the same in regard to his wounds. Fortunately I do not see any signs of infection although he still has areas that seem to initially close down but that he has a lot of callus that still open at both locations most  on the metatarsal region as well as the toe. I think that he is appropriate for proceeding with the hyperbarics and I think the sooner we get this done to try to get these wounds healed the better off he will be. 02-25-2022 upon evaluation today patient's wounds do show signs of being dry get again. We were attempting to pack and some of the alginate dressing instead of doing the collagen as everything was getting very dry but nonetheless this is still continue to be an ongoing issue. My suggestion based on what I am seeing is good to be that we actually switch to doing Xeroform which should hopefully keep this a little bit more moist and from hopefully closing up prematurely. The patient voiced understanding. 03-03-2022 upon evaluation today patient appears to be doing well with regard to his wounds things as before appear to be getting dry. We are using Xeroform to try to keep it  open is much as possible but each time I see him he does tend to cover over the good news is each time he also seems to be getting a little bit smaller which is excellent. Fortunately I do not see any evidence of active infection locally nor systemically which is great news. I think this means that between the antibiotics and the hyperbarics were really on a good track here. 03-10-2022 upon evaluation today patient appears to be doing well currently in regard to his wounds. He is actually showing signs of some good improvement he is also tolerating HBO therapy very well. I do not see any evidence of active infection locally nor systemically which is great news and overall I am extremely pleased in that regard. No fevers, chills, nausea, vomiting, or diarrhea. 03-17-2022 upon evaluation today patient appears to be doing well currently in regard to his wounds in fact the toe is completely healed. The metatarsal head location though not completely healed appears to be doing much better which is great news and overall I am extremely  pleased with where we stand today. 03-24-2022 upon evaluation today patient appears to be doing well currently in regard to his wounds. He has been tolerating the dressing changes without complication. Fortunately there does not appear to be any signs of active infection locally nor systemically in fact I am not so sure this wound is very close to being healed. I did perform some debridement to clearway some of the callus and the patient tolerated this today without complication. Postdebridement the wound bed is significantly improved. 03-31-2022 upon evaluation today patient appears to be doing well currently in regard to his wound which is actually showing signs of being completely healed. This is great news. Fortunately I do believe that the patient is doing much better he still on the oral antibiotics which I think is helping to treat the osteomyelitis is also undergoing hyperbaric oxygen therapy which I think is doing a really good job here as well. Raymond [name], Raymond [name] ([id number]) [id number] Page 3 of 9 04-21-2022 upon evaluation today patient continues to remain completely closed. He seems to be doing excellent and is very close to completion of his 40 treatments of HBO therapy which that is the point where recommend discontinue therapy at that point. Fortunately I do not see any evidence of active infection locally nor systemically at this time which is great news. Readmission: 07-08-2022 unfortunately Raymond [name] since I last saw him ended up having an issue with osteomyelitis of his great toe right side which included having to have amputation. This amputation was performed actually on 06-08-2022 and was again amputated secondary to the osteomyelitis. With that being said following however this has dehisced and therefore the patient was referred to [place] for further evaluation and treatment of the dehisced surgical wound at the amputation site of the right great  toe. His past medical history really has not changed significantly since I last saw him in the first of the year we will be doing hyperbarics for the left foot we are able to get all this healed that still is doing well according to what the patient tells me at this point. 07-15-2022 upon evaluation today patient appears to be doing well currently in regard to his wound. This in fact is showing signs of good improvement in my opinion. Fortunately I do not see any evidence of active infection locally nor systemically which is great news. No fevers, chills, nausea, vomiting, or diarrhea. 07-25-2022 upon  evaluation today patient appears to be doing well currently in regard to his foot ulcer is actually measuring smaller and looking better I am actually pleased in that regard. We did get the results back from his pathology that showed necrotic bone but nothing that appears to be actually open at this point. Fortunately I do not see any signs of active infection at this time. 08-01-22 upon evaluation today patient appears to be doing well currently in regard to his wound which is showing signs of improvement I think we may want to switch over to collagen however. 08-08-2022 upon evaluation patient's wound is actually showing signs of significant improvement. Fortunately I do not see any signs of active infection at this time which is great news and in general I do believe that we are making headway towards complete closure which is great news as well. 08-15-2022 upon evaluation today patient is making excellent progress towards closure. This wound looks to be doing excellent. Is not nearly as deep as what it was and I think that he is making great progress towards where we want to be. I am very pleased at this time. Electronic Signature(s) Signed: 08/15/2022 6:06:26 PM By: Allen Derry PA-C Entered By: Allen Derry on 08/15/2022  18:06:26 -------------------------------------------------------------------------------- Physical Exam Details Patient Name: Date of Service: Raymond Blood D. 08/15/2022 9:45 A M Medical Record Number: [id number] Patient Account Number: [id number] Date of Birth/Sex: Treating RN: [birth year] (57 y.o. Raymond [name] Primary Care Provider: Martie [name] Other Clinician: Referring Provider: Treating Provider/Extender: Valerie Salts Weeks in Treatment: 5 Constitutional Obese and well-hydrated in no acute distress. Respiratory normal breathing without difficulty. Psychiatric this patient is able to make decisions and demonstrates good insight into disease process. Alert and Oriented x 3. pleasant and cooperative. Notes Upon inspection patient's wound bed showed evidence of good granulation epithelization at this point. Fortunately I do not see any signs of active infection locally nor systemically which is great news and in general I do think that we are making good headway towards complete closure. Electronic Signature(s) Signed: 08/15/2022 6:06:56 PM By: Allen Derry PA-C Entered By: Allen Derry on 08/15/2022 18:06:56 Raymond [name] ([id number]) [id number] Page 4 of 9 -------------------------------------------------------------------------------- Physician Orders Details Patient Name: Date of Service: Raymond [name] 08/15/2022 9:45 A M Medical Record Number: [id number] Patient Account Number: [id number] Date of Birth/Sex: Treating RN: Mar 24, 1965 (56 y.o. Judie Petit) Yevonne Pax Primary Care Provider: Martie [name] Other Clinician: Referring Provider: Treating Provider/Extender: Gwendlyn Deutscher in Treatment: 5 Verbal / Phone Orders: No Diagnosis Coding Follow-up Appointments Return Appointment in 1 week. Bathing/ Shower/ Hygiene May shower; gently cleanse wound with antibacterial soap, rinse and pat  dry prior to dressing wounds Off-Loading Open toe surgical shoe Wound Treatment Wound #4 - Amputation Site - Toe Wound Laterality: Right Cleanser: Soap and Water 3 x Per Week/30 Days Discharge Instructions: Gently cleanse wound with antibacterial soap, rinse and pat dry prior to dressing wounds Prim Dressing: Promogran Matrix 4.34 (in) 3 x Per Week/30 Days ary Discharge Instructions: Moisten w/normal saline or sterile water; Cover wound as directed. Do not remove from wound bed. Secondary Dressing: Gauze (Generic) 3 x Per Week/30 Days Discharge Instructions: As directed: dry, moistened with saline or moistened with Dakins Solution Secured With: Medipore T - 110M Medipore H Soft Cloth Surgical T ape ape, 2x2 (in/yd) (Generic) 3 x Per Week/30 Days Secured With: [employer] Sterile or Non-Sterile 6-ply 4.5x4 (yd/yd) (Generic) 3 x Per  Week/30 Days Discharge Instructions: Apply Kerlix as directed Electronic Signature(s) Signed: 08/15/2022 12:39:04 PM By: Angelina Pih Signed: 08/16/2022 5:59:11 PM By: Allen Derry PA-C Entered By: Angelina Pih on 08/15/2022 11:08:24 -------------------------------------------------------------------------------- Problem List Details Patient Name: Date of Service: Raymond Blood D. 08/15/2022 9:45 A M Medical Record Number: [id number] Patient Account Number: [id number] Date of Birth/Sex: Treating RN: 1965-08-17 (57 y.o. Raymond [name] Primary Care Provider: Martie [name] Other Clinician: Lucia [name] ([id number]) [id number] Page 5 of 9 Referring Provider: Treating Provider/Extender: Gwendlyn Deutscher in Treatment: 5 Active Problems ICD-10 Encounter Code Description Active Date MDM Diagnosis T81.31XA Disruption of external operation (surgical) wound, not elsewhere classified, 07/08/2022 No Yes initial encounter L97.512 Non-pressure chronic ulcer of other part of right foot with fat layer  exposed 07/08/2022 No Yes E11.621 Type 2 diabetes mellitus with foot ulcer 07/08/2022 No Yes I50.42 Chronic combined systolic (congestive) and diastolic (congestive) heart failure 07/08/2022 No Yes J44.9 Chronic obstructive pulmonary disease, unspecified 07/08/2022 No Yes Inactive Problems Resolved Problems Electronic Signature(s) Signed: 08/15/2022 2:48:09 PM By: Allen Derry PA-C Entered By: Allen Derry on 08/15/2022 14:48:09 -------------------------------------------------------------------------------- Progress Note Details Patient Name: Date of Service: Raymond Blood D. 08/15/2022 9:45 A M Medical Record Number: [id number] Patient Account Number: [id number] Date of Birth/Sex: Treating RN: 05-28-1965 (57 y.o. Raymond [name] Primary Care Provider: Martie [name] Other Clinician: Referring Provider: Treating Provider/Extender: Gwendlyn Deutscher in Treatment: 5 Subjective Chief Complaint Information obtained from Patient Left foot ulcer from surgical dehiscence following 2nd toe amputation right foot History of Present Illness (HPI) 09/05/17-He is seeing an initial evaluation for a left plantar foot ulcer. He has a remote history of left great toe amputation. He states that 4-6 weeks ago he noted callus formation and ulceration. He has not seen primary care regarding this. He is not currently on antibiotic therapy. He does not routinely follow with podiatry. He states diabetic foot wear will arrive early next week. The EHR shows an A1c of 9% approximate 4 months ago but he states he had one and primary care a few weeks ago but does not know the results. He is neuropathic and does not complain of any pain, he is currently wearing crocs. 09/12/17-he is here in follow up evaluation for left plantar foot ulcer. There is improvement in both appearance and measurement. We will continue with same treatment plan and he will follow up next week. Raymond [name], Raymond [name]  ([id number]) [id number] Page 6 of 9 09/19/17 on evaluation today patient actually appears to be doing excellent in regard to his ulcer on the invitation site of his left great foot plantar aspect. He's been tolerating the dressing changes without complication. In fact with the Prisma and the current measures he has been shown signs of excellent improvement week by week up to this point. We have been to breeding the wound in this seems to have been of great benefit for him. Fortunately there is no evidence of infection. 09/26/17 on evaluation today patient appears to be doing rather well in regard to his wound. He did not know quite as much improvement this week as compared to last week. Nonetheless he still continues to show signs of improving to some degree. I do believe he may benefit from an offloading shoe. No fevers, chills, nausea, or vomiting noted at this time. 10/03/17 on evaluation today patient actually appears to be doing much better in regard to the amputation site plantar foot ulcer. Overall this appears  significantly smaller even compared to previous. He's been tolerating the dressing changes without complication. He did get his diabetic shoes and I did have a look at them they appear to be fairly good. Nonetheless I do think that for the time being I would probably recommend he continue with the offloading shoe that we have been utilizing just due to the fact that with the dressing I don't know that his diabetic shoes are going to work as appropriately as far as not called an additional pressure and irritation to the area in question. He understands. 10/10/17 on evaluation today patient actually appears to be doing very well in regard to his plantar foot ulcer. He has been tolerating the dressing changes without complication. I do feel like he's making signs of good improvement and in fact of the wound bed appears to be better although it may not be significantly  changed in size it appears healthier and I do believe is showing signs of improving. Nonetheless we gonna keep working towards healing as far as that is concerned. There is no evidence of infection. 10/17/17 on evaluation today patient actually appears to be doing poorly in regard to his plantar foot ulcer. Unfortunately other than just the area where the wound is itself there appears to be a blister that communicates with the wound unfortunately. This is more lateral to the wound itself and also to the distal point of the amputation site. There does not appear to be any evidence of cellulitis spreading at the foot but I do believe some of the drainage from the site itself is. When in nature. Nonetheless this blister area I think needs to be removed in order to allow for appropriate healing of the ulcer itself I think that is gonna be difficult for it to heal otherwise. This was discussed with the patient today. 10/24/17 on evaluation today patient actually appears to be doing better compared to last week even post debridement. He has shown signs of improvement he did test positive for Staphylococcus aureus. With that being said he notes that he's not have any discomfort and in general he does feel like things seem to been doing better. Fortunately there's no additional blistering and no evidence of remaining infection at this time. No fevers, chills, nausea, or vomiting noted at this time. He has three days of antibiotic left. 10/31/17 on evaluation today patient actually appears to be doing very well in regard to his ulcer on the left foot. He has been tolerating the dressing changes without complication. With that being said fortunately there appears to be no evidence of infection I do think he's made progress compared to previous. No fevers, chills, nausea, or vomiting noted at this time. 11/14/17 on evaluation today patient actually appears to be doing excellent in regard to his amputation site ulcer  on his foot. In fact this appears to be completely healed at this point. There is no evidence of infection nor underline abscess and I did thoroughly evaluate the periwound location. Overall I'm very pleased with how things appear. Readmission: 01-03-2022 upon evaluation today patient appears for reevaluation here in the clinic although it has been since 2019 that I last saw him. This again has been over 4 years. With that being said he is having an issue with a wound on his foot unfortunately. He has not had any recent x-rays he tells me at this point which is unfortunate as avoid definitely can need to get something started that can be one of  the primary items to get moving forward with. With that being said I also think were probably can obtain a wound culture he is probably going to require some antibiotics at some point. I just want to make sure that we have him on the right thing when we do this. Currently the patient tells me that his wounds have been present in regard to the met head for about a month at this point. With that being said he also has a wound on the third toe which is the next toe this actually still present. This unfortunately has a lot of callus buildup around it but I think it is larger underneath than what it would appear on initial inspection. Patient does have a history of diabetes mellitus type 2, congestive heart failure, and COPD. 01-11-2022 upon evaluation patient's wounds actually are showing signs of doing about the same may be just slightly cleaner but definitely not where we want things to be as of yet. Fortunately there does not appear to be any signs of active infection locally nor systemically at this point which is great news. No fevers, chills, nausea, vomiting, or diarrhea. Of note patient has had his MRI but we do not have the results of that as of yet we are still waiting for the reading on this at this point. 01-18-2022 upon evaluation today patient  presents after having had his MRI finally complete. 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 The third and fourth digits seem to be early which is good news. Nonetheless we are still going to need to try to see about getting the infection under control he is already been on the antibiotic therapy which I think has done quite well. In fact I feel like he is showing signs of improvement already. 12/28; patient with a wound on the medial aspect of the left first metatarsal head and an area on the left third toe with slight hammer deformity. He has had a previous left first toe amputation. We are using Iodoflex on the wound. He is a diabetic a recent MRI showed osteomyelitis we have been giving him Cipro and Doxy which he appears to be tolerating well. 02-10-2022 upon evaluation today patient unfortunately has issues still with open wounds of his foot on the left. He does have known osteomyelitis at this point and that is something that we have been treating with oral antibiotics. I actually have not seen him personally since 19 December. In that time he has not had any debridement from that point until today based on what I can see as best I can tell anyway. Has been on Cipro as well as doxycycline. Nonetheless he does still seem to have open wounds today and I think that he would benefit from hyperbaric oxygen therapy. I discussed that with him today as well. 02-18-2022 upon evaluation today patient appears to be doing about the same in regard to his wounds. Fortunately I do not see any signs of infection although he still has areas that seem to initially close down but that he has a lot of callus that still open at both locations most on the metatarsal region as well as the toe. I think that he is appropriate for proceeding with the hyperbarics and I think the sooner we get this done to try to get these wounds healed the better off  he will be. 02-25-2022 upon evaluation today patient's wounds do show signs  of being dry get again. We were attempting to pack and some of the alginate dressing instead of doing the collagen as everything was getting very dry but nonetheless this is still continue to be an ongoing issue. My suggestion based on what I am seeing is good to be that we actually switch to doing Xeroform which should hopefully keep this a little bit more moist and from hopefully closing up prematurely. The patient voiced understanding. 03-03-2022 upon evaluation today patient appears to be doing well with regard to his wounds things as before appear to be getting dry. We are using Xeroform to try to keep it open is much as possible but each time I see him he does tend to cover over the good news is each time he also seems to be getting a little bit smaller which is excellent. Fortunately I do not see any evidence of active infection locally nor systemically which is great news. I think this means that between the antibiotics and the hyperbarics were really on a good track here. 03-10-2022 upon evaluation today patient appears to be doing well currently in regard to his wounds. He is actually showing signs of some good improvement he is also tolerating HBO therapy very well. I do not see any evidence of active infection locally nor systemically which is great news and overall I am extremely pleased in that regard. No fevers, chills, nausea, vomiting, or diarrhea. 03-17-2022 upon evaluation today patient appears to be doing well currently in regard to his wounds in fact the toe is completely healed. The metatarsal head location though not completely healed appears to be doing much better which is great news and overall I am extremely pleased with where we stand today. Raymond [name], Raymond [name] ([id number]) [id number] Page 7 of 9 03-24-2022 upon evaluation today patient appears to be doing well currently in  regard to his wounds. He has been tolerating the dressing changes without complication. Fortunately there does not appear to be any signs of active infection locally nor systemically in fact I am not so sure this wound is very close to being healed. I did perform some debridement to clearway some of the callus and the patient tolerated this today without complication. Postdebridement the wound bed is significantly improved. 03-31-2022 upon evaluation today patient appears to be doing well currently in regard to his wound which is actually showing signs of being completely healed. This is great news. Fortunately I do believe that the patient is doing much better he still on the oral antibiotics which I think is helping to treat the osteomyelitis is also undergoing hyperbaric oxygen therapy which I think is doing a really good job here as well. 04-21-2022 upon evaluation today patient continues to remain completely closed. He seems to be doing excellent and is very close to completion of his 40 treatments of HBO therapy which that is the point where recommend discontinue therapy at that point. Fortunately I do not see any evidence of active infection locally nor systemically at this time which is great news. Readmission: 07-08-2022 unfortunately Raymond [name] since I last saw him ended up having an issue with osteomyelitis of his great toe right side which included having to have amputation. This amputation was performed actually on 06-08-2022 and was again amputated secondary to the osteomyelitis. With that being said following however this has dehisced and therefore the patient was referred to [place] for further evaluation and treatment of the dehisced surgical wound at the amputation site of the right  great toe. His past medical history really has not changed significantly since I last saw him in the first of the year we will be doing hyperbarics for the left foot we are able to get all this healed that  still is doing well according to what the patient tells me at this point. 07-15-2022 upon evaluation today patient appears to be doing well currently in regard to his wound. This in fact is showing signs of good improvement in my opinion. Fortunately I do not see any evidence of active infection locally nor systemically which is great news. No fevers, chills, nausea, vomiting, or diarrhea. 07-25-2022 upon evaluation today patient appears to be doing well currently in regard to his foot ulcer is actually measuring smaller and looking better I am actually pleased in that regard. We did get the results back from his pathology that showed necrotic bone but nothing that appears to be actually open at this point. Fortunately I do not see any signs of active infection at this time. 08-01-22 upon evaluation today patient appears to be doing well currently in regard to his wound which is showing signs of improvement I think we may want to switch over to collagen however. 08-08-2022 upon evaluation patient's wound is actually showing signs of significant improvement. Fortunately I do not see any signs of active infection at this time which is great news and in general I do believe that we are making headway towards complete closure which is great news as well. 08-15-2022 upon evaluation today patient is making excellent progress towards closure. This wound looks to be doing excellent. Is not nearly as deep as what it was and I think that he is making great progress towards where we want to be. I am very pleased at this time. Objective Constitutional Obese and well-hydrated in no acute distress. Vitals Time Taken: 10:15 AM, Height: 74 in, Weight: 398 lbs, BMI: 51.1, Temperature: 98.5 F, Pulse: 93 bpm, Respiratory Rate: 18 breaths/min, Blood Pressure: 152/61 mmHg. Respiratory normal breathing without difficulty. Psychiatric this patient is able to make decisions and demonstrates good insight into disease process.  Alert and Oriented x 3. pleasant and cooperative. General Notes: Upon inspection patient's wound bed showed evidence of good granulation epithelization at this point. Fortunately I do not see any signs of active infection locally nor systemically which is great news and in general I do think that we are making good headway towards complete closure. Integumentary (Hair, Skin) Wound #4 status is Open. Original cause of wound was Surgical Injury. The date acquired was: 06/09/2022. The wound has been in treatment 5 weeks. The wound is located on the Right Amputation Site - T The wound measures 1.6cm length x 0.5cm width x 0.5cm depth; 0.628cm^2 area and 0.314cm^3 volume. There oe. is Fat Layer (Subcutaneous Tissue) exposed. There is no tunneling or undermining noted. There is a medium amount of serosanguineous drainage noted. There is small (1-33%) red, pink granulation within the wound bed. There is a large (67-100%) amount of necrotic tissue within the wound bed. Assessment Active Problems ICD-10 Disruption of external operation (surgical) wound, not elsewhere classified, initial encounter Non-pressure chronic ulcer of other part of right foot with fat layer exposed Type 2 diabetes mellitus with foot ulcer Chronic combined systolic (congestive) and diastolic (congestive) heart failure Chronic obstructive pulmonary disease, unspecified Raymond [name], Raymond [name] ([id number]) [id number] Page 8 of 9 Plan Follow-up Appointments: Return Appointment in 1 week. Bathing/ Shower/ Hygiene: May shower; gently cleanse wound with antibacterial soap, rinse  and pat dry prior to dressing wounds Off-Loading: Open toe surgical shoe WOUND #4: - Amputation Site - T oe Wound Laterality: Right Cleanser: Soap and Water 3 x Per Week/30 Days Discharge Instructions: Gently cleanse wound with antibacterial soap, rinse and pat dry prior to dressing wounds Prim Dressing: Promogran Matrix 4.34  (in) 3 x Per Week/30 Days ary Discharge Instructions: Moisten w/normal saline or sterile water; Cover wound as directed. Do not remove from wound bed. Secondary Dressing: Gauze (Generic) 3 x Per Week/30 Days Discharge Instructions: As directed: dry, moistened with saline or moistened with Dakins Solution Secured With: Medipore T - 81M Medipore H Soft Cloth Surgical T ape ape, 2x2 (in/yd) (Generic) 3 x Per Week/30 Days Secured With: [employer] Sterile or Non-Sterile 6-ply 4.5x4 (yd/yd) (Generic) 3 x Per Week/30 Days Discharge Instructions: Apply Kerlix as directed 1. I would recommend that we have the patient continue to monitor for any evidence of infection or worsening. He is in agreement with plan. 2. I am also can recommend that we continue with applying collagen which I think is doing well for him. 3. I am also can recommend that the patient should continue to monitor for any signs of infection or worsening. Office if anything changes he knows contact the office let me know. We will see patient back for reevaluation in 1 week here in the clinic. If anything worsens or changes patient will contact our office for additional recommendations. Electronic Signature(s) Signed: 08/15/2022 6:07:30 PM By: Allen Derry PA-C Entered By: Allen Derry on 08/15/2022 18:07:30 -------------------------------------------------------------------------------- SuperBill Details Patient Name: Date of Service: Raymond [name], Raymond Niemann D. 08/15/2022 Medical Record Number: [id number] Patient Account Number: [id number] Date of Birth/Sex: Treating RN: 10-Mar-1965 (57 y.o. Raymond [name] Primary Care Provider: Martie [name] Other Clinician: Referring Provider: Treating Provider/Extender: Valerie Salts Weeks in Treatment: 5 Diagnosis Coding ICD-10 Codes Code Description T81.31XA Disruption of external operation (surgical) wound, not elsewhere classified, initial encounter L97.512 Non-pressure  chronic ulcer of other part of right foot with fat layer exposed E11.621 Type 2 diabetes mellitus with foot ulcer I50.42 Chronic combined systolic (congestive) and diastolic (congestive) heart failure J44.9 Chronic obstructive pulmonary disease, unspecified Facility Procedures : CPT4 Code: [id number] Description: 40347 - WOUND CARE VISIT-LEV 3 EST PT Modifier: Quantity: 1 Physician Procedures JADIS, MIKA ([id number]): CPT4 Code Description [id number] [phone] - WC PHYS LEVEL 3 - EST PT ICD-10 Diagnosis Description T81.31XA Disruption of external operation (surgical) wound, not elsewhere cla L97.512 Non-pressure chronic ulcer of other part  of right foot with fat laye E11.621 Type 2 diabetes mellitus with foot ulcer I50.42 Chronic combined systolic (congestive) and diastolic (congestive) he [id number] Page 9 of 9: Quantity Modifier 1 ssified, initial encounter r exposed art failure Electronic Signature(s) Signed: 08/15/2022 6:09:40 PM By: Allen Derry PA-C Previous Signature: 08/15/2022 11:09:01 AM Version By: Angelina Pih Entered By: Allen Derry on 08/15/2022 18:09:39

## 2022-08-18 NOTE — Progress Notes (Signed)
Raymond [name] ([id number]) [id number] Page 1 of 9 Visit Report for 08/15/2022 Arrival Information Details Patient Name: Date of Service: Raymond [name] 08/15/2022 9:45 A M Medical Record Number: [id number] Patient Account Number: [id number] Date of Birth/Sex: Treating RN: [birth year] (56 y.o. Raymond [name]) Raymond [name] Primary Care Raymond [name]: Raymond [name] Other Clinician: Referring Raymond [name]: Treating Raymond [name]/Extender: Raymond [name] in Treatment: 5 Visit Information History Since Last Visit Added or deleted any medications: No Patient Arrived: Ambulatory Any new allergies or adverse reactions: No Arrival Time: 10:12 Had a fall or experienced change in No Accompanied By: self activities of daily living that may affect Transfer Assistance: None risk of falls: Patient Identification Verified: Yes Signs or symptoms of abuse/neglect since last visito No Secondary Verification Process Completed: Yes Hospitalized since last visit: No Patient Requires Transmission-Based Precautions: No Implantable device outside of the clinic excluding No Patient Has Alerts: Yes cellular tissue based products placed in the center Patient Alerts: ABI R 1.37 L1.26 since last visit: Has Dressing in Place as Prescribed: Yes Has Compression in Place as Prescribed: Yes Pain Present Now: No Electronic Signature(s) Signed: 08/18/2022 3:42:23 PM By: Raymond Pax RN Entered By: Raymond [name] on 08/15/2022 10:15:52 -------------------------------------------------------------------------------- Clinic Level of Care Assessment Details Patient Name: Date of Service: Raymond [name] 08/15/2022 9:45 A M Medical Record Number: [id number] Patient Account Number: [id number] Date of Birth/Sex: Treating RN: 10/06/1965 (57 y.o. Raymond [name] Primary Care Jozeph Persing: Raymond [name] Other Clinician: Referring Raymond [name]: Treating Raymond [name]/Extender: Raymond [name] in Treatment: 5 Clinic Level of Care Assessment Items TOOL 4 Quantity Score []  - 0 Use when only an EandM is performed on FOLLOW-UP visit ASSESSMENTS - Nursing Assessment / Reassessment X- 1 10 Reassessment of Co-morbidities (includes updates in patient status) X- 1 5 Reassessment of Adherence to Treatment Plan Raymond, [name] ([id number]) [id number] Page 2 of 9 ASSESSMENTS - Wound and Skin A ssessment / Reassessment X - Simple Wound Assessment / Reassessment - one wound 1 5 []  - 0 Complex Wound Assessment / Reassessment - multiple wounds []  - 0 Dermatologic / Skin Assessment (not related to wound area) ASSESSMENTS - Focused Assessment []  - 0 Circumferential Edema Measurements - multi extremities []  - 0 Nutritional Assessment / Counseling / Intervention []  - 0 Lower Extremity Assessment (monofilament, tuning fork, pulses) []  - 0 Peripheral Arterial Disease Assessment (using hand held doppler) ASSESSMENTS - Ostomy and/or Continence Assessment and Care []  - 0 Incontinence Assessment and Management []  - 0 Ostomy Care Assessment and Management (repouching, etc.) PROCESS - Coordination of Care X - Simple Patient / Family Education for ongoing care 1 15 []  - 0 Complex (extensive) Patient / Family Education for ongoing care X- 1 10 Staff obtains Chiropractor, Records, T Results / Process Orders est []  - 0 Staff telephones HHA, Nursing Homes / Clarify orders / etc []  - 0 Routine Transfer to another Facility (non-emergent condition) []  - 0 Routine Hospital Admission (non-emergent condition) []  - 0 New Admissions / Manufacturing engineer / Ordering NPWT Apligraf, etc. , []  - 0 Emergency Hospital Admission (emergent condition) X- 1 10 Simple Discharge Coordination []  - 0 Complex (extensive) Discharge Coordination PROCESS - Special Needs []  - 0 Pediatric / Minor Patient Management []  - 0 Isolation Patient  Management []  - 0 Hearing / Language / Visual special needs []  - 0 Assessment of Community assistance (transportation, D/C planning, etc.) []  - 0 Additional assistance / Altered mentation []  - 0 Support Surface(s)  Assessment (bed, cushion, seat, etc.) INTERVENTIONS - Wound Cleansing / Measurement X - Simple Wound Cleansing - one wound 1 5 []  - 0 Complex Wound Cleansing - multiple wounds X- 1 5 Wound Imaging (photographs - any number of wounds) []  - 0 Wound Tracing (instead of photographs) X- 1 5 Simple Wound Measurement - one wound []  - 0 Complex Wound Measurement - multiple wounds INTERVENTIONS - Wound Dressings X - Small Wound Dressing one or multiple wounds 1 10 []  - 0 Medium Wound Dressing one or multiple wounds []  - 0 Large Wound Dressing one or multiple wounds []  - 0 Application of Medications - topical []  - 0 Application of Medications - injection INTERVENTIONS - Miscellaneous []  - 0 External ear exam Raymond [name] ([id number]) [id number] Page 3 of 9 []  - 0 Specimen Collection (cultures, biopsies, blood, body fluids, etc.) []  - 0 Specimen(s) / Culture(s) sent or taken to Lab for analysis []  - 0 Patient Transfer (multiple staff / Michiel Sites Lift / Similar devices) []  - 0 Simple Staple / Suture removal (25 or less) []  - 0 Complex Staple / Suture removal (26 or more) []  - 0 Hypo / Hyperglycemic Management (close monitor of Blood Glucose) []  - 0 Ankle / Brachial Index (ABI) - do not check if billed separately X- 1 5 Vital Signs Has the patient been seen at the hospital within the last three years: Yes Total Score: 85 Level Of Care: New/Established - Level 3 Electronic Signature(s) Signed: 08/15/2022 12:39:04 PM By: Raymond [name] Entered By: Raymond [name] on 08/15/2022 11:08:56 -------------------------------------------------------------------------------- Encounter Discharge Information Details Patient Name: Date of  Service: Raymond Blood D. 08/15/2022 9:45 A M Medical Record Number: [id number] Patient Account Number: [id number] Date of Birth/Sex: Treating RN: 1965/03/26 (57 y.o. Raymond [name] Primary Care Carley Glendenning: Raymond [name] Other Clinician: Referring Roslind Michaux: Treating Reiko Vinje/Extender: Raymond [name] in Treatment: 5 Encounter Discharge Information Items Discharge Condition: Stable Ambulatory Status: Ambulatory Discharge Destination: Home Transportation: Private Auto Accompanied By: self Schedule Follow-up Appointment: Yes Clinical Summary of Care: Electronic Signature(s) Signed: 08/15/2022 11:09:59 AM By: Raymond [name] Entered By: Raymond [name] on 08/15/2022 11:09:59 Lower Extremity Assessment Details -------------------------------------------------------------------------------- Raymond [name] ([id number]) [id number] Page 4 of 9 Patient Name: Date of Service: Raymond [name] 08/15/2022 9:45 A M Medical Record Number: [id number] Patient Account Number: [id number] Date of Birth/Sex: Treating RN: 18-Sep-1965 (56 y.o. Raymond [name]) Raymond [name] Primary Care Dao Memmott: Raymond [name] Other Clinician: Referring Roshunda Keir: Treating Kavina Cantave/Extender: Valerie Salts Weeks in Treatment: 5 Edema Assessment Left: Right: Assessed: No No Edema: Yes Calf Left: Right: Point of Measurement: 37 cm From Medial Instep 50 cm Ankle Left: Right: Point of Measurement: 13 cm From Medial Instep 34 cm Vascular Assessment Left: Right: Pulses: Dorsalis Pedis Palpable: Yes Electronic Signature(s) Signed: 08/18/2022 3:42:23 PM By: Raymond Pax RN Entered By: Raymond [name] on 08/15/2022 10:23:07 -------------------------------------------------------------------------------- Multi Wound Chart Details Patient Name: Date of Service: Raymond Blood D. 08/15/2022 9:45 A M Medical Record Number: [id number] Patient  Account Number: [id number] Date of Birth/Sex: Treating RN: 04-06-1965 (56 y.o. Raymond [name] Primary Care Daelin Haste: Raymond [name] Other Clinician: Referring Jourdan Durbin: Treating Margert Edsall/Extender: Raymond [name] in Treatment: 5 Vital Signs Height(in): 74 Pulse(bpm): 93 Weight(lbs): 398 Blood Pressure(mmHg): 152/61 Body Mass Index(BMI): 51.1 Temperature(F): 98.5 Respiratory Rate(breaths/min): 18 [4:Photos:] [N/A:N/A] Right Amputation Site - Toe N/A N/A Wound Location: Surgical Injury N/A N/A Wounding Event: Raymond, [name] ([id number]) [id number] Page 5 of 9 Open  Surgical Wound N/A N/A Primary Etiology: Congestive Heart Failure, N/A N/A Comorbid History: Hypertension, Type II Diabetes, Neuropathy 06/09/2022 N/A N/A Date Acquired: 5 N/A N/A Weeks of Treatment: Open N/A N/A Wound Status: No N/A N/A Wound Recurrence: 1.6x0.5x0.5 N/A N/A Measurements L x W x D (cm) 0.628 N/A N/A A (cm) : rea 0.314 N/A N/A Volume (cm) : 61.90% N/A N/A % Reduction in Area: 92.90% N/A N/A % Reduction in Volume: Full Thickness Without Exposed N/A N/A Classification: Support Structures Medium N/A N/A Exudate Amount: Serosanguineous N/A N/A Exudate Type: red, brown N/A N/A Exudate Color: Small (1-33%) N/A N/A Granulation Amount: Red, Pink N/A N/A Granulation Quality: Large (67-100%) N/A N/A Necrotic Amount: Fat Layer (Subcutaneous Tissue): Yes N/A N/A Exposed Structures: Fascia: No Tendon: No Muscle: No Joint: No Bone: No Small (1-33%) N/A N/A Epithelialization: Treatment Notes Electronic Signature(s) Signed: 08/15/2022 11:06:38 AM By: Raymond [name] Entered By: Raymond [name] on 08/15/2022 11:06:38 -------------------------------------------------------------------------------- Multi-Disciplinary Care Plan Details Patient Name: Date of Service: Raymond [name], Raymond Niemann D. 08/15/2022 9:45 A M Medical Record Number:  [id number] Patient Account Number: [id number] Date of Birth/Sex: Treating RN: [birth year] (56 y.o. Raymond [name] Primary Care Righteous Claiborne: Raymond [name] Other Clinician: Referring Darious Rehman: Treating Karoline Fleer/Extender: Valerie Salts Weeks in Treatment: 5 Active Inactive Wound/Skin Impairment Nursing Diagnoses: Impaired tissue integrity Knowledge deficit related to ulceration/compromised skin integrity Goals: Patient/caregiver will verbalize understanding of skin care regimen Date Initiated: 07/08/2022 Date Inactivated: 08/01/2022 Target Resolution Date: 08/06/2022 Goal Status: Met Ulcer/skin breakdown will have a volume reduction of 30% by week 4 Date Initiated: 07/08/2022 Date Inactivated: 08/01/2022 Target Resolution Date: 08/06/2022 Goal Status: Met Ulcer/skin breakdown will have a volume reduction of 50% by week 8 Date Initiated: 07/08/2022 Date Inactivated: 08/08/2022 Target Resolution Date: 09/07/2022 Goal Status: Met DAMARIAN, PRIOLA ([id number]) [id number] Page 6 of 9 Ulcer/skin breakdown will have a volume reduction of 80% by week 12 Date Initiated: 07/08/2022 Target Resolution Date: 10/08/2022 Goal Status: Active Ulcer/skin breakdown will heal within 14 weeks Date Initiated: 07/08/2022 Target Resolution Date: 10/22/2022 Goal Status: Active Interventions: Assess patient/caregiver ability to obtain necessary supplies Assess patient/caregiver ability to perform ulcer/skin care regimen upon admission and as needed Assess ulceration(s) every visit Provide education on ulcer and skin care Screen for HBO Treatment Activities: Referred to DME Maisa Bedingfield for dressing supplies : 07/08/2022 Skin care regimen initiated : 07/08/2022 Topical wound management initiated : 07/08/2022 Notes: Electronic Signature(s) Signed: 08/15/2022 11:09:15 AM By: Raymond [name] Signed: 08/18/2022 3:42:23 PM By: Raymond Pax RN Entered By: Raymond [name] on 08/15/2022  11:09:15 -------------------------------------------------------------------------------- Pain Assessment Details Patient Name: Date of Service: Raymond Blood D. 08/15/2022 9:45 A M Medical Record Number: [id number] Patient Account Number: [id number] Date of Birth/Sex: Treating RN: 1965-08-10 (56 y.o. Raymond [name] Primary Care Gaius Ishaq: Raymond [name] Other Clinician: Referring Larcenia Holaday: Treating Riggins Cisek/Extender: Raymond [name] in Treatment: 5 Active Problems Location of Pain Severity and Description of Pain Patient Has Paino No Site Locations Pain Management and Medication Current Pain Management: Raymond, [name] ([id number]) [id number] Page 7 of 9 Electronic Signature(s) Signed: 08/18/2022 3:42:23 PM By: Raymond Pax RN Entered By: Raymond [name] on 08/15/2022 10:16:25 -------------------------------------------------------------------------------- Patient/Caregiver Education Details Patient Name: Date of Service: Raymond [name] 7/15/2024andnbsp9:45 A M Medical Record Number: [id number] Patient Account Number: [id number] Date of Birth/Gender: Treating RN: 30-Dec-1965 (56 y.o. Raymond [name] Primary Care Physician: Raymond [name] Other Clinician: Referring Physician: Treating Physician/Extender: Raymond [name] in Treatment: 5 Education  Assessment Education Provided To: Patient Education Topics Provided Wound/Skin Impairment: Handouts: Caring for Your Ulcer Methods: Explain/Verbal Responses: State content correctly Electronic Signature(s) Signed: 08/15/2022 12:39:04 PM By: Raymond [name] Entered By: Raymond [name] on 08/15/2022 11:09:23 -------------------------------------------------------------------------------- Wound Assessment Details Patient Name: Date of Service: Raymond Blood D. 08/15/2022 9:45 A M Medical Record Number: [id number] Patient Account Number:  [id number] Date of Birth/Sex: Treating RN: 1965/04/26 (56 y.o. 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 Wound Status: Open Wounding Event: Surgical Injury Comorbid Congestive Heart Failure, Hypertension, Type II Diabetes, Date Acquired: 06/09/2022 History: Neuropathy Weeks Of Treatment: 5 Raymond, [name] ([id number]) [id number] Page 8 of 9 Clustered Wound: No Photos Wound Measurements Length: (cm) 1.6 Width: (cm) 0.5 Depth: (cm) 0.5 Area: (cm) 0.628 Volume: (cm) 0.314 % Reduction in Area: 61.9% % Reduction in Volume: 92.9% Epithelialization: Small (1-33%) Tunneling: No Undermining: No Wound Description Classification: Full Thickness Without Exposed Support Structures Exudate Amount: Medium Exudate Type: Serosanguineous Exudate Color: red, brown Foul Odor After Cleansing: No Slough/Fibrino Yes Wound Bed Granulation Amount: Small (1-33%) Exposed Structure Granulation Quality: Red, Pink Fascia Exposed: No Necrotic Amount: Large (67-100%) Fat Layer (Subcutaneous Tissue) Exposed: Yes Tendon Exposed: No Muscle Exposed: No Joint Exposed: No Bone Exposed: No Treatment Notes Wound #4 (Amputation Site - Toe) Wound Laterality: Right Cleanser Soap and Water Discharge Instruction: Gently cleanse wound with antibacterial soap, rinse and pat dry prior to dressing wounds Peri-Wound Care Topical Primary Dressing Promogran Matrix 4.34 (in) Discharge Instruction: Moisten w/normal saline or sterile water; Cover wound as directed. Do not remove from wound bed. Secondary Dressing Gauze Discharge Instruction: As directed: dry, moistened with saline or moistened with Dakins Solution Secured With Medipore T - 47M  Medipore H Soft Cloth Surgical T ape ape, 2x2 (in/yd) Kerlix Roll Sterile or Non-Sterile 6-ply 4.5x4 (yd/yd) Discharge Instruction: Apply Kerlix as directed Compression Wrap Compression Stockings Add-Ons Electronic Signature(s) Signed: 08/18/2022 3:42:23 PM By: Raymond Pax RN Entered By: Raymond [name] on 08/15/2022 10:37:30 Raymond [name] ([id number]) [id number] Page 9 of 9 -------------------------------------------------------------------------------- Vitals Details Patient Name: Date of Service: Raymond [name]. 08/15/2022 9:45 A M Medical Record Number: [id number] Patient Account Number: [id number] Date of Birth/Sex: Treating RN: [birth year] (56 y.o. Raymond [name]) Raymond [name] Primary Care Josephyne Tarter: Raymond [name] Other Clinician: Referring Sammantha Mehlhaff: Treating Ismar Yabut/Extender: Valerie Salts Weeks in Treatment: 5 Vital Signs Time Taken: 10:15 Temperature (F): 98.5 Height (in): 74 Pulse (bpm): 93 Weight (lbs): 398 Respiratory Rate (breaths/min): 18 Body Mass Index (BMI): 51.1 Blood Pressure (mmHg): 152/61 Reference Range: 80 - 120 mg / dl Electronic Signature(s) Signed: 08/18/2022 3:42:23 PM By: Raymond Pax RN Entered By: Raymond [name] on 08/15/2022 10:16:17

## 2022-08-23 ENCOUNTER — Encounter: Payer: Medicare HMO | Admitting: Internal Medicine

## 2022-08-23 DIAGNOSIS — E11621 Type 2 diabetes mellitus with foot ulcer: Secondary | ICD-10-CM | POA: Diagnosis not present

## 2022-08-23 NOTE — Progress Notes (Signed)
AJAI, HARVILLE ([id number]) [id number] Page 1 of 9 Visit Report for 08/23/2022 Debridement Details Patient Name: Date of Service: Raymond [name] 08/23/2022 9:45 A M Medical Record Number: [id number] Patient Account Number: [id number] Date of Birth/Sex: Treating RN: Feb 21, 1965 (57 y.o. [name], [name] Primary Care Provider: Martie Round Other Clinician: Referring Provider: Treating Provider/Extender: Chauncey Mann, MICHA EL Loleta Books in Treatment: 6 Debridement Performed for Assessment: Wound #4 Right Amputation Site - Toe Performed By: Physician Maxwell Caul, MD Debridement Type: Debridement Level of Consciousness (Pre-procedure): Awake and Alert Pre-procedure Verification/Time Out Yes - 10:07 Taken: Start Time: 10:08 Percent of Wound Bed Debrided: 100% T Area Debrided (cm): otal 0.28 Tissue and other material debrided: Non-Viable, Slough, Slough Level: Non-Viable Tissue Debridement Description: Selective/Open Wound Instrument: Curette Bleeding: None End Time: 10:08 Procedural Pain: Insensate Post Procedural Pain: Insensate Response to Treatment: Procedure was tolerated well Level of Consciousness (Post- Awake and Alert procedure): Post Debridement Measurements of Total Wound Length: (cm) 1.2 Width: (cm) 0.3 Depth: (cm) 0.2 Volume: (cm) 0.057 Character of Wound/Ulcer Post Debridement: Improved Post Procedure Diagnosis Same as Pre-procedure Electronic Signature(s) Signed: 08/23/2022 3:20:29 PM By: Bonnell Public Signed: 08/23/2022 4:43:46 PM By: Baltazar Najjar MD Entered By: Bonnell Public on 08/23/2022 10:09:44 HPI Details -------------------------------------------------------------------------------- Raymond [name] ([id number]) [id number] Page 2 of 9 Patient Name: Date of Service: Raymond [name] 08/23/2022 9:45 A M Medical Record Number: [id number] Patient  Account Number: [id number] Date of Birth/Sex: Treating RN: 12/21/1965 (57 y.o. [name], [name] Primary Care Provider: Martie Round Other Clinician: Referring Provider: Treating Provider/Extender: RO BSO N, MICHA EL Loleta Books in Treatment: 6 History of Present Illness HPI Description: 09/05/17-He is seeing an initial evaluation for a left plantar foot ulcer. He has a remote history of left great toe amputation. He states that 4-6 weeks ago he noted callus formation and ulceration. He has not seen primary care regarding this. He is not currently on antibiotic therapy. He does not routinely follow with podiatry. He states diabetic foot wear will arrive early next week. The EHR shows an A1c of 9% approximate 4 months ago but he states he had one and primary care a few weeks ago but does not know the results. He is neuropathic and does not complain of any pain, he is currently wearing crocs. 09/12/17-he is here in follow up evaluation for left plantar foot ulcer. There is improvement in both appearance and measurement. We will continue with same treatment plan and he will follow up next week. 09/19/17 on evaluation today patient actually appears to be doing excellent in regard to his ulcer on the invitation site of his left great foot plantar aspect. He's been tolerating the dressing changes without complication. In fact with the Prisma and the current measures he has been shown signs of excellent improvement week by week up to this point. We have been to breeding the wound in this seems to have been of great benefit for him. Fortunately there is no evidence of infection. 09/26/17 on evaluation today patient appears to be doing rather well in regard to his wound. He did not know quite as much improvement this week as compared to last week. Nonetheless he still continues to show signs of improving to some degree. I do believe he may benefit from an offloading shoe. No fevers, chills,  nausea, or vomiting noted at this time. 10/03/17 on evaluation today patient actually appears to be doing much better in  regard to the amputation site plantar foot ulcer. Overall this appears significantly smaller even compared to previous. He's been tolerating the dressing changes without complication. He did get his diabetic shoes and I did have a look at them they appear to be fairly good. Nonetheless I do think that for the time being I would probably recommend he continue with the offloading shoe that we have been utilizing just due to the fact that with the dressing I don't know that his diabetic shoes are going to work as appropriately as far as not called an additional pressure and irritation to the area in question. He understands. 10/10/17 on evaluation today patient actually appears to be doing very well in regard to his plantar foot ulcer. He has been tolerating the dressing changes without complication. I do feel like he's making signs of good improvement and in fact of the wound bed appears to be better although it may not be significantly changed in size it appears healthier and I do believe is showing signs of improving. Nonetheless we gonna keep working towards healing as far as that is concerned. There is no evidence of infection. 10/17/17 on evaluation today patient actually appears to be doing poorly in regard to his plantar foot ulcer. Unfortunately other than just the area where the wound is itself there appears to be a blister that communicates with the wound unfortunately. This is more lateral to the wound itself and also to the distal point of the amputation site. There does not appear to be any evidence of cellulitis spreading at the foot but I do believe some of the drainage from the site itself is. When in nature. Nonetheless this blister area I think needs to be removed in order to allow for appropriate healing of the ulcer itself I think that is gonna be difficult for it to  heal otherwise. This was discussed with the patient today. 10/24/17 on evaluation today patient actually appears to be doing better compared to last week even post debridement. He has shown signs of improvement he did test positive for Staphylococcus aureus. With that being said he notes that he's not have any discomfort and in general he does feel like things seem to been doing better. Fortunately there's no additional blistering and no evidence of remaining infection at this time. No fevers, chills, nausea, or vomiting noted at this time. He has three days of antibiotic left. 10/31/17 on evaluation today patient actually appears to be doing very well in regard to his ulcer on the left foot. He has been tolerating the dressing changes without complication. With that being said fortunately there appears to be no evidence of infection I do think he's made progress compared to previous. No fevers, chills, nausea, or vomiting noted at this time. 11/14/17 on evaluation today patient actually appears to be doing excellent in regard to his amputation site ulcer on his foot. In fact this appears to be completely healed at this point. There is no evidence of infection nor underline abscess and I did thoroughly evaluate the periwound location. Overall I'm very pleased with how things appear. Readmission: 01-03-2022 upon evaluation today patient appears for reevaluation here in the clinic although it has been since 2019 that I last saw him. This again has been over 4 years. With that being said he is having an issue with a wound on his foot unfortunately. He has not had any recent x-rays he tells me at this point which is unfortunate as avoid definitely can  need to get something started that can be one of the primary items to get moving forward with. With that being said I also think were probably can obtain a wound culture he is probably going to require some antibiotics at some point. I just want to make sure  that we have him on the right thing when we do this. Currently the patient tells me that his wounds have been present in regard to the met head for about a month at this point. With that being said he also has a wound on the third toe which is the next toe this actually still present. This unfortunately has a lot of callus buildup around it but I think it is larger underneath than what it would appear on initial inspection. Patient does have a history of diabetes mellitus type 2, congestive heart failure, and COPD. 01-11-2022 upon evaluation patient's wounds actually are showing signs of doing about the same may be just slightly cleaner but definitely not where we want things to be as of yet. Fortunately there does not appear to be any signs of active infection locally nor systemically at this point which is great news. No fevers, chills, nausea, vomiting, or diarrhea. Of note patient has had his MRI but we do not have the results of that as of yet we are still waiting for the reading on this at this point. 01-18-2022 upon evaluation today patient presents after having had his MRI finally complete. 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 The third and fourth digits seem to be early which is good news. Nonetheless we are still going to need to try to see about getting the infection under control he is already been on the antibiotic therapy which I think has done quite well. In fact I feel like he is showing signs of improvement already. 12/28; patient with a wound on the medial aspect of the left first metatarsal head and an area on the left third toe with slight hammer deformity. He has had a previous left first toe amputation. We are using Iodoflex on the wound. He is a diabetic a recent MRI showed osteomyelitis we have been giving him Cipro and Doxy which he appears to be tolerating well. 02-10-2022 upon evaluation today patient  unfortunately has issues still with open wounds of his foot on the left. He does have known osteomyelitis at this point and that is something that we have been treating with oral antibiotics. I actually have not seen him personally since 19 December. In that time he has not had any debridement from that point until today based on what I can see as best I can tell anyway. Has been on Cipro as well as doxycycline. Nonetheless he does still seem to have open wounds today and I think that he would benefit from hyperbaric oxygen therapy. I discussed that with him today as well. 02-18-2022 upon evaluation today patient appears to be doing about the same in regard to his wounds. Fortunately I do not see any signs of infection although DEMETRIO, LEIGHTY ([id number]) [id number] Page 3 of 9 he still has areas that seem to initially close down but that he has a lot of callus that still open at both locations most on the metatarsal region as well as the toe. I think that he is appropriate for proceeding with the hyperbarics and I think the sooner we get this done to try to  get these wounds healed the better off he will be. 02-25-2022 upon evaluation today patient's wounds do show signs of being dry get again. We were attempting to pack and some of the alginate dressing instead of doing the collagen as everything was getting very dry but nonetheless this is still continue to be an ongoing issue. My suggestion based on what I am seeing is good to be that we actually switch to doing Xeroform which should hopefully keep this a little bit more moist and from hopefully closing up prematurely. The patient voiced understanding. 03-03-2022 upon evaluation today patient appears to be doing well with regard to his wounds things as before appear to be getting dry. We are using Xeroform to try to keep it open is much as possible but each time I see him he does tend to cover over the good news is  each time he also seems to be getting a little bit smaller which is excellent. Fortunately I do not see any evidence of active infection locally nor systemically which is great news. I think this means that between the antibiotics and the hyperbarics were really on a good track here. 03-10-2022 upon evaluation today patient appears to be doing well currently in regard to his wounds. He is actually showing signs of some good improvement he is also tolerating HBO therapy very well. I do not see any evidence of active infection locally nor systemically which is great news and overall I am extremely pleased in that regard. No fevers, chills, nausea, vomiting, or diarrhea. 03-17-2022 upon evaluation today patient appears to be doing well currently in regard to his wounds in fact the toe is completely healed. The metatarsal head location though not completely healed appears to be doing much better which is great news and overall I am extremely pleased with where we stand today. 03-24-2022 upon evaluation today patient appears to be doing well currently in regard to his wounds. He has been tolerating the dressing changes without complication. Fortunately there does not appear to be any signs of active infection locally nor systemically in fact I am not so sure this wound is very close to being healed. I did perform some debridement to clearway some of the callus and the patient tolerated this today without complication. Postdebridement the wound bed is significantly improved. 03-31-2022 upon evaluation today patient appears to be doing well currently in regard to his wound which is actually showing signs of being completely healed. This is great news. Fortunately I do believe that the patient is doing much better he still on the oral antibiotics which I think is helping to treat the osteomyelitis is also undergoing hyperbaric oxygen therapy which I think is doing a really good job here as well. 04-21-2022 upon  evaluation today patient continues to remain completely closed. He seems to be doing excellent and is very close to completion of his 40 treatments of HBO therapy which that is the point where recommend discontinue therapy at that point. Fortunately I do not see any evidence of active infection locally nor systemically at this time which is great news. Readmission: 07-08-2022 unfortunately Mr. Pavon since I last saw him ended up having an issue with osteomyelitis of his great toe right side which included having to have amputation. This amputation was performed actually on 06-08-2022 and was again amputated secondary to the osteomyelitis. With that being said following however this has dehisced and therefore the patient was referred to [place] for further evaluation and treatment of  the dehisced surgical wound at the amputation site of the right great toe. His past medical history really has not changed significantly since I last saw him in the first of the year we will be doing hyperbarics for the left foot we are able to get all this healed that still is doing well according to what the patient tells me at this point. 07-15-2022 upon evaluation today patient appears to be doing well currently in regard to his wound. This in fact is showing signs of good improvement in my opinion. Fortunately I do not see any evidence of active infection locally nor systemically which is great news. No fevers, chills, nausea, vomiting, or diarrhea. 07-25-2022 upon evaluation today patient appears to be doing well currently in regard to his foot ulcer is actually measuring smaller and looking better I am actually pleased in that regard. We did get the results back from his pathology that showed necrotic bone but nothing that appears to be actually open at this point. Fortunately I do not see any signs of active infection at this time. 08-01-22 upon evaluation today patient appears to be doing well currently in regard to his  wound which is showing signs of improvement I think we may want to switch over to collagen however. 08-08-2022 upon evaluation patient's wound is actually showing signs of significant improvement. Fortunately I do not see any signs of active infection at this time which is great news and in general I do believe that we are making headway towards complete closure which is great news as well. 08-15-2022 upon evaluation today patient is making excellent progress towards closure. This wound looks to be doing excellent. Is not nearly as deep as what it was and I think that he is making great progress towards where we want to be. I am very pleased at this time. 7/23; this is a right second toe amputation site. We have been using Promogran change every 2 nd day Electronic Signature(s) Signed: 08/23/2022 4:43:46 PM By: Baltazar Najjar MD Entered By: Baltazar Najjar on 08/23/2022 10:17:25 -------------------------------------------------------------------------------- Physical Exam Details Patient Name: Date of Service: Raymond Blood D. 08/23/2022 9:45 A M Medical Record Number: [id number] Patient Account Number: [id number] Date of Birth/Sex: Treating RN: 07-24-1965 (57 y.o. [address] [place], [place] D ([id number]) [phone].pdf Page 4 of 9 Primary Care Provider: Martie Round Other Clinician: Referring Provider: Treating Provider/Extender: RO BSO N, MICHA EL Loleta Books in Treatment: 6 Constitutional Patient is hypertensive.. Pulse regular and within target range for patient.Marland Kitchen Respirations regular, non-labored and within target range.. Temperature is normal and within the target range for the patient.Marland Kitchen appears in no distress. Notes Wound exam; the patient's wound is on the dorsal right foot at the site of his second toe amputation he is already had a first toe amputation. The wound bed has quite a bit of slough relative to the surface area of a  small wound this does not probe deeply there is no undermining and no gross infection. I used a #3 curette to remove the slough. Minimal bleeding. Electronic Signature(s) Signed: 08/23/2022 4:43:46 PM By: Baltazar Najjar MD Entered By: Baltazar Najjar on 08/23/2022 10:19:33 -------------------------------------------------------------------------------- Physician Orders Details Patient Name: Date of Service: Raymond Blood D. 08/23/2022 9:45 A M Medical Record Number: [id number] Patient Account Number: [id number] Date of Birth/Sex: Treating RN: October 14, 1965 (57 y.o. [name], [name] Primary Care Provider: Martie Round Other Clinician: Referring Provider: Treating Provider/Extender: RO BSO Dorris Carnes, MICHA EL Rudy Jew  Weeks in Treatment: 6 Verbal / Phone Orders: No Diagnosis Coding Follow-up Appointments Return Appointment in 1 week. Bathing/ Shower/ Hygiene May shower; gently cleanse wound with antibacterial soap, rinse and pat dry prior to dressing wounds Off-Loading Open toe surgical shoe Wound Treatment Wound #4 - Amputation Site - Toe Wound Laterality: Right Cleanser: Soap and Water 3 x Per Week/30 Days Discharge Instructions: Gently cleanse wound with antibacterial soap, rinse and pat dry prior to dressing wounds Prim Dressing: Promogran Matrix 4.34 (in) 3 x Per Week/30 Days ary Discharge Instructions: Moisten with KY jelly Secondary Dressing: Gauze (Generic) 3 x Per Week/30 Days Discharge Instructions: As directed: dry, moistened with saline or moistened with Dakins Solution Secured With: Medipore T - 33M Medipore H Soft Cloth Surgical T ape ape, 2x2 (in/yd) (Generic) 3 x Per Week/30 Days Secured With: [employer] Sterile or Non-Sterile 6-ply 4.5x4 (yd/yd) (Generic) 3 x Per Week/30 Days Discharge Instructions: Apply Kerlix as directed Electronic Signature(s) Signed: 08/23/2022 3:20:29 PM By: Bonnell Public Signed: 08/23/2022 4:43:46 PM By: Baltazar Najjar MD Entered  By: Bonnell Public on 08/23/2022 10:10:29 Raymond [name] ([id number]) [id number] Page 5 of 9 -------------------------------------------------------------------------------- Problem List Details Patient Name: Date of Service: Raymond [name] 08/23/2022 9:45 A M Medical Record Number: [id number] Patient Account Number: [id number] Date of Birth/Sex: Treating RN: Aug 08, 1965 (57 y.o. [name], [name] Primary Care Provider: Martie Round Other Clinician: Referring Provider: Treating Provider/Extender: Chauncey Mann, MICHA EL Loleta Books in Treatment: 6 Active Problems ICD-10 Encounter Code Description Active Date MDM Diagnosis T81.31XA Disruption of external operation (surgical) wound, not elsewhere classified, 07/08/2022 No Yes initial encounter L97.512 Non-pressure chronic ulcer of other part of right foot with fat layer exposed 07/08/2022 No Yes E11.621 Type 2 diabetes mellitus with foot ulcer 07/08/2022 No Yes I50.42 Chronic combined systolic (congestive) and diastolic (congestive) heart failure 07/08/2022 No Yes J44.9 Chronic obstructive pulmonary disease, unspecified 07/08/2022 No Yes Inactive Problems Resolved Problems Electronic Signature(s) Signed: 08/23/2022 4:43:46 PM By: Baltazar Najjar MD Entered By: Baltazar Najjar on 08/23/2022 10:16:13 -------------------------------------------------------------------------------- Progress Note Details Patient Name: Date of Service: Raymond [name], Raymond Blalock M D. 08/23/2022 9:45 A Darrel Hoover ([id number]) [id number] Page 6 of 9 Medical Record Number: [id number] Patient Account Number: [id number] Date of Birth/Sex: Treating RN: 08/03/1965 (57 y.o. [name], [name] Primary Care Provider: Martie Round Other Clinician: Referring Provider: Treating Provider/Extender: RO BSO N, MICHA EL Loleta Books in Treatment: 6 Subjective History of Present  Illness (HPI) 09/05/17-He is seeing an initial evaluation for a left plantar foot ulcer. He has a remote history of left great toe amputation. He states that 4-6 weeks ago he noted callus formation and ulceration. He has not seen primary care regarding this. He is not currently on antibiotic therapy. He does not routinely follow with podiatry. He states diabetic foot wear will arrive early next week. The EHR shows an A1c of 9% approximate 4 months ago but he states he had one and primary care a few weeks ago but does not know the results. He is neuropathic and does not complain of any pain, he is currently wearing crocs. 09/12/17-he is here in follow up evaluation for left plantar foot ulcer. There is improvement in both appearance and measurement. We will continue with same treatment plan and he will follow up next week. 09/19/17 on evaluation today patient actually appears to be doing excellent in regard to his ulcer on the invitation site of his left great foot plantar  aspect. He's been tolerating the dressing changes without complication. In fact with the Prisma and the current measures he has been shown signs of excellent improvement week by week up to this point. We have been to breeding the wound in this seems to have been of great benefit for him. Fortunately there is no evidence of infection. 09/26/17 on evaluation today patient appears to be doing rather well in regard to his wound. He did not know quite as much improvement this week as compared to last week. Nonetheless he still continues to show signs of improving to some degree. I do believe he may benefit from an offloading shoe. No fevers, chills, nausea, or vomiting noted at this time. 10/03/17 on evaluation today patient actually appears to be doing much better in regard to the amputation site plantar foot ulcer. Overall this appears significantly smaller even compared to previous. He's been tolerating the dressing changes without  complication. He did get his diabetic shoes and I did have a look at them they appear to be fairly good. Nonetheless I do think that for the time being I would probably recommend he continue with the offloading shoe that we have been utilizing just due to the fact that with the dressing I don't know that his diabetic shoes are going to work as appropriately as far as not called an additional pressure and irritation to the area in question. He understands. 10/10/17 on evaluation today patient actually appears to be doing very well in regard to his plantar foot ulcer. He has been tolerating the dressing changes without complication. I do feel like he's making signs of good improvement and in fact of the wound bed appears to be better although it may not be significantly changed in size it appears healthier and I do believe is showing signs of improving. Nonetheless we gonna keep working towards healing as far as that is concerned. There is no evidence of infection. 10/17/17 on evaluation today patient actually appears to be doing poorly in regard to his plantar foot ulcer. Unfortunately other than just the area where the wound is itself there appears to be a blister that communicates with the wound unfortunately. This is more lateral to the wound itself and also to the distal point of the amputation site. There does not appear to be any evidence of cellulitis spreading at the foot but I do believe some of the drainage from the site itself is. When in nature. Nonetheless this blister area I think needs to be removed in order to allow for appropriate healing of the ulcer itself I think that is gonna be difficult for it to heal otherwise. This was discussed with the patient today. 10/24/17 on evaluation today patient actually appears to be doing better compared to last week even post debridement. He has shown signs of improvement he did test positive for Staphylococcus aureus. With that being said he notes  that he's not have any discomfort and in general he does feel like things seem to been doing better. Fortunately there's no additional blistering and no evidence of remaining infection at this time. No fevers, chills, nausea, or vomiting noted at this time. He has three days of antibiotic left. 10/31/17 on evaluation today patient actually appears to be doing very well in regard to his ulcer on the left foot. He has been tolerating the dressing changes without complication. With that being said fortunately there appears to be no evidence of infection I do think he's made progress compared to  previous. No fevers, chills, nausea, or vomiting noted at this time. 11/14/17 on evaluation today patient actually appears to be doing excellent in regard to his amputation site ulcer on his foot. In fact this appears to be completely healed at this point. There is no evidence of infection nor underline abscess and I did thoroughly evaluate the periwound location. Overall I'm very pleased with how things appear. Readmission: 01-03-2022 upon evaluation today patient appears for reevaluation here in the clinic although it has been since 2019 that I last saw him. This again has been over 4 years. With that being said he is having an issue with a wound on his foot unfortunately. He has not had any recent x-rays he tells me at this point which is unfortunate as avoid definitely can need to get something started that can be one of the primary items to get moving forward with. With that being said I also think were probably can obtain a wound culture he is probably going to require some antibiotics at some point. I just want to make sure that we have him on the right thing when we do this. Currently the patient tells me that his wounds have been present in regard to the met head for about a month at this point. With that being said he also has a wound on the third toe which is the next toe this actually still present. This  unfortunately has a lot of callus buildup around it but I think it is larger underneath than what it would appear on initial inspection. Patient does have a history of diabetes mellitus type 2, congestive heart failure, and COPD. 01-11-2022 upon evaluation patient's wounds actually are showing signs of doing about the same may be just slightly cleaner but definitely not where we want things to be as of yet. Fortunately there does not appear to be any signs of active infection locally nor systemically at this point which is great news. No fevers, chills, nausea, vomiting, or diarrhea. Of note patient has had his MRI but we do not have the results of that as of yet we are still waiting for the reading on this at this point. 01-18-2022 upon evaluation today patient presents after having had his MRI finally complete. 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 The third and fourth digits seem to be early which is good news. Nonetheless we are still going to need to try to see about getting the infection under control he is already been on the antibiotic therapy which I think has done quite well. In fact I feel like he is showing signs of improvement already. 12/28; patient with a wound on the medial aspect of the left first metatarsal head and an area on the left third toe with slight hammer deformity. He has had a previous left first toe amputation. We are using Iodoflex on the wound. He is a diabetic a recent MRI showed osteomyelitis we have been giving him Cipro and Doxy which he appears to be tolerating well. 02-10-2022 upon evaluation today patient unfortunately has issues still with open wounds of his foot on the left. He does have known osteomyelitis at this point and that is something that we have been treating with oral antibiotics. I actually have not seen him personally since 19 December. In that time he has not had any  debridement from that point until today based on what I can see as  best I can tell anyway. Has been on Cipro as well as doxycycline. Nonetheless he does still seem to have open wounds today and I think that he would benefit from hyperbaric oxygen therapy. I discussed that with him today as well. 02-18-2022 upon evaluation today patient appears to be doing about the same in regard to his wounds. Fortunately I do not see any signs of infection although he still has areas that seem to initially close down but that he has a lot of callus that still open at both locations most on the metatarsal region as well as the toe. I think that he is appropriate for proceeding with the hyperbarics and I think the sooner we get this done to try to get these wounds healed the better off he Raymond [name], Raymond [name] ([id number]) [id number] Page 7 of 9 will be. 02-25-2022 upon evaluation today patient's wounds do show signs of being dry get again. We were attempting to pack and some of the alginate dressing instead of doing the collagen as everything was getting very dry but nonetheless this is still continue to be an ongoing issue. My suggestion based on what I am seeing is good to be that we actually switch to doing Xeroform which should hopefully keep this a little bit more moist and from hopefully closing up prematurely. The patient voiced understanding. 03-03-2022 upon evaluation today patient appears to be doing well with regard to his wounds things as before appear to be getting dry. We are using Xeroform to try to keep it open is much as possible but each time I see him he does tend to cover over the good news is each time he also seems to be getting a little bit smaller which is excellent. Fortunately I do not see any evidence of active infection locally nor systemically which is great news. I think this means that between the antibiotics and the hyperbarics were really on a good track  here. 03-10-2022 upon evaluation today patient appears to be doing well currently in regard to his wounds. He is actually showing signs of some good improvement he is also tolerating HBO therapy very well. I do not see any evidence of active infection locally nor systemically which is great news and overall I am extremely pleased in that regard. No fevers, chills, nausea, vomiting, or diarrhea. 03-17-2022 upon evaluation today patient appears to be doing well currently in regard to his wounds in fact the toe is completely healed. The metatarsal head location though not completely healed appears to be doing much better which is great news and overall I am extremely pleased with where we stand today. 03-24-2022 upon evaluation today patient appears to be doing well currently in regard to his wounds. He has been tolerating the dressing changes without complication. Fortunately there does not appear to be any signs of active infection locally nor systemically in fact I am not so sure this wound is very close to being healed. I did perform some debridement to clearway some of the callus and the patient tolerated this today without complication. Postdebridement the wound bed is significantly improved. 03-31-2022 upon evaluation today patient appears to be doing well currently in regard to his wound which is actually showing signs of being completely healed. This is great news. Fortunately I do believe that the patient is doing much better he still on the oral antibiotics which I think is helping to treat the osteomyelitis is also undergoing hyperbaric oxygen therapy which I think is doing a  really good job here as well. 04-21-2022 upon evaluation today patient continues to remain completely closed. He seems to be doing excellent and is very close to completion of his 40 treatments of HBO therapy which that is the point where recommend discontinue therapy at that point. Fortunately I do not see any evidence of  active infection locally nor systemically at this time which is great news. Readmission: 07-08-2022 unfortunately Mr. Shepard since I last saw him ended up having an issue with osteomyelitis of his great toe right side which included having to have amputation. This amputation was performed actually on 06-08-2022 and was again amputated secondary to the osteomyelitis. With that being said following however this has dehisced and therefore the patient was referred to [place] for further evaluation and treatment of the dehisced surgical wound at the amputation site of the right great toe. His past medical history really has not changed significantly since I last saw him in the first of the year we will be doing hyperbarics for the left foot we are able to get all this healed that still is doing well according to what the patient tells me at this point. 07-15-2022 upon evaluation today patient appears to be doing well currently in regard to his wound. This in fact is showing signs of good improvement in my opinion. Fortunately I do not see any evidence of active infection locally nor systemically which is great news. No fevers, chills, nausea, vomiting, or diarrhea. 07-25-2022 upon evaluation today patient appears to be doing well currently in regard to his foot ulcer is actually measuring smaller and looking better I am actually pleased in that regard. We did get the results back from his pathology that showed necrotic bone but nothing that appears to be actually open at this point. Fortunately I do not see any signs of active infection at this time. 08-01-22 upon evaluation today patient appears to be doing well currently in regard to his wound which is showing signs of improvement I think we may want to switch over to collagen however. 08-08-2022 upon evaluation patient's wound is actually showing signs of significant improvement. Fortunately I do not see any signs of active infection at this time which is  great news and in general I do believe that we are making headway towards complete closure which is great news as well. 08-15-2022 upon evaluation today patient is making excellent progress towards closure. This wound looks to be doing excellent. Is not nearly as deep as what it was and I think that he is making great progress towards where we want to be. I am very pleased at this time. 7/23; this is a right second toe amputation site. We have been using Promogran change every 2 nd day Objective Constitutional Patient is hypertensive.. Pulse regular and within target range for patient.Marland Kitchen Respirations regular, non-labored and within target range.. Temperature is normal and within the target range for the patient.Marland Kitchen appears in no distress. Vitals Time Taken: 9:52 AM, Height: 74 in, Weight: 398 lbs, BMI: 51.1, Temperature: 98.4 F, Pulse: 96 bpm, Respiratory Rate: 20 breaths/min, Blood Pressure: 184/77 mmHg. General Notes: Wound exam; the patient's wound is on the dorsal right foot at the site of his second toe amputation he is already had a first toe amputation. The wound bed has quite a bit of slough relative to the surface area of a small wound this does not probe deeply there is no undermining and no gross infection. I used a #3 curette to remove  the slough. Minimal bleeding. Integumentary (Hair, Skin) Wound #4 status is Open. Original cause of wound was Surgical Injury. The date acquired was: 06/09/2022. The wound has been in treatment 6 weeks. The wound is located on the Right Amputation Site - T The wound measures 1.2cm length x 0.3cm width x 0.2cm depth; 0.283cm^2 area and 0.057cm^3 volume. There oe. is Fat Layer (Subcutaneous Tissue) exposed. There is a small amount of serosanguineous drainage noted. The wound margin is distinct with the outline attached to the wound base. There is small (1-33%) granulation within the wound bed. There is a large (67-100%) amount of necrotic tissue within the wound  bed. SHUBHAM, THACKSTON ([id number]) [id number] Page 8 of 9 Assessment Active Problems ICD-10 Disruption of external operation (surgical) wound, not elsewhere classified, initial encounter Non-pressure chronic ulcer of other part of right foot with fat layer exposed Type 2 diabetes mellitus with foot ulcer Chronic combined systolic (congestive) and diastolic (congestive) heart failure Chronic obstructive pulmonary disease, unspecified Procedures Wound #4 Pre-procedure diagnosis of Wound #4 is an Open Surgical Wound located on the Right Amputation Site - T . There was a Selective/Open Wound Non-Viable oe Tissue Debridement with a total area of 0.28 sq cm performed by Maxwell Caul, MD. With the following instrument(s): Curette to remove Non-Viable tissue/material. Material removed includes Slough. No specimens were taken. A time out was conducted at 10:07, prior to the start of the procedure. There was no bleeding. The procedure was tolerated well with a pain level of Insensate throughout and a pain level of Insensate following the procedure. Post Debridement Measurements: 1.2cm length x 0.3cm width x 0.2cm depth; 0.057cm^3 volume. Character of Wound/Ulcer Post Debridement is improved. Post procedure Diagnosis Wound #4: Same as Pre-Procedure Plan Follow-up Appointments: Return Appointment in 1 week. Bathing/ Shower/ Hygiene: May shower; gently cleanse wound with antibacterial soap, rinse and pat dry prior to dressing wounds Off-Loading: Open toe surgical shoe WOUND #4: - Amputation Site - T oe Wound Laterality: Right Cleanser: Soap and Water 3 x Per Week/30 Days Discharge Instructions: Gently cleanse wound with antibacterial soap, rinse and pat dry prior to dressing wounds Prim Dressing: Promogran Matrix 4.34 (in) 3 x Per Week/30 Days ary Discharge Instructions: Moisten with KY jelly Secondary Dressing: Gauze (Generic) 3 x Per Week/30  Days Discharge Instructions: As directed: dry, moistened with saline or moistened with Dakins Solution Secured With: Medipore T - 33M Medipore H Soft Cloth Surgical T ape ape, 2x2 (in/yd) (Generic) 3 x Per Week/30 Days Secured With: [employer] Sterile or Non-Sterile 6-ply 4.5x4 (yd/yd) (Generic) 3 x Per Week/30 Days Discharge Instructions: Apply Kerlix as directed 1. Debridement as noted 2. I think the collagen should continue although I recommended adding K-Y jelly and moistening this into a putty like consistency. Electronic Signature(s) Signed: 08/23/2022 4:43:46 PM By: Baltazar Najjar MD Entered By: Baltazar Najjar on 08/23/2022 10:20:15 -------------------------------------------------------------------------------- SuperBill Details Patient Name: Date of Service: Raymond [name], [name] 08/23/2022 Medical Record Number: [id number] Patient Account Number: [id number] Date of Birth/Sex: Treating RN: December 21, 1965 (57 y.o. [address], [place], [place] ([id number]) [phone].pdf Page 9 of 9 Primary Care Provider: Martie Round Other Clinician: Referring Provider: Treating Provider/Extender: Chauncey Mann, MICHA EL Loleta Books in Treatment: 6 Diagnosis Coding ICD-10 Codes Code Description T81.31XA Disruption of external operation (surgical) wound, not elsewhere classified, initial encounter L97.512 Non-pressure chronic ulcer of other part of right foot with fat layer exposed E11.621 Type 2 diabetes mellitus with foot ulcer I50.42  Chronic combined systolic (congestive) and diastolic (congestive) heart failure J44.9 Chronic obstructive pulmonary disease, unspecified Facility Procedures : CPT4 Code: [id number] Description: 47829 - DEBRIDE WOUND 1ST 20 SQ CM OR < ICD-10 Diagnosis Description L97.512 Non-pressure chronic ulcer of other part of right foot with fat layer exposed T81.31XA Disruption of external operation (surgical) wound, not elsewhere  classified,  initia Modifier: l encounter Quantity: 1 Physician Procedures : CPT4 Code Description Modifier [id number] - WC PHYS DEBR WO ANESTH 20 SQ CM ICD-10 Diagnosis Description L97.512 Non-pressure chronic ulcer of other part of right foot with fat layer exposed T81.31XA Disruption of external operation (surgical)  wound, not elsewhere classified, initial encounter Quantity: 1 Electronic Signature(s) Signed: 08/23/2022 4:43:46 PM By: Baltazar Najjar MD Entered By: Baltazar Najjar on 08/23/2022 10:20:31

## 2022-08-23 NOTE — Progress Notes (Signed)
SHARBEL, SAHAGUN ([id number]) [id number] Page 1 of 7 Visit Report for 08/23/2022 Arrival Information Details Patient Name: Date of Service: Raymond [name] 08/23/2022 9:45 A M Medical Record Number: [id number] Patient Account Number: [id number] Date of Birth/Sex: Treating RN: 08/08/1965 (57 y.o. [name], [name] Primary Care Prudy Candy: Martie Round Other Clinician: Referring Sovereign Ramiro: Treating Jahquez Steffler/Extender: Chauncey Mann, MICHA EL Loleta Books in Treatment: 6 Visit Information History Since Last Visit Added or deleted any medications: No Patient Arrived: Ambulatory Any new allergies or adverse reactions: No Arrival Time: 09:50 Had a fall or experienced change in No Accompanied By: self activities of daily living that may affect Transfer Assistance: None risk of falls: Patient Identification Verified: Yes Hospitalized since last visit: No Secondary Verification Process Completed: Yes Pain Present Now: No Patient Requires Transmission-Based Precautions: No Patient Has Alerts: Yes Patient Alerts: ABI R 1.37 L1.26 Electronic Signature(s) Signed: 08/23/2022 3:20:29 PM By: Bonnell Public Entered By: Bonnell Public on 08/23/2022 09:52:11 -------------------------------------------------------------------------------- Encounter Discharge Information Details Patient Name: Date of Service: Raymond Blood D. 08/23/2022 9:45 A M Medical Record Number: [id number] Patient Account Number: [id number] Date of Birth/Sex: Treating RN: [birth year] (57 y.o. [name], [name] Primary Care Marieliz Strang: Martie Round Other Clinician: Referring Eliyah Bazzi: Treating Solace Manwarren/Extender: RO BSO N, MICHA EL Loleta Books in Treatment: 6 Encounter Discharge Information Items Post Procedure Vitals Discharge Condition: Stable Temperature (F): 98.4 Ambulatory Status: Ambulatory Pulse (bpm): 96 Discharge Destination: Home Respiratory Rate  (breaths/min): 20 Transportation: Private Auto Blood Pressure (mmHg): 184/77 Accompanied By: self Schedule Follow-up Appointment: No Clinical Summary of Care: Electronic Signature(s) Signed: 08/23/2022 3:20:29 PM By: Glenice Bow ([id number]) [place], [name] [phone].pdf Page 2 of 7 Signed: 08/23/2022 3:20:29 PM By: Margaret Pyle ByBonnell Public on 08/23/2022 10:18:53 -------------------------------------------------------------------------------- Lower Extremity Assessment Details Patient Name: Date of Service: Raymond [name] 08/23/2022 9:45 A M Medical Record Number: [id number] Patient Account Number: [id number] Date of Birth/Sex: Treating RN: [birth year] (57 y.o. [name], [name] Primary Care Winford Hehn: Martie Round Other Clinician: Referring Dajuana Palen: Treating Diannia Hogenson/Extender: RO BSO N, MICHA EL Rudy Jew Weeks in Treatment: 6 Edema Assessment Assessed: [Left: No] [Right: No] Edema: [Left: Ye] [Right: s] Calf Left: Right: Point of Measurement: 33 cm From Medial Instep 53 cm Ankle Left: Right: Point of Measurement: 12 cm From Medial Instep 36 cm Vascular Assessment Pulses: Dorsalis Pedis Palpable: [Right:Yes] Electronic Signature(s) Signed: 08/23/2022 3:20:29 PM By: Bonnell Public Entered By: Bonnell Public on 08/23/2022 10:02:25 -------------------------------------------------------------------------------- Multi Wound Chart Details Patient Name: Date of Service: Raymond Blood D. 08/23/2022 9:45 A M Medical Record Number: [id number] Patient Account Number: [id number] Date of Birth/Sex: Treating RN: [birth year] (57 y.o. [name], [name] Primary Care Arnaldo Heffron: Martie Round Other Clinician: Referring Payzlee Ryder: Treating Hilaria Titsworth/Extender: RO BSO N, MICHA EL Rudy Jew Weeks in Treatment: 6 Vital Signs Height(in): 74 Pulse(bpm): [address] ([id number])  [id number] Page 3 of 7 Weight(lbs): 398 Blood Pressure(mmHg): 184/77 Body Mass Index(BMI): 51.1 Temperature(F): 98.4 Respiratory Rate(breaths/min): 20 [4:Photos:] [N/A:N/A] Right Amputation Site - T oe N/A N/A Wound Location: Surgical Injury N/A N/A Wounding Event: Open Surgical Wound N/A N/A Primary Etiology: Congestive Heart Failure, N/A N/A Comorbid History: Hypertension, Type II Diabetes, Neuropathy 06/09/2022 N/A N/A Date Acquired: 6 N/A N/A Weeks of Treatment: Open N/A N/A Wound Status: No N/A N/A Wound Recurrence: 1.2x0.3x0.2 N/A N/A Measurements L x W x D (cm) 0.283 N/A N/A A (cm) : rea 0.057 N/A N/A Volume (  cm) : 82.80% N/A N/A % Reduction in Area: 98.70% N/A N/A % Reduction in Volume: Full Thickness Without Exposed N/A N/A Classification: Support Structures Small N/A N/A Exudate Amount: Serosanguineous N/A N/A Exudate Type: red, brown N/A N/A Exudate Color: Distinct, outline attached N/A N/A Wound Margin: Small (1-33%) N/A N/A Granulation Amount: Large (67-100%) N/A N/A Necrotic Amount: Fat Layer (Subcutaneous Tissue): Yes N/A N/A Exposed Structures: Fascia: No Tendon: No Muscle: No Joint: No Bone: No Small (1-33%) N/A N/A Epithelialization: Treatment Notes Electronic Signature(s) Signed: 08/23/2022 3:20:29 PM By: Bonnell Public Entered By: Bonnell Public on 08/23/2022 10:04:33 -------------------------------------------------------------------------------- Multi-Disciplinary Care Plan Details Patient Name: Date of Service: [name], [name] 08/23/2022 9:45 A M Medical Record Number: [id number] Patient Account Number: [id number] Date of Birth/Sex: Treating RN: 06/11/1965 (57 y.o. [name], [name] Primary Care Shashana Fullington: Martie Round Other Clinician: Referring Nitza Schmid: Treating Gelila Well/Extender: Chauncey Mann, MICHA EL Loleta Books in Treatment: 6 BRECCAN, GALANT ([id number])  [id number] Page 4 of 7 Active Inactive Wound/Skin Impairment Nursing Diagnoses: Impaired tissue integrity Knowledge deficit related to ulceration/compromised skin integrity Goals: Patient/caregiver will verbalize understanding of skin care regimen Date Initiated: 07/08/2022 Date Inactivated: 08/01/2022 Target Resolution Date: 08/06/2022 Goal Status: Met Ulcer/skin breakdown will have a volume reduction of 30% by week 4 Date Initiated: 07/08/2022 Date Inactivated: 08/01/2022 Target Resolution Date: 08/06/2022 Goal Status: Met Ulcer/skin breakdown will have a volume reduction of 50% by week 8 Date Initiated: 07/08/2022 Date Inactivated: 08/08/2022 Target Resolution Date: 09/07/2022 Goal Status: Met Ulcer/skin breakdown will have a volume reduction of 80% by week 12 Date Initiated: 07/08/2022 Target Resolution Date: 10/08/2022 Goal Status: Active Ulcer/skin breakdown will heal within 14 weeks Date Initiated: 07/08/2022 Target Resolution Date: 10/22/2022 Goal Status: Active Interventions: Assess patient/caregiver ability to obtain necessary supplies Assess patient/caregiver ability to perform ulcer/skin care regimen upon admission and as needed Assess ulceration(s) every visit Provide education on ulcer and skin care Screen for HBO Treatment Activities: Referred to DME Lyza Houseworth for dressing supplies : 07/08/2022 Skin care regimen initiated : 07/08/2022 Topical wound management initiated : 07/08/2022 Notes: Electronic Signature(s) Signed: 08/23/2022 3:20:29 PM By: Bonnell Public Entered By: Bonnell Public on 08/23/2022 10:10:48 -------------------------------------------------------------------------------- Pain Assessment Details Patient Name: Date of Service: Raymond Blood D. 08/23/2022 9:45 A M Medical Record Number: [id number] Patient Account Number: [id number] Date of Birth/Sex: Treating RN: 1965-03-29 (57 y.o. [name], [name] Primary Care Trevin Gartrell: Martie Round  Other Clinician: Referring Naelani Lafrance: Treating Treva Huyett/Extender: RO BSO Dorris Carnes, MICHA EL Loleta Books in Treatment: 6 Active Problems Location of Pain Severity and Description of Pain Patient Has Paino No Site Locations [place] ([id number]) [phone].pdf Page 5 of 7 Pain Management and Medication Current Pain Management: Electronic Signature(s) Signed: 08/23/2022 3:20:29 PM By: Bonnell Public Entered By: Bonnell Public on 08/23/2022 09:54:44 -------------------------------------------------------------------------------- Patient/Caregiver Education Details Patient Name: Date of Service: Raymond [name] 7/23/2024andnbsp9:45 A M Medical Record Number: [id number] Patient Account Number: [id number] Date of Birth/Gender: Treating RN: Jan 26, 1966 (57 y.o. [name], [name] Primary Care Physician: Martie Round Other Clinician: Referring Physician: Treating Physician/Extender: RO BSO Dorris Carnes, MICHA EL Loleta Books in Treatment: 6 Education Assessment Education Provided To: Patient Education Topics Provided Wound Debridement: Handouts: Wound Debridement Methods: Explain/Verbal Responses: State content correctly Electronic Signature(s) Signed: 08/23/2022 3:20:29 PM By: Bonnell Public Entered By: Bonnell Public on 08/23/2022 10:18:01 Lucia Gaskins ([id number]) [id number] Page 6 of 7 -------------------------------------------------------------------------------- Wound Assessment Details Patient Name: Date of Service: [place], [facility]  M D. 08/23/2022 9:45 A M Medical Record Number: [id number] Patient Account Number: [id number] Date of Birth/Sex: Treating RN: 12/09/1965 (57 y.o. 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 Wound Status: Open Wounding Event: Surgical Injury Comorbid Congestive Heart Failure, Hypertension, Type II Diabetes, Date Acquired: 06/09/2022 History: Neuropathy Weeks Of Treatment: 6 Clustered Wound: No Photos Wound Measurements Length: (cm) 1.2 Width: (cm) 0.3 Depth: (cm) 0.2 Area: (cm) 0.283 Volume: (cm) 0.057 % Reduction in Area: 82.8% % Reduction in Volume: 98.7% Epithelialization: Small (1-33%) Wound Description Classification: Full Thickness Without Exposed Support Structures Wound Margin: Distinct, outline attached Exudate Amount: Small Exudate Type: Serosanguineous Exudate Color: red, brown Foul Odor After Cleansing: No Slough/Fibrino Yes Wound Bed Granulation Amount: Small (1-33%) Exposed Structure Necrotic Amount: Large (67-100%) Fascia Exposed: No Fat Layer (Subcutaneous Tissue) Exposed: Yes Tendon Exposed: No Muscle Exposed: No Joint Exposed: No Bone Exposed: No Treatment Notes Wound #4 (Amputation Site - Toe) Wound Laterality: Right Cleanser Soap and Water Discharge Instruction: Gently cleanse wound with antibacterial soap, rinse and pat dry prior to dressing wounds SHIHEEM, CORPORAN ([id number]) [id number] Page 7 of 7 Peri-Wound Care Topical Primary Dressing Promogran Matrix 4.34 (in) Discharge Instruction: Moisten with KY jelly Secondary Dressing Gauze Discharge Instruction: As directed: dry, moistened with saline or moistened with Dakins Solution Secured With Medipore T - 70M Medipore H Soft Cloth Surgical T ape ape, 2x2 (in/yd) Kerlix Roll Sterile or Non-Sterile 6-ply 4.5x4 (yd/yd) Discharge Instruction: Apply Kerlix as directed Compression Wrap Compression Stockings Add-Ons Electronic Signature(s) Signed: 08/23/2022 3:20:29 PM By: Bonnell Public Entered By: Bonnell Public on 08/23/2022  10:00:38 -------------------------------------------------------------------------------- Vitals Details Patient Name: Date of Service: Raymond Blood D. 08/23/2022 9:45 A M Medical Record Number: [id number] Patient Account Number: [id number] Date of Birth/Sex: Treating RN: 01-12-1966 (57 y.o. [name], [name] Primary Care Ahmar Pickrell: Martie Round Other Clinician: Referring Lorilee Cafarella: Treating Mikaella Escalona/Extender: RO BSO N, MICHA EL Loleta Books in Treatment: 6 Vital Signs Time Taken: 09:52 Temperature (F): 98.4 Height (in): 74 Pulse (bpm): 96 Weight (lbs): 398 Respiratory Rate (breaths/min): 20 Body Mass Index (BMI): 51.1 Blood Pressure (mmHg): 184/77 Reference Range: 80 - 120 mg / dl Electronic Signature(s) Signed: 08/23/2022 3:20:29 PM By: Bonnell Public Entered By: Bonnell Public on 08/23/2022 09:52:28

## 2022-08-30 ENCOUNTER — Ambulatory Visit: Payer: Medicare HMO | Admitting: Physician Assistant

## 2022-09-01 ENCOUNTER — Encounter: Payer: Medicare HMO | Attending: Physician Assistant | Admitting: Physician Assistant

## 2022-09-01 DIAGNOSIS — J449 Chronic obstructive pulmonary disease, unspecified: Secondary | ICD-10-CM | POA: Diagnosis not present

## 2022-09-01 DIAGNOSIS — I110 Hypertensive heart disease with heart failure: Secondary | ICD-10-CM | POA: Insufficient documentation

## 2022-09-01 DIAGNOSIS — L97512 Non-pressure chronic ulcer of other part of right foot with fat layer exposed: Secondary | ICD-10-CM | POA: Diagnosis not present

## 2022-09-01 DIAGNOSIS — T8131XA Disruption of external operation (surgical) wound, not elsewhere classified, initial encounter: Secondary | ICD-10-CM | POA: Diagnosis present

## 2022-09-01 DIAGNOSIS — I5042 Chronic combined systolic (congestive) and diastolic (congestive) heart failure: Secondary | ICD-10-CM | POA: Insufficient documentation

## 2022-09-01 DIAGNOSIS — Z89411 Acquired absence of right great toe: Secondary | ICD-10-CM | POA: Insufficient documentation

## 2022-09-01 DIAGNOSIS — X58XXXA Exposure to other specified factors, initial encounter: Secondary | ICD-10-CM | POA: Diagnosis not present

## 2022-09-01 DIAGNOSIS — E11621 Type 2 diabetes mellitus with foot ulcer: Secondary | ICD-10-CM | POA: Diagnosis not present

## 2022-09-01 DIAGNOSIS — Z89412 Acquired absence of left great toe: Secondary | ICD-10-CM | POA: Insufficient documentation

## 2022-09-01 NOTE — Progress Notes (Addendum)
Raymond [name] ([id number]) [id number] Page 1 of 10 Visit Report for 09/01/2022 Chief Complaint Document Details Patient Name: Date of Service: Raymond [name] 09/01/2022 8:15 A M Medical Record Number: [id number] Patient Account Number: [id number] Date of Birth/Sex: Treating RN: Jun 24, 1965 (57 y.o. [name] Primary Care Provider: Martie Round Other Clinician: Betha Loa Referring Provider: Treating Provider/Extender: Gwendlyn Deutscher in Treatment: 7 Information Obtained from: Patient Chief Complaint Left foot ulcer from surgical dehiscence following 2nd toe amputation right foot Electronic Signature(s) Signed: 09/01/2022 8:55:22 AM By: Allen Derry PA-C Entered By: Allen Derry on 09/01/2022 08:55:22 -------------------------------------------------------------------------------- Debridement Details Patient Name: Date of Service: Raymond Blood D. 09/01/2022 8:15 A M Medical Record Number: [id number] Patient Account Number: [id number] Date of Birth/Sex: Treating RN: Jul 29, 1965 (57 y.o. [name] Primary Care Provider: Martie Round Other Clinician: Betha Loa Referring Provider: Treating Provider/Extender: Gwendlyn Deutscher in Treatment: 7 Debridement Performed for Assessment: Wound #4 Right Amputation Site - Toe Performed By: Physician Allen Derry, PA-C Debridement Type: Debridement Level of Consciousness (Pre-procedure): Awake and Alert Pre-procedure Verification/Time Out Yes - 08:57 Taken: Start Time: 08:57 Percent of Wound Bed Debrided: 100% T Area Debrided (cm): otal 0.16 Tissue and other material debrided: Viable, Non-Viable, Callus, Slough, Subcutaneous, Slough Level: Skin/Subcutaneous Tissue Debridement Description: Excisional Instrument: Curette Bleeding: Minimum Hemostasis Achieved: Pressure Response to Treatment: Procedure was tolerated well Level of  Consciousness (Post- Awake and Alert procedure): Raymond [name], Raymond [name] ([id number]) [id number] Page 2 of 10 Post Debridement Measurements of Total Wound Length: (cm) 0.7 Width: (cm) 0.3 Depth: (cm) 0.3 Volume: (cm) 0.049 Character of Wound/Ulcer Post Debridement: Improved Post Procedure Diagnosis Same as Pre-procedure Electronic Signature(s) Signed: 09/01/2022 4:23:07 PM By: Angelina Pih Signed: 09/01/2022 4:48:59 PM By: Allen Derry PA-C Signed: 09/05/2022 11:56:54 AM By: Betha Loa Entered By: Betha Loa on 09/01/2022 09:01:09 -------------------------------------------------------------------------------- HPI Details Patient Name: Date of Service: Raymond Blood D. 09/01/2022 8:15 A M Medical Record Number: [id number] Patient Account Number: [id number] Date of Birth/Sex: Treating RN: 1965-03-30 (57 y.o. [name] Primary Care Provider: Martie Round Other Clinician: Betha Loa Referring Provider: Treating Provider/Extender: Valerie Salts Weeks in Treatment: 7 History of Present Illness HPI Description: 09/05/17-He is seeing an initial evaluation for a left plantar foot ulcer. He has a remote history of left great toe amputation. He states that 4-6 weeks ago he noted callus formation and ulceration. He has not seen primary care regarding this. He is not currently on antibiotic therapy. He does not routinely follow with podiatry. He states diabetic foot wear will arrive early next week. The EHR shows an A1c of 9% approximate 4 months ago but he states he had one and primary care a few weeks ago but does not know the results. He is neuropathic and does not complain of any pain, he is currently wearing crocs. 09/12/17-he is here in follow up evaluation for left plantar foot ulcer. There is improvement in both appearance and measurement. We will continue with same treatment plan and he will follow up next week. 09/19/17  on evaluation today patient actually appears to be doing excellent in regard to his ulcer on the invitation site of his left great foot plantar aspect. He's been tolerating the dressing changes without complication. In fact with the Prisma and the current measures he has been shown signs of excellent improvement week by week up to this point. We have been to breeding the wound in this seems  to have been of great benefit for him. Fortunately there is no evidence of infection. 09/26/17 on evaluation today patient appears to be doing rather well in regard to his wound. He did not know quite as much improvement this week as compared to last week. Nonetheless he still continues to show signs of improving to some degree. I do believe he may benefit from an offloading shoe. No fevers, chills, nausea, or vomiting noted at this time. 10/03/17 on evaluation today patient actually appears to be doing much better in regard to the amputation site plantar foot ulcer. Overall this appears significantly smaller even compared to previous. He's been tolerating the dressing changes without complication. He did get his diabetic shoes and I did have a look at them they appear to be fairly good. Nonetheless I do think that for the time being I would probably recommend he continue with the offloading shoe that we have been utilizing just due to the fact that with the dressing I don't know that his diabetic shoes are going to work as appropriately as far as not called an additional pressure and irritation to the area in question. He understands. 10/10/17 on evaluation today patient actually appears to be doing very well in regard to his plantar foot ulcer. He has been tolerating the dressing changes without complication. I do feel like he's making signs of good improvement and in fact of the wound bed appears to be better although it may not be significantly changed in size it appears healthier and I do believe is showing signs  of improving. Nonetheless we gonna keep working towards healing as far as that is concerned. There is no evidence of infection. 10/17/17 on evaluation today patient actually appears to be doing poorly in regard to his plantar foot ulcer. Unfortunately other than just the area where the wound is itself there appears to be a blister that communicates with the wound unfortunately. This is more lateral to the wound itself and also to the distal point of the amputation site. There does not appear to be any evidence of cellulitis spreading at the foot but I do believe some of the drainage from the site itself is. When in nature. Nonetheless this blister area I think needs to be removed in order to allow for appropriate healing of the ulcer itself I think that is gonna be difficult for it to heal otherwise. This was discussed with the patient today. 10/24/17 on evaluation today patient actually appears to be doing better compared to last week even post debridement. He has shown signs of improvement he did test positive for Staphylococcus aureus. With that being said he notes that he's not have any discomfort and in general he does feel like things seem to been doing better. Fortunately there's no additional blistering and no evidence of remaining infection at this time. No fevers, chills, nausea, or vomiting noted at this time. He has three days of antibiotic left. Raymond, [name] ([id number]) [id number] Page 3 of 10 10/31/17 on evaluation today patient actually appears to be doing very well in regard to his ulcer on the left foot. He has been tolerating the dressing changes without complication. With that being said fortunately there appears to be no evidence of infection I do think he's made progress compared to previous. No fevers, chills, nausea, or vomiting noted at this time. 11/14/17 on evaluation today patient actually appears to be doing excellent in regard to his  amputation site ulcer on his foot. In fact  this appears to be completely healed at this point. There is no evidence of infection nor underline abscess and I did thoroughly evaluate the periwound location. Overall I'm very pleased with how things appear. Readmission: 01-03-2022 upon evaluation today patient appears for reevaluation here in the clinic although it has been since 2019 that I last saw him. This again has been over 4 years. With that being said he is having an issue with a wound on his foot unfortunately. He has not had any recent x-rays he tells me at this point which is unfortunate as avoid definitely can need to get something started that can be one of the primary items to get moving forward with. With that being said I also think were probably can obtain a wound culture he is probably going to require some antibiotics at some point. I just want to make sure that we have him on the right thing when we do this. Currently the patient tells me that his wounds have been present in regard to the met head for about a month at this point. With that being said he also has a wound on the third toe which is the next toe this actually still present. This unfortunately has a lot of callus buildup around it but I think it is larger underneath than what it would appear on initial inspection. Patient does have a history of diabetes mellitus type 2, congestive heart failure, and COPD. 01-11-2022 upon evaluation patient's wounds actually are showing signs of doing about the same may be just slightly cleaner but definitely not where we want things to be as of yet. Fortunately there does not appear to be any signs of active infection locally nor systemically at this point which is great news. No fevers, chills, nausea, vomiting, or diarrhea. Of note patient has had his MRI but we do not have the results of that as of yet we are still waiting for the reading on this at this point. 01-18-2022 upon evaluation  today patient presents after having had his MRI finally complete. 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 The third and fourth digits seem to be early which is good news. Nonetheless we are still going to need to try to see about getting the infection under control he is already been on the antibiotic therapy which I think has done quite well. In fact I feel like he is showing signs of improvement already. 12/28; patient with a wound on the medial aspect of the left first metatarsal head and an area on the left third toe with slight hammer deformity. He has had a previous left first toe amputation. We are using Iodoflex on the wound. He is a diabetic a recent MRI showed osteomyelitis we have been giving him Cipro and Doxy which he appears to be tolerating well. 02-10-2022 upon evaluation today patient unfortunately has issues still with open wounds of his foot on the left. He does have known osteomyelitis at this point and that is something that we have been treating with oral antibiotics. I actually have not seen him personally since 19 December. In that time he has not had any debridement from that point until today based on what I can see as best I can tell anyway. Has been on Cipro as well as doxycycline. Nonetheless he does still seem to have open wounds today and I think that he would benefit from hyperbaric oxygen therapy. I  discussed that with him today as well. 02-18-2022 upon evaluation today patient appears to be doing about the same in regard to his wounds. Fortunately I do not see any signs of infection although he still has areas that seem to initially close down but that he has a lot of callus that still open at both locations most on the metatarsal region as well as the toe. I think that he is appropriate for proceeding with the hyperbarics and I think the sooner we get this done to try to get these wounds healed the  better off he will be. 02-25-2022 upon evaluation today patient's wounds do show signs of being dry get again. We were attempting to pack and some of the alginate dressing instead of doing the collagen as everything was getting very dry but nonetheless this is still continue to be an ongoing issue. My suggestion based on what I am seeing is good to be that we actually switch to doing Xeroform which should hopefully keep this a little bit more moist and from hopefully closing up prematurely. The patient voiced understanding. 03-03-2022 upon evaluation today patient appears to be doing well with regard to his wounds things as before appear to be getting dry. We are using Xeroform to try to keep it open is much as possible but each time I see him he does tend to cover over the good news is each time he also seems to be getting a little bit smaller which is excellent. Fortunately I do not see any evidence of active infection locally nor systemically which is great news. I think this means that between the antibiotics and the hyperbarics were really on a good track here. 03-10-2022 upon evaluation today patient appears to be doing well currently in regard to his wounds. He is actually showing signs of some good improvement he is also tolerating HBO therapy very well. I do not see any evidence of active infection locally nor systemically which is great news and overall I am extremely pleased in that regard. No fevers, chills, nausea, vomiting, or diarrhea. 03-17-2022 upon evaluation today patient appears to be doing well currently in regard to his wounds in fact the toe is completely healed. The metatarsal head location though not completely healed appears to be doing much better which is great news and overall I am extremely pleased with where we stand today. 03-24-2022 upon evaluation today patient appears to be doing well currently in regard to his wounds. He has been tolerating the dressing changes  without complication. Fortunately there does not appear to be any signs of active infection locally nor systemically in fact I am not so sure this wound is very close to being healed. I did perform some debridement to clearway some of the callus and the patient tolerated this today without complication. Postdebridement the wound bed is significantly improved. 03-31-2022 upon evaluation today patient appears to be doing well currently in regard to his wound which is actually showing signs of being completely healed. This is great news. Fortunately I do believe that the patient is doing much better he still on the oral antibiotics which I think is helping to treat the osteomyelitis is also undergoing hyperbaric oxygen therapy which I think is doing a really good job here as well. 04-21-2022 upon evaluation today patient continues to remain completely closed. He seems to be doing excellent and is very close to completion of his 40 treatments of HBO therapy which that is the point where recommend discontinue  therapy at that point. Fortunately I do not see any evidence of active infection locally nor systemically at this time which is great news. Readmission: 07-08-2022 unfortunately Raymond [name] since I last saw him ended up having an issue with osteomyelitis of his great toe right side which included having to have amputation. This amputation was performed actually on 06-08-2022 and was again amputated secondary to the osteomyelitis. With that being said following however this has dehisced and therefore the patient was referred to [place] for further evaluation and treatment of the dehisced surgical wound at the amputation site of the right great toe. His past medical history really has not changed significantly since I last saw him in the first of the year we will be doing hyperbarics for the left foot we are able to get all this healed that still is doing well according to what the patient tells me at this  point. 07-15-2022 upon evaluation today patient appears to be doing well currently in regard to his wound. This in fact is showing signs of good improvement in my opinion. Fortunately I do not see any evidence of active infection locally nor systemically which is great news. No fevers, chills, nausea, vomiting, or diarrhea. 07-25-2022 upon evaluation today patient appears to be doing well currently in regard to his foot ulcer is actually measuring smaller and looking better I am actually pleased in that regard. We did get the results back from his pathology that showed necrotic bone but nothing that appears to be actually open at this point. Fortunately I do not see any signs of active infection at this time. Raymond [name], Raymond [name] ([id number]) [id number] Page 4 of 10 08-01-22 upon evaluation today patient appears to be doing well currently in regard to his wound which is showing signs of improvement I think we may want to switch over to collagen however. 08-08-2022 upon evaluation patient's wound is actually showing signs of significant improvement. Fortunately I do not see any signs of active infection at this time which is great news and in general I do believe that we are making headway towards complete closure which is great news as well. 08-15-2022 upon evaluation today patient is making excellent progress towards closure. This wound looks to be doing excellent. Is not nearly as deep as what it was and I think that he is making great progress towards where we want to be. I am very pleased at this time. 7/23; this is a right second toe amputation site. We have been using Promogran change every 2 nd day 09-01-2022 upon evaluation today patient appears to be doing excellent in regard to his wound on the foot. He has been tolerating the dressing changes without complication and in general seems to be making really good progress towards complete closure. I do not see any signs of  infection which is great news. No fevers, chills, nausea, vomiting, or diarrhea. Electronic Signature(s) Signed: 09/01/2022 10:04:38 AM By: Allen Derry PA-C Entered By: Allen Derry on 09/01/2022 10:04:38 -------------------------------------------------------------------------------- Physical Exam Details Patient Name: Date of Service: Raymond Blood D. 09/01/2022 8:15 A M Medical Record Number: [id number] Patient Account Number: [id number] Date of Birth/Sex: Treating RN: April 20, 1965 (57 y.o. [name] Primary Care Provider: Martie Round Other Clinician: Betha Loa Referring Provider: Treating Provider/Extender: Valerie Salts Weeks in Treatment: 7 Constitutional Well-nourished and well-hydrated in no acute distress. Respiratory normal breathing without difficulty. Psychiatric this patient is able to make decisions and demonstrates good insight into disease process. Alert  and Oriented x 3. pleasant and cooperative. Notes Upon inspection patient's wound bed actually showed signs of good granulation epithelization at this point. Fortunately I think that he is really getting close to being completely done as far as the healing is concerned here I am very happy with what we are seeing. Electronic Signature(s) Signed: 09/01/2022 10:04:55 AM By: Allen Derry PA-C Entered By: Allen Derry on 09/01/2022 10:04:55 -------------------------------------------------------------------------------- Physician Orders Details Patient Name: Date of Service: Raymond [name], Raymond Niemann D. 09/01/2022 8:15 A Darrel Hoover ([id number]) [id number] Page 5 of 10 Medical Record Number: [id number] Patient Account Number: [id number] Date of Birth/Sex: Treating RN: 13-Jan-1966 (57 y.o. [name] Primary Care Provider: Martie Round Other Clinician: Betha Loa Referring Provider: Treating Provider/Extender: Gwendlyn Deutscher in Treatment: 7 Verbal / Phone Orders: Yes Clinician: Angelina Pih Read Back and Verified: Yes Diagnosis Coding ICD-10 Coding Code Description T81.31XA Disruption of external operation (surgical) wound, not elsewhere classified, initial encounter L97.512 Non-pressure chronic ulcer of other part of right foot with fat layer exposed E11.621 Type 2 diabetes mellitus with foot ulcer I50.42 Chronic combined systolic (congestive) and diastolic (congestive) heart failure J44.9 Chronic obstructive pulmonary disease, unspecified Follow-up Appointments Return Appointment in 1 week. Bathing/ Shower/ Hygiene May shower; gently cleanse wound with antibacterial soap, rinse and pat dry prior to dressing wounds Off-Loading Open toe surgical shoe Wound Treatment Wound #4 - Amputation Site - Toe Wound Laterality: Right Cleanser: Soap and Water 3 x Per Week/30 Days Discharge Instructions: Gently cleanse wound with antibacterial soap, rinse and pat dry prior to dressing wounds Prim Dressing: Promogran Matrix 4.34 (in) 3 x Per Week/30 Days ary Discharge Instructions: Moisten with KY jelly Secondary Dressing: Gauze (Generic) 3 x Per Week/30 Days Discharge Instructions: As directed: dry, moistened with saline or moistened with Dakins Solution Secured With: Medipore T - 59M Medipore H Soft Cloth Surgical T ape ape, 2x2 (in/yd) (Generic) 3 x Per Week/30 Days Secured With: [employer] Sterile or Non-Sterile 6-ply 4.5x4 (yd/yd) (Generic) 3 x Per Week/30 Days Discharge Instructions: Apply Kerlix as directed Electronic Signature(s) Signed: 09/01/2022 4:48:59 PM By: Allen Derry PA-C Signed: 09/05/2022 11:56:54 AM By: Betha Loa Entered By: Betha Loa on 09/01/2022 09:01:43 -------------------------------------------------------------------------------- Problem List Details Patient Name: Date of Service: Raymond Blood D. 09/01/2022 8:15 A M Medical Record Number: [id number] Patient  Account Number: [id number] Date of Birth/Sex: Treating RN: [birth year] (57 y.o. [name] Primary Care Provider: Martie Round Other Clinician: Betha Loa Referring Provider: Treating Provider/Extender: Gwendlyn Deutscher in Treatment: [address] 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 09/01/2022 8:15 A M Medical Record Number: [id number] Patient Account Number: [id number] Date of Birth/Sex: Treating RN: [birth year] (58 y.o. [name] Primary Care Provider: Martie Round Other Clinician: Betha Loa Referring Provider: Treating Provider/Extender: Alfredo Bach,  Lianne Moris in Treatment: 7 Subjective Chief Complaint Information obtained from Patient Left foot ulcer from surgical dehiscence following 2nd toe amputation right foot History of Present Illness (HPI) 09/05/17-He is seeing an initial evaluation for a left plantar foot ulcer. He has a remote history of left great toe amputation. He states that 4-6 weeks ago he noted callus formation and  ulceration. He has not seen primary care regarding this. He is not currently on antibiotic therapy. He does not routinely follow with podiatry. He states diabetic foot wear will arrive early next week. The EHR shows an A1c of 9% approximate 4 months ago but he states he had one and primary care a few weeks ago but does not know the results. He is neuropathic and does not complain of any pain, he is currently wearing crocs. 09/12/17-he is here in follow up evaluation for left plantar foot ulcer. There is improvement in both appearance and measurement. We will continue with same treatment plan and he will follow up next week. 09/19/17 on evaluation today patient actually appears to be doing excellent in regard to his ulcer on the invitation site of his left great foot plantar aspect. He's been tolerating the dressing changes without complication. In fact with the Prisma and the current measures he has been shown signs of excellent improvement week by week up to this point. We have been to breeding the wound in this seems to have been of great benefit for him. Fortunately there is no evidence of infection. 09/26/17 on evaluation today patient appears to be doing rather well in regard to his wound. He did not know quite as much improvement this week as compared to last week. Nonetheless he still continues to show signs of improving to some degree. I do believe he may benefit from an offloading shoe. No fevers, Raymond [name], Raymond [name] ([id number]) [id number] Page 7 of 10 chills, nausea, or vomiting noted at this time. 10/03/17 on evaluation today patient actually appears to be doing much better in regard to the amputation site plantar foot ulcer. Overall this appears significantly smaller even compared to previous. He's been tolerating the dressing changes without complication. He did get his diabetic shoes and I did have a look at them they appear to be fairly good. Nonetheless I do  think that for the time being I would probably recommend he continue with the offloading shoe that we have been utilizing just due to the fact that with the dressing I don't know that his diabetic shoes are going to work as appropriately as far as not called an additional pressure and irritation to the area in question. He understands. 10/10/17 on evaluation today patient actually appears to be doing very well in regard to his plantar foot ulcer. He has been tolerating the dressing changes without complication. I do feel like he's making signs of good improvement and in fact of the wound bed appears to be better although it may not be significantly changed in size it appears healthier and I do believe is showing signs of improving. Nonetheless we gonna keep working towards healing as far as that is concerned. There is no evidence of infection. 10/17/17 on evaluation today patient actually appears to be doing poorly in regard to his plantar foot ulcer. Unfortunately other than just the area where the wound is itself there appears to be a blister that communicates with the wound unfortunately. This is more lateral to the wound itself and also to the distal point of the amputation  site. There does not appear to be any evidence of cellulitis spreading at the foot but I do believe some of the drainage from the site itself is. When in nature. Nonetheless this blister area I think needs to be removed in order to allow for appropriate healing of the ulcer itself I think that is gonna be difficult for it to heal otherwise. This was discussed with the patient today. 10/24/17 on evaluation today patient actually appears to be doing better compared to last week even post debridement. He has shown signs of improvement he did test positive for Staphylococcus aureus. With that being said he notes that he's not have any discomfort and in general he does feel like things seem to been doing better. Fortunately there's no  additional blistering and no evidence of remaining infection at this time. No fevers, chills, nausea, or vomiting noted at this time. He has three days of antibiotic left. 10/31/17 on evaluation today patient actually appears to be doing very well in regard to his ulcer on the left foot. He has been tolerating the dressing changes without complication. With that being said fortunately there appears to be no evidence of infection I do think he's made progress compared to previous. No fevers, chills, nausea, or vomiting noted at this time. 11/14/17 on evaluation today patient actually appears to be doing excellent in regard to his amputation site ulcer on his foot. In fact this appears to be completely healed at this point. There is no evidence of infection nor underline abscess and I did thoroughly evaluate the periwound location. Overall I'm very pleased with how things appear. Readmission: 01-03-2022 upon evaluation today patient appears for reevaluation here in the clinic although it has been since 2019 that I last saw him. This again has been over 4 years. With that being said he is having an issue with a wound on his foot unfortunately. He has not had any recent x-rays he tells me at this point which is unfortunate as avoid definitely can need to get something started that can be one of the primary items to get moving forward with. With that being said I also think were probably can obtain a wound culture he is probably going to require some antibiotics at some point. I just want to make sure that we have him on the right thing when we do this. Currently the patient tells me that his wounds have been present in regard to the met head for about a month at this point. With that being said he also has a wound on the third toe which is the next toe this actually still present. This unfortunately has a lot of callus buildup around it but I think it is larger underneath than what it would appear on  initial inspection. Patient does have a history of diabetes mellitus type 2, congestive heart failure, and COPD. 01-11-2022 upon evaluation patient's wounds actually are showing signs of doing about the same may be just slightly cleaner but definitely not where we want things to be as of yet. Fortunately there does not appear to be any signs of active infection locally nor systemically at this point which is great news. No fevers, chills, nausea, vomiting, or diarrhea. Of note patient has had his MRI but we do not have the results of that as of yet we are still waiting for the reading on this at this point. 01-18-2022 upon evaluation today patient presents after having had his MRI finally complete. It did show  that he has evidence of osteomyelitis of the first metatarsal head, third digit, and fourth digit. The third and fourth digits seem to be early which is good news. Nonetheless we are still going to need to try to see about getting the infection under control he is already been on the antibiotic therapy which I think has done quite well. In fact I feel like he is showing signs of improvement already. 12/28; patient with a wound on the medial aspect of the left first metatarsal head and an area on the left third toe with slight hammer deformity. He has had a previous left first toe amputation. We are using Iodoflex on the wound. He is a diabetic a recent MRI showed osteomyelitis we have been giving him Cipro and Doxy which he appears to be tolerating well. 02-10-2022 upon evaluation today patient unfortunately has issues still with open wounds of his foot on the left. He does have known osteomyelitis at this point and that is something that we have been treating with oral antibiotics. I actually have not seen him personally since 19 December. In that time he has not had any debridement from that point until today based on what I can see as best I can tell anyway. Has been on Cipro as well as  doxycycline. Nonetheless he does still seem to have open wounds today and I think that he would benefit from hyperbaric oxygen therapy. I discussed that with him today as well. 02-18-2022 upon evaluation today patient appears to be doing about the same in regard to his wounds. Fortunately I do not see any signs of infection although he still has areas that seem to initially close down but that he has a lot of callus that still open at both locations most on the metatarsal region as well as the toe. I think that he is appropriate for proceeding with the hyperbarics and I think the sooner we get this done to try to get these wounds healed the better off he will be. 02-25-2022 upon evaluation today patient's wounds do show signs of being dry get again. We were attempting to pack and some of the alginate dressing instead of doing the collagen as everything was getting very dry but nonetheless this is still continue to be an ongoing issue. My suggestion based on what I am seeing is good to be that we actually switch to doing Xeroform which should hopefully keep this a little bit more moist and from hopefully closing up prematurely. The patient voiced understanding. 03-03-2022 upon evaluation today patient appears to be doing well with regard to his wounds things as before appear to be getting dry. We are using Xeroform to try to keep it open is much as possible but each time I see him he does tend to cover over the good news is each time he also seems to be getting a little bit smaller which is excellent. Fortunately I do not see any evidence of active infection locally nor systemically which is great news. I think this means that between the antibiotics and the hyperbarics were really on a good track here. 03-10-2022 upon evaluation today patient appears to be doing well currently in regard to his wounds. He is actually showing signs of some good improvement he is also tolerating HBO therapy very well. I do not  see any evidence of active infection locally nor systemically which is great news and overall I am extremely pleased in that regard. No fevers, chills, nausea, vomiting, or  diarrhea. 03-17-2022 upon evaluation today patient appears to be doing well currently in regard to his wounds in fact the toe is completely healed. The metatarsal head location though not completely healed appears to be doing much better which is great news and overall I am extremely pleased with where we stand today. 03-24-2022 upon evaluation today patient appears to be doing well currently in regard to his wounds. He has been tolerating the dressing changes without complication. Fortunately there does not appear to be any signs of active infection locally nor systemically in fact I am not so sure this wound is very close to being healed. I did perform some debridement to clearway some of the callus and the patient tolerated this today without complication. Postdebridement the wound bed is significantly improved. 03-31-2022 upon evaluation today patient appears to be doing well currently in regard to his wound which is actually showing signs of being completely healed. Raymond [name], Raymond [name] ([id number]) [id number] Page 8 of 10 This is great news. Fortunately I do believe that the patient is doing much better he still on the oral antibiotics which I think is helping to treat the osteomyelitis is also undergoing hyperbaric oxygen therapy which I think is doing a really good job here as well. 04-21-2022 upon evaluation today patient continues to remain completely closed. He seems to be doing excellent and is very close to completion of his 40 treatments of HBO therapy which that is the point where recommend discontinue therapy at that point. Fortunately I do not see any evidence of active infection locally nor systemically at this time which is great news. Readmission: 07-08-2022 unfortunately Raymond [name]  since I last saw him ended up having an issue with osteomyelitis of his great toe right side which included having to have amputation. This amputation was performed actually on 06-08-2022 and was again amputated secondary to the osteomyelitis. With that being said following however this has dehisced and therefore the patient was referred to [place] for further evaluation and treatment of the dehisced surgical wound at the amputation site of the right great toe. His past medical history really has not changed significantly since I last saw him in the first of the year we will be doing hyperbarics for the left foot we are able to get all this healed that still is doing well according to what the patient tells me at this point. 07-15-2022 upon evaluation today patient appears to be doing well currently in regard to his wound. This in fact is showing signs of good improvement in my opinion. Fortunately I do not see any evidence of active infection locally nor systemically which is great news. No fevers, chills, nausea, vomiting, or diarrhea. 07-25-2022 upon evaluation today patient appears to be doing well currently in regard to his foot ulcer is actually measuring smaller and looking better I am actually pleased in that regard. We did get the results back from his pathology that showed necrotic bone but nothing that appears to be actually open at this point. Fortunately I do not see any signs of active infection at this time. 08-01-22 upon evaluation today patient appears to be doing well currently in regard to his wound which is showing signs of improvement I think we may want to switch over to collagen however. 08-08-2022 upon evaluation patient's wound is actually showing signs of significant improvement. Fortunately I do not see any signs of active infection at this time which is great news and in general I do believe that we  are making headway towards complete closure which is great news as well. 08-15-2022 upon  evaluation today patient is making excellent progress towards closure. This wound looks to be doing excellent. Is not nearly as deep as what it was and I think that he is making great progress towards where we want to be. I am very pleased at this time. 7/23; this is a right second toe amputation site. We have been using Promogran change every 2 nd day 09-01-2022 upon evaluation today patient appears to be doing excellent in regard to his wound on the foot. He has been tolerating the dressing changes without complication and in general seems to be making really good progress towards complete closure. I do not see any signs of infection which is great news. No fevers, chills, nausea, vomiting, or diarrhea. Objective Constitutional Well-nourished and well-hydrated in no acute distress. Vitals Time Taken: 8:22 AM, Height: 74 in, Weight: 398 lbs, BMI: 51.1, Temperature: 98.2 F, Pulse: 89 bpm, Respiratory Rate: 18 breaths/min, Blood Pressure: 162/78 mmHg. Respiratory normal breathing without difficulty. Psychiatric this patient is able to make decisions and demonstrates good insight into disease process. Alert and Oriented x 3. pleasant and cooperative. General Notes: Upon inspection patient's wound bed actually showed signs of good granulation epithelization at this point. Fortunately I think that he is really getting close to being completely done as far as the healing is concerned here I am very happy with what we are seeing. Integumentary (Hair, Skin) Wound #4 status is Open. Original cause of wound was Surgical Injury. The date acquired was: 06/09/2022. The wound has been in treatment 7 weeks. The wound is located on the Right Amputation Site - T The wound measures 0.7cm length x 0.3cm width x 0.2cm depth; 0.165cm^2 area and 0.033cm^3 volume. There oe. is Fat Layer (Subcutaneous Tissue) exposed. There is a small amount of serosanguineous drainage noted. The wound margin is distinct with the  outline attached to the wound base. There is small (1-33%) granulation within the wound bed. There is a large (67-100%) amount of necrotic tissue within the wound bed. Assessment Active Problems ICD-10 Disruption of external operation (surgical) wound, not elsewhere classified, initial encounter Non-pressure chronic ulcer of other part of right foot with fat layer exposed Type 2 diabetes mellitus with foot ulcer Chronic combined systolic (congestive) and diastolic (congestive) heart failure Chronic obstructive pulmonary disease, unspecified Raymond [name], Raymond [name] ([id number]) [id number] Page 9 of 10 Procedures Wound #4 Pre-procedure diagnosis of Wound #4 is an Open Surgical Wound located on the Right Amputation Site - T . There was a Excisional Skin/Subcutaneous oe Tissue Debridement with a total area of 0.16 sq cm performed by Allen Derry, PA-C. With the following instrument(s): Curette to remove Viable and Non- Viable tissue/material. Material removed includes Callus, Subcutaneous Tissue, and Slough. A time out was conducted at 08:57, prior to the start of the procedure. A Minimum amount of bleeding was controlled with Pressure. The procedure was tolerated well. Post Debridement Measurements: 0.7cm length x 0.3cm width x 0.3cm depth; 0.049cm^3 volume. Character of Wound/Ulcer Post Debridement is improved. Post procedure Diagnosis Wound #4: Same as Pre-Procedure Plan Follow-up Appointments: Return Appointment in 1 week. Bathing/ Shower/ Hygiene: May shower; gently cleanse wound with antibacterial soap, rinse and pat dry prior to dressing wounds Off-Loading: Open toe surgical shoe WOUND #4: - Amputation Site - T oe Wound Laterality: Right Cleanser: Soap and Water 3 x Per Week/30 Days Discharge Instructions: Gently cleanse wound with antibacterial soap, rinse and pat dry  prior to dressing wounds Prim Dressing: Promogran Matrix 4.34 (in) 3 x Per Week/30  Days ary Discharge Instructions: Moisten with [place] jelly Secondary Dressing: Gauze (Generic) 3 x Per Week/30 Days Discharge Instructions: As directed: dry, moistened with saline or moistened with Dakins Solution Secured With: Medipore T - 21M Medipore H Soft Cloth Surgical T ape ape, 2x2 (in/yd) (Generic) 3 x Per Week/30 Days Secured With: [employer] Sterile or Non-Sterile 6-ply 4.5x4 (yd/yd) (Generic) 3 x Per Week/30 Days Discharge Instructions: Apply Kerlix as directed 1. I would recommend that we have the patient continue to monitor for any signs of infection or worsening. Based on what I am seeing I do think that we are in the right frame of mind as far as where things are going I would recommend to continue with the Promogran at this point. 2. I would recommend as well he continue with the gauze to cover followed by roll gauze to secure in place. We will see patient back for reevaluation in 1 week here in the clinic. If anything worsens or changes patient will contact our office for additional recommendations. Electronic Signature(s) Signed: 09/01/2022 10:05:18 AM By: Allen Derry PA-C Entered By: Allen Derry on 09/01/2022 10:05:18 -------------------------------------------------------------------------------- SuperBill Details Patient Name: Date of Service: Raymond [name], Raymond Niemann D. 09/01/2022 Medical Record Number: [id number] Patient Account Number: [id number] Date of Birth/Sex: Treating RN: 21-Mar-1965 (57 y.o. [name] Primary Care Provider: Martie Round Other Clinician: Betha Loa Referring Provider: Treating Provider/Extender: Valerie Salts Weeks in Treatment: 7 Diagnosis Coding ICD-10 Codes Code Description T81.31XA Disruption of external operation (surgical) wound, not elsewhere classified, initial encounter Raymond [name], Raymond [name] ([id number]) [id number] Page 10 of 10 [phone] Non-pressure chronic ulcer of other part of  right foot with fat layer exposed E11.621 Type 2 diabetes mellitus with foot ulcer I50.42 Chronic combined systolic (congestive) and diastolic (congestive) heart failure J44.9 Chronic obstructive pulmonary disease, unspecified Facility Procedures : CPT4 Code: [id number] Description: 11042 - DEB SUBQ TISSUE 20 SQ CM/< ICD-10 Diagnosis Description L97.512 Non-pressure chronic ulcer of other part of right foot with fat layer exposed Modifier: Quantity: 1 Physician Procedures : CPT4 Code Description Modifier [id number] 11042 - WC PHYS SUBQ TISS 20 SQ CM ICD-10 Diagnosis Description L97.512 Non-pressure chronic ulcer of other part of right foot with fat layer exposed Quantity: 1 Electronic Signature(s) Signed: 09/01/2022 10:05:26 AM By: Allen Derry PA-C Entered By: Allen Derry on 09/01/2022 10:05:26

## 2022-09-05 NOTE — Progress Notes (Signed)
THURLO, HOHLT ([id number]) [id number] Page 1 of 9 Visit Report for 09/01/2022 Arrival Information Details Patient Name: Date of Service: Raymond [name] 09/01/2022 8:15 A M Medical Record Number: [id number] Patient Account Number: [id number] Date of Birth/Sex: Treating RN: 01-03-1966 (57 y.o. Raymond [name] Primary Care : Martie Round Other Clinician: Betha Loa Referring : Treating /Extender: Gwendlyn Deutscher in Treatment: 7 Visit Information History Since Last Visit All ordered tests and consults were completed: No Patient Arrived: Ambulatory Added or deleted any medications: No Arrival Time: 08:21 Any new allergies or adverse reactions: No Transfer Assistance: None Had a fall or experienced change in No Patient Identification Verified: Yes activities of daily living that may affect Secondary Verification Process Completed: Yes risk of falls: Patient Requires Transmission-Based Precautions: No Signs or symptoms of abuse/neglect since No Patient Has Alerts: Yes last visito Patient Alerts: ABI R 1.37 L1.26 Hospitalized since last visit: No Implantable device outside of the clinic No excluding cellular tissue based products placed in the center since last visit: Has Dressing in Place as Prescribed: Yes Has Compression in Place as Prescribed: Yes Has Footwear/Offloading in Place as Yes Prescribed: Right: Surgical Shoe with Pressure Relief Insole Pain Present Now: No Electronic Signature(s) Signed: 09/05/2022 11:56:54 AM By: Betha Loa Entered By: Betha Loa on 09/01/2022 08:21:45 -------------------------------------------------------------------------------- Clinic Level of Care Assessment Details Patient Name: Date of Service: Raymond [name] 09/01/2022 8:15 A M Medical Record Number: [id number] Patient Account Number: [id number] Date of Birth/Sex: Treating  RN: 1965-05-04 (57 y.o. Raymond [name] Primary Care : Martie Round Other Clinician: Betha Loa Referring : Treating /Extender: Gwendlyn Deutscher in Treatment: 7 Clinic Level of Care Assessment Items JARIF, NONG ([id number]) [id number] Page 2 of 9 TOOL 1 Quantity Score []  - 0 Use when EandM and Procedure is performed on INITIAL visit ASSESSMENTS - Nursing Assessment / Reassessment []  - 0 General Physical Exam (combine w/ comprehensive assessment (listed just below) when performed on new pt. evals) []  - 0 Comprehensive Assessment (HX, ROS, Risk Assessments, Wounds Hx, etc.) ASSESSMENTS - Wound and Skin Assessment / Reassessment []  - 0 Dermatologic / Skin Assessment (not related to wound area) ASSESSMENTS - Ostomy and/or Continence Assessment and Care []  - 0 Incontinence Assessment and Management []  - 0 Ostomy Care Assessment and Management (repouching, etc.) PROCESS - Coordination of Care []  - 0 Simple Patient / Family Education for ongoing care []  - 0 Complex (extensive) Patient / Family Education for ongoing care []  - 0 Staff obtains Chiropractor, Records, T Results / Process Orders est []  - 0 Staff telephones HHA, Nursing Homes / Clarify orders / etc []  - 0 Routine Transfer to another Facility (non-emergent condition) []  - 0 Routine Hospital Admission (non-emergent condition) []  - 0 New Admissions / Manufacturing engineer / Ordering NPWT Apligraf, etc. , []  - 0 Emergency Hospital Admission (emergent condition) PROCESS - Special Needs []  - 0 Pediatric / Minor Patient Management []  - 0 Isolation Patient Management []  - 0 Hearing / Language / Visual special needs []  - 0 Assessment of Community assistance (transportation, D/C planning, etc.) []  - 0 Additional assistance / Altered mentation []  - 0 Support Surface(s) Assessment (bed, cushion, seat, etc.) INTERVENTIONS -  Miscellaneous []  - 0 External ear exam []  - 0 Patient Transfer (multiple staff / Nurse, adult / Similar devices) []  - 0 Simple Staple / Suture removal (25 or less) []  - 0 Complex Staple / Suture removal (26 or  more) []  - 0 Hypo/Hyperglycemic Management (do not check if billed separately) []  - 0 Ankle / Brachial Index (ABI) - do not check if billed separately Has the patient been seen at the hospital within the last three years: Yes Total Score: 0 Level Of Care: ____ Electronic Signature(s) Signed: 09/05/2022 11:56:54 AM By: Betha Loa Entered By: Betha Loa on 09/01/2022 09:01:51 Raymond [name] ([id number]) [id number] Page 3 of 9 -------------------------------------------------------------------------------- Encounter Discharge Information Details Patient Name: Date of Service: Raymond [name] 09/01/2022 8:15 A M Medical Record Number: [id number] Patient Account Number: [id number] Date of Birth/Sex: Treating RN: March 03, 1965 (57 y.o. Raymond [name] Primary Care : Martie Round Other Clinician: Betha Loa Referring : Treating /Extender: Gwendlyn Deutscher in Treatment: 7 Encounter Discharge Information Items Post Procedure Vitals Discharge Condition: Stable Temperature (F): 98.2 Ambulatory Status: Ambulatory Pulse (bpm): 89 Discharge Destination: Home Respiratory Rate (breaths/min): 18 Transportation: Other Blood Pressure (mmHg): 162/78 Accompanied By: self Schedule Follow-up Appointment: Yes Clinical Summary of Care: Electronic Signature(s) Signed: 09/05/2022 11:56:54 AM By: Betha Loa Entered By: Betha Loa on 09/01/2022 09:11:40 -------------------------------------------------------------------------------- Lower Extremity Assessment Details Patient Name: Date of Service: Raymond Blood D. 09/01/2022 8:15 A M Medical Record Number: [id number] Patient Account  Number: [id number] Date of Birth/Sex: Treating RN: Mar 10, 1965 (57 y.o. Raymond [name] Primary Care : Martie Round Other Clinician: Betha Loa Referring : Treating /Extender: Valerie Salts Weeks in Treatment: 7 Edema Assessment Assessed: [Left: No] [Right: Yes] Edema: [Left: Ye] [Right: s] Calf Left: Right: Point of Measurement: 33 cm From Medial Instep 55 cm Ankle Left: Right: Point of Measurement: 12 cm From Medial Instep 35 cm Vascular Assessment Pulses: Dorsalis Pedis Palpable: [Right:Yes] Toe Nail Assessment Left: Right: Thick: Yes Discolored: Yes Deformed: Yes Improper Length and Hygiene: No Raymond [name], Raymond [name] ([id number]) [id number] Page 4 of 9 Electronic Signature(s) Signed: 09/01/2022 4:23:07 PM By: Angelina Pih Signed: 09/05/2022 11:56:54 AM By: Betha Loa Entered By: Betha Loa on 09/01/2022 08:30:18 -------------------------------------------------------------------------------- Multi Wound Chart Details Patient Name: Date of Service: Raymond Blood D. 09/01/2022 8:15 A M Medical Record Number: [id number] Patient Account Number: [id number] Date of Birth/Sex: Treating RN: Dec 06, 1965 (57 y.o. Raymond [name] Primary Care : Martie Round Other Clinician: Betha Loa Referring : Treating /Extender: Valerie Salts Weeks in Treatment: 7 Vital Signs Height(in): 74 Pulse(bpm): 89 Weight(lbs): 398 Blood Pressure(mmHg): 162/78 Body Mass Index(BMI): 51.1 Temperature(F): 98.2 Respiratory Rate(breaths/min): 18 [4:Photos:] [N/A:N/A] Right Amputation Site - T oe N/A N/A Wound Location: Surgical Injury N/A N/A Wounding Event: Open Surgical Wound N/A N/A Primary Etiology: Congestive Heart Failure, N/A N/A Comorbid History: Hypertension, Type II Diabetes, Neuropathy 06/09/2022 N/A N/A Date Acquired: 7 N/A N/A Weeks of  Treatment: Open N/A N/A Wound Status: No N/A N/A Wound Recurrence: 0.7x0.3x0.2 N/A N/A Measurements L x W x D (cm) 0.165 N/A N/A A (cm) : rea 0.033 N/A N/A Volume (cm) : 90.00% N/A N/A % Reduction in Area: 99.30% N/A N/A % Reduction in Volume: Full Thickness Without Exposed N/A N/A Classification: Support Structures Small N/A N/A Exudate Amount: Serosanguineous N/A N/A Exudate Type: red, brown N/A N/A Exudate Color: Distinct, outline attached N/A N/A Wound Margin: Small (1-33%) N/A N/A Granulation Amount: Large (67-100%) N/A N/A Necrotic Amount: Fat Layer (Subcutaneous Tissue): Yes N/A N/A Exposed Structures: Fascia: No Tendon: No Muscle: No Joint: No Bone: No Small (1-33%) N/A N/A Epithelialization: Treatment Notes Raymond [name], Raymond [name] ([id number]) [phone].pdf Page 5 of 9 Electronic Signature(s)  Signed: 09/05/2022 11:56:54 AM By: Betha Loa Entered By: Betha Loa on 09/01/2022 08:30:31 -------------------------------------------------------------------------------- Multi-Disciplinary Care Plan Details Patient Name: Date of Service: Raymond [name], Raymond Niemann D. 09/01/2022 8:15 A M Medical Record Number: [id number] Patient Account Number: [id number] Date of Birth/Sex: Treating RN: 04-25-1965 (57 y.o. Raymond [name] Primary Care : Martie Round Other Clinician: Betha Loa Referring : Treating /Extender: Valerie Salts Weeks in Treatment: 7 Active Inactive Wound/Skin Impairment Nursing Diagnoses: Impaired tissue integrity Knowledge deficit related to ulceration/compromised skin integrity Goals: Patient/caregiver will verbalize understanding of skin care regimen Date Initiated: 07/08/2022 Date Inactivated: 08/01/2022 Target Resolution Date: 08/06/2022 Goal Status: Met Ulcer/skin breakdown will have a volume reduction of 30% by week 4 Date Initiated: 07/08/2022 Date Inactivated:  08/01/2022 Target Resolution Date: 08/06/2022 Goal Status: Met Ulcer/skin breakdown will have a volume reduction of 50% by week 8 Date Initiated: 07/08/2022 Date Inactivated: 08/08/2022 Target Resolution Date: 09/07/2022 Goal Status: Met Ulcer/skin breakdown will have a volume reduction of 80% by week 12 Date Initiated: 07/08/2022 Target Resolution Date: 10/08/2022 Goal Status: Active Ulcer/skin breakdown will heal within 14 weeks Date Initiated: 07/08/2022 Target Resolution Date: 10/22/2022 Goal Status: Active Interventions: Assess patient/caregiver ability to obtain necessary supplies Assess patient/caregiver ability to perform ulcer/skin care regimen upon admission and as needed Assess ulceration(s) every visit Provide education on ulcer and skin care Screen for HBO Treatment Activities: Referred to DME  for dressing supplies : 07/08/2022 Skin care regimen initiated : 07/08/2022 Topical wound management initiated : 07/08/2022 Notes: Electronic Signature(s) Signed: 09/01/2022 4:23:07 PM By: Angelina Pih Signed: 09/05/2022 11:56:54 AM By: Betha Loa Entered By: Betha Loa on 09/01/2022 09:02:05 Raymond [name] ([id number]) [id number] Page 6 of 9 -------------------------------------------------------------------------------- Pain Assessment Details Patient Name: Date of Service: Raymond [name] 09/01/2022 8:15 A M Medical Record Number: [id number] Patient Account Number: [id number] Date of Birth/Sex: Treating RN: [birth year] (57 y.o. Raymond [name] Primary Care : Martie Round Other Clinician: Betha Loa Referring : Treating /Extender: Valerie Salts Weeks in Treatment: 7 Active Problems Location of Pain Severity and Description of Pain Patient Has Paino No Site Locations Pain Management and Medication Current Pain Management: Electronic Signature(s) Signed: 09/01/2022 4:23:07 PM By:  Angelina Pih Signed: 09/05/2022 11:56:54 AM By: Betha Loa Entered By: Betha Loa on 09/01/2022 08:24:18 -------------------------------------------------------------------------------- Patient/Caregiver Education Details Patient Name: Date of Service: Raymond [name] 8/1/2024andnbsp8:15 A M Medical Record Number: [id number] Patient Account Number: [id number] Date of Birth/Gender: Treating RN: 09/06/1965 (57 y.o. Raymond [name] Primary Care Physician: Martie Round Other Clinician: Betha Loa Referring Physician: Treating Physician/Extender: Valerie Salts [place] ([id number]) [id number] Page 7 of 9 Weeks in Treatment: 7 Education Assessment Education Provided To: Patient Education Topics Provided Wound/Skin Impairment: Handouts: Other: continue wound care as directed Methods: Explain/Verbal Responses: State content correctly Electronic Signature(s) Signed: 09/05/2022 11:56:54 AM By: Betha Loa Entered By: Betha Loa on 09/01/2022 09:02:27 -------------------------------------------------------------------------------- Wound Assessment Details Patient Name: Date of Service: Raymond Blood D. 09/01/2022 8:15 A M Medical Record Number: [id number] Patient Account Number: [id number] Date of Birth/Sex: Treating RN: [birth year] (57 y.o. 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 Wound Status: Open Wounding Event: Surgical Injury Comorbid Congestive Heart Failure, Hypertension, Type II Diabetes, Date Acquired: 06/09/2022 History: Neuropathy Weeks Of  Treatment: 7 Clustered Wound: No Photos Wound Measurements Length: (cm) 0.7 Width:  (cm) 0.3 Depth: (cm) 0.2 Area: (cm) 0.165 Volume: (cm) 0.033 % Reduction in Area: 90% % Reduction in Volume: 99.3% Epithelialization: Small (1-33%) Wound Description Classification: Full Thickness Without Exposed Support Raymond [name], Raymond [name] ([id number]) Wound Margin: Distinct, outline attached Exudate Amount: Small Exudate Type: Serosanguineous Exudate Color: red, brown Structures Foul Odor After Cleansing: No [id number] Page 8 of 9 Slough/Fibrino Yes Wound Bed Granulation Amount: Small (1-33%) Exposed Structure Necrotic Amount: Large (67-100%) Fascia Exposed: No Fat Layer (Subcutaneous Tissue) Exposed: Yes Tendon Exposed: No Muscle Exposed: No Joint Exposed: No Bone Exposed: No Treatment Notes Wound #4 (Amputation Site - Toe) Wound Laterality: Right Cleanser Soap and Water Discharge Instruction: Gently cleanse wound with antibacterial soap, rinse and pat dry prior to dressing wounds Peri-Wound Care Topical Primary Dressing Promogran Matrix 4.34 (in) Discharge Instruction: Moisten with KY jelly Secondary Dressing Gauze Discharge Instruction: As directed: dry, moistened with saline or moistened with Dakins Solution Secured With Medipore T - 69M Medipore H Soft Cloth Surgical T ape ape, 2x2 (in/yd) Kerlix Roll Sterile or Non-Sterile 6-ply 4.5x4 (yd/yd) Discharge Instruction: Apply Kerlix as directed Compression Wrap Compression Stockings Add-Ons Electronic Signature(s) Signed: 09/01/2022 4:23:07 PM By: Angelina Pih Signed: 09/05/2022 11:56:54 AM By: Betha Loa Entered By: Betha Loa on 09/01/2022 08:28:55 -------------------------------------------------------------------------------- Vitals Details Patient Name: Date of Service: Raymond Blood D. 09/01/2022 8:15 A M Medical Record Number: [id number] Patient Account Number: [id number] Date of Birth/Sex: Treating RN: Feb 09, 1965 (57 y.o. Raymond [name] Primary Care  : Martie Round Other Clinician: Betha Loa Referring : Treating /Extender: Valerie Salts Weeks in Treatment: 7 Vital Signs Time Taken: 08:22 Temperature (F): 98.2 Raymond [name], Raymond [name] ([id number]) [id number] Page 9 of 9 Height (in): 74 Pulse (bpm): 89 Weight (lbs): 398 Respiratory Rate (breaths/min): 18 Body Mass Index (BMI): 51.1 Blood Pressure (mmHg): 162/78 Reference Range: 80 - 120 mg / dl Electronic Signature(s) Signed: 09/05/2022 11:56:54 AM By: Betha Loa Entered By: Betha Loa on 09/01/2022 08:24:13

## 2022-09-08 ENCOUNTER — Encounter: Payer: Medicare HMO | Admitting: Physician Assistant

## 2022-09-08 DIAGNOSIS — T8131XA Disruption of external operation (surgical) wound, not elsewhere classified, initial encounter: Secondary | ICD-10-CM | POA: Diagnosis not present

## 2022-09-08 NOTE — Progress Notes (Addendum)
JOVANNY, ODOWD ([id number]) [id number] Page 1 of 10 Visit Report for 09/08/2022 Chief Complaint Document Details Patient Name: Date of Service: [name] 09/08/2022 9:45 A M Medical Record Number: [id number] Patient Account Number: [id number] Date of Birth/Sex: Treating RN: [birth year] (57 y.o. [name] Primary Care Provider: Martie Round Other Clinician: Referring Provider: Treating Provider/Extender: Gwendlyn Deutscher in Treatment: 8 Information Obtained from: Patient Chief Complaint Left foot ulcer from surgical dehiscence following 2nd toe amputation right foot Electronic Signature(s) Signed: 09/08/2022 9:52:20 AM By: Allen Derry PA-C Entered By: Allen Derry on 09/08/2022 09:52:20 -------------------------------------------------------------------------------- Debridement Details Patient Name: Date of Service: [name] 09/08/2022 9:45 A M Medical Record Number: [id number] Patient Account Number: [id number] Date of Birth/Sex: Treating RN: 02-09-1965 (57 y.o. [name] Primary Care Provider: Martie Round Other Clinician: Referring Provider: Treating Provider/Extender: Gwendlyn Deutscher in Treatment: 8 Debridement Performed for Assessment: Wound #4 Right Amputation Site - Toe Performed By: Physician Allen Derry, PA-C Debridement Type: Debridement Level of Consciousness (Pre-procedure): Awake and Alert Pre-procedure Verification/Time Out Yes - 10:41 Taken: Pain Control: Lidocaine 4% T opical Solution Percent of Wound Bed Debrided: 100% T Area Debrided (cm): otal 0.16 Tissue and other material debrided: Viable, Non-Viable, Callus, Slough, Subcutaneous, Slough Level: Skin/Subcutaneous Tissue Debridement Description: Excisional Instrument: Curette Bleeding: Moderate Hemostasis Achieved: Pressure Response to Treatment: Procedure was tolerated well Level of  Consciousness (Post- Awake and Alert procedure): HARY, KIDO ([id number]) [id number] Page 2 of 10 Post Debridement Measurements of Total Wound Length: (cm) 0.7 Width: (cm) 0.3 Depth: (cm) 0.3 Volume: (cm) 0.049 Character of Wound/Ulcer Post Debridement: Stable Post Procedure Diagnosis Same as Pre-procedure Electronic Signature(s) Signed: 09/08/2022 2:59:41 PM By: Angelina Pih Signed: 09/08/2022 5:05:18 PM By: Allen Derry PA-C Entered By: Angelina Pih on 09/08/2022 10:42:26 -------------------------------------------------------------------------------- HPI Details Patient Name: Date of Service: [name] 09/08/2022 9:45 A M Medical Record Number: [id number] Patient Account Number: [id number] Date of Birth/Sex: Treating RN: [birth year] (57 y.o. [name] Primary Care Provider: Martie Round Other Clinician: Referring Provider: Treating Provider/Extender: Gwendlyn Deutscher in Treatment: 8 History of Present Illness HPI Description: 09/05/17-He is seeing an initial evaluation for a left plantar foot ulcer. He has a remote history of left great toe amputation. He states that 4-6 weeks ago he noted callus formation and ulceration. He has not seen primary care regarding this. He is not currently on antibiotic therapy. He does not routinely follow with podiatry. He states diabetic foot wear will arrive early next week. The EHR shows an A1c of 9% approximate 4 months ago but he states he had one and primary care a few weeks ago but does not know the results. He is neuropathic and does not complain of any pain, he is currently wearing crocs. 09/12/17-he is here in follow up evaluation for left plantar foot ulcer. There is improvement in both appearance and measurement. We will continue with same treatment plan and he will follow up next week. 09/19/17 on evaluation today patient actually appears to be doing excellent  in regard to his ulcer on the invitation site of his left great foot plantar aspect. He's been tolerating the dressing changes without complication. In fact with the Prisma and the current measures he has been shown signs of excellent improvement week by week up to this point. We have been to breeding the wound in this seems to have been of great benefit for him. Fortunately  there is no evidence of infection. 09/26/17 on evaluation today patient appears to be doing rather well in regard to his wound. He did not know quite as much improvement this week as compared to last week. Nonetheless he still continues to show signs of improving to some degree. I do believe he may benefit from an offloading shoe. No fevers, chills, nausea, or vomiting noted at this time. 10/03/17 on evaluation today patient actually appears to be doing much better in regard to the amputation site plantar foot ulcer. Overall this appears significantly smaller even compared to previous. He's been tolerating the dressing changes without complication. He did get his diabetic shoes and I did have a look at them they appear to be fairly good. Nonetheless I do think that for the time being I would probably recommend he continue with the offloading shoe that we have been utilizing just due to the fact that with the dressing I don't know that his diabetic shoes are going to work as appropriately as far as not called an additional pressure and irritation to the area in question. He understands. 10/10/17 on evaluation today patient actually appears to be doing very well in regard to his plantar foot ulcer. He has been tolerating the dressing changes without complication. I do feel like he's making signs of good improvement and in fact of the wound bed appears to be better although it may not be significantly changed in size it appears healthier and I do believe is showing signs of improving. Nonetheless we gonna keep working towards healing as  far as that is concerned. There is no evidence of infection. 10/17/17 on evaluation today patient actually appears to be doing poorly in regard to his plantar foot ulcer. Unfortunately other than just the area where the wound is itself there appears to be a blister that communicates with the wound unfortunately. This is more lateral to the wound itself and also to the distal point of the amputation site. There does not appear to be any evidence of cellulitis spreading at the foot but I do believe some of the drainage from the site itself is. When in nature. Nonetheless this blister area I think needs to be removed in order to allow for appropriate healing of the ulcer itself I think that is gonna be difficult for it to heal otherwise. This was discussed with the patient today. 10/24/17 on evaluation today patient actually appears to be doing better compared to last week even post debridement. He has shown signs of improvement he did test positive for Staphylococcus aureus. With that being said he notes that he's not have any discomfort and in general he does feel like things seem to been doing better. Fortunately there's no additional blistering and no evidence of remaining infection at this time. No fevers, chills, nausea, or vomiting noted at this time. He has three days of antibiotic left. 10/31/17 on evaluation today patient actually appears to be doing very well in regard to his ulcer on the left foot. He has been tolerating the dressing changes KHRISTOPHER, GONNERMAN ([id number]) [id number] Page 3 of 10 without complication. With that being said fortunately there appears to be no evidence of infection I do think he's made progress compared to previous. No fevers, chills, nausea, or vomiting noted at this time. 11/14/17 on evaluation today patient actually appears to be doing excellent in regard to his amputation site ulcer on his foot. In fact this appears to  be completely healed at this point.  There is no evidence of infection nor underline abscess and I did thoroughly evaluate the periwound location. Overall I'm very pleased with how things appear. Readmission: 01-03-2022 upon evaluation today patient appears for reevaluation here in the clinic although it has been since 2019 that I last saw him. This again has been over 4 years. With that being said he is having an issue with a wound on his foot unfortunately. He has not had any recent x-rays he tells me at this point which is unfortunate as avoid definitely can need to get something started that can be one of the primary items to get moving forward with. With that being said I also think were probably can obtain a wound culture he is probably going to require some antibiotics at some point. I just want to make sure that we have him on the right thing when we do this. Currently the patient tells me that his wounds have been present in regard to the met head for about a month at this point. With that being said he also has a wound on the third toe which is the next toe this actually still present. This unfortunately has a lot of callus buildup around it but I think it is larger underneath than what it would appear on initial inspection. Patient does have a history of diabetes mellitus type 2, congestive heart failure, and COPD. 01-11-2022 upon evaluation patient's wounds actually are showing signs of doing about the same may be just slightly cleaner but definitely not where we want things to be as of yet. Fortunately there does not appear to be any signs of active infection locally nor systemically at this point which is great news. No fevers, chills, nausea, vomiting, or diarrhea. Of note patient has had his MRI but we do not have the results of that as of yet we are still waiting for the reading on this at this point. 01-18-2022 upon evaluation today patient presents after having had his MRI finally  complete. 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 The third and fourth digits seem to be early which is good news. Nonetheless we are still going to need to try to see about getting the infection under control he is already been on the antibiotic therapy which I think has done quite well. In fact I feel like he is showing signs of improvement already. 12/28; patient with a wound on the medial aspect of the left first metatarsal head and an area on the left third toe with slight hammer deformity. He has had a previous left first toe amputation. We are using Iodoflex on the wound. He is a diabetic a recent MRI showed osteomyelitis we have been giving him Cipro and Doxy which he appears to be tolerating well. 02-10-2022 upon evaluation today patient unfortunately has issues still with open wounds of his foot on the left. He does have known osteomyelitis at this point and that is something that we have been treating with oral antibiotics. I actually have not seen him personally since 19 December. In that time he has not had any debridement from that point until today based on what I can see as best I can tell anyway. Has been on Cipro as well as doxycycline. Nonetheless he does still seem to have open wounds today and I think that he would benefit from hyperbaric oxygen therapy. I discussed that with him today as well. 02-18-2022 upon  evaluation today patient appears to be doing about the same in regard to his wounds. Fortunately I do not see any signs of infection although he still has areas that seem to initially close down but that he has a lot of callus that still open at both locations most on the metatarsal region as well as the toe. I think that he is appropriate for proceeding with the hyperbarics and I think the sooner we get this done to try to get these wounds healed the better off he will be. 02-25-2022 upon evaluation today  patient's wounds do show signs of being dry get again. We were attempting to pack and some of the alginate dressing instead of doing the collagen as everything was getting very dry but nonetheless this is still continue to be an ongoing issue. My suggestion based on what I am seeing is good to be that we actually switch to doing Xeroform which should hopefully keep this a little bit more moist and from hopefully closing up prematurely. The patient voiced understanding. 03-03-2022 upon evaluation today patient appears to be doing well with regard to his wounds things as before appear to be getting dry. We are using Xeroform to try to keep it open is much as possible but each time I see him he does tend to cover over the good news is each time he also seems to be getting a little bit smaller which is excellent. Fortunately I do not see any evidence of active infection locally nor systemically which is great news. I think this means that between the antibiotics and the hyperbarics were really on a good track here. 03-10-2022 upon evaluation today patient appears to be doing well currently in regard to his wounds. He is actually showing signs of some good improvement he is also tolerating HBO therapy very well. I do not see any evidence of active infection locally nor systemically which is great news and overall I am extremely pleased in that regard. No fevers, chills, nausea, vomiting, or diarrhea. 03-17-2022 upon evaluation today patient appears to be doing well currently in regard to his wounds in fact the toe is completely healed. The metatarsal head location though not completely healed appears to be doing much better which is great news and overall I am extremely pleased with where we stand today. 03-24-2022 upon evaluation today patient appears to be doing well currently in regard to his wounds. He has been tolerating the dressing changes without complication. Fortunately there does not appear to be any  signs of active infection locally nor systemically in fact I am not so sure this wound is very close to being healed. I did perform some debridement to clearway some of the callus and the patient tolerated this today without complication. Postdebridement the wound bed is significantly improved. 03-31-2022 upon evaluation today patient appears to be doing well currently in regard to his wound which is actually showing signs of being completely healed. This is great news. Fortunately I do believe that the patient is doing much better he still on the oral antibiotics which I think is helping to treat the osteomyelitis is also undergoing hyperbaric oxygen therapy which I think is doing a really good job here as well. 04-21-2022 upon evaluation today patient continues to remain completely closed. He seems to be doing excellent and is very close to completion of his 40 treatments of HBO therapy which that is the point where recommend discontinue therapy at that point. Fortunately I do not see  any evidence of active infection locally nor systemically at this time which is great news. Readmission: 07-08-2022 unfortunately Mr. Deshazer since I last saw him ended up having an issue with osteomyelitis of his great toe right side which included having to have amputation. This amputation was performed actually on 06-08-2022 and was again amputated secondary to the osteomyelitis. With that being said following however this has dehisced and therefore the patient was referred to [place] for further evaluation and treatment of the dehisced surgical wound at the amputation site of the right great toe. His past medical history really has not changed significantly since I last saw him in the first of the year we will be doing hyperbarics for the left foot we are able to get all this healed that still is doing well according to what the patient tells me at this point. 07-15-2022 upon evaluation today patient appears to be doing  well currently in regard to his wound. This in fact is showing signs of good improvement in my opinion. Fortunately I do not see any evidence of active infection locally nor systemically which is great news. No fevers, chills, nausea, vomiting, or diarrhea. 07-25-2022 upon evaluation today patient appears to be doing well currently in regard to his foot ulcer is actually measuring smaller and looking better I am actually pleased in that regard. We did get the results back from his pathology that showed necrotic bone but nothing that appears to be actually open at this point. Fortunately I do not see any signs of active infection at this time. KAYO, VIDALS ([id number]) [id number] Page 4 of 10 08-01-22 upon evaluation today patient appears to be doing well currently in regard to his wound which is showing signs of improvement I think we may want to switch over to collagen however. 08-08-2022 upon evaluation patient's wound is actually showing signs of significant improvement. Fortunately I do not see any signs of active infection at this time which is great news and in general I do believe that we are making headway towards complete closure which is great news as well. 08-15-2022 upon evaluation today patient is making excellent progress towards closure. This wound looks to be doing excellent. Is not nearly as deep as what it was and I think that he is making great progress towards where we want to be. I am very pleased at this time. 7/23; this is a right second toe amputation site. We have been using Promogran change every 2 nd day 09-01-2022 upon evaluation today patient appears to be doing excellent in regard to his wound on the foot. He has been tolerating the dressing changes without complication and in general seems to be making really good progress towards complete closure. I do not see any signs of infection which is great news. No fevers, chills, nausea, vomiting,  or diarrhea. 09-08-2022 upon evaluation today patient appears to be doing well currently in regard to his foot ulcer this is getting very close to complete resolution were not 100% there but again I am extremely pleased with where things stand currently. I do not see any evidence of active infection locally or systemically which is great news. No fevers, chills, nausea, vomiting, or diarrhea. Electronic Signature(s) Signed: 09/08/2022 4:39:19 PM By: Allen Derry PA-C Entered By: Allen Derry on 09/08/2022 16:39:19 -------------------------------------------------------------------------------- Physical Exam Details Patient Name: Date of Service: [name] 09/08/2022 9:45 A M Medical Record Number: [id number] Patient Account Number: [id number] Date of Birth/Sex: Treating RN: 1965/11/12 [id number]  y.o. Judie Petit) Angelina Pih Primary Care Provider: Martie Round Other Clinician: Referring Provider: Treating Provider/Extender: Valerie Salts Weeks in Treatment: 8 Constitutional Well-nourished and well-hydrated in no acute distress. Respiratory normal breathing without difficulty. Psychiatric this patient is able to make decisions and demonstrates good insight into disease process. Alert and Oriented x 3. pleasant and cooperative. Notes Upon inspection patient's wound bed actually showed signs of good granulation epithelization I did perform debridement clearway necrotic debris he tolerated that today without complication and postdebridement wound bed is significantly improved. Electronic Signature(s) Signed: 09/08/2022 4:39:37 PM By: Allen Derry PA-C Entered By: Allen Derry on 09/08/2022 16:39:37 Lucia Gaskins ([id number]) [id number] Page 5 of 10 -------------------------------------------------------------------------------- Physician Orders Details Patient Name: Date of Service: [name] 09/08/2022 9:45 A M Medical Record  Number: [id number] Patient Account Number: [id number] Date of Birth/Sex: Treating RN: 12-25-1965 (57 y.o. [name] Primary Care Provider: Martie Round Other Clinician: Referring Provider: Treating Provider/Extender: Gwendlyn Deutscher in Treatment: 8 Verbal / Phone Orders: No Diagnosis Coding ICD-10 Coding Code Description T81.31XA Disruption of external operation (surgical) wound, not elsewhere classified, initial encounter L97.512 Non-pressure chronic ulcer of other part of right foot with fat layer exposed E11.621 Type 2 diabetes mellitus with foot ulcer I50.42 Chronic combined systolic (congestive) and diastolic (congestive) heart failure J44.9 Chronic obstructive pulmonary disease, unspecified Follow-up Appointments Return Appointment in 1 week. Bathing/ Shower/ Hygiene May shower; gently cleanse wound with antibacterial soap, rinse and pat dry prior to dressing wounds Off-Loading Open toe surgical shoe Wound Treatment Wound #4 - Amputation Site - Toe Wound Laterality: Right Cleanser: Soap and Water 3 x Per Week/30 Days Discharge Instructions: Gently cleanse wound with antibacterial soap, rinse and pat dry prior to dressing wounds Prim Dressing: Promogran Matrix 4.34 (in) 3 x Per Week/30 Days ary Discharge Instructions: Moisten with KY jelly Secondary Dressing: Gauze (Generic) 3 x Per Week/30 Days Discharge Instructions: As directed: dry, moistened with saline or moistened with Dakins Solution Secured With: Medipore T - 7M Medipore H Soft Cloth Surgical T ape ape, 2x2 (in/yd) (Generic) 3 x Per Week/30 Days Secured With: [employer] Sterile or Non-Sterile 6-ply 4.5x4 (yd/yd) (Generic) 3 x Per Week/30 Days Discharge Instructions: Apply Kerlix as directed Electronic Signature(s) Signed: 09/08/2022 2:59:41 PM By: Angelina Pih Signed: 09/08/2022 5:05:18 PM By: Allen Derry PA-C Entered By: Angelina Pih on 09/08/2022  10:42:44 -------------------------------------------------------------------------------- Problem List Details Patient Name: Date of Service: [name] 09/08/2022 9:45 A M Medical Record Number: [id number] Patient Account Number: [id number] Date of Birth/Sex: Treating RN: 18-Feb-1965 (57 y.o. [name] Primary Care Provider: Martie Round Other Clinician: Referring Provider: Treating Provider/Extender: Gwendlyn Deutscher in Treatment: [address] ([id number]) [id number] Page 6 of 10 Active Problems ICD-10 Encounter Code Description Active Date MDM Diagnosis T81.31XA Disruption of external operation (surgical) wound, not elsewhere classified, 07/08/2022 No Yes initial encounter L97.512 Non-pressure chronic ulcer of other part of right foot with fat layer exposed 07/08/2022 No Yes E11.621 Type 2 diabetes mellitus with foot ulcer 07/08/2022 No Yes I50.42 Chronic combined systolic (congestive) and diastolic (congestive) heart failure 07/08/2022 No Yes J44.9 Chronic obstructive pulmonary disease, unspecified 07/08/2022 No Yes Inactive Problems Resolved Problems Electronic Signature(s) Signed: 09/08/2022 9:52:14 AM By: Allen Derry PA-C Entered By: Allen Derry on 09/08/2022 09:52:14 -------------------------------------------------------------------------------- Progress Note Details Patient Name: Date of Service: [name] 09/08/2022 9:45 A M Medical Record Number: [id number] Patient Account Number: [id number]  Date of Birth/Sex: Treating RN: [birth year] (57 y.o. [name] Primary Care Provider: Martie Round Other Clinician: Referring Provider: Treating Provider/Extender: Gwendlyn Deutscher in Treatment: 8 Subjective Chief Complaint Information obtained from Patient Left foot ulcer from surgical dehiscence following 2nd toe amputation right foot History of Present Illness  (HPI) 09/05/17-He is seeing an initial evaluation for a left plantar foot ulcer. He has a remote history of left great toe amputation. He states that 4-6 weeks ago he noted callus formation and ulceration. He has not seen primary care regarding this. He is not currently on antibiotic therapy. He does not routinely follow with podiatry. He states diabetic foot wear will arrive early next week. The EHR shows an A1c of 9% approximate 4 months ago but he states he had one and primary care a few weeks ago but does not know the results. He is neuropathic and does not complain of any pain, he is currently wearing crocs. 09/12/17-he is here in follow up evaluation for left plantar foot ulcer. There is improvement in both appearance and measurement. We will continue with same treatment plan and he will follow up next week. 09/19/17 on evaluation today patient actually appears to be doing excellent in regard to his ulcer on the invitation site of his left great foot plantar aspect. He's been tolerating the dressing changes without complication. In fact with the Prisma and the current measures he has been shown signs of excellent improvement week by week up to this point. We have been to breeding the wound in this seems to have been of great benefit for him. Fortunately there is no VALJEAN, OR ([id number]) [id number] Page 7 of 10 evidence of infection. 09/26/17 on evaluation today patient appears to be doing rather well in regard to his wound. He did not know quite as much improvement this week as compared to last week. Nonetheless he still continues to show signs of improving to some degree. I do believe he may benefit from an offloading shoe. No fevers, chills, nausea, or vomiting noted at this time. 10/03/17 on evaluation today patient actually appears to be doing much better in regard to the amputation site plantar foot ulcer. Overall this appears significantly smaller even  compared to previous. He's been tolerating the dressing changes without complication. He did get his diabetic shoes and I did have a look at them they appear to be fairly good. Nonetheless I do think that for the time being I would probably recommend he continue with the offloading shoe that we have been utilizing just due to the fact that with the dressing I don't know that his diabetic shoes are going to work as appropriately as far as not called an additional pressure and irritation to the area in question. He understands. 10/10/17 on evaluation today patient actually appears to be doing very well in regard to his plantar foot ulcer. He has been tolerating the dressing changes without complication. I do feel like he's making signs of good improvement and in fact of the wound bed appears to be better although it may not be significantly changed in size it appears healthier and I do believe is showing signs of improving. Nonetheless we gonna keep working towards healing as far as that is concerned. There is no evidence of infection. 10/17/17 on evaluation today patient actually appears to be doing poorly in regard to his plantar foot ulcer. Unfortunately other than just the area where the wound is itself there appears to be  a blister that communicates with the wound unfortunately. This is more lateral to the wound itself and also to the distal point of the amputation site. There does not appear to be any evidence of cellulitis spreading at the foot but I do believe some of the drainage from the site itself is. When in nature. Nonetheless this blister area I think needs to be removed in order to allow for appropriate healing of the ulcer itself I think that is gonna be difficult for it to heal otherwise. This was discussed with the patient today. 10/24/17 on evaluation today patient actually appears to be doing better compared to last week even post debridement. He has shown signs of improvement he did  test positive for Staphylococcus aureus. With that being said he notes that he's not have any discomfort and in general he does feel like things seem to been doing better. Fortunately there's no additional blistering and no evidence of remaining infection at this time. No fevers, chills, nausea, or vomiting noted at this time. He has three days of antibiotic left. 10/31/17 on evaluation today patient actually appears to be doing very well in regard to his ulcer on the left foot. He has been tolerating the dressing changes without complication. With that being said fortunately there appears to be no evidence of infection I do think he's made progress compared to previous. No fevers, chills, nausea, or vomiting noted at this time. 11/14/17 on evaluation today patient actually appears to be doing excellent in regard to his amputation site ulcer on his foot. In fact this appears to be completely healed at this point. There is no evidence of infection nor underline abscess and I did thoroughly evaluate the periwound location. Overall I'm very pleased with how things appear. Readmission: 01-03-2022 upon evaluation today patient appears for reevaluation here in the clinic although it has been since 2019 that I last saw him. This again has been over 4 years. With that being said he is having an issue with a wound on his foot unfortunately. He has not had any recent x-rays he tells me at this point which is unfortunate as avoid definitely can need to get something started that can be one of the primary items to get moving forward with. With that being said I also think were probably can obtain a wound culture he is probably going to require some antibiotics at some point. I just want to make sure that we have him on the right thing when we do this. Currently the patient tells me that his wounds have been present in regard to the met head for about a month at this point. With that being said he also has a wound  on the third toe which is the next toe this actually still present. This unfortunately has a lot of callus buildup around it but I think it is larger underneath than what it would appear on initial inspection. Patient does have a history of diabetes mellitus type 2, congestive heart failure, and COPD. 01-11-2022 upon evaluation patient's wounds actually are showing signs of doing about the same may be just slightly cleaner but definitely not where we want things to be as of yet. Fortunately there does not appear to be any signs of active infection locally nor systemically at this point which is great news. No fevers, chills, nausea, vomiting, or diarrhea. Of note patient has had his MRI but we do not have the results of that as of yet we are still  waiting for the reading on this at this point. 01-18-2022 upon evaluation today patient presents after having had his MRI finally complete. 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 The third and fourth digits seem to be early which is good news. Nonetheless we are still going to need to try to see about getting the infection under control he is already been on the antibiotic therapy which I think has done quite well. In fact I feel like he is showing signs of improvement already. 12/28; patient with a wound on the medial aspect of the left first metatarsal head and an area on the left third toe with slight hammer deformity. He has had a previous left first toe amputation. We are using Iodoflex on the wound. He is a diabetic a recent MRI showed osteomyelitis we have been giving him Cipro and Doxy which he appears to be tolerating well. 02-10-2022 upon evaluation today patient unfortunately has issues still with open wounds of his foot on the left. He does have known osteomyelitis at this point and that is something that we have been treating with oral antibiotics. I actually have not seen  him personally since 19 December. In that time he has not had any debridement from that point until today based on what I can see as best I can tell anyway. Has been on Cipro as well as doxycycline. Nonetheless he does still seem to have open wounds today and I think that he would benefit from hyperbaric oxygen therapy. I discussed that with him today as well. 02-18-2022 upon evaluation today patient appears to be doing about the same in regard to his wounds. Fortunately I do not see any signs of infection although he still has areas that seem to initially close down but that he has a lot of callus that still open at both locations most on the metatarsal region as well as the toe. I think that he is appropriate for proceeding with the hyperbarics and I think the sooner we get this done to try to get these wounds healed the better off he will be. 02-25-2022 upon evaluation today patient's wounds do show signs of being dry get again. We were attempting to pack and some of the alginate dressing instead of doing the collagen as everything was getting very dry but nonetheless this is still continue to be an ongoing issue. My suggestion based on what I am seeing is good to be that we actually switch to doing Xeroform which should hopefully keep this a little bit more moist and from hopefully closing up prematurely. The patient voiced understanding. 03-03-2022 upon evaluation today patient appears to be doing well with regard to his wounds things as before appear to be getting dry. We are using Xeroform to try to keep it open is much as possible but each time I see him he does tend to cover over the good news is each time he also seems to be getting a little bit smaller which is excellent. Fortunately I do not see any evidence of active infection locally nor systemically which is great news. I think this means that between the antibiotics and the hyperbarics were really on a good track here. 03-10-2022 upon  evaluation today patient appears to be doing well currently in regard to his wounds. He is actually showing signs of some good improvement he is also tolerating HBO therapy very well. I do not see any evidence  of active infection locally nor systemically which is great news and overall I am extremely pleased in that regard. No fevers, chills, nausea, vomiting, or diarrhea. 03-17-2022 upon evaluation today patient appears to be doing well currently in regard to his wounds in fact the toe is completely healed. The metatarsal head location though not completely healed appears to be doing much better which is great news and overall I am extremely pleased with where we stand today. 03-24-2022 upon evaluation today patient appears to be doing well currently in regard to his wounds. He has been tolerating the dressing changes without complication. Fortunately there does not appear to be any signs of active infection locally nor systemically in fact I am not so sure this wound is very close RAVION, PINCHBACK ([id number]) [id number] Page 8 of 10 to being healed. I did perform some debridement to clearway some of the callus and the patient tolerated this today without complication. Postdebridement the wound bed is significantly improved. 03-31-2022 upon evaluation today patient appears to be doing well currently in regard to his wound which is actually showing signs of being completely healed. This is great news. Fortunately I do believe that the patient is doing much better he still on the oral antibiotics which I think is helping to treat the osteomyelitis is also undergoing hyperbaric oxygen therapy which I think is doing a really good job here as well. 04-21-2022 upon evaluation today patient continues to remain completely closed. He seems to be doing excellent and is very close to completion of his 40 treatments of HBO therapy which that is the point where recommend discontinue  therapy at that point. Fortunately I do not see any evidence of active infection locally nor systemically at this time which is great news. Readmission: 07-08-2022 unfortunately Mr. Facemire since I last saw him ended up having an issue with osteomyelitis of his great toe right side which included having to have amputation. This amputation was performed actually on 06-08-2022 and was again amputated secondary to the osteomyelitis. With that being said following however this has dehisced and therefore the patient was referred to [place] for further evaluation and treatment of the dehisced surgical wound at the amputation site of the right great toe. His past medical history really has not changed significantly since I last saw him in the first of the year we will be doing hyperbarics for the left foot we are able to get all this healed that still is doing well according to what the patient tells me at this point. 07-15-2022 upon evaluation today patient appears to be doing well currently in regard to his wound. This in fact is showing signs of good improvement in my opinion. Fortunately I do not see any evidence of active infection locally nor systemically which is great news. No fevers, chills, nausea, vomiting, or diarrhea. 07-25-2022 upon evaluation today patient appears to be doing well currently in regard to his foot ulcer is actually measuring smaller and looking better I am actually pleased in that regard. We did get the results back from his pathology that showed necrotic bone but nothing that appears to be actually open at this point. Fortunately I do not see any signs of active infection at this time. 08-01-22 upon evaluation today patient appears to be doing well currently in regard to his wound which is showing signs of improvement I think we may want to switch over to collagen however. 08-08-2022 upon evaluation patient's wound is actually showing signs of significant improvement.  Fortunately I do  not see any signs of active infection at this time which is great news and in general I do believe that we are making headway towards complete closure which is great news as well. 08-15-2022 upon evaluation today patient is making excellent progress towards closure. This wound looks to be doing excellent. Is not nearly as deep as what it was and I think that he is making great progress towards where we want to be. I am very pleased at this time. 7/23; this is a right second toe amputation site. We have been using Promogran change every 2 nd day 09-01-2022 upon evaluation today patient appears to be doing excellent in regard to his wound on the foot. He has been tolerating the dressing changes without complication and in general seems to be making really good progress towards complete closure. I do not see any signs of infection which is great news. No fevers, chills, nausea, vomiting, or diarrhea. 09-08-2022 upon evaluation today patient appears to be doing well currently in regard to his foot ulcer this is getting very close to complete resolution were not 100% there but again I am extremely pleased with where things stand currently. I do not see any evidence of active infection locally or systemically which is great news. No fevers, chills, nausea, vomiting, or diarrhea. Objective Constitutional Well-nourished and well-hydrated in no acute distress. Vitals Time Taken: 9:52 AM, Height: 74 in, Weight: 398 lbs, BMI: 51.1, Temperature: 98 F, Pulse: 86 bpm, Respiratory Rate: 20 breaths/min, Blood Pressure: 166/84 mmHg. Respiratory normal breathing without difficulty. Psychiatric this patient is able to make decisions and demonstrates good insight into disease process. Alert and Oriented x 3. pleasant and cooperative. General Notes: Upon inspection patient's wound bed actually showed signs of good granulation epithelization I did perform debridement clearway necrotic debris he tolerated that today  without complication and postdebridement wound bed is significantly improved. Integumentary (Hair, Skin) Wound #4 status is Open. Original cause of wound was Surgical Injury. The date acquired was: 06/09/2022. The wound has been in treatment 8 weeks. The wound is located on the Right Amputation Site - T The wound measures 0.7cm length x 0.3cm width x 0.3cm depth; 0.165cm^2 area and 0.049cm^3 volume. There oe. is Fat Layer (Subcutaneous Tissue) exposed. There is no tunneling or undermining noted. There is a medium amount of serosanguineous drainage noted. The wound margin is distinct with the outline attached to the wound base. There is medium (34-66%) red granulation within the wound bed. There is a medium (34- 66%) amount of necrotic tissue within the wound bed including Adherent Slough. Assessment Active Problems ICD-10 PRATHEEK, RIDEOUT ([id number]) [id number] Page 9 of 10 Disruption of external operation (surgical) wound, not elsewhere classified, initial encounter Non-pressure chronic ulcer of other part of right foot with fat layer exposed Type 2 diabetes mellitus with foot ulcer Chronic combined systolic (congestive) and diastolic (congestive) heart failure Chronic obstructive pulmonary disease, unspecified Procedures Wound #4 Pre-procedure diagnosis of Wound #4 is an Open Surgical Wound located on the Right Amputation Site - T . There was a Excisional Skin/Subcutaneous oe Tissue Debridement with a total area of 0.16 sq cm performed by Allen Derry, PA-C. With the following instrument(s): Curette to remove Viable and Non- Viable tissue/material. Material removed includes Callus, Subcutaneous Tissue, and Slough after achieving pain control using Lidocaine 4% Topical Solution. No specimens were taken. A time out was conducted at 10:41, prior to the start of the procedure. A Moderate amount of bleeding was  controlled with Pressure. The procedure was tolerated  well. Post Debridement Measurements: 0.7cm length x 0.3cm width x 0.3cm depth; 0.049cm^3 volume. Character of Wound/Ulcer Post Debridement is stable. Post procedure Diagnosis Wound #4: Same as Pre-Procedure Plan Follow-up Appointments: Return Appointment in 1 week. Bathing/ Shower/ Hygiene: May shower; gently cleanse wound with antibacterial soap, rinse and pat dry prior to dressing wounds Off-Loading: Open toe surgical shoe WOUND #4: - Amputation Site - T oe Wound Laterality: Right Cleanser: Soap and Water 3 x Per Week/30 Days Discharge Instructions: Gently cleanse wound with antibacterial soap, rinse and pat dry prior to dressing wounds Prim Dressing: Promogran Matrix 4.34 (in) 3 x Per Week/30 Days ary Discharge Instructions: Moisten with [place] jelly Secondary Dressing: Gauze (Generic) 3 x Per Week/30 Days Discharge Instructions: As directed: dry, moistened with saline or moistened with Dakins Solution Secured With: Medipore T - 67M Medipore H Soft Cloth Surgical T ape ape, 2x2 (in/yd) (Generic) 3 x Per Week/30 Days Secured With: [employer] Sterile or Non-Sterile 6-ply 4.5x4 (yd/yd) (Generic) 3 x Per Week/30 Days Discharge Instructions: Apply Kerlix as directed 1. I am going to recommend that we have the patient continue with the collagen which I think is still going to be the best way to go currently. 2. I am also going to suggest that the patient should continue to monitor for any signs of worsening infection but based on what I am seeing currently I think he is really doing quite well. We will see patient back for reevaluation in 1 week here in the clinic. If anything worsens or changes patient will contact our office for additional recommendations. Electronic Signature(s) Signed: 09/08/2022 4:51:37 PM By: Allen Derry PA-C Entered By: Allen Derry on 09/08/2022 16:51:37 -------------------------------------------------------------------------------- SuperBill Details Patient Name:  Date of Service: [name], [name] 09/08/2022 Medical Record Number: [id number] Patient Account Number: [id number] Date of Birth/Sex: Treating RN: 1965-10-23 (57 y.o. [name] Primary Care Provider: Martie Round Other Clinician: Referring Provider: Treating Provider/Extender: Gwendlyn Deutscher in Treatment: [address] ([id number]) [id number] Page 10 of 10 Diagnosis Coding ICD-10 Codes Code Description T81.31XA Disruption of external operation (surgical) wound, not elsewhere classified, initial encounter L97.512 Non-pressure chronic ulcer of other part of right foot with fat layer exposed E11.621 Type 2 diabetes mellitus with foot ulcer I50.42 Chronic combined systolic (congestive) and diastolic (congestive) heart failure J44.9 Chronic obstructive pulmonary disease, unspecified Facility Procedures : CPT4 Code: [id number] Description: 11042 - DEB SUBQ TISSUE 20 SQ CM/< ICD-10 Diagnosis Description L97.512 Non-pressure chronic ulcer of other part of right foot with fat layer exposed Modifier: Quantity: 1 Physician Procedures : CPT4 Code Description Modifier [id number] 11042 - WC PHYS SUBQ TISS 20 SQ CM ICD-10 Diagnosis Description L97.512 Non-pressure chronic ulcer of other part of right foot with fat layer exposed Quantity: 1 Electronic Signature(s) Signed: 09/08/2022 4:51:52 PM By: Allen Derry PA-C Entered By: Allen Derry on 09/08/2022 16:51:52

## 2022-09-08 NOTE — Progress Notes (Addendum)
MATIS, DEMICK ([id number]) [id number] Page 1 of 9 Visit Report for 09/08/2022 Arrival Information Details Patient Name: Date of Service: Raymond [name] 09/08/2022 9:45 A M Medical Record Number: [id number] Patient Account Number: [id number] Date of Birth/Sex: Treating RN: 04-Nov-1965 (57 y.o. Raymond [name] Primary Care : Martie Round Other Clinician: Referring : Treating /Extender: Gwendlyn Deutscher in Treatment: 8 Visit Information History Since Last Visit Added or deleted any medications: No Patient Arrived: Ambulatory Any new allergies or adverse reactions: No Arrival Time: 09:51 Had a fall or experienced change in No Accompanied By: self activities of daily living that may affect Transfer Assistance: None risk of falls: Patient Requires Transmission-Based Precautions: No Hospitalized since last visit: No Patient Has Alerts: Yes Has Dressing in Place as Prescribed: Yes Patient Alerts: ABI R 1.37 L1.26 Has Footwear/Offloading in Place as Yes Prescribed: Right: Surgical Shoe with Pressure Relief Insole Pain Present Now: Yes Electronic Signature(s) Signed: 09/08/2022 2:59:41 PM By: Angelina Pih Entered By: Angelina Pih on 09/08/2022 09:52:53 -------------------------------------------------------------------------------- Clinic Level of Care Assessment Details Patient Name: Date of Service: Raymond [name] 09/08/2022 9:45 A M Medical Record Number: [id number] Patient Account Number: [id number] Date of Birth/Sex: Treating RN: [birth year] (57 y.o. Raymond [name] Primary Care : Martie Round Other Clinician: Referring : Treating /Extender: Gwendlyn Deutscher in Treatment: 8 Clinic Level of Care Assessment Items TOOL 1 Quantity Score []  - 0 Use when EandM and Procedure is performed on INITIAL visit ASSESSMENTS - Nursing Assessment  / Reassessment []  - 0 General Physical Exam (combine w/ comprehensive assessment (listed just below) when performed on new pt. evals) []  - 0 Comprehensive Assessment (HX, ROS, Risk Assessments, Wounds Hx, etc.) ASSESSMENTS - Wound and Skin Assessment / Reassessment Raymond [name], Raymond [name] ([id number]) [id number] Page 2 of 9 []  - 0 Dermatologic / Skin Assessment (not related to wound area) ASSESSMENTS - Ostomy and/or Continence Assessment and Care []  - 0 Incontinence Assessment and Management []  - 0 Ostomy Care Assessment and Management (repouching, etc.) PROCESS - Coordination of Care []  - 0 Simple Patient / Family Education for ongoing care []  - 0 Complex (extensive) Patient / Family Education for ongoing care []  - 0 Staff obtains Chiropractor, Records, T Results / Process Orders est []  - 0 Staff telephones HHA, Nursing Homes / Clarify orders / etc []  - 0 Routine Transfer to another Facility (non-emergent condition) []  - 0 Routine Hospital Admission (non-emergent condition) []  - 0 New Admissions / Manufacturing engineer / Ordering NPWT Apligraf, etc. , []  - 0 Emergency Hospital Admission (emergent condition) PROCESS - Special Needs []  - 0 Pediatric / Minor Patient Management []  - 0 Isolation Patient Management []  - 0 Hearing / Language / Visual special needs []  - 0 Assessment of Community assistance (transportation, D/C planning, etc.) []  - 0 Additional assistance / Altered mentation []  - 0 Support Surface(s) Assessment (bed, cushion, seat, etc.) INTERVENTIONS - Miscellaneous []  - 0 External ear exam []  - 0 Patient Transfer (multiple staff / Nurse, adult / Similar devices) []  - 0 Simple Staple / Suture removal (25 or less) []  - 0 Complex Staple / Suture removal (26 or more) []  - 0 Hypo/Hyperglycemic Management (do not check if billed separately) []  - 0 Ankle / Brachial Index (ABI) - do not check if billed separately Has the patient been  seen at the hospital within the last three years: Yes Total Score: 0 Level Of Care: ____ Electronic Signature(s) Signed: 09/08/2022 2:59:41  PM By: Angelina Pih Entered By: Angelina Pih on 09/08/2022 10:42:51 -------------------------------------------------------------------------------- Encounter Discharge Information Details Patient Name: Date of Service: Raymond [name], Raymond Niemann D. 09/08/2022 9:45 A M Medical Record Number: [id number] Patient Account Number: [id number] Date of Birth/Sex: Treating RN: [birth year] (57 y.o. Raymond [name] Primary Care : Martie Round Other Clinician: Referring : Treating /Extender: Gwendlyn Deutscher in Treatment: [address] ([id number]) [id number] Page 3 of 9 Encounter Discharge Information Items Post Procedure Vitals Discharge Condition: Stable Temperature (F): 98 Ambulatory Status: Ambulatory Pulse (bpm): 86 Discharge Destination: Home Respiratory Rate (breaths/min): 20 Transportation: Private Auto Blood Pressure (mmHg): 166/84 Accompanied By: self Schedule Follow-up Appointment: Yes Clinical Summary of Care: Electronic Signature(s) Signed: 09/08/2022 2:59:41 PM By: Angelina Pih Entered By: Angelina Pih on 09/08/2022 10:44:10 -------------------------------------------------------------------------------- Lower Extremity Assessment Details Patient Name: Date of Service: Raymond Blood D. 09/08/2022 9:45 A M Medical Record Number: [id number] Patient Account Number: [id number] Date of Birth/Sex: Treating RN: 06/07/1965 (57 y.o. Raymond [name] Primary Care : Martie Round Other Clinician: Referring : Treating /Extender: Raymond [name] Weeks in Treatment: 8 Edema Assessment Assessed: [Left: No] [Right: No] Edema: [Left: Ye] [Right: s] Vascular Assessment Pulses: Dorsalis Pedis Palpable:  [Right:Yes] Posterior Tibial Palpable: [Right:Yes] Extremity colors, hair growth, and conditions: Extremity Color: [Right:Normal] Hair Growth on Extremity: [Right:No] Temperature of Extremity: [Right:Warm < 3 seconds] Toe Nail Assessment Left: Right: Thick: Yes Discolored: Yes Deformed: Yes Improper Length and Hygiene: No Electronic Signature(s) Signed: 09/08/2022 2:59:41 PM By: Angelina Pih Entered By: Angelina Pih on 09/08/2022 09:57:58 Raymond [name] ([id number]) [id number] Page 4 of 9 -------------------------------------------------------------------------------- Multi Wound Chart Details Patient Name: Date of Service: Raymond [name] 09/08/2022 9:45 A M Medical Record Number: [id number] Patient Account Number: [id number] Date of Birth/Sex: Treating RN: 04/26/1965 (57 y.o. Raymond [name] Primary Care : Martie Round Other Clinician: Referring : Treating /Extender: Raymond [name] Weeks in Treatment: 8 Vital Signs Height(in): 74 Pulse(bpm): 86 Weight(lbs): 398 Blood Pressure(mmHg): 166/84 Body Mass Index(BMI): 51.1 Temperature(F): 98 Respiratory Rate(breaths/min): 20 [4:Photos:] [N/A:N/A] Right Amputation Site - T oe N/A N/A Wound Location: Surgical Injury N/A N/A Wounding Event: Open Surgical Wound N/A N/A Primary Etiology: Congestive Heart Failure, N/A N/A Comorbid History: Hypertension, Type II Diabetes, Neuropathy 06/09/2022 N/A N/A Date Acquired: 8 N/A N/A Weeks of Treatment: Open N/A N/A Wound Status: No N/A N/A Wound Recurrence: 0.7x0.3x0.3 N/A N/A Measurements L x W x D (cm) 0.165 N/A N/A A (cm) : rea 0.049 N/A N/A Volume (cm) : 90.00% N/A N/A % Reduction in Area: 98.90% N/A N/A % Reduction in Volume: Full Thickness Without Exposed N/A N/A Classification: Support Structures Medium N/A N/A Exudate Amount: Serosanguineous N/A N/A Exudate Type: red,  brown N/A N/A Exudate Color: Distinct, outline attached N/A N/A Wound Margin: Medium (34-66%) N/A N/A Granulation Amount: Red N/A N/A Granulation Quality: Medium (34-66%) N/A N/A Necrotic Amount: Fat Layer (Subcutaneous Tissue): Yes N/A N/A Exposed Structures: Fascia: No Tendon: No Muscle: No Joint: No Bone: No Small (1-33%) N/A N/A Epithelialization: Treatment Notes Electronic Signature(s) Signed: 09/08/2022 2:59:41 PM By: Angelina Pih Entered By: Angelina Pih on 09/08/2022 10:41:29 Raymond [name] ([id number]) [id number] Page 5 of 9 -------------------------------------------------------------------------------- Multi-Disciplinary Care Plan Details Patient Name: Date of Service: Raymond [name] 09/08/2022 9:45 A M Medical Record Number: [id number] Patient Account Number: [id number] Date of Birth/Sex: Treating RN: 16-Jun-1965 (57 y.o. Raymond [name] Primary Care : Martie Round Other Clinician: Referring :  Treating /Extender: Raymond [name] Weeks in Treatment: 8 Active Inactive Wound/Skin Impairment Nursing Diagnoses: Impaired tissue integrity Knowledge deficit related to ulceration/compromised skin integrity Goals: Patient/caregiver will verbalize understanding of skin care regimen Date Initiated: 07/08/2022 Date Inactivated: 08/01/2022 Target Resolution Date: 08/06/2022 Goal Status: Met Ulcer/skin breakdown will have a volume reduction of 30% by week 4 Date Initiated: 07/08/2022 Date Inactivated: 08/01/2022 Target Resolution Date: 08/06/2022 Goal Status: Met Ulcer/skin breakdown will have a volume reduction of 50% by week 8 Date Initiated: 07/08/2022 Date Inactivated: 08/08/2022 Target Resolution Date: 09/07/2022 Goal Status: Met Ulcer/skin breakdown will have a volume reduction of 80% by week 12 Date Initiated: 07/08/2022 Target Resolution Date: 10/08/2022 Goal Status: Active Ulcer/skin  breakdown will heal within 14 weeks Date Initiated: 07/08/2022 Target Resolution Date: 10/22/2022 Goal Status: Active Interventions: Assess patient/caregiver ability to obtain necessary supplies Assess patient/caregiver ability to perform ulcer/skin care regimen upon admission and as needed Assess ulceration(s) every visit Provide education on ulcer and skin care Screen for HBO Treatment Activities: Referred to DME  for dressing supplies : 07/08/2022 Skin care regimen initiated : 07/08/2022 Topical wound management initiated : 07/08/2022 Notes: Electronic Signature(s) Signed: 09/08/2022 2:59:41 PM By: Angelina Pih Entered By: Angelina Pih on 09/08/2022 10:43:07 Raymond [name] ([id number]) [id number] Page 6 of 9 -------------------------------------------------------------------------------- Pain Assessment Details Patient Name: Date of Service: Raymond [name] 09/08/2022 9:45 A M Medical Record Number: [id number] Patient Account Number: [id number] Date of Birth/Sex: Treating RN: [birth year] (57 y.o. Raymond [name] Primary Care : Martie Round Other Clinician: Referring : Treating /Extender: Raymond [name] Weeks in Treatment: 8 Active Problems Location of Pain Severity and Description of Pain Patient Has Paino Yes Site Locations Rate the pain. Current Pain Level: 8 Pain Management and Medication Current Pain Management: Notes pt states pain back and left shoulder Electronic Signature(s) Signed: 09/08/2022 2:59:41 PM By: Angelina Pih Entered By: Angelina Pih on 09/08/2022 09:54:45 -------------------------------------------------------------------------------- Patient/Caregiver Education Details Patient Name: Date of Service: Raymond [name]. 8/8/2024andnbsp9:45 A M Medical Record Number: [id number] Patient Account Number: [id number] Date of Birth/Gender: Treating  RN: [birth year] (57 y.o. Raymond [name] Primary Care Physician: Martie Round Other Clinician: Lucia [name] ([id number]) [id number] Page 7 of 9 Referring Physician: Treating Physician/Extender: Gwendlyn Deutscher in Treatment: 8 Education Assessment Education Provided To: Patient Education Topics Provided Wound Debridement: Handouts: Wound Debridement Methods: Explain/Verbal Responses: State content correctly Wound/Skin Impairment: Handouts: Caring for Your Ulcer Methods: Explain/Verbal Responses: State content correctly Electronic Signature(s) Signed: 09/08/2022 2:59:41 PM By: Angelina Pih Entered By: Angelina Pih on 09/08/2022 10:43:24 -------------------------------------------------------------------------------- Wound Assessment Details Patient Name: Date of Service: Raymond Blood D. 09/08/2022 9:45 A M Medical Record Number: [id number] Patient Account Number: [id number] Date of Birth/Sex: Treating RN: 04/20/1965 (57 y.o. 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 Wound Status: Open Wounding Event: Surgical Injury Comorbid Congestive Heart Failure, Hypertension, Type II Diabetes, Date Acquired: 06/09/2022 History: Neuropathy Weeks Of Treatment: 8 Clustered Wound: No Photos Wound Measurements Length: (cm) 0.7 Width: (cm) 0.3 Raymond [name], Raymond [name] ([id number]) Depth: (cm) 0.3 Area: (cm) 0.165 Volume: (cm) 0.049 % Reduction in Area: 90% % Reduction in Volume: 98.9% [id number] Page 8 of 9 Epithelialization: Small (1-33%) Tunneling: No Undermining: No Wound Description Classification: Full Thickness Without Exposed Support  Structures  Wound Margin: Distinct, outline attached Exudate Amount: Medium Exudate Type: Serosanguineous Exudate Color: red, brown Foul Odor After Cleansing: No Slough/Fibrino Yes Wound Bed Granulation Amount: Medium (34-66%) Exposed Structure Granulation Quality: Red Fascia Exposed: No Necrotic Amount: Medium (34-66%) Fat Layer (Subcutaneous Tissue) Exposed: Yes Necrotic Quality: Adherent Slough Tendon Exposed: No Muscle Exposed: No Joint Exposed: No Bone Exposed: No Treatment Notes Wound #4 (Amputation Site - Toe) Wound Laterality: Right Cleanser Soap and Water Discharge Instruction: Gently cleanse wound with antibacterial soap, rinse and pat dry prior to dressing wounds Peri-Wound Care Topical Primary Dressing Promogran Matrix 4.34 (in) Discharge Instruction: Moisten with KY jelly Secondary Dressing Gauze Discharge Instruction: As directed: dry, moistened with saline or moistened with Dakins Solution Secured With Medipore T - 50M Medipore H Soft Cloth Surgical T ape ape, 2x2 (in/yd) Kerlix Roll Sterile or Non-Sterile 6-ply 4.5x4 (yd/yd) Discharge Instruction: Apply Kerlix as directed Compression Wrap Compression Stockings Add-Ons Electronic Signature(s) Signed: 09/08/2022 2:59:41 PM By: Angelina Pih Entered By: Angelina Pih on 09/08/2022 10:01:56 -------------------------------------------------------------------------------- Vitals Details Patient Name: Date of Service: Raymond [name], Raymond Niemann D. 09/08/2022 9:45 A M Medical Record Number: [id number] Patient Account Number: [id number] Date of Birth/Sex: Treating RN: 06-22-1965 (57 y.o. Raymond [name] Primary Care : Martie Round Other Clinician: Referring : Treating /Extender: Raymond [name] Raymond [name] ([id number]) [id number] Page 9 of 9 Weeks in Treatment: 8 Vital Signs Time Taken: 09:52 Temperature (F): 98 Height (in):  74 Pulse (bpm): 86 Weight (lbs): 398 Respiratory Rate (breaths/min): 20 Body Mass Index (BMI): 51.1 Blood Pressure (mmHg): 166/84 Reference Range: 80 - 120 mg / dl Electronic Signature(s) Signed: 09/08/2022 2:59:41 PM By: Angelina Pih Entered By: Angelina Pih on 09/08/2022 09:54:15

## 2022-09-09 ENCOUNTER — Inpatient Hospital Stay
Admission: EM | Admit: 2022-09-09 | Discharge: 2022-09-12 | DRG: 291 | Disposition: A | Discharge status: Home / Self Care | Payer: Medicare HMO | Attending: Internal Medicine | Admitting: Internal Medicine

## 2022-09-09 ENCOUNTER — Emergency Department: Payer: Medicare HMO

## 2022-09-09 ENCOUNTER — Other Ambulatory Visit: Payer: Self-pay

## 2022-09-09 DIAGNOSIS — Z8249 Family history of ischemic heart disease and other diseases of the circulatory system: Secondary | ICD-10-CM

## 2022-09-09 DIAGNOSIS — Z833 Family history of diabetes mellitus: Secondary | ICD-10-CM | POA: Diagnosis not present

## 2022-09-09 DIAGNOSIS — R34 Anuria and oliguria: Secondary | ICD-10-CM | POA: Diagnosis present

## 2022-09-09 DIAGNOSIS — J9601 Acute respiratory failure with hypoxia: Secondary | ICD-10-CM | POA: Diagnosis not present

## 2022-09-09 DIAGNOSIS — E119 Type 2 diabetes mellitus without complications: Secondary | ICD-10-CM

## 2022-09-09 DIAGNOSIS — Z89421 Acquired absence of other right toe(s): Secondary | ICD-10-CM

## 2022-09-09 DIAGNOSIS — K219 Gastro-esophageal reflux disease without esophagitis: Secondary | ICD-10-CM | POA: Insufficient documentation

## 2022-09-09 DIAGNOSIS — I110 Hypertensive heart disease with heart failure: Principal | ICD-10-CM | POA: Diagnosis present

## 2022-09-09 DIAGNOSIS — E1140 Type 2 diabetes mellitus with diabetic neuropathy, unspecified: Secondary | ICD-10-CM | POA: Diagnosis present

## 2022-09-09 DIAGNOSIS — J9602 Acute respiratory failure with hypercapnia: Secondary | ICD-10-CM

## 2022-09-09 DIAGNOSIS — Z7984 Long term (current) use of oral hypoglycemic drugs: Secondary | ICD-10-CM | POA: Diagnosis not present

## 2022-09-09 DIAGNOSIS — E1151 Type 2 diabetes mellitus with diabetic peripheral angiopathy without gangrene: Secondary | ICD-10-CM | POA: Diagnosis present

## 2022-09-09 DIAGNOSIS — Z7982 Long term (current) use of aspirin: Secondary | ICD-10-CM | POA: Diagnosis not present

## 2022-09-09 DIAGNOSIS — I509 Heart failure, unspecified: Secondary | ICD-10-CM

## 2022-09-09 DIAGNOSIS — E785 Hyperlipidemia, unspecified: Secondary | ICD-10-CM | POA: Diagnosis present

## 2022-09-09 DIAGNOSIS — Z6841 Body Mass Index (BMI) 40.0 and over, adult: Secondary | ICD-10-CM | POA: Diagnosis not present

## 2022-09-09 DIAGNOSIS — T502X5A Adverse effect of carbonic-anhydrase inhibitors, benzothiadiazides and other diuretics, initial encounter: Secondary | ICD-10-CM | POA: Diagnosis present

## 2022-09-09 DIAGNOSIS — Z79899 Other long term (current) drug therapy: Secondary | ICD-10-CM | POA: Diagnosis not present

## 2022-09-09 DIAGNOSIS — I5033 Acute on chronic diastolic (congestive) heart failure: Secondary | ICD-10-CM | POA: Diagnosis present

## 2022-09-09 DIAGNOSIS — K760 Fatty (change of) liver, not elsewhere classified: Secondary | ICD-10-CM | POA: Diagnosis present

## 2022-09-09 DIAGNOSIS — E876 Hypokalemia: Secondary | ICD-10-CM | POA: Diagnosis present

## 2022-09-09 DIAGNOSIS — G4733 Obstructive sleep apnea (adult) (pediatric): Secondary | ICD-10-CM | POA: Diagnosis present

## 2022-09-09 DIAGNOSIS — Z794 Long term (current) use of insulin: Secondary | ICD-10-CM | POA: Diagnosis not present

## 2022-09-09 DIAGNOSIS — I10 Essential (primary) hypertension: Secondary | ICD-10-CM | POA: Insufficient documentation

## 2022-09-09 DIAGNOSIS — Z823 Family history of stroke: Secondary | ICD-10-CM

## 2022-09-09 LAB — D-DIMER, QUANTITATIVE: D-Dimer, Quant: 0.5 ug/mL-FEU (ref 0.00–0.50)

## 2022-09-09 LAB — CBC
HCT: 38.6 % — ABNORMAL LOW (ref 39.0–52.0)
Hemoglobin: 11.2 g/dL — ABNORMAL LOW (ref 13.0–17.0)
MCH: 24.1 pg — ABNORMAL LOW (ref 26.0–34.0)
MCHC: 29 g/dL — ABNORMAL LOW (ref 30.0–36.0)
MCV: 83.2 fL (ref 80.0–100.0)
Platelets: 312 10*3/uL (ref 150–400)
RBC: 4.64 MIL/uL (ref 4.22–5.81)
RDW: 16.6 % — ABNORMAL HIGH (ref 11.5–15.5)
WBC: 9 10*3/uL (ref 4.0–10.5)
nRBC: 0 % (ref 0.0–0.2)

## 2022-09-09 LAB — TROPONIN I (HIGH SENSITIVITY)
Troponin I (High Sensitivity): 9 ng/L (ref <18)
Troponin I (High Sensitivity): 11 ng/L (ref <18)

## 2022-09-09 LAB — BASIC METABOLIC PANEL
Anion gap: 11 (ref 5–15)
BUN: 16 mg/dL (ref 6–20)
CO2: 30 mmol/L (ref 22–32)
Calcium: 8.5 mg/dL — ABNORMAL LOW (ref 8.9–10.3)
Chloride: 106 mmol/L (ref 98–111)
Creatinine, Ser: 1.03 mg/dL (ref 0.61–1.24)
GFR, Estimated: >60 mL/min (ref >60)
Glucose, Bld: 73 mg/dL (ref 70–99)
Potassium: 3.5 mmol/L (ref 3.5–5.1)
Sodium: 147 mmol/L — ABNORMAL HIGH (ref 135–145)

## 2022-09-09 LAB — CBG MONITORING, ED: Glucose-Capillary: 71 mg/dL (ref 70–99)

## 2022-09-09 LAB — BRAIN NATRIURETIC PEPTIDE: B Natriuretic Peptide: 33.4 pg/mL (ref 0.0–100.0)

## 2022-09-09 MED ORDER — ATORVASTATIN CALCIUM 20 MG PO TABS
40.0000 mg | ORAL_TABLET | Freq: Every day | ORAL | Status: DC
  Administered 2022-09-10 – 2022-09-12 (×3): 40 mg via ORAL
  Filled 2022-09-09 (×3): qty 2

## 2022-09-09 MED ORDER — CARVEDILOL 6.25 MG PO TABS
6.2500 mg | ORAL_TABLET | Freq: Two times a day (BID) | ORAL | Status: DC
  Administered 2022-09-10 – 2022-09-12 (×5): 6.25 mg via ORAL
  Filled 2022-09-09 (×5): qty 1

## 2022-09-09 MED ORDER — TRAZODONE HCL 50 MG PO TABS
25.0000 mg | ORAL_TABLET | Freq: Every evening | ORAL | Status: DC | PRN
  Administered 2022-09-11: 25 mg via ORAL
  Filled 2022-09-09: qty 1

## 2022-09-09 MED ORDER — ACETAMINOPHEN 325 MG PO TABS
650.0000 mg | ORAL_TABLET | Freq: Four times a day (QID) | ORAL | Status: DC | PRN
  Administered 2022-09-10: 650 mg via ORAL
  Filled 2022-09-09: qty 2

## 2022-09-09 MED ORDER — ADULT MULTIVITAMIN W/MINERALS CH
1.0000 | ORAL_TABLET | Freq: Every day | ORAL | Status: DC
  Administered 2022-09-10 – 2022-09-12 (×3): 1 via ORAL
  Filled 2022-09-09 (×3): qty 1

## 2022-09-09 MED ORDER — GABAPENTIN 300 MG PO CAPS
300.0000 mg | ORAL_CAPSULE | Freq: Three times a day (TID) | ORAL | Status: DC
  Administered 2022-09-09 – 2022-09-12 (×8): 300 mg via ORAL
  Filled 2022-09-09 (×8): qty 1

## 2022-09-09 MED ORDER — IPRATROPIUM-ALBUTEROL 0.5-2.5 (3) MG/3ML IN SOLN
3.0000 mL | Freq: Once | RESPIRATORY_TRACT | Status: AC
  Administered 2022-09-09: 3 mL via RESPIRATORY_TRACT
  Filled 2022-09-09: qty 3

## 2022-09-09 MED ORDER — OMEGA-3-ACID ETHYL ESTERS 1 G PO CAPS
1.0000 g | ORAL_CAPSULE | Freq: Every day | ORAL | Status: DC
  Administered 2022-09-10 – 2022-09-12 (×3): 1 g via ORAL
  Filled 2022-09-09 (×3): qty 1

## 2022-09-09 MED ORDER — PANTOPRAZOLE SODIUM 40 MG PO TBEC
40.0000 mg | DELAYED_RELEASE_TABLET | Freq: Every day | ORAL | Status: DC
  Administered 2022-09-09 – 2022-09-12 (×4): 40 mg via ORAL
  Filled 2022-09-09 (×4): qty 1

## 2022-09-09 MED ORDER — ENOXAPARIN SODIUM 100 MG/ML IJ SOSY
0.5000 mg/kg | PREFILLED_SYRINGE | INTRAMUSCULAR | Status: DC
  Administered 2022-09-09 – 2022-09-11 (×3): 90 mg via SUBCUTANEOUS
  Filled 2022-09-09 (×3): qty 1

## 2022-09-09 MED ORDER — ASPIRIN 81 MG PO TBEC
81.0000 mg | DELAYED_RELEASE_TABLET | Freq: Every day | ORAL | Status: DC
  Administered 2022-09-10 – 2022-09-12 (×3): 81 mg via ORAL
  Filled 2022-09-09 (×3): qty 1

## 2022-09-09 MED ORDER — FUROSEMIDE 10 MG/ML IJ SOLN
40.0000 mg | Freq: Two times a day (BID) | INTRAMUSCULAR | Status: DC
  Administered 2022-09-09 – 2022-09-12 (×6): 40 mg via INTRAVENOUS
  Filled 2022-09-09 (×6): qty 4

## 2022-09-09 MED ORDER — FUROSEMIDE 10 MG/ML IJ SOLN
60.0000 mg | Freq: Once | INTRAMUSCULAR | Status: AC
  Administered 2022-09-09: 60 mg via INTRAVENOUS
  Filled 2022-09-09: qty 8

## 2022-09-09 MED ORDER — MAGNESIUM HYDROXIDE 400 MG/5ML PO SUSP: 30.0000 mL | Freq: Every day | ORAL | Status: DC | PRN

## 2022-09-09 MED ORDER — ONDANSETRON HCL 4 MG PO TABS: 4.0000 mg | ORAL_TABLET | Freq: Four times a day (QID) | ORAL | Status: DC | PRN

## 2022-09-09 MED ORDER — ACETAMINOPHEN 650 MG RE SUPP: 650.0000 mg | Freq: Four times a day (QID) | RECTAL | Status: DC | PRN

## 2022-09-09 MED ORDER — ONDANSETRON HCL 4 MG/2ML IJ SOLN: 4.0000 mg | Freq: Four times a day (QID) | INTRAMUSCULAR | Status: DC | PRN

## 2022-09-09 MED ORDER — LOSARTAN POTASSIUM 50 MG PO TABS
50.0000 mg | ORAL_TABLET | Freq: Every day | ORAL | Status: DC
  Administered 2022-09-09 – 2022-09-11 (×3): 50 mg via ORAL
  Filled 2022-09-09 (×3): qty 1

## 2022-09-09 MED ORDER — EMPAGLIFLOZIN 25 MG PO TABS
25.0000 mg | ORAL_TABLET | Freq: Every day | ORAL | Status: DC
  Administered 2022-09-10 – 2022-09-12 (×3): 25 mg via ORAL
  Filled 2022-09-09 (×3): qty 1

## 2022-09-09 MED ORDER — INSULIN GLARGINE-YFGN 100 UNIT/ML ~~LOC~~ SOLN
90.0000 [IU] | Freq: Two times a day (BID) | SUBCUTANEOUS | Status: DC
  Administered 2022-09-10 – 2022-09-12 (×5): 90 [IU] via SUBCUTANEOUS
  Filled 2022-09-09 (×6): qty 0.9

## 2022-09-09 MED ORDER — LIRAGLUTIDE 18 MG/3ML ~~LOC~~ SOPN: 1.2000 mg | PEN_INJECTOR | Freq: Two times a day (BID) | SUBCUTANEOUS | Status: DC

## 2022-09-09 NOTE — H&P (Signed)
[place]   PATIENT NAME: Raymond [name]    MR#:  [id number]  DATE OF BIRTH:  15--1967  DATE OF ADMISSION:  09/09/2022  PRIMARY CARE PHYSICIAN: Martie Round, NP   Patient is coming from: Home  REQUESTING/REFERRING PHYSICIAN: Willy Eddy, MD  CHIEF COMPLAINT:   Chief Complaint  Patient presents with   Chest Pain   Shortness of Breath    HISTORY OF PRESENT ILLNESS:  Raymond [name] is a 57 y.o. African-American male with medical history significant for CHF, chronic diastolic CHF, type 2 diabetes mellitus, GERD, OSA on CPAP, hypertension, peripheral neuropathy and peripheral vascular disease, who presented to the emergency room with acute onset of worsening dyspnea for the last few days with associated increasing orthopnea and exertional dyspnea as well as mild chest discomfort felt as chest tightness.  He was noted to be hypoxic to 90% with tachypnea.  Pulse oximetry has actually dropped as low as 85% on room air.  He admitted to worsening lower extremity edema as well as orthopnea and paroxysmal nocturnal dyspnea.  He admits to dyspnea on exertion.  He has occasional cough and wheezing in the morning.  He uses his CPAP regularly at night.  He has been having oliguria without dysuria or hematuria or flank pain.  No fever or chills.  No nausea or vomiting or abdominal pain or diarrhea.  No bleeding diathesis.  ED Course: When the patient came to the ER, respiratory to 24 approximately 90% on 2 L of O2 per nasal cannula with a BP of 141/63.  Later on pulse clinically was 94% on 3 L O2 via nasal cannula.  Labs revealed a sodium of 147 with a potassium of 3.5 and calcium of 8.5.  BNP was 33.4.  High-sensitivity troponin I was 9 and later on 11.  CBC showed hemoglobin of 11.2 and hematocrit 38.6.  D-dimer was 0.5. EKG as reviewed by me : EKG showed.  Normal sinus rhythm with rate of 75 with incomplete right bundle branch block. Imaging: 2 view chest x-ray  showed cardiomegaly and central vascular congestion with bilateral basilar linear that this possibly atelectasis.  The patient was given 60 mg of IV Lasix and DuoNeb.  He will be admitted to a cardiac telemetry bed for further evaluation and management. PAST MEDICAL HISTORY:   Past Medical History:  Diagnosis Date   Arthritis    hands   Cellulitis    CHF (congestive heart failure) (HCC)    Chronic diastolic heart failure (HCC)    Chronic respiratory failure with hypoxia (HCC)    2 L  continuously   Diabetes mellitus type 2, insulin dependent (HCC)    Edema    FEET/LEGS   GERD (gastroesophageal reflux disease)    Hepatic steatosis    HOH (hard of hearing)    Hypertension    Morbid obesity (HCC)    Morbid obesity with BMI of 45.0-49.9, adult (HCC)    Neuropathy    Orthopnea    OSA on CPAP    TRILOGY VENTILATOR   Oxygen deficiency    2L  HS   PVD (peripheral vascular disease) (HCC)    Shortness of breath dyspnea    Systolic heart failure (HCC)    Preserved EF 50-55%    PAST SURGICAL HISTORY:   Past Surgical History:  Procedure Laterality Date   AMPUTATION Right 10/02/2014   Procedure: AMPUTATION RAY;  Surgeon: Recardo Evangelist, DPM;  Location: [facility] ORS;  Service: Podiatry;  Laterality:  Right;   AMPUTATION TOE Right 06/09/2022   Procedure: AMPUTATION 2ND TOE RIGHT FOOT;  Surgeon: Candelaria Stagers, DPM;  Location: [facility];  Service: Podiatry;  Laterality: Right;   CATARACT EXTRACTION W/PHACO Right 09/10/2015   Procedure: CATARACT EXTRACTION PHACO AND INTRAOCULAR LENS PLACEMENT (IOC);  Surgeon: Galen Manila, MD;  Location: [facility] ORS;  Service: Ophthalmology;  Laterality: Right;  [place] 01:34 AP% 19.3 CDE 18.36 Fluid pack lot # [id number]   CATARACT EXTRACTION W/PHACO Left 10/06/2015   Procedure: CATARACT EXTRACTION PHACO AND INTRAOCULAR LENS PLACEMENT (IOC);  Surgeon: Galen Manila, MD;  Location: [facility];  Service: Ophthalmology;  Laterality: Left;  [place] 00:56 AP% 19.5 CDE  11.02 Fluid Pack # [id number]   CHOLECYSTECTOMY     COLONOSCOPY WITH PROPOFOL N/A 05/17/2016   Procedure: COLONOSCOPY WITH PROPOFOL;  Surgeon: Christena Deem, MD;  Location: [facility] ENDOSCOPY;  Service: Endoscopy;  Laterality: N/A;   COLONOSCOPY WITH PROPOFOL N/A 12/27/2021   Procedure: COLONOSCOPY WITH PROPOFOL;  Surgeon: Wyline Mood, MD;  Location: [facility] ENDOSCOPY;  Service: Gastroenterology;  Laterality: N/A;   EYE SURGERY Left    bilat laser   HIP SURGERY Right    8 th grade pins   LOWER EXTREMITY ANGIOGRAPHY Right 06/08/2022   Procedure: Lower Extremity Angiography;  Surgeon: Renford Dills, MD;  Location: [facility] INVASIVE CV LAB;  Service: Cardiovascular;  Laterality: Right;   NOSE SURGERY     PERIPHERAL VASCULAR CATHETERIZATION Right 10/02/2014   Procedure: Lower Extremity Angiography;  Surgeon: Annice Needy, MD;  Location: [facility] INVASIVE CV LAB;  Service: Cardiovascular;  Laterality: Right;   TOE AMPUTATION Left 2010   2cd   TOE AMPUTATION Right 2009    SOCIAL HISTORY:   Social History   Tobacco Use   Smoking status: Never   Smokeless tobacco: Never  Substance Use Topics   Alcohol use: No    Alcohol/week: 0.0 standard drinks of alcohol    FAMILY HISTORY:   Family History  Problem Relation Age of Onset   Hypertension Mother    Diabetes Mother    Stroke Father    Hypertension Father    Heart attack Father     DRUG ALLERGIES:  No Known Allergies  REVIEW OF SYSTEMS:   ROS As per history of present illness. All pertinent systems were reviewed above. Constitutional, HEENT, cardiovascular, respiratory, GI, GU, musculoskeletal, neuro, psychiatric, endocrine, integumentary and hematologic systems were reviewed and are otherwise negative/unremarkable except for positive findings mentioned above in the HPI.   MEDICATIONS AT HOME:   Prior to Admission medications   Medication Sig Start Date End Date Taking? Authorizing Provider  ACCU-CHEK SMARTVIEW test strip  02/06/18    [provider]  aspirin EC 81 MG tablet Take 81 mg by mouth daily.    [provider]  atorvastatin (LIPITOR) 40 MG tablet atorvastatin 40 mg tablet    [provider]  carvedilol (COREG) 6.25 MG tablet Take 1 tablet (6.25 mg total) by mouth 2 (two) times daily. 04/30/17 06/07/22  Rai, Ripudeep Kirtland Bouchard, MD  empagliflozin (JARDIANCE) 25 MG TABS tablet Take by mouth daily. Once daily in the am    [provider]  gabapentin (NEURONTIN) 300 MG capsule Take 300 mg by mouth 3 (three) times daily.    [provider]  insulin lispro (HUMALOG) 100 UNIT/ML cartridge Inject 60 Units into the skin 3 (three) times daily with meals.    [provider]  LANTUS 100 UNIT/ML injection Inject 90 Units into the skin  2 (two) times daily. 04/28/22   [provider]  liraglutide (VICTOZA) 18 MG/3ML SOPN Inject 1.2 mg into the skin in the morning and at bedtime.    [provider]  losartan (COZAAR) 50 MG tablet Take 50 mg by mouth daily.    [provider]  metFORMIN (GLUCOPHAGE) 1000 MG tablet Take 1,000 mg by mouth 2 (two) times daily with a meal.    [provider]  Multiple Vitamins-Minerals (CENTRUM SILVER 50+MEN) TABS Take 1 tablet by mouth daily.    [provider]  omega-3 acid ethyl esters (LOVAZA) 1 g capsule Take 1 g by mouth daily.    [provider]  OXYGEN Inhale 2 L into the lungs daily.    [provider]  pantoprazole (PROTONIX) 40 MG tablet Take 40 mg by mouth daily.    [provider]  torsemide (DEMADEX) 20 MG tablet Take 2 tablets (40 mg total) by mouth daily. 05/01/17   Rai, Delene Ruffini, MD  TRUEPLUS INSULIN SYRINGE 29G X 1/2" 1 ML MISC  02/16/18   [provider]  TRUEPLUS PEN NEEDLES 31G X 8 MM MISC  02/06/18   [provider]      VITAL SIGNS:  Blood pressure (!) 141/63, pulse 73, temperature 97.9 F (36.6 C), temperature source Oral, resp. rate 17, height 6\' 2"   (1.88 m), weight (!) 179.6 kg, SpO2 94%.  PHYSICAL EXAMINATION:  Physical Exam  GENERAL:  57 y.o.-year-old African-American male patient lying in the bed with mild respiratory distress with conversational dyspnea. EYES: Pupils equal, round, reactive to light and accommodation. No scleral icterus. Extraocular muscles intact.  HEENT: Head atraumatic, normocephalic. Oropharynx and nasopharynx clear.  NECK:  Supple, no jugular venous distention. No thyroid enlargement, no tenderness.  LUNGS: Diminished bibasilar breath sounds with bibasilar rales. No use of accessory muscles of respiration.  CARDIOVASCULAR: Regular rate and rhythm, S1, S2 normal. No murmurs, rubs, or gallops.  ABDOMEN: Soft, nondistended, nontender. Bowel sounds present. No organomegaly or mass.  EXTREMITIES: No pedal edema, cyanosis, or clubbing.  NEUROLOGIC: Cranial nerves II through XII are intact. Muscle strength 5/5 in all extremities. Sensation intact. Gait not checked.  PSYCHIATRIC: The patient is alert and oriented x 3.  Normal affect and good eye contact. SKIN: No obvious rash, lesion, or ulcer.   LABORATORY PANEL:   CBC Recent Labs  Lab 09/09/22 1516  WBC 9.0  HGB 11.2*  HCT 38.6*  PLT 312   ------------------------------------------------------------------------------------------------------------------  Chemistries  Recent Labs  Lab 09/09/22 1516  NA 147*  K 3.5  CL 106  CO2 30  GLUCOSE 73  BUN 16  CREATININE 1.03  CALCIUM 8.5*   ------------------------------------------------------------------------------------------------------------------  Cardiac Enzymes No results for input(s): "TROPONINI" in the last 168 hours. ------------------------------------------------------------------------------------------------------------------  RADIOLOGY:  DG Chest 2 View  Result Date: 09/09/2022 CLINICAL DATA:  Chest pain EXAM: CHEST - 2 VIEW COMPARISON:  X-ray 02/18/2022 FINDINGS: Under penetrated  radiographs. Underinflation basilar atelectasis or scarring bilaterally. Enlarged cardiopericardial silhouette with slight prominence of the central vasculature. No pneumothorax, edema or consolidation. Overlapping cardiac leads. Motion on lateral view. IMPRESSION: Limited x-rays. Enlarged heart with some central vascular congestion. Basilar bilateral linear areas of density, possibly atelectasis. Electronically Signed   By: Karen Kays M.D.   On: 09/09/2022 16:13      IMPRESSION AND PLAN:  Assessment and Plan: * Acute on chronic diastolic (congestive) heart failure (HCC)  -The patient will be admitted to a cardiac telemetry bed. - The patient  has subsequent acute respiratory failure with hypoxia. - We will continue diuresis with IV Lasix. - We Will follow serial troponins. - We will follow I's and O's and daily weights. - Will continue Jardiance and beta-blocker therapy with Coreg. - 2D echo and cardiology consult be obtained.  Last 2D echo on 04/28/2017 revealed an EF of 60 to 65% and on 06/30/2020 EF was 55% with moderate to severe LVH and mild valvular regurgitation.. - I notified Dr. Juliann Pares about the patient.   Dyslipidemia - Will continue statin therapy.  Type 2 diabetes mellitus without complications (HCC) - The will be placed on supplemental coverage with NovoLog and will continue her basal coverage. - Will hold off metformin. - Will continue Jardiance.  GERD without esophagitis - Will continue PPI I therapy.  Essential hypertension - Will continue her antihypertensive therapy.   DVT prophylaxis: Lovenox..  Advanced Care Planning:  Code Status: full code.  Family Communication:  The plan of care was discussed in details with the patient (and family). I answered all questions. The patient agreed to proceed with the above mentioned plan. Further management will depend upon hospital course. Disposition Plan: Back to previous home environment Consults called: Cardiology All  the records are reviewed and case discussed with ED provider.  Status is: Inpatient  At the time of the admission, it appears that the appropriate admission status for this patient is inpatient.  This is judged to be reasonable and necessary in order to provide the required intensity of service to ensure the patient's safety given the presenting symptoms, physical exam findings and initial radiographic and laboratory data in the context of comorbid conditions.  The patient requires inpatient status due to high intensity of service, high risk of further deterioration and high frequency of surveillance required.  I certify that at the time of admission, it is my clinical judgment that the patient will require inpatient hospital care extending more than 2 midnights.                            Dispo: The patient is from: Home              Anticipated d/c is to: Home              Patient currently is not medically stable to d/c.              Difficult to place patient: No  Hannah Beat M.D on 09/09/2022 at 8:52 PM  [facility]   From 7 PM-7 AM, contact night-coverage [email or web address]  CC: Primary care physician; Martie Round, NP

## 2022-09-09 NOTE — ED Provider Notes (Signed)
[facility] Provider Note    Event Date/Time   First MD Initiated Contact with Patient 09/09/22 1522     (approximate)   History   Chest Pain and Shortness of Breath   HPI  Raymond [name] is a 57 y.o. male extensive history of CHF as well as sleep apnea diabetes presents to the ER for evaluation of shortness of breath.  Been on torsemide twice daily but feels like over the past few days symptoms have worsened with increasing orthopnea, exertional dyspnea as well as some chest discomfort and pain.  Feels a tightness.  Does not wear home oxygen but wears CPAP at night.  Was found to be hypoxic to 90% with tachypnea.  Feels like he is not urinating as much as he did previously.     Physical Exam   Triage Vital Signs: ED Triage Vitals  Encounter Vitals Group     BP --      Systolic BP Percentile --      Diastolic BP Percentile --      Pulse Rate 09/09/22 1517 75     Resp 09/09/22 1517 (!) 24     Temp 09/09/22 1517 98.4 F (36.9 C)     Temp Source 09/09/22 1517 Oral     SpO2 09/09/22 1514 94 %     Weight 09/09/22 1518 (!) 396 lb (179.6 kg)     Height 09/09/22 1518 6\' 2"  (1.88 m)     Head Circumference --      Peak Flow --      Pain Score --      Pain Loc --      Pain Education --      Exclude from Growth Chart --     Most recent vital signs: Vitals:   09/09/22 1930 09/09/22 1934  BP:  (!) 141/63  Pulse: 73 73  Resp: (!) 23 17  Temp:  97.9 F (36.6 C)  SpO2: 94% 94%     Constitutional: Alert  Eyes: Conjunctivae are normal.  Head: Atraumatic. Nose: No congestion/rhinnorhea. Mouth/Throat: Mucous membranes are moist.   Neck: Painless ROM.  Cardiovascular:   Good peripheral circulation. Respiratory: Normal respiratory effort.  No retractions.  Gastrointestinal: Soft and nontender.  Musculoskeletal:  no deformity Neurologic:  MAE spontaneously. No gross focal neurologic deficits are appreciated.  Skin:  Skin is warm, dry  and intact. No rash noted. Psychiatric: Mood and affect are normal. Speech and behavior are normal.    ED Results / Procedures / Treatments   Labs (all labs ordered are listed, but only abnormal results are displayed) Labs Reviewed  BASIC METABOLIC PANEL - Abnormal; Notable for the following components:      Result Value   Sodium 147 (*)    Calcium 8.5 (*)    All other components within normal limits  CBC - Abnormal; Notable for the following components:   Hemoglobin 11.2 (*)    HCT 38.6 (*)    MCH 24.1 (*)    MCHC 29.0 (*)    RDW 16.6 (*)    All other components within normal limits  BRAIN NATRIURETIC PEPTIDE  D-DIMER, QUANTITATIVE  BASIC METABOLIC PANEL  CBC  CBG MONITORING, ED  TROPONIN I (HIGH SENSITIVITY)  TROPONIN I (HIGH SENSITIVITY)     EKG  ED ECG REPORT I, Willy Eddy, the attending physician, personally viewed and interpreted this ECG.   Date: 09/09/2022  EKG Time: 15:17  Rate: 75  Rhythm: sinus  Axis:  normal  Intervals:iRBBB  ST&T Change: no stemi    RADIOLOGY Please see ED Course for my review and interpretation.  I personally reviewed all radiographic images ordered to evaluate for the above acute complaints and reviewed radiology reports and findings.  These findings were personally discussed with the patient.  Please see medical record for radiology report.    PROCEDURES:  Critical Care performed: Yes, see critical care procedure note(s)  .Critical Care  Performed by: Willy Eddy, MD Authorized by: Willy Eddy, MD   Critical care provider statement:    Critical care time (minutes):  34   Critical care was necessary to treat or prevent imminent or life-threatening deterioration of the following conditions:  Respiratory failure   Critical care was time spent personally by me on the following activities:  Ordering and performing treatments and interventions, ordering and review of laboratory studies, ordering and review of  radiographic studies, pulse oximetry, re-evaluation of patient's condition, review of old charts, obtaining history from patient or surrogate, examination of patient, evaluation of patient's response to treatment, discussions with primary provider, discussions with consultants and development of treatment plan with patient or surrogate    MEDICATIONS ORDERED IN ED: Medications  aspirin EC tablet 81 mg (has no administration in time range)  atorvastatin (LIPITOR) tablet 40 mg (has no administration in time range)  carvedilol (COREG) tablet 6.25 mg (has no administration in time range)  losartan (COZAAR) tablet 50 mg (has no administration in time range)  omega-3 acid ethyl esters (LOVAZA) capsule 1 g (has no administration in time range)  empagliflozin (JARDIANCE) tablet 25 mg (has no administration in time range)  insulin glargine-yfgn (SEMGLEE) injection 90 Units (has no administration in time range)  liraglutide (VICTOZA) SOPN 1.2 mg (has no administration in time range)  pantoprazole (PROTONIX) EC tablet 40 mg (has no administration in time range)  gabapentin (NEURONTIN) capsule 300 mg (has no administration in time range)  Centrum Silver 50+Men TABS 1 tablet (has no administration in time range)  enoxaparin (LOVENOX) injection 90 mg (90 mg Subcutaneous Given 09/09/22 2127)  furosemide (LASIX) injection 40 mg (40 mg Intravenous Given 09/09/22 2044)  acetaminophen (TYLENOL) tablet 650 mg (has no administration in time range)    Or  acetaminophen (TYLENOL) suppository 650 mg (has no administration in time range)  traZODone (DESYREL) tablet 25 mg (has no administration in time range)  magnesium hydroxide (MILK OF MAGNESIA) suspension 30 mL (has no administration in time range)  ondansetron (ZOFRAN) tablet 4 mg (has no administration in time range)    Or  ondansetron (ZOFRAN) injection 4 mg (has no administration in time range)  furosemide (LASIX) injection 60 mg (60 mg Intravenous Given 09/09/22  1741)  ipratropium-albuterol (DUONEB) 0.5-2.5 (3) MG/3ML nebulizer solution 3 mL (3 mLs Nebulization Given 09/09/22 1828)     IMPRESSION / MDM / ASSESSMENT AND PLAN / ED COURSE  I reviewed the triage vital signs and the nursing notes.                              Differential diagnosis includes, but is not limited to, Asthma, copd, CHF, pna, ptx, malignancy, Pe, anemia  Patient presenting to the ER for evaluation of symptoms as described above.  Based on symptoms, risk factors and considered above differential, this presenting complaint could reflect a potentially life-threatening illness therefore the patient will be placed on continuous pulse oximetry and telemetry for monitoring.  Laboratory evaluation will  be sent to evaluate for the above complaints.      Clinical Course as of 09/09/22 2148  [facility] Sep 09, 2022  1559 Chest x-ray on my review inter no dense of CHF pulmonary edema. [PR]  1736 Dimer is negative.  Presentation most consistent with acute on chronic CHF.  Will give IV Lasix. [PR]  1947 Patient was found to be hypoxic to 85% on room air with a good waveform.  Based on his new hypoxia I do feel he is going require hospitalization for additional diuresis. [PR]  1947 States he did not feel much difference after the nebulizer treatment.  Has started to feel some improvement after diuresis.  Will consult hospitalist for admission. [PR]    Clinical Course User Index [PR] Willy Eddy, MD     FINAL CLINICAL IMPRESSION(S) / ED DIAGNOSES   Final diagnoses:  Acute respiratory failure with hypoxia (HCC)  Acute on chronic congestive heart failure, unspecified heart failure type (HCC)     Rx / DC Orders   ED Discharge Orders     None        Note:  This document was prepared using Dragon voice recognition software and may include unintentional dictation errors.    Willy Eddy, MD 09/09/22 2148

## 2022-09-09 NOTE — Assessment & Plan Note (Signed)
-   Will continue PPI I therapy.

## 2022-09-09 NOTE — ED Notes (Signed)
Pt given a urinal to use the bathroom.

## 2022-09-09 NOTE — ED Triage Notes (Signed)
Pt comes in via [facility] from home with complaints of chest pain, and shortness of breath on exertion x2 days. According to EMS the patient complains of pain in his chest when he breaths. Pt has a history of CHF, diabetes, and sleep apnea. Pt is alert and oriented x4, with no signs of distress at this time.

## 2022-09-09 NOTE — Assessment & Plan Note (Signed)
-   Will continue her antihypertensive therapy.

## 2022-09-09 NOTE — Assessment & Plan Note (Signed)
Will continue statin therapy. ?

## 2022-09-09 NOTE — Assessment & Plan Note (Addendum)
-  The patient will be admitted to a cardiac telemetry bed. - The patient has subsequent acute respiratory failure with hypoxia. - We will continue diuresis with IV Lasix. - We Will follow serial troponins. - We will follow I's and O's and daily weights. - Will continue Jardiance and beta-blocker therapy with Coreg. - 2D echo and cardiology consult be obtained.  Last 2D echo on 04/28/2017 revealed an EF of 60 to 65% and on 06/30/2020 EF was 55% with moderate to severe LVH and mild valvular regurgitation.. - I notified Dr. Juliann Pares about the patient.

## 2022-09-09 NOTE — Assessment & Plan Note (Signed)
-   The will be placed on supplemental coverage with NovoLog and will continue her basal coverage. - Will hold off metformin. - Will continue Jardiance.

## 2022-09-10 ENCOUNTER — Inpatient Hospital Stay
Admit: 2022-09-10 | Discharge: 2022-09-10 | Disposition: A | Discharge status: Home / Self Care | Payer: Medicare HMO | Attending: Family Medicine | Admitting: Family Medicine

## 2022-09-10 LAB — GLUCOSE, CAPILLARY: Glucose-Capillary: 171 mg/dL — ABNORMAL HIGH (ref 70–99)

## 2022-09-10 MED ORDER — POTASSIUM CHLORIDE CRYS ER 20 MEQ PO TBCR
40.0000 meq | EXTENDED_RELEASE_TABLET | Freq: Once | ORAL | Status: AC
  Administered 2022-09-10: 40 meq via ORAL
  Filled 2022-09-10: qty 2

## 2022-09-10 MED ORDER — PERFLUTREN LIPID MICROSPHERE
1.0000 mL | INTRAVENOUS | Status: AC | PRN
  Administered 2022-09-10: 5 mL via INTRAVENOUS

## 2022-09-10 NOTE — ED Notes (Signed)
Patient transferred to hospital bed.  

## 2022-09-10 NOTE — Progress Notes (Addendum)
Progress Note   Patient: Raymond [name] RKY:[id number] DOB: [birth year] DOA: 09/09/2022     1 DOS: the patient was seen and examined on 09/10/2022   Brief hospital course: Pt has admitted overnight for acute respiratory failure with hypoxia secondary to acutye on chronic dCHF  Assessment and Plan: Acute on chronic diastolic (congestive) heart failure (HCC):  Echo 06/30/2020: EF was 55% with moderate to severe LVH and mild valvular regurgitation.Marland Kitchen HST trended as 9--> 11 On CPAP overnight, UOP since admission about 3.6L  Continue diuresis with IV Lasix. Monitor strict I's and O's and daily weights. Continue Jardiance and beta-blocker therapy with Coreg. Echo ordered. Follow up 2D echo and cardiology consult   Hypokalemia: Likely secondary to diuretic therapy, replace and recheck levels in am Check Mg  Dyslipidemia: continue statin therapy.  Type 2 diabetes mellitus without complications (HCC): controlled SSI prn, f/u FSBS continue Jardiance.  GERD without esophagitis: continue PPI   Essential hypertension:controlled, continue coreg, losartan and lasix at current dosing      Subjective: Reported chest pressure/tightness. Shortness still present, slightly better from yesterday Denies fever, chills, nausea, vomiting, diarrhea/constipation   Physical Exam: Vitals:   09/10/22 0538 09/10/22 0545 09/10/22 0600 09/10/22 0700  BP:   (!) 144/48 (!) 150/65  Pulse:  71 71 71  Resp:  18 19 15   Temp: 97.9 F (36.6 C)     TempSrc: Oral     SpO2:  95% 95% 95%  Weight:      Height:       Physical Exam Constitutional:      General: He is not in acute distress.    Appearance: He is ill-appearing. He is not toxic-appearing.     Comments: Morbidly obese patient  HENT:     Head: Normocephalic and atraumatic.  Eyes:     Extraocular Movements: Extraocular movements intact.     Pupils: Pupils are equal, round, and reactive to light.  Neck:     Vascular: JVD present.   Cardiovascular:     Rate and Rhythm: Normal rate and regular rhythm.     Heart sounds: Normal heart sounds.  Pulmonary:     Breath sounds: Examination of the right-middle field reveals rales. Examination of the left-middle field reveals rales. Examination of the right-lower field reveals rales. Examination of the left-lower field reveals rales. Decreased breath sounds and rales present.  Abdominal:     General: Bowel sounds are normal.     Palpations: Abdomen is soft.     Comments: Obese abdomen with edema, Has dark pigmentation with thick skin over lateral abdominal walls- which he states as chronic  Musculoskeletal:        General: Normal range of motion.     Cervical back: Normal range of motion.     Right lower leg: No tenderness. Edema present.     Left lower leg: No tenderness. Edema present.  Skin:    General: Skin is warm.     Capillary Refill: Capillary refill takes 2 to 3 seconds.     Findings: No erythema.  Neurological:     Mental Status: He is alert.       Data Reviewed:  Latest Reference Range & Units 09/10/22 05:37  Sodium 135 - 145 mmol/L 145  Potassium 3.5 - 5.1 mmol/L 3.3 (L)  Chloride 98 - 111 mmol/L 106  CO2 22 - 32 mmol/L 32  Glucose 70 - 99 mg/dL 73  BUN 6 - 20 mg/dL 14  Creatinine 3.15 - 1.76  mg/dL 4.69  Calcium 8.9 - 62.9 mg/dL 8.6 (L)  Anion gap 5 - 15  7  GFR, Estimated >60 mL/min >60  WBC 4.0 - 10.5 K/uL 9.5  RBC 4.22 - 5.81 MIL/uL 4.73  Hemoglobin 13.0 - 17.0 g/dL 52.8 (L)  HCT 41.3 - 24.4 % 38.7 (L)  MCV 80.0 - 100.0 fL 81.8  MCH 26.0 - 34.0 pg 23.9 (L)  MCHC 30.0 - 36.0 g/dL 01.0 (L)  RDW 27.2 - 53.6 % 16.6 (H)  Platelets 150 - 400 K/uL 307  nRBC 0.0 - 0.2 % 0.0  (L): Data is abnormally low (H): Data is abnormally high   Disposition: Status is: Inpatient   Planned Discharge Destination: Home    Time spent: 35 minutes  Author: Ernestene Mention, MD 09/10/2022 8:33 AM  For on call review [email or web address].

## 2022-09-10 NOTE — Group Note (Deleted)
Date:  09/10/2022 Time:  2:12 AM  Group Topic/Focus:  Wrap-Up Group:   The focus of this group is to help patients review their daily goal of treatment and discuss progress on daily workbooks.     Participation Level:  {BHH PARTICIPATION ZOXWR:60454}  Participation Quality:  {BHH PARTICIPATION QUALITY:22265}  Affect:  {BHH AFFECT:22266}  Cognitive:  {BHH COGNITIVE:22267}  Insight: {BHH Insight2:20797}  Engagement in Group:  {BHH ENGAGEMENT IN UJWJX:91478}  Modes of Intervention:  {BHH MODES OF INTERVENTION:22269}  Additional Comments:  ***  Maglione,Stan Cantave E 09/10/2022, 2:12 AM

## 2022-09-10 NOTE — Progress Notes (Signed)
  Echocardiogram 2D Echocardiogram has been performed. Definity IV ultrasound imaging agent used on this study.  Lenor Coffin 09/10/2022, 11:28 AM

## 2022-09-10 NOTE — Plan of Care (Signed)

## 2022-09-11 ENCOUNTER — Encounter: Payer: Self-pay | Admitting: Family Medicine

## 2022-09-11 DIAGNOSIS — I5033 Acute on chronic diastolic (congestive) heart failure: Secondary | ICD-10-CM | POA: Diagnosis not present

## 2022-09-11 LAB — BASIC METABOLIC PANEL WITH GFR
Anion gap: 10 (ref 5–15)
BUN: 18 mg/dL (ref 6–20)
CO2: 29 mmol/L (ref 22–32)
Calcium: 8.6 mg/dL — ABNORMAL LOW (ref 8.9–10.3)
Chloride: 103 mmol/L (ref 98–111)
Creatinine, Ser: 1.08 mg/dL (ref 0.61–1.24)
GFR, Estimated: >60 mL/min (ref >60)
Glucose, Bld: 141 mg/dL — ABNORMAL HIGH (ref 70–99)
Potassium: 3.7 mmol/L (ref 3.5–5.1)
Sodium: 142 mmol/L (ref 135–145)

## 2022-09-11 LAB — MAGNESIUM: Magnesium: 2.4 mg/dL (ref 1.7–2.4)

## 2022-09-11 NOTE — Plan of Care (Signed)

## 2022-09-11 NOTE — Progress Notes (Signed)
[facility]  - [place] [facility]   PATIENT NAME: Raymond [name]    MR#:  [id number]  DATE OF BIRTH:  10/08/1965  SUBJECTIVE:  patient sitting up in the chair. Overall breathing comfortably. Good urine output since admission. Uses CPAP at night. Currently 95% on room air.    VITALS:  Blood pressure 113/77, pulse 77, temperature 98.1 F (36.7 C), resp. rate 16, height 6\' 2"  (1.88 m), weight (!) 183.6 kg, SpO2 95%.  PHYSICAL EXAMINATION:   GENERAL:  57 y.o.-year-old patient with no acute distress. Morbid obesity LUNGS: Normal breath sounds bilaterally, no wheezing CARDIOVASCULAR: S1, S2 normal. No murmur   ABDOMEN: Soft, nontender, nondistended. Bowel sounds present.  EXTREMITIES bilateral lower extremity chronic edema improving. Bilateral toe amputation's chronic NEUROLOGIC: nonfocal  patient is alert and awake   LABORATORY PANEL:  CBC Recent Labs  Lab 09/10/22 0537  WBC 9.5  HGB 11.3*  HCT 38.7*  PLT 307    Chemistries  Recent Labs  Lab 09/11/22 0427  NA 142  K 3.7  CL 103  CO2 29  GLUCOSE 141*  BUN 18  CREATININE 1.08  CALCIUM 8.6*  MG 2.4    RADIOLOGY:  DG Chest 2 View  Result Date: 09/09/2022 CLINICAL DATA:  Chest pain EXAM: CHEST - 2 VIEW COMPARISON:  X-ray 02/18/2022 FINDINGS: Under penetrated radiographs. Underinflation basilar atelectasis or scarring bilaterally. Enlarged cardiopericardial silhouette with slight prominence of the central vasculature. No pneumothorax, edema or consolidation. Overlapping cardiac leads. Motion on lateral view. IMPRESSION: Limited x-rays. Enlarged heart with some central vascular congestion. Basilar bilateral linear areas of density, possibly atelectasis. Electronically Signed   By: Karen Kays M.D.   On: 09/09/2022 16:13    Assessment and Plan Raymond [name] is a 57 y.o. African-American male with medical history significant for CHF, chronic diastolic CHF, type 2 diabetes mellitus,  GERD, OSA on CPAP, hypertension, peripheral neuropathy and peripheral vascular disease, who presented to the emergency room with acute onset of worsening dyspnea for the last few days with associated increasing orthopnea and exertional dyspnea as well as mild chest discomfort felt as chest tightness.    chest x-ray showed cardiomegaly and central vascular congestion with bilateral basilar linear that this possibly atelectasis.   Acute on chronic diastolic (congestive) heart failure (HCC):  OSA --Echo 06/30/2020: EF was 55% with moderate to severe LVH and mild valvular regurgitation.Marland Kitchen --On CPAP overnight -- UOP since admission about -9.1L --Continue diuresis with IV Lasix. --Monitor strict I's and O's and daily weights. --Continue Jardiance and beta-blocker therapy with Coreg. --Echo ordered--results pending   Hypokalemia: Likely secondary to diuretic therapy --K 3.7   Dyslipidemia: continue statin therapy.   Type 2 diabetes mellitus without complications (HCC): -- controlled --SSI prn, f/u FSBS --continue Jardiance.   GERD without esophagitis: continue PPI    Essential hypertension:controlled -- continue coreg, losartan and lasix at current dosing  Overall improving. If remains stable discharge in 1 to 2 days  Procedures: none Family communication : none on Consults : none CODE STATUS: full DVT Prophylaxis : Lovenox Level of care: Telemetry Cardiac Status is: Inpatient Remains inpatient appropriate because: continue treatment for CHF    TOTAL TIME TAKING CARE OF THIS PATIENT: 35 minutes.  >50% time spent on counselling and coordination of care  Note: This dictation was prepared with Dragon dictation along with smaller phrase technology. Any transcriptional errors that result from this process are unintentional.  [name] M.D    [facility]  CC: Primary care physician; Martie Round, NP

## 2022-09-12 DIAGNOSIS — I5033 Acute on chronic diastolic (congestive) heart failure: Secondary | ICD-10-CM | POA: Diagnosis not present

## 2022-09-12 NOTE — Plan of Care (Signed)
  Problem: Education: Goal: Ability to demonstrate management of disease process will improve 09/12/2022 1232 by Sherre Scarlet, RN Outcome: Completed/Met 09/12/2022 0840 by Sherre Scarlet, RN Outcome: Progressing Goal: Ability to verbalize understanding of medication therapies will improve 09/12/2022 1232 by Sherre Scarlet, RN Outcome: Completed/Met 09/12/2022 0840 by Sherre Scarlet, RN Outcome: Progressing Goal: Individualized Educational Video(s) 09/12/2022 1232 by Sherre Scarlet, RN Outcome: Completed/Met 09/12/2022 0840 by Sherre Scarlet, RN Outcome: Progressing   Problem: Activity: Goal: Capacity to carry out activities will improve 09/12/2022 1232 by Sherre Scarlet, RN Outcome: Completed/Met 09/12/2022 0840 by Sherre Scarlet, RN Outcome: Progressing   Problem: Cardiac: Goal: Ability to achieve and maintain adequate cardiopulmonary perfusion will improve 09/12/2022 1232 by Sherre Scarlet, RN Outcome: Completed/Met 09/12/2022 0840 by Sherre Scarlet, RN Outcome: Progressing   Problem: Education: Goal: Knowledge of General Education information will improve Description: Including pain rating scale, medication(s)/side effects and non-pharmacologic comfort measures 09/12/2022 1232 by Sherre Scarlet, RN Outcome: Completed/Met 09/12/2022 0840 by Sherre Scarlet, RN Outcome: Progressing   Problem: Health Behavior/Discharge Planning: Goal: Ability to manage health-related needs will improve 09/12/2022 1232 by Sherre Scarlet, RN Outcome: Completed/Met 09/12/2022 0840 by Sherre Scarlet, RN Outcome: Progressing   Problem: Clinical Measurements: Goal: Ability to maintain clinical measurements within normal limits will improve 09/12/2022 1232 by Sherre Scarlet, RN Outcome: Completed/Met 09/12/2022 0840 by Sherre Scarlet, RN Outcome: Progressing Goal: Will remain free from infection 09/12/2022 1232 by Sherre Scarlet, RN Outcome: Completed/Met 09/12/2022 0840 by Sherre Scarlet, RN Outcome: Progressing Goal: Diagnostic test results will improve 09/12/2022 1232 by Sherre Scarlet, RN Outcome: Completed/Met 09/12/2022 0840 by Sherre Scarlet, RN Outcome: Progressing Goal: Respiratory complications will improve 09/12/2022 1232 by Sherre Scarlet, RN Outcome: Completed/Met 09/12/2022 0840 by Sherre Scarlet, RN Outcome: Progressing Goal: Cardiovascular complication will be avoided 09/12/2022 1232 by Sherre Scarlet, RN Outcome: Completed/Met 09/12/2022 0840 by Sherre Scarlet, RN Outcome: Progressing   Problem: Activity: Goal: Risk for activity intolerance will decrease 09/12/2022 1232 by Sherre Scarlet, RN Outcome: Completed/Met 09/12/2022 0840 by Sherre Scarlet, RN Outcome: Progressing   Problem: Nutrition: Goal: Adequate nutrition will be maintained 09/12/2022 1232 by Sherre Scarlet, RN Outcome: Completed/Met 09/12/2022 0840 by Sherre Scarlet, RN Outcome: Progressing   Problem: Coping: Goal: Level of anxiety will decrease 09/12/2022 1232 by Sherre Scarlet, RN Outcome: Completed/Met 09/12/2022 0840 by Sherre Scarlet, RN Outcome: Progressing   Problem: Elimination: Goal: Will not experience complications related to bowel motility 09/12/2022 1232 by Sherre Scarlet, RN Outcome: Completed/Met 09/12/2022 0840 by Sherre Scarlet, RN Outcome: Progressing Goal: Will not experience complications related to urinary retention 09/12/2022 1232 by Sherre Scarlet, RN Outcome: Completed/Met 09/12/2022 0840 by Sherre Scarlet, RN Outcome: Progressing   Problem: Pain Managment: Goal: General experience of comfort will improve 09/12/2022 1232 by Sherre Scarlet, RN Outcome: Completed/Met 09/12/2022 0840 by Sherre Scarlet, RN Outcome: Progressing   Problem: Safety: Goal: Ability to remain free from injury will improve 09/12/2022 1232 by Sherre Scarlet, RN Outcome: Completed/Met 09/12/2022 0840 by Sherre Scarlet, RN Outcome: Progressing   Problem:  Skin Integrity: Goal: Risk for impaired skin integrity will decrease 09/12/2022 1232 by Sherre Scarlet, RN Outcome: Completed/Met 09/12/2022 0840 by Sherre Scarlet, RN Outcome: Progressing

## 2022-09-12 NOTE — Discharge Instructions (Signed)
Use your CPAP and oxygen as before

## 2022-09-12 NOTE — Plan of Care (Signed)

## 2022-09-12 NOTE — Care Management Important Message (Signed)
Important Message  Patient Details  Name: Raymond [name] MRN: [id number] Date of Birth: 1966/01/31   Medicare Important Message Given:  Yes     [name] 09/12/2022, 11:56 AM

## 2022-09-12 NOTE — Discharge Summary (Addendum)
Physician Discharge Summary   Patient: Raymond [name]: [id number] DOB: [birth year]  Admit date:     09/09/2022  Discharge date: 09/12/22  Discharge Physician: [name]   PCP: Martie Round, NP   Recommendations at discharge:   F/u M[facility] D[facility] PA in 1-2 weeks Keep log of sugars at home and take your Diabetes meds Use your CPAP/o2 as before F/u PCP in 1-2 weeks  Discharge Diagnoses: Principal Problem:   Acute on chronic diastolic (congestive) heart failure (HCC) Active Problems:   Dyslipidemia   Type 2 diabetes mellitus without complications (HCC)   Essential hypertension   GERD without esophagitis   Acute respiratory failure with hypoxia (HCC)  Raymond [name] with medical history significant for CHF, chronic diastolic CHF, type 2 diabetes mellitus, GERD, OSA on CPAP, hypertension, peripheral neuropathy and peripheral vascular disease, who presented to the emergency room with acute onset of worsening dyspnea for the last few days with associated increasing orthopnea and exertional dyspnea as well as mild chest discomfort felt as chest tightness.     chest x-ray showed cardiomegaly and central vascular congestion with bilateral basilar linear that this possibly atelectasis.    Acute on chronic diastolic (congestive) heart failure (HCC):  OSA --Echo 06/30/2020: EF was 55% with moderate to severe LVH and mild valvular regurgitation.Marland Kitchen --On CPAP overnight -- UOP since admission about -8.51L --Continue diuresis with IV Lasix--change to po torsemide 40 mg qd --Monitor strict I's and O's and daily weights. --Continue Jardiance and beta-blocker therapy with Coreg. --Echo ordered--EF 55-60% LVH --pt to f/u [facility] cardiology Minda Ditto in [place] (used to f/u Dr Gwen Pounds)   Hypokalemia: Likely secondary to diuretic therapy --K 3.7    Dyslipidemia: continue statin therapy.   Type 2 diabetes mellitus without  complications (HCC): -- controlled --SSI prn, f/u FSBS --continue Jardiance, metformin, Glargine, victoza   GERD without esophagitis: continue PPI    Essential hypertension:controlled -- continue coreg valsartan and torsemide at current dosing   Overall improving.leg edema improved. Pt's vitals including oxygen sats stable. He feels well. Agreeable for d/c and f/u PCP cardiology as outpt   Procedures: none Family communication : none on Consults : none CODE STATUS: full DVT Prophylaxis : Lovenox Level of care: Telemetry Cardiac       DISCHARGE MEDICATION: Allergies as of 09/12/2022   No Known Allergies      Medication List     STOP taking these medications    Accu-Chek SmartView test strip Generic drug: glucose blood   ciprofloxacin 500 MG tablet Commonly known as: CIPRO   doxycycline 100 MG capsule Commonly known as: VIBRAMYCIN   losartan 50 MG tablet Commonly known as: COZAAR       TAKE these medications    aspirin EC 81 MG tablet Take 81 mg by mouth daily.   atorvastatin 40 MG tablet Commonly known as: LIPITOR atorvastatin 40 mg tablet   carvedilol 6.25 MG tablet Commonly known as: COREG Take 1 tablet (6.25 mg total) by mouth 2 (two) times daily.   Centrum Silver 50+Men Tabs Take 1 tablet by mouth daily.   empagliflozin 25 MG Tabs tablet Commonly known as: JARDIANCE Take by mouth daily. Once daily in the am   gabapentin 300 MG capsule Commonly known as: NEURONTIN Take 300 mg by mouth 3 (three) times daily.   insulin lispro 100 UNIT/ML cartridge Commonly known as: HUMALOG Inject 60 Units into the skin 3 (three) times daily with meals.  Lantus 100 UNIT/ML injection Generic drug: insulin glargine Inject 90 Units into the skin 2 (two) times daily.   liraglutide 18 MG/3ML Sopn Commonly known as: VICTOZA Inject 1.2 mg into the skin in the morning and at bedtime.   metFORMIN 1000 MG tablet Commonly known as: GLUCOPHAGE Take 1,000 mg  by mouth 2 (two) times daily with a meal.   omega-3 acid ethyl esters 1 g capsule Commonly known as: LOVAZA Take 1 g by mouth daily.   OXYGEN Inhale 2 L into the lungs daily.   pantoprazole 40 MG tablet Commonly known as: PROTONIX Take 40 mg by mouth daily.   torsemide 20 MG tablet Commonly known as: DEMADEX Take 2 tablets (40 mg total) by mouth daily.   TRUEplus Insulin Syringe 29G X 1/2" 1 ML Misc Generic drug: INSULIN SYRINGE 1CC/29G   TRUEplus Pen Needles 31G X 8 MM Misc Generic drug: Insulin Pen Needle   valsartan 160 MG tablet Commonly known as: DIOVAN Take 160 mg by mouth daily.        Follow-up Information     Martie Round, NP. Schedule an appointment as soon as possible for a visit.   Specialty: Nurse Practitioner Why: Chf f/u Contact information: [address] [place] [address] [phone]         Gerlene Fee, PA-C. Schedule an appointment as soon as possible for a visit in 1 week(s).   Specialty: Cardiology Why: chf f/u Contact information: [address] [place] [address] [phone]                Discharge Exam: Raymond [name] Weights   09/09/22 1518 09/11/22 0534 09/12/22 0351  Weight: (!) 179.6 kg (!) 183.6 kg (!) 180.2 kg   GENERAL:  57 y.o.-year-old patient with no acute distress. Morbid obesity LUNGS: Normal breath sounds bilaterally, no wheezing CARDIOVASCULAR: S1, S2 normal. No murmur   ABDOMEN: Soft, nontender, nondistended. Bowel sounds present.  EXTREMITIES bilateral lower extremity chronic edema improving. Bilateral toe amputation's chronic NEUROLOGIC: nonfocal  patient is alert and awake  Condition at discharge: fair  The results of significant diagnostics from this hospitalization (including imaging, microbiology, ancillary and laboratory) are listed below for reference.   Imaging Studies: ECHOCARDIOGRAM COMPLETE  Result Date: 09/11/2022    ECHOCARDIOGRAM REPORT   Patient Name:   Raymond [name] [facility] Date of Exam: 09/10/2022 Medical Rec #:  [id number]                   Height:       74.0 in Accession #:    [id number]                  Weight:       396.0 lb Date of Birth:  [birth year]                   BSA:          2.906 m Patient Age:    57 years                    BP:           139/93 mmHg Patient Gender: M                           HR:           75 bpm. Exam Location:  [facility] Procedure: 2D Echo and Intracardiac Opacification Agent Indications:  CHF I50.31  History:         Patient has prior history of Echocardiogram examinations, most                  recent 04/28/2017.  Sonographer:     Overton Mam RDCS, FASE Referring Phys:  [id number] Vernetta Honey MANSY Diagnosing Phys: Alwyn Pea MD  Sonographer Comments: No apical window, no subcostal window and patient is obese. Image acquisition challenging due to patient body habitus and Image acquisition challenging due to respiratory motion. Very challenging study due to chest wall/ lung interference and patient requiring 5L of oxygen during this study. IMPRESSIONS  1. Left ventricular ejection fraction, by estimation, is 55 to 60%. The left ventricle has normal function. The left ventricle has no regional wall motion abnormalities. There is moderate left ventricular hypertrophy. Left ventricular diastolic function  could not be evaluated.  2. Right ventricular systolic function is normal. The right ventricular size is normal.  3. The mitral valve is normal in structure. Trivial mitral valve regurgitation.  4. The aortic valve is normal in structure. Aortic valve regurgitation is not visualized. FINDINGS  Left Ventricle: Left ventricular ejection fraction, by estimation, is 55 to 60%. The left ventricle has normal function. The left ventricle has no regional wall motion abnormalities. Definity contrast agent was given IV to delineate the left ventricular  endocardial borders. The left ventricular internal cavity size was normal in size. There is  moderate left ventricular hypertrophy. Left ventricular diastolic function could not be evaluated. Right Ventricle: The right ventricular size is normal. No increase in right ventricular wall thickness. Right ventricular systolic function is normal. Left Atrium: Left atrial size was normal in size. Right Atrium: Right atrial size was normal in size. Pericardium: There is no evidence of pericardial effusion. Mitral Valve: The mitral valve is normal in structure. Trivial mitral valve regurgitation. Tricuspid Valve: The tricuspid valve is normal in structure. Tricuspid valve regurgitation is mild. Aortic Valve: The aortic valve is normal in structure. Aortic valve regurgitation is not visualized. Pulmonic Valve: The pulmonic valve was normal in structure. Pulmonic valve regurgitation is not visualized. Aorta: The ascending aorta was not well visualized. IAS/Shunts: No atrial level shunt detected by color flow Doppler.  LEFT VENTRICLE PLAX 2D LVIDd:         5.30 cm LVIDs:         3.60 cm LV PW:         1.50 cm LV IVS:        1.40 cm LVOT diam:     2.20 cm LVOT Area:     3.80 cm  LEFT ATRIUM         Index LA diam:    3.60 cm 1.24 cm/m                        PULMONIC VALVE AORTA                 PV Vmax:        1.59 m/s Ao Root diam: 3.60 cm PV Peak grad:   10.1 mmHg Ao Asc diam:  3.50 cm RVOT Peak grad: 6 mmHg   SHUNTS Systemic Diam: 2.20 cm Alwyn Pea MD Electronically signed by Alwyn Pea MD Signature Date/Time: 09/11/2022/5:32:23 PM    Final    DG Chest 2 View  Result Date: 09/09/2022 CLINICAL DATA:  Chest pain EXAM: CHEST - 2 VIEW COMPARISON:  X-ray 02/18/2022 FINDINGS: Under  penetrated radiographs. Underinflation basilar atelectasis or scarring bilaterally. Enlarged cardiopericardial silhouette with slight prominence of the central vasculature. No pneumothorax, edema or consolidation. Overlapping cardiac leads. Motion on lateral view. IMPRESSION: Limited x-rays. Enlarged heart with some central  vascular congestion. Basilar bilateral linear areas of density, possibly atelectasis. Electronically Signed   By: Karen Kays M.D.   On: 09/09/2022 16:13    Microbiology:   Labs: CBC: Recent Labs  Lab 09/09/22 1516 09/10/22 0537  WBC 9.0 9.5  HGB 11.2* 11.3*  HCT 38.6* 38.7*  MCV 83.2 81.8  PLT 312 307   Basic Metabolic Panel: Recent Labs  Lab 09/09/22 1516 09/10/22 0537 09/11/22 0427  NA 147* 145 142  K 3.5 3.3* 3.7  CL 106 106 103  CO2 30 32 29  GLUCOSE 73 73 141*  BUN 16 14 18   CREATININE 1.03 0.96 1.08  CALCIUM 8.5* 8.6* 8.6*  MG  --   --  2.4   CBG: Recent Labs  Lab 09/09/22 1516 09/10/22 2105  GLUCAP 71 171*    Discharge time spent: greater than 30 minutes.  Signed: [name], MD [facility] 09/12/2022

## 2022-09-15 ENCOUNTER — Encounter: Payer: Medicare HMO | Admitting: Physician Assistant

## 2022-09-15 DIAGNOSIS — Z89411 Acquired absence of right great toe: Secondary | ICD-10-CM | POA: Diagnosis not present

## 2022-09-15 DIAGNOSIS — Z89412 Acquired absence of left great toe: Secondary | ICD-10-CM | POA: Diagnosis not present

## 2022-09-15 DIAGNOSIS — L97512 Non-pressure chronic ulcer of other part of right foot with fat layer exposed: Secondary | ICD-10-CM | POA: Diagnosis not present

## 2022-09-15 DIAGNOSIS — T8131XA Disruption of external operation (surgical) wound, not elsewhere classified, initial encounter: Secondary | ICD-10-CM | POA: Diagnosis present

## 2022-09-15 DIAGNOSIS — I110 Hypertensive heart disease with heart failure: Secondary | ICD-10-CM | POA: Diagnosis not present

## 2022-09-15 DIAGNOSIS — E11621 Type 2 diabetes mellitus with foot ulcer: Secondary | ICD-10-CM | POA: Diagnosis not present

## 2022-09-15 DIAGNOSIS — J449 Chronic obstructive pulmonary disease, unspecified: Secondary | ICD-10-CM | POA: Diagnosis not present

## 2022-09-15 DIAGNOSIS — I5042 Chronic combined systolic (congestive) and diastolic (congestive) heart failure: Secondary | ICD-10-CM | POA: Diagnosis not present

## 2022-09-15 DIAGNOSIS — X58XXXA Exposure to other specified factors, initial encounter: Secondary | ICD-10-CM | POA: Diagnosis not present

## 2022-09-15 NOTE — Progress Notes (Addendum)
[place], LUTH ([id number]) [id number] Page 1 of 10 Visit Report for 09/15/2022 Chief Complaint Document Details Patient Name: Date of Service: Raymond [name] 09/15/2022 9:45 A M Medical Record Number: [id number] Patient Account Number: [id number] Date of Birth/Sex: Treating RN: 1965/08/12 (57 y.o. Raymond [name] Primary Care Provider: Martie Round Other Clinician: Referring Provider: Treating Provider/Extender: Gwendlyn Deutscher in Treatment: 9 Information Obtained from: Patient Chief Complaint Left foot ulcer from surgical dehiscence following 2nd toe amputation right foot Electronic Signature(s) Signed: 09/15/2022 10:09:00 AM By: Allen Derry PA-C Entered By: Allen Derry on 09/15/2022 10:09:00 -------------------------------------------------------------------------------- Debridement Details Patient Name: Date of Service: Raymond Blood D. 09/15/2022 9:45 A M Medical Record Number: [id number] Patient Account Number: [id number] Date of Birth/Sex: Treating RN: August 13, 1965 (57 y.o. [name] Primary Care Provider: Martie Round Other Clinician: Referring Provider: Treating Provider/Extender: Gwendlyn Deutscher in Treatment: 9 Debridement Performed for Assessment: Wound #4 Right Amputation Site - Toe Performed By: Physician Allen Derry, PA-C Debridement Type: Debridement Level of Consciousness (Pre-procedure): Awake and Alert Pre-procedure Verification/Time Out Yes - 10:06 Taken: Start Time: 10:06 Pain Control: Other : insensate Percent of Wound Bed Debrided: 100% T Area Debrided (cm): otal 0.02 Tissue and other material debrided: Viable, Callus, Skin: Dermis , Skin: Epidermis Level: Skin/Epidermis Debridement Description: Selective/Open Wound Instrument: Curette Bleeding: None End Time: 10:06 Procedural Pain: Insensate Post Procedural Pain: Insensate Response to Treatment:  Procedure was tolerated well Lucia Gaskins ([id number]) [id number] Page 2 of 10 Level of Consciousness (Post- Awake and Alert procedure): Post Debridement Measurements of Total Wound Length: (cm) 0.3 Width: (cm) 0.1 Depth: (cm) 0.1 Volume: (cm) 0.002 Character of Wound/Ulcer Post Debridement: Improved Post Procedure Diagnosis Same as Pre-procedure Electronic Signature(s) Signed: 09/27/2022 2:54:39 PM By: Elliot Gurney, BSN, RN, CWS, Kim RN, BSN Signed: 12/15/2022 7:59:03 AM By: Allen Derry PA-C Previous Signature: 09/16/2022 1:41:07 PM Version By: Bonnell Public Previous Signature: 09/16/2022 3:00:55 PM Version By: Allen Derry PA-C Entered By: Elliot Gurney BSN, RN, CWS, Kim on 09/27/2022 14:54:39 -------------------------------------------------------------------------------- HPI Details Patient Name: Date of Service: Raymond Blood D. 09/15/2022 9:45 A M Medical Record Number: [id number] Patient Account Number: [id number] Date of Birth/Sex: Treating RN: 14-Sep-1965 (57 y.o. Raymond [name] Primary Care Provider: Martie Round Other Clinician: Referring Provider: Treating Provider/Extender: Gwendlyn Deutscher in Treatment: 9 History of Present Illness HPI Description: 09/05/17-He is seeing an initial evaluation for a left plantar foot ulcer. He has a remote history of left great toe amputation. He states that 4-6 weeks ago he noted callus formation and ulceration. He has not seen primary care regarding this. He is not currently on antibiotic therapy. He does not routinely follow with podiatry. He states diabetic foot wear will arrive early next week. The EHR shows an A1c of 9% approximate 4 months ago but he states he had one and primary care a few weeks ago but does not know the results. He is neuropathic and does not complain of any pain, he is currently wearing crocs. 09/12/17-he is here in follow up evaluation for left plantar foot ulcer.  There is improvement in both appearance and measurement. We will continue with same treatment plan and he will follow up next week. 09/19/17 on evaluation today patient actually appears to be doing excellent in regard to his ulcer on the invitation site of his left great foot plantar aspect. He's been tolerating the dressing changes without complication. In fact with the Prisma and the  current measures he has been shown signs of excellent improvement week by week up to this point. We have been to breeding the wound in this seems to have been of great benefit for him. Fortunately there is no evidence of infection. 09/26/17 on evaluation today patient appears to be doing rather well in regard to his wound. He did not know quite as much improvement this week as compared to last week. Nonetheless he still continues to show signs of improving to some degree. I do believe he may benefit from an offloading shoe. No fevers, chills, nausea, or vomiting noted at this time. 10/03/17 on evaluation today patient actually appears to be doing much better in regard to the amputation site plantar foot ulcer. Overall this appears significantly smaller even compared to previous. He's been tolerating the dressing changes without complication. He did get his diabetic shoes and I did have a look at them they appear to be fairly good. Nonetheless I do think that for the time being I would probably recommend he continue with the offloading shoe that we have been utilizing just due to the fact that with the dressing I don't know that his diabetic shoes are going to work as appropriately as far as not called an additional pressure and irritation to the area in question. He understands. 10/10/17 on evaluation today patient actually appears to be doing very well in regard to his plantar foot ulcer. He has been tolerating the dressing changes without complication. I do feel like he's making signs of good improvement and in fact of the  wound bed appears to be better although it may not be significantly changed in size it appears healthier and I do believe is showing signs of improving. Nonetheless we gonna keep working towards healing as far as that is concerned. There is no evidence of infection. 10/17/17 on evaluation today patient actually appears to be doing poorly in regard to his plantar foot ulcer. Unfortunately other than just the area where the wound is itself there appears to be a blister that communicates with the wound unfortunately. This is more lateral to the wound itself and also to the distal point of the amputation site. There does not appear to be any evidence of cellulitis spreading at the foot but I do believe some of the drainage from the site itself is. When in nature. Nonetheless this blister area I think needs to be removed in order to allow for appropriate healing of the ulcer itself I think that is gonna be difficult for it to heal otherwise. This was discussed with the patient today. 10/24/17 on evaluation today patient actually appears to be doing better compared to last week even post debridement. He has shown signs of improvement he DRAYVEN, GRILL ([id number]) [id number] Page 3 of 10 did test positive for Staphylococcus aureus. With that being said he notes that he's not have any discomfort and in general he does feel like things seem to been doing better. Fortunately there's no additional blistering and no evidence of remaining infection at this time. No fevers, chills, nausea, or vomiting noted at this time. He has three days of antibiotic left. 10/31/17 on evaluation today patient actually appears to be doing very well in regard to his ulcer on the left foot. He has been tolerating the dressing changes without complication. With that being said fortunately there appears to be no evidence of infection I do think he's made progress compared to previous. No fevers,  chills, nausea, or vomiting  noted at this time. 11/14/17 on evaluation today patient actually appears to be doing excellent in regard to his amputation site ulcer on his foot. In fact this appears to be completely healed at this point. There is no evidence of infection nor underline abscess and I did thoroughly evaluate the periwound location. Overall I'm very pleased with how things appear. Readmission: 01-03-2022 upon evaluation today patient appears for reevaluation here in the clinic although it has been since 2019 that I last saw him. This again has been over 4 years. With that being said he is having an issue with a wound on his foot unfortunately. He has not had any recent x-rays he tells me at this point which is unfortunate as avoid definitely can need to get something started that can be one of the primary items to get moving forward with. With that being said I also think were probably can obtain a wound culture he is probably going to require some antibiotics at some point. I just want to make sure that we have him on the right thing when we do this. Currently the patient tells me that his wounds have been present in regard to the met head for about a month at this point. With that being said he also has a wound on the third toe which is the next toe this actually still present. This unfortunately has a lot of callus buildup around it but I think it is larger underneath than what it would appear on initial inspection. Patient does have a history of diabetes mellitus type 2, congestive heart failure, and COPD. 01-11-2022 upon evaluation patient's wounds actually are showing signs of doing about the same may be just slightly cleaner but definitely not where we want things to be as of yet. Fortunately there does not appear to be any signs of active infection locally nor systemically at this point which is great news. No fevers, chills, nausea, vomiting, or diarrhea. Of note patient has had  his MRI but we do not have the results of that as of yet we are still waiting for the reading on this at this point. 01-18-2022 upon evaluation today patient presents after having had his MRI finally complete. 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 The third and fourth digits seem to be early which is good news. Nonetheless we are still going to need to try to see about getting the infection under control he is already been on the antibiotic therapy which I think has done quite well. In fact I feel like he is showing signs of improvement already. 12/28; patient with a wound on the medial aspect of the left first metatarsal head and an area on the left third toe with slight hammer deformity. He has had a previous left first toe amputation. We are using Iodoflex on the wound. He is a diabetic a recent MRI showed osteomyelitis we have been giving him Cipro and Doxy which he appears to be tolerating well. 02-10-2022 upon evaluation today patient unfortunately has issues still with open wounds of his foot on the left. He does have known osteomyelitis at this point and that is something that we have been treating with oral antibiotics. I actually have not seen him personally since 19 December. In that time he has not had any debridement from that point until today based on what I can see as best I can tell anyway. Has been on  Cipro as well as doxycycline. Nonetheless he does still seem to have open wounds today and I think that he would benefit from hyperbaric oxygen therapy. I discussed that with him today as well. 02-18-2022 upon evaluation today patient appears to be doing about the same in regard to his wounds. Fortunately I do not see any signs of infection although he still has areas that seem to initially close down but that he has a lot of callus that still open at both locations most on the metatarsal region as well as the toe. I  think that he is appropriate for proceeding with the hyperbarics and I think the sooner we get this done to try to get these wounds healed the better off he will be. 02-25-2022 upon evaluation today patient's wounds do show signs of being dry get again. We were attempting to pack and some of the alginate dressing instead of doing the collagen as everything was getting very dry but nonetheless this is still continue to be an ongoing issue. My suggestion based on what I am seeing is good to be that we actually switch to doing Xeroform which should hopefully keep this a little bit more moist and from hopefully closing up prematurely. The patient voiced understanding. 03-03-2022 upon evaluation today patient appears to be doing well with regard to his wounds things as before appear to be getting dry. We are using Xeroform to try to keep it open is much as possible but each time I see him he does tend to cover over the good news is each time he also seems to be getting a little bit smaller which is excellent. Fortunately I do not see any evidence of active infection locally nor systemically which is great news. I think this means that between the antibiotics and the hyperbarics were really on a good track here. 03-10-2022 upon evaluation today patient appears to be doing well currently in regard to his wounds. He is actually showing signs of some good improvement he is also tolerating HBO therapy very well. I do not see any evidence of active infection locally nor systemically which is great news and overall I am extremely pleased in that regard. No fevers, chills, nausea, vomiting, or diarrhea. 03-17-2022 upon evaluation today patient appears to be doing well currently in regard to his wounds in fact the toe is completely healed. The metatarsal head location though not completely healed appears to be doing much better which is great news and overall I am extremely pleased with where we stand today. 03-24-2022  upon evaluation today patient appears to be doing well currently in regard to his wounds. He has been tolerating the dressing changes without complication. Fortunately there does not appear to be any signs of active infection locally nor systemically in fact I am not so sure this wound is very close to being healed. I did perform some debridement to clearway some of the callus and the patient tolerated this today without complication. Postdebridement the wound bed is significantly improved. 03-31-2022 upon evaluation today patient appears to be doing well currently in regard to his wound which is actually showing signs of being completely healed. This is great news. Fortunately I do believe that the patient is doing much better he still on the oral antibiotics which I think is helping to treat the osteomyelitis is also undergoing hyperbaric oxygen therapy which I think is doing a really good job here as well. 04-21-2022 upon evaluation today patient continues to remain completely closed.  He seems to be doing excellent and is very close to completion of his 40 treatments of HBO therapy which that is the point where recommend discontinue therapy at that point. Fortunately I do not see any evidence of active infection locally nor systemically at this time which is great news. Readmission: 07-08-2022 unfortunately Mr. Schierer since I last saw him ended up having an issue with osteomyelitis of his great toe right side which included having to have amputation. This amputation was performed actually on 06-08-2022 and was again amputated secondary to the osteomyelitis. With that being said following however this has dehisced and therefore the patient was referred to [place] for further evaluation and treatment of the dehisced surgical wound at the amputation site of the right great toe. His past medical history really has not changed significantly since I last saw him in the first of the year we will be doing  hyperbarics for the left foot we are able to get all this healed that still is doing well according to what the patient tells me at this point. 07-15-2022 upon evaluation today patient appears to be doing well currently in regard to his wound. This in fact is showing signs of good improvement in my opinion. Fortunately I do not see any evidence of active infection locally nor systemically which is great news. No fevers, chills, nausea, vomiting, or diarrhea. WHITTAKER, HLINKA ([id number]) [id number] Page 4 of 10 07-25-2022 upon evaluation today patient appears to be doing well currently in regard to his foot ulcer is actually measuring smaller and looking better I am actually pleased in that regard. We did get the results back from his pathology that showed necrotic bone but nothing that appears to be actually open at this point. Fortunately I do not see any signs of active infection at this time. 08-01-22 upon evaluation today patient appears to be doing well currently in regard to his wound which is showing signs of improvement I think we may want to switch over to collagen however. 08-08-2022 upon evaluation patient's wound is actually showing signs of significant improvement. Fortunately I do not see any signs of active infection at this time which is great news and in general I do believe that we are making headway towards complete closure which is great news as well. 08-15-2022 upon evaluation today patient is making excellent progress towards closure. This wound looks to be doing excellent. Is not nearly as deep as what it was and I think that he is making great progress towards where we want to be. I am very pleased at this time. 7/23; this is a right second toe amputation site. We have been using Promogran change every 2 nd day 09-01-2022 upon evaluation today patient appears to be doing excellent in regard to his wound on the foot. He has been tolerating the dressing  changes without complication and in general seems to be making really good progress towards complete closure. I do not see any signs of infection which is great news. No fevers, chills, nausea, vomiting, or diarrhea. 09-08-2022 upon evaluation today patient appears to be doing well currently in regard to his foot ulcer this is getting very close to complete resolution were not 100% there but again I am extremely pleased with where things stand currently. I do not see any evidence of active infection locally or systemically which is great news. No fevers, chills, nausea, vomiting, or diarrhea. 09-15-2022 upon evaluation today patient appears to be doing excellent in regard to his  wound. Has been tolerating the dressing changes without complication. Fortunately there does not appear to be any signs of active infection at this time. Fortunately I do not see any evidence of worsening overall and I believe that the patient is making good headway towards complete closure Electronic Signature(s) Signed: 09/15/2022 10:09:10 AM By: Allen Derry PA-C Entered By: Allen Derry on 09/15/2022 10:09:09 -------------------------------------------------------------------------------- Physical Exam Details Patient Name: Date of Service: Raymond Blood D. 09/15/2022 9:45 A M Medical Record Number: [id number] Patient Account Number: [id number] Date of Birth/Sex: Treating RN: 11-25-1965 (57 y.o. Raymond [name] Primary Care Provider: Martie Round Other Clinician: Referring Provider: Treating Provider/Extender: Valerie Salts Weeks in Treatment: 9 Constitutional Well-nourished and well-hydrated in no acute distress. Respiratory normal breathing without difficulty. Psychiatric this patient is able to make decisions and demonstrates good insight into disease process. Alert and Oriented x 3. pleasant and cooperative. Notes Upon inspection patient's wound did require some sharp debridement. I  did perform debridement clearway some of the necrotic debris patient tolerated that today without complication and postdebridement wound bed appears to be doing much better which is great news. Electronic Signature(s) Signed: 09/15/2022 10:09:24 AM By: Allen Derry PA-C Entered By: Allen Derry on 09/15/2022 10:09:24 Lucia Gaskins ([id number]) [id number] Page 5 of 10 -------------------------------------------------------------------------------- Physician Orders Details Patient Name: Date of Service: Raymond [name] 09/15/2022 9:45 A M Medical Record Number: [id number] Patient Account Number: [id number] Date of Birth/Sex: Treating RN: Jul 14, 1965 (57 y.o. Raymond [name] Primary Care Provider: Martie Round Other Clinician: Referring Provider: Treating Provider/Extender: Gwendlyn Deutscher in Treatment: 9 Verbal / Phone Orders: No Diagnosis Coding ICD-10 Coding Code Description T81.31XA Disruption of external operation (surgical) wound, not elsewhere classified, initial encounter L97.512 Non-pressure chronic ulcer of other part of right foot with fat layer exposed E11.621 Type 2 diabetes mellitus with foot ulcer I50.42 Chronic combined systolic (congestive) and diastolic (congestive) heart failure J44.9 Chronic obstructive pulmonary disease, unspecified Follow-up Appointments Return Appointment in 1 week. Bathing/ Shower/ Hygiene May shower; gently cleanse wound with antibacterial soap, rinse and pat dry prior to dressing wounds Off-Loading Open toe surgical shoe Wound Treatment Wound #4 - Amputation Site - Toe Wound Laterality: Right Cleanser: Soap and Water 1 x Per Day/30 Days Discharge Instructions: Gently cleanse wound with antibacterial soap, rinse and pat dry prior to dressing wounds Prim Dressing: Xeroform-HBD 2x2 (in/in) 1 x Per Day/30 Days ary Discharge Instructions: Apply Xeroform-HBD 2x2 (in/in) as  directed Secondary Dressing: Gauze (Generic) 1 x Per Day/30 Days Discharge Instructions: As directed: dry, moistened with saline or moistened with Dakins Solution Secured With: Medipore T - 52M Medipore H Soft Cloth Surgical T ape ape, 2x2 (in/yd) (Generic) 1 x Per Day/30 Days Secured With: Kerlix Roll Sterile or Non-Sterile 6-ply 4.5x4 (yd/yd) (Generic) 1 x Per Day/30 Days Discharge Instructions: Apply Kerlix as directed Electronic Signature(s) Signed: 09/16/2022 1:41:07 PM By: Bonnell Public Signed: 09/16/2022 3:00:55 PM By: Allen Derry PA-C Entered By: Bonnell Public on 09/15/2022 10:11:20 Lucia Gaskins ([id number]) [id number] Page 6 of 10 -------------------------------------------------------------------------------- Problem List Details Patient Name: Date of Service: Raymond [name]. 09/15/2022 9:45 A M Medical Record Number: [id number] Patient Account Number: [id number] Date of Birth/Sex: Treating RN: Feb 25, 1965 (57 y.o. Raymond [name] Primary Care Provider: Martie Round Other Clinician: Referring Provider: Treating Provider/Extender: Gwendlyn Deutscher in Treatment: 9 Active Problems ICD-10 Encounter Code Description Active Date MDM Diagnosis T81.31XA Disruption of external operation (surgical) wound,  not elsewhere classified, 07/08/2022 No Yes initial encounter L97.512 Non-pressure chronic ulcer of other part of right foot with fat layer exposed 07/08/2022 No Yes E11.621 Type 2 diabetes mellitus with foot ulcer 07/08/2022 No Yes I50.42 Chronic combined systolic (congestive) and diastolic (congestive) heart failure 07/08/2022 No Yes J44.9 Chronic obstructive pulmonary disease, unspecified 07/08/2022 No Yes Inactive Problems Resolved Problems Electronic Signature(s) Signed: 09/15/2022 10:03:46 AM By: Allen Derry PA-C Entered By: Allen Derry on 09/15/2022  10:03:46 -------------------------------------------------------------------------------- Progress Note Details Patient Name: Date of Service: Raymond Blood D. 09/15/2022 9:45 A M Medical Record Number: [id number] Patient Account Number: [id number] Date of Birth/Sex: Treating RN: 05/30/1965 (57 y.o. [address] [place], [place] D ([id number]) [id number] Page 7 of 10 Primary Care Provider: Martie Round Other Clinician: Referring Provider: Treating Provider/Extender: Gwendlyn Deutscher in Treatment: 9 Subjective Chief Complaint Information obtained from Patient Left foot ulcer from surgical dehiscence following 2nd toe amputation right foot History of Present Illness (HPI) 09/05/17-He is seeing an initial evaluation for a left plantar foot ulcer. He has a remote history of left great toe amputation. He states that 4-6 weeks ago he noted callus formation and ulceration. He has not seen primary care regarding this. He is not currently on antibiotic therapy. He does not routinely follow with podiatry. He states diabetic foot wear will arrive early next week. The EHR shows an A1c of 9% approximate 4 months ago but he states he had one and primary care a few weeks ago but does not know the results. He is neuropathic and does not complain of any pain, he is currently wearing crocs. 09/12/17-he is here in follow up evaluation for left plantar foot ulcer. There is improvement in both appearance and measurement. We will continue with same treatment plan and he will follow up next week. 09/19/17 on evaluation today patient actually appears to be doing excellent in regard to his ulcer on the invitation site of his left great foot plantar aspect. He's been tolerating the dressing changes without complication. In fact with the Prisma and the current measures he has been shown signs of excellent improvement week by week up to this point. We have  been to breeding the wound in this seems to have been of great benefit for him. Fortunately there is no evidence of infection. 09/26/17 on evaluation today patient appears to be doing rather well in regard to his wound. He did not know quite as much improvement this week as compared to last week. Nonetheless he still continues to show signs of improving to some degree. I do believe he may benefit from an offloading shoe. No fevers, chills, nausea, or vomiting noted at this time. 10/03/17 on evaluation today patient actually appears to be doing much better in regard to the amputation site plantar foot ulcer. Overall this appears significantly smaller even compared to previous. He's been tolerating the dressing changes without complication. He did get his diabetic shoes and I did have a look at them they appear to be fairly good. Nonetheless I do think that for the time being I would probably recommend he continue with the offloading shoe that we have been utilizing just due to the fact that with the dressing I don't know that his diabetic shoes are going to work as appropriately as far as not called an additional pressure and irritation to the area in question. He understands. 10/10/17 on evaluation today patient actually appears to be doing very well in regard to his plantar  foot ulcer. He has been tolerating the dressing changes without complication. I do feel like he's making signs of good improvement and in fact of the wound bed appears to be better although it may not be significantly changed in size it appears healthier and I do believe is showing signs of improving. Nonetheless we gonna keep working towards healing as far as that is concerned. There is no evidence of infection. 10/17/17 on evaluation today patient actually appears to be doing poorly in regard to his plantar foot ulcer. Unfortunately other than just the area where the wound is itself there appears to be a blister that communicates  with the wound unfortunately. This is more lateral to the wound itself and also to the distal point of the amputation site. There does not appear to be any evidence of cellulitis spreading at the foot but I do believe some of the drainage from the site itself is. When in nature. Nonetheless this blister area I think needs to be removed in order to allow for appropriate healing of the ulcer itself I think that is gonna be difficult for it to heal otherwise. This was discussed with the patient today. 10/24/17 on evaluation today patient actually appears to be doing better compared to last week even post debridement. He has shown signs of improvement he did test positive for Staphylococcus aureus. With that being said he notes that he's not have any discomfort and in general he does feel like things seem to been doing better. Fortunately there's no additional blistering and no evidence of remaining infection at this time. No fevers, chills, nausea, or vomiting noted at this time. He has three days of antibiotic left. 10/31/17 on evaluation today patient actually appears to be doing very well in regard to his ulcer on the left foot. He has been tolerating the dressing changes without complication. With that being said fortunately there appears to be no evidence of infection I do think he's made progress compared to previous. No fevers, chills, nausea, or vomiting noted at this time. 11/14/17 on evaluation today patient actually appears to be doing excellent in regard to his amputation site ulcer on his foot. In fact this appears to be completely healed at this point. There is no evidence of infection nor underline abscess and I did thoroughly evaluate the periwound location. Overall I'm very pleased with how things appear. Readmission: 01-03-2022 upon evaluation today patient appears for reevaluation here in the clinic although it has been since 2019 that I last saw him. This again has been over 4 years.  With that being said he is having an issue with a wound on his foot unfortunately. He has not had any recent x-rays he tells me at this point which is unfortunate as avoid definitely can need to get something started that can be one of the primary items to get moving forward with. With that being said I also think were probably can obtain a wound culture he is probably going to require some antibiotics at some point. I just want to make sure that we have him on the right thing when we do this. Currently the patient tells me that his wounds have been present in regard to the met head for about a month at this point. With that being said he also has a wound on the third toe which is the next toe this actually still present. This unfortunately has a lot of callus buildup around it but I think it is larger underneath  than what it would appear on initial inspection. Patient does have a history of diabetes mellitus type 2, congestive heart failure, and COPD. 01-11-2022 upon evaluation patient's wounds actually are showing signs of doing about the same may be just slightly cleaner but definitely not where we want things to be as of yet. Fortunately there does not appear to be any signs of active infection locally nor systemically at this point which is great news. No fevers, chills, nausea, vomiting, or diarrhea. Of note patient has had his MRI but we do not have the results of that as of yet we are still waiting for the reading on this at this point. 01-18-2022 upon evaluation today patient presents after having had his MRI finally complete. 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 The third and fourth digits seem to be early which is good news. Nonetheless we are still going to need to try to see about getting the infection under control he is already been on the antibiotic therapy which I think has done quite well. In fact I feel like he  is showing signs of improvement already. 12/28; patient with a wound on the medial aspect of the left first metatarsal head and an area on the left third toe with slight hammer deformity. He has had a previous left first toe amputation. We are using Iodoflex on the wound. He is a diabetic a recent MRI showed osteomyelitis we have been giving him Cipro and Doxy which he appears to be tolerating well. 02-10-2022 upon evaluation today patient unfortunately has issues still with open wounds of his foot on the left. He does have known osteomyelitis at this point and that is something that we have been treating with oral antibiotics. I actually have not seen him personally since 19 December. In that time he has not had any debridement from that point until today based on what I can see as best I can tell anyway. Has been on Cipro as well as doxycycline. Nonetheless he does still seem to have open wounds today and I think that he would benefit from hyperbaric oxygen therapy. I discussed that with him today as well. MANCE, BRANSCOMB ([id number]) [id number] Page 8 of 10 02-18-2022 upon evaluation today patient appears to be doing about the same in regard to his wounds. Fortunately I do not see any signs of infection although he still has areas that seem to initially close down but that he has a lot of callus that still open at both locations most on the metatarsal region as well as the toe. I think that he is appropriate for proceeding with the hyperbarics and I think the sooner we get this done to try to get these wounds healed the better off he will be. 02-25-2022 upon evaluation today patient's wounds do show signs of being dry get again. We were attempting to pack and some of the alginate dressing instead of doing the collagen as everything was getting very dry but nonetheless this is still continue to be an ongoing issue. My suggestion based on what I am seeing is good to  be that we actually switch to doing Xeroform which should hopefully keep this a little bit more moist and from hopefully closing up prematurely. The patient voiced understanding. 03-03-2022 upon evaluation today patient appears to be doing well with regard to his wounds things as before appear to be getting dry. We are using Xeroform to  try to keep it open is much as possible but each time I see him he does tend to cover over the good news is each time he also seems to be getting a little bit smaller which is excellent. Fortunately I do not see any evidence of active infection locally nor systemically which is great news. I think this means that between the antibiotics and the hyperbarics were really on a good track here. 03-10-2022 upon evaluation today patient appears to be doing well currently in regard to his wounds. He is actually showing signs of some good improvement he is also tolerating HBO therapy very well. I do not see any evidence of active infection locally nor systemically which is great news and overall I am extremely pleased in that regard. No fevers, chills, nausea, vomiting, or diarrhea. 03-17-2022 upon evaluation today patient appears to be doing well currently in regard to his wounds in fact the toe is completely healed. The metatarsal head location though not completely healed appears to be doing much better which is great news and overall I am extremely pleased with where we stand today. 03-24-2022 upon evaluation today patient appears to be doing well currently in regard to his wounds. He has been tolerating the dressing changes without complication. Fortunately there does not appear to be any signs of active infection locally nor systemically in fact I am not so sure this wound is very close to being healed. I did perform some debridement to clearway some of the callus and the patient tolerated this today without complication. Postdebridement the wound bed is significantly  improved. 03-31-2022 upon evaluation today patient appears to be doing well currently in regard to his wound which is actually showing signs of being completely healed. This is great news. Fortunately I do believe that the patient is doing much better he still on the oral antibiotics which I think is helping to treat the osteomyelitis is also undergoing hyperbaric oxygen therapy which I think is doing a really good job here as well. 04-21-2022 upon evaluation today patient continues to remain completely closed. He seems to be doing excellent and is very close to completion of his 40 treatments of HBO therapy which that is the point where recommend discontinue therapy at that point. Fortunately I do not see any evidence of active infection locally nor systemically at this time which is great news. Readmission: 07-08-2022 unfortunately Mr. Gravett since I last saw him ended up having an issue with osteomyelitis of his great toe right side which included having to have amputation. This amputation was performed actually on 06-08-2022 and was again amputated secondary to the osteomyelitis. With that being said following however this has dehisced and therefore the patient was referred to [place] for further evaluation and treatment of the dehisced surgical wound at the amputation site of the right great toe. His past medical history really has not changed significantly since I last saw him in the first of the year we will be doing hyperbarics for the left foot we are able to get all this healed that still is doing well according to what the patient tells me at this point. 07-15-2022 upon evaluation today patient appears to be doing well currently in regard to his wound. This in fact is showing signs of good improvement in my opinion. Fortunately I do not see any evidence of active infection locally nor systemically which is great news. No fevers, chills, nausea, vomiting, or diarrhea. 07-25-2022 upon evaluation  today patient appears to  be doing well currently in regard to his foot ulcer is actually measuring smaller and looking better I am actually pleased in that regard. We did get the results back from his pathology that showed necrotic bone but nothing that appears to be actually open at this point. Fortunately I do not see any signs of active infection at this time. 08-01-22 upon evaluation today patient appears to be doing well currently in regard to his wound which is showing signs of improvement I think we may want to switch over to collagen however. 08-08-2022 upon evaluation patient's wound is actually showing signs of significant improvement. Fortunately I do not see any signs of active infection at this time which is great news and in general I do believe that we are making headway towards complete closure which is great news as well. 08-15-2022 upon evaluation today patient is making excellent progress towards closure. This wound looks to be doing excellent. Is not nearly as deep as what it was and I think that he is making great progress towards where we want to be. I am very pleased at this time. 7/23; this is a right second toe amputation site. We have been using Promogran change every 2 nd day 09-01-2022 upon evaluation today patient appears to be doing excellent in regard to his wound on the foot. He has been tolerating the dressing changes without complication and in general seems to be making really good progress towards complete closure. I do not see any signs of infection which is great news. No fevers, chills, nausea, vomiting, or diarrhea. 09-08-2022 upon evaluation today patient appears to be doing well currently in regard to his foot ulcer this is getting very close to complete resolution were not 100% there but again I am extremely pleased with where things stand currently. I do not see any evidence of active infection locally or systemically which is great news. No fevers, chills, nausea,  vomiting, or diarrhea. 09-15-2022 upon evaluation today patient appears to be doing excellent in regard to his wound. Has been tolerating the dressing changes without complication. Fortunately there does not appear to be any signs of active infection at this time. Fortunately I do not see any evidence of worsening overall and I believe that the patient is making good headway towards complete closure Objective Constitutional Well-nourished and well-hydrated in no acute distress. Vitals Time Taken: 9:50 AM, Height: 74 in, Weight: 398 lbs, BMI: 51.1, Temperature: 98.1 F, Pulse: 89 bpm, Respiratory Rate: 18 breaths/min, Blood Pressure: 154/74 mmHg. DANI, MIKRUT ([id number]) [id number] Page 9 of 10 Respiratory normal breathing without difficulty. Psychiatric this patient is able to make decisions and demonstrates good insight into disease process. Alert and Oriented x 3. pleasant and cooperative. General Notes: Upon inspection patient's wound did require some sharp debridement. I did perform debridement clearway some of the necrotic debris patient tolerated that today without complication and postdebridement wound bed appears to be doing much better which is great news. Integumentary (Hair, Skin) Wound #4 status is Open. Original cause of wound was Surgical Injury. The date acquired was: 06/09/2022. The wound has been in treatment 9 weeks. The wound is located on the Right Amputation Site - T The wound measures 0.3cm length x 0.1cm width x 0.1cm depth; 0.024cm^2 area and 0.002cm^3 volume. The oe. wound is limited to skin breakdown. There is a none present amount of drainage noted. The wound margin is distinct with the outline attached to the wound base. There is no granulation within the  wound bed. There is no necrotic tissue within the wound bed. Assessment Active Problems ICD-10 Disruption of external operation (surgical) wound, not elsewhere classified,  initial encounter Non-pressure chronic ulcer of other part of right foot with fat layer exposed Type 2 diabetes mellitus with foot ulcer Chronic combined systolic (congestive) and diastolic (congestive) heart failure Chronic obstructive pulmonary disease, unspecified Procedures Wound #4 Pre-procedure diagnosis of Wound #4 is an Open Surgical Wound located on the Right Amputation Site - T . There was a Selective/Open Wound oe Skin/Epidermis Debridement with a total area of 0.02 sq cm performed by Allen Derry, PA-C. With the following instrument(s): Curette to remove Viable tissue/material. Material removed includes Callus, Skin: Dermis, and Skin: Epidermis after achieving pain control using Other (insensate). No specimens were taken. A time out was conducted at 10:06, prior to the start of the procedure. There was no bleeding. The procedure was tolerated well with a pain level of Insensate throughout and a pain level of Insensate following the procedure. Post Debridement Measurements: 0.3cm length x 0.1cm width x 0.1cm depth; 0.002cm^3 volume. Character of Wound/Ulcer Post Debridement is improved. Post procedure Diagnosis Wound #4: Same as Pre-Procedure Plan Follow-up Appointments: Return Appointment in 1 week. Bathing/ Shower/ Hygiene: May shower; gently cleanse wound with antibacterial soap, rinse and pat dry prior to dressing wounds Off-Loading: Open toe surgical shoe WOUND #4: - Amputation Site - T oe Wound Laterality: Right Cleanser: Soap and Water 1 x Per Day/30 Days Discharge Instructions: Gently cleanse wound with antibacterial soap, rinse and pat dry prior to dressing wounds Prim Dressing: Xeroform-HBD 2x2 (in/in) 1 x Per Day/30 Days ary Discharge Instructions: Apply Xeroform-HBD 2x2 (in/in) as directed Secondary Dressing: Gauze (Generic) 1 x Per Day/30 Days Discharge Instructions: As directed: dry, moistened with saline or moistened with Dakins Solution Secured With: Medipore  T - 31M Medipore H Soft Cloth Surgical T ape ape, 2x2 (in/yd) (Generic) 1 x Per Day/30 Days Secured With: Kerlix Roll Sterile or Non-Sterile 6-ply 4.5x4 (yd/yd) (Generic) 1 x Per Day/30 Days Discharge Instructions: Apply Kerlix as directed 1. I am going to recommend that we have the patient continue to monitor for any signs of infection or worsening. Based on what I see I do believe that the patient is making excellent headway towards closure. 2. I am also can recommend that the patient should continue to use a dressing daily I would recommend Xeroform as the collagen seems to be drying out in place. We will see patient back for reevaluation in 1 week here in the clinic. If anything worsens or changes patient will contact our office for additional recommendations. Electronic Signature(s) Signed: 09/27/2022 2:55:51 PM By: Elliot Gurney, BSN, RN, CWS, Kim RN, BSN Signed: 12/15/2022 7:59:03 AM By: Aretta Nip, Saxon D7:59:03 AM By: Allen Derry PA-C Signed: 12/15/2022 ([id number]) [id number] Page 10 of 10 Previous Signature: 09/27/2022 2:53:20 PM Version By: Elliot Gurney BSN, RN, CWS, Kim RN, BSN Previous Signature: 09/15/2022 10:09:49 AM Version By: Allen Derry PA-C Entered By: Elliot Gurney BSN, RN, CWS, Kim on 09/27/2022 14:55:50 -------------------------------------------------------------------------------- SuperBill Details Patient Name: Date of Service: [name], [name] 09/15/2022 Medical Record Number: [id number] Patient Account Number: [id number] Date of Birth/Sex: Treating RN: 09-Jan-1966 (57 y.o. Raymond [name] Primary Care Provider: Martie Round Other Clinician: Referring Provider: Treating Provider/Extender: Gwendlyn Deutscher in Treatment: 9 Diagnosis Coding ICD-10 Codes Code Description T81.31XA Disruption of external operation (surgical) wound, not elsewhere classified, initial encounter L97.512 Non-pressure chronic ulcer of  other part of  right foot with fat layer exposed E11.621 Type 2 diabetes mellitus with foot ulcer I50.42 Chronic combined systolic (congestive) and diastolic (congestive) heart failure J44.9 Chronic obstructive pulmonary disease, unspecified Facility Procedures : CPT4 Code: 16109604 Description: 54098 - DEBRIDE WOUND 1ST 20 SQ CM OR < ICD-10 Diagnosis Description L97.512 Non-pressure chronic ulcer of other part of right foot with fat layer exposed Modifier: Quantity: 1 Physician Procedures : CPT4 Code Description Modifier [id number] - WC PHYS DEBR WO ANESTH 20 SQ CM ICD-10 Diagnosis Description L97.512 Non-pressure chronic ulcer of other part of right foot with fat layer exposed Quantity: 1 Electronic Signature(s) Signed: 09/15/2022 10:15:50 AM By: Allen Derry PA-C Entered By: Allen Derry on 09/15/2022 10:15:49

## 2022-09-16 NOTE — Progress Notes (Signed)
HEYWARD, LENOIR ([id number]) [id number] Page 1 of 9 Visit Report for 09/15/2022 Arrival Information Details Patient Name: Date of Service: Raymond [name] 09/15/2022 9:45 A M Medical Record Number: [id number] Patient Account Number: [id number] Date of Birth/Sex: Treating RN: 1965-12-29 (57 y.o. [name], [name] Primary Care Analyah Mcconnon: Martie Round Other Clinician: Referring Leanora Murin: Treating Rosario Duey/Extender: Gwendlyn Deutscher in Treatment: 9 Visit Information History Since Last Visit Added or deleted any medications: No Patient Arrived: Ambulatory Any new allergies or adverse reactions: No Arrival Time: 09:48 Hospitalized since last visit: Yes Accompanied By: self Pain Present Now: No Transfer Assistance: None Patient Identification Verified: Yes Secondary Verification Process Completed: Yes Patient Requires Transmission-Based Precautions: No Patient Has Alerts: Yes Patient Alerts: ABI R 1.37 L1.26 Electronic Signature(s) Signed: 09/16/2022 1:41:07 PM By: Bonnell Public Entered By: Bonnell Public on 09/15/2022 09:50:19 -------------------------------------------------------------------------------- Clinic Level of Care Assessment Details Patient Name: Date of Service: Raymond [name] 09/15/2022 9:45 A M Medical Record Number: [id number] Patient Account Number: [id number] Date of Birth/Sex: Treating RN: 1965-03-31 (57 y.o. [name], [name] Primary Care Shevy Yaney: Martie Round Other Clinician: Referring Bindi Klomp: Treating Blayze Haen/Extender: Gwendlyn Deutscher in Treatment: 9 Clinic Level of Care Assessment Items TOOL 1 Quantity Score []  - 0 Use when EandM and Procedure is performed on INITIAL visit ASSESSMENTS - Nursing Assessment / Reassessment []  - 0 General Physical Exam (combine w/ comprehensive assessment (listed just below) when performed on new pt. evals) []  - 0 Comprehensive  Assessment (HX, ROS, Risk Assessments, Wounds Hx, etc.) ASSESSMENTS - Wound and Skin Assessment / Reassessment []  - 0 Dermatologic / Skin Assessment (not related to wound area) ASSESSMENTS - Ostomy and/or Continence Assessment and Care Raymond, [name] ([id number]) [id number] Page 2 of 9 []  - 0 Incontinence Assessment and Management []  - 0 Ostomy Care Assessment and Management (repouching, etc.) PROCESS - Coordination of Care []  - 0 Simple Patient / Family Education for ongoing care []  - 0 Complex (extensive) Patient / Family Education for ongoing care []  - 0 Staff obtains Chiropractor, Records, T Results / Process Orders est []  - 0 Staff telephones HHA, Nursing Homes / Clarify orders / etc []  - 0 Routine Transfer to another Facility (non-emergent condition) []  - 0 Routine Hospital Admission (non-emergent condition) []  - 0 New Admissions / Manufacturing engineer / Ordering NPWT Apligraf, etc. , []  - 0 Emergency Hospital Admission (emergent condition) PROCESS - Special Needs []  - 0 Pediatric / Minor Patient Management []  - 0 Isolation Patient Management []  - 0 Hearing / Language / Visual special needs []  - 0 Assessment of Community assistance (transportation, D/C planning, etc.) []  - 0 Additional assistance / Altered mentation []  - 0 Support Surface(s) Assessment (bed, cushion, seat, etc.) INTERVENTIONS - Miscellaneous []  - 0 External ear exam []  - 0 Patient Transfer (multiple staff / Nurse, adult / Similar devices) []  - 0 Simple Staple / Suture removal (25 or less) []  - 0 Complex Staple / Suture removal (26 or more) []  - 0 Hypo/Hyperglycemic Management (do not check if billed separately) []  - 0 Ankle / Brachial Index (ABI) - do not check if billed separately Has the patient been seen at the hospital within the last three years: Yes Total Score: 0 Level Of Care: ____ Electronic Signature(s) Signed: 09/16/2022 1:41:07 PM By: Bonnell Public Entered By: Bonnell Public on 09/15/2022 10:15:16 -------------------------------------------------------------------------------- Encounter Discharge Information Details Patient Name: Date of Service: Raymond [name], Raymond Blalock M D. 09/15/2022 9:45 A Judie Petit  Medical Record Number: [id number] Patient Account Number: [id number] Date of Birth/Sex: Treating RN: Dec 17, 1965 (57 y.o. [name], [name] Primary Care Linda Biehn: Martie Round Other Clinician: Referring Guthrie Lemme: Treating Nai Borromeo/Extender: Gwendlyn Deutscher in Treatment: 9 Encounter Discharge Information Items Post Procedure Vitals Discharge Condition: Stable Temperature (F): 98.1 Raymond, [name] ([id number]) [id number] Page 3 of 9 Ambulatory Status: Ambulatory Pulse (bpm): 89 Discharge Destination: Home Respiratory Rate (breaths/min): 18 Transportation: Private Auto Blood Pressure (mmHg): 154/74 Schedule Follow-up Appointment: No Clinical Summary of Care: Electronic Signature(s) Signed: 09/16/2022 1:41:07 PM By: Bonnell Public Entered By: Bonnell Public on 09/15/2022 10:17:15 -------------------------------------------------------------------------------- Lower Extremity Assessment Details Patient Name: Date of Service: Raymond [name] 09/15/2022 9:45 A M Medical Record Number: [id number] Patient Account Number: [id number] Date of Birth/Sex: Treating RN: Jul 06, 1965 (57 y.o. [name], [name] Primary Care Maimouna Rondeau: Martie Round Other Clinician: Referring Kaycee Haycraft: Treating Shalla Bulluck/Extender: Valerie Salts Weeks in Treatment: 9 Edema Assessment Assessed: Raymond [name]: No] [Right: No] [Left: Edema] [Right: :] Calf Left: Right: Point of Measurement: 42 cm From Medial Instep 51 cm Ankle Left: Right: Point of Measurement: 13 cm From Medial Instep 36 cm Vascular Assessment Pulses: Dorsalis Pedis Palpable: [Right:No] Doppler Audible: [Right:Yes] Posterior  Tibial Palpable: [Right:No] Doppler Audible: [Right:Yes] Extremity colors, hair growth, and conditions: Hair Growth on Extremity: [Right:No] Temperature of Extremity: [Right:Warm] Capillary Refill: [Right:< 3 seconds No] Toe Nail Assessment Left: Right: Thick: Yes Discolored: Yes Deformed: Yes Improper Length and Hygiene: Yes Electronic Signature(s) Signed: 09/16/2022 1:41:07 PM By: Bonnell Public Entered By: Bonnell Public on 09/15/2022 10:00:00 Raymond [name] ([id number]) [id number] Page 4 of 9 -------------------------------------------------------------------------------- Multi Wound Chart Details Patient Name: Date of Service: Raymond [name]. 09/15/2022 9:45 A M Medical Record Number: [id number] Patient Account Number: [id number] Date of Birth/Sex: Treating RN: Jun 02, 1965 (57 y.o. [name], [name] Primary Care Ajia Chadderdon: Martie Round Other Clinician: Referring Cornie Mccomber: Treating Rylen Hou/Extender: Gwendlyn Deutscher in Treatment: 9 Vital Signs Height(in): 74 Pulse(bpm): 89 Weight(lbs): 398 Blood Pressure(mmHg): 154/74 Body Mass Index(BMI): 51.1 Temperature(F): 98.1 Respiratory Rate(breaths/min): 18 [4:Photos:] [N/A:N/A] Right Amputation Site - T oe N/A N/A Wound Location: Surgical Injury N/A N/A Wounding Event: Open Surgical Wound N/A N/A Primary Etiology: Congestive Heart Failure, N/A N/A Comorbid History: Hypertension, Type II Diabetes, Neuropathy 06/09/2022 N/A N/A Date Acquired: 9 N/A N/A Weeks of Treatment: Open N/A N/A Wound Status: No N/A N/A Wound Recurrence: 0.3x0.1x0.1 N/A N/A Measurements L x W x D (cm) 0.024 N/A N/A A (cm) : rea 0.002 N/A N/A Volume (cm) : 98.50% N/A N/A % Reduction in Area: 100.00% N/A N/A % Reduction in Volume: Full Thickness Without Exposed N/A N/A Classification: Support Structures None Present N/A N/A Exudate Amount: Distinct, outline attached N/A  N/A Wound Margin: None Present (0%) N/A N/A Granulation Amount: None Present (0%) N/A N/A Necrotic Amount: Fascia: No N/A N/A Exposed Structures: Fat Layer (Subcutaneous Tissue): No Tendon: No Muscle: No Joint: No Bone: No Limited to Skin Breakdown Small (1-33%) N/A N/A Epithelialization: Treatment Notes Electronic Signature(s) Signed: 09/16/2022 1:41:07 PM By: Bonnell Public Entered By: Bonnell Public on 09/15/2022 10:08:28 Raymond [name] ([id number]) [id number] Page 5 of 9 -------------------------------------------------------------------------------- Multi-Disciplinary Care Plan Details Patient Name: Date of Service: Raymond [name] 09/15/2022 9:45 A M Medical Record Number: [id number] Patient Account Number: [id number] Date of Birth/Sex: Treating RN: Oct 03, 1965 (57 y.o. [name], [name] Primary Care Azavion Bouillon: Martie Round Other Clinician: Referring Priyana Mccarey: Treating Jeb Schloemer/Extender: Gwendlyn Deutscher in Treatment: 9 Active  Inactive Wound/Skin Impairment Nursing Diagnoses: Impaired tissue integrity Knowledge deficit related to ulceration/compromised skin integrity Goals: Patient/caregiver will verbalize understanding of skin care regimen Date Initiated: 07/08/2022 Date Inactivated: 08/01/2022 Target Resolution Date: 08/06/2022 Goal Status: Met Ulcer/skin breakdown will have a volume reduction of 30% by week 4 Date Initiated: 07/08/2022 Date Inactivated: 08/01/2022 Target Resolution Date: 08/06/2022 Goal Status: Met Ulcer/skin breakdown will have a volume reduction of 50% by week 8 Date Initiated: 07/08/2022 Date Inactivated: 08/08/2022 Target Resolution Date: 09/07/2022 Goal Status: Met Ulcer/skin breakdown will have a volume reduction of 80% by week 12 Date Initiated: 07/08/2022 Target Resolution Date: 10/08/2022 Goal Status: Active Ulcer/skin breakdown will heal within 14 weeks Date Initiated: 07/08/2022 Target  Resolution Date: 10/22/2022 Goal Status: Active Interventions: Assess patient/caregiver ability to obtain necessary supplies Assess patient/caregiver ability to perform ulcer/skin care regimen upon admission and as needed Assess ulceration(s) every visit Provide education on ulcer and skin care Screen for HBO Treatment Activities: Referred to DME Bird Swetz for dressing supplies : 07/08/2022 Skin care regimen initiated : 07/08/2022 Topical wound management initiated : 07/08/2022 Notes: Electronic Signature(s) Signed: 09/16/2022 1:41:07 PM By: Bonnell Public Entered By: Bonnell Public on 09/15/2022 10:15:35 Raymond [name] ([id number]) [id number] Page 6 of 9 -------------------------------------------------------------------------------- Pain Assessment Details Patient Name: Date of Service: Raymond [name]. 09/15/2022 9:45 A M Medical Record Number: [id number] Patient Account Number: [id number] Date of Birth/Sex: Treating RN: 18-Jul-1965 (57 y.o. [name], [name] Primary Care Adaliah Hiegel: Martie Round Other Clinician: Referring Tiny Rietz: Treating Carrson Lightcap/Extender: Valerie Salts Weeks in Treatment: 9 Active Problems Location of Pain Severity and Description of Pain Patient Has Paino No Site Locations Pain Management and Medication Current Pain Management: Electronic Signature(s) Signed: 09/16/2022 1:41:07 PM By: Bonnell Public Entered By: Bonnell Public on 09/15/2022 09:50:54 -------------------------------------------------------------------------------- Patient/Caregiver Education Details Patient Name: Date of Service: Raymond [name]. 8/15/2024andnbsp9:45 A M Medical Record Number: [id number] Patient Account Number: [id number] Date of Birth/Gender: Treating RN: 11/17/1965 (57 y.o. [name], [name] Primary Care Physician: Martie Round Other Clinician: Referring Physician: Treating Physician/Extender: Gwendlyn Deutscher in Treatment: [address] [place] ([id number]) [id number] Page 7 of 9 Education Assessment Education Provided To: Patient Education Topics Provided Wound/Skin Impairment: Methods: Explain/Verbal Responses: State content correctly Electronic Signature(s) Signed: 09/16/2022 1:41:07 PM By: Bonnell Public Entered By: Bonnell Public on 09/15/2022 10:16:37 -------------------------------------------------------------------------------- Wound Assessment Details Patient Name: Date of Service: Raymond Blood D. 09/15/2022 9:45 A M Medical Record Number: [id number] Patient Account Number: [id number] Date of Birth/Sex: Treating RN: 02/01/1966 (57 y.o. 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 Wound Status: Open Wounding Event: Surgical Injury Comorbid Congestive Heart Failure, Hypertension, Type II Diabetes, Date Acquired: 06/09/2022 History: Neuropathy Weeks Of Treatment: 9 Clustered Wound: No Photos Wound Measurements Length: (cm) 0.3 Width: (cm) 0.1 Depth: (cm) 0.1 Area: (cm) 0.024 Volume: (cm) 0.002 % Reduction in Area: 98.5% % Reduction in Volume: 100% Epithelialization: Small (1-33%) Wound Description Classification: Full Thickness Without Exposed Support Structures Wound Margin: Distinct, outline attached Exudate Amount: None Present Raymond [name], Raymond [name] ([id number]) Wound Bed Granulation Amount: None Present (0%) Necrotic Amount: None Present (0%) Foul Odor After Cleansing: No Slough/Fibrino No [id number] Page 8 of 9 Exposed Structure Fascia Exposed: No Fat Layer (Subcutaneous Tissue) Exposed: No Tendon Exposed: No Muscle Exposed: No Joint Exposed:  No Bone Exposed: No Limited to Skin Breakdown Treatment Notes Wound #4 (Amputation Site - Toe) Wound Laterality: Right Cleanser Soap and Water Discharge Instruction: Gently cleanse wound with antibacterial soap, rinse and pat dry prior to dressing wounds Peri-Wound Care Topical Primary Dressing Xeroform-HBD 2x2 (in/in) Discharge Instruction: Apply Xeroform-HBD 2x2 (in/in) as directed Secondary Dressing Gauze Discharge Instruction: As directed: dry, moistened with saline or moistened with Dakins Solution Secured With Medipore T - 42M Medipore H Soft Cloth Surgical T ape ape, 2x2 (in/yd) Kerlix Roll Sterile or Non-Sterile 6-ply 4.5x4 (yd/yd) Discharge Instruction: Apply Kerlix as directed Compression Wrap Compression Stockings Add-Ons Electronic Signature(s) Signed: 09/16/2022 1:41:07 PM By: Bonnell Public Entered By: Bonnell Public on 09/15/2022 10:08:09 -------------------------------------------------------------------------------- Vitals Details Patient Name: Date of Service: Raymond [name], Raymond Niemann D. 09/15/2022 9:45 A M Medical Record Number: [id number] Patient Account Number: [id number] Date of Birth/Sex: Treating RN: [birth year] (57 y.o. [name], [name] Primary Care Kenia Teagarden: Martie Round Other Clinician: Referring Bascom Biel: Treating Gavinn Collard/Extender: Gwendlyn Deutscher in Treatment: 9 Vital Signs Time Taken: 09:50 Temperature (F): 98.1 Height (in): 74 Pulse (bpm): 89 Weight (lbs): 398 Respiratory Rate (breaths/min): 18 Body Mass Index (BMI): 51.1 Blood Pressure (mmHg): 154/74 Reference Range: 80 - 120 mg / dl Raymond [name], Raymond [name] ([id number]) [id number] Page 9 of 9 Electronic Signature(s) Signed: 09/16/2022 1:41:07 PM By: Bonnell Public Entered By: Bonnell Public on 09/15/2022 09:50:36

## 2022-09-22 ENCOUNTER — Ambulatory Visit: Payer: Medicare HMO | Admitting: Physician Assistant

## 2022-09-29 ENCOUNTER — Encounter: Payer: Medicare HMO | Admitting: Internal Medicine

## 2022-09-29 DIAGNOSIS — T8131XA Disruption of external operation (surgical) wound, not elsewhere classified, initial encounter: Secondary | ICD-10-CM | POA: Diagnosis not present

## 2022-09-30 NOTE — Progress Notes (Signed)
JEVEN, MINERD ([id number]) [id number] Page 1 of 9 Visit Report for 09/29/2022 Arrival Information Details Patient Name: Date of Service: Raymond [name] 09/29/2022 9:45 A M Medical Record Number: [id number] Patient Account Number: [id number] Date of Birth/Sex: Treating RN: March 30, 1965 (57 y.o. Raymond [name] Primary Care Makhiya Coburn: Martie Round Other Clinician: Referring Quinto Tippy: Treating Lorne Winkels/Extender: Chauncey Mann, MICHA EL Loleta Books in Treatment: 11 Visit Information History Since Last Visit Added or deleted any medications: No Patient Arrived: Ambulatory Any new allergies or adverse reactions: No Arrival Time: 09:44 Had a fall or experienced change in No Accompanied By: self activities of daily living that may affect Transfer Assistance: None risk of falls: Patient Identification Verified: Yes Hospitalized since last visit: No Secondary Verification Process Completed: Yes Has Dressing in Place as Prescribed: Yes Patient Requires Transmission-Based Precautions: No Has Compression in Place as Prescribed: Yes Patient Has Alerts: Yes Has Footwear/Offloading in Place as Yes Patient Alerts: ABI R 1.37 L1.26 Prescribed: Left: Surgical Shoe with Pressure Relief Insole Right: Surgical Shoe with Pressure Relief Insole Pain Present Now: No Electronic Signature(s) Signed: 09/29/2022 6:06:43 PM By: Angelina Pih Entered By: Angelina Pih on 09/29/2022 06:47:49 -------------------------------------------------------------------------------- Clinic Level of Care Assessment Details Patient Name: Date of Service: Raymond [name] 09/29/2022 9:45 A M Medical Record Number: [id number] Patient Account Number: [id number] Date of Birth/Sex: Treating RN: 11/22/1965 (57 y.o. Raymond [name] Primary Care Glover Capano: Martie Round Other Clinician: Referring Miri Jose: Treating Yui Mulvaney/Extender: RO BSO N, MICHA EL  Loleta Books in Treatment: 11 Clinic Level of Care Assessment Items TOOL 4 Quantity Score []  - 0 Use when only an EandM is performed on FOLLOW-UP visit ASSESSMENTS - Nursing Assessment / Reassessment X- 1 10 Reassessment of Co-morbidities (includes updates in patient status) X- 1 5 Reassessment of Adherence to Treatment Plan Raymond [name], Raymond [name] ([id number]) [id number] Page 2 of 9 ASSESSMENTS - Wound and Skin A ssessment / Reassessment X - Simple Wound Assessment / Reassessment - one wound 1 5 []  - 0 Complex Wound Assessment / Reassessment - multiple wounds []  - 0 Dermatologic / Skin Assessment (not related to wound area) ASSESSMENTS - Focused Assessment X- 1 5 Circumferential Edema Measurements - multi extremities []  - 0 Nutritional Assessment / Counseling / Intervention []  - 0 Lower Extremity Assessment (monofilament, tuning fork, pulses) []  - 0 Peripheral Arterial Disease Assessment (using hand held doppler) ASSESSMENTS - Ostomy and/or Continence Assessment and Care []  - 0 Incontinence Assessment and Management []  - 0 Ostomy Care Assessment and Management (repouching, etc.) PROCESS - Coordination of Care X - Simple Patient / Family Education for ongoing care 1 15 []  - 0 Complex (extensive) Patient / Family Education for ongoing care X- 1 10 Staff obtains Chiropractor, Records, T Results / Process Orders est []  - 0 Staff telephones HHA, Nursing Homes / Clarify orders / etc []  - 0 Routine Transfer to another Facility (non-emergent condition) []  - 0 Routine Hospital Admission (non-emergent condition) []  - 0 New Admissions / Manufacturing engineer / Ordering NPWT Apligraf, etc. , []  - 0 Emergency Hospital Admission (emergent condition) X- 1 10 Simple Discharge Coordination []  - 0 Complex (extensive) Discharge Coordination PROCESS - Special Needs []  - 0 Pediatric / Minor Patient Management []  - 0 Isolation Patient  Management []  - 0 Hearing / Language / Visual special needs []  - 0 Assessment of Community assistance (transportation, D/C planning, etc.) []  - 0 Additional assistance / Altered mentation []  - 0 Support Surface(s)  Assessment (bed, cushion, seat, etc.) INTERVENTIONS - Wound Cleansing / Measurement X - Simple Wound Cleansing - one wound 1 5 []  - 0 Complex Wound Cleansing - multiple wounds X- 1 5 Wound Imaging (photographs - any number of wounds) []  - 0 Wound Tracing (instead of photographs) X- 1 5 Simple Wound Measurement - one wound []  - 0 Complex Wound Measurement - multiple wounds INTERVENTIONS - Wound Dressings X - Small Wound Dressing one or multiple wounds 1 10 []  - 0 Medium Wound Dressing one or multiple wounds []  - 0 Large Wound Dressing one or multiple wounds []  - 0 Application of Medications - topical []  - 0 Application of Medications - injection INTERVENTIONS - Miscellaneous []  - 0 External ear exam Raymond [name], Raymond [name] ([id number]) [phone].pdf Page 3 of 9 []  - 0 Specimen Collection (cultures, biopsies, blood, body fluids, etc.) []  - 0 Specimen(s) / Culture(s) sent or taken to Lab for analysis []  - 0 Patient Transfer (multiple staff / Michiel Sites Lift / Similar devices) []  - 0 Simple Staple / Suture removal (25 or less) []  - 0 Complex Staple / Suture removal (26 or more) []  - 0 Hypo / Hyperglycemic Management (close monitor of Blood Glucose) []  - 0 Ankle / Brachial Index (ABI) - do not check if billed separately X- 1 5 Vital Signs Has the patient been seen at the hospital within the last three years: Yes Total Score: 90 Level Of Care: New/Established - Level 3 Electronic Signature(s) Signed: 09/29/2022 6:06:43 PM By: Angelina Pih Entered By: Angelina Pih on 09/29/2022 14:39:52 -------------------------------------------------------------------------------- Encounter Discharge Information Details Patient Name: Date of  Service: Raymond Blood D. 09/29/2022 9:45 A M Medical Record Number: [id number] Patient Account Number: [id number] Date of Birth/Sex: Treating RN: 01-20-1966 (57 y.o. Raymond [name] Primary Care Jamaury Gumz: Martie Round Other Clinician: Referring Bellarae Lizer: Treating Maripaz Mullan/Extender: RO BSO Dorris Carnes, MICHA EL Loleta Books in Treatment: 11 Encounter Discharge Information Items Discharge Condition: Stable Ambulatory Status: Ambulatory Discharge Destination: Home Transportation: Private Auto Accompanied By: self Schedule Follow-up Appointment: Yes Clinical Summary of Care: Electronic Signature(s) Signed: 09/29/2022 5:40:53 PM By: Angelina Pih Entered By: Angelina Pih on 09/29/2022 14:40:53 Lower Extremity Assessment Details -------------------------------------------------------------------------------- Raymond [name] ([id number]) [id number] Page 4 of 9 Patient Name: Date of Service: Raymond [name] 09/29/2022 9:45 A M Medical Record Number: [id number] Patient Account Number: [id number] Date of Birth/Sex: Treating RN: 1965/04/20 (57 y.o. Raymond [name] Primary Care Alcie Runions: Martie Round Other Clinician: Referring Rakiya Krawczyk: Treating Leobardo Granlund/Extender: RO BSO N, MICHA EL Loleta Books in Treatment: 11 Edema Assessment Left: Right: Assessed: No No Edema: Yes Calf Left: Right: Point of Measurement: 42 cm From Medial Instep 49.6 cm Ankle Left: Right: Point of Measurement: 13 cm From Medial Instep 32.8 cm Vascular Assessment Left: Right: Pulses: Dorsalis Pedis Palpable: Yes Posterior Tibial Palpable: Yes Extremity colors, hair growth, and conditions: Extremity Color: Normal Hair Growth on Extremity: No Temperature of Extremity: Warm Capillary Refill: < 3 seconds Toe Nail Assessment Left: Right: Thick: Yes Discolored: Yes Deformed: No Improper Length and Hygiene: No Electronic  Signature(s) Signed: 09/29/2022 6:06:43 PM By: Angelina Pih Entered By: Angelina Pih on 09/29/2022 06:57:38 -------------------------------------------------------------------------------- Multi Wound Chart Details Patient Name: Date of Service: Raymond Blood D. 09/29/2022 9:45 A M Medical Record Number: [id number] Patient Account Number: [id number] Date of Birth/Sex: Treating RN: March 28, 1965 (57 y.o. Raymond [name] Primary Care Chenika Nevils: Martie Round Other Clinician: Referring Nela Bascom: Treating Nettie Cromwell/Extender: RO BSO N, MICHA EL  Rudy Jew Weeks in Treatment: 11 Vital Signs Height(in): 74 Pulse(bpm): 84 Weight(lbs): 398 Blood Pressure(mmHg): 149/75 Body Mass Index(BMI): 51.1 Temperature(F): 98.2 Respiratory Rate(breaths/min): [address] ([id number]) [id number] Page 5 of 9 [4:Photos:] [N/A:N/A] Right Amputation Site - T oe N/A N/A Wound Location: Surgical Injury N/A N/A Wounding Event: Open Surgical Wound N/A N/A Primary Etiology: Congestive Heart Failure, N/A N/A Comorbid History: Hypertension, Type II Diabetes, Neuropathy 06/09/2022 N/A N/A Date Acquired: 11 N/A N/A Weeks of Treatment: Open N/A N/A Wound Status: No N/A N/A Wound Recurrence: 0.4x0.1x0.1 N/A N/A Measurements L x W x D (cm) 0.031 N/A N/A A (cm) : rea 0.003 N/A N/A Volume (cm) : 98.10% N/A N/A % Reduction in Area: 99.90% N/A N/A % Reduction in Volume: Full Thickness Without Exposed N/A N/A Classification: Support Structures Medium N/A N/A Exudate Amount: Serosanguineous N/A N/A Exudate Type: red, brown N/A N/A Exudate Color: Distinct, outline attached N/A N/A Wound Margin: Large (67-100%) N/A N/A Granulation Amount: Red N/A N/A Granulation Quality: Small (1-33%) N/A N/A Necrotic Amount: Fat Layer (Subcutaneous Tissue): Yes N/A N/A Exposed Structures: Fascia: No Tendon: No Muscle: No Joint: No Bone: No Small  (1-33%) N/A N/A Epithelialization: Treatment Notes Electronic Signature(s) Signed: 09/29/2022 5:38:47 PM By: Angelina Pih Entered By: Angelina Pih on 09/29/2022 14:38:47 -------------------------------------------------------------------------------- Multi-Disciplinary Care Plan Details Patient Name: Date of Service: Raymond Blood D. 09/29/2022 9:45 A M Medical Record Number: [id number] Patient Account Number: [id number] Date of Birth/Sex: Treating RN: 05-04-1965 (57 y.o. Raymond [name] Primary Care Fumiko Cham: Martie Round Other Clinician: Referring Zorina Mallin: Treating Kadi Hession/Extender: Chauncey Mann, MICHA EL Loleta Books in Treatment: [address] Raymond [name], Raymond [name] ([id number]) [id number] Page 6 of 9 Nursing Diagnoses: Impaired tissue integrity Knowledge deficit related to ulceration/compromised skin integrity Goals: Patient/caregiver will verbalize understanding of skin care regimen Date Initiated: 07/08/2022 Date Inactivated: 08/01/2022 Target Resolution Date: 08/06/2022 Goal Status: Met Ulcer/skin breakdown will have a volume reduction of 30% by week 4 Date Initiated: 07/08/2022 Date Inactivated: 08/01/2022 Target Resolution Date: 08/06/2022 Goal Status: Met Ulcer/skin breakdown will have a volume reduction of 50% by week 8 Date Initiated: 07/08/2022 Date Inactivated: 08/08/2022 Target Resolution Date: 09/07/2022 Goal Status: Met Ulcer/skin breakdown will have a volume reduction of 80% by week 12 Date Initiated: 07/08/2022 Target Resolution Date: 10/08/2022 Goal Status: Active Ulcer/skin breakdown will heal within 14 weeks Date Initiated: 07/08/2022 Target Resolution Date: 10/22/2022 Goal Status: Active Interventions: Assess patient/caregiver ability to obtain necessary supplies Assess patient/caregiver ability to perform ulcer/skin care regimen upon admission and as needed Assess ulceration(s) every  visit Provide education on ulcer and skin care Screen for HBO Treatment Activities: Referred to DME Mikolaj Woolstenhulme for dressing supplies : 07/08/2022 Skin care regimen initiated : 07/08/2022 Topical wound management initiated : 07/08/2022 Notes: Electronic Signature(s) Signed: 09/29/2022 5:40:09 PM By: Angelina Pih Entered By: Angelina Pih on 09/29/2022 14:40:09 -------------------------------------------------------------------------------- Pain Assessment Details Patient Name: Date of Service: Raymond Blood D. 09/29/2022 9:45 A M Medical Record Number: [id number] Patient Account Number: [id number] Date of Birth/Sex: Treating RN: 1965-09-15 (57 y.o. Raymond [name] Primary Care Freedom Peddy: Martie Round Other Clinician: Referring Emerald Gehres: Treating Chrisandra Wiemers/Extender: RO BSO N, MICHA EL Loleta Books in Treatment: 11 Active Problems Location of Pain Severity and Description of Pain Patient Has Paino No Site Locations Rate the pain. Raymond [name], Raymond [name] ([id number]) [id number] Page 7 of 9 Rate the pain. Current Pain Level: 0 Pain Management and Medication Current Pain Management: Electronic Signature(s)  Signed: 09/29/2022 6:06:43 PM By: Angelina Pih Entered By: Angelina Pih on 09/29/2022 06:50:20 -------------------------------------------------------------------------------- Patient/Caregiver Education Details Patient Name: Date of Service: Raymond [name] 8/29/2024andnbsp9:45 A M Medical Record Number: [id number] Patient Account Number: [id number] Date of Birth/Gender: Treating RN: 1966-01-07 (57 y.o. Raymond [name] Primary Care Physician: Martie Round Other Clinician: Referring Physician: Treating Physician/Extender: RO BSO Dorris Carnes, MICHA EL Loleta Books in Treatment: 11 Education Assessment Education Provided To: Patient Education Topics Provided Wound/Skin Impairment: Handouts: Caring for Your  Ulcer Methods: Explain/Verbal Responses: State content correctly Electronic Signature(s) Signed: 09/29/2022 6:06:43 PM By: Angelina Pih Entered By: Angelina Pih on 09/29/2022 14:40:19 Raymond [name] ([id number]) [id number] Page 8 of 9 -------------------------------------------------------------------------------- Wound Assessment Details Patient Name: Date of Service: Raymond [name] 09/29/2022 9:45 A M Medical Record Number: [id number] Patient Account Number: [id number] Date of Birth/Sex: Treating RN: [birth year] (57 y.o. 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 Wound Status: Open Wounding Event: Surgical Injury Comorbid Congestive Heart Failure, Hypertension, Type II Diabetes, Date Acquired: 06/09/2022 History: Neuropathy Weeks Of Treatment: 11 Clustered Wound: No Photos Wound Measurements Length: (cm) 0.4 Width: (cm) 0.1 Depth: (cm) 0.1 Area: (cm) 0.031 Volume: (cm) 0.003 % Reduction in Area: 98.1% % Reduction in Volume: 99.9% Epithelialization: Small (1-33%) Tunneling: No Undermining: No Wound Description Classification: Full Thickness Without Exposed Support Structures Wound Margin: Distinct, outline attached Exudate Amount: Medium Exudate Type: Serosanguineous Exudate Color: red, brown Foul Odor After Cleansing: No Slough/Fibrino Yes Wound Bed Granulation Amount: Large (67-100%) Exposed Structure Granulation Quality: Red Fascia Exposed: No Necrotic Amount: Small (1-33%) Fat Layer (Subcutaneous Tissue) Exposed: Yes Tendon Exposed: No Muscle Exposed: No Joint Exposed: No Bone Exposed: No Treatment Notes Wound #4 (Amputation Site - Toe) Wound Laterality:  Right Cleanser Soap and Water Discharge Instruction: Gently cleanse wound with antibacterial soap, rinse and pat dry prior to dressing wounds Raymond [name] ([id number]) [id number] Page 9 of 9 Peri-Wound Care Topical Primary Dressing Xeroform-HBD 2x2 (in/in) Discharge Instruction: Apply Xeroform-HBD 2x2 (in/in) as directed Secondary Dressing Gauze Discharge Instruction: As directed: dry, moistened with saline or moistened with Dakins Solution Secured With Medipore T - 68M Medipore H Soft Cloth Surgical T ape ape, 2x2 (in/yd) Kerlix Roll Sterile or Non-Sterile 6-ply 4.5x4 (yd/yd) Discharge Instruction: Apply Kerlix as directed Compression Wrap Compression Stockings Add-Ons Electronic Signature(s) Signed: 09/29/2022 6:06:43 PM By: Angelina Pih Entered By: Angelina Pih on 09/29/2022 06:57:08 -------------------------------------------------------------------------------- Vitals Details Patient Name: Date of Service: Raymond [name], Raymond Niemann D. 09/29/2022 9:45 A M Medical Record Number: [id number] Patient Account Number: [id number] Date of Birth/Sex: Treating RN: February 25, 1965 (57 y.o. Raymond [name] Primary Care Quetzalli Clos: Martie Round Other Clinician: Referring Priti Consoli: Treating Katherin Ramey/Extender: RO BSO N, MICHA EL Loleta Books in Treatment: 11 Vital Signs Time Taken: 09:47 Temperature (F): 98.2 Height (in): 74 Pulse (bpm): 84 Weight (lbs): 398 Respiratory Rate (breaths/min): 20 Body Mass Index (BMI): 51.1 Blood Pressure (mmHg): 149/75 Reference Range: 80 - 120 mg / dl Electronic Signature(s) Signed: 09/29/2022 6:06:43 PM By: Angelina Pih Entered By: Angelina Pih on 09/29/2022 06:50:13

## 2022-09-30 NOTE — Progress Notes (Signed)
[name], Raymond ([id number]) [id number] Page 1 of 8 Visit Report for 09/29/2022 HPI Details Patient Name: Date of Service: Raymond [name] 09/29/2022 9:45 A M Medical Record Number: [id number] Patient Account Number: [id number] Date of Birth/Sex: Treating RN: 04/06/1965 (57 y.o. Raymond [name] Primary Care Provider: Martie Round Other Clinician: Referring Provider: Treating Provider/Extender: RO BSO N, MICHA EL Loleta Books in Treatment: 11 History of Present Illness HPI Description: 09/05/17-He is seeing an initial evaluation for a left plantar foot ulcer. He has a remote history of left great toe amputation. He states that 4-6 weeks ago he noted callus formation and ulceration. He has not seen primary care regarding this. He is not currently on antibiotic therapy. He does not routinely follow with podiatry. He states diabetic foot wear will arrive early next week. The EHR shows an A1c of 9% approximate 4 months ago but he states he had one and primary care a few weeks ago but does not know the results. He is neuropathic and does not complain of any pain, he is currently wearing crocs. 09/12/17-he is here in follow up evaluation for left plantar foot ulcer. There is improvement in both appearance and measurement. We will continue with same treatment plan and he will follow up next week. 09/19/17 on evaluation today patient actually appears to be doing excellent in regard to his ulcer on the invitation site of his left great foot plantar aspect. He's been tolerating the dressing changes without complication. In fact with the Prisma and the current measures he has been shown signs of excellent improvement week by week up to this point. We have been to breeding the wound in this seems to have been of great benefit for him. Fortunately there is no evidence of infection. 09/26/17 on evaluation today patient appears to be doing rather well in  regard to his wound. He did not know quite as much improvement this week as compared to last week. Nonetheless he still continues to show signs of improving to some degree. I do believe he may benefit from an offloading shoe. No fevers, chills, nausea, or vomiting noted at this time. 10/03/17 on evaluation today patient actually appears to be doing much better in regard to the amputation site plantar foot ulcer. Overall this appears significantly smaller even compared to previous. He's been tolerating the dressing changes without complication. He did get his diabetic shoes and I did have a look at them they appear to be fairly good. Nonetheless I do think that for the time being I would probably recommend he continue with the offloading shoe that we have been utilizing just due to the fact that with the dressing I don't know that his diabetic shoes are going to work as appropriately as far as not called an additional pressure and irritation to the area in question. He understands. 10/10/17 on evaluation today patient actually appears to be doing very well in regard to his plantar foot ulcer. He has been tolerating the dressing changes without complication. I do feel like he's making signs of good improvement and in fact of the wound bed appears to be better although it may not be significantly changed in size it appears healthier and I do believe is showing signs of improving. Nonetheless we gonna keep working towards healing as far as that is concerned. There is no evidence of infection. 10/17/17 on evaluation today patient actually appears to be doing poorly in regard to his plantar foot ulcer. Unfortunately other  than just the area where the wound is itself there appears to be a blister that communicates with the wound unfortunately. This is more lateral to the wound itself and also to the distal point of the amputation site. There does not appear to be any evidence of cellulitis spreading at the foot  but I do believe some of the drainage from the site itself is. When in nature. Nonetheless this blister area I think needs to be removed in order to allow for appropriate healing of the ulcer itself I think that is gonna be difficult for it to heal otherwise. This was discussed with the patient today. 10/24/17 on evaluation today patient actually appears to be doing better compared to last week even post debridement. He has shown signs of improvement he did test positive for Staphylococcus aureus. With that being said he notes that he's not have any discomfort and in general he does feel like things seem to been doing better. Fortunately there's no additional blistering and no evidence of remaining infection at this time. No fevers, chills, nausea, or vomiting noted at this time. He has three days of antibiotic left. 10/31/17 on evaluation today patient actually appears to be doing very well in regard to his ulcer on the left foot. He has been tolerating the dressing changes without complication. With that being said fortunately there appears to be no evidence of infection I do think he's made progress compared to previous. No fevers, chills, nausea, or vomiting noted at this time. 11/14/17 on evaluation today patient actually appears to be doing excellent in regard to his amputation site ulcer on his foot. In fact this appears to be completely healed at this point. There is no evidence of infection nor underline abscess and I did thoroughly evaluate the periwound location. Overall I'm very pleased with how things appear. Readmission: 01-03-2022 upon evaluation today patient appears for reevaluation here in the clinic although it has been since 2019 that I last saw him. This again has been over 4 years. With that being said he is having an issue with a wound on his foot unfortunately. He has not had any recent x-rays he tells me at this point which is unfortunate as avoid definitely can need to get  something started that can be one of the primary items to get moving forward with. With that being said I also think were probably can obtain a wound culture he is probably going to require some antibiotics at some point. I just want to make sure that we have him on the right thing when we do this. Currently the patient tells me that his wounds have been present in regard to the met head for about a month at this point. With that being said he also has a wound on the third toe which is the next toe this actually still present. This unfortunately has a lot of callus buildup around it but I think it is larger underneath than what it would appear on initial inspection. Patient does have a history of diabetes mellitus type 2, congestive heart failure, and COPD. YAMIR, PAEZ ([id number]) [id number] Page 2 of 8 01-11-2022 upon evaluation patient's wounds actually are showing signs of doing about the same may be just slightly cleaner but definitely not where we want things to be as of yet. Fortunately there does not appear to be any signs of active infection locally nor systemically at this point which is great news. No fevers, chills, nausea, vomiting, or diarrhea.  Of note patient has had his MRI but we do not have the results of that as of yet we are still waiting for the reading on this at this point. 01-18-2022 upon evaluation today patient presents after having had his MRI finally complete. 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 The third and fourth digits seem to be early which is good news. Nonetheless we are still going to need to try to see about getting the infection under control he is already been on the antibiotic therapy which I think has done quite well. In fact I feel like he is showing signs of improvement already. 12/28; patient with a wound on the medial aspect of the left first  metatarsal head and an area on the left third toe with slight hammer deformity. He has had a previous left first toe amputation. We are using Iodoflex on the wound. He is a diabetic a recent MRI showed osteomyelitis we have been giving him Cipro and Doxy which he appears to be tolerating well. 02-10-2022 upon evaluation today patient unfortunately has issues still with open wounds of his foot on the left. He does have known osteomyelitis at this point and that is something that we have been treating with oral antibiotics. I actually have not seen him personally since 19 December. In that time he has not had any debridement from that point until today based on what I can see as best I can tell anyway. Has been on Cipro as well as doxycycline. Nonetheless he does still seem to have open wounds today and I think that he would benefit from hyperbaric oxygen therapy. I discussed that with him today as well. 02-18-2022 upon evaluation today patient appears to be doing about the same in regard to his wounds. Fortunately I do not see any signs of infection although he still has areas that seem to initially close down but that he has a lot of callus that still open at both locations most on the metatarsal region as well as the toe. I think that he is appropriate for proceeding with the hyperbarics and I think the sooner we get this done to try to get these wounds healed the better off he will be. 02-25-2022 upon evaluation today patient's wounds do show signs of being dry get again. We were attempting to pack and some of the alginate dressing instead of doing the collagen as everything was getting very dry but nonetheless this is still continue to be an ongoing issue. My suggestion based on what I am seeing is good to be that we actually switch to doing Xeroform which should hopefully keep this a little bit more moist and from hopefully closing up prematurely. The patient voiced understanding. 03-03-2022 upon  evaluation today patient appears to be doing well with regard to his wounds things as before appear to be getting dry. We are using Xeroform to try to keep it open is much as possible but each time I see him he does tend to cover over the good news is each time he also seems to be getting a little bit smaller which is excellent. Fortunately I do not see any evidence of active infection locally nor systemically which is great news. I think this means that between the antibiotics and the hyperbarics were really on a good track here. 03-10-2022 upon evaluation today patient appears to be doing well currently in regard to his wounds. He  is actually showing signs of some good improvement he is also tolerating HBO therapy very well. I do not see any evidence of active infection locally nor systemically which is great news and overall I am extremely pleased in that regard. No fevers, chills, nausea, vomiting, or diarrhea. 03-17-2022 upon evaluation today patient appears to be doing well currently in regard to his wounds in fact the toe is completely healed. The metatarsal head location though not completely healed appears to be doing much better which is great news and overall I am extremely pleased with where we stand today. 03-24-2022 upon evaluation today patient appears to be doing well currently in regard to his wounds. He has been tolerating the dressing changes without complication. Fortunately there does not appear to be any signs of active infection locally nor systemically in fact I am not so sure this wound is very close to being healed. I did perform some debridement to clearway some of the callus and the patient tolerated this today without complication. Postdebridement the wound bed is significantly improved. 03-31-2022 upon evaluation today patient appears to be doing well currently in regard to his wound which is actually showing signs of being completely healed. This is great news. Fortunately I  do believe that the patient is doing much better he still on the oral antibiotics which I think is helping to treat the osteomyelitis is also undergoing hyperbaric oxygen therapy which I think is doing a really good job here as well. 04-21-2022 upon evaluation today patient continues to remain completely closed. He seems to be doing excellent and is very close to completion of his 40 treatments of HBO therapy which that is the point where recommend discontinue therapy at that point. Fortunately I do not see any evidence of active infection locally nor systemically at this time which is great news. Readmission: 07-08-2022 unfortunately Mr. Rosete since I last saw him ended up having an issue with osteomyelitis of his great toe right side which included having to have amputation. This amputation was performed actually on 06-08-2022 and was again amputated secondary to the osteomyelitis. With that being said following however this has dehisced and therefore the patient was referred to [place] for further evaluation and treatment of the dehisced surgical wound at the amputation site of the right great toe. His past medical history really has not changed significantly since I last saw him in the first of the year we will be doing hyperbarics for the left foot we are able to get all this healed that still is doing well according to what the patient tells me at this point. 07-15-2022 upon evaluation today patient appears to be doing well currently in regard to his wound. This in fact is showing signs of good improvement in my opinion. Fortunately I do not see any evidence of active infection locally nor systemically which is great news. No fevers, chills, nausea, vomiting, or diarrhea. 07-25-2022 upon evaluation today patient appears to be doing well currently in regard to his foot ulcer is actually measuring smaller and looking better I am actually pleased in that regard. We did get the results back from his  pathology that showed necrotic bone but nothing that appears to be actually open at this point. Fortunately I do not see any signs of active infection at this time. 08-01-22 upon evaluation today patient appears to be doing well currently in regard to his wound which is showing signs of improvement I think we may want to switch over to  collagen however. 08-08-2022 upon evaluation patient's wound is actually showing signs of significant improvement. Fortunately I do not see any signs of active infection at this time which is great news and in general I do believe that we are making headway towards complete closure which is great news as well. 08-15-2022 upon evaluation today patient is making excellent progress towards closure. This wound looks to be doing excellent. Is not nearly as deep as what it was and I think that he is making great progress towards where we want to be. I am very pleased at this time. 7/23; this is a right second toe amputation site. We have been using Promogran change every 2 nd day 09-01-2022 upon evaluation today patient appears to be doing excellent in regard to his wound on the foot. He has been tolerating the dressing changes without complication and in general seems to be making really good progress towards complete closure. I do not see any signs of infection which is great news. No fevers, chills, nausea, vomiting, or diarrhea. 09-08-2022 upon evaluation today patient appears to be doing well currently in regard to his foot ulcer this is getting very close to complete resolution were not 100% there but again I am extremely pleased with where things stand currently. I do not see any evidence of active infection locally or systemically which is great news. No fevers, chills, nausea, vomiting, or diarrhea. RODOLPHE, RETTIG ([id number]) [id number] Page 3 of 8 09-15-2022 upon evaluation today patient appears to be doing excellent in regard to his  wound. Has been tolerating the dressing changes without complication. Fortunately there does not appear to be any signs of active infection at this time. Fortunately I do not see any evidence of worsening overall and I believe that the patient is making good headway towards complete closure 8/29; patient's wound is at the amputation site of her first and second toe. Very tiny open area remains. We have been using Xeroform. He is changing this daily. Electronic Signature(s) Signed: 09/29/2022 5:20:34 PM By: Baltazar Najjar MD Entered By: Baltazar Najjar on 09/29/2022 07:37:40 -------------------------------------------------------------------------------- Physician Orders Details Patient Name: Date of Service: [name] 09/29/2022 9:45 A M Medical Record Number: [id number] Patient Account Number: [id number] Date of Birth/Sex: Treating RN: 1965-06-11 (57 y.o. Raymond [name] Primary Care Provider: Martie Round Other Clinician: Referring Provider: Treating Provider/Extender: RO BSO N, MICHA EL Loleta Books in Treatment: 11 Verbal / Phone Orders: No Diagnosis Coding Follow-up Appointments Return Appointment in 1 week. Bathing/ Shower/ Hygiene May shower; gently cleanse wound with antibacterial soap, rinse and pat dry prior to dressing wounds Off-Loading Open toe surgical shoe Wound Treatment Wound #4 - Amputation Site - Toe Wound Laterality: Right Cleanser: Soap and Water 1 x Per Day/30 Days Discharge Instructions: Gently cleanse wound with antibacterial soap, rinse and pat dry prior to dressing wounds Prim Dressing: Xeroform-HBD 2x2 (in/in) 1 x Per Day/30 Days ary Discharge Instructions: Apply Xeroform-HBD 2x2 (in/in) as directed Secondary Dressing: Gauze (Generic) 1 x Per Day/30 Days Discharge Instructions: As directed: dry, moistened with saline or moistened with Dakins Solution Secured With: Medipore T - 58M Medipore H Soft Cloth Surgical T ape ape,  2x2 (in/yd) (Generic) 1 x Per Day/30 Days Secured With: Kerlix Roll Sterile or Non-Sterile 6-ply 4.5x4 (yd/yd) (Generic) 1 x Per Day/30 Days Discharge Instructions: Apply Kerlix as directed Electronic Signature(s) Signed: 09/29/2022 6:06:43 PM By: Angelina Pih Signed: 09/30/2022 7:45:25 AM By: Baltazar Najjar MD Previous Signature: 09/29/2022 5:20:34  PM Version By: Baltazar Najjar MD Entered By: Angelina Pih on 09/29/2022 14:39:46 Lucia Gaskins ([id number]) [id number] Page 4 of 8 -------------------------------------------------------------------------------- Problem List Details Patient Name: Date of Service: Raymond [name] 09/29/2022 9:45 A M Medical Record Number: [id number] Patient Account Number: [id number] Date of Birth/Sex: Treating RN: May 05, 1965 (57 y.o. Raymond [name] Primary Care Provider: Martie Round Other Clinician: Referring Provider: Treating Provider/Extender: RO BSO N, MICHA EL Loleta Books in Treatment: 11 Active Problems ICD-10 Encounter Code Description Active Date MDM Diagnosis T81.31XA Disruption of external operation (surgical) wound, not elsewhere classified, 07/08/2022 No Yes initial encounter L97.512 Non-pressure chronic ulcer of other part of right foot with fat layer exposed 07/08/2022 No Yes E11.621 Type 2 diabetes mellitus with foot ulcer 07/08/2022 No Yes I50.42 Chronic combined systolic (congestive) and diastolic (congestive) heart failure 07/08/2022 No Yes J44.9 Chronic obstructive pulmonary disease, unspecified 07/08/2022 No Yes Inactive Problems Resolved Problems Electronic Signature(s) Signed: 09/29/2022 5:20:34 PM By: Baltazar Najjar MD Entered By: Baltazar Najjar on 09/29/2022 07:36:40 -------------------------------------------------------------------------------- Progress Note Details Patient Name: Date of Service: [name] 09/29/2022 9:45 A M Medical Record Number:  [id number] Patient Account Number: [id number] Date of Birth/Sex: Treating RN: 09/22/1965 (57 y.o. Ilir, Guyot, La Minita D ([id number]) [id number] Page 5 of 8 Primary Care Provider: Martie Round Other Clinician: Referring Provider: Treating Provider/Extender: RO BSO N, MICHA EL Loleta Books in Treatment: 11 Subjective History of Present Illness (HPI) 09/05/17-He is seeing an initial evaluation for a left plantar foot ulcer. He has a remote history of left great toe amputation. He states that 4-6 weeks ago he noted callus formation and ulceration. He has not seen primary care regarding this. He is not currently on antibiotic therapy. He does not routinely follow with podiatry. He states diabetic foot wear will arrive early next week. The EHR shows an A1c of 9% approximate 4 months ago but he states he had one and primary care a few weeks ago but does not know the results. He is neuropathic and does not complain of any pain, he is currently wearing crocs. 09/12/17-he is here in follow up evaluation for left plantar foot ulcer. There is improvement in both appearance and measurement. We will continue with same treatment plan and he will follow up next week. 09/19/17 on evaluation today patient actually appears to be doing excellent in regard to his ulcer on the invitation site of his left great foot plantar aspect. He's been tolerating the dressing changes without complication. In fact with the Prisma and the current measures he has been shown signs of excellent improvement week by week up to this point. We have been to breeding the wound in this seems to have been of great benefit for him. Fortunately there is no evidence of infection. 09/26/17 on evaluation today patient appears to be doing rather well in regard to his wound. He did not know quite as much improvement this week as compared to last week. Nonetheless he still continues to show  signs of improving to some degree. I do believe he may benefit from an offloading shoe. No fevers, chills, nausea, or vomiting noted at this time. 10/03/17 on evaluation today patient actually appears to be doing much better in regard to the amputation site plantar foot ulcer. Overall this appears significantly smaller even compared to previous. He's been tolerating the dressing changes without complication. He did get his diabetic shoes and I did have a look at them they  appear to be fairly good. Nonetheless I do think that for the time being I would probably recommend he continue with the offloading shoe that we have been utilizing just due to the fact that with the dressing I don't know that his diabetic shoes are going to work as appropriately as far as not called an additional pressure and irritation to the area in question. He understands. 10/10/17 on evaluation today patient actually appears to be doing very well in regard to his plantar foot ulcer. He has been tolerating the dressing changes without complication. I do feel like he's making signs of good improvement and in fact of the wound bed appears to be better although it may not be significantly changed in size it appears healthier and I do believe is showing signs of improving. Nonetheless we gonna keep working towards healing as far as that is concerned. There is no evidence of infection. 10/17/17 on evaluation today patient actually appears to be doing poorly in regard to his plantar foot ulcer. Unfortunately other than just the area where the wound is itself there appears to be a blister that communicates with the wound unfortunately. This is more lateral to the wound itself and also to the distal point of the amputation site. There does not appear to be any evidence of cellulitis spreading at the foot but I do believe some of the drainage from the site itself is. When in nature. Nonetheless this blister area I think needs to be removed in  order to allow for appropriate healing of the ulcer itself I think that is gonna be difficult for it to heal otherwise. This was discussed with the patient today. 10/24/17 on evaluation today patient actually appears to be doing better compared to last week even post debridement. He has shown signs of improvement he did test positive for Staphylococcus aureus. With that being said he notes that he's not have any discomfort and in general he does feel like things seem to been doing better. Fortunately there's no additional blistering and no evidence of remaining infection at this time. No fevers, chills, nausea, or vomiting noted at this time. He has three days of antibiotic left. 10/31/17 on evaluation today patient actually appears to be doing very well in regard to his ulcer on the left foot. He has been tolerating the dressing changes without complication. With that being said fortunately there appears to be no evidence of infection I do think he's made progress compared to previous. No fevers, chills, nausea, or vomiting noted at this time. 11/14/17 on evaluation today patient actually appears to be doing excellent in regard to his amputation site ulcer on his foot. In fact this appears to be completely healed at this point. There is no evidence of infection nor underline abscess and I did thoroughly evaluate the periwound location. Overall I'm very pleased with how things appear. Readmission: 01-03-2022 upon evaluation today patient appears for reevaluation here in the clinic although it has been since 2019 that I last saw him. This again has been over 4 years. With that being said he is having an issue with a wound on his foot unfortunately. He has not had any recent x-rays he tells me at this point which is unfortunate as avoid definitely can need to get something started that can be one of the primary items to get moving forward with. With that being said I also think were probably can obtain a  wound culture he is probably going to require some  antibiotics at some point. I just want to make sure that we have him on the right thing when we do this. Currently the patient tells me that his wounds have been present in regard to the met head for about a month at this point. With that being said he also has a wound on the third toe which is the next toe this actually still present. This unfortunately has a lot of callus buildup around it but I think it is larger underneath than what it would appear on initial inspection. Patient does have a history of diabetes mellitus type 2, congestive heart failure, and COPD. 01-11-2022 upon evaluation patient's wounds actually are showing signs of doing about the same may be just slightly cleaner but definitely not where we want things to be as of yet. Fortunately there does not appear to be any signs of active infection locally nor systemically at this point which is great news. No fevers, chills, nausea, vomiting, or diarrhea. Of note patient has had his MRI but we do not have the results of that as of yet we are still waiting for the reading on this at this point. 01-18-2022 upon evaluation today patient presents after having had his MRI finally complete. 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 The third and fourth digits seem to be early which is good news. Nonetheless we are still going to need to try to see about getting the infection under control he is already been on the antibiotic therapy which I think has done quite well. In fact I feel like he is showing signs of improvement already. 12/28; patient with a wound on the medial aspect of the left first metatarsal head and an area on the left third toe with slight hammer deformity. He has had a previous left first toe amputation. We are using Iodoflex on the wound. He is a diabetic a recent MRI showed osteomyelitis we have been  giving him Cipro and Doxy which he appears to be tolerating well. 02-10-2022 upon evaluation today patient unfortunately has issues still with open wounds of his foot on the left. He does have known osteomyelitis at this point and that is something that we have been treating with oral antibiotics. I actually have not seen him personally since 19 December. In that time he has not had any debridement from that point until today based on what I can see as best I can tell anyway. Has been on Cipro as well as doxycycline. Nonetheless he does still seem to have open wounds today and I think that he would benefit from hyperbaric oxygen therapy. I discussed that with him today as well. 02-18-2022 upon evaluation today patient appears to be doing about the same in regard to his wounds. Fortunately I do not see any signs of infection although he still has areas that seem to initially close down but that he has a lot of callus that still open at both locations most on the metatarsal region as well as the toe. I think that he is appropriate for proceeding with the hyperbarics and I think the sooner we get this done to try to get these wounds healed the better off he will be. RORAN, RELF ([id number]) [id number] Page 6 of 8 02-25-2022 upon evaluation today patient's wounds do show signs of being dry get again. We were attempting to pack and some of the alginate dressing instead of doing the collagen  as everything was getting very dry but nonetheless this is still continue to be an ongoing issue. My suggestion based on what I am seeing is good to be that we actually switch to doing Xeroform which should hopefully keep this a little bit more moist and from hopefully closing up prematurely. The patient voiced understanding. 03-03-2022 upon evaluation today patient appears to be doing well with regard to his wounds things as before appear to be getting dry. We are using Xeroform  to try to keep it open is much as possible but each time I see him he does tend to cover over the good news is each time he also seems to be getting a little bit smaller which is excellent. Fortunately I do not see any evidence of active infection locally nor systemically which is great news. I think this means that between the antibiotics and the hyperbarics were really on a good track here. 03-10-2022 upon evaluation today patient appears to be doing well currently in regard to his wounds. He is actually showing signs of some good improvement he is also tolerating HBO therapy very well. I do not see any evidence of active infection locally nor systemically which is great news and overall I am extremely pleased in that regard. No fevers, chills, nausea, vomiting, or diarrhea. 03-17-2022 upon evaluation today patient appears to be doing well currently in regard to his wounds in fact the toe is completely healed. The metatarsal head location though not completely healed appears to be doing much better which is great news and overall I am extremely pleased with where we stand today. 03-24-2022 upon evaluation today patient appears to be doing well currently in regard to his wounds. He has been tolerating the dressing changes without complication. Fortunately there does not appear to be any signs of active infection locally nor systemically in fact I am not so sure this wound is very close to being healed. I did perform some debridement to clearway some of the callus and the patient tolerated this today without complication. Postdebridement the wound bed is significantly improved. 03-31-2022 upon evaluation today patient appears to be doing well currently in regard to his wound which is actually showing signs of being completely healed. This is great news. Fortunately I do believe that the patient is doing much better he still on the oral antibiotics which I think is helping to treat the osteomyelitis is also  undergoing hyperbaric oxygen therapy which I think is doing a really good job here as well. 04-21-2022 upon evaluation today patient continues to remain completely closed. He seems to be doing excellent and is very close to completion of his 40 treatments of HBO therapy which that is the point where recommend discontinue therapy at that point. Fortunately I do not see any evidence of active infection locally nor systemically at this time which is great news. Readmission: 07-08-2022 unfortunately Mr. Limbach since I last saw him ended up having an issue with osteomyelitis of his great toe right side which included having to have amputation. This amputation was performed actually on 06-08-2022 and was again amputated secondary to the osteomyelitis. With that being said following however this has dehisced and therefore the patient was referred to [place] for further evaluation and treatment of the dehisced surgical wound at the amputation site of the right great toe. His past medical history really has not changed significantly since I last saw him in the first of the year we will be doing hyperbarics for the  left foot we are able to get all this healed that still is doing well according to what the patient tells me at this point. 07-15-2022 upon evaluation today patient appears to be doing well currently in regard to his wound. This in fact is showing signs of good improvement in my opinion. Fortunately I do not see any evidence of active infection locally nor systemically which is great news. No fevers, chills, nausea, vomiting, or diarrhea. 07-25-2022 upon evaluation today patient appears to be doing well currently in regard to his foot ulcer is actually measuring smaller and looking better I am actually pleased in that regard. We did get the results back from his pathology that showed necrotic bone but nothing that appears to be actually open at this point. Fortunately I do not see any signs of active  infection at this time. 08-01-22 upon evaluation today patient appears to be doing well currently in regard to his wound which is showing signs of improvement I think we may want to switch over to collagen however. 08-08-2022 upon evaluation patient's wound is actually showing signs of significant improvement. Fortunately I do not see any signs of active infection at this time which is great news and in general I do believe that we are making headway towards complete closure which is great news as well. 08-15-2022 upon evaluation today patient is making excellent progress towards closure. This wound looks to be doing excellent. Is not nearly as deep as what it was and I think that he is making great progress towards where we want to be. I am very pleased at this time. 7/23; this is a right second toe amputation site. We have been using Promogran change every 2 nd day 09-01-2022 upon evaluation today patient appears to be doing excellent in regard to his wound on the foot. He has been tolerating the dressing changes without complication and in general seems to be making really good progress towards complete closure. I do not see any signs of infection which is great news. No fevers, chills, nausea, vomiting, or diarrhea. 09-08-2022 upon evaluation today patient appears to be doing well currently in regard to his foot ulcer this is getting very close to complete resolution were not 100% there but again I am extremely pleased with where things stand currently. I do not see any evidence of active infection locally or systemically which is great news. No fevers, chills, nausea, vomiting, or diarrhea. 09-15-2022 upon evaluation today patient appears to be doing excellent in regard to his wound. Has been tolerating the dressing changes without complication. Fortunately there does not appear to be any signs of active infection at this time. Fortunately I do not see any evidence of worsening overall and I believe that  the patient is making good headway towards complete closure 8/29; patient's wound is at the amputation site of her first and second toe. Very tiny open area remains. We have been using Xeroform. He is changing this daily. Objective Constitutional Vitals Time Taken: 9:47 AM, Height: 74 in, Weight: 398 lbs, BMI: 51.1, Temperature: 98.2 F, Pulse: 84 bpm, Respiratory Rate: 20 breaths/min, Blood Pressure: 149/75 mmHg. Integumentary (Hair, Skin) Wound #4 status is Open. Original cause of wound was Surgical Injury. The date acquired was: 06/09/2022. The wound has been in treatment 11 weeks. The NICHOLES, HUML ([id number]) [id number] Page 7 of 8 wound is located on the Right Amputation Site - T The wound measures 0.4cm length x 0.1cm width x 0.1cm depth; 0.031cm^2 area and  0.003cm^3 volume. oe. There is Fat Layer (Subcutaneous Tissue) exposed. There is no tunneling or undermining noted. There is a medium amount of serosanguineous drainage noted. The wound margin is distinct with the outline attached to the wound base. There is large (67-100%) red granulation within the wound bed. There is a small (1-33%) amount of necrotic tissue within the wound bed. Assessment Active Problems ICD-10 Disruption of external operation (surgical) wound, not elsewhere classified, initial encounter Non-pressure chronic ulcer of other part of right foot with fat layer exposed Type 2 diabetes mellitus with foot ulcer Chronic combined systolic (congestive) and diastolic (congestive) heart failure Chronic obstructive pulmonary disease, unspecified Plan Follow-up Appointments: Return Appointment in 1 week. Bathing/ Shower/ Hygiene: May shower; gently cleanse wound with antibacterial soap, rinse and pat dry prior to dressing wounds Off-Loading: Open toe surgical shoe WOUND #4: - Amputation Site - T oe Wound Laterality: Right Cleanser: Soap and Water 1 x Per Day/30 Days Discharge  Instructions: Gently cleanse wound with antibacterial soap, rinse and pat dry prior to dressing wounds Prim Dressing: Xeroform-HBD 2x2 (in/in) 1 x Per Day/30 Days ary Discharge Instructions: Apply Xeroform-HBD 2x2 (in/in) as directed Secondary Dressing: Gauze (Generic) 1 x Per Day/30 Days Discharge Instructions: As directed: dry, moistened with saline or moistened with Dakins Solution Secured With: Medipore T - 9M Medipore H Soft Cloth Surgical T ape ape, 2x2 (in/yd) (Generic) 1 x Per Day/30 Days Secured With: Kerlix Roll Sterile or Non-Sterile 6-ply 4.5x4 (yd/yd) (Generic) 1 x Per Day/30 Days Discharge Instructions: Apply Kerlix as directed 1. Very tiny open area remains. Had to put this under illumination to even see it 2. Dry thickened hyperkeratotic skin below the wound removed with a #3 curette there is no open wound here. 3. Just about closed here. Electronic Signature(s) Signed: 09/29/2022 5:20:34 PM By: Baltazar Najjar MD Entered By: Baltazar Najjar on 09/29/2022 07:38:50 -------------------------------------------------------------------------------- SuperBill Details Patient Name: Date of Service: [name], [name] 09/29/2022 Medical Record Number: [id number] Patient Account Number: [id number] Date of Birth/Sex: Treating RN: [birth year] (57 y.o. Raymond [name] Primary Care Provider: Martie Round Other Clinician: Referring Provider: Treating Provider/Extender: RO BSO Dorris Carnes, MICHA EL Loleta Books in Treatment: [address] Diagnosis Coding ICD-10 Codes INIKO, DEROSSETT ([id number]) [phone] Page 8 of 8 Code Description T81.31XA Disruption of external operation (surgical) wound, not elsewhere classified, initial encounter L97.512 Non-pressure chronic ulcer of other part of right foot with fat layer exposed E11.621 Type 2 diabetes mellitus with foot ulcer I50.42 Chronic combined systolic (congestive) and diastolic (congestive) heart  failure J44.9 Chronic obstructive pulmonary disease, unspecified Facility Procedures : CPT4 Code: [id number] Description: 99213 - WOUND CARE VISIT-LEV 3 EST PT Modifier: Quantity: 1 Physician Procedures : CPT4 Code Description Modifier [id number] 99213 - WC PHYS LEVEL 3 - EST PT ICD-10 Diagnosis Description T81.31XA Disruption of external operation (surgical) wound, not elsewhere classified, initial encounter L97.512 Non-pressure chronic ulcer of other  part of right foot with fat layer exposed Quantity: 1 Electronic Signature(s) Signed: 09/29/2022 5:40:01 PM By: Angelina Pih Signed: 09/30/2022 7:45:25 AM By: Baltazar Najjar MD Previous Signature: 09/29/2022 5:20:34 PM Version By: Baltazar Najjar MD Entered By: Angelina Pih on 09/29/2022 14:40:01

## 2022-09-30 NOTE — Progress Notes (Signed)
Raymond [name] ([id number]) [id number] Page 1 of 8 Visit Report for 08/01/2022 Arrival Information Details Patient Name: Date of Service: Raymond [name] 08/01/2022 8:30 A M Medical Record Number: [id number] Patient Account Number: [id number] Date of Birth/Sex: Treating RN: July 02, 1965 (57 y.o. [name] Primary Care Gladyse Corvin: Martie Round Other Clinician: Referring Jolane Bankhead: Treating [place] Spainhower/Extender: Gwendlyn Deutscher in Treatment: 3 Visit Information History Since Last Visit Added or deleted any medications: No Patient Arrived: Ambulatory Any new allergies or adverse reactions: No Arrival Time: 08:47 Has Dressing in Place as Prescribed: Yes Accompanied By: self Pain Present Now: No Transfer Assistance: None Patient Identification Verified: Yes Secondary Verification Process Completed: Yes Patient Requires Transmission-Based Precautions: No Patient Has Alerts: Yes Patient Alerts: ABI R 1.37 L1.26 Electronic Signature(s) Signed: 08/01/2022 9:08:13 AM By: Midge Aver MSN RN CNS WTA Entered By: Midge Aver on 08/01/2022 06:08:13 -------------------------------------------------------------------------------- Clinic Level of Care Assessment Details Patient Name: Date of Service: Raymond [name], Raymond Niemann D. 08/01/2022 8:30 A M Medical Record Number: [id number] Patient Account Number: [id number] Date of Birth/Sex: Treating RN: 06/12/1965 (57 y.o. [name] Primary Care Kimila Papaleo: Martie Round Other Clinician: Referring Arvon Schreiner: Treating Rutledge Selsor/Extender: Gwendlyn Deutscher in Treatment: 3 Clinic Level of Care Assessment Items TOOL 1 Quantity Score []  - 0 Use when EandM and Procedure is performed on INITIAL visit ASSESSMENTS - Nursing Assessment / Reassessment []  - 0 General Physical Exam (combine w/ comprehensive assessment (listed just below) when performed on new pt. evals) []  -  0 Comprehensive Assessment (HX, ROS, Risk Assessments, Wounds Hx, etc.) ASSESSMENTS - Wound and Skin Assessment / Reassessment []  - 0 Dermatologic / Skin Assessment (not related to wound area) ASSESSMENTS - Ostomy and/or Continence Assessment and Care TREZ, CONLIFFE ([id number]) [id number] Page 2 of 8 []  - 0 Incontinence Assessment and Management []  - 0 Ostomy Care Assessment and Management (repouching, etc.) PROCESS - Coordination of Care []  - 0 Simple Patient / Family Education for ongoing care []  - 0 Complex (extensive) Patient / Family Education for ongoing care []  - 0 Staff obtains Chiropractor, Records, T Results / Process Orders est []  - 0 Staff telephones HHA, Nursing Homes / Clarify orders / etc []  - 0 Routine Transfer to another Facility (non-emergent condition) []  - 0 Routine Hospital Admission (non-emergent condition) []  - 0 New Admissions / Manufacturing engineer / Ordering NPWT Apligraf, etc. , []  - 0 Emergency Hospital Admission (emergent condition) PROCESS - Special Needs []  - 0 Pediatric / Minor Patient Management []  - 0 Isolation Patient Management []  - 0 Hearing / Language / Visual special needs []  - 0 Assessment of Community assistance (transportation, D/C planning, etc.) []  - 0 Additional assistance / Altered mentation []  - 0 Support Surface(s) Assessment (bed, cushion, seat, etc.) INTERVENTIONS - Miscellaneous []  - 0 External ear exam []  - 0 Patient Transfer (multiple staff / Nurse, adult / Similar devices) []  - 0 Simple Staple / Suture removal (25 or less) []  - 0 Complex Staple / Suture removal (26 or more) []  - 0 Hypo/Hyperglycemic Management (do not check if billed separately) []  - 0 Ankle / Brachial Index (ABI) - do not check if billed separately Has the patient been seen at the hospital within the last three years: Yes Total Score: 0 Level Of Care: ____ Electronic Signature(s) Signed: 09/30/2022  2:44:30 PM By: Midge Aver MSN RN CNS WTA Entered By: Midge Aver on 08/01/2022 06:13:03 -------------------------------------------------------------------------------- Encounter Discharge Information Details Patient Name: Date of  Service: Raymond Blood D. 08/01/2022 8:30 A M Medical Record Number: [id number] Patient Account Number: [id number] Date of Birth/Sex: Treating RN: 1965/05/10 (57 y.o. [name] Primary Care Raymond [name]: Martie Round Other Clinician: Referring Raymond [name]: Treating Raymond [name]/Extender: Gwendlyn Deutscher in Treatment: 3 Encounter Discharge Information Items Post Procedure Vitals Discharge Condition: Stable Temperature (F): 98.3 [name], Raymond ([id number]) [id number] Page 3 of 8 Ambulatory Status: Ambulatory Pulse (bpm): 75 Discharge Destination: Home Respiratory Rate (breaths/min): 18 Transportation: Private Auto Blood Pressure (mmHg): 135/68 Accompanied By: self Schedule Follow-up Appointment: Yes Clinical Summary of Care: Electronic Signature(s) Signed: 09/30/2022 2:44:30 PM By: Midge Aver MSN RN CNS WTA Entered By: Midge Aver on 08/01/2022 06:13:53 -------------------------------------------------------------------------------- Lower Extremity Assessment Details Patient Name: Date of Service: Raymond Blood D. 08/01/2022 8:30 A M Medical Record Number: [id number] Patient Account Number: [id number] Date of Birth/Sex: Treating RN: 1965/03/12 (57 y.o. [name] Primary Care Barrie Wale: Martie Round Other Clinician: Referring Raymond [name]: Treating Lovely Kerins/Extender: Raymond [name] Weeks in Treatment: 3 Electronic Signature(s) Signed: 09/30/2022 2:44:30 PM By: Midge Aver MSN RN CNS WTA Entered By: Midge Aver on 08/01/2022 05:58:11 -------------------------------------------------------------------------------- Multi Wound Chart Details Patient Name: Date of  Service: Raymond Blood D. 08/01/2022 8:30 A M Medical Record Number: [id number] Patient Account Number: [id number] Date of Birth/Sex: Treating RN: October 06, 1965 (57 y.o. [name] Primary Care Lakiah Dhingra: Martie Round Other Clinician: Referring Hridaan Bouse: Treating Raymond [name]/Extender: Raymond [name] Weeks in Treatment: 3 Vital Signs Height(in): 74 Pulse(bpm): 75 Weight(lbs): 398 Blood Pressure(mmHg): 135/68 Body Mass Index(BMI): 51.1 Temperature(F): 98.3 Respiratory Rate(breaths/min): 18 [4:Photos:] [N/A:N/A] Right Amputation Site - Toe N/A N/A Wound Location: Surgical Injury N/A N/A Wounding Event: Open Surgical Wound N/A N/A Primary Etiology: Congestive Heart Failure, N/A N/A Comorbid History: Hypertension, Type II Diabetes, Neuropathy 06/09/2022 N/A N/A Date Acquired: 3 N/A N/A Weeks of Treatment: Open N/A N/A Wound Status: No N/A N/A Wound Recurrence: 1x0.5x2.5 N/A N/A Measurements L x W x D (cm) 0.393 N/A N/A A (cm) : rea 0.982 N/A N/A Volume (cm) : 76.20% N/A N/A % Reduction in Area: 77.90% N/A N/A % Reduction in Volume: Full Thickness Without Exposed N/A N/A Classification: Support Structures Medium N/A N/A Exudate Amount: Serosanguineous N/A N/A Exudate Type: red, brown N/A N/A Exudate Color: Small (1-33%) N/A N/A Granulation Amount: Red, Pink N/A N/A Granulation Quality: Small (1-33%) N/A N/A Necrotic Amount: Fat Layer (Subcutaneous Tissue): Yes N/A N/A Exposed Structures: Fascia: No Tendon: No Muscle: No Joint: No Bone: No Small (1-33%) N/A N/A Epithelialization: Treatment Notes Electronic Signature(s) Signed: 09/30/2022 2:44:30 PM By: Midge Aver MSN RN CNS WTA Entered By: Midge Aver on 08/01/2022 06:10:23 -------------------------------------------------------------------------------- Multi-Disciplinary Care Plan Details Patient Name: Date of Service: Raymond [name], Raymond Niemann D. 08/01/2022 8:30 A M Medical  Record Number: [id number] Patient Account Number: [id number] Date of Birth/Sex: Treating RN: January 13, 1966 (57 y.o. [name] Primary Care Brentlee Sciara: Martie Round Other Clinician: Referring Geneive Sandstrom: Treating Maylani Embree/Extender: Gwendlyn Deutscher in Treatment: 3 Active Inactive Necrotic Tissue Nursing Diagnoses: Impaired tissue integrity related to necrotic/devitalized tissue Knowledge deficit related to management of necrotic/devitalized tissue Goals: Necrotic/devitalized tissue will be minimized in the wound bed Date Initiated: 07/08/2022 Date Inactivated: 08/01/2022 Target Resolution Date: 08/06/2022 BRYSYN, HOWREN ([id number]) [id number] Page 5 of 8 Goal Status: Met Patient/caregiver will verbalize understanding of reason and process for debridement of necrotic tissue Date Initiated: 07/08/2022 Target Resolution Date: 08/06/2022 Goal Status: Active Interventions: Assess patient pain level pre-, during  and post procedure and prior to discharge Provide education on necrotic tissue and debridement process Treatment Activities: Excisional debridement : 07/08/2022 Notes: Wound/Skin Impairment Nursing Diagnoses: Impaired tissue integrity Knowledge deficit related to ulceration/compromised skin integrity Goals: Patient/caregiver will verbalize understanding of skin care regimen Date Initiated: 07/08/2022 Date Inactivated: 08/01/2022 Target Resolution Date: 08/06/2022 Goal Status: Met Ulcer/skin breakdown will have a volume reduction of 30% by week 4 Date Initiated: 07/08/2022 Date Inactivated: 08/01/2022 Target Resolution Date: 08/06/2022 Goal Status: Met Ulcer/skin breakdown will have a volume reduction of 50% by week 8 Date Initiated: 07/08/2022 Target Resolution Date: 09/07/2022 Goal Status: Active Ulcer/skin breakdown will have a volume reduction of 80% by week 12 Date Initiated: 07/08/2022 Target Resolution Date: 10/08/2022 Goal Status:  Active Ulcer/skin breakdown will heal within 14 weeks Date Initiated: 07/08/2022 Target Resolution Date: 10/22/2022 Goal Status: Active Interventions: Assess patient/caregiver ability to obtain necessary supplies Assess patient/caregiver ability to perform ulcer/skin care regimen upon admission and as needed Assess ulceration(s) every visit Provide education on ulcer and skin care Screen for HBO Treatment Activities: Referred to DME Melat Wrisley for dressing supplies : 07/08/2022 Skin care regimen initiated : 07/08/2022 Topical wound management initiated : 07/08/2022 Notes: Electronic Signature(s) Signed: 09/30/2022 2:44:30 PM By: Midge Aver MSN RN CNS WTA Entered By: Midge Aver on 08/01/2022 06:24:55 -------------------------------------------------------------------------------- Pain Assessment Details Patient Name: Date of Service: Raymond Blood D. 08/01/2022 8:30 A M Medical Record Number: [id number] Patient Account Number: [id number] Date of Birth/Sex: Treating RN: Jun 15, 1965 (57 y.o. [name] Primary Care Chapel Silverthorn: Martie Round Other Clinician: Referring Negar Sieler: Treating Kaelan Amble/Extender: Raymond [name] [place] ([id number]) [phone].pdf Page 6 of 8 Weeks in Treatment: 3 Active Problems Location of Pain Severity and Description of Pain Patient Has Paino No Site Locations Pain Management and Medication Current Pain Management: Electronic Signature(s) Signed: 08/01/2022 9:08:27 AM By: Midge Aver MSN RN CNS WTA Entered By: Midge Aver on 08/01/2022 06:08:26 -------------------------------------------------------------------------------- Patient/Caregiver Education Details Patient Name: Date of Service: Raymond [name] 7/1/2024andnbsp8:30 A M Medical Record Number: [id number] Patient Account Number: [id number] Date of Birth/Gender: Treating RN: [birth year] (57 y.o. [name] Primary Care  Physician: Martie Round Other Clinician: Referring Physician: Treating Physician/Extender: Gwendlyn Deutscher in Treatment: 3 Education Assessment Education Provided To: Patient Education Topics Provided Wound/Skin Impairment: Handouts: Caring for Your Ulcer Methods: Explain/Verbal Responses: State content correctly Electronic Signature(s) Signed: 09/30/2022 2:44:30 PM By: Midge Aver MSN RN CNS WTA Entered By: Midge Aver on 08/01/2022 06:25:08 Lucia Gaskins ([id number]) [id number] Page 7 of 8 -------------------------------------------------------------------------------- Wound Assessment Details Patient Name: Date of Service: Raymond [name]. 08/01/2022 8:30 A M Medical Record Number: [id number] Patient Account Number: [id number] Date of Birth/Sex: Treating RN: 17-Nov-1965 (57 y.o. [name] Primary Care Chestine Belknap: Martie Round Other Clinician: Referring Kilyn Maragh: Treating Brina Umeda/Extender: Raymond [name] Weeks in Treatment: 3 Wound Status Wound Number: 4 Primary Open Surgical Wound Etiology: Wound Location: Right Amputation Site - Toe Wound Status: Open Wounding Event: Surgical Injury Comorbid Congestive Heart Failure, Hypertension, Type II Diabetes, Date Acquired: 06/09/2022 History: Neuropathy Weeks Of Treatment: 3 Clustered Wound: No Photos Wound Measurements Length: (cm) 1 Width: (cm) 0.5 Depth: (cm) 2.5 Area: (cm) 0.393 Volume: (cm) 0.982 % Reduction in Area: 76.2% % Reduction in Volume: 77.9% Epithelialization: Small (1-33%) Wound Description Classification: Full Thickness Without Exposed Support Structures Exudate Amount: Medium Exudate Type: Serosanguineous Exudate Color: red, brown Foul Odor After Cleansing: No Slough/Fibrino Yes Wound Bed Granulation Amount:  Small (1-33%) Exposed Structure Granulation Quality: Red, Pink Fascia Exposed: No Necrotic Amount: Small  (1-33%) Fat Layer (Subcutaneous Tissue) Exposed: Yes Tendon Exposed: No Muscle Exposed: No Joint Exposed: No Bone Exposed: No Electronic Signature(s) Signed: 09/30/2022 2:44:30 PM By: Midge Aver MSN RN CNS WTA Entered By: Midge Aver on 08/01/2022 05:58:00 Lucia Gaskins ([id number]) [id number] Page 8 of 8 -------------------------------------------------------------------------------- Vitals Details Patient Name: Date of Service: Raymond [name]. 08/01/2022 8:30 A M Medical Record Number: [id number] Patient Account Number: [id number] Date of Birth/Sex: Treating RN: 12/01/1965 (57 y.o. [name] Primary Care Covey Baller: Martie Round Other Clinician: Referring Sunil Hue: Treating Kesean Serviss/Extender: Raymond [name] Weeks in Treatment: 3 Vital Signs Time Taken: 08:50 Temperature (F): 98.3 Height (in): 74 Pulse (bpm): 75 Weight (lbs): 398 Respiratory Rate (breaths/min): 18 Body Mass Index (BMI): 51.1 Blood Pressure (mmHg): 135/68 Reference Range: 80 - 120 mg / dl Electronic Signature(s) Signed: 08/01/2022 9:08:17 AM By: Midge Aver MSN RN CNS WTA Entered By: Midge Aver on 08/01/2022 06:08:17

## 2022-10-07 ENCOUNTER — Encounter: Payer: Medicare HMO | Attending: Physician Assistant | Admitting: Physician Assistant

## 2022-10-07 DIAGNOSIS — T8131XA Disruption of external operation (surgical) wound, not elsewhere classified, initial encounter: Secondary | ICD-10-CM | POA: Diagnosis present

## 2022-10-07 DIAGNOSIS — E669 Obesity, unspecified: Secondary | ICD-10-CM | POA: Insufficient documentation

## 2022-10-07 DIAGNOSIS — I5042 Chronic combined systolic (congestive) and diastolic (congestive) heart failure: Secondary | ICD-10-CM | POA: Insufficient documentation

## 2022-10-07 DIAGNOSIS — E1140 Type 2 diabetes mellitus with diabetic neuropathy, unspecified: Secondary | ICD-10-CM | POA: Diagnosis not present

## 2022-10-07 DIAGNOSIS — X58XXXA Exposure to other specified factors, initial encounter: Secondary | ICD-10-CM | POA: Insufficient documentation

## 2022-10-07 DIAGNOSIS — E11621 Type 2 diabetes mellitus with foot ulcer: Secondary | ICD-10-CM | POA: Insufficient documentation

## 2022-10-07 DIAGNOSIS — Z6841 Body Mass Index (BMI) 40.0 and over, adult: Secondary | ICD-10-CM | POA: Insufficient documentation

## 2022-10-07 DIAGNOSIS — L97512 Non-pressure chronic ulcer of other part of right foot with fat layer exposed: Secondary | ICD-10-CM | POA: Insufficient documentation

## 2022-10-07 DIAGNOSIS — J449 Chronic obstructive pulmonary disease, unspecified: Secondary | ICD-10-CM | POA: Diagnosis not present

## 2022-10-07 NOTE — Progress Notes (Signed)
TYGER, HAWKES ([id number]) [id number] Page 1 of 7 Visit Report for 10/07/2022 Arrival Information Details Patient Name: Date of Service: [name] 10/07/2022 9:15 A M Medical Record Number: [id number] Patient Account Number: [id number] Date of Birth/Sex: Treating RN: [birth year] (57 y.o. [name] Primary Care Konner Saiz: Martie Round Other Clinician: Referring Samyiah Halvorsen: Treating Sheridyn Canino/Extender: Gwendlyn Deutscher in Treatment: 13 Visit Information History Since Last Visit Added or deleted any medications: No Patient Arrived: Ambulatory Any new allergies or adverse reactions: No Arrival Time: 09:15 Had a fall or experienced change in No Accompanied By: self activities of daily living that may affect Transfer Assistance: None risk of falls: Patient Identification Verified: Yes Hospitalized since last visit: No Secondary Verification Process Completed: Yes Has Dressing in Place as Prescribed: Yes Patient Requires Transmission-Based Precautions: No Has Compression in Place as Prescribed: Yes Patient Has Alerts: Yes Pain Present Now: No Patient Alerts: ABI R 1.37 L1.26 Electronic Signature(s) Signed: 10/07/2022 1:24:25 PM By: Angelina Pih Entered By: Angelina Pih on 10/07/2022 06:16:37 -------------------------------------------------------------------------------- Clinic Level of Care Assessment Details Patient Name: Date of Service: [name] 10/07/2022 9:15 A M Medical Record Number: [id number] Patient Account Number: [id number] Date of Birth/Sex: Treating RN: [birth year] (57 y.o. [name] Primary Care Krishawn Vanderweele: Martie Round Other Clinician: Referring Davied Nocito: Treating Caliana Spires/Extender: Gwendlyn Deutscher in Treatment: 13 Clinic Level of Care Assessment Items TOOL 4 Quantity Score []  - 0 Use when only an EandM is performed on FOLLOW-UP  visit ASSESSMENTS - Nursing Assessment / Reassessment X- 1 10 Reassessment of Co-morbidities (includes updates in patient status) X- 1 5 Reassessment of Adherence to Treatment Plan ASSESSMENTS - Wound and Skin A ssessment / Reassessment X - Simple Wound Assessment / Reassessment - one wound 1 5 GOODMAN, TOENNIES ([id number]) [id number] Page 2 of 7 []  - 0 Complex Wound Assessment / Reassessment - multiple wounds []  - 0 Dermatologic / Skin Assessment (not related to wound area) ASSESSMENTS - Focused Assessment []  - 0 Circumferential Edema Measurements - multi extremities []  - 0 Nutritional Assessment / Counseling / Intervention []  - 0 Lower Extremity Assessment (monofilament, tuning fork, pulses) []  - 0 Peripheral Arterial Disease Assessment (using hand held doppler) ASSESSMENTS - Ostomy and/or Continence Assessment and Care []  - 0 Incontinence Assessment and Management []  - 0 Ostomy Care Assessment and Management (repouching, etc.) PROCESS - Coordination of Care X - Simple Patient / Family Education for ongoing care 1 15 []  - 0 Complex (extensive) Patient / Family Education for ongoing care X- 1 10 Staff obtains Chiropractor, Records, T Results / Process Orders est []  - 0 Staff telephones HHA, Nursing Homes / Clarify orders / etc []  - 0 Routine Transfer to another Facility (non-emergent condition) []  - 0 Routine Hospital Admission (non-emergent condition) []  - 0 New Admissions / Manufacturing engineer / Ordering NPWT Apligraf, etc. , []  - 0 Emergency Hospital Admission (emergent condition) X- 1 10 Simple Discharge Coordination []  - 0 Complex (extensive) Discharge Coordination PROCESS - Special Needs []  - 0 Pediatric / Minor Patient Management []  - 0 Isolation Patient Management []  - 0 Hearing / Language / Visual special needs []  - 0 Assessment of Community assistance (transportation, D/C planning, etc.) []  - 0 Additional  assistance / Altered mentation []  - 0 Support Surface(s) Assessment (bed, cushion, seat, etc.) INTERVENTIONS - Wound Cleansing / Measurement X - Simple Wound Cleansing - one wound 1 5 []  - 0 Complex Wound Cleansing - multiple  wounds X- 1 5 Wound Imaging (photographs - any number of wounds) []  - 0 Wound Tracing (instead of photographs) X- 1 5 Simple Wound Measurement - one wound []  - 0 Complex Wound Measurement - multiple wounds INTERVENTIONS - Wound Dressings X - Small Wound Dressing one or multiple wounds 1 10 []  - 0 Medium Wound Dressing one or multiple wounds []  - 0 Large Wound Dressing one or multiple wounds []  - 0 Application of Medications - topical []  - 0 Application of Medications - injection INTERVENTIONS - Miscellaneous []  - 0 External ear exam []  - 0 Specimen Collection (cultures, biopsies, blood, body fluids, etc.) []  - 0 Specimen(s) / Culture(s) sent or taken to Lab for analysis CHANEL, HOVEY ([id number]) [id number] Page 3 of 7 []  - 0 Patient Transfer (multiple staff / Nurse, adult / Similar devices) []  - 0 Simple Staple / Suture removal (25 or less) []  - 0 Complex Staple / Suture removal (26 or more) []  - 0 Hypo / Hyperglycemic Management (close monitor of Blood Glucose) []  - 0 Ankle / Brachial Index (ABI) - do not check if billed separately X- 1 5 Vital Signs Has the patient been seen at the hospital within the last three years: Yes Total Score: 85 Level Of Care: New/Established - Level 3 Electronic Signature(s) Signed: 10/07/2022 1:24:25 PM By: Angelina Pih Entered By: Angelina Pih on 10/07/2022 06:46:08 -------------------------------------------------------------------------------- Encounter Discharge Information Details Patient Name: Date of Service: [name] 10/07/2022 9:15 A M Medical Record Number: [id number] Patient Account Number: [id number] Date of Birth/Sex: Treating RN: 02-Jun-1965 (57  y.o. [name] Primary Care Garrell Flagg: Martie Round Other Clinician: Referring Babbie Dondlinger: Treating Ean Gettel/Extender: Gwendlyn Deutscher in Treatment: 13 Encounter Discharge Information Items Discharge Condition: Stable Ambulatory Status: Ambulatory Discharge Destination: Home Transportation: Private Auto Accompanied By: self Schedule Follow-up Appointment: No Clinical Summary of Care: Electronic Signature(s) Signed: 10/07/2022 1:24:25 PM By: Angelina Pih Entered By: Angelina Pih on 10/07/2022 06:46:58 -------------------------------------------------------------------------------- Lower Extremity Assessment Details Patient Name: Date of Service: [name] 10/07/2022 9:15 A M Medical Record Number: [id number] Patient Account Number: [id number] Date of Birth/Sex: Treating RN: 06-11-1965 (57 y.o. [name] Primary Care Taya Ashbaugh: Martie Round Other Clinician: Lucia Gaskins ([id number]) [id number] Page 4 of 7 Referring Mindi Akerson: Treating Guerry Covington/Extender: Gwendlyn Deutscher in Treatment: 13 Vascular Assessment Pulses: Dorsalis Pedis Palpable: [Right:Yes] Posterior Tibial Palpable: [Right:Yes] Extremity colors, hair growth, and conditions: Extremity Color: [Right:Normal] Hair Growth on Extremity: [Right:No] Temperature of Extremity: [Right:Warm < 3 seconds] Toe Nail Assessment Left: Right: Thick: Yes Discolored: Yes Deformed: No Improper Length and Hygiene: Yes Electronic Signature(s) Signed: 10/07/2022 1:24:25 PM By: Angelina Pih Entered By: Angelina Pih on 10/07/2022 06:22:33 -------------------------------------------------------------------------------- Multi-Disciplinary Care Plan Details Patient Name: Date of Service: [name] 10/07/2022 9:15 A M Medical Record Number: [id number] Patient Account Number: [id number] Date of Birth/Sex: Treating  RN: [birth year] (57 y.o. [name] Primary Care Shaasia Odle: Martie Round Other Clinician: Referring Ennio Houp: Treating Ayline Dingus/Extender: Valerie Salts Weeks in Treatment: 13 Active Inactive Electronic Signature(s) Signed: 10/07/2022 1:24:25 PM By: Angelina Pih Entered By: Angelina Pih on 10/07/2022 06:46:21 Pain Assessment Details -------------------------------------------------------------------------------- Lucia Gaskins ([id number]) [id number] Page 5 of 7 Patient Name: Date of Service: [name] 10/07/2022 9:15 A M Medical Record Number: [id number] Patient Account Number: [id number] Date of Birth/Sex: Treating RN: [birth year] (57 y.o. [name] Primary Care Jeremias Broyhill: Martie Round Other Clinician: Referring Thresea Doble:  Treating Johngabriel Verde/Extender: Gwendlyn Deutscher in Treatment: 13 Active Problems Location of Pain Severity and Description of Pain Patient Has Paino No Site Locations Rate the pain. Current Pain Level: 0 Pain Management and Medication Current Pain Management: Electronic Signature(s) Signed: 10/07/2022 1:24:25 PM By: Angelina Pih Entered By: Angelina Pih on 10/07/2022 06:17:09 -------------------------------------------------------------------------------- Patient/Caregiver Education Details Patient Name: Date of Service: [name]. 9/6/2024andnbsp9:15 A M Medical Record Number: [id number] Patient Account Number: [id number] Date of Birth/Gender: Treating RN: 10-09-1965 (57 y.o. [name] Primary Care Physician: Martie Round Other Clinician: Referring Physician: Treating Physician/Extender: Gwendlyn Deutscher in Treatment: 13 Education Assessment Education Provided To: Patient Education Topics Provided Wound/Skin Impairment: Handouts: Other: care of healed wound Methods: Explain/Verbal Responses: State  content correctly [place] ([id number]) [id number] Page 6 of 7 Electronic Signature(s) Signed: 10/07/2022 1:24:25 PM By: Angelina Pih Entered By: Angelina Pih on 10/07/2022 06:46:34 -------------------------------------------------------------------------------- Wound Assessment Details Patient Name: Date of Service: [name] 10/07/2022 9:15 A M Medical Record Number: [id number] Patient Account Number: [id number] Date of Birth/Sex: Treating RN: 1965-04-02 (57 y.o. 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 Wound Status: Healed - Epithelialized Wounding Event: Surgical Injury Comorbid Congestive Heart Failure, Hypertension, Type II Diabetes, Date Acquired: 06/09/2022 History: Neuropathy Weeks Of Treatment: 13 Clustered Wound: No Photos Wound Measurements Length: (cm) Width: (cm) Depth: (cm) Area: (cm) Volume: (cm) 0 % Reduction in Area: 100% 0 % Reduction in Volume: 100% 0 Epithelialization: Large (67-100%) 0 Tunneling: No 0 Undermining: No Wound Description Classification: Full Thickness Without Exposed Support Structures Wound Margin: Distinct, outline attached Exudate Amount: None Present Foul Odor After Cleansing: No Slough/Fibrino No Wound Bed Granulation Amount: None Present (0%) Exposed Structure Necrotic Amount: None Present (0%) Fascia Exposed: No Fat Layer (Subcutaneous Tissue) Exposed: No Tendon Exposed: No Muscle Exposed: No Joint Exposed: No Bone Exposed: No Treatment Notes Wound #4 (Amputation Site - Toe) Wound Laterality: Right WASIF, BURRUEL ([id number]) [phone] Page 7 of 7 Cleanser Peri-Wound Care Topical Primary  Dressing Secondary Dressing Secured With Compression Wrap Compression Stockings Add-Ons Electronic Signature(s) Signed: 10/07/2022 1:24:25 PM By: Angelina Pih Entered By: Angelina Pih on 10/07/2022 06:39:23 -------------------------------------------------------------------------------- Vitals Details Patient Name: Date of Service: [name] 10/07/2022 9:15 A M Medical Record Number: [id number] Patient Account Number: [id number] Date of Birth/Sex: Treating RN: 09/10/1965 (57 y.o. [name] Primary Care Shanin Szymanowski: Martie Round Other Clinician: Referring [place] Espina: Treating Carlon Davidson/Extender: Gwendlyn Deutscher in Treatment: 13 Vital Signs Time Taken: 09:15 Temperature (F): 98.3 Height (in): 74 Pulse (bpm): 72 Weight (lbs): 398 Respiratory Rate (breaths/min): 20 Body Mass Index (BMI): 51.1 Blood Pressure (mmHg): 154/64 Reference Range: 80 - 120 mg / dl Electronic Signature(s) Signed: 10/07/2022 1:24:25 PM By: Angelina Pih Entered By: Angelina Pih on 10/07/2022 06:22:11

## 2022-10-07 NOTE — Progress Notes (Addendum)
AMINE, DRABIK ([id number]) [id number] Page 1 of 9 Visit Report for 10/07/2022 Chief Complaint Document Details Patient Name: Date of Service: Raymond [name] 10/07/2022 9:15 A M Medical Record Number: [id number] Patient Account Number: [id number] Date of Birth/Sex: Treating RN: 06/23/1965 (57 y.o. [name] Primary Care Provider: Martie Round Other Clinician: Referring Provider: Treating Provider/Extender: Gwendlyn Deutscher in Treatment: 13 Information Obtained from: Patient Chief Complaint Left foot ulcer from surgical dehiscence following 2nd toe amputation right foot Electronic Signature(s) Signed: 10/07/2022 9:35:50 AM By: Allen Derry PA-C Entered By: Allen Derry on 10/07/2022 06:35:50 -------------------------------------------------------------------------------- HPI Details Patient Name: Date of Service: Raymond Blood D. 10/07/2022 9:15 A M Medical Record Number: [id number] Patient Account Number: [id number] Date of Birth/Sex: Treating RN: Sep 02, 1965 (57 y.o. [name] Primary Care Provider: Martie Round Other Clinician: Referring Provider: Treating Provider/Extender: Gwendlyn Deutscher in Treatment: 13 History of Present Illness HPI Description: 09/05/17-He is seeing an initial evaluation for a left plantar foot ulcer. He has a remote history of left great toe amputation. He states that 4-6 weeks ago he noted callus formation and ulceration. He has not seen primary care regarding this. He is not currently on antibiotic therapy. He does not routinely follow with podiatry. He states diabetic foot wear will arrive early next week. The EHR shows an A1c of 9% approximate 4 months ago but he states he had one and primary care a few weeks ago but does not know the results. He is neuropathic and does not complain of any pain, he is currently wearing crocs. 09/12/17-he is here in follow  up evaluation for left plantar foot ulcer. There is improvement in both appearance and measurement. We will continue with same treatment plan and he will follow up next week. 09/19/17 on evaluation today patient actually appears to be doing excellent in regard to his ulcer on the invitation site of his left great foot plantar aspect. He's been tolerating the dressing changes without complication. In fact with the Prisma and the current measures he has been shown signs of excellent improvement week by week up to this point. We have been to breeding the wound in this seems to have been of great benefit for him. Fortunately there is no evidence of infection. 09/26/17 on evaluation today patient appears to be doing rather well in regard to his wound. He did not know quite as much improvement this week as compared to last week. Nonetheless he still continues to show signs of improving to some degree. I do believe he may benefit from an offloading shoe. No fevers, chills, nausea, or vomiting noted at this time. HOMER, CHRISTOPHER ([id number]) [id number] Page 2 of 9 10/03/17 on evaluation today patient actually appears to be doing much better in regard to the amputation site plantar foot ulcer. Overall this appears significantly smaller even compared to previous. He's been tolerating the dressing changes without complication. He did get his diabetic shoes and I did have a look at them they appear to be fairly good. Nonetheless I do think that for the time being I would probably recommend he continue with the offloading shoe that we have been utilizing just due to the fact that with the dressing I don't know that his diabetic shoes are going to work as appropriately as far as not called an additional pressure and irritation to the area in question. He understands. 10/10/17 on evaluation today patient actually appears to be doing very well  in regard to his plantar foot ulcer. He has  been tolerating the dressing changes without complication. I do feel like he's making signs of good improvement and in fact of the wound bed appears to be better although it may not be significantly changed in size it appears healthier and I do believe is showing signs of improving. Nonetheless we gonna keep working towards healing as far as that is concerned. There is no evidence of infection. 10/17/17 on evaluation today patient actually appears to be doing poorly in regard to his plantar foot ulcer. Unfortunately other than just the area where the wound is itself there appears to be a blister that communicates with the wound unfortunately. This is more lateral to the wound itself and also to the distal point of the amputation site. There does not appear to be any evidence of cellulitis spreading at the foot but I do believe some of the drainage from the site itself is. When in nature. Nonetheless this blister area I think needs to be removed in order to allow for appropriate healing of the ulcer itself I think that is gonna be difficult for it to heal otherwise. This was discussed with the patient today. 10/24/17 on evaluation today patient actually appears to be doing better compared to last week even post debridement. He has shown signs of improvement he did test positive for Staphylococcus aureus. With that being said he notes that he's not have any discomfort and in general he does feel like things seem to been doing better. Fortunately there's no additional blistering and no evidence of remaining infection at this time. No fevers, chills, nausea, or vomiting noted at this time. He has three days of antibiotic left. 10/31/17 on evaluation today patient actually appears to be doing very well in regard to his ulcer on the left foot. He has been tolerating the dressing changes without complication. With that being said fortunately there appears to be no evidence of infection I do think he's made  progress compared to previous. No fevers, chills, nausea, or vomiting noted at this time. 11/14/17 on evaluation today patient actually appears to be doing excellent in regard to his amputation site ulcer on his foot. In fact this appears to be completely healed at this point. There is no evidence of infection nor underline abscess and I did thoroughly evaluate the periwound location. Overall I'm very pleased with how things appear. Readmission: 01-03-2022 upon evaluation today patient appears for reevaluation here in the clinic although it has been since 2019 that I last saw him. This again has been over 4 years. With that being said he is having an issue with a wound on his foot unfortunately. He has not had any recent x-rays he tells me at this point which is unfortunate as avoid definitely can need to get something started that can be one of the primary items to get moving forward with. With that being said I also think were probably can obtain a wound culture he is probably going to require some antibiotics at some point. I just want to make sure that we have him on the right thing when we do this. Currently the patient tells me that his wounds have been present in regard to the met head for about a month at this point. With that being said he also has a wound on the third toe which is the next toe this actually still present. This unfortunately has a lot of callus buildup around it but I  think it is larger underneath than what it would appear on initial inspection. Patient does have a history of diabetes mellitus type 2, congestive heart failure, and COPD. 01-11-2022 upon evaluation patient's wounds actually are showing signs of doing about the same may be just slightly cleaner but definitely not where we want things to be as of yet. Fortunately there does not appear to be any signs of active infection locally nor systemically at this point which is great news. No fevers, chills, nausea,  vomiting, or diarrhea. Of note patient has had his MRI but we do not have the results of that as of yet we are still waiting for the reading on this at this point. 01-18-2022 upon evaluation today patient presents after having had his MRI finally complete. 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 The third and fourth digits seem to be early which is good news. Nonetheless we are still going to need to try to see about getting the infection under control he is already been on the antibiotic therapy which I think has done quite well. In fact I feel like he is showing signs of improvement already. 12/28; patient with a wound on the medial aspect of the left first metatarsal head and an area on the left third toe with slight hammer deformity. He has had a previous left first toe amputation. We are using Iodoflex on the wound. He is a diabetic a recent MRI showed osteomyelitis we have been giving him Cipro and Doxy which he appears to be tolerating well. 02-10-2022 upon evaluation today patient unfortunately has issues still with open wounds of his foot on the left. He does have known osteomyelitis at this point and that is something that we have been treating with oral antibiotics. I actually have not seen him personally since 19 December. In that time he has not had any debridement from that point until today based on what I can see as best I can tell anyway. Has been on Cipro as well as doxycycline. Nonetheless he does still seem to have open wounds today and I think that he would benefit from hyperbaric oxygen therapy. I discussed that with him today as well. 02-18-2022 upon evaluation today patient appears to be doing about the same in regard to his wounds. Fortunately I do not see any signs of infection although he still has areas that seem to initially close down but that he has a lot of callus that still open at both locations most  on the metatarsal region as well as the toe. I think that he is appropriate for proceeding with the hyperbarics and I think the sooner we get this done to try to get these wounds healed the better off he will be. 02-25-2022 upon evaluation today patient's wounds do show signs of being dry get again. We were attempting to pack and some of the alginate dressing instead of doing the collagen as everything was getting very dry but nonetheless this is still continue to be an ongoing issue. My suggestion based on what I am seeing is good to be that we actually switch to doing Xeroform which should hopefully keep this a little bit more moist and from hopefully closing up prematurely. The patient voiced understanding. 03-03-2022 upon evaluation today patient appears to be doing well with regard to his wounds things as before appear to be getting dry. We are using Xeroform to try to keep it  open is much as possible but each time I see him he does tend to cover over the good news is each time he also seems to be getting a little bit smaller which is excellent. Fortunately I do not see any evidence of active infection locally nor systemically which is great news. I think this means that between the antibiotics and the hyperbarics were really on a good track here. 03-10-2022 upon evaluation today patient appears to be doing well currently in regard to his wounds. He is actually showing signs of some good improvement he is also tolerating HBO therapy very well. I do not see any evidence of active infection locally nor systemically which is great news and overall I am extremely pleased in that regard. No fevers, chills, nausea, vomiting, or diarrhea. 03-17-2022 upon evaluation today patient appears to be doing well currently in regard to his wounds in fact the toe is completely healed. The metatarsal head location though not completely healed appears to be doing much better which is great news and overall I am extremely  pleased with where we stand today. 03-24-2022 upon evaluation today patient appears to be doing well currently in regard to his wounds. He has been tolerating the dressing changes without complication. Fortunately there does not appear to be any signs of active infection locally nor systemically in fact I am not so sure this wound is very close to being healed. I did perform some debridement to clearway some of the callus and the patient tolerated this today without complication. Postdebridement the wound bed is significantly improved. 03-31-2022 upon evaluation today patient appears to be doing well currently in regard to his wound which is actually showing signs of being completely healed. This is great news. Fortunately I do believe that the patient is doing much better he still on the oral antibiotics which I think is helping to treat the osteomyelitis is also undergoing hyperbaric oxygen therapy which I think is doing a really good job here as well. SARP, FEEHAN ([id number]) [id number] Page 3 of 9 04-21-2022 upon evaluation today patient continues to remain completely closed. He seems to be doing excellent and is very close to completion of his 40 treatments of HBO therapy which that is the point where recommend discontinue therapy at that point. Fortunately I do not see any evidence of active infection locally nor systemically at this time which is great news. Readmission: 07-08-2022 unfortunately Mr. Lamba since I last saw him ended up having an issue with osteomyelitis of his great toe right side which included having to have amputation. This amputation was performed actually on 06-08-2022 and was again amputated secondary to the osteomyelitis. With that being said following however this has dehisced and therefore the patient was referred to [place] for further evaluation and treatment of the dehisced surgical wound at the amputation site of the right great  toe. His past medical history really has not changed significantly since I last saw him in the first of the year we will be doing hyperbarics for the left foot we are able to get all this healed that still is doing well according to what the patient tells me at this point. 07-15-2022 upon evaluation today patient appears to be doing well currently in regard to his wound. This in fact is showing signs of good improvement in my opinion. Fortunately I do not see any evidence of active infection locally nor systemically which is great news. No fevers, chills, nausea, vomiting, or diarrhea. 07-25-2022 upon  evaluation today patient appears to be doing well currently in regard to his foot ulcer is actually measuring smaller and looking better I am actually pleased in that regard. We did get the results back from his pathology that showed necrotic bone but nothing that appears to be actually open at this point. Fortunately I do not see any signs of active infection at this time. 08-01-22 upon evaluation today patient appears to be doing well currently in regard to his wound which is showing signs of improvement I think we may want to switch over to collagen however. 08-08-2022 upon evaluation patient's wound is actually showing signs of significant improvement. Fortunately I do not see any signs of active infection at this time which is great news and in general I do believe that we are making headway towards complete closure which is great news as well. 08-15-2022 upon evaluation today patient is making excellent progress towards closure. This wound looks to be doing excellent. Is not nearly as deep as what it was and I think that he is making great progress towards where we want to be. I am very pleased at this time. 7/23; this is a right second toe amputation site. We have been using Promogran change every 2 nd day 09-01-2022 upon evaluation today patient appears to be doing excellent in regard to his wound on the  foot. He has been tolerating the dressing changes without complication and in general seems to be making really good progress towards complete closure. I do not see any signs of infection which is great news. No fevers, chills, nausea, vomiting, or diarrhea. 09-08-2022 upon evaluation today patient appears to be doing well currently in regard to his foot ulcer this is getting very close to complete resolution were not 100% there but again I am extremely pleased with where things stand currently. I do not see any evidence of active infection locally or systemically which is great news. No fevers, chills, nausea, vomiting, or diarrhea. 09-15-2022 upon evaluation today patient appears to be doing excellent in regard to his wound. Has been tolerating the dressing changes without complication. Fortunately there does not appear to be any signs of active infection at this time. Fortunately I do not see any evidence of worsening overall and I believe that the patient is making good headway towards complete closure 8/29; patient's wound is at the amputation site of her first and second toe. Very tiny open area remains. We have been using Xeroform. He is changing this daily. 10-07-2022 upon evaluation today patient appears to be doing excellent in fact he appears to be completely healed based on what I am seeing. I do not see any evidence of worsening overall which is good news. Electronic Signature(s) Signed: 10/07/2022 10:28:38 AM By: Allen Derry PA-C Entered By: Allen Derry on 10/07/2022 07:28:38 -------------------------------------------------------------------------------- Physical Exam Details Patient Name: Date of Service: Raymond Blood D. 10/07/2022 9:15 A M Medical Record Number: [id number] Patient Account Number: [id number] Date of Birth/Sex: Treating RN: [birth year] (57 y.o. [name] Primary Care Provider: Martie Round Other Clinician: Referring Provider: Treating  Provider/Extender: Valerie Salts Weeks in Treatment: 13 Constitutional Obese and well-hydrated in no acute distress. Respiratory normal breathing without difficulty. JOHNAS, CATTS ([id number]) [id number] Page 4 of 9 Psychiatric this patient is able to make decisions and demonstrates good insight into disease process. Alert and Oriented x 3. pleasant and cooperative. Notes Patient's wound currently showed signs of complete epithelization and seems to be doing quite  well. Fortunately I am extremely pleased with where we are today. Electronic Signature(s) Signed: 10/07/2022 10:37:50 AM By: Allen Derry PA-C Entered By: Allen Derry on 10/07/2022 07:37:50 -------------------------------------------------------------------------------- Physician Orders Details Patient Name: Date of Service: Raymond Blood D. 10/07/2022 9:15 A M Medical Record Number: [id number] Patient Account Number: [id number] Date of Birth/Sex: Treating RN: 05-21-1965 (57 y.o. [name] Primary Care Provider: Martie Round Other Clinician: Referring Provider: Treating Provider/Extender: Gwendlyn Deutscher in Treatment: 13 Verbal / Phone Orders: No Diagnosis Coding ICD-10 Coding Code Description T81.31XA Disruption of external operation (surgical) wound, not elsewhere classified, initial encounter L97.512 Non-pressure chronic ulcer of other part of right foot with fat layer exposed E11.621 Type 2 diabetes mellitus with foot ulcer I50.42 Chronic combined systolic (congestive) and diastolic (congestive) heart failure J44.9 Chronic obstructive pulmonary disease, unspecified Discharge From [facility] Treatment Complete - AandD ointment to area at night time, wear a bandage to cover area during the day to protect healed area, you may leave open to air at night time. You may shower but please do not scrub  healed area. Please call us with any issues to healed area. Electronic Signature(s) Signed: 10/07/2022 1:24:25 PM By: Angelina Pih Signed: 10/07/2022 2:02:35 PM By: Allen Derry PA-C Entered By: Angelina Pih on 10/07/2022 06:43:11 -------------------------------------------------------------------------------- Problem List Details Patient Name: Date of Service: Raymond Blood D. 10/07/2022 9:15 A M Medical Record Number: [id number] Patient Account Number: [id number] TYSHEEM, LEIMAN ([id number]) [id number] Page 5 of 9 Date of Birth/Sex: Treating RN: 1965/11/30 (57 y.o. [name] Primary Care Provider: Martie Round Other Clinician: Referring Provider: Treating Provider/Extender: Gwendlyn Deutscher in Treatment: 13 Active Problems ICD-10 Encounter Code Description Active Date MDM Diagnosis T81.31XA Disruption of external operation (surgical) wound, not elsewhere classified, 07/08/2022 No Yes initial encounter L97.512 Non-pressure chronic ulcer of other part of right foot with fat layer exposed 07/08/2022 No Yes E11.621 Type 2 diabetes mellitus with foot ulcer 07/08/2022 No Yes I50.42 Chronic combined systolic (congestive) and diastolic (congestive) heart failure 07/08/2022 No Yes J44.9 Chronic obstructive pulmonary disease, unspecified 07/08/2022 No Yes Inactive Problems Resolved Problems Electronic Signature(s) Signed: 10/07/2022 9:35:47 AM By: Allen Derry PA-C Entered By: Allen Derry on 10/07/2022 06:35:47 -------------------------------------------------------------------------------- Progress Note Details Patient Name: Date of Service: Raymond Blood D. 10/07/2022 9:15 A M Medical Record Number: [id number] Patient Account Number: [id number] Date of Birth/Sex: Treating RN: July 24, 1965 (57 y.o. [name] Primary Care Provider: Martie Round Other Clinician: Referring Provider: Treating Provider/Extender:  Gwendlyn Deutscher in Treatment: 13 Subjective Chief Complaint Information obtained from Patient Left foot ulcer from surgical dehiscence following 2nd toe amputation right foot History of Present Illness (HPI) 09/05/17-He is seeing an initial evaluation for a left plantar foot ulcer. He has a remote history of left great toe amputation. He states that 4-6 weeks ago he noted callus formation and ulceration. He has not seen primary care regarding this. He is not currently on antibiotic therapy. He does not routinely follow with podiatry. He states diabetic foot wear will arrive early next week. The EHR shows an A1c of 9% approximate 4 months ago but he states he had one and primary care a few weeks ago but does not know the results. He is neuropathic and does not complain of any pain, he is currently wearing crocs. 09/12/17-he is here in follow up evaluation for left plantar foot ulcer. There is improvement in both  appearance and measurement. We will continue with same NEFTALI, HARTLING ([id number]) [id number] Page 6 of 9 treatment plan and he will follow up next week. 09/19/17 on evaluation today patient actually appears to be doing excellent in regard to his ulcer on the invitation site of his left great foot plantar aspect. He's been tolerating the dressing changes without complication. In fact with the Prisma and the current measures he has been shown signs of excellent improvement week by week up to this point. We have been to breeding the wound in this seems to have been of great benefit for him. Fortunately there is no evidence of infection. 09/26/17 on evaluation today patient appears to be doing rather well in regard to his wound. He did not know quite as much improvement this week as compared to last week. Nonetheless he still continues to show signs of improving to some degree. I do believe he may benefit from an offloading shoe. No  fevers, chills, nausea, or vomiting noted at this time. 10/03/17 on evaluation today patient actually appears to be doing much better in regard to the amputation site plantar foot ulcer. Overall this appears significantly smaller even compared to previous. He's been tolerating the dressing changes without complication. He did get his diabetic shoes and I did have a look at them they appear to be fairly good. Nonetheless I do think that for the time being I would probably recommend he continue with the offloading shoe that we have been utilizing just due to the fact that with the dressing I don't know that his diabetic shoes are going to work as appropriately as far as not called an additional pressure and irritation to the area in question. He understands. 10/10/17 on evaluation today patient actually appears to be doing very well in regard to his plantar foot ulcer. He has been tolerating the dressing changes without complication. I do feel like he's making signs of good improvement and in fact of the wound bed appears to be better although it may not be significantly changed in size it appears healthier and I do believe is showing signs of improving. Nonetheless we gonna keep working towards healing as far as that is concerned. There is no evidence of infection. 10/17/17 on evaluation today patient actually appears to be doing poorly in regard to his plantar foot ulcer. Unfortunately other than just the area where the wound is itself there appears to be a blister that communicates with the wound unfortunately. This is more lateral to the wound itself and also to the distal point of the amputation site. There does not appear to be any evidence of cellulitis spreading at the foot but I do believe some of the drainage from the site itself is. When in nature. Nonetheless this blister area I think needs to be removed in order to allow for appropriate healing of the ulcer itself I think that is gonna  be difficult for it to heal otherwise. This was discussed with the patient today. 10/24/17 on evaluation today patient actually appears to be doing better compared to last week even post debridement. He has shown signs of improvement he did test positive for Staphylococcus aureus. With that being said he notes that he's not have any discomfort and in general he does feel like things seem to been doing better. Fortunately there's no additional blistering and no evidence of remaining infection at this time. No fevers, chills, nausea, or vomiting noted at this time. He has three days of antibiotic  left. 10/31/17 on evaluation today patient actually appears to be doing very well in regard to his ulcer on the left foot. He has been tolerating the dressing changes without complication. With that being said fortunately there appears to be no evidence of infection I do think he's made progress compared to previous. No fevers, chills, nausea, or vomiting noted at this time. 11/14/17 on evaluation today patient actually appears to be doing excellent in regard to his amputation site ulcer on his foot. In fact this appears to be completely healed at this point. There is no evidence of infection nor underline abscess and I did thoroughly evaluate the periwound location. Overall I'm very pleased with how things appear. Readmission: 01-03-2022 upon evaluation today patient appears for reevaluation here in the clinic although it has been since 2019 that I last saw him. This again has been over 4 years. With that being said he is having an issue with a wound on his foot unfortunately. He has not had any recent x-rays he tells me at this point which is unfortunate as avoid definitely can need to get something started that can be one of the primary items to get moving forward with. With that being said I also think were probably can obtain a wound culture he is probably going to require some antibiotics at some point. I  just want to make sure that we have him on the right thing when we do this. Currently the patient tells me that his wounds have been present in regard to the met head for about a month at this point. With that being said he also has a wound on the third toe which is the next toe this actually still present. This unfortunately has a lot of callus buildup around it but I think it is larger underneath than what it would appear on initial inspection. Patient does have a history of diabetes mellitus type 2, congestive heart failure, and COPD. 01-11-2022 upon evaluation patient's wounds actually are showing signs of doing about the same may be just slightly cleaner but definitely not where we want things to be as of yet. Fortunately there does not appear to be any signs of active infection locally nor systemically at this point which is great news. No fevers, chills, nausea, vomiting, or diarrhea. Of note patient has had his MRI but we do not have the results of that as of yet we are still waiting for the reading on this at this point. 01-18-2022 upon evaluation today patient presents after having had his MRI finally complete. 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 The third and fourth digits seem to be early which is good news. Nonetheless we are still going to need to try to see about getting the infection under control he is already been on the antibiotic therapy which I think has done quite well. In fact I feel like he is showing signs of improvement already. 12/28; patient with a wound on the medial aspect of the left first metatarsal head and an area on the left third toe with slight hammer deformity. He has had a previous left first toe amputation. We are using Iodoflex on the wound. He is a diabetic a recent MRI showed osteomyelitis we have been giving him Cipro and Doxy which he appears to be tolerating well. 02-10-2022 upon  evaluation today patient unfortunately has issues still with open wounds of his foot on the  left. He does have known osteomyelitis at this point and that is something that we have been treating with oral antibiotics. I actually have not seen him personally since 19 December. In that time he has not had any debridement from that point until today based on what I can see as best I can tell anyway. Has been on Cipro as well as doxycycline. Nonetheless he does still seem to have open wounds today and I think that he would benefit from hyperbaric oxygen therapy. I discussed that with him today as well. 02-18-2022 upon evaluation today patient appears to be doing about the same in regard to his wounds. Fortunately I do not see any signs of infection although he still has areas that seem to initially close down but that he has a lot of callus that still open at both locations most on the metatarsal region as well as the toe. I think that he is appropriate for proceeding with the hyperbarics and I think the sooner we get this done to try to get these wounds healed the better off he will be. 02-25-2022 upon evaluation today patient's wounds do show signs of being dry get again. We were attempting to pack and some of the alginate dressing instead of doing the collagen as everything was getting very dry but nonetheless this is still continue to be an ongoing issue. My suggestion based on what I am seeing is good to be that we actually switch to doing Xeroform which should hopefully keep this a little bit more moist and from hopefully closing up prematurely. The patient voiced understanding. 03-03-2022 upon evaluation today patient appears to be doing well with regard to his wounds things as before appear to be getting dry. We are using Xeroform to try to keep it open is much as possible but each time I see him he does tend to cover over the good news is each time he also seems to be getting a little bit smaller which  is excellent. Fortunately I do not see any evidence of active infection locally nor systemically which is great news. I think this means that between the antibiotics and the hyperbarics were really on a good track here. 03-10-2022 upon evaluation today patient appears to be doing well currently in regard to his wounds. He is actually showing signs of some good improvement he is also tolerating HBO therapy very well. I do not see any evidence of active infection locally nor systemically which is great news and overall I am extremely pleased in that regard. No fevers, chills, nausea, vomiting, or diarrhea. KLINT, KAAIHUE ([id number]) [id number] Page 7 of 9 03-17-2022 upon evaluation today patient appears to be doing well currently in regard to his wounds in fact the toe is completely healed. The metatarsal head location though not completely healed appears to be doing much better which is great news and overall I am extremely pleased with where we stand today. 03-24-2022 upon evaluation today patient appears to be doing well currently in regard to his wounds. He has been tolerating the dressing changes without complication. Fortunately there does not appear to be any signs of active infection locally nor systemically in fact I am not so sure this wound is very close to being healed. I did perform some debridement to clearway some of the callus and the patient tolerated this today without complication. Postdebridement the wound bed is significantly improved. 03-31-2022 upon evaluation today patient appears to be doing well currently in regard to his  wound which is actually showing signs of being completely healed. This is great news. Fortunately I do believe that the patient is doing much better he still on the oral antibiotics which I think is helping to treat the osteomyelitis is also undergoing hyperbaric oxygen therapy which I think is doing a really good job here as  well. 04-21-2022 upon evaluation today patient continues to remain completely closed. He seems to be doing excellent and is very close to completion of his 40 treatments of HBO therapy which that is the point where recommend discontinue therapy at that point. Fortunately I do not see any evidence of active infection locally nor systemically at this time which is great news. Readmission: 07-08-2022 unfortunately Mr. Butters since I last saw him ended up having an issue with osteomyelitis of his great toe right side which included having to have amputation. This amputation was performed actually on 06-08-2022 and was again amputated secondary to the osteomyelitis. With that being said following however this has dehisced and therefore the patient was referred to [place] for further evaluation and treatment of the dehisced surgical wound at the amputation site of the right great toe. His past medical history really has not changed significantly since I last saw him in the first of the year we will be doing hyperbarics for the left foot we are able to get all this healed that still is doing well according to what the patient tells me at this point. 07-15-2022 upon evaluation today patient appears to be doing well currently in regard to his wound. This in fact is showing signs of good improvement in my opinion. Fortunately I do not see any evidence of active infection locally nor systemically which is great news. No fevers, chills, nausea, vomiting, or diarrhea. 07-25-2022 upon evaluation today patient appears to be doing well currently in regard to his foot ulcer is actually measuring smaller and looking better I am actually pleased in that regard. We did get the results back from his pathology that showed necrotic bone but nothing that appears to be actually open at this point. Fortunately I do not see any signs of active infection at this time. 08-01-22 upon evaluation today patient appears to be doing well  currently in regard to his wound which is showing signs of improvement I think we may want to switch over to collagen however. 08-08-2022 upon evaluation patient's wound is actually showing signs of significant improvement. Fortunately I do not see any signs of active infection at this time which is great news and in general I do believe that we are making headway towards complete closure which is great news as well. 08-15-2022 upon evaluation today patient is making excellent progress towards closure. This wound looks to be doing excellent. Is not nearly as deep as what it was and I think that he is making great progress towards where we want to be. I am very pleased at this time. 7/23; this is a right second toe amputation site. We have been using Promogran change every 2 nd day 09-01-2022 upon evaluation today patient appears to be doing excellent in regard to his wound on the foot. He has been tolerating the dressing changes without complication and in general seems to be making really good progress towards complete closure. I do not see any signs of infection which is great news. No fevers, chills, nausea, vomiting, or diarrhea. 09-08-2022 upon evaluation today patient appears to be doing well currently in regard to his foot ulcer this  is getting very close to complete resolution were not 100% there but again I am extremely pleased with where things stand currently. I do not see any evidence of active infection locally or systemically which is great news. No fevers, chills, nausea, vomiting, or diarrhea. 09-15-2022 upon evaluation today patient appears to be doing excellent in regard to his wound. Has been tolerating the dressing changes without complication. Fortunately there does not appear to be any signs of active infection at this time. Fortunately I do not see any evidence of worsening overall and I believe that the patient is making good headway towards complete closure 8/29; patient's wound is  at the amputation site of her first and second toe. Very tiny open area remains. We have been using Xeroform. He is changing this daily. 10-07-2022 upon evaluation today patient appears to be doing excellent in fact he appears to be completely healed based on what I am seeing. I do not see any evidence of worsening overall which is good news. Objective Constitutional Obese and well-hydrated in no acute distress. Vitals Time Taken: 9:15 AM, Height: 74 in, Weight: 398 lbs, BMI: 51.1, Temperature: 98.3 F, Pulse: 72 bpm, Respiratory Rate: 20 breaths/min, Blood Pressure: 154/64 mmHg. Respiratory normal breathing without difficulty. Psychiatric this patient is able to make decisions and demonstrates good insight into disease process. Alert and Oriented x 3. pleasant and cooperative. General Notes: Patient's wound currently showed signs of complete epithelization and seems to be doing quite well. Fortunately I am extremely pleased with where we are today. Integumentary (Hair, Skin) Raymond, [name] ([id number]) [id number] Page 8 of 9 Wound #4 status is Healed - Epithelialized. Original cause of wound was Surgical Injury. The date acquired was: 06/09/2022. The wound has been in treatment 13 weeks. The wound is located on the Right Amputation Site - T The wound measures 0cm length x 0cm width x 0cm depth; 0cm^2 area and 0cm^3 volume. oe. There is no tunneling or undermining noted. There is a none present amount of drainage noted. The wound margin is distinct with the outline attached to the wound base. There is no granulation within the wound bed. There is no necrotic tissue within the wound bed. Assessment Active Problems ICD-10 Disruption of external operation (surgical) wound, not elsewhere classified, initial encounter Non-pressure chronic ulcer of other part of right foot with fat layer exposed Type 2 diabetes mellitus with foot ulcer Chronic combined systolic  (congestive) and diastolic (congestive) heart failure Chronic obstructive pulmonary disease, unspecified Plan Discharge From [facility]: Discharge from Wound Care Center Treatment Complete - AandD ointment to area at night time, wear a bandage to cover area during the day to protect healed area, you may leave open to air at night time. You may shower but please do not scrub healed area. Please call us with any issues to healed area. 1. I am going to recommend that we have the patient continue to monitor for any signs of infection or worsening. Based on what I am seeing I do believe that we will make an excellent headway towards complete closure. 2. I am good recommend as well the patient should continue to protect this area and allow the skin to toughen up I recommend AandD ointment at night and a Band-Aid to cover during the day to prevent anything from rubbing. Will see him back for follow-up visit as needed. Electronic Signature(s) Signed: 10/07/2022 10:38:21 AM By: Allen Derry PA-C Entered By: Allen Derry on 10/07/2022 07:38:21 -------------------------------------------------------------------------------- SuperBill Details Patient Name:  Date of Service: Raymond [name] 10/07/2022 Medical Record Number: [id number] Patient Account Number: [id number] Date of Birth/Sex: Treating RN: 1965-02-21 (57 y.o. [name] Primary Care Provider: Martie Round Other Clinician: Referring Provider: Treating Provider/Extender: Gwendlyn Deutscher in Treatment: 13 Diagnosis Coding ICD-10 Codes Code Description T81.31XA Disruption of external operation (surgical) wound, not elsewhere classified, initial encounter L97.512 Non-pressure chronic ulcer of other part of right foot with fat layer exposed E11.621 Type 2 diabetes mellitus with foot ulcer I50.42 Chronic combined systolic (congestive) and diastolic (congestive) heart failure J44.9 Chronic obstructive pulmonary  disease, unspecified AMINE, DRABIK ([id number]) [phone].pdf Page 9 of 9 Facility Procedures : CPT4 Code: 40347425 Description: 99213 - WOUND CARE VISIT-LEV 3 EST PT Modifier: Quantity: 1 Physician Procedures : CPT4 Code Description Modifier [id number] 99213 - WC PHYS LEVEL 3 - EST PT ICD-10 Diagnosis Description T81.31XA Disruption of external operation (surgical) wound, not elsewhere classified, initial encounter L97.512 Non-pressure chronic ulcer of other  part of right foot with fat layer exposed E11.621 Type 2 diabetes mellitus with foot ulcer I50.42 Chronic combined systolic (congestive) and diastolic (congestive) heart failure Quantity: 1 Electronic Signature(s) Signed: 10/07/2022 10:40:22 AM By: Allen Derry PA-C Entered By: Allen Derry on 10/07/2022 07:40:22

## 2022-10-13 ENCOUNTER — Ambulatory Visit: Payer: Medicare HMO | Admitting: Physician Assistant

## 2022-10-20 ENCOUNTER — Ambulatory Visit (INDEPENDENT_AMBULATORY_CARE_PROVIDER_SITE_OTHER): Payer: Medicare HMO | Admitting: Podiatry

## 2022-10-20 DIAGNOSIS — Z89421 Acquired absence of other right toe(s): Secondary | ICD-10-CM | POA: Diagnosis not present

## 2022-10-20 NOTE — Progress Notes (Signed)
Subjective:  [name], male    DOB: March 23, 1965,  MRN: [id number]  Chief Complaint  Patient presents with   Wound Check    DOS: 06/09/2022 Procedure: Right second digit amputation  57 y.o. male returns for post-op check.  He states is doing okay.  Wound care center helped considerably.  His wound is healed up he has been discharged  Review of Systems: Negative except as noted in the HPI. Denies N/V/F/Ch.  Past Medical History:  Diagnosis Date   Arthritis    hands   Cellulitis    CHF (congestive heart failure) (HCC)    Chronic diastolic heart failure (HCC)    Chronic respiratory failure with hypoxia (HCC)    2 L [place] continuously   Diabetes mellitus type 2, insulin dependent (HCC)    Edema    FEET/LEGS   GERD (gastroesophageal reflux disease)    Hepatic steatosis    HOH (hard of hearing)    Hypertension    Morbid obesity (HCC)    Morbid obesity with BMI of 45.0-49.9, adult (HCC)    Neuropathy    Orthopnea    OSA on CPAP    TRILOGY VENTILATOR   Oxygen deficiency    2L  HS   PVD (peripheral vascular disease) (HCC)    Shortness of breath dyspnea    Systolic heart failure (HCC)    Preserved EF 50-55%    Current Outpatient Medications:    aspirin EC 81 MG tablet, Take 81 mg by mouth daily., Disp: , Rfl:    atorvastatin (LIPITOR) 40 MG tablet, atorvastatin 40 mg tablet, Disp: , Rfl:    carvedilol (COREG) 6.25 MG tablet, Take 1 tablet (6.25 mg total) by mouth 2 (two) times daily., Disp: 60 tablet, Rfl: 11   empagliflozin (JARDIANCE) 25 MG TABS tablet, Take by mouth daily. Once daily in the am, Disp: , Rfl:    gabapentin (NEURONTIN) 300 MG capsule, Take 300 mg by mouth 3 (three) times daily., Disp: , Rfl:    insulin lispro (HUMALOG) 100 UNIT/ML cartridge, Inject 60 Units into the skin 3 (three) times daily with meals., Disp: , Rfl:    LANTUS 100 UNIT/ML injection, Inject 90 Units into the skin 2 (two) times daily., Disp: , Rfl:    liraglutide  (VICTOZA) 18 MG/3ML SOPN, Inject 1.2 mg into the skin in the morning and at bedtime., Disp: , Rfl:    metFORMIN (GLUCOPHAGE) 1000 MG tablet, Take 1,000 mg by mouth 2 (two) times daily with a meal., Disp: , Rfl:    Multiple Vitamins-Minerals (CENTRUM SILVER 50+MEN) TABS, Take 1 tablet by mouth daily., Disp: , Rfl:    omega-3 acid ethyl esters (LOVAZA) 1 g capsule, Take 1 g by mouth daily., Disp: , Rfl:    OXYGEN, Inhale 2 L into the lungs daily., Disp: , Rfl:    pantoprazole (PROTONIX) 40 MG tablet, Take 40 mg by mouth daily., Disp: , Rfl:    torsemide (DEMADEX) 20 MG tablet, Take 2 tablets (40 mg total) by mouth daily., Disp: 60 tablet, Rfl: 2   TRUEPLUS INSULIN SYRINGE 29G X 1/2" 1 ML MISC, , Disp: , Rfl:    TRUEPLUS PEN NEEDLES 31G X 8 MM MISC, , Disp: , Rfl:    valsartan (DIOVAN) 160 MG tablet, Take 160 mg by mouth daily., Disp: , Rfl:   Social History   Tobacco Use  Smoking Status Never  Smokeless Tobacco Never    No Known Allergies Objective:  There were no  vitals filed for this visit. There is no height or weight on file to calculate BMI. Constitutional Well developed. Well nourished.  Vascular Foot warm and well perfused. Capillary refill normal to all digits.   Neurologic Normal speech. Oriented to person, place, and time. Epicritic sensation to light touch grossly present bilaterally.  Dermatologic No further dehiscence noted no complication noted.  Ulceration has completely reepithelialized.  Orthopedic: No further tenderness to palpation noted about the surgical site.   Radiographs: None Assessment:   1. History of amputation of lesser toe of right foot (HCC)     Plan:  Patient was evaluated and treated and all questions answered.  S/p foot surgery right with amputation site wound -Clinically healed and officially discharged from my care.  At this time I discussed prevention technique shoe gear modification if any foot and ankle issues arise in the future if any  other ulceration starts he will come and see me right away.  He states understanding  No follow-ups on file.

## 2023-02-14 ENCOUNTER — Emergency Department: Payer: Medicare HMO

## 2023-02-14 ENCOUNTER — Inpatient Hospital Stay: Payer: Medicare HMO

## 2023-02-14 ENCOUNTER — Encounter: Payer: Self-pay | Admitting: Emergency Medicine

## 2023-02-14 ENCOUNTER — Inpatient Hospital Stay
Admission: EM | Admit: 2023-02-14 | Discharge: 2023-02-27 | DRG: 064 | Disposition: A | Discharge status: Home Health | Payer: Medicare HMO | Attending: Internal Medicine | Admitting: Internal Medicine

## 2023-02-14 ENCOUNTER — Other Ambulatory Visit: Payer: Self-pay

## 2023-02-14 DIAGNOSIS — G8102 Flaccid hemiplegia affecting left dominant side: Secondary | ICD-10-CM

## 2023-02-14 DIAGNOSIS — Z7984 Long term (current) use of oral hypoglycemic drugs: Secondary | ICD-10-CM

## 2023-02-14 DIAGNOSIS — Z794 Long term (current) use of insulin: Secondary | ICD-10-CM

## 2023-02-14 DIAGNOSIS — E1151 Type 2 diabetes mellitus with diabetic peripheral angiopathy without gangrene: Secondary | ICD-10-CM | POA: Diagnosis present

## 2023-02-14 DIAGNOSIS — E1165 Type 2 diabetes mellitus with hyperglycemia: Secondary | ICD-10-CM | POA: Diagnosis present

## 2023-02-14 DIAGNOSIS — R531 Weakness: Secondary | ICD-10-CM | POA: Diagnosis not present

## 2023-02-14 DIAGNOSIS — I5043 Acute on chronic combined systolic (congestive) and diastolic (congestive) heart failure: Secondary | ICD-10-CM | POA: Diagnosis present

## 2023-02-14 DIAGNOSIS — E11620 Type 2 diabetes mellitus with diabetic dermatitis: Secondary | ICD-10-CM | POA: Diagnosis not present

## 2023-02-14 DIAGNOSIS — Z833 Family history of diabetes mellitus: Secondary | ICD-10-CM

## 2023-02-14 DIAGNOSIS — N179 Acute kidney failure, unspecified: Secondary | ICD-10-CM | POA: Diagnosis present

## 2023-02-14 DIAGNOSIS — K219 Gastro-esophageal reflux disease without esophagitis: Secondary | ICD-10-CM | POA: Diagnosis present

## 2023-02-14 DIAGNOSIS — R4701 Aphasia: Secondary | ICD-10-CM | POA: Diagnosis present

## 2023-02-14 DIAGNOSIS — I5033 Acute on chronic diastolic (congestive) heart failure: Secondary | ICD-10-CM | POA: Diagnosis not present

## 2023-02-14 DIAGNOSIS — E11649 Type 2 diabetes mellitus with hypoglycemia without coma: Secondary | ICD-10-CM | POA: Diagnosis not present

## 2023-02-14 DIAGNOSIS — J9602 Acute respiratory failure with hypercapnia: Secondary | ICD-10-CM | POA: Diagnosis present

## 2023-02-14 DIAGNOSIS — D509 Iron deficiency anemia, unspecified: Secondary | ICD-10-CM | POA: Diagnosis present

## 2023-02-14 DIAGNOSIS — Z7982 Long term (current) use of aspirin: Secondary | ICD-10-CM

## 2023-02-14 DIAGNOSIS — E66813 Obesity, class 3: Secondary | ICD-10-CM | POA: Diagnosis present

## 2023-02-14 DIAGNOSIS — I110 Hypertensive heart disease with heart failure: Secondary | ICD-10-CM | POA: Diagnosis present

## 2023-02-14 DIAGNOSIS — I10 Essential (primary) hypertension: Secondary | ICD-10-CM | POA: Diagnosis not present

## 2023-02-14 DIAGNOSIS — G4733 Obstructive sleep apnea (adult) (pediatric): Secondary | ICD-10-CM | POA: Diagnosis present

## 2023-02-14 DIAGNOSIS — R1313 Dysphagia, pharyngeal phase: Secondary | ICD-10-CM

## 2023-02-14 DIAGNOSIS — Z7401 Bed confinement status: Secondary | ICD-10-CM | POA: Diagnosis not present

## 2023-02-14 DIAGNOSIS — I69952 Hemiplegia and hemiparesis following unspecified cerebrovascular disease affecting left dominant side: Secondary | ICD-10-CM | POA: Diagnosis not present

## 2023-02-14 DIAGNOSIS — Z79899 Other long term (current) drug therapy: Secondary | ICD-10-CM

## 2023-02-14 DIAGNOSIS — R471 Dysarthria and anarthria: Secondary | ICD-10-CM | POA: Diagnosis present

## 2023-02-14 DIAGNOSIS — Z823 Family history of stroke: Secondary | ICD-10-CM

## 2023-02-14 DIAGNOSIS — D72829 Elevated white blood cell count, unspecified: Secondary | ICD-10-CM | POA: Diagnosis present

## 2023-02-14 DIAGNOSIS — L8990 Pressure ulcer of unspecified site, unspecified stage: Secondary | ICD-10-CM | POA: Insufficient documentation

## 2023-02-14 DIAGNOSIS — Z751 Person awaiting admission to adequate facility elsewhere: Secondary | ICD-10-CM

## 2023-02-14 DIAGNOSIS — G8194 Hemiplegia, unspecified affecting left nondominant side: Secondary | ICD-10-CM | POA: Diagnosis present

## 2023-02-14 DIAGNOSIS — Z89421 Acquired absence of other right toe(s): Secondary | ICD-10-CM

## 2023-02-14 DIAGNOSIS — I639 Cerebral infarction, unspecified: Secondary | ICD-10-CM | POA: Diagnosis present

## 2023-02-14 DIAGNOSIS — I6350 Cerebral infarction due to unspecified occlusion or stenosis of unspecified cerebral artery: Secondary | ICD-10-CM | POA: Diagnosis present

## 2023-02-14 DIAGNOSIS — Z9049 Acquired absence of other specified parts of digestive tract: Secondary | ICD-10-CM

## 2023-02-14 DIAGNOSIS — J9601 Acute respiratory failure with hypoxia: Secondary | ICD-10-CM | POA: Diagnosis not present

## 2023-02-14 DIAGNOSIS — Z6841 Body Mass Index (BMI) 40.0 and over, adult: Secondary | ICD-10-CM | POA: Diagnosis not present

## 2023-02-14 DIAGNOSIS — Z8249 Family history of ischemic heart disease and other diseases of the circulatory system: Secondary | ICD-10-CM

## 2023-02-14 DIAGNOSIS — J9622 Acute and chronic respiratory failure with hypercapnia: Secondary | ICD-10-CM | POA: Diagnosis present

## 2023-02-14 DIAGNOSIS — E870 Hyperosmolality and hypernatremia: Secondary | ICD-10-CM | POA: Diagnosis not present

## 2023-02-14 DIAGNOSIS — J9621 Acute and chronic respiratory failure with hypoxia: Secondary | ICD-10-CM | POA: Diagnosis present

## 2023-02-14 DIAGNOSIS — Z8673 Personal history of transient ischemic attack (TIA), and cerebral infarction without residual deficits: Secondary | ICD-10-CM

## 2023-02-14 DIAGNOSIS — D508 Other iron deficiency anemias: Secondary | ICD-10-CM | POA: Diagnosis not present

## 2023-02-14 DIAGNOSIS — E785 Hyperlipidemia, unspecified: Secondary | ICD-10-CM | POA: Diagnosis present

## 2023-02-14 DIAGNOSIS — Z7902 Long term (current) use of antithrombotics/antiplatelets: Secondary | ICD-10-CM

## 2023-02-14 DIAGNOSIS — E119 Type 2 diabetes mellitus without complications: Secondary | ICD-10-CM

## 2023-02-14 DIAGNOSIS — R29706 NIHSS score 6: Secondary | ICD-10-CM | POA: Diagnosis present

## 2023-02-14 DIAGNOSIS — Z9981 Dependence on supplemental oxygen: Secondary | ICD-10-CM

## 2023-02-14 DIAGNOSIS — Z89422 Acquired absence of other left toe(s): Secondary | ICD-10-CM

## 2023-02-14 LAB — URINALYSIS, COMPLETE (UACMP) WITH MICROSCOPIC
Bacteria, UA: NONE SEEN
Bilirubin Urine: NEGATIVE
Glucose, UA: 150 mg/dL — AB
Hgb urine dipstick: NEGATIVE
Ketones, ur: NEGATIVE mg/dL
Leukocytes,Ua: NEGATIVE
Nitrite: NEGATIVE
Protein, ur: NEGATIVE mg/dL
RBC / HPF: 0 RBC/hpf (ref 0–5)
Specific Gravity, Urine: 1.019 (ref 1.005–1.030)
pH: 5 (ref 5.0–8.0)

## 2023-02-14 LAB — APTT: aPTT: 30 s (ref 24–36)

## 2023-02-14 LAB — DIFFERENTIAL
Abs Immature Granulocytes: 0.05 10*3/uL (ref 0.00–0.07)
Basophils Absolute: 0.1 10*3/uL (ref 0.0–0.1)
Basophils Relative: 1 %
Eosinophils Absolute: 0.3 10*3/uL (ref 0.0–0.5)
Eosinophils Relative: 3 %
Immature Granulocytes: 0 %
Lymphocytes Relative: 26 %
Lymphs Abs: 3.3 10*3/uL (ref 0.7–4.0)
Monocytes Absolute: 1.4 10*3/uL — ABNORMAL HIGH (ref 0.1–1.0)
Monocytes Relative: 11 %
Neutro Abs: 7.7 10*3/uL (ref 1.7–7.7)
Neutrophils Relative %: 59 %

## 2023-02-14 LAB — COMPREHENSIVE METABOLIC PANEL
ALT: 26 U/L (ref 0–44)
AST: 15 U/L (ref 15–41)
Albumin: 3.3 g/dL — ABNORMAL LOW (ref 3.5–5.0)
Alkaline Phosphatase: 70 U/L (ref 38–126)
Anion gap: 10 (ref 5–15)
BUN: 30 mg/dL — ABNORMAL HIGH (ref 6–20)
CO2: 31 mmol/L (ref 22–32)
Calcium: 8.1 mg/dL — ABNORMAL LOW (ref 8.9–10.3)
Chloride: 102 mmol/L (ref 98–111)
Creatinine, Ser: 2.01 mg/dL — ABNORMAL HIGH (ref 0.61–1.24)
GFR, Estimated: 38 mL/min — ABNORMAL LOW (ref >60)
Glucose, Bld: 97 mg/dL (ref 70–99)
Potassium: 4.1 mmol/L (ref 3.5–5.1)
Sodium: 143 mmol/L (ref 135–145)
Total Bilirubin: 0.8 mg/dL (ref 0.0–1.2)
Total Protein: 6.8 g/dL (ref 6.5–8.1)

## 2023-02-14 LAB — URINE DRUG SCREEN, QUALITATIVE (ARMC ONLY)
Amphetamines, Ur Screen: NOT DETECTED
Barbiturates, Ur Screen: NOT DETECTED
Benzodiazepine, Ur Scrn: NOT DETECTED
Cannabinoid 50 Ng, Ur ~~LOC~~: NOT DETECTED
Cocaine Metabolite,Ur ~~LOC~~: NOT DETECTED
MDMA (Ecstasy)Ur Screen: NOT DETECTED
Methadone Scn, Ur: NOT DETECTED
Opiate, Ur Screen: NOT DETECTED
Phencyclidine (PCP) Ur S: NOT DETECTED
Tricyclic, Ur Screen: NOT DETECTED

## 2023-02-14 LAB — BLOOD GAS, VENOUS
Acid-Base Excess: 3 mmol/L — ABNORMAL HIGH (ref 0.0–2.0)
Acid-Base Excess: 4.7 mmol/L — ABNORMAL HIGH (ref 0.0–2.0)
Acid-Base Excess: 5.2 mmol/L — ABNORMAL HIGH (ref 0.0–2.0)
Bicarbonate: 33.1 mmol/L — ABNORMAL HIGH (ref 20.0–28.0)
Bicarbonate: 35.2 mmol/L — ABNORMAL HIGH (ref 20.0–28.0)
Bicarbonate: 34.2 mmol/L — ABNORMAL HIGH (ref 20.0–28.0)
Delivery systems: POSITIVE
FIO2: 35 %
O2 Saturation: 79.3 %
O2 Saturation: 66.9 %
O2 Saturation: 60.5 %
Patient temperature: 37
Patient temperature: 37
Patient temperature: 37
pCO2, Ven: 79 mm[Hg] (ref 44–60)
pCO2, Ven: 84 mm[Hg] (ref 44–60)
pCO2, Ven: 78 mm[Hg] (ref 44–60)
pH, Ven: 7.23 — ABNORMAL LOW (ref 7.25–7.43)
pH, Ven: 7.23 — ABNORMAL LOW (ref 7.25–7.43)
pH, Ven: 7.25 (ref 7.25–7.43)
pO2, Ven: 55 mm[Hg] — ABNORMAL HIGH (ref 32–45)
pO2, Ven: 46 mm[Hg] — ABNORMAL HIGH (ref 32–45)
pO2, Ven: 40 mm[Hg] (ref 32–45)

## 2023-02-14 LAB — CBG MONITORING, ED
Glucose-Capillary: 127 mg/dL — ABNORMAL HIGH (ref 70–99)
Glucose-Capillary: 26 mg/dL — CL (ref 70–99)
Glucose-Capillary: 89 mg/dL (ref 70–99)
Glucose-Capillary: 59 mg/dL — ABNORMAL LOW (ref 70–99)
Glucose-Capillary: 49 mg/dL — ABNORMAL LOW (ref 70–99)
Glucose-Capillary: 65 mg/dL — ABNORMAL LOW (ref 70–99)
Glucose-Capillary: 87 mg/dL (ref 70–99)
Glucose-Capillary: 81 mg/dL (ref 70–99)

## 2023-02-14 LAB — FERRITIN: Ferritin: 16 ng/mL — ABNORMAL LOW (ref 24–336)

## 2023-02-14 LAB — RETICULOCYTES
Immature Retic Fract: 26.6 % — ABNORMAL HIGH (ref 2.3–15.9)
RBC.: 4.3 MIL/uL (ref 4.22–5.81)
Retic Count, Absolute: 129.5 10*3/uL (ref 19.0–186.0)
Retic Ct Pct: 3 % (ref 0.4–3.1)

## 2023-02-14 LAB — CBC
HCT: 36.9 % — ABNORMAL LOW (ref 39.0–52.0)
Hemoglobin: 10 g/dL — ABNORMAL LOW (ref 13.0–17.0)
MCH: 21.4 pg — ABNORMAL LOW (ref 26.0–34.0)
MCHC: 27.1 g/dL — ABNORMAL LOW (ref 30.0–36.0)
MCV: 78.8 fL — ABNORMAL LOW (ref 80.0–100.0)
Platelets: 290 10*3/uL (ref 150–400)
RBC: 4.68 MIL/uL (ref 4.22–5.81)
RDW: 18.6 % — ABNORMAL HIGH (ref 11.5–15.5)
WBC: 12.9 10*3/uL — ABNORMAL HIGH (ref 4.0–10.5)
nRBC: 1.1 % — ABNORMAL HIGH (ref 0.0–0.2)

## 2023-02-14 LAB — IRON AND TIBC
Iron: 19 ug/dL — ABNORMAL LOW (ref 45–182)
Saturation Ratios: 5 % — ABNORMAL LOW (ref 17.9–39.5)
TIBC: 410 ug/dL (ref 250–450)
UIBC: 391 ug/dL

## 2023-02-14 LAB — VITAMIN B12: Vitamin B-12: 789 pg/mL (ref 180–914)

## 2023-02-14 LAB — ETHANOL: Alcohol, Ethyl (B): <10 mg/dL (ref <10)

## 2023-02-14 LAB — HEMOGLOBIN A1C
Hgb A1c MFr Bld: 6.7 % — ABNORMAL HIGH (ref 4.8–5.6)
Mean Plasma Glucose: 145.59 mg/dL

## 2023-02-14 LAB — PROTIME-INR
INR: 1.1 (ref 0.8–1.2)
Prothrombin Time: 14.3 s (ref 11.4–15.2)

## 2023-02-14 LAB — FOLATE: Folate: 15.3 ng/mL (ref >5.9)

## 2023-02-14 LAB — BRAIN NATRIURETIC PEPTIDE: B Natriuretic Peptide: 231.3 pg/mL — ABNORMAL HIGH (ref 0.0–100.0)

## 2023-02-14 MED ORDER — ONDANSETRON HCL 4 MG/2ML IJ SOLN: 4.0000 mg | Freq: Four times a day (QID) | INTRAMUSCULAR | Status: DC | PRN

## 2023-02-14 MED ORDER — FUROSEMIDE 10 MG/ML IJ SOLN
40.0000 mg | Freq: Two times a day (BID) | INTRAMUSCULAR | Status: DC
  Administered 2023-02-14 – 2023-02-15 (×3): 40 mg via INTRAVENOUS
  Filled 2023-02-14 (×3): qty 4

## 2023-02-14 MED ORDER — ENOXAPARIN SODIUM 40 MG/0.4ML IJ SOSY: 40.0000 mg | PREFILLED_SYRINGE | INTRAMUSCULAR | Status: DC

## 2023-02-14 MED ORDER — IOHEXOL 350 MG/ML SOLN
75.0000 mL | Freq: Once | INTRAVENOUS | Status: AC | PRN
  Administered 2023-02-14: 75 mL via INTRAVENOUS

## 2023-02-14 MED ORDER — LABETALOL HCL 5 MG/ML IV SOLN: 10.0000 mg | INTRAVENOUS | Status: DC | PRN

## 2023-02-14 MED ORDER — FE FUM-VIT C-VIT B12-FA 460-60-0.01-1 MG PO CAPS
1.0000 | ORAL_CAPSULE | Freq: Two times a day (BID) | ORAL | Status: DC
Start: 2023-02-14 — End: 2023-02-27
  Administered 2023-02-15 – 2023-02-27 (×24): 1 via ORAL
  Filled 2023-02-14 (×29): qty 1

## 2023-02-14 MED ORDER — ACETAMINOPHEN 325 MG PO TABS
650.0000 mg | ORAL_TABLET | Freq: Four times a day (QID) | ORAL | Status: DC | PRN
  Administered 2023-02-15 – 2023-02-16 (×2): 650 mg via ORAL
  Filled 2023-02-14 (×3): qty 2

## 2023-02-14 MED ORDER — DEXTROSE 50 % IV SOLN
INTRAVENOUS | Status: AC
  Filled 2023-02-14: qty 50

## 2023-02-14 MED ORDER — INSULIN GLARGINE-YFGN 100 UNIT/ML ~~LOC~~ SOLN
30.0000 [IU] | Freq: Two times a day (BID) | SUBCUTANEOUS | Status: DC
  Filled 2023-02-14: qty 0.3

## 2023-02-14 MED ORDER — INSULIN ASPART 100 UNIT/ML IJ SOLN
0.0000 [IU] | Freq: Every day | INTRAMUSCULAR | Status: DC
  Administered 2023-02-18: 2 [IU] via SUBCUTANEOUS
  Administered 2023-02-19: 3 [IU] via SUBCUTANEOUS
  Administered 2023-02-20: 5 [IU] via SUBCUTANEOUS
  Administered 2023-02-21: 4 [IU] via SUBCUTANEOUS
  Administered 2023-02-22: 2 [IU] via SUBCUTANEOUS
  Administered 2023-02-23: 3 [IU] via SUBCUTANEOUS
  Administered 2023-02-24: 2 [IU] via SUBCUTANEOUS
  Administered 2023-02-25: 5 [IU] via SUBCUTANEOUS
  Administered 2023-02-26: 2 [IU] via SUBCUTANEOUS
  Filled 2023-02-14 (×10): qty 1

## 2023-02-14 MED ORDER — ACETAMINOPHEN 650 MG RE SUPP: 650.0000 mg | Freq: Four times a day (QID) | RECTAL | Status: DC | PRN

## 2023-02-14 MED ORDER — ONDANSETRON HCL 4 MG PO TABS: 4.0000 mg | ORAL_TABLET | Freq: Four times a day (QID) | ORAL | Status: DC | PRN

## 2023-02-14 MED ORDER — INSULIN ASPART 100 UNIT/ML IJ SOLN
0.0000 [IU] | Freq: Three times a day (TID) | INTRAMUSCULAR | Status: DC
  Administered 2023-02-15: 7 [IU] via SUBCUTANEOUS
  Administered 2023-02-17: 3 [IU] via SUBCUTANEOUS
  Administered 2023-02-17: 4 [IU] via SUBCUTANEOUS
  Administered 2023-02-17: 3 [IU] via SUBCUTANEOUS
  Administered 2023-02-18 (×2): 4 [IU] via SUBCUTANEOUS
  Administered 2023-02-18 – 2023-02-19 (×2): 11 [IU] via SUBCUTANEOUS
  Administered 2023-02-19: 7 [IU] via SUBCUTANEOUS
  Administered 2023-02-19: 15 [IU] via SUBCUTANEOUS
  Administered 2023-02-20: 7 [IU] via SUBCUTANEOUS
  Administered 2023-02-20: 15 [IU] via SUBCUTANEOUS
  Administered 2023-02-20: 11 [IU] via SUBCUTANEOUS
  Administered 2023-02-21 (×3): 15 [IU] via SUBCUTANEOUS
  Administered 2023-02-22 (×2): 20 [IU] via SUBCUTANEOUS
  Administered 2023-02-22: 15 [IU] via SUBCUTANEOUS
  Administered 2023-02-23 (×3): 11 [IU] via SUBCUTANEOUS
  Administered 2023-02-24 (×2): 7 [IU] via SUBCUTANEOUS
  Administered 2023-02-24: 4 [IU] via SUBCUTANEOUS
  Administered 2023-02-25: 7 [IU] via SUBCUTANEOUS
  Administered 2023-02-25: 11 [IU] via SUBCUTANEOUS
  Administered 2023-02-25 – 2023-02-26 (×2): 7 [IU] via SUBCUTANEOUS
  Administered 2023-02-26 – 2023-02-27 (×3): 4 [IU] via SUBCUTANEOUS
  Filled 2023-02-14 (×32): qty 1

## 2023-02-14 MED ORDER — STROKE: EARLY STAGES OF RECOVERY BOOK
Freq: Once | Status: AC
Start: 2023-02-15 — End: 2023-02-15

## 2023-02-14 MED ORDER — IOHEXOL 350 MG/ML SOLN
40.0000 mL | Freq: Once | INTRAVENOUS | Status: AC | PRN
  Administered 2023-02-14: 40 mL via INTRAVENOUS

## 2023-02-14 MED ORDER — POLYETHYLENE GLYCOL 3350 17 G PO PACK: 17.0000 g | PACK | Freq: Every day | ORAL | Status: DC | PRN

## 2023-02-14 MED ORDER — ASPIRIN 81 MG PO TBEC
81.0000 mg | DELAYED_RELEASE_TABLET | Freq: Every day | ORAL | Status: DC
  Administered 2023-02-14 – 2023-02-27 (×14): 81 mg via ORAL
  Filled 2023-02-14 (×14): qty 1

## 2023-02-14 MED ORDER — DEXTROSE 50 % IV SOLN
50.0000 mL | Freq: Once | INTRAVENOUS | Status: AC
  Filled 2023-02-14: qty 50

## 2023-02-14 MED ORDER — ATORVASTATIN CALCIUM 20 MG PO TABS
40.0000 mg | ORAL_TABLET | Freq: Every day | ORAL | Status: DC
Start: 2023-02-14 — End: 2023-02-27
  Administered 2023-02-14 – 2023-02-27 (×14): 40 mg via ORAL
  Filled 2023-02-14 (×13): qty 2

## 2023-02-14 MED ORDER — FUROSEMIDE 10 MG/ML IJ SOLN
60.0000 mg | Freq: Once | INTRAMUSCULAR | Status: AC
  Administered 2023-02-14: 60 mg via INTRAVENOUS
  Filled 2023-02-14: qty 8

## 2023-02-14 MED ORDER — DEXTROSE 50 % IV SOLN
INTRAVENOUS | Status: AC
  Administered 2023-02-14: 50 mL via INTRAVENOUS
  Filled 2023-02-14: qty 50

## 2023-02-14 MED ORDER — CLOPIDOGREL BISULFATE 75 MG PO TABS
75.0000 mg | ORAL_TABLET | Freq: Every day | ORAL | Status: DC
Start: 2023-02-14 — End: 2023-02-27
  Administered 2023-02-14 – 2023-02-27 (×14): 75 mg via ORAL
  Filled 2023-02-14 (×14): qty 1

## 2023-02-14 MED ORDER — FUROSEMIDE 10 MG/ML IJ SOLN: 40.0000 mg | Freq: Two times a day (BID) | INTRAMUSCULAR | Status: DC

## 2023-02-14 NOTE — ED Notes (Signed)
 Speech at bedside to assess pt at this time. Pt pulled up in bed and repositioned.

## 2023-02-14 NOTE — Assessment & Plan Note (Deleted)
 On admission, Hemoglobin at 10 with MCV of 78.  Baseline seems to be above 11, slowly decreasing.  No obvious bleeding.  Colonoscopy last year with removal of few polyps with negative pathology. -Check anemia panel -Start him on supplement -Outpatient follow-up with PCP  02-15-2023 HgB 9.8 today. Iron  studies show iron  deficiency.  Iron /TIBC/Ferritin/ %Sat    Component Value Date/Time   IRON  19 (L) 02/14/2023 1350   TIBC 410 02/14/2023 1350   FERRITIN 16 (L) 02/14/2023 1350   IRONPCTSAT 5 (L) 02/14/2023 1350

## 2023-02-14 NOTE — H&P (Signed)
 History and Physical    Patient: Raymond [name] Syring FMW:[id number] DOB: 01-31-1966 DOA: 02/14/2023 DOS: the patient was seen and examined on 02/14/2023 PCP: Jacques Garre, NP  Patient coming from: Home  Chief Complaint:  Chief Complaint  Patient presents with   Code Stroke   HPI: Raymond [name] Raymond [name] is a 58 y.o. male with medical history significant of morbid obesity, type 2 diabetes, chronic HFpEF,  hypertension and OSA on CPAP came to ED with complaint of left-sided weakness and dysarthria.  Patient went to bed around 10 PM yesterday and was at baseline, this morning when he woke up he was having left-sided weakness and difficulty speaking so EMS was called.  Patient was also having some shortness of breath and confusion.  He was hypoxic at 75% with EMS.  Brought in as code stroke.  Teleneurology was consulted.  He was out of window for thrombolytics.  At baseline patient morbidly obese and does not move around a lot.  Per family he was using CPAP at night.  Patient denies any recent illnesses, chest pain.  He does have some orthopnea and exertional dyspnea.  Some worsening lower extremity edema.  No recent change in appetite or bowel habits.  No urinary symptoms.  ED course and data reviewed.  Vital stable except hypoxia, VBG with hypoxia and hypercarbia so he was placed on BiPAP. Labs with leukocytosis at 12.9, hemoglobin 10.0, baseline seems to be above 11, creatinine 2.01 with baseline around 1, BUN 30.  BNP 231.  Chest x-ray with low lung volume, cardiomegaly and concern of pulmonary vascular congestion. CT head with chronic infarcts in no acute abnormality. CTA head and neck with no LVO.  Neurology was consulted and he received 60 mg of IV Lasix . Started on BiPAP.  Review of Systems: As mentioned in the history of present illness. All other systems reviewed and are negative. Past Medical History:  Diagnosis Date   Arthritis    hands   Cellulitis    CHF  (congestive heart failure) (HCC)    Chronic diastolic heart failure (HCC)    Chronic respiratory failure with hypoxia (HCC)    2 L [place] continuously   Diabetes mellitus type 2, insulin  dependent (HCC)    Edema    FEET/LEGS   GERD (gastroesophageal reflux disease)    Hepatic steatosis    HOH (hard of hearing)    Hypertension    Morbid obesity (HCC)    Morbid obesity with BMI of 45.0-49.9, adult (HCC)    Neuropathy    Orthopnea    OSA on CPAP    TRILOGY VENTILATOR   Oxygen  deficiency    2L  HS   PVD (peripheral vascular disease) (HCC)    Shortness of breath dyspnea    Systolic heart failure (HCC)    Preserved EF 50-55%   Past Surgical History:  Procedure Laterality Date   AMPUTATION Right 10/02/2014   Procedure: AMPUTATION RAY;  Surgeon: Donnice Cory, DPM;  Location: [facility] ORS;  Service: Podiatry;  Laterality: Right;   AMPUTATION TOE Right 06/09/2022   Procedure: AMPUTATION 2ND TOE RIGHT FOOT;  Surgeon: Tobie Franky SQUIBB, DPM;  Location: [facility];  Service: Podiatry;  Laterality: Right;   CATARACT EXTRACTION W/PHACO Right 09/10/2015   Procedure: CATARACT EXTRACTION PHACO AND INTRAOCULAR LENS PLACEMENT (IOC);  Surgeon: Elsie Carmine, MD;  Location: [facility];  Service: Ophthalmology;  Laterality: Right;  US  01:34 AP% 19.3 CDE 18.36 Fluid pack lot # [id number]   CATARACT EXTRACTION W/PHACO Left 10/06/2015  Procedure: CATARACT EXTRACTION PHACO AND INTRAOCULAR LENS PLACEMENT (IOC);  Surgeon: Elsie Carmine, MD;  Location: [facility];  Service: Ophthalmology;  Laterality: Left;  US  00:56 AP% 19.5 CDE 11.02 Fluid Pack # [id number]   CHOLECYSTECTOMY     COLONOSCOPY WITH PROPOFOL  N/A 05/17/2016   Procedure: COLONOSCOPY WITH PROPOFOL ;  Surgeon: Gladis RAYMOND Mariner, MD;  Location: [facility] ENDOSCOPY;  Service: Endoscopy;  Laterality: N/A;   COLONOSCOPY WITH PROPOFOL  N/A 12/27/2021   Procedure: COLONOSCOPY WITH PROPOFOL ;  Surgeon: Therisa Bi, MD;  Location: [facility] ENDOSCOPY;  Service: Gastroenterology;   Laterality: N/A;   EYE SURGERY Left    bilat laser   HIP SURGERY Right    8 th grade pins   LOWER EXTREMITY ANGIOGRAPHY Right 06/08/2022   Procedure: Lower Extremity Angiography;  Surgeon: Jama Cordella MATSU, MD;  Location: [facility] INVASIVE CV LAB;  Service: Cardiovascular;  Laterality: Right;   NOSE SURGERY     PERIPHERAL VASCULAR CATHETERIZATION Right 10/02/2014   Procedure: Lower Extremity Angiography;  Surgeon: Selinda GORMAN Gu, MD;  Location: [facility] INVASIVE CV LAB;  Service: Cardiovascular;  Laterality: Right;   TOE AMPUTATION Left 2010   2cd   TOE AMPUTATION Right 2009   Social History:  reports that he has never smoked. He has never used smokeless tobacco. He reports that he does not drink alcohol and does not use drugs.  No Known Allergies  Family History  Problem Relation Age of Onset   Hypertension Mother    Diabetes Mother    Stroke Father    Hypertension Father    Heart attack Father     Prior to Admission medications   Medication Sig Start Date End Date Taking? Authorizing Provider  BYETTA 10 MCG PEN 10 MCG/0.04ML SOPN injection Inject 10 mcg into the skin 2 (two) times daily with a meal. 12/26/22  Yes [provider]  aspirin  EC 81 MG tablet Take 81 mg by mouth daily.    [provider]  atorvastatin  (LIPITOR) 40 MG tablet atorvastatin  40 mg tablet    [provider]  carvedilol  (COREG ) 12.5 MG tablet Take 12.5 mg by mouth 2 (two) times daily.    [provider]  carvedilol  (COREG ) 6.25 MG tablet Take 1 tablet (6.25 mg total) by mouth 2 (two) times daily. 04/30/17 09/09/22  Rai, Nydia POUR, MD  empagliflozin  (JARDIANCE ) 25 MG TABS tablet Take by mouth daily. Once daily in the am    [provider]  gabapentin  (NEURONTIN ) 300 MG capsule Take 300 mg by mouth 3 (three) times daily.    [provider]  insulin  lispro (HUMALOG ) 100 UNIT/ML cartridge Inject 60 Units into the skin 3 (three) times daily with meals.    [provider]  LANTUS  100 UNIT/ML injection Inject 90 Units into the skin 2 (two) times daily. 04/28/22   [provider]  liraglutide  (VICTOZA ) 18 MG/3ML SOPN Inject 1.2 mg into the skin in the morning and at bedtime.    [provider]  metFORMIN  (GLUCOPHAGE ) 1000 MG tablet Take 1,000 mg by mouth 2 (two) times daily with a meal.    [provider]  Multiple Vitamins-Minerals (CENTRUM SILVER 50+MEN) TABS Take 1 tablet by mouth daily.    [provider]  omega-3 acid ethyl esters (LOVAZA ) 1 g capsule Take 1 g by mouth daily.    [provider]  OXYGEN  Inhale 2 L into the lungs daily.    [provider]  pantoprazole  (PROTONIX ) 40 MG tablet Take 40 mg  by mouth daily.    [provider]  torsemide  (DEMADEX ) 20 MG tablet Take 2 tablets (40 mg total) by mouth daily. 05/01/17   Rai, Nydia POUR, MD  TRUEPLUS INSULIN  SYRINGE 29G X 1/2 1 ML MISC  02/16/18   [provider]  TRUEPLUS PEN NEEDLES 31G X 8 MM MISC  02/06/18   [provider]  valsartan (DIOVAN) 160 MG tablet Take 160 mg by mouth daily. 07/11/22   [provider]    Physical Exam: Vitals:   02/14/23 1100 02/14/23 1115 02/14/23 1130 02/14/23 1146  BP: 134/67  129/67   Pulse: 62 61 81   Resp: (!) 21 20 20    Temp:    98.7 F (37.1 C)  TempSrc:    Axillary  SpO2: 93% 92% 91%   Weight:      Height:        General: Vital signs reviewed.  Morbidly obese gentleman, appears lethargic with BiPAP. Head: Normocephalic and atraumatic. Eyes: EOMI, conjunctivae normal, no scleral icterus.  Neck: Supple, trachea midline, normal ROM,  Cardiovascular: RRR, S1 normal, S2 normal, no murmurs, gallops, or rubs. Pulmonary/Chest: Clear to auscultation bilaterally, no wheezes, rales, or rhonchi. Abdominal: Soft, non-tender, non-distended, BS +,  Extremities: 2+ LE lower extremity edema bilaterally,  Neurological: Lethargic and little somnolent, cranial nerve II-XII  are grossly intact, 3/5 on left upper extremity, sensory intact to light touch bilaterally.  Psychiatric: Normal mood and affect.    Data Reviewed: Prior data reviewed as mentioned above.  Assessment and Plan: * CVA (cerebral vascular accident) (HCC) Presented with left-sided weakness and some dysarthria when he woke up this morning, LKW around 10 PM last night, out of window for thrombolytic.  Initial CT head was negative for any acute abnormality, CTA negative for LVO.  Neurology was consulted. Continue to have left sided weakness. -Admit to progressive care -MRI brain-body habitus might be a challenge. -Lipid profile and A1c -PT and OT evaluation -Echocardiogram -Aspirin  and Plavix  as DAPT, if MRI is negative and symptoms resolved then we can discontinue Plavix . -Continue home Lipitor  Acute on chronic heart failure with preserved ejection fraction (HFpEF) (HCC) BNP elevated at 235, clinically appears volume overloaded with lower extremity edema and pulmonary vascular congestion. Prior echocardiogram with normal EF, indeterminate diastolic function.  Received a dose of IV Lasix . -Repeat echocardiogram ordered as stroke workup -Continue with IV Lasix  40 mg twice daily -Daily weight and BMP -Strict intake and output   Acute on chronic respiratory failure with hypoxia and hypercapnia (HCC) Found to have hypoxia and hypercarbia.  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  Patient was using CPAP at home, not sure about the compliance.  He was placed on BiPAP in ED. chest x-ray with concern of pulmonary vascular congestion. uses 2 L of oxygen  at baseline -Use BiPAP at night and while taking a nap. -Continue with supplemental oxygen -keep the saturation above 90% -Incentive spirometry  Type 2 diabetes mellitus (HCC) Patient had 1 episode of hypoglycemia, he has not eaten since last night.  Uses a lot of insulin  at home. -Continue to monitor -Semglee  at 30 units  twice daily which is half the home dose -Resistant SSI -Check A1c  Essential hypertension Blood pressure currently within goal.  Patient was on torsemide , Coreg  and valsartan at home. -Holding home antihypertensives for permissive hypertension. -Labetalol  as needed for systolic above 200  AKI (acute kidney injury) (HCC) Creatinine at 2 with baseline  appears to be at  1. -Patient is being diuresed -Monitor renal function -Avoid nephrotoxins  Hypochromic anemia Hemoglobin at 10 with MCV of 78.  Baseline seems to be above 11, slowly decreasing.  No obvious bleeding.  Colonoscopy last year with removal of few polyps with negative pathology. -Check anemia panel -Start him on supplement -Outpatient follow-up with PCP  Obesity, Class III, BMI 40-49.9 (morbid obesity) (HCC) Estimated body mass index is 57.44 kg/m as calculated from the following:   Height as of this encounter: 6' 2 (1.88 m).   Weight as of this encounter: 202.9 kg.   -This will complicate overall prognosis -Encouraged weight loss    Advance Care Planning:   Code Status: Full Code   Consults: Neurology  Family Communication: Discussed with sister and girlfriend at bedside  Severity of Illness: The appropriate patient status for this patient is INPATIENT. Inpatient status is judged to be reasonable and necessary in order to provide the required intensity of service to ensure the patient's safety. The patient's presenting symptoms, physical exam findings, and initial radiographic and laboratory data in the context of their chronic comorbidities is felt to place them at high risk for further clinical deterioration. Furthermore, it is not anticipated that the patient will be medically stable for discharge from the hospital within 2 midnights of admission.   * I certify that at the point of admission it is my clinical judgment that the patient will require inpatient hospital care spanning beyond 2 midnights from the point  of admission due to high intensity of service, high risk for further deterioration and high frequency of surveillance required.*  This record has been created using Conservation officer, historic buildings. Errors have been sought and corrected,but may not always be located. Such creation errors do not reflect on the standard of care.   Author: Amaryllis Dare, MD 02/14/2023 1:36 PM  For on call review [email or web address].

## 2023-02-14 NOTE — Evaluation (Addendum)
 Clinical/Bedside Swallow Evaluation Patient Details  Name: Raymond [name] MRN: [id number] Date of Birth: [birth year]  Today's Date: 02/14/2023 Time: SLP Start Time (ACUTE ONLY): 1505 SLP Stop Time (ACUTE ONLY): 1605 SLP Time Calculation (min) (ACUTE ONLY): 60 min  Past Medical History:  Past Medical History:  Diagnosis Date   Arthritis    hands   Cellulitis    CHF (congestive heart failure) (HCC)    Chronic diastolic heart failure (HCC)    Chronic respiratory failure with hypoxia (HCC)    2 L [place] continuously   Diabetes mellitus type 2, insulin  dependent (HCC)    Edema    FEET/LEGS   GERD (gastroesophageal reflux disease)    Hepatic steatosis    HOH (hard of hearing)    Hypertension    Morbid obesity (HCC)    Morbid obesity with BMI of 45.0-49.9, adult (HCC)    Neuropathy    Orthopnea    OSA on CPAP    TRILOGY VENTILATOR   Oxygen  deficiency    2L  HS   PVD (peripheral vascular disease) (HCC)    Shortness of breath dyspnea    Systolic heart failure (HCC)    Preserved EF 50-55%   Past Surgical History:  Past Surgical History:  Procedure Laterality Date   AMPUTATION Right 10/02/2014   Procedure: AMPUTATION RAY;  Surgeon: Donnice Cory, DPM;  Location: [facility] OR[facility];  Service: Podiatry;  Laterality: Right;   AMPUTATION TOE Right 06/09/2022   Procedure: AMPUTATION 2ND TOE RIGHT FOOT;  Surgeon: Tobie Franky SQUIBB, DPM;  Location: [facility];  Service: Podiatry;  Laterality: Right;   CATARACT EXTRACTION W/PHACO Right 09/10/2015   Procedure: CATARACT EXTRACTION PHACO AND INTRAOCULAR LENS PLACEMENT (IOC);  Surgeon: Elsie Carmine, MD;  Location: [facility];  Service: Ophthalmology;  Laterality: Right;  US  01:34 AP% 19.3 CDE 18.36 Fluid pack lot # [id number]   CATARACT EXTRACTION W/PHACO Left 10/06/2015   Procedure: CATARACT EXTRACTION PHACO AND INTRAOCULAR LENS PLACEMENT (IOC);  Surgeon: Elsie Carmine, MD;  Location: [facility];  Service: Ophthalmology;  Laterality: Left;  US   00:56 AP% 19.5 CDE 11.02 Fluid Pack # [id number]   CHOLECYSTECTOMY     COLONOSCOPY WITH PROPOFOL  N/A 05/17/2016   Procedure: COLONOSCOPY WITH PROPOFOL ;  Surgeon: Gladis RAYMOND Mariner, MD;  Location: [facility] ENDOSCOPY;  Service: Endoscopy;  Laterality: N/A;   COLONOSCOPY WITH PROPOFOL  N/A 12/27/2021   Procedure: COLONOSCOPY WITH PROPOFOL ;  Surgeon: Therisa Bi, MD;  Location: [facility] ENDOSCOPY;  Service: Gastroenterology;  Laterality: N/A;   EYE SURGERY Left    bilat laser   HIP SURGERY Right    8 th grade pins   LOWER EXTREMITY ANGIOGRAPHY Right 06/08/2022   Procedure: Lower Extremity Angiography;  Surgeon: Jama Cordella MATSU, MD;  Location: [facility] INVASIVE CV LAB;  Service: Cardiovascular;  Laterality: Right;   NOSE SURGERY     PERIPHERAL VASCULAR CATHETERIZATION Right 10/02/2014   Procedure: Lower Extremity Angiography;  Surgeon: Selinda GORMAN Gu, MD;  Location: [facility] INVASIVE CV LAB;  Service: Cardiovascular;  Laterality: Right;   TOE AMPUTATION Left 2010   2cd   TOE AMPUTATION Right 2009   HPI:  Pt is a 58 y.o. male with medical history significant of morbid Obesity(BMI 57.44, 202.9kg), more Sedentary at home per report, type 2 diabetes, chronic HFpEF,  hypertension and OSA on CPAP came to ED with complaint of left-sided weakness and dysarthria.     Patient went to bed around 10 PM yesterday and was at baseline, this morning when he woke up he  was having left-sided weakness and difficulty speaking so EMS was called.     Patient was also having some shortness of breath and confusion.  He was hypoxic at 75% with EMS.    MRI: Acute cortical and subcortical infarction in the right posterior  frontal lobe. Petechial blood products present in the region without  focal hematoma.  2. 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 Old left parietal cortical and  subcortical infarction. Few scattered old small vessel infarctions  in both hemispheres affecting the white matter.     CXR: Low lung volumes. Cardiomegaly with findings  suggestive of pulmonary  vascular congestion.    Assessment / Plan / Recommendation  Clinical Impression   Pt seen for BSE this PM. NSG reported pt having oral/labial spillage when attempting po's of current diet ordered. Pt awake but drowsy w/ eyes closed often -- reawakened w/ verbal/tactile cues. Uses CPAP at night per Wife/pt -- noted in ED room for use.Ppt able to answer a few basic questions re: self; DOB and place. He followed 1-step commands appropriately. Social greetings WFL. Noted lack of breath support for phrase-sentence level words. Mild+ Dysarthria noted -- some Baseline decreased articulation of speech(?) but Family able to understand pt. Difficult sitting fully upright d/t morbidly obese - MAX support given.  On  O2 support 4L; afebrile. WBC 12.9.   Pt appears to present w/ oral phase dysphagia w/ concern for pharyngeal phase dysphagia in setting of new CVA and overt drowsiness during this session. Pt required cues during session to attend to po tasks. Overt L OM weakness noted also.  Pt appears at increased risk for aspiration/aspiration pneumonia w/ current presentation of sensorimotor deficits noted. With a modified, Dysphagia diet, pt consumed po trials w/ No immediate, overt clinical s/s of aspiration during po trials. It appeared risk for aspiration could be reduced following aspiration precautions w/ the modified food/liquid consistency diet.  Pt exhibits challenging factors that could impact oropharyngeal swallowing to include fatigue/weakness, Positioning/sitting Upright challenges, and new CVA in setting of chronic issues. These factors can increase risk for aspiration, dysphagia as well as decreased oral intake overall.   During po trials, pt consumed modified consistencies w/ no overt coughing, decline in vocal quality, or change in respiratory presentation during/post trials. O2 sats remained in the upper 90s. Oral phase revealed L oral weakness and anterior spillage w/  larger boluses but appeared grossly [facility] w/ bolus management and fairly timely A-P bolus transfer for swallowing when trials were fed to him in Small tsp amounts. Inconsistent decreased bolus control noted when pt exhibited uncoordinated mouth breathing. Oral clearing achieved w/ trial consistencies given.  OM Exam revealed L OM labial/lingual weakness; lingual deviation noted. Posterior/anterior lingual strength WFL. Speech intelligibility reduced for multiple reasons. Pt required full feeding assistance.   Recommend initiation of a Dysphagia level 1(puree) diet w/ nectar liquids via TSP w/ strict aspiration precautions and feeding support when fully awake/alert. Recommend Pills CRUSHED in puree for safer swallowing; 100% Supervision at meals for feeding support as well. Education given on Pills in Puree; food consistencies currently and options; aspiration precautions to pt and Wife. Recommend Dietician f/u for support. ST services will continue to follow w/ ongoing assessment of swallowing and trials to upgrade diet as able, safe. Education given to pt/Wife present; all agreed. NSG and MD updated.  SLP Visit Diagnosis: Dysphagia, oropharyngeal phase (R13.12)    Aspiration Risk  Moderate aspiration risk;Risk for inadequate nutrition/hydration    Diet Recommendation   Nectar;Dysphagia 1 (  puree) = strict aspiration precautions and feeding support when fully awake/alert.   Medication Administration: Crushed with puree    Other  Recommendations Recommended Consults:  (Dietician) Oral Care Recommendations: Oral care BID;Oral care before and after PO;Staff/trained caregiver to provide oral care Caregiver Recommendations: Avoid jello, ice cream, thin soups, popsicles;Remove water pitcher;Have oral suction available    Recommendations for follow up therapy are one component of a multi-disciplinary discharge planning process, led by the attending physician.  Recommendations may be updated based on patient  status, additional functional criteria and insurance authorization.  Follow up Recommendations Follow physician's recommendations for discharge plan and follow up therapies (tbd)      Assistance Recommended at Discharge  FULL  Functional Status Assessment Patient has had a recent decline in their functional status and demonstrates the ability to make significant improvements in function in a reasonable and predictable amount of time.  Frequency and Duration min 2x/week  2 weeks       Prognosis Prognosis for improved oropharyngeal function: Fair (-Good) Barriers to Reach Goals: Time post onset;Severity of deficits Barriers/Prognosis Comment: ability for self-care and feeding; left sided weakness; obesity and Pulmonary decline      Swallow Study   General Date of Onset: 02/14/23 HPI: Pt is a 58 y.o. male with medical history significant of morbid Obesity(BMI 57.44, 202.9kg), more Sedentary at home per report, type 2 diabetes, chronic HFpEF,  hypertension and OSA on CPAP came to ED with complaint of left-sided weakness and dysarthria.     Patient went to bed around 10 PM yesterday and was at baseline, this morning when he woke up he was having left-sided weakness and difficulty speaking so EMS was called.     Patient was also having some shortness of breath and confusion.  He was hypoxic at 75% with EMS.    MRI: Acute cortical and subcortical infarction in the right posterior  frontal lobe. Petechial blood products present in the region without  focal hematoma.  2. 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 Old left parietal cortical and  subcortical infarction. Few scattered old small vessel infarctions  in both hemispheres affecting the white matter.     CXR: Low lung volumes. Cardiomegaly with findings suggestive of pulmonary  vascular congestion. Type of Study: Bedside Swallow Evaluation Previous Swallow Assessment: none Diet Prior to this Study: Regular;Thin liquids (Level 0) (NSG noted oral phase  deficits) Temperature Spikes Noted: No (wbc 12.9) Respiratory Status: Nasal cannula (4L) History of Recent Intubation: No Behavior/Cognition: Alert;Cooperative;Pleasant mood;Distractible;Requires cueing (Drowsy - wears CPAP) Oral Cavity Assessment: Dry (min) Oral Care Completed by SLP: Yes Oral Cavity - Dentition: Missing dentition Vision:  (n/a) Self-Feeding Abilities: Total assist Patient Positioning: Upright in bed (MAX assist) Baseline Vocal Quality: Normal;Low vocal intensity (mumbled speech) Volitional Cough: Strong Volitional Swallow: Able to elicit    Oral/Motor/Sensory Function Overall Oral Motor/Sensory Function: Moderate impairment Facial ROM: Reduced left;Suspected CN VII (facial) dysfunction Facial Symmetry: Abnormal symmetry left;Suspected CN VII (facial) dysfunction Facial Strength: Reduced left;Suspected CN VII (facial) dysfunction Facial Sensation: Reduced left;Suspected CN V (Trigeminal) dysfunction Lingual ROM: Reduced left;Suspected CN XII (hypoglossal) dysfunction Lingual Symmetry: Abnormal symmetry left;Suspected CN XII (hypoglossal) dysfunction Lingual Strength: Suspected CN XII (hypoglossal) dysfunction Velum: Within Functional Limits Mandible: Within Functional Limits   Ice Chips Ice chips: Within functional limits Presentation: Spoon (fed; 6 trials)   Thin Liquid Thin Liquid: Not tested    Nectar Thick Nectar Thick Liquid: Within functional limits Presentation: Spoon (12+ trials)   Honey Thick Honey Thick  Liquid: Not tested   Puree Puree: Within functional limits Presentation: Spoon (fed; 12+ trials)   Solid     Solid: Not tested         Raymond Portugal, MS, CCC-SLP Speech Language Pathologist Rehab Services; [facility] -  [phone] (ascom) Onell Mcmath 02/14/2023,6:16 PM

## 2023-02-14 NOTE — ED Notes (Signed)
 Pt taken to MRI

## 2023-02-14 NOTE — ED Notes (Signed)
 Pt back from MRI

## 2023-02-14 NOTE — ED Triage Notes (Signed)
 LKW 2200 (When family seen pt last) Symptoms noticed by family this AM @ approx 0545 when family noted pt to have symptoms of expressive aphagia, left-sided arm drift, slurred speech. EMS reported initial hypoxia on arrival with room air saturation @ 71%. Pt placed on non-re breather and arrived to ED @ 94% 15L oxygen . Per family report to EMS, pt wears Cpap intermittently when he fells the need.   LVO 3   EMS VS:  BP 132/65 HR 81 O2 RA: 71%   O2 on NRB: 94%  CBG 241

## 2023-02-14 NOTE — Progress Notes (Deleted)
 PHARMACIST - PHYSICIAN COMMUNICATION  CONCERNING:  Enoxaparin  (Lovenox ) for DVT Prophylaxis    RECOMMENDATION: Patient was prescribed enoxaprin 40mg  q24 hours for VTE prophylaxis.   Filed Weights   02/14/23 0730  Weight: (!) 202.9 kg (447 lb 6.4 oz)    Body mass index is 57.44 kg/m.  Estimated Creatinine Clearance: 73 mL/min (A) (by C-G formula based on SCr of 2.01 mg/dL (H)).   Based on [facility] policy patient is candidate for enoxaparin  0.5mg /kg TBW SQ every 24 hours based on BMI being >30.  DESCRIPTION: Pharmacy has adjusted enoxaparin  dose per [facility] policy.  Patient is now receiving enoxaparin  102.5 mg every 24 hours    Lum VEAR Mania, PharmD Clinical Pharmacist  02/14/2023 12:00 PM

## 2023-02-14 NOTE — Consult Note (Signed)
 [facility] Tele[facility]rology Consult Services   Patient Name:   Raymond [name], Raymond [name] Date of Birth:   06/10/1965 Identification Number:   MRN - [id number] Date of Service:   02/14/2023 06:55:16  Diagnosis:       I63.89 - Cerebrovascular accident (CVA) due to other mechanism (HCCC)  Impression:      Pt with a h/o DM, HLD, HTN, CHF, OSA awoke with left sided weakness, SOB, speech difficulty. LKW 10 p.m. last night. Left UE weakness, confusion and dysarthria is present on exam.  No LVO on CTA. CTP nondiagnostic.  DDX includes encephalopathy due to hypoxia/respiratory issues, stroke, peripheral nerve issue, recrudescence prior stroke symptoms.  Recommend medical/cardiac/pulmonary workup and treatment, MRI brain w/o, add plavix  to ASA as outlined below, telemetry, fasting lipids, TTE.    Our recommendations are outlined below.  Recommendations:        Neuro Checks       Bedside Swallow Eval       DVT Prophylaxis       Euglycemia and Avoid Hyperthermia (PRN Acetaminophen )       Bolus with Clopidogrel  300 mg bolus x1 and initiate dual antiplatelet therapy with Aspirin  81 mg daily and Clopidogrel  75 mg daily       Antihypertensives PRN if Blood pressure is greater than 220/120 or there is a concern for End organ damage/contraindications for permissive HTN. If blood pressure is greater than 220/120 give labetalol  PO or IV or Vasotec  IV with a goal of 15% reduction in BP during the first 24 hours.  Sign Out:       Discussed with Emergency Department Provider    ------------------------------------------------------------------------------  Advanced Imaging: CTA Head and Neck Completed.  CTP Completed.  LVO:No  Patient in not a candidate for NIR   Metrics: Last Known Well: 02/12/2022 22:00:00 Dispatch Time: 02/14/2023 06:55:16 Arrival Time: 02/14/2023 06:40:00 Initial Response Time: 02/14/2023 07:04:48 Symptoms: left sided weakness, speech difficulty. Initial  patient interaction: 02/14/2023 07:21:59 NIHSS Assessment Completed: 02/14/2023 07:28:51 Patient is not a candidate for Thrombolytic. Thrombolytic Medical Decision: 02/14/2023 07:28:55 Patient was not deemed candidate for Thrombolytic because of following reasons: LKW outside 4.5 hr window. .  CT head showed no acute hemorrhage or acute core infarct. I personally Reviewed the CT Head and it Showed chronic infarcts  Primary Provider Notified of Diagnostic Impression and Management Plan on: 02/14/2023 07:30:02    ------------------------------------------------------------------------------  History of Present Illness: Patient is a 58 year old Male.  Patient was brought by EMS for symptoms of left sided weakness, speech difficulty. The patient awoke at 05:45 with SOB, left arm weakness and slurred speech. With EMS O2 sat was 75%. He was noted to have difficulty speaking in the ER. He has a h/o DM, HLD, HTN, CHF, OSA. He has ongoing SOB and confusion, left arm weakness in the ER however is able to speak and name items.    Past Medical History:      Hypertension      Diabetes Mellitus      Hyperlipidemia  Medications:  No Anticoagulant use  Antiplatelet use: Yes asa 81 Reviewed EMR for current medications  Allergies:  Reviewed  Social History: Smoking: No Alcohol Use: No Drug Use: No  Family History:  Family History Cannot Be Obtained Because:Patient Is Confused  ROS : ROS Cannot Be Obtained Because:  Patient Is Confused  Past Surgical History: There Is No Surgical History Contributory To Today's Visit     Examination: BP(132/65), Pulse(81), Blood Glucose(127) 1A: Level of Consciousness -  Arouses to minor stimulation + 1 1B: Ask Month and Age - Could Not Answer Either Question Correctly + 2 1C: Blink Eyes & Squeeze Hands - Performs Both Tasks + 0 2: Test Horizontal Extraocular Movements - Normal + 0 3: Test Visual Fields - No Visual Loss + 0 4: Test Facial  Palsy (Use Grimace if Obtunded) - Normal symmetry + 0 5A: Test Left Arm Motor Drift - Drift, hits bed + 2 5B: Test Right Arm Motor Drift - No Drift for 10 Seconds + 0 6A: Test Left Leg Motor Drift - No Drift for 5 Seconds + 0 6B: Test Right Leg Motor Drift - No Drift for 5 Seconds + 0 7: Test Limb Ataxia (FNF/Heel-Shin) - No Ataxia + 0 8: Test Sensation - Normal; No sensory loss + 0 9: Test Language/Aphasia - Normal; No aphasia + 0 10: Test Dysarthria - Mild-Moderate Dysarthria: Slurring but can be understood + 1 11: Test Extinction/Inattention - No abnormality + 0  NIHSS Score: 6   Pre-Morbid Modified Rankin Scale: 0 Points = No symptoms at all  Spoke with : [name], MD  This consult was conducted in real time using interactive audio and immunologist. Patient was informed of the technology being used for this visit and agreed to proceed. Patient located in hospital and provider located at home/office setting.   Patient is being evaluated for possible acute neurologic impairment and high probability of imminent or life-threatening deterioration. I spent total of 41 minutes providing care to this patient, including time for face to face visit via telemedicine, review of medical records, imaging studies and discussion of findings with providers, the patient and/or family.   Dr Tommi Gay   [facility] For Inpatient follow-up with [facility] physician please call [facility] at [phone]. As we are not an outpatient service for any post hospital discharge needs please contact the hospital for assistance. If you have any questions for the [facility] physicians or need to reconsult for clinical or diagnostic changes please contact us  via [facility] at [phone].

## 2023-02-14 NOTE — ED Notes (Signed)
 36mL showing in bladder

## 2023-02-14 NOTE — ED Notes (Signed)
 Pt moved onto hospital bed and clean chux placed. Pt placed in clean gown and new periwick placed. Pt given warm blankets and repositioned.

## 2023-02-14 NOTE — ED Notes (Addendum)
 Admit MD at bedside and informed of  CBG. New plan for pt off bipap and [place]. Pt then to have SLP evalu and MRI.  Pt placed on 3L [place] by RT

## 2023-02-14 NOTE — ED Notes (Signed)
 Pt was able to eat almost whole container of apple sauce

## 2023-02-14 NOTE — ED Notes (Signed)
 Attempted bedside swallow screen per Admit MD order. Pt able to drink water but it continues to come out of left sided of mouth. Pt still not able to get any words out. Orders already placed for SLP evaluation.

## 2023-02-14 NOTE — ED Provider Notes (Signed)
 [facility] Provider Note    Event Date/Time   First MD Initiated Contact with Patient 02/14/23 [phone]     (approximate)   History   Code Stroke   HPI  Raymond [name] is a 58 year old male with history of CHF, T2DM, OSA, HTN presenting to the emergency department for evaluation of aphasia and left-sided weakness as a code stroke.  Per EMS report, patient was last seen well last night around 10 PM.  This morning at approximately 5:45 AM, family noticed that patient was having expressive aphasia with left-sided arm drift and slurred speech.  He was also hypoxic for EMS with a room air saturation of 71% for which she was placed on a nonrebreather.  Patient with limited vocalization, not able provide significant history on my evaluation.  I did review patient's discharge summary from 09/12/2022.  At that time, patient presented with worsening shortness of breath and chest tightness found to have acute CHF exacerbation for which he was diuresed      Physical Exam   Triage Vital Signs: ED Triage Vitals  Encounter Vitals Group     BP      Systolic BP Percentile      Diastolic BP Percentile      Pulse      Resp      Temp      Temp src      SpO2      Weight      Height      Head Circumference      Peak Flow      Pain Score      Pain Loc      Pain Education      Exclude from Growth Chart     Most recent vital signs: Vitals:   02/14/23 1100 02/14/23 1115  BP: 134/67   Pulse: 62 61  Resp: (!) 21 20  Temp:    SpO2: 93% 92%     General: Awake, interactive  CV:  Regular rate, bilateral lower extremity edema Resp:  Somewhat labored respirations, on nonrebreather mask Abd:  Nondistended, nontender Neuro:  Keenly aware, unable to answer month or age, following basic commands, normal horizontal extraocular movements, no visual field loss, left-sided facial droop present, rapid drift of left arm hits the bed, right arm without significant  drift, able to hold bilateral lower extremities antigravity for a few seconds, not able to maintain for 5 seconds.  Was able to name a chair, but otherwise unable to name objects, limited vocalizations are difficult to assess dysarthria.  No inattention    ED Results / Procedures / Treatments   Labs (all labs ordered are listed, but only abnormal results are displayed) Labs Reviewed  CBC - Abnormal; Notable for the following components:      Result Value   WBC 12.9 (*)    Hemoglobin 10.0 (*)    HCT 36.9 (*)    MCV 78.8 (*)    MCH 21.4 (*)    MCHC 27.1 (*)    RDW 18.6 (*)    nRBC 1.1 (*)    All other components within normal limits  DIFFERENTIAL - Abnormal; Notable for the following components:   Monocytes Absolute 1.4 (*)    All other components within normal limits  COMPREHENSIVE METABOLIC PANEL - Abnormal; Notable for the following components:   BUN 30 (*)    Creatinine, Ser 2.01 (*)    Calcium  8.1 (*)    Albumin  3.3 (*)  GFR, Estimated 38 (*)    All other components within normal limits  BRAIN NATRIURETIC PEPTIDE - Abnormal; Notable for the following components:   B Natriuretic Peptide 231.3 (*)    All other components within normal limits  BLOOD GAS, VENOUS - Abnormal; Notable for the following components:   pH, Ven 7.23 (*)    pCO2, Ven 79 (*)    pO2, Ven 55 (*)    Bicarbonate 33.1 (*)    Acid-Base Excess 3.0 (*)    All other components within normal limits  BLOOD GAS, VENOUS - Abnormal; Notable for the following components:   pH, Ven 7.23 (*)    pCO2, Ven 84 (*)    pO2, Ven 46 (*)    Bicarbonate 35.2 (*)    Acid-Base Excess 4.7 (*)    All other components within normal limits  BLOOD GAS, VENOUS - Abnormal; Notable for the following components:   pCO2, Ven 78 (*)    Bicarbonate 34.2 (*)    Acid-Base Excess 5.2 (*)    All other components within normal limits  CBG MONITORING, ED - Abnormal; Notable for the following components:   Glucose-Capillary 127 (*)     All other components within normal limits  PROTIME-INR  APTT  ETHANOL  CBG MONITORING, ED     EKG EKG independently reviewed interpreted by myself (ER attending) demonstrates:  EKG demonstrates sinus rhythm at a rate of 72, PR 158, QRS 111, QTc 425, no acute ST changes  RADIOLOGY Imaging independently reviewed and interpreted by myself demonstrates:  CT head without acute bleed, radiology notes multiple areas of prior infarct previously visualized on MRI from 2013 CTA without evidence of LVO Chest x-Elihu Milstein concerning for pulmonary edema my review  PROCEDURES:  Critical Care performed: Yes, see critical care procedure note(s)  CRITICAL CARE Performed by: Nilsa Dade   Total critical care time: 40 minutes  Critical care time was exclusive of separately billable procedures and treating other patients.  Critical care was necessary to treat or prevent imminent or life-threatening deterioration.  Critical care was time spent personally by me on the following activities: development of treatment plan with patient and/or surrogate as well as nursing, discussions with consultants, evaluation of patient's response to treatment, examination of patient, obtaining history from patient or surrogate, ordering and performing treatments and interventions, ordering and review of laboratory studies, ordering and review of radiographic studies, pulse oximetry and re-evaluation of patient's condition.   Procedures   MEDICATIONS ORDERED IN ED: Medications  iohexol  (OMNIPAQUE ) 350 MG/ML injection 75 mL (75 mLs Intravenous Contrast Given 02/14/23 0659)  iohexol  (OMNIPAQUE ) 350 MG/ML injection 40 mL (40 mLs Intravenous Contrast Given 02/14/23 0716)  furosemide  (LASIX ) injection 60 mg (60 mg Intravenous Given 02/14/23 0818)     IMPRESSION / MDM / ASSESSMENT AND PLAN / ED COURSE  I reviewed the triage vital signs and the nursing notes.  Differential diagnosis includes, but is not limited to, acute  intracranial bleed, acute CVA with consideration for large vessel occlusion, recrudescence of prior stroke, hypercarbia resulting in acute encephalopathy, anemia, electrolyte abnormality  Patient's presentation is most consistent with acute presentation with potential threat to life or bodily function.  58 year old male presenting with aphasia and left-sided weakness.  He presents outside the thrombolytic window, but he is VAN positive, so consideration for possible LVO. Labs with mild leukocytosis WBC of 12.9, anemia with hemoglobin of 10, slightly decreased from prior.  CMP with AKI with creatinine of 2, baseline appears around 1.  CT imaging performed, resulted without evidence of LVO.  Patient was evaluated by Dr. Ashley with teleneurology.  She agreed that imaging without evidence of LVO, no indication for thrombectomy/transfer.  Did recommend further medical workup specifically regarding hypoxia, respiratory concerns, but ultimate admission with Plavix  load and continued dual antiplatelet therapy.  Will plan to transition patient from nonrebreather to BiPAP, obtain blood gas to further evaluate.  CXR concerning for pulmonary edema on my review, ordered for 60 of IV Lasix .  Ultimate plan for admission.  Clinical Course as of 02/14/23 1144  Tue Feb 14, 2023  0929 Blood gas, venous(!!) Repeat gas unfortunately with stable pH, worsening pCO2.  Discussed with respiratory therapy, will adjust BiPAP settings to see if this can be optimized and plan for repeat VBG after this. [NR]  1125 Blood gas, venous(!!) Repeat gas currently improved.  Discussed with respiratory therapist, BiPAP settings adjusted to 20/8 to further facilitate improvement.  Do think it is reasonable to keep patient on BiPAP and admit at this time.  Will reach out to hospitalist team. [NR]  1142 Case reviewed with Dr. Caleen.  She will evaluate the patient for anticipated admission. [NR]    Clinical Course User Index [NR] [name],  MD     FINAL CLINICAL IMPRESSION(S) / ED DIAGNOSES   Final diagnoses:  Left-sided weakness  Expressive aphasia  Acute respiratory failure with hypoxia and hypercapnia (HCC)     Rx / DC Orders   ED Discharge Orders     None        Note:  This document was prepared using Dragon voice recognition software and may include unintentional dictation errors.   [name], MD 02/14/23 [phone]

## 2023-02-14 NOTE — Assessment & Plan Note (Deleted)
Estimated body mass index is 62.55 kg/m as calculated from the following:   Height as of this encounter: 6\' 2"  (1.88 m).   Weight as of this encounter: 221 kg.   -This will complicate overall prognosis -Encouraged weight loss

## 2023-02-14 NOTE — Assessment & Plan Note (Addendum)
 On admission, Found to have hypoxia and hypercarbia.  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  Patient was using CPAP at home, not sure about the compliance.  He was placed on BiPAP in ED. chest x-ray with concern of pulmonary vascular congestion. uses 2 L of oxygen  at baseline -Use BiPAP at night and while taking a nap. -Continue with supplemental oxygen -keep the saturation above 90% -Incentive spirometry 02-17-2023 continue with home trilogy at night. Now that his family has brought it to hospital.

## 2023-02-14 NOTE — Assessment & Plan Note (Addendum)
 Creatinine 2.01 on presentation.  Creatinine 1.24 on 1/18.  Creatinine with some fluctuation, currently at 1.57 -Monitor renal function -Avoid nephrotoxins

## 2023-02-14 NOTE — Progress Notes (Signed)
 0645-Code stroke activated. Pt in CT. LKWT 2200 last night. Aphasia, left sided weakness and facial droop.  0705-Dr. Ashley joined tele-neuro cart for exam  0721-pt taken back to room in CT. Dr. Ashley completed her assessment. No TNK due to LKWT > 4.5 hours, no LVO on CTA.

## 2023-02-14 NOTE — Assessment & Plan Note (Addendum)
EF of 55%, patient on Coreg 25 mg twice a day, Imdur 60 mg daily. -Restarting home torsemide-dose was increased to 40 mg daily

## 2023-02-14 NOTE — ED Notes (Signed)
 Pt back in room and placed on monitor. Neurologist performing exam via monitor. Dr. Sheilah Pigeon at bedside.

## 2023-02-14 NOTE — Assessment & Plan Note (Addendum)
Hemoglobin A1c 6.7.  CBG remained  elevated. -Increase Semglee to 35 units twice daily-patient takes 60 units at home -Increase mealtime coverage to 8 units -Continue with SSI

## 2023-02-14 NOTE — Assessment & Plan Note (Addendum)
 On admission, Presented with left-sided weakness and some dysarthria when he woke up this morning, LKW around 10 PM last night, out of window for thrombolytic.  Initial CT head was negative for any acute abnormality, CTA negative for LVO.  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 Petechial blood products present in the region without focal hematoma. Pt started on DAPT.  Aspirin  for 21 days total with Plavix  then Plavix  monotherapy after that. Physical therapy recommending rehab.  As patient does not want to go out of [place], now agreed to go home with home health Slowly improving left-sided weakness

## 2023-02-14 NOTE — Assessment & Plan Note (Addendum)
 Continue Coreg and Imdur Adding home torsemide

## 2023-02-15 ENCOUNTER — Inpatient Hospital Stay
Admit: 2023-02-15 | Discharge: 2023-02-15 | Disposition: A | Discharge status: Home / Self Care | Payer: Medicare HMO | Attending: Internal Medicine

## 2023-02-15 DIAGNOSIS — N179 Acute kidney failure, unspecified: Secondary | ICD-10-CM | POA: Diagnosis not present

## 2023-02-15 DIAGNOSIS — I639 Cerebral infarction, unspecified: Secondary | ICD-10-CM | POA: Diagnosis not present

## 2023-02-15 DIAGNOSIS — I69952 Hemiplegia and hemiparesis following unspecified cerebrovascular disease affecting left dominant side: Secondary | ICD-10-CM

## 2023-02-15 DIAGNOSIS — I5033 Acute on chronic diastolic (congestive) heart failure: Secondary | ICD-10-CM | POA: Diagnosis not present

## 2023-02-15 DIAGNOSIS — R471 Dysarthria and anarthria: Secondary | ICD-10-CM

## 2023-02-15 DIAGNOSIS — R1313 Dysphagia, pharyngeal phase: Secondary | ICD-10-CM

## 2023-02-15 LAB — CBG MONITORING, ED
Glucose-Capillary: 42 mg/dL — CL (ref 70–99)
Glucose-Capillary: 69 mg/dL — ABNORMAL LOW (ref 70–99)
Glucose-Capillary: 65 mg/dL — ABNORMAL LOW (ref 70–99)

## 2023-02-15 LAB — LIPID PANEL
Cholesterol: 54 mg/dL (ref 0–200)
HDL: 38 mg/dL — ABNORMAL LOW (ref >40)
LDL Cholesterol: 12 mg/dL (ref 0–99)
Total CHOL/HDL Ratio: 1.4 {ratio}
Triglycerides: 20 mg/dL (ref <150)
VLDL: 4 mg/dL (ref 0–40)

## 2023-02-15 LAB — ECHOCARDIOGRAM COMPLETE
AR max vel: 2.57 cm2
AV Area VTI: 3.28 cm2
AV Area mean vel: 3.05 cm2
AV Mean grad: 3 mm[Hg]
AV Peak grad: 7.8 mm[Hg]
Ao pk vel: 1.4 m/s
Area-P 1/2: 4.1 cm2
Height: 74 in
MV VTI: 3.16 cm2
S' Lateral: 4.3 cm
Weight: 7158.4 [oz_av]

## 2023-02-15 LAB — BASIC METABOLIC PANEL
Anion gap: 11 (ref 5–15)
BUN: 27 mg/dL — ABNORMAL HIGH (ref 6–20)
CO2: 33 mmol/L — ABNORMAL HIGH (ref 22–32)
Calcium: 8.1 mg/dL — ABNORMAL LOW (ref 8.9–10.3)
Chloride: 101 mmol/L (ref 98–111)
Creatinine, Ser: 1.53 mg/dL — ABNORMAL HIGH (ref 0.61–1.24)
GFR, Estimated: 52 mL/min — ABNORMAL LOW (ref >60)
Glucose, Bld: 52 mg/dL — ABNORMAL LOW (ref 70–99)
Potassium: 4.3 mmol/L (ref 3.5–5.1)
Sodium: 145 mmol/L (ref 135–145)

## 2023-02-15 LAB — CBC
HCT: 37.3 % — ABNORMAL LOW (ref 39.0–52.0)
Hemoglobin: 9.8 g/dL — ABNORMAL LOW (ref 13.0–17.0)
MCH: 21.3 pg — ABNORMAL LOW (ref 26.0–34.0)
MCHC: 26.3 g/dL — ABNORMAL LOW (ref 30.0–36.0)
MCV: 81.1 fL (ref 80.0–100.0)
Platelets: 259 10*3/uL (ref 150–400)
RBC: 4.6 MIL/uL (ref 4.22–5.81)
RDW: 18.3 % — ABNORMAL HIGH (ref 11.5–15.5)
WBC: 10.4 10*3/uL (ref 4.0–10.5)
nRBC: 0.6 % — ABNORMAL HIGH (ref 0.0–0.2)

## 2023-02-15 LAB — GLUCOSE, CAPILLARY
Glucose-Capillary: 82 mg/dL (ref 70–99)
Glucose-Capillary: 52 mg/dL — ABNORMAL LOW (ref 70–99)
Glucose-Capillary: 74 mg/dL (ref 70–99)
Glucose-Capillary: 206 mg/dL — ABNORMAL HIGH (ref 70–99)
Glucose-Capillary: 170 mg/dL — ABNORMAL HIGH (ref 70–99)

## 2023-02-15 MED ORDER — PERFLUTREN LIPID MICROSPHERE
1.0000 mL | INTRAVENOUS | Status: AC | PRN
  Administered 2023-02-15: 7 mL via INTRAVENOUS

## 2023-02-15 MED ORDER — DEXTROSE 50 % IV SOLN
12.5000 g | INTRAVENOUS | Status: AC
  Administered 2023-02-15: 12.5 g via INTRAVENOUS
  Filled 2023-02-15: qty 50

## 2023-02-15 MED ORDER — DEXTROSE 50 % IV SOLN
INTRAVENOUS | Status: AC
  Administered 2023-02-15: 25 g via INTRAVENOUS
  Filled 2023-02-15: qty 50

## 2023-02-15 MED ORDER — CHLORHEXIDINE GLUCONATE CLOTH 2 % EX PADS
6.0000 | MEDICATED_PAD | Freq: Every day | CUTANEOUS | Status: DC
  Administered 2023-02-15 – 2023-02-20 (×5): 6 via TOPICAL

## 2023-02-15 MED ORDER — HEPARIN SODIUM (PORCINE) 5000 UNIT/ML IJ SOLN
5000.0000 [IU] | Freq: Three times a day (TID) | INTRAMUSCULAR | Status: DC
  Administered 2023-02-15 – 2023-02-27 (×37): 5000 [IU] via SUBCUTANEOUS
  Filled 2023-02-15 (×37): qty 1

## 2023-02-15 MED ORDER — DEXTROSE 50 % IV SOLN: 25.0000 g | INTRAVENOUS | Status: AC

## 2023-02-15 NOTE — Assessment & Plan Note (Addendum)
Advance to solid food on 1/18

## 2023-02-15 NOTE — Assessment & Plan Note (Addendum)
 Advance to solid food on 1/18

## 2023-02-15 NOTE — Progress Notes (Addendum)
 NEUROLOGY CONSULT FOLLOW UP NOTE   Date of service: February 15, 2023 Patient Name: Raymond [name] [facility] MRN:  [id number] DOB:  [birth year]   Brief HPI   This is a 58 year old man with past medical history significant for morbid obesity, type 2 diabetes, chronic heart failure with preserved ejection fraction, hypertension, OSA on CPAP who presented to the ED in the early morning yesterday complaining of left-sided weakness and dysarthria.  Last known well was 10 PM the day before and he was at baseline, when he awoke yesterday morning he was having left-sided weakness and difficulty speaking so EMS was called.  He was also noted in the ED to have his shortness of breath and confusion and was hypoxic at 75% with EMS.  Code stroke was activated and teleneurology was consulted.  TNK was not administered due to the fact that he was outside the window for thrombolytics.  There was no large vessel occlusion on CT angiogram.  His NIH stroke scale on teleneurology exam was 6 (1 for level of consciousness, 2 for questions, 2 for left arm drift, 1 for dysarthria).  Interval Hx/subjective   Personal review of MRI brain showed acute cortical and subcortical infarcts in the right posterior frontal lobe.  Petechial blood products are present in the region without focal hematoma.  He is also noted to have remote infarcts in the right cerebellum, left parietal cortical region, and left parietal subcortical region as well as a few scattered remote small vessel infarcts in both hemispheres affecting white matter.  Patient reports that he is not aware of having any strokes in the past. He is bedbound at baseline.  Vitals   Vitals:   02/15/23 0603 02/15/23 0700 02/15/23 0745 02/15/23 0800  BP:  (!) 141/54 (!) 148/51 (!) 157/59  Pulse:  65 64 71  Resp:  16 (!) 22 (!) 22  Temp: 99.8 F (37.7 C)   98.8 F (37.1 C)  TempSrc: Axillary   Oral  SpO2:  96% 96% 92%  Weight:      Height:         Body mass  index is 57.44 kg/m.  Physical Exam   Constitutional: Appears well-developed and well-nourished.  Psych: Affect appropriate to situation.  Eyes: No scleral injection.  HENT: No OP obstrucion.  Head: Normocephalic.  Cardiovascular: Normal rate and regular rhythm.  Respiratory: Effort normal, non-labored breathing.  GI: Soft.  No distension. There is no tenderness.  Skin: WDI.   Neurologic Examination   Exam  Vitals:   02/15/23 1511 02/15/23 1600  BP: (!) 143/64   Pulse: 72   Resp: (!) 24 (!) 22  Temp: 98.3 F (36.8 C)   SpO2: 100%     Gen: patient lying in bed, NAD CV: extremities appear well-perfused Resp: normal WOB  Neurologic exam MS: alert, oriented x4, follows commands Speech: moderate dysarthria, no aphasia, able to name and repeat. Speaks slowly with some delay in answering questions. CN: PERRL, VFF, EOMI, sensation intact, L facial droop, hearing intact to voice Motor: LUE moderate drift, RUE no drift. L grip significantly weaker than R. Drift to bed BLE, patient states is chronic. Sensory: SILT Reflexes: 1+ symm with toes down bilat Coordination: FNF intact on R Gait: patient is bedbound at baseline   Medications  Current Facility-Administered Medications:    acetaminophen  (TYLENOL ) tablet 650 mg, 650 mg, Oral, Q6H PRN **OR** acetaminophen  (TYLENOL ) suppository 650 mg, 650 mg, Rectal, Q6H PRN, Amin, Sumayya, MD   aspirin  EC tablet 81  mg, 81 mg, Oral, Daily, Amin, Sumayya, MD, 81 mg at 02/15/23 0954   atorvastatin  (LIPITOR) tablet 40 mg, 40 mg, Oral, Daily, Amin, Sumayya, MD, 40 mg at 02/15/23 0954   Chlorhexidine  Gluconate Cloth 2 % PADS 6 each, 6 each, Topical, Daily, Unk Garb, DO, 6 each at 02/15/23 1010   clopidogrel  (PLAVIX ) tablet 75 mg, 75 mg, Oral, Daily, Amin, Sumayya, MD, 75 mg at 02/15/23 0954   Fe Fum-Vit C-Vit B12-FA (TRIGELS-F FORTE) capsule 1 capsule, 1 capsule, Oral, BID, Amin, Sumayya, MD   furosemide  (LASIX ) injection 40 mg, 40 mg,  Intravenous, BID, Amin, Sumayya, MD, 40 mg at 02/15/23 2841   insulin  aspart (novoLOG ) injection 0-20 Units, 0-20 Units, Subcutaneous, TID WC, Amin, Sumayya, MD   insulin  aspart (novoLOG ) injection 0-5 Units, 0-5 Units, Subcutaneous, QHS, Amin, Sumayya, MD   labetalol  (NORMODYNE ) injection 10 mg, 10 mg, Intravenous, Q2H PRN, Luna Salinas, MD   ondansetron  (ZOFRAN ) tablet 4 mg, 4 mg, Oral, Q6H PRN **OR** ondansetron  (ZOFRAN ) injection 4 mg, 4 mg, Intravenous, Q6H PRN, Amin, Sumayya, MD   polyethylene glycol (MIRALAX  / GLYCOLAX ) packet 17 g, 17 g, Oral, Daily PRN, Luna Salinas, MD  Labs and Diagnostic Imaging   CBC:  Recent Labs  Lab 02/14/23 0650 02/15/23 0336  WBC 12.9* 10.4  NEUTROABS 7.7  --   HGB 10.0* 9.8*  HCT 36.9* 37.3*  MCV 78.8* 81.1  PLT 290 259    Basic Metabolic Panel:  Lab Results  Component Value Date   NA 145 02/15/2023   K 4.3 02/15/2023   CO2 33 (H) 02/15/2023   GLUCOSE 52 (L) 02/15/2023   BUN 27 (H) 02/15/2023   CREATININE 1.53 (H) 02/15/2023   CALCIUM  8.1 (L) 02/15/2023   GFRNONAA 52 (L) 02/15/2023   GFRAA >60 04/30/2017   Lipid Panel:  Lab Results  Component Value Date   LDLCALC 12 02/15/2023   HgbA1c:  Lab Results  Component Value Date   HGBA1C 6.7 (H) 02/14/2023   Urine Drug Screen:     Component Value Date/Time   LABOPIA NONE DETECTED 02/14/2023 1620   LABOPIA NONE DETECTED 04/28/2017 1230   COCAINSCRNUR NONE DETECTED 02/14/2023 1620   LABBENZ NONE DETECTED 02/14/2023 1620   LABBENZ NONE DETECTED 04/28/2017 1230   AMPHETMU NONE DETECTED 02/14/2023 1620   AMPHETMU NONE DETECTED 04/28/2017 1230   THCU NONE DETECTED 02/14/2023 1620   THCU NONE DETECTED 04/28/2017 1230   LABBARB NONE DETECTED 02/14/2023 1620   LABBARB NONE DETECTED 04/28/2017 1230    Alcohol Level     Component Value Date/Time   ETH <10 02/14/2023 0650   INR  Lab Results  Component Value Date   INR 1.1 02/14/2023   APTT  Lab Results  Component Value Date    APTT 30 02/14/2023   AED levels: No results found for: "PHENYTOIN", "ZONISAMIDE", "LAMOTRIGINE", "LEVETIRACETA"  CT angio Head and Neck with contrast(Personally reviewed):  CTA:   1. No emergent finding. 2. Minimal calcified atherosclerosis. 3. Suboptimal bolus density and soft tissue attenuation limits visualization of arteries in the neck, especially the proximal vertebral arteries.   CT perfusion:   Noncontributory due to artifact.  MRI Brain(Personally reviewed): 1. Acute cortical and subcortical infarction in the right posterior frontal lobe. Petechial blood products present in the region without focal hematoma. 2. 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 Few scattered old small vessel infarctions in both hemispheres affecting the white matter.  TTE 1. Technically difficult study. Definity  used  to optimize the study.   2. Left ventricular ejection fraction, by estimation, is 55 to 60%. The  left ventricle has normal function. The left ventricle has no regional  wall motion abnormalities. There is moderate concentric left ventricular  hypertrophy. Left ventricular  diastolic parameters are indeterminate.   3. Right ventricular systolic function was not well visualized. The right  ventricular size is not well visualized. There is normal pulmonary artery  systolic pressure.   4. The mitral valve is normal in structure. Trivial mitral valve  regurgitation.   5. The aortic valve is tricuspid. Aortic valve regurgitation is not  visualized.   Assessment   Raymond [name] is a 58 y.o. male with past medical history significant for morbid obesity, type 2 diabetes, chronic heart failure with preserved ejection fraction, hypertension, OSA on CPAP who presented with L sided weakness and dysarthria 2/2 R posterior frontal ischemic infarct. MRI shows small amt of petechial hemorrhage with no frank hematoma. He has numerous  cerebrovascular risk factors (obesity, DM2, HTN, OSA) and has had numerous strokes in the past based on imaging although patient was unaware of this. Given the cortical location of part of his acute stroke and the burden of remote infarcts in addition to the current acute infarct cardioembolic source is a possibility. Given his weight, poor functional status, HFpEF, orthopnea, and hypoxia on admission I don't feel he is a good candidate for TEE. However I would recommend cardiac monitoring after discharge to evaluate for possible paroxysmal a fib. Otherwise, stroke workup is completed.  Recommendations   - Goal normotension, avoid hypotension - OK to continue DAPT and start pharmacologic DVT prophylaxis, amt of petechial hemorrhage on MRI is v small - ASA 81mg  daily + plavix  75mg  daily x21 days f/b plavix  75mg  daily monotherapy after that - Continue atorvastatin  40mg  daily - q4 hr neuro checks - STAT head CT for any change in neuro exam - Tele - PT/OT/SLP - Stroke education - I will arrange outpatient neurology f/u - Please arrange outpatient ambulatory cardiac monitoring after discharge 2/2 high burden of remote infarcts in addition to current acute ischemic infarct  Neurology will be available prn for questions going forward  ______________________________________________________________________   Signed, Eleni Griffin, MD [facility]

## 2023-02-15 NOTE — Progress Notes (Signed)
CBG 82 

## 2023-02-15 NOTE — Evaluation (Signed)
 Occupational Therapy Evaluation Patient Details Name: Raymond [name] MRN: [id number] DOB: Oct 27, 1965 Today's Date: 02/15/2023   History of Present Illness Pt is a 58 year old male presented to the ED with L sided weakness and dysarthria, MRI showing  Acute cortical and subcortical infarction in the right posterior  frontal lobe. 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 Few scattered old small vessel infarctions  in both hemispheres affecting the white matter.    PMH significant for morbid obesity, type 2 diabetes, chronic HFpEF,  hypertension and OSA on CPAP   Clinical Impression   Chart reviewed, pt greeted in bed, agreeable to OT evaluation. Pt is oriented to self and place, dysarthria noted throughout. PTA pt reports he was performing ADL/IADl with MOD I however daughter confirms decreased overall performance in ADL/mobility the last month including pt moving to a ground level apartment (used to be upstairs) with his good friend who he lives with. Pt presents with deficits in strength, endurance, activity tolerance, balance, LUE function, cognition, affecting safe and optimal ADL completion. MAX A +1-2 required for bed mobility, poor sustained sitting balance on edge of bed with pt fatigued after no more than 5 minutes. MAX A for LB dressing, MOD A for feeding, provided red built up. Pt will benefit from acute OT to address deficits and to facilitate optimal ADL performance. OT will continue to follow acutely.     If plan is discharge home, recommend the following: Two people to help with walking and/or transfers;Two people to help with bathing/dressing/bathroom;Assistance with cooking/housework;Assist for transportation;Help with stairs or ramp for entrance;Assistance with feeding    Functional Status Assessment  Patient has had a recent decline in their functional  status and demonstrates the ability to make significant improvements in function in a reasonable and predictable amount of time.  Equipment Recommendations  Other (comment) (defer to next venue of care)    Recommendations for Other Services       Precautions / Restrictions Precautions Precautions: Fall      Mobility Bed Mobility Overal bed mobility: Needs Assistance Bed Mobility: Supine to Sit, Sit to Supine     Supine to sit: Max assist, HOB elevated Sit to supine: Max assist, HOB elevated, +2 for physical assistance   General bed mobility comments: MAX A +2 for boost up bed    Transfers                   General transfer comment: unsafe to attempt on this date      Balance Overall balance assessment: Needs assistance Sitting-balance support: Bilateral upper extremity supported Sitting balance-Leahy Scale: Fair Sitting balance - Comments: at least MIN A for static sitting balance, pt SOB sitting on edge of bed, limited to approx 5 minutes                                   ADL either performed or assessed with clinical judgement   ADL Overall ADL's : Needs assistance/impaired Eating/Feeding: Moderate assistance;Bed level Eating/Feeding Details (indicate cue type and reason): sitting up in bed, simulated with LUE Grooming: Wash/dry face;Moderate assistance   Upper Body Bathing: Maximal assistance Upper Body Bathing Details (indicate cue type and reason): anticipate Lower Body Bathing: Maximal assistance Lower Body Bathing Details (indicate cue type and reason): anticipate Upper Body Dressing : Moderate assistance Upper Body Dressing  Details (indicate cue type and reason): anticipate Lower Body Dressing: Maximal assistance Lower Body Dressing Details (indicate cue type and reason): to doff socks     Toileting- Clothing Manipulation and Hygiene: Maximal assistance;Total assistance;Bed level               Vision Patient Visual Report:  No change from baseline Vision Assessment?: Yes Eye Alignment: Within Functional Limits Ocular Range of Motion: Within Functional Limits Alignment/Gaze Preference: Within Defined Limits Tracking/Visual Pursuits: Able to track stimulus in all quads without difficulty Saccades: Within functional limits Visual Fields: No apparent deficits (will continue to assess)     Perception Perception:  (will continue to assess)       Praxis Praxis: Impaired Praxis Impairment Details: Motor planning     Pertinent Vitals/Pain Pain Assessment Pain Assessment: No/denies pain     Extremity/Trunk Assessment Upper Extremity Assessment Upper Extremity Assessment: Left hand dominant;LUE deficits/detail LUE Deficits / Details: LUE shoulder AROM limited to approx 3/4 full AROM; elbow/wrist/hand appear WFL, will continue to assess; mildly weak grip strength, impaired FMC/coordination LUE: Shoulder pain with ROM LUE Sensation:  (will continue to assess) LUE Coordination: decreased fine motor;decreased gross motor   Lower Extremity Assessment Lower Extremity Assessment: Generalized weakness       Communication Communication Communication: Difficulty communicating thoughts/reduced clarity of speech (dysarthria) Cueing Techniques: Verbal cues;Tactile cues;Visual cues   Cognition Arousal: Alert Behavior During Therapy: Flat affect Overall Cognitive Status: Impaired/Different from baseline Area of Impairment: Attention, Following commands, Safety/judgement, Awareness                   Current Attention Level: Sustained   Following Commands: Follows one step commands with increased time Safety/Judgement: Decreased awareness of safety, Decreased awareness of deficits Awareness: Intellectual         General Comments  spo2 to 84% on 4 L via [place] after sitting on edge of bed, >90% on 4 L via [place] after return to supine    Exercises Other Exercises Other Exercises: edu re: role of OT, role of  rehab, discharge recommendations, LUE use in ADL, provided build up utensil   Shoulder Instructions      Home Living Family/patient expects to be discharged to:: Private residence Living Arrangements: Spouse/significant other Available Help at Discharge: Family;Available 24 hours/day;Friend(s) (lives with a very good friend) Type of Home: Apartment Home Access: Level entry     Home Layout: One level     Bathroom Shower/Tub: Tub/shower unit                    Prior Functioning/Environment               Mobility Comments: Pt daughter reports decrease in overall funtion over the last month, amb around the house no AD ADLs Comments: Pt reports he was MOD I with ADL, assist with IADL (does not drive) but finger nails are extremely long, has a long beard indicating potential difficulities with ADLs        OT Problem List: Decreased strength;Decreased range of motion;Decreased activity tolerance;Decreased coordination;Impaired balance (sitting and/or standing);Impaired vision/perception;Decreased cognition;Decreased knowledge of precautions;Decreased safety awareness;Decreased knowledge of use of DME or AE;Cardiopulmonary status limiting activity;Impaired UE functional use      OT Treatment/Interventions: Self-care/ADL training;Visual/perceptual remediation/compensation;Manual therapy;Therapeutic exercise;Patient/family education;Modalities;Neuromuscular education;Splinting;Balance training;Therapeutic activities;Energy conservation;DME and/or AE instruction;Cognitive remediation/compensation    OT Goals(Current goals can be found in the care plan section) Acute Rehab OT Goals Patient Stated Goal: improve function OT Goal Formulation: With patient  Time For Goal Achievement: 03/01/23 Potential to Achieve Goals: Good ADL Goals Pt Will Perform Eating: with supervision;sitting Pt Will Perform Grooming: with supervision;sitting Pt Will Perform Lower Body Dressing: with mod  assist Pt Will Transfer to Toilet: with mod assist;stand pivot transfer Pt Will Perform Toileting - Clothing Manipulation and hygiene: with mod assist;sitting/lateral leans Pt/caregiver will Perform Home Exercise Program: Increased ROM;Increased strength;Left upper extremity;With written HEP provided  OT Frequency: Min 1X/week    Co-evaluation              AM-PAC OT "6 Clicks" Daily Activity     Outcome Measure Help from another person eating meals?: A Lot Help from another person taking care of personal grooming?: A Lot Help from another person toileting, which includes using toliet, bedpan, or urinal?: Total Help from another person bathing (including washing, rinsing, drying)?: A Lot Help from another person to put on and taking off regular upper body clothing?: A Lot Help from another person to put on and taking off regular lower body clothing?: A Lot 6 Click Score: 11   End of Session Equipment Utilized During Treatment: Oxygen  Nurse Communication: Mobility status  Activity Tolerance: Patient tolerated treatment well Patient left: in bed;with call bell/phone within reach;with bed alarm set  OT Visit Diagnosis: Unsteadiness on feet (R26.81);Muscle weakness (generalized) (M62.81);Other abnormalities of gait and mobility (R26.89);Other symptoms and signs involving the nervous system (R29.898);Cognitive communication deficit (R41.841);Hemiplegia and hemiparesis Hemiplegia - Right/Left: Left Hemiplegia - dominant/non-dominant: Dominant                Time: 0454-0981 OT Time Calculation (min): 31 min Charges:  OT General Charges $OT Visit: 1 Visit OT Evaluation $OT Eval High Complexity: 1 High  Gerre Kraft, OTD OTR/L  02/15/23, 4:22 PM

## 2023-02-15 NOTE — Assessment & Plan Note (Addendum)
Patient able to straight leg raise today and use his left arm.

## 2023-02-15 NOTE — Subjective & Objective (Addendum)
Pt seen and examined.  Pt's home vent Trilogy was brought to his room.  Awaiting SNF placement.

## 2023-02-15 NOTE — Progress Notes (Signed)
 PROGRESS NOTE    Raymond [name]  RUE:[id number] DOB: [birth year] DOA: 02/14/2023 PCP: Chuckie Craven, NP  Subjective: Pt seen and examined. Pt aware of his CVA diagnosis. Had difficulty swallowing. Now on pureed diet with nectar thick liquids.  Awaiting echo.  Discussed with neurology. Ok to start chemical DVT prophylaxis.  Pt is left hand dominant. Still with left side hemiplegia and dysarthria.   Hospital Course: HPI: Raymond [name] is a 58 y.o. male with medical history significant of morbid obesity, type 2 diabetes, chronic HFpEF,  hypertension and OSA on CPAP came to ED with complaint of left-sided weakness and dysarthria.   Patient went to bed around 10 PM yesterday and was at baseline, this morning when he woke up he was having left-sided weakness and difficulty speaking so EMS was called.   Patient was also having some shortness of breath and confusion.  He was hypoxic at 75% with EMS.  Brought in as code stroke.  Teleneurology was consulted.  He was out of window for thrombolytics.   At baseline patient morbidly obese and does not move around a lot.  Per family he was using CPAP at night.   Patient denies any recent illnesses, chest pain.  He does have some orthopnea and exertional dyspnea.  Some worsening lower extremity edema.  No recent change in appetite or bowel habits.  No urinary symptoms.  Significant Events: Admitted 02/14/2023 for acute CVA   Significant Labs: VBG pH 7.23, PCO2 of 79 WBC 12.9, Hgb 10, sCr 2.01, BUN 30, BNP 231  Significant Imaging Studies:  Chest x-ray with low lung volume, cardiomegaly and concern of pulmonary vascular congestion.  CT head with chronic infarcts with no acute abnormality.  CTA head and neck with no LVO   Antibiotic Therapy: Anti-infectives (From admission, onward)    None       Procedures: 02-14-2023 started on bipap  Consultants: Neurology    Assessment and Plan: * Acute ischemic  stroke (HCC) On admission, Presented with left-sided weakness and some dysarthria when he woke up this morning, LKW around 10 PM last night, out of window for thrombolytic.  Initial CT head was negative for any acute abnormality, CTA negative for LVO.  Neurology was consulted. Continue to have left sided weakness. -Admit to progressive care -MRI brain-body habitus might be a challenge. -Lipid profile and A1c -PT and OT evaluation -Echocardiogram -Aspirin  and Plavix  as DAPT, if MRI is negative and symptoms resolved then we can discontinue Plavix . -Continue home Lipitor  02-15-2023 MRI brain confirms Acute cortical and subcortical infarction in the right posterior frontal lobe. Petechial blood products present in the region without focal hematoma. Pt started on DAPT. I Have let [facility] clinic know that he will need ambulatory cardiac monitor.  Pharyngeal dysphagia 02-15-2023 due to CVA. On dysphagia 1(pureed) diet with nectar thick liquids.  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 Prior echocardiogram with normal EF, indeterminate diastolic function.  Received a dose of IV Lasix . -Repeat echocardiogram ordered as stroke workup -Continue with IV Lasix  40 mg twice daily -Daily weight and BMP -Strict intake and output  02-15-2023 last recorded outpatient weight was in 12-2022 of 402 lbs. Today's weight 447 lbs. Continue  with IV lasix  bid 40 mg. Awaiting echo.   AKI (acute kidney injury) (HCC) On admission, Creatinine at 2 with baseline  appears to be at 1. -Patient is being diuresed -Monitor renal function -Avoid nephrotoxins  02-15-2023 ScR  1.53 today. Continue with IV lasix  for acute on chronic diastolic CHF. Hold all other nephrotoxic agents for now until diuresis has been completed.  Microcytic anemia On admission, Hemoglobin at 10 with MCV of 78.  Baseline seems to be above 11, slowly decreasing.  No obvious bleeding.  Colonoscopy last year with removal of few polyps with negative pathology. -Check anemia panel -Start him on supplement -Outpatient follow-up with PCP  02-15-2023 HgB 9.8 today. Iron  studies show iron  deficiency.  Iron /TIBC/Ferritin/ %Sat    Component Value Date/Time   IRON  19 (L) 02/14/2023 1350   TIBC 410 02/14/2023 1350   FERRITIN 16 (L) 02/14/2023 1350   IRONPCTSAT 5 (L) 02/14/2023 1350    Essential hypertension On admission, Blood pressure currently within goal.  Patient was on torsemide , Coreg  and valsartan at home. -Holding home antihypertensives for permissive hypertension. -Labetalol  as needed for systolic above 200  02-15-2023 allow for permissive HTN for 24 more hours. Then work on normotension tomorrow.  Obesity, Class III, BMI 40-49.9 (morbid obesity) (HCC) Estimated body mass index is 57.44 kg/m as calculated from the following:   Height as of this encounter: 6\' 2"  (1.88 m).   Weight as of this encounter: 202.9 kg.   -This will complicate overall prognosis -Encouraged weight loss  Acute on chronic respiratory failure with hypoxia and hypercapnia (HCC) - suppose to have home Trilogy machine since March 2015 On admission, Found to have hypoxia and hypercarbia.  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  Patient was using CPAP at home, not sure about the compliance.  He was placed on BiPAP in ED. chest x-ray with concern of pulmonary vascular congestion. uses 2 L of oxygen  at baseline -Use BiPAP at night and while taking a nap. -Continue with supplemental oxygen -keep the saturation above 90% -Incentive spirometry  02-15-2023 pt states he is suppose to be on home O2  and Trilogy. He is not compliant with wearing home O2. Continue with nightly NIV. Review of EMR says that pt was qualified for home Trilogy after March 2015 admission.  Type 2 diabetes mellitus (HCC) On admission, Patient had 1 episode of hypoglycemia, he has not eaten since last night.  Uses a lot of insulin  at home. -Continue to monitor -Semglee  at 30 units twice daily which is half the home dose -Resistant SSI -Check A1c.  02-15-2023 has multiple low CBG this AM. Requiring IV D50. Hold lantus  for now. Continue with SSI.       DVT prophylaxis: heparin  injection 5,000 Units Start: 02/15/23 1400     Code Status: Full Code Family Communication: no family at bedside Disposition Plan: home vs SNF Reason for continuing need for hospitalization: awaiting echo, remains on IV lasix  for acute CHF.  Objective: Vitals:   02/15/23 0700 02/15/23 0745 02/15/23 0800 02/15/23 1146  BP: (!) 141/54 (!) 148/51 (!) 157/59 (!) 131/56  Pulse: 65 64 71 69  Resp: 16 (!) 22 (!) 22 18  Temp:   98.8 F (37.1 C) 97.9 F (36.6 C)  TempSrc:   Oral   SpO2: 96% 96% 92% 100%  Weight:      Height:        Intake/Output Summary (Last 24 hours) at 02/15/2023 1255 Last data filed at 02/14/2023  1623 Gross per 24 hour  Intake --  Output 1625 ml  Net -1625 ml   Filed Weights   02/14/23 0730  Weight: (!) 202.9 kg    Examination:  Physical Exam Vitals and nursing note reviewed.  Constitutional:      Appearance: He is obese.  HENT:     Head: Normocephalic and atraumatic.     Nose: Nose normal.  Eyes:     General: No scleral icterus. Cardiovascular:     Rate and Rhythm: Normal rate and regular rhythm.  Pulmonary:     Effort: Pulmonary effort is normal.  Abdominal:     General: Bowel sounds are normal.     Palpations: Abdomen is soft.  Musculoskeletal:     Right lower leg: 2+ Edema present.     Left lower leg: 2+ Edema present.     Right ankle: Swelling present.     Left ankle: Swelling  present.     Comments: +2 pitting bilateral LE edema. Pretibial, ankle and feet bilaterally  Skin:    General: Skin is warm and dry.     Capillary Refill: Capillary refill takes less than 2 seconds.  Neurological:     Mental Status: He is alert.     Comments: 4/5 left UE strength. Able to resistant gravity with left arm 4/5 left LE strength. Able to briefly lift left leg off bed.     Data Reviewed: I have personally reviewed following labs and imaging studies  CBC: Recent Labs  Lab 02/14/23 0650 02/15/23 0336  WBC 12.9* 10.4  NEUTROABS 7.7  --   HGB 10.0* 9.8*  HCT 36.9* 37.3*  MCV 78.8* 81.1  PLT 290 259   Basic Metabolic Panel: Recent Labs  Lab 02/14/23 0650 02/15/23 0336  NA 143 145  K 4.1 4.3  CL 102 101  CO2 31 33*  GLUCOSE 97 52*  BUN 30* 27*  CREATININE 2.01* 1.53*  CALCIUM  8.1* 8.1*   GFR: Estimated Creatinine Clearance: 96 mL/min (A) (by C-G formula based on SCr of 1.53 mg/dL (H)). Liver Function Tests: Recent Labs  Lab 02/14/23 0650  AST 15  ALT 26  ALKPHOS 70  BILITOT 0.8  PROT 6.8  ALBUMIN  3.3*   Coagulation Profile: Recent Labs  Lab 02/14/23 0650  INR 1.1   BNP (last 3 results) Recent Labs    09/09/22 1516 02/14/23 0650  BNP 33.4 231.3*   HbA1C: Recent Labs    02/14/23 1350  HGBA1C 6.7*   CBG: Recent Labs  Lab 02/15/23 0650 02/15/23 0735 02/15/23 0820 02/15/23 1148 02/15/23 1213  GLUCAP 69* 65* 82 52* 74   Lipid Profile: Recent Labs    02/15/23 0336  CHOL 54  HDL 38*  LDLCALC 12  TRIG 20  CHOLHDL 1.4   Anemia Panel: Recent Labs    02/14/23 1350  VITAMINB12 789  FOLATE 15.3  FERRITIN 16*  TIBC 410  IRON  19*  RETICCTPCT 3.0    Radiology Studies: MR BRAIN WO CONTRAST Result Date: 02/14/2023 CLINICAL DATA:  Neuro deficit, acute, stroke suspected. EXAM: MRI HEAD WITHOUT CONTRAST TECHNIQUE: Multiplanar, multiecho pulse sequences of the brain and surrounding structures were obtained without intravenous  contrast. COMPARISON:  CT studies earlier same day FINDINGS: Brain: Diffusion imaging shows acute cortical and subcortical infarction in the right posterior frontal lobe. Petechial blood products present in the region without focal hematoma. No other acute insult. Otherwise, there are old cerebellar infarctions on the right. No focal brainstem insult. Old left parietal  cortical and subcortical infarction. Few scattered old small vessel infarctions in both hemispheres affecting the white matter. Few other small foci of hemosiderin deposition related to old infarctions. No mass, hydrocephalus or extra-axial collection. Vascular: Major vessels at the base of the brain show flow. Skull and upper cervical spine: Negative Sinuses/Orbits: Mild mucosal inflammatory changes of the paranasal sinuses. No advanced finding. Orbits negative. Other: None IMPRESSION: 1. Acute cortical and subcortical infarction in the right posterior frontal lobe. Petechial blood products present in the region without focal hematoma. 2. 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 Few scattered old small vessel infarctions in both hemispheres affecting the white matter. Electronically Signed   By: Bettylou Brunner M.D.   On: 02/14/2023 14:42   DG Chest Port 1 View Result Date: 02/14/2023 CLINICAL DATA:  Shortness of breath. EXAM: PORTABLE CHEST 1 VIEW COMPARISON:  Chest radiograph dated September 09, 2022. FINDINGS: Low lung volumes. The heart is mildly enlarged, unchanged. Bilateral interstitial prominence. No focal consolidation, sizeable pleural effusion, or pneumothorax identified. No acute osseous abnormality. IMPRESSION: Low lung volumes. Cardiomegaly with findings suggestive of pulmonary vascular congestion. Electronically Signed   By: Mannie Seek M.D.   On: 02/14/2023 08:48   CT Angio Head Neck W WO CM (CODE STROKE) Result Date: 02/14/2023 CLINICAL DATA:  Last known well 2200 hours EXAM: CT  ANGIOGRAPHY HEAD AND NECK CT PERFUSION BRAIN TECHNIQUE: Multidetector CT imaging of the head and neck was performed using the standard protocol during bolus administration of intravenous contrast. Multiplanar CT image reconstructions and MIPs were obtained to evaluate the vascular anatomy. Carotid stenosis measurements (when applicable) are obtained utilizing NASCET criteria, using the distal internal carotid diameter as the denominator. Multiphase CT imaging of the brain was performed following IV bolus contrast injection. Subsequent parametric perfusion maps were calculated using RAPID software. RADIATION DOSE REDUCTION: This exam was performed according to the departmental dose-optimization program which includes automated exposure control, adjustment of the mA and/or kV according to patient size and/or use of iterative reconstruction technique. CONTRAST:  75mL OMNIPAQUE  IOHEXOL  350 MG/ML SOLN; 40mL OMNIPAQUE  IOHEXOL  350 MG/ML SOLN COMPARISON:  Head CT from earlier today FINDINGS: CTA NECK FINDINGS Aortic arch: Suboptimal bolus density, no significant finding. Right carotid system: Partial retropharyngeal course. No stenosis or luminal irregularity. Left carotid system: Partial retropharyngeal course. Mild calcified plaque at the proximal ICA. No stenosis or luminal irregularity. Vertebral arteries: Soft tissue attenuation and suboptimal bolus density affects detailed visualization of the vertebral arteries, especially proximally. Both vertebral arteries are symmetrically enhancing at the skull base. No proximal subclavian stenosis seen. Skeleton: No acute finding Other neck: No acute finding Upper chest: Clear apical lungs Review of the MIP images confirms the above findings CTA HEAD FINDINGS Anterior circulation: No significant stenosis, proximal occlusion, aneurysm, or vascular malformation. Posterior circulation: Hypoplastic P1 segments, especially on the left. No major branch occlusion, beading, or aneurysm.  Venous sinuses: Unremarkable Anatomic variants: As above Review of the MIP images confirms the above findings CT perfusion: Motion artifact, possibly exacerbated by ACA arterial input selection, causes noncontributory highlighting of most of the brain on the time to peak maps. IMPRESSION: CTA: 1. No emergent finding. 2. Minimal calcified atherosclerosis. 3. Suboptimal bolus density and soft tissue attenuation limits visualization of arteries in the neck, especially the proximal vertebral arteries. CT perfusion: Noncontributory due to artifact. Electronically Signed   By: Ronnette Coke M.D.   On: 02/14/2023 07:24   CT CEREBRAL PERFUSION W CONTRAST Result Date:  02/14/2023 CLINICAL DATA:  Last known well 2200 hours EXAM: CT ANGIOGRAPHY HEAD AND NECK CT PERFUSION BRAIN TECHNIQUE: Multidetector CT imaging of the head and neck was performed using the standard protocol during bolus administration of intravenous contrast. Multiplanar CT image reconstructions and MIPs were obtained to evaluate the vascular anatomy. Carotid stenosis measurements (when applicable) are obtained utilizing NASCET criteria, using the distal internal carotid diameter as the denominator. Multiphase CT imaging of the brain was performed following IV bolus contrast injection. Subsequent parametric perfusion maps were calculated using RAPID software. RADIATION DOSE REDUCTION: This exam was performed according to the departmental dose-optimization program which includes automated exposure control, adjustment of the mA and/or kV according to patient size and/or use of iterative reconstruction technique. CONTRAST:  75mL OMNIPAQUE  IOHEXOL  350 MG/ML SOLN; 40mL OMNIPAQUE  IOHEXOL  350 MG/ML SOLN COMPARISON:  Head CT from earlier today FINDINGS: CTA NECK FINDINGS Aortic arch: Suboptimal bolus density, no significant finding. Right carotid system: Partial retropharyngeal course. No stenosis or luminal irregularity. Left carotid system: Partial  retropharyngeal course. Mild calcified plaque at the proximal ICA. No stenosis or luminal irregularity. Vertebral arteries: Soft tissue attenuation and suboptimal bolus density affects detailed visualization of the vertebral arteries, especially proximally. Both vertebral arteries are symmetrically enhancing at the skull base. No proximal subclavian stenosis seen. Skeleton: No acute finding Other neck: No acute finding Upper chest: Clear apical lungs Review of the MIP images confirms the above findings CTA HEAD FINDINGS Anterior circulation: No significant stenosis, proximal occlusion, aneurysm, or vascular malformation. Posterior circulation: Hypoplastic P1 segments, especially on the left. No major branch occlusion, beading, or aneurysm. Venous sinuses: Unremarkable Anatomic variants: As above Review of the MIP images confirms the above findings CT perfusion: Motion artifact, possibly exacerbated by ACA arterial input selection, causes noncontributory highlighting of most of the brain on the time to peak maps. IMPRESSION: CTA: 1. No emergent finding. 2. Minimal calcified atherosclerosis. 3. Suboptimal bolus density and soft tissue attenuation limits visualization of arteries in the neck, especially the proximal vertebral arteries. CT perfusion: Noncontributory due to artifact. Electronically Signed   By: Ronnette Coke M.D.   On: 02/14/2023 07:24   CT HEAD CODE STROKE WO CONTRAST Result Date: 02/14/2023 CLINICAL DATA:  Code stroke.  Acute stroke suspected EXAM: CT HEAD WITHOUT CONTRAST TECHNIQUE: Contiguous axial images were obtained from the base of the skull through the vertex without intravenous contrast. RADIATION DOSE REDUCTION: This exam was performed according to the departmental dose-optimization program which includes automated exposure control, adjustment of the mA and/or kV according to patient size and/or use of iterative reconstruction technique. COMPARISON:  Brain MRI 10/20/2011 FINDINGS: Brain:  No evidence of acute infarction, hemorrhage, hydrocephalus, extra-axial collection or mass lesion/mass effect. Small chronic infarcts clustered in the right cerebellum. Small moderate chronic left occipital parietal cortex infarct. Small chronic appearing right posterior frontal cortex infarct. These infarcts pleural accounted for on prior brain MRI. Vascular: No hyperdense vessel or unexpected calcification. Skull: Normal. Negative for fracture or focal lesion. Posterior scalp scarring and reticulation. Sinuses/Orbits: No acute finding ASPECTS [facility] Stroke Program Early CT Score) Not scored without localizing symptom Prelim sent in epic chat. IMPRESSION: Chronic infarcts that are stable from 2013 brain MRI. No hemorrhage or visible acute infarct. Electronically Signed   By: Ronnette Coke M.D.   On: 02/14/2023 07:07    Scheduled Meds:  aspirin  EC  81 mg Oral Daily   atorvastatin   40 mg Oral Daily   Chlorhexidine  Gluconate Cloth  6 each Topical Daily  clopidogrel   75 mg Oral Daily   Fe Fum-Vit C-Vit B12-FA  1 capsule Oral BID   furosemide   40 mg Intravenous BID   heparin  injection (subcutaneous)  5,000 Units Subcutaneous Q8H   insulin  aspart  0-20 Units Subcutaneous TID WC   insulin  aspart  0-5 Units Subcutaneous QHS   Continuous Infusions:   LOS: 1 day   Time spent: 45 minutes  Unk Garb, DO  [facility]  02/15/2023, 12:55 PM

## 2023-02-15 NOTE — Hospital Course (Addendum)
HPI: 58 y.o. male with medical history significant of morbid obesity, type 2 diabetes, chronic HFpEF,  hypertension and OSA on CPAP came to ED with complaint of left-sided weakness and dysarthria.   Patient went to bed around 10 PM and was at baseline, this morning when he woke up he was having left-sided weakness and difficulty speaking so EMS was called.   Patient was also having some shortness of breath and confusion.  He was hypoxic at 75% with EMS.  Brought in as code stroke.  Teleneurology was consulted.  He was out of window for thrombolytics.   At baseline patient morbidly obese and does not move around a lot.  Per family he was using CPAP at night.   Patient denies any recent illnesses, chest pain.  He does have some orthopnea and exertional dyspnea.  Some worsening lower extremity edema.  No recent change in appetite or bowel habits.  No urinary symptoms.  Significant Events: Admitted 02/14/2023 for acute CVA MRI showing acute cortical and subcortical infarction of the right posterior frontal lobe petechial blood products present in the region without focal hematoma.  Old right cerebral infarction, old left parietal cortical and subcortical infarction. On trilogy with elevated pCO2  TOC expanding search for rehab beds.  1/19.  Foley catheter removed 1/20.  Creatinine up at 1.67.  Will give fluid bolus.  Started low-dose Semglee insulin 1/21.  Creatinine 1.45.  With sugars elevated will increase Semglee insulin 25 units daily. 1/22: Creatinine 1.57, CBG remained elevated, increased Semglee to 20 units twice daily and added 6 units of NovoLog with meal.  Also restarting home torsemide due to worsening lower extremity edema. 1/23: Slowly improving creatinine, CBG remained elevated, increasing Semglee to 25 units twice daily, still awaiting disposition 1/24: Semglee was increased to 30 units twice daily 1/25: CBG remained elevated so increasing Semglee to 35 units twice daily and mealtime  coverage to 8 units.  Difficult disposition as patient does not want to go out of [place] and none of the building to facilities are able to accept him.  Discussed with patient and his girlfriend who lives with him and they both agree to go home with home health.  Home health services ordered and likely be discharged tomorrow.  1/26: Patient remained hemodynamically stable and is being discharged home according to his wishes with home health services.  Patient will continue on aspirin and Plavix for 3 weeks followed by aspirin only.  He will continue on current medications and need to have a close follow-up with his primary care provider and neurologist for further management.

## 2023-02-15 NOTE — TOC Initial Note (Signed)
 Transition of Care [facility]) - Initial/Assessment Note    Patient Details  Name: Raymond [name] MRN: [id number] Date of Birth: 1965/07/21  Transition of Care [facility]) CM/SW Contact:    Odilia Bennett, LCSW Phone Number: 02/15/2023, 4:23 PM  Clinical Narrative:  CSW met with patient. Sister and girlfriend at bedside. CSW introduced role and explained that therapy recommendations would be discussed. Patient is agreeable to SNF placement. First preference is [facility]. Per RN, patient is not in a bariatric bed yet but they plan to move him to one. She is going to reweigh him after his echo and let this CSW know if 447 lbs is accurate. No further concerns. CSW will continue to follow patient for support and facilitate discharge to SNF once medically stable.                Expected Discharge Plan: Skilled Nursing Facility Barriers to Discharge: Continued Medical Work up   Patient Goals and CMS Choice            Expected Discharge Plan and Services     Post Acute Care Choice: Skilled Nursing Facility Living arrangements for the past 2 months: Apartment                                      Prior Living Arrangements/Services Living arrangements for the past 2 months: Apartment   Patient language and need for interpreter reviewed:: Yes Do you feel safe going back to the place where you live?: Yes      Need for Family Participation in Patient Care: Yes (Comment) Care giver support system in place?: Yes (comment)   Criminal Activity/Legal Involvement Pertinent to Current Situation/Hospitalization: No - Comment as needed  Activities of Daily Living   ADL Screening (condition at time of admission) Independently performs ADLs?: No Is the patient deaf or have difficulty hearing?: No Does the patient have difficulty seeing, even when wearing glasses/contacts?: No Does the patient have difficulty concentrating, remembering, or making decisions?:  No  Permission Sought/Granted Permission sought to share information with : Facility Medical sales representative, Family Supports Permission granted to share information with : Yes, Verbal Permission Granted     Permission granted to share info w AGENCY: SNF's  Permission granted to share info w Relationship: Sister and girlfriend at bedside     Emotional Assessment Appearance:: Appears stated age Attitude/Demeanor/Rapport: Engaged, Gracious Affect (typically observed): Accepting, Appropriate, Calm, Pleasant Orientation: : Oriented to Self, Oriented to Place, Oriented to  Time, Oriented to Situation Alcohol / Substance Use: Not Applicable Psych Involvement: No (comment)  Admission diagnosis:  CVA (cerebral vascular accident) (HCC) [I63.9] Expressive aphasia [R47.01] Left-sided weakness [R53.1] Acute respiratory failure with hypoxia and hypercapnia (HCC) [J96.01, J96.02] Patient Active Problem List   Diagnosis Date Noted   Pharyngeal dysphagia 02/15/2023   Acute ischemic stroke (HCC) 02/14/2023   Microcytic anemia 02/14/2023   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   Dysarthria due to acute cerebrovascular accident (CVA) (HCC) 02/14/2023   Acute on chronic heart failure with preserved ejection fraction (HFpEF) (HCC) 09/09/2022   Essential hypertension 09/09/2022   Type 2 diabetes mellitus without complications (HCC) 09/09/2022   Dyslipidemia 09/09/2022   GERD without esophagitis 09/09/2022   Osteomyelitis of second toe of right foot (HCC) 06/08/2022   Diabetic foot infection (HCC) 06/07/2022   Colon cancer screening 12/27/2021   Adenomatous polyp of colon 12/27/2021  Pneumonia due to COVID-19 virus 02/22/2020   Uncontrolled type 2 diabetes mellitus with hyperglycemia (HCC) 02/08/2020   Obesity, Class III, BMI 40-49.9 (morbid obesity) (HCC) 02/08/2020   Acute hypoxemic respiratory failure due to COVID-19 [facility]) 02/05/2020   Acute on chronic respiratory  failure with hypoxia and hypercapnia (HCC) - suppose to have home Trilogy machine since March 2015 04/28/2017   AKI (acute kidney injury) (HCC) 04/28/2017   Hypotension 04/28/2017   PVD (peripheral vascular disease) (HCC) 04/28/2017   Hypertension 04/28/2017   Diabetes mellitus type 2, insulin  dependent (HCC) 04/28/2017   OSA on CPAP 04/28/2017   Chronic diastolic heart failure, NYHA class 1 (HCC) 04/28/2017   Chest pain with moderate risk for cardiac etiology 04/28/2017   Morbid obesity with BMI of 45.0-49.9, adult (HCC) 04/28/2017   Lymphedema 02/11/2017   HTN (hypertension) 02/01/2017   Type 2 diabetes mellitus (HCC) 02/01/2017   Pneumonia 01/13/2017   Cellulitis of right foot 09/30/2014   COPD, moderate (HCC) 07/07/2014   Chronic diastolic heart failure (HCC) 05/06/2014   OSA treated with Trilogy machine 04/28/2014   Hypoxemia requiring supplemental oxygen  04/28/2014   Morbid obesity (HCC) 04/28/2014   Hyperlipidemia 07/09/2013   PCP:  Chuckie Craven, NP Pharmacy:   [facility] - [place], [place] - [address] [place] [address] Phone: [phone] Fax: [phone]     Social Drivers of Health (SDOH) Social History: SDOH Screenings   Food Insecurity: No Food Insecurity (02/15/2023)  Housing: Unknown (02/15/2023)  Transportation Needs: No Transportation Needs (02/15/2023)  Utilities: Not At Risk (02/15/2023)  Tobacco Use: Low Risk  (02/14/2023)   SDOH Interventions:     Readmission Risk Interventions     No data to display

## 2023-02-15 NOTE — Progress Notes (Signed)
 Hypoglycemic Event  CBG: 52  Treatment: 8 oz juice/soda  Symptoms: None  Follow-up CBG: Time:1213 CBG Result:74  Possible Reasons for Event: Inadequate meal intake  Comments/MD notified:     Drury Ardizzone N Leveon Pelzer

## 2023-02-15 NOTE — Plan of Care (Signed)
  Problem: Education: Goal: Knowledge of disease or condition will improve Outcome: Progressing Goal: Knowledge of secondary prevention will improve (MUST DOCUMENT ALL) Outcome: Progressing Goal: Knowledge of patient specific risk factors will improve [name] N/A or DELETE if not current risk factor) Outcome: Progressing

## 2023-02-15 NOTE — Evaluation (Signed)
 Physical Therapy Evaluation Patient Details Name: Raymond [name] MRN: [id number] DOB: 05/14/1965 Today's Date: 02/15/2023  History of Present Illness  58 y.o. male with medical history significant of morbid obesity, type 2 diabetes, chronic HFpEF,  hypertension and OSA on CPAP came to ED with complaint of left-sided weakness and dysarthria.     Patient went to bed at baseline, woke up having left-sided weakness and difficulty speaking  Clinical Impression  Pt initially with some hesitancy to work with PT as he is feeling very limited since onset of symptoms but with minimal cuing and encouragement agreed to try.  He showed minimal L LE defects but is L handed and displayed clear strength and coordination limitations in the L UE. He showed very good effort with mobility tasks but did need heavy assist to get up to sitting EOB  and then return to supine from sitting EOB.  Pt initially struggled to maintain sitting EOB but with extra assist for positioning/weight shift and cuing tips he did manage to maintain sitting with intermittent need for light assist to insure maintenance of balance.  Deferred standing attempts 2/2 pt weakness and difficulty with static sitting. Pt showed great effort, far from his baseline.  Pt will benefit from further PT to address functional limitations.      If plan is discharge home, recommend the following: Two people to help with walking and/or transfers;Two people to help with bathing/dressing/bathroom;Assistance with cooking/housework;Assist for transportation;Help with stairs or ramp for entrance   Can travel by private vehicle   No    Equipment Recommendations  (TBD at next venue)  Recommendations for Other Services       Functional Status Assessment Patient has had a recent decline in their functional status and demonstrates the ability to make significant improvements in function in a reasonable and predictable amount of time.     Precautions /  Restrictions Precautions Precautions: Fall Restrictions Weight Bearing Restrictions Per Provider Order: No      Mobility  Bed Mobility Overal bed mobility: Needs Assistance Bed Mobility: Supine to Sit, Sit to Supine     Supine to sit: Max assist Sit to supine: Mod assist, Max assist   General bed mobility comments: Pt showed good effort but was very limited with how much he could initiate and sustain movement toward EOB w/o significant assist from PT.    Transfers                   General transfer comment: Pt struggled with sitting EOB, not yet ready to attempt standing/transfers    Ambulation/Gait                  Stairs            Wheelchair Mobility     Tilt Bed    Modified Rankin (Stroke Patients Only)       Balance Overall balance assessment: Needs assistance Sitting-balance support: Bilateral upper extremity supported Sitting balance-Leahy Scale: Fair Sitting balance - Comments: Initially very poor with inability to keep weight forward and off R elbow.  after assist to scoot forward and square hips fully he was able to hold static balance for ~15 second bouts before needing some assist.                                     Pertinent Vitals/Pain      Home Living Family/patient expects  to be discharged to:: Unsure                        Prior Function               Mobility Comments: Pt with difficulty expressing himself fully - seems that he was able to be relatively active without AD ADLs Comments: Pt with difficulty expressing himself fully - seems that he was able to manage ADLs     Extremity/Trunk Assessment   Upper Extremity Assessment Upper Extremity Assessment: Left hand dominant;LUE deficits/detail;Defer to OT evaluation LUE Deficits / Details: Pt with some maintained grip strength, very limited pinch and fine motor    Lower Extremity Assessment Lower Extremity Assessment: Generalized  weakness (L LE seems grossly = to R, AROM against gravity in all planes with effort.)       Communication   Communication Communication:  (halting, slurred speech)  Cognition   Behavior During Therapy: WFL for tasks assessed/performed Overall Cognitive Status: Difficult to assess                                 General Comments: mumbling speech making communication difficulty, but able to follow instructions well and perform in a timely manner        General Comments      Exercises     Assessment/Plan    PT Assessment Patient needs continued PT services  PT Problem List Decreased strength;Decreased range of motion;Decreased activity tolerance;Decreased balance;Decreased mobility;Decreased coordination;Decreased knowledge of use of DME;Decreased safety awareness;Obesity;Impaired tone       PT Treatment Interventions Gait training;DME instruction;Functional mobility training;Therapeutic activities;Therapeutic exercise;Balance training;Neuromuscular re-education;Cognitive remediation;Patient/family education    PT Goals (Current goals can be found in the Care Plan section)  Acute Rehab PT Goals Patient Stated Goal: Get L arm working again PT Goal Formulation: With patient Time For Goal Achievement: 02/28/23 Potential to Achieve Goals: Fair    Frequency Min 1X/week     Co-evaluation               AM-PAC PT "6 Clicks" Mobility  Outcome Measure Help needed turning from your back to your side while in a flat bed without using bedrails?: A Lot Help needed moving from lying on your back to sitting on the side of a flat bed without using bedrails?: Total Help needed moving to and from a bed to a chair (including a wheelchair)?: Total Help needed standing up from a chair using your arms (e.g., wheelchair or bedside chair)?: Total Help needed to walk in hospital room?: Total Help needed climbing 3-5 steps with a railing? : Total 6 Click Score: 7    End  of Session   Activity Tolerance: Patient tolerated treatment well;Patient limited by fatigue Patient left: with bed alarm set;with call bell/phone within reach;with nursing/sitter in room Nurse Communication: Mobility status PT Visit Diagnosis: Muscle weakness (generalized) (M62.81);Difficulty in walking, not elsewhere classified (R26.2);Unsteadiness on feet (R26.81);Hemiplegia and hemiparesis;Other symptoms and signs involving the nervous system (R29.898) Hemiplegia - Right/Left: Left Hemiplegia - dominant/non-dominant: Dominant Hemiplegia - caused by: Cerebral infarction    Time: 0910-0930 PT Time Calculation (min) (ACUTE ONLY): 20 min   Charges:   PT Evaluation $PT Eval Low Complexity: 1 Low PT Treatments $Therapeutic Activity: 8-22 mins PT General Charges $$ ACUTE PT VISIT: 1 Visit         Darice Edelman, DPT 02/15/2023, 12:23 PM

## 2023-02-16 ENCOUNTER — Encounter: Payer: Self-pay | Admitting: Internal Medicine

## 2023-02-16 DIAGNOSIS — I69952 Hemiplegia and hemiparesis following unspecified cerebrovascular disease affecting left dominant side: Secondary | ICD-10-CM | POA: Diagnosis not present

## 2023-02-16 DIAGNOSIS — I5033 Acute on chronic diastolic (congestive) heart failure: Secondary | ICD-10-CM | POA: Diagnosis not present

## 2023-02-16 DIAGNOSIS — I639 Cerebral infarction, unspecified: Secondary | ICD-10-CM | POA: Diagnosis not present

## 2023-02-16 DIAGNOSIS — N179 Acute kidney failure, unspecified: Secondary | ICD-10-CM | POA: Diagnosis not present

## 2023-02-16 LAB — COMPREHENSIVE METABOLIC PANEL
ALT: 17 U/L (ref 0–44)
AST: 12 U/L — ABNORMAL LOW (ref 15–41)
Albumin: 3.2 g/dL — ABNORMAL LOW (ref 3.5–5.0)
Alkaline Phosphatase: 66 U/L (ref 38–126)
Anion gap: 10 (ref 5–15)
BUN: 24 mg/dL — ABNORMAL HIGH (ref 6–20)
CO2: 35 mmol/L — ABNORMAL HIGH (ref 22–32)
Calcium: 8.4 mg/dL — ABNORMAL LOW (ref 8.9–10.3)
Chloride: 101 mmol/L (ref 98–111)
Creatinine, Ser: 1.31 mg/dL — ABNORMAL HIGH (ref 0.61–1.24)
GFR, Estimated: >60 mL/min (ref >60)
Glucose, Bld: 130 mg/dL — ABNORMAL HIGH (ref 70–99)
Potassium: 4.3 mmol/L (ref 3.5–5.1)
Sodium: 146 mmol/L — ABNORMAL HIGH (ref 135–145)
Total Bilirubin: 1.1 mg/dL (ref 0.0–1.2)
Total Protein: 6.4 g/dL — ABNORMAL LOW (ref 6.5–8.1)

## 2023-02-16 LAB — GLUCOSE, CAPILLARY
Glucose-Capillary: 97 mg/dL (ref 70–99)
Glucose-Capillary: 111 mg/dL — ABNORMAL HIGH (ref 70–99)
Glucose-Capillary: 104 mg/dL — ABNORMAL HIGH (ref 70–99)
Glucose-Capillary: 123 mg/dL — ABNORMAL HIGH (ref 70–99)

## 2023-02-16 LAB — MAGNESIUM: Magnesium: 2.4 mg/dL (ref 1.7–2.4)

## 2023-02-16 LAB — BRAIN NATRIURETIC PEPTIDE: B Natriuretic Peptide: 101.3 pg/mL — ABNORMAL HIGH (ref 0.0–100.0)

## 2023-02-16 MED ORDER — CARVEDILOL 6.25 MG PO TABS
12.5000 mg | ORAL_TABLET | Freq: Two times a day (BID) | ORAL | Status: DC
  Administered 2023-02-16: 12.5 mg via ORAL
  Filled 2023-02-16: qty 2

## 2023-02-16 MED ORDER — LABETALOL HCL 5 MG/ML IV SOLN: 10.0000 mg | INTRAVENOUS | Status: DC | PRN

## 2023-02-16 MED ORDER — FUROSEMIDE 10 MG/ML IJ SOLN
40.0000 mg | Freq: Two times a day (BID) | INTRAMUSCULAR | Status: DC
  Administered 2023-02-16: 40 mg via INTRAVENOUS
  Filled 2023-02-16: qty 4

## 2023-02-16 NOTE — NC FL2 (Signed)
[place] MEDICAID FL2 LEVEL OF CARE FORM     IDENTIFICATION  Patient Name: Raymond [name] Birthdate: 1965/12/04 Sex: male Admission Date (Current Location): 02/14/2023  [facility] and [place] Number:  Chiropodist and Address:  [facility], [address], [place], [place] [address]      Provider Number: [id number]  Attending Physician Name and Address:  Carollee Herter, DO  Relative Name and Phone Number:       Current Level of Care: Hospital Recommended Level of Care: Skilled Nursing Facility Prior Approval Number:    Date Approved/Denied:   PASRR Number: [id number]  Discharge Plan: SNF    Current Diagnoses: Patient Active Problem List   Diagnosis Date Noted   Pharyngeal dysphagia 02/15/2023   Acute ischemic stroke (HCC) 02/14/2023   Microcytic anemia 02/14/2023   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   Dysarthria due to acute cerebrovascular accident (CVA) (HCC) 02/14/2023   Acute on chronic heart failure with preserved ejection fraction (HFpEF) (HCC) 09/09/2022   Essential hypertension 09/09/2022   Type 2 diabetes mellitus without complications (HCC) 09/09/2022   Dyslipidemia 09/09/2022   GERD without esophagitis 09/09/2022   Osteomyelitis of second toe of right foot (HCC) 06/08/2022   Diabetic foot infection (HCC) 06/07/2022   Colon cancer screening 12/27/2021   Adenomatous polyp of colon 12/27/2021   Pneumonia due to COVID-19 virus 02/22/2020   Uncontrolled type 2 diabetes mellitus with hyperglycemia (HCC) 02/08/2020   Obesity, Class III, BMI 40-49.9 (morbid obesity) (HCC) 02/08/2020   Acute hypoxemic respiratory failure due to COVID-19 (HCC) 02/05/2020   Acute on chronic respiratory failure with hypoxia and hypercapnia (HCC) - suppose to have home Trilogy machine since March 2015 04/28/2017   AKI (acute kidney injury) (HCC) 04/28/2017   Hypotension 04/28/2017   PVD (peripheral vascular  disease) (HCC) 04/28/2017   Hypertension 04/28/2017   Diabetes mellitus type 2, insulin dependent (HCC) 04/28/2017   OSA on CPAP 04/28/2017   Chronic diastolic heart failure, NYHA class 1 (HCC) 04/28/2017   Chest pain with moderate risk for cardiac etiology 04/28/2017   Morbid obesity with BMI of 45.0-49.9, adult (HCC) 04/28/2017   Lymphedema 02/11/2017   HTN (hypertension) 02/01/2017   Type 2 diabetes mellitus (HCC) 02/01/2017   Pneumonia 01/13/2017   Cellulitis of right foot 09/30/2014   COPD, moderate (HCC) 07/07/2014   Chronic diastolic heart failure (HCC) 05/06/2014   OSA treated with Trilogy machine 04/28/2014   Hypoxemia requiring supplemental oxygen 04/28/2014   Morbid obesity (HCC) 04/28/2014   Hyperlipidemia 07/09/2013    Orientation RESPIRATION BLADDER Height & Weight     Self, Time, Situation, Place  O2 (Nasal cannula 4 L. Bipap QHS: 20/8 at 30%.) Incontinent, Indwelling catheter Weight: (!) 487 lb 3.5 oz (221 kg) Height:  6\' 2"  (188 cm)  BEHAVIORAL SYMPTOMS/MOOD NEUROLOGICAL BOWEL NUTRITION STATUS   (None)   Continent Diet (DYS 2. Extra sauces and gravies.)  AMBULATORY STATUS COMMUNICATION OF NEEDS Skin   Extensive Assist Verbally Normal                       Personal Care Assistance Level of Assistance  Bathing, Feeding, Dressing Bathing Assistance: Maximum assistance Feeding assistance: Limited assistance Dressing Assistance: Maximum assistance     Functional Limitations Info  Sight, Hearing, Speech Sight Info: Adequate Hearing Info: Adequate Speech Info: Adequate    SPECIAL CARE FACTORS FREQUENCY  PT (By licensed PT), OT (By licensed OT), Speech therapy  PT Frequency: 5 x week OT Frequency: 5 x week     Speech Therapy Frequency: 5 x week      Contractures Contractures Info: Not present    Additional Factors Info  Code Status, Allergies Code Status Info: Full code Allergies Info: NKDA           Current Medications (02/16/2023):   This is the current hospital active medication list Current Facility-Administered Medications  Medication Dose Route Frequency Provider Last Rate Last Admin   acetaminophen (TYLENOL) tablet 650 mg  650 mg Oral Q6H PRN Arnetha Courser, MD   650 mg at 02/15/23 2053   Or   acetaminophen (TYLENOL) suppository 650 mg  650 mg Rectal Q6H PRN Arnetha Courser, MD       aspirin EC tablet 81 mg  81 mg Oral Daily Arnetha Courser, MD   81 mg at 02/16/23 0958   atorvastatin (LIPITOR) tablet 40 mg  40 mg Oral Daily Arnetha Courser, MD   40 mg at 02/16/23 4098   Chlorhexidine Gluconate Cloth 2 % PADS 6 each  6 each Topical Daily Carollee Herter, DO   6 each at 02/16/23 0959   clopidogrel (PLAVIX) tablet 75 mg  75 mg Oral Daily Arnetha Courser, MD   75 mg at 02/16/23 0958   Fe Fum-Vit C-Vit B12-FA (TRIGELS-F FORTE) capsule 1 capsule  1 capsule Oral BID Arnetha Courser, MD   1 capsule at 02/15/23 2056   heparin injection 5,000 Units  5,000 Units Subcutaneous Q8H Carollee Herter, DO   5,000 Units at 02/16/23 1335   insulin aspart (novoLOG) injection 0-20 Units  0-20 Units Subcutaneous TID WC Arnetha Courser, MD   7 Units at 02/15/23 1745   insulin aspart (novoLOG) injection 0-5 Units  0-5 Units Subcutaneous QHS Arnetha Courser, MD       labetalol (NORMODYNE) injection 10 mg  10 mg Intravenous Q2H PRN Arnetha Courser, MD       ondansetron (ZOFRAN) tablet 4 mg  4 mg Oral Q6H PRN Arnetha Courser, MD       Or   ondansetron (ZOFRAN) injection 4 mg  4 mg Intravenous Q6H PRN Arnetha Courser, MD       polyethylene glycol (MIRALAX / GLYCOLAX) packet 17 g  17 g Oral Daily PRN Arnetha Courser, MD         Discharge Medications: Please see discharge summary for a list of discharge medications.  Relevant Imaging Results:  Relevant Lab Results:   Additional Information SS#: [id number]  Margarito Liner, LCSW

## 2023-02-16 NOTE — Progress Notes (Signed)
Patient brought to unit on bariatric bed by transport personell, alert and orientated x 4. On 4 L of O2 via nasal cannula.IV intact to rt hand, foley attached and intact. No complaint at the moment, orientated to room and call bell.

## 2023-02-16 NOTE — Progress Notes (Signed)
PROGRESS NOTE    Raymond [name]  UVO:[id number] DOB: 10-03-1965 DOA: 02/14/2023 PCP: Martie Round, NP  Subjective: Pt seen and examined.  Pt with pt's sister Rivka Barbara and pt's brother-in-law at bedside today.  Had to repeat to pt that he had a CVA. Pt does not remember our conversation from yesterday. Appears that ST has upgraded his diet to dysphagia-2. Changed to thin liquids.  Echo showed normal LVEF 60%. No intra-cardiac shunt.  Pt agreeable to going to SNF at discharge.     Hospital Course: HPI: Raymond [name] is a 58 y.o. male with medical history significant of morbid obesity, type 2 diabetes, chronic HFpEF,  hypertension and OSA on CPAP came to ED with complaint of left-sided weakness and dysarthria.   Patient went to bed around 10 PM yesterday and was at baseline, this morning when he woke up he was having left-sided weakness and difficulty speaking so EMS was called.   Patient was also having some shortness of breath and confusion.  He was hypoxic at 75% with EMS.  Brought in as code stroke.  Teleneurology was consulted.  He was out of window for thrombolytics.   At baseline patient morbidly obese and does not move around a lot.  Per family he was using CPAP at night.   Patient denies any recent illnesses, chest pain.  He does have some orthopnea and exertional dyspnea.  Some worsening lower extremity edema.  No recent change in appetite or bowel habits.  No urinary symptoms.  Significant Events: Admitted 02/14/2023 for acute CVA   Significant Labs: VBG pH 7.23, PCO2 of 79 WBC 12.9, Hgb 10, sCr 2.01, BUN 30, BNP 231  Significant Imaging Studies:  Chest x-ray with low lung volume, cardiomegaly and concern of pulmonary vascular congestion.  CT head with chronic infarcts with no acute abnormality.  CTA head and neck with no LVO   Antibiotic Therapy: Anti-infectives (From admission, onward)    None       Procedures: 02-14-2023  started on bipap  Consultants: Neurology    Assessment and Plan: * Acute ischemic stroke (HCC) On admission, Presented with left-sided weakness and some dysarthria when he woke up this morning, LKW around 10 PM last night, out of window for thrombolytic.  Initial CT head was negative for any acute abnormality, CTA negative for LVO.  Neurology was consulted. Continue to have left sided weakness. -Admit to progressive care -MRI brain-body habitus might be a challenge. -Lipid profile and A1c -PT and OT evaluation -Echocardiogram -Aspirin and Plavix as DAPT, if MRI is negative and symptoms resolved then we can discontinue Plavix. -Continue home Lipitor 02-15-2023 MRI brain confirms Acute cortical and subcortical infarction in the right posterior frontal lobe. Petechial blood products present in the region without focal hematoma. Pt started on DAPT. I Have let [facility] clinic know that he will need ambulatory cardiac monitor.  02-16-2023 restart BP meds. Goal is normotension. ASA 81mg  daily + plavix 75mg  daily x21 days f/b plavix 75mg  daily monotherapy after that.  Pharyngeal dysphagia 02-15-2023 due to CVA. On dysphagia 1(pureed) diet with nectar thick liquids.  02-16-2023 diet has been upgraded to dysphagia-2(chopped) and thin liquids. 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   02-16-2023 diet has been upgraded to dysphagia-2(chopped) and thin liquids. 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  Acute on chronic heart failure with preserved ejection fraction (HFpEF) (HCC) On admission, BNP elevated at  235, clinically appears volume overloaded with lower extremity edema and pulmonary vascular congestion. Prior echocardiogram with normal EF, indeterminate diastolic function.  Received a dose of IV Lasix. -Repeat echocardiogram ordered as stroke workup -Continue with IV Lasix 40 mg twice daily -Daily weight and BMP -Strict intake and output 02-15-2023 last recorded outpatient weight was in 12-2022 of 402 lbs. Today's weight 447 lbs. Continue with IV lasix bid 40 mg. Awaiting echo.  02-16-2023 echo shows normal LVEF 60-65%. Stop IV lasix due to hypernatremia.  Weight today is 487 lbs. Doubt he gained 40 lbs in 1 day while on IV lasix.   AKI (acute kidney injury) (HCC) On admission, Creatinine at 2 with baseline  appears to be at 1. -Patient is being diuresed -Monitor renal function -Avoid nephrotoxins 02-15-2023 ScR 1.53 today. Continue with IV lasix for acute on chronic diastolic CHF. Hold all other nephrotoxic agents for now until diuresis has been completed.  02-16-2023 Scr down to 1.31, BUN 24.  Na up to 146. Will need to stop diuresis. Encourage free water intake.  Microcytic anemia On admission, Hemoglobin at 10 with MCV of 78.  Baseline seems to be above 11, slowly decreasing.  No obvious bleeding.  Colonoscopy last year with removal of few polyps with negative pathology. -Check anemia panel -Start him on supplement -Outpatient follow-up with PCP  02-15-2023 HgB 9.8 today. Iron studies show iron deficiency.  Iron/TIBC/Ferritin/ %Sat    Component Value Date/Time   IRON 19 (L) 02/14/2023 1350   TIBC 410 02/14/2023 1350   FERRITIN 16 (L) 02/14/2023 1350   IRONPCTSAT 5 (L) 02/14/2023 1350    Essential hypertension On admission, Blood pressure currently within goal.  Patient was on torsemide, Coreg and valsartan at home. -Holding home antihypertensives for permissive hypertension. -Labetalol as needed for systolic above 200 02-15-2023 allow for permissive HTN for 24 more hours. Then work  on normotension tomorrow.  02-16-2023 restart BP meds today. Restart coreg 12.5 mg bid.  Obesity, Class III, BMI 40-49.9 (morbid obesity) (HCC) Estimated body mass index is 62.55 kg/m as calculated from the following:   Height as of this encounter: 6\' 2"  (1.88 m).   Weight as of this encounter: 221 kg.   -This will complicate overall prognosis -Encouraged weight loss  Acute on chronic respiratory failure with hypoxia and hypercapnia (HCC) - suppose to have home Trilogy machine since March 2015 On admission, Found to have hypoxia and hypercarbia.  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  Patient was using CPAP at home, not sure about the compliance.  He was placed on BiPAP in ED. chest x-ray with concern of pulmonary vascular congestion. uses 2 L of oxygen at baseline -Use BiPAP at night and while taking a nap. -Continue with supplemental oxygen-keep the saturation above 90% -Incentive spirometry 02-15-2023 pt states he is suppose to be on home O2 and Trilogy. He is not compliant with wearing home O2. Continue with nightly NIV. Review of EMR says that pt was qualified for home Trilogy after March 2015 admission.  02-16-2023 pt agrees that he still has his trilogy vent at home. I asked pt's sister Rivka Barbara to make sure that pt's girlfriend or sister(glenda) brings trilogy home  vent to hospital tonight so patient can use it.  Type 2 diabetes mellitus (HCC) On admission, Patient had 1 episode of hypoglycemia, he has not eaten since last night.  Uses a lot of insulin at home. -Continue to monitor -Semglee at 30 units twice daily which is half the home dose -Resistant SSI -Check A1c. 02-15-2023 has multiple low CBG this AM. Requiring IV D50. Hold lantus for now. Continue with SSI.  02-16-2023 CBG no longer low. On SSI. Still holding long acting insulin for now.  OSA treated with Trilogy machine 02-16-2023 pt agrees that he still has his trilogy vent at home. I asked pt's  sister Rivka Barbara to make sure that pt's girlfriend or sister(glenda) brings trilogy home vent to hospital tonight so patient can use it.    DVT prophylaxis: heparin injection 5,000 Units Start: 02/15/23 1400    Code Status: Full Code Family Communication: discussed with pt and sister glenda at bedside Disposition Plan: SNF Reason for continuing need for hospitalization: restarting HTN meds today.  Objective: Vitals:   02/16/23 0233 02/16/23 0632 02/16/23 0749 02/16/23 1157  BP: (!) 140/60 (!) 153/53 (!) 136/54 (!) 163/64  Pulse: 65 64 64 78  Resp:   19 18  Temp: 98.2 F (36.8 C) 99.1 F (37.3 C) 97.8 F (36.6 C) 98.4 F (36.9 C)  TempSrc: Oral     SpO2: 99% 96% 96% (!) 71%  Weight:      Height:        Intake/Output Summary (Last 24 hours) at 02/16/2023 1429 Last data filed at 02/16/2023 1057 Gross per 24 hour  Intake 240 ml  Output 7975 ml  Net -7735 ml   Filed Weights   02/14/23 0730 02/15/23 1600 02/15/23 2233  Weight: (!) 202.9 kg (!) 194.8 kg (!) 221 kg    Examination:  Physical Exam Vitals and nursing note reviewed.  Constitutional:      Appearance: He is obese.  HENT:     Head: Normocephalic and atraumatic.     Nose: Nose normal.  Eyes:     General: No scleral icterus. Cardiovascular:     Rate and Rhythm: Normal rate and regular rhythm.  Pulmonary:     Effort: Pulmonary effort is normal.  Abdominal:     General: Bowel sounds are normal.     Palpations: Abdomen is soft.  Skin:    General: Skin is warm and dry.  Neurological:     Mental Status: He is alert and oriented to person, place, and time.     Comments: Still with mild dysarthria Improving left LE strength. Able to hold up left LE against gravity for several seconds now.  Left hand grip still 4/5     Data Reviewed: I have personally reviewed following labs and imaging studies  CBC: Recent Labs  Lab 02/14/23 0650 02/15/23 0336  WBC 12.9* 10.4  NEUTROABS 7.7  --   HGB 10.0* 9.8*  HCT  36.9* 37.3*  MCV 78.8* 81.1  PLT 290 259   Basic Metabolic Panel: Recent Labs  Lab 02/14/23 0650 02/15/23 0336 02/16/23 0535  NA 143 145 146*  K 4.1 4.3 4.3  CL 102 101 101  CO2 31 33* 35*  GLUCOSE 97 52* 130*  BUN 30* 27* 24*  CREATININE 2.01* 1.53* 1.31*  CALCIUM 8.1* 8.1* 8.4*  MG  --   --  2.4   GFR: Estimated Creatinine Clearance: 118.3 mL/min (A) (by C-G formula based on SCr of 1.31 mg/dL (H)). Liver Function Tests:  Recent Labs  Lab 02/14/23 0650 02/16/23 0535  AST 15 12*  ALT 26 17  ALKPHOS 70 66  BILITOT 0.8 1.1  PROT 6.8 6.4*  ALBUMIN 3.3* 3.2*   Coagulation Profile: Recent Labs  Lab 02/14/23 0650  INR 1.1   BNP (last 3 results) Recent Labs    09/09/22 1516 02/14/23 0650 02/16/23 0535  BNP 33.4 231.3* 101.3*   HbA1C: Recent Labs    02/14/23 1350  HGBA1C 6.7*   CBG: Recent Labs  Lab 02/15/23 1213 02/15/23 1739 02/15/23 2051 02/16/23 0751 02/16/23 1142  GLUCAP 74 206* 170* 97 111*   Lipid Profile: Recent Labs    02/15/23 0336  CHOL 54  HDL 38*  LDLCALC 12  TRIG 20  CHOLHDL 1.4   Anemia Panel: Recent Labs    02/14/23 1350  VITAMINB12 789  FOLATE 15.3  FERRITIN 16*  TIBC 410  IRON 19*  RETICCTPCT 3.0    Radiology Studies: ECHOCARDIOGRAM COMPLETE Result Date: 02/15/2023    ECHOCARDIOGRAM REPORT   Patient Name:   Raymond [name] [facility] Date of Exam: 02/15/2023 Medical Rec #:  [id number]                   Height:       74.0 in Accession #:    [id number]                  Weight:       447.4 lb Date of Birth:  03-Sep-1965                   BSA:          3.060 m Patient Age:    57 years                    BP:           131/56 mmHg Patient Gender: M                           HR:           74 bpm. Exam Location:  [facility] Procedure: 2D Echo, Cardiac Doppler, Color Doppler and Intracardiac            Opacification Agent Indications:     Stroke  History:         Patient has prior history of Echocardiogram examinations, most                   recent 09/10/2022. CHF, Stroke and COPD; Risk                  Factors:Hypertension, Sleep Apnea, Diabetes and Dyslipidemia.  Sonographer:     Mikki Harbor Referring Phys:  [id number] SUMAYYA AMIN Diagnosing Phys: Windell Norfolk  Sonographer Comments: Technically challenging study due to limited acoustic windows and patient is obese. Image acquisition challenging due to COPD. IMPRESSIONS  1. Technically difficult study. Definity used to optimize the study.  2. Left ventricular ejection fraction, by estimation, is 55 to 60%. The left ventricle has normal function. The left ventricle has no regional wall motion abnormalities. There is moderate concentric left ventricular hypertrophy. Left ventricular diastolic parameters are indeterminate.  3. Right ventricular systolic function was not well visualized. The right ventricular size is not well visualized. There is normal pulmonary artery systolic pressure.  4. The mitral valve is normal in structure. Trivial mitral valve regurgitation.  5. The aortic valve is  tricuspid. Aortic valve regurgitation is not visualized. FINDINGS  Left Ventricle: Left ventricular ejection fraction, by estimation, is 55 to 60%. The left ventricle has normal function. The left ventricle has no regional wall motion abnormalities. Definity contrast agent was given IV to delineate the left ventricular  endocardial borders. The left ventricular internal cavity size was normal in size. There is moderate concentric left ventricular hypertrophy. Left ventricular diastolic parameters are indeterminate. Right Ventricle: The right ventricular size is not well visualized. Right vetricular wall thickness was not well visualized. Right ventricular systolic function was not well visualized. There is normal pulmonary artery systolic pressure. The tricuspid regurgitant velocity is 1.91 m/s, and with an assumed right atrial pressure of 8 mmHg, the estimated right ventricular systolic pressure is 22.6 mmHg.  Left Atrium: Left atrial size was not well visualized. Right Atrium: Right atrial size was not well visualized. Pericardium: There is no evidence of pericardial effusion. Mitral Valve: The mitral valve is normal in structure. Trivial mitral valve regurgitation. MV peak gradient, 4.5 mmHg. The mean mitral valve gradient is 2.0 mmHg. Tricuspid Valve: The tricuspid valve is normal in structure. Tricuspid valve regurgitation is trivial. Aortic Valve: The aortic valve is tricuspid. Aortic valve regurgitation is not visualized. Aortic valve mean gradient measures 3.0 mmHg. Aortic valve peak gradient measures 7.8 mmHg. Aortic valve area, by VTI measures 3.28 cm. Pulmonic Valve: The pulmonic valve was not well visualized. Pulmonic valve regurgitation is not visualized. Aorta: The aortic root is normal in size and structure. Venous: The inferior vena cava was not well visualized. IAS/Shunts: The interatrial septum was not well visualized.  LEFT VENTRICLE PLAX 2D LVIDd:         5.90 cm LVIDs:         4.30 cm LV PW:         1.70 cm LV IVS:        1.50 cm LVOT diam:     2.10 cm LV SV:         72 LV SV Index:   24 LVOT Area:     3.46 cm  LEFT ATRIUM         Index LA diam:    4.90 cm 1.60 cm/m  AORTIC VALVE                    PULMONIC VALVE AV Area (Vmax):    2.57 cm     PV Vmax:       1.30 m/s AV Area (Vmean):   3.05 cm     PV Peak grad:  6.8 mmHg AV Area (VTI):     3.28 cm AV Vmax:           140.00 cm/s AV Vmean:          79.900 cm/s AV VTI:            0.221 m AV Peak Grad:      7.8 mmHg AV Mean Grad:      3.0 mmHg LVOT Vmax:         104.00 cm/s LVOT Vmean:        70.300 cm/s LVOT VTI:          0.209 m LVOT/AV VTI ratio: 0.95  AORTA Ao Root diam: 3.70 cm MITRAL VALVE               TRICUSPID VALVE MV Area (PHT): 4.10 cm    TR Peak grad:   14.6 mmHg MV Area VTI:   3.16  cm    TR Vmax:        191.00 cm/s MV Peak grad:  4.5 mmHg MV Mean grad:  2.0 mmHg    SHUNTS MV Vmax:       1.06 m/s    Systemic VTI:  0.21 m MV Vmean:       57.9 cm/s   Systemic Diam: 2.10 cm MV Decel Time: 185 msec MV E velocity: 87.00 cm/s Windell Norfolk Electronically signed by Windell Norfolk Signature Date/Time: 02/15/2023/5:23:45 PM    Final     Scheduled Meds:  aspirin EC  81 mg Oral Daily   atorvastatin  40 mg Oral Daily   carvedilol  12.5 mg Oral BID WC   Chlorhexidine Gluconate Cloth  6 each Topical Daily   clopidogrel  75 mg Oral Daily   Fe Fum-Vit C-Vit B12-FA  1 capsule Oral BID   heparin injection (subcutaneous)  5,000 Units Subcutaneous Q8H   insulin aspart  0-20 Units Subcutaneous TID WC   insulin aspart  0-5 Units Subcutaneous QHS   Continuous Infusions:   LOS: 2 days   Time spent: 40 minutes  Carollee Herter, DO  [facility]  02/16/2023, 2:29 PM

## 2023-02-16 NOTE — Assessment & Plan Note (Addendum)
Continue trilogy machine

## 2023-02-16 NOTE — Plan of Care (Signed)
  Problem: Education: Goal: Ability to describe self-care measures that may prevent or decrease complications (Diabetes Survival Skills Education) will improve Outcome: Progressing   Problem: Coping: Goal: Ability to adjust to condition or change in health will improve Outcome: Progressing   Problem: Metabolic: Goal: Ability to maintain appropriate glucose levels will improve Outcome: Progressing   Problem: Nutritional: Goal: Maintenance of adequate nutrition will improve Outcome: Progressing   Problem: Skin Integrity: Goal: Risk for impaired skin integrity will decrease Outcome: Progressing   Problem: Tissue Perfusion: Goal: Adequacy of tissue perfusion will improve Outcome: Progressing   Problem: Education: Goal: Knowledge of disease or condition will improve Outcome: Progressing   Problem: Self-Care: Goal: Ability to participate in self-care as condition permits will improve Outcome: Progressing   Problem: Nutrition: Goal: Risk of aspiration will decrease Outcome: Progressing   Problem: Clinical Measurements: Goal: Ability to maintain clinical measurements within normal limits will improve Outcome: Progressing   Problem: Activity: Goal: Risk for activity intolerance will decrease Outcome: Progressing   Problem: Nutrition: Goal: Adequate nutrition will be maintained Outcome: Progressing   Problem: Coping: Goal: Level of anxiety will decrease Outcome: Progressing   Problem: Pain Management: Goal: General experience of comfort will improve Outcome: Progressing   Problem: Safety: Goal: Ability to remain free from injury will improve Outcome: Progressing   Problem: Skin Integrity: Goal: Risk for impaired skin integrity will decrease Outcome: Progressing

## 2023-02-16 NOTE — Evaluation (Signed)
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   Dysarthria due to acute cerebrovascular accident (CVA) (HCC) 02/14/2023   Acute on chronic heart failure with preserved ejection fraction (HFpEF) (HCC) 09/09/2022   Essential hypertension 09/09/2022   Type 2 diabetes mellitus without complications (HCC) 09/09/2022   Dyslipidemia 09/09/2022   GERD without esophagitis 09/09/2022   Osteomyelitis of second toe of right foot (HCC) 06/08/2022   Diabetic foot infection (HCC) 06/07/2022   Colon cancer screening 12/27/2021   Adenomatous polyp of colon 12/27/2021   Pneumonia due to COVID-19 virus 02/22/2020   Uncontrolled type 2 diabetes mellitus with hyperglycemia (HCC) 02/08/2020   Obesity, Class III, BMI 40-49.9 (morbid obesity) (HCC) 02/08/2020   Acute hypoxemic respiratory failure due to COVID-19 (HCC) 02/05/2020   Acute on chronic respiratory failure with hypoxia and hypercapnia (HCC) - suppose to have home Trilogy machine since March 2015 04/28/2017   AKI (acute kidney injury) (HCC) 04/28/2017   Hypotension 04/28/2017   PVD (peripheral vascular disease) (HCC) 04/28/2017   Hypertension 04/28/2017   Diabetes mellitus type 2, insulin dependent (HCC) 04/28/2017   OSA on CPAP 04/28/2017   Chronic diastolic heart failure, NYHA class 1 (HCC) 04/28/2017   Chest pain with moderate risk for cardiac etiology 04/28/2017   Morbid obesity with BMI of 45.0-49.9, adult (HCC) 04/28/2017   Lymphedema 02/11/2017   HTN (hypertension) 02/01/2017   Type 2 diabetes mellitus (HCC) 02/01/2017    Pneumonia 01/13/2017   Cellulitis of right foot 09/30/2014   COPD, moderate (HCC) 07/07/2014   Chronic diastolic heart failure (HCC) 05/06/2014   OSA treated with Trilogy machine 04/28/2014   Hypoxemia requiring supplemental oxygen 04/28/2014   Morbid obesity (HCC) 04/28/2014   Hyperlipidemia 07/09/2013   Past Medical History:  Past Medical History:  Diagnosis Date   Arthritis    hands   Cellulitis    CHF (congestive heart failure) (HCC)    Chronic diastolic heart failure (HCC)    Chronic respiratory failure with hypoxia (HCC)    2 L [place] continuously   Diabetes mellitus type 2, insulin dependent (HCC)    Edema    FEET/LEGS   GERD (gastroesophageal reflux disease)    Hepatic steatosis    HOH (hard of hearing)    Hypertension    Morbid obesity (HCC)    Morbid obesity with BMI of 45.0-49.9, adult (HCC)    Neuropathy    Orthopnea    OSA on CPAP    TRILOGY VENTILATOR   Oxygen deficiency    2L  HS   PVD (peripheral vascular disease) (HCC)    Shortness of breath dyspnea    Systolic heart failure (HCC)    Preserved EF 50-55%   Past Surgical History:  Past Surgical History:  Procedure Laterality Date   AMPUTATION Right 10/02/2014   Procedure: AMPUTATION RAY;  Surgeon: Recardo Evangelist, DPM;  Location: [facility] ORS;  Service: Podiatry;  Laterality: Right;   AMPUTATION TOE Right 06/09/2022   Procedure: AMPUTATION 2ND TOE RIGHT FOOT;  Surgeon: Candelaria Stagers, DPM;  Location: [facility] ORS;  Service: Podiatry;  Laterality: Right;   CATARACT EXTRACTION W/PHACO Right 09/10/2015   Procedure: CATARACT  EXTRACTION PHACO AND INTRAOCULAR LENS PLACEMENT (IOC);  Surgeon: Galen Manila, MD;  Location: [facility] ORS;  Service: Ophthalmology;  Laterality: Right;  [place] 01:34 AP% 19.3 CDE 18.36 Fluid pack lot # [id number]   CATARACT EXTRACTION W/PHACO Left 10/06/2015   Procedure: CATARACT EXTRACTION PHACO AND INTRAOCULAR LENS PLACEMENT (IOC);  Surgeon: Galen Manila, MD;  Location: [facility] ORS;  Service:  Ophthalmology;  Laterality: Left;  [place] 00:56 AP% 19.5 CDE 11.02 Fluid Pack # [id number]   CHOLECYSTECTOMY     COLONOSCOPY WITH PROPOFOL N/A 05/17/2016   Procedure: COLONOSCOPY WITH PROPOFOL;  Surgeon: Christena Deem, MD;  Location: [facility] ENDOSCOPY;  Service: Endoscopy;  Laterality: N/A;   COLONOSCOPY WITH PROPOFOL N/A 12/27/2021   Procedure: COLONOSCOPY WITH PROPOFOL;  Surgeon: Wyline Mood, MD;  Location: [facility] ENDOSCOPY;  Service: Gastroenterology;  Laterality: N/A;   EYE SURGERY Left    bilat laser   HIP SURGERY Right    8 th grade pins   LOWER EXTREMITY ANGIOGRAPHY Right 06/08/2022   Procedure: Lower Extremity Angiography;  Surgeon: Renford Dills, MD;  Location: [facility] INVASIVE CV LAB;  Service: Cardiovascular;  Laterality: Right;   NOSE SURGERY     PERIPHERAL VASCULAR CATHETERIZATION Right 10/02/2014   Procedure: Lower Extremity Angiography;  Surgeon: Annice Needy, MD;  Location: [facility] INVASIVE CV LAB;  Service: Cardiovascular;  Laterality: Right;   TOE AMPUTATION Left 2010   2cd   TOE AMPUTATION Right 2009   HPI:  Pt is a 58 y.o. male with medical history significant of morbid Obesity(BMI 57.44, 202.9kg), more Sedentary at home per report, type 2 diabetes, chronic HFpEF,  hypertension and OSA on CPAP came to ED with complaint of left-sided weakness and dysarthria.     Patient went to bed around 10 PM yesterday and was at baseline, this morning when he woke up he was having left-sided weakness and difficulty speaking so EMS was called.     Patient was also having some shortness of breath and confusion.  He was hypoxic at 75% with EMS.    MRI: Acute cortical and subcortical infarction in the right posterior  frontal lobe. Petechial blood products present in the region without  focal hematoma.  2. 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 Old left parietal cortical and  subcortical infarction. Few scattered old small vessel infarctions  in both hemispheres affecting the white matter.     CXR: Low lung  volumes. Cardiomegaly with findings suggestive of pulmonary  vascular congestion.   Assessment / Plan / Recommendation Clinical Impression  Pt seen for motor speech evaluation in the setting of acute CVA and secondary to pt's report of "slurred speech." Assessment consisting of pt/family interview and completion of dynamic assessment. Pt presents with grossly mild dysarthria, most notable in connected/conversational speech, and c/b reduced articulation precision, reduced respiratory support, hoarse vocal quality, and low vocal intensity. Intelligibility reduced to approximately 50-74% at conversational level. Pt/sister endorsed improvement in overall intelligibility compared to admission. Min verbal cues provided for retrial with increased volume and slower pace. Cognitive linguistic ability was not formally assessed, though basic expressive/receptive language and orientation was grossly intact. Questionable higher level attention deficits suspected based on impaired multi tasking and safety awareness warrants further assessment. Recommend continued assessment to determine approximation to baseline and follow up recommendations. Pt in agreement with stated plan.   Dysphagia intervention also completed, with trials completed of regular solids, pureed solids, and thin liquids. Increased time for mastication noted with regular solids. Education provided regarding oral motor weakness and impact  on mastication and ease of oral clearance. Liquid wash and extended time provided to facilitate oral clearance. No overt or subtle s/sx pharyngeal dysphagia noted. No change to vocal quality across trials. Recommend trial advancement to Dys 2 (chopped) and thin liquids. Based on oral motor weakness and current respiratory needs, pt continues to be at risk for aspiration. Recommend aspiration precautions (slow rate, small bites, elevated HOB, and alert for PO intake). MD and RN aware of diet recommendations.      SLP  Assessment  SLP Recommendation/Assessment: Patient needs continued Speech Lanaguage Pathology Services SLP Visit Diagnosis: Dysphagia, oropharyngeal phase (R13.12);Dysarthria and anarthria (R47.1);Cognitive communication deficit (R41.841)    Recommendations for follow up therapy are one component of a multi-disciplinary discharge planning process, led by the attending physician.  Recommendations may be updated based on patient status, additional functional criteria and insurance authorization.    Follow Up Recommendations  Follow physician's recommendations for discharge plan and follow up therapies    Assistance Recommended at Discharge  Intermittent Supervision/Assistance  Functional Status Assessment Patient has had a recent decline in their functional status and demonstrates the ability to make significant improvements in function in a reasonable and predictable amount of time.  Frequency and Duration min 2x/week  2 weeks      SLP Evaluation Cognition  Overall Cognitive Status: Impaired/Different from baseline Arousal/Alertness: Awake/alert Orientation Level: Oriented X4 Attention: Sustained Sustained Attention: Impaired Sustained Attention Impairment: Verbal basic (for completion of PO trials) Memory:  (will be further assessed) Awareness: Appears intact Problem Solving:  (will be further assessed) Executive Function:  (will be further assessed) Behaviors: Impulsive Safety/Judgment: Impaired (pt attempting to sit at edge of bed unasisted)       Comprehension  Auditory Comprehension Overall Auditory Comprehension: Appears within functional limits for tasks assessed Visual Recognition/Discrimination Discrimination: Not tested (glasses not present) Reading Comprehension Reading Status: Not tested (glasses not present)    Expression Expression Primary Mode of Expression: Verbal Verbal Expression Overall Verbal Expression: Appears within functional limits for tasks  assessed Written Expression Dominant Hand: Left Written Expression: Not tested   Oral / Motor  Oral Motor/Sensory Function Overall Oral Motor/Sensory Function: Moderate impairment Facial ROM: Reduced left;Suspected CN VII (facial) dysfunction Facial Symmetry: Abnormal symmetry left;Suspected CN VII (facial) dysfunction Facial Strength: Reduced left;Suspected CN VII (facial) dysfunction Facial Sensation: Reduced left;Suspected CN V (Trigeminal) dysfunction Lingual ROM: Other (Comment) (pt achieving lateralization) Lingual Symmetry:  (grossly intact with protrusion) Velum: Within Functional Limits Mandible: Within Functional Limits Motor Speech Overall Motor Speech: Impaired Respiration: Impaired Level of Impairment: Sentence Phonation: Hoarse Resonance:  (grossly intact) Articulation: Impaired Level of Impairment: Sentence Intelligibility: Intelligibility reduced Word: 75-100% accurate Phrase: 75-100% accurate Sentence: 50-74% accurate Conversation: 50-74% accurate Motor Planning:  (grossly intact) Motor Speech Errors: Not applicable Effective Techniques: Slow rate;Increased vocal intensity           Raymond Racheal Mathurin Clapp  MS [facility] SLP   Raymond [name] 02/16/2023, 10:14 AM

## 2023-02-16 NOTE — Progress Notes (Signed)
Physical Therapy Treatment Patient Details Name: Raymond [name] MRN: [id number] DOB: [birth year] Today's Date: 02/16/2023   History of Present Illness Pt is a 58 year old male presented to the ED with L sided weakness and dysarthria, MRI showing  Acute cortical and subcortical infarction in the right posterior  frontal lobe. 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 Few scattered old small vessel infarctions  in both hemispheres affecting the white matter.    PMH significant for morbid obesity, type 2 diabetes, chronic HFpEF,  hypertension and OSA on CPAP    PT Comments  On arrival pt was quick to let it be known that he did not like his new pressure relief bed and that he needed help to reposition (despite being relatively appropriately positioned).  He perseverated on this and we did, with much effort, cuing, bed positioning, etc get scooted up and repositioned more to his liking though he still complained of the bed (despite improvement and looking very appropriate - nursing aware.)   Pt was able to do a few rounds of LE exercises but flatly refused to attempt sitting up or any further mobility beyond adjusting in the bed.  Pt will benefit from ongoing PT, continue with POC.    If plan is discharge home, recommend the following: Two people to help with walking and/or transfers;Two people to help with bathing/dressing/bathroom;Assistance with cooking/housework;Assist for transportation;Help with stairs or ramp for entrance   Can travel by private vehicle     No  Equipment Recommendations   (TBD)    Recommendations for Other Services       Precautions / Restrictions Precautions Precautions: Fall Restrictions Weight Bearing Restrictions Per Provider Order: No     Mobility  Bed Mobility Overal bed mobility: Needs Assistance Bed Mobility:  (scooting up in  bed)           General bed mobility comments: pt very vocal bout not liking his new bed.  Despite being in a relatively good position he was adamant about scooting his head up.  PT facilitated with cuing, phyiscal assist and bed positioning but did encouarge and make him use both LE and UEs to help re-adjust in bed.  Which he did with much effort and some frustration - though ultimately did improve his posture/positioning    Transfers                        Ambulation/Gait                   Stairs             Wheelchair Mobility     Tilt Bed    Modified Rankin (Stroke Patients Only)       Balance                                            Cognition Arousal: Alert   Overall Cognitive Status: Within Functional Limits for tasks assessed                                 General Comments: Pt able to form words better today/more easily understood.  However he is generally agitated about his situation and perseverated on  not liking his new bed (pressure relief)        Exercises General Exercises - Lower Extremity Ankle Circles/Pumps: AROM, 10 reps Quad Sets: Strengthening, 10 reps Short Arc Quad: AROM, 5 reps Heel Slides: AAROM, Strengthening, 5 reps (with resisted leg ext) Hip ABduction/ADduction: Strengthening, AROM, 10 reps Straight Leg Raises: AAROM, 5 reps    General Comments        Pertinent Vitals/Pain Pain Assessment Pain Assessment: 0-10 Faces Pain Scale: Hurts even more Pain Location: shoulder    Home Living                          Prior Function            PT Goals (current goals can now be found in the care plan section) Progress towards PT goals: Progressing toward goals    Frequency    Min 1X/week      PT Plan      Co-evaluation              AM-PAC PT "6 Clicks" Mobility   Outcome Measure  Help needed turning from your back to your side while in a flat  bed without using bedrails?: A Lot Help needed moving from lying on your back to sitting on the side of a flat bed without using bedrails?: Total Help needed moving to and from a bed to a chair (including a wheelchair)?: Total Help needed standing up from a chair using your arms (e.g., wheelchair or bedside chair)?: Total Help needed to walk in hospital room?: Total Help needed climbing 3-5 steps with a railing? : Total 6 Click Score: 7    End of Session   Activity Tolerance: Patient tolerated treatment well;Patient limited by fatigue Patient left: with bed alarm set;with call bell/phone within reach;with nursing/sitter in room Nurse Communication: Mobility status PT Visit Diagnosis: Muscle weakness (generalized) (M62.81);Difficulty in walking, not elsewhere classified (R26.2);Unsteadiness on feet (R26.81);Hemiplegia and hemiparesis;Other symptoms and signs involving the nervous system (R29.898) Hemiplegia - Right/Left: Left Hemiplegia - dominant/non-dominant: Dominant Hemiplegia - caused by: Cerebral infarction     Time: 1610-9604 PT Time Calculation (min) (ACUTE ONLY): 29 min  Charges:    $Therapeutic Exercise: 8-22 mins PT General Charges $$ ACUTE PT VISIT: 1 Visit                     Malachi Pro, DPT 02/16/2023, 4:24 PM

## 2023-02-16 NOTE — TOC Progression Note (Signed)
Transition of Care [facility]) - Progression Note    Patient Details  Name: Marten Marchak MRN: [id number] Date of Birth: 08-13-1965  [facility] CM/SW Contact  Garret Reddish, RN Phone Number: 02/16/2023, 12:13 PM  Clinical Narrative:    Chart reviewed.  No bed offers at this time.   TOC will continue to follow for discharge planning.     Expected Discharge Plan: Skilled Nursing Facility Barriers to Discharge: Continued Medical Work up  Expected Discharge Plan and Services     Post Acute Care Choice: Skilled Nursing Facility Living arrangements for the past 2 months: Apartment                                       Social Determinants of Health (SDOH) Interventions SDOH Screenings   Food Insecurity: No Food Insecurity (02/15/2023)  Housing: Unknown (02/15/2023)  Transportation Needs: No Transportation Needs (02/15/2023)  Utilities: Not At Risk (02/15/2023)  Tobacco Use: Low Risk  (02/14/2023)    Readmission Risk Interventions     No data to display

## 2023-02-17 DIAGNOSIS — N179 Acute kidney failure, unspecified: Secondary | ICD-10-CM | POA: Diagnosis not present

## 2023-02-17 DIAGNOSIS — I639 Cerebral infarction, unspecified: Secondary | ICD-10-CM | POA: Diagnosis not present

## 2023-02-17 DIAGNOSIS — I69952 Hemiplegia and hemiparesis following unspecified cerebrovascular disease affecting left dominant side: Secondary | ICD-10-CM | POA: Diagnosis not present

## 2023-02-17 DIAGNOSIS — I5033 Acute on chronic diastolic (congestive) heart failure: Secondary | ICD-10-CM | POA: Diagnosis not present

## 2023-02-17 LAB — CBC WITH DIFFERENTIAL/PLATELET
Abs Immature Granulocytes: 0.06 10*3/uL (ref 0.00–0.07)
Basophils Absolute: 0.1 10*3/uL (ref 0.0–0.1)
Basophils Relative: 0 %
Eosinophils Absolute: 0.4 10*3/uL (ref 0.0–0.5)
Eosinophils Relative: 3 %
HCT: 38.3 % — ABNORMAL LOW (ref 39.0–52.0)
Hemoglobin: 10.5 g/dL — ABNORMAL LOW (ref 13.0–17.0)
Immature Granulocytes: 1 %
Lymphocytes Relative: 16 %
Lymphs Abs: 2 10*3/uL (ref 0.7–4.0)
MCH: 21.2 pg — ABNORMAL LOW (ref 26.0–34.0)
MCHC: 27.4 g/dL — ABNORMAL LOW (ref 30.0–36.0)
MCV: 77.2 fL — ABNORMAL LOW (ref 80.0–100.0)
Monocytes Absolute: 1.1 10*3/uL — ABNORMAL HIGH (ref 0.1–1.0)
Monocytes Relative: 9 %
Neutro Abs: 8.4 10*3/uL — ABNORMAL HIGH (ref 1.7–7.7)
Neutrophils Relative %: 71 %
Platelets: 306 10*3/uL (ref 150–400)
RBC: 4.96 MIL/uL (ref 4.22–5.81)
RDW: 18.2 % — ABNORMAL HIGH (ref 11.5–15.5)
WBC: 12 10*3/uL — ABNORMAL HIGH (ref 4.0–10.5)
nRBC: 0 % (ref 0.0–0.2)

## 2023-02-17 LAB — COMPREHENSIVE METABOLIC PANEL
ALT: 16 U/L (ref 0–44)
AST: 13 U/L — ABNORMAL LOW (ref 15–41)
Albumin: 3.5 g/dL (ref 3.5–5.0)
Alkaline Phosphatase: 71 U/L (ref 38–126)
Anion gap: 12 (ref 5–15)
BUN: 22 mg/dL — ABNORMAL HIGH (ref 6–20)
CO2: 32 mmol/L (ref 22–32)
Calcium: 8.9 mg/dL (ref 8.9–10.3)
Chloride: 102 mmol/L (ref 98–111)
Creatinine, Ser: 1.16 mg/dL (ref 0.61–1.24)
GFR, Estimated: >60 mL/min (ref >60)
Glucose, Bld: 146 mg/dL — ABNORMAL HIGH (ref 70–99)
Potassium: 4.7 mmol/L (ref 3.5–5.1)
Sodium: 146 mmol/L — ABNORMAL HIGH (ref 135–145)
Total Bilirubin: 1.6 mg/dL — ABNORMAL HIGH (ref 0.0–1.2)
Total Protein: 7 g/dL (ref 6.5–8.1)

## 2023-02-17 LAB — MAGNESIUM: Magnesium: 2.3 mg/dL (ref 1.7–2.4)

## 2023-02-17 LAB — GLUCOSE, CAPILLARY
Glucose-Capillary: 143 mg/dL — ABNORMAL HIGH (ref 70–99)
Glucose-Capillary: 140 mg/dL — ABNORMAL HIGH (ref 70–99)
Glucose-Capillary: 179 mg/dL — ABNORMAL HIGH (ref 70–99)
Glucose-Capillary: 196 mg/dL — ABNORMAL HIGH (ref 70–99)

## 2023-02-17 MED ORDER — CARVEDILOL 25 MG PO TABS
25.0000 mg | ORAL_TABLET | Freq: Two times a day (BID) | ORAL | Status: DC
Start: 2023-02-17 — End: 2023-02-27
  Administered 2023-02-17 – 2023-02-27 (×19): 25 mg via ORAL
  Filled 2023-02-17 (×21): qty 1

## 2023-02-17 MED ORDER — ISOSORBIDE MONONITRATE ER 30 MG PO TB24
60.0000 mg | ORAL_TABLET | Freq: Every day | ORAL | Status: DC
  Administered 2023-02-17 – 2023-02-26 (×10): 60 mg via ORAL
  Filled 2023-02-17 (×10): qty 2

## 2023-02-17 MED ORDER — PANTOPRAZOLE SODIUM 40 MG PO TBEC
40.0000 mg | DELAYED_RELEASE_TABLET | Freq: Every day | ORAL | Status: DC
Start: 2023-02-17 — End: 2023-02-27
  Administered 2023-02-17 – 2023-02-27 (×11): 40 mg via ORAL
  Filled 2023-02-17 (×11): qty 1

## 2023-02-17 NOTE — Plan of Care (Signed)
  Problem: Coping: Goal: Ability to adjust to condition or change in health will improve Outcome: Progressing   Problem: Metabolic: Goal: Ability to maintain appropriate glucose levels will improve Outcome: Progressing   Problem: Nutritional: Goal: Maintenance of adequate nutrition will improve Outcome: Progressing   Problem: Skin Integrity: Goal: Risk for impaired skin integrity will decrease Outcome: Progressing   Problem: Tissue Perfusion: Goal: Adequacy of tissue perfusion will improve Outcome: Progressing   Problem: Coping: Goal: Will verbalize positive feelings about self Outcome: Progressing   Problem: Self-Care: Goal: Ability to participate in self-care as condition permits will improve Outcome: Progressing   Problem: Nutrition: Goal: Risk of aspiration will decrease Outcome: Progressing   Problem: Activity: Goal: Risk for activity intolerance will decrease Outcome: Progressing   Problem: Pain Management: Goal: General experience of comfort will improve Outcome: Progressing   Problem: Safety: Goal: Ability to remain free from injury will improve Outcome: Progressing   Problem: Skin Integrity: Goal: Risk for impaired skin integrity will decrease Outcome: Progressing

## 2023-02-17 NOTE — Plan of Care (Addendum)
Patient is alert and oriented X 4. He looks fatigue, oxygen is going on at the rate of 5 lit/min. Denies any pain.   Problem: Education: Goal: Ability to describe self-care measures that may prevent or decrease complications (Diabetes Survival Skills Education) will improve Outcome: Progressing Goal: Individualized Educational Video(s) Outcome: Progressing   Problem: Coping: Goal: Ability to adjust to condition or change in health will improve Outcome: Progressing   Problem: Fluid Volume: Goal: Ability to maintain a balanced intake and output will improve Outcome: Progressing   Problem: Health Behavior/Discharge Planning: Goal: Ability to identify and utilize available resources and services will improve Outcome: Progressing Goal: Ability to manage health-related needs will improve Outcome: Progressing   Problem: Metabolic: Goal: Ability to maintain appropriate glucose levels will improve Outcome: Progressing   Problem: Nutritional: Goal: Maintenance of adequate nutrition will improve Outcome: Progressing Goal: Progress toward achieving an optimal weight will improve Outcome: Progressing   Problem: Skin Integrity: Goal: Risk for impaired skin integrity will decrease Outcome: Progressing   Problem: Tissue Perfusion: Goal: Adequacy of tissue perfusion will improve Outcome: Progressing   Problem: Education: Goal: Knowledge of disease or condition will improve Outcome: Progressing Goal: Knowledge of secondary prevention will improve (MUST DOCUMENT ALL) Outcome: Progressing Goal: Knowledge of patient specific risk factors will improve [name] N/A or DELETE if not current risk factor) Outcome: Progressing   Problem: Ischemic Stroke/TIA Tissue Perfusion: Goal: Complications of ischemic stroke/TIA will be minimized Outcome: Progressing   Problem: Coping: Goal: Will verbalize positive feelings about self Outcome: Progressing Goal: Will identify appropriate support  needs Outcome: Progressing   Problem: Health Behavior/Discharge Planning: Goal: Ability to manage health-related needs will improve Outcome: Progressing Goal: Goals will be collaboratively established with patient/family Outcome: Progressing   Problem: Self-Care: Goal: Ability to participate in self-care as condition permits will improve Outcome: Progressing Goal: Verbalization of feelings and concerns over difficulty with self-care will improve Outcome: Progressing Goal: Ability to communicate needs accurately will improve Outcome: Progressing   Problem: Nutrition: Goal: Risk of aspiration will decrease Outcome: Progressing Goal: Dietary intake will improve Outcome: Progressing   Problem: Education: Goal: Knowledge of General Education information will improve Description: Including pain rating scale, medication(s)/side effects and non-pharmacologic comfort measures Outcome: Progressing   Problem: Health Behavior/Discharge Planning: Goal: Ability to manage health-related needs will improve Outcome: Progressing   Problem: Clinical Measurements: Goal: Ability to maintain clinical measurements within normal limits will improve Outcome: Progressing Goal: Will remain free from infection Outcome: Progressing Goal: Diagnostic test results will improve Outcome: Progressing Goal: Respiratory complications will improve Outcome: Progressing Goal: Cardiovascular complication will be avoided Outcome: Progressing   Problem: Activity: Goal: Risk for activity intolerance will decrease Outcome: Progressing   Problem: Nutrition: Goal: Adequate nutrition will be maintained Outcome: Progressing   Problem: Coping: Goal: Level of anxiety will decrease Outcome: Progressing   Problem: Elimination: Goal: Will not experience complications related to bowel motility Outcome: Progressing Goal: Will not experience complications related to urinary retention Outcome: Progressing    Problem: Pain Management: Goal: General experience of comfort will improve Outcome: Progressing   Problem: Safety: Goal: Ability to remain free from injury will improve Outcome: Progressing   Problem: Skin Integrity: Goal: Risk for impaired skin integrity will decrease Outcome: Progressing

## 2023-02-17 NOTE — Plan of Care (Signed)
  Problem: Education: Goal: Ability to describe self-care measures that may prevent or decrease complications (Diabetes Survival Skills Education) will improve Outcome: Progressing   Problem: Coping: Goal: Ability to adjust to condition or change in health will improve Outcome: Progressing   Problem: Metabolic: Goal: Ability to maintain appropriate glucose levels will improve Outcome: Progressing   Problem: Nutritional: Goal: Maintenance of adequate nutrition will improve Outcome: Progressing   Problem: Skin Integrity: Goal: Risk for impaired skin integrity will decrease Outcome: Progressing   Problem: Tissue Perfusion: Goal: Adequacy of tissue perfusion will improve Outcome: Progressing   Problem: Education: Goal: Knowledge of disease or condition will improve Outcome: Progressing   Problem: Ischemic Stroke/TIA Tissue Perfusion: Goal: Complications of ischemic stroke/TIA will be minimized Outcome: Progressing   Problem: Self-Care: Goal: Ability to participate in self-care as condition permits will improve Outcome: Progressing   Problem: Nutrition: Goal: Risk of aspiration will decrease Outcome: Progressing   Problem: Activity: Goal: Risk for activity intolerance will decrease Outcome: Progressing   Problem: Nutrition: Goal: Adequate nutrition will be maintained Outcome: Progressing   Problem: Pain Management: Goal: General experience of comfort will improve Outcome: Progressing   Problem: Safety: Goal: Ability to remain free from injury will improve Outcome: Progressing   Problem: Skin Integrity: Goal: Risk for impaired skin integrity will decrease Outcome: Progressing

## 2023-02-17 NOTE — Progress Notes (Signed)
PROGRESS NOTE    Raymond [name]  NWG:[id number] DOB: [birth year] DOA: 02/14/2023 PCP: Martie Round, NP  Subjective: Pt seen and examined.  Pt's home vent Trilogy was brought to his room.  Awaiting SNF placement.   Hospital Course: HPI: Raymond [name] is a 59 y.o. male with medical history significant of morbid obesity, type 2 diabetes, chronic HFpEF,  hypertension and OSA on CPAP came to ED with complaint of left-sided weakness and dysarthria.   Patient went to bed around 10 PM yesterday and was at baseline, this morning when he woke up he was having left-sided weakness and difficulty speaking so EMS was called.   Patient was also having some shortness of breath and confusion.  He was hypoxic at 75% with EMS.  Brought in as code stroke.  Teleneurology was consulted.  He was out of window for thrombolytics.   At baseline patient morbidly obese and does not move around a lot.  Per family he was using CPAP at night.   Patient denies any recent illnesses, chest pain.  He does have some orthopnea and exertional dyspnea.  Some worsening lower extremity edema.  No recent change in appetite or bowel habits.  No urinary symptoms.  Significant Events: Admitted 02/14/2023 for acute CVA   Significant Labs: VBG pH 7.23, PCO2 of 79 WBC 12.9, Hgb 10, sCr 2.01, BUN 30, BNP 231  Significant Imaging Studies:  Chest x-ray with low lung volume, cardiomegaly and concern of pulmonary vascular congestion.  CT head with chronic infarcts with no acute abnormality.  CTA head and neck with no LVO   Antibiotic Therapy: Anti-infectives (From admission, onward)    None       Procedures: 02-14-2023 started on bipap  Consultants: Neurology    Assessment and Plan: * Acute ischemic stroke (HCC) On admission, Presented with left-sided weakness and some dysarthria when he woke up this morning, LKW around 10 PM last night, out of window for thrombolytic.  Initial CT head  was negative for any acute abnormality, CTA negative for LVO.  Neurology was consulted. Continue to have left sided weakness. -Admit to progressive care -MRI brain-body habitus might be a challenge. -Lipid profile and A1c -PT and OT evaluation -Echocardiogram -Aspirin and Plavix as DAPT, if MRI is negative and symptoms resolved then we can discontinue Plavix. -Continue home Lipitor 02-15-2023 MRI brain confirms Acute cortical and subcortical infarction in the right posterior frontal lobe. Petechial blood products present in the region without focal hematoma. Pt started on DAPT. I Have let [facility] know that he will need ambulatory cardiac monitor. 02-16-2023 restart BP meds. Goal is normotension. ASA 81mg  daily + plavix 75mg  daily x21 days f/b plavix 75mg  daily monotherapy after that.  02-17-2023 coreg increased to 25 mg bid. Will start imdur 60 mg at bedtime tonight. Continue with asa/plavix for 3 weeks, then with plavix 75 mg daily. Will need SNF placement.  Pharyngeal dysphagia 02-15-2023 due to CVA. On dysphagia 1(pureed) diet with nectar thick liquids. 02-16-2023 diet has been upgraded to dysphagia-2(chopped) and thin liquids. Discussed with pt and family that pt will continue to have issues with memory/speech/language as he recovers from stroke   02-17-2023 stable.   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  02-16-2023 diet has been upgraded to dysphagia-2(chopped) and thin liquids. Discussed with pt and family that pt will continue to have issues with memory/speech/language as he recovers  from stroke  02-17-2023 stable.  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  02-17-2023 stable.  Acute on chronic heart failure with preserved ejection fraction (HFpEF) (HCC) On admission, BNP elevated at 235, clinically appears volume  overloaded with lower extremity edema and pulmonary vascular congestion. Prior echocardiogram with normal EF, indeterminate diastolic function.  Received a dose of IV Lasix. -Repeat echocardiogram ordered as stroke workup -Continue with IV Lasix 40 mg twice daily -Daily weight and BMP -Strict intake and output 02-15-2023 last recorded outpatient weight was in 12-2022 of 402 lbs. Today's weight 447 lbs. Continue with IV lasix bid 40 mg. Awaiting echo. 02-16-2023 echo shows normal LVEF 60-65%. Stop IV lasix due to hypernatremia.  Weight today is 487 lbs. Doubt he gained 40 lbs in 1 day while on IV lasix.  02-17-2023 resolved. Scr back down to 1.16. will start imdur 60 mg at bedtime. Coreg increased to 25 mg bid. Weight 453 today. Doubt he lost 30 lbs in 1 day. Possibly restart low dose ARB tomorrow if Scr remains stable.   AKI (acute kidney injury) (HCC) On admission, Creatinine at 2 with baseline  appears to be at 1. -Patient is being diuresed -Monitor renal function -Avoid nephrotoxins 02-15-2023 ScR 1.53 today. Continue with IV lasix for acute on chronic diastolic CHF. Hold all other nephrotoxic agents for now until diuresis has been completed. 02-16-2023 Scr down to 1.31, BUN 24.  Na up to 146. Will need to stop diuresis. Encourage free water intake.  02-17-2023 Scr back down to 1.16. hold on starting ARB to give more time for kidneys to recover.  Microcytic anemia On admission, Hemoglobin at 10 with MCV of 78.  Baseline seems to be above 11, slowly decreasing.  No obvious bleeding.  Colonoscopy last year with removal of few polyps with negative pathology. -Check anemia panel -Start him on supplement -Outpatient follow-up with PCP  02-15-2023 HgB 9.8 today. Iron studies show iron deficiency.  Iron/TIBC/Ferritin/ %Sat    Component Value Date/Time   IRON 19 (L) 02/14/2023 1350   TIBC 410 02/14/2023 1350   FERRITIN 16 (L) 02/14/2023 1350   IRONPCTSAT 5 (L) 02/14/2023 1350    Essential  hypertension On admission, Blood pressure currently within goal.  Patient was on torsemide, Coreg and valsartan at home. -Holding home antihypertensives for permissive hypertension. -Labetalol as needed for systolic above 200 02-15-2023 allow for permissive HTN for 24 more hours. Then work on normotension tomorrow. 02-16-2023 restart BP meds today. Restart coreg 12.5 mg bid.  02-17-2023 BP higher today. Increase coreg to 25 mg bid. Add at bedtime imdur 60 mg  Obesity, Class III, BMI 40-49.9 (morbid obesity) (HCC) Estimated body mass index is 62.55 kg/m as calculated from the following:   Height as of this encounter: 6\' 2"  (1.88 m).   Weight as of this encounter: 221 kg.   -This will complicate overall prognosis -Encouraged weight loss  Acute on chronic respiratory failure with hypoxia and hypercapnia (HCC) - suppose to have home Trilogy machine since March 2015 On admission, Found to have hypoxia and hypercarbia.  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  Patient was using CPAP at home, not sure about the compliance.  He was placed on BiPAP in ED. chest x-ray with concern of pulmonary vascular congestion. uses 2 L of oxygen at baseline -Use BiPAP at night and while taking a nap. -Continue with supplemental oxygen-keep the saturation above 90% -Incentive spirometry 02-15-2023 pt states he is  suppose to be on home O2 and Trilogy. He is not compliant with wearing home O2. Continue with nightly NIV. Review of EMR says that pt was qualified for home Trilogy after March 2015 admission. 02-16-2023 pt agrees that he still has his trilogy vent at home. I asked pt's sister Rivka Barbara to make sure that pt's girlfriend or sister(glenda) brings trilogy home vent to hospital tonight so patient can use it.  02-17-2023 continue with home trilogy vent. Now that his family has brought it to hospital.  Type 2 diabetes mellitus (HCC) On admission, Patient had 1 episode of hypoglycemia, he  has not eaten since last night.  Uses a lot of insulin at home. -Continue to monitor -Semglee at 30 units twice daily which is half the home dose -Resistant SSI -Check A1c. 02-15-2023 has multiple low CBG this AM. Requiring IV D50. Hold lantus for now. Continue with SSI. 02-16-2023 CBG no longer low. On SSI. Still holding long acting insulin for now.  02-17-2023 CBG in 120-140s. Continue with SSI.  OSA treated with Trilogy machine 02-16-2023 pt agrees that he still has his trilogy vent at home. I asked pt's sister Rivka Barbara to make sure that pt's girlfriend or sister(glenda) brings trilogy home vent to hospital tonight so patient can use it.   02-17-2023 use home trilogy vent at night and while sleeping.   DVT prophylaxis: heparin injection 5,000 Units Start: 02/15/23 1400    Code Status: Full Code Family Communication: no family at bedside Disposition Plan: SNF Reason for continuing need for hospitalization: titration of BP meds  Objective: Vitals:   02/17/23 0445 02/17/23 0448 02/17/23 0742 02/17/23 1151  BP: (!) 158/57  (!) 166/61 (!) 153/59  Pulse: 79 73 81 70  Resp: 20  17 17   Temp: 99.5 F (37.5 C)  99.6 F (37.6 C) 99.4 F (37.4 C)  TempSrc: Oral     SpO2: 93%  92% 92%  Weight:   (!) 205.6 kg   Height:        Intake/Output Summary (Last 24 hours) at 02/17/2023 1348 Last data filed at 02/17/2023 1029 Gross per 24 hour  Intake --  Output 6300 ml  Net -6300 ml   Filed Weights   02/15/23 1600 02/15/23 2233 02/17/23 0742  Weight: (!) 194.8 kg (!) 221 kg (!) 205.6 kg    Examination:  Physical Exam Vitals and nursing note reviewed.  Constitutional:      Appearance: He is obese.  HENT:     Head: Normocephalic and atraumatic.     Nose: Nose normal.  Cardiovascular:     Rate and Rhythm: Normal rate and regular rhythm.  Pulmonary:     Effort: Pulmonary effort is normal.  Abdominal:     General: Abdomen is protuberant. Bowel sounds are normal.     Palpations:  Abdomen is soft.  Musculoskeletal:     Comments: Lymphedema both lower legs  Skin:    Capillary Refill: Capillary refill takes less than 2 seconds.  Neurological:     Mental Status: He is alert and oriented to person, place, and time.     Comments: Mild dysarthria. Unchanged.     Data Reviewed: I have personally reviewed following labs and imaging studies  CBC: Recent Labs  Lab 02/14/23 0650 02/15/23 0336 02/17/23 0548  WBC 12.9* 10.4 12.0*  NEUTROABS 7.7  --  8.4*  HGB 10.0* 9.8* 10.5*  HCT 36.9* 37.3* 38.3*  MCV 78.8* 81.1 77.2*  PLT 290 259 306   Basic Metabolic  Panel: Recent Labs  Lab 02/14/23 0650 02/15/23 0336 02/16/23 0535 02/17/23 0548  NA 143 145 146* 146*  K 4.1 4.3 4.3 4.7  CL 102 101 101 102  CO2 31 33* 35* 32  GLUCOSE 97 52* 130* 146*  BUN 30* 27* 24* 22*  CREATININE 2.01* 1.53* 1.31* 1.16  CALCIUM 8.1* 8.1* 8.4* 8.9  MG  --   --  2.4 2.3   GFR: Estimated Creatinine Clearance: 127.6 mL/min (by C-G formula based on SCr of 1.16 mg/dL). Liver Function Tests: Recent Labs  Lab 02/14/23 0650 02/16/23 0535 02/17/23 0548  AST 15 12* 13*  ALT 26 17 16   ALKPHOS 70 66 71  BILITOT 0.8 1.1 1.6*  PROT 6.8 6.4* 7.0  ALBUMIN 3.3* 3.2* 3.5   Coagulation Profile: Recent Labs  Lab 02/14/23 0650  INR 1.1    BNP (last 3 results) Recent Labs    09/09/22 1516 02/14/23 0650 02/16/23 0535  BNP 33.4 231.3* 101.3*   HbA1C: Recent Labs    02/14/23 1350  HGBA1C 6.7*   CBG: Recent Labs  Lab 02/16/23 1142 02/16/23 1642 02/16/23 2211 02/17/23 0740 02/17/23 1148  GLUCAP 111* 104* 123* 143* 140*   Lipid Profile: Recent Labs    02/15/23 0336  CHOL 54  HDL 38*  LDLCALC 12  TRIG 20  CHOLHDL 1.4   Anemia Panel: Recent Labs    02/14/23 1350  VITAMINB12 789  FOLATE 15.3  FERRITIN 16*  TIBC 410  IRON 19*  RETICCTPCT 3.0    Radiology Studies: ECHOCARDIOGRAM COMPLETE Result Date: 02/15/2023    ECHOCARDIOGRAM REPORT   Patient Name:    COCHISE WESP [facility] Date of Exam: 02/15/2023 Medical Rec #:  [id number]                   Height:       74.0 in Accession #:    [id number]                  Weight:       447.4 lb Date of Birth:  1965/09/06                   BSA:          3.060 m Patient Age:    57 years                    BP:           131/56 mmHg Patient Gender: M                           HR:           74 bpm. Exam Location:  [facility] Procedure: 2D Echo, Cardiac Doppler, Color Doppler and Intracardiac            Opacification Agent Indications:     Stroke  History:         Patient has prior history of Echocardiogram examinations, most                  recent 09/10/2022. CHF, Stroke and COPD; Risk                  Factors:Hypertension, Sleep Apnea, Diabetes and Dyslipidemia.  Sonographer:     Mikki Harbor Referring Phys:  [id number] SUMAYYA AMIN Diagnosing Phys: Windell Norfolk  Sonographer Comments: Technically challenging study due to limited acoustic windows and patient is obese. Image acquisition  challenging due to COPD. IMPRESSIONS  1. Technically difficult study. Definity used to optimize the study.  2. Left ventricular ejection fraction, by estimation, is 55 to 60%. The left ventricle has normal function. The left ventricle has no regional wall motion abnormalities. There is moderate concentric left ventricular hypertrophy. Left ventricular diastolic parameters are indeterminate.  3. Right ventricular systolic function was not well visualized. The right ventricular size is not well visualized. There is normal pulmonary artery systolic pressure.  4. The mitral valve is normal in structure. Trivial mitral valve regurgitation.  5. The aortic valve is tricuspid. Aortic valve regurgitation is not visualized. FINDINGS  Left Ventricle: Left ventricular ejection fraction, by estimation, is 55 to 60%. The left ventricle has normal function. The left ventricle has no regional wall motion abnormalities. Definity contrast agent was given IV to  delineate the left ventricular  endocardial borders. The left ventricular internal cavity size was normal in size. There is moderate concentric left ventricular hypertrophy. Left ventricular diastolic parameters are indeterminate. Right Ventricle: The right ventricular size is not well visualized. Right vetricular wall thickness was not well visualized. Right ventricular systolic function was not well visualized. There is normal pulmonary artery systolic pressure. The tricuspid regurgitant velocity is 1.91 m/s, and with an assumed right atrial pressure of 8 mmHg, the estimated right ventricular systolic pressure is 22.6 mmHg. Left Atrium: Left atrial size was not well visualized. Right Atrium: Right atrial size was not well visualized. Pericardium: There is no evidence of pericardial effusion. Mitral Valve: The mitral valve is normal in structure. Trivial mitral valve regurgitation. MV peak gradient, 4.5 mmHg. The mean mitral valve gradient is 2.0 mmHg. Tricuspid Valve: The tricuspid valve is normal in structure. Tricuspid valve regurgitation is trivial. Aortic Valve: The aortic valve is tricuspid. Aortic valve regurgitation is not visualized. Aortic valve mean gradient measures 3.0 mmHg. Aortic valve peak gradient measures 7.8 mmHg. Aortic valve area, by VTI measures 3.28 cm. Pulmonic Valve: The pulmonic valve was not well visualized. Pulmonic valve regurgitation is not visualized. Aorta: The aortic root is normal in size and structure. Venous: The inferior vena cava was not well visualized. IAS/Shunts: The interatrial septum was not well visualized.  LEFT VENTRICLE PLAX 2D LVIDd:         5.90 cm LVIDs:         4.30 cm LV PW:         1.70 cm LV IVS:        1.50 cm LVOT diam:     2.10 cm LV SV:         72 LV SV Index:   24 LVOT Area:     3.46 cm  LEFT ATRIUM         Index LA diam:    4.90 cm 1.60 cm/m  AORTIC VALVE                    PULMONIC VALVE AV Area (Vmax):    2.57 cm     PV Vmax:       1.30 m/s AV Area  (Vmean):   3.05 cm     PV Peak grad:  6.8 mmHg AV Area (VTI):     3.28 cm AV Vmax:           140.00 cm/s AV Vmean:          79.900 cm/s AV VTI:            0.221 m AV Peak Grad:  7.8 mmHg AV Mean Grad:      3.0 mmHg LVOT Vmax:         104.00 cm/s LVOT Vmean:        70.300 cm/s LVOT VTI:          0.209 m LVOT/AV VTI ratio: 0.95  AORTA Ao Root diam: 3.70 cm MITRAL VALVE               TRICUSPID VALVE MV Area (PHT): 4.10 cm    TR Peak grad:   14.6 mmHg MV Area VTI:   3.16 cm    TR Vmax:        191.00 cm/s MV Peak grad:  4.5 mmHg MV Mean grad:  2.0 mmHg    SHUNTS MV Vmax:       1.06 m/s    Systemic VTI:  0.21 m MV Vmean:      57.9 cm/s   Systemic Diam: 2.10 cm MV Decel Time: 185 msec MV E velocity: 87.00 cm/s Windell Norfolk Electronically signed by Windell Norfolk Signature Date/Time: 02/15/2023/5:23:45 PM    Final     Scheduled Meds:  aspirin EC  81 mg Oral Daily   atorvastatin  40 mg Oral Daily   carvedilol  25 mg Oral BID WC   Chlorhexidine Gluconate Cloth  6 each Topical Daily   clopidogrel  75 mg Oral Daily   Fe Fum-Vit C-Vit B12-FA  1 capsule Oral BID   heparin injection (subcutaneous)  5,000 Units Subcutaneous Q8H   insulin aspart  0-20 Units Subcutaneous TID WC   insulin aspart  0-5 Units Subcutaneous QHS   isosorbide mononitrate  60 mg Oral QHS   Continuous Infusions:   LOS: 3 days   Time spent: 40 minutes  Carollee Herter, DO  [facility]  02/17/2023, 1:48 PM

## 2023-02-17 NOTE — Care Management Important Message (Signed)
Important Message  Patient Details  Name: Raymond [name] MRN: [id number] Date of Birth: 1965/02/27   Important Message Given:  Yes - Medicare IM     Cristela Blue, CMA 02/17/2023, 11:58 AM

## 2023-02-17 NOTE — TOC Progression Note (Signed)
Transition of Care [facility]) - Progression Note    Patient Details  Name: Raymond [name] MRN: [id number] Date of Birth: May 25, 1965  Transition of [facility]) CM/SW Contact  Garret Reddish, RN Phone Number: 02/17/2023, 6:46 AM  Clinical Narrative:    Chart reviewed. No beds at this time. I have expanded search to entire Epic Hub.  TOC will continue to follow for discharge planning.   Expected Discharge Plan: Skilled Nursing Facility Barriers to Discharge: Continued Medical Work up  Expected Discharge Plan and Services     Post Acute Care Choice: Skilled Nursing Facility Living arrangements for the past 2 months: Apartment                                       Social Determinants of Health (SDOH) Interventions SDOH Screenings   Food Insecurity: No Food Insecurity (02/15/2023)  Housing: Unknown (02/15/2023)  Transportation Needs: No Transportation Needs (02/15/2023)  Utilities: Not At Risk (02/15/2023)  Tobacco Use: Low Risk  (02/14/2023)    Readmission Risk Interventions     No data to display

## 2023-02-17 NOTE — Plan of Care (Signed)
  Problem: Education: Goal: Ability to describe self-care measures that may prevent or decrease complications (Diabetes Survival Skills Education) will improve Outcome: Progressing   Problem: Coping: Goal: Ability to adjust to condition or change in health will improve 02/17/2023 0342 by Rise Patience, RN Outcome: Progressing 02/17/2023 0020 by Rise Patience, RN Outcome: Progressing   Problem: Metabolic: Goal: Ability to maintain appropriate glucose levels will improve Outcome: Progressing   Problem: Nutritional: Goal: Maintenance of adequate nutrition will improve Outcome: Progressing   Problem: Skin Integrity: Goal: Risk for impaired skin integrity will decrease 02/17/2023 0342 by Rise Patience, RN Outcome: Progressing 02/17/2023 0020 by Rise Patience, RN Outcome: Progressing   Problem: Tissue Perfusion: Goal: Adequacy of tissue perfusion will improve 02/17/2023 0342 by Rise Patience, RN Outcome: Progressing 02/17/2023 0020 by Rise Patience, RN Outcome: Progressing   Problem: Coping: Goal: Will verbalize positive feelings about self 02/17/2023 0342 by Rise Patience, RN Outcome: Progressing 02/17/2023 0020 by Rise Patience, RN Outcome: Progressing   Problem: Self-Care: Goal: Ability to participate in self-care as condition permits will improve 02/17/2023 0342 by Rise Patience, RN Outcome: Progressing 02/17/2023 0020 by Rise Patience, RN Outcome: Progressing   Problem: Nutrition: Goal: Risk of aspiration will decrease 02/17/2023 0342 by Rise Patience, RN Outcome: Progressing 02/17/2023 0020 by Rise Patience, RN Outcome: Progressing   Problem: Activity: Goal: Risk for activity intolerance will decrease 02/17/2023 0342 by Rise Patience, RN Outcome: Progressing 02/17/2023 0020 by Rise Patience, RN Outcome: Progressing   Problem: Nutrition: Goal: Adequate nutrition will be maintained Outcome: Progressing   Problem: Coping: Goal: Level of anxiety will  decrease Outcome: Progressing   Problem: Pain Management: Goal: General experience of comfort will improve Outcome: Progressing   Problem: Safety: Goal: Ability to remain free from injury will improve 02/17/2023 0342 by Rise Patience, RN Outcome: Progressing 02/17/2023 0020 by Rise Patience, RN Outcome: Progressing   Problem: Skin Integrity: Goal: Risk for impaired skin integrity will decrease 02/17/2023 0342 by Rise Patience, RN Outcome: Progressing 02/17/2023 0020 by Rise Patience, RN Outcome: Progressing

## 2023-02-18 DIAGNOSIS — Z6841 Body Mass Index (BMI) 40.0 and over, adult: Secondary | ICD-10-CM

## 2023-02-18 DIAGNOSIS — D508 Other iron deficiency anemias: Secondary | ICD-10-CM

## 2023-02-18 DIAGNOSIS — R1313 Dysphagia, pharyngeal phase: Secondary | ICD-10-CM | POA: Diagnosis not present

## 2023-02-18 DIAGNOSIS — I639 Cerebral infarction, unspecified: Secondary | ICD-10-CM | POA: Diagnosis not present

## 2023-02-18 DIAGNOSIS — J9601 Acute respiratory failure with hypoxia: Secondary | ICD-10-CM

## 2023-02-18 DIAGNOSIS — N179 Acute kidney failure, unspecified: Secondary | ICD-10-CM | POA: Diagnosis not present

## 2023-02-18 DIAGNOSIS — G4733 Obstructive sleep apnea (adult) (pediatric): Secondary | ICD-10-CM

## 2023-02-18 DIAGNOSIS — D509 Iron deficiency anemia, unspecified: Secondary | ICD-10-CM | POA: Insufficient documentation

## 2023-02-18 DIAGNOSIS — G8102 Flaccid hemiplegia affecting left dominant side: Secondary | ICD-10-CM | POA: Diagnosis not present

## 2023-02-18 DIAGNOSIS — L8990 Pressure ulcer of unspecified site, unspecified stage: Secondary | ICD-10-CM | POA: Insufficient documentation

## 2023-02-18 LAB — GLUCOSE, CAPILLARY
Glucose-Capillary: 181 mg/dL — ABNORMAL HIGH (ref 70–99)
Glucose-Capillary: 200 mg/dL — ABNORMAL HIGH (ref 70–99)
Glucose-Capillary: 259 mg/dL — ABNORMAL HIGH (ref 70–99)
Glucose-Capillary: 233 mg/dL — ABNORMAL HIGH (ref 70–99)

## 2023-02-18 LAB — COMPREHENSIVE METABOLIC PANEL
ALT: 14 U/L (ref 0–44)
AST: 12 U/L — ABNORMAL LOW (ref 15–41)
Albumin: 3.2 g/dL — ABNORMAL LOW (ref 3.5–5.0)
Alkaline Phosphatase: 67 U/L (ref 38–126)
Anion gap: 13 (ref 5–15)
BUN: 27 mg/dL — ABNORMAL HIGH (ref 6–20)
CO2: 30 mmol/L (ref 22–32)
Calcium: 8.7 mg/dL — ABNORMAL LOW (ref 8.9–10.3)
Chloride: 102 mmol/L (ref 98–111)
Creatinine, Ser: 1.24 mg/dL (ref 0.61–1.24)
GFR, Estimated: >60 mL/min (ref >60)
Glucose, Bld: 199 mg/dL — ABNORMAL HIGH (ref 70–99)
Potassium: 4.5 mmol/L (ref 3.5–5.1)
Sodium: 145 mmol/L (ref 135–145)
Total Bilirubin: 1.4 mg/dL — ABNORMAL HIGH (ref 0.0–1.2)
Total Protein: 6.6 g/dL (ref 6.5–8.1)

## 2023-02-18 LAB — CBC WITH DIFFERENTIAL/PLATELET
Abs Immature Granulocytes: 0.07 10*3/uL (ref 0.00–0.07)
Basophils Absolute: 0.1 10*3/uL (ref 0.0–0.1)
Basophils Relative: 0 %
Eosinophils Absolute: 0.3 10*3/uL (ref 0.0–0.5)
Eosinophils Relative: 2 %
HCT: 34.6 % — ABNORMAL LOW (ref 39.0–52.0)
Hemoglobin: 9.9 g/dL — ABNORMAL LOW (ref 13.0–17.0)
Immature Granulocytes: 1 %
Lymphocytes Relative: 16 %
Lymphs Abs: 2 10*3/uL (ref 0.7–4.0)
MCH: 21.6 pg — ABNORMAL LOW (ref 26.0–34.0)
MCHC: 28.6 g/dL — ABNORMAL LOW (ref 30.0–36.0)
MCV: 75.4 fL — ABNORMAL LOW (ref 80.0–100.0)
Monocytes Absolute: 1.1 10*3/uL — ABNORMAL HIGH (ref 0.1–1.0)
Monocytes Relative: 9 %
Neutro Abs: 8.7 10*3/uL — ABNORMAL HIGH (ref 1.7–7.7)
Neutrophils Relative %: 72 %
Platelets: 268 10*3/uL (ref 150–400)
RBC: 4.59 MIL/uL (ref 4.22–5.81)
RDW: 18 % — ABNORMAL HIGH (ref 11.5–15.5)
WBC: 12.3 10*3/uL — ABNORMAL HIGH (ref 4.0–10.5)
nRBC: 0 % (ref 0.0–0.2)

## 2023-02-18 LAB — MAGNESIUM: Magnesium: 2.4 mg/dL (ref 1.7–2.4)

## 2023-02-18 MED ORDER — TAMSULOSIN HCL 0.4 MG PO CAPS
0.4000 mg | ORAL_CAPSULE | Freq: Every day | ORAL | Status: DC
  Administered 2023-02-18 – 2023-02-25 (×8): 0.4 mg via ORAL
  Filled 2023-02-18 (×9): qty 1

## 2023-02-18 NOTE — Progress Notes (Signed)
Progress Note   Patient: Raymond [name] ZOX:[id number] DOB: 1965-10-15 DOA: 02/14/2023     4 DOS: the patient was seen and examined on 02/18/2023   Brief hospital course: HPI: Gilead Oneal Burlison is a 58 y.o. male with medical history significant of morbid obesity, type 2 diabetes, chronic HFpEF,  hypertension and OSA on CPAP came to ED with complaint of left-sided weakness and dysarthria.   Patient went to bed around 10 PM yesterday and was at baseline, this morning when he woke up he was having left-sided weakness and difficulty speaking so EMS was called.   Patient was also having some shortness of breath and confusion.  He was hypoxic at 75% with EMS.  Brought in as code stroke.  Teleneurology was consulted.  He was out of window for thrombolytics.   At baseline patient morbidly obese and does not move around a lot.  Per family he was using CPAP at night.   Patient denies any recent illnesses, chest pain.  He does have some orthopnea and exertional dyspnea.  Some worsening lower extremity edema.  No recent change in appetite or bowel habits.  No urinary symptoms.  Significant Events: Admitted 02/14/2023 for acute CVA MRI showing acute cortical and subcortical infarction of the right posterior frontal lobe petechial blood products present in the region without focal hematoma.  Old right cerebral infarction, old left parietal cortical and subcortical infarction. Started on BiPAP with elevated pCO2  TOC expanding search for rehab beds.  Assessment and Plan: * Acute ischemic stroke (HCC) On admission, Presented with left-sided weakness and some dysarthria when he woke up this morning, LKW around 10 PM last night, out of window for thrombolytic.  Initial CT head was negative for any acute abnormality, CTA negative for LVO.  Aspirin and Plavix as DAPT, Continue home Lipitor 02-15-2023 MRI brain confirms Acute cortical and subcortical infarction in the right posterior  frontal lobe. Petechial blood products present in the region without focal hematoma. Pt started on DAPT.  Aspirin for 21 days then Plavix monotherapy after that. Physical therapy recommending rehab  Pharyngeal dysphagia Advance to solid food on 1/18  Dysarthria due to acute cerebrovascular accident (CVA) (HCC) Advance to solid food on 1/18.  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  Acute on chronic heart failure with preserved ejection fraction (HFpEF) (HCC) EF of 55%, patient on Coreg 25 mg twice a day, Imdur 60 mg daily patient not on any water pill at this point.   AKI (acute kidney injury) (HCC) Creatinine 2.01 on presentation.  Last creatinine 1.24.  Essential hypertension Continue Coreg and Imdur  Acute on chronic respiratory failure with hypoxia and hypercapnia (HCC) - suppose to have home Trilogy machine since March 2015 On admission, Found to have hypoxia and hypercarbia.  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  Patient was using CPAP at home, not sure about the compliance.  He was placed on BiPAP in ED. chest x-ray with concern of pulmonary vascular congestion. uses 2 L of oxygen at baseline -Use BiPAP at night and while taking a nap. -Continue with supplemental oxygen-keep the saturation above 90% -Incentive spirometry 02-17-2023 continue with home trilogy vent. Now that his family has brought it to hospital.  Type 2 diabetes mellitus (HCC) Hemoglobin A1c 6.7.  Patient on sliding scale insulin here.  Can likely place on Glucophage as outpatient.  Iron deficiency anemia On multivitamin with iron  Morbid obesity with BMI of 50.0-59.9, adult [facility]) Having nursing staff clarify the weight today.  The weight today is 275 pounds less than it was yesterday.  Previous BMI was 58.17.  OSA treated with Trilogy machine Continue trilogy machine        Subjective: Patient  feels okay.  Offers no complaints.  Speech pathology upgraded diet today.  Admitted with stroke.  Physical Exam: Vitals:   02/18/23 0343 02/18/23 0500 02/18/23 0954 02/18/23 1222  BP: (!) 123/56  (!) 142/56 (!) 134/55  Pulse: 71  70 65  Resp: 20  16 16   Temp: 99 F (37.2 C)  98.7 F (37.1 C) 98.3 F (36.8 C)  TempSrc: Oral     SpO2: 91%  100% 97%  Weight:  126.2 kg    Height:       Physical Exam HENT:     Head: Normocephalic.     Mouth/Throat:     Pharynx: No oropharyngeal exudate.  Eyes:     General: Lids are normal.     Conjunctiva/sclera: Conjunctivae normal.  Cardiovascular:     Rate and Rhythm: Normal rate and regular rhythm.     Heart sounds: Normal heart sounds, S1 normal and S2 normal.  Pulmonary:     Breath sounds: Examination of the right-lower field reveals decreased breath sounds. Examination of the left-lower field reveals decreased breath sounds. Decreased breath sounds present. No wheezing, rhonchi or rales.  Abdominal:     Palpations: Abdomen is soft.     Tenderness: There is no abdominal tenderness.  Musculoskeletal:     Right lower leg: Swelling present.     Left lower leg: Swelling present.  Skin:    General: Skin is warm.     Comments: Dry skin on feet.  Neurological:     Mental Status: He is alert and oriented to person, place, and time.     Comments: Able to straight leg raise bilaterally.  Power 4 out of 5 bilateral upper and lower extremities.     Data Reviewed: MRI reviewed Creatinine 1.24, total bilirubin 1.4, glucose 199, LDL 12, hemoglobin 9.9, white blood cell count 12.3, ferritin 16  Family Communication: Refused  Disposition: Status is: Inpatient Remains inpatient appropriate because: TOC looking into rehab beds needing to expand discharge.  Planned Discharge Destination: Rehab    Time spent: 28 minutes  Author: Alford Highland, MD 02/18/2023 3:12 PM  For on call review [email or web address].

## 2023-02-18 NOTE — Progress Notes (Signed)
Mobility Specialist - Progress Note  Post-mobility: HR 75, SPO2 94%   02/18/23 1511  Mobility  Activity Dangled on edge of bed;Stood at bedside  Level of Assistance Moderate assist, patient does 50-74%  Assistive Device Front wheel walker  Activity Response Tolerated well  Mobility visit 1 Mobility  Mobility Specialist Start Time (ACUTE ONLY) 1421  Mobility Specialist Stop Time (ACUTE ONLY) 1439  Mobility Specialist Time Calculation (min) (ACUTE ONLY) 18 min   Pt supine upon entry, utilizing 4L [place]. Pt agreeable and willing to participate in activity this date. Pt completed bed mob MinA only requiring HHA to bring trunk from sup to sit-- cuing to utilize the railing of the bed with RUE to assist with transfer EOB. Pt dangled EOB for ~2 mins before STS to RW ModA +2. Pt completed standing marches, clearing BLE ~1 inch off the ground. Upon return to bed Pt took one lateral step towards the [facility] and returned supine. Pt left in fowler position with needs within reach. RN notified.   Zetta Bills Mobility Specialist 02/18/23 3:22 PM

## 2023-02-18 NOTE — Progress Notes (Signed)
Speech Language Pathology Treatment: Dysphagia (dysarthria)  Patient Details Name: Raymond [name] MRN: [id number] DOB: [birth year] Today's Date: 02/18/2023 Time: 6433-2951 SLP Time Calculation (min) (ACUTE ONLY): 30 min  Assessment / Plan / Recommendation Clinical Impression  Pt seen for follow up intervention targeting dysphagia and dysarthria. Regarding dysphagia, pt/family endorsing pt not satisfied with current diet options. Therefore, trials completed with regular solids. Pt demonstrating increased oral manipulation (lingual laterization and propulsion) for clearance of regular solids with aid of liquid wash and extended time for completion. Thin liquid trials also completed with single cough noted after sequential sips. Education provided for aspiration precautions (specifically slow rate, single sips) and selection of soft/easy to chew solids from regular menu items. Pt/family reported understanding. Recommend trial diet advancement to regular solids and thin liquids with continued aspiration precautions (slow rate, small bites, elevated HOB, and alert for PO intake). MD and RN aware of recommendations.   Mild dysarthria persists, though respiratory/breath support with notable improvement compared to last session. Articulation precision aided by intermittent repetition in the setting of communication breakdown, with pt participating in conversational discourse with family/therapist with grossly 80% intelligibility. Family demonstrating benefit of context and familiarity for intermittent mis communications. Sustained/selective attention intact for conversation completed with distractions. Expressive/receptive language intact for conversation. Question functional memory deficit, with patient reporting potential limited recall for diagnosis and frustration with team for not disclosing diagnosis initially (though pt might not recall information shared with patient).   SLP to continue to  follow for acute intervention, with recommendation for further speech intervention at next level of care.    HPI HPI: Pt is a 58 y.o. male with medical history significant of morbid Obesity(BMI 57.44, 202.9kg), more Sedentary at home per report, type 2 diabetes, chronic HFpEF,  hypertension and OSA on CPAP came to ED with complaint of left-sided weakness and dysarthria.     Patient went to bed around 10 PM yesterday and was at baseline, this morning when he woke up he was having left-sided weakness and difficulty speaking so EMS was called.     Patient was also having some shortness of breath and confusion.  He was hypoxic at 75% with EMS.    MRI: Acute cortical and subcortical infarction in the right posterior  frontal lobe. Petechial blood products present in the region without  focal hematoma.  2. 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 Old left parietal cortical and  subcortical infarction. Few scattered old small vessel infarctions  in both hemispheres affecting the white matter.     CXR: Low lung volumes. Cardiomegaly with findings suggestive of pulmonary  vascular congestion.      SLP Plan  Continue with current plan of care      Recommendations for follow up therapy are one component of a multi-disciplinary discharge planning process, led by the attending physician.  Recommendations may be updated based on patient status, additional functional criteria and insurance authorization.    Recommendations  Diet recommendations: Regular (chopped meats) Liquids provided via: Straw;Cup Medication Administration: Whole meds with puree (vs liquid as able) Supervision: Intermittent supervision to cue for compensatory strategies Compensations: Minimize environmental distractions;Slow rate;Small sips/bites Postural Changes and/or Swallow Maneuvers: Seated upright 90 degrees;Upright 30-60 min after meal                  Oral care BID   Intermittent Supervision/Assistance Dysphagia, oropharyngeal  phase (R13.12);Dysarthria and anarthria (R47.1);Cognitive communication deficit (R41.841)     Continue with current plan of care    Raymond  Aida Raider  MS [facility] SLP   Raymond J Clapp  02/18/2023, 12:23 PM

## 2023-02-18 NOTE — Assessment & Plan Note (Signed)
Having nursing staff clarify the weight today.  The weight today is 275 pounds less than it was yesterday.  Previous BMI was 58.17.

## 2023-02-18 NOTE — Assessment & Plan Note (Signed)
On multivitamin with iron.  Will give IV iron today since on aspirin and Plavix and hemoglobin drifted down to 9.2.

## 2023-02-19 DIAGNOSIS — I639 Cerebral infarction, unspecified: Secondary | ICD-10-CM | POA: Diagnosis not present

## 2023-02-19 DIAGNOSIS — R1313 Dysphagia, pharyngeal phase: Secondary | ICD-10-CM | POA: Diagnosis not present

## 2023-02-19 DIAGNOSIS — J9601 Acute respiratory failure with hypoxia: Secondary | ICD-10-CM | POA: Diagnosis not present

## 2023-02-19 DIAGNOSIS — G8102 Flaccid hemiplegia affecting left dominant side: Secondary | ICD-10-CM | POA: Diagnosis not present

## 2023-02-19 LAB — GLUCOSE, CAPILLARY
Glucose-Capillary: 248 mg/dL — ABNORMAL HIGH (ref 70–99)
Glucose-Capillary: 303 mg/dL — ABNORMAL HIGH (ref 70–99)
Glucose-Capillary: 280 mg/dL — ABNORMAL HIGH (ref 70–99)
Glucose-Capillary: 268 mg/dL — ABNORMAL HIGH (ref 70–99)

## 2023-02-19 MED ORDER — AMMONIUM LACTATE 12 % EX LOTN
TOPICAL_LOTION | Freq: Two times a day (BID) | CUTANEOUS | Status: DC
Start: 2023-02-19 — End: 2023-02-27
  Administered 2023-02-20 – 2023-02-24 (×3): 1 via TOPICAL
  Filled 2023-02-19: qty 400

## 2023-02-19 NOTE — Progress Notes (Signed)
Progress Note   Patient: Raymond [name] WUJ:[id number] DOB: 03/09/1965 DOA: 02/14/2023     5 DOS: the patient was seen and examined on 02/19/2023   Brief hospital course: HPI: 58 y.o. male with medical history significant of morbid obesity, type 2 diabetes, chronic HFpEF,  hypertension and OSA on CPAP came to ED with complaint of left-sided weakness and dysarthria.   Patient went to bed around 10 PM and was at baseline, this morning when he woke up he was having left-sided weakness and difficulty speaking so EMS was called.   Patient was also having some shortness of breath and confusion.  He was hypoxic at 75% with EMS.  Brought in as code stroke.  Teleneurology was consulted.  He was out of window for thrombolytics.   At baseline patient morbidly obese and does not move around a lot.  Per family he was using CPAP at night.   Patient denies any recent illnesses, chest pain.  He does have some orthopnea and exertional dyspnea.  Some worsening lower extremity edema.  No recent change in appetite or bowel habits.  No urinary symptoms.  Significant Events: Admitted 02/14/2023 for acute CVA MRI showing acute cortical and subcortical infarction of the right posterior frontal lobe petechial blood products present in the region without focal hematoma.  Old right cerebral infarction, old left parietal cortical and subcortical infarction. On trilogy with elevated pCO2  TOC expanding search for rehab beds.  Assessment and Plan: * Acute ischemic stroke (HCC) On admission, Presented with left-sided weakness and some dysarthria when he woke up this morning, LKW around 10 PM last night, out of window for thrombolytic.  Initial CT head was negative for any acute abnormality, CTA negative for LVO.  Aspirin and Plavix as DAPT, Continue home Lipitor 02-15-2023 MRI brain confirms Acute cortical and subcortical infarction in the right posterior frontal lobe. Petechial blood products present in the  region without focal hematoma. Pt started on DAPT.  Aspirin for 21 days total with Plavix then Plavix monotherapy after that. Physical therapy recommending rehab  Pharyngeal dysphagia Advance to solid food on 1/18  Dysarthria due to acute cerebrovascular accident (CVA) (HCC) Advance to solid food on 1/18.  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  Acute on chronic respiratory failure with hypoxia and hypercapnia (HCC) - suppose to have home Trilogy machine since March 2015 On admission, Found to have hypoxia and hypercarbia.  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  Patient was using CPAP at home, not sure about the compliance.  He was placed on BiPAP in ED. chest x-ray with concern of pulmonary vascular congestion. uses 2 L of oxygen at baseline -Use BiPAP at night and while taking a nap. -Continue with supplemental oxygen-keep the saturation above 90% -Incentive spirometry 02-17-2023 continue with home trilogy at night. Now that his family has brought it to hospital.  AKI (acute kidney injury) (HCC) Creatinine 2.01 on presentation.  Last creatinine 1.24.  Acute on chronic heart failure with preserved ejection fraction (HFpEF) (HCC) EF of 55%, patient on Coreg 25 mg twice a day, Imdur 60 mg daily patient not on any water pill at this point.   Essential hypertension Continue Coreg and Imdur  Type 2 diabetes mellitus (HCC) Hemoglobin A1c 6.7.  Patient on sliding scale insulin here.  Can likely place on Glucophage as outpatient.  OSA treated with Trilogy machine Continue trilogy machine  Morbid  obesity with BMI of 50.0-59.9, adult (HCC) BMI is 57.66.  Iron deficiency anemia On multivitamin with iron        Subjective: Patient feels okay.  Low pulse ox this morning is when he took all of his machine and was just on room air.  Will remove Foley catheter today.  Patient states  that he does not have problems urinating.  Physical Exam: Vitals:   02/19/23 0420 02/19/23 0507 02/19/23 0754 02/19/23 0844  BP: (!) 125/56 (!) 136/56 122/62   Pulse: (!) 59 61 62 61  Resp: 16 18 17 20   Temp: 98.3 F (36.8 C) 98.1 F (36.7 C)    TempSrc:      SpO2: (!) 89% (!) 68% (!) 86% 94%  Weight:      Height:       Physical Exam HENT:     Head: Normocephalic.     Mouth/Throat:     Pharynx: No oropharyngeal exudate.  Eyes:     General: Lids are normal.     Conjunctiva/sclera: Conjunctivae normal.  Cardiovascular:     Rate and Rhythm: Normal rate and regular rhythm.     Heart sounds: Normal heart sounds, S1 normal and S2 normal.  Pulmonary:     Breath sounds: Examination of the right-lower field reveals decreased breath sounds. Examination of the left-lower field reveals decreased breath sounds. Decreased breath sounds present. No wheezing, rhonchi or rales.  Abdominal:     Palpations: Abdomen is soft.     Tenderness: There is no abdominal tenderness.  Musculoskeletal:     Right lower leg: Swelling present.     Left lower leg: Swelling present.  Skin:    General: Skin is warm.     Comments: Dry skin on feet.  Neurological:     Mental Status: He is alert and oriented to person, place, and time.     Comments: Able to straight leg raise bilaterally.  Power 4 out of 5 bilateral upper and lower extremities.     Data Reviewed: Last hemoglobin 9.9, creatinine 1.24  Family Communication: Declined  Disposition: Status is: Inpatient Remains inpatient appropriate because: TOC expanding bed search  Planned Discharge Destination: Rehab    Time spent: 28 minutes  Author: Alford Highland, MD 02/19/2023 3:29 PM  For on call review [email or web address].

## 2023-02-20 DIAGNOSIS — I639 Cerebral infarction, unspecified: Secondary | ICD-10-CM | POA: Diagnosis not present

## 2023-02-20 LAB — BASIC METABOLIC PANEL
Anion gap: 9 (ref 5–15)
BUN: 50 mg/dL — ABNORMAL HIGH (ref 6–20)
CO2: 29 mmol/L (ref 22–32)
Calcium: 8.2 mg/dL — ABNORMAL LOW (ref 8.9–10.3)
Chloride: 100 mmol/L (ref 98–111)
Creatinine, Ser: 1.67 mg/dL — ABNORMAL HIGH (ref 0.61–1.24)
GFR, Estimated: 47 mL/min — ABNORMAL LOW (ref >60)
Glucose, Bld: 296 mg/dL — ABNORMAL HIGH (ref 70–99)
Potassium: 4.5 mmol/L (ref 3.5–5.1)
Sodium: 138 mmol/L (ref 135–145)

## 2023-02-20 LAB — GLUCOSE, CAPILLARY
Glucose-Capillary: 269 mg/dL — ABNORMAL HIGH (ref 70–99)
Glucose-Capillary: 215 mg/dL — ABNORMAL HIGH (ref 70–99)
Glucose-Capillary: 328 mg/dL — ABNORMAL HIGH (ref 70–99)
Glucose-Capillary: 361 mg/dL — ABNORMAL HIGH (ref 70–99)

## 2023-02-20 LAB — CBC
HCT: 32.6 % — ABNORMAL LOW (ref 39.0–52.0)
Hemoglobin: 9.2 g/dL — ABNORMAL LOW (ref 13.0–17.0)
MCH: 21.4 pg — ABNORMAL LOW (ref 26.0–34.0)
MCHC: 28.2 g/dL — ABNORMAL LOW (ref 30.0–36.0)
MCV: 76 fL — ABNORMAL LOW (ref 80.0–100.0)
Platelets: 273 10*3/uL (ref 150–400)
RBC: 4.29 MIL/uL (ref 4.22–5.81)
RDW: 17.6 % — ABNORMAL HIGH (ref 11.5–15.5)
WBC: 9.4 10*3/uL (ref 4.0–10.5)
nRBC: 0 % (ref 0.0–0.2)

## 2023-02-20 MED ORDER — INSULIN GLARGINE-YFGN 100 UNIT/ML ~~LOC~~ SOLN
10.0000 [IU] | Freq: Every day | SUBCUTANEOUS | Status: DC
  Administered 2023-02-20: 10 [IU] via SUBCUTANEOUS
  Filled 2023-02-20: qty 0.1

## 2023-02-20 MED ORDER — IRON SUCROSE 300 MG IVPB - SIMPLE MED
300.0000 mg | Freq: Once | Status: AC
  Administered 2023-02-20: 300 mg via INTRAVENOUS
  Filled 2023-02-20: qty 300

## 2023-02-20 MED ORDER — SODIUM CHLORIDE 0.9 % IV BOLUS
500.0000 mL | Freq: Once | INTRAVENOUS | Status: AC
  Administered 2023-02-20: 500 mL via INTRAVENOUS

## 2023-02-20 NOTE — Progress Notes (Signed)
Speech Language Pathology Treatment: Dysphagia  Patient Details Name: Raymond [name] MRN: [id number] DOB: 1965-02-07 Today's Date: 02/20/2023 Time: 1210-1225 SLP Time Calculation (min) (ACUTE ONLY): 15 min  Assessment / Plan / Recommendation Clinical Impression  Pt seen for follow up dysphagia intervention. Pt positioned in chair following recent completion of session with mobility specialist who indicated concern for O2 saturations. 4L nasal canula in place, with O2 saturations maintained at 90-93 for duration of trials. Pt reported reduced shortness of breath and overt reduced WOB noted with time following completion of mobility. Pt seen with trials of regular solids and thin liquids. No overt or subtle s/sx pharyngeal dysphagia noted. No change to vocal quality across trials. Vitals stable for duration of trials. Mastication mildly prolonged secondary to dentition and weakness, though pt independent with eventual clearance of oral cavity. Pt and RN report pt takes break as needed during intake if short of breath.  Education shared with pt regarding importance of monitoring WOB during PO intake, pausing for rest/recovery, selection of softer solids, and recommendations for follow up services. Pt reported understanding. Given gross stability and compliance with aspiration precautions, recommend continuation of regular solids and thin liquids. Based on steady gains in motor speech and dysphagia, no further acute services warranted in house, recommend follow up SLP services at next level of care.    HPI HPI: Pt is a 58 y.o. male with medical history significant of morbid Obesity(BMI 57.44, 202.9kg), more Sedentary at home per report, type 2 diabetes, chronic HFpEF,  hypertension and OSA on CPAP came to ED with complaint of left-sided weakness and dysarthria.     Patient went to bed around 10 PM yesterday and was at baseline, this morning when he woke up he was having left-sided weakness and  difficulty speaking so EMS was called.     Patient was also having some shortness of breath and confusion.  He was hypoxic at 75% with EMS.    MRI: Acute cortical and subcortical infarction in the right posterior  frontal lobe. Petechial blood products present in the region without  focal hematoma.  2. 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 Old left parietal cortical and  subcortical infarction. Few scattered old small vessel infarctions  in both hemispheres affecting the white matter.     CXR: Low lung volumes. Cardiomegaly with findings suggestive of pulmonary  vascular congestion.      SLP Plan  All goals met (all follow up services to be completed at next level of care)      Recommendations for follow up therapy are one component of a multi-disciplinary discharge planning process, led by the attending physician.  Recommendations may be updated based on patient status, additional functional criteria and insurance authorization.    Recommendations  Diet recommendations: Thin liquid;Regular Liquids provided via: Straw;Cup Medication Administration: Whole meds with puree (vs liquid as able) Supervision: Intermittent supervision to cue for compensatory strategies Compensations: Minimize environmental distractions;Slow rate;Small sips/bites Postural Changes and/or Swallow Maneuvers: Seated upright 90 degrees;Upright 30-60 min after meal                  Oral care BID   Intermittent Supervision/Assistance Dysphagia, oropharyngeal phase (R13.12);Dysarthria and anarthria (R47.1);Cognitive communication deficit (R41.841)     All goals met (all follow up services to be completed at next level of care)    [place] Vonzell Lindblad Clapp  MS [facility] SLP   [place] J Clapp  02/20/2023, 1:13 PM

## 2023-02-20 NOTE — Inpatient Diabetes Management (Signed)
Inpatient Diabetes Program Recommendations  AACE/ADA: New Consensus Statement on Inpatient Glycemic Control (2025)  Target Ranges:  Prepandial:   less than 140 mg/dL      Peak postprandial:   less than 180 mg/dL (1-2 hours)      Critically ill patients:  140 - 180 mg/dL   Lab Results  Component Value Date   GLUCAP 215 (H) 02/20/2023   HGBA1C 6.7 (H) 02/14/2023    Review of Glycemic Control  Latest Reference Range & Units 02/19/23 11:44 02/19/23 16:28 02/19/23 21:09 02/20/23 08:07 02/20/23 11:29  Glucose-Capillary 70 - 99 mg/dL 782 (H) 956 (H) 213 (H) 269 (H) 215 (H)   Diabetes history: DM  Outpatient Diabetes medications:  Metformin 1000 mg bid Lantus 60 units bid Novolog tid with meals  Current orders for Inpatient glycemic control:  Novolog 0-20 units tid with meals and HS  Inpatient Diabetes Program Recommendations:    Note that patient's blood sugars low initially.  Now trending up.  Consider adding Semglee 25 units daily.   Thanks,  Lorenza Cambridge, RN, BC-ADM Inpatient Diabetes Coordinator Pager [phone]  (8a-5p)

## 2023-02-20 NOTE — Progress Notes (Signed)
Mobility Specialist - Progress Note  During mobility: HR, BP, SpO2 75% Post-mobility: HR, BP, SPO2 92%   02/20/23 1358  Mobility  Activity Transferred from bed to chair  Level of Assistance Minimal assist, patient does 75% or more  Assistive Device Front wheel walker  Distance Ambulated (ft) 4 ft  Activity Response Tolerated well  Mobility visit 1 Mobility  Mobility Specialist Start Time (ACUTE ONLY) 1156  Mobility Specialist Stop Time (ACUTE ONLY) 1211  Mobility Specialist Time Calculation (min) (ACUTE ONLY) 15 min   Pt supine upon entry, utilizing 3L. Pt motivated and agreeable to transfer to the recliner this date. Pt completed bed mob MinA to bring trunk from sup to sit. Pt dangled EOB for ~2 mins, O2 89% Pt denies SOB or dizziness. Pt STS to RW MaxA +2, transferred to the recliner MinA-- taking lateral and backwards steps, cuing to bring RW with Pt when turning and stepping backwards. Upon sitting in the recliner Pt is noticeably SOB-- O2 75%. After one min of pursed lip breathing, Pt placed on 4L with an O2 saturation of 92%, RN notified. Pt left seated with alarm set and needs within reach.   Zetta Bills Mobility Specialist 02/20/23 2:20 PM

## 2023-02-20 NOTE — Progress Notes (Addendum)
Physical Therapy Treatment Patient Details Name: Raymond [name] MRN: [id number] DOB: 04/05/1965 Today's Date: 02/20/2023   History of Present Illness Pt is a 58 year old male presented to the ED with L sided weakness and dysarthria, MRI showing  Acute cortical and subcortical infarction in the right posterior  frontal lobe. 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 Few scattered old small vessel infarctions  in both hemispheres affecting the white matter.    PMH significant for morbid obesity, type 2 diabetes, chronic HFpEF,  hypertension and OSA on CPAP    PT Comments  Pt up in chair upon arrival.  He transitioned to chair with mobility techs about 3 hours earlier.  He is able to stand from recliner with min/mod a x 2 and increased time to power up and straighten posture.  Once standing he is able to take good quality steps to bed with no imbalances but is limited by fatigue.  He needs mod a x 2 to get BLE's back on to bed.  Pt happy today and cooperative with session.  Discharge recommendations remain appropriate as he is motivated to increase functional independence and return home.   If plan is discharge home, recommend the following: Two people to help with walking and/or transfers;Two people to help with bathing/dressing/bathroom;Assistance with cooking/housework;Assist for transportation;Help with stairs or ramp for entrance   Can travel by private vehicle        Equipment Recommendations       Recommendations for Other Services       Precautions / Restrictions Precautions Precautions: Fall Restrictions Weight Bearing Restrictions Per Provider Order: No     Mobility  Bed Mobility Overal bed mobility: Needs Assistance Bed Mobility: Sit to Supine       Sit to supine: Mod assist, +2 for physical assistance   General bed mobility comments: assist to  get LE's onto bed but able to manage upper body himself Patient Response: Cooperative  Transfers Overall transfer level: Needs assistance Equipment used: Rolling walker (2 wheels) Transfers: Sit to/from Stand Sit to Stand: Min assist, Mod assist, +2 physical assistance           General transfer comment: increased time and effort from recliner but overall does well    Ambulation/Gait Ambulation/Gait assistance: Contact guard assist, +2 physical assistance Gait Distance (Feet): 4 Feet Assistive device: Rolling walker (2 wheels) Gait Pattern/deviations: Step-to pattern Gait velocity: dec     General Gait Details: able to take good quality steps from recliner to bed with heaby walker use but no buckling or unsteadiness   Stairs             Wheelchair Mobility     Tilt Bed Tilt Bed Patient Response: Cooperative  Modified Rankin (Stroke Patients Only)       Balance Overall balance assessment: Needs assistance Sitting-balance support: Bilateral upper extremity supported Sitting balance-Leahy Scale: Good Sitting balance - Comments: able tos it unsupported today on air mattress and correct positioning on his own   Standing balance support: Bilateral upper extremity supported Standing balance-Leahy Scale: Fair Standing balance comment: reliance on walker but overall does well                            Cognition Arousal: Alert Behavior During Therapy: WFL for tasks assessed/performed Overall Cognitive Status: Within Functional Limits for tasks assessed  General Comments: happy and participating today in session        Exercises      General Comments        Pertinent Vitals/Pain Pain Assessment Pain Assessment: No/denies pain    Home Living                          Prior Function            PT Goals (current goals can now be found in the care plan section) Progress towards PT  goals: Progressing toward goals    Frequency    Min 1X/week      PT Plan      Co-evaluation              AM-PAC PT "6 Clicks" Mobility   Outcome Measure  Help needed turning from your back to your side while in a flat bed without using bedrails?: A Lot Help needed moving from lying on your back to sitting on the side of a flat bed without using bedrails?: A Lot Help needed moving to and from a bed to a chair (including a wheelchair)?: A Lot Help needed standing up from a chair using your arms (e.g., wheelchair or bedside chair)?: A Lot Help needed to walk in hospital room?: A Little Help needed climbing 3-5 steps with a railing? : Total 6 Click Score: 12    End of Session Equipment Utilized During Treatment: Oxygen Activity Tolerance: Patient tolerated treatment well Patient left: with bed alarm set;with call bell/phone within reach;with nursing/sitter in room Nurse Communication: Mobility status PT Visit Diagnosis: Muscle weakness (generalized) (M62.81);Difficulty in walking, not elsewhere classified (R26.2);Unsteadiness on feet (R26.81);Hemiplegia and hemiparesis;Other symptoms and signs involving the nervous system (R29.898) Hemiplegia - Right/Left: Left Hemiplegia - dominant/non-dominant: Dominant Hemiplegia - caused by: Cerebral infarction     Time: 0218-0233 PT Time Calculation (min) (ACUTE ONLY): 15 min  Charges:    $Therapeutic Activity: 8-22 mins PT General Charges $$ ACUTE PT VISIT: 1 Visit                   Danielle Dess, PTA 02/20/23, 2:40 PM

## 2023-02-20 NOTE — Progress Notes (Signed)
Progress Note   Patient: Raymond [name] ZOX:[id number] DOB: 1965/03/04 DOA: 02/14/2023     6 DOS: the patient was seen and examined on 02/20/2023   Brief hospital course: HPI: 58 y.o. male with medical history significant of morbid obesity, type 2 diabetes, chronic HFpEF,  hypertension and OSA on CPAP came to ED with complaint of left-sided weakness and dysarthria.   Patient went to bed around 10 PM and was at baseline, this morning when he woke up he was having left-sided weakness and difficulty speaking so EMS was called.   Patient was also having some shortness of breath and confusion.  He was hypoxic at 75% with EMS.  Brought in as code stroke.  Teleneurology was consulted.  He was out of window for thrombolytics.   At baseline patient morbidly obese and does not move around a lot.  Per family he was using CPAP at night.   Patient denies any recent illnesses, chest pain.  He does have some orthopnea and exertional dyspnea.  Some worsening lower extremity edema.  No recent change in appetite or bowel habits.  No urinary symptoms.  Significant Events: Admitted 02/14/2023 for acute CVA MRI showing acute cortical and subcortical infarction of the right posterior frontal lobe petechial blood products present in the region without focal hematoma.  Old right cerebral infarction, old left parietal cortical and subcortical infarction. On trilogy with elevated pCO2  TOC expanding search for rehab beds.  1/19.  Foley catheter removed 1/20.  Creatinine up at 1.67.  Will give fluid bolus.  Assessment and Plan: * Acute ischemic stroke (HCC) On admission, Presented with left-sided weakness and some dysarthria when he woke up this morning, LKW around 10 PM last night, out of window for thrombolytic.  Initial CT head was negative for any acute abnormality, CTA negative for LVO.  Aspirin and Plavix as DAPT, Continue home Lipitor 02-15-2023 MRI brain confirms Acute cortical and subcortical  infarction in the right posterior frontal lobe. Petechial blood products present in the region without focal hematoma. Pt started on DAPT.  Aspirin for 21 days total with Plavix then Plavix monotherapy after that. Physical therapy recommending rehab Patient able to move his left arm and leg.  Pharyngeal dysphagia Advance to solid food on 1/18  Dysarthria due to acute cerebrovascular accident (CVA) (HCC) Advance to solid food on 1/18.  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  Acute on chronic respiratory failure with hypoxia and hypercapnia (HCC) - suppose to have home Trilogy machine since March 2015 On admission, Found to have hypoxia and hypercarbia.  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  Patient was using CPAP at home, not sure about the compliance.  He was placed on BiPAP in ED. chest x-ray with concern of pulmonary vascular congestion. uses 2 L of oxygen at baseline -Use BiPAP at night and while taking a nap. -Continue with supplemental oxygen-keep the saturation above 90% -Incentive spirometry 02-17-2023 continue with home trilogy at night. Now that his family has brought it to hospital.  AKI (acute kidney injury) (HCC) Creatinine 2.01 on presentation.  Creatinine 1.24 on 1/18.  Creatinine on 1/20 is 1.67.  Will give a fluid bolus.  Foley catheter removed yesterday and patient states he is urinating.  Will continue to monitor.  Acute on chronic heart failure with preserved ejection fraction (HFpEF) (HCC) EF of 55%, patient on Coreg 25 mg twice a day, Imdur 60 mg daily  patient not on any water pill at this point.   Essential hypertension Continue Coreg and Imdur  Type 2 diabetes mellitus (HCC) Hemoglobin A1c 6.7.  Patient on sliding scale insulin here.  Will start Semglee insulin 10 units at night since the patient's sugars are up in the 300s currently.  Likely can go on Glucophage as  outpatient.  OSA treated with Trilogy machine Continue trilogy machine  Morbid obesity with BMI of 50.0-59.9, adult (HCC) BMI is 57.66.  Iron deficiency anemia On multivitamin with iron.  Will give IV iron today since on aspirin and Plavix and hemoglobin drifted down to 9.2.        Subjective: Patient feels okay.  Able to move his left side.  Admitted with stroke.  We do not have a rehab bed yet.  Patient states he has urinated after Foley removal.  Physical Exam: Vitals:   02/20/23 0521 02/20/23 0737 02/20/23 1131 02/20/23 1632  BP: 131/70 (!) 150/60 (!) 129/57 134/67  Pulse: (!) 59 (!) 53 (!) 55 66  Resp: 20 20 16 18   Temp: 98.3 F (36.8 C) (!) 97.5 F (36.4 C) 98.1 F (36.7 C) 98.3 F (36.8 C)  TempSrc:  Oral Oral Oral  SpO2: 100% 96% 91% 93%  Weight:      Height:       Physical Exam HENT:     Head: Normocephalic.     Mouth/Throat:     Pharynx: No oropharyngeal exudate.  Eyes:     General: Lids are normal.     Conjunctiva/sclera: Conjunctivae normal.  Cardiovascular:     Rate and Rhythm: Normal rate and regular rhythm.     Heart sounds: Normal heart sounds, S1 normal and S2 normal.  Pulmonary:     Breath sounds: Examination of the right-lower field reveals decreased breath sounds. Examination of the left-lower field reveals decreased breath sounds. Decreased breath sounds present. No wheezing, rhonchi or rales.  Abdominal:     Palpations: Abdomen is soft.     Tenderness: There is no abdominal tenderness.  Musculoskeletal:     Right lower leg: Swelling present.     Left lower leg: Swelling present.  Skin:    General: Skin is warm.     Comments: Dry skin on feet.  Neurological:     Mental Status: He is alert and oriented to person, place, and time.     Comments: Able to straight leg raise bilaterally.  Power 4 out of 5 bilateral upper and lower extremities.     Data Reviewed: Hemoglobin A1c 6.7, last glucose 328, creatinine 1.67, last hemoglobin  9.2  Family Communication: Refused  Disposition: Status is: Inpatient Remains inpatient appropriate because: We do not have a rehab bed.  Will give IV iron today.  Planned Discharge Destination: Rehab    Time spent: 27 minutes  Author: Alford Highland, MD 02/20/2023 5:09 PM  For on call review [email or web address].

## 2023-02-20 NOTE — TOC Progression Note (Signed)
Transition of Care [facility]) - Progression Note    Patient Details  Name: Raymond [name] MRN: [id number] Date of Birth: [birth year]  Transition of Care [facility]) CM/SW Contact  Allena Katz, LCSW Phone Number: 02/20/2023, 10:15 AM  Clinical Narrative:   CSW spoke with tammy at [facility] who states that pt needs to be participating with therapy. She says if pt is on triology she can't take him for her local buildings but states [facility] in [place] can take pt on a triology but he needs to be participating more.     Expected Discharge Plan: Skilled Nursing Facility Barriers to Discharge: Continued Medical Work up  Expected Discharge Plan and Services     Post Acute Care Choice: Skilled Nursing Facility Living arrangements for the past 2 months: Apartment                                       Social Determinants of Health (SDOH) Interventions SDOH Screenings   Food Insecurity: No Food Insecurity (02/15/2023)  Housing: Low Risk  (02/17/2023)  Transportation Needs: No Transportation Needs (02/15/2023)  Utilities: Not At Risk (02/15/2023)  Tobacco Use: Low Risk  (02/14/2023)    Readmission Risk Interventions     No data to display

## 2023-02-21 DIAGNOSIS — R1313 Dysphagia, pharyngeal phase: Secondary | ICD-10-CM | POA: Diagnosis not present

## 2023-02-21 DIAGNOSIS — I639 Cerebral infarction, unspecified: Secondary | ICD-10-CM | POA: Diagnosis not present

## 2023-02-21 DIAGNOSIS — G8102 Flaccid hemiplegia affecting left dominant side: Secondary | ICD-10-CM | POA: Diagnosis not present

## 2023-02-21 DIAGNOSIS — J9601 Acute respiratory failure with hypoxia: Secondary | ICD-10-CM | POA: Diagnosis not present

## 2023-02-21 LAB — BASIC METABOLIC PANEL
Anion gap: 8 (ref 5–15)
BUN: 48 mg/dL — ABNORMAL HIGH (ref 6–20)
CO2: 29 mmol/L (ref 22–32)
Calcium: 8 mg/dL — ABNORMAL LOW (ref 8.9–10.3)
Chloride: 98 mmol/L (ref 98–111)
Creatinine, Ser: 1.45 mg/dL — ABNORMAL HIGH (ref 0.61–1.24)
GFR, Estimated: 56 mL/min — ABNORMAL LOW (ref >60)
Glucose, Bld: 343 mg/dL — ABNORMAL HIGH (ref 70–99)
Potassium: 4.5 mmol/L (ref 3.5–5.1)
Sodium: 135 mmol/L (ref 135–145)

## 2023-02-21 LAB — HEMOGLOBIN: Hemoglobin: 9.3 g/dL — ABNORMAL LOW (ref 13.0–17.0)

## 2023-02-21 LAB — GLUCOSE, CAPILLARY
Glucose-Capillary: 313 mg/dL — ABNORMAL HIGH (ref 70–99)
Glucose-Capillary: 348 mg/dL — ABNORMAL HIGH (ref 70–99)
Glucose-Capillary: 341 mg/dL — ABNORMAL HIGH (ref 70–99)
Glucose-Capillary: 350 mg/dL — ABNORMAL HIGH (ref 70–99)

## 2023-02-21 MED ORDER — INSULIN GLARGINE-YFGN 100 UNIT/ML ~~LOC~~ SOLN
25.0000 [IU] | Freq: Every day | SUBCUTANEOUS | Status: DC
  Administered 2023-02-21: 25 [IU] via SUBCUTANEOUS
  Filled 2023-02-21: qty 0.25

## 2023-02-21 NOTE — Plan of Care (Signed)

## 2023-02-21 NOTE — Inpatient Diabetes Management (Signed)
Inpatient Diabetes Program Recommendations  AACE/ADA: New Consensus Statement on Inpatient Glycemic Control (2015)  Target Ranges:  Prepandial:   less than 140 mg/dL      Peak postprandial:   less than 180 mg/dL (1-2 hours)      Critically ill patients:  140 - 180 mg/dL   Lab Results  Component Value Date   GLUCAP 361 (H) 02/20/2023   HGBA1C 6.7 (H) 02/14/2023    Review of Glycemic Control  Latest Reference Range & Units 02/20/23 08:07 02/20/23 11:29 02/20/23 16:34 02/20/23 20:37  Glucose-Capillary 70 - 99 mg/dL 161 (H) 096 (H) 045 (H) 361 (H)   Diabetes history: DM  Outpatient Diabetes medications:  Metformin 1000 mg bid Lantus 60 units bid Novolog tid with meals  Current orders for Inpatient glycemic control:  Novolog 0-20 units tid with meals and HS   Inpatient Diabetes Program Recommendations:  Consider increasing Semglee to 25 units daily.   Thanks,  Lorenza Cambridge, RN, BC-ADM Inpatient Diabetes Coordinator Pager [phone]  (8a-5p)

## 2023-02-21 NOTE — Progress Notes (Signed)
Progress Note   Patient: Raymond [name] WJX:[id number] DOB: 1965-07-06 DOA: 02/14/2023     7 DOS: the patient was seen and examined on 02/21/2023   Brief hospital course: HPI: 58 y.o. male with medical history significant of morbid obesity, type 2 diabetes, chronic HFpEF,  hypertension and OSA on CPAP came to ED with complaint of left-sided weakness and dysarthria.   Patient went to bed around 10 PM and was at baseline, this morning when he woke up he was having left-sided weakness and difficulty speaking so EMS was called.   Patient was also having some shortness of breath and confusion.  He was hypoxic at 75% with EMS.  Brought in as code stroke.  Teleneurology was consulted.  He was out of window for thrombolytics.   At baseline patient morbidly obese and does not move around a lot.  Per family he was using CPAP at night.   Patient denies any recent illnesses, chest pain.  He does have some orthopnea and exertional dyspnea.  Some worsening lower extremity edema.  No recent change in appetite or bowel habits.  No urinary symptoms.  Significant Events: Admitted 02/14/2023 for acute CVA MRI showing acute cortical and subcortical infarction of the right posterior frontal lobe petechial blood products present in the region without focal hematoma.  Old right cerebral infarction, old left parietal cortical and subcortical infarction. On trilogy with elevated pCO2  TOC expanding search for rehab beds.  1/19.  Foley catheter removed 1/20.  Creatinine up at 1.67.  Will give fluid bolus.  Started low-dose Semglee insulin 1/21.  Creatinine 1.45.  With sugars elevated will increase Semglee insulin 25 units daily.  Assessment and Plan: * Acute ischemic stroke (HCC) On admission, Presented with left-sided weakness and some dysarthria when he woke up this morning, LKW around 10 PM last night, out of window for thrombolytic.  Initial CT head was negative for any acute abnormality, CTA  negative for LVO.  Aspirin and Plavix as DAPT, Continue home Lipitor 02-15-2023 MRI brain confirms Acute cortical and subcortical infarction in the right posterior frontal lobe. Petechial blood products present in the region without focal hematoma. Pt started on DAPT.  Aspirin for 21 days total with Plavix then Plavix monotherapy after that. Physical therapy recommending rehab.  Today walked 4 feet with help of 2 people. Patient able to move his left arm and leg better than when he came in.  Pharyngeal dysphagia Advance to solid food on 1/18  Dysarthria due to acute cerebrovascular accident (CVA) (HCC) Advance to solid food on 1/18.  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  Acute on chronic respiratory failure with hypoxia and hypercapnia (HCC) - suppose to have home Trilogy machine since March 2015 On admission, Found to have hypoxia and hypercarbia.  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  Patient was using CPAP at home, not sure about the compliance.  He was placed on BiPAP in ED. chest x-ray with concern of pulmonary vascular congestion. uses 2 L of oxygen at baseline -Use BiPAP at night and while taking a nap. -Continue with supplemental oxygen-keep the saturation above 90% -Incentive spirometry 02-17-2023 continue with home trilogy at night. Now that his family has brought it to hospital.  AKI (acute kidney injury) (HCC) Creatinine 2.01 on presentation.  Creatinine 1.24 on 1/18.  Creatinine on 1/20 is 1.67.  Given a fluid bolus.  Today's creatinine 1.45.  Continue to monitor  without Foley catheter.  Acute on chronic heart failure with preserved ejection fraction (HFpEF) (HCC) EF of 55%, patient on Coreg 25 mg twice a day, Imdur 60 mg daily patient not on any water pill at this point.   Essential hypertension Continue Coreg and Imdur  Type 2 diabetes mellitus (HCC) Hemoglobin A1c 6.7.  Patient on  sliding scale insulin here.  Will increase Semglee insulin 25 units at night since the patient's sugars are up in the 300s currently.    OSA treated with Trilogy machine Continue trilogy machine  Morbid obesity with BMI of 50.0-59.9, adult (HCC) BMI is 57.66.  Iron deficiency anemia On multivitamin with iron.  Received IV iron on 1/20.  Last hemoglobin 9.3.        Subjective: Patient feels okay.  Admitted with stroke.  He is moving his left side a lot better than when he came in.  Physical Exam: Vitals:   02/21/23 0000 02/21/23 0400 02/21/23 0748 02/21/23 1252  BP: (!) 119/52 (!) 138/54 (!) 149/72 (!) 121/48  Pulse:  60 (!) 58 (!) 56  Resp: 16 16 19 15   Temp: 97.8 F (36.6 C) 98.3 F (36.8 C) 98.4 F (36.9 C) 98.4 F (36.9 C)  TempSrc: Oral Oral Oral Oral  SpO2: 93% 93% 100%   Weight:      Height:       Physical Exam HENT:     Head: Normocephalic.     Mouth/Throat:     Pharynx: No oropharyngeal exudate.  Eyes:     General: Lids are normal.     Conjunctiva/sclera: Conjunctivae normal.  Cardiovascular:     Rate and Rhythm: Normal rate and regular rhythm.     Heart sounds: Normal heart sounds, S1 normal and S2 normal.  Pulmonary:     Breath sounds: Examination of the right-lower field reveals decreased breath sounds. Examination of the left-lower field reveals decreased breath sounds. Decreased breath sounds present. No wheezing, rhonchi or rales.  Abdominal:     Palpations: Abdomen is soft.     Tenderness: There is no abdominal tenderness.  Musculoskeletal:     Right lower leg: Swelling present.     Left lower leg: Swelling present.  Skin:    General: Skin is warm.     Comments: Dry skin on feet.  Neurological:     Mental Status: He is alert and oriented to person, place, and time.     Comments: Able to straight leg raise bilaterally.  Power 4 out of 5 bilateral upper and lower extremities.     Data Reviewed: Last 3 sugars 361, 313 and 348, creatinine 1.45,  hemoglobin 9.3  Family Communication: Spoke with daughter at the bedside  Disposition: Status is: Inpatient Remains inpatient appropriate because: TOC expanding bed search  Planned Discharge Destination: Rehab    Time spent: 28 minutes  Author: Alford Highland, MD 02/21/2023 4:11 PM  For on call review [email or web address].

## 2023-02-21 NOTE — Progress Notes (Signed)
Mobility Specialist - Progress Note   02/21/23 1428  Mobility  Activity Transferred from chair to bed  Level of Assistance Minimal assist, patient does 75% or more  Assistive Device Front wheel walker  Distance Ambulated (ft) 4 ft  Activity Response Tolerated well  Mobility visit 1 Mobility  Mobility Specialist Start Time (ACUTE ONLY) 1306  Mobility Specialist Stop Time (ACUTE ONLY) 1320  Mobility Specialist Time Calculation (min) (ACUTE ONLY) 14 min   Pt sitting in the recliner upon arrival, utilizing [place]. Pt STS to RW ModA +2, extra time required to bring BLE closer to person upon standing. Pt transferred from chair to bed MinA, requiring min cuing for sequencing. Pt left in fowler position with lunch tray present.   Zetta Bills Mobility Specialist 02/21/23 2:30 PM

## 2023-02-21 NOTE — Progress Notes (Signed)
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 Few scattered old small vessel infarctions  in both hemispheres affecting the white matter.    PMH significant for morbid obesity, type 2 diabetes, chronic HFpEF,  hypertension and OSA on CPAP   OT comments  Pt seen for OT tx and cotx with PT to optimize ADL mobility attempts. Pt alert, agreeable to session and motivated to participate in order to improve function. Pt required overall less assist with bed mobility, transfers, and grooming tasks. Pt demonstrating progress and required VC for PLB to support breath recovery. Continues to benefit.       If plan is discharge home, recommend the following:  Two people to help with walking and/or transfers;Two people to help with bathing/dressing/bathroom;Assistance with cooking/housework;Assist for transportation;Help with stairs or ramp for entrance;Assistance with feeding   Equipment Recommendations  Other (comment) (defer)    Recommendations for Other Services      Precautions / Restrictions Precautions Precautions: Fall Restrictions Weight Bearing Restrictions Per Provider Order: No       Mobility Bed Mobility Overal bed mobility: Needs Assistance Bed Mobility: Supine to Sit     Supine to sit: Min assist, Used rails, HOB elevated     General bed mobility comments: good effort and ability to transition    Transfers Overall transfer level: Needs assistance Equipment used: Rolling walker (2 wheels) Transfers: Sit to/from Stand Sit to  Stand: Min assist, Mod assist, +2 physical assistance           General transfer comment: increased time and effort from recliner but overall does well     Balance Overall balance assessment: Needs assistance Sitting-balance support: Bilateral upper extremity supported Sitting balance-Leahy Scale: Good     Standing balance support: Bilateral upper extremity supported Standing balance-Leahy Scale: Fair Standing balance comment: reliance on walker but overall does well                           ADL either performed or assessed with clinical judgement   ADL Overall ADL's : Needs assistance/impaired     Grooming: Bed level;Oral care;Wash/dry face;Set up                                      Extremity/Trunk Assessment              Vision       Perception     Praxis      Cognition Arousal: Alert Behavior During Therapy: WFL for tasks assessed/performed Overall Cognitive Status: Within Functional Limits for tasks assessed                                 General Comments: motivated to participate and follows commands well        Exercises Other Exercises Other Exercises: Pt educated in PLB and required cues during session to utilize    Shoulder Instructions  General Comments      Pertinent Vitals/ Pain       Pain Assessment Pain Assessment: No/denies pain  Home Living                                          Prior Functioning/Environment              Frequency  Min 1X/week        Progress Toward Goals  OT Goals(current goals can now be found in the care plan section)  Progress towards OT goals: Progressing toward goals  Acute Rehab OT Goals Patient Stated Goal: improve function OT Goal Formulation: With patient Time For Goal Achievement: 03/01/23 Potential to Achieve Goals: Good  Plan      Co-evaluation    PT/OT/SLP Co-Evaluation/Treatment: Yes Reason for  Co-Treatment: For patient/therapist safety;To address functional/ADL transfers PT goals addressed during session: Mobility/safety with mobility;Proper use of DME OT goals addressed during session: ADL's and self-care      AM-PAC OT "6 Clicks" Daily Activity     Outcome Measure   Help from another person eating meals?: A Little Help from another person taking care of personal grooming?: A Little Help from another person toileting, which includes using toliet, bedpan, or urinal?: A Lot Help from another person bathing (including washing, rinsing, drying)?: A Lot Help from another person to put on and taking off regular upper body clothing?: A Little Help from another person to put on and taking off regular lower body clothing?: A Lot 6 Click Score: 15    End of Session Equipment Utilized During Treatment: Oxygen;Rolling walker (2 wheels)  OT Visit Diagnosis: Unsteadiness on feet (R26.81);Muscle weakness (generalized) (M62.81);Other abnormalities of gait and mobility (R26.89);Other symptoms and signs involving the nervous system (R29.898);Cognitive communication deficit (R41.841);Hemiplegia and hemiparesis Hemiplegia - Right/Left: Left Hemiplegia - dominant/non-dominant: Dominant   Activity Tolerance Patient tolerated treatment well   Patient Left in chair;with call bell/phone within reach;with chair alarm set   Nurse Communication          Time: 4034-7425 OT Time Calculation (min): 24 min  Charges: OT General Charges $OT Visit: 1 Visit OT Treatments $Self Care/Home Management : 8-22 mins  Arman Filter., MPH, MS, OTR/L ascom [phone] 02/21/23, 1:19 PM

## 2023-02-21 NOTE — Progress Notes (Signed)
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 Few scattered old small vessel infarctions  in both hemispheres affecting the white matter.    PMH significant for morbid obesity, type 2 diabetes, chronic HFpEF,  hypertension and OSA on CPAP    PT Comments  Ready and motivated for co-tx session.  He is able to transition to sitting EOB with bed features and min a x 1. Good effort.  He is steady in sitting and stands with RW and min/mod a x 2.  Transfers to recliner at bedside and does take a few steps forward today but limited by LE weakness and requests to sit showing awareness of his limitations.  After seated rest - O2 at 4LPM and he does destat to low 80's with increased time to recover.  He does stand an additional time for static standing and pre-gait activities.  ADL's with OT during rest.  Pt states his legs "feel like logs".  Pt remains motivated during session with daily progress.     If plan is discharge home, recommend the following: Two people to help with walking and/or transfers;Two people to help with bathing/dressing/bathroom;Assistance with cooking/housework;Assist for transportation;Help with stairs or ramp for entrance   Can travel by private vehicle        Equipment Recommendations       Recommendations for Other Services       Precautions / Restrictions Precautions Precautions: Fall Restrictions Weight Bearing Restrictions Per Provider Order: No     Mobility  Bed Mobility Overal bed mobility: Needs Assistance Bed Mobility:  Supine to Sit     Supine to sit: Min assist, Used rails, HOB elevated     General bed mobility comments: good effort and ability to transition Patient Response: Cooperative  Transfers Overall transfer level: Needs assistance Equipment used: Rolling walker (2 wheels) Transfers: Sit to/from Stand Sit to Stand: Min assist, Mod assist, +2 physical assistance           General transfer comment: increased time and effort from recliner but overall does well    Ambulation/Gait Ambulation/Gait assistance: +2 physical assistance, Min assist Gait Distance (Feet): 4 Feet Assistive device: Rolling walker (2 wheels) Gait Pattern/deviations: Step-to pattern Gait velocity: dec     General Gait Details: good quality steps to recliner but limited by LE weakness.   Stairs             Wheelchair Mobility     Tilt Bed Tilt Bed Patient Response: Cooperative  Modified Rankin (Stroke Patients Only)       Balance Overall balance assessment: Needs assistance Sitting-balance support: Bilateral upper extremity supported Sitting balance-Leahy Scale: Good     Standing balance support: Bilateral upper extremity supported Standing balance-Leahy Scale: Fair Standing balance comment: reliance on walker but overall does well                            Cognition Arousal: Alert Behavior During Therapy: WFL for tasks assessed/performed Overall Cognitive Status: Within Functional Limits for tasks assessed  Following Commands: Follows multi-step commands consistently                Exercises Other Exercises Other Exercises: stands an additional time for pre-gait activities from recliner    General Comments        Pertinent Vitals/Pain Pain Assessment Pain Assessment: No/denies pain Pain Descriptors / Indicators: Aching Pain Intervention(s): Limited activity within patient's tolerance    Home Living                           Prior Function            PT Goals (current goals can now be found in the care plan section) Progress towards PT goals: Progressing toward goals    Frequency    Min 1X/week      PT Plan      Co-evaluation PT/OT/SLP Co-Evaluation/Treatment: Yes   PT goals addressed during session: Mobility/safety with mobility;Proper use of DME OT goals addressed during session: ADL's and self-care      AM-PAC PT "6 Clicks" Mobility   Outcome Measure  Help needed turning from your back to your side while in a flat bed without using bedrails?: A Lot Help needed moving from lying on your back to sitting on the side of a flat bed without using bedrails?: A Lot Help needed moving to and from a bed to a chair (including a wheelchair)?: A Lot Help needed standing up from a chair using your arms (e.g., wheelchair or bedside chair)?: A Lot Help needed to walk in hospital room?: A Little Help needed climbing 3-5 steps with a railing? : Total 6 Click Score: 12    End of Session Equipment Utilized During Treatment: Oxygen Activity Tolerance: Patient tolerated treatment well Patient left: with bed alarm set;with call bell/phone within reach;with nursing/sitter in room Nurse Communication: Mobility status PT Visit Diagnosis: Muscle weakness (generalized) (M62.81);Difficulty in walking, not elsewhere classified (R26.2);Unsteadiness on feet (R26.81);Hemiplegia and hemiparesis;Other symptoms and signs involving the nervous system (R29.898) Hemiplegia - Right/Left: Left Hemiplegia - dominant/non-dominant: Dominant Hemiplegia - caused by: Cerebral infarction     Time: 1012-1037 PT Time Calculation (min) (ACUTE ONLY): 25 min  Charges:    $Gait Training: 8-22 mins PT General Charges $$ ACUTE PT VISIT: 1 Visit                   Danielle Dess, PTA 02/21/23, 11:36 AM

## 2023-02-22 DIAGNOSIS — J9621 Acute and chronic respiratory failure with hypoxia: Secondary | ICD-10-CM | POA: Diagnosis not present

## 2023-02-22 DIAGNOSIS — E1165 Type 2 diabetes mellitus with hyperglycemia: Secondary | ICD-10-CM

## 2023-02-22 DIAGNOSIS — I639 Cerebral infarction, unspecified: Secondary | ICD-10-CM | POA: Diagnosis not present

## 2023-02-22 DIAGNOSIS — I69952 Hemiplegia and hemiparesis following unspecified cerebrovascular disease affecting left dominant side: Secondary | ICD-10-CM | POA: Diagnosis not present

## 2023-02-22 DIAGNOSIS — R1313 Dysphagia, pharyngeal phase: Secondary | ICD-10-CM | POA: Diagnosis not present

## 2023-02-22 LAB — BASIC METABOLIC PANEL
Anion gap: 9 (ref 5–15)
BUN: 48 mg/dL — ABNORMAL HIGH (ref 6–20)
CO2: 29 mmol/L (ref 22–32)
Calcium: 8.4 mg/dL — ABNORMAL LOW (ref 8.9–10.3)
Chloride: 97 mmol/L — ABNORMAL LOW (ref 98–111)
Creatinine, Ser: 1.57 mg/dL — ABNORMAL HIGH (ref 0.61–1.24)
GFR, Estimated: 50 mL/min — ABNORMAL LOW (ref >60)
Glucose, Bld: 379 mg/dL — ABNORMAL HIGH (ref 70–99)
Potassium: 4.9 mmol/L (ref 3.5–5.1)
Sodium: 135 mmol/L (ref 135–145)

## 2023-02-22 LAB — GLUCOSE, CAPILLARY
Glucose-Capillary: 342 mg/dL — ABNORMAL HIGH (ref 70–99)
Glucose-Capillary: 379 mg/dL — ABNORMAL HIGH (ref 70–99)
Glucose-Capillary: 388 mg/dL — ABNORMAL HIGH (ref 70–99)
Glucose-Capillary: 247 mg/dL — ABNORMAL HIGH (ref 70–99)

## 2023-02-22 LAB — HEMOGLOBIN: Hemoglobin: 9.6 g/dL — ABNORMAL LOW (ref 13.0–17.0)

## 2023-02-22 MED ORDER — INSULIN GLARGINE-YFGN 100 UNIT/ML ~~LOC~~ SOLN
20.0000 [IU] | Freq: Two times a day (BID) | SUBCUTANEOUS | Status: DC
  Administered 2023-02-22 (×2): 20 [IU] via SUBCUTANEOUS
  Filled 2023-02-22 (×3): qty 0.2

## 2023-02-22 MED ORDER — TORSEMIDE 20 MG PO TABS
40.0000 mg | ORAL_TABLET | Freq: Every day | ORAL | Status: DC
  Administered 2023-02-22 – 2023-02-27 (×6): 40 mg via ORAL
  Filled 2023-02-22 (×6): qty 2

## 2023-02-22 MED ORDER — EMPAGLIFLOZIN 25 MG PO TABS
25.0000 mg | ORAL_TABLET | Freq: Every day | ORAL | Status: DC
  Administered 2023-02-22 – 2023-02-27 (×6): 25 mg via ORAL
  Filled 2023-02-22 (×6): qty 1

## 2023-02-22 MED ORDER — INSULIN GLARGINE-YFGN 100 UNIT/ML ~~LOC~~ SOLN
15.0000 [IU] | Freq: Two times a day (BID) | SUBCUTANEOUS | Status: DC
  Filled 2023-02-22: qty 0.15

## 2023-02-22 MED ORDER — INSULIN ASPART 100 UNIT/ML IJ SOLN
6.0000 [IU] | Freq: Three times a day (TID) | INTRAMUSCULAR | Status: DC
  Administered 2023-02-22 – 2023-02-25 (×11): 6 [IU] via SUBCUTANEOUS
  Filled 2023-02-22 (×10): qty 1

## 2023-02-22 NOTE — Inpatient Diabetes Management (Signed)
Inpatient Diabetes Program Recommendations  AACE/ADA: New Consensus Statement on Inpatient Glycemic Control (2015)  Target Ranges:  Prepandial:   less than 140 mg/dL      Peak postprandial:   less than 180 mg/dL (1-2 hours)      Critically ill patients:  140 - 180 mg/dL   Lab Results  Component Value Date   GLUCAP 342 (H) 02/22/2023   HGBA1C 6.7 (H) 02/14/2023    Review of Glycemic Control  Latest Reference Range & Units 02/21/23 12:13 02/21/23 16:10 02/21/23 20:27 02/22/23 07:21  Glucose-Capillary 70 - 99 mg/dL 147 (H) 829 (H) 562 (H) 342 (H)  Diabetes history: DM  Outpatient Diabetes medications:  Metformin 1000 mg bid Lantus 60 units bid Novolog tid with meals  Current orders for Inpatient glycemic control:  Novolog 0-20 units tid with meals and HS Semglee 15 units bid Novolog 6 units tid with meals  Inpatient Diabetes Program Recommendations:    Consider increasing Semglee to 20 units bid.   Thanks,  Lorenza Cambridge, RN, BC-ADM Inpatient Diabetes Coordinator Pager [phone]  (8a-5p)

## 2023-02-22 NOTE — TOC Progression Note (Signed)
Transition of Care [facility]) - Progression Note    Patient Details  Name: Raymond [name] MRN: [id number] Date of Birth: 07-May-1965  Transition [facility] CM/SW Contact  Garret Reddish, RN Phone Number: 02/22/2023, 3:28 PM  Clinical Narrative:    I have meet with patient at bedside.  I have informed him that [facility] can not accept him how they have a sister facility in [place] that can accept him.  He reports that [facility] is too far away and he would like to be place in [place], [place].  I have informed that currently [facility] has no bed offers. He reports that he will only go to a facility in [place] at this time.   TOC will continue to follow for discharge planning.     Expected Discharge Plan: Skilled Nursing Facility Barriers to Discharge: Continued Medical Work up  Expected Discharge Plan and Services     Post Acute Care Choice: Skilled Nursing Facility Living arrangements for the past 2 months: Apartment                                       Social Determinants of Health (SDOH) Interventions SDOH Screenings   Food Insecurity: No Food Insecurity (02/15/2023)  Housing: Low Risk  (02/17/2023)  Transportation Needs: No Transportation Needs (02/15/2023)  Utilities: Not At Risk (02/15/2023)  Tobacco Use: Low Risk  (02/14/2023)    Readmission Risk Interventions     No data to display

## 2023-02-22 NOTE — Progress Notes (Signed)
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 Few scattered old small vessel infarctions  in both hemispheres affecting the white matter.    PMH significant for morbid obesity, type 2 diabetes, chronic HFpEF,  hypertension and OSA on CPAP    PT Comments  Pt sleeping upon arrival but wakes easily to voice, agreeable to PT. Pt showed good motivation throughout. Supine to sit with minA and use of bed rails, extra time. Able to sit with supervision for several minutes during rest breaks, and able to reposition at EOB with CGA. Sit <> stand twice with RW and modAx2, but once up on his feet assistance decreased to CGA. Able to ambulate ~62ft, twice with RW. Did fatigue quickly and some SOB noted, but spO2 >90% on 4L. Marland KitchenReturned to supine with needs in reach. The patient would benefit from further skilled PT intervention to continue to progress towards goals.    If plan is discharge home, recommend the following: Two people to help with walking and/or transfers;Two people to help with bathing/dressing/bathroom;Assistance with cooking/housework;Assist for transportation;Help with stairs or ramp for entrance   Can travel by private vehicle        Equipment Recommendations       Recommendations for Other Services       Precautions / Restrictions Precautions Precautions: Fall Restrictions Weight Bearing Restrictions Per Provider Order: No     Mobility  Bed Mobility Overal bed mobility: Needs Assistance Bed Mobility: Supine to Sit     Supine to sit: Min  assist, Used rails, HOB elevated Sit to supine: +2 for physical assistance, Min assist   General bed mobility comments: good effort and ability to transition    Transfers Overall transfer level: Needs assistance Equipment used: Rolling walker (2 wheels) Transfers: Sit to/from Stand Sit to Stand: Mod assist, +2 physical assistance           General transfer comment: from lower EOB    Ambulation/Gait Ambulation/Gait assistance: +2 physical assistance, Min assist Gait Distance (Feet): 8 Feet (2 bouts of 4 ft) Assistive device: Rolling walker (2 wheels)         General Gait Details: able to stand and march twice, walk forwards/backwards/L ~68ft, twice   Stairs             Wheelchair Mobility     Tilt Bed    Modified Rankin (Stroke Patients Only)       Balance Overall balance assessment: Needs assistance Sitting-balance support: Bilateral upper extremity supported Sitting balance-Leahy Scale: Good Sitting balance - Comments: able tos it unsupported today on air mattress and correct positioning on his own with extra time, use of bed features   Standing balance support: Bilateral upper extremity supported Standing balance-Leahy Scale: Fair                              Cognition Arousal: Alert Behavior During Therapy: WFL for tasks assessed/performed Overall Cognitive Status: Within Functional Limits for tasks assessed  Exercises      General Comments        Pertinent Vitals/Pain Pain Assessment Pain Assessment: No/denies pain    Home Living                          Prior Function            PT Goals (current goals can now be found in the care plan section) Progress towards PT goals: Progressing toward goals    Frequency    Min 1X/week      PT Plan      Co-evaluation              AM-PAC PT "6 Clicks" Mobility   Outcome Measure  Help needed  turning from your back to your side while in a flat bed without using bedrails?: A Lot Help needed moving from lying on your back to sitting on the side of a flat bed without using bedrails?: A Lot Help needed moving to and from a bed to a chair (including a wheelchair)?: A Lot Help needed standing up from a chair using your arms (e.g., wheelchair or bedside chair)?: A Lot Help needed to walk in hospital room?: A Little Help needed climbing 3-5 steps with a railing? : Total 6 Click Score: 12    End of Session Equipment Utilized During Treatment: Oxygen Activity Tolerance: Patient tolerated treatment well Patient left: with bed alarm set;with call bell/phone within reach;with nursing/sitter in room Nurse Communication: Mobility status PT Visit Diagnosis: Muscle weakness (generalized) (M62.81);Difficulty in walking, not elsewhere classified (R26.2);Unsteadiness on feet (R26.81);Hemiplegia and hemiparesis;Other symptoms and signs involving the nervous system (R29.898) Hemiplegia - Right/Left: Left Hemiplegia - dominant/non-dominant: Dominant Hemiplegia - caused by: Cerebral infarction     Time: 1610-9604 PT Time Calculation (min) (ACUTE ONLY): 23 min  Charges:    $Therapeutic Activity: 23-37 mins PT General Charges $$ ACUTE PT VISIT: 1 Visit                     Olga Coaster PT, DPT 4:03 PM,02/22/23

## 2023-02-22 NOTE — Progress Notes (Signed)
Progress Note   Patient: Raymond [name] ZOX:[id number] DOB: 04-20-1965 DOA: 02/14/2023     8 DOS: the patient was seen and examined on 02/22/2023   Brief hospital course: HPI: 58 y.o. male with medical history significant of morbid obesity, type 2 diabetes, chronic HFpEF,  hypertension and OSA on CPAP came to ED with complaint of left-sided weakness and dysarthria.   Patient went to bed around 10 PM and was at baseline, this morning when he woke up he was having left-sided weakness and difficulty speaking so EMS was called.   Patient was also having some shortness of breath and confusion.  He was hypoxic at 75% with EMS.  Brought in as code stroke.  Teleneurology was consulted.  He was out of window for thrombolytics.   At baseline patient morbidly obese and does not move around a lot.  Per family he was using CPAP at night.   Patient denies any recent illnesses, chest pain.  He does have some orthopnea and exertional dyspnea.  Some worsening lower extremity edema.  No recent change in appetite or bowel habits.  No urinary symptoms.  Significant Events: Admitted 02/14/2023 for acute CVA MRI showing acute cortical and subcortical infarction of the right posterior frontal lobe petechial blood products present in the region without focal hematoma.  Old right cerebral infarction, old left parietal cortical and subcortical infarction. On trilogy with elevated pCO2  TOC expanding search for rehab beds.  1/19.  Foley catheter removed 1/20.  Creatinine up at 1.67.  Will give fluid bolus.  Started low-dose Semglee insulin 1/21.  Creatinine 1.45.  With sugars elevated will increase Semglee insulin 25 units daily. 1/22: Creatinine 1.57, CBG remained elevated, increased Semglee to 20 units twice daily and added 6 units of NovoLog with meal.  Also restarting home torsemide due to worsening lower extremity edema.  Assessment and Plan: * Acute ischemic stroke (HCC) On admission, Presented  with left-sided weakness and some dysarthria when he woke up this morning, LKW around 10 PM last night, out of window for thrombolytic.  Initial CT head was negative for any acute abnormality, CTA negative for LVO.  Aspirin and Plavix as DAPT, Continue home Lipitor 02-15-2023 MRI brain confirms Acute cortical and subcortical infarction in the right posterior frontal lobe. Petechial blood products present in the region without focal hematoma. Pt started on DAPT.  Aspirin for 21 days total with Plavix then Plavix monotherapy after that. Physical therapy recommending rehab.  Today walked 4 feet with help of 2 people. Patient able to move his left arm and leg better than when he came in.  Pharyngeal dysphagia Advance to solid food on 1/18  Dysarthria due to acute cerebrovascular accident (CVA) (HCC) Advance to solid food on 1/18.  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  Acute on chronic respiratory failure with hypoxia and hypercapnia (HCC) - suppose to have home Trilogy machine since March 2015 On admission, Found to have hypoxia and hypercarbia.  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  Patient was using CPAP at home, not sure about the compliance.  He was placed on BiPAP in ED. chest x-ray with concern of pulmonary vascular congestion. uses 2 L of oxygen at baseline -Use BiPAP at night and while taking a nap. -Continue with supplemental oxygen-keep the saturation above 90% -Incentive spirometry 02-17-2023 continue with home trilogy at night. Now that his family has brought it to hospital.  AKI (  acute kidney injury) (HCC) Creatinine 2.01 on presentation.  Creatinine 1.24 on 1/18.  Creatinine with some fluctuation, currently at 1.57 -Monitor renal function -Avoid nephrotoxins  Acute on chronic heart failure with preserved ejection fraction (HFpEF) (HCC) EF of 55%, patient on Coreg 25 mg twice a day, Imdur 60 mg  daily. -Restarting home torsemide  Essential hypertension Continue Coreg and Imdur Adding home torsemide  Type 2 diabetes mellitus (HCC) Hemoglobin A1c 6.7.  CBG remained significantly elevated. -Increase Semglee to 20 units twice daily-patient takes 60 units at home -Adding 6 units with meal -Continue with SSI  OSA treated with Trilogy machine Continue trilogy machine  Morbid obesity with BMI of 50.0-59.9, adult (HCC) BMI is 57.66.  Iron deficiency anemia On multivitamin with iron.  Received IV iron on 1/20.  Last hemoglobin 9.3.   Subjective: Patient was seen and examined today.  No new concern, started moving his left extremities, stating that it is slowly improving.  Physical Exam: Vitals:   02/22/23 0011 02/22/23 0350 02/22/23 0756 02/22/23 1152  BP:  (!) 133/42 (!) 140/61 (!) 170/62  Pulse: (!) 58 60 68 82  Resp:  20 19 20   Temp:  98 F (36.7 C) 98.2 F (36.8 C) 98.4 F (36.9 C)  TempSrc:    Oral  SpO2: 100% 94% 96% 100%  Weight:  (!) 208.7 kg    Height:       General.  Morbidly obese gentleman, in no acute distress. Pulmonary.  Lungs clear bilaterally, normal respiratory effort. CV.  Regular rate and rhythm, no JVD, rub or murmur. Abdomen.  Soft, nontender, nondistended, BS positive. CNS.  Alert and oriented .  No new deficit, improving strength of left upper and lower extremities Extremities.  No edema, no cyanosis, pulses intact and symmetrical.   Data Reviewed: Prior data reviewed  Family Communication:   Disposition: Status is: Inpatient Remains inpatient appropriate because: TOC expanding bed search  Planned Discharge Destination: Rehab  DVT prophylaxis.  Subcu heparin Time spent: 40 minutes  This record has been created using Conservation officer, historic buildings. Errors have been sought and corrected,but may not always be located. Such creation errors do not reflect on the standard of care.   Author: Arnetha Courser, MD 02/22/2023 5:04 PM  For  on call review [email or web address].

## 2023-02-22 NOTE — Plan of Care (Signed)
  Problem: Education: Goal: Ability to describe self-care measures that may prevent or decrease complications (Diabetes Survival Skills Education) will improve Outcome: Progressing Goal: Individualized Educational Video(s) Outcome: Progressing   Problem: Coping: Goal: Ability to adjust to condition or change in health will improve Outcome: Progressing   Problem: Fluid Volume: Goal: Ability to maintain a balanced intake and output will improve Outcome: Progressing   Problem: Health Behavior/Discharge Planning: Goal: Ability to identify and utilize available resources and services will improve Outcome: Progressing Goal: Ability to manage health-related needs will improve Outcome: Progressing   Problem: Metabolic: Goal: Ability to maintain appropriate glucose levels will improve Outcome: Progressing   Problem: Nutritional: Goal: Maintenance of adequate nutrition will improve Outcome: Progressing Goal: Progress toward achieving an optimal weight will improve Outcome: Progressing   Problem: Skin Integrity: Goal: Risk for impaired skin integrity will decrease Outcome: Progressing   Problem: Tissue Perfusion: Goal: Adequacy of tissue perfusion will improve Outcome: Progressing   Problem: Education: Goal: Knowledge of disease or condition will improve Outcome: Progressing Goal: Knowledge of secondary prevention will improve (MUST DOCUMENT ALL) Outcome: Progressing Goal: Knowledge of patient specific risk factors will improve [name] N/A or DELETE if not current risk factor) Outcome: Progressing   Problem: Ischemic Stroke/TIA Tissue Perfusion: Goal: Complications of ischemic stroke/TIA will be minimized Outcome: Progressing   Problem: Coping: Goal: Will verbalize positive feelings about self Outcome: Progressing Goal: Will identify appropriate support needs Outcome: Progressing   Problem: Health Behavior/Discharge Planning: Goal: Ability to manage health-related needs  will improve Outcome: Progressing Goal: Goals will be collaboratively established with patient/family Outcome: Progressing   Problem: Self-Care: Goal: Ability to participate in self-care as condition permits will improve Outcome: Progressing Goal: Verbalization of feelings and concerns over difficulty with self-care will improve Outcome: Progressing Goal: Ability to communicate needs accurately will improve Outcome: Progressing   Problem: Nutrition: Goal: Risk of aspiration will decrease Outcome: Progressing Goal: Dietary intake will improve Outcome: Progressing   Problem: Education: Goal: Knowledge of General Education information will improve Description: Including pain rating scale, medication(s)/side effects and non-pharmacologic comfort measures Outcome: Progressing   Problem: Health Behavior/Discharge Planning: Goal: Ability to manage health-related needs will improve Outcome: Progressing

## 2023-02-22 NOTE — Plan of Care (Signed)
  Problem: Nutritional: Goal: Maintenance of adequate nutrition will improve Outcome: Progressing   Problem: Coping: Goal: Will verbalize positive feelings about self Outcome: Progressing   Problem: Self-Care: Goal: Ability to communicate needs accurately will improve Outcome: Progressing   Problem: Education: Goal: Ability to describe self-care measures that may prevent or decrease complications (Diabetes Survival Skills Education) will improve Outcome: Not Progressing   Problem: Coping: Goal: Ability to adjust to condition or change in health will improve Outcome: Not Progressing   Problem: Health Behavior/Discharge Planning: Goal: Ability to manage health-related needs will improve Outcome: Not Progressing   Problem: Metabolic: Goal: Ability to maintain appropriate glucose levels will improve Outcome: Not Progressing   Problem: Nutritional: Goal: Progress toward achieving an optimal weight will improve Outcome: Not Progressing   Problem: Skin Integrity: Goal: Risk for impaired skin integrity will decrease Outcome: Not Progressing

## 2023-02-22 NOTE — Plan of Care (Signed)
  Problem: Nutritional: Goal: Maintenance of adequate nutrition will improve Outcome: Progressing   Problem: Education: Goal: Knowledge of disease or condition will improve Outcome: Progressing Goal: Knowledge of secondary prevention will improve (MUST DOCUMENT ALL) Outcome: Progressing Goal: Knowledge of patient specific risk factors will improve [name] N/A or DELETE if not current risk factor) Outcome: Progressing   Problem: Ischemic Stroke/TIA Tissue Perfusion: Goal: Complications of ischemic stroke/TIA will be minimized Outcome: Progressing   Problem: Coping: Goal: Will verbalize positive feelings about self Outcome: Progressing   Problem: Self-Care: Goal: Ability to communicate needs accurately will improve Outcome: Progressing

## 2023-02-23 DIAGNOSIS — J9621 Acute and chronic respiratory failure with hypoxia: Secondary | ICD-10-CM | POA: Diagnosis not present

## 2023-02-23 DIAGNOSIS — R1313 Dysphagia, pharyngeal phase: Secondary | ICD-10-CM | POA: Diagnosis not present

## 2023-02-23 DIAGNOSIS — I639 Cerebral infarction, unspecified: Secondary | ICD-10-CM | POA: Diagnosis not present

## 2023-02-23 DIAGNOSIS — I69952 Hemiplegia and hemiparesis following unspecified cerebrovascular disease affecting left dominant side: Secondary | ICD-10-CM | POA: Diagnosis not present

## 2023-02-23 LAB — CBC
HCT: 32.8 % — ABNORMAL LOW (ref 39.0–52.0)
Hemoglobin: 9.4 g/dL — ABNORMAL LOW (ref 13.0–17.0)
MCH: 21.6 pg — ABNORMAL LOW (ref 26.0–34.0)
MCHC: 28.7 g/dL — ABNORMAL LOW (ref 30.0–36.0)
MCV: 75.2 fL — ABNORMAL LOW (ref 80.0–100.0)
Platelets: 330 10*3/uL (ref 150–400)
RBC: 4.36 MIL/uL (ref 4.22–5.81)
RDW: 17.8 % — ABNORMAL HIGH (ref 11.5–15.5)
WBC: 13.9 10*3/uL — ABNORMAL HIGH (ref 4.0–10.5)
nRBC: 0.9 % — ABNORMAL HIGH (ref 0.0–0.2)

## 2023-02-23 LAB — BASIC METABOLIC PANEL
Anion gap: 11 (ref 5–15)
BUN: 45 mg/dL — ABNORMAL HIGH (ref 6–20)
CO2: 28 mmol/L (ref 22–32)
Calcium: 8.6 mg/dL — ABNORMAL LOW (ref 8.9–10.3)
Chloride: 99 mmol/L (ref 98–111)
Creatinine, Ser: 1.53 mg/dL — ABNORMAL HIGH (ref 0.61–1.24)
GFR, Estimated: 52 mL/min — ABNORMAL LOW (ref >60)
Glucose, Bld: 255 mg/dL — ABNORMAL HIGH (ref 70–99)
Potassium: 4.7 mmol/L (ref 3.5–5.1)
Sodium: 138 mmol/L (ref 135–145)

## 2023-02-23 LAB — GLUCOSE, CAPILLARY
Glucose-Capillary: 259 mg/dL — ABNORMAL HIGH (ref 70–99)
Glucose-Capillary: 281 mg/dL — ABNORMAL HIGH (ref 70–99)
Glucose-Capillary: 271 mg/dL — ABNORMAL HIGH (ref 70–99)
Glucose-Capillary: 299 mg/dL — ABNORMAL HIGH (ref 70–99)
Glucose-Capillary: 284 mg/dL — ABNORMAL HIGH (ref 70–99)

## 2023-02-23 MED ORDER — INSULIN GLARGINE-YFGN 100 UNIT/ML ~~LOC~~ SOLN
25.0000 [IU] | Freq: Two times a day (BID) | SUBCUTANEOUS | Status: DC
Start: 2023-02-23 — End: 2023-02-24
  Administered 2023-02-23 (×2): 25 [IU] via SUBCUTANEOUS
  Filled 2023-02-23 (×3): qty 0.25

## 2023-02-23 NOTE — Progress Notes (Signed)
Mobility Specialist - Progress Note  During mobility: HR 74, SpO2 91% Post-mobility: HR 71, SPO2 99%   02/23/23 1014  Mobility  Activity Stood at bedside;Transferred from bed to chair  Level of Assistance Minimal assist, patient does 75% or more  Assistive Device Front wheel walker  Distance Ambulated (ft) 4 ft  Activity Response Tolerated well  Mobility visit 1 Mobility  Mobility Specialist Start Time (ACUTE ONLY) 0930  Mobility Specialist Stop Time (ACUTE ONLY) 0945  Mobility Specialist Time Calculation (min) (ACUTE ONLY) 15 min   Pt supine upon entry, utilizing 4L. Pt visibly sleepy however agreeable and motivated to transfer to the recliner this date. Pt completed bed mob HHA to bring trunk from sup to sit, dangled EOB ~2 mins while gathering self O2 >90%. Pt STS to RW ModA of one--- extra time required upon standing to bring BLE closer to person, MinA to weight shift L upon standing to clear RW for RUE. Pt transferred to the recliner MinA, cuing for pursed lip breathing throughout transfer. Upon sitting in the recliner, Pt desat to 91% w/ exhaustion. Pt left seated in the recliner with needs within reach, family present at bedside. RN notified.    Zetta Bills Mobility Specialist 02/23/23 10:30 AM

## 2023-02-23 NOTE — Progress Notes (Signed)
Progress Note   Patient: Raymond [name] VOZ:[id number] DOB: 09/11/1965 DOA: 02/14/2023     9 DOS: the patient was seen and examined on 02/23/2023   Brief hospital course: HPI: 58 y.o. male with medical history significant of morbid obesity, type 2 diabetes, chronic HFpEF,  hypertension and OSA on CPAP came to ED with complaint of left-sided weakness and dysarthria.   Patient went to bed around 10 PM and was at baseline, this morning when he woke up he was having left-sided weakness and difficulty speaking so EMS was called.   Patient was also having some shortness of breath and confusion.  He was hypoxic at 75% with EMS.  Brought in as code stroke.  Teleneurology was consulted.  He was out of window for thrombolytics.   At baseline patient morbidly obese and does not move around a lot.  Per family he was using CPAP at night.   Patient denies any recent illnesses, chest pain.  He does have some orthopnea and exertional dyspnea.  Some worsening lower extremity edema.  No recent change in appetite or bowel habits.  No urinary symptoms.  Significant Events: Admitted 02/14/2023 for acute CVA MRI showing acute cortical and subcortical infarction of the right posterior frontal lobe petechial blood products present in the region without focal hematoma.  Old right cerebral infarction, old left parietal cortical and subcortical infarction. On trilogy with elevated pCO2  TOC expanding search for rehab beds.  1/19.  Foley catheter removed 1/20.  Creatinine up at 1.67.  Will give fluid bolus.  Started low-dose Semglee insulin 1/21.  Creatinine 1.45.  With sugars elevated will increase Semglee insulin 25 units daily. 1/22: Creatinine 1.57, CBG remained elevated, increased Semglee to 20 units twice daily and added 6 units of NovoLog with meal.  Also restarting home torsemide due to worsening lower extremity edema. 1/23: Slowly improving creatinine, CBG remained elevated, increasing Semglee to  25 units twice daily, still awaiting disposition  Assessment and Plan: * Acute ischemic stroke (HCC) On admission, Presented with left-sided weakness and some dysarthria when he woke up this morning, LKW around 10 PM last night, out of window for thrombolytic.  Initial CT head was negative for any acute abnormality, CTA negative for LVO.  Aspirin and Plavix as DAPT, Continue home Lipitor 02-15-2023 MRI brain confirms Acute cortical and subcortical infarction in the right posterior frontal lobe. Petechial blood products present in the region without focal hematoma. Pt started on DAPT.  Aspirin for 21 days total with Plavix then Plavix monotherapy after that. Physical therapy recommending rehab.  Today walked 4 feet with help of 2 people. Patient able to move his left arm and leg better than when he came in.  Pharyngeal dysphagia Advance to solid food on 1/18  Dysarthria due to acute cerebrovascular accident (CVA) (HCC) Advance to solid food on 1/18.  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  Acute on chronic respiratory failure with hypoxia and hypercapnia (HCC) - suppose to have home Trilogy machine since March 2015 On admission, Found to have hypoxia and hypercarbia.  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  Patient was using CPAP at home, not sure about the compliance.  He was placed on BiPAP in ED. chest x-ray with concern of pulmonary vascular congestion. uses 2 L of oxygen at baseline -Use BiPAP at night and while taking a nap. -Continue with supplemental oxygen-keep the saturation above 90% -Incentive spirometry 02-17-2023  continue with home trilogy at night. Now that his family has brought it to hospital.  AKI (acute kidney injury) (HCC) Creatinine 2.01 on presentation.  Creatinine 1.24 on 1/18.  Creatinine with some fluctuation, currently at 1.57 -Monitor renal function -Avoid nephrotoxins  Acute on  chronic heart failure with preserved ejection fraction (HFpEF) (HCC) EF of 55%, patient on Coreg 25 mg twice a day, Imdur 60 mg daily. -Restarting home torsemide  Essential hypertension Continue Coreg and Imdur Adding home torsemide  Type 2 diabetes mellitus (HCC) Hemoglobin A1c 6.7.  CBG remained  elevated. -Increase Semglee to 25 units twice daily-patient takes 60 units at home -Continue 6 units with meal -Continue with SSI  OSA treated with Trilogy machine Continue trilogy machine  Morbid obesity with BMI of 50.0-59.9, adult (HCC) BMI is 57.66.  Iron deficiency anemia On multivitamin with iron.  Received IV iron on 1/20.  Last hemoglobin 9.3.   Subjective: Patient was sitting in chair when seen today.  Improving left-sided weakness.  Physical Exam: Vitals:   02/23/23 0020 02/23/23 0353 02/23/23 0419 02/23/23 0933  BP: (!) 115/48  (!) 140/60 132/63  Pulse: 62  65 62  Resp: 20  18 20   Temp: 98.3 F (36.8 C)  98.8 F (37.1 C) 98.2 F (36.8 C)  TempSrc:    Oral  SpO2: 97%  100% 98%  Weight:  (!) 192.4 kg    Height:       General.  Obese gentleman, in no acute distress. Pulmonary.  Lungs clear bilaterally, normal respiratory effort. CV.  Regular rate and rhythm, no JVD, rub or murmur. Abdomen.  Soft, nontender, nondistended, BS positive. CNS.  Alert and oriented .  No focal neurologic deficit. Extremities.  No edema, no cyanosis, pulses intact and symmetrical.   Data Reviewed: Prior data reviewed  Family Communication:   Disposition: Status is: Inpatient Remains inpatient appropriate because: TOC expanding bed search  Planned Discharge Destination: Rehab  DVT prophylaxis.  Subcu heparin Time spent: 40 minutes  This record has been created using Conservation officer, historic buildings. Errors have been sought and corrected,but may not always be located. Such creation errors do not reflect on the standard of care.   Author: Arnetha Courser, MD 02/23/2023 3:41  PM  For on call review [email or web address].

## 2023-02-24 DIAGNOSIS — J9621 Acute and chronic respiratory failure with hypoxia: Secondary | ICD-10-CM | POA: Diagnosis not present

## 2023-02-24 DIAGNOSIS — I639 Cerebral infarction, unspecified: Secondary | ICD-10-CM | POA: Diagnosis not present

## 2023-02-24 DIAGNOSIS — I69952 Hemiplegia and hemiparesis following unspecified cerebrovascular disease affecting left dominant side: Secondary | ICD-10-CM | POA: Diagnosis not present

## 2023-02-24 DIAGNOSIS — R1313 Dysphagia, pharyngeal phase: Secondary | ICD-10-CM | POA: Diagnosis not present

## 2023-02-24 LAB — GLUCOSE, CAPILLARY
Glucose-Capillary: 180 mg/dL — ABNORMAL HIGH (ref 70–99)
Glucose-Capillary: 206 mg/dL — ABNORMAL HIGH (ref 70–99)
Glucose-Capillary: 179 mg/dL — ABNORMAL HIGH (ref 70–99)
Glucose-Capillary: 235 mg/dL — ABNORMAL HIGH (ref 70–99)
Glucose-Capillary: 231 mg/dL — ABNORMAL HIGH (ref 70–99)

## 2023-02-24 MED ORDER — INSULIN GLARGINE-YFGN 100 UNIT/ML ~~LOC~~ SOLN
30.0000 [IU] | Freq: Two times a day (BID) | SUBCUTANEOUS | Status: DC
  Administered 2023-02-24 – 2023-02-25 (×3): 30 [IU] via SUBCUTANEOUS
  Filled 2023-02-24 (×4): qty 0.3

## 2023-02-24 NOTE — Progress Notes (Signed)
Occupational Therapy Treatment Patient Details Name: Raymond [name] MRN: [id number] DOB: [birth year] Today's Date: 02/24/2023   History of present illness Pt is a 58 year old male presented to the ED with L sided weakness and dysarthria, MRI showing  Acute cortical and subcortical infarction in the right posterior  frontal lobe. 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 Few scattered old small vessel infarctions  in both hemispheres affecting the white matter.    PMH significant for morbid obesity, type 2 diabetes, chronic HFpEF,  hypertension and OSA on CPAP   OT comments  Pt seen for OT tx and co-tx with PT to optimize safety with ADL/mobility. Pt required MIN A for sup>sit, MAX A to don socks, initial MIN-MOD A +2 to stand from EOB and improving to MIN A +2 for STS from recliner x2. Once in standing pt requires CGA and chair follow/+2 for safety. Limited activity tolerance continues and during bouts of mobility pt encouraged to use PLB with education in sequencing and activity pacing to improve pt's breath recovery. SpO2 remained >90% with exertion on 4-6L O2, improving to high 90's at rest. Pt endorsed at end of session after mobility to the door of his room his perceived rate of exertion was 7/10. Pt continues to benefit from skilled OT services. Continues to demo progress towards goals and remains motivated.       If plan is discharge home, recommend the following:  Two people to help with walking and/or transfers;Two people to help with bathing/dressing/bathroom;Assistance with cooking/housework;Assist for transportation;Help with stairs or ramp for entrance   Equipment Recommendations  Other (comment) (defer)    Recommendations for Other Services      Precautions / Restrictions Precautions Precautions: Fall Restrictions Weight Bearing Restrictions Per  Provider Order: No       Mobility Bed Mobility Overal bed mobility: Needs Assistance Bed Mobility: Supine to Sit     Supine to sit: Min assist, Used rails, HOB elevated          Transfers Overall transfer level: Needs assistance Equipment used: Rolling walker (2 wheels) Transfers: Sit to/from Stand Sit to Stand: Min assist, Mod assist, +2 physical assistance           General transfer comment: MIN-MOD A +2 for initial STS from EOB, improving to MIN A +2 from recliner     Balance Overall balance assessment: Needs assistance Sitting-balance support: No upper extremity supported, Feet supported Sitting balance-Leahy Scale: Good     Standing balance support: Bilateral upper extremity supported, Reliant on assistive device for balance Standing balance-Leahy Scale: Fair                             ADL either performed or assessed with clinical judgement   ADL Overall ADL's : Needs assistance/impaired                         Toilet Transfer: BSC/3in1;Rolling walker (2 wheels);Contact guard assist;+2 for safety/equipment Toilet Transfer Details (indicate cue type and reason): once in standing from recliner, CGA+2 for step pivot with VC for hand placement on BSC   Toileting - Clothing Manipulation Details (indicate cue type and reason): MAX A in standing for attempt at urinal use, MAX A for standing pericare     Functional mobility during ADLs: +2 for safety/equipment;Contact guard assist;Rolling walker (2 wheels)  Extremity/Trunk Assessment              Vision       Perception     Praxis      Cognition Arousal: Alert Behavior During Therapy: WFL for tasks assessed/performed Overall Cognitive Status: Within Functional Limits for tasks assessed                                 General Comments: pt motivated to participate        Exercises Other Exercises Other Exercises: During bouts of mobility pt encouraged  to use PLB with education in sequencing and activity pacing to improve pt's breath recovery.    Shoulder Instructions       General Comments SpO2 on 4-6L with mobility, remaining above 92% improving to >98% with rest and VC for PLB    Pertinent Vitals/ Pain       Pain Assessment Pain Assessment: No/denies pain  Home Living                                          Prior Functioning/Environment              Frequency  Min 1X/week        Progress Toward Goals  OT Goals(current goals can now be found in the care plan section)  Progress towards OT goals: Progressing toward goals  Acute Rehab OT Goals Patient Stated Goal: improve function OT Goal Formulation: With patient Time For Goal Achievement: 03/01/23 Potential to Achieve Goals: Good  Plan      Co-evaluation    PT/OT/SLP Co-Evaluation/Treatment: Yes Reason for Co-Treatment: For patient/therapist safety;To address functional/ADL transfers PT goals addressed during session: Mobility/safety with mobility;Proper use of DME;Balance OT goals addressed during session: ADL's and self-care;Proper use of Adaptive equipment and DME      AM-PAC OT "6 Clicks" Daily Activity     Outcome Measure   Help from another person eating meals?: None Help from another person taking care of personal grooming?: A Little Help from another person toileting, which includes using toliet, bedpan, or urinal?: A Lot Help from another person bathing (including washing, rinsing, drying)?: A Lot Help from another person to put on and taking off regular upper body clothing?: A Little Help from another person to put on and taking off regular lower body clothing?: A Lot 6 Click Score: 16    End of Session Equipment Utilized During Treatment: Oxygen;Rolling walker (2 wheels)  OT Visit Diagnosis: Unsteadiness on feet (R26.81);Muscle weakness (generalized) (M62.81);Other abnormalities of gait and mobility (R26.89);Other  symptoms and signs involving the nervous system (R29.898);Cognitive communication deficit (R41.841);Hemiplegia and hemiparesis Hemiplegia - Right/Left: Left Hemiplegia - dominant/non-dominant: Dominant   Activity Tolerance Patient tolerated treatment well   Patient Left with call bell/phone within reach;Other (comment) (seated on [facility] with call bell for toileting)   Nurse Communication Mobility status        Time: 5784-6962 OT Time Calculation (min): 34 min  Charges: OT General Charges $OT Visit: 1 Visit OT Treatments $Self Care/Home Management : 8-22 mins  Arman Filter., MPH, MS, OTR/L ascom [phone] 02/24/23, 1:24 PM

## 2023-02-24 NOTE — Progress Notes (Signed)
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 Few scattered old small vessel infarctions  in both hemispheres affecting the white matter.    PMH significant for morbid obesity, type 2 diabetes, chronic HFpEF,  hypertension and OSA on CPAP    PT Comments  Pt seen with OT to maximize safety and function, pt denied pain. On 4L via [place] throughout, spO2 reading best reading on L ear with dynamap attachment. The pt demonstrated progress towards goals. minA to come up into sitting EOB, reliance on bed rails and HOB elevated. Sit <> stand several times, initially modAx2 progressed to minAx2. Cued for hand placement to maximize safety. He was able to ambulate in two bouts with CGA and very close chair follow. ~73ft first attempt, and second attempt ~71ft. Noted to be SOB but spO2 on 4L >90%. He was able to also step pivot to [facility], left with call bell and CNA notified of pt status in room. The patient would benefit from further skilled PT intervention to continue to progress towards goals.    If plan is discharge home, recommend the following: Two people to help with walking and/or transfers;Two people to help with bathing/dressing/bathroom;Assistance with cooking/housework;Assist for transportation;Help with stairs or ramp for entrance   Can travel by private vehicle     No  Equipment Recommendations  Other (comment) (TBD)    Recommendations for Other Services       Precautions / Restrictions Precautions Precautions: Fall Precaution Comments: watch  O2 Restrictions Weight Bearing Restrictions Per Provider Order: No     Mobility  Bed Mobility Overal bed mobility: Needs Assistance Bed Mobility: Supine to Sit     Supine to sit: Min assist, Used rails, HOB elevated Sit to supine: +2 for physical assistance, Min assist        Transfers Overall transfer level: Needs assistance Equipment used: Rolling walker (2 wheels) Transfers: Sit to/from Stand Sit to Stand: Min assist, Mod assist, +2 physical assistance           General transfer comment: MIN-MOD A +2 for initial STS from EOB, improving to MIN A +2 from recliner    Ambulation/Gait Ambulation/Gait assistance: Contact guard assist, +2 safety/equipment Gait Distance (Feet):  (69ft, seated rest break, additional 49ft) Assistive device: Rolling walker (2 wheels)         General Gait Details: fatigued and SOB but motivated, CGA for ambulation and very close chair follow   Stairs             Wheelchair Mobility     Tilt Bed    Modified Rankin (Stroke Patients Only)       Balance Overall balance assessment: Needs assistance Sitting-balance support: No upper extremity supported, Feet supported Sitting balance-Leahy Scale: Good     Standing balance support: Bilateral upper extremity supported, Reliant on assistive device for balance Standing balance-Leahy Scale: Fair                              Cognition Arousal: Alert Behavior During Therapy: WFL for tasks assessed/performed Overall Cognitive Status: Within  Functional Limits for tasks assessed                                          Exercises Other Exercises Other Exercises: able to step pivot to [facility] at end of session, CGA. nurse tech notified of pt status in room    General Comments General comments (skin integrity, edema, etc.): SpO2 on 4-6L with mobility, remaining above 92% improving to >98% with rest and VC for PLB      Pertinent Vitals/Pain Pain  Assessment Pain Assessment: No/denies pain    Home Living                          Prior Function            PT Goals (current goals can now be found in the care plan section) Progress towards PT goals: Progressing toward goals    Frequency    Min 1X/week      PT Plan      Co-evaluation PT/OT/SLP Co-Evaluation/Treatment: Yes Reason for Co-Treatment: For patient/therapist safety;To address functional/ADL transfers PT goals addressed during session: Mobility/safety with mobility;Proper use of DME;Balance OT goals addressed during session: ADL's and self-care;Proper use of Adaptive equipment and DME      AM-PAC PT "6 Clicks" Mobility   Outcome Measure  Help needed turning from your back to your side while in a flat bed without using bedrails?: A Lot Help needed moving from lying on your back to sitting on the side of a flat bed without using bedrails?: A Lot Help needed moving to and from a bed to a chair (including a wheelchair)?: A Lot Help needed standing up from a chair using your arms (e.g., wheelchair or bedside chair)?: A Lot Help needed to walk in hospital room?: A Little Help needed climbing 3-5 steps with a railing? : A Lot 6 Click Score: 13    End of Session Equipment Utilized During Treatment: Oxygen Activity Tolerance: Patient tolerated treatment well Patient left: Other (comment) (seated on BSC with call bell, CNA called and aware of pt position) Nurse Communication: Mobility status PT Visit Diagnosis: Muscle weakness (generalized) (M62.81);Difficulty in walking, not elsewhere classified (R26.2);Unsteadiness on feet (R26.81);Hemiplegia and hemiparesis;Other symptoms and signs involving the nervous system (R29.898) Hemiplegia - Right/Left: Left Hemiplegia - dominant/non-dominant: Dominant Hemiplegia - caused by: Cerebral infarction     Time: 2725-3664 PT Time Calculation (min) (ACUTE ONLY): 35 min  Charges:    $Therapeutic Activity: 8-22  mins PT General Charges $$ ACUTE PT VISIT: 1 Visit                     Olga Coaster PT, DPT 1:59 PM,02/24/23

## 2023-02-24 NOTE — TOC Progression Note (Signed)
Transition of Care [facility]) - Progression Note    Patient Details  Name: Pheng Prokop MRN: [id number] Date of Birth: [birth year]  Transition of Care [facility]) CM/SW Contact  Garret Reddish, RN Phone Number: 02/24/2023, 10:57 AM  Clinical Narrative:    Chart reviewed.  Noted that patient continues to have not bed offers.  I have spoken with patient about a tentative bed offer in [place], [place].  Patient is not will to leave the [place] area at this time.  Will continue in efforts find a SNF rehab bed.    TOC will continue to follow for discharge planning.     Expected Discharge Plan: Skilled Nursing Facility Barriers to Discharge: Continued Medical Work up  Expected Discharge Plan and Services     Post Acute Care Choice: Skilled Nursing Facility Living arrangements for the past 2 months: Apartment                                       Social Determinants of Health (SDOH) Interventions SDOH Screenings   Food Insecurity: No Food Insecurity (02/15/2023)  Housing: Low Risk  (02/17/2023)  Transportation Needs: No Transportation Needs (02/15/2023)  Utilities: Not At Risk (02/15/2023)  Tobacco Use: Low Risk  (02/14/2023)    Readmission Risk Interventions     No data to display

## 2023-02-24 NOTE — Progress Notes (Signed)
Progress Note   Patient: Raymond [name] ZOX:[id number] DOB: 04-26-1965 DOA: 02/14/2023     10 DOS: the patient was seen and examined on 02/24/2023   Brief hospital course: HPI: 58 y.o. male with medical history significant of morbid obesity, type 2 diabetes, chronic HFpEF,  hypertension and OSA on CPAP came to ED with complaint of left-sided weakness and dysarthria.   Patient went to bed around 10 PM and was at baseline, this morning when he woke up he was having left-sided weakness and difficulty speaking so EMS was called.   Patient was also having some shortness of breath and confusion.  He was hypoxic at 75% with EMS.  Brought in as code stroke.  Teleneurology was consulted.  He was out of window for thrombolytics.   At baseline patient morbidly obese and does not move around a lot.  Per family he was using CPAP at night.   Patient denies any recent illnesses, chest pain.  He does have some orthopnea and exertional dyspnea.  Some worsening lower extremity edema.  No recent change in appetite or bowel habits.  No urinary symptoms.  Significant Events: Admitted 02/14/2023 for acute CVA MRI showing acute cortical and subcortical infarction of the right posterior frontal lobe petechial blood products present in the region without focal hematoma.  Old right cerebral infarction, old left parietal cortical and subcortical infarction. On trilogy with elevated pCO2  TOC expanding search for rehab beds.  1/19.  Foley catheter removed 1/20.  Creatinine up at 1.67.  Will give fluid bolus.  Started low-dose Semglee insulin 1/21.  Creatinine 1.45.  With sugars elevated will increase Semglee insulin 25 units daily. 1/22: Creatinine 1.57, CBG remained elevated, increased Semglee to 20 units twice daily and added 6 units of NovoLog with meal.  Also restarting home torsemide due to worsening lower extremity edema. 1/23: Slowly improving creatinine, CBG remained elevated, increasing Semglee to  25 units twice daily, still awaiting disposition  Assessment and Plan: * Acute ischemic stroke (HCC) On admission, Presented with left-sided weakness and some dysarthria when he woke up this morning, LKW around 10 PM last night, out of window for thrombolytic.  Initial CT head was negative for any acute abnormality, CTA negative for LVO.  Aspirin and Plavix as DAPT, Continue home Lipitor 02-15-2023 MRI brain confirms Acute cortical and subcortical infarction in the right posterior frontal lobe. Petechial blood products present in the region without focal hematoma. Pt started on DAPT.  Aspirin for 21 days total with Plavix then Plavix monotherapy after that. Physical therapy recommending rehab.  Today walked 4 feet with help of 2 people. Patient able to move his left arm and leg better than when he came in.  Pharyngeal dysphagia Advance to solid food on 1/18  Dysarthria due to acute cerebrovascular accident (CVA) (HCC) Advance to solid food on 1/18.  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  Acute on chronic respiratory failure with hypoxia and hypercapnia (HCC) - suppose to have home Trilogy machine since March 2015 On admission, Found to have hypoxia and hypercarbia.  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  Patient was using CPAP at home, not sure about the compliance.  He was placed on BiPAP in ED. chest x-ray with concern of pulmonary vascular congestion. uses 2 L of oxygen at baseline -Use BiPAP at night and while taking a nap. -Continue with supplemental oxygen-keep the saturation above 90% -Incentive spirometry 02-17-2023  continue with home trilogy at night. Now that his family has brought it to hospital.  AKI (acute kidney injury) (HCC) Creatinine 2.01 on presentation.  Creatinine 1.24 on 1/18.  Creatinine with some fluctuation, currently at 1.57 -Monitor renal function -Avoid nephrotoxins  Acute on  chronic heart failure with preserved ejection fraction (HFpEF) (HCC) EF of 55%, patient on Coreg 25 mg twice a day, Imdur 60 mg daily. -Restarting home torsemide  Essential hypertension Continue Coreg and Imdur Adding home torsemide  Type 2 diabetes mellitus (HCC) Hemoglobin A1c 6.7.  CBG remained  elevated. -Increase Semglee to 25 units twice daily-patient takes 60 units at home -Continue 6 units with meal -Continue with SSI  OSA treated with Trilogy machine Continue trilogy machine  Morbid obesity with BMI of 50.0-59.9, adult (HCC) BMI is 57.66.  Iron deficiency anemia On multivitamin with iron.  Received IV iron on 1/20.  Last hemoglobin 9.3.   Subjective: Patient with no new complaints or deficit.  Physical Exam: Vitals:   02/23/23 2351 02/24/23 0453 02/24/23 0800 02/24/23 1528  BP: (!) 120/47 (!) 125/54 (!) 159/66 (!) 141/60  Pulse: (!) 57 61 65 70  Resp: 16 18 20 18   Temp: 98.2 F (36.8 C) 99.1 F (37.3 C) 98.1 F (36.7 C) 98.9 F (37.2 C)  TempSrc:   Oral   SpO2: 92% 90% 98% 90%  Weight:      Height:       General.  Notably obese gentleman, in no acute distress. Pulmonary.  Lungs clear bilaterally, normal respiratory effort. CV.  Regular rate and rhythm, no JVD, rub or murmur. Abdomen.  Soft, nontender, nondistended, BS positive. CNS.  Alert and oriented .  No new focal neurologic deficit. Extremities.  No edema, no cyanosis, pulses intact and symmetrical.    Data Reviewed: Prior data reviewed  Family Communication:   Disposition: Status is: Inpatient Remains inpatient appropriate because: TOC expanding bed search  Planned Discharge Destination: Rehab  DVT prophylaxis.  Subcu heparin Time spent: 39 minutes  This record has been created using Conservation officer, historic buildings. Errors have been sought and corrected,but may not always be located. Such creation errors do not reflect on the standard of care.   Author: Arnetha Courser, MD 02/24/2023 5:01  PM  For on call review [email or web address].

## 2023-02-24 NOTE — Plan of Care (Signed)

## 2023-02-24 NOTE — Progress Notes (Signed)
Mobility Specialist - Progress Note   02/24/23 1603  Mobility  Activity Stood at bedside;Transferred from chair to bed  Level of Assistance Minimal assist, patient does 75% or more  Assistive Device Front wheel walker  Distance Ambulated (ft) 4 ft  Activity Response Tolerated well  Mobility visit 1 Mobility  Mobility Specialist Start Time (ACUTE ONLY) 1426  Mobility Specialist Stop Time (ACUTE ONLY) 1437  Mobility Specialist Time Calculation (min) (ACUTE ONLY) 11 min   Pt sitting in the recliner upon entry, utilizing [place]. Pt STS to RW initially MaxA +2, progressing to ModA +2. Pt transfer to bed via SPT MinA, left in fowler position with needs within reach.  Zetta Bills Mobility Specialist 02/24/23 4:07 PM

## 2023-02-25 DIAGNOSIS — I69952 Hemiplegia and hemiparesis following unspecified cerebrovascular disease affecting left dominant side: Secondary | ICD-10-CM | POA: Diagnosis not present

## 2023-02-25 DIAGNOSIS — I639 Cerebral infarction, unspecified: Secondary | ICD-10-CM | POA: Diagnosis not present

## 2023-02-25 DIAGNOSIS — J9621 Acute and chronic respiratory failure with hypoxia: Secondary | ICD-10-CM | POA: Diagnosis not present

## 2023-02-25 DIAGNOSIS — R1313 Dysphagia, pharyngeal phase: Secondary | ICD-10-CM | POA: Diagnosis not present

## 2023-02-25 LAB — GLUCOSE, CAPILLARY
Glucose-Capillary: 225 mg/dL — ABNORMAL HIGH (ref 70–99)
Glucose-Capillary: 272 mg/dL — ABNORMAL HIGH (ref 70–99)
Glucose-Capillary: 335 mg/dL — ABNORMAL HIGH (ref 70–99)

## 2023-02-25 MED ORDER — INSULIN GLARGINE-YFGN 100 UNIT/ML ~~LOC~~ SOLN
35.0000 [IU] | Freq: Two times a day (BID) | SUBCUTANEOUS | Status: DC
  Administered 2023-02-25 – 2023-02-27 (×4): 35 [IU] via SUBCUTANEOUS
  Filled 2023-02-25 (×5): qty 0.35

## 2023-02-25 MED ORDER — INSULIN ASPART 100 UNIT/ML IJ SOLN
8.0000 [IU] | Freq: Three times a day (TID) | INTRAMUSCULAR | Status: DC
Start: 2023-02-26 — End: 2023-02-27
  Administered 2023-02-26 – 2023-02-27 (×4): 8 [IU] via SUBCUTANEOUS
  Filled 2023-02-25 (×5): qty 1

## 2023-02-25 NOTE — Plan of Care (Signed)
  Problem: Education: Goal: Ability to describe self-care measures that may prevent or decrease complications (Diabetes Survival Skills Education) will improve Outcome: Progressing Goal: Individualized Educational Video(s) Outcome: Progressing   Problem: Coping: Goal: Ability to adjust to condition or change in health will improve Outcome: Progressing   Problem: Fluid Volume: Goal: Ability to maintain a balanced intake and output will improve Outcome: Progressing   Problem: Health Behavior/Discharge Planning: Goal: Ability to identify and utilize available resources and services will improve Outcome: Progressing Goal: Ability to manage health-related needs will improve Outcome: Progressing   Problem: Metabolic: Goal: Ability to maintain appropriate glucose levels will improve Outcome: Progressing   Problem: Nutritional: Goal: Maintenance of adequate nutrition will improve Outcome: Progressing Goal: Progress toward achieving an optimal weight will improve Outcome: Progressing   Problem: Skin Integrity: Goal: Risk for impaired skin integrity will decrease Outcome: Progressing   Problem: Tissue Perfusion: Goal: Adequacy of tissue perfusion will improve Outcome: Progressing   Problem: Education: Goal: Knowledge of disease or condition will improve Outcome: Progressing Goal: Knowledge of secondary prevention will improve (MUST DOCUMENT ALL) Outcome: Progressing Goal: Knowledge of patient specific risk factors will improve Raymond [name] N/A or DELETE if not current risk factor) Outcome: Progressing   Problem: Ischemic Stroke/TIA Tissue Perfusion: Goal: Complications of ischemic stroke/TIA will be minimized Outcome: Progressing   Problem: Coping: Goal: Will verbalize positive feelings about self Outcome: Progressing Goal: Will identify appropriate support needs Outcome: Progressing   Problem: Health Behavior/Discharge Planning: Goal: Ability to manage health-related needs  will improve Outcome: Progressing Goal: Goals will be collaboratively established with patient/family Outcome: Progressing   Problem: Self-Care: Goal: Ability to participate in self-care as condition permits will improve Outcome: Progressing Goal: Verbalization of feelings and concerns over difficulty with self-care will improve Outcome: Progressing Goal: Ability to communicate needs accurately will improve Outcome: Progressing   Problem: Nutrition: Goal: Risk of aspiration will decrease Outcome: Progressing Goal: Dietary intake will improve Outcome: Progressing   Problem: Education: Goal: Knowledge of General Education information will improve Description: Including pain rating scale, medication(s)/side effects and non-pharmacologic comfort measures Outcome: Progressing   Problem: Health Behavior/Discharge Planning: Goal: Ability to manage health-related needs will improve Outcome: Progressing   Problem: Clinical Measurements: Goal: Ability to maintain clinical measurements within normal limits will improve Outcome: Progressing Goal: Will remain free from infection Outcome: Progressing Goal: Diagnostic test results will improve Outcome: Progressing Goal: Respiratory complications will improve Outcome: Progressing Goal: Cardiovascular complication will be avoided Outcome: Progressing   Problem: Activity: Goal: Risk for activity intolerance will decrease Outcome: Progressing   Problem: Nutrition: Goal: Adequate nutrition will be maintained Outcome: Progressing   Problem: Coping: Goal: Level of anxiety will decrease Outcome: Progressing   Problem: Elimination: Goal: Will not experience complications related to bowel motility Outcome: Progressing Goal: Will not experience complications related to urinary retention Outcome: Progressing   Problem: Pain Management: Goal: General experience of comfort will improve Outcome: Progressing   Problem: Safety: Goal:  Ability to remain free from injury will improve Outcome: Progressing   Problem: Skin Integrity: Goal: Risk for impaired skin integrity will decrease Outcome: Progressing

## 2023-02-25 NOTE — Progress Notes (Signed)
Progress Note   Patient: Raymond [name] YQM:[id number] DOB: 02-09-1965 DOA: 02/14/2023     11 DOS: the patient was seen and examined on 02/25/2023   Brief hospital course: HPI: 58 y.o. male with medical history significant of morbid obesity, type 2 diabetes, chronic HFpEF,  hypertension and OSA on CPAP came to ED with complaint of left-sided weakness and dysarthria.   Patient went to bed around 10 PM and was at baseline, this morning when he woke up he was having left-sided weakness and difficulty speaking so EMS was called.   Patient was also having some shortness of breath and confusion.  He was hypoxic at 75% with EMS.  Brought in as code stroke.  Teleneurology was consulted.  He was out of window for thrombolytics.   At baseline patient morbidly obese and does not move around a lot.  Per family he was using CPAP at night.   Patient denies any recent illnesses, chest pain.  He does have some orthopnea and exertional dyspnea.  Some worsening lower extremity edema.  No recent change in appetite or bowel habits.  No urinary symptoms.  Significant Events: Admitted 02/14/2023 for acute CVA MRI showing acute cortical and subcortical infarction of the right posterior frontal lobe petechial blood products present in the region without focal hematoma.  Old right cerebral infarction, old left parietal cortical and subcortical infarction. On trilogy with elevated pCO2  TOC expanding search for rehab beds.  1/19.  Foley catheter removed 1/20.  Creatinine up at 1.67.  Will give fluid bolus.  Started low-dose Semglee insulin 1/21.  Creatinine 1.45.  With sugars elevated will increase Semglee insulin 25 units daily. 1/22: Creatinine 1.57, CBG remained elevated, increased Semglee to 20 units twice daily and added 6 units of NovoLog with meal.  Also restarting home torsemide due to worsening lower extremity edema. 1/23: Slowly improving creatinine, CBG remained elevated, increasing Semglee to  25 units twice daily, still awaiting disposition 1/24: Semglee was increased to 30 units twice daily 1/25: CBG remained elevated so increasing Semglee to 35 units twice daily and mealtime coverage to 8 units.  Difficult disposition as patient does not want to go out of [place] and none of the building to facilities are able to accept him.  Discussed with patient and his girlfriend who lives with him and they both agree to go home with home health.  Home health services ordered and likely be discharged tomorrow.  Assessment and Plan: * Acute ischemic stroke (HCC) On admission, Presented with left-sided weakness and some dysarthria when he woke up this morning, LKW around 10 PM last night, out of window for thrombolytic.  Initial CT head was negative for any acute abnormality, CTA negative for LVO.  Aspirin and Plavix as DAPT, Continue home Lipitor 02-15-2023 MRI brain confirms Acute cortical and subcortical infarction in the right posterior frontal lobe. Petechial blood products present in the region without focal hematoma. Pt started on DAPT.  Aspirin for 21 days total with Plavix then Plavix monotherapy after that. Physical therapy recommending rehab.  As patient does not want to go out of [place], now agreed to go home with home health Slowly improving left-sided weakness  Pharyngeal dysphagia Advance to solid food on 1/18  Dysarthria due to acute cerebrovascular accident (CVA) (HCC) Advance to solid food on 1/18.  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  Acute on chronic respiratory failure with hypoxia and hypercapnia (HCC) - suppose to  have home Trilogy machine since March 2015 On admission, Found to have hypoxia and hypercarbia.  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  Patient was using CPAP at home, not sure about the compliance.  He was placed on BiPAP in ED. chest x-ray with concern of pulmonary  vascular congestion. uses 2 L of oxygen at baseline -Use BiPAP at night and while taking a nap. -Continue with supplemental oxygen-keep the saturation above 90% -Incentive spirometry 02-17-2023 continue with home trilogy at night. Now that his family has brought it to hospital.  AKI (acute kidney injury) (HCC) Creatinine 2.01 on presentation.  Creatinine 1.24 on 1/18.  Creatinine with some fluctuation, currently at 1.57 -Monitor renal function -Avoid nephrotoxins  Acute on chronic heart failure with preserved ejection fraction (HFpEF) (HCC) EF of 55%, patient on Coreg 25 mg twice a day, Imdur 60 mg daily. -Restarting home torsemide-dose was increased to 40 mg daily  Essential hypertension Continue Coreg and Imdur Adding home torsemide  Type 2 diabetes mellitus (HCC) Hemoglobin A1c 6.7.  CBG remained  elevated. -Increase Semglee to 35 units twice daily-patient takes 60 units at home -Increase mealtime coverage to 8 units -Continue with SSI  OSA treated with Trilogy machine Continue trilogy machine  Morbid obesity with BMI of 50.0-59.9, adult (HCC) BMI is 57.66.  Iron deficiency anemia On multivitamin with iron.  Received IV iron on 1/20.  Last hemoglobin 9.3.   Subjective: Patient was seen and examined today.  No new concern.  We discussed about his disposition as he does not want to go out of [place].  Patient now would like to go home with home health as he is refusing current bed offers.  Physical Exam: Vitals:   02/25/23 0432 02/25/23 0500 02/25/23 0909 02/25/23 1632  BP: (!) 145/53  (!) 140/53 (!) 163/55  Pulse: 64  68 73  Resp: 18  18 16   Temp: (!) 97.4 F (36.3 C)  98.8 F (37.1 C) 98.7 F (37.1 C)  TempSrc:      SpO2: 100%  99% (!) 84%  Weight:  (!) 196.6 kg    Height:       General.  Morbidly obese gentleman, in no acute distress. Pulmonary.  Lungs clear bilaterally, normal respiratory effort. CV.  Regular rate and rhythm, no JVD, rub or murmur. Abdomen.   Soft, nontender, nondistended, BS positive. CNS.  Alert and oriented .  No new focal neurologic deficit. Extremities.  No edema, no cyanosis, pulses intact and symmetrical.    Data Reviewed: Prior data reviewed  Family Communication: Discussed with girlfriend who lives with him on phone.  She agrees for home health.  Disposition: Status is: Inpatient Remains inpatient appropriate because: TOC expanding bed search  Planned Discharge Destination: Rehab  DVT prophylaxis.  Subcu heparin Time spent: 40 minutes  This record has been created using Conservation officer, historic buildings. Errors have been sought and corrected,but may not always be located. Such creation errors do not reflect on the standard of care.   Author: Arnetha Courser, MD 02/25/2023 5:19 PM  For on call review [email or web address].

## 2023-02-26 DIAGNOSIS — R4701 Aphasia: Secondary | ICD-10-CM | POA: Diagnosis not present

## 2023-02-26 DIAGNOSIS — R531 Weakness: Secondary | ICD-10-CM | POA: Diagnosis not present

## 2023-02-26 DIAGNOSIS — J9601 Acute respiratory failure with hypoxia: Secondary | ICD-10-CM | POA: Diagnosis not present

## 2023-02-26 DIAGNOSIS — I639 Cerebral infarction, unspecified: Secondary | ICD-10-CM | POA: Diagnosis not present

## 2023-02-26 LAB — CBC
HCT: 35 % — ABNORMAL LOW (ref 39.0–52.0)
Hemoglobin: 9.9 g/dL — ABNORMAL LOW (ref 13.0–17.0)
MCH: 21.6 pg — ABNORMAL LOW (ref 26.0–34.0)
MCHC: 28.3 g/dL — ABNORMAL LOW (ref 30.0–36.0)
MCV: 76.3 fL — ABNORMAL LOW (ref 80.0–100.0)
Platelets: 323 10*3/uL (ref 150–400)
RBC: 4.59 MIL/uL (ref 4.22–5.81)
RDW: 18.2 % — ABNORMAL HIGH (ref 11.5–15.5)
WBC: 9 10*3/uL (ref 4.0–10.5)
nRBC: 0.3 % — ABNORMAL HIGH (ref 0.0–0.2)

## 2023-02-26 LAB — GLUCOSE, CAPILLARY
Glucose-Capillary: 173 mg/dL — ABNORMAL HIGH (ref 70–99)
Glucose-Capillary: 221 mg/dL — ABNORMAL HIGH (ref 70–99)
Glucose-Capillary: 243 mg/dL — ABNORMAL HIGH (ref 70–99)
Glucose-Capillary: 237 mg/dL — ABNORMAL HIGH (ref 70–99)

## 2023-02-26 LAB — BASIC METABOLIC PANEL
Anion gap: 9 (ref 5–15)
BUN: 42 mg/dL — ABNORMAL HIGH (ref 6–20)
CO2: 30 mmol/L (ref 22–32)
Calcium: 8.4 mg/dL — ABNORMAL LOW (ref 8.9–10.3)
Chloride: 99 mmol/L (ref 98–111)
Creatinine, Ser: 1.3 mg/dL — ABNORMAL HIGH (ref 0.61–1.24)
GFR, Estimated: >60 mL/min (ref >60)
Glucose, Bld: 179 mg/dL — ABNORMAL HIGH (ref 70–99)
Potassium: 4 mmol/L (ref 3.5–5.1)
Sodium: 138 mmol/L (ref 135–145)

## 2023-02-26 MED ORDER — CARVEDILOL 25 MG PO TABS: 25.0000 mg | ORAL_TABLET | Freq: Two times a day (BID) | ORAL | 1 refills | Status: DC

## 2023-02-26 MED ORDER — ISOSORBIDE MONONITRATE ER 60 MG PO TB24: 60.0000 mg | ORAL_TABLET | Freq: Every day | ORAL | 1 refills | Status: DC

## 2023-02-26 MED ORDER — TAMSULOSIN HCL 0.4 MG PO CAPS: 0.4000 mg | ORAL_CAPSULE | Freq: Every day | ORAL | 2 refills | Status: DC

## 2023-02-26 MED ORDER — CLOPIDOGREL BISULFATE 75 MG PO TABS: 75.0000 mg | ORAL_TABLET | Freq: Every day | ORAL | 0 refills | Status: AC

## 2023-02-26 MED ORDER — POLYETHYLENE GLYCOL 3350 17 G PO PACK: 17.0000 g | PACK | Freq: Every day | ORAL | 0 refills | Status: DC | PRN

## 2023-02-26 MED ORDER — FE FUM-VIT C-VIT B12-FA 460-60-0.01-1 MG PO CAPS: 1.0000 | ORAL_CAPSULE | Freq: Two times a day (BID) | ORAL | 2 refills | Status: DC

## 2023-02-26 NOTE — Discharge Summary (Signed)
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) (HCC)   Pharyngeal dysphagia   Acute on chronic respiratory failure with hypoxia and hypercapnia (HCC) - suppose to have home Trilogy machine since March 2015   AKI (acute kidney injury) (HCC)   Acute on chronic heart failure with preserved ejection fraction (HFpEF) (HCC)   Essential hypertension   Type 2 diabetes mellitus (HCC)   OSA treated with Trilogy machine   Morbid obesity with BMI of 50.0-59.9, adult (HCC)   Uncontrolled type 2 diabetes mellitus with hyperglycemia, without long-term current use of insulin (HCC)   Pressure injury of skin   Iron deficiency anemia   Acute respiratory failure with hypoxia and hypercapnia (HCC)   Left-sided weakness   Expressive aphasia  Resolved Problems:   * No resolved hospital problems. *  Hospital Course: HPI: 58 y.o. male with medical history significant of morbid obesity, type 2 diabetes, chronic HFpEF,  hypertension and OSA on CPAP came to ED with complaint of left-sided weakness and dysarthria.   Patient went to bed around 10 PM and was at baseline, this morning when he woke up he was having left-sided weakness and difficulty speaking so EMS was called.   Patient was also having some shortness of breath and confusion.  He was hypoxic at 75% with EMS.  Brought in as code stroke.  Teleneurology was consulted.  He was out of window for thrombolytics.   At baseline patient morbidly obese and  does not move around a lot.  Per family he was using CPAP at night.   Patient denies any recent illnesses, chest pain.  He does have some orthopnea and exertional dyspnea.  Some worsening lower extremity edema.  No recent change in appetite or bowel habits.  No urinary symptoms.  Significant Events: Admitted 02/14/2023 for acute CVA MRI showing acute cortical and subcortical infarction of the right posterior frontal lobe petechial blood products present in the region without focal hematoma.  Old right cerebral infarction, old left parietal cortical and subcortical infarction. On trilogy with elevated pCO2  TOC expanding search for rehab beds.  1/19.  Foley catheter removed 1/20.  Creatinine up at 1.67.  Will give fluid bolus.  Started low-dose Semglee insulin 1/21.  Creatinine 1.45.  With sugars elevated will increase Semglee insulin 25 units daily. 1/22: Creatinine 1.57, CBG remained elevated, increased Semglee to 20 units twice daily and added 6 units of NovoLog with meal.  Also restarting home torsemide due to worsening lower extremity edema. 1/23: Slowly improving creatinine, CBG remained elevated, increasing Semglee to 25 units twice daily, still awaiting disposition 1/24: Semglee was increased to 30 units twice daily 1/25: CBG remained elevated so increasing Semglee to 35 units twice daily and mealtime coverage to 8 units.  Difficult disposition as patient does not want to go out of [place] and none of the building to facilities are able to accept him.  Discussed with patient and his girlfriend who lives with him and they both agree to go home with home health.  Home  health services ordered and likely be discharged tomorrow.  1/26: Patient remained hemodynamically stable and is being discharged home according to his wishes with home health services.  Patient will continue on aspirin and Plavix for 3 weeks followed by aspirin only.  He will continue on current medications and need to  have a close follow-up with his primary care provider and neurologist for further management.  Assessment and Plan: * Acute ischemic stroke (HCC) On admission, Presented with left-sided weakness and some dysarthria when he woke up this morning, LKW around 10 PM last night, out of window for thrombolytic.  Initial CT head was negative for any acute abnormality, CTA negative for LVO.  Aspirin and Plavix as DAPT, Continue home Lipitor 02-15-2023 MRI brain confirms Acute cortical and subcortical infarction in the right posterior frontal lobe. Petechial blood products present in the region without focal hematoma. Pt started on DAPT.  Aspirin for 21 days total with Plavix then Plavix monotherapy after that. Physical therapy recommending rehab.  As patient does not want to go out of [place], now agreed to go home with home health Slowly improving left-sided weakness  Pharyngeal dysphagia Advance to solid food on 1/18  Dysarthria due to acute cerebrovascular accident (CVA) (HCC) Advance to solid food on 1/18.  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  Acute on chronic respiratory failure with hypoxia and hypercapnia (HCC) - suppose to have home Trilogy machine since March 2015 On admission, Found to have hypoxia and hypercarbia.  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  Patient was using CPAP at home, not sure about the compliance.  He was placed on BiPAP in ED. chest x-ray with concern of pulmonary vascular congestion. uses 2 L of oxygen at baseline -Use BiPAP at night and while taking a nap. -Continue with supplemental oxygen-keep the saturation above 90% -Incentive spirometry 02-17-2023 continue with home trilogy at night. Now that his family has brought it to hospital.  AKI (acute kidney injury) (HCC) Creatinine 2.01 on presentation.  Creatinine 1.24 on 1/18.  Creatinine with some fluctuation, currently at  1.30 -Monitor renal function -Avoid nephrotoxins  Acute on chronic heart failure with preserved ejection fraction (HFpEF) (HCC) EF of 55%, patient on Coreg 25 mg twice a day, Imdur 60 mg daily. -Restarting home torsemide-dose was increased to 40 mg daily  Essential hypertension Continue Coreg and Imdur Adding home torsemide  Type 2 diabetes mellitus (HCC) Hemoglobin A1c 6.7.  CBG remained  elevated. -Increase Semglee to 35 units twice daily-patient takes 60 units at home -Increase mealtime coverage to 8 units -Continue with SSI  OSA treated with Trilogy machine Continue trilogy machine  Morbid obesity with BMI of 50.0-59.9, adult (HCC) BMI is 57.66.  Iron deficiency anemia On multivitamin with iron.  Received IV iron on 1/20.  Last hemoglobin 9.9.  Consultants: Neurology Procedures performed: None Disposition: Home health Diet recommendation:  Discharge Diet Orders (From admission, onward)     Start     Ordered   02/26/23 0000  Diet - low sodium heart healthy        02/26/23 1049           Cardiac and Carb modified diet DISCHARGE MEDICATION: Allergies as of 02/26/2023   No Known Allergies      Medication List     STOP taking these medications    insulin lispro 100 UNIT/ML cartridge Commonly known as: HUMALOG   liraglutide 18 MG/3ML  Sopn Commonly known as: VICTOZA   omega-3 acid ethyl esters 1 g capsule Commonly known as: LOVAZA       TAKE these medications    aspirin EC 81 MG tablet Take 81 mg by mouth daily.   atorvastatin 40 MG tablet Commonly known as: LIPITOR atorvastatin 40 mg tablet   Byetta 10 MCG Pen 10 MCG/0.04ML Sopn injection Generic drug: exenatide Inject 10 mcg into the skin 2 (two) times daily with a meal.   carvedilol 25 MG tablet Commonly known as: COREG Take 1 tablet (25 mg total) by mouth 2 (two) times daily with a meal. What changed:  medication strength how much to take when to take this Another medication with  the same name was removed. Continue taking this medication, and follow the directions you see here.   Centrum Silver 50+Men Tabs Take 1 tablet by mouth daily.   clopidogrel 75 MG tablet Commonly known as: PLAVIX Take 1 tablet (75 mg total) by mouth daily for 21 days. Start taking on: February 27, 2023   empagliflozin 25 MG Tabs tablet Commonly known as: JARDIANCE Take by mouth daily. Once daily in the am   Fe Fum-Vit C-Vit B12-FA Caps capsule Commonly known as: TRIGELS-F FORTE Take 1 capsule by mouth 2 (two) times daily.   gabapentin 300 MG capsule Commonly known as: NEURONTIN Take 300 mg by mouth 3 (three) times daily.   insulin aspart 100 UNIT/ML injection Commonly known as: novoLOG Inject into the skin 3 (three) times daily before meals.   isosorbide mononitrate 60 MG 24 hr tablet Commonly known as: IMDUR Take 1 tablet (60 mg total) by mouth at bedtime.   Lantus 100 UNIT/ML injection Generic drug: insulin glargine Inject 90 Units into the skin 2 (two) times daily.   metFORMIN 1000 MG tablet Commonly known as: GLUCOPHAGE Take 1,000 mg by mouth 2 (two) times daily with a meal.   OXYGEN Inhale 2 L into the lungs daily.   pantoprazole 40 MG tablet Commonly known as: PROTONIX Take 40 mg by mouth daily.   polyethylene glycol 17 g packet Commonly known as: MIRALAX / GLYCOLAX Take 17 g by mouth daily as needed for mild constipation.   tamsulosin 0.4 MG Caps capsule Commonly known as: FLOMAX Take 1 capsule (0.4 mg total) by mouth daily after supper.   torsemide 20 MG tablet Commonly known as: DEMADEX Take 2 tablets (40 mg total) by mouth daily.   TRUEplus Insulin Syringe 29G X 1/2" 1 ML Misc Generic drug: INSULIN SYRINGE 1CC/29G   TRUEplus Pen Needles 31G X 8 MM Misc Generic drug: Insulin Pen Needle   valsartan 160 MG tablet Commonly known as: DIOVAN Take 160 mg by mouth daily.               Durable Medical Equipment  (From admission, onward)            Start     Ordered   02/26/23 1049  For home use only DME oxygen  Once       Question Answer Comment  Length of Need Lifetime   Mode or (Route) Nasal cannula   Liters per Minute 4   Frequency Continuous (stationary and portable oxygen unit needed)   Oxygen conserving device Yes   Oxygen delivery system Gas      02/26/23 1049   02/26/23 1049  For home use only DME Walker rolling  Once       Comments: Need bariatric walker  Question Answer Comment  Walker: With 5 Inch Wheels   Patient needs a walker to treat with the following condition Stroke (HCC)      02/26/23 1049   02/26/23 1049  For home use only DME Bedside commode  Once       Question:  Patient needs a bedside commode to treat with the following condition  Answer:  Stroke [facility])   02/26/23 1049            Follow-up Information     Martie Round, NP Follow up.   Specialty: Nurse Practitioner Why: Hospital follow up Contact information: [address] [place] [address] [phone]         Lonell Face, MD. Schedule an appointment as soon as possible for a visit in 1 week(s).   Specialty: Neurology Contact information: [phone] [facility] [address] [facility] [place] [address] [phone]                Discharge Exam: Filed Weights   02/23/23 0353 02/25/23 0500 02/26/23 0500  Weight: (!) 192.4 kg (!) 196.6 kg (!) 196.3 kg   General.  Morbidly obese gentleman, in no acute distress. Pulmonary.  Lungs clear bilaterally, normal respiratory effort. CV.  Regular rate and rhythm, no JVD, rub or murmur. Abdomen.  Soft, nontender, nondistended, BS positive. CNS.  Alert and oriented .  No focal neurologic deficit. Extremities.  Trace LE edema, no cyanosis, pulses intact and symmetrical.  Condition at discharge: stable  The results of significant diagnostics from this hospitalization (including imaging, microbiology, ancillary and laboratory) are listed below for  reference.   Imaging Studies: ECHOCARDIOGRAM COMPLETE Result Date: 02/15/2023    ECHOCARDIOGRAM REPORT   Patient Name:   DURANTE VIOLETT [facility] Date of Exam: 02/15/2023 Medical Rec #:  [id number]                   Height:       74.0 in Accession #:    [id number]                  Weight:       447.4 lb Date of Birth:  08-May-1965                   BSA:          3.060 m Patient Age:    57 years                    BP:           131/56 mmHg Patient Gender: M                           HR:           74 bpm. Exam Location:  [facility] Procedure: 2D Echo, Cardiac Doppler, Color Doppler and Intracardiac            Opacification Agent Indications:     Stroke  History:         Patient has prior history of Echocardiogram examinations, most                  recent 09/10/2022. CHF, Stroke and COPD; Risk                  Factors:Hypertension, Sleep Apnea, Diabetes and Dyslipidemia.  Sonographer:     Mikki Harbor Referring Phys:  [id number] Fairy Ashlock Diagnosing Phys: Windell Norfolk  Sonographer Comments: Technically  challenging study due to limited acoustic windows and patient is obese. Image acquisition challenging due to COPD. IMPRESSIONS  1. Technically difficult study. Definity used to optimize the study.  2. Left ventricular ejection fraction, by estimation, is 55 to 60%. The left ventricle has normal function. The left ventricle has no regional wall motion abnormalities. There is moderate concentric left ventricular hypertrophy. Left ventricular diastolic parameters are indeterminate.  3. Right ventricular systolic function was not well visualized. The right ventricular size is not well visualized. There is normal pulmonary artery systolic pressure.  4. The mitral valve is normal in structure. Trivial mitral valve regurgitation.  5. The aortic valve is tricuspid. Aortic valve regurgitation is not visualized. FINDINGS  Left Ventricle: Left ventricular ejection fraction, by estimation, is 55 to 60%. The left ventricle has  normal function. The left ventricle has no regional wall motion abnormalities. Definity contrast agent was given IV to delineate the left ventricular  endocardial borders. The left ventricular internal cavity size was normal in size. There is moderate concentric left ventricular hypertrophy. Left ventricular diastolic parameters are indeterminate. Right Ventricle: The right ventricular size is not well visualized. Right vetricular wall thickness was not well visualized. Right ventricular systolic function was not well visualized. There is normal pulmonary artery systolic pressure. The tricuspid regurgitant velocity is 1.91 m/s, and with an assumed right atrial pressure of 8 mmHg, the estimated right ventricular systolic pressure is 22.6 mmHg. Left Atrium: Left atrial size was not well visualized. Right Atrium: Right atrial size was not well visualized. Pericardium: There is no evidence of pericardial effusion. Mitral Valve: The mitral valve is normal in structure. Trivial mitral valve regurgitation. MV peak gradient, 4.5 mmHg. The mean mitral valve gradient is 2.0 mmHg. Tricuspid Valve: The tricuspid valve is normal in structure. Tricuspid valve regurgitation is trivial. Aortic Valve: The aortic valve is tricuspid. Aortic valve regurgitation is not visualized. Aortic valve mean gradient measures 3.0 mmHg. Aortic valve peak gradient measures 7.8 mmHg. Aortic valve area, by VTI measures 3.28 cm. Pulmonic Valve: The pulmonic valve was not well visualized. Pulmonic valve regurgitation is not visualized. Aorta: The aortic root is normal in size and structure. Venous: The inferior vena cava was not well visualized. IAS/Shunts: The interatrial septum was not well visualized.  LEFT VENTRICLE PLAX 2D LVIDd:         5.90 cm LVIDs:         4.30 cm LV PW:         1.70 cm LV IVS:        1.50 cm LVOT diam:     2.10 cm LV SV:         72 LV SV Index:   24 LVOT Area:     3.46 cm  LEFT ATRIUM         Index LA diam:    4.90 cm 1.60  cm/m  AORTIC VALVE                    PULMONIC VALVE AV Area (Vmax):    2.57 cm     PV Vmax:       1.30 m/s AV Area (Vmean):   3.05 cm     PV Peak grad:  6.8 mmHg AV Area (VTI):     3.28 cm AV Vmax:           140.00 cm/s AV Vmean:          79.900 cm/s AV VTI:  0.221 m AV Peak Grad:      7.8 mmHg AV Mean Grad:      3.0 mmHg LVOT Vmax:         104.00 cm/s LVOT Vmean:        70.300 cm/s LVOT VTI:          0.209 m LVOT/AV VTI ratio: 0.95  AORTA Ao Root diam: 3.70 cm MITRAL VALVE               TRICUSPID VALVE MV Area (PHT): 4.10 cm    TR Peak grad:   14.6 mmHg MV Area VTI:   3.16 cm    TR Vmax:        191.00 cm/s MV Peak grad:  4.5 mmHg MV Mean grad:  2.0 mmHg    SHUNTS MV Vmax:       1.06 m/s    Systemic VTI:  0.21 m MV Vmean:      57.9 cm/s   Systemic Diam: 2.10 cm MV Decel Time: 185 msec MV E velocity: 87.00 cm/s Windell Norfolk Electronically signed by Windell Norfolk Signature Date/Time: 02/15/2023/5:23:45 PM    Final    MR BRAIN WO CONTRAST Result Date: 02/14/2023 CLINICAL DATA:  Neuro deficit, acute, stroke suspected. EXAM: MRI HEAD WITHOUT CONTRAST TECHNIQUE: Multiplanar, multiecho pulse sequences of the brain and surrounding structures were obtained without intravenous contrast. COMPARISON:  CT studies earlier same day FINDINGS: Brain: Diffusion imaging shows acute cortical and subcortical infarction in the right posterior frontal lobe. Petechial blood products present in the region without focal hematoma. No other acute insult. Otherwise, there are old cerebellar infarctions on the right. No focal brainstem insult. 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 Few scattered old small vessel infarctions in both hemispheres affecting the white matter. Few other small foci of hemosiderin deposition related to old infarctions. No mass, hydrocephalus or extra-axial collection. Vascular: Major vessels at the base of the brain show flow. Skull and upper cervical spine: Negative Sinuses/Orbits:  Mild mucosal inflammatory changes of the paranasal sinuses. No advanced finding. Orbits negative. Other: None IMPRESSION: 1. Acute cortical and subcortical infarction in the right posterior frontal lobe. Petechial blood products present in the region without focal hematoma. 2. 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 Few scattered old small vessel infarctions in both hemispheres affecting the white matter. Electronically Signed   By: Paulina Fusi M.D.   On: 02/14/2023 14:42   DG Chest Port 1 View Result Date: 02/14/2023 CLINICAL DATA:  Shortness of breath. EXAM: PORTABLE CHEST 1 VIEW COMPARISON:  Chest radiograph dated September 09, 2022. FINDINGS: Low lung volumes. The heart is mildly enlarged, unchanged. Bilateral interstitial prominence. No focal consolidation, sizeable pleural effusion, or pneumothorax identified. No acute osseous abnormality. IMPRESSION: Low lung volumes. Cardiomegaly with findings suggestive of pulmonary vascular congestion. Electronically Signed   By: Hart Robinsons M.D.   On: 02/14/2023 08:48   CT Angio Head Neck W WO CM (CODE STROKE) Result Date: 02/14/2023 CLINICAL DATA:  Last known well 2200 hours EXAM: CT ANGIOGRAPHY HEAD AND NECK CT PERFUSION BRAIN TECHNIQUE: Multidetector CT imaging of the head and neck was performed using the standard protocol during bolus administration of intravenous contrast. Multiplanar CT image reconstructions and MIPs were obtained to evaluate the vascular anatomy. Carotid stenosis measurements (when applicable) are obtained utilizing NASCET criteria, using the distal internal carotid diameter as the denominator. Multiphase CT imaging of the brain was performed following IV bolus contrast injection. Subsequent parametric perfusion  maps were calculated using RAPID software. RADIATION DOSE REDUCTION: This exam was performed according to the departmental dose-optimization program which includes automated exposure  control, adjustment of the mA and/or kV according to patient size and/or use of iterative reconstruction technique. CONTRAST:  75mL OMNIPAQUE IOHEXOL 350 MG/ML SOLN; 40mL OMNIPAQUE IOHEXOL 350 MG/ML SOLN COMPARISON:  Head CT from earlier today FINDINGS: CTA NECK FINDINGS Aortic arch: Suboptimal bolus density, no significant finding. Right carotid system: Partial retropharyngeal course. No stenosis or luminal irregularity. Left carotid system: Partial retropharyngeal course. Mild calcified plaque at the proximal ICA. No stenosis or luminal irregularity. Vertebral arteries: Soft tissue attenuation and suboptimal bolus density affects detailed visualization of the vertebral arteries, especially proximally. Both vertebral arteries are symmetrically enhancing at the skull base. No proximal subclavian stenosis seen. Skeleton: No acute finding Other neck: No acute finding Upper chest: Clear apical lungs Review of the MIP images confirms the above findings CTA HEAD FINDINGS Anterior circulation: No significant stenosis, proximal occlusion, aneurysm, or vascular malformation. Posterior circulation: Hypoplastic P1 segments, especially on the left. No major branch occlusion, beading, or aneurysm. Venous sinuses: Unremarkable Anatomic variants: As above Review of the MIP images confirms the above findings CT perfusion: Motion artifact, possibly exacerbated by ACA arterial input selection, causes noncontributory highlighting of most of the brain on the time to peak maps. IMPRESSION: CTA: 1. No emergent finding. 2. Minimal calcified atherosclerosis. 3. Suboptimal bolus density and soft tissue attenuation limits visualization of arteries in the neck, especially the proximal vertebral arteries. CT perfusion: Noncontributory due to artifact. Electronically Signed   By: Tiburcio Pea M.D.   On: 02/14/2023 07:24   CT CEREBRAL PERFUSION W CONTRAST Result Date: 02/14/2023 CLINICAL DATA:  Last known well 2200 hours EXAM: CT  ANGIOGRAPHY HEAD AND NECK CT PERFUSION BRAIN TECHNIQUE: Multidetector CT imaging of the head and neck was performed using the standard protocol during bolus administration of intravenous contrast. Multiplanar CT image reconstructions and MIPs were obtained to evaluate the vascular anatomy. Carotid stenosis measurements (when applicable) are obtained utilizing NASCET criteria, using the distal internal carotid diameter as the denominator. Multiphase CT imaging of the brain was performed following IV bolus contrast injection. Subsequent parametric perfusion maps were calculated using RAPID software. RADIATION DOSE REDUCTION: This exam was performed according to the departmental dose-optimization program which includes automated exposure control, adjustment of the mA and/or kV according to patient size and/or use of iterative reconstruction technique. CONTRAST:  75mL OMNIPAQUE IOHEXOL 350 MG/ML SOLN; 40mL OMNIPAQUE IOHEXOL 350 MG/ML SOLN COMPARISON:  Head CT from earlier today FINDINGS: CTA NECK FINDINGS Aortic arch: Suboptimal bolus density, no significant finding. Right carotid system: Partial retropharyngeal course. No stenosis or luminal irregularity. Left carotid system: Partial retropharyngeal course. Mild calcified plaque at the proximal ICA. No stenosis or luminal irregularity. Vertebral arteries: Soft tissue attenuation and suboptimal bolus density affects detailed visualization of the vertebral arteries, especially proximally. Both vertebral arteries are symmetrically enhancing at the skull base. No proximal subclavian stenosis seen. Skeleton: No acute finding Other neck: No acute finding Upper chest: Clear apical lungs Review of the MIP images confirms the above findings CTA HEAD FINDINGS Anterior circulation: No significant stenosis, proximal occlusion, aneurysm, or vascular malformation. Posterior circulation: Hypoplastic P1 segments, especially on the left. No major branch occlusion, beading, or aneurysm.  Venous sinuses: Unremarkable Anatomic variants: As above Review of the MIP images confirms the above findings CT perfusion: Motion artifact, possibly exacerbated by ACA arterial input selection, causes noncontributory highlighting of most of the  brain on the time to peak maps. IMPRESSION: CTA: 1. No emergent finding. 2. Minimal calcified atherosclerosis. 3. Suboptimal bolus density and soft tissue attenuation limits visualization of arteries in the neck, especially the proximal vertebral arteries. CT perfusion: Noncontributory due to artifact. Electronically Signed   By: Tiburcio Pea M.D.   On: 02/14/2023 07:24   CT HEAD CODE STROKE WO CONTRAST Result Date: 02/14/2023 CLINICAL DATA:  Code stroke.  Acute stroke suspected EXAM: CT HEAD WITHOUT CONTRAST TECHNIQUE: Contiguous axial images were obtained from the base of the skull through the vertex without intravenous contrast. RADIATION DOSE REDUCTION: This exam was performed according to the departmental dose-optimization program which includes automated exposure control, adjustment of the mA and/or kV according to patient size and/or use of iterative reconstruction technique. COMPARISON:  Brain MRI 10/20/2011 FINDINGS: Brain: No evidence of acute infarction, hemorrhage, hydrocephalus, extra-axial collection or mass lesion/mass effect. Small chronic infarcts clustered in the right cerebellum. Small moderate chronic left occipital parietal cortex infarct. Small chronic appearing right posterior frontal cortex infarct. These infarcts pleural accounted for on prior brain MRI. Vascular: No hyperdense vessel or unexpected calcification. Skull: Normal. Negative for fracture or focal lesion. Posterior scalp scarring and reticulation. Sinuses/Orbits: No acute finding ASPECTS [facility] Stroke Program Early CT Score) Not scored without localizing symptom Prelim sent in epic chat. IMPRESSION: Chronic infarcts that are stable from 2013 brain MRI. No hemorrhage or visible  acute infarct. Electronically Signed   By: Tiburcio Pea M.D.   On: 02/14/2023 07:07    Microbiology: Results for orders placed or performed during the hospital encounter of 01/03/22  Aerobic Culture w Gram Stain (superficial specimen)     Status: None   Collection Time: 01/03/22 10:45 AM   Specimen: Wound  Result Value Ref Range Status   Specimen Description   Final    WOUND Performed at [facility], [address], [place], [place] [address]    Special Requests   Final    LEFT METARSAL HEAD Performed at [facility], [address] [place], [place] [address]    Gram Stain NO WBC SEEN NO ORGANISMS SEEN   Final   Culture   Final    RARE PSEUDOMONAS AERUGINOSA FEW GROUP B STREP(S.AGALACTIAE)ISOLATED TESTING AGAINST S. AGALACTIAE NOT ROUTINELY PERFORMED DUE TO PREDICTABILITY OF AMP/PEN/VAN SUSCEPTIBILITY. Performed at [facility], [address], [place], [place] [address]    Report Status 01/06/2022 FINAL  Final   Organism ID, Bacteria PSEUDOMONAS AERUGINOSA  Final      Susceptibility   Pseudomonas aeruginosa - MIC*    CEFTAZIDIME 4 SENSITIVE Sensitive     CIPROFLOXACIN <=0.25 SENSITIVE Sensitive     GENTAMICIN <=1 SENSITIVE Sensitive     IMIPENEM 2 SENSITIVE Sensitive     PIP/TAZO 8 SENSITIVE Sensitive     CEFEPIME 2 SENSITIVE Sensitive     * RARE PSEUDOMONAS AERUGINOSA    Labs: CBC: Recent Labs  Lab 02/20/23 0621 02/21/23 0531 02/22/23 0611 02/23/23 0440 02/26/23 0521  WBC 9.4  --   --  13.9* 9.0  HGB 9.2* 9.3* 9.6* 9.4* 9.9*  HCT 32.6*  --   --  32.8* 35.0*  MCV 76.0*  --   --  75.2* 76.3*  PLT 273  --   --  330 323   Basic Metabolic Panel: Recent Labs  Lab 02/20/23 0621 02/21/23 0531 02/22/23 0611 02/23/23 0440 02/26/23 0521  NA 138 135 135 138 138  K 4.5 4.5 4.9 4.7 4.0  CL 100 98 97* 99 99  CO2 29 29 29 28 30   GLUCOSE 296* 343* 379* 255* 179*  BUN 50* 48* 48* 45* 42*  CREATININE 1.67* 1.45* 1.57* 1.53* 1.30*   CALCIUM 8.2* 8.0* 8.4* 8.6* 8.4*   Liver Function Tests: No results for input(s): "AST", "ALT", "ALKPHOS", "BILITOT", "PROT", "ALBUMIN" in the last 168 hours. CBG: Recent Labs  Lab 02/24/23 2035 02/25/23 1332 02/25/23 1622 02/25/23 2141 02/26/23 0832  GLUCAP 231* 225* 272* 335* 173*    Discharge time spent: greater than 30 minutes.  This record has been created using Conservation officer, historic buildings. Errors have been sought and corrected,but may not always be located. Such creation errors do not reflect on the standard of care.   Signed: Arnetha Courser, MD [facility] 02/26/2023

## 2023-02-26 NOTE — Progress Notes (Signed)
    Durable Medical Equipment  (From admission, onward)           Start     Ordered   02/26/23 1157  For home use only DME Bedside commode  Once       Comments: Bariatric  Question:  Patient needs a bedside commode to treat with the following condition  Answer:  Stroke [facility])   02/26/23 1156   02/26/23 1049  For home use only DME oxygen  Once       Question Answer Comment  Length of Need Lifetime   Mode or (Route) Nasal cannula   Liters per Minute 4   Frequency Continuous (stationary and portable oxygen unit needed)   Oxygen conserving device Yes   Oxygen delivery system Gas      02/26/23 1049   02/26/23 1049  For home use only DME Walker rolling  Once       Comments: Need bariatric walker  Question Answer Comment  Walker: With 5 Inch Wheels   Patient needs a walker to treat with the following condition Stroke (HCC)      02/26/23 1049             Gabriel Cirri MSN RN CM  RN Case Manager [place]  Transitions of Care Direct Dial: [phone] (Weekends Only) [facility] Main Office Phone: [phone] [facility] Fax: [phone] [email or web address]

## 2023-02-26 NOTE — TOC Transition Note (Signed)
Transition of Care [facility]) - Discharge Note   Patient Details  Name: Raymond [name] MRN: [id number] Date of Birth: November 15, 1965  Transition of Care [facility]) CM/SW Contact:  Bing Quarry, RN Phone Number: 02/26/2023, 12:03 PM   Clinical Narrative:  1/26: Patient discharging to home with [facility] via [facility] for PT/OT/RN/SLP confirmed with Elnita Maxwell. DME bariatric RW and BSC via [employer] confirmed by Ada. DME home oxygen via [employer]  per Onalee Hua (has trilogy) for 4L/[place] continuous and portable for lifetime. [facility] to transport to home and if not [facility] will be arranged. Confirmed HH with GF as unable to speak to patient.    Gabriel Cirri MSN RN CM  RN Case Manager [place]  Transitions of Care Direct Dial: [phone] (Weekends Only) [facility] Main Office Phone: [phone] [facility] Fax: [phone] [email or web address]     Final next level of care: Home w Home Health Services Barriers to Discharge: Barriers Resolved   Patient Goals and CMS Choice            Discharge Placement                       Discharge Plan and Services Additional resources added to the After Visit Summary for       Post Acute Care Choice: Skilled Nursing Facility          DME Arranged: 3-N-1, Oxygen, Walker rolling DME Agency: [facility] Date DME Agency Contacted: 02/26/23 [facility] as well.) Time DME Agency Contacted: [phone] Representative spoke with at DME Agency: Ada at [employer] and Onalee Hua and main intake number for [employer] HH Arranged: RN, PT, OT, Speech Therapy HH Agency: [facility] Date [facility] Agency Contacted: 02/26/23 Time HH Agency Contacted: [phone] Representative spoke with at [facility] Agency: Elnita Maxwell.  Social Drivers of Health (SDOH) Interventions SDOH Screenings   Food Insecurity: No Food Insecurity (02/15/2023)  Housing: Low Risk  (02/17/2023)  Transportation Needs: No Transportation Needs (02/15/2023)  Utilities: Not At Risk (02/15/2023)  Tobacco Use: Low Risk   (02/14/2023)     Readmission Risk Interventions     No data to display

## 2023-02-26 NOTE — Progress Notes (Signed)
    Durable Medical Equipment  (From admission, onward)           Start     Ordered   02/26/23 1049  For home use only DME oxygen  Once       Question Answer Comment  Length of Need Lifetime   Mode or (Route) Nasal cannula   Liters per Minute 4   Frequency Continuous (stationary and portable oxygen unit needed)   Oxygen conserving device Yes   Oxygen delivery system Gas      02/26/23 1049   02/26/23 1049  For home use only DME Walker rolling  Once       Comments: Need bariatric walker  Question Answer Comment  Walker: With 5 Inch Wheels   Patient needs a walker to treat with the following condition Stroke (HCC)      02/26/23 1049   02/26/23 1049  For home use only DME Bedside commode  Once       Question:  Patient needs a bedside commode to treat with the following condition  Answer:  Stroke [facility])   02/26/23 1049             Gabriel Cirri MSN RN CM  RN Case Manager [place]  Transitions of Care Direct Dial: [phone] (Weekends Only) [facility] Main Office Phone: [phone] [facility] Fax: [phone] .com

## 2023-02-27 DIAGNOSIS — I639 Cerebral infarction, unspecified: Secondary | ICD-10-CM | POA: Diagnosis not present

## 2023-02-27 DIAGNOSIS — R1313 Dysphagia, pharyngeal phase: Secondary | ICD-10-CM | POA: Diagnosis not present

## 2023-02-27 DIAGNOSIS — I69952 Hemiplegia and hemiparesis following unspecified cerebrovascular disease affecting left dominant side: Secondary | ICD-10-CM | POA: Diagnosis not present

## 2023-02-27 DIAGNOSIS — J9621 Acute and chronic respiratory failure with hypoxia: Secondary | ICD-10-CM | POA: Diagnosis not present

## 2023-02-27 LAB — GLUCOSE, CAPILLARY
Glucose-Capillary: 193 mg/dL — ABNORMAL HIGH (ref 70–99)
Glucose-Capillary: 180 mg/dL — ABNORMAL HIGH (ref 70–99)

## 2023-02-27 NOTE — Progress Notes (Signed)
Progress Note   Patient: Raymond [name] RUE:[id number] DOB: 27-Feb-1965 DOA: 02/14/2023     13 DOS: the patient was seen and examined on 02/27/2023   Brief hospital course: HPI: 58 y.o. male with medical history significant of morbid obesity, type 2 diabetes, chronic HFpEF,  hypertension and OSA on CPAP came to ED with complaint of left-sided weakness and dysarthria.   Patient went to bed around 10 PM and was at baseline, this morning when he woke up he was having left-sided weakness and difficulty speaking so EMS was called.   Patient was also having some shortness of breath and confusion.  He was hypoxic at 75% with EMS.  Brought in as code stroke.  Teleneurology was consulted.  He was out of window for thrombolytics.   At baseline patient morbidly obese and does not move around a lot.  Per family he was using CPAP at night.   Patient denies any recent illnesses, chest pain.  He does have some orthopnea and exertional dyspnea.  Some worsening lower extremity edema.  No recent change in appetite or bowel habits.  No urinary symptoms.  Significant Events: Admitted 02/14/2023 for acute CVA MRI showing acute cortical and subcortical infarction of the right posterior frontal lobe petechial blood products present in the region without focal hematoma.  Old right cerebral infarction, old left parietal cortical and subcortical infarction. On trilogy with elevated pCO2  TOC expanding search for rehab beds.  1/19.  Foley catheter removed 1/20.  Creatinine up at 1.67.  Will give fluid bolus.  Started low-dose Semglee insulin 1/21.  Creatinine 1.45.  With sugars elevated will increase Semglee insulin 25 units daily. 1/22: Creatinine 1.57, CBG remained elevated, increased Semglee to 20 units twice daily and added 6 units of NovoLog with meal.  Also restarting home torsemide due to worsening lower extremity edema. 1/23: Slowly improving creatinine, CBG remained elevated, increasing Semglee to  25 units twice daily, still awaiting disposition 1/24: Semglee was increased to 30 units twice daily 1/25: CBG remained elevated so increasing Semglee to 35 units twice daily and mealtime coverage to 8 units.  Difficult disposition as patient does not want to go out of [place] and none of the building to facilities are able to accept him.  Discussed with patient and his girlfriend who lives with him and they both agree to go home with home health.  Home health services ordered and likely be discharged tomorrow.  1/26: Patient remained hemodynamically stable and is being discharged home according to his wishes with home health services.  Patient will continue on aspirin and Plavix for 3 weeks followed by aspirin only.  1/27: Patient was discharged yesterday with home health, unable to leave as oxygen delivery got delayed due to weekend.  He remained stable for discharge and will go once oxygen is delivered.  Assessment and Plan: * Acute ischemic stroke (HCC) On admission, Presented with left-sided weakness and some dysarthria when he woke up this morning, LKW around 10 PM last night, out of window for thrombolytic.  Initial CT head was negative for any acute abnormality, CTA negative for LVO.  Aspirin and Plavix as DAPT, Continue home Lipitor 02-15-2023 MRI brain confirms Acute cortical and subcortical infarction in the right posterior frontal lobe. Petechial blood products present in the region without focal hematoma. Pt started on DAPT.  Aspirin for 21 days total with Plavix then Plavix monotherapy after that. Physical therapy recommending rehab.  As patient does not want to go out of [place],  now agreed to go home with home health Slowly improving left-sided weakness  Pharyngeal dysphagia Advance to solid food on 1/18  Dysarthria due to acute cerebrovascular accident (CVA) (HCC) Advance to solid food on 1/18.  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  Acute on chronic respiratory failure with hypoxia and hypercapnia (HCC) - suppose to have home Trilogy machine since March 2015 On admission, Found to have hypoxia and hypercarbia.  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  Patient was using CPAP at home, not sure about the compliance.  He was placed on BiPAP in ED. chest x-ray with concern of pulmonary vascular congestion. uses 2 L of oxygen at baseline -Use BiPAP at night and while taking a nap. -Continue with supplemental oxygen-keep the saturation above 90% -Incentive spirometry 02-17-2023 continue with home trilogy at night. Now that his family has brought it to hospital.  AKI (acute kidney injury) (HCC) Creatinine 2.01 on presentation.  Creatinine 1.24 on 1/18.  Creatinine with some fluctuation, currently at 1.57 -Monitor renal function -Avoid nephrotoxins  Acute on chronic heart failure with preserved ejection fraction (HFpEF) (HCC) EF of 55%, patient on Coreg 25 mg twice a day, Imdur 60 mg daily. -Restarting home torsemide-dose was increased to 40 mg daily  Essential hypertension Continue Coreg and Imdur Adding home torsemide  Type 2 diabetes mellitus (HCC) Hemoglobin A1c 6.7.  CBG remained  elevated. -Increase Semglee to 35 units twice daily-patient takes 60 units at home -Increase mealtime coverage to 8 units -Continue with SSI  OSA treated with Trilogy machine Continue trilogy machine  Morbid obesity with BMI of 50.0-59.9, adult (HCC) BMI is 57.66.  Iron deficiency anemia On multivitamin with iron.  Received IV iron on 1/20.  Last hemoglobin 9.3.   Subjective: Patient with no new concern.  He was upset that he could not go home yesterday.  Physical Exam: Vitals:   02/26/23 1848 02/26/23 2118 02/27/23 0538 02/27/23 0840  BP: (!) 151/71 (!) 156/73 (!) 150/65 (!) 157/70  Pulse: (!) 56 71 72 73  Resp: 18 18 20 20   Temp: 98.6 F (37 C) 99.8 F (37.7 C) 98.9 F (37.2  C)   TempSrc: Oral Oral Oral   SpO2: 93% (!) 87% 91% 93%  Weight:      Height:       General.  Morbidly obese gentleman, in no acute distress. Pulmonary.  Lungs clear bilaterally, normal respiratory effort. CV.  Regular rate and rhythm, no JVD, rub or murmur. Abdomen.  Soft, nontender, nondistended, BS positive. CNS.  Alert and oriented .  No new focal neurologic deficit. Extremities.  No edema, no cyanosis, pulses intact and symmetrical.    Data Reviewed: Prior data reviewed  Family Communication: Discussed with girlfriend who lives with him on phone.  She agrees for home health.  Disposition: Status is: Inpatient Remains inpatient appropriate because: TOC expanding bed search  Planned Discharge Destination: Rehab  DVT prophylaxis.  Subcu heparin Time spent: 39 minutes  This record has been created using Conservation officer, historic buildings. Errors have been sought and corrected,but may not always be located. Such creation errors do not reflect on the standard of care.   Author: Arnetha Courser, MD 02/27/2023 11:47 AM  For on call review [email or web address].

## 2023-02-27 NOTE — TOC Transition Note (Signed)
Transition of Care [facility]) - Discharge Note   Patient Details  Name: Raymond [name] MRN: [id number] Date of Birth: 1965-06-08  Transition of [facility]) CM/SW Contact:  Garret Reddish, RN Phone Number: 02/27/2023, 12:12 PM   Clinical Narrative:    Chart reviewed.  Noted that patient has orders for today.    I have spoken with Westly Pam from [place].  I have informed Westly Pam that patient will be going home today.  I have made Westly Pam aware that patient has orders for home 02 at 4 per [place].  Westly Pam reports that they will provide a portable tank for discharge home today.  Westly Pam informs me that patient has a refillable concentor at home.  She reports that Christoper Allegra will have to provide patient with portable tanks at home as he has the refillable concentrator at home.  Westly Pam also informs me that patient has NIV via [employer] also.  Westly Pam has requested that I fax updated o2 order to [phone].    Noted that RW and BSC was order via [employer].   I have informed Elnita Maxwell with [facility] that patient will be a discharge home for today.  [facility] will provide home health services for RN, PT, OT, and SLP.    I have spoken to patient's sister Maud Deed and she will transport patient home today.    I have informed staff nurse of the above information.     Final next level of care: Home w Home Health Services Barriers to Discharge: No Barriers Identified   Patient Goals and CMS Choice   CMS Medicare.gov Compare Post Acute Care list provided to:: Patient   Industry ownership interest in [facility].provided to:: Patient    Discharge Placement                  Name of family member notified: Velna Hatchet Patient and family notified of of transfer: 02/27/23  Discharge Plan and Services Additional resources added to the After Visit Summary for       Post Acute Care Choice: Skilled Nursing Facility          DME Arranged: Oxygen (Home 02 arranged via [facility] Allegra) DME Agency: [facility] (BSC/RW  arranged with [employer]) Date DME Agency Contacted: 02/27/23 Time DME Agency Contacted: 1202 Representative spoke with at DME Agency: Westly Pam HH Arranged: RN, PT, OT, Speech Therapy HH Agency: [facility] Date [facility] Agency Contacted: 02/26/23 (Informed Elnita Maxwell with [facility] that patient would be a discharge for today.) Time [facility] Agency Contacted: 1202 Representative spoke with at [facility] Agency: Elnita Maxwell  Social Drivers of Health (SDOH) Interventions SDOH Screenings   Food Insecurity: No Food Insecurity (02/15/2023)  Housing: Low Risk  (02/17/2023)  Transportation Needs: No Transportation Needs (02/15/2023)  Utilities: Not At Risk (02/15/2023)  Tobacco Use: Low Risk  (02/14/2023)     Readmission Risk Interventions     No data to display

## 2023-03-03 ENCOUNTER — Inpatient Hospital Stay
Admission: EM | Admit: 2023-03-03 | Discharge: 2023-03-13 | DRG: 280 | Disposition: A | Discharge status: Home Health | Payer: Medicare HMO | Attending: Internal Medicine | Admitting: Internal Medicine

## 2023-03-03 ENCOUNTER — Emergency Department: Payer: Medicare HMO

## 2023-03-03 ENCOUNTER — Encounter
Admission: EM | Disposition: A | Discharge status: Home Health | Payer: Self-pay | Source: Home / Self Care | Attending: Internal Medicine

## 2023-03-03 ENCOUNTER — Other Ambulatory Visit: Payer: Self-pay

## 2023-03-03 DIAGNOSIS — I130 Hypertensive heart and chronic kidney disease with heart failure and stage 1 through stage 4 chronic kidney disease, or unspecified chronic kidney disease: Secondary | ICD-10-CM | POA: Diagnosis present

## 2023-03-03 DIAGNOSIS — Z8249 Family history of ischemic heart disease and other diseases of the circulatory system: Secondary | ICD-10-CM

## 2023-03-03 DIAGNOSIS — J9621 Acute and chronic respiratory failure with hypoxia: Secondary | ICD-10-CM | POA: Diagnosis present

## 2023-03-03 DIAGNOSIS — I21A1 Myocardial infarction type 2: Secondary | ICD-10-CM | POA: Diagnosis present

## 2023-03-03 DIAGNOSIS — D72829 Elevated white blood cell count, unspecified: Secondary | ICD-10-CM | POA: Diagnosis present

## 2023-03-03 DIAGNOSIS — E1140 Type 2 diabetes mellitus with diabetic neuropathy, unspecified: Secondary | ICD-10-CM | POA: Diagnosis present

## 2023-03-03 DIAGNOSIS — D509 Iron deficiency anemia, unspecified: Secondary | ICD-10-CM | POA: Diagnosis present

## 2023-03-03 DIAGNOSIS — N182 Chronic kidney disease, stage 2 (mild): Secondary | ICD-10-CM | POA: Diagnosis present

## 2023-03-03 DIAGNOSIS — E785 Hyperlipidemia, unspecified: Secondary | ICD-10-CM | POA: Diagnosis present

## 2023-03-03 DIAGNOSIS — Z6841 Body Mass Index (BMI) 40.0 and over, adult: Secondary | ICD-10-CM

## 2023-03-03 DIAGNOSIS — I5032 Chronic diastolic (congestive) heart failure: Secondary | ICD-10-CM | POA: Diagnosis not present

## 2023-03-03 DIAGNOSIS — R570 Cardiogenic shock: Secondary | ICD-10-CM | POA: Diagnosis present

## 2023-03-03 DIAGNOSIS — I5043 Acute on chronic combined systolic (congestive) and diastolic (congestive) heart failure: Secondary | ICD-10-CM | POA: Diagnosis present

## 2023-03-03 DIAGNOSIS — G928 Other toxic encephalopathy: Secondary | ICD-10-CM | POA: Diagnosis present

## 2023-03-03 DIAGNOSIS — Z794 Long term (current) use of insulin: Secondary | ICD-10-CM | POA: Diagnosis not present

## 2023-03-03 DIAGNOSIS — I69354 Hemiplegia and hemiparesis following cerebral infarction affecting left non-dominant side: Secondary | ICD-10-CM | POA: Diagnosis not present

## 2023-03-03 DIAGNOSIS — I214 Non-ST elevation (NSTEMI) myocardial infarction: Secondary | ICD-10-CM | POA: Insufficient documentation

## 2023-03-03 DIAGNOSIS — R4781 Slurred speech: Secondary | ICD-10-CM | POA: Diagnosis present

## 2023-03-03 DIAGNOSIS — I63411 Cerebral infarction due to embolism of right middle cerebral artery: Secondary | ICD-10-CM | POA: Diagnosis not present

## 2023-03-03 DIAGNOSIS — I5021 Acute systolic (congestive) heart failure: Secondary | ICD-10-CM | POA: Diagnosis not present

## 2023-03-03 DIAGNOSIS — Z823 Family history of stroke: Secondary | ICD-10-CM

## 2023-03-03 DIAGNOSIS — Z7984 Long term (current) use of oral hypoglycemic drugs: Secondary | ICD-10-CM

## 2023-03-03 DIAGNOSIS — G8929 Other chronic pain: Secondary | ICD-10-CM | POA: Diagnosis present

## 2023-03-03 DIAGNOSIS — G4733 Obstructive sleep apnea (adult) (pediatric): Secondary | ICD-10-CM | POA: Diagnosis present

## 2023-03-03 DIAGNOSIS — Z7902 Long term (current) use of antithrombotics/antiplatelets: Secondary | ICD-10-CM

## 2023-03-03 DIAGNOSIS — I639 Cerebral infarction, unspecified: Secondary | ICD-10-CM | POA: Diagnosis present

## 2023-03-03 DIAGNOSIS — E1122 Type 2 diabetes mellitus with diabetic chronic kidney disease: Secondary | ICD-10-CM | POA: Diagnosis present

## 2023-03-03 DIAGNOSIS — I959 Hypotension, unspecified: Secondary | ICD-10-CM | POA: Diagnosis not present

## 2023-03-03 DIAGNOSIS — N179 Acute kidney failure, unspecified: Secondary | ICD-10-CM | POA: Diagnosis present

## 2023-03-03 DIAGNOSIS — I213 ST elevation (STEMI) myocardial infarction of unspecified site: Secondary | ICD-10-CM | POA: Diagnosis present

## 2023-03-03 DIAGNOSIS — I2510 Atherosclerotic heart disease of native coronary artery without angina pectoris: Secondary | ICD-10-CM | POA: Diagnosis present

## 2023-03-03 DIAGNOSIS — I10 Essential (primary) hypertension: Secondary | ICD-10-CM | POA: Diagnosis present

## 2023-03-03 DIAGNOSIS — J449 Chronic obstructive pulmonary disease, unspecified: Secondary | ICD-10-CM | POA: Diagnosis present

## 2023-03-03 DIAGNOSIS — J9622 Acute and chronic respiratory failure with hypercapnia: Secondary | ICD-10-CM | POA: Diagnosis present

## 2023-03-03 DIAGNOSIS — Z7401 Bed confinement status: Secondary | ICD-10-CM | POA: Diagnosis not present

## 2023-03-03 DIAGNOSIS — E119 Type 2 diabetes mellitus without complications: Secondary | ICD-10-CM

## 2023-03-03 DIAGNOSIS — Z79899 Other long term (current) drug therapy: Secondary | ICD-10-CM

## 2023-03-03 DIAGNOSIS — Z7982 Long term (current) use of aspirin: Secondary | ICD-10-CM

## 2023-03-03 DIAGNOSIS — Z9981 Dependence on supplemental oxygen: Secondary | ICD-10-CM

## 2023-03-03 DIAGNOSIS — E1151 Type 2 diabetes mellitus with diabetic peripheral angiopathy without gangrene: Secondary | ICD-10-CM | POA: Diagnosis present

## 2023-03-03 DIAGNOSIS — Z833 Family history of diabetes mellitus: Secondary | ICD-10-CM

## 2023-03-03 DIAGNOSIS — Z89421 Acquired absence of other right toe(s): Secondary | ICD-10-CM

## 2023-03-03 HISTORY — PX: CORONARY/GRAFT ACUTE MI REVASCULARIZATION: CATH118305

## 2023-03-03 HISTORY — PX: LEFT HEART CATH AND CORONARY ANGIOGRAPHY: CATH118249

## 2023-03-03 HISTORY — DX: Slurred speech: R47.81

## 2023-03-03 LAB — BLOOD GAS, ARTERIAL
Acid-Base Excess: 0.1 mmol/L (ref 0.0–2.0)
Bicarbonate: 28.5 mmol/L — ABNORMAL HIGH (ref 20.0–28.0)
Delivery systems: POSITIVE
Expiratory PAP: 8 cm[H2O]
FIO2: 40 %
Inspiratory PAP: 16 cm[H2O]
O2 Saturation: 97.9 %
Patient temperature: 37
pCO2 arterial: 62 mm[Hg] — ABNORMAL HIGH (ref 32–48)
pH, Arterial: 7.27 — ABNORMAL LOW (ref 7.35–7.45)
pO2, Arterial: 97 mm[Hg] (ref 83–108)

## 2023-03-03 LAB — COMPREHENSIVE METABOLIC PANEL
ALT: 19 U/L (ref 0–44)
AST: 15 U/L (ref 15–41)
Albumin: 3.2 g/dL — ABNORMAL LOW (ref 3.5–5.0)
Alkaline Phosphatase: 55 U/L (ref 38–126)
Anion gap: 14 (ref 5–15)
BUN: 45 mg/dL — ABNORMAL HIGH (ref 6–20)
CO2: 25 mmol/L (ref 22–32)
Calcium: 8.2 mg/dL — ABNORMAL LOW (ref 8.9–10.3)
Chloride: 96 mmol/L — ABNORMAL LOW (ref 98–111)
Creatinine, Ser: 3.44 mg/dL — ABNORMAL HIGH (ref 0.61–1.24)
GFR, Estimated: 20 mL/min — ABNORMAL LOW (ref >60)
Glucose, Bld: 157 mg/dL — ABNORMAL HIGH (ref 70–99)
Potassium: 4.1 mmol/L (ref 3.5–5.1)
Sodium: 135 mmol/L (ref 135–145)
Total Bilirubin: 0.6 mg/dL (ref 0.0–1.2)
Total Protein: 6.8 g/dL (ref 6.5–8.1)

## 2023-03-03 LAB — RESP PANEL BY RT-PCR (RSV, FLU A&B, COVID)  RVPGX2
Influenza A by PCR: NEGATIVE
Influenza B by PCR: NEGATIVE
Resp Syncytial Virus by PCR: NEGATIVE
SARS Coronavirus 2 by RT PCR: NEGATIVE

## 2023-03-03 LAB — TROPONIN I (HIGH SENSITIVITY)
Troponin I (High Sensitivity): 1575 ng/L (ref <18)
Troponin I (High Sensitivity): 1624 ng/L (ref <18)
Troponin I (High Sensitivity): 1495 ng/L (ref <18)

## 2023-03-03 LAB — CBC WITH DIFFERENTIAL/PLATELET
Abs Immature Granulocytes: 0.07 10*3/uL (ref 0.00–0.07)
Basophils Absolute: 0.1 10*3/uL (ref 0.0–0.1)
Basophils Relative: 0 %
Eosinophils Absolute: 0.5 10*3/uL (ref 0.0–0.5)
Eosinophils Relative: 4 %
HCT: 38.3 % — ABNORMAL LOW (ref 39.0–52.0)
Hemoglobin: 10.6 g/dL — ABNORMAL LOW (ref 13.0–17.0)
Immature Granulocytes: 1 %
Lymphocytes Relative: 15 %
Lymphs Abs: 1.9 10*3/uL (ref 0.7–4.0)
MCH: 21.6 pg — ABNORMAL LOW (ref 26.0–34.0)
MCHC: 27.7 g/dL — ABNORMAL LOW (ref 30.0–36.0)
MCV: 78.2 fL — ABNORMAL LOW (ref 80.0–100.0)
Monocytes Absolute: 0.9 10*3/uL (ref 0.1–1.0)
Monocytes Relative: 7 %
Neutro Abs: 9.6 10*3/uL — ABNORMAL HIGH (ref 1.7–7.7)
Neutrophils Relative %: 73 %
Platelets: 387 10*3/uL (ref 150–400)
RBC: 4.9 MIL/uL (ref 4.22–5.81)
RDW: 19.2 % — ABNORMAL HIGH (ref 11.5–15.5)
WBC: 13.1 10*3/uL — ABNORMAL HIGH (ref 4.0–10.5)
nRBC: 0.2 % (ref 0.0–0.2)

## 2023-03-03 LAB — LACTIC ACID, PLASMA
Lactic Acid, Venous: 2.4 mmol/L (ref 0.5–1.9)
Lactic Acid, Venous: 2 mmol/L (ref 0.5–1.9)

## 2023-03-03 LAB — BRAIN NATRIURETIC PEPTIDE: B Natriuretic Peptide: 106.6 pg/mL — ABNORMAL HIGH (ref 0.0–100.0)

## 2023-03-03 LAB — GLUCOSE, CAPILLARY: Glucose-Capillary: 168 mg/dL — ABNORMAL HIGH (ref 70–99)

## 2023-03-03 SURGERY — CORONARY/GRAFT ACUTE MI REVASCULARIZATION
Anesthesia: Moderate Sedation

## 2023-03-03 MED ORDER — ACETAMINOPHEN 325 MG PO TABS
650.0000 mg | ORAL_TABLET | ORAL | Status: DC | PRN
Start: 2023-03-03 — End: 2023-03-13

## 2023-03-03 MED ORDER — INSULIN ASPART 100 UNIT/ML IJ SOLN: 0.0000 [IU] | Freq: Three times a day (TID) | INTRAMUSCULAR | Status: DC

## 2023-03-03 MED ORDER — IOHEXOL 300 MG/ML  SOLN
INTRAMUSCULAR | Status: DC | PRN
  Administered 2023-03-03: 55 mL

## 2023-03-03 MED ORDER — HEPARIN SODIUM (PORCINE) 1000 UNIT/ML IJ SOLN
INTRAMUSCULAR | Status: DC | PRN
  Administered 2023-03-03: 6000 [IU] via INTRAVENOUS

## 2023-03-03 MED ORDER — SODIUM CHLORIDE 0.9 % IV BOLUS
500.0000 mL | Freq: Once | INTRAVENOUS | Status: AC
  Administered 2023-03-03: 500 mL via INTRAVENOUS

## 2023-03-03 MED ORDER — ONDANSETRON HCL 4 MG/2ML IJ SOLN: 4.0000 mg | Freq: Four times a day (QID) | INTRAMUSCULAR | Status: DC | PRN

## 2023-03-03 MED ORDER — LABETALOL HCL 5 MG/ML IV SOLN: 10.0000 mg | INTRAVENOUS | Status: AC | PRN

## 2023-03-03 MED ORDER — ASPIRIN 81 MG PO CHEW
81.0000 mg | CHEWABLE_TABLET | Freq: Every day | ORAL | Status: DC
Start: 2023-03-04 — End: 2023-03-04

## 2023-03-03 MED ORDER — INSULIN ASPART 100 UNIT/ML IJ SOLN: 0.0000 [IU] | Freq: Every day | INTRAMUSCULAR | Status: DC

## 2023-03-03 MED ORDER — HEPARIN SODIUM (PORCINE) 1000 UNIT/ML IJ SOLN
INTRAMUSCULAR | Status: AC
  Filled 2023-03-03: qty 10

## 2023-03-03 MED ORDER — SODIUM CHLORIDE 0.9 % WEIGHT BASED INFUSION
1.0000 mL/kg/h | INTRAVENOUS | Status: AC
  Administered 2023-03-03 – 2023-03-04 (×3): 1 mL/kg/h via INTRAVENOUS

## 2023-03-03 MED ORDER — OXYCODONE-ACETAMINOPHEN 5-325 MG PO TABS: 1.0000 | ORAL_TABLET | ORAL | Status: DC | PRN

## 2023-03-03 MED ORDER — VERAPAMIL HCL 2.5 MG/ML IV SOLN
INTRAVENOUS | Status: DC | PRN
  Administered 2023-03-03: 2.5 mg via INTRAVENOUS

## 2023-03-03 MED ORDER — SODIUM CHLORIDE 0.9% FLUSH
3.0000 mL | Freq: Two times a day (BID) | INTRAVENOUS | Status: DC
Start: 2023-03-04 — End: 2023-03-04

## 2023-03-03 MED ORDER — SODIUM CHLORIDE 0.9% FLUSH: 3.0000 mL | INTRAVENOUS | Status: DC | PRN

## 2023-03-03 MED ORDER — NOREPINEPHRINE 4 MG/250ML-% IV SOLN
0.0000 ug/min | INTRAVENOUS | Status: DC
  Administered 2023-03-03: 6 ug/min via INTRAVENOUS
  Administered 2023-03-04: 5 ug/min via INTRAVENOUS
  Administered 2023-03-04: 8 ug/min via INTRAVENOUS
  Filled 2023-03-03 (×2): qty 250

## 2023-03-03 MED ORDER — HEPARIN (PORCINE) IN NACL 1000-0.9 UT/500ML-% IV SOLN
INTRAVENOUS | Status: AC
  Filled 2023-03-03: qty 1000

## 2023-03-03 MED ORDER — VERAPAMIL HCL 2.5 MG/ML IV SOLN
INTRAVENOUS | Status: AC
  Filled 2023-03-03: qty 2

## 2023-03-03 MED ORDER — CLOPIDOGREL BISULFATE 75 MG PO TABS
75.0000 mg | ORAL_TABLET | Freq: Every day | ORAL | Status: DC
Start: 2023-03-04 — End: 2023-03-04

## 2023-03-03 MED ORDER — HEPARIN (PORCINE) IN NACL 1000-0.9 UT/500ML-% IV SOLN
INTRAVENOUS | Status: DC | PRN
  Administered 2023-03-03 (×2): 500 mL

## 2023-03-03 MED ORDER — HYDRALAZINE HCL 20 MG/ML IJ SOLN: 10.0000 mg | INTRAMUSCULAR | Status: AC | PRN

## 2023-03-03 MED ORDER — SODIUM CHLORIDE 0.9 % IV SOLN: 250.0000 mL | INTRAVENOUS | Status: DC | PRN

## 2023-03-03 MED ORDER — NOREPINEPHRINE 4 MG/250ML-% IV SOLN
INTRAVENOUS | Status: AC
  Filled 2023-03-03: qty 250

## 2023-03-03 MED ORDER — LIDOCAINE HCL (PF) 1 % IJ SOLN
INTRAMUSCULAR | Status: DC | PRN
  Administered 2023-03-03: 2 mL

## 2023-03-03 SURGICAL SUPPLY — 16 items
CATH INFINITI 5FR ANG PIGTAIL (CATHETERS) IMPLANT
CATH VISTA GUIDE 6FR JR4 (CATHETERS) ×1
CATH VISTA GUIDE 6FR JR4 ECOPK (CATHETERS) IMPLANT
CATH VISTA GUIDE 6FR XB3.5 (CATHETERS) IMPLANT
DEVICE RAD COMP TR BAND LRG (VASCULAR PRODUCTS) IMPLANT
DRAPE BRACHIAL (DRAPES) IMPLANT
GLIDESHEATH SLEND SS 6F .021 (SHEATH) IMPLANT
GUIDEWIRE INQWIRE 1.5J.035X260 (WIRE) IMPLANT
INQWIRE 1.5J .035X260CM (WIRE) ×1 IMPLANT
KIT SYRINGE INJ CVI SPIKEX1 (MISCELLANEOUS) IMPLANT
PACK CARDIAC CATH (CUSTOM PROCEDURE TRAY) ×1 IMPLANT
PANNUS RETENTION SYSTEM 2 PAD (MISCELLANEOUS) IMPLANT
PROTECTION STATION PRESSURIZED (MISCELLANEOUS) ×1 IMPLANT
SET ATX-X65L (MISCELLANEOUS) IMPLANT
STATION PROTECTION PRESSURIZED (MISCELLANEOUS) IMPLANT
TUBING CIL FLEX 10 FLL-RA (TUBING) IMPLANT

## 2023-03-03 NOTE — ED Notes (Signed)
called to [facility] per MD Funke/activate code stemi/rep charlie.

## 2023-03-03 NOTE — ED Provider Notes (Signed)
[facility] Provider Note    Event Date/Time   First MD Initiated Contact with Patient 03/03/23 1907     (approximate)   History   infection   HPI  Raymond [name] is a 58 y.o. male with morbid obese CAD, diabetes, hypertension, OSA on CPAP who comes in with concerns for infection.  I reviewed the admission summary where patient was admitted on 02/14/2023 found to have an acute infarct.   Discussed with the friend who lives with him the friend Raymond [name] he had difficulty with speech 7PM, never went back to normal since then and hasn't urinated since 11AM this morning.  No fevers. He was on the tank of oxygen and she thinks it ran out and that when when he started acting more tired around 5pm. He has been drinking well.   Patient himself denies any chest pain, shortness of breath, abdominal pain.  He states he has some chronic low back pain but denies really anything new.  Denies any testicle pain.     Physical Exam   Triage Vital Signs: ED Triage Vitals  Encounter Vitals Group     BP      Systolic BP Percentile      Diastolic BP Percentile      Pulse      Resp      Temp      Temp src      SpO2      Weight      Height      Head Circumference      Peak Flow      Pain Score      Pain Loc      Pain Education      Exclude from Growth Chart     Most recent vital signs: Vitals:   03/03/23 1910 03/03/23 1914  BP: (!) 97/49   Pulse: 69   Resp: 20   Temp:  98.2 F (36.8 C)  SpO2: 91%      General: Awake, no distress.  CV:  Good peripheral perfusion.  Resp:  Normal effort.  Abd:  No distention.  Soft and nontender Other:  Able To lift both legs up off the bed slightly able to have equal grip strength.  No obvious slurred speech at this time   ED Results / Procedures / Treatments   Labs (all labs ordered are listed, but only abnormal results are displayed) Labs Reviewed  CBC WITH DIFFERENTIAL/PLATELET -  Abnormal; Notable for the following components:      Result Value   WBC 13.1 (*)    Hemoglobin 10.6 (*)    HCT 38.3 (*)    MCV 78.2 (*)    MCH 21.6 (*)    MCHC 27.7 (*)    RDW 19.2 (*)    Neutro Abs 9.6 (*)    All other components within normal limits  CULTURE, BLOOD (ROUTINE X 2)  CULTURE, BLOOD (ROUTINE X 2)  RESP PANEL BY RT-PCR (RSV, FLU A&B, COVID)  [id number]  COMPREHENSIVE METABOLIC PANEL  LACTIC ACID, PLASMA  LACTIC ACID, PLASMA  TROPONIN I (HIGH SENSITIVITY)     EKG  My interpretation of EKG:  Sinus rate of 66 with very minimal ST elevation in inferior leads with T wave inversions in V2 and V3, reviewed prior EKG T wave version V3 do not believe this represents a STEMI given the story   RADIOLOGY I have reviewed the CT head personally interpreted no evidence of intracranial  hemorrhage  PROCEDURES:  Critical Care performed: No  .1-3 Lead EKG Interpretation  Performed by: Concha Se, MD Authorized by: Concha Se, MD     Interpretation: normal     ECG rate:  60   ECG rate assessment: normal     Rhythm: sinus rhythm     Ectopy: none     Conduction: normal   .Critical Care  Performed by: Concha Se, MD Authorized by: Concha Se, MD   Critical care provider statement:    Critical care time (minutes):  30   Critical care was necessary to treat or prevent imminent or life-threatening deterioration of the following conditions:  Cardiac failure   Critical care was time spent personally by me on the following activities:  Development of treatment plan with patient or surrogate, discussions with consultants, evaluation of patient's response to treatment, examination of patient, ordering and review of laboratory studies, ordering and review of radiographic studies, ordering and performing treatments and interventions, pulse oximetry, re-evaluation of patient's condition and review of old charts    MEDICATIONS ORDERED IN ED: Medications  sodium chloride  0.9 % bolus 500 mL (has no administration in time range)     IMPRESSION / MDM / ASSESSMENT AND PLAN / ED COURSE  I reviewed the triage vital signs and the nursing notes.   Patient's presentation is most consistent with acute presentation with potential threat to life or bodily function.   Patient comes in with concerns for slurred speech, hypotension.  Patient out of the window for stroke code given last known normal was 7 PM on 03/03/2023.  No evidence of LVO based upon examination.  Given the decrease in urine output I am concerned about potentially kidney dysfunction.  Will get CT head given the slurred speech to evaluate for hemorrhagic bleed, CT renal to evaluate for any obstruction, kidney stone.  Patient was initially hypoxic with EMS but the friend is now reporting that the oxygen tank was not working and so I suspect that this was just related to that.  Patient was placed back onto his baseline 4 L.  He was slightly hypotensive so I will give 500 cc of fluid.  His EKG was reading read as a potential STEMI but patient has no chest pain or SOB  Will get troponins, repeat EKG and continue to closely monitor.  I read discussed with patient to ensure that he is not had any chest pain or shortness of breath.  The only reason he came in was for decreased urine output and the slurred speech over 24 hours ago.  Initial EKG was being read as STEMI but his story was not consistent with STEMI however his troponin resulted at greater than thousand.  Got a repeat EKG that looks similar therefore discussed the case with Dr. Darrold Junker given concern  . Agree that is a very atypical case and wants to get a second troponin.  I reevaluated patient he continues deny any chest pain.  9:00 PM CR 3.44 TROP 1575  K 4.1   Seen to call from cardiology who did want me to activate the STEMI Cath Lab.  He still does not feel that the story is very consistent but after a discussion with the patient they have elected to  take patient to the Cath Lab.  I will discuss with the hospital team for admission for his renal failure.  A Foley was placed due to some urinary retention.  The patient is on the cardiac  monitor to evaluate for evidence of arrhythmia and/or significant heart rate changes.      FINAL CLINICAL IMPRESSION(S) / ED DIAGNOSES   Final diagnoses:  AKI (acute kidney injury) (HCC)  ST elevation myocardial infarction (STEMI), unspecified artery (HCC)     Rx / DC Orders   ED Discharge Orders     None        Note:  This document was prepared using Dragon voice recognition software and may include unintentional dictation errors.   Concha Se, MD 03/03/23 2127

## 2023-03-03 NOTE — Progress Notes (Signed)
eLink Physician-Brief Progress Note Patient Name: Raymond [name] DOB: [birth year] MRN: [id number]   Date of Service  03/03/2023  HPI/Events of Note  Patient admitted with suspected infero-lateral STEMI, cardiac catheterization did not show actionable disease, he also has acute respiratory failure requiring BIPAP. Patient is NOT a PCCM patient.   eICU Interventions  New Patient Evaluation.        Migdalia Dk 03/03/2023, 11:27 PM

## 2023-03-03 NOTE — H&P (Signed)
NAME:  Raymond [name], MRN:  [id number], DOB:  1965-08-26, LOS: 0 ADMISSION DATE:  03/03/2023, CONSULTATION DATE:  03/03/23 REFERRING MD:  Shirlean Kelly  CHIEF COMPLAINT:  slurred speech , hypotension    HPI  58 y.o African American Male with significant PMH of morbid obesity, chronic  HFpEF, HTN, OSA on CPAP, CAD, T2DM, and recent CVA on 02/14/2023 who presented to the ED with chief complaints of possible infectious process.  On review of chart, patient was recently hospitalized on 02/14/2023 with CVA deemed not a candidate for thrombolytics due to being outside of the window period. He was treated with DuoNeb and discharged on 1/27.  He was sent to the ED today by family who noted he had not been eating, drinking, or urinating well. He reported that he ran out of his home oxygen.  He was also noted with slurred speech and hypotension, per family LKNW was 7 PM.  On EMS arrival patient was hypotensive so was given 500 cc bolus.  Initial EKG was concerning for potential STEMI however patient had no chest pain or shortness of breath   ED Course: Initial vital signs showed HR of 69 beats/minute, BP 97/31mm Hg, the RR 20 breaths/minute, and the oxygen saturation 91% on 4L [place] and a temperature of 98.60F (36.8C).  Ischial EKG in the ED was concerning for inferior STEMI.  Due to significantly elevated troponin and abnormal EKG, code STEMI was activated and patient taken to Cath Lab for emergent cath.  Pertinent Labs/Diagnostics Findings: Na+/ K+: 135/4.1 Glucose: 157 BUN/Cr.:45/3.44  WBC: 13.1 K/L  PCT: 0.20 Lactic acid: 2.4 COVID PCR: Negative,  troponin: 1575  BNP:106.6   CXR> CTH> CTA Chest> CT Abd/pelvis> Medication administered in the WU:JWJX Disposition:ICU  Past Medical History  morbid obesity, chronic  HFpEF, HTN, OSA on CPAP, CAD, T2DM, and recent CVA on 02/14/2023  Significant Hospital Events   01/31:Admit with STEMI s/p LHC, Acute on Chronic Hypoxic Hypercapnic Respiratory  Failure   Consults:  Cardiology Nephrology  Procedures:  01/31: Left heart cath  Significant Diagnostic Tests:  01/31: Chest Xray> IMPRESSION: Cardiomegaly with pulmonary vascular congestion. 01/31: Noncontrast CT head> IMPRESSION: 1. Evolving right frontal infarct. No evidence of new/interval acute abnormality; however, MRI could provide more sensitive evaluation for new peri-infarct ischemia if clinically warranted. 2. Remote cerebellar and left parietal infarcts. 01/31: CT Renal > IMPRESSION: 1. No acute intra-abdominal pathology identified. No definite radiographic explanation for the patient's reported symptoms. 2. Asymmetric subcutaneous edema and dermal thickening involving the: Left heart cath pannus which may relate to passive a though a superficial inflammatory process such as cellulitis, could appear similarly. 3. Bilateral hip effusions are suspected, well assessed on this examination. 4. Mild visceral arteriosclerosis.  Interim History / Subjective:    -  Micro Data:  01/31: SARS-CoV-2 PCR> negative 01/31: Influenza PCR> negative 01/31: Blood culture x2> 01/31: MRSA PCR>> nonreactive  Antimicrobials:  1/31 Vancomycin>> 1/31 Cefepime>>  OBJECTIVE  Blood pressure (!) 92/56, pulse 62, temperature (!) 96.4 F (35.8 C), temperature source Axillary, resp. rate (!) 22, height 6\' 2"  (1.88 m), weight (!) 187.2 kg, SpO2 100%.    FiO2 (%):  [40 %] 40 %   Intake/Output Summary (Last 24 hours) at 03/03/2023 2311 Last data filed at 03/03/2023 2100 Gross per 24 hour  Intake 500 ml  Output --  Net 500 ml   Filed Weights   03/03/23 1911  Weight: (!) 187.2 kg     Physical Examination  GENERAL:58  year-old morbidly obese critically ill patient lying in the bed altered EYES: PEERLA. No scleral icterus. Extraocular muscles intact.  HEENT: Head atraumatic, normocephalic. Oropharynx and nasopharynx clear.  NECK:  No JVD, supple  LUNGS: Decreased breath sounds  bilaterally.  Mild use of accessory muscles of respiration.  CARDIOVASCULAR: S1, S2 normal. No murmurs, rubs, or gallops.  ABDOMEN: Soft, NTND EXTREMITIES: 2+ pitting edema of bilateral lower extremity. Bilateral toes amputations. Capillary refill >3 seconds in all extremities. Pulses palpable distally. NEUROLOGIC: The patient is altered. No focal neurological deficit appreciated. Cranial nerves are intact.  SKIN: No obvious rash, lesion, or ulcer. Warm to touch Labs/imaging that I havepersonally reviewed  (right click and "Reselect all SmartList Selections" daily)     Labs   CBC: Recent Labs  Lab 02/26/23 0521 03/03/23 1915  WBC 9.0 13.1*  NEUTROABS  --  9.6*  HGB 9.9* 10.6*  HCT 35.0* 38.3*  MCV 76.3* 78.2*  PLT 323 387    Basic Metabolic Panel: Recent Labs  Lab 02/26/23 0521 03/03/23 1915  NA 138 135  K 4.0 4.1  CL 99 96*  CO2 30 25  GLUCOSE 179* 157*  BUN 42* 45*  CREATININE 1.30* 3.44*  CALCIUM 8.4* 8.2*   GFR: Estimated Creatinine Clearance: 40.6 mL/min (A) (by C-G formula based on SCr of 3.44 mg/dL (H)). Recent Labs  Lab 02/26/23 0521 03/03/23 1915  WBC 9.0 13.1*  LATICACIDVEN  --  2.4*    Liver Function Tests: Recent Labs  Lab 03/03/23 1915  AST 15  ALT 19  ALKPHOS 55  BILITOT 0.6  PROT 6.8  ALBUMIN 3.2*   No results for input(s): "LIPASE", "AMYLASE" in the last 168 hours. No results for input(s): "AMMONIA" in the last 168 hours.  ABG    Component Value Date/Time   PHART 7.27 (L) 03/03/2023 2258   PCO2ART 62 (H) 03/03/2023 2258   PO2ART 97 03/03/2023 2258   HCO3 28.5 (H) 03/03/2023 2258   O2SAT 97.9 03/03/2023 2258     Coagulation Profile: No results for input(s): "INR", "PROTIME" in the last 168 hours.  Cardiac Enzymes: No results for input(s): "CKTOTAL", "CKMB", "CKMBINDEX", "TROPONINI" in the last 168 hours.  HbA1C: Hemoglobin A1C  Date/Time Value Ref Range Status  03/09/2011 03:43 AM 8.1 (H) 4.2 - 6.3 % Final    Comment:     The American Diabetes Association recommends that a primary goal of therapy should be <7% and that physicians should reevaluate the treatment regimen in patients with HbA1c values consistently >8%.    Hgb A1c MFr Bld  Date/Time Value Ref Range Status  02/14/2023 01:50 PM 6.7 (H) 4.8 - 5.6 % Final    Comment:    (NOTE) Pre diabetes:          5.7%-6.4%  Diabetes:              >6.4%  Glycemic control for   <7.0% adults with diabetes   06/07/2022 01:34 PM 7.4 (H) 4.8 - 5.6 % Final    Comment:    (NOTE) Pre diabetes:          5.7%-6.4%  Diabetes:              >6.4%  Glycemic control for   <7.0% adults with diabetes     CBG: Recent Labs  Lab 02/26/23 1650 02/26/23 2039 02/27/23 0836 02/27/23 1226 03/03/23 2229  GLUCAP 243* 237* 193* 180* 168*    Review of Systems:   Unable to be obtained secondary  to the patient's altered mental status.   Past Medical History  He,  has a past medical history of Arthritis, Cellulitis, CHF (congestive heart failure) (HCC), Chronic diastolic heart failure (HCC), Chronic respiratory failure with hypoxia (HCC), Diabetes mellitus type 2, insulin dependent (HCC), Diabetic foot infection (HCC) (06/07/2022), Edema, GERD (gastroesophageal reflux disease), Hepatic steatosis, HOH (hard of hearing), Hypertension, Morbid obesity (HCC), Morbid obesity with BMI of 45.0-49.9, adult (HCC), Neuropathy, Orthopnea, OSA on CPAP, Oxygen deficiency, PVD (peripheral vascular disease) (HCC), Shortness of breath dyspnea, and Systolic heart failure (HCC).   Surgical History    Past Surgical History:  Procedure Laterality Date   AMPUTATION Right 10/02/2014   Procedure: AMPUTATION RAY;  Surgeon: Recardo Evangelist, DPM;  Location: [facility] ORS;  Service: Podiatry;  Laterality: Right;   AMPUTATION TOE Right 06/09/2022   Procedure: AMPUTATION 2ND TOE RIGHT FOOT;  Surgeon: Candelaria Stagers, DPM;  Location: [facility] ORS;  Service: Podiatry;  Laterality: Right;   CATARACT EXTRACTION  W/PHACO Right 09/10/2015   Procedure: CATARACT EXTRACTION PHACO AND INTRAOCULAR LENS PLACEMENT (IOC);  Surgeon: Galen Manila, MD;  Location: [facility] ORS;  Service: Ophthalmology;  Laterality: Right;  [place] 01:34 AP% 19.3 CDE 18.36 Fluid pack lot # [id number]   CATARACT EXTRACTION W/PHACO Left 10/06/2015   Procedure: CATARACT EXTRACTION PHACO AND INTRAOCULAR LENS PLACEMENT (IOC);  Surgeon: Galen Manila, MD;  Location: [facility] ORS;  Service: Ophthalmology;  Laterality: Left;  [place] 00:56 AP% 19.5 CDE 11.02 Fluid Pack # [id number]   CHOLECYSTECTOMY     COLONOSCOPY WITH PROPOFOL N/A 05/17/2016   Procedure: COLONOSCOPY WITH PROPOFOL;  Surgeon: Christena Deem, MD;  Location: [facility] ENDOSCOPY;  Service: Endoscopy;  Laterality: N/A;   COLONOSCOPY WITH PROPOFOL N/A 12/27/2021   Procedure: COLONOSCOPY WITH PROPOFOL;  Surgeon: Wyline Mood, MD;  Location: [facility] ENDOSCOPY;  Service: Gastroenterology;  Laterality: N/A;   EYE SURGERY Left    bilat laser   HIP SURGERY Right    8 th grade pins   LOWER EXTREMITY ANGIOGRAPHY Right 06/08/2022   Procedure: Lower Extremity Angiography;  Surgeon: Renford Dills, MD;  Location: [facility] INVASIVE CV LAB;  Service: Cardiovascular;  Laterality: Right;   NOSE SURGERY     PERIPHERAL VASCULAR CATHETERIZATION Right 10/02/2014   Procedure: Lower Extremity Angiography;  Surgeon: Annice Needy, MD;  Location: [facility] INVASIVE CV LAB;  Service: Cardiovascular;  Laterality: Right;   TOE AMPUTATION Left 2010   2cd   TOE AMPUTATION Right 2009     Social History   reports that he has never smoked. He has never used smokeless tobacco. He reports that he does not drink alcohol and does not use drugs.   Family History   His family history includes Diabetes in his mother; Heart attack in his father; Hypertension in his father and mother; Stroke in his father.   Allergies No Known Allergies   Home Medications  Prior to Admission medications   Medication Sig Start Date End Date Taking?  Authorizing Provider  aspirin EC 81 MG tablet Take 81 mg by mouth daily.    [provider]  atorvastatin (LIPITOR) 40 MG tablet atorvastatin 40 mg tablet    [provider]  BYETTA 10 MCG PEN 10 MCG/0.04ML SOPN injection Inject 10 mcg into the skin 2 (two) times daily with a meal. 12/26/22   [provider]  carvedilol (COREG) 25 MG tablet Take 1 tablet (25 mg total) by mouth 2 (two) times daily with a meal. 02/26/23   Arnetha Courser, MD  clopidogrel (  PLAVIX) 75 MG tablet Take 1 tablet (75 mg total) by mouth daily for 21 days. 02/27/23 03/20/23  Arnetha Courser, MD  empagliflozin (JARDIANCE) 25 MG TABS tablet Take by mouth daily. Once daily in the am    [provider]  Fe Fum-Vit C-Vit B12-FA (TRIGELS-F FORTE) CAPS capsule Take 1 capsule by mouth 2 (two) times daily. 02/26/23   Arnetha Courser, MD  gabapentin (NEURONTIN) 300 MG capsule Take 300 mg by mouth 3 (three) times daily.    [provider]  insulin aspart (NOVOLOG) 100 UNIT/ML injection Inject into the skin 3 (three) times daily before meals.    [provider]  isosorbide mononitrate (IMDUR) 60 MG 24 hr tablet Take 1 tablet (60 mg total) by mouth at bedtime. 02/26/23   Arnetha Courser, MD  LANTUS 100 UNIT/ML injection Inject 90 Units into the skin 2 (two) times daily. 04/28/22   [provider]  metFORMIN (GLUCOPHAGE) 1000 MG tablet Take 1,000 mg by mouth 2 (two) times daily with a meal.    [provider]  Multiple Vitamins-Minerals (CENTRUM SILVER 50+MEN) TABS Take 1 tablet by mouth daily.    [provider]  OXYGEN Inhale 2 L into the lungs daily.    [provider]  pantoprazole (PROTONIX) 40 MG tablet Take 40 mg by mouth daily.    [provider]  polyethylene glycol (MIRALAX / GLYCOLAX) 17 g packet Take 17 g by mouth daily as needed for mild constipation. 02/26/23   Arnetha Courser, MD  tamsulosin (FLOMAX) 0.4 MG CAPS capsule Take 1 capsule (0.4 mg  total) by mouth daily after supper. 02/26/23   Arnetha Courser, MD  torsemide (DEMADEX) 20 MG tablet Take 2 tablets (40 mg total) by mouth daily. 05/01/17   Rai, Delene Ruffini, MD  TRUEPLUS INSULIN SYRINGE 29G X 1/2" 1 ML MISC  02/16/18   [provider]  TRUEPLUS PEN NEEDLES 31G X 8 MM MISC  02/06/18   [provider]  valsartan (DIOVAN) 160 MG tablet Take 160 mg by mouth daily. 07/11/22   [provider]  Scheduled Meds:  aspirin  81 mg Oral Daily   clopidogrel  75 mg Oral Q breakfast   insulin aspart  0-9 Units Subcutaneous Q4H   vancomycin variable dose per unstable renal function (pharmacist dosing)   Does not apply See admin instructions   Continuous Infusions:  sodium chloride 250 mL (03/04/23 0113)   sodium chloride 1 mL/kg/hr (03/04/23 0100)   ceFEPime (MAXIPIME) IV 2 g (03/04/23 0116)   heparin Stopped (03/04/23 0316)   norepinephrine (LEVOPHED) Adult infusion 6 mcg/min (03/04/23 0341)   vancomycin     PRN Meds:.acetaminophen, docusate sodium, ondansetron (ZOFRAN) IV, oxyCODONE-acetaminophen, polyethylene glycol, sodium chloride flush   Active Hospital Problem list   See systems below  Assessment & Plan:  #Acute on Chronic Hypoxic Hypercapnic Respiratory Failure secondary to  #Acute on chronic systolic heart failure #OSA on CPAP -Supplemental O2 as needed to maintain O2 saturations 88 to 92% -BiPAP, wean as tolerated -High risk for intubation -Follow intermittent ABG and chest x-ray as needed -Hold Lasix in the setting of AKI and hypotension -As needed bronchodilators   #Inferior STEMI s/p LHC nonrevealing for obstructive CAD #HFpEF prior EF 50-55% with new reduced systolic function with LVEF 450% on 03/03/23 #Cardiogenic Shock PMHx:  HLD  HTN -Trend troponin until peaked -Start Heparin gtt -Aspirin 81 + Plavix 75 every day -For afterload reduction: Hold isosorbide -GDMT: Hold carvedilol,Jardiance, iso hypotension -Daily APTT,  CBC -Lipitor 40mg   daily  -Cardiology following appreciate input   #Leukocytosis likely reactive in the setting of STEMI No obvious source of infection, chest x-ray neg, CT? Cellulitis and UA pending -F/u cultures, trend lactic/ PCT -Monitor WBC/ fever curve -continue empiric antibiotics for now pending cultures -Pressors for MAP goal >65 -Strict I/O's   #AKI  likely prerenal in the setting of shock as above -trend lactate -Monitor I&O's / urinary output -Follow BMP -Ensure adequate renal perfusion -Avoid nephrotoxic agents as able -Replace electrolytes as indicated -Nephrology consult as appropriate  #T2DM  Diabetic Neuropathy  Recent A1c 6.7.  -CBGs q4 -Sliding scale insulin -Follow ICU hyper/hypoglycemia protocol -Hold home Meds while on SSI  #Acute Metabolic Encephalopathy  Recent CVA -CTH -Obtain follow up MRI Brain -Provide supportive care -BiPAP to treat Hypercapnia     Best practice:  Diet:  NPO Pain/Anxiety/Delirium protocol (if indicated): No VAP protocol (if indicated): Not indicated DVT prophylaxis: Systemic AC GI prophylaxis: PPI Glucose control:  SSI Yes Central venous access:  N/A Arterial line:  N/A Foley:  Yes, and it is still needed Mobility:  bed rest  PT consulted: N/A Last date of multidisciplinary goals of care discussion [Updated sister at the bedside] Code Status:  full code Disposition: ICU   = Goals of Care = Code Status Order: FULL  Primary Emergency Contact: Dennis,Glenda Wishes to pursue full aggressive treatment and intervention options, including CPR and intubation, but goals of care will be addressed on going with family if that should become necessary.  Critical care time: 45 minutes       Webb Silversmith DNP, CCRN, FNP-C, AGACNP-BC Acute Care & Family Nurse Practitioner [facility] PCCM on call pager [phone]

## 2023-03-03 NOTE — Progress Notes (Signed)
   03/03/23 1952  Spiritual Encounters  Type of Visit Initial  Care provided to: Pt and family  Conversation partners present during encounter Nurse;Physician  Referral source Code page  Reason for visit Code  OnCall Visit Yes  Spiritual Framework  Presenting Themes Values and beliefs;Community and relationships;Caregiving needs;Impactful experiences and emotions  Community/Connection Family  Patient Stress Factors Health changes;Exhausted  Family Stress Factors None identified  Intervention Outcomes  Outcomes Reduced anxiety   Chaplain responded to a Code STEMI in ED.  Patient was being cared for by medical team and then moved to Cath Lab and ICU.  Chaplain provided compassionate care to family, active listening and empathy.  Chaplain prayed with patient and family.  Chaplain will share with PRN Chaplain for follow-up.  Rev. Rana M. Earlene Plater, MDiv. Chaplain Resident [facility]

## 2023-03-03 NOTE — Consult Note (Signed)
[facility] Cardiology  CARDIOLOGY CONSULT NOTE  [name] MRN: [id number] DOB/AGE: [birth year] 58 y.o.  Admit date: 03/03/2023 Referring Physician Fuller Plan Primary Physician Martie Round NP Primary Cardiologist  Reason for Consultation possible inferior STEMI  HPI: 58 year old gentleman referred for possible inferior STEMI.  Patient has a history of morbid obesity, diabetes, OSA on CPAP and chronic HFpEF, who presents with EKG suggestive of inferolateral STEMI and elevated troponin.  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  Patient was discharged home 02/27/2023 at which time BUN and creatinine were 42 and 1.30, respectively.  The patient returns today via EMS, called by his girlfriend who noted he had not been eating or drinking well, not urinating, and ran out of oxygen.  Upon arrival to the ED repeat head CT was performed which did not reveal any new findings.  The patient was placed on 4 L of oxygen.  ECG revealed inferolateral ST elevations suggestive of STEMI.  Patient labs revealed elevated troponin of 1575, and code STEMI was called.  The patient underwent urgent cardiac catheterization which did not reveal any obstructive coronary artery disease.  Left ventriculography revealed mildly reduced left ventricular function with apical hypokinesis.  Review of systems complete and found to be negative unless listed above     Past Medical History:  Diagnosis Date   Arthritis    hands   Cellulitis    CHF (congestive heart failure) (HCC)    Chronic diastolic heart failure (HCC)    Chronic respiratory failure with hypoxia (HCC)    2 L  continuously   Diabetes mellitus type 2, insulin dependent (HCC)    Diabetic foot infection (HCC) 06/07/2022   Edema    FEET/LEGS    GERD (gastroesophageal reflux disease)    Hepatic steatosis    HOH (hard of hearing)    Hypertension    Morbid obesity (HCC)    Morbid obesity with BMI of 45.0-49.9, adult (HCC)    Neuropathy    Orthopnea    OSA on CPAP    TRILOGY VENTILATOR   Oxygen deficiency    2L  HS   PVD (peripheral vascular disease) (HCC)    Shortness of breath dyspnea    Systolic heart failure (HCC)    Preserved EF 50-55%    Past Surgical History:  Procedure Laterality Date   AMPUTATION Right 10/02/2014   Procedure: AMPUTATION RAY;  Surgeon: Recardo Evangelist, DPM;  Location: [facility] ORS;  Service: Podiatry;  Laterality: Right;   AMPUTATION TOE Right 06/09/2022   Procedure: AMPUTATION 2ND TOE RIGHT FOOT;  Surgeon: Candelaria Stagers, DPM;  Location: [facility] ORS;  Service: Podiatry;  Laterality: Right;   CATARACT EXTRACTION W/PHACO Right 09/10/2015   Procedure: CATARACT EXTRACTION PHACO AND INTRAOCULAR LENS PLACEMENT (IOC);  Surgeon: Galen Manila, MD;  Location: [facility] ORS;  Service: Ophthalmology;  Laterality: Right;  [place] 01:34 AP% 19.3 CDE 18.36 Fluid pack lot # [id number]   CATARACT EXTRACTION W/PHACO Left 10/06/2015   Procedure: CATARACT EXTRACTION PHACO AND INTRAOCULAR LENS PLACEMENT (IOC);  Surgeon: Galen Manila, MD;  Location: [facility] ORS;  Service: Ophthalmology;  Laterality: Left;  [place] 00:56 AP% 19.5 CDE 11.02 Fluid Pack # [id number]   CHOLECYSTECTOMY     COLONOSCOPY WITH PROPOFOL N/A 05/17/2016   Procedure: COLONOSCOPY WITH PROPOFOL;  Surgeon: Christena Deem, MD;  Location: [facility]COPY;  Service:  Endoscopy;  Laterality: N/A;   COLONOSCOPY WITH PROPOFOL N/A 12/27/2021   Procedure: COLONOSCOPY WITH PROPOFOL;  Surgeon: Wyline Mood, MD;  Location: [facility] ENDOSCOPY;  Service: Gastroenterology;  Laterality: N/A;   EYE SURGERY Left    bilat laser   HIP SURGERY Right    8 th grade pins   LOWER EXTREMITY ANGIOGRAPHY Right 06/08/2022   Procedure: Lower Extremity Angiography;  Surgeon: Renford Dills, MD;   Location: [facility] INVASIVE CV LAB;  Service: Cardiovascular;  Laterality: Right;   NOSE SURGERY     PERIPHERAL VASCULAR CATHETERIZATION Right 10/02/2014   Procedure: Lower Extremity Angiography;  Surgeon: Annice Needy, MD;  Location: [facility] INVASIVE CV LAB;  Service: Cardiovascular;  Laterality: Right;   TOE AMPUTATION Left 2010   2cd   TOE AMPUTATION Right 2009    Medications Prior to Admission  Medication Sig Dispense Refill Last Dose/Taking   aspirin EC 81 MG tablet Take 81 mg by mouth daily.      atorvastatin (LIPITOR) 40 MG tablet atorvastatin 40 mg tablet      BYETTA 10 MCG PEN 10 MCG/0.04ML SOPN injection Inject 10 mcg into the skin 2 (two) times daily with a meal.      carvedilol (COREG) 25 MG tablet Take 1 tablet (25 mg total) by mouth 2 (two) times daily with a meal. 60 tablet 1    clopidogrel (PLAVIX) 75 MG tablet Take 1 tablet (75 mg total) by mouth daily for 21 days. 21 tablet 0    empagliflozin (JARDIANCE) 25 MG TABS tablet Take by mouth daily. Once daily in the am      Fe Fum-Vit C-Vit B12-FA (TRIGELS-F FORTE) CAPS capsule Take 1 capsule by mouth 2 (two) times daily. 120 capsule 2    gabapentin (NEURONTIN) 300 MG capsule Take 300 mg by mouth 3 (three) times daily.      insulin aspart (NOVOLOG) 100 UNIT/ML injection Inject into the skin 3 (three) times daily before meals.      isosorbide mononitrate (IMDUR) 60 MG 24 hr tablet Take 1 tablet (60 mg total) by mouth at bedtime. 30 tablet 1    LANTUS 100 UNIT/ML injection Inject 90 Units into the skin 2 (two) times daily.      metFORMIN (GLUCOPHAGE) 1000 MG tablet Take 1,000 mg by mouth 2 (two) times daily with a meal.      Multiple Vitamins-Minerals (CENTRUM SILVER 50+MEN) TABS Take 1 tablet by mouth daily.      OXYGEN Inhale 2 L into the lungs daily.      pantoprazole (PROTONIX) 40 MG tablet Take 40 mg by mouth daily.      polyethylene glycol (MIRALAX / GLYCOLAX) 17 g packet Take 17 g by mouth daily as needed for mild constipation. 14  each 0    tamsulosin (FLOMAX) 0.4 MG CAPS capsule Take 1 capsule (0.4 mg total) by mouth daily after supper. 30 capsule 2    torsemide (DEMADEX) 20 MG tablet Take 2 tablets (40 mg total) by mouth daily. 60 tablet 2    TRUEPLUS INSULIN SYRINGE 29G X 1/2" 1 ML MISC       TRUEPLUS PEN NEEDLES 31G X 8 MM MISC       valsartan (DIOVAN) 160 MG tablet Take 160 mg by mouth daily.      Social History   Socioeconomic History   Marital status: Divorced    Spouse name: Not on file   Number of children: Not on file   Years of education: Not  on file   Highest education level: Not on file  Occupational History    Employer: UNEMPLOYED  Tobacco Use   Smoking status: Never   Smokeless tobacco: Never  Vaping Use   Vaping status: Never Used  Substance and Sexual Activity   Alcohol use: No    Alcohol/week: 0.0 standard drinks of alcohol   Drug use: No   Sexual activity: Not Currently  Other Topics Concern   Not on file  Social History Narrative   Not on file   Social Drivers of Health   Financial Resource Strain: Not on file  Food Insecurity: No Food Insecurity (02/15/2023)   Hunger Vital Sign    Worried About Running Out of Food in the Last Year: Never true    Ran Out of Food in the Last Year: Never true  Transportation Needs: No Transportation Needs (02/15/2023)   PRAPARE - Administrator, Civil Service (Medical): No    Lack of Transportation (Non-Medical): No  Physical Activity: Not on file  Stress: Not on file  Social Connections: Not on file  Intimate Partner Violence: Not At Risk (02/15/2023)   Humiliation, Afraid, Rape, and Kick questionnaire    Fear of Current or Ex-Partner: No    Emotionally Abused: No    Physically Abused: No    Sexually Abused: No    Family History  Problem Relation Age of Onset   Hypertension Mother    Diabetes Mother    Stroke Father    Hypertension Father    Heart attack Father       Review of systems complete and found to be negative  unless listed above      PHYSICAL EXAM  General: Well developed, well nourished, in no acute distress HEENT:  Normocephalic and atramatic Neck:  No JVD.  Lungs: Clear bilaterally to auscultation and percussion. Heart: HRRR . Normal S1 and S2 without gallops or murmurs.  Abdomen: Bowel sounds are positive, abdomen soft and non-tender  Msk:  Back normal, normal gait. Normal strength and tone for age. Extremities: No clubbing, cyanosis or edema.   Neuro: Alert and oriented X 3. Psych:  Good affect, responds appropriately  Labs:   Lab Results  Component Value Date   WBC 13.1 (H) 03/03/2023   HGB 10.6 (L) 03/03/2023   HCT 38.3 (L) 03/03/2023   MCV 78.2 (L) 03/03/2023   PLT 387 03/03/2023    Recent Labs  Lab 03/03/23 1915  NA 135  K 4.1  CL 96*  CO2 25  BUN 45*  CREATININE 3.44*  CALCIUM 8.2*  PROT 6.8  BILITOT 0.6  ALKPHOS 55  ALT 19  AST 15  GLUCOSE 157*   Lab Results  Component Value Date   TROPONINI <0.03 04/29/2017    Lab Results  Component Value Date   CHOL 54 02/15/2023   Lab Results  Component Value Date   HDL 38 (L) 02/15/2023   Lab Results  Component Value Date   LDLCALC 12 02/15/2023   Lab Results  Component Value Date   TRIG 20 02/15/2023   Lab Results  Component Value Date   CHOLHDL 1.4 02/15/2023   No results found for: "LDLDIRECT"    Radiology: CARDIAC CATHETERIZATION Result Date: 03/03/2023   There is mild left ventricular systolic dysfunction.   LV end diastolic pressure is mildly elevated.   The left ventricular ejection fraction is 45-50% by visual estimate. 1.  Normal coronary anatomy 2.  Mildly reduced left ventricular function with estimated  LV ejection fraction 45% with apical hypokinesis 3.  Acute renal failure 4.  Recent CVA Recommendations 1.  Medical therapy 2.  Continue aspirin and clopidogrel, following recent CVA 3.  Repeat 2D echocardiogram 4.  Nephrology consult   CT Renal Stone Study Result Date: 03/03/2023 CLINICAL  DATA:  Abdominal/flank pain, stone suspected EXAM: CT ABDOMEN AND PELVIS WITHOUT CONTRAST TECHNIQUE: Multidetector CT imaging of the abdomen and pelvis was performed following the standard protocol without IV contrast. RADIATION DOSE REDUCTION: This exam was performed according to the departmental dose-optimization program which includes automated exposure control, adjustment of the mA and/or kV according to patient size and/or use of iterative reconstruction technique. COMPARISON:  08/06/2007 FINDINGS: Lower chest: No acute abnormality. Hepatobiliary: No focal liver abnormality is seen. Status post cholecystectomy. No biliary dilatation. Pancreas: Unremarkable Spleen: Unremarkable Adrenals/Urinary Tract: Adrenal glands are unremarkable. Kidneys are normal, without renal calculi, focal lesion, or hydronephrosis. Bladder is unremarkable. Stomach/Bowel: Stomach is within normal limits. Appendix appears normal. No evidence of bowel wall thickening, distention, or inflammatory changes. Vascular/Lymphatic: Mild visceral arteriosclerosis. The abdominopelvic vasculature is otherwise unremarkable in this noncontrast examination. No pathologic adenopathy within the abdomen and pelvis. Reproductive: Prostate is unremarkable. Other: There is asymmetric subcutaneous edema and dermal thickening involving the pannus which may relate to passive a though a superficial inflammatory process such as cellulitis, could appear similarly. Small fat containing umbilical hernia. Musculoskeletal: The osseous structures are age-appropriate. No acute bone abnormality. No lytic or blastic bone lesions are identified. Bilateral hip effusions are suspected, well assessed on this examination. IMPRESSION: 1. No acute intra-abdominal pathology identified. No definite radiographic explanation for the patient's reported symptoms. 2. Asymmetric subcutaneous edema and dermal thickening involving the pannus which may relate to passive a though a  superficial inflammatory process such as cellulitis, could appear similarly. 3. Bilateral hip effusions are suspected, well assessed on this examination. 4. Mild visceral arteriosclerosis. Electronically Signed   By: Helyn Numbers M.D.   On: 03/03/2023 20:37   CT HEAD WO CONTRAST ( ) Result Date: 03/03/2023 CLINICAL DATA:  Headache, new onset (Age >= 51y) EXAM: CT HEAD WITHOUT CONTRAST TECHNIQUE: Contiguous axial images were obtained from the base of the skull through the vertex without intravenous contrast. RADIATION DOSE REDUCTION: This exam was performed according to the departmental dose-optimization program which includes automated exposure control, adjustment of the mA and/or kV according to patient size and/or use of iterative reconstruction technique. COMPARISON:  MRI 02/14/2023. FINDINGS: Brain: Evolving right frontal infarct. No definite evidence of acute/interval large vascular territory infarct. Remote bilateral cerebellar infarcts and remote left parietal infarct. Vascular: No hyperdense vessel identified. Skull: No acute fracture. Sinuses/Orbits: Mild paranasal sinus mucosal thickening. IMPRESSION: 1. Evolving right frontal infarct. No evidence of new/interval acute abnormality; however, MRI could provide more sensitive evaluation for new peri-infarct ischemia if clinically warranted. 2. Remote cerebellar and left parietal infarcts. Electronically Signed   By: Feliberto Harts M.D.   On: 03/03/2023 20:20   DG Chest Portable 1 View Result Date: 03/03/2023 CLINICAL DATA:  Shortness of breath EXAM: PORTABLE CHEST 1 VIEW COMPARISON:  02/14/2023 FINDINGS: Similar cardiomegaly. Low lung volumes. Pulmonary vascular congestion. Bibasilar atelectasis. Otherwise no focal consolidation. No definite pleural effusion or pneumothorax. No pneumothorax. IMPRESSION: Cardiomegaly with pulmonary vascular congestion. Electronically Signed   By: Minerva Fester M.D.   On: 03/03/2023 20:14   ECHOCARDIOGRAM  COMPLETE Result Date: 02/15/2023    ECHOCARDIOGRAM REPORT   Patient Name:   Raymond [name] [facility] Date of Exam: 02/15/2023 Medical  Rec #:  [id number]                   Height:       74.0 in Accession #:    [id number]                  Weight:       447.4 lb Date of Birth:  08-20-1965                   BSA:          3.060 m Patient Age:    57 years                    BP:           131/56 mmHg Patient Gender: M                           HR:           74 bpm. Exam Location:  [facility] Procedure: 2D Echo, Cardiac Doppler, Color Doppler and Intracardiac            Opacification Agent Indications:     Stroke  History:         Patient has prior history of Echocardiogram examinations, most                  recent 09/10/2022. CHF, Stroke and COPD; Risk                  Factors:Hypertension, Sleep Apnea, Diabetes and Dyslipidemia.  Sonographer:     Mikki Harbor Referring Phys:  [id number] SUMAYYA AMIN Diagnosing Phys: Windell Norfolk  Sonographer Comments: Technically challenging study due to limited acoustic windows and patient is obese. Image acquisition challenging due to COPD. IMPRESSIONS  1. Technically difficult study. Definity used to optimize the study.  2. Left ventricular ejection fraction, by estimation, is 55 to 60%. The left ventricle has normal function. The left ventricle has no regional wall motion abnormalities. There is moderate concentric left ventricular hypertrophy. Left ventricular diastolic parameters are indeterminate.  3. Right ventricular systolic function was not well visualized. The right ventricular size is not well visualized. There is normal pulmonary artery systolic pressure.  4. The mitral valve is normal in structure. Trivial mitral valve regurgitation.  5. The aortic valve is tricuspid. Aortic valve regurgitation is not visualized. FINDINGS  Left Ventricle: Left ventricular ejection fraction, by estimation, is 55 to 60%. The left ventricle has normal function. The left ventricle has no  regional wall motion abnormalities. Definity contrast agent was given IV to delineate the left ventricular  endocardial borders. The left ventricular internal cavity size was normal in size. There is moderate concentric left ventricular hypertrophy. Left ventricular diastolic parameters are indeterminate. Right Ventricle: The right ventricular size is not well visualized. Right vetricular wall thickness was not well visualized. Right ventricular systolic function was not well visualized. There is normal pulmonary artery systolic pressure. The tricuspid regurgitant velocity is 1.91 m/s, and with an assumed right atrial pressure of 8 mmHg, the estimated right ventricular systolic pressure is 22.6 mmHg. Left Atrium: Left atrial size was not well visualized. Right Atrium: Right atrial size was not well visualized. Pericardium: There is no evidence of pericardial effusion. Mitral Valve: The mitral valve is normal in structure. Trivial mitral valve regurgitation. MV peak gradient, 4.5 mmHg. The mean mitral valve gradient is 2.0 mmHg. Tricuspid Valve: The tricuspid  valve is normal in structure. Tricuspid valve regurgitation is trivial. Aortic Valve: The aortic valve is tricuspid. Aortic valve regurgitation is not visualized. Aortic valve mean gradient measures 3.0 mmHg. Aortic valve peak gradient measures 7.8 mmHg. Aortic valve area, by VTI measures 3.28 cm. Pulmonic Valve: The pulmonic valve was not well visualized. Pulmonic valve regurgitation is not visualized. Aorta: The aortic root is normal in size and structure. Venous: The inferior vena cava was not well visualized. IAS/Shunts: The interatrial septum was not well visualized.  LEFT VENTRICLE PLAX 2D LVIDd:         5.90 cm LVIDs:         4.30 cm LV PW:         1.70 cm LV IVS:        1.50 cm LVOT diam:     2.10 cm LV SV:         72 LV SV Index:   24 LVOT Area:     3.46 cm  LEFT ATRIUM         Index LA diam:    4.90 cm 1.60 cm/m  AORTIC VALVE                     PULMONIC VALVE AV Area (Vmax):    2.57 cm     PV Vmax:       1.30 m/s AV Area (Vmean):   3.05 cm     PV Peak grad:  6.8 mmHg AV Area (VTI):     3.28 cm AV Vmax:           140.00 cm/s AV Vmean:          79.900 cm/s AV VTI:            0.221 m AV Peak Grad:      7.8 mmHg AV Mean Grad:      3.0 mmHg LVOT Vmax:         104.00 cm/s LVOT Vmean:        70.300 cm/s LVOT VTI:          0.209 m LVOT/AV VTI ratio: 0.95  AORTA Ao Root diam: 3.70 cm MITRAL VALVE               TRICUSPID VALVE MV Area (PHT): 4.10 cm    TR Peak grad:   14.6 mmHg MV Area VTI:   3.16 cm    TR Vmax:        191.00 cm/s MV Peak grad:  4.5 mmHg MV Mean grad:  2.0 mmHg    SHUNTS MV Vmax:       1.06 m/s    Systemic VTI:  0.21 m MV Vmean:      57.9 cm/s   Systemic Diam: 2.10 cm MV Decel Time: 185 msec MV E velocity: 87.00 cm/s Windell Norfolk Electronically signed by Windell Norfolk Signature Date/Time: 02/15/2023/5:23:45 PM    Final    MR BRAIN WO CONTRAST Result Date: 02/14/2023 CLINICAL DATA:  Neuro deficit, acute, stroke suspected. EXAM: MRI HEAD WITHOUT CONTRAST TECHNIQUE: Multiplanar, multiecho pulse sequences of the brain and surrounding structures were obtained without intravenous contrast. COMPARISON:  CT studies earlier same day FINDINGS: Brain: Diffusion imaging shows acute cortical and subcortical infarction in the right posterior frontal lobe. Petechial blood products present in the region without focal hematoma. No other acute insult. Otherwise, there are old cerebellar infarctions on the right. No focal brainstem insult. 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 Few scattered old  small vessel infarctions in both hemispheres affecting the white matter. Few other small foci of hemosiderin deposition related to old infarctions. No mass, hydrocephalus or extra-axial collection. Vascular: Major vessels at the base of the brain show flow. Skull and upper cervical spine: Negative Sinuses/Orbits: Mild mucosal inflammatory changes of  the paranasal sinuses. No advanced finding. Orbits negative. Other: None IMPRESSION: 1. Acute cortical and subcortical infarction in the right posterior frontal lobe. Petechial blood products present in the region without focal hematoma. 2. 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 Few scattered old small vessel infarctions in both hemispheres affecting the white matter. Electronically Signed   By: Paulina Fusi M.D.   On: 02/14/2023 14:42   DG Chest Port 1 View Result Date: 02/14/2023 CLINICAL DATA:  Shortness of breath. EXAM: PORTABLE CHEST 1 VIEW COMPARISON:  Chest radiograph dated September 09, 2022. FINDINGS: Low lung volumes. The heart is mildly enlarged, unchanged. Bilateral interstitial prominence. No focal consolidation, sizeable pleural effusion, or pneumothorax identified. No acute osseous abnormality. IMPRESSION: Low lung volumes. Cardiomegaly with findings suggestive of pulmonary vascular congestion. Electronically Signed   By: Hart Robinsons M.D.   On: 02/14/2023 08:48   CT Angio Head Neck W WO CM (CODE STROKE) Result Date: 02/14/2023 CLINICAL DATA:  Last known well 2200 hours EXAM: CT ANGIOGRAPHY HEAD AND NECK CT PERFUSION BRAIN TECHNIQUE: Multidetector CT imaging of the head and neck was performed using the standard protocol during bolus administration of intravenous contrast. Multiplanar CT image reconstructions and MIPs were obtained to evaluate the vascular anatomy. Carotid stenosis measurements (when applicable) are obtained utilizing NASCET criteria, using the distal internal carotid diameter as the denominator. Multiphase CT imaging of the brain was performed following IV bolus contrast injection. Subsequent parametric perfusion maps were calculated using RAPID software. RADIATION DOSE REDUCTION: This exam was performed according to the departmental dose-optimization program which includes automated exposure control, adjustment of the mA and/or  kV according to patient size and/or use of iterative reconstruction technique. CONTRAST:  75mL OMNIPAQUE IOHEXOL 350 MG/ML SOLN; 40mL OMNIPAQUE IOHEXOL 350 MG/ML SOLN COMPARISON:  Head CT from earlier today FINDINGS: CTA NECK FINDINGS Aortic arch: Suboptimal bolus density, no significant finding. Right carotid system: Partial retropharyngeal course. No stenosis or luminal irregularity. Left carotid system: Partial retropharyngeal course. Mild calcified plaque at the proximal ICA. No stenosis or luminal irregularity. Vertebral arteries: Soft tissue attenuation and suboptimal bolus density affects detailed visualization of the vertebral arteries, especially proximally. Both vertebral arteries are symmetrically enhancing at the skull base. No proximal subclavian stenosis seen. Skeleton: No acute finding Other neck: No acute finding Upper chest: Clear apical lungs Review of the MIP images confirms the above findings CTA HEAD FINDINGS Anterior circulation: No significant stenosis, proximal occlusion, aneurysm, or vascular malformation. Posterior circulation: Hypoplastic P1 segments, especially on the left. No major branch occlusion, beading, or aneurysm. Venous sinuses: Unremarkable Anatomic variants: As above Review of the MIP images confirms the above findings CT perfusion: Motion artifact, possibly exacerbated by ACA arterial input selection, causes noncontributory highlighting of most of the brain on the time to peak maps. IMPRESSION: CTA: 1. No emergent finding. 2. Minimal calcified atherosclerosis. 3. Suboptimal bolus density and soft tissue attenuation limits visualization of arteries in the neck, especially the proximal vertebral arteries. CT perfusion: Noncontributory due to artifact. Electronically Signed   By: Tiburcio Pea M.D.   On: 02/14/2023 07:24   CT CEREBRAL PERFUSION W CONTRAST Result Date: 02/14/2023 CLINICAL DATA:  Last known well  2200 hours EXAM: CT ANGIOGRAPHY HEAD AND NECK CT PERFUSION BRAIN  TECHNIQUE: Multidetector CT imaging of the head and neck was performed using the standard protocol during bolus administration of intravenous contrast. Multiplanar CT image reconstructions and MIPs were obtained to evaluate the vascular anatomy. Carotid stenosis measurements (when applicable) are obtained utilizing NASCET criteria, using the distal internal carotid diameter as the denominator. Multiphase CT imaging of the brain was performed following IV bolus contrast injection. Subsequent parametric perfusion maps were calculated using RAPID software. RADIATION DOSE REDUCTION: This exam was performed according to the departmental dose-optimization program which includes automated exposure control, adjustment of the mA and/or kV according to patient size and/or use of iterative reconstruction technique. CONTRAST:  75mL OMNIPAQUE IOHEXOL 350 MG/ML SOLN; 40mL OMNIPAQUE IOHEXOL 350 MG/ML SOLN COMPARISON:  Head CT from earlier today FINDINGS: CTA NECK FINDINGS Aortic arch: Suboptimal bolus density, no significant finding. Right carotid system: Partial retropharyngeal course. No stenosis or luminal irregularity. Left carotid system: Partial retropharyngeal course. Mild calcified plaque at the proximal ICA. No stenosis or luminal irregularity. Vertebral arteries: Soft tissue attenuation and suboptimal bolus density affects detailed visualization of the vertebral arteries, especially proximally. Both vertebral arteries are symmetrically enhancing at the skull base. No proximal subclavian stenosis seen. Skeleton: No acute finding Other neck: No acute finding Upper chest: Clear apical lungs Review of the MIP images confirms the above findings CTA HEAD FINDINGS Anterior circulation: No significant stenosis, proximal occlusion, aneurysm, or vascular malformation. Posterior circulation: Hypoplastic P1 segments, especially on the left. No major branch occlusion, beading, or aneurysm. Venous sinuses: Unremarkable Anatomic  variants: As above Review of the MIP images confirms the above findings CT perfusion: Motion artifact, possibly exacerbated by ACA arterial input selection, causes noncontributory highlighting of most of the brain on the time to peak maps. IMPRESSION: CTA: 1. No emergent finding. 2. Minimal calcified atherosclerosis. 3. Suboptimal bolus density and soft tissue attenuation limits visualization of arteries in the neck, especially the proximal vertebral arteries. CT perfusion: Noncontributory due to artifact. Electronically Signed   By: Tiburcio Pea M.D.   On: 02/14/2023 07:24   CT HEAD CODE STROKE WO CONTRAST Result Date: 02/14/2023 CLINICAL DATA:  Code stroke.  Acute stroke suspected EXAM: CT HEAD WITHOUT CONTRAST TECHNIQUE: Contiguous axial images were obtained from the base of the skull through the vertex without intravenous contrast. RADIATION DOSE REDUCTION: This exam was performed according to the departmental dose-optimization program which includes automated exposure control, adjustment of the mA and/or kV according to patient size and/or use of iterative reconstruction technique. COMPARISON:  Brain MRI 10/20/2011 FINDINGS: Brain: No evidence of acute infarction, hemorrhage, hydrocephalus, extra-axial collection or mass lesion/mass effect. Small chronic infarcts clustered in the right cerebellum. Small moderate chronic left occipital parietal cortex infarct. Small chronic appearing right posterior frontal cortex infarct. These infarcts pleural accounted for on prior brain MRI. Vascular: No hyperdense vessel or unexpected calcification. Skull: Normal. Negative for fracture or focal lesion. Posterior scalp scarring and reticulation. Sinuses/Orbits: No acute finding ASPECTS [facility] Stroke Program Early CT Score) Not scored without localizing symptom Prelim sent in epic chat. IMPRESSION: Chronic infarcts that are stable from 2013 brain MRI. No hemorrhage or visible acute infarct. Electronically Signed   By:  Tiburcio Pea M.D.   On: 02/14/2023 07:07    EKG: Normal sinus rhythm with 1 to 2 mm ST elevation in leads II to III, aVF, V5 and V6  ASSESSMENT AND PLAN:   1.  Type II MI with EKG initially  showing possible inferolateral STEMI.  Emergent cardiac catheterization did not reveal any obstructive coronary artery disease.  Left ventriculography revealed mild reduced left ventricular function, with estimate LVEF 45% with apical hypokinesis 2.  Acute renal failure with underlying CKD 3.  CVA, MRI 02/14/2023 revealing acute cortical and subcortical infarction right posterior frontal lobe 4.  OSA on CPAP 5.  Type 2 diabetes  Recommendations  1.  Medical therapy 2.  Gentle IV hydration 3.  Furosemide IV as needed 4.  Nephrology consult 5.  Resume home carvedilol 6.  Resume home isosorbide mononitrate 7.  Hold valsartan and empagliflozin for now 8.  Resume home atorvastatin    Signed: Marcina Millard MD,PhD, [facility] 03/03/2023, 10:19 PM

## 2023-03-03 NOTE — ED Triage Notes (Signed)
Pt arrives via [facility] from home with c/o an infection per the family. Per EMS family reports that pt had a stroke on the 14th,  and wear 4L @ baseline.  Pt was at 80% on 4L when EMS arrived. Pt hasn't urinated since 1100, no insulin today and their "speech is off" per family. Pt CBG is normally 270, but was 175 per EMS. Per EMS pt went a few days without a BM and when they had one it was dark. Per EMS pt has wounds on feet and bottom. Pt is A&Ox4 during triage.

## 2023-03-04 ENCOUNTER — Inpatient Hospital Stay: Payer: Medicare HMO

## 2023-03-04 DIAGNOSIS — R4781 Slurred speech: Secondary | ICD-10-CM | POA: Diagnosis not present

## 2023-03-04 DIAGNOSIS — I63411 Cerebral infarction due to embolism of right middle cerebral artery: Secondary | ICD-10-CM

## 2023-03-04 DIAGNOSIS — N179 Acute kidney failure, unspecified: Secondary | ICD-10-CM | POA: Diagnosis not present

## 2023-03-04 DIAGNOSIS — Z6841 Body Mass Index (BMI) 40.0 and over, adult: Secondary | ICD-10-CM

## 2023-03-04 LAB — TROPONIN I (HIGH SENSITIVITY)
Troponin I (High Sensitivity): 1267 ng/L (ref <18)
Troponin I (High Sensitivity): 1327 ng/L (ref <18)
Troponin I (High Sensitivity): 1228 ng/L (ref <18)

## 2023-03-04 LAB — CBC
HCT: 36.4 % — ABNORMAL LOW (ref 39.0–52.0)
Hemoglobin: 10.1 g/dL — ABNORMAL LOW (ref 13.0–17.0)
MCH: 21.5 pg — ABNORMAL LOW (ref 26.0–34.0)
MCHC: 27.7 g/dL — ABNORMAL LOW (ref 30.0–36.0)
MCV: 77.4 fL — ABNORMAL LOW (ref 80.0–100.0)
Platelets: 365 10*3/uL (ref 150–400)
RBC: 4.7 MIL/uL (ref 4.22–5.81)
RDW: 18.8 % — ABNORMAL HIGH (ref 11.5–15.5)
WBC: 10.9 10*3/uL — ABNORMAL HIGH (ref 4.0–10.5)
nRBC: 0 % (ref 0.0–0.2)

## 2023-03-04 LAB — BASIC METABOLIC PANEL
Anion gap: 12 (ref 5–15)
BUN: 47 mg/dL — ABNORMAL HIGH (ref 6–20)
CO2: 25 mmol/L (ref 22–32)
Calcium: 7.7 mg/dL — ABNORMAL LOW (ref 8.9–10.3)
Chloride: 99 mmol/L (ref 98–111)
Creatinine, Ser: 2.9 mg/dL — ABNORMAL HIGH (ref 0.61–1.24)
GFR, Estimated: 24 mL/min — ABNORMAL LOW (ref >60)
Glucose, Bld: 156 mg/dL — ABNORMAL HIGH (ref 70–99)
Potassium: 4 mmol/L (ref 3.5–5.1)
Sodium: 136 mmol/L (ref 135–145)

## 2023-03-04 LAB — PHOSPHORUS: Phosphorus: 7.1 mg/dL — ABNORMAL HIGH (ref 2.5–4.6)

## 2023-03-04 LAB — MRSA NEXT GEN BY PCR, NASAL: MRSA by PCR Next Gen: NOT DETECTED

## 2023-03-04 LAB — GLUCOSE, CAPILLARY
Glucose-Capillary: 128 mg/dL — ABNORMAL HIGH (ref 70–99)
Glucose-Capillary: 132 mg/dL — ABNORMAL HIGH (ref 70–99)
Glucose-Capillary: 147 mg/dL — ABNORMAL HIGH (ref 70–99)
Glucose-Capillary: 168 mg/dL — ABNORMAL HIGH (ref 70–99)
Glucose-Capillary: 205 mg/dL — ABNORMAL HIGH (ref 70–99)
Glucose-Capillary: 228 mg/dL — ABNORMAL HIGH (ref 70–99)
Glucose-Capillary: 233 mg/dL — ABNORMAL HIGH (ref 70–99)

## 2023-03-04 LAB — PROCALCITONIN: Procalcitonin: 0.2 ng/mL

## 2023-03-04 LAB — MAGNESIUM: Magnesium: 2.4 mg/dL (ref 1.7–2.4)

## 2023-03-04 LAB — HEPARIN LEVEL (UNFRACTIONATED): Heparin Unfractionated: <0.1 [IU]/mL — ABNORMAL LOW (ref 0.30–0.70)

## 2023-03-04 MED ORDER — POLYETHYLENE GLYCOL 3350 17 G PO PACK
17.0000 g | PACK | Freq: Every day | ORAL | Status: DC | PRN
Start: 2023-03-04 — End: 2023-03-13

## 2023-03-04 MED ORDER — VANCOMYCIN HCL 2000 MG/400ML IV SOLN
2000.0000 mg | Freq: Once | INTRAVENOUS | Status: AC
  Administered 2023-03-04: 2000 mg via INTRAVENOUS
  Filled 2023-03-04: qty 400

## 2023-03-04 MED ORDER — VANCOMYCIN HCL 500 MG/100ML IV SOLN
500.0000 mg | Freq: Once | INTRAVENOUS | Status: AC
  Administered 2023-03-04: 500 mg via INTRAVENOUS
  Filled 2023-03-04: qty 100

## 2023-03-04 MED ORDER — HEPARIN (PORCINE) 25000 UT/250ML-% IV SOLN
1800.0000 [IU]/h | INTRAVENOUS | Status: DC
  Administered 2023-03-04: 1800 [IU]/h via INTRAVENOUS
  Filled 2023-03-04: qty 250

## 2023-03-04 MED ORDER — VANCOMYCIN VARIABLE DOSE PER UNSTABLE RENAL FUNCTION (PHARMACIST DOSING): Status: DC

## 2023-03-04 MED ORDER — INSULIN ASPART 100 UNIT/ML IJ SOLN
0.0000 [IU] | INTRAMUSCULAR | Status: DC
  Administered 2023-03-04: 2 [IU] via SUBCUTANEOUS
  Administered 2023-03-04: 1 [IU] via SUBCUTANEOUS
  Administered 2023-03-04: 2 [IU] via SUBCUTANEOUS
  Administered 2023-03-04: 3 [IU] via SUBCUTANEOUS
  Administered 2023-03-04: 1 [IU] via SUBCUTANEOUS
  Administered 2023-03-05: 3 [IU] via SUBCUTANEOUS
  Filled 2023-03-04 (×5): qty 1

## 2023-03-04 MED ORDER — NOREPINEPHRINE 4 MG/250ML-% IV SOLN: 2.0000 ug/min | INTRAVENOUS | Status: DC

## 2023-03-04 MED ORDER — VANCOMYCIN HCL IN DEXTROSE 1-5 GM/200ML-% IV SOLN
1000.0000 mg | Freq: Once | INTRAVENOUS | Status: DC
Start: 2023-03-04 — End: 2023-03-04
  Filled 2023-03-04: qty 200

## 2023-03-04 MED ORDER — SODIUM CHLORIDE 0.9 % IV SOLN
2.0000 g | Freq: Two times a day (BID) | INTRAVENOUS | Status: DC
  Administered 2023-03-04 – 2023-03-05 (×3): 2 g via INTRAVENOUS
  Filled 2023-03-04 (×4): qty 12.5

## 2023-03-04 MED ORDER — CEFEPIME HCL 2 G IV SOLR
2.0000 g | Freq: Once | INTRAVENOUS | Status: DC
  Filled 2023-03-04: qty 12.5

## 2023-03-04 MED ORDER — CHLORHEXIDINE GLUCONATE CLOTH 2 % EX PADS
6.0000 | MEDICATED_PAD | Freq: Every day | CUTANEOUS | Status: DC
  Administered 2023-03-05 – 2023-03-11 (×6): 6 via TOPICAL

## 2023-03-04 MED ORDER — ENOXAPARIN SODIUM 100 MG/ML IJ SOSY
90.0000 mg | PREFILLED_SYRINGE | INTRAMUSCULAR | Status: DC
  Administered 2023-03-04 – 2023-03-12 (×9): 90 mg via SUBCUTANEOUS
  Filled 2023-03-04 (×11): qty 1

## 2023-03-04 MED ORDER — DOCUSATE SODIUM 100 MG PO CAPS
100.0000 mg | ORAL_CAPSULE | Freq: Two times a day (BID) | ORAL | Status: DC | PRN
Start: 2023-03-04 — End: 2023-03-13

## 2023-03-04 MED ORDER — SODIUM CHLORIDE 0.9 % IV SOLN
250.0000 mL | INTRAVENOUS | Status: AC
  Administered 2023-03-04: 250 mL via INTRAVENOUS

## 2023-03-04 NOTE — Consult Note (Signed)
PHARMACY CONSULT NOTE - FOLLOW UP  Pharmacy Consult for Electrolyte Monitoring and Replacement   Recent Labs: Potassium (mmol/L)  Date Value  03/04/2023 4.0  05/02/2011 4.2   Magnesium (mg/dL)  Date Value  14/78/2956 2.4   Calcium (mg/dL)  Date Value  21/30/8657 7.7 (L)   Calcium, Total (mg/dL)  Date Value  84/69/6295 8.8   Albumin (g/dL)  Date Value  28/41/3244 3.2 (L)  03/08/2011 3.9   Phosphorus (mg/dL)  Date Value  02/02/7251 7.1 (H)   Sodium (mmol/L)  Date Value  03/04/2023 136  05/02/2011 142     Assessment: 58 y.o African American Male with significant PMH of morbid obesity, chronic  HFpEF, HTN, OSA on CPAP, CAD, T2DM, and recent CVA on 02/14/2023 who presented to the ED with sepsis.   Goal of Therapy:  WNL  Plan:  No replacement needed at this time.  F/u with AM labs.   Ronnald Ramp ,PharmD Clinical Pharmacist 03/04/2023 11:43 AM

## 2023-03-04 NOTE — Progress Notes (Addendum)
Pharmacy Antibiotic Note  Raymond [name] is a 58 y.o. male admitted on 03/03/2023 with sepsis, ACS/NSTEMI.  Pharmacy has been consulted for Vancomycin, Cefepime dosing.  Pt is AKI,  SrCr ~ 3 X baseline.  Will dose Vanc by levels until renal function is stable  Plan: Cefepime 2 gm IV Q12H ordered to start on 2/1 @ ~ 0200.   Vancomycin 2500 mg IV X 1 ordered for 2/1 @ ~ 0200. - pt is AKI, will dose by levels until renal function stable  - will draw Vanc level 24 hrs after loading dose on 2/02 @ 0200.   Goal trough : 15 - 20 mcg/mL   Height: 6\' 2"  (188 cm) Weight: (!) 187.2 kg (412 lb 12.8 oz) IBW/kg (Calculated) : 82.2  Temp (24hrs), Avg:97.3 F (36.3 C), Min:96.4 F (35.8 C), Max:98.2 F (36.8 C)  Recent Labs  Lab 02/26/23 0521 03/03/23 1915 03/03/23 2245  WBC 9.0 13.1*  --   CREATININE 1.30* 3.44*  --   LATICACIDVEN  --  2.4* 2.0*    Estimated Creatinine Clearance: 40.6 mL/min (A) (by C-G formula based on SCr of 3.44 mg/dL (H)).    No Known Allergies  Antimicrobials this admission:   >>    >>   Dose adjustments this admission:   Microbiology results:  BCx:   UCx:    Sputum:    MRSA PCR:   Thank you for allowing pharmacy to be a part of this patient's care.  Shiryl Ruddy D 03/04/2023 2:04 AM

## 2023-03-04 NOTE — Consult Note (Signed)
NEUROLOGY CONSULT NOTE   Date of service: March 04, 2023 Patient Name: Raymond [name] [facility] MRN:  [id number] DOB:  February 15, 1965 Chief Complaint: "Speech difficulty, out of home oxygen, reduced urine output" Requesting Provider: Vida Rigger, MD  History of Present Illness  Davine Coba Utley is a 58 y.o. male with a past medical history significant for recent right posterior frontal lobe stroke (02/14/2023), BMI 52.93, hypertension, diabetes, congestive heart failure, peripheral vascular disease with bilateral toe amputations, hard of hearing at baseline, neuropathy, 4 L of oxygen use at baseline with chronic hypoxic/hypercapnic respiratory failure secondary to heart failure and obstructive sleep apnea on CPAP  He presented 1/31, was found to have EKG concerning for STEMI for which he had urgent cardiac catheterization without an obstructive lesion identified however mildly reduced left ventricular function with apical hypokinesis.  He was started on heparin drip in addition to aspirin and Plavix which had been started for stroke.  Also started on cefepime for concern for sepsis without clear source of infection identified at this time, noted to have AKI felt to be secondary to cardiogenic shock.  Neurology was consulted due to new punctate stroke on MRI brain  Regarding his recent stroke he presented on 1/14 in the morning complaining of left-sided weakness and dysarthria, did not receive TNK due to being out of the window with last known well of 10 PM the day before, no LVO, NIH stroke scale of 6 (1 for level of consciousness, 2 for questions, 2 for left arm drift, 1 for dysarthria) in the setting of shortness of breath, confusion and hypoxia to 75% with EMS.  On neurology follow-up on 1/15 he was speaking slowly with some delay in answering questions, moderate drift of the left upper extremity and drift of the bilateral lower extremities to the bed which was stated to be  chronic by patient, and patient reported being bedbound at baseline.  On clarification today his partner reports he has needed help with bathing secondary to his size, being unable to wash his back, but was otherwise ambulatory and able to manage his ADLs including getting to the bathroom and dressing himself without assistive devices.  However he did stop driving 3 to 4 years ago due to neuropathy and amputations of toes in his feet.  While he had not had a known history of stroke MRI demonstrated numerous chronic strokes.  TEE was not pursued due to his weight, poor functional status, heart failure with preserved EF, orthopnea and hypoxia on admission, but cardiac monitoring was recommended after discharge to evaluate for possible paroxysmal atrial fibrillation  He has made some recovery since his stroke but has remained weak on the left side and has not been able to start physical therapy.  PCP follow-up was planned for this coming Thursday.  He has not yet obtained an event monitor.  LKW: 1/30  Modified rankin score: 4-Needs assistance to walk and tend to bodily needs IV Thrombolysis: No, recent stroke EVT: No, exam not consistent with LVO   NIHSS components Score: Comment  1a Level of Conscious 0[x]  1[]  2[]  3[]      1b LOC Questions 0[]  1[x]  2[]     Reported the month is January  1c LOC Commands 0[x]  1[]  2[]       2 Best Gaze 0[x]  1[]  2[]       3 Visual 0[x]  1[]  2[]  3[]      4 Facial Palsy 0[x]  1[]  2[]  3[]      5a Motor Arm - left 0[]  1[x]  2[]   3[]  4[]  UN[]    5b Motor Arm - Right 0[x]  1[]  2[]  3[]  4[]  UN[]    6a Motor Leg - Left 0[]  1[x]  2[]  3[]  4[]  UN[]    6b Motor Leg - Right 0[]  1[x]  2[]  3[]  4[]  UN[]    7 Limb Ataxia 0[x]  1[]  2[]  3[]  UN[]     8 Sensory 0[x]  1[]  2[]  UN[]      9 Best Language 0[x]  1[]  2[]  3[]      10 Dysarthria 0[]  1[x]  2[]  UN[]      11 Extinct. and Inattention 0[x]  1[]  2[]       TOTAL: 5       ROS  Comprehensive ROS performed and pertinent positives documented in HPI, though  limited somewhat by the patient's continued sleepiness   Past History   Past Medical History:  Diagnosis Date   Arthritis    hands   Cellulitis    CHF (congestive heart failure) (HCC)    Chronic diastolic heart failure (HCC)    Chronic respiratory failure with hypoxia (HCC)    2 L [place] continuously   Diabetes mellitus type 2, insulin dependent (HCC)    Diabetic foot infection (HCC) 06/07/2022   Edema    FEET/LEGS   GERD (gastroesophageal reflux disease)    Hepatic steatosis    HOH (hard of hearing)    Hypertension    Morbid obesity (HCC)    Morbid obesity with BMI of 45.0-49.9, adult (HCC)    Neuropathy    Orthopnea    OSA on CPAP    TRILOGY VENTILATOR   Oxygen deficiency    2L  HS   PVD (peripheral vascular disease) (HCC)    Shortness of breath dyspnea    Systolic heart failure (HCC)    Preserved EF 50-55%    Past Surgical History:  Procedure Laterality Date   AMPUTATION Right 10/02/2014   Procedure: AMPUTATION RAY;  Surgeon: Recardo Evangelist, DPM;  Location: [facility] ORS;  Service: Podiatry;  Laterality: Right;   AMPUTATION TOE Right 06/09/2022   Procedure: AMPUTATION 2ND TOE RIGHT FOOT;  Surgeon: Candelaria Stagers, DPM;  Location: [facility] OR[facility];  Service: Podiatry;  Laterality: Right;   CATARACT EXTRACTION W/PHACO Right 09/10/2015   Procedure: CATARACT EXTRACTION PHACO AND INTRAOCULAR LENS PLACEMENT (IOC);  Surgeon: Galen Manila, MD;  Location: [facility] ORS;  Service: Ophthalmology;  Laterality: Right;  [place] 01:34 AP% 19.3 CDE 18.36 Fluid pack lot # [id number]   CATARACT EXTRACTION W/PHACO Left 10/06/2015   Procedure: CATARACT EXTRACTION PHACO AND INTRAOCULAR LENS PLACEMENT (IOC);  Surgeon: Galen Manila, MD;  Location: [facility] ORS;  Service: Ophthalmology;  Laterality: Left;  [place] 00:56 AP% 19.5 CDE 11.02 Fluid Pack # [id number]   CHOLECYSTECTOMY     COLONOSCOPY WITH PROPOFOL N/A 05/17/2016   Procedure: COLONOSCOPY WITH PROPOFOL;  Surgeon: Christena Deem, MD;  Location: [facility] ENDOSCOPY;   Service: Endoscopy;  Laterality: N/A;   COLONOSCOPY WITH PROPOFOL N/A 12/27/2021   Procedure: COLONOSCOPY WITH PROPOFOL;  Surgeon: Wyline Mood, MD;  Location: [facility] ENDOSCOPY;  Service: Gastroenterology;  Laterality: N/A;   EYE SURGERY Left    bilat laser   HIP SURGERY Right    8 th grade pins   LOWER EXTREMITY ANGIOGRAPHY Right 06/08/2022   Procedure: Lower Extremity Angiography;  Surgeon: Renford Dills, MD;  Location: [facility] INVASIVE CV LAB;  Service: Cardiovascular;  Laterality: Right;   NOSE SURGERY     PERIPHERAL VASCULAR CATHETERIZATION Right 10/02/2014   Procedure: Lower Extremity Angiography;  Surgeon: Annice Needy, MD;  Location: [facility] INVASIVE CV LAB;  Service: Cardiovascular;  Laterality: Right;   TOE AMPUTATION Left 2010   2cd   TOE AMPUTATION Right 2009    Family History: Family History  Problem Relation Age of Onset   Hypertension Mother    Diabetes Mother    Stroke Father    Hypertension Father    Heart attack Father     Social History  reports that he has never smoked. He has never used smokeless tobacco. He reports that he does not drink alcohol and does not use drugs.  No Known Allergies  Medications   Current Facility-Administered Medications:    0.9 %  sodium chloride infusion, 250 mL, Intravenous, Continuous, [place], Hubbard Hartshorn, NP, Paused at 03/04/23 0206   acetaminophen (TYLENOL) tablet 650 mg, 650 mg, Oral, Q4H PRN, Paraschos, Alexander, MD   aspirin chewable tablet 81 mg, 81 mg, Oral, Daily, Paraschos, Alexander, MD   ceFEPIme (MAXIPIME) 2 g in sodium chloride 0.9 % 100 mL IVPB, 2 g, Intravenous, Q12H, Vida Rigger, MD, Stopped at 03/04/23 0150   clopidogrel (PLAVIX) tablet 75 mg, 75 mg, Oral, Q breakfast, Paraschos, Alexander, MD   docusate sodium (COLACE) capsule 100 mg, 100 mg, Oral, BID PRN, Jimmye Norman, NP   heparin ADULT infusion 100 units/mL (25000 units/264mL), 1,800 Units/hr, Intravenous, Continuous, Karna Christmas, Fu[place], MD,  Stopped at 03/04/23 0300   insulin aspart (novoLOG) injection 0-9 Units, 0-9 Units, Subcutaneous, Q4H, Jimmye Norman, NP, 1 Units at 03/04/23 1610   norepinephrine (LEVOPHED) 4mg  in (0.016 mg/mL) premix infusion, 0-10 mcg/min, Intravenous, Titrated, Jimmye Norman, NP, Last Rate: 26.3 mL/hr at 03/04/23 0939, 7 mcg/min at 03/04/23 0939   ondansetron (ZOFRAN) injection 4 mg, 4 mg, Intravenous, Q6H PRN, Paraschos, Alexander, MD   oxyCODONE-acetaminophen (PERCOCET/ROXICET) 5-325 MG per tablet 1 tablet, 1 tablet, Oral, Q4H PRN, Lorretta Harp, MD   polyethylene glycol (MIRALAX / GLYCOLAX) packet 17 g, 17 g, Oral, Daily PRN, Jimmye Norman, NP   sodium chloride flush (NS) 0.9 % injection 3 mL, 3 mL, Intravenous, PRN, Paraschos, Alexander, MD   vancomycin variable dose per unstable renal function (pharmacist dosing), , Does not apply, See admin instructions, Vida Rigger, MD  Vitals   Current vital signs: BP (!) 104/58   Pulse 60   Temp (!) 97.2 F (36.2 C) (Axillary)   Resp 14   Ht 6\' 2"  (1.88 m)   Wt (!) 187 kg   SpO2 96%   BMI 52.93 kg/m  Vital signs in last 24 hours: Temp:  [96.4 F (35.8 C)-98.2 F (36.8 C)] 97.2 F (36.2 C) (02/01 0430) Pulse Rate:  [0-119] 60 (02/01 0645) Resp:  [0-31] 14 (02/01 0645) BP: (82-142)/(35-79) 104/58 (02/01 0645) SpO2:  [82 %-100 %] 96 % (02/01 0645) FiO2 (%):  [40 %-50 %] 50 % (01/31 2318) Weight:  [187 kg-187.2 kg] 187 kg (02/01 0500)  Vitals:   03/04/23 0600 03/04/23 0615 03/04/23 0630 03/04/23 0645  BP: (!) 105/56 (!) 100/52 (!) 102/52 (!) 104/58  Pulse: (!) 56 (!) 59 (!) 58 60  Resp: 13 10 16 14   Temp:      TempSrc:      SpO2: 94% 93% 94% 96%  Weight:      Height:        Body mass index is 52.93 kg/m.  Physical Exam   Constitutional: Appears chronically ill, acutely fatigued Psych: Affect flat, calm, cooperative Eyes: No scleral injection.  HENT: No OP obstruction.  Nasal cannula in place Head:  Normocephalic.  Cardiovascular:  Normal rate and regular rhythm On monitor Respiratory: Appears somewhat short of breath at rest Skin: Ulcerations of his legs described by nursing Musculoskeletal: Bilateral toe amputations  Neurologic Examination   MS: Somewhat sleepy, follows commands, able to name and repeat, oriented to age but not month (though it is 1st of February today, does not January), hazy on details of his presentation Speech: moderate dysarthria, speaks slowly with some delay in answering questions. CN: VFF, EOMI with saccadic pursuits, sensation intact, very subtle L facial droop (too mild to score on NIH), hearing intact to voice Motor: LUE slight asterixis, RUE no drift. L grip significantly weaker than R.  Initially lower extremities quickly drifted to the bed but with encouragement he was able to maintain both lower extremities antigravity with only slight drift bilaterally symmetric Sensory intact to light touch throughout Coordination: FNF intact bilaterally  Labs/Imaging/Neurodiagnostic studies   CBC:  Recent Labs  Lab 03/06/2023 1915 03/04/23 0547  WBC 13.1* 10.9*  NEUTROABS 9.6*  --   HGB 10.6* 10.1*  HCT 38.3* 36.4*  MCV 78.2* 77.4*  PLT 387 365   Basic Metabolic Panel:  Lab Results  Component Value Date   NA 136 03/04/2023   K 4.0 03/04/2023   CO2 25 03/04/2023   GLUCOSE 156 (H) 03/04/2023   BUN 47 (H) 03/04/2023   CREATININE 2.90 (H) 03/04/2023   CALCIUM 7.7 (L) 03/04/2023   GFRNONAA 24 (L) 03/04/2023   GFRAA >60 04/30/2017   Lipid Panel:  Lab Results  Component Value Date   CHOL 54 02/15/2023   HDL 38 (L) 02/15/2023   LDLCALC 12 02/15/2023   TRIG 20 02/15/2023   CHOLHDL 1.4 02/15/2023     HgbA1c:  Lab Results  Component Value Date   HGBA1C 6.7 (H) 02/14/2023   Urine Drug Screen:     Component Value Date/Time   LABOPIA NONE DETECTED 02/14/2023 1620   LABOPIA NONE DETECTED 04/28/2017 1230   COCAINSCRNUR NONE DETECTED 02/14/2023 1620    LABBENZ NONE DETECTED 02/14/2023 1620   LABBENZ NONE DETECTED 04/28/2017 1230   AMPHETMU NONE DETECTED 02/14/2023 1620   AMPHETMU NONE DETECTED 04/28/2017 1230   THCU NONE DETECTED 02/14/2023 1620   THCU NONE DETECTED 04/28/2017 1230   LABBARB NONE DETECTED 02/14/2023 1620   LABBARB NONE DETECTED 04/28/2017 1230    Alcohol Level     Component Value Date/Time   ETH <10 02/14/2023 0650   INR  Lab Results  Component Value Date   INR 1.1 02/14/2023   APTT  Lab Results  Component Value Date   APTT 30 02/14/2023    CT Head without contrast Mar 06, 2023 (Personally reviewed): 1. Evolving right frontal infarct. No evidence of new/interval acute abnormality; however, MRI could provide more sensitive evaluation for new peri-infarct ischemia if clinically warranted. 2. Remote cerebellar and left parietal infarcts.  CT angio Head and Neck with contrast 1/14 personally reviewed, agree with radiology:   1. No emergent finding. 2. Minimal calcified atherosclerosis. 3. Suboptimal bolus density and soft tissue attenuation limits visualization of arteries in the neck, especially the proximal vertebral arteries.  MRI Brain (personally reviewed, agree with radiology): *Punctate acute infarct in the left occipital cortex since MRI 2 weeks prior. *Expected evolution of subacute right frontal infarct. 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  Cardiac catheterization 2023-03-06  1.  Normal coronary anatomy 2.  Mildly reduced left ventricular function with estimated LV ejection fraction 45% with apical hypokinesis 3.  Acute renal failure 4.  Recent  CVA  ECHO 02/15/2023  1. Technically difficult study. Definity used to optimize the study.   2. Left ventricular ejection fraction, by estimation, is 55 to 60%. The  left ventricle has normal function. The left ventricle has no regional  wall motion abnormalities. There is moderate concentric left ventricular  hypertrophy. Left  ventricular  diastolic parameters are indeterminate.   3. Right ventricular systolic function was not well visualized. The right  ventricular size is not well visualized. There is normal pulmonary artery  systolic pressure.   4. The mitral valve is normal in structure. Trivial mitral valve  regurgitation.   5. The aortic valve is tricuspid. Aortic valve regurgitation is not  visualized.  [Bilateral atria not well visualized]  ASSESSMENT   Deanthony Maull Hirata is a 58 y.o. left-handed man with multiple vascular risk factors including hypertension, diabetes, peripheral vascular disease, sleep apnea, BMI 52.93, heart failure  Presenting with recurrent hypoxia in the setting of running out of his home oxygen as well as EKG changes with no obstructive lesion identified on cardiac catheterization, course complicated by acute kidney injury  RECOMMENDATIONS   -Hold aspirin and Plavix while on therapeutic anticoagulation from a neurology perspective but defer to cardiology if antiplatelet agent needs to be reordered due to cardiac concerns (patient did not receive morning doses due to being n.p.o.); if anticoagulation is stopped please resume dual antiplatelet therapy for previously planned course (though 2/4) and then transition to aspirin monotherapy lifelong -Given limited quality of his prior echocardiogram, and apical hypokinesis, agree with repeat echocardiogram.  Pending echocardiogram results, defer risk/benefit of TEE to cardiology -Given LDL is quite low at 12 and very low LDL can be associated with increased risk of ICH would not start a statin at this time, unless strongly felt to be needed by cardiology -Cardiac event monitor at minimum, please apply prior to patient discharge; if possible would pursue loop recorder implantation inpatient given his significant cardiac comorbidities -Please note risk of significant confusion due to neurotoxicity of cefepime, especially in the setting  of AKI.  Appreciate narrowing of antibiotics per CCM as able -Inpatient neurology will sign off at this time, but please reach out if additional questions or concerns arise -Discussed with cardiology face-to-face (Dr. Darrold Junker) and with Dr. Karna Christmas via secure chat ______________________________________________________________________   Brooke Dare MD-PhD [facility] [phone] [facility] coverage for [facility] is from 8 AM to 4 AM in-house and 4 PM to 8 PM by telephone/video. 8 PM to 8 AM emergent questions or overnight urgent questions should be addressed to Teleneurology On-call or [facility] neurohospitalist; contact information can be found on AMION

## 2023-03-04 NOTE — Plan of Care (Signed)
  Problem: Education: Goal: Knowledge of General Education information will improve Description: Including pain rating scale, medication(s)/side effects and non-pharmacologic comfort measures Outcome: Progressing   Problem: Health Behavior/Discharge Planning: Goal: Ability to manage health-related needs will improve Outcome: Progressing   Problem: Clinical Measurements: Goal: Ability to maintain clinical measurements within normal limits will improve Outcome: Progressing Goal: Will remain free from infection Outcome: Progressing Goal: Diagnostic test results will improve Outcome: Progressing Goal: Respiratory complications will improve Outcome: Progressing Goal: Cardiovascular complication will be avoided Outcome: Progressing   Problem: Activity: Goal: Risk for activity intolerance will decrease Outcome: Progressing   Problem: Nutrition: Goal: Adequate nutrition will be maintained Outcome: Progressing   Problem: Coping: Goal: Level of anxiety will decrease Outcome: Progressing   Problem: Elimination: Goal: Will not experience complications related to bowel motility Outcome: Progressing Goal: Will not experience complications related to urinary retention Outcome: Progressing   Problem: Pain Managment: Goal: General experience of comfort will improve and/or be controlled Outcome: Progressing   Problem: Safety: Goal: Ability to remain free from injury will improve Outcome: Progressing   Problem: Skin Integrity: Goal: Risk for impaired skin integrity will decrease Outcome: Progressing   Problem: Education: Goal: Ability to describe self-care measures that may prevent or decrease complications (Diabetes Survival Skills Education) will improve Outcome: Progressing Goal: Individualized Educational Video(s) Outcome: Progressing   Problem: Coping: Goal: Ability to adjust to condition or change in health will improve Outcome: Progressing   Problem: Fluid  Volume: Goal: Ability to maintain a balanced intake and output will improve Outcome: Progressing   Problem: Health Behavior/Discharge Planning: Goal: Ability to identify and utilize available resources and services will improve Outcome: Progressing Goal: Ability to manage health-related needs will improve Outcome: Progressing   Problem: Metabolic: Goal: Ability to maintain appropriate glucose levels will improve Outcome: Progressing   Problem: Nutritional: Goal: Maintenance of adequate nutrition will improve Outcome: Progressing Goal: Progress toward achieving an optimal weight will improve Outcome: Progressing   Problem: Skin Integrity: Goal: Risk for impaired skin integrity will decrease Outcome: Progressing   Problem: Tissue Perfusion: Goal: Adequacy of tissue perfusion will improve Outcome: Progressing   Problem: Education: Goal: Understanding of CV disease, CV risk reduction, and recovery process will improve Outcome: Progressing Goal: Individualized Educational Video(s) Outcome: Progressing   Problem: Activity: Goal: Ability to return to baseline activity level will improve Outcome: Progressing   Problem: Cardiovascular: Goal: Ability to achieve and maintain adequate cardiovascular perfusion will improve Outcome: Progressing Goal: Vascular access site(s) Level 0-1 will be maintained Outcome: Progressing   Problem: Health Behavior/Discharge Planning: Goal: Ability to safely manage health-related needs after discharge will improve Outcome: Progressing   Problem: Clinical Measurements: Goal: Ability to avoid or minimize complications of infection will improve Outcome: Progressing   Problem: Skin Integrity: Goal: Skin integrity will improve Outcome: Progressing

## 2023-03-04 NOTE — Progress Notes (Signed)
NAME:  Raymond [name], MRN:  [id number], DOB:  18-May-1965, LOS: 1 ADMISSION DATE:  03/03/2023, CONSULTATION DATE:  03/03/23 REFERRING MD:  Shirlean Kelly  CHIEF COMPLAINT:  slurred speech , hypotension    HPI  58 y.o African American Male with significant PMH of morbid obesity, chronic  HFpEF, HTN, OSA on CPAP, CAD, T2DM, and recent CVA on 02/14/2023 who presented to the ED with chief complaints of possible infectious process.  On review of chart, patient was recently hospitalized on 02/14/2023 with CVA deemed not a candidate for thrombolytics due to being outside of the window period. He was treated with DuoNeb and discharged on 1/27.  He was sent to the ED today by family who noted he had not been eating, drinking, or urinating well. He reported that he ran out of his home oxygen.  He was also noted with slurred speech and hypotension, per family LKNW was 7 PM.  On EMS arrival patient was hypotensive so was given 500 cc bolus.  Initial EKG was concerning for potential STEMI however patient had no chest pain or shortness of breath   ED Course: Initial vital signs showed HR of 69 beats/minute, BP 97/72mm Hg, the RR 20 breaths/minute, and the oxygen saturation 91% on 4L [place] and a temperature of 98.55F (36.8C).  Ischial EKG in the ED was concerning for inferior STEMI.  Due to significantly elevated troponin and abnormal EKG, code STEMI was activated and patient taken to Cath Lab for emergent cath.  Pertinent Labs/Diagnostics Findings: Na+/ K+: 135/4.1 Glucose: 157 BUN/Cr.:45/3.44  WBC: 13.1 K/L  PCT: 0.20 Lactic acid: 2.4 COVID PCR: Negative,  troponin: 1575  BNP:106.6   CXR> CTH> CTA Chest> CT Abd/pelvis> Medication administered in the WU:JWJX Disposition:ICU  Past Medical History  morbid obesity, chronic  HFpEF, HTN, OSA on CPAP, CAD, T2DM, and recent CVA on 02/14/2023  Significant Hospital Events   01/31:Admit with STEMI s/p LHC, Acute on Chronic Hypoxic Hypercapnic Respiratory  Failure   Consults:  Cardiology Nephrology  Procedures:  01/31: Left heart cath  Significant Diagnostic Tests:  01/31: Chest Xray> IMPRESSION: Cardiomegaly with pulmonary vascular congestion. 01/31: Noncontrast CT head> IMPRESSION: 1. Evolving right frontal infarct. No evidence of new/interval acute abnormality; however, MRI could provide more sensitive evaluation for new peri-infarct ischemia if clinically warranted. 2. Remote cerebellar and left parietal infarcts. 01/31: CT Renal > IMPRESSION: 1. No acute intra-abdominal pathology identified. No definite radiographic explanation for the patient's reported symptoms. 2. Asymmetric subcutaneous edema and dermal thickening involving the: Left heart cath pannus which may relate to passive a though a superficial inflammatory process such as cellulitis, could appear similarly. 3. Bilateral hip effusions are suspected, well assessed on this examination. 4. Mild visceral arteriosclerosis.   Micro Data:  01/31: SARS-CoV-2 PCR> negative 01/31: Influenza PCR> negative 01/31: Blood culture x2> 01/31: MRSA PCR>> nonreactive  Antimicrobials:  1/31 Vancomycin>> 1/31 Cefepime>>  OBJECTIVE  Blood pressure (!) 117/55, pulse 74, temperature (!) 97.2 F (36.2 C), temperature source Axillary, resp. rate 19, height 6\' 2"  (1.88 m), weight (!) 187 kg, SpO2 93%.    FiO2 (%):  [40 %-50 %] 50 %   Intake/Output Summary (Last 24 hours) at 03/04/2023 1839 Last data filed at 03/04/2023 1800 Gross per 24 hour  Intake 3381.27 ml  Output 2375 ml  Net 1006.27 ml   Filed Weights   03/03/23 1911 03/04/23 0500  Weight: (!) 187.2 kg (!) 187 kg     Physical Examination  GENERAL:58 year-old morbidly  obese critically ill patient lying in the bed altered EYES: PEERLA. No scleral icterus. Extraocular muscles intact.  HEENT: Head atraumatic, normocephalic. Oropharynx and nasopharynx clear.  NECK:  No JVD, supple  LUNGS: Decreased breath sounds  bilaterally.  Mild use of accessory muscles of respiration.  CARDIOVASCULAR: S1, S2 normal. No murmurs, rubs, or gallops.  ABDOMEN: Soft, NTND EXTREMITIES: 2+ pitting edema of bilateral lower extremity. Bilateral toes amputations. Capillary refill >3 seconds in all extremities. Pulses palpable distally. NEUROLOGIC: The patient is altered. No focal neurological deficit appreciated. Cranial nerves are intact.  SKIN: No obvious rash, lesion, or ulcer. Warm to touch Labs/imaging that I havepersonally reviewed  (right click and "Reselect all SmartList Selections" daily)     Labs   CBC: Recent Labs  Lab 02/26/23 0521 03/03/23 1915 03/04/23 0547  WBC 9.0 13.1* 10.9*  NEUTROABS  --  9.6*  --   HGB 9.9* 10.6* 10.1*  HCT 35.0* 38.3* 36.4*  MCV 76.3* 78.2* 77.4*  PLT 323 387 365    Basic Metabolic Panel: Recent Labs  Lab 02/26/23 0521 03/03/23 1915 03/04/23 0547  NA 138 135 136  K 4.0 4.1 4.0  CL 99 96* 99  CO2 30 25 25   GLUCOSE 179* 157* 156*  BUN 42* 45* 47*  CREATININE 1.30* 3.44* 2.90*  CALCIUM 8.4* 8.2* 7.7*  MG  --   --  2.4  PHOS  --   --  7.1*   GFR: Estimated Creatinine Clearance: 48.1 mL/min (A) (by C-G formula based on SCr of 2.9 mg/dL (H)). Recent Labs  Lab 02/26/23 0521 03/03/23 1915 03/03/23 2245 03/04/23 0547  PROCALCITON  --  0.20  --   --   WBC 9.0 13.1*  --  10.9*  LATICACIDVEN  --  2.4* 2.0*  --     Liver Function Tests: Recent Labs  Lab 03/03/23 1915  AST 15  ALT 19  ALKPHOS 55  BILITOT 0.6  PROT 6.8  ALBUMIN 3.2*   No results for input(s): "LIPASE", "AMYLASE" in the last 168 hours. No results for input(s): "AMMONIA" in the last 168 hours.  ABG    Component Value Date/Time   PHART 7.27 (L) 03/03/2023 2258   PCO2ART 62 (H) 03/03/2023 2258   PO2ART 97 03/03/2023 2258   HCO3 28.5 (H) 03/03/2023 2258   O2SAT 97.9 03/03/2023 2258     Coagulation Profile: No results for input(s): "INR", "PROTIME" in the last 168 hours.  Cardiac  Enzymes: No results for input(s): "CKTOTAL", "CKMB", "CKMBINDEX", "TROPONINI" in the last 168 hours.  HbA1C: Hemoglobin A1C  Date/Time Value Ref Range Status  03/09/2011 03:43 AM 8.1 (H) 4.2 - 6.3 % Final    Comment:    The American Diabetes Association recommends that a primary goal of therapy should be <7% and that physicians should reevaluate the treatment regimen in patients with HbA1c values consistently >8%.    Hgb A1c MFr Bld  Date/Time Value Ref Range Status  02/14/2023 01:50 PM 6.7 (H) 4.8 - 5.6 % Final    Comment:    (NOTE) Pre diabetes:          5.7%-6.4%  Diabetes:              >6.4%  Glycemic control for   <7.0% adults with diabetes   06/07/2022 01:34 PM 7.4 (H) 4.8 - 5.6 % Final    Comment:    (NOTE) Pre diabetes:          5.7%-6.4%  Diabetes:              >  6.4%  Glycemic control for   <7.0% adults with diabetes     CBG: Recent Labs  Lab 03/03/23 2229 03/04/23 0629 03/04/23 0802 03/04/23 1125 03/04/23 1626  GLUCAP 168* 132* 147* 128* 168*    Review of Systems:   Unable to be obtained secondary to the patient's altered mental status.   Past Medical History  He,  has a past medical history of Arthritis, Cellulitis, CHF (congestive heart failure) (HCC), Chronic diastolic heart failure (HCC), Chronic respiratory failure with hypoxia (HCC), Diabetes mellitus type 2, insulin dependent (HCC), Diabetic foot infection (HCC) (06/07/2022), Edema, GERD (gastroesophageal reflux disease), Hepatic steatosis, HOH (hard of hearing), Hypertension, Morbid obesity (HCC), Morbid obesity with BMI of 45.0-49.9, adult (HCC), Neuropathy, Orthopnea, OSA on CPAP, Oxygen deficiency, PVD (peripheral vascular disease) (HCC), Shortness of breath dyspnea, and Systolic heart failure (HCC).   Surgical History    Past Surgical History:  Procedure Laterality Date   AMPUTATION Right 10/02/2014   Procedure: AMPUTATION RAY;  Surgeon: Recardo Evangelist, DPM;  Location: [facility] ORS;   Service: Podiatry;  Laterality: Right;   AMPUTATION TOE Right 06/09/2022   Procedure: AMPUTATION 2ND TOE RIGHT FOOT;  Surgeon: Candelaria Stagers, DPM;  Location: [facility] ORS;  Service: Podiatry;  Laterality: Right;   CATARACT EXTRACTION W/PHACO Right 09/10/2015   Procedure: CATARACT EXTRACTION PHACO AND INTRAOCULAR LENS PLACEMENT (IOC);  Surgeon: Galen Manila, MD;  Location: [facility] ORS;  Service: Ophthalmology;  Laterality: Right;  [place] 01:34 AP% 19.3 CDE 18.36 Fluid pack lot # [id number]   CATARACT EXTRACTION W/PHACO Left 10/06/2015   Procedure: CATARACT EXTRACTION PHACO AND INTRAOCULAR LENS PLACEMENT (IOC);  Surgeon: Galen Manila, MD;  Location: [facility] ORS;  Service: Ophthalmology;  Laterality: Left;  [place] 00:56 AP% 19.5 CDE 11.02 Fluid Pack # [id number]   CHOLECYSTECTOMY     COLONOSCOPY WITH PROPOFOL N/A 05/17/2016   Procedure: COLONOSCOPY WITH PROPOFOL;  Surgeon: Christena Deem, MD;  Location: [facility] END[facility]COPY;  Service: Endoscopy;  Laterality: N/A;   COLONOSCOPY WITH PROPOFOL N/A 12/27/2021   Procedure: COLONOSCOPY WITH PROPOFOL;  Surgeon: Wyline Mood, MD;  Location: [facility] ENDOSCOPY;  Service: Gastroenterology;  Laterality: N/A;   EYE SURGERY Left    bilat laser   HIP SURGERY Right    8 th grade pins   LOWER EXTREMITY ANGIOGRAPHY Right 06/08/2022   Procedure: Lower Extremity Angiography;  Surgeon: Renford Dills, MD;  Location: [facility] INVASIVE CV LAB;  Service: Cardiovascular;  Laterality: Right;   NOSE SURGERY     PERIPHERAL VASCULAR CATHETERIZATION Right 10/02/2014   Procedure: Lower Extremity Angiography;  Surgeon: Annice Needy, MD;  Location: [facility] INVASIVE CV LAB;  Service: Cardiovascular;  Laterality: Right;   TOE AMPUTATION Left 2010   2cd   TOE AMPUTATION Right 2009     Social History   reports that he has never smoked. He has never used smokeless tobacco. He reports that he does not drink alcohol and does not use drugs.   Family History   His family history includes Diabetes in his  mother; Heart attack in his father; Hypertension in his father and mother; Stroke in his father.   Allergies No Known Allergies   Home Medications  Prior to Admission medications   Medication Sig Start Date End Date Taking? Authorizing Provider  aspirin EC 81 MG tablet Take 81 mg by mouth daily.    [provider]  atorvastatin (LIPITOR) 40 MG tablet atorvastatin 40 mg tablet    [provider]  BYETTA 10 MCG PEN 10 MCG/0.04ML  SOPN injection Inject 10 mcg into the skin 2 (two) times daily with a meal. 12/26/22   [provider]  carvedilol (COREG) 25 MG tablet Take 1 tablet (25 mg total) by mouth 2 (two) times daily with a meal. 02/26/23   Arnetha Courser, MD  clopidogrel (PLAVIX) 75 MG tablet Take 1 tablet (75 mg total) by mouth daily for 21 days. 02/27/23 03/20/23  Arnetha Courser, MD  empagliflozin (JARDIANCE) 25 MG TABS tablet Take by mouth daily. Once daily in the am    [provider]  Fe Fum-Vit C-Vit B12-FA (TRIGELS-F FORTE) CAPS capsule Take 1 capsule by mouth 2 (two) times daily. 02/26/23   Arnetha Courser, MD  gabapentin (NEURONTIN) 300 MG capsule Take 300 mg by mouth 3 (three) times daily.    [provider]  insulin aspart (NOVOLOG) 100 UNIT/ML injection Inject into the skin 3 (three) times daily before meals.    [provider]  isosorbide mononitrate (IMDUR) 60 MG 24 hr tablet Take 1 tablet (60 mg total) by mouth at bedtime. 02/26/23   Arnetha Courser, MD  LANTUS 100 UNIT/ML injection Inject 90 Units into the skin 2 (two) times daily. 04/28/22   [provider]  metFORMIN (GLUCOPHAGE) 1000 MG tablet Take 1,000 mg by mouth 2 (two) times daily with a meal.    [provider]  Multiple Vitamins-Minerals (CENTRUM SILVER 50+MEN) TABS Take 1 tablet by mouth daily.    [provider]  OXYGEN Inhale 2 L into the lungs daily.    [provider]  pantoprazole (PROTONIX) 40 MG tablet Take 40 mg by mouth daily.     [provider]  polyethylene glycol (MIRALAX / GLYCOLAX) 17 g packet Take 17 g by mouth daily as needed for mild constipation. 02/26/23   Arnetha Courser, MD  tamsulosin (FLOMAX) 0.4 MG CAPS capsule Take 1 capsule (0.4 mg total) by mouth daily after supper. 02/26/23   Arnetha Courser, MD  torsemide (DEMADEX) 20 MG tablet Take 2 tablets (40 mg total) by mouth daily. 05/01/17   Rai, Delene Ruffini, MD  TRUEPLUS INSULIN SYRINGE 29G X 1/2" 1 ML MISC  02/16/18   [provider]  TRUEPLUS PEN NEEDLES 31G X 8 MM MISC  02/06/18   [provider]  valsartan (DIOVAN) 160 MG tablet Take 160 mg by mouth daily. 07/11/22   [provider]  Scheduled Meds:  Chlorhexidine Gluconate Cloth  6 each Topical Daily   enoxaparin (LOVENOX) injection  90 mg Subcutaneous Q24H   insulin aspart  0-9 Units Subcutaneous Q4H   vancomycin variable dose per unstable renal function (pharmacist dosing)   Does not apply See admin instructions   Continuous Infusions:  sodium chloride Stopped (03/04/23 0206)   ceFEPime (MAXIPIME) IV Stopped (03/04/23 1448)   norepinephrine (LEVOPHED) Adult infusion 5 mcg/min (03/04/23 1800)   PRN Meds:.acetaminophen, docusate sodium, ondansetron (ZOFRAN) IV, oxyCODONE-acetaminophen, polyethylene glycol, sodium chloride flush   Active Hospital Problem list   See systems below  Assessment & Plan:  #Acute on Chronic Hypoxic Hypercapnic Respiratory Failure secondary to  #Acute on chronic systolic heart failure #OSA on CPAP -Supplemental O2 as needed to maintain O2 saturations 88 to 92% -BiPAP, wean as tolerated -High risk for intubation -Follow intermittent ABG and chest x-ray as needed -Hold Lasix in the setting of AKI and hypotension -As needed bronchodilators   #Inferior STEMI s/p LHC nonrevealing for obstructive CAD #HFpEF prior EF 50-55% with new reduced systolic function with LVEF 450% on 03/03/23 #Cardiogenic  Shock PMHx:  HLD  HTN -Trend troponin until  peaked -Start Heparin gtt -Aspirin 81 + Plavix 75 every day -For afterload reduction: Hold isosorbide -GDMT: Hold carvedilol,Jardiance, iso hypotension -Daily APTT, CBC -Lipitor 40mg  daily  -Cardiology following appreciate input   #Leukocytosis likely reactive in the setting of STEMI No obvious source of infection, chest x-ray neg, CT? Cellulitis and UA pending -F/u cultures, trend lactic/ PCT -Monitor WBC/ fever curve -continue empiric antibiotics for now pending cultures -Pressors for MAP goal >65 -Strict I/O's   #AKI  likely prerenal in the setting of shock as above -trend lactate -Monitor I&O's / urinary output -Follow BMP -Ensure adequate renal perfusion -Avoid nephrotoxic agents as able -Replace electrolytes as indicated -Nephrology consult as appropriate  #T2DM  Diabetic Neuropathy  Recent A1c 6.7.  -CBGs q4 -Sliding scale insulin -Follow ICU hyper/hypoglycemia protocol -Hold home Meds while on SSI  #Acute Metabolic Encephalopathy  Recent CVA -CTH -Obtain follow up MRI Brain -Provide supportive care -BiPAP to treat Hypercapnia     Best practice:  Diet:  NPO Pain/Anxiety/Delirium protocol (if indicated): No VAP protocol (if indicated): Not indicated DVT prophylaxis: Systemic AC GI prophylaxis: PPI Glucose control:  SSI Yes Central venous access:  N/A Arterial line:  N/A Foley:  Yes, and it is still needed Mobility:  bed rest  PT consulted: N/A Last date of multidisciplinary goals of care discussion [Updated sister at the bedside] Code Status:  full code Disposition: ICU   = Goals of Care = Code Status Order: FULL  Primary Emergency Contact: Dennis,Glenda Wishes to pursue full aggressive treatment and intervention options, including CPR and intubation, but goals of care will be addressed on going with family if that should become necessary.  Critical care provider statement:   Total critical care time: 33 minutes   Performed by: Karna Christmas MD    Critical care time was exclusive of separately billable procedures and treating other patients.   Critical care was necessary to treat or prevent imminent or life-threatening deterioration.   Critical care was time spent personally by me on the following activities: development of treatment plan with patient and/or surrogate as well as nursing, discussions with consultants, evaluation of patient's response to treatment, examination of patient, obtaining history from patient or surrogate, ordering and performing treatments and interventions, ordering and review of laboratory studies, ordering and review of radiographic studies, pulse oximetry and re-evaluation of patient's condition.    Vida Rigger, M.D.  Pulmonary & Critical Care Medicine

## 2023-03-04 NOTE — Progress Notes (Signed)
PHARMACY - ANTICOAGULATION CONSULT NOTE  Pharmacy Consult for Heparin  Indication: chest pain/ACS  No Known Allergies  Patient Measurements: Height: 6\' 2"  (188 cm) Weight: (!) 187.2 kg (412 lb 12.8 oz) IBW/kg (Calculated) : 82.2 Heparin Dosing Weight: 128.1 kg   Vital Signs: Temp: 96.4 F (35.8 C) (01/31 2300) Temp Source: Axillary (01/31 2300) BP: 126/56 (02/01 0115) Pulse Rate: 61 (02/01 0115)  Labs: Recent Labs    03/03/23 1915 03/03/23 2110 03/03/23 2245  HGB 10.6*  --   --   HCT 38.3*  --   --   PLT 387  --   --   CREATININE 3.44*  --   --   TROPONINIHS 1,575* 1,624* 1,495*    Estimated Creatinine Clearance: 40.6 mL/min (A) (by C-G formula based on SCr of 3.44 mg/dL (H)).   Medical History: Past Medical History:  Diagnosis Date   Arthritis    hands   Cellulitis    CHF (congestive heart failure) (HCC)    Chronic diastolic heart failure (HCC)    Chronic respiratory failure with hypoxia (HCC)    2 L [place] continuously   Diabetes mellitus type 2, insulin dependent (HCC)    Diabetic foot infection (HCC) 06/07/2022   Edema    FEET/LEGS   GERD (gastroesophageal reflux disease)    Hepatic steatosis    HOH (hard of hearing)    Hypertension    Morbid obesity (HCC)    Morbid obesity with BMI of 45.0-49.9, adult (HCC)    Neuropathy    Orthopnea    OSA on CPAP    TRILOGY VENTILATOR   Oxygen deficiency    2L  HS   PVD (peripheral vascular disease) (HCC)    Shortness of breath dyspnea    Systolic heart failure (HCC)    Preserved EF 50-55%    Medications:  Medications Prior to Admission  Medication Sig Dispense Refill Last Dose/Taking   aspirin EC 81 MG tablet Take 81 mg by mouth daily.      atorvastatin (LIPITOR) 40 MG tablet atorvastatin 40 mg tablet      BYETTA 10 MCG PEN 10 MCG/0.04ML SOPN injection Inject 10 mcg into the skin 2 (two) times daily with a meal.      carvedilol (COREG) 25 MG tablet Take 1 tablet (25 mg total) by mouth 2 (two) times daily  with a meal. 60 tablet 1    clopidogrel (PLAVIX) 75 MG tablet Take 1 tablet (75 mg total) by mouth daily for 21 days. 21 tablet 0    empagliflozin (JARDIANCE) 25 MG TABS tablet Take by mouth daily. Once daily in the am      Fe Fum-Vit C-Vit B12-FA (TRIGELS-F FORTE) CAPS capsule Take 1 capsule by mouth 2 (two) times daily. 120 capsule 2    gabapentin (NEURONTIN) 300 MG capsule Take 300 mg by mouth 3 (three) times daily.      insulin aspart (NOVOLOG) 100 UNIT/ML injection Inject into the skin 3 (three) times daily before meals.      isosorbide mononitrate (IMDUR) 60 MG 24 hr tablet Take 1 tablet (60 mg total) by mouth at bedtime. 30 tablet 1    LANTUS 100 UNIT/ML injection Inject 90 Units into the skin 2 (two) times daily.      metFORMIN (GLUCOPHAGE) 1000 MG tablet Take 1,000 mg by mouth 2 (two) times daily with a meal.      Multiple Vitamins-Minerals (CENTRUM SILVER 50+MEN) TABS Take 1 tablet by mouth daily.  OXYGEN Inhale 2 L into the lungs daily.      pantoprazole (PROTONIX) 40 MG tablet Take 40 mg by mouth daily.      polyethylene glycol (MIRALAX / GLYCOLAX) 17 g packet Take 17 g by mouth daily as needed for mild constipation. 14 each 0    tamsulosin (FLOMAX) 0.4 MG CAPS capsule Take 1 capsule (0.4 mg total) by mouth daily after supper. 30 capsule 2    torsemide (DEMADEX) 20 MG tablet Take 2 tablets (40 mg total) by mouth daily. 60 tablet 2    TRUEPLUS INSULIN SYRINGE 29G X 1/2" 1 ML MISC       TRUEPLUS PEN NEEDLES 31G X 8 MM MISC       valsartan (DIOVAN) 160 MG tablet Take 160 mg by mouth daily.       Assessment: Pharmacy consulted to dose heparin in this 58 year old male admitted with ACS/NSTEMI.  No prior anticoag noted. CrCl = 40.6 ml/min   Goal of Therapy:  Heparin level 0.3-0.7 units/ml Monitor platelets by anticoagulation protocol: Yes   Plan:  Pt received heparin 6000 units IV X 1 in Cath lab on 1/31 @ 2145.  Start heparin infusion at 1800 units/hr Check anti-Xa level in  6 hours and daily while on heparin Continue to monitor H&H and platelets  Porscha Axley D 03/04/2023,1:59 AM

## 2023-03-04 NOTE — Progress Notes (Signed)
[facility]  SUBJECTIVE: Patient laying in bed with BiPAP, denies chest pain   Vitals:   03/04/23 0600 03/04/23 0615 03/04/23 0630 03/04/23 0645  BP: (!) 105/56 (!) 100/52 (!) 102/52 (!) 104/58  Pulse: (!) 56 (!) 59 (!) 58 60  Resp: 13 10 16 14   Temp:      TempSrc:      SpO2: 94% 93% 94% 96%  Weight:      Height:         Intake/Output Summary (Last 24 hours) at 03/04/2023 1610 Last data filed at 03/04/2023 0700 Gross per 24 hour  Intake 2767.57 ml  Output 1375 ml  Net 1392.57 ml      PHYSICAL EXAM  General: Well developed, well nourished, in no acute distress HEENT:  Normocephalic and atramatic Neck:  No JVD.  Lungs: Clear bilaterally to auscultation and percussion. Heart: HRRR . Normal S1 and S2 without gallops or murmurs.  Abdomen: Bowel sounds are positive, abdomen soft and non-tender  Msk:  Back normal, normal gait. Normal strength and tone for age. Extremities: No clubbing, cyanosis or edema.   Neuro: Alert and oriented X 3. Psych:  Good affect, responds appropriately   LABS: Basic Metabolic Panel: Recent Labs    03/03/23 1915 03/04/23 0547  NA 135 136  K 4.1 4.0  CL 96* 99  CO2 25 25  GLUCOSE 157* 156*  BUN 45* 47*  CREATININE 3.44* 2.90*  CALCIUM 8.2* 7.7*  MG  --  2.4  PHOS  --  7.1*   Liver Function Tests: Recent Labs    03/03/23 1915  AST 15  ALT 19  ALKPHOS 55  BILITOT 0.6  PROT 6.8  ALBUMIN 3.2*   No results for input(s): "LIPASE", "AMYLASE" in the last 72 hours. CBC: Recent Labs    03/03/23 1915 03/04/23 0547  WBC 13.1* 10.9*  NEUTROABS 9.6*  --   HGB 10.6* 10.1*  HCT 38.3* 36.4*  MCV 78.2* 77.4*  PLT 387 365   Cardiac Enzymes: No results for input(s): "CKTOTAL", "CKMB", "CKMBINDEX", "TROPONINI" in the last 72 hours. BNP: Invalid input(s): "POCBNP" D-Dimer: No results for input(s): "DDIMER" in the last 72 hours. Hemoglobin A1C: No results for input(s): "HGBA1C" in the last 72 hours. Fasting Lipid Panel: No results  for input(s): "CHOL", "HDL", "LDLCALC", "TRIG", "CHOLHDL", "LDLDIRECT" in the last 72 hours. Thyroid Function Tests: No results for input(s): "TSH", "T4TOTAL", "T3FREE", "THYROIDAB" in the last 72 hours.  Invalid input(s): "FREET3" Anemia Panel: No results for input(s): "VITAMINB12", "FOLATE", "FERRITIN", "TIBC", "IRON", "RETICCTPCT" in the last 72 hours.  MR BRAIN WO CONTRAST Result Date: 03/04/2023 CLINICAL DATA:  Follow-up stroke EXAM: MRI HEAD WITHOUT CONTRAST TECHNIQUE: Multiplanar, multiecho pulse sequences of the brain and surrounding structures were obtained without intravenous contrast. COMPARISON:  Head CT from yesterday.  Brain MRI 02/14/2023 FINDINGS: Brain: Small acute infarct in the left occipital cortex since prior. Bleeding restricted diffusion in the right frontal cortex at site of subacute infarction. Some cortical laminar necrosis in this region by T1 weighted imaging. Pre-existing infarcts in the right cerebellum and left parietooccipital cortex. Diffusion hyperintensity in the midline related to dural ossification with artifact. No acute hemorrhage, hydrocephalus, or collection. Vascular: Major flow voids are preserved. Skull and upper cervical spine: Normal marrow signal. Sinuses/Orbits: Negative. IMPRESSION: *Punctate acute infarct in the left occipital cortex since MRI 2 weeks prior. *Expected evolution of subacute right frontal infarct. 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 Electronically Signed   By: Christiane Ha  Watts M.D.   On: 03/04/2023 07:12   CARDIAC CATHETERIZATION Result Date: 03/03/2023   There is mild left ventricular systolic dysfunction.   LV end diastolic pressure is mildly elevated.   The left ventricular ejection fraction is 45-50% by visual estimate. 1.  Normal coronary anatomy 2.  Mildly reduced left ventricular function with estimated LV ejection fraction 45% with apical hypokinesis 3.  Acute renal failure 4.  Recent CVA  Recommendations 1.  Medical therapy 2.  Continue aspirin and clopidogrel, following recent CVA 3.  Repeat 2D echocardiogram 4.  Nephrology consult   CT Renal Stone Study Result Date: 03/03/2023 CLINICAL DATA:  Abdominal/flank pain, stone suspected EXAM: CT ABDOMEN AND PELVIS WITHOUT CONTRAST TECHNIQUE: Multidetector CT imaging of the abdomen and pelvis was performed following the standard protocol without IV contrast. RADIATION DOSE REDUCTION: This exam was performed according to the departmental dose-optimization program which includes automated exposure control, adjustment of the mA and/or kV according to patient size and/or use of iterative reconstruction technique. COMPARISON:  08/06/2007 FINDINGS: Lower chest: No acute abnormality. Hepatobiliary: No focal liver abnormality is seen. Status post cholecystectomy. No biliary dilatation. Pancreas: Unremarkable Spleen: Unremarkable Adrenals/Urinary Tract: Adrenal glands are unremarkable. Kidneys are normal, without renal calculi, focal lesion, or hydronephrosis. Bladder is unremarkable. Stomach/Bowel: Stomach is within normal limits. Appendix appears normal. No evidence of bowel wall thickening, distention, or inflammatory changes. Vascular/Lymphatic: Mild visceral arteriosclerosis. The abdominopelvic vasculature is otherwise unremarkable in this noncontrast examination. No pathologic adenopathy within the abdomen and pelvis. Reproductive: Prostate is unremarkable. Other: There is asymmetric subcutaneous edema and dermal thickening involving the pannus which may relate to passive a though a superficial inflammatory process such as cellulitis, could appear similarly. Small fat containing umbilical hernia. Musculoskeletal: The osseous structures are age-appropriate. No acute bone abnormality. No lytic or blastic bone lesions are identified. Bilateral hip effusions are suspected, well assessed on this examination. IMPRESSION: 1. No acute intra-abdominal pathology  identified. No definite radiographic explanation for the patient's reported symptoms. 2. Asymmetric subcutaneous edema and dermal thickening involving the pannus which may relate to passive a though a superficial inflammatory process such as cellulitis, could appear similarly. 3. Bilateral hip effusions are suspected, well assessed on this examination. 4. Mild visceral arteriosclerosis. Electronically Signed   By: Helyn Numbers M.D.   On: 03/03/2023 20:37   CT HEAD WO CONTRAST ( ) Result Date: 03/03/2023 CLINICAL DATA:  Headache, new onset (Age >= 51y) EXAM: CT HEAD WITHOUT CONTRAST TECHNIQUE: Contiguous axial images were obtained from the base of the skull through the vertex without intravenous contrast. RADIATION DOSE REDUCTION: This exam was performed according to the departmental dose-optimization program which includes automated exposure control, adjustment of the mA and/or kV according to patient size and/or use of iterative reconstruction technique. COMPARISON:  MRI 02/14/2023. FINDINGS: Brain: Evolving right frontal infarct. No definite evidence of acute/interval large vascular territory infarct. Remote bilateral cerebellar infarcts and remote left parietal infarct. Vascular: No hyperdense vessel identified. Skull: No acute fracture. Sinuses/Orbits: Mild paranasal sinus mucosal thickening. IMPRESSION: 1. Evolving right frontal infarct. No evidence of new/interval acute abnormality; however, MRI could provide more sensitive evaluation for new peri-infarct ischemia if clinically warranted. 2. Remote cerebellar and left parietal infarcts. Electronically Signed   By: Feliberto Harts M.D.   On: 03/03/2023 20:20   DG Chest Portable 1 View Result Date: 03/03/2023 CLINICAL DATA:  Shortness of breath EXAM: PORTABLE CHEST 1 VIEW COMPARISON:  02/14/2023 FINDINGS: Similar cardiomegaly. Low lung volumes. Pulmonary vascular congestion. Bibasilar atelectasis. Otherwise  no focal consolidation. No definite pleural  effusion or pneumothorax. No pneumothorax. IMPRESSION: Cardiomegaly with pulmonary vascular congestion. Electronically Signed   By: Minerva Fester M.D.   On: 03/03/2023 20:14     Echo LVEF 55-60% 02/14/2023  TELEMETRY: Sinus rhythm 62 bpm:  ASSESSMENT AND PLAN:  Principal Problem:   STEMI (ST elevation myocardial infarction) (HCC) Active Problems:   Chronic diastolic heart failure (HCC)   HTN (hypertension)   AKI (acute kidney injury) (HCC)   Morbid obesity with BMI of 50.0-59.9, adult (HCC)   Essential hypertension   Type 2 diabetes mellitus without complications (HCC)   Iron deficiency anemia   COPD (chronic obstructive pulmonary disease) (HCC)   Stroke (HCC)   Slurred speech    1.  Type II MI, (1624, 1495, 1267) secondary to demand supply ischemia.  Atypical presentation, patient's chief complaint was inability to urinate, ECG concerning for inferolateral STEMI in the absence of chest pain, this show troponin elevated which prompted emergent cardiac catheterization, which did not reveal obstructive coronary artery disease, left ventriculogram revealed reduced left ventricular function with LVEF estimated to be 45-50% with apical hypokinesis.  Most recent 2D echocardiogram 02/14/2023 reportedly revealed normal left ventricular function, with LVEF 55-60% 2.  Acute kidney injury, BUN and creatinine 45 and 3.44 on admission, improved today 47 and 2.90 respectively 3.  Hypotension, unclear etiology, Levophed 4.  Recent CVA, MRI 02/14/2023 revealing acute cortical and subcortical infarction right posterior frontal lobe manifested with left-sided weakness and difficulty speaking  Recommendations  1.  Agree with current therapy 2.  Continue aspirin and clopidogrel status post recent CVA 3.  Repeat limited echo to evaluate left ventricular function with discrepancy between 2D echocardiogram 02/14/2023 and left ventriculogram 03/03/2023   Marcina Millard, MD, PhD, [facility] 03/04/2023 9:49  AM

## 2023-03-05 ENCOUNTER — Inpatient Hospital Stay
Admit: 2023-03-05 | Discharge: 2023-03-05 | Disposition: A | Discharge status: Home / Self Care | Payer: Medicare HMO | Attending: Cardiology | Admitting: Cardiology

## 2023-03-05 LAB — BASIC METABOLIC PANEL
Anion gap: 11 (ref 5–15)
BUN: 45 mg/dL — ABNORMAL HIGH (ref 6–20)
CO2: 26 mmol/L (ref 22–32)
Calcium: 8 mg/dL — ABNORMAL LOW (ref 8.9–10.3)
Chloride: 99 mmol/L (ref 98–111)
Creatinine, Ser: 1.98 mg/dL — ABNORMAL HIGH (ref 0.61–1.24)
GFR, Estimated: 38 mL/min — ABNORMAL LOW (ref >60)
Glucose, Bld: 209 mg/dL — ABNORMAL HIGH (ref 70–99)
Potassium: 4.3 mmol/L (ref 3.5–5.1)
Sodium: 136 mmol/L (ref 135–145)

## 2023-03-05 LAB — PHOSPHORUS: Phosphorus: 6.1 mg/dL — ABNORMAL HIGH (ref 2.5–4.6)

## 2023-03-05 LAB — ECHOCARDIOGRAM LIMITED
Height: 74 in
S' Lateral: 3.8 cm
Single Plane A4C EF: 64.2 %
Weight: 6596.16 [oz_av]

## 2023-03-05 LAB — CBC
HCT: 33.7 % — ABNORMAL LOW (ref 39.0–52.0)
Hemoglobin: 9.5 g/dL — ABNORMAL LOW (ref 13.0–17.0)
MCH: 21.3 pg — ABNORMAL LOW (ref 26.0–34.0)
MCHC: 28.2 g/dL — ABNORMAL LOW (ref 30.0–36.0)
MCV: 75.7 fL — ABNORMAL LOW (ref 80.0–100.0)
Platelets: 364 10*3/uL (ref 150–400)
RBC: 4.45 MIL/uL (ref 4.22–5.81)
RDW: 18.9 % — ABNORMAL HIGH (ref 11.5–15.5)
WBC: 9 10*3/uL (ref 4.0–10.5)
nRBC: 0 % (ref 0.0–0.2)

## 2023-03-05 LAB — VANCOMYCIN, TROUGH: Vancomycin Tr: 13 ug/mL — ABNORMAL LOW (ref 15–20)

## 2023-03-05 LAB — GLUCOSE, CAPILLARY
Glucose-Capillary: 190 mg/dL — ABNORMAL HIGH (ref 70–99)
Glucose-Capillary: 179 mg/dL — ABNORMAL HIGH (ref 70–99)
Glucose-Capillary: 156 mg/dL — ABNORMAL HIGH (ref 70–99)
Glucose-Capillary: 188 mg/dL — ABNORMAL HIGH (ref 70–99)
Glucose-Capillary: 208 mg/dL — ABNORMAL HIGH (ref 70–99)
Glucose-Capillary: 249 mg/dL — ABNORMAL HIGH (ref 70–99)
Glucose-Capillary: 259 mg/dL — ABNORMAL HIGH (ref 70–99)

## 2023-03-05 LAB — MAGNESIUM: Magnesium: 2.6 mg/dL — ABNORMAL HIGH (ref 1.7–2.4)

## 2023-03-05 MED ORDER — CLOPIDOGREL BISULFATE 75 MG PO TABS
75.0000 mg | ORAL_TABLET | Freq: Every day | ORAL | Status: DC
  Administered 2023-03-05 – 2023-03-13 (×9): 75 mg via ORAL
  Filled 2023-03-05 (×9): qty 1

## 2023-03-05 MED ORDER — VANCOMYCIN HCL 2000 MG/400ML IV SOLN
2000.0000 mg | INTRAVENOUS | Status: DC
  Administered 2023-03-05: 2000 mg via INTRAVENOUS
  Filled 2023-03-05: qty 400

## 2023-03-05 MED ORDER — INSULIN ASPART 100 UNIT/ML IJ SOLN
0.0000 [IU] | INTRAMUSCULAR | Status: DC
  Administered 2023-03-05 (×3): 4 [IU] via SUBCUTANEOUS
  Administered 2023-03-05 (×2): 7 [IU] via SUBCUTANEOUS
  Administered 2023-03-06: 4 [IU] via SUBCUTANEOUS
  Administered 2023-03-06: 7 [IU] via SUBCUTANEOUS
  Administered 2023-03-06: 4 [IU] via SUBCUTANEOUS
  Administered 2023-03-06: 7 [IU] via SUBCUTANEOUS
  Filled 2023-03-05 (×9): qty 1

## 2023-03-05 MED ORDER — ASPIRIN 81 MG PO TBEC
81.0000 mg | DELAYED_RELEASE_TABLET | Freq: Every day | ORAL | Status: DC
  Administered 2023-03-05 – 2023-03-13 (×9): 81 mg via ORAL
  Filled 2023-03-05 (×9): qty 1

## 2023-03-05 MED ORDER — CEFEPIME HCL 2 G IV SOLR
2.0000 g | Freq: Three times a day (TID) | INTRAVENOUS | Status: DC
  Administered 2023-03-05: 2 g via INTRAVENOUS
  Filled 2023-03-05 (×2): qty 12.5

## 2023-03-05 MED ORDER — ATORVASTATIN CALCIUM 20 MG PO TABS: 40.0000 mg | ORAL_TABLET | Freq: Every day | ORAL | Status: DC

## 2023-03-05 MED ORDER — PERFLUTREN LIPID MICROSPHERE
1.0000 mL | INTRAVENOUS | Status: AC | PRN
  Administered 2023-03-05: 6 mL via INTRAVENOUS

## 2023-03-05 NOTE — Consult Note (Signed)
PHARMACY CONSULT NOTE - FOLLOW UP  Pharmacy Consult for Electrolyte Monitoring and Replacement   Recent Labs: Potassium (mmol/L)  Date Value  03/05/2023 4.3  05/02/2011 4.2   Magnesium (mg/dL)  Date Value  14/78/2956 2.6 (H)   Calcium (mg/dL)  Date Value  21/30/8657 8.0 (L)   Calcium, Total (mg/dL)  Date Value  84/69/6295 8.8   Albumin (g/dL)  Date Value  28/41/3244 3.2 (L)  03/08/2011 3.9   Phosphorus (mg/dL)  Date Value  02/02/7251 6.1 (H)   Sodium (mmol/L)  Date Value  03/05/2023 136  05/02/2011 142     Assessment: 58 y.o African American Male with significant PMH of morbid obesity, chronic  HFpEF, HTN, OSA on CPAP, CAD, T2DM, and recent CVA on 02/14/2023 who presented to the ED with sepsis.   Goal of Therapy:  WNL  Plan:  Phos elevated but trending down. Pharmacy will sign off, as electrolytes stable. Please re-consult if needed.   Ronnald Ramp ,PharmD Clinical Pharmacist 03/05/2023 9:03 AM

## 2023-03-05 NOTE — Progress Notes (Signed)
Pharmacy Antibiotic Note  Raymond [name] is a 58 y.o. male admitted on 03/03/2023 with sepsis, ACS/NSTEMI.  Pharmacy has been consulted for Vancomycin, Cefepime dosing.  Pt is AKI,  SrCr ~ 3 X baseline.  Will dose Vanc by levels until renal function is stable  Plan: Cefepime 2 gm IV Q12H ordered to start on 2/1 @ ~ 0200.   Vancomycin 2500 mg IV X 1 ordered for 2/1 @ ~ 0200. - pt is AKI, will dose by levels until renal function stable  - will draw Vanc level 24 hrs after loading dose on 2/02 @ 0200.   Goal trough : 15 - 20 mcg/mL   2/2:  Vanc trough @ 0211 = 13 , SUBtherapeutic   - Pt renal function is approaching normal (SrCr 1.98, baseline is ~ 1.5).  Will use AUC dosing method from here on.  Vancomycin 2500 mg IV Q24H ordered to start on 2/2 @ ~ 0300.  AUC = 495.6 Vanc trough = 12.8  Height: 6\' 2"  (188 cm) Weight: (!) 187 kg (412 lb 4.2 oz) IBW/kg (Calculated) : 82.2  Temp (24hrs), Avg:97.9 F (36.6 C), Min:97.2 F (36.2 C), Max:98.6 F (37 C)  Recent Labs  Lab 02/26/23 0521 03/03/23 1915 03/03/23 2245 03/04/23 0547 03/05/23 0211  WBC 9.0 13.1*  --  10.9* 9.0  CREATININE 1.30* 3.44*  --  2.90* 1.98*  LATICACIDVEN  --  2.4* 2.0*  --   --   VANCOTROUGH  --   --   --   --  13*    Estimated Creatinine Clearance: 70.5 mL/min (A) (by C-G formula based on SCr of 1.98 mg/dL (H)).    No Known Allergies  Antimicrobials this admission:   >>    >>   Dose adjustments this admission:   Microbiology results:  BCx:   UCx:    Sputum:    MRSA PCR:   Thank you for allowing pharmacy to be a part of this patient's care.  Jeptha Hinnenkamp D 03/05/2023 3:15 AM

## 2023-03-05 NOTE — Plan of Care (Signed)
  Problem: Education: Goal: Knowledge of General Education information will improve Description: Including pain rating scale, medication(s)/side effects and non-pharmacologic comfort measures Outcome: Progressing   Problem: Clinical Measurements: Goal: Ability to maintain clinical measurements within normal limits will improve Outcome: Progressing Goal: Will remain free from infection Outcome: Progressing Goal: Diagnostic test results will improve Outcome: Progressing Goal: Cardiovascular complication will be avoided Outcome: Progressing   Problem: Nutrition: Goal: Adequate nutrition will be maintained Outcome: Progressing   Problem: Coping: Goal: Level of anxiety will decrease Outcome: Progressing   Problem: Elimination: Goal: Will not experience complications related to bowel motility Outcome: Progressing Goal: Will not experience complications related to urinary retention Outcome: Progressing   Problem: Pain Managment: Goal: General experience of comfort will improve and/or be controlled Outcome: Progressing   Problem: Safety: Goal: Ability to remain free from injury will improve Outcome: Progressing   Problem: Skin Integrity: Goal: Risk for impaired skin integrity will decrease Outcome: Progressing   Problem: Coping: Goal: Ability to adjust to condition or change in health will improve Outcome: Progressing   Problem: Fluid Volume: Goal: Ability to maintain a balanced intake and output will improve Outcome: Progressing   Problem: Health Behavior/Discharge Planning: Goal: Ability to identify and utilize available resources and services will improve Outcome: Progressing Goal: Ability to manage health-related needs will improve Outcome: Progressing   Problem: Metabolic: Goal: Ability to maintain appropriate glucose levels will improve Outcome: Progressing   Problem: Tissue Perfusion: Goal: Adequacy of tissue perfusion will improve Outcome: Progressing    Problem: Education: Goal: Understanding of CV disease, CV risk reduction, and recovery process will improve Outcome: Progressing   Problem: Activity: Goal: Ability to return to baseline activity level will improve Outcome: Progressing   Problem: Cardiovascular: Goal: Vascular access site(s) Level 0-1 will be maintained Outcome: Progressing   Problem: Skin Integrity: Goal: Skin integrity will improve Outcome: Progressing

## 2023-03-05 NOTE — Progress Notes (Signed)
  Echocardiogram 2D Echocardiogram has been performed. A Limited Echo was requested by cardiology and completed. Definity IV contrast imaging enhancing agent used on this study.  Raymond [name] 03/05/2023, 8:06 AM

## 2023-03-05 NOTE — Progress Notes (Signed)
[name], male   DOB: 06-30-1965, 57 y.o.   MRN: [id number] [facility]    SUBJECTIVE: Patient resting comfortably in bed no pain no fever still has some weakness no palpitations or tachycardia   Vitals:   03/05/23 1900 03/05/23 2000 03/05/23 2030 03/05/23 2100  BP: 107/62  (!) 103/50 127/63  Pulse: 65 74 67 78  Resp: 20 (!) 26 18 11   Temp:  98.5 F (36.9 C)    TempSrc:  Oral    SpO2: (!) 87% 96% 93% 95%  Weight:      Height:         Intake/Output Summary (Last 24 hours) at 03/05/2023 2154 Last data filed at 03/05/2023 2000 Gross per 24 hour  Intake 755.77 ml  Output 1850 ml  Net -1094.23 ml      PHYSICAL EXAM  General: Well developed, well nourished, in no acute distress HEENT:  Normocephalic and atramatic Neck:  No JVD.  Lungs: Clear bilaterally to auscultation and percussion. Heart: HRRR . Normal S1 and S2 without gallops or murmurs.  Abdomen: Bowel sounds are positive, abdomen soft and non-tender  Msk:  Back normal, normal gait. Normal strength and tone for age. Extremities: No clubbing, cyanosis or edema.   Neuro: Alert and oriented X 3. Psych:  Good affect, responds appropriately   LABS: Basic Metabolic Panel: Recent Labs    03/04/23 0547 03/05/23 0211  NA 136 136  K 4.0 4.3  CL 99 99  CO2 25 26  GLUCOSE 156* 209*  BUN 47* 45*  CREATININE 2.90* 1.98*  CALCIUM 7.7* 8.0*  MG 2.4 2.6*  PHOS 7.1* 6.1*   Liver Function Tests: Recent Labs    03/03/23 1915  AST 15  ALT 19  ALKPHOS 55  BILITOT 0.6  PROT 6.8  ALBUMIN 3.2*   No results for input(s): "LIPASE", "AMYLASE" in the last 72 hours. CBC: Recent Labs    03/03/23 1915 03/04/23 0547 03/05/23 0211  WBC 13.1* 10.9* 9.0  NEUTROABS 9.6*  --   --   HGB 10.6* 10.1* 9.5*  HCT 38.3* 36.4* 33.7*  MCV 78.2* 77.4* 75.7*  PLT 387 365 364   Cardiac Enzymes: No results for input(s): "CKTOTAL", "CKMB", "CKMBINDEX", "TROPONINI" in the last 72 hours. BNP: Invalid  input(s): "POCBNP" D-Dimer: No results for input(s): "DDIMER" in the last 72 hours. Hemoglobin A1C: No results for input(s): "HGBA1C" in the last 72 hours. Fasting Lipid Panel: No results for input(s): "CHOL", "HDL", "LDLCALC", "TRIG", "CHOLHDL", "LDLDIRECT" in the last 72 hours. Thyroid Function Tests: No results for input(s): "TSH", "T4TOTAL", "T3FREE", "THYROIDAB" in the last 72 hours.  Invalid input(s): "FREET3" Anemia Panel: No results for input(s): "VITAMINB12", "FOLATE", "FERRITIN", "TIBC", "IRON", "RETICCTPCT" in the last 72 hours.  ECHOCARDIOGRAM LIMITED Result Date: 03/05/2023    ECHOCARDIOGRAM LIMITED REPORT   Patient Name:   Raymond [name] [facility] Date of Exam: 03/05/2023 Medical Rec #:  [id number]                   Height:       74.0 in Accession #:    [id number]                  Weight:       412.3 lb Date of Birth:  1965-06-15                   BSA:          2.956 m Patient Age:  57 years                    BP:           119/55 mmHg Patient Gender: M                           HR:           60 bpm. Exam Location:  [facility] Procedure: Limited Echo and Intracardiac Opacification Agent Indications:     Acute myocardial infarction I21.9  History:         Patient has prior history of Echocardiogram examinations, most                  recent 02/15/2023.  Sonographer:     Overton Mam RDCS, FASE Referring Phys:  [id number] Lyn Hollingshead PARASCHOS Diagnosing Phys: Marcina Millard MD  Sonographer Comments: Technically challenging study due to limited acoustic windows, suboptimal apical window, no subcostal window and patient is obese. Image acquisition challenging due to patient body habitus. A Limited Echo was requested by Cardiology  to re-evaluate left ventricular function. IMPRESSIONS  1. Left ventricular ejection fraction, by estimation, is 55 to 60%. The left ventricle has normal function. The left ventricle has no regional wall motion abnormalities. FINDINGS  Left Ventricle: Left ventricular  ejection fraction, by estimation, is 55 to 60%. The left ventricle has normal function. The left ventricle has no regional wall motion abnormalities. Definity contrast agent was given IV to delineate the left ventricular  endocardial borders. LEFT VENTRICLE PLAX 2D LVIDd:         5.60 cm LVIDs:         3.80 cm LV PW:         1.30 cm LV IVS:        1.30 cm  LV Volumes (MOD) LV vol d, MOD A4C: 130.0 ml LV vol s, MOD A4C: 46.6 ml LV SV MOD A4C:     130.0 ml LEFT ATRIUM         Index LA diam:    4.40 cm 1.49 cm/m Marcina Millard MD Electronically signed by Marcina Millard MD Signature Date/Time: 03/05/2023/10:35:12 AM    Final    MR BRAIN WO CONTRAST Result Date: 03/04/2023 CLINICAL DATA:  Follow-up stroke EXAM: MRI HEAD WITHOUT CONTRAST TECHNIQUE: Multiplanar, multiecho pulse sequences of the brain and surrounding structures were obtained without intravenous contrast. COMPARISON:  Head CT from yesterday.  Brain MRI 02/14/2023 FINDINGS: Brain: Small acute infarct in the left occipital cortex since prior. Bleeding restricted diffusion in the right frontal cortex at site of subacute infarction. Some cortical laminar necrosis in this region by T1 weighted imaging. Pre-existing infarcts in the right cerebellum and left parietooccipital cortex. Diffusion hyperintensity in the midline related to dural ossification with artifact. No acute hemorrhage, hydrocephalus, or collection. Vascular: Major flow voids are preserved. Skull and upper cervical spine: Normal marrow signal. Sinuses/Orbits: Negative. IMPRESSION: *Punctate acute infarct in the left occipital cortex since MRI 2 weeks prior. *Expected evolution of subacute right frontal infarct. 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 Electronically Signed   By: Tiburcio Pea M.D.   On: 03/04/2023 07:12     Echo multivessel ventricular function EF around 45%  TELEMETRY: Sinus rhythm inferior Q waves possible diffuse inferior lateral ST  changes rate of 65:  ASSESSMENT AND PLAN:  Principal Problem:   STEMI (ST elevation myocardial infarction) (HCC) Active Problems:   Chronic diastolic heart  failure (HCC)   HTN (hypertension)   AKI (acute kidney injury) (HCC)   Morbid obesity with BMI of 50.0-59.9, adult (HCC)   Essential hypertension   Type 2 diabetes mellitus without complications (HCC)   Iron deficiency anemia   COPD (chronic obstructive pulmonary disease) (HCC)   Stroke (HCC)   Slurred speech    Plan Elevated troponin more consistent with demand ischemia than a non-STEMI.  Status post cardiac cath with no significant obstructive coronary disease mildly depressed left ventricular function Chronic diastolic heart failure currently reasonably compensated Acute on chronic renal insufficiency agree with nephrology input Morbid obesity recommend significant weight loss Probable obstructive sleep apnea recommend sleep study CPAP weight loss Diabetes type 2 uncomplicated continue current therapy COPD inhalers follow-up with pulmonary CVA recommend neurology input and management Service cardiac input at this point   Alwyn Pea, MD,\ 03/05/2023 9:54 PM

## 2023-03-05 NOTE — Plan of Care (Addendum)
  Problem: Education: Goal: Knowledge of General Education information will improve Description: Including pain rating scale, medication(s)/side effects and non-pharmacologic comfort measures Outcome: Progressing   Problem: Clinical Measurements: Goal: Ability to maintain clinical measurements within normal limits will improve Outcome: Progressing Goal: Will remain free from infection Outcome: Progressing Goal: Respiratory complications will improve Outcome: Progressing   Problem: Coping: Goal: Level of anxiety will decrease Outcome: Progressing   Problem: Pain Managment: Goal: General experience of comfort will improve and/or be controlled Outcome: Progressing   Problem: Safety: Goal: Ability to remain free from injury will improve Outcome: Progressing   Problem: Skin Integrity: Goal: Risk for impaired skin integrity will decrease Outcome: Progressing

## 2023-03-05 NOTE — Progress Notes (Signed)
NAME:  Raymond [name], MRN:  [id number], DOB:  09-Jul-1965, LOS: 2 ADMISSION DATE:  03/03/2023, CONSULTATION DATE:  03/03/23 REFERRING MD:  Shirlean Kelly  CHIEF COMPLAINT:  slurred speech , hypotension    HPI  58 y.o African American Male with significant PMH of morbid obesity, chronic  HFpEF, HTN, OSA on CPAP, CAD, T2DM, and recent CVA on 02/14/2023 who presented to the ED with chief complaints of possible infectious process.  On review of chart, patient was recently hospitalized on 02/14/2023 with CVA deemed not a candidate for thrombolytics due to being outside of the window period. He was treated with DuoNeb and discharged on 1/27.  He was sent to the ED today by family who noted he had not been eating, drinking, or urinating well. He reported that he ran out of his home oxygen.  He was also noted with slurred speech and hypotension, per family LKNW was 7 PM.  On EMS arrival patient was hypotensive so was given 500 cc bolus.  Initial EKG was concerning for potential STEMI however patient had no chest pain or shortness of breath   ED Course: Initial vital signs showed HR of 69 beats/minute, BP 97/89mm Hg, the RR 20 breaths/minute, and the oxygen saturation 91% on 4L [place] and a temperature of 98.20F (36.8C).  Ischial EKG in the ED was concerning for inferior STEMI.  Due to significantly elevated troponin and abnormal EKG, code STEMI was activated and patient taken to Cath Lab for emergent cath.  Pertinent Labs/Diagnostics Findings: Na+/ K+: 135/4.1 Glucose: 157 BUN/Cr.:45/3.44  WBC: 13.1 K/L  PCT: 0.20 Lactic acid: 2.4 COVID PCR: Negative,  troponin: 1575  BNP:106.6   CXR> CTH> CTA Chest> CT Abd/pelvis> Medication administered in the WU:JWJX Disposition:ICU  03/05/23- patient weaning from levophed, dcd abx due to absence of PNA, initiating PT, heparin gtt to lovenox.  Optimizing for TRH transfer.  Noncompliant with BIPAP  Past Medical History  morbid obesity, chronic  HFpEF, HTN,  OSA on CPAP, CAD, T2DM, and recent CVA on 02/14/2023  Significant Hospital Events   01/31:Admit with STEMI s/p LHC, Acute on Chronic Hypoxic Hypercapnic Respiratory Failure   Consults:  Cardiology Nephrology  Procedures:  01/31: Left heart cath  Significant Diagnostic Tests:  01/31: Chest Xray> IMPRESSION: Cardiomegaly with pulmonary vascular congestion. 01/31: Noncontrast CT head> IMPRESSION: 1. Evolving right frontal infarct. No evidence of new/interval acute abnormality; however, MRI could provide more sensitive evaluation for new peri-infarct ischemia if clinically warranted. 2. Remote cerebellar and left parietal infarcts. 01/31: CT Renal > IMPRESSION: 1. No acute intra-abdominal pathology identified. No definite radiographic explanation for the patient's reported symptoms. 2. Asymmetric subcutaneous edema and dermal thickening involving the: Left heart cath pannus which may relate to passive a though a superficial inflammatory process such as cellulitis, could appear similarly. 3. Bilateral hip effusions are suspected, well assessed on this examination. 4. Mild visceral arteriosclerosis.   Micro Data:  01/31: SARS-CoV-2 PCR> negative 01/31: Influenza PCR> negative 01/31: Blood culture x2> 01/31: MRSA PCR>> nonreactive  Antimicrobials:  1/31 Vancomycin>> 1/31 Cefepime>>  OBJECTIVE  Blood pressure (!) 128/56, pulse 68, temperature 98.5 F (36.9 C), temperature source Axillary, resp. rate 18, height 6\' 2"  (1.88 m), weight (!) 187 kg, SpO2 93%.    FiO2 (%):  [50 %] 50 %   Intake/Output Summary (Last 24 hours) at 03/05/2023 1120 Last data filed at 03/05/2023 1057 Gross per 24 hour  Intake 1564.71 ml  Output 2225 ml  Net -660.29 ml  Filed Weights   03/03/23 1911 03/04/23 0500  Weight: (!) 187.2 kg (!) 187 kg     Physical Examination  GENERAL:58 year-old morbidly obese critically ill patient lying in the bed altered EYES: PEERLA. No scleral icterus.  Extraocular muscles intact.  HEENT: Head atraumatic, normocephalic. Oropharynx and nasopharynx clear.  NECK:  No JVD, supple  LUNGS: Decreased breath sounds bilaterally.  Mild use of accessory muscles of respiration.  CARDIOVASCULAR: S1, S2 normal. No murmurs, rubs, or gallops.  ABDOMEN: Soft, NTND EXTREMITIES: 2+ pitting edema of bilateral lower extremity. Bilateral toes amputations. Capillary refill >3 seconds in all extremities. Pulses palpable distally. NEUROLOGIC: The patient is altered. No focal neurological deficit appreciated. Cranial nerves are intact.  SKIN: No obvious rash, lesion, or ulcer. Warm to touch Labs/imaging that I havepersonally reviewed  (right click and "Reselect all SmartList Selections" daily)     Labs   CBC: Recent Labs  Lab 03/03/23 1915 03/04/23 0547 03/05/23 0211  WBC 13.1* 10.9* 9.0  NEUTROABS 9.6*  --   --   HGB 10.6* 10.1* 9.5*  HCT 38.3* 36.4* 33.7*  MCV 78.2* 77.4* 75.7*  PLT 387 365 364    Basic Metabolic Panel: Recent Labs  Lab 03/03/23 1915 03/04/23 0547 03/05/23 0211  NA 135 136 136  K 4.1 4.0 4.3  CL 96* 99 99  CO2 25 25 26   GLUCOSE 157* 156* 209*  BUN 45* 47* 45*  CREATININE 3.44* 2.90* 1.98*  CALCIUM 8.2* 7.7* 8.0*  MG  --  2.4 2.6*  PHOS  --  7.1* 6.1*   GFR: Estimated Creatinine Clearance: 70.5 mL/min (A) (by C-G formula based on SCr of 1.98 mg/dL (H)). Recent Labs  Lab 03/03/23 1915 03/03/23 2245 03/04/23 0547 03/05/23 0211  PROCALCITON 0.20  --   --   --   WBC 13.1*  --  10.9* 9.0  LATICACIDVEN 2.4* 2.0*  --   --     Liver Function Tests: Recent Labs  Lab 03/03/23 1915  AST 15  ALT 19  ALKPHOS 55  BILITOT 0.6  PROT 6.8  ALBUMIN 3.2*   No results for input(s): "LIPASE", "AMYLASE" in the last 168 hours. No results for input(s): "AMMONIA" in the last 168 hours.  ABG    Component Value Date/Time   PHART 7.27 (L) 03/03/2023 2258   PCO2ART 62 (H) 03/03/2023 2258   PO2ART 97 03/03/2023 2258   HCO3  28.5 (H) 03/03/2023 2258   O2SAT 97.9 03/03/2023 2258     Coagulation Profile: No results for input(s): "INR", "PROTIME" in the last 168 hours.  Cardiac Enzymes: No results for input(s): "CKTOTAL", "CKMB", "CKMBINDEX", "TROPONINI" in the last 168 hours.  HbA1C: Hemoglobin A1C  Date/Time Value Ref Range Status  03/09/2011 03:43 AM 8.1 (H) 4.2 - 6.3 % Final    Comment:    The American Diabetes Association recommends that a primary goal of therapy should be <7% and that physicians should reevaluate the treatment regimen in patients with HbA1c values consistently >8%.    Hgb A1c MFr Bld  Date/Time Value Ref Range Status  02/14/2023 01:50 PM 6.7 (H) 4.8 - 5.6 % Final    Comment:    (NOTE) Pre diabetes:          5.7%-6.4%  Diabetes:              >6.4%  Glycemic control for   <7.0% adults with diabetes   06/07/2022 01:34 PM 7.4 (H) 4.8 - 5.6 % Final  Comment:    (NOTE) Pre diabetes:          5.7%-6.4%  Diabetes:              >6.4%  Glycemic control for   <7.0% adults with diabetes     CBG: Recent Labs  Lab 03/04/23 2210 03/04/23 2353 03/05/23 0222 03/05/23 0401 03/05/23 0823  GLUCAP 233* 205* 190* 179* 156*    Review of Systems:   Unable to be obtained secondary to the patient's altered mental status.   Past Medical History  He,  has a past medical history of Arthritis, Cellulitis, CHF (congestive heart failure) (HCC), Chronic diastolic heart failure (HCC), Chronic respiratory failure with hypoxia (HCC), Diabetes mellitus type 2, insulin dependent (HCC), Diabetic foot infection (HCC) (06/07/2022), Edema, GERD (gastroesophageal reflux disease), Hepatic steatosis, HOH (hard of hearing), Hypertension, Morbid obesity (HCC), Morbid obesity with BMI of 45.0-49.9, adult (HCC), Neuropathy, Orthopnea, OSA on CPAP, Oxygen deficiency, PVD (peripheral vascular disease) (HCC), Shortness of breath dyspnea, and Systolic heart failure (HCC).   Surgical History    Past  Surgical History:  Procedure Laterality Date   AMPUTATION Right 10/02/2014   Procedure: AMPUTATION RAY;  Surgeon: Recardo Evangelist, DPM;  Location: [facility] ORS;  Service: Podiatry;  Laterality: Right;   AMPUTATION TOE Right 06/09/2022   Procedure: AMPUTATION 2ND TOE RIGHT FOOT;  Surgeon: Candelaria Stagers, DPM;  Location: [facility] OR[facility];  Service: Podiatry;  Laterality: Right;   CATARACT EXTRACTION W/PHACO Right 09/10/2015   Procedure: CATARACT EXTRACTION PHACO AND INTRAOCULAR LENS PLACEMENT (IOC);  Surgeon: Galen Manila, MD;  Location: [facility] ORS;  Service: Ophthalmology;  Laterality: Right;  [place] 01:34 AP% 19.3 CDE 18.36 Fluid pack lot # [id number]   CATARACT EXTRACTION W/PHACO Left 10/06/2015   Procedure: CATARACT EXTRACTION PHACO AND INTRAOCULAR LENS PLACEMENT (IOC);  Surgeon: Galen Manila, MD;  Location: [facility] ORS;  Service: Ophthalmology;  Laterality: Left;  [place] 00:56 AP% 19.5 CDE 11.02 Fluid Pack # [id number]   CHOLECYSTECTOMY     COLONOSCOPY WITH PROPOFOL N/A 05/17/2016   Procedure: COLONOSCOPY WITH PROPOFOL;  Surgeon: Christena Deem, MD;  Location: [facility] ENDOSCOPY;  Service: Endoscopy;  Laterality: N/A;   COLONOSCOPY WITH PROPOFOL N/A 12/27/2021   Procedure: COLONOSCOPY WITH PROPOFOL;  Surgeon: Wyline Mood, MD;  Location: [facility] ENDOSCOPY;  Service: Gastroenterology;  Laterality: N/A;   EYE SURGERY Left    bilat laser   HIP SURGERY Right    8 th grade pins   LOWER EXTREMITY ANGIOGRAPHY Right 06/08/2022   Procedure: Lower Extremity Angiography;  Surgeon: Renford Dills, MD;  Location: [facility] INVASIVE CV LAB;  Service: Cardiovascular;  Laterality: Right;   NOSE SURGERY     PERIPHERAL VASCULAR CATHETERIZATION Right 10/02/2014   Procedure: Lower Extremity Angiography;  Surgeon: Annice Needy, MD;  Location: [facility] INVASIVE CV LAB;  Service: Cardiovascular;  Laterality: Right;   TOE AMPUTATION Left 2010   2cd   TOE AMPUTATION Right 2009     Social History   reports that he has never smoked. He has never  used smokeless tobacco. He reports that he does not drink alcohol and does not use drugs.   Family History   His family history includes Diabetes in his mother; Heart attack in his father; Hypertension in his father and mother; Stroke in his father.   Allergies No Known Allergies   Home Medications  Prior to Admission medications   Medication Sig Start Date End Date Taking? Authorizing Provider  aspirin EC 81 MG tablet Take 81 mg  by mouth daily.    [provider]  atorvastatin (LIPITOR) 40 MG tablet atorvastatin 40 mg tablet    [provider]  BYETTA 10 MCG PEN 10 MCG/0.04ML SOPN injection Inject 10 mcg into the skin 2 (two) times daily with a meal. 12/26/22   [provider]  carvedilol (COREG) 25 MG tablet Take 1 tablet (25 mg total) by mouth 2 (two) times daily with a meal. 02/26/23   Arnetha Courser, MD  clopidogrel (PLAVIX) 75 MG tablet Take 1 tablet (75 mg total) by mouth daily for 21 days. 02/27/23 03/20/23  Arnetha Courser, MD  empagliflozin (JARDIANCE) 25 MG TABS tablet Take by mouth daily. Once daily in the am    [provider]  Fe Fum-Vit C-Vit B12-FA (TRIGELS-F FORTE) CAPS capsule Take 1 capsule by mouth 2 (two) times daily. 02/26/23   Arnetha Courser, MD  gabapentin (NEURONTIN) 300 MG capsule Take 300 mg by mouth 3 (three) times daily.    [provider]  insulin aspart (NOVOLOG) 100 UNIT/ML injection Inject into the skin 3 (three) times daily before meals.    [provider]  isosorbide mononitrate (IMDUR) 60 MG 24 hr tablet Take 1 tablet (60 mg total) by mouth at bedtime. 02/26/23   Arnetha Courser, MD  LANTUS 100 UNIT/ML injection Inject 90 Units into the skin 2 (two) times daily. 04/28/22   [provider]  metFORMIN (GLUCOPHAGE) 1000 MG tablet Take 1,000 mg by mouth 2 (two) times daily with a meal.    [provider]  Multiple Vitamins-Minerals (CENTRUM SILVER 50+MEN) TABS Take 1 tablet by mouth daily.    [provider]  OXYGEN Inhale 2 L into the lungs daily.    [provider]  pantoprazole (PROTONIX) 40 MG tablet Take 40 mg by mouth daily.    [provider]  polyethylene glycol (MIRALAX / GLYCOLAX) 17 g packet Take 17 g by mouth daily as needed for mild constipation. 02/26/23   Arnetha Courser, MD  tamsulosin (FLOMAX) 0.4 MG CAPS capsule Take 1 capsule (0.4 mg total) by mouth daily after supper. 02/26/23   Arnetha Courser, MD  torsemide (DEMADEX) 20 MG tablet Take 2 tablets (40 mg total) by mouth daily. 05/01/17   Rai, Delene Ruffini, MD  TRUEPLUS INSULIN SYRINGE 29G X 1/2" 1 ML MISC  02/16/18   [provider]  TRUEPLUS PEN NEEDLES 31G X 8 MM MISC  02/06/18   [provider]  valsartan (DIOVAN) 160 MG tablet Take 160 mg by mouth daily. 07/11/22   [provider]  Scheduled Meds:  Chlorhexidine Gluconate Cloth  6 each Topical Daily   enoxaparin (LOVENOX) injection  90 mg Subcutaneous Q24H   insulin aspart  0-20 Units Subcutaneous Q4H   Continuous Infusions:  ceFEPime (MAXIPIME) IV 200 mL/hr at 03/05/23 1057   norepinephrine (LEVOPHED) Adult infusion 2 mcg/min (03/05/23 1057)   vancomycin Stopped (03/05/23 0624)   PRN Meds:.acetaminophen, docusate sodium, ondansetron (ZOFRAN) IV, oxyCODONE-acetaminophen, polyethylene glycol, sodium chloride flush   Active Hospital Problem list   See systems below  Assessment & Plan:  #Acute on Chronic Hypoxic Hypercapnic Respiratory Failure secondary to  #Acute on chronic systolic heart failure #OSA on CPAP -Supplemental O2 as needed to maintain O2 saturations 88 to 92% -BiPAP, wean as tolerated -High risk for intubation -Follow intermittent ABG and chest x-ray as needed -Hold Lasix in the setting of AKI and hypotension -As needed bronchodilators   #Inferior STEMI s/p LHC nonrevealing for obstructive CAD #HFpEF  prior EF 50-55% with new reduced systolic function with LVEF 450% on 03/03/23 #Cardiogenic Shock PMHx:   HLD  HTN -Trend troponin until peaked -Start Heparin gtt -Aspirin 81 + Plavix 75 every day -For afterload reduction: Hold isosorbide -GDMT: Hold carvedilol,Jardiance, iso hypotension -Daily APTT, CBC -Lipitor 40mg  daily  -Cardiology following appreciate input   #Leukocytosis likely reactive in the setting of STEMI No obvious source of infection, chest x-ray neg, CT? Cellulitis and UA pending -F/u cultures, trend lactic/ PCT -Monitor WBC/ fever curve -continue empiric antibiotics for now pending cultures -Pressors for MAP goal >65 -Strict I/O's   #AKI  likely prerenal in the setting of shock as above -trend lactate -Monitor I&O's / urinary output -Follow BMP -Ensure adequate renal perfusion -Avoid nephrotoxic agents as able -Replace electrolytes as indicated -Nephrology consult as appropriate  #T2DM  Diabetic Neuropathy  Recent A1c 6.7.  -CBGs q4 -Sliding scale insulin -Follow ICU hyper/hypoglycemia protocol -Hold home Meds while on SSI  #Acute Metabolic Encephalopathy  Recent CVA -CTH -Obtain follow up MRI Brain -Provide supportive care -BiPAP to treat Hypercapnia     Best practice:  Diet:  NPO Pain/Anxiety/Delirium protocol (if indicated): No VAP protocol (if indicated): Not indicated DVT prophylaxis: Systemic AC GI prophylaxis: PPI Glucose control:  SSI Yes Central venous access:  N/A Arterial line:  N/A Foley:  Yes, and it is still needed Mobility:  bed rest  PT consulted: N/A Last date of multidisciplinary goals of care discussion [Updated sister at the bedside] Code Status:  full code Disposition: ICU   = Goals of Care = Code Status Order: FULL  Primary Emergency Contact: Dennis,Glenda Wishes to pursue full aggressive treatment and intervention options, including CPR and intubation, but goals of care will be addressed on going with family if that should become necessary.  Critical care provider statement:   Total critical care time: 33  minutes   Performed by: Karna Christmas MD   Critical care time was exclusive of separately billable procedures and treating other patients.   Critical care was necessary to treat or prevent imminent or life-threatening deterioration.   Critical care was time spent personally by me on the following activities: development of treatment plan with patient and/or surrogate as well as nursing, discussions with consultants, evaluation of patient's response to treatment, examination of patient, obtaining history from patient or surrogate, ordering and performing treatments and interventions, ordering and review of laboratory studies, ordering and review of radiographic studies, pulse oximetry and re-evaluation of patient's condition.    Vida Rigger, M.D.  Pulmonary & Critical Care Medicine

## 2023-03-06 ENCOUNTER — Encounter: Payer: Self-pay | Admitting: Cardiology

## 2023-03-06 DIAGNOSIS — I5021 Acute systolic (congestive) heart failure: Secondary | ICD-10-CM | POA: Diagnosis not present

## 2023-03-06 DIAGNOSIS — G928 Other toxic encephalopathy: Secondary | ICD-10-CM

## 2023-03-06 DIAGNOSIS — E119 Type 2 diabetes mellitus without complications: Secondary | ICD-10-CM

## 2023-03-06 DIAGNOSIS — J9621 Acute and chronic respiratory failure with hypoxia: Secondary | ICD-10-CM | POA: Diagnosis not present

## 2023-03-06 DIAGNOSIS — N179 Acute kidney failure, unspecified: Secondary | ICD-10-CM

## 2023-03-06 DIAGNOSIS — G4733 Obstructive sleep apnea (adult) (pediatric): Secondary | ICD-10-CM

## 2023-03-06 DIAGNOSIS — J9622 Acute and chronic respiratory failure with hypercapnia: Secondary | ICD-10-CM

## 2023-03-06 LAB — LIPOPROTEIN A (LPA): Lipoprotein (a): 40.9 nmol/L — ABNORMAL HIGH (ref <75.0)

## 2023-03-06 LAB — BASIC METABOLIC PANEL
Anion gap: 6 (ref 5–15)
BUN: 49 mg/dL — ABNORMAL HIGH (ref 6–20)
CO2: 27 mmol/L (ref 22–32)
Calcium: 8.1 mg/dL — ABNORMAL LOW (ref 8.9–10.3)
Chloride: 104 mmol/L (ref 98–111)
Creatinine, Ser: 1.61 mg/dL — ABNORMAL HIGH (ref 0.61–1.24)
GFR, Estimated: 49 mL/min — ABNORMAL LOW (ref >60)
Glucose, Bld: 201 mg/dL — ABNORMAL HIGH (ref 70–99)
Potassium: 4.9 mmol/L (ref 3.5–5.1)
Sodium: 137 mmol/L (ref 135–145)

## 2023-03-06 LAB — CBC
HCT: 32.6 % — ABNORMAL LOW (ref 39.0–52.0)
Hemoglobin: 9.2 g/dL — ABNORMAL LOW (ref 13.0–17.0)
MCH: 21.4 pg — ABNORMAL LOW (ref 26.0–34.0)
MCHC: 28.2 g/dL — ABNORMAL LOW (ref 30.0–36.0)
MCV: 75.8 fL — ABNORMAL LOW (ref 80.0–100.0)
Platelets: 298 10*3/uL (ref 150–400)
RBC: 4.3 MIL/uL (ref 4.22–5.81)
RDW: 18.7 % — ABNORMAL HIGH (ref 11.5–15.5)
WBC: 6.2 10*3/uL (ref 4.0–10.5)
nRBC: 0 % (ref 0.0–0.2)

## 2023-03-06 LAB — GLUCOSE, CAPILLARY
Glucose-Capillary: 165 mg/dL — ABNORMAL HIGH (ref 70–99)
Glucose-Capillary: 195 mg/dL — ABNORMAL HIGH (ref 70–99)
Glucose-Capillary: 204 mg/dL — ABNORMAL HIGH (ref 70–99)
Glucose-Capillary: 240 mg/dL — ABNORMAL HIGH (ref 70–99)
Glucose-Capillary: 248 mg/dL — ABNORMAL HIGH (ref 70–99)
Glucose-Capillary: 263 mg/dL — ABNORMAL HIGH (ref 70–99)

## 2023-03-06 LAB — MAGNESIUM: Magnesium: 2.5 mg/dL — ABNORMAL HIGH (ref 1.7–2.4)

## 2023-03-06 LAB — PHOSPHORUS: Phosphorus: 5.3 mg/dL — ABNORMAL HIGH (ref 2.5–4.6)

## 2023-03-06 MED ORDER — CARVEDILOL 12.5 MG PO TABS
12.5000 mg | ORAL_TABLET | Freq: Two times a day (BID) | ORAL | Status: DC
  Administered 2023-03-06 – 2023-03-13 (×14): 12.5 mg via ORAL
  Filled 2023-03-06 (×14): qty 1

## 2023-03-06 MED ORDER — INSULIN ASPART 100 UNIT/ML IJ SOLN
0.0000 [IU] | Freq: Three times a day (TID) | INTRAMUSCULAR | Status: DC
  Administered 2023-03-06: 7 [IU] via SUBCUTANEOUS
  Administered 2023-03-07: 4 [IU] via SUBCUTANEOUS
  Administered 2023-03-07: 11 [IU] via SUBCUTANEOUS
  Administered 2023-03-07: 7 [IU] via SUBCUTANEOUS
  Administered 2023-03-08: 11 [IU] via SUBCUTANEOUS
  Administered 2023-03-08: 4 [IU] via SUBCUTANEOUS
  Administered 2023-03-08 – 2023-03-09 (×3): 7 [IU] via SUBCUTANEOUS
  Administered 2023-03-09: 3 [IU] via SUBCUTANEOUS
  Administered 2023-03-10: 7 [IU] via SUBCUTANEOUS
  Administered 2023-03-10: 4 [IU] via SUBCUTANEOUS
  Administered 2023-03-10 – 2023-03-11 (×2): 7 [IU] via SUBCUTANEOUS
  Administered 2023-03-11 (×2): 4 [IU] via SUBCUTANEOUS
  Administered 2023-03-12 – 2023-03-13 (×4): 7 [IU] via SUBCUTANEOUS
  Administered 2023-03-13: 4 [IU] via SUBCUTANEOUS
  Filled 2023-03-06 (×20): qty 1

## 2023-03-06 MED ORDER — TAMSULOSIN HCL 0.4 MG PO CAPS
0.4000 mg | ORAL_CAPSULE | Freq: Every day | ORAL | Status: DC
  Administered 2023-03-06 – 2023-03-12 (×7): 0.4 mg via ORAL
  Filled 2023-03-06 (×7): qty 1

## 2023-03-06 MED ORDER — INSULIN GLARGINE-YFGN 100 UNIT/ML ~~LOC~~ SOLN
20.0000 [IU] | Freq: Two times a day (BID) | SUBCUTANEOUS | Status: DC
  Administered 2023-03-06 – 2023-03-11 (×12): 20 [IU] via SUBCUTANEOUS
  Filled 2023-03-06 (×15): qty 0.2

## 2023-03-06 MED ORDER — INSULIN ASPART 100 UNIT/ML IJ SOLN
6.0000 [IU] | Freq: Three times a day (TID) | INTRAMUSCULAR | Status: DC
Start: 2023-03-06 — End: 2023-03-06

## 2023-03-06 MED ORDER — ATORVASTATIN CALCIUM 20 MG PO TABS
40.0000 mg | ORAL_TABLET | Freq: Every day | ORAL | Status: DC
  Administered 2023-03-06 – 2023-03-13 (×8): 40 mg via ORAL
  Filled 2023-03-06 (×8): qty 2

## 2023-03-06 MED ORDER — INSULIN ASPART 100 UNIT/ML IJ SOLN
0.0000 [IU] | Freq: Every day | INTRAMUSCULAR | Status: DC
  Administered 2023-03-06: 3 [IU] via SUBCUTANEOUS
  Administered 2023-03-07 – 2023-03-12 (×6): 2 [IU] via SUBCUTANEOUS
  Filled 2023-03-06 (×7): qty 1

## 2023-03-06 MED ORDER — PANTOPRAZOLE SODIUM 40 MG PO TBEC
40.0000 mg | DELAYED_RELEASE_TABLET | Freq: Every day | ORAL | Status: DC
  Administered 2023-03-06 – 2023-03-13 (×8): 40 mg via ORAL
  Filled 2023-03-06 (×8): qty 1

## 2023-03-06 MED ORDER — INSULIN ASPART 100 UNIT/ML IJ SOLN
6.0000 [IU] | Freq: Three times a day (TID) | INTRAMUSCULAR | Status: DC
  Administered 2023-03-06 – 2023-03-07 (×3): 6 [IU] via SUBCUTANEOUS
  Filled 2023-03-06 (×3): qty 1

## 2023-03-06 NOTE — Inpatient Diabetes Management (Signed)
Inpatient Diabetes Program Recommendations  AACE/ADA: New Consensus Statement on Inpatient Glycemic Control (2015)  Target Ranges:  Prepandial:   less than 140 mg/dL      Peak postprandial:   less than 180 mg/dL (1-2 hours)      Critically ill patients:  140 - 180 mg/dL   Lab Results  Component Value Date   GLUCAP 240 (H) 03/06/2023   HGBA1C 6.7 (H) 02/14/2023    Latest Reference Range & Units 03/05/23 08:23 03/05/23 11:54 03/05/23 16:49 03/05/23 20:08 03/05/23 21:53 03/06/23 00:22 03/06/23 04:04 03/06/23 07:22 03/06/23 11:20  Glucose-Capillary 70 - 99 mg/dL 161 (H) 096 (H) 045 (H) 249 (H) 259 (H) 248 (H) 195 (H) 165 (H) 240 (H)  (H): Data is abnormally high  Diabetes history: DM  Outpatient Diabetes medications:  Metformin 1000 mg bid Lantus 60 units bid Novolog tid with meals  Current orders for Inpatient glycemic control:  Semglee 20 units bid Novolog 0-20 units tid q 4 hrs.  Inpatient Diabetes Program Recommendations:   Please consider: -Change Novolog correction to tid, 0-5 units hs -Add Novolog 6 units tid meal coverage if eats 50% meals -Add Carb mod to diet order if appropriate  Thank you, Darel Hong E. Laporsche Hoeger, RN, MSN, CDCES  Diabetes Coordinator Inpatient Glycemic Control Team Team Pager [phone] (8am-5pm) 03/06/2023 1:11 PM

## 2023-03-06 NOTE — Plan of Care (Signed)
  Problem: Education: Goal: Knowledge of General Education information will improve Description: Including pain rating scale, medication(s)/side effects and non-pharmacologic comfort measures Outcome: Progressing   Problem: Health Behavior/Discharge Planning: Goal: Ability to manage health-related needs will improve Outcome: Progressing   Problem: Clinical Measurements: Goal: Ability to maintain clinical measurements within normal limits will improve Outcome: Progressing Goal: Will remain free from infection Outcome: Progressing Goal: Diagnostic test results will improve Outcome: Progressing Goal: Respiratory complications will improve Outcome: Progressing   Problem: Activity: Goal: Risk for activity intolerance will decrease Outcome: Progressing   Problem: Safety: Goal: Ability to remain free from injury will improve Outcome: Progressing   Problem: Skin Integrity: Goal: Risk for impaired skin integrity will decrease Outcome: Progressing

## 2023-03-06 NOTE — Progress Notes (Signed)
Patient transferred to 2A. 4 L [place]. AxOx4. VSS. All patient belongings kept at bedside sent with patient's family.

## 2023-03-06 NOTE — Progress Notes (Signed)
NAME:  Raymond [name], MRN:  [id number], DOB:  May 23, 1965, LOS: 3 ADMISSION DATE:  03/03/2023,  CHIEF COMPLAINT:  hypotension  History of Present Illness:   58 y.o African American Male with significant PMH of morbid obesity, chronic  HFpEF, HTN, OSA on CPAP, CAD, T2DM, and recent CVA on 02/14/2023 who presented to the ED with chief complaints of possible infectious process.   On review of chart, patient was recently hospitalized on 02/14/2023 with CVA deemed not a candidate for thrombolytics due to being outside of the window period. He was treated with DuoNeb and discharged on 1/27.  He was sent to the ED today by family who noted he had not been eating, drinking, or urinating well. He reported that he ran out of his home oxygen.  He was also noted with slurred speech and hypotension, per family LKNW was 7 PM.  On EMS arrival patient was hypotensive so was given 500 cc bolus.  Initial EKG was concerning for potential STEMI however patient had no chest pain or shortness of breath   ED Course: Initial vital signs showed HR of 69 beats/minute, BP 97/9mm Hg, the RR 20 breaths/minute, and the oxygen saturation 91% on 4L [place] and a temperature of 98.49F (36.8C).  Ischial EKG in the ED was concerning for inferior STEMI.  Due to significantly elevated troponin and abnormal EKG, code STEMI was activated and patient taken to Cath Lab for emergent cath.   Pertinent Labs/Diagnostics Findings: Na+/ K+: 135/4.1 Glucose: 157 BUN/Cr.:45/3.44  WBC: 13.1 K/L  PCT: 0.20 Lactic acid: 2.4 COVID PCR: Negative,  troponin: 1575  BNP:106.6   CXR> CTH> CTA Chest> CT Abd/pelvis> Medication administered in the NW:GNFA Disposition:ICU   03/05/23- patient weaning from levophed, dcd abx due to absence of PNA, initiating PT, heparin gtt to lovenox.  Optimizing for TRH transfer.  Noncompliant with BIPAP   Significant Hospital Events: Including procedures, antibiotic start and stop dates in addition to other  pertinent events   01/31:Admit with STEMI s/p LHC, Acute on Chronic Hypoxic Hypercapnic Respiratory Failure  03/05/23- patient weaning from levophed, dcd abx due to absence of PNA, initiating PT, heparin gtt to lovenox.  Optimizing for TRH transfer.  03/06/23 - compliant with BiPAP, off vasopressors, breathing is improved.  Interim History / Subjective:  Breathing much improved this AM, denies chest pain, compliant with BiPAP overnight.  Objective   Blood pressure (!) 148/50, pulse 66, temperature 99.1 F (37.3 C), temperature source Axillary, resp. rate 16, height 6\' 2"  (1.88 m), weight (!) 186.6 kg, SpO2 97%.    FiO2 (%):  [45 %-50 %] 45 %   Intake/Output Summary (Last 24 hours) at 03/06/2023 1812 Last data filed at 03/06/2023 1700 Gross per 24 hour  Intake 480 ml  Output 2500 ml  Net -2020 ml   Filed Weights   03/03/23 1911 03/04/23 0500 03/06/23 0500  Weight: (!) 187.2 kg (!) 187 kg (!) 186.6 kg    Examination: Physical Exam Constitutional:      Appearance: He is obese. He is not ill-appearing.  Cardiovascular:     Rate and Rhythm: Normal rate and regular rhythm.     Pulses: Normal pulses.     Heart sounds: Normal heart sounds.  Pulmonary:     Breath sounds: Normal breath sounds. No stridor. No wheezing or rales.  Abdominal:     Palpations: Abdomen is soft.  Neurological:     General: No focal deficit present.     Mental Status: He is  alert and oriented to person, place, and time. Mental status is at baseline.     Assessment & Plan:   #Acute on Chronic Hypoxic and Hypercapnic Respiratory Failure #Acute Decompensated HFrEF #Toxic Metabolic Encephalopathy #CO2 narcosis #Type II MI #AKI #CVA #OSA on CPAP #T2DM  Neuro - admit with encephalopathy in the setting of hypercapnic respiratory failure, improved with BiPAP. Recent CVA, treated medically. MRI brain shows evolution of previously noted infarct. CV - initial concern for STEMI without chest pain, LHC with  non-obstructive CAD. Continued on DAPT (for CVA above). GDMT held secondary to hypotension, shock resolved. Restarted carvedilol today for hypertension, but holding ARB and other agents given AKI. Pulm - Hypoxic and Hypercapnic respiratory failure with resultant encephalopathy. Improved with BiPAP support and diuresis. Continue NIPPV nocturnally. Renal - AKI in the setting of contrast following LHC and diuresis. Holding nephrotoxins and monitoring kidney function. This is improved today. Endo - on insulin basal bolus regimen for glycemic control GI - advanced diet as tolerated Hem/Onc - d/c heparin, continue dapt and DVT prophylaxis with enoxaparin ID - no acute issues   Best Practice (right click and "Reselect all SmartList Selections" daily)   Diet/type: Regular consistency (see orders) DVT prophylaxis LMWH Pressure ulcer(s): N/A GI prophylaxis: N/A Lines: N/A Foley:  N/A Code Status:  full code Last date of multidisciplinary goals of care discussion [03/06/2023]  Labs   CBC: Recent Labs  Lab 03/03/23 1915 03/04/23 0547 03/05/23 0211 03/06/23 0512  WBC 13.1* 10.9* 9.0 6.2  NEUTROABS 9.6*  --   --   --   HGB 10.6* 10.1* 9.5* 9.2*  HCT 38.3* 36.4* 33.7* 32.6*  MCV 78.2* 77.4* 75.7* 75.8*  PLT 387 365 364 298    Basic Metabolic Panel: Recent Labs  Lab 03/03/23 1915 03/04/23 0547 03/05/23 0211 03/06/23 0512  NA 135 136 136 137  K 4.1 4.0 4.3 4.9  CL 96* 99 99 104  CO2 25 25 26 27   GLUCOSE 157* 156* 209* 201*  BUN 45* 47* 45* 49*  CREATININE 3.44* 2.90* 1.98* 1.61*  CALCIUM 8.2* 7.7* 8.0* 8.1*  MG  --  2.4 2.6* 2.5*  PHOS  --  7.1* 6.1* 5.3*   GFR: Estimated Creatinine Clearance: 86.6 mL/min (A) (by C-G formula based on SCr of 1.61 mg/dL (H)). Recent Labs  Lab 03/03/23 1915 03/03/23 2245 03/04/23 0547 03/05/23 0211 03/06/23 0512  PROCALCITON 0.20  --   --   --   --   WBC 13.1*  --  10.9* 9.0 6.2  LATICACIDVEN 2.4* 2.0*  --   --   --     Liver Function  Tests: Recent Labs  Lab 03/03/23 1915  AST 15  ALT 19  ALKPHOS 55  BILITOT 0.6  PROT 6.8  ALBUMIN 3.2*   No results for input(s): "LIPASE", "AMYLASE" in the last 168 hours. No results for input(s): "AMMONIA" in the last 168 hours.  ABG    Component Value Date/Time   PHART 7.27 (L) 03/03/2023 2258   PCO2ART 62 (H) 03/03/2023 2258   PO2ART 97 03/03/2023 2258   HCO3 28.5 (H) 03/03/2023 2258   O2SAT 97.9 03/03/2023 2258     Coagulation Profile: No results for input(s): "INR", "PROTIME" in the last 168 hours.  Cardiac Enzymes: No results for input(s): "CKTOTAL", "CKMB", "CKMBINDEX", "TROPONINI" in the last 168 hours.  HbA1C: Hemoglobin A1C  Date/Time Value Ref Range Status  03/09/2011 03:43 AM 8.1 (H) 4.2 - 6.3 % Final    Comment:  The American Diabetes Association recommends that a primary goal of therapy should be <7% and that physicians should reevaluate the treatment regimen in patients with HbA1c values consistently >8%.    Hgb A1c MFr Bld  Date/Time Value Ref Range Status  02/14/2023 01:50 PM 6.7 (H) 4.8 - 5.6 % Final    Comment:    (NOTE) Pre diabetes:          5.7%-6.4%  Diabetes:              >6.4%  Glycemic control for   <7.0% adults with diabetes   06/07/2022 01:34 PM 7.4 (H) 4.8 - 5.6 % Final    Comment:    (NOTE) Pre diabetes:          5.7%-6.4%  Diabetes:              >6.4%  Glycemic control for   <7.0% adults with diabetes     CBG: Recent Labs  Lab 03/06/23 0022 03/06/23 0404 03/06/23 0722 03/06/23 1120 03/06/23 1521  GLUCAP 248* 195* 165* 240* 204*    Past Medical History:  He,  has a past medical history of Arthritis, Cellulitis, CHF (congestive heart failure) (HCC), Chronic diastolic heart failure (HCC), Chronic respiratory failure with hypoxia (HCC), Diabetes mellitus type 2, insulin dependent (HCC), Diabetic foot infection (HCC) (06/07/2022), Edema, GERD (gastroesophageal reflux disease), Hepatic steatosis, HOH (hard of  hearing), Hypertension, Morbid obesity (HCC), Morbid obesity with BMI of 45.0-49.9, adult (HCC), Neuropathy, Orthopnea, OSA on CPAP, Oxygen deficiency, PVD (peripheral vascular disease) (HCC), Shortness of breath dyspnea, and Systolic heart failure (HCC).   Surgical History:   Past Surgical History:  Procedure Laterality Date   AMPUTATION Right 10/02/2014   Procedure: AMPUTATION RAY;  Surgeon: Recardo Evangelist, DPM;  Location: [facility] ORS;  Service: Podiatry;  Laterality: Right;   AMPUTATION TOE Right 06/09/2022   Procedure: AMPUTATION 2ND TOE RIGHT FOOT;  Surgeon: Candelaria Stagers, DPM;  Location: [facility] ORS;  Service: Podiatry;  Laterality: Right;   CATARACT EXTRACTION W/PHACO Right 09/10/2015   Procedure: CATARACT EXTRACTION PHACO AND INTRAOCULAR LENS PLACEMENT (IOC);  Surgeon: Galen Manila, MD;  Location: [facility] ORS;  Service: Ophthalmology;  Laterality: Right;  [place] 01:34 AP% 19.3 CDE 18.36 Fluid pack lot # [id number]   CATARACT EXTRACTION W/PHACO Left 10/06/2015   Procedure: CATARACT EXTRACTION PHACO AND INTRAOCULAR LENS PLACEMENT (IOC);  Surgeon: Galen Manila, MD;  Location: [facility] ORS;  Service: Ophthalmology;  Laterality: Left;  [place] 00:56 AP% 19.5 CDE 11.02 Fluid Pack # [id number]   CHOLECYSTECTOMY     COLONOSCOPY WITH PROPOFOL N/A 05/17/2016   Procedure: COLONOSCOPY WITH PROPOFOL;  Surgeon: Christena Deem, MD;  Location[facility];  Service: Endoscopy;  Laterality: N/A;   COLONOSCOPY WITH PROPOFOL N/A 12/27/2021   Procedure: COLONOSCOPY WITH PROPOFOL;  Surgeon: Wyline Mood, MD;  Location: [facility] ENDOSCOPY;  Service: Gastroenterology;  Laterality: N/A;   CORONARY/GRAFT ACUTE MI REVASCULARIZATION N/A 03/03/2023   Procedure: Coronary/Graft Acute MI Revascularization;  Surgeon: Marcina Millard, MD;  Location: [facility] INVASIVE CV LAB;  Service: Cardiovascular;  Laterality: N/A;   EYE SURGERY Left    bilat laser   HIP SURGERY Right    8 th grade pins   LEFT HEART CATH AND CORONARY ANGIOGRAPHY  N/A 03/03/2023   Procedure: LEFT HEART CATH AND CORONARY ANGIOGRAPHY;  Surgeon: Marcina Millard, MD;  Location: [facility] INVASIVE CV LAB;  Service: Cardiovascular;  Laterality: N/A;   LOWER EXTREMITY ANGIOGRAPHY Right 06/08/2022   Procedure: Lower Extremity Angiography;  Surgeon: [name]  G, MD;  Location: [facility] INVAS[facility] CV LAB;  Service: Cardiovascular;  Laterality: Right;   NOSE SURGERY     PERIPHERAL VASCULAR CATHETERIZATION Right 10/02/2014   Procedure: Lower Extremity Angiography;  Surgeon: Annice Needy, MD;  Location: [facility] INVASIVE CV LAB;  Service: Cardiovascular;  Laterality: Right;   TOE AMPUTATION Left 2010   2cd   TOE AMPUTATION Right 2009     Social History:   reports that he has never smoked. He has never used smokeless tobacco. He reports that he does not drink alcohol and does not use drugs.   Family History:  His family history includes Diabetes in his mother; Heart attack in his father; Hypertension in his father and mother; Stroke in his father.   Allergies No Known Allergies   Home Medications  Prior to Admission medications   Medication Sig Start Date End Date Taking? Authorizing Provider  aspirin EC 81 MG tablet Take 81 mg by mouth daily.    [provider]  atorvastatin (LIPITOR) 40 MG tablet atorvastatin 40 mg tablet    [provider]  BYETTA 10 MCG PEN 10 MCG/0.04ML SOPN injection Inject 10 mcg into the skin 2 (two) times daily with a meal. 12/26/22   [provider]  carvedilol (COREG) 25 MG tablet Take 1 tablet (25 mg total) by mouth 2 (two) times daily with a meal. 02/26/23   Arnetha Courser, MD  clopidogrel (PLAVIX) 75 MG tablet Take 1 tablet (75 mg total) by mouth daily for 21 days. 02/27/23 03/20/23  Arnetha Courser, MD  empagliflozin (JARDIANCE) 25 MG TABS tablet Take by mouth daily. Once daily in the am    [provider]  Fe Fum-Vit C-Vit B12-FA (TRIGELS-F FORTE) CAPS capsule Take 1 capsule by mouth 2 (two) times daily.  02/26/23   Arnetha Courser, MD  gabapentin (NEURONTIN) 300 MG capsule Take 300 mg by mouth 3 (three) times daily.    [provider]  insulin aspart (NOVOLOG) 100 UNIT/ML injection Inject into the skin 3 (three) times daily before meals.    [provider]  isosorbide mononitrate (IMDUR) 60 MG 24 hr tablet Take 1 tablet (60 mg total) by mouth at bedtime. 02/26/23   Arnetha Courser, MD  LANTUS 100 UNIT/ML injection Inject 90 Units into the skin 2 (two) times daily. 04/28/22   [provider]  metFORMIN (GLUCOPHAGE) 1000 MG tablet Take 1,000 mg by mouth 2 (two) times daily with a meal.    [provider]  Multiple Vitamins-Minerals (CENTRUM SILVER 50+MEN) TABS Take 1 tablet by mouth daily.    [provider]  OXYGEN Inhale 2 L into the lungs daily.    [provider]  pantoprazole (PROTONIX) 40 MG tablet Take 40 mg by mouth daily.    [provider]  polyethylene glycol (MIRALAX / GLYCOLAX) 17 g packet Take 17 g by mouth daily as needed for mild constipation. 02/26/23   Arnetha Courser, MD  tamsulosin (FLOMAX) 0.4 MG CAPS capsule Take 1 capsule (0.4 mg total) by mouth daily after supper. 02/26/23   Arnetha Courser, MD  torsemide (DEMADEX) 20 MG tablet Take 2 tablets (40 mg total) by mouth daily. 05/01/17   Rai, Delene Ruffini, MD  TRUEPLUS INSULIN SYRINGE 29G X 1/2" 1 ML MISC  02/16/18   [provider]  TRUEPLUS PEN NEEDLES 31G X 8 MM MISC  02/06/18   [provider]  valsartan (DIOVAN) 160 MG tablet Take 160 mg by mouth daily. 07/11/22   [provider]     Critical care time: 31 minutes    Raechel Chute, MD [facility] 03/06/2023 6:39 PM

## 2023-03-06 NOTE — Plan of Care (Signed)
  Problem: Education: Goal: Knowledge of General Education information will improve Description: Including pain rating scale, medication(s)/side effects and non-pharmacologic comfort measures Outcome: Progressing   Problem: Health Behavior/Discharge Planning: Goal: Ability to manage health-related needs will improve Outcome: Progressing   Problem: Clinical Measurements: Goal: Ability to maintain clinical measurements within normal limits will improve Outcome: Progressing Goal: Will remain free from infection Outcome: Progressing Goal: Diagnostic test results will improve Outcome: Progressing Goal: Respiratory complications will improve Outcome: Progressing Goal: Cardiovascular complication will be avoided Outcome: Progressing   Problem: Activity: Goal: Risk for activity intolerance will decrease Outcome: Progressing   Problem: Nutrition: Goal: Adequate nutrition will be maintained Outcome: Progressing   Problem: Coping: Goal: Level of anxiety will decrease Outcome: Progressing   Problem: Elimination: Goal: Will not experience complications related to bowel motility Outcome: Progressing Goal: Will not experience complications related to urinary retention Outcome: Progressing   Problem: Pain Managment: Goal: General experience of comfort will improve and/or be controlled Outcome: Progressing   Problem: Safety: Goal: Ability to remain free from injury will improve Outcome: Progressing   Problem: Skin Integrity: Goal: Risk for impaired skin integrity will decrease Outcome: Progressing   Problem: Coping: Goal: Ability to adjust to condition or change in health will improve Outcome: Progressing   Problem: Fluid Volume: Goal: Ability to maintain a balanced intake and output will improve Outcome: Progressing   Problem: Health Behavior/Discharge Planning: Goal: Ability to identify and utilize available resources and services will improve Outcome: Progressing Goal:  Ability to manage health-related needs will improve Outcome: Progressing   Problem: Nutritional: Goal: Maintenance of adequate nutrition will improve Outcome: Progressing   Problem: Skin Integrity: Goal: Risk for impaired skin integrity will decrease Outcome: Progressing   Problem: Tissue Perfusion: Goal: Adequacy of tissue perfusion will improve Outcome: Progressing   Problem: Education: Goal: Understanding of CV disease, CV risk reduction, and recovery process will improve Outcome: Progressing   Problem: Activity: Goal: Ability to return to baseline activity level will improve Outcome: Progressing   Problem: Cardiovascular: Goal: Ability to achieve and maintain adequate cardiovascular perfusion will improve Outcome: Progressing Goal: Vascular access site(s) Level 0-1 will be maintained Outcome: Progressing   Problem: Health Behavior/Discharge Planning: Goal: Ability to safely manage health-related needs after discharge will improve Outcome: Progressing   Problem: Clinical Measurements: Goal: Ability to avoid or minimize complications of infection will improve Outcome: Progressing   Problem: Skin Integrity: Goal: Skin integrity will improve Outcome: Progressing

## 2023-03-06 NOTE — Progress Notes (Signed)
Attempted to call report to 2A RN. Nurse not available to take report yet. Awaiting call back.

## 2023-03-06 NOTE — Progress Notes (Signed)
Patient ID: Josemanuel Eakins Kittle, male   DOB: 1965-11-07, 58 y.o.   MRN: [id number] [facility], [facility], [facility]    SUBJECTIVE: Resting comfortably in bed improved mental status no chest pain still short of breath denies palpitations tachycardia   Vitals:   03/06/23 0400 03/06/23 0500 03/06/23 0600 03/06/23 0630  BP: (!) 103/59 (!) 111/55 (!) 109/52 (!) 109/55  Pulse: 62 (!) 58 60 63  Resp: 19 18 19 18   Temp: 99.1 F (37.3 C)     TempSrc: Axillary     SpO2: 95% 96% 97% 98%  Weight:  (!) 186.6 kg    Height:         Intake/Output Summary (Last 24 hours) at 03/06/2023 0720 Last data filed at 03/06/2023 0000 Gross per 24 hour  Intake 130.61 ml  Output 1550 ml  Net -1419.39 ml      PHYSICAL EXAM  General: Well developed, well nourished, in no acute distress HEENT:  Normocephalic and atramatic Neck:  No JVD.  Lungs: Clear bilaterally to auscultation and percussion. Heart: HRRR . Normal S1 and S2 without gallops or murmurs.  Abdomen: Bowel sounds are positive, abdomen soft and non-tender  Msk:  Back normal, normal gait. Normal strength and tone for age. Extremities: No clubbing, cyanosis or edema.   Neuro: Alert and oriented X 3. Psych:  Good affect, responds appropriately   LABS: Basic Metabolic Panel: Recent Labs    03/05/23 0211 03/06/23 0512  NA 136 137  K 4.3 4.9  CL 99 104  CO2 26 27  GLUCOSE 209* 201*  BUN 45* 49*  CREATININE 1.98* 1.61*  CALCIUM 8.0* 8.1*  MG 2.6* 2.5*  PHOS 6.1* 5.3*   Liver Function Tests: Recent Labs    03/03/23 1915  AST 15  ALT 19  ALKPHOS 55  BILITOT 0.6  PROT 6.8  ALBUMIN 3.2*   No results for input(s): "LIPASE", "AMYLASE" in the last 72 hours. CBC: Recent Labs    03/03/23 1915 03/04/23 0547 03/05/23 0211 03/06/23 0512  WBC 13.1*   < > 9.0 6.2  NEUTROABS 9.6*  --   --   --   HGB 10.6*   < > 9.5* 9.2*  HCT 38.3*   < > 33.7* 32.6*  MCV 78.2*   < > 75.7* 75.8*  PLT 387   < > 364 298   < > = values in this interval not  displayed.   Cardiac Enzymes: No results for input(s): "CKTOTAL", "CKMB", "CKMBINDEX", "TROPONINI" in the last 72 hours. BNP: Invalid input(s): "POCBNP" D-Dimer: No results for input(s): "DDIMER" in the last 72 hours. Hemoglobin A1C: No results for input(s): "HGBA1C" in the last 72 hours. Fasting Lipid Panel: No results for input(s): "CHOL", "HDL", "LDLCALC", "TRIG", "CHOLHDL", "LDLDIRECT" in the last 72 hours. Thyroid Function Tests: No results for input(s): "TSH", "T4TOTAL", "T3FREE", "THYROIDAB" in the last 72 hours.  Invalid input(s): "FREET3" Anemia Panel: No results for input(s): "VITAMINB12", "FOLATE", "FERRITIN", "TIBC", "IRON", "RETICCTPCT" in the last 72 hours.  ECHOCARDIOGRAM LIMITED Result Date: 03/05/2023    ECHOCARDIOGRAM LIMITED REPORT   Patient Name:   DARELD MCAULIFFE [facility] Date of Exam: 03/05/2023 Medical Rec #:  [id number]                   Height:       74.0 in Accession #:    [id number]                  Weight:       412.3  lb Date of Birth:  [birth year]                   BSA:          2.956 m Patient Age:    57 years                    BP:           119/55 mmHg Patient Gender: M                           HR:           60 bpm. Exam Location:  [facility] Procedure: Limited Echo and Intracardiac Opacification Agent Indications:     Acute myocardial infarction I21.9  History:         Patient has prior history of Echocardiogram examinations, most                  recent 02/15/2023.  Sonographer:     Overton Mam RDCS, FASE Referring Phys:  [id number] Lyn Hollingshead PARASCHOS Diagnosing Phys: Marcina Millard MD  Sonographer Comments: Technically challenging study due to limited acoustic windows, suboptimal apical window, no subcostal window and patient is obese. Image acquisition challenging due to patient body habitus. A Limited Echo was requested by Cardiology  to re-evaluate left ventricular function. IMPRESSIONS  1. Left ventricular ejection fraction, by estimation, is 55 to 60%. The  left ventricle has normal function. The left ventricle has no regional wall motion abnormalities. FINDINGS  Left Ventricle: Left ventricular ejection fraction, by estimation, is 55 to 60%. The left ventricle has normal function. The left ventricle has no regional wall motion abnormalities. Definity contrast agent was given IV to delineate the left ventricular  endocardial borders. LEFT VENTRICLE PLAX 2D LVIDd:         5.60 cm LVIDs:         3.80 cm LV PW:         1.30 cm LV IVS:        1.30 cm  LV Volumes (MOD) LV vol d, MOD A4C: 130.0 ml LV vol s, MOD A4C: 46.6 ml LV SV MOD A4C:     130.0 ml LEFT ATRIUM         Index LA diam:    4.40 cm 1.49 cm/m Marcina Millard MD Electronically signed by Marcina Millard MD Signature Date/Time: 03/05/2023/10:35:12 AM    Final      Echo 12 overall left ventricular function EF 55%  TELEMETRY: Normal sinus rhythm with 60:  ASSESSMENT AND PLAN:  Principal Problem:   STEMI (ST elevation myocardial infarction) (HCC) Active Problems:   Chronic diastolic heart failure (HCC)   HTN (hypertension)   AKI (acute kidney injury) (HCC)   Morbid obesity with BMI of 50.0-59.9, adult (HCC)   Essential hypertension   Type 2 diabetes mellitus without complications (HCC)   Iron deficiency anemia   COPD (chronic obstructive pulmonary disease) (HCC)   Stroke (HCC)   Slurred speech    Plan Acute on chronic hypoxic hypercapnic respiratory failure continue supplemental therapy inhalers supplemental oxygen diuretics Acute decompensated heart failure recommend BiPAP diuretics Toxic metabolic encephalopathy improving continue diuretic therapy supplemental oxygen correct electrolytes Demand ischemia negative cath recommend conservative therapy History of CVA continue current therapy agree with neurology input Check to sleep apnea continue CPAP BiPAP weight loss follow-up with pulmonary Diabetes type 2 recommend significant weight loss continue diabetes management with  insulin  therapy Continue conservative cardiac input   Alwyn Pea, MD,  03/06/2023 7:20 AM

## 2023-03-07 DIAGNOSIS — I959 Hypotension, unspecified: Secondary | ICD-10-CM

## 2023-03-07 LAB — GLUCOSE, CAPILLARY
Glucose-Capillary: 196 mg/dL — ABNORMAL HIGH (ref 70–99)
Glucose-Capillary: 209 mg/dL — ABNORMAL HIGH (ref 70–99)
Glucose-Capillary: 231 mg/dL — ABNORMAL HIGH (ref 70–99)
Glucose-Capillary: 243 mg/dL — ABNORMAL HIGH (ref 70–99)
Glucose-Capillary: 278 mg/dL — ABNORMAL HIGH (ref 70–99)

## 2023-03-07 MED ORDER — ISOSORBIDE MONONITRATE ER 60 MG PO TB24
60.0000 mg | ORAL_TABLET | Freq: Every day | ORAL | Status: DC
  Administered 2023-03-07 – 2023-03-12 (×6): 60 mg via ORAL
  Filled 2023-03-07 (×6): qty 1

## 2023-03-07 MED ORDER — HYDRALAZINE HCL 20 MG/ML IJ SOLN
10.0000 mg | Freq: Four times a day (QID) | INTRAMUSCULAR | Status: DC | PRN
  Administered 2023-03-07 – 2023-03-08 (×2): 10 mg via INTRAVENOUS
  Filled 2023-03-07 (×2): qty 1

## 2023-03-07 MED ORDER — INSULIN ASPART 100 UNIT/ML IJ SOLN
8.0000 [IU] | Freq: Three times a day (TID) | INTRAMUSCULAR | Status: DC
  Administered 2023-03-07 – 2023-03-10 (×8): 8 [IU] via SUBCUTANEOUS
  Filled 2023-03-07 (×8): qty 1

## 2023-03-07 NOTE — Progress Notes (Signed)
 [facility]    SUBJECTIVE: Patient resting comfortably in bed denies any pain no fever no worsening symptoms states that he is improving slowly   Vitals:   03/07/23 0751 03/07/23 0900 03/07/23 1133 03/07/23 1604  BP: (!) 144/66  (!) 154/68 (!) 174/71  Pulse: 61  71 65  Resp:   20 19  Temp:   98.1 F (36.7 C) 97.6 F (36.4 C)  TempSrc:      SpO2: 98%  97% 99%  Weight:  (!) 180.8 kg    Height:         Intake/Output Summary (Last 24 hours) at 03/07/2023 8147 Last data filed at 03/07/2023 1606 Gross per 24 hour  Intake 240 ml  Output 2000 ml  Net -1760 ml      PHYSICAL EXAM  General: Well developed, well nourished, in no acute distress morbid obesity HEENT:  Normocephalic and atramatic Neck:  No JVD.  Lungs: Clear bilaterally to auscultation and percussion. Heart: HRRR . Normal S1 and S2 without gallops or murmurs.  Abdomen: Bowel sounds are positive, abdomen soft and non-tender  Msk:  Back normal, normal gait. Normal strength and tone for age. Extremities: No clubbing, cyanosis or edema.   Neuro: Alert and oriented X 3. Psych:  Good affect, responds appropriately   LABS: Basic Metabolic Panel: Recent Labs    03/05/23 0211 03/06/23 0512  NA 136 137  K 4.3 4.9  CL 99 104  CO2 26 27  GLUCOSE 209* 201*  BUN 45* 49*  CREATININE 1.98* 1.61*  CALCIUM  8.0* 8.1*  MG 2.6* 2.5*  PHOS 6.1* 5.3*   Liver Function Tests: No results for input(s): AST, ALT, ALKPHOS, BILITOT, PROT, ALBUMIN  in the last 72 hours. No results for input(s): LIPASE, AMYLASE in the last 72 hours. CBC: Recent Labs    03/05/23 0211 03/06/23 0512  WBC 9.0 6.2  HGB 9.5* 9.2*  HCT 33.7* 32.6*  MCV 75.7* 75.8*  PLT 364 298   Cardiac Enzymes: No results for input(s): CKTOTAL, CKMB, CKMBINDEX, TROPONINI in the last 72 hours. BNP: Invalid input(s): POCBNP D-Dimer: No results for input(s): DDIMER in the last 72 hours. Hemoglobin A1C: No results for input(s):  HGBA1C in the last 72 hours. Fasting Lipid Panel: No results for input(s): CHOL, HDL, LDLCALC, TRIG, CHOLHDL, LDLDIRECT in the last 72 hours. Thyroid Function Tests: No results for input(s): TSH, T4TOTAL, T3FREE, THYROIDAB in the last 72 hours.  Invalid input(s): FREET3 Anemia Panel: No results for input(s): VITAMINB12, FOLATE, FERRITIN, TIBC, IRON , RETICCTPCT in the last 72 hours.  No results found.   Echo preserved left ventricular function EF of 55  TELEMETRY: Normal sinus rhythm rate of 75:  ASSESSMENT AND PLAN:  Principal Problem:   STEMI (ST elevation myocardial infarction) (HCC) Active Problems:   Chronic diastolic heart failure (HCC)   HTN (hypertension)   AKI (acute kidney injury) (HCC)   Morbid obesity with BMI of 50.0-59.9, adult (HCC)   Essential hypertension   Type 2 diabetes mellitus without complications (HCC)   Iron  deficiency anemia   COPD (chronic obstructive pulmonary disease) (HCC)   Stroke (HCC)   Slurred speech    Plan Morbid obesity resulting in respiratory failure probable obstructive sleep apnea recommend CPAP BiPAP to help with restoration support Acute on chronic hypoxic hypercapnic respiratory failure continue supplemental therapy inhalers supplemental oxygen  diuretics Acute decompensated heart failure recommend BiPAP diuretics Toxic metabolic encephalopathy improving continue diuretic therapy supplemental oxygen  correct electrolytes Demand ischemia negative cath recommend conservative therapy History  of CVA continue current therapy agree with neurology input Check to sleep apnea continue CPAP BiPAP weight loss follow-up with pulmonary Diabetes type 2 recommend significant weight loss continue diabetes management with insulin  therapy Lower extremity edema lymphedema chronic persistent recommend support stocking wrapping and diuretics No further invasive cardiac procedures recommended   Cara JONETTA Lovelace,  MD,  03/07/2023 6:52 PM

## 2023-03-07 NOTE — Plan of Care (Signed)
  Problem: Clinical Measurements: Goal: Ability to maintain clinical measurements within normal limits will improve Outcome: Progressing Goal: Will remain free from infection Outcome: Progressing Goal: Diagnostic test results will improve Outcome: Progressing Goal: Cardiovascular complication will be avoided Outcome: Progressing   Problem: Activity: Goal: Risk for activity intolerance will decrease Outcome: Progressing   Problem: Nutrition: Goal: Adequate nutrition will be maintained Outcome: Progressing   Problem: Coping: Goal: Level of anxiety will decrease Outcome: Progressing   Problem: Elimination: Goal: Will not experience complications related to bowel motility Outcome: Progressing Goal: Will not experience complications related to urinary retention Outcome: Progressing   Problem: Pain Managment: Goal: General experience of comfort will improve and/or be controlled Outcome: Progressing   Problem: Safety: Goal: Ability to remain free from injury will improve Outcome: Progressing   Problem: Skin Integrity: Goal: Risk for impaired skin integrity will decrease Outcome: Progressing   Problem: Coping: Goal: Ability to adjust to condition or change in health will improve Outcome: Progressing   Problem: Fluid Volume: Goal: Ability to maintain a balanced intake and output will improve Outcome: Progressing   Problem: Health Behavior/Discharge Planning: Goal: Ability to manage health-related needs will improve Outcome: Progressing   Problem: Nutritional: Goal: Maintenance of adequate nutrition will improve Outcome: Progressing   Problem: Skin Integrity: Goal: Risk for impaired skin integrity will decrease Outcome: Progressing

## 2023-03-07 NOTE — Care Management Important Message (Signed)
Important Message  Patient Details  Name: Raymond [name] MRN: [id number] Date of Birth: 1965-04-02   Important Message Given:  Yes - Medicare IM     Raymond [name], CMA 03/07/2023, 9:22 AM

## 2023-03-07 NOTE — Inpatient Diabetes Management (Signed)
Inpatient Diabetes Program Recommendations  AACE/ADA: New Consensus Statement on Inpatient Glycemic Control (2015)  Target Ranges:  Prepandial:   less than 140 mg/dL      Peak postprandial:   less than 180 mg/dL (1-2 hours)      Critically ill patients:  140 - 180 mg/dL   Lab Results  Component Value Date   GLUCAP 278 (H) 03/07/2023   HGBA1C 6.7 (H) 02/14/2023    Latest Reference Range & Units 03/06/23 07:22 03/06/23 11:20 03/06/23 15:21 03/06/23 22:31 03/07/23 07:52 03/07/23 11:33  Glucose-Capillary 70 - 99 mg/dL 213 (H) 086 (H) 578 (H) 263 (H) 196 (H) 278 (H)  (H): Data is abnormally high  Diabetes history: DM  Outpatient Diabetes medications:  Metformin 1000 mg bid Lantus 60 units bid Novolog tid with meals  Current orders for Inpatient glycemic control:  Semglee 20 units bid Novolog 6 units tid meal coverage Novolog 0-20 units tid , 0-5 units hs  Inpatient Diabetes Program Recommendations:   Please consider: -Increase Novolog meal coverage to 8 units tid if eats 50% meals  Thank you, Billy Fischer. Colt Martelle, RN, MSN, CDCES  Diabetes Coordinator Inpatient Glycemic Control Team Team Pager [phone] (8am-5pm) 03/07/2023 1:00 PM

## 2023-03-07 NOTE — Evaluation (Signed)
 Occupational Therapy Evaluation Patient Details Name: Raymond [name] MRN: [id number] DOB: [birth year] Today's Date: 03/07/2023   History of Present Illness 58 y.o Male who presented to the ED with chief complaints of possible infectious process. Initial concern for STEMI without chest pain, LHC with non-obstructive CAD, MRI brain shows evolution of previously noted infarct. PMHx: morbid obesity, chronic  HFpEF, HTN, OSA on CPAP, CAD, T2DM, and recent CVA on 02/14/2023.   Clinical Impression   Pt was seen for PT/OT co-evaluation this date to maximize pt/therapist safety. Pt recently hospitalized for CVA with L sided deficits and returned home and reports mostly sitting in a chair with inability to walk. Prior to that hospitalization, pt was IND with all tasks.    Pt presents to acute OT demonstrating impaired ADL performance and functional mobility 2/2 weakness, ROM deficits, balance deficits, and limited activity tolerance (See OT problem list for additional functional deficits). Pt currently requires Min/Mod A x2 for bed mobility and STS to bariatric RW with bed height elevated to maximize ease. Once standing, pt able to SPT to recliner with CGA x2 for safety. Sp02 with noted drop to 85% on 4L requiring increased time and PLB to return to 90's. LUE ROM remains limited to ~1/4 full AROM in shoulder with mild weakness in L grip strength. He reports mostly using his R non-dominant hand d/t the deficits. FMC/GMC limited in LUE. RUE strength an ROM WFL. Pt fatigued easily with transfer. Pt would benefit from skilled OT services to address noted impairments and functional limitations (see below for any additional details) in order to maximize safety and independence while minimizing falls risk and caregiver burden. Do anticipate the need for follow up OT services upon acute hospital DC.        If plan is discharge home, recommend the following: Two people to help with walking and/or  transfers;Assistance with cooking/housework;Assist for transportation;Help with stairs or ramp for entrance;A lot of help with bathing/dressing/bathroom    Functional Status Assessment  Patient has had a recent decline in their functional status and demonstrates the ability to make significant improvements in function in a reasonable and predictable amount of time.  Equipment Recommendations  Other (comment) (defer to next venue)    Recommendations for Other Services       Precautions / Restrictions Precautions Precautions: Fall Precaution Comments: watch O2 Restrictions Weight Bearing Restrictions Per Provider Order: No      Mobility Bed Mobility Overal bed mobility: Needs Assistance Bed Mobility: Supine to Sit     Supine to sit: Min assist, Used rails, HOB elevated, Mod assist, +2 for physical assistance          Transfers Overall transfer level: Needs assistance   Transfers: Sit to/from Stand, Bed to chair/wheelchair/BSC Sit to Stand: Min assist, Mod assist, +2 physical assistance, From elevated surface     Step pivot transfers: Contact guard assist, +2 safety/equipment     General transfer comment: bed height elevated to maximize ease with transfer from EOB to RW      Balance Overall balance assessment: Needs assistance Sitting-balance support: No upper extremity supported, Feet supported Sitting balance-Leahy Scale: Good Sitting balance - Comments: SUP for seated balance at EOB   Standing balance support: Bilateral upper extremity supported, Reliant on assistive device for balance Standing balance-Leahy Scale: Fair Standing balance comment: reliance on walker but overall does well  ADL either performed or assessed with clinical judgement   ADL Overall ADL's : Needs assistance/impaired                     Lower Body Dressing: Maximal assistance;Bed level Lower Body Dressing Details (indicate cue type and  reason): to don socks Toilet Transfer: Rolling walker (2 wheels);Requires wide/bariatric;Contact guard assist;+2 for safety/equipment Toilet Transfer Details (indicate cue type and reason): simulated to recliner (CGA x2); Min/Mod A x2 from EOB with bed elevated           General ADL Comments: anticipate Mod A for UB ADLs and Max/Total for LB ADLs     Vision         Perception         Praxis         Pertinent Vitals/Pain Pain Assessment Pain Assessment: No/denies pain Pain Intervention(s): Monitored during session     Extremity/Trunk Assessment Upper Extremity Assessment Upper Extremity Assessment: Generalized weakness;Left hand dominant;LUE deficits/detail LUE Deficits / Details: LUE shoulder AROM limited to approx 1/4 full AROM; elbow/wrist/hand appear WFL, will continue to assess; mildly weak grip strength, impaired FMC/coordination LUE: Shoulder pain with ROM LUE Sensation:  (will continue to assess) LUE Coordination: decreased fine motor;decreased gross motor   Lower Extremity Assessment Lower Extremity Assessment: Generalized weakness       Communication Communication Communication: No apparent difficulties Cueing Techniques: Verbal cues;Tactile cues   Cognition Arousal: Alert Behavior During Therapy: WFL for tasks assessed/performed Overall Cognitive Status: Within Functional Limits for tasks assessed                                       General Comments  dropped to 85% following transfer on 4L; improved to 90-95% with time and rest    Exercises Other Exercises Other Exercises: Edu on role of OT in acute setting.   Shoulder Instructions      Home Living Family/patient expects to be discharged to:: Skilled nursing facility Living Arrangements: Spouse/significant other Available Help at Discharge: Family;Available 24 hours/day;Friend(s) (lives with a very good friend) Type of Home: Apartment Home Access: Level entry     Home  Layout: One level     Bathroom Shower/Tub: Tub/shower unit                    Prior Functioning/Environment               Mobility Comments: Pt daughter reports decrease in overall funtion over the last month, amb around the house no AD; since DC home ~1 week ago he has been mostly in a chair he says ADLs Comments: Pt reports he was MOD I with ADL, assist with IADL (does not drive) but finger nails are extremely long, has a long beard indicating potential difficulities with ADLs        OT Problem List: Decreased strength;Decreased range of motion;Decreased activity tolerance;Decreased coordination;Impaired balance (sitting and/or standing);Impaired vision/perception;Decreased knowledge of precautions;Decreased safety awareness;Decreased knowledge of use of DME or AE;Cardiopulmonary status limiting activity;Impaired UE functional use      OT Treatment/Interventions: Self-care/ADL training;Therapeutic exercise;Patient/family education;Neuromuscular education;Balance training;Therapeutic activities;Energy conservation;DME and/or AE instruction;Cognitive remediation/compensation    OT Goals(Current goals can be found in the care plan section) Acute Rehab OT Goals Patient Stated Goal: improve strength OT Goal Formulation: With patient Time For Goal Achievement: 03/21/23 Potential to Achieve Goals: Good ADL Goals Pt Will  Perform Grooming: with set-up;sitting Pt Will Perform Upper Body Bathing: with min assist;sitting;with contact guard assist Pt Will Perform Lower Body Bathing: with min assist;sit to/from stand;sitting/lateral leans;with mod assist Pt Will Perform Lower Body Dressing: with mod assist;sitting/lateral leans;sit to/from stand;with min assist Pt Will Transfer to Toilet: bedside commode;with contact guard assist;stand pivot transfer Pt Will Perform Toileting - Clothing Manipulation and hygiene: with mod assist;sitting/lateral leans;sit to/from stand  OT Frequency:  Min 1X/week    Co-evaluation PT/OT/SLP Co-Evaluation/Treatment: Yes Reason for Co-Treatment: For patient/therapist safety;To address functional/ADL transfers PT goals addressed during session: Mobility/safety with mobility;Proper use of DME;Balance OT goals addressed during session: ADL's and self-care;Proper use of Adaptive equipment and DME      AM-PAC OT 6 Clicks Daily Activity     Outcome Measure Help from another person eating meals?: None Help from another person taking care of personal grooming?: A Little Help from another person toileting, which includes using toliet, bedpan, or urinal?: A Lot Help from another person bathing (including washing, rinsing, drying)?: A Lot Help from another person to put on and taking off regular upper body clothing?: A Little Help from another person to put on and taking off regular lower body clothing?: A Lot 6 Click Score: 16   End of Session Equipment Utilized During Treatment: Oxygen ;Rolling walker (2 wheels) Nurse Communication: Mobility status  Activity Tolerance: Patient tolerated treatment well Patient left: with call bell/phone within reach;in chair;with chair alarm set;with nursing/sitter in room  OT Visit Diagnosis: Unsteadiness on feet (R26.81);Muscle weakness (generalized) (M62.81);Other abnormalities of gait and mobility (R26.89);Other symptoms and signs involving the nervous system (R29.898);Hemiplegia and hemiparesis Hemiplegia - Right/Left: Left Hemiplegia - dominant/non-dominant: Dominant                Time: 9050-8992 OT Time Calculation (min): 18 min Charges:  OT General Charges $OT Visit: 1 Visit OT Evaluation $OT Eval Moderate Complexity: 1 Mod Khyleigh Furney, OTR/L 03/07/23, 10:31 AM  Catlin Aycock E Ivory Maduro 03/07/2023, 10:28 AM

## 2023-03-07 NOTE — Progress Notes (Signed)
 PROGRESS NOTE    Raymond [name]  FMW:[id number] DOB: 1965/12/30 DOA: 03/03/2023 PCP: Jacques Garre, NP  [id number]  LOS: 4 days   Brief hospital course: 58 y.o African American Male with significant PMH of morbid obesity, chronic  HFpEF, HTN, OSA on CPAP, CAD, T2DM, and recent CVA on 02/14/2023 who presented to the ED with chief complaints of possible infectious process.   On review of chart, patient was recently hospitalized on 02/14/2023 with CVA deemed not a candidate for thrombolytics due to being outside of the window period. He was discharged on 1/27.    He was sent to the ED by family who noted he had not been eating, drinking, or urinating well. He reported that he ran out of his home oxygen .  He was also noted with slurred speech and hypotension.  On EMS arrival patient was hypotensive so was given 500 cc bolus.  Initial EKG was concerning for potential STEMI however patient had no chest pain or shortness of breath.  patient taken to Cath Lab for emergent cath which which did not reveal obstructive coronary artery disease.     Assessment & Plan:  #Acute on Chronic Hypoxic Hypercapnic Respiratory Failure secondary to  #Acute on chronic systolic heart failure #OSA on CPAP -on BiPAP on presentation, now weaned to 4L [place] (recent home requirement)  -Hold diuretic in the setting of AKI and hypotension   Hypotension --unclear etiology.  Weaned off levophed  on 03/05/23.  Now hypertensive.  HTN --cont coreg  --resume home Imdur  --hold home torsemide  and valsartan due to AKI  #Inferior STEMI, ruled out #Cardiogenic shock, ruled out --heart cath which which did not reveal obstructive coronary artery disease.    Trop elevation 2/2 demand ischemia  #HFpEF prior EF 50-55% with new reduced systolic function with LVEF 450% on 03/03/23 --cont coreg   #Leukocytosis  --empiric abx d/c'ed after no infection found   #AKI  --Cr peaked at 3.44, improved to 1.61.  #T2DM   Diabetic Neuropathy  Recent A1c 6.7.  --cont glargine 20u BID --increase mealtime to 8u TID --ACHS and SSI   #Acute Metabolic Encephalopathy  --mental back to baseline  Acute infarct Recent CVA --MRI brain showed Punctate acute infarct in the left occipital cortex since MRI 2 weeks prior. --cont ASA, plavix  and statin   DVT prophylaxis: Lovenox  SQ Code Status: Full code  Family Communication:  Level of care: Progressive Dispo:   The patient is from: home Anticipated d/c is to: SNF rehab Anticipated d/c date is: whenever bed available   Subjective and Interval History:  Pt reported good oral intake and having BM.  Some leg swelling.   Objective: Vitals:   03/07/23 0900 03/07/23 1133 03/07/23 1604 03/07/23 2057  BP:  (!) 154/68 (!) 174/71 (!) 155/61  Pulse:  71 65 67  Resp:  20 19 20   Temp:  98.1 F (36.7 C) 97.6 F (36.4 C) 98.2 F (36.8 C)  TempSrc:      SpO2:  97% 99% 100%  Weight: (!) 180.8 kg     Height:        Intake/Output Summary (Last 24 hours) at 03/07/2023 2122 Last data filed at 03/07/2023 2121 Gross per 24 hour  Intake 240 ml  Output 1400 ml  Net -1160 ml   Filed Weights   03/04/23 0500 03/06/23 0500 03/07/23 0900  Weight: (!) 187 kg (!) 186.6 kg (!) 180.8 kg    Examination:   Constitutional: NAD, AAOx3 HEENT: conjunctivae and lids normal, EOMI CV: No  cyanosis.   RESP: normal respiratory effort, on 4L Extremities: edema in BLE SKIN: warm, dry Neuro: II - XII grossly intact.   Psych: Normal mood and affect.  Appropriate judgement and reason   Data Reviewed: I have personally reviewed labs and imaging studies  Time spent: 50 minutes  Ellouise Haber, MD [facility] If 7PM-7AM, please contact night-coverage 03/07/2023, 9:22 PM

## 2023-03-07 NOTE — Evaluation (Signed)
 Physical Therapy Evaluation Patient Details Name: Raymond [name] MRN: [id number] DOB: 18-Feb-1965 Today's Date: 03/07/2023  History of Present Illness  Patient is a 58 year old male with STEMI s/p LHC, acute on chronic hypoxic respiratory failure. History of morbid obesity, chronic  HFpEF, HTN, OSA on CPAP, CAD, T2DM, and recent CVA  Clinical Impression  Patient is agreeable to PT evaluation. He reports he is coming from home where he has mostly been just sitting in his chair since recent discharge from the hospital.  Today the patient required +2 person for bed mobility. He needed elevated bed height for standing with +2 person required using bariatric rolling walker. Patient completed step transfer from bed to recliner chair with CGA. Standing activity tolerance limited by fatigue. Sp02 down to 85% with mobility on 4 L02, increasing to 90's with seated rest break. Recommend to continue PT to maximize independence and facilitate return to prior level of function. Consider rehabilitation < 3 hours/day after this hospital stay.     If plan is discharge home, recommend the following: Two people to help with walking and/or transfers;A lot of help with bathing/dressing/bathroom;Assistance with cooking/housework;Help with stairs or ramp for entrance   Can travel by private vehicle   No    Equipment Recommendations None recommended by PT  Recommendations for Other Services       Functional Status Assessment Patient has had a recent decline in their functional status and demonstrates the ability to make significant improvements in function in a reasonable and predictable amount of time.     Precautions / Restrictions Precautions Precautions: Fall Precaution Comments: watch O2 Restrictions Weight Bearing Restrictions Per Provider Order: No      Mobility  Bed Mobility Overal bed mobility: Needs Assistance Bed Mobility: Supine to Sit     Supine to sit: Mod assist, Min assist,  +2 for physical assistance     General bed mobility comments: cues for technique    Transfers Overall transfer level: Needs assistance Equipment used: Rolling walker (2 wheels) Transfers: Sit to/from Stand Sit to Stand: Min assist, Mod assist, +2 physical assistance, From elevated surface   Step pivot transfers: Contact guard assist, +2 safety/equipment       General transfer comment: Min cues for technique. patient able to stand from elevated bed with +2 person assistance and complete step transfer to the recliner with CGA. patient fatigued with activity and unable to progress ambluation at this time. Sp02 down to 85% on 4 L02 initially after transfer to chair, increasing to 90's with rest break.    Ambulation/Gait               General Gait Details: not attempted due to fatigue with standing  Stairs            Wheelchair Mobility     Tilt Bed    Modified Rankin (Stroke Patients Only)       Balance Overall balance assessment: Needs assistance Sitting-balance support: No upper extremity supported, Feet supported Sitting balance-Leahy Scale: Good     Standing balance support: Bilateral upper extremity supported, Reliant on assistive device for balance Standing balance-Leahy Scale: Fair Standing balance comment: needs rolling walker for support in standing                             Pertinent Vitals/Pain Pain Assessment Pain Assessment: No/denies pain    Home Living Family/patient expects to be discharged to:: Private residence Living Arrangements:  Spouse/significant other Available Help at Discharge: Family;Available 24 hours/day;Friend(s) Type of Home: Apartment Home Access: Level entry       Home Layout: One level Home Equipment: Agricultural Consultant (2 wheels);BSC/3in1 (oxygen )      Prior Function Prior Level of Function : Needs assist             Mobility Comments: patient reports he has been mostly in the chair since recent  d/c home, no walking. presumably assist required ADLs Comments: presumably recent assistance required     Extremity/Trunk Assessment   Upper Extremity Assessment Upper Extremity Assessment: Defer to OT evaluation LUE Deficits / Details: LUE shoulder AROM limited to approx 1/4 full AROM; elbow/wrist/hand appear WFL, will continue to assess; mildly weak grip strength, impaired FMC/coordination LUE: Shoulder pain with ROM LUE Sensation:  (will continue to assess) LUE Coordination: decreased fine motor;decreased gross motor    Lower Extremity Assessment Lower Extremity Assessment: Generalized weakness       Communication   Communication Communication: No apparent difficulties Cueing Techniques: Verbal cues;Tactile cues  Cognition Arousal: Alert Behavior During Therapy: WFL for tasks assessed/performed Overall Cognitive Status: Within Functional Limits for tasks assessed                                 General Comments: mild delay with verbal responses. otherwise grossly [facility]        General Comments General comments (skin integrity, edema, etc.): patient requesting to sit up in chair. nurse and tech in the room during mobility    Exercises     Assessment/Plan    PT Assessment Patient needs continued PT services  PT Problem List Decreased strength;Decreased range of motion;Decreased activity tolerance;Decreased balance;Decreased mobility;Cardiopulmonary status limiting activity       PT Treatment Interventions DME instruction;Gait training;Stair training;Functional mobility training;Therapeutic activities;Therapeutic exercise;Balance training;Neuromuscular re-education;Cognitive remediation;Wheelchair mobility training;Patient/family education    PT Goals (Current goals can be found in the Care Plan section)  Acute Rehab PT Goals Patient Stated Goal: to get better PT Goal Formulation: With patient Time For Goal Achievement: 03/21/23 Potential to Achieve  Goals: Fair    Frequency Min 1X/week     Co-evaluation PT/OT/SLP Co-Evaluation/Treatment: Yes Reason for Co-Treatment: For patient/therapist safety;To address functional/ADL transfers PT goals addressed during session: Mobility/safety with mobility;Proper use of DME;Balance OT goals addressed during session: ADL's and self-care;Proper use of Adaptive equipment and DME       AM-PAC PT 6 Clicks Mobility  Outcome Measure Help needed turning from your back to your side while in a flat bed without using bedrails?: A Lot Help needed moving from lying on your back to sitting on the side of a flat bed without using bedrails?: Total Help needed moving to and from a bed to a chair (including a wheelchair)?: Total Help needed standing up from a chair using your arms (e.g., wheelchair or bedside chair)?: Total Help needed to walk in hospital room?: Total Help needed climbing 3-5 steps with a railing? : Total 6 Click Score: 7    End of Session Equipment Utilized During Treatment: Oxygen  Activity Tolerance: Patient tolerated treatment well Patient left: in chair;with call bell/phone within reach;with nursing/sitter in room Nurse Communication: Mobility status PT Visit Diagnosis: Unsteadiness on feet (R26.81);Muscle weakness (generalized) (M62.81)    Time: 9052-8995 PT Time Calculation (min) (ACUTE ONLY): 17 min   Charges:   PT Evaluation $PT Eval Moderate Complexity: 1 Mod   PT General Charges $$  ACUTE PT VISIT: 1 Visit         Randine Essex, PT, MPT   Randine LULLA Essex 03/07/2023, 11:57 AM

## 2023-03-07 NOTE — Plan of Care (Signed)

## 2023-03-08 DIAGNOSIS — I10 Essential (primary) hypertension: Secondary | ICD-10-CM | POA: Diagnosis not present

## 2023-03-08 DIAGNOSIS — I5032 Chronic diastolic (congestive) heart failure: Secondary | ICD-10-CM

## 2023-03-08 DIAGNOSIS — D509 Iron deficiency anemia, unspecified: Secondary | ICD-10-CM | POA: Diagnosis not present

## 2023-03-08 LAB — CULTURE, BLOOD (ROUTINE X 2)
Culture: NO GROWTH
Culture: NO GROWTH
Special Requests: ADEQUATE
Special Requests: ADEQUATE

## 2023-03-08 LAB — BASIC METABOLIC PANEL
Anion gap: 6 (ref 5–15)
BUN: 27 mg/dL — ABNORMAL HIGH (ref 6–20)
CO2: 28 mmol/L (ref 22–32)
Calcium: 8.7 mg/dL — ABNORMAL LOW (ref 8.9–10.3)
Chloride: 106 mmol/L (ref 98–111)
Creatinine, Ser: 0.85 mg/dL (ref 0.61–1.24)
GFR, Estimated: >60 mL/min (ref >60)
Glucose, Bld: 179 mg/dL — ABNORMAL HIGH (ref 70–99)
Potassium: 4.9 mmol/L (ref 3.5–5.1)
Sodium: 140 mmol/L (ref 135–145)

## 2023-03-08 LAB — CBC
HCT: 31.7 % — ABNORMAL LOW (ref 39.0–52.0)
Hemoglobin: 8.9 g/dL — ABNORMAL LOW (ref 13.0–17.0)
MCH: 21.3 pg — ABNORMAL LOW (ref 26.0–34.0)
MCHC: 28.1 g/dL — ABNORMAL LOW (ref 30.0–36.0)
MCV: 75.8 fL — ABNORMAL LOW (ref 80.0–100.0)
Platelets: 314 10*3/uL (ref 150–400)
RBC: 4.18 MIL/uL — ABNORMAL LOW (ref 4.22–5.81)
RDW: 18.5 % — ABNORMAL HIGH (ref 11.5–15.5)
WBC: 5 10*3/uL (ref 4.0–10.5)
nRBC: 0 % (ref 0.0–0.2)

## 2023-03-08 LAB — GLUCOSE, CAPILLARY
Glucose-Capillary: 175 mg/dL — ABNORMAL HIGH (ref 70–99)
Glucose-Capillary: 183 mg/dL — ABNORMAL HIGH (ref 70–99)
Glucose-Capillary: 241 mg/dL — ABNORMAL HIGH (ref 70–99)
Glucose-Capillary: 258 mg/dL — ABNORMAL HIGH (ref 70–99)
Glucose-Capillary: 210 mg/dL — ABNORMAL HIGH (ref 70–99)

## 2023-03-08 LAB — MAGNESIUM: Magnesium: 2.4 mg/dL (ref 1.7–2.4)

## 2023-03-08 NOTE — Progress Notes (Signed)
 [facility] Cardiology    SUBJECTIVE: Patient resting comfortably in bed no pain still has some lower extremity swelling no nausea vomiting or diarrhea no palpitations or tachycardia denies chest pain   Vitals:   03/07/23 2354 03/08/23 0502 03/08/23 0823 03/08/23 1245  BP: (!) 143/60 139/70 (!) 143/67 (!) 163/75  Pulse: (!) 59 65 (!) 57 72  Resp: (!) 22 20 20 18   Temp: 98.4 F (36.9 C) 98.5 F (36.9 C) 98.7 F (37.1 C) 98.3 F (36.8 C)  TempSrc:  Oral Oral Oral  SpO2: 100% 99% 99% 99%  Weight:  (!) 198.6 kg    Height:         Intake/Output Summary (Last 24 hours) at 03/08/2023 1354 Last data filed at 03/08/2023 1244 Gross per 24 hour  Intake 480 ml  Output 2200 ml  Net -1720 ml      PHYSICAL EXAM  General: Well developed, well nourished, in no acute distress HEENT:  Normocephalic and atramatic Neck:  No JVD.  Lungs: Clear bilaterally to auscultation and percussion. Heart: HRRR . Normal S1 and S2 without gallops or murmurs.  Abdomen: Bowel sounds are positive, abdomen soft and non-tender  Msk:  Back normal, normal gait. Normal strength and tone for age. Extremities: No clubbing, cyanosis or edema.   Neuro: Alert and oriented X 3. Psych:  Good affect, responds appropriately   LABS: Basic Metabolic Panel: Recent Labs    03/06/23 0512 03/08/23 0537  NA 137 140  K 4.9 4.9  CL 104 106  CO2 27 28  GLUCOSE 201* 179*  BUN 49* 27*  CREATININE 1.61* 0.85  CALCIUM  8.1* 8.7*  MG 2.5* 2.4  PHOS 5.3*  --    Liver Function Tests: No results for input(s): AST, ALT, ALKPHOS, BILITOT, PROT, ALBUMIN  in the last 72 hours. No results for input(s): LIPASE, AMYLASE in the last 72 hours. CBC: Recent Labs    03/06/23 0512 03/08/23 0537  WBC 6.2 5.0  HGB 9.2* 8.9*  HCT 32.6* 31.7*  MCV 75.8* 75.8*  PLT 298 314   Cardiac Enzymes: No results for input(s): CKTOTAL, CKMB, CKMBINDEX, TROPONINI in the last 72 hours. BNP: Invalid input(s):  POCBNP D-Dimer: No results for input(s): DDIMER in the last 72 hours. Hemoglobin A1C: No results for input(s): HGBA1C in the last 72 hours. Fasting Lipid Panel: No results for input(s): CHOL, HDL, LDLCALC, TRIG, CHOLHDL, LDLDIRECT in the last 72 hours. Thyroid Function Tests: No results for input(s): TSH, T4TOTAL, T3FREE, THYROIDAB in the last 72 hours.  Invalid input(s): FREET3 Anemia Panel: No results for input(s): VITAMINB12, FOLATE, FERRITIN, TIBC, IRON , RETICCTPCT in the last 72 hours.  No results found.   Echo preserved overall left ventricular function EF of at least 55%  TELEMETRY: Normal sinus rhythm rate of 64 nonspecific ST-T wave changes low voltage:  ASSESSMENT AND PLAN:  Principal Problem:   STEMI (ST elevation myocardial infarction) (HCC) Active Problems:   Chronic diastolic heart failure (HCC)   HTN (hypertension)   AKI (acute kidney injury) (HCC)   Morbid obesity with BMI of 50.0-59.9, adult (HCC)   Essential hypertension   Type 2 diabetes mellitus without complications (HCC)   Iron  deficiency anemia   COPD (chronic obstructive pulmonary disease) (HCC)   Stroke (HCC)   Slurred speech    Plan Acute on chronic hypoxic hypercapnic respiratory failure secondary to congestive heart failure obstructive sleep apnea obesity continue supportive care inhalers supplemental oxygen  CPAP BiPAP Hypertension somewhat improved weaned off of pressors now slightly hypertensive  recommend medical therapy valsartan Imdur  torsemide  Initial presentation thought to be cardiogenic shock cath revealed no obstructive coronary disease symptoms mainly related to demand ischemia and heart failure preserved systolic function Morbid obesity recommend CPAP BiPAP significant weight loss Diabetes type 2 uncomplicated continue current therapy insulin  coverage as necessary History of recent CVA occipital cortex secondary to MRI continue aspirin  Plavix   statin therapy Continue conservative cardiac input at this stage     Raymond JONETTA Lovelace, MD 03/08/2023 1:54 PM

## 2023-03-08 NOTE — Progress Notes (Signed)
 PROGRESS NOTE    Raymond [name]  FMW:[id number] DOB: [birth year] DOA: 03/03/2023 PCP: Raymond Garre, NP  [id number]  LOS: 5 days   Brief hospital course: 58 y.o African American Male with significant PMH of morbid obesity, chronic  HFpEF, HTN, OSA on CPAP, CAD, T2DM, and recent CVA on 02/14/2023 who presented to the ED with chief complaints of possible infectious process.   On review of chart, patient was recently hospitalized on 02/14/2023 with CVA deemed not a candidate for thrombolytics due to being outside of the window period. He was discharged on 1/27.    He was sent to the ED by family who noted he had not been eating, drinking, or urinating well. He reported that he ran out of his home oxygen .  He was also noted with slurred speech and hypotension.  On EMS arrival patient was hypotensive so was given 500 cc bolus.  Initial EKG was concerning for potential STEMI however patient had no chest pain or shortness of breath.  patient taken to Cath Lab for emergent cath which which did not reveal obstructive coronary artery disease.     Assessment & Plan:  #Acute on Chronic Hypoxic Hypercapnic Respiratory Failure secondary to  #Acute on chronic systolic heart failure #OSA on CPAP -on BiPAP on presentation, now weaned to 4L [place] (recent home requirement)  -Hold diuretic in the setting of AKI and hypotension   Hypotension --unclear etiology.  Weaned off levophed  on 03/05/23.  Now hypertensive.  HTN --cont coreg  --resume home Imdur  --hold home torsemide  and valsartan due to AKI  #Inferior STEMI, ruled out #Cardiogenic shock, ruled out --heart cath which which did not reveal obstructive coronary artery disease.    Trop elevation 2/2 demand ischemia  #HFpEF prior EF 50-55% with new reduced systolic function with LVEF 450% on 03/03/23 --cont coreg   #Leukocytosis  --empiric abx d/c'ed after no infection found   #AKI  --Cr peaked at 3.44, improved to 1.61.  #T2DM   Diabetic Neuropathy  Recent A1c 6.7.  --cont glargine 20u BID --increase mealtime to 8u TID --ACHS and SSI   #Acute Metabolic Encephalopathy  --mental back to baseline  Acute infarct Recent CVA --MRI brain showed Punctate acute infarct in the left occipital cortex since MRI 2 weeks prior. --cont ASA, plavix  and statin   DVT prophylaxis: Lovenox  SQ Code Status: Full code   Family Communication: cousin at bedside on rounds 2/5. Pt is capable to update family.     Dispo:   The patient is from: home Anticipated d/c is to: SNF rehab  Anticipated d/c date is: PER TOC  Medically stable   Subjective and Interval History:  Pt seen with cousin at bedside this AM. He denies complaints or issues. Hopes to get discharged soon.  Agreeable to rehab.   Objective: Vitals:   03/07/23 2354 03/08/23 0502 03/08/23 0823 03/08/23 1245  BP: (!) 143/60 139/70 (!) 143/67 (!) 163/75  Pulse: (!) 59 65 (!) 57 72  Resp: (!) 22 20 20 18   Temp: 98.4 F (36.9 C) 98.5 F (36.9 C) 98.7 F (37.1 C) 98.3 F (36.8 C)  TempSrc:  Oral Oral Oral  SpO2: 100% 99% 99% 99%  Weight:  (!) 198.6 kg    Height:        Intake/Output Summary (Last 24 hours) at 03/08/2023 1341 Last data filed at 03/08/2023 1244 Gross per 24 hour  Intake 480 ml  Output 2200 ml  Net -1720 ml   Filed Weights   03/06/23 0500 03/07/23  0900 03/08/23 0502  Weight: (!) 186.6 kg (!) 180.8 kg (!) 198.6 kg    Examination:   General exam: awake, alert, no acute distress, obese HEENT: moist mucus membranes, hearing grossly normal  Respiratory system: CTAB diminished due to body habitus, no wheezes or rhonchi, normal respiratory effort. On 4 L/min HF [place] O2 Cardiovascular system: normal S1/S2, RRR, no pedal edema.   Gastrointestinal system: soft, NT, ND, no HSM felt, +bowel sounds. Central nervous system: A&O x3. no gross focal neurologic deficits, normal speech Extremities: moves all, no edema, normal tone Skin: dry, intact,  normal temperature Psychiatry: normal mood, congruent affect, judgement and insight appear normal    Data Reviewed: I have personally reviewed labs and imaging studies  Notable labs -- glucose 179, Ca 8.7, BUN 27 Hbg 8.9  Time spent: 36 minutes  Burnard DELENA Cunning, DO [facility] If 7PM-7AM, please contact night-coverage 03/08/2023, 1:41 PM

## 2023-03-08 NOTE — Plan of Care (Signed)

## 2023-03-08 NOTE — NC FL2 (Signed)
   MEDICAID FL2 LEVEL OF CARE FORM     IDENTIFICATION  Patient Name: Raymond [name] Birthdate: 1965-07-04 Sex: male Admission Date (Current Location): 03/03/2023  [facility] and [place] Number:  Chiropodist and Address:  [facility], [address] [place], [place] [address]      Provider Number: [id number]  Attending Physician Name and Address:  Fausto Burnard LABOR, DO  Relative Name and Phone Number:  Marinda Bong (Sister)  [phone] [facility])    Current Level of Care: Hospital Recommended Level of Care: Skilled Nursing Facility Prior Approval Number:    Date Approved/Denied:   PASRR Number: [id number]  Discharge Plan: SNF    Current Diagnoses: Patient Active Problem List   Diagnosis Date Noted   STEMI (ST elevation myocardial infarction) (HCC) 03/03/2023   NSTEMI (non-ST elevated myocardial infarction) (HCC) 03/03/2023   COPD (chronic obstructive pulmonary disease) (HCC) 03/03/2023   Stroke (HCC) 03/03/2023   Slurred speech 03/03/2023   Left-sided weakness 02/26/2023   Expressive aphasia 02/26/2023   Pressure injury of skin 02/18/2023   Iron  deficiency anemia 02/18/2023   Acute respiratory failure with hypoxia and hypercapnia (HCC) 02/18/2023   Pharyngeal dysphagia 02/15/2023   Acute ischemic stroke (HCC) 02/14/2023   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   Dysarthria due to acute cerebrovascular accident (CVA) (HCC) 02/14/2023   Acute on chronic heart failure with preserved ejection fraction (HFpEF) (HCC) 09/09/2022   Essential hypertension 09/09/2022   Type 2 diabetes mellitus without complications (HCC) 09/09/2022   Dyslipidemia 09/09/2022   GERD without esophagitis 09/09/2022   Adenomatous polyp of colon 12/27/2021   Uncontrolled type 2 diabetes mellitus with hyperglycemia, without long-term current use of insulin  (HCC) 02/08/2020   Acute on chronic respiratory failure with  hypoxia and hypercapnia (HCC) - suppose to have home Trilogy machine since March 2015 04/28/2017   AKI (acute kidney injury) (HCC) 04/28/2017   PVD (peripheral vascular disease) (HCC) 04/28/2017   Diabetes mellitus type 2, insulin  dependent (HCC) 04/28/2017   Chronic diastolic heart failure, NYHA class 1 (HCC) 04/28/2017   Morbid obesity with BMI of 50.0-59.9, adult (HCC) 04/28/2017   Lymphedema 02/11/2017   HTN (hypertension) 02/01/2017   Type 2 diabetes mellitus (HCC) 02/01/2017   COPD, moderate (HCC) 07/07/2014   Chronic diastolic heart failure (HCC) 05/06/2014   OSA treated with Trilogy machine 04/28/2014   Hypoxemia requiring supplemental oxygen  04/28/2014   Morbid obesity (HCC) 04/28/2014   Hyperlipidemia 07/09/2013    Orientation RESPIRATION BLADDER Height & Weight     Self, Time, Situation, Place  O2 (4L / BIPAP) External catheter, Incontinent Weight: (!) 198.6 kg Height:  6' 2 (188 cm)  BEHAVIORAL SYMPTOMS/MOOD NEUROLOGICAL BOWEL NUTRITION STATUS  Other (Comment) (n/a)  (n/a) Continent Diet (Heart Healthy/ Carb Modified)  AMBULATORY STATUS COMMUNICATION OF NEEDS Skin   Limited Assist Verbally Normal                       Personal Care Assistance Level of Assistance  Bathing, Feeding, Dressing Bathing Assistance: Limited assistance Feeding assistance: Limited assistance Dressing Assistance: Limited assistance     Functional Limitations Info  Sight, Hearing, Speech Sight Info: Adequate Hearing Info: Adequate Speech Info: Adequate    SPECIAL CARE FACTORS FREQUENCY  PT (By licensed PT), OT (By licensed OT)     PT Frequency: Min 2x weekly OT Frequency: Min 2x weekly            Contractures Contractures Info: Not  present    Additional Factors Info  Code Status, Allergies Code Status Info: DNR Allergies Info: No Known Allergies           Current Medications (03/08/2023):  This is the current hospital active medication list Current  Facility-Administered Medications  Medication Dose Route Frequency Provider Last Rate Last Admin   acetaminophen  (TYLENOL ) tablet 650 mg  650 mg Oral Q4H PRN Isadora Hose, MD       aspirin  EC tablet 81 mg  81 mg Oral Daily Dgayli, Hose, MD   81 mg at 03/08/23 0858   atorvastatin  (LIPITOR) tablet 40 mg  40 mg Oral Daily Isadora Hose, MD   40 mg at 03/08/23 0859   carvedilol  (COREG ) tablet 12.5 mg  12.5 mg Oral BID WC Isadora Hose, MD   12.5 mg at 03/08/23 0859   Chlorhexidine  Gluconate Cloth 2 % PADS 6 each  6 each Topical Daily Isadora Hose, MD   6 each at 03/08/23 0900   clopidogrel  (PLAVIX ) tablet 75 mg  75 mg Oral Daily Isadora Hose, MD   75 mg at 03/08/23 0859   docusate sodium  (COLACE) capsule 100 mg  100 mg Oral BID PRN Isadora Hose, MD       enoxaparin  (LOVENOX ) injection 90 mg  90 mg Subcutaneous Q24H Dgayli, Hose, MD   90 mg at 03/07/23 2200   hydrALAZINE  (APRESOLINE ) injection 10 mg  10 mg Intravenous Q6H PRN [place], MD   10 mg at 03/07/23 1851   insulin  aspart (novoLOG ) injection 0-20 Units  0-20 Units Subcutaneous TID WC Keene, Jeremiah D, NP   4 Units at 03/08/23 9140   insulin  aspart (novoLOG ) injection 0-5 Units  0-5 Units Subcutaneous QHS Keene, Jeremiah D, NP   2 Units at 03/07/23 2201   insulin  aspart (novoLOG ) injection 8 Units  8 Units Subcutaneous TID WC Awanda City, MD   8 Units at 03/08/23 9140   insulin  glargine-yfgn (SEMGLEE ) injection 20 Units  20 Units Subcutaneous BID Isadora Hose, MD   20 Units at 03/08/23 9140   isosorbide  mononitrate (IMDUR ) 24 hr tablet 60 mg  60 mg Oral QHS Awanda City, MD   60 mg at 03/07/23 2159   ondansetron  (ZOFRAN ) injection 4 mg  4 mg Intravenous Q6H PRN Isadora Hose, MD       oxyCODONE -acetaminophen  (PERCOCET/ROXICET) 5-325 MG per tablet 1 tablet  1 tablet Oral Q4H PRN Isadora Hose, MD       pantoprazole  (PROTONIX ) EC tablet 40 mg  40 mg Oral Daily Dgayli, Hose, MD   40 mg at 03/08/23 0858   polyethylene glycol  (MIRALAX  / GLYCOLAX ) packet 17 g  17 g Oral Daily PRN Isadora Hose, MD       sodium chloride  flush (NS) 0.9 % injection 3 mL  3 mL Intravenous PRN Isadora Hose, MD       tamsulosin  (FLOMAX ) capsule 0.4 mg  0.4 mg Oral QPC supper Isadora Hose, MD   0.4 mg at 03/07/23 1708     Discharge Medications: Please see discharge summary for a list of discharge medications.  Relevant Imaging Results:  Relevant Lab Results:   Additional Information SS#: [id number]  Ladale Sherburn C Everlie Eble, RN

## 2023-03-08 NOTE — TOC Progression Note (Signed)
 Transition of Care [facility]) - Progression Note    Patient Details  Name: Raymond [name] MRN: [id number] Date of Birth: 1965/08/11  [facility]) CM/SW Contact  [name], RN Phone Number: 03/08/2023, 11:05 AM  Clinical Narrative:    FL2 completed.         Expected Discharge Plan and Services                                               Social Determinants of Health (SDOH) Interventions SDOH Screenings   Food Insecurity: No Food Insecurity (03/04/2023)  Housing: Low Risk  (03/04/2023)  Transportation Needs: No Transportation Needs (03/04/2023)  Utilities: Not At Risk (03/04/2023)  Tobacco Use: Low Risk  (02/14/2023)    Readmission Risk Interventions     No data to display

## 2023-03-08 NOTE — Plan of Care (Signed)
  Problem: Clinical Measurements: Goal: Ability to maintain clinical measurements within normal limits will improve Outcome: Progressing   Problem: Elimination: Goal: Will not experience complications related to bowel motility Outcome: Progressing   Problem: Coping: Goal: Ability to adjust to condition or change in health will improve Outcome: Progressing

## 2023-03-09 DIAGNOSIS — D509 Iron deficiency anemia, unspecified: Secondary | ICD-10-CM | POA: Diagnosis not present

## 2023-03-09 DIAGNOSIS — I5032 Chronic diastolic (congestive) heart failure: Secondary | ICD-10-CM | POA: Diagnosis not present

## 2023-03-09 DIAGNOSIS — I10 Essential (primary) hypertension: Secondary | ICD-10-CM | POA: Diagnosis not present

## 2023-03-09 LAB — CBC
HCT: 32.4 % — ABNORMAL LOW (ref 39.0–52.0)
Hemoglobin: 9.1 g/dL — ABNORMAL LOW (ref 13.0–17.0)
MCH: 21.3 pg — ABNORMAL LOW (ref 26.0–34.0)
MCHC: 28.1 g/dL — ABNORMAL LOW (ref 30.0–36.0)
MCV: 75.7 fL — ABNORMAL LOW (ref 80.0–100.0)
Platelets: 305 10*3/uL (ref 150–400)
RBC: 4.28 MIL/uL (ref 4.22–5.81)
RDW: 18.4 % — ABNORMAL HIGH (ref 11.5–15.5)
WBC: 5.1 10*3/uL (ref 4.0–10.5)
nRBC: 0 % (ref 0.0–0.2)

## 2023-03-09 LAB — BASIC METABOLIC PANEL
Anion gap: 2 — ABNORMAL LOW (ref 5–15)
BUN: 21 mg/dL — ABNORMAL HIGH (ref 6–20)
CO2: 33 mmol/L — ABNORMAL HIGH (ref 22–32)
Calcium: 8.4 mg/dL — ABNORMAL LOW (ref 8.9–10.3)
Chloride: 107 mmol/L (ref 98–111)
Creatinine, Ser: 0.84 mg/dL (ref 0.61–1.24)
GFR, Estimated: >60 mL/min (ref >60)
Glucose, Bld: 169 mg/dL — ABNORMAL HIGH (ref 70–99)
Potassium: 5.1 mmol/L (ref 3.5–5.1)
Sodium: 142 mmol/L (ref 135–145)

## 2023-03-09 LAB — GLUCOSE, CAPILLARY
Glucose-Capillary: 145 mg/dL — ABNORMAL HIGH (ref 70–99)
Glucose-Capillary: 141 mg/dL — ABNORMAL HIGH (ref 70–99)
Glucose-Capillary: 212 mg/dL — ABNORMAL HIGH (ref 70–99)
Glucose-Capillary: 215 mg/dL — ABNORMAL HIGH (ref 70–99)
Glucose-Capillary: 220 mg/dL — ABNORMAL HIGH (ref 70–99)

## 2023-03-09 LAB — MAGNESIUM: Magnesium: 2.2 mg/dL (ref 1.7–2.4)

## 2023-03-09 MED ORDER — IRBESARTAN 150 MG PO TABS
75.0000 mg | ORAL_TABLET | Freq: Every day | ORAL | Status: DC
  Administered 2023-03-09 – 2023-03-13 (×5): 75 mg via ORAL
  Filled 2023-03-09 (×5): qty 1

## 2023-03-09 MED ORDER — TORSEMIDE 20 MG PO TABS
40.0000 mg | ORAL_TABLET | Freq: Every day | ORAL | Status: DC
  Administered 2023-03-09 – 2023-03-13 (×5): 40 mg via ORAL
  Filled 2023-03-09 (×5): qty 2

## 2023-03-09 NOTE — Progress Notes (Signed)
 Physical Therapy Treatment Patient Details Name: Raymond [name] MRN: [id number] DOB: 11-03-1965 Today's Date: 03/09/2023   History of Present Illness Patient is a 58 year old male with STEMI s/p LHC, acute on chronic hypoxic respiratory failure. History of morbid obesity, chronic  HFpEF, HTN, OSA on CPAP, CAD, T2DM, and recent CVA    PT Comments  Patient agreeable to PT session and wants to try standing. Increased independence with mobility today. Patient able to stand x 2 bouts for 20 seconds. Bed mobility and standing performed with assistance of one person today which is an improvement from previous session. PT will continue to follow to maximize independence and to decrease caregiver burden. Recommend rehabilitation < 3 hours/day after this hospital stay.    If plan is discharge home, recommend the following: Two people to help with walking and/or transfers;A lot of help with bathing/dressing/bathroom;Assistance with cooking/housework;Help with stairs or ramp for entrance   Can travel by private vehicle     No  Equipment Recommendations  None recommended by PT    Recommendations for Other Services       Precautions / Restrictions Precautions Precautions: Fall Restrictions Weight Bearing Restrictions Per Provider Order: No     Mobility  Bed Mobility Overal bed mobility: Needs Assistance Bed Mobility: Supine to Sit, Sit to Supine     Supine to sit: Min assist, HOB elevated, Used rails Sit to supine: Contact guard assist, Used rails   General bed mobility comments: assistance for trunk support to sit upright. cues for sequencing. increased time required    Transfers Overall transfer level: Needs assistance Equipment used: Rolling walker (2 wheels) Transfers: Sit to/from Stand Sit to Stand: Contact guard assist, From elevated surface, Min assist           General transfer comment: cues for anterior weight shifting. 2 standing bouts performed with Min A  required on the first stand and CGA on the second stand.    Ambulation/Gait               General Gait Details: not attempted due to limited standing tolerance   Stairs             Wheelchair Mobility     Tilt Bed    Modified Rankin (Stroke Patients Only)       Balance Overall balance assessment: Needs assistance Sitting-balance support: No upper extremity supported, Feet supported Sitting balance-Leahy Scale: Good     Standing balance support: Bilateral upper extremity supported, Reliant on assistive device for balance Standing balance-Leahy Scale: Fair Standing balance comment: needs rolling walker for support in standing                            Cognition Arousal: Alert Behavior During Therapy: WFL for tasks assessed/performed Overall Cognitive Status: Within Functional Limits for tasks assessed                                          Exercises      General Comments General comments (skin integrity, edema, etc.): standing tolerance limited to ~ 20 seconds with each standing bout, no loss of balance      Pertinent Vitals/Pain Pain Assessment Pain Assessment: No/denies pain    Home Living  Prior Function            PT Goals (current goals can now be found in the care plan section) Acute Rehab PT Goals Patient Stated Goal: to get better PT Goal Formulation: With patient Time For Goal Achievement: 03/21/23 Potential to Achieve Goals: Fair Progress towards PT goals: Progressing toward goals    Frequency    Min 1X/week      PT Plan      Co-evaluation              AM-PAC PT 6 Clicks Mobility   Outcome Measure  Help needed turning from your back to your side while in a flat bed without using bedrails?: A Lot Help needed moving from lying on your back to sitting on the side of a flat bed without using bedrails?: A Lot Help needed moving to and from a bed to a  chair (including a wheelchair)?: A Lot Help needed standing up from a chair using your arms (e.g., wheelchair or bedside chair)?: A Lot Help needed to walk in hospital room?: Total Help needed climbing 3-5 steps with a railing? : Total 6 Click Score: 10    End of Session Equipment Utilized During Treatment: Oxygen  Activity Tolerance: Patient tolerated treatment well Patient left: in bed;with call bell/phone within reach Nurse Communication: Mobility status PT Visit Diagnosis: Unsteadiness on feet (R26.81);Muscle weakness (generalized) (M62.81)     Time: 9055-8994 PT Time Calculation (min) (ACUTE ONLY): 21 min  Charges:    $Therapeutic Activity: 8-22 mins PT General Charges $$ ACUTE PT VISIT: 1 Visit                    Randine Essex, PT, MPT    Randine LULLA Essex 03/09/2023, 10:09 AM

## 2023-03-09 NOTE — TOC Progression Note (Signed)
 Transition of Care [facility]) - Progression Note    Patient Details  Name: Raymond [name] MRN: [id number] Date of Birth: 1965-07-03  Transition of Care [facility]) CM/SW Contact  Raymond Brinton [name], RN Phone Number: 03/09/2023, 3:45 PM  Clinical Narrative:    Spoke with patient at bedside regarding therapy's recommendation for SNF. He is agreeable as long as the facility is in [facility]. If one is not located in [facility] he will go home.   Bed search started.         Expected Discharge Plan and Services                                               Social Determinants of Health (SDOH) Interventions SDOH Screenings   Food Insecurity: No Food Insecurity (03/04/2023)  Housing: Low Risk  (03/04/2023)  Transportation Needs: No Transportation Needs (03/04/2023)  Utilities: Not At Risk (03/04/2023)  Tobacco Use: Low Risk  (02/14/2023)    Readmission Risk Interventions     No data to display

## 2023-03-09 NOTE — Progress Notes (Signed)
 [facility] CLINIC CARDIOLOGY PROGRESS NOTE   [name] MRN: [id number] DOB/AGE: 1965/11/29 58 y.o.  Admit date: 03/03/2023 Referring Physician Dr. Ronal Lewandowsky Primary Physician Jacques Garre, NP  Primary Cardiologist Alan Mam, PA Reason for Consultation possible STEMI  HPI: Raymond [name] is a 58 y.o. male with a past medical history of chronic diastolic heart failure, hypertension, cardiomegaly, COPD, OSA on CPAP, hyperlipidemia who presented to the ED on 03/03/2023 for infection.  EKG was concerning for possible STEMI and he was taken to the cardiac Cath Lab.  Interval History:  -Patient seen and examined this morning, reports that he is feeling okay overall. -States that shortness of breath is stable, denies any chest pain symptoms. -BP remains elevated.  Heart rate remained stable.  Review of systems complete and found to be negative unless listed above    Vitals:   03/08/23 1800 03/08/23 2142 03/09/23 0344 03/09/23 0826  BP: (!) 154/65 (!) 161/61 (!) 148/75 (!) (P) 154/70  Pulse:  64 (!) 58 (P) 70  Resp:    (P) 20  Temp:  98.7 F (37.1 C) 98.1 F (36.7 C) (P) 98.1 F (36.7 C)  TempSrc:  Oral Oral (P) Axillary  SpO2:  98% 100% (P) 95%  Weight:   (!) 198.2 kg   Height:         Intake/Output Summary (Last 24 hours) at 03/09/2023 1106 Last data filed at 03/09/2023 0915 Gross per 24 hour  Intake 1080 ml  Output 3800 ml  Net -2720 ml     PHYSICAL EXAM General: Chronically ill-appearing male, well nourished, in no acute distress. HEENT: Normocephalic and atraumatic. Neck: No JVD.  Lungs: Normal respiratory effort on 4L [place]. Clear bilaterally to auscultation. No wheezes, crackles, rhonchi.  Heart: HRRR. Normal S1 and S2 without gallops or murmurs. Radial & DP pulses 2+ bilaterally. Abdomen: Non-distended appearing.  Msk: Normal strength and tone for age. Extremities: No clubbing, cyanosis or edema.   Neuro: Alert and  oriented X 3. Psych: Mood appropriate, affect congruent.    LABS: Basic Metabolic Panel: Recent Labs    03/08/23 0537 03/09/23 0621  NA 140 142  K 4.9 5.1  CL 106 107  CO2 28 33*  GLUCOSE 179* 169*  BUN 27* 21*  CREATININE 0.85 0.84  CALCIUM  8.7* 8.4*  MG 2.4 2.2   Liver Function Tests: No results for input(s): AST, ALT, ALKPHOS, BILITOT, PROT, ALBUMIN  in the last 72 hours. No results for input(s): LIPASE, AMYLASE in the last 72 hours. CBC: Recent Labs    03/08/23 0537 03/09/23 0621  WBC 5.0 5.1  HGB 8.9* 9.1*  HCT 31.7* 32.4*  MCV 75.8* 75.7*  PLT 314 305   Cardiac Enzymes: No results for input(s): CKTOTAL, CKMB, CKMBINDEX, TROPONINIHS in the last 72 hours. BNP: No results for input(s): BNP in the last 72 hours. D-Dimer: No results for input(s): DDIMER in the last 72 hours. Hemoglobin A1C: No results for input(s): HGBA1C in the last 72 hours. Fasting Lipid Panel: No results for input(s): CHOL, HDL, LDLCALC, TRIG, CHOLHDL, LDLDIRECT in the last 72 hours. Thyroid Function Tests: No results for input(s): TSH, T4TOTAL, T3FREE, THYROIDAB in the last 72 hours.  Invalid input(s): FREET3 Anemia Panel: No results for input(s): VITAMINB12, FOLATE, FERRITIN, TIBC, IRON , RETICCTPCT in the last 72 hours.  No results found.   ECHO 03/05/23: 1. Left ventricular ejection fraction, by estimation, is 55 to 60%. The  left ventricle has normal function. The left ventricle has no  regional  wall motion abnormalities.   TELEMETRY reviewed by me 03/09/23: Sinus rhythm PACs rate 60s  EKG reviewed by me 03/09/23: Sinus rhythm rate 70 BPM, questionable inferior ST elevation  DATA reviewed by me 03/09/23: last 24h vitals tele labs imaging I/O, hospitalist progress note  Principal Problem:   STEMI (ST elevation myocardial infarction) (HCC) Active Problems:   Chronic diastolic heart failure (HCC)   HTN  (hypertension)   AKI (acute kidney injury) (HCC)   Morbid obesity with BMI of 50.0-59.9, adult (HCC)   Essential hypertension   Type 2 diabetes mellitus without complications (HCC)   Iron  deficiency anemia   COPD (chronic obstructive pulmonary disease) (HCC)   Stroke (HCC)   Slurred speech    ASSESSMENT AND PLAN: Raymond [name] is a 58 y.o. male with a past medical history of chronic diastolic heart failure, hypertension, cardiomegaly, COPD, OSA on CPAP, hyperlipidemia who presented to the ED on 03/03/2023 for infection.  EKG was concerning for possible STEMI and he was taken to the cardiac Cath Lab.  # NSTEMI, type II # Chronic diastolic heart failure # Recent CVA Patient presented for concern for infection found to have possible ST elevation on EKG taken emergently to the Cath Lab.  LHC 1/31 revealed normal coronaries with mildly reduced EF of 45% with apical hypokinesis.  Echo this admission revealed preserved EF of 55-60% and no wall motion abnormalities. -Continue atorvastatin  40 mg daily, aspirin  81 mg daily, Plavix  75 mg daily. -Continue irbesartan  75 mg daily, Imdur  60 mg daily, carvedilol  12.5 mg twice daily. -Resume home torsemide  40 mg daily.  # AKI Creatinine elevated at 3.44 on admission now normalized at 0.84. -Continue to monitor renal function closely  Cardiology will sign off. Please haiku with questions or re-engage if needed.    This patient's case was discussed and created with Dr. Florencio and he is in agreement.  Signed:  Danita Bloch, PA-C  03/09/2023, 11:06 AM [facility]

## 2023-03-09 NOTE — Progress Notes (Addendum)
 PROGRESS NOTE    Raymond [name]  FMW:[id number] DOB: 08/25/1965 DOA: 03/03/2023 PCP: Jacques Garre, NP  242A/[id number]  LOS: 6 days   Brief hospital course: 58 y.o African American Male with significant PMH of morbid obesity, chronic  HFpEF, HTN, OSA on CPAP, CAD, T2DM, CKD stage II, and recent CVA on 02/14/2023 who presented to the ED with chief complaints of possible infectious process.   On review of chart, patient was recently hospitalized on 02/14/2023 with CVA deemed not a candidate for thrombolytics due to being outside of the window period. He was discharged on 1/27.    He was sent to the ED by family who noted he had not been eating, drinking, or urinating well. He reported that he ran out of his home oxygen .  He was also noted with slurred speech and hypotension.  On EMS arrival patient was hypotensive so was given 500 cc bolus.  Initial EKG was concerning for potential STEMI however patient had no chest pain or shortness of breath.  patient taken to Cath Lab for emergent cath which which did not reveal obstructive coronary artery disease.     Assessment & Plan:  #Acute on Chronic Hypoxic Hypercapnic Respiratory Failure secondary to  #Acute on chronic systolic heart failure #OSA on CPAP -on BiPAP on presentation, now weaned to 4L [place] (recent home requirement)  -Hold diuretic in the setting of AKI and hypotension   #Hypotension --unclear etiology.  Weaned off levophed  on 03/05/23.  Now hypertensive.  #HTN --cont coreg  --resume home Imdur  --resume ARB and torsemide  - held for Aki initially  #Inferior STEMI, ruled out #Cardiogenic shock, ruled out --heart cath which which did not reveal obstructive coronary artery disease.    Trop elevation 2/2 demand ischemia  #HFpEF prior EF 50-55% with new reduced systolic function with LVEF 450% on 03/03/23 --cont coreg   #Leukocytosis  --empiric abx d/c'ed after no infection found   #AKI - resolved #CKD stage II -  --Cr  peaked at 3.44 >>> 0.84 normal --Resume ARB and torsemide  that were held   #T2DM  Diabetic Neuropathy  Recent A1c 6.7.  --cont glargine 20u BID --mealtime to 8u TID --ACHS and SSI   #Acute Metabolic Encephalopathy  --mental back to baseline  Acute infarct Recent CVA --MRI brain showed Punctate acute infarct in the left occipital cortex since MRI 2 weeks prior. --cont ASA, plavix  and statin   DVT prophylaxis: Lovenox  SQ Code Status: Full code   Family Communication: cousin at bedside on rounds 2/5. Pt is capable to update family.     Dispo:   The patient is from: home Anticipated d/c is to: SNF rehab.  Medically sable for d/c.  Anticipated d/c date is: PER TOC - stable for d/c when SNF bed ready   Medically stable   Subjective and Interval History:  Pt sleeping, woke up briefly.  No acute complaints.    Objective: Vitals:   03/08/23 2142 03/09/23 0344 03/09/23 0826 03/09/23 1113  BP: (!) 161/61 (!) 148/75 (!) 154/70 (!) 154/75  Pulse: 64 (!) 58 70 74  Resp:   20 12  Temp: 98.7 F (37.1 C) 98.1 F (36.7 C) 98.1 F (36.7 C) 98.7 F (37.1 C)  TempSrc: Oral Oral Axillary Oral  SpO2: 98% 100% 95% 100%  Weight:  (!) 198.2 kg    Height:        Intake/Output Summary (Last 24 hours) at 03/09/2023 1430 Last data filed at 03/09/2023 1133 Gross per 24 hour  Intake 720  ml  Output 2700 ml  Net -1980 ml   Filed Weights   03/07/23 0900 03/08/23 0502 03/09/23 0344  Weight: (!) 180.8 kg (!) 198.6 kg (!) 198.2 kg    Examination:   General exam: sleeping, wakes briefly, no acute distress, obese HEENT: moist mucus membranes, hearing grossly normal  Respiratory system: CTAB diminished due to body habitus, no wheezes or rhonchi, normal respiratory effort. On 4 L/min HF  O2 Cardiovascular system: normal S1/S2, RRR, no pedal edema.   Gastrointestinal system: soft, NT, ND. Central nervous system: A&O x3. no gross focal neurologic deficits Psychiatry: normal mood,  congruent affect, judgement and insight appear normal    Data Reviewed: I have personally reviewed labs and imaging studies  Notable labs -- glucose 169, Ca 8.4, BUN 33 Hbg 8.9 >> 9.1  Time spent: 36 minutes  Burnard DELENA Cunning, DO [facility] If 7PM-7AM, please contact night-coverage 03/09/2023, 2:30 PM

## 2023-03-09 NOTE — Plan of Care (Signed)

## 2023-03-10 DIAGNOSIS — I10 Essential (primary) hypertension: Secondary | ICD-10-CM | POA: Diagnosis not present

## 2023-03-10 DIAGNOSIS — I5032 Chronic diastolic (congestive) heart failure: Secondary | ICD-10-CM | POA: Diagnosis not present

## 2023-03-10 DIAGNOSIS — E119 Type 2 diabetes mellitus without complications: Secondary | ICD-10-CM | POA: Diagnosis not present

## 2023-03-10 DIAGNOSIS — Z794 Long term (current) use of insulin: Secondary | ICD-10-CM | POA: Diagnosis not present

## 2023-03-10 LAB — GLUCOSE, CAPILLARY
Glucose-Capillary: 169 mg/dL — ABNORMAL HIGH (ref 70–99)
Glucose-Capillary: 211 mg/dL — ABNORMAL HIGH (ref 70–99)
Glucose-Capillary: 211 mg/dL — ABNORMAL HIGH (ref 70–99)
Glucose-Capillary: 227 mg/dL — ABNORMAL HIGH (ref 70–99)

## 2023-03-10 MED ORDER — INSULIN ASPART 100 UNIT/ML IJ SOLN
10.0000 [IU] | Freq: Three times a day (TID) | INTRAMUSCULAR | Status: DC
  Administered 2023-03-10 – 2023-03-11 (×3): 10 [IU] via SUBCUTANEOUS
  Filled 2023-03-10 (×3): qty 1

## 2023-03-10 NOTE — Progress Notes (Deleted)
 PT Cancellation Note  Patient Details Name: Raymond [name] MRN: [id number] DOB: [birth year]   Cancelled Treatment:     PT attempt. Lab tech in room assisting pt/drawing blood?SABRA Acute PT will continue to follow and return at a later time and date.    [name] 03/10/2023, 3:06 PM

## 2023-03-10 NOTE — Progress Notes (Addendum)
 PROGRESS NOTE    Raymond [name]  FMW:[id number] DOB: 04-14-1965 DOA: 03/03/2023 PCP: Jacques Garre, NP  [id number]  LOS: 7 days   Brief hospital course: 58 y.o African American Male with significant PMH of morbid obesity, chronic  HFpEF, HTN, OSA on CPAP, CAD, T2DM, CKD stage II, and recent CVA on 02/14/2023 who presented to the ED with chief complaints of possible infectious process.   On review of chart, patient was recently hospitalized on 02/14/2023 with CVA deemed not a candidate for thrombolytics due to being outside of the window period. He was discharged on 1/27.    He was sent to the ED by family who noted he had not been eating, drinking, or urinating well. He reported that he ran out of his home oxygen .  He was also noted with slurred speech and hypotension.  On EMS arrival patient was hypotensive so was given 500 cc bolus.  Initial EKG was concerning for potential STEMI however patient had no chest pain or shortness of breath.  patient taken to Cath Lab for emergent cath which which did not reveal obstructive coronary artery disease.     Assessment & Plan:  #Acute on Chronic Hypoxic Hypercapnic Respiratory Failure secondary to  #Acute on chronic systolic heart failure #OSA on CPAP -on BiPAP on presentation, now weaned to 4L [place] (recent home requirement)  -Hold diuretic in the setting of AKI and hypotension   #Hypotension --unclear etiology.  Weaned off levophed  on 03/05/23.  Now hypertensive.  #HTN --cont coreg  --resume home Imdur  --resume ARB and torsemide  - held for Aki initially  #Inferior STEMI, ruled out #Cardiogenic shock, ruled out --heart cath which which did not reveal obstructive coronary artery disease.    Trop elevation 2/2 demand ischemia  #HFpEF prior EF 50-55% with new reduced systolic function with LVEF 450% on 03/03/23 --cont coreg   #Leukocytosis  --empiric abx d/c'ed after no infection found   #AKI - resolved #CKD stage II -  --Cr  peaked at 3.44 >>> 0.84 normal --Resume ARB and torsemide  that were held   #T2DM  Diabetic Neuropathy  Recent A1c 6.7.  --cont glargine 20u BID --increase mealtime to 8 >> 10 units TID --ACHS and SSI   #Acute Metabolic Encephalopathy  --mental back to baseline  Acute infarct Recent CVA --MRI brain showed Punctate acute infarct in the left occipital cortex since MRI 2 weeks prior. --cont ASA, plavix  and statin   DVT prophylaxis: Lovenox  SQ Code Status: Full code   Family Communication: cousin at bedside on rounds 2/5. Pt is capable to update family.     Dispo:   The patient is from: home Anticipated d/c is to: SNF rehab.  Medically sable for d/c.  Anticipated d/c date is: PER TOC - stable for d/c when SNF bed ready   Medically stable   Subjective and Interval History:  Pt sleeping when seen this AM. Briefly wakes.  No acute complaints or events reported.   Objective: Vitals:   03/10/23 0412 03/10/23 0530 03/10/23 0847 03/10/23 1208  BP: (!) 112/55  (!) 133/59 (!) 135/56  Pulse: 65  69 69  Resp: (!) 22   20  Temp: 98.9 F (37.2 C)  98.8 F (37.1 C) 98.7 F (37.1 C)  TempSrc:    Oral  SpO2: 100%  96% 94%  Weight:  (!) 193.3 kg    Height:        Intake/Output Summary (Last 24 hours) at 03/10/2023 1319 Last data filed at 03/10/2023 1000 Gross per  24 hour  Intake 720 ml  Output 4600 ml  Net -3880 ml   Filed Weights   03/08/23 0502 03/09/23 0344 03/10/23 0530  Weight: (!) 198.6 kg (!) 198.2 kg (!) 193.3 kg    Examination:   General exam: sleeping, wakes briefly, no acute distress, obese HEENT: moist mucus membranes, hearing grossly normal  Respiratory system: CTAB diminished due to body habitus, no wheezes or rhonchi, normal respiratory effort. On 4 L/min HF [place] O2 Cardiovascular system: normal S1/S2, RRR, no pedal edema.   Gastrointestinal system: soft, NT, ND. Central nervous system: A&O x3. no gross focal neurologic deficits Psychiatry: normal mood,  congruent affect, judgement and insight appear normal    Data Reviewed: I have personally reviewed labs and imaging studies  Notable labs -- glucose 169, Ca 8.4, BUN 33 Hbg 8.9 >> 9.1  Time spent: 35 minutes  Burnard DELENA Cunning, DO [facility] If 7PM-7AM, please contact night-coverage 03/10/2023, 1:19 PM

## 2023-03-10 NOTE — Inpatient Diabetes Management (Signed)
 Inpatient Diabetes Program Recommendations  AACE/ADA: New Consensus Statement on Inpatient Glycemic Control (2015)  Target Ranges:  Prepandial:   less than 140 mg/dL      Peak postprandial:   less than 180 mg/dL (1-2 hours)      Critically ill patients:  140 - 180 mg/dL   Lab Results  Component Value Date   GLUCAP 169 (H) 03/10/2023   HGBA1C 6.7 (H) 02/14/2023    Latest Reference Range & Units 03/09/23 08:22 03/09/23 11:11 03/09/23 16:31 03/09/23 20:06 03/10/23 08:42  Glucose-Capillary 70 - 99 mg/dL 858 (H) 787 (H) 784 (H) 220 (H) 169 (H)  (H): Data is abnormally high  Diabetes history: DM  Outpatient Diabetes medications:  Metformin  1000 mg bid Lantus  60 units bid Novolog  tid with meals  Current orders for Inpatient glycemic control:  Semglee  20 units bid Novolog  8 units tid meal coverage Novolog  0-20 units tid , 0-5 units hs   Inpatient Diabetes Program Recommendations:   Postprandial CBGs remain elevated >180. Please consider: -Increase Novolog  meal coverage to 10 units tid if eats 50% meals  Thank you, Anel Purohit E. Missi Mcmackin, RN, MSN, CDCES  Diabetes Coordinator Inpatient Glycemic Control Team Team Pager [phone] (8am-5pm) 03/10/2023 11:23 AM

## 2023-03-10 NOTE — Plan of Care (Signed)

## 2023-03-10 NOTE — Plan of Care (Signed)
  Problem: Education: Goal: Knowledge of General Education information will improve Description: Including pain rating scale, medication(s)/side effects and non-pharmacologic comfort measures Outcome: Progressing   Problem: Clinical Measurements: Goal: Ability to maintain clinical measurements within normal limits will improve Outcome: Progressing Goal: Will remain free from infection Outcome: Progressing Goal: Cardiovascular complication will be avoided Outcome: Progressing   Problem: Nutrition: Goal: Adequate nutrition will be maintained Outcome: Progressing   Problem: Pain Managment: Goal: General experience of comfort will improve and/or be controlled Outcome: Progressing   Problem: Clinical Measurements: Goal: Respiratory complications will improve Outcome: Not Progressing   Problem: Activity: Goal: Risk for activity intolerance will decrease Outcome: Not Progressing

## 2023-03-10 NOTE — Progress Notes (Signed)
 Occupational Therapy Treatment Patient Details Name: Arty Lantzy MRN: [id number] DOB: February 21, 1965 Today's Date: 03/10/2023   History of present illness Patient is a 58 year old male with STEMI s/p LHC, acute on chronic hypoxic respiratory failure. History of morbid obesity, chronic  HFpEF, HTN, OSA on CPAP, CAD, T2DM, and recent CVA   OT comments  Pt is supine in bed on arrival. Pleasant and agreeable to OT session. He denies pain. Pt performed bed mobility with Min/CGA using bedrail, cueing and increased time/effort. Pt required Mod A from slightly elevated EOB to RW and performed SPT to recliner with Min A x1. Performed oral care seated in recliner with set up assist. Dropped to 86% sp02 with activity, but recovered to 90-93% quickly with PLB. Transferred back to EOB and able to stand whlie peri-care performed with total assist. HR stable throughout.  Pt returned to bed with all needs in place and will cont to require skilled acute OT services to maximize his safety and IND to return to PLOF.       If plan is discharge home, recommend the following:  Assistance with cooking/housework;Assist for transportation;Help with stairs or ramp for entrance;A lot of help with bathing/dressing/bathroom;A lot of help with walking and/or transfers   Equipment Recommendations  Other (comment) (defer)    Recommendations for Other Services      Precautions / Restrictions Precautions Precautions: Fall Restrictions Weight Bearing Restrictions Per Provider Order: No       Mobility Bed Mobility Overal bed mobility: Needs Assistance Bed Mobility: Supine to Sit, Sit to Supine     Supine to sit: Min assist, HOB elevated, Used rails Sit to supine: Contact guard assist, Used rails, Min assist   General bed mobility comments: assistance for trunkal elevation to sit upright, cueing for sequencing, increased time/effort; Min/CGA to return for RLE management    Transfers Overall transfer  level: Needs assistance Equipment used: Rolling walker (2 wheels) (bari) Transfers: Sit to/from Stand, Bed to chair/wheelchair/BSC Sit to Stand: From elevated surface, Min assist, Mod assist     Step pivot transfers: Min assist     General transfer comment: Min/Mod A with bed slightly elevated for STS to RW; Min A x1 for SPT bed<>recliner while linens changed and Min A/CGA for STS from recliner     Balance Overall balance assessment: Needs assistance Sitting-balance support: No upper extremity supported, Feet supported Sitting balance-Leahy Scale: Good     Standing balance support: Bilateral upper extremity supported, Reliant on assistive device for balance Standing balance-Leahy Scale: Fair Standing balance comment: RW use for balance in standing                           ADL either performed or assessed with clinical judgement   ADL Overall ADL's : Needs assistance/impaired     Grooming: Oral care;Wash/dry face;Set up;Sitting Grooming Details (indicate cue type and reason): seated in recliner                 Toilet Transfer: Minimal assistance;Rolling walker (2 wheels) Toilet Transfer Details (indicate cue type and reason): simulated to recliner Min A x1 using bari RW Toileting- Clothing Manipulation and Hygiene: Maximal assistance;Total assistance;Sit to/from stand Toileting - Clothing Manipulation Details (indicate cue type and reason): peri-care in standing at EOB            Extremity/Trunk Assessment              Vision  Perception     Praxis      Cognition Arousal: Alert Behavior During Therapy: WFL for tasks assessed/performed Overall Cognitive Status: Within Functional Limits for tasks assessed                                          Exercises      Shoulder Instructions       General Comments      Pertinent Vitals/ Pain       Pain Assessment Pain Assessment: No/denies pain Pain Intervention(s):  Monitored during session  Home Living                                          Prior Functioning/Environment              Frequency  Min 1X/week        Progress Toward Goals  OT Goals(current goals can now be found in the care plan section)  Progress towards OT goals: Progressing toward goals  Acute Rehab OT Goals Patient Stated Goal: improve strength OT Goal Formulation: With patient Time For Goal Achievement: 03/21/23 Potential to Achieve Goals: Good  Plan      Co-evaluation                 AM-PAC OT 6 Clicks Daily Activity     Outcome Measure   Help from another person eating meals?: None Help from another person taking care of personal grooming?: A Little Help from another person toileting, which includes using toliet, bedpan, or urinal?: A Lot Help from another person bathing (including washing, rinsing, drying)?: A Lot Help from another person to put on and taking off regular upper body clothing?: A Little Help from another person to put on and taking off regular lower body clothing?: A Lot 6 Click Score: 16    End of Session Equipment Utilized During Treatment: Oxygen ;Rolling walker (2 wheels)  OT Visit Diagnosis: Unsteadiness on feet (R26.81);Muscle weakness (generalized) (M62.81);Other abnormalities of gait and mobility (R26.89);Other symptoms and signs involving the nervous system (R29.898);Hemiplegia and hemiparesis Hemiplegia - Right/Left: Left Hemiplegia - dominant/non-dominant: Dominant   Activity Tolerance Patient tolerated treatment well   Patient Left with family/visitor present;in bed;with call bell/phone within reach   Nurse Communication Mobility status        Time: 8640-8562 OT Time Calculation (min): 38 min  Charges: OT General Charges $OT Visit: 1 Visit OT Treatments $Self Care/Home Management : 8-22 mins $Therapeutic Activity: 23-37 mins  Anavictoria Wilk, OTR/L  03/10/23, 3:37 PM   Jesenya Bowditch E  Dalores Weger 03/10/2023, 3:34 PM

## 2023-03-10 NOTE — TOC Progression Note (Signed)
 Transition of Care [facility]) - Progression Note    Patient Details  Name: Raymond [name] MRN: [id number] Date of Birth: 1966-01-09  Transition of Care [facility]) CM/SW Contact  [name], RN Phone Number: 03/10/2023, 4:20 PM  Clinical Narrative:    No current bed offers.          Expected Discharge Plan and Services                                               Social Determinants of Health (SDOH) Interventions SDOH Screenings   Food Insecurity: No Food Insecurity (03/04/2023)  Housing: Low Risk  (03/04/2023)  Transportation Needs: No Transportation Needs (03/04/2023)  Utilities: Not At Risk (03/04/2023)  Tobacco Use: Low Risk  (02/14/2023)    Readmission Risk Interventions     No data to display

## 2023-03-11 DIAGNOSIS — I5032 Chronic diastolic (congestive) heart failure: Secondary | ICD-10-CM | POA: Diagnosis not present

## 2023-03-11 DIAGNOSIS — E119 Type 2 diabetes mellitus without complications: Secondary | ICD-10-CM | POA: Diagnosis not present

## 2023-03-11 DIAGNOSIS — I10 Essential (primary) hypertension: Secondary | ICD-10-CM | POA: Diagnosis not present

## 2023-03-11 DIAGNOSIS — Z794 Long term (current) use of insulin: Secondary | ICD-10-CM | POA: Diagnosis not present

## 2023-03-11 LAB — GLUCOSE, CAPILLARY
Glucose-Capillary: 163 mg/dL — ABNORMAL HIGH (ref 70–99)
Glucose-Capillary: 221 mg/dL — ABNORMAL HIGH (ref 70–99)
Glucose-Capillary: 185 mg/dL — ABNORMAL HIGH (ref 70–99)
Glucose-Capillary: 203 mg/dL — ABNORMAL HIGH (ref 70–99)
Glucose-Capillary: 241 mg/dL — ABNORMAL HIGH (ref 70–99)

## 2023-03-11 MED ORDER — INSULIN ASPART 100 UNIT/ML IJ SOLN
12.0000 [IU] | Freq: Three times a day (TID) | INTRAMUSCULAR | Status: DC
  Administered 2023-03-11 – 2023-03-13 (×6): 12 [IU] via SUBCUTANEOUS
  Filled 2023-03-11 (×5): qty 1

## 2023-03-11 NOTE — Progress Notes (Signed)
 PROGRESS NOTE    Raymond [name]  FMW:[id number] DOB: 1965/07/27 DOA: 03/03/2023 PCP: Jacques Garre, NP  [id number]  LOS: 8 days   Brief hospital course: 58 y.o African American Male with significant PMH of morbid obesity, chronic  HFpEF, HTN, OSA on CPAP, CAD, T2DM, CKD stage II, and recent CVA on 02/14/2023 who presented to the ED with chief complaints of possible infectious process.   On review of chart, patient was recently hospitalized on 02/14/2023 with CVA deemed not a candidate for thrombolytics due to being outside of the window period. He was discharged on 1/27.    He was sent to the ED by family who noted he had not been eating, drinking, or urinating well. He reported that he ran out of his home oxygen .  He was also noted with slurred speech and hypotension.  On EMS arrival patient was hypotensive so was given 500 cc bolus.  Initial EKG was concerning for potential STEMI however patient had no chest pain or shortness of breath.  patient taken to Cath Lab for emergent cath which which did not reveal obstructive coronary artery disease.     Assessment & Plan:  #Acute on Chronic Hypoxic Hypercapnic Respiratory Failure secondary to  #Acute on chronic systolic heart failure #OSA on CPAP -on BiPAP on presentation, now weaned to 4L [place] (recent home requirement)  -Hold diuretic in the setting of AKI and hypotension   #Hypotension --unclear etiology.  Weaned off levophed  on 03/05/23.  Now hypertensive.  #HTN --cont coreg  --resume home Imdur  --resume ARB and torsemide  - held for [facility] initially  #Inferior STEMI, ruled out #Cardiogenic shock, ruled out --heart cath which which did not reveal obstructive coronary artery disease.    Trop elevation 2/2 demand ischemia  #HFpEF prior EF 50-55% with new reduced systolic function with LVEF 450% on 03/03/23 --cont coreg   #Leukocytosis  --empiric abx d/c'ed after no infection found   #AKI - resolved #CKD stage II -  --Cr  peaked at 3.44 >>> 0.84 normal --Resume ARB and torsemide  that were held   #T2DM  Diabetic Neuropathy  Recent A1c 6.7.  2/8 - intermittent CBG's above 180 into the 200's --cont glargine 20u BID --increase mealtime to 10 >> 12 units TID --ACHS and SSI --titrate regimen   #Acute Metabolic Encephalopathy  --mental back to baseline  Acute infarct Recent CVA --MRI brain showed Punctate acute infarct in the left occipital cortex since MRI 2 weeks prior. --cont ASA, plavix  and statin   DVT prophylaxis: Lovenox  SQ Code Status: Full code   Family Communication: cousin at bedside on rounds 2/5. Pt is capable to update family.     Dispo:   The patient is from: home Anticipated d/c is to: SNF rehab.  Medically sable for d/c.  Anticipated d/c date is: PER TOC - stable for d/c when SNF bed ready   Medically stable   Subjective and Interval History:  Pt sleeping when seen this AM. Briefly wakes.  No acute complaints or events reported.   Objective: Vitals:   03/11/23 0309 03/11/23 0640 03/11/23 0737 03/11/23 1220  BP: (!) 141/60  (!) 152/71 (!) 142/57  Pulse: 60  63 69  Resp: 20     Temp: 98.5 F (36.9 C)  98.9 F (37.2 C) 98.7 F (37.1 C)  TempSrc: Oral  Axillary Axillary  SpO2: 92%  98% 97%  Weight:  (!) 190.9 kg    Height:        Intake/Output Summary (Last 24 hours) at  03/11/2023 1532 Last data filed at 03/11/2023 1214 Gross per 24 hour  Intake --  Output 3100 ml  Net -3100 ml   Filed Weights   03/09/23 0344 03/10/23 0530 03/11/23 0640  Weight: (!) 198.2 kg (!) 193.3 kg (!) 190.9 kg    Examination:   General exam: sleeping, wakes briefly, no acute distress, obese HEENT: moist mucus membranes, hearing grossly normal  Respiratory system: CTAB diminished due to body habitus, no wheezes or rhonchi, normal respiratory effort. On 4 L/min HF [place] O2 Cardiovascular system: normal S1/S2, RRR, no pedal edema.   Gastrointestinal system: soft, NT, ND. Central nervous  system: A&O x3. no gross focal neurologic deficits Psychiatry: normal mood, congruent affect, judgement and insight appear normal    Data Reviewed:   CBG's intermittently in 200's  Notable labs from 2/6 -- glucose 169, Ca 8.4, BUN 33 Hbg 8.9 >> 9.1    Time spent: 35 minutes    Burnard DELENA Cunning, DO [facility] If 7PM-7AM, please contact night-coverage 03/11/2023, 3:32 PM

## 2023-03-11 NOTE — Progress Notes (Signed)
 Mobility Specialist - Progress Note   03/11/23 1506  Mobility  Activity Stood at bedside;Dangled on edge of bed  Level of Assistance Minimal assist, patient does 75% or more  Assistive Device Front wheel walker  Distance Ambulated (ft) 0 ft  Activity Response Tolerated well  Mobility Referral Yes  Mobility visit 1 Mobility  Mobility Specialist Start Time (ACUTE ONLY) 1444  Mobility Specialist Stop Time (ACUTE ONLY) 1503  Mobility Specialist Time Calculation (min) (ACUTE ONLY) 19 min   Pt semi supine in bed on 4L upon arrival. Pt completes bed mobility MinA and able to maintain balance at EOB indep. Pt STS X2 for 1 minute each CGA with no LOB noted. (Patient able to march L+R  the second stand). Pt returns to bed with needs in reach.   Otho Louder  Mobility Specialist  03/11/23 3:08 PM

## 2023-03-11 NOTE — Plan of Care (Signed)
  Problem: Education: Goal: Knowledge of General Education information will improve Description: Including pain rating scale, medication(s)/side effects and non-pharmacologic comfort measures Outcome: Progressing   Problem: Clinical Measurements: Goal: Will remain free from infection Outcome: Progressing Goal: Cardiovascular complication will be avoided Outcome: Progressing   Problem: Elimination: Goal: Will not experience complications related to urinary retention Outcome: Progressing   Problem: Pain Managment: Goal: General experience of comfort will improve and/or be controlled Outcome: Progressing   Problem: Clinical Measurements: Goal: Respiratory complications will improve Outcome: Not Progressing   Problem: Activity: Goal: Risk for activity intolerance will decrease Outcome: Not Progressing

## 2023-03-12 DIAGNOSIS — I10 Essential (primary) hypertension: Secondary | ICD-10-CM | POA: Diagnosis not present

## 2023-03-12 DIAGNOSIS — D509 Iron deficiency anemia, unspecified: Secondary | ICD-10-CM | POA: Diagnosis not present

## 2023-03-12 DIAGNOSIS — I5032 Chronic diastolic (congestive) heart failure: Secondary | ICD-10-CM | POA: Diagnosis not present

## 2023-03-12 DIAGNOSIS — E119 Type 2 diabetes mellitus without complications: Secondary | ICD-10-CM | POA: Diagnosis not present

## 2023-03-12 LAB — BASIC METABOLIC PANEL
Anion gap: 9 (ref 5–15)
BUN: 26 mg/dL — ABNORMAL HIGH (ref 6–20)
CO2: 33 mmol/L — ABNORMAL HIGH (ref 22–32)
Calcium: 8.8 mg/dL — ABNORMAL LOW (ref 8.9–10.3)
Chloride: 99 mmol/L (ref 98–111)
Creatinine, Ser: 1.13 mg/dL (ref 0.61–1.24)
GFR, Estimated: >60 mL/min (ref >60)
Glucose, Bld: 210 mg/dL — ABNORMAL HIGH (ref 70–99)
Potassium: 4.3 mmol/L (ref 3.5–5.1)
Sodium: 141 mmol/L (ref 135–145)

## 2023-03-12 LAB — GLUCOSE, CAPILLARY
Glucose-Capillary: 205 mg/dL — ABNORMAL HIGH (ref 70–99)
Glucose-Capillary: 204 mg/dL — ABNORMAL HIGH (ref 70–99)
Glucose-Capillary: 201 mg/dL — ABNORMAL HIGH (ref 70–99)
Glucose-Capillary: 224 mg/dL — ABNORMAL HIGH (ref 70–99)

## 2023-03-12 MED ORDER — INSULIN GLARGINE-YFGN 100 UNIT/ML ~~LOC~~ SOLN
22.0000 [IU] | Freq: Two times a day (BID) | SUBCUTANEOUS | Status: DC
  Administered 2023-03-12 – 2023-03-13 (×3): 22 [IU] via SUBCUTANEOUS
  Filled 2023-03-12 (×4): qty 0.22

## 2023-03-12 NOTE — Plan of Care (Signed)
  Problem: Education: Goal: Knowledge of General Education information will improve Description: Including pain rating scale, medication(s)/side effects and non-pharmacologic comfort measures Outcome: Progressing   Problem: Health Behavior/Discharge Planning: Goal: Ability to manage health-related needs will improve Outcome: Progressing   Problem: Clinical Measurements: Goal: Ability to maintain clinical measurements within normal limits will improve Outcome: Progressing Goal: Will remain free from infection Outcome: Progressing Goal: Diagnostic test results will improve Outcome: Progressing Goal: Respiratory complications will improve Outcome: Progressing Goal: Cardiovascular complication will be avoided Outcome: Progressing   Problem: Activity: Goal: Risk for activity intolerance will decrease Outcome: Progressing   Problem: Nutrition: Goal: Adequate nutrition will be maintained Outcome: Progressing   Problem: Coping: Goal: Level of anxiety will decrease Outcome: Progressing   Problem: Elimination: Goal: Will not experience complications related to bowel motility Outcome: Progressing Goal: Will not experience complications related to urinary retention Outcome: Progressing   Problem: Pain Managment: Goal: General experience of comfort will improve and/or be controlled Outcome: Progressing   Problem: Safety: Goal: Ability to remain free from injury will improve Outcome: Progressing   Problem: Skin Integrity: Goal: Risk for impaired skin integrity will decrease Outcome: Progressing   Problem: Education: Goal: Ability to describe self-care measures that may prevent or decrease complications (Diabetes Survival Skills Education) will improve Outcome: Progressing Goal: Individualized Educational Video(s) Outcome: Progressing   Problem: Coping: Goal: Ability to adjust to condition or change in health will improve Outcome: Progressing   Problem: Fluid  Volume: Goal: Ability to maintain a balanced intake and output will improve Outcome: Progressing   Problem: Health Behavior/Discharge Planning: Goal: Ability to identify and utilize available resources and services will improve Outcome: Progressing Goal: Ability to manage health-related needs will improve Outcome: Progressing   Problem: Metabolic: Goal: Ability to maintain appropriate glucose levels will improve Outcome: Progressing   Problem: Nutritional: Goal: Maintenance of adequate nutrition will improve Outcome: Progressing Goal: Progress toward achieving an optimal weight will improve Outcome: Progressing   Problem: Skin Integrity: Goal: Risk for impaired skin integrity will decrease Outcome: Progressing   Problem: Tissue Perfusion: Goal: Adequacy of tissue perfusion will improve Outcome: Progressing   Problem: Education: Goal: Understanding of CV disease, CV risk reduction, and recovery process will improve Outcome: Progressing Goal: Individualized Educational Video(s) Outcome: Progressing   Problem: Activity: Goal: Ability to return to baseline activity level will improve Outcome: Progressing   Problem: Cardiovascular: Goal: Ability to achieve and maintain adequate cardiovascular perfusion will improve Outcome: Progressing Goal: Vascular access site(s) Level 0-1 will be maintained Outcome: Progressing   Problem: Health Behavior/Discharge Planning: Goal: Ability to safely manage health-related needs after discharge will improve Outcome: Progressing   Problem: Clinical Measurements: Goal: Ability to avoid or minimize complications of infection will improve Outcome: Progressing   Problem: Skin Integrity: Goal: Skin integrity will improve Outcome: Progressing

## 2023-03-12 NOTE — Progress Notes (Signed)
 Mobility Specialist - Progress Note   03/12/23 1333  Mobility  Activity Ambulated with assistance in room;Transferred from chair to bed  Level of Assistance Contact guard assist, steadying assist  Assistive Device Front wheel walker  Distance Ambulated (ft) 2 ft  Activity Response Tolerated well  Mobility Referral Yes  Mobility visit 1 Mobility  Mobility Specialist Start Time (ACUTE ONLY) 1311  Mobility Specialist Stop Time (ACUTE ONLY) 1329  Mobility Specialist Time Calculation (min) (ACUTE ONLY) 18 min   Pt sitting in recliner on 4L upon arrival. Pt STS and ambulates from chair to bed CGA-SBA +2 for safety. Pt left in bed with needs in reach and family present.   Otho Louder  Mobility Specialist  03/12/23 1:37 PM

## 2023-03-12 NOTE — Progress Notes (Addendum)
 PROGRESS NOTE    Raymond [name]  FMW:[id number] DOB: September 24, 1965 DOA: 03/03/2023 PCP: Jacques Garre, NP  [id number]  LOS: 9 days   Brief hospital course: 58 y.o African American Male with significant PMH of morbid obesity, chronic  HFpEF, HTN, OSA on CPAP, CAD, T2DM, CKD stage II, and recent CVA on 02/14/2023 who presented to the ED with chief complaints of possible infectious process.   On review of chart, patient was recently hospitalized on 02/14/2023 with CVA deemed not a candidate for thrombolytics due to being outside of the window period. He was discharged on 1/27.    He was sent to the ED by family who noted he had not been eating, drinking, or urinating well. He reported that he ran out of his home oxygen .  He was also noted with slurred speech and hypotension.  On EMS arrival patient was hypotensive so was given 500 cc bolus.  Initial EKG was concerning for potential STEMI however patient had no chest pain or shortness of breath.  patient taken to Cath Lab for emergent cath which which did not reveal obstructive coronary artery disease.     Assessment & Plan:  #Acute on Chronic Hypoxic Hypercapnic Respiratory Failure secondary to  #Acute on chronic systolic heart failure #OSA on CPAP -on BiPAP on presentation, now weaned to 4L  (recent home requirement)  -Hold diuretic in the setting of AKI and hypotension   #Hypotension --unclear etiology.  Weaned off levophed  on 03/05/23.  Now hypertensive.  #HTN --cont coreg  --resume home Imdur  --resume ARB and torsemide  - held for [facility] initially  #Inferior STEMI, ruled out #Cardiogenic shock, ruled out --heart cath which which did not reveal obstructive coronary artery disease.    Trop elevation 2/2 demand ischemia  #HFpEF prior EF 50-55% with new reduced systolic function with LVEF 450% on 03/03/23 --cont coreg   #Leukocytosis  --empiric abx d/c'ed after no infection found   #AKI - resolved #CKD stage II -  --Cr  peaked at 3.44 >>> 0.84 normal --Resume ARB and torsemide  that were held   #T2DM  Diabetic Neuropathy  Recent A1c 6.7.  2/8 - intermittent CBG's above 180 into the 200's --increase glargine 20 >> 22 units BID --mealtime 12 units TID --ACHS and SSI --titrate regimen   #Acute Metabolic Encephalopathy  --mental back to baseline  Acute infarct Recent CVA --MRI brain showed Punctate acute infarct in the left occipital cortex since MRI 2 weeks prior. --cont ASA, plavix  and statin   DVT prophylaxis: Lovenox  SQ Code Status: Full code   Family Communication: cousin at bedside on rounds 2/5. Pt is capable to update family.     Dispo:   The patient is from: home Anticipated d/c is to: SNF rehab.  Medically sable for d/c.  Anticipated d/c date is: PER TOC - stable for d/c when SNF bed ready   Medically stable   Subjective and Interval History:  Pt sleeping, on bipap this AM.  No complaints or acute events reported.   Objective: Vitals:   03/12/23 0423 03/12/23 0517 03/12/23 0818 03/12/23 1201  BP: (!) 144/71  138/66 (!) 146/68  Pulse: 70  (!) 58 74  Resp: 20     Temp: 99.2 F (37.3 C)  98.2 F (36.8 C) 98.1 F (36.7 C)  TempSrc:   Axillary Oral  SpO2: 94%  100% 97%  Weight:  (!) 188.1 kg    Height:        Intake/Output Summary (Last 24 hours) at 03/12/2023 1225 Last  data filed at 03/12/2023 1201 Gross per 24 hour  Intake 240 ml  Output 1900 ml  Net -1660 ml   Filed Weights   03/10/23 0530 03/11/23 0640 03/12/23 0517  Weight: (!) 193.3 kg (!) 190.9 kg (!) 188.1 kg    Examination:   General exam: sleeping, wakes briefly, no acute distress, obese HEENT: bipap mask, moist mucus membranes, hearing grossly normal  Respiratory system: CTAB diminished due to body habitus, no wheezes or rhonchi, normal respiratory effort. On bipap Cardiovascular system: normal S1/S2, RRR, no pedal edema.   Gastrointestinal system: soft, NT, ND. Central nervous system: no gross  focal neurologic deficits, pt somnolent    Data Reviewed:   CBG's in 200's  Notable labs Bicarb 33 Glucose 210 BUN 26 Ca 8.8     Time spent: 25 minutes    Burnard DELENA Cunning, DO [facility] If 7PM-7AM, please contact night-coverage 03/12/2023, 12:25 PM

## 2023-03-12 NOTE — Progress Notes (Signed)
 Physical Therapy Treatment Patient Details Name: Raymond [name] MRN: [id number] DOB: [birth year] Today's Date: 03/12/2023   History of Present Illness Patient is a 58 year old male with STEMI s/p LHC, acute on chronic hypoxic respiratory failure. History of morbid obesity, chronic  HFpEF, HTN, OSA on CPAP, CAD, T2DM, and recent CVA    PT Comments  Pt in bed, ready for session.  Is able to get to EOB with rails but no physical assist.  Steady in sitting.  +2 available as pt stating he wants to try walking.  Pt with one hand on bed and one on walker.  Attempted to steady walker for him as it is tilting and pt yelled not to touch walker.  He is able to stand and obtain balance.  Pt is able to progress gait 110' today with RW and supervision and +2 for O2 tank.  Pt very irritated at attempts to provide hands on guarding yelling at [employer].  He does demonstrate some balance and general weakness but needs no assist and does self initiate sitting when tired.  Does not feel he can do more at this time and is returned to room where he stays in recliner with needs met.        If plan is discharge home, recommend the following: A little help with walking and/or transfers;A little help with bathing/dressing/bathroom;Assist for transportation;Help with stairs or ramp for entrance;Assistance with cooking/housework   Can travel by [employer] walker (2 wheels);BSC/3in1 (bariatric sizes)    Recommendations for Other Services       Precautions / Restrictions Precautions Precautions: Fall Restrictions Weight Bearing Restrictions Per Provider Order: No     Mobility  Bed Mobility Overal bed mobility: Needs Assistance Bed Mobility: Supine to Sit     Supine to sit: Supervision, Used rails, HOB elevated          Transfers Overall transfer level: Needs assistance Equipment used: Rolling walker (2 wheels) Transfers: Sit to/from Stand Sit to  Stand: Supervision                Ambulation/Gait Ambulation/Gait assistance: Supervision, +2 safety/equipment Gait Distance (Feet): 110 Feet Assistive device: Rolling walker (2 wheels) Gait Pattern/deviations: Step-through pattern, Decreased step length - right, Decreased step length - left, Wide base of support Gait velocity: dec     General Gait Details: was able to significantly improve gait today   Stairs             Wheelchair Mobility     Tilt Bed    Modified Rankin (Stroke Patients Only)       Balance Overall balance assessment: Needs assistance Sitting-balance support: No upper extremity supported, Feet supported Sitting balance-Leahy Scale: Good     Standing balance support: Bilateral upper extremity supported, Reliant on assistive device for balance Standing balance-Leahy Scale: Fair Standing balance comment: RW use for balance in standing                            Cognition Arousal: Alert Behavior During Therapy: Agitated, WFL for tasks assessed/performed Overall Cognitive Status: Within Functional Limits for tasks assessed                                 General Comments: gernerally irriated today with attempts to assist. did not want any hands  on guarding or conversation        Exercises      General Comments        Pertinent Vitals/Pain Pain Assessment Pain Assessment: No/denies pain Pain Intervention(s): Monitored during session, Repositioned    Home Living                          Prior Function            PT Goals (current goals can now be found in the care plan section) Progress towards PT goals: Progressing toward goals    Frequency    Min 1X/week      PT Plan      Co-evaluation              AM-PAC PT 6 Clicks Mobility   Outcome Measure  Help needed turning from your back to your side while in a flat bed without using bedrails?: A Little Help needed moving  from lying on your back to sitting on the side of a flat bed without using bedrails?: A Little Help needed moving to and from a bed to a chair (including a wheelchair)?: A Little Help needed standing up from a chair using your arms (e.g., wheelchair or bedside chair)?: None Help needed to walk in hospital room?: None Help needed climbing 3-5 steps with a railing? : A Little 6 Click Score: 20    End of Session Equipment Utilized During Treatment: Oxygen  Activity Tolerance: Patient tolerated treatment well Patient left: in chair;with call bell/phone within reach;with chair alarm set Nurse Communication: Mobility status PT Visit Diagnosis: Unsteadiness on feet (R26.81);Muscle weakness (generalized) (M62.81) Hemiplegia - Right/Left: Left Hemiplegia - dominant/non-dominant: Dominant Hemiplegia - caused by: Cerebral infarction     Time: 8945-8886 PT Time Calculation (min) (ACUTE ONLY): 19 min  Charges:    $Gait Training: 8-22 mins PT General Charges $$ ACUTE PT VISIT: 1 Visit                   Lauraine Gills, PTA 03/12/23, 12:42 PM

## 2023-03-13 DIAGNOSIS — Z794 Long term (current) use of insulin: Secondary | ICD-10-CM | POA: Diagnosis not present

## 2023-03-13 DIAGNOSIS — I5032 Chronic diastolic (congestive) heart failure: Secondary | ICD-10-CM | POA: Diagnosis not present

## 2023-03-13 DIAGNOSIS — E119 Type 2 diabetes mellitus without complications: Secondary | ICD-10-CM | POA: Diagnosis not present

## 2023-03-13 DIAGNOSIS — I10 Essential (primary) hypertension: Secondary | ICD-10-CM | POA: Diagnosis not present

## 2023-03-13 LAB — GLUCOSE, CAPILLARY
Glucose-Capillary: 182 mg/dL — ABNORMAL HIGH (ref 70–99)
Glucose-Capillary: 174 mg/dL — ABNORMAL HIGH (ref 70–99)
Glucose-Capillary: 205 mg/dL — ABNORMAL HIGH (ref 70–99)

## 2023-03-13 MED ORDER — CARVEDILOL 12.5 MG PO TABS: 12.5000 mg | ORAL_TABLET | Freq: Two times a day (BID) | ORAL | 2 refills | Status: DC

## 2023-03-13 MED ORDER — LANTUS 100 UNIT/ML ~~LOC~~ SOLN: 60.0000 [IU] | Freq: Two times a day (BID) | SUBCUTANEOUS | 11 refills | Status: DC

## 2023-03-13 NOTE — Plan of Care (Signed)
  Problem: Education: Goal: Knowledge of General Education information will improve Description: Including pain rating scale, medication(s)/side effects and non-pharmacologic comfort measures Outcome: Progressing   Problem: Health Behavior/Discharge Planning: Goal: Ability to manage health-related needs will improve Outcome: Progressing   Problem: Clinical Measurements: Goal: Ability to maintain clinical measurements within normal limits will improve Outcome: Progressing Goal: Will remain free from infection Outcome: Progressing Goal: Diagnostic test results will improve Outcome: Progressing Goal: Respiratory complications will improve Outcome: Progressing Goal: Cardiovascular complication will be avoided Outcome: Progressing   Problem: Clinical Measurements: Goal: Will remain free from infection Outcome: Progressing   Problem: Clinical Measurements: Goal: Diagnostic test results will improve Outcome: Progressing

## 2023-03-13 NOTE — Progress Notes (Signed)
 Discharge instructions and teaching provided. Pt verbalized and demonstrated understanding of provided instructions. All outstanding questions resolved. R arm PIV removed. Cannula intact. Pt tolerated well. All belongings packed and in tow.

## 2023-03-13 NOTE — TOC Transition Note (Signed)
 Transition of Care [facility]) - Discharge Note   Patient Details  Name: Raymond [name] MRN: [id number] Date of Birth: 1965-11-29  Transition of Care [facility]) CM/SW Contact:  Zamire Whitehurst C Damien Cisar, RN Phone Number: 03/13/2023, 11:37 AM   Clinical Narrative:    Spoke with patient to advise he does not  have any bed offers. He is agreeable to [facility]. He stated he was previously with an agency but could not recall the name. He would like to have that agency see him at discharge. Patient has home oxygen  via [employer]. His girlfriend will come to  transport him home. He has been advised to have his girlfriend bring his oxygen  from home for transport.   RNCM spoke with Bartholomew Light from [place] to advise patient is discharging home today.   TOC signing off.           Patient Goals and CMS Choice            Discharge Placement                       Discharge Plan and Services Additional resources added to the After Visit Summary for                                       Social Drivers of Health (SDOH) Interventions SDOH Screenings   Food Insecurity: No Food Insecurity (03/04/2023)  Housing: Low Risk  (03/04/2023)  Transportation Needs: No Transportation Needs (03/04/2023)  Utilities: Not At Risk (03/04/2023)  Tobacco Use: Low Risk  (02/14/2023)     Readmission Risk Interventions     No data to display

## 2023-03-13 NOTE — Discharge Summary (Addendum)
 Physician Discharge Summary   Patient: Raymond [name] MRN: [id number] DOB: 10-Nov-1965  Admit date:     03/03/2023  Discharge date: 03/13/2023  Discharge Physician: Pennie Banter   PCP: Martie Round, NP   Recommendations at discharge:    Follow up with Primary Care in 1-2 weeks Follow up as scheduled with Cardiology Repeat CBC, BMP, Mg at follow up Follow up on glycemic control and continue to optimize regimen  Discharge Diagnoses: Active Problems:   Stroke (HCC)   Chronic diastolic heart failure (HCC)   Essential hypertension   Type 2 diabetes mellitus without complications (HCC)   HTN (hypertension)   Iron deficiency anemia   COPD (chronic obstructive pulmonary disease) (HCC)   Morbid obesity with BMI of 50.0-59.9, adult (HCC)  Resolved Problems:   Slurred speech   AKI (acute kidney injury) [facility])  Hospital Course:  HPI on admission: "58 y.o with significant PMH of morbid obesity, chronic  HFpEF, HTN, OSA on CPAP, CAD, T2DM, CKD stage II, and recent CVA on 02/14/2023 who presented to the ED with chief complaints of possible infectious process.   On review of chart, patient was recently hospitalized on 02/14/2023 with CVA deemed not a candidate for thrombolytics due to being outside of the window period. He was discharged on 1/27.     He was sent to the ED by family who noted he had not been eating, drinking, or urinating well. He reported that he ran out of his home oxygen.  He was also noted with slurred speech and hypotension.  On EMS arrival patient was hypotensive so was given 500 cc bolus.  Initial EKG was concerning for potential STEMI however patient had no chest pain or shortness of breath.  patient taken to Cath Lab for emergent cath which which did not reveal obstructive coronary artery disease.  "  Further hospital course and management as outlined below.  2/10 -- pt doing well this AM, denies acute complaints. Pt is clinically improved and  medically stable for discharge home today with home health.   Assessment and Plan:  #Acute on Chronic Hypoxic Hypercapnic Respiratory Failure secondary to  #Acute on chronic systolic heart failure #History of HFpEF (prior EF 50-55%) with new reduced systolic function with LVEF 45% on 03/03/23 #OSA on CPAP -on BiPAP on presentation, now weaned to 4L [place] (recent home requirement)  -Coreg, ARB -Torsemide resumed -Daily weights -follow up with cardiology - further GDMT in follow up   #Hypotension - resolved --unclear etiology.  Weaned off levophed on 03/05/23.     #HTN --cont coreg --resume home Imdur --resume ARB and torsemide   #Inferior STEMI, ruled out #Cardiogenic shock, ruled out #Trop elevation 2/2 demand ischemia == TYPE 2 NSTEMI --heart cath which which did not reveal obstructive coronary artery disease.      #Leukocytosis  --empiric abx d/c'ed after no infection found   #AKI - resolved #CKD stage II -  --Cr peaked at 3.44 >>> 0.84 normal --Resume ARB and torsemide that were held   #T2DM  Diabetic Neuropathy  Recent A1c 6.7.  2/8 - intermittent CBG's above 180 into the 200's Insulin regimen was titrated up for hyperglycemia. --Discharge regimen as below -- metformin, Jardian and Lantus basal insulin --Close PCP follow recommended   #Acute Metabolic Encephalopathy  --mental back to baseline   Acute infarct Recent CVA --MRI brain showed Punctate acute infarct in the left occipital cortex since MRI 2 weeks prior. --cont ASA, plavix and statin  Consultants: Cardiology Procedures performed: as above  Disposition: Home health Diet recommendation:  Cardiac and Carb modified diet DISCHARGE MEDICATION: Allergies as of 03/13/2023   No Known Allergies      Medication List     TAKE these medications    aspirin EC 81 MG tablet Take 81 mg by mouth daily.   atorvastatin 40 MG tablet Commonly known as: LIPITOR atorvastatin 40 mg tablet   Byetta 10  MCG Pen 10 MCG/0.04ML Sopn injection Generic drug: exenatide Inject 10 mcg into the skin 2 (two) times daily with a meal.   carvedilol 12.5 MG tablet Commonly known as: COREG Take 1 tablet (12.5 mg total) by mouth 2 (two) times daily with a meal. What changed:  medication strength how much to take   Centrum Silver 50+Men Tabs Take 1 tablet by mouth daily.   empagliflozin 25 MG Tabs tablet Commonly known as: JARDIANCE Take by mouth daily. Once daily in the am   Fe Fum-Vit C-Vit B12-FA Caps capsule Commonly known as: TRIGELS-F FORTE Take 1 capsule by mouth 2 (two) times daily.   gabapentin 300 MG capsule Commonly known as: NEURONTIN Take 300 mg by mouth 3 (three) times daily.   insulin aspart 100 UNIT/ML injection Commonly known as: novoLOG Inject into the skin 3 (three) times daily before meals.   isosorbide mononitrate 60 MG 24 hr tablet Commonly known as: IMDUR Take 1 tablet (60 mg total) by mouth at bedtime.   Lantus 100 UNIT/ML injection Generic drug: insulin glargine Inject 0.6 mLs (60 Units total) into the skin 2 (two) times daily. What changed: how much to take   metFORMIN 1000 MG tablet Commonly known as: GLUCOPHAGE Take 1,000 mg by mouth 2 (two) times daily with a meal.   OXYGEN Inhale 2 L into the lungs daily.   pantoprazole 40 MG tablet Commonly known as: PROTONIX Take 40 mg by mouth daily.   polyethylene glycol 17 g packet Commonly known as: MIRALAX / GLYCOLAX Take 17 g by mouth daily as needed for mild constipation.   tamsulosin 0.4 MG Caps capsule Commonly known as: FLOMAX Take 1 capsule (0.4 mg total) by mouth daily after supper.   torsemide 20 MG tablet Commonly known as: DEMADEX Take 2 tablets (40 mg total) by mouth daily.   TRUEplus Insulin Syringe 29G X 1/2" 1 ML Misc Generic drug: INSULIN SYRINGE 1CC/29G   TRUEplus Pen Needles 31G X 8 MM Misc Generic drug: Insulin Pen Needle   valsartan 160 MG tablet Commonly known as:  DIOVAN Take 160 mg by mouth daily.       ASK your doctor about these medications    clopidogrel 75 MG tablet Commonly known as: PLAVIX Take 1 tablet (75 mg total) by mouth daily for 21 days. Ask about: Should I take this medication?        Follow-up Information     Gerlene Fee, PA-C. Go in 1 week(s).   Specialty: Cardiology Why: Phone number is incorrect Contact information: [address] [place] [address] [phone]                Discharge Exam: [name] Weights   03/11/23 0640 03/12/23 0517 03/13/23 0500  Weight: (!) 190.9 kg (!) 188.1 kg (!) 182 kg   General exam: awake, alert, no acute distress, obese HEENT: atraumatic, clear conjunctiva, anicteric sclera, moist mucus membranes, hearing grossly normal  Respiratory system: CTAB diminished due to body habitus, no wheezes, rales or rhonchi, normal respiratory effort. Cardiovascular system:  normal S1/S2,  RRR, no JVD, murmurs, rubs, gallops,  no pedal edema.   Gastrointestinal system: soft, NT, ND, no HSM felt, +bowel sounds. Central nervous system: A&O x 3. no gross focal neurologic deficits, normal speech Extremities: moves all , no edema, normal tone Skin: dry, intact, normal temperature Psychiatry: normal mood, congruent affect, judgement and insight appear normal   Condition at discharge: stable  The results of significant diagnostics from this hospitalization (including imaging, microbiology, ancillary and laboratory) are listed below for reference.   Imaging Studies: ECHOCARDIOGRAM LIMITED Result Date: 03/05/2023    ECHOCARDIOGRAM LIMITED REPORT   Patient Name:   Raymond [name] [facility] Date of Exam: 03/05/2023 Medical Rec #:  [id number]                   Height:       74.0 in Accession #:    [id number]                  Weight:       412.3 lb Date of Birth:  Jul 01, 1965                   BSA:          2.956 m Patient Age:    57 years                    BP:           119/55 mmHg Patient  Gender: M                           HR:           60 bpm. Exam Location:  [facility] Procedure: Limited Echo and Intracardiac Opacification Agent Indications:     Acute myocardial infarction I21.9  History:         Patient has prior history of Echocardiogram examinations, most                  recent 02/15/2023.  Sonographer:     Overton Mam RDCS, FASE Referring Phys:  [id number] Lyn Hollingshead PARASCHOS Diagnosing Phys: Marcina Millard MD  Sonographer Comments: Technically challenging study due to limited acoustic windows, suboptimal apical window, no subcostal window and patient is obese. Image acquisition challenging due to patient body habitus. A Limited Echo was requested by Cardiology  to re-evaluate left ventricular function. IMPRESSIONS  1. Left ventricular ejection fraction, by estimation, is 55 to 60%. The left ventricle has normal function. The left ventricle has no regional wall motion abnormalities. FINDINGS  Left Ventricle: Left ventricular ejection fraction, by estimation, is 55 to 60%. The left ventricle has normal function. The left ventricle has no regional wall motion abnormalities. Definity contrast agent was given IV to delineate the left ventricular  endocardial borders. LEFT VENTRICLE PLAX 2D LVIDd:         5.60 cm LVIDs:         3.80 cm LV PW:         1.30 cm LV IVS:        1.30 cm  LV Volumes (MOD) LV vol d, MOD A4C: 130.0 ml LV vol s, MOD A4C: 46.6 ml LV SV MOD A4C:     130.0 ml LEFT ATRIUM         Index LA diam:    4.40 cm 1.49 cm/m Marcina Millard MD Electronically signed by Marcina Millard MD Signature Date/Time: 03/05/2023/10:35:12 AM  Final    MR BRAIN WO CONTRAST Result Date: 03/04/2023 CLINICAL DATA:  Follow-up stroke EXAM: MRI HEAD WITHOUT CONTRAST TECHNIQUE: Multiplanar, multiecho pulse sequences of the brain and surrounding structures were obtained without intravenous contrast. COMPARISON:  Head CT from yesterday.  Brain MRI 02/14/2023 FINDINGS: Brain: Small acute infarct in  the left occipital cortex since prior. Bleeding restricted diffusion in the right frontal cortex at site of subacute infarction. Some cortical laminar necrosis in this region by T1 weighted imaging. Pre-existing infarcts in the right cerebellum and left parietooccipital cortex. Diffusion hyperintensity in the midline related to dural ossification with artifact. No acute hemorrhage, hydrocephalus, or collection. Vascular: Major flow voids are preserved. Skull and upper cervical spine: Normal marrow signal. Sinuses/Orbits: Negative. IMPRESSION: *Punctate acute infarct in the left occipital cortex since MRI 2 weeks prior. *Expected evolution of subacute right frontal infarct. 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 Electronically Signed   By: Tiburcio Pea M.D.   On: 03/04/2023 07:12   CARDIAC CATHETERIZATION Result Date: 03/03/2023   There is mild left ventricular systolic dysfunction.   LV end diastolic pressure is mildly elevated.   The left ventricular ejection fraction is 45-50% by visual estimate. 1.  Normal coronary anatomy 2.  Mildly reduced left ventricular function with estimated LV ejection fraction 45% with apical hypokinesis 3.  Acute renal failure 4.  Recent CVA Recommendations 1.  Medical therapy 2.  Continue aspirin and clopidogrel, following recent CVA 3.  Repeat 2D echocardiogram 4.  Nephrology consult   CT Renal Stone Study Result Date: 03/03/2023 CLINICAL DATA:  Abdominal/flank pain, stone suspected EXAM: CT ABDOMEN AND PELVIS WITHOUT CONTRAST TECHNIQUE: Multidetector CT imaging of the abdomen and pelvis was performed following the standard protocol without IV contrast. RADIATION DOSE REDUCTION: This exam was performed according to the departmental dose-optimization program which includes automated exposure control, adjustment of the mA and/or kV according to patient size and/or use of iterative reconstruction technique. COMPARISON:  08/06/2007 FINDINGS: Lower  chest: No acute abnormality. Hepatobiliary: No focal liver abnormality is seen. Status post cholecystectomy. No biliary dilatation. Pancreas: Unremarkable Spleen: Unremarkable Adrenals/Urinary Tract: Adrenal glands are unremarkable. Kidneys are normal, without renal calculi, focal lesion, or hydronephrosis. Bladder is unremarkable. Stomach/Bowel: Stomach is within normal limits. Appendix appears normal. No evidence of bowel wall thickening, distention, or inflammatory changes. Vascular/Lymphatic: Mild visceral arteriosclerosis. The abdominopelvic vasculature is otherwise unremarkable in this noncontrast examination. No pathologic adenopathy within the abdomen and pelvis. Reproductive: Prostate is unremarkable. Other: There is asymmetric subcutaneous edema and dermal thickening involving the pannus which may relate to passive a though a superficial inflammatory process such as cellulitis, could appear similarly. Small fat containing umbilical hernia. Musculoskeletal: The osseous structures are age-appropriate. No acute bone abnormality. No lytic or blastic bone lesions are identified. Bilateral hip effusions are suspected, well assessed on this examination. IMPRESSION: 1. No acute intra-abdominal pathology identified. No definite radiographic explanation for the patient's reported symptoms. 2. Asymmetric subcutaneous edema and dermal thickening involving the pannus which may relate to passive a though a superficial inflammatory process such as cellulitis, could appear similarly. 3. Bilateral hip effusions are suspected, well assessed on this examination. 4. Mild visceral arteriosclerosis. Electronically Signed   By: Helyn Numbers M.D.   On: 03/03/2023 20:37   CT HEAD WO CONTRAST ( ) Result Date: 03/03/2023 CLINICAL DATA:  Headache, new onset (Age >= 51y) EXAM: CT HEAD WITHOUT CONTRAST TECHNIQUE: Contiguous axial images were obtained from the base of the skull through the vertex without  intravenous contrast.  RADIATION DOSE REDUCTION: This exam was performed according to the departmental dose-optimization program which includes automated exposure control, adjustment of the mA and/or kV according to patient size and/or use of iterative reconstruction technique. COMPARISON:  MRI 02/14/2023. FINDINGS: Brain: Evolving right frontal infarct. No definite evidence of acute/interval large vascular territory infarct. Remote bilateral cerebellar infarcts and remote left parietal infarct. Vascular: No hyperdense vessel identified. Skull: No acute fracture. Sinuses/Orbits: Mild paranasal sinus mucosal thickening. IMPRESSION: 1. Evolving right frontal infarct. No evidence of new/interval acute abnormality; however, MRI could provide more sensitive evaluation for new peri-infarct ischemia if clinically warranted. 2. Remote cerebellar and left parietal infarcts. Electronically Signed   By: Feliberto Harts M.D.   On: 03/03/2023 20:20   DG Chest Portable 1 View Result Date: 03/03/2023 CLINICAL DATA:  Shortness of breath EXAM: PORTABLE CHEST 1 VIEW COMPARISON:  02/14/2023 FINDINGS: Similar cardiomegaly. Low lung volumes. Pulmonary vascular congestion. Bibasilar atelectasis. Otherwise no focal consolidation. No definite pleural effusion or pneumothorax. No pneumothorax. IMPRESSION: Cardiomegaly with pulmonary vascular congestion. Electronically Signed   By: Minerva Fester M.D.   On: 03/03/2023 20:14    Microbiology: Results for orders placed or performed during the hospital encounter of 03/03/23  Blood culture (routine x 2)     Status: None   Collection Time: 03/03/23  7:15 PM   Specimen: BLOOD RIGHT ARM  Result Value Ref Range Status   Specimen Description BLOOD RIGHT ARM  Final   Special Requests   Final    BOTTLES DRAWN AEROBIC AND ANAEROBIC Blood Culture adequate volume   Culture   Final    NO GROWTH 5 DAYS Performed at [facility], [address], [place], [place] [address]    Report Status 03/08/2023  FINAL  Final  Blood culture (routine x 2)     Status: None   Collection Time: 03/03/23  8:02 PM   Specimen: BLOOD LEFT ARM  Result Value Ref Range Status   Specimen Description BLOOD LEFT ARM  Final   Special Requests   Final    BOTTLES DRAWN AEROBIC AND ANAEROBIC Blood Culture adequate volume   Culture   Final    NO GROWTH 5 DAYS Performed at [facility], [address] [place], [place] [address]    Report Status 03/08/2023 FINAL  Final  Resp panel by RT-PCR (RSV, Flu A&B, Covid) Anterior Nasal Swab     Status: None   Collection Time: 03/03/23  8:02 PM   Specimen: Anterior Nasal Swab  Result Value Ref Range Status   SARS Coronavirus 2 by RT PCR NEGATIVE NEGATIVE Final    Comment: (NOTE) SARS-CoV-2 target nucleic acids are NOT DETECTED.  The SARS-CoV-2 RNA is generally detectable in upper respiratory specimens during the acute phase of infection. The lowest concentration of SARS-CoV-2 viral copies this assay can detect is 138 copies/mL. A negative result does not preclude SARS-Cov-2 infection and should not be used as the sole basis for treatment or other patient management decisions. A negative result may occur with  improper specimen collection/handling, submission of specimen other than nasopharyngeal swab, presence of viral mutation(s) within the areas targeted by this assay, and inadequate number of viral copies(<138 copies/mL). A negative result must be combined with clinical observations, patient history, and epidemiological information. The expected result is Negative.  Fact Sheet for Patients:  BloggerCourse.com  Fact Sheet for Healthcare Providers:  SeriousBroker.it  This test is no t yet approved or cleared by the [place] and  has been authorized for detection and/or diagnosis of SARS-CoV-2 by FDA under an Emergency Use Authorization (EUA). This EUA will remain  in effect (meaning this test  can be used) for the duration of the COVID-19 declaration under Section 564(b)(1) of the Act, 21 U.S.C.section 360bbb-3(b)(1), unless the authorization is terminated  or revoked sooner.       Influenza A by PCR NEGATIVE NEGATIVE Final   Influenza B by PCR NEGATIVE NEGATIVE Final    Comment: (NOTE) The Xpert Xpress SARS-CoV-2/FLU/RSV plus assay is intended as an aid in the diagnosis of influenza from Nasopharyngeal swab specimens and should not be used as a sole basis for treatment. Nasal washings and aspirates are unacceptable for Xpert Xpress SARS-CoV-2/FLU/RSV testing.  Fact Sheet for Patients: BloggerCourse.com  Fact Sheet for Healthcare Providers: SeriousBroker.it  This test is not yet approved or cleared by the [place] FDA and has been authorized for detection and/or diagnosis of SARS-CoV-2 by FDA under an Emergency Use Authorization (EUA). This EUA will remain in effect (meaning this test can be used) for the duration of the COVID-19 declaration under Section 564(b)(1) of the Act, 21 U.S.C. section 360bbb-3(b)(1), unless the authorization is terminated or revoked.     Resp Syncytial Virus by PCR NEGATIVE NEGATIVE Final    Comment: (NOTE) Fact Sheet for Patients: BloggerCourse.com  Fact Sheet for Healthcare Providers: SeriousBroker.it  This test is not yet approved or cleared by the [place] FDA and has been authorized for detection and/or diagnosis of SARS-CoV-2 by FDA under an Emergency Use Authorization (EUA). This EUA will remain in effect (meaning this test can be used) for the duration of the COVID-19 declaration under Section 564(b)(1) of the Act, 21 U.S.C. section 360bbb-3(b)(1), unless the authorization is terminated or revoked.  Performed at [facility], [address] [place], [place] [address]   MRSA Next Gen by PCR, Nasal      Status: None   Collection Time: 03/03/23 10:40 PM   Specimen: Anterior Nasal Swab  Result Value Ref Range Status   MRSA by PCR Next Gen NOT DETECTED NOT DETECTED Final    Comment: (NOTE) The GeneXpert MRSA Assay (FDA approved for NASAL specimens only), is one component of a comprehensive MRSA colonization surveillance program. It is not intended to diagnose MRSA infection nor to guide or monitor treatment for MRSA infections. Test performance is not FDA approved in patients less than 59 years old. Performed at [facility], [address] [place], [place] [address]     Labs: CBC: No results for input(s): "WBC", "NEUTROABS", "HGB", "HCT", "MCV", "PLT" in the last 168 hours.  Basic Metabolic Panel: No results for input(s): "NA", "K", "CL", "CO2", "GLUCOSE", "BUN", "CREATININE", "CALCIUM", "MG", "PHOS" in the last 168 hours.  Liver Function Tests: No results for input(s): "AST", "ALT", "ALKPHOS", "BILITOT", "PROT", "ALBUMIN" in the last 168 hours. CBG: No results for input(s): "GLUCAP" in the last 168 hours.   Discharge time spent: less than 30 minutes.  Signed: Pennie Banter, DO [facility] 03/23/2023

## 2023-03-23 ENCOUNTER — Encounter: Payer: Self-pay | Admitting: Pulmonary Disease

## 2023-04-12 ENCOUNTER — Emergency Department

## 2023-04-12 ENCOUNTER — Emergency Department
Admission: EM | Admit: 2023-04-12 | Discharge: 2023-04-28 | Disposition: A | Discharge status: Home / Self Care | Attending: Emergency Medicine | Admitting: Emergency Medicine

## 2023-04-12 ENCOUNTER — Other Ambulatory Visit: Payer: Self-pay

## 2023-04-12 ENCOUNTER — Encounter: Payer: Self-pay | Admitting: Emergency Medicine

## 2023-04-12 DIAGNOSIS — J449 Chronic obstructive pulmonary disease, unspecified: Secondary | ICD-10-CM | POA: Diagnosis not present

## 2023-04-12 DIAGNOSIS — E119 Type 2 diabetes mellitus without complications: Secondary | ICD-10-CM | POA: Insufficient documentation

## 2023-04-12 DIAGNOSIS — R531 Weakness: Secondary | ICD-10-CM

## 2023-04-12 DIAGNOSIS — I5032 Chronic diastolic (congestive) heart failure: Secondary | ICD-10-CM | POA: Insufficient documentation

## 2023-04-12 DIAGNOSIS — Z6841 Body Mass Index (BMI) 40.0 and over, adult: Secondary | ICD-10-CM | POA: Insufficient documentation

## 2023-04-12 DIAGNOSIS — I110 Hypertensive heart disease with heart failure: Secondary | ICD-10-CM | POA: Diagnosis not present

## 2023-04-12 DIAGNOSIS — R0602 Shortness of breath: Secondary | ICD-10-CM | POA: Diagnosis not present

## 2023-04-12 DIAGNOSIS — I10 Essential (primary) hypertension: Secondary | ICD-10-CM | POA: Insufficient documentation

## 2023-04-12 LAB — BASIC METABOLIC PANEL
Anion gap: 9 (ref 5–15)
BUN: 28 mg/dL — ABNORMAL HIGH (ref 6–20)
CO2: 32 mmol/L (ref 22–32)
Calcium: 8.5 mg/dL — ABNORMAL LOW (ref 8.9–10.3)
Chloride: 102 mmol/L (ref 98–111)
Creatinine, Ser: 1.33 mg/dL — ABNORMAL HIGH (ref 0.61–1.24)
GFR, Estimated: >60 mL/min (ref >60)
Glucose, Bld: 127 mg/dL — ABNORMAL HIGH (ref 70–99)
Potassium: 4.7 mmol/L (ref 3.5–5.1)
Sodium: 143 mmol/L (ref 135–145)

## 2023-04-12 LAB — CBC
HCT: 34.7 % — ABNORMAL LOW (ref 39.0–52.0)
Hemoglobin: 9.5 g/dL — ABNORMAL LOW (ref 13.0–17.0)
MCH: 23.6 pg — ABNORMAL LOW (ref 26.0–34.0)
MCHC: 27.4 g/dL — ABNORMAL LOW (ref 30.0–36.0)
MCV: 86.1 fL (ref 80.0–100.0)
Platelets: 307 10*3/uL (ref 150–400)
RBC: 4.03 MIL/uL — ABNORMAL LOW (ref 4.22–5.81)
RDW: 21.2 % — ABNORMAL HIGH (ref 11.5–15.5)
WBC: 9.5 10*3/uL (ref 4.0–10.5)
nRBC: 0.3 % — ABNORMAL HIGH (ref 0.0–0.2)

## 2023-04-12 LAB — URINALYSIS, ROUTINE W REFLEX MICROSCOPIC
Bacteria, UA: NONE SEEN
Bilirubin Urine: NEGATIVE
Glucose, UA: >=500 mg/dL — AB
Hgb urine dipstick: NEGATIVE
Ketones, ur: NEGATIVE mg/dL
Leukocytes,Ua: NEGATIVE
Nitrite: NEGATIVE
Protein, ur: NEGATIVE mg/dL
Specific Gravity, Urine: 1.015 (ref 1.005–1.030)
pH: 5 (ref 5.0–8.0)

## 2023-04-12 LAB — RESP PANEL BY RT-PCR (RSV, FLU A&B, COVID)  RVPGX2
Influenza A by PCR: NEGATIVE
Influenza B by PCR: NEGATIVE
Resp Syncytial Virus by PCR: NEGATIVE
SARS Coronavirus 2 by RT PCR: NEGATIVE

## 2023-04-12 LAB — CBG MONITORING, ED: Glucose-Capillary: 95 mg/dL (ref 70–99)

## 2023-04-12 NOTE — ED Notes (Signed)
 Pt sat up in bed to eat dinner.

## 2023-04-12 NOTE — ED Notes (Signed)
 This RN and Connye Burkitt, RN attempted to in and out pt for the urine specimen needed and were both unsuccessful.

## 2023-04-12 NOTE — ED Notes (Signed)
 Pt provided with pillow to rest underneath his arm. Pt ABCs intact. RR even and unlabored. Pt in NAD. Bed in lowest locked position. Call bell in reach. Denies needs at this time.

## 2023-04-12 NOTE — ED Provider Notes (Signed)
 [facility] Provider Note    Event Date/Time   First MD Initiated Contact with Patient 04/12/23 1337     (approximate)   History   Chief Complaint: Weakness   HPI  Raymond [name] is a 58 y.o. male with a history of hypertension, diabetes, GERD, super morbid obesity, COPD, prior stroke who comes the ED due to generalized weakness.  Reports that he is not able to get himself out of bed and use the bathroom or feed himself.  Reports that his girlfriend is also not in good health and struggling to care for him.  He reports being admitted to the hospital previously and recommended to enter a health care facility which he declined at the time.  He is now amenable to placement.  He denies any acute pain.  Denies fever.           Physical Exam   Triage Vital Signs: ED Triage Vitals  Encounter Vitals Group     BP 04/12/23 1049 (!) 155/63     Systolic BP Percentile --      Diastolic BP Percentile --      Pulse Rate 04/12/23 1049 81     Resp 04/12/23 1049 (!) 25     Temp 04/12/23 1049 98.4 F (36.9 C)     Temp Source 04/12/23 1049 Oral     SpO2 04/12/23 1049 (!) 84 %     Weight 04/12/23 1031 (!) 401 lb 3.8 oz (182 kg)     Height 04/12/23 1031 6\' 2"  (1.88 m)     Head Circumference --      Peak Flow --      Pain Score 04/12/23 1031 10     Pain Loc --      Pain Education --      Exclude from Growth Chart --     Most recent vital signs: Vitals:   04/12/23 1324 04/12/23 1328  BP:  (!) 154/66  Pulse:  65  Resp:  (!) 22  Temp:    SpO2: 91% 93%    General: Awake, no distress.  CV:  Good peripheral perfusion.  Regular rate and rhythm Resp:  Normal effort.  Clear to auscultation bilaterally Abd:  No distention.  Soft, nontender Other:  No lower extremity edema   ED Results / Procedures / Treatments   Labs (all labs ordered are listed, but only abnormal results are displayed) Labs Reviewed  BASIC METABOLIC PANEL - Abnormal; Notable  for the following components:      Result Value   Glucose, Bld 127 (*)    BUN 28 (*)    Creatinine, Ser 1.33 (*)    Calcium 8.5 (*)    All other components within normal limits  CBC - Abnormal; Notable for the following components:   RBC 4.03 (*)    Hemoglobin 9.5 (*)    HCT 34.7 (*)    MCH 23.6 (*)    MCHC 27.4 (*)    RDW 21.2 (*)    nRBC 0.3 (*)    All other components within normal limits  RESP PANEL BY RT-PCR (RSV, FLU A&B, COVID)  [id number]  URINALYSIS, ROUTINE W REFLEX MICROSCOPIC  CBG MONITORING, ED     EKG    RADIOLOGY    PROCEDURES:  Procedures   MEDICATIONS ORDERED IN ED: Medications - No data to display   IMPRESSION / MDM / ASSESSMENT AND PLAN / ED COURSE  I reviewed the triage vital signs and the  nursing notes.  DDx: Dehydration, anemia, electrolyte derangement, AKI, pneumonia, COVID, influenza  Patient's presentation is most consistent with acute presentation with potential threat to life or bodily function.  Patient presents with generalized weakness, does report an element of shortness of breath.  Vital signs and exam are unremarkable.  Labs reassuring.  Awaiting COVID/flu swab, urinalysis, chest x-ray.  Will request PT/TOC eval.       FINAL CLINICAL IMPRESSION(S) / ED DIAGNOSES   Final diagnoses:  Generalized weakness  Morbid obesity (HCC)     Rx / DC Orders   ED Discharge Orders     None        Note:  This document was prepared using Dragon voice recognition software and may include unintentional dictation errors.   Sharman Cheek, MD 04/12/23 [phone]

## 2023-04-12 NOTE — ED Notes (Signed)
 Pt observed resting in stretcher at this time. Pt ABCs intact. RR even and unlabored. Pt in NAD. Bed in lowest locked position. Call bell in reach. Denies needs at this time.

## 2023-04-12 NOTE — ED Notes (Signed)
 This RN called pts friend, [name], and let pt talk to her.

## 2023-04-12 NOTE — ED Notes (Signed)
 Pt provided with water and VS taken. Pt ABCs intact. RR even and unlabored on  @ 4 lpm. Pt in NAD. Bed in lowest locked position. Call bell in reach. Denies needs at this time.

## 2023-04-12 NOTE — ED Notes (Signed)
 Pt reporting to ED d/t weakness and inability to properly care for himself. Per report, pts wife is no longer in a position to help care for the pt either. Pt wears 4 lpm via [place] ar baseline but is currently on 5 lpm with O2 saturation of 96% while resting. Pt needs assistance with standing and per report, is unable to walk much.  Pt ABCs intact. RR even but labored. Pt in NAD. Bed in lowest locked position. Call bell in reach. Denies needs at this time.   Past Medical History:  Diagnosis Date   AKI (acute kidney injury) (HCC) 04/28/2017   Arthritis    hands   Cellulitis    CHF (congestive heart failure) (HCC)    Chronic diastolic heart failure (HCC)    Chronic respiratory failure with hypoxia (HCC)    2 L [place] continuously   Diabetes mellitus type 2, insulin dependent (HCC)    Diabetic foot infection (HCC) 06/07/2022   Edema    FEET/LEGS   GERD (gastroesophageal reflux disease)    Hepatic steatosis    HOH (hard of hearing)    Hypertension    Morbid obesity (HCC)    Morbid obesity with BMI of 45.0-49.9, adult (HCC)    Neuropathy    Orthopnea    OSA on CPAP    TRILOGY VENTILATOR   Oxygen deficiency    2L  HS   PVD (peripheral vascular disease) (HCC)    Shortness of breath dyspnea    Slurred speech 03/03/2023   Systolic heart failure (HCC)    Preserved EF 50-55%

## 2023-04-12 NOTE — ED Triage Notes (Signed)
 Patient to ED via [facility] from home for placement due to weakness. EMS called out to home due to wife no longer able to care for patient. PT unable to stand on his own- but this baseline for patient. Wears 4L at baseline. Has known bed sore.  161 cbg 176/46 75 HR

## 2023-04-13 NOTE — ED Notes (Signed)
 Pt ABCs intact. RR even and unlabored. Pt in NAD. Bed in lowest locked position. Call bell in reach. Denies needs at this time.

## 2023-04-13 NOTE — ED Notes (Signed)
 Patient transferred over to bariatric bed.

## 2023-04-13 NOTE — ED Notes (Signed)
 Pt's family member at bedside for a visit

## 2023-04-13 NOTE — Evaluation (Signed)
 Physical Therapy Evaluation Patient Details Name: Raymond [name] MRN: [id number] DOB: May 01, 1965 Today's Date: 04/13/2023  History of Present Illness  Patient is a 58 year old male with multiple admissions in recent months for CVA, STEMI, heart cath, acute on chronic hypoxic respiratory failure. History of morbid obesity, chronic  HFpEF, HTN, OSA on CPAP, CAD, T2DM  Clinical Impression  Pt initially hesitant to try a lot of activity today but with some encouragement and explanation he was willing to work with PT and ultimately did better than he expected.  He needed assist getting to/from sitting/supine but did show good effort and did not need maxA.  Pt maintained standing with walker at EOB for 4-5 minutes with side stepping, marching in place activities and vitals remaining stable.  Pt did fatigue quickly and needed to sit abruptly with this effort.  No ready for ambulation at this time, will benefit from continued PT to address functional limitations.       If plan is discharge home, recommend the following: Two people to help with walking and/or transfers;A lot of help with bathing/dressing/bathroom;Assistance with cooking/housework;Assistance with feeding;Assist for transportation;Supervision due to cognitive status   Can travel by private vehicle   No    Equipment Recommendations  (TBD)  Recommendations for Other Services       Functional Status Assessment Patient has had a recent decline in their functional status and demonstrates the ability to make significant improvements in function in a reasonable and predictable amount of time.     Precautions / Restrictions Precautions Precautions: Fall Restrictions Weight Bearing Restrictions Per Provider Order: No      Mobility  Bed Mobility Overal bed mobility: Needs Assistance Bed Mobility: Supine to Sit, Sit to Supine     Supine to sit: Mod assist, Max assist Sit to supine: Min assist, Mod assist   General bed  mobility comments: Pt showed great effort getting up to sitting- able to get LEs off EOB with only minA, heavy hand held assist to raise trunk up to sitting    Transfers Overall transfer level: Needs assistance Equipment used: Rolling walker (2 wheels) Transfers: Sit to/from Stand Sit to Stand: From elevated surface, Mod assist, Min assist           General transfer comment: elevated bed ~4", pt was able to initiate upward movement but did need direct assist to translate hips forward into walker.    Ambulation/Gait               General Gait Details: Pt quick to fatigue with limited EOB standing acts, did not feel able or confident to attempt stepping away from bed, did so side stepping and marching in place with heavy walker use  Stairs            Wheelchair Mobility     Tilt Bed    Modified Rankin (Stroke Patients Only)       Balance Overall balance assessment: Needs assistance Sitting-balance support: No upper extremity supported Sitting balance-Leahy Scale: Fair Sitting balance - Comments: retro bias, needing direct assist most of the time to insure he does not slowly lean/lose balance backward   Standing balance support: Bilateral upper extremity supported Standing balance-Leahy Scale: Fair Standing balance comment: Once assisted to upright standing he was able to maintain EOB standing with walker reasonably well, did fatigue quickly and need to sit rather abruptly  Pertinent Vitals/Pain Pain Assessment Pain Assessment:  (report vague back pain that is not limiting)    Home Living Family/patient expects to be discharged to:: Unsure Living Arrangements: Spouse/significant other Available Help at Discharge: Family;Available 24 hours/day;Friend(s) Type of Home: Apartment Home Access: Level entry       Home Layout: One level Home Equipment: Agricultural consultant (2 wheels);BSC/3in1 Additional Comments: pt reports  that he has been very limited at home recently and feels he and significant other are unable to care for him appropriately - feeling like he need rehab    Prior Function Prior Level of Function : Needs assist             Mobility Comments: patient reports he has been mostly in the chair since recent d/c home, no walking. Has been able to walk to the bathroom intermittently but with great effort and lack of confidence/safety       Extremity/Trunk Assessment   Upper Extremity Assessment Upper Extremity Assessment: Overall WFL for tasks assessed;Generalized weakness    Lower Extremity Assessment Lower Extremity Assessment: Overall WFL for tasks assessed;Generalized weakness       Communication   Communication Communication: No apparent difficulties    Cognition Arousal: Alert Behavior During Therapy: WFL for tasks assessed/performed   PT - Cognitive impairments: No apparent impairments                         Following commands: Intact       Cueing       General Comments General comments (skin integrity, edema, etc.): Good effort despite not feeling well    Exercises     Assessment/Plan    PT Assessment Patient needs continued PT services  PT Problem List Decreased strength;Decreased activity tolerance;Decreased range of motion;Decreased balance;Decreased mobility;Decreased knowledge of use of DME;Decreased safety awareness;Cardiopulmonary status limiting activity       PT Treatment Interventions DME instruction;Gait training;Functional mobility training;Therapeutic activities;Therapeutic exercise;Balance training;Neuromuscular re-education;Patient/family education    PT Goals (Current goals can be found in the Care Plan section)  Acute Rehab PT Goals Patient Stated Goal: get back to walking well PT Goal Formulation: With patient Time For Goal Achievement: 04/26/23 Potential to Achieve Goals: Fair    Frequency Min 2X/week     Co-evaluation                AM-PAC PT "6 Clicks" Mobility  Outcome Measure Help needed turning from your back to your side while in a flat bed without using bedrails?: A Little Help needed moving from lying on your back to sitting on the side of a flat bed without using bedrails?: A Lot Help needed moving to and from a bed to a chair (including a wheelchair)?: A Lot Help needed standing up from a chair using your arms (e.g., wheelchair or bedside chair)?: A Lot Help needed to walk in hospital room?: A Lot Help needed climbing 3-5 steps with a railing? : Total 6 Click Score: 12    End of Session Equipment Utilized During Treatment: Gait belt;Oxygen (SpO2 99% on 4L on arrival, 2L during activity with SpO2 staying in the mid 90s, nursing notified)   Patient left: with call bell/phone within reach;in bed Nurse Communication: Mobility status PT Visit Diagnosis: Muscle weakness (generalized) (M62.81);Difficulty in walking, not elsewhere classified (R26.2)    Time: 1027-2536 PT Time Calculation (min) (ACUTE ONLY): 22 min   Charges:   PT Evaluation $PT Eval Low Complexity: 1 Low PT Treatments $  Therapeutic Activity: 8-22 mins PT General Charges $$ ACUTE PT VISIT: 1 Visit         Malachi Pro, DPT 04/13/2023, 3:51 PM

## 2023-04-13 NOTE — ED Provider Notes (Signed)
 Emergency Medicine Observation Re-evaluation Note  Raymond [name] is a 58 y.o. male, seen on rounds today.  Pt initially presented to the ED for complaints of Weakness  Currently, the patient is resting comfortably.  Physical Exam  BP (!) 170/63   Pulse 70   Temp 98.4 F (36.9 C) (Oral)   Resp (!) 22   Ht 6\' 2"  (1.88 m)   Wt (!) 182 kg   SpO2 99%   BMI 51.52 kg/m  General: No acute distress Cardiac: Well-perfused extremities Lungs: No respiratory distress Psych: Appropriate mood and affect  ED Course / MDM  EKG:EKG Interpretation Date/Time:  Wednesday April 12 2023 15:56:40 EDT Ventricular Rate:  74 PR Interval:  137 QRS Duration:  110 QT Interval:  420 QTC Calculation: 466 R Axis:   81  Text Interpretation: Sinus rhythm Atrial premature complex Low voltage, precordial leads RSR' in V1 or V2, right VCD or RVH Borderline T abnormalities, anterior leads Confirmed by UNCONFIRMED, DOCTOR ([id number]), editor Lonell Face [id number]) on 04/13/2023 7:10:24 AM  I have reviewed the labs performed to date as well as medications administered while in observation.  Recent changes in the last 24 hours include none.  Plan  Current plan is for placement.   Merwyn Katos, MD 04/13/23 [phone]

## 2023-04-13 NOTE — ED Notes (Signed)
 Regular admission stretcher ordered for patient

## 2023-04-13 NOTE — ED Notes (Signed)
 Dietary called, and a new breakfast order placed for patient. Pt notified of order placement.

## 2023-04-13 NOTE — ED Notes (Addendum)
 Bariatric bed ordered for patient by ED Secretary Drinda Butts. Secretary told that the bed should be here some time this afternoon.

## 2023-04-13 NOTE — ED Notes (Addendum)
 Patient moved over on to a hospital bed. Pt repositioned in bed  with left hip floating on pillow. Pt noted to have skin break down to the left inner buttock. Pt states that he has had the sore on his bottom since his last admission at the hospital. Mepilex pad Pt states that in current position he feels no pain or pressure in area.

## 2023-04-13 NOTE — ED Notes (Signed)
 Patient's family at bedside

## 2023-04-13 NOTE — Progress Notes (Signed)
 Pt removed from CPAP and placed on 4Lnc at this time

## 2023-04-13 NOTE — ED Notes (Signed)
 Patient incontinent of urine. Patient changed by this RN and Connye Burkitt, RN. Patient able to roll from side to side, but requiring assistance. Peri-care performed and male purewick placed to attempt to keep wound area dry. Charge RN aware. Patient alert, pleasant and oriented x4. Denies any further needs at this time. Patient on his chronic 4L at this time, oxygen currently 93%.

## 2023-04-13 NOTE — ED Notes (Signed)
 Pt observed resting in stretcher, sleeping peacefully. Pt ABCs intact. RR even and unlabored on [place] @ 4 lpm. Pt in NAD. Bed in lowest locked position. Call bell in reach. Denies needs at this time.

## 2023-04-13 NOTE — ED Notes (Signed)
 Respiratory at bedside to put pt on CPAP machine for the night.

## 2023-04-13 NOTE — ED Notes (Signed)
 Pt complains of plain to the left buttock 10/10.

## 2023-04-13 NOTE — ED Notes (Signed)
 Regular diet order sent up for patient's breakfast once again. This RN sent order back to dining. This RN called dietary and was told the a heart healthy lunch meal will be sent up for patient. Patient made aware of plan

## 2023-04-13 NOTE — ED Notes (Signed)
 Regular admission bed brought to unit.

## 2023-04-13 NOTE — ED Notes (Signed)
 Introduced self to pt, throw out trash, and plugged up pt's phone.

## 2023-04-13 NOTE — ED Notes (Signed)
 Oxygen 86-88% on chronic 4L- Patient reported using CPAP nightly at home- MD aware and orders placed at this time. Patient placed on 6L via nasal cannula at this time while waiting on CPAP machine.

## 2023-04-14 LAB — CBG MONITORING, ED: Glucose-Capillary: 157 mg/dL — ABNORMAL HIGH (ref 70–99)

## 2023-04-14 MED ORDER — GABAPENTIN 300 MG PO CAPS
300.0000 mg | ORAL_CAPSULE | Freq: Three times a day (TID) | ORAL | Status: DC
  Administered 2023-04-14 – 2023-04-28 (×41): 300 mg via ORAL
  Filled 2023-04-14 (×41): qty 1

## 2023-04-14 MED ORDER — METFORMIN HCL 500 MG PO TABS
1000.0000 mg | ORAL_TABLET | Freq: Two times a day (BID) | ORAL | Status: DC
  Administered 2023-04-15 – 2023-04-28 (×26): 1000 mg via ORAL
  Filled 2023-04-14 (×26): qty 2

## 2023-04-14 MED ORDER — CARVEDILOL 6.25 MG PO TABS
12.5000 mg | ORAL_TABLET | Freq: Two times a day (BID) | ORAL | Status: DC
  Administered 2023-04-15 – 2023-04-28 (×26): 12.5 mg via ORAL
  Filled 2023-04-14 (×27): qty 2

## 2023-04-14 MED ORDER — PANTOPRAZOLE SODIUM 40 MG PO TBEC
40.0000 mg | DELAYED_RELEASE_TABLET | Freq: Every day | ORAL | Status: DC
  Administered 2023-04-14 – 2023-04-28 (×15): 40 mg via ORAL
  Filled 2023-04-14 (×15): qty 1

## 2023-04-14 MED ORDER — IRBESARTAN 150 MG PO TABS
150.0000 mg | ORAL_TABLET | Freq: Every day | ORAL | Status: DC
  Administered 2023-04-14 – 2023-04-28 (×15): 150 mg via ORAL
  Filled 2023-04-14 (×16): qty 1

## 2023-04-14 MED ORDER — ASPIRIN 81 MG PO TBEC
81.0000 mg | DELAYED_RELEASE_TABLET | Freq: Every day | ORAL | Status: DC
  Administered 2023-04-14 – 2023-04-28 (×15): 81 mg via ORAL
  Filled 2023-04-14 (×15): qty 1

## 2023-04-14 MED ORDER — ISOSORBIDE MONONITRATE ER 60 MG PO TB24
60.0000 mg | ORAL_TABLET | Freq: Every day | ORAL | Status: DC
  Administered 2023-04-14 – 2023-04-27 (×14): 60 mg via ORAL
  Filled 2023-04-14 (×14): qty 1

## 2023-04-14 MED ORDER — EMPAGLIFLOZIN 25 MG PO TABS
25.0000 mg | ORAL_TABLET | Freq: Every day | ORAL | Status: DC
  Administered 2023-04-14 – 2023-04-28 (×13): 25 mg via ORAL
  Filled 2023-04-14 (×16): qty 1

## 2023-04-14 MED ORDER — ATORVASTATIN CALCIUM 20 MG PO TABS
40.0000 mg | ORAL_TABLET | Freq: Every day | ORAL | Status: DC
  Administered 2023-04-14 – 2023-04-27 (×14): 40 mg via ORAL
  Filled 2023-04-14 (×13): qty 2

## 2023-04-14 MED ORDER — INSULIN GLARGINE 100 UNIT/ML ~~LOC~~ SOLN
60.0000 [IU] | Freq: Two times a day (BID) | SUBCUTANEOUS | Status: DC
  Administered 2023-04-14 – 2023-04-16 (×5): 60 [IU] via SUBCUTANEOUS
  Filled 2023-04-14 (×6): qty 0.6

## 2023-04-14 NOTE — ED Notes (Signed)
 Pharmacy has completed pt's med req of home meds. MD notified that orders are needed.

## 2023-04-14 NOTE — ED Notes (Signed)
 Patient's bed linens changed and patient repositioned in bed.

## 2023-04-14 NOTE — ED Provider Notes (Signed)
 BP (!) 154/46   Pulse 71   Temp 98.5 F (36.9 C) (Oral)   Resp 18   Ht 6\' 2"  (1.88 m)   Wt (!) 182 kg   SpO2 94%   BMI 51.52 kg/m  No updates.  Awaiting placement.   Willy Eddy, MD 04/14/23 2015

## 2023-04-14 NOTE — ED Notes (Signed)
 Went into pt's room to adjust CPAP mask.

## 2023-04-14 NOTE — [place] FL2 (Signed)
 [place] MEDICAID FL2 LEVEL OF CARE FORM     IDENTIFICATION  Patient Name: Raymond [name] Birthdate: Oct 22, 1965 Sex: male Admission Date (Current Location): 04/12/2023  [facility] and [place] Number:  Chiropodist and Address:  [facility], [address] [place], [place] [address]      Provider Number: [id number]  Attending Physician Name and Address:  No att. providers found  Relative Name and Phone Number:       Current Level of Care: Hospital Recommended Level of Care: Skilled Nursing Facility Prior Approval Number:    Date Approved/Denied:   PASRR Number: [id number]  Discharge Plan: SNF    Current Diagnoses: Patient Active Problem List   Diagnosis Date Noted   NSTEMI (non-ST elevated myocardial infarction) (HCC) 03/03/2023   COPD (chronic obstructive pulmonary disease) (HCC) 03/03/2023   Stroke (HCC) 03/03/2023   Left-sided weakness 02/26/2023   Expressive aphasia 02/26/2023   Pressure injury of skin 02/18/2023   Iron deficiency anemia 02/18/2023   Acute respiratory failure with hypoxia and hypercapnia (HCC) 02/18/2023   Pharyngeal dysphagia 02/15/2023   Acute ischemic stroke (HCC) 02/14/2023   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   Dysarthria due to acute cerebrovascular accident (CVA) (HCC) 02/14/2023   Acute on chronic heart failure with preserved ejection fraction (HFpEF) (HCC) 09/09/2022   Essential hypertension 09/09/2022   Type 2 diabetes mellitus without complications (HCC) 09/09/2022   Dyslipidemia 09/09/2022   GERD without esophagitis 09/09/2022   Adenomatous polyp of colon 12/27/2021   Uncontrolled type 2 diabetes mellitus with hyperglycemia, without long-term current use of insulin (HCC) 02/08/2020   Acute on chronic respiratory failure with hypoxia and hypercapnia (HCC) - suppose to have home Trilogy machine since March 2015 04/28/2017   PVD (peripheral vascular disease)  (HCC) 04/28/2017   Diabetes mellitus type 2, insulin dependent (HCC) 04/28/2017   Chronic diastolic heart failure, NYHA class 1 (HCC) 04/28/2017   Morbid obesity with BMI of 50.0-59.9, adult (HCC) 04/28/2017   Lymphedema 02/11/2017   HTN (hypertension) 02/01/2017   Type 2 diabetes mellitus (HCC) 02/01/2017   COPD, moderate (HCC) 07/07/2014   Chronic diastolic heart failure (HCC) 05/06/2014   OSA treated with Trilogy machine 04/28/2014   Hypoxemia requiring supplemental oxygen 04/28/2014   Morbid obesity (HCC) 04/28/2014   Hyperlipidemia 07/09/2013    Orientation RESPIRATION BLADDER Height & Weight     Self, Time, Situation, Place  O2 (4L [place]. Pt has home NIV.  Asked doctor to order Bipap to determine settings.) Incontinent Weight: (!) 401 lb 3.8 oz (182 kg) Height:  6\' 2"  (188 cm)  BEHAVIORAL SYMPTOMS/MOOD NEUROLOGICAL BOWEL NUTRITION STATUS     (WNL) Continent Diet (Heart diet)  AMBULATORY STATUS COMMUNICATION OF NEEDS Skin   Limited Assist Verbally PU Stage and Appropriate Care     PU Stage 3 Dressing:  (Sacral Medial, no dressing listed)                 Personal Care Assistance Level of Assistance  Bathing, Feeding, Dressing Bathing Assistance: Limited assistance Feeding assistance: Limited assistance Dressing Assistance: Limited assistance     Functional Limitations Info  Sight, Hearing, Speech Sight Info: Adequate Hearing Info: Adequate Speech Info: Adequate    SPECIAL CARE FACTORS FREQUENCY                       Contractures Contractures Info: Not present    Additional Factors Info  Code Status, Allergies Code Status Info:  full Allergies Info: no known allergies           Current Medications (04/14/2023):  This is the current hospital active medication list No current facility-administered medications for this encounter.   Current Outpatient Medications  Medication Sig Dispense Refill   aspirin EC 81 MG tablet Take 81 mg by mouth daily.      atorvastatin (LIPITOR) 40 MG tablet atorvastatin 40 mg tablet     carvedilol (COREG) 12.5 MG tablet Take 1 tablet (12.5 mg total) by mouth 2 (two) times daily with a meal. 60 tablet 2   empagliflozin (JARDIANCE) 25 MG TABS tablet Take by mouth daily. Once daily in the am     Fe Fum-Vit C-Vit B12-FA (TRIGELS-F FORTE) CAPS capsule Take 1 capsule by mouth 2 (two) times daily. 120 capsule 2   gabapentin (NEURONTIN) 300 MG capsule Take 300 mg by mouth 3 (three) times daily.     insulin aspart (NOVOLOG) 100 UNIT/ML injection Inject into the skin 3 (three) times daily before meals.     isosorbide mononitrate (IMDUR) 60 MG 24 hr tablet Take 1 tablet (60 mg total) by mouth at bedtime. 30 tablet 1   LANTUS 100 UNIT/ML injection Inject 0.6 mLs (60 Units total) into the skin 2 (two) times daily. 10 mL 11   metFORMIN (GLUCOPHAGE) 1000 MG tablet Take 1,000 mg by mouth 2 (two) times daily with a meal.     Multiple Vitamins-Minerals (CENTRUM SILVER 50+MEN) TABS Take 1 tablet by mouth daily.     pantoprazole (PROTONIX) 40 MG tablet Take 40 mg by mouth daily.     polyethylene glycol (MIRALAX / GLYCOLAX) 17 g packet Take 17 g by mouth daily as needed for mild constipation. 14 each 0   tamsulosin (FLOMAX) 0.4 MG CAPS capsule Take 1 capsule (0.4 mg total) by mouth daily after supper. 30 capsule 2   torsemide (DEMADEX) 20 MG tablet Take 2 tablets (40 mg total) by mouth daily. 60 tablet 2   valsartan (DIOVAN) 160 MG tablet Take 160 mg by mouth daily.     BYETTA 10 MCG PEN 10 MCG/0.04ML SOPN injection Inject 10 mcg into the skin 2 (two) times daily with a meal. (Patient not taking: Reported on 04/12/2023)     OXYGEN Inhale 2 L into the lungs daily.     TRUEPLUS INSULIN SYRINGE 29G X 1/2" 1 ML MISC      TRUEPLUS PEN NEEDLES 31G X 8 MM MISC        Discharge Medications: Please see discharge summary for a list of discharge medications.  Relevant Imaging Results:  Relevant Lab Results:   Additional Information     Zaylie Gisler D Kassadie Pancake, LCSW

## 2023-04-14 NOTE — ED Notes (Signed)
 Respiratory called to come hook up pt on BiPAP so pt can go to sleep.

## 2023-04-14 NOTE — ED Notes (Signed)
 Pt asked this RN why he is not getting any of his routine home medications while here. RN told pt I would check into this for him.

## 2023-04-14 NOTE — TOC Progression Note (Signed)
 Transition of Care [facility]) - Progression Note    Patient Details  Name: Raymond [name] MRN: [id number] Date of Birth: 27-Apr-1965  Transition of Care [facility]) CM/SW Contact  Colin Broach, LCSW Phone Number: 04/14/2023, 10:38 AM  Clinical Narrative:     CSW visited pt room twice to discuss d/c plan for SNF. He's been sleep both times and not aroused.  Pt wearing CPAP.  CSW will attempt to reach pt again after lunchtime.       Expected Discharge Plan and Services                                               Social Determinants of Health (SDOH) Interventions SDOH Screenings   Food Insecurity: No Food Insecurity (03/04/2023)  Housing: Low Risk  (03/04/2023)  Transportation Needs: No Transportation Needs (03/04/2023)  Utilities: Not At Risk (03/04/2023)  Tobacco Use: Low Risk  (04/12/2023)    Readmission Risk Interventions     No data to display

## 2023-04-15 LAB — CBG MONITORING, ED: Glucose-Capillary: 125 mg/dL — ABNORMAL HIGH (ref 70–99)

## 2023-04-15 MED ORDER — ACETAMINOPHEN 325 MG PO TABS
650.0000 mg | ORAL_TABLET | Freq: Four times a day (QID) | ORAL | Status: DC | PRN
  Administered 2023-04-15 (×2): 650 mg via ORAL
  Filled 2023-04-15 (×2): qty 2

## 2023-04-15 NOTE — ED Provider Notes (Signed)
 Emergency Medicine Observation Re-evaluation Note  Physical Exam   BP (!) 154/46   Pulse 71   Temp 98.5 F (36.9 C) (Oral)   Resp 18   Ht 6\' 2"  (1.88 m)   Wt (!) 182 kg   SpO2 95%   BMI 51.52 kg/m   Patient appears in no acute distress.  ED Course / MDM   No reported events during my shift at the time of this note.   Pt is awaiting dispo from consultants   Pilar Jarvis MD    Pilar Jarvis, MD 04/15/23 [phone]

## 2023-04-15 NOTE — ED Notes (Signed)
 Pt eating from dinner tray independently at this time.

## 2023-04-15 NOTE — ED Notes (Addendum)
 Marland Kitchen

## 2023-04-15 NOTE — ED Notes (Signed)
TOC Placement Pending

## 2023-04-15 NOTE — ED Notes (Signed)
 Pt placed on bipap by respiratory

## 2023-04-15 NOTE — ED Notes (Signed)
 Respiratory called and informed that pt's O2 on bipap needs to be adjusted. Pt sating in the mid 80s. Respiratory is coming to adjust pt's machine.

## 2023-04-16 LAB — CBG MONITORING, ED: Glucose-Capillary: 110 mg/dL — ABNORMAL HIGH (ref 70–99)

## 2023-04-16 NOTE — ED Notes (Signed)
 Answered call light, pt is asking for his CPAP to be put on, RN Notified.

## 2023-04-16 NOTE — ED Provider Notes (Signed)
 Emergency Medicine Observation Re-evaluation Note  Raymond [name] is a 58 y.o. male, seen on rounds today.  Pt initially presented to the ED for complaints of Weakness  Currently, the patient is resting in bed. No reported issues from nursing team.   Physical Exam  BP (!) 151/63 (BP Location: Right Arm)   Pulse 75   Temp 98.1 F (36.7 C) (Oral)   Resp 20   Ht 6\' 2"  (1.88 m)   Wt (!) 182 kg   SpO2 97%   BMI 51.52 kg/m  Physical Exam General: Resting in bed  ED Course / MDM   BGL 125-157 over the past 2 days.  Plan  Current plan is for dispo per social work.    Trinna Post, MD 04/16/23 [phone]

## 2023-04-17 LAB — CBG MONITORING, ED
Glucose-Capillary: 44 mg/dL — CL (ref 70–99)
Glucose-Capillary: 58 mg/dL — ABNORMAL LOW (ref 70–99)
Glucose-Capillary: 80 mg/dL (ref 70–99)
Glucose-Capillary: 109 mg/dL — ABNORMAL HIGH (ref 70–99)
Glucose-Capillary: 127 mg/dL — ABNORMAL HIGH (ref 70–99)
Glucose-Capillary: 155 mg/dL — ABNORMAL HIGH (ref 70–99)
Glucose-Capillary: 137 mg/dL — ABNORMAL HIGH (ref 70–99)

## 2023-04-17 MED ORDER — INSULIN ASPART 100 UNIT/ML IJ SOLN
0.0000 [IU] | Freq: Every day | INTRAMUSCULAR | Status: DC
  Administered 2023-04-22: 2 [IU] via SUBCUTANEOUS
  Administered 2023-04-27: 0 [IU] via SUBCUTANEOUS
  Filled 2023-04-17 (×2): qty 1

## 2023-04-17 MED ORDER — INSULIN GLARGINE 100 UNIT/ML ~~LOC~~ SOLN
30.0000 [IU] | Freq: Two times a day (BID) | SUBCUTANEOUS | Status: DC
  Administered 2023-04-17 – 2023-04-25 (×10): 30 [IU] via SUBCUTANEOUS
  Filled 2023-04-17 (×17): qty 0.3

## 2023-04-17 MED ORDER — INSULIN ASPART 100 UNIT/ML IJ SOLN
0.0000 [IU] | Freq: Three times a day (TID) | INTRAMUSCULAR | Status: DC
  Administered 2023-04-17: 2 [IU] via SUBCUTANEOUS
  Administered 2023-04-17 – 2023-04-18 (×2): 3 [IU] via SUBCUTANEOUS
  Administered 2023-04-18: 1 [IU] via SUBCUTANEOUS
  Administered 2023-04-19: 2 [IU] via SUBCUTANEOUS
  Administered 2023-04-20: 3 [IU] via SUBCUTANEOUS
  Administered 2023-04-20: 2 [IU] via SUBCUTANEOUS
  Administered 2023-04-21 (×2): 3 [IU] via SUBCUTANEOUS
  Administered 2023-04-22: 2 [IU] via SUBCUTANEOUS
  Administered 2023-04-22: 3 [IU] via SUBCUTANEOUS
  Administered 2023-04-22: 2 [IU] via SUBCUTANEOUS
  Administered 2023-04-23 (×2): 3 [IU] via SUBCUTANEOUS
  Administered 2023-04-23 – 2023-04-24 (×2): 2 [IU] via SUBCUTANEOUS
  Administered 2023-04-25: 3 [IU] via SUBCUTANEOUS
  Administered 2023-04-25: 5 [IU] via SUBCUTANEOUS
  Administered 2023-04-26 (×3): 3 [IU] via SUBCUTANEOUS
  Administered 2023-04-27: 2 [IU] via SUBCUTANEOUS
  Administered 2023-04-27 (×2): 5 [IU] via SUBCUTANEOUS
  Administered 2023-04-28 (×2): 2 [IU] via SUBCUTANEOUS
  Filled 2023-04-17 (×27): qty 1

## 2023-04-17 NOTE — ED Notes (Signed)
 Pt eating lunch

## 2023-04-17 NOTE — Progress Notes (Signed)
 PT Cancellation Note  Patient Details Name: Raymond [name] MRN: [id number] DOB: 25-Oct-1965   Cancelled Treatment:    Reason Eval/Treat Not Completed: Medical issues which prohibited therapy (Per chart, most recent CBG: 44, NSG aware. WIll defer PT services to more appropriate date/time.)   9:46 AM, 04/17/23 Rosamaria Lints, PT, DPT Physical Therapist - [facility]  [phone] (ASCOM)    Calysta Craigo C 04/17/2023, 9:46 AM

## 2023-04-17 NOTE — ED Provider Notes (Deleted)
-----------------------------------------   8:13 PM on 04/17/2023 -----------------------------------------   Blood pressure 134/63, pulse 65, temperature 98 F (36.7 C), resp. rate 20, height 6\' 2"  (1.88 m), weight (!) 182 kg, SpO2 95%.  The patient is calm and cooperative at this time.  There have been no acute events since the last update.  Awaiting disposition plan from psych team.   Delton Prairie, MD 04/17/23 2013

## 2023-04-17 NOTE — Inpatient Diabetes Management (Signed)
 Inpatient Diabetes Program Recommendations  AACE/ADA: New Consensus Statement on Inpatient Glycemic Control   Target Ranges:  Prepandial:   less than 140 mg/dL      Peak postprandial:   less than 180 mg/dL (1-2 hours)      Critically ill patients:  140 - 180 mg/dL    Latest Reference Range & Units 04/12/23 13:32 04/14/23 19:34 04/15/23 22:11 04/16/23 21:39 04/17/23 09:22 04/17/23 09:37 04/17/23 09:55  Glucose-Capillary 70 - 99 mg/dL 95 161 (H) 096 (H) 045 (H) 44 (LL) 58 (L) 80    Review of Glycemic Control  Diabetes history: DM2 Outpatient Diabetes medications: Lantus 60 units BID, Jardiance 25 mg daily, Metformin 1000 mg BID Current orders for Inpatient glycemic control: Lantus 60 units BID, Jardiance 25 mg daily, Metformin 1000 mg BID  Inpatient Diabetes Program Recommendations:    Insulin: Noted CBGs not being done consistently. CBG 44 mg/dl at 4:09 am today. Please consider decreasing Lantus to 30 units BID, ordering CBGs AC&HS, and Novolog 0-15 units TID with meals and Novolog 0-5 units at bedtime.  Diet: Please add Carb Modified to Heart Healthy diet order.  NOTE: Patient holding in ED for SNF placement. CBG 44 mg/dl at 8:11 am today. Patient last received Lantus 60 units at 21:42 on 04/16/23.   Thanks, Orlando Penner, RN, MSN, CDCES Diabetes Coordinator Inpatient Diabetes Program [phone] (Team Pager from 8am to 5pm)

## 2023-04-17 NOTE — ED Notes (Signed)
 Dressing applied to pt's pressure wound on buttocks

## 2023-04-17 NOTE — ED Notes (Signed)
 Pt cbg 44. Alert and oriented. Given OJ and eating breakfast tray. Dr Roxan Hockey notified. Will hold AM insulin.

## 2023-04-17 NOTE — Evaluation (Signed)
 Occupational Therapy Evaluation Patient Details Name: Raymond [name] MRN: [id number] DOB: 09-12-1965 Today's Date: 04/17/2023   History of Present Illness   Patient is a 58 year old male with multiple admissions in recent months for CVA, STEMI, heart cath, acute on chronic hypoxic respiratory failure. History of morbid obesity, chronic  HFpEF, HTN, OSA on CPAP, CAD, T2DM     Clinical Impressions Pt seen for OT eval this date, pleasant but limited by fatigue. PTA, pt recently discharged from hospital and has been primarily in lift chair, only able to sometimes walk short distances to bathroom using RW. Pt states he was sponge bathing from shower seat and performing ADLs with mod independence. Pt requires maxA +2 to roll to check for cleanliness and for paramedic to place dressing on pressure wound. Anticipate pt will require up to MOD A for UB dressing, up to TOTAL A for LB ADLs and will require +2 for OOB mobility due to body habitus, generalized weakness and impaired balance. Pt would benefit from skilled OT services to address noted impairments and functional limitations (see below for any additional details) in order to maximize safety and independence while minimizing falls risk and caregiver burden. Anticipate the need for follow up OT services upon acute hospital DC.      If plan is discharge home, recommend the following:   Two people to help with walking and/or transfers;Two people to help with bathing/dressing/bathroom;Assist for transportation     Functional Status Assessment   Patient has had a recent decline in their functional status and demonstrates the ability to make significant improvements in function in a reasonable and predictable amount of time.     Equipment Recommendations   None recommended by OT (defer)      Precautions/Restrictions   Precautions Precautions: Fall Restrictions Weight Bearing Restrictions Per Provider Order: No      Mobility Bed Mobility Overal bed mobility: Needs Assistance Bed Mobility: Rolling Rolling: Max assist, +2 for physical assistance, Used rails         General bed mobility comments: Pt able to roll with maxA+2 to check for cleanliness    Transfers Overall transfer level: Needs assistance                 General transfer comment: NT this date      Balance Overall balance assessment: Needs assistance                                         ADL either performed or assessed with clinical judgement   ADL Overall ADL's : Needs assistance/impaired Eating/Feeding: Set up;Bed level   Grooming: Bed level;Set up                                 General ADL Comments: Pt able to complete feeding/grooming tasks bed level, will require up to maxA for ADLs at this time due to weakness/body habitus      Pertinent Vitals/Pain Pain Assessment Pain Assessment: No/denies pain     Extremity/Trunk Assessment Upper Extremity Assessment Upper Extremity Assessment: Generalized weakness           Communication Communication Communication: No apparent difficulties   Cognition Arousal: Alert Behavior During Therapy: [facility] for tasks assessed/performed  Following commands: Intact       Cueing  General Comments   Cueing Techniques: Verbal cues              Home Living Family/patient expects to be discharged to:: Skilled nursing facility Living Arrangements: Spouse/significant other Available Help at Discharge: Family;Available 24 hours/day;Friend(s) Type of Home: Apartment Home Access: Level entry     Home Layout: One level     Bathroom Shower/Tub: Tub/shower unit         Home Equipment: Agricultural consultant (2 wheels);BSC/3in1;Lift chair          Prior Functioning/Environment Prior Level of Function : Needs assist             Mobility Comments: pt reports he can walk t/f  bathroom with RW sometimes but has been mostly in lift chair ADLs Comments: pt states he can perform ADLs mod independent (sponge bathes in bathroom, is able to "just do it" for toileting and LB dressing (questionable given current level of weakness)    OT Problem List: Decreased strength;Decreased range of motion;Decreased activity tolerance;Impaired balance (sitting and/or standing);Obesity;Cardiopulmonary status limiting activity;Decreased knowledge of use of DME or AE   OT Treatment/Interventions: Self-care/ADL training;Therapeutic exercise;Neuromuscular education;DME and/or AE instruction;Energy conservation;Therapeutic activities;Balance training;Patient/family education      OT Goals(Current goals can be found in the care plan section)   Acute Rehab OT Goals OT Goal Formulation: With patient Time For Goal Achievement: 05/01/23 Potential to Achieve Goals: Good   OT Frequency:  Min 2X/week       AM-PAC OT "6 Clicks" Daily Activity     Outcome Measure Help from another person eating meals?: A Little Help from another person taking care of personal grooming?: A Little Help from another person toileting, which includes using toliet, bedpan, or urinal?: Total Help from another person bathing (including washing, rinsing, drying)?: Total Help from another person to put on and taking off regular upper body clothing?: Total Help from another person to put on and taking off regular lower body clothing?: Total 6 Click Score: 10   End of Session Equipment Utilized During Treatment: Oxygen Nurse Communication: Mobility status  Activity Tolerance: Patient limited by fatigue Patient left: in bed;with call bell/phone within reach  OT Visit Diagnosis: Muscle weakness (generalized) (M62.81);Other abnormalities of gait and mobility (R26.89)                Time: 4696-2952 OT Time Calculation (min): 14 min Charges:  OT General Charges $OT Visit: 1 Visit OT Evaluation $OT Eval Low  Complexity: 1 Low  Reinhold Rickey L. Meer Reindl, OTR/L  04/17/23, 4:44 PM

## 2023-04-17 NOTE — ED Notes (Signed)
 Assisted on to bedpan.

## 2023-04-17 NOTE — ED Notes (Signed)
 Pt linens changed, peri care provided. Large BM noted.

## 2023-04-17 NOTE — ED Provider Notes (Signed)
-----------------------------------------   8:13 PM on 04/17/2023 -----------------------------------------   Blood pressure 134/63, pulse 65, temperature 98 F (36.7 C), resp. rate 20, height 6\' 2"  (1.88 m), weight (!) 182 kg, SpO2 95%.  The patient is calm and cooperative at this time.  There have been no acute events since the last update.  Awaiting disposition plan from case management/social work.   Delton Prairie, MD 04/17/23 2013

## 2023-04-17 NOTE — ED Notes (Signed)
 Pt placed on bipap by respiratory for the night.

## 2023-04-18 LAB — CBG MONITORING, ED
Glucose-Capillary: 104 mg/dL — ABNORMAL HIGH (ref 70–99)
Glucose-Capillary: 178 mg/dL — ABNORMAL HIGH (ref 70–99)
Glucose-Capillary: 126 mg/dL — ABNORMAL HIGH (ref 70–99)
Glucose-Capillary: 117 mg/dL — ABNORMAL HIGH (ref 70–99)

## 2023-04-18 NOTE — ED Notes (Signed)
 OT at bedside.

## 2023-04-18 NOTE — Progress Notes (Signed)
 Physical Therapy Treatment Patient Details Name: Raymond [name] MRN: [id number] DOB: Feb 28, 1965 Today's Date: 04/18/2023   History of Present Illness Patient is a 58 year old male with multiple admissions in recent months for CVA, STEMI, heart cath, acute on chronic hypoxic respiratory failure. History of morbid obesity, chronic  HFpEF, HTN, OSA on CPAP, CAD, T2DM    PT Comments  Pt was long sitting in bed upon arrival. He agrees to PT/OT co-treat. Pt was on 4 L at rest with sao2 > 90% however pt was increase to 6L for ambulation. He still desaturates to ~ 84% but recovers with seated rest and breathing techniques. Eventually able to return to 4 L with sao2 stable. Pt is progressing towards goals but remains overall severely deconditioned and weak. He reports being agreeable to STR at D[facility] if able.  Acute PT will continue to follow per current POC.    If plan is discharge home, recommend the following: A lot of help with walking and/or transfers;A lot of help with bathing/dressing/bathroom;Assistance with cooking/housework;Assist for transportation;Help with stairs or ramp for entrance     Equipment Recommendations  None recommended by PT (pt endorses having all equipment needs met)       Precautions / Restrictions Precautions Precautions: Fall Restrictions Weight Bearing Restrictions Per Provider Order: No     Mobility  Bed Mobility Overal bed mobility: Needs Assistance Bed Mobility: Supine to Sit, Sit to Supine  General bed mobility comments: pt sleeps in lift chair at baseline but was able to exit L side of bed with mod assist of one. heavy use of bed rail and functions    Transfers Overall transfer level: Needs assistance Equipment used: Rolling walker (2 wheels) (bariatric) Transfers: Sit to/from Stand Sit to Stand: Min assist  General transfer comment: 4 trials total, initially requiring mod A of 2, progressing each trial, ultimately performing with CGA x2 by 3rd  and 4th trial.  Vc for safe hand placement.    Ambulation/Gait Ambulation/Gait assistance: Contact guard assist Gait Distance (Feet): 15 Feet Assistive device: Rolling walker (2 wheels) (Bariatric) Gait Pattern/deviations: Step-through pattern Gait velocity: decreased  General Gait Details: Distance limited by fatigue. Sao2 stable throughout most of session but did desaturate 1 x with gait. Was able to recover to > 90% without increase in O2 supply   Balance Overall balance assessment: Needs assistance Sitting-balance support: No upper extremity supported Sitting balance-Leahy Scale: Fair     Standing balance support: Bilateral upper extremity supported Standing balance-Leahy Scale: Fair     Hotel manager: No apparent difficulties  Cognition Arousal: Alert Behavior During Therapy: WFL for tasks assessed/performed   PT - Cognitive impairments: No apparent impairments    PT - Cognition Comments: Pt is A and O x 4. Seems to have great insight of his deficits and PLOF. Following commands: Intact      Cueing Cueing Techniques: Verbal cues     General Comments General comments (skin integrity, edema, etc.): 02 sats low-mid 90s on 4L at rest at start of session; increased 02 to 6L for OOB activity.  Brief desat to 83-85% on 6L after ambulation around bed, but returned to mid 90s with ~1 min of PLB once seated back on EOB.  Returned 02 to 4L end of session.      Pertinent Vitals/Pain Pain Assessment Pain Assessment: No/denies pain     PT Goals (current goals can now be found in the care plan section) Acute Rehab PT Goals Patient Stated Goal:  rehab then home Progress towards PT goals: Progressing toward goals    Frequency    Min 2X/week           Co-evaluation   Reason for Co-Treatment: For patient/therapist safety;To address functional/ADL transfers PT goals addressed during session: Mobility/safety with mobility OT goals addressed  during session: ADL's and self-care      AM-PAC PT "6 Clicks" Mobility   Outcome Measure  Help needed turning from your back to your side while in a flat bed without using bedrails?: A Little Help needed moving from lying on your back to sitting on the side of a flat bed without using bedrails?: A Little Help needed moving to and from a bed to a chair (including a wheelchair)?: A Lot Help needed standing up from a chair using your arms (e.g., wheelchair or bedside chair)?: A Lot Help needed to walk in hospital room?: A Lot Help needed climbing 3-5 steps with a railing? : Total 6 Click Score: 13    End of Session Equipment Utilized During Treatment: Oxygen Activity Tolerance: Patient limited by fatigue;Patient tolerated treatment well Patient left: in bed;with call bell/phone within reach;with bed alarm set Nurse Communication: Mobility status PT Visit Diagnosis: Muscle weakness (generalized) (M62.81);Difficulty in walking, not elsewhere classified (R26.2)     Time: 9528-4132 PT Time Calculation (min) (ACUTE ONLY): 39 min  Charges:    $Therapeutic Activity: 8-22 mins PT General Charges $$ ACUTE PT VISIT: 1 Visit                     Jetta Lout PTA 04/18/23, 4:38 PM

## 2023-04-18 NOTE — Progress Notes (Signed)
 Occupational Therapy Treatment Patient Details Name: Raymond [name] MRN: [id number] DOB: [birth year] Today's Date: 04/18/2023   History of present illness Patient is a 58 year old male with multiple admissions in recent months for CVA, STEMI, heart cath, acute on chronic hypoxic respiratory failure. History of morbid obesity, chronic  HFpEF, HTN, OSA on CPAP, CAD, T2DM   OT comments  Pt in bed upon OT arrival, agreeable to OT/PT cotx.  Pt demonstrated good motivation throughout session and verbalizes eagerness to "get better."  Pt tolerated BADLs in sitting, and pericare in standing; see below for ADL details.  Pt completed 4-5 standing trials total, progressing from initial mod A of 2 to CGA of 2 by 3rd and 4th trial using bariatric RW.  Ambulatory around bedside to complete session, with CGA.  Brief desat of 02 during activity on 6L (mid 80s), but quick return to mid 90s once seated and cued for PLB.  Pt will continue to benefit from skilled OT in the acute setting and upon d/c in order to maximize participation and indep with daily tasks, as he currently relies on significant other for BADL/IADL assist d/t generalized weakness and deconditioning.        If plan is discharge home, recommend the following:  Two people to help with walking and/or transfers;Two people to help with bathing/dressing/bathroom;Assist for transportation;Assistance with cooking/housework;Help with stairs or ramp for entrance   Equipment Recommendations  Other (comment) (defer to next venue of care)    Recommendations for Other Services      Precautions / Restrictions Precautions Precautions: Fall Restrictions Weight Bearing Restrictions Per Provider Order: No       Mobility Bed Mobility Overal bed mobility: Needs Assistance       Supine to sit: Mod assist Sit to supine: Supervision, HOB elevated, Used rails   General bed mobility comments: Pt able to transition legs in/out of bed with extra  time each direction, but required assist to transition trunk from supine to sit (mod A); pt able to lower trunk sit>supine with increased time. Patient Response: Cooperative  Transfers Overall transfer level: Needs assistance Equipment used: Rolling walker (2 wheels) Transfers: Sit to/from Stand Sit to Stand: Min assist           General transfer comment: 4 trials total, initially requiring mod A of 2, progressing each trial, ultimately performing with CGA x2 by 3rd and 4th trial.  Vc for safe hand placement.     Balance Overall balance assessment: Needs assistance Sitting-balance support: No upper extremity supported Sitting balance-Leahy Scale: Fair Sitting balance - Comments: Performed BADLs seated EOB, with steady sitting reaching within BOS   Standing balance support: Bilateral upper extremity supported Standing balance-Leahy Scale: Fair Standing balance comment: Tolerated standing for peri hygiene with BUE support on walker                           ADL either performed or assessed with clinical judgement   ADL Overall ADL's : Needs assistance/impaired     Grooming: Wash/dry face;Set up;Sitting Grooming Details (indicate cue type and reason): EOB for OT to wash back while pt washed face         Upper Body Dressing : Minimal assistance;Sitting Upper Body Dressing Details (indicate cue type and reason): gown change sitting EOB         Toileting- Clothing Manipulation and Hygiene: Maximal assistance;Sit to/from stand Toileting - Clothing Manipulation Details (indicate cue type and reason):  Pt able to stand with min guard while RN assisted with wiping buttocks; pt with limited reach d/t body habitus and reports SO assists with pericare at baseline     Functional mobility during ADLs: +2 for safety/equipment;Contact guard assist;Rolling walker (2 wheels) General ADL Comments: Pt performed sit to stand x4 trials, beginning with mod A of 2 from heighted bed,  progressing to min A of 2 and min guard of 2 using bariatric RW.  Ambulated around bed with CGA of 1 with therapists assisting pt back to bed.    Extremity/Trunk Assessment Upper Extremity Assessment Upper Extremity Assessment: Generalized weakness   Lower Extremity Assessment Lower Extremity Assessment: Generalized weakness                         Communication Communication Communication: No apparent difficulties   Cognition Arousal: Alert Behavior During Therapy: WFL for tasks assessed/performed                                 Following commands: Intact        Cueing   Cueing Techniques: Verbal cues  Exercises Other Exercises Other Exercises: Reinforced benefits of frequent positional changes for PU prevention           General Comments 02 sats low-mid 90s on 4L at rest at start of session; increased 02 to 6L for OOB activity.  Brief desat to 83-85% on 6L after ambulation around bed, but returned to mid 90s with ~1 min of PLB once seated back on EOB.  Returned 02 to 4L end of session.    Pertinent Vitals/ Pain       Pain Assessment Pain Assessment: No/denies pain  Home Living                                          Prior Functioning/Environment              Frequency  Min 2X/week        Progress Toward Goals  OT Goals(current goals can now be found in the care plan section)  Progress towards OT goals: Progressing toward goals  Acute Rehab OT Goals OT Goal Formulation: With patient Time For Goal Achievement: 05/01/23 Potential to Achieve Goals: Good  Plan      Co-evaluation    PT/OT/SLP Co-Evaluation/Treatment: Yes Reason for Co-Treatment: For patient/therapist safety;To address functional/ADL transfers PT goals addressed during session: Mobility/safety with mobility OT goals addressed during session: ADL's and self-care      AM-PAC OT "6 Clicks" Daily Activity     Outcome Measure   Help from  another person eating meals?: None Help from another person taking care of personal grooming?: A Little Help from another person toileting, which includes using toliet, bedpan, or urinal?: Total Help from another person bathing (including washing, rinsing, drying)?: A Lot Help from another person to put on and taking off regular upper body clothing?: A Little Help from another person to put on and taking off regular lower body clothing?: A Lot 6 Click Score: 15    End of Session Equipment Utilized During Treatment: Oxygen;Gait belt;Rolling walker (2 wheels)  OT Visit Diagnosis: Muscle weakness (generalized) (M62.81);Other abnormalities of gait and mobility (R26.89)   Activity Tolerance Patient limited by fatigue;Other (comment) (mild dyspnea with exertion)  Patient Left in bed;with call bell/phone within reach   Nurse Communication Mobility status        Time: 1601-0932 OT Time Calculation (min): 39 min  Charges: OT General Charges $OT Visit: 1 Visit OT Treatments $Self Care/Home Management : 23-37 mins  Danelle Earthly, MS, OTR/L   Otis Dials 04/18/2023, 3:35 PM

## 2023-04-18 NOTE — ED Notes (Signed)
 While OT at bedside to stand pt peri care performed. New sacral pad placed.

## 2023-04-18 NOTE — [place] FL2 (Signed)
 [place] MEDICAID FL2 LEVEL OF CARE FORM     IDENTIFICATION  Patient Name: Raymond [name] Birthdate: Jan 29, 1966 Sex: male Admission Date (Current Location): 04/12/2023  [facility] and [place] Number:  Chiropodist and Address:  [facility], [address] [place], [place] [address]      Provider Number: [id number]  Attending Physician Name and Address:  No att. providers found  Relative Name and Phone Number:       Current Level of Care: Hospital Recommended Level of Care: Skilled Nursing Facility Prior Approval Number:    Date Approved/Denied:   PASRR Number: [id number]  Discharge Plan: SNF    Current Diagnoses: Patient Active Problem List   Diagnosis Date Noted   NSTEMI (non-ST elevated myocardial infarction) (HCC) 03/03/2023   COPD (chronic obstructive pulmonary disease) (HCC) 03/03/2023   Stroke (HCC) 03/03/2023   Left-sided weakness 02/26/2023   Expressive aphasia 02/26/2023   Pressure injury of skin 02/18/2023   Iron deficiency anemia 02/18/2023   Acute respiratory failure with hypoxia and hypercapnia (HCC) 02/18/2023   Pharyngeal dysphagia 02/15/2023   Acute ischemic stroke (HCC) 02/14/2023   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   Dysarthria due to acute cerebrovascular accident (CVA) (HCC) 02/14/2023   Acute on chronic heart failure with preserved ejection fraction (HFpEF) (HCC) 09/09/2022   Essential hypertension 09/09/2022   Type 2 diabetes mellitus without complications (HCC) 09/09/2022   Dyslipidemia 09/09/2022   GERD without esophagitis 09/09/2022   Adenomatous polyp of colon 12/27/2021   Uncontrolled type 2 diabetes mellitus with hyperglycemia, without long-term current use of insulin (HCC) 02/08/2020   Acute on chronic respiratory failure with hypoxia and hypercapnia (HCC) - suppose to have home Trilogy machine since March 2015 04/28/2017   PVD (peripheral vascular disease)  (HCC) 04/28/2017   Diabetes mellitus type 2, insulin dependent (HCC) 04/28/2017   Chronic diastolic heart failure, NYHA class 1 (HCC) 04/28/2017   Morbid obesity with BMI of 50.0-59.9, adult (HCC) 04/28/2017   Lymphedema 02/11/2017   HTN (hypertension) 02/01/2017   Type 2 diabetes mellitus (HCC) 02/01/2017   COPD, moderate (HCC) 07/07/2014   Chronic diastolic heart failure (HCC) 05/06/2014   OSA treated with Trilogy machine 04/28/2014   Hypoxemia requiring supplemental oxygen 04/28/2014   Morbid obesity (HCC) 04/28/2014   Hyperlipidemia 07/09/2013    Orientation RESPIRATION BLADDER Height & Weight     Self, Time, Situation, Place  O2 (6 L with ambulation, 4 L at rest. Bipap QHS: 15/10 at 45%.) Incontinent Weight: (!) 401 lb 3.8 oz (182 kg) Height:  6\' 2"  (188 cm)  BEHAVIORAL SYMPTOMS/MOOD NEUROLOGICAL BOWEL NUTRITION STATUS     (WNL) Continent Diet (Heart diet)  AMBULATORY STATUS COMMUNICATION OF NEEDS Skin   Limited Assist Verbally PU Stage and Appropriate Care     PU Stage 3 Dressing:  (Sacral Medial, no dressing listed)                 Personal Care Assistance Level of Assistance  Bathing, Feeding, Dressing Bathing Assistance: Limited assistance Feeding assistance: Limited assistance Dressing Assistance: Limited assistance     Functional Limitations Info  Sight, Hearing, Speech Sight Info: Adequate Hearing Info: Adequate Speech Info: Adequate    SPECIAL CARE FACTORS FREQUENCY                       Contractures Contractures Info: Not present    Additional Factors Info  Code Status, Allergies Code Status Info: full Allergies  Info: no known allergies           Current Medications (04/18/2023):  This is the current hospital active medication list Current Facility-Administered Medications  Medication Dose Route Frequency Provider Last Rate Last Admin   acetaminophen (TYLENOL) tablet 650 mg  650 mg Oral Q6H PRN Dionne Bucy, MD   650 mg at  04/15/23 1723   aspirin EC tablet 81 mg  81 mg Oral Daily Willy Eddy, MD   81 mg at 04/18/23 1121   atorvastatin (LIPITOR) tablet 40 mg  40 mg Oral Daily Willy Eddy, MD   40 mg at 04/17/23 1836   carvedilol (COREG) tablet 12.5 mg  12.5 mg Oral BID WC Willy Eddy, MD   12.5 mg at 04/18/23 1120   empagliflozin (JARDIANCE) tablet 25 mg  25 mg Oral Daily Willy Eddy, MD   25 mg at 04/18/23 1121   gabapentin (NEURONTIN) capsule 300 mg  300 mg Oral TID Willy Eddy, MD   300 mg at 04/18/23 1121   insulin aspart (novoLOG) injection 0-15 Units  0-15 Units Subcutaneous TID WC Willy Eddy, MD   3 Units at 04/18/23 1235   insulin aspart (novoLOG) injection 0-5 Units  0-5 Units Subcutaneous QHS Willy Eddy, MD       insulin glargine (LANTUS) injection 30 Units  30 Units Subcutaneous BID Willy Eddy, MD   30 Units at 04/18/23 1122   irbesartan (AVAPRO) tablet 150 mg  150 mg Oral Daily Willy Eddy, MD   150 mg at 04/18/23 1121   isosorbide mononitrate (IMDUR) 24 hr tablet 60 mg  60 mg Oral QHS Willy Eddy, MD   60 mg at 04/17/23 2221   metFORMIN (GLUCOPHAGE) tablet 1,000 mg  1,000 mg Oral BID WC Willy Eddy, MD   1,000 mg at 04/18/23 1121   pantoprazole (PROTONIX) EC tablet 40 mg  40 mg Oral Daily Willy Eddy, MD   40 mg at 04/18/23 1122   Current Outpatient Medications  Medication Sig Dispense Refill   aspirin EC 81 MG tablet Take 81 mg by mouth daily.     atorvastatin (LIPITOR) 40 MG tablet atorvastatin 40 mg tablet     carvedilol (COREG) 12.5 MG tablet Take 1 tablet (12.5 mg total) by mouth 2 (two) times daily with a meal. 60 tablet 2   empagliflozin (JARDIANCE) 25 MG TABS tablet Take by mouth daily. Once daily in the am     Fe Fum-Vit C-Vit B12-FA (TRIGELS-F FORTE) CAPS capsule Take 1 capsule by mouth 2 (two) times daily. 120 capsule 2   gabapentin (NEURONTIN) 300 MG capsule Take 300 mg by mouth 3 (three) times daily.     insulin  aspart (NOVOLOG) 100 UNIT/ML injection Inject into the skin 3 (three) times daily before meals.     isosorbide mononitrate (IMDUR) 60 MG 24 hr tablet Take 1 tablet (60 mg total) by mouth at bedtime. 30 tablet 1   LANTUS 100 UNIT/ML injection Inject 0.6 mLs (60 Units total) into the skin 2 (two) times daily. 10 mL 11   metFORMIN (GLUCOPHAGE) 1000 MG tablet Take 1,000 mg by mouth 2 (two) times daily with a meal.     Multiple Vitamins-Minerals (CENTRUM SILVER 50+MEN) TABS Take 1 tablet by mouth daily.     pantoprazole (PROTONIX) 40 MG tablet Take 40 mg by mouth daily.     polyethylene glycol (MIRALAX / GLYCOLAX) 17 g packet Take 17 g by mouth daily as needed for mild constipation. 14 each 0  tamsulosin (FLOMAX) 0.4 MG CAPS capsule Take 1 capsule (0.4 mg total) by mouth daily after supper. 30 capsule 2   torsemide (DEMADEX) 20 MG tablet Take 2 tablets (40 mg total) by mouth daily. 60 tablet 2   valsartan (DIOVAN) 160 MG tablet Take 160 mg by mouth daily.     BYETTA 10 MCG PEN 10 MCG/0.04ML SOPN injection Inject 10 mcg into the skin 2 (two) times daily with a meal. (Patient not taking: Reported on 04/12/2023)     OXYGEN Inhale 2 L into the lungs daily.     TRUEPLUS INSULIN SYRINGE 29G X 1/2" 1 ML MISC      TRUEPLUS PEN NEEDLES 31G X 8 MM MISC        Discharge Medications: Please see discharge summary for a list of discharge medications.  Relevant Imaging Results:  Relevant Lab Results:   Additional Information    Margarito Liner, LCSW

## 2023-04-18 NOTE — ED Provider Notes (Signed)
-----------------------------------------   6:19 AM on 04/18/2023 -----------------------------------------   Blood pressure 134/63, pulse 68, temperature 98 F (36.7 C), resp. rate 20, height 6\' 2"  (1.88 m), weight (!) 182 kg, SpO2 96%.  The patient is calm and cooperative at this time.  There have been no acute events since the last update.  Awaiting disposition plan from Social Work team.   Irean Hong, MD 04/18/23 [phone]

## 2023-04-18 NOTE — ED Notes (Signed)
 Pt given breakfast tray

## 2023-04-18 NOTE — ED Notes (Signed)
 Pt given meal tray.

## 2023-04-19 LAB — CBG MONITORING, ED
Glucose-Capillary: 92 mg/dL (ref 70–99)
Glucose-Capillary: 75 mg/dL (ref 70–99)
Glucose-Capillary: 138 mg/dL — ABNORMAL HIGH (ref 70–99)
Glucose-Capillary: 118 mg/dL — ABNORMAL HIGH (ref 70–99)
Glucose-Capillary: 129 mg/dL — ABNORMAL HIGH (ref 70–99)

## 2023-04-19 NOTE — ED Notes (Signed)
 Pt watching television but denies any needs at this time. Pt informed that his 10pm meds will be brought over soon and to expect a BG check for the insulin sliding scale. Pt ABCs intact. RR even and unlabored. Pt in NAD. Bed in lowest locked position. Call bell in reach. Denies needs at this time.

## 2023-04-19 NOTE — ED Notes (Signed)
 Patient male purewick and bed sheets changed. Peri-care performed by this RN and Connye Burkitt, RN.

## 2023-04-19 NOTE — ED Notes (Signed)
 Pt resting in bed, still connected to CPAP machine. Pt ABCs intact. RR even and unlabored. Pt in NAD. Bed in lowest locked position. Call bell in reach.

## 2023-04-19 NOTE — ED Notes (Signed)
 Pt eating dinner meal  ?

## 2023-04-19 NOTE — ED Notes (Signed)
 Patient attempted to use bedpan to have bowel movement x2- unsuccessful at this time. Bed pan removed by staff. Patient readjusted in bed.

## 2023-04-19 NOTE — ED Notes (Signed)
 Pt ABCs intact. RR even and unlabored. Pt in NAD. Bed in lowest locked position. Call bell in reach. Denies needs at this time.

## 2023-04-19 NOTE — ED Notes (Signed)
 During rounding of this pt, RN observed the pt to be peacefully sleeping in his bed. Pt ABCs intact. RR even and unlabored. Pt in NAD. Bed in lowest locked position. Call bell in reach.

## 2023-04-19 NOTE — TOC CM/SW Note (Signed)
 CSW met with patient at bedside and gave him bed offers to review for SNF's.  TOC to follow-up.

## 2023-04-19 NOTE — ED Provider Notes (Signed)
-----------------------------------------   5:52 AM on 04/19/2023 -----------------------------------------   Blood pressure (!) 110/46, pulse 72, temperature 97.7 F (36.5 C), temperature source Oral, resp. rate 20, height 6\' 2"  (1.88 m), weight (!) 182 kg, SpO2 (!) 1%.  The patient is calm and cooperative at this time.  There have been no acute events since the last update.  Awaiting disposition plan from Social Work team.   Irean Hong, MD 04/19/23 [phone]

## 2023-04-19 NOTE — ED Notes (Signed)
Pt assisted onto bedpan as requested.  

## 2023-04-19 NOTE — ED Notes (Addendum)
 RT called at this time after pt requested to be setup to CPAP machine. Machine already at the bedside, but this RN unsure of settings for pt. Pt RR even and unlabored while laying in bed. Pt continues to be on [place] and has normal respirations, no signs of labored breathing.

## 2023-04-19 NOTE — ED Notes (Signed)
Fsbs 118 

## 2023-04-19 NOTE — TOC Progression Note (Addendum)
 Transition of Care [facility]) - Progression Note    Patient Details  Name: Raymond [name] MRN: [id number] Date of Birth: [birth year]  [facility]) CM/SW Contact  Margarito Liner, LCSW Phone Number: 04/19/2023, 11:03 AM  Clinical Narrative:   CSW asked respiratory therapist for [facility], [place], and [place] to call the ED respiratory therapist to discuss bipap settings. If facility will allow patient to bring his home NIV, facility RT is willing to do an in-service for them. CSW asked facility liaison to notify CSW once RT's have discussed.  12:19 pm: [facility] confirmed they can offer a bed. Patient is aware and agreeable. Liaison is waiting to hear back regarding if patient can bring his home NIV or if they need to order a bipap before starting insurance authorization. They are also working on getting a bariatric bed.  Expected Discharge Plan and Services                                               Social Determinants of Health (SDOH) Interventions SDOH Screenings   Food Insecurity: No Food Insecurity (03/04/2023)  Housing: Low Risk  (03/04/2023)  Transportation Needs: No Transportation Needs (03/04/2023)  Utilities: Not At Risk (03/04/2023)  Tobacco Use: Low Risk  (04/12/2023)    Readmission Risk Interventions     No data to display

## 2023-04-19 NOTE — ED Notes (Signed)
 RN took over care of pt at this time. Per report, pt still awaiting acceptance to facility that will help with provided care for pt long term. Pt has been sitting on [place] @ 4 lpm for the day, only having labored breathing when PT came and worked with getting the pt out of bed. Pt was able to sit up in the bed and stand using walker. Pt just finishing with his dinner meal and currently talking on the phone. Pt denies any needs from the RN at this time. Pt ABCs intact. RR even and unlabored, still on [place] @ 4lpm. Pt in NAD. Bed in lowest locked position. Call bell in reach.   Past Medical History:  Diagnosis Date   AKI (acute kidney injury) (HCC) 04/28/2017   Arthritis    hands   Cellulitis    CHF (congestive heart failure) (HCC)    Chronic diastolic heart failure (HCC)    Chronic respiratory failure with hypoxia (HCC)    2 L [place] continuously   Diabetes mellitus type 2, insulin dependent (HCC)    Diabetic foot infection (HCC) 06/07/2022   Edema    FEET/LEGS   GERD (gastroesophageal reflux disease)    Hepatic steatosis    HOH (hard of hearing)    Hypertension    Morbid obesity (HCC)    Morbid obesity with BMI of 45.0-49.9, adult (HCC)    Neuropathy    Orthopnea    OSA on CPAP    TRILOGY VENTILATOR   Oxygen deficiency    2L  HS   PVD (peripheral vascular disease) (HCC)    Shortness of breath dyspnea    Slurred speech 03/03/2023   Systolic heart failure (HCC)    Preserved EF 50-55%

## 2023-04-19 NOTE — ED Notes (Signed)
 Pt connected to CPAP machine and is now resting comfortably in bed. Pt ABCs intact. RR even and unlabored. Pt in NAD. Bed in lowest locked position. Call bell in reach. Denies further needs at this time.

## 2023-04-19 NOTE — ED Notes (Signed)
 Resumed care from annie rn  pt alert  pt on 5 liters oxygen [place]  pt watching tv  no acute distress

## 2023-04-20 LAB — CBG MONITORING, ED
Glucose-Capillary: 124 mg/dL — ABNORMAL HIGH (ref 70–99)
Glucose-Capillary: 153 mg/dL — ABNORMAL HIGH (ref 70–99)
Glucose-Capillary: 116 mg/dL — ABNORMAL HIGH (ref 70–99)
Glucose-Capillary: 91 mg/dL (ref 70–99)

## 2023-04-20 NOTE — ED Notes (Signed)
 Resourse nurse informed of meds needs

## 2023-04-20 NOTE — ED Notes (Signed)
 Pt opted to refuse nightly dose of Lantus 30 units, pt reports BG being too low for him to comfortably taking it, even if snack was provided.

## 2023-04-20 NOTE — ED Provider Notes (Signed)
-----------------------------------------   5:32 AM on 04/20/2023 -----------------------------------------   Blood pressure 139/69, pulse 89, temperature 98.2 F (36.8 C), temperature source Oral, resp. rate 20, height 6\' 2"  (1.88 m), weight (!) 182 kg, SpO2 93%.  The patient is calm and cooperative at this time.  There have been no acute events since the last update.  Awaiting disposition plan from Social Work team.   Irean Hong, MD 04/20/23 [phone]

## 2023-04-20 NOTE — Progress Notes (Signed)
 PT Cancellation Note  Patient Details Name: Raymond [name] MRN: [id number] DOB: February 07, 1965   Cancelled Treatment:     PT attempt. Pt asleep upon arrival. He easily awakes but requested to use bedpan for BM. Bed linens soaked with urine due to faulty placed purewick. Pt on bed pan and will use call bell when finished. PT will return shortly to assist pt with getting OOB.     Rushie Chestnut 04/20/2023, 11:04 AM

## 2023-04-20 NOTE — ED Notes (Signed)
 Pt ABCs intact. RR even and unlabored. Pt in NAD. Bed in lowest locked position. Call bell in reach. Denies needs at this time.

## 2023-04-20 NOTE — ED Notes (Signed)
 Resumed care from [facility], pt resting quietly at this time, respirations even and unlabored, BiPAP on, will continue to monitor.

## 2023-04-20 NOTE — Progress Notes (Signed)
 Physical Therapy Treatment Patient Details Name: Raymond [name] MRN: [id number] DOB: Jan 21, 1966 Today's Date: 04/20/2023   History of Present Illness Patient is a 58 year old male with multiple admissions in recent months for CVA, STEMI, heart cath, acute on chronic hypoxic respiratory failure. History of morbid obesity, chronic  HFpEF, HTN, OSA on CPAP, CAD, T2DM        If plan is discharge home, recommend the following: A lot of help with walking and/or transfers;A lot of help with bathing/dressing/bathroom;Assistance with cooking/housework;Assist for transportation;Help with stairs or ramp for entrance     Equipment Recommendations  None recommended by PT       Precautions / Restrictions Precautions Precautions: Fall Restrictions Weight Bearing Restrictions Per Provider Order: No     Mobility  Bed Mobility Overal bed mobility: Needs Assistance Bed Mobility: Supine to Sit, Sit to Supine  Supine to sit: Max assist, Used rails Sit to supine: Min assist General bed mobility comments: pt sleeps in lift chair at bedside. +1 max assist to exit bed however later in session only min assist progressing BLEs back into bed  to return to supine .    Transfers Overall transfer level: Needs assistance Equipment used: Rolling walker (2 wheels) Transfers: Sit to/from Stand Sit to Stand: Min assist, From elevated surface  General transfer comment: pt stood EOB 3 x prior to ambulating only small distance. Pt does require bed height to be elevated however min assist only. Bariatric RW used. pt used rocking method to achieve standing.    Ambulation/Gait Ambulation/Gait assistance: Contact guard assist Gait Distance (Feet): 8 Feet Assistive device: Rolling walker (2 wheels) (Bariatric) Gait Pattern/deviations: Step-through pattern Gait velocity: decreased  General Gait Details: pt was able to ambulate 8 ft with CGA only. distance limited by pt feeling urge to have BM. no  bariatric BSC in room. Returned to bed.   Balance Overall balance assessment: Needs assistance Sitting-balance support: No upper extremity supported Sitting balance-Leahy Scale: Fair     Standing balance support: Bilateral upper extremity supported, During functional activity, Reliant on assistive device for balance Standing balance-Leahy Scale: Fair       Hotel manager: No apparent difficulties  Cognition Arousal: Alert Behavior During Therapy: Flat affect   PT - Cognitive impairments: No apparent impairments    PT - Cognition Comments: Pt is A and O x 3. Pt requires extensive assistance/support from girlfriend prior to admission. Following commands: Intact      Cueing Cueing Techniques: Verbal cues     General Comments General comments (skin integrity, edema, etc.): pt was on 4 L o2 throughout session with sao2 >92%      Pertinent Vitals/Pain Pain Assessment Pain Assessment: No/denies pain     PT Goals (current goals can now be found in the care plan section) Acute Rehab PT Goals Patient Stated Goal: rehab then home Progress towards PT goals: Progressing toward goals    Frequency    Min 2X/week       Co-evaluation     PT goals addressed during session: Mobility/safety with mobility;Balance;Proper use of DME;Strengthening/ROM        AM-PAC PT "6 Clicks" Mobility   Outcome Measure  Help needed turning from your back to your side while in a flat bed without using bedrails?: A Little Help needed moving from lying on your back to sitting on the side of a flat bed without using bedrails?: A Lot Help needed moving to and from a bed to a chair (including  a wheelchair)?: A Lot Help needed standing up from a chair using your arms (e.g., wheelchair or bedside chair)?: A Lot Help needed to walk in hospital room?: A Little Help needed climbing 3-5 steps with a railing? : Total 6 Click Score: 13    End of Session Equipment Utilized  During Treatment: Oxygen Activity Tolerance: Patient tolerated treatment well Patient left: in bed;with call bell/phone within reach;with bed alarm set;with nursing/sitter in room Nurse Communication: Mobility status PT Visit Diagnosis: Muscle weakness (generalized) (M62.81);Difficulty in walking, not elsewhere classified (R26.2)     Time: 6301-6010 PT Time Calculation (min) (ACUTE ONLY): 18 min  Charges:    $Therapeutic Activity: 8-22 mins PT General Charges $$ ACUTE PT VISIT: 1 Visit                     Jetta Lout PTA 04/20/23, 4:49 PM

## 2023-04-20 NOTE — ED Notes (Signed)
 Breakfast tray given.

## 2023-04-20 NOTE — ED Notes (Signed)
 RN took over care of pt at this time. Per report, pt still awaiting acceptance to facility that will help with provided care for pt long term. Pt has been sitting on [place] @ 4 lpm. No signs of labored breathing. Per dayshift RN, pt was changed several times, provided with new sheets and male primo fit device for suctioning of urine. Pt denies any needs from the RN at this time. Pt ABCs intact. RR even and unlabored, still on [place] @ 4lpm. Pt in NAD. Bed in lowest locked position. Call bell in reach.   Past Medical History:  Diagnosis Date   AKI (acute kidney injury) (HCC) 04/28/2017   Arthritis    hands   Cellulitis    CHF (congestive heart failure) (HCC)    Chronic diastolic heart failure (HCC)    Chronic respiratory failure with hypoxia (HCC)    2 L [place] continuously   Diabetes mellitus type 2, insulin dependent (HCC)    Diabetic foot infection (HCC) 06/07/2022   Edema    FEET/LEGS   GERD (gastroesophageal reflux disease)    Hepatic steatosis    HOH (hard of hearing)    Hypertension    Morbid obesity (HCC)    Morbid obesity with BMI of 45.0-49.9, adult (HCC)    Neuropathy    Orthopnea    OSA on CPAP    TRILOGY VENTILATOR   Oxygen deficiency    2L  HS   PVD (peripheral vascular disease) (HCC)    Shortness of breath dyspnea    Slurred speech 03/03/2023   Systolic heart failure (HCC)    Preserved EF 50-55%

## 2023-04-20 NOTE — TOC Progression Note (Addendum)
 Transition of Care [facility]) - Progression Note    Patient Details  Name: Raymond [name] MRN: [id number] Date of Birth: [birth year]  [facility]) CM/SW Contact  Margarito Liner, LCSW Phone Number: 04/20/2023, 9:26 AM  Clinical Narrative:  [facility] needs patient's bipap FiO2 to be below 35% and for patient to be stable on that. RT is aware and will work on this tonight. SNF liaison is still working with facility to determine if they'll accept patient on home NIV or if they will order a bipap. They will hold off on starting insurance authorization until decision is finalized.  11:38 am: CSW updated patient at bedside.  Expected Discharge Plan and Services                                               Social Determinants of Health (SDOH) Interventions SDOH Screenings   Food Insecurity: No Food Insecurity (03/04/2023)  Housing: Low Risk  (03/04/2023)  Transportation Needs: No Transportation Needs (03/04/2023)  Utilities: Not At Risk (03/04/2023)  Tobacco Use: Low Risk  (04/12/2023)    Readmission Risk Interventions     No data to display

## 2023-04-21 LAB — CBG MONITORING, ED
Glucose-Capillary: 120 mg/dL — ABNORMAL HIGH (ref 70–99)
Glucose-Capillary: 193 mg/dL — ABNORMAL HIGH (ref 70–99)
Glucose-Capillary: 164 mg/dL — ABNORMAL HIGH (ref 70–99)
Glucose-Capillary: 157 mg/dL — ABNORMAL HIGH (ref 70–99)

## 2023-04-21 NOTE — ED Notes (Addendum)
 Assumed care of this pt and upon initial round pt was found to be sleeping with BiPap in place. Even rise and fall of chest noted. Pt's bed is in the lowest/locked position with call bell in reach. NAD noted at this time.

## 2023-04-21 NOTE — Progress Notes (Signed)
 Physical Therapy Treatment Patient Details Name: Raymond [name] MRN: [id number] DOB: 1965/02/15 Today's Date: 04/21/2023   History of Present Illness Patient is a 58 year old male with multiple admissions in recent months for CVA, STEMI, heart cath, acute on chronic hypoxic respiratory failure. History of morbid obesity, chronic  HFpEF, HTN, OSA on CPAP, CAD, T2DM    PT Comments  Pt was just finishing being cleaned up by RN staff upon arrival. ON bi-pap but switched to 4 L O2  prior to OOB activity. Pt's sao2 > 92% on 4 L. Pt sleep in lift chair at baseline. Required increased time + Mod A + heavy use of bed rail to achieve EOB sitting. Sat EOB for awhile due to c/o lightheadedness however vitals remain stable. He stood with min assist from elevated bed height but mod assist from lower. Due to pt's size, he uses momentum to achieve standing. Heavy reliance on bariatric RW during all standing activity. Pt marched in place, alternating x 20 prior to ambulating ~ 5 ft to recliner. Breakfast tray arrived. He was repositioned in recliner at conclusion of session with call bell in reach. Will return later this date to assist pt with returning to bed. Dc recs remain appropriate however pt did endorse considering Dcing start home with [facility] services. Pt is not a community ambulator at baseline and per pt, he rely's on his girlfriend for a lot of ADLs. Acute PT will continue to follow and progress per current POC.      If plan is discharge home, recommend the following: A lot of help with walking and/or transfers;A lot of help with bathing/dressing/bathroom;Assistance with cooking/housework;Assist for transportation;Help with stairs or ramp for entrance     Equipment Recommendations  None recommended by PT       Precautions / Restrictions Precautions Precautions: Fall Recall of Precautions/Restrictions: Intact Restrictions Weight Bearing Restrictions Per Provider Order: No     Mobility   Bed Mobility Overal bed mobility: Needs Assistance Bed Mobility: Supine to Sit  Supine to sit: Mod assist, Used rails  General bed mobility comments: pt sleeps in lift chair at baseline but was able to achieve EOB short sitting with mod assist of one + heavy reliance on bed rail. Pt on 4 L o2. sao2 >92% throughout however pt required prolonged sitting at EOB for lightheadedness to improve." Its because I havent been sitting up very much lately."    Transfers Overall transfer level: Needs assistance Equipment used: Rolling walker (2 wheels) Transfers: Sit to/from Stand Sit to Stand: Min assist, Mod assist, From elevated surface  General transfer comment: Min  A from elevated, mod assist from lower.    Ambulation/Gait Ambulation/Gait assistance: Contact guard assist Gait Distance (Feet): 5 Feet Assistive device: Rolling walker (2 wheels) (Bariatric RW) Gait Pattern/deviations: Step-through pattern, Trunk flexed Gait velocity: decreased  General Gait Details: heavy reliance on BUE support on RW however able to ambulate short distances fair. No desaturation with standing activity but pt does fatigue quickly. Pt is not a Tourist information centre manager at baseline   Balance Overall balance assessment: Needs assistance Sitting-balance support: No upper extremity supported Sitting balance-Leahy Scale: Fair     Standing balance support: Bilateral upper extremity supported, During functional activity, Reliant on assistive device for balance Standing balance-Leahy Scale: Fair    Hotel manager: No apparent difficulties  Cognition Arousal: Alert Behavior During Therapy: Flat affect   PT - Cognitive impairments: No apparent impairments   PT - Cognition Comments: Pt is  A and O  but flat. increased time to response. Slow processing but pt does seem to have good insight of his deficits Following commands: Intact      Cueing Cueing Techniques: Verbal cues     General  Comments General comments (skin integrity, edema, etc.): pt was on bipap but switched to 4L Halfway prior to session      Pertinent Vitals/Pain Pain Assessment Pain Assessment: No/denies pain     PT Goals (current goals can now be found in the care plan section) Acute Rehab PT Goals Patient Stated Goal: "Get my butt wound better" Progress towards PT goals: Progressing toward goals    Frequency    Min 2X/week           Co-evaluation     PT goals addressed during session: Mobility/safety with mobility;Balance;Proper use of DME;Strengthening/ROM        AM-PAC PT "6 Clicks" Mobility   Outcome Measure  Help needed turning from your back to your side while in a flat bed without using bedrails?: A Little Help needed moving from lying on your back to sitting on the side of a flat bed without using bedrails?: A Lot Help needed moving to and from a bed to a chair (including a wheelchair)?: A Lot Help needed standing up from a chair using your arms (e.g., wheelchair or bedside chair)?: A Lot Help needed to walk in hospital room?: A Little Help needed climbing 3-5 steps with a railing? : Total 6 Click Score: 13    End of Session Equipment Utilized During Treatment: Oxygen (4L o2) Activity Tolerance: Patient tolerated treatment well;Patient limited by fatigue Patient left: in chair;with call bell/phone within reach;with chair alarm set Nurse Communication: Mobility status PT Visit Diagnosis: Muscle weakness (generalized) (M62.81);Difficulty in walking, not elsewhere classified (R26.2)     Time: 5621-3086 PT Time Calculation (min) (ACUTE ONLY): 25 min  Charges:    $Therapeutic Activity: 23-37 mins PT General Charges $$ ACUTE PT VISIT: 1 Visit                     Jetta Lout PTA 04/21/23, 9:15 AM

## 2023-04-21 NOTE — ED Notes (Signed)
 First RN asked if pt could have 2 visitors. Pt visitors on the way back.

## 2023-04-21 NOTE — ED Notes (Signed)
 Pt ABCs intact. RR even and unlabored on CPAP. Pt in NAD. Bed in lowest locked position. Call bell in reach.

## 2023-04-21 NOTE — ED Notes (Signed)
 Pt ABCs intact. RR even and unlabored. Pt in NAD. Bed in lowest locked position. Call bell in reach.

## 2023-04-21 NOTE — ED Provider Notes (Signed)
-----------------------------------------   7:14 AM on 04/21/2023 -----------------------------------------   Blood pressure (!) 134/53, pulse 61, temperature 99.7 F (37.6 C), temperature source Axillary, resp. rate 20, height 6\' 2"  (1.88 m), weight (!) 182 kg, SpO2 98%.  The patient is calm and cooperative at this time.  There have been no acute events since the last update.  Awaiting disposition plan from case management/social work.    Ethelbert Thain, Layla Maw, DO 04/21/23 [phone]

## 2023-04-21 NOTE — ED Notes (Signed)
 RT called at this time to assist with setting up pts CPAP.

## 2023-04-21 NOTE — ED Notes (Signed)
 Pt requesting bed pan. Pt given call bell and told to call out when complete.

## 2023-04-21 NOTE — ED Notes (Signed)
 Pt observed resting in bed at this time comfortably with CPAP machine still connected. Pt ABCs intact. RR even and unlabored. Pt in NAD. Bed in lowest locked position.

## 2023-04-21 NOTE — ED Notes (Signed)
 Pt requested blanket to cover legs and was provided with one. RN noted that the pts urine cannister was also getting pretty full and it was emptied.

## 2023-04-21 NOTE — TOC CM/SW Note (Signed)
 Spoke with Raymond [name], at [facility] [phone]() to discuss FiO2 percentage.  She said that he needs to remain on the Bipap over the weekend and that his readings should remain below 34. They will re-evaluate him on Monday.  Information relayed to [place], Charity fundraiser.

## 2023-04-21 NOTE — ED Notes (Signed)
Pt resting peacefully in bed

## 2023-04-21 NOTE — Progress Notes (Signed)
 OT Cancellation Note  Patient Details Name: Raymond [name] MRN: [id number] DOB: May 22, 1965   Cancelled Treatment:    Reason Eval/Treat Not Completed: Fatigue/lethargy limiting ability to participate. OT attempt - pt just getting back into bed after being up in recliner. Requests to rest at this time. OT will re-attempt as able.  Altin Sease L. Cynethia Schindler, OTR/L  04/21/23, 2:50 PM

## 2023-04-21 NOTE — ED Notes (Signed)
 Pt requested that their CBG be checked because the monitor they wear on their arm was reading high.

## 2023-04-21 NOTE — ED Notes (Signed)
 Pt ABCs intact. RR even and unlabored. Pt in NAD. Bed in lowest locked position. Call bell in reach. Denies needs at this time.

## 2023-04-21 NOTE — ED Notes (Signed)
 Pt linen and gown was checked for any incontinence episode. Gown an d linen was clean and dry. Pt was also tilted with pillow towards left side. Pt tolerated it well. Pt has no voiced needs.

## 2023-04-21 NOTE — ED Notes (Signed)
 Pt is alert and oriented x4. Pt was checked for incontinence of urine and bowel which pt was cleaned and dry. Pure wick was operable and in place. Pt was given water and graham crackers for nutrition. Pt has no voiced needs at this time.

## 2023-04-21 NOTE — ED Notes (Signed)
 Pt was found to be incontinent of urine and their bed was soaked with urine. With the help of RN Annabelle Harman, pt's bed linens, and chuck pad were changed and pericare was performed. Pt's bed was placed back in the lowest/locked position with the call bell in reach. PT came to get pt into the recliner when this RN and RN Annabelle Harman had wrapped up cleaning up the pt. PT said to let them know when pt was ready to get back into the bed and they would come and help.

## 2023-04-22 LAB — CBG MONITORING, ED
Glucose-Capillary: 150 mg/dL — ABNORMAL HIGH (ref 70–99)
Glucose-Capillary: 181 mg/dL — ABNORMAL HIGH (ref 70–99)
Glucose-Capillary: 135 mg/dL — ABNORMAL HIGH (ref 70–99)
Glucose-Capillary: 124 mg/dL — ABNORMAL HIGH (ref 70–99)

## 2023-04-22 NOTE — ED Notes (Signed)
 Checked pt to see if he needed to be changed. No BM present. Pt dry at this time.

## 2023-04-22 NOTE — ED Notes (Signed)
 Pt voiced no concerns at this time.

## 2023-04-22 NOTE — ED Provider Notes (Signed)
 Emergency Medicine Observation Re-evaluation Note  Raymond [name] is a 58 y.o. male, seen on rounds today.  Pt initially presented to the ED for complaints of Weakness  Currently, the patient is calm, no acute complaints.  Physical Exam  Blood pressure (!) 128/43, pulse 65, temperature 99.8 F (37.7 C), temperature source Oral, resp. rate 20, height 6\' 2"  (1.88 m), weight (!) 182 kg, SpO2 94%. Physical Exam General: NAD Lungs: CTAB Psych: not agitated  ED Course / MDM  EKG:    I have reviewed the labs performed to date as well as medications administered while in observation.  Recent changes in the last 24 hours include no acute events overnight.    Plan  Current plan is for TOC placement.   Sharman Cheek, MD 04/22/23 [phone]

## 2023-04-23 LAB — CBG MONITORING, ED
Glucose-Capillary: 158 mg/dL — ABNORMAL HIGH (ref 70–99)
Glucose-Capillary: 190 mg/dL — ABNORMAL HIGH (ref 70–99)
Glucose-Capillary: 134 mg/dL — ABNORMAL HIGH (ref 70–99)
Glucose-Capillary: 129 mg/dL — ABNORMAL HIGH (ref 70–99)

## 2023-04-23 NOTE — ED Notes (Signed)
 This RN and Leisure centre manager provided a full bed bath, full linen change and sacral pad replaced. New gown applied, Deoderant applied and fresh warm blankets given. Pt tolerated well. Bed locked and in lowest position, CB within reach, NAD.

## 2023-04-23 NOTE — ED Provider Notes (Signed)
 Emergency Medicine Observation Re-evaluation Note  Raymond [name] is a 58 y.o. male, seen on rounds today.  Pt initially presented to the ED for complaints of Weakness Currently, the patient is resting comfortably.  Physical Exam  BP (!) 156/54   Pulse 67   Temp 98.1 F (36.7 C) (Oral)   Resp 18   Ht 6\' 2"  (1.88 m)   Wt (!) 182 kg   SpO2 92%   BMI 51.52 kg/m  Physical Exam General: No acute distress  ED Course / MDM  EKG:EKG Interpretation Date/Time:  Wednesday April 12 2023 15:56:40 EDT Ventricular Rate:  74 PR Interval:  137 QRS Duration:  110 QT Interval:  420 QTC Calculation: 466 R Axis:   81  Text Interpretation: Sinus rhythm Atrial premature complex Low voltage, precordial leads RSR' in V1 or V2, right VCD or RVH Borderline T abnormalities, anterior leads Confirmed by UNCONFIRMED, DOCTOR ([id number]), editor Lonell Face [phone]) on 04/13/2023 7:10:24 AM  I have reviewed the labs performed to date as well as medications administered while in observation.  Recent changes in the last 24 hours include no acute events.  Plan  Current plan is for placement.    Janith Lima, MD 04/23/23 [phone]

## 2023-04-23 NOTE — ED Notes (Signed)
 This RN checked pt for stool or leakage. Pt was clean and dry. No further needs expressed. CB within reach. NAD

## 2023-04-23 NOTE — ED Notes (Signed)
 Pt resting, eyes closed, respirations even non-labored.

## 2023-04-24 LAB — CBG MONITORING, ED
Glucose-Capillary: 96 mg/dL (ref 70–99)
Glucose-Capillary: 121 mg/dL — ABNORMAL HIGH (ref 70–99)
Glucose-Capillary: 119 mg/dL — ABNORMAL HIGH (ref 70–99)

## 2023-04-24 NOTE — ED Notes (Signed)
 Pt had a large bowel movement in bed. Pt was cleaned up by this RN and EDT Mayra. Pericare was performed and new chuck pads were applied. Pt bed is in the lowest/locked position with call bell in reach. Pt tolerated activity well.

## 2023-04-24 NOTE — ED Notes (Signed)
 Pt ABCs intact. RR even and unlabored. Pt in NAD. Bed in lowest locked position. Call bell in reach. Denies needs at this time.

## 2023-04-24 NOTE — ED Notes (Signed)
 Pt resting comfortably with visible chest rise and fall

## 2023-04-24 NOTE — ED Notes (Signed)
 Pt is resting comfortably with visible chest rise and fall

## 2023-04-24 NOTE — Progress Notes (Signed)
 PT Cancellation Note  Patient Details Name: Raymond [name] MRN: [id number] DOB: [birth year]   Cancelled Treatment:    Reason Eval/Treat Not Completed: Other (comment).  Chart reviewed and attempted to see.  Pt currently voiding in the bed, nursing notified.  Therapist offered to assist, however pt is not finished having bowel movement.  Will re-attempt at later date/time as medically appropriate.    Nolon Bussing, PT, DPT Physical Therapist - [facility]  04/24/23, 2:24 PM

## 2023-04-24 NOTE — ED Provider Notes (Signed)
-----------------------------------------   5:19 AM on 04/24/2023 -----------------------------------------   Blood pressure 121/61, pulse 67, temperature 98.7 F (37.1 C), temperature source Oral, resp. rate 18, height 6\' 2"  (1.88 m), weight (!) 182 kg, SpO2 95%.  The patient is calm and cooperative at this time.  There have been no acute events since the last update.  Awaiting disposition plan from Social Work team.   Irean Hong, MD 04/24/23 [phone]

## 2023-04-24 NOTE — TOC Progression Note (Signed)
 Transition of Care [facility]) - Progression Note    Patient Details  Name: Raymond [name] MRN: [id number] Date of Birth: December 10, 1965  Transition of [facility] CM/SW Contact  Margarito Liner, LCSW Phone Number: 04/24/2023, 10:48 AM  Clinical Narrative:  [facility] liaison asked their nurse consultant to speak with facility DON regarding NIV/bipap.   Expected Discharge Plan and Services                                               Social Determinants of Health (SDOH) Interventions SDOH Screenings   Food Insecurity: No Food Insecurity (03/04/2023)  Housing: Low Risk  (03/04/2023)  Transportation Needs: No Transportation Needs (03/04/2023)  Utilities: Not At Risk (03/04/2023)  Tobacco Use: Low Risk  (04/12/2023)    Readmission Risk Interventions     No data to display

## 2023-04-24 NOTE — ED Notes (Addendum)
 Pt ABCs intact. RR even and unlabored on 4 lpm via [place]. Pt in NAD. Bed in lowest locked position. Call bell in reach. Pt small pitcher filled with water and ice as requested.

## 2023-04-25 LAB — CBG MONITORING, ED
Glucose-Capillary: 177 mg/dL — ABNORMAL HIGH (ref 70–99)
Glucose-Capillary: 230 mg/dL — ABNORMAL HIGH (ref 70–99)
Glucose-Capillary: 178 mg/dL — ABNORMAL HIGH (ref 70–99)
Glucose-Capillary: 151 mg/dL — ABNORMAL HIGH (ref 70–99)

## 2023-04-25 MED ORDER — INSULIN GLARGINE-YFGN 100 UNIT/ML ~~LOC~~ SOLN
30.0000 [IU] | Freq: Two times a day (BID) | SUBCUTANEOUS | Status: DC
  Administered 2023-04-25 – 2023-04-28 (×6): 30 [IU] via SUBCUTANEOUS
  Filled 2023-04-25 (×7): qty 0.3

## 2023-04-25 NOTE — ED Notes (Signed)
 Pt sleeping peacefully in bed. Pt ABCs intact. RR even and unlabored. Pt in NAD. Bed in lowest locked position. Call bell in reach.

## 2023-04-25 NOTE — Progress Notes (Signed)
 Occupational Therapy Treatment Patient Details Name: Raymond [name] MRN: [id number] DOB: [birth year] Today's Date: 04/25/2023   History of present illness Patient is a 58 year old male with multiple admissions in recent months for CVA, STEMI, heart cath, acute on chronic hypoxic respiratory failure. History of morbid obesity, chronic  HFpEF, HTN, OSA on CPAP, CAD, T2DM   OT comments  Pt seen today for OT tx session. Performs bed mobility with MOD A, mild dyspnea upon exertion with O2 sats on 4L/min 99% (baseline per pt). Performs BADLs sitting EOB with SBA for dynamic seated balance, setup for UB bathing. Requires max A to apply deodorant due to LUE deficits from prior CVA. Educated pt on importance of positional changes to prevent pressure sores, UE/LE therapeutic exercises to improve mobility and strength. Pt verbalizes understanding. Returns to supine with MOD A for BLE. Left with needs in reach. Discharge recommendation appropriate, OT will continue to follow. Recommending +2 for mobility attempts for pt and therapist safety.       If plan is discharge home, recommend the following:  Two people to help with walking and/or transfers;Two people to help with bathing/dressing/bathroom;Assist for transportation;Assistance with cooking/housework;Help with stairs or ramp for entrance   Equipment Recommendations  Other (comment)       Precautions / Restrictions Precautions Precautions: Fall Recall of Precautions/Restrictions: Intact Restrictions Weight Bearing Restrictions Per Provider Order: No       Mobility Bed Mobility Overal bed mobility: Needs Assistance Bed Mobility: Sit to Supine     Supine to sit: Mod assist, Used rails Sit to supine: Mod assist   General bed mobility comments: mod assist to progress BLEs into bed    Transfers Overall transfer level: Needs assistance   Transfers: Bed to chair/wheelchair/BSC            Lateral/Scoot Transfers: Mod  assist General transfer comment: lateral scoot sitting EOB x 3     Balance Overall balance assessment: Needs assistance Sitting-balance support: No upper extremity supported Sitting balance-Leahy Scale: Fair Sitting balance - Comments: Performed BADLs seated EOB, with steady sitting reaching within BOS, ~15 mins                                   ADL either performed or assessed with clinical judgement   ADL Overall ADL's : Needs assistance/impaired     Grooming: Wash/dry face;Set up;Sitting;Maximal assistance;Applying deodorant Grooming Details (indicate cue type and reason): EOB for OT to wash back while pt washed face, max A for deodorant application due to body habitus and L sided deficits from prior CVA Upper Body Bathing: Set up;Sitting Upper Body Bathing Details (indicate cue type and reason): sitting EOB     Upper Body Dressing : Minimal assistance;Sitting Upper Body Dressing Details (indicate cue type and reason): gown change sitting EOB Lower Body Dressing: Maximal assistance Lower Body Dressing Details (indicate cue type and reason): maxA to don socks seated EOB               General ADL Comments: Seated UB sponge bath    Extremity/Trunk Assessment Upper Extremity Assessment Upper Extremity Assessment: LUE deficits/detail LUE Deficits / Details: hx of CVA; L-sided deficits, unable to perform full tricep extension with shld elevated at 80 deg. 0-80 deg ROM L shoulder flex                     Communication Communication Communication: No  apparent difficulties   Cognition Arousal: Alert Behavior During Therapy: Flat affect                                 Following commands: Intact        Cueing   Cueing Techniques: Verbal cues  Exercises Exercises: General Upper Extremity, General Lower Extremity General Exercises - Upper Extremity Shoulder Flexion: Both, 5 reps, Seated, AROM General Exercises - Lower Extremity Quad  Sets: 5 reps, Both, Strengthening, Supine Short Arc Quad: Both, Seated, 5 reps, Strengthening Other Exercises Other Exercises: discussed UE/LE exercises to perform in bed with pt to assist with mobility and L-sided deficits from CVA.            Pertinent Vitals/ Pain       Pain Assessment Pain Assessment: No/denies pain   Frequency  Min 2X/week        Progress Toward Goals  OT Goals(current goals can now be found in the care plan section)  Progress towards OT goals: Progressing toward goals  Acute Rehab OT Goals OT Goal Formulation: With patient Time For Goal Achievement: 05/01/23 Potential to Achieve Goals: Good ADL Goals Pt Will Perform Grooming: sitting;with supervision Pt Will Perform Upper Body Bathing: sitting;with supervision Pt Will Perform Lower Body Dressing: with mod assist;with adaptive equipment;sitting/lateral leans;sit to/from stand Pt Will Transfer to Toilet: with transfer board;bedside commode;with mod assist;stand pivot transfer Pt Will Perform Toileting - Clothing Manipulation and hygiene: sitting/lateral leans;with mod assist  Plan         AM-PAC OT "6 Clicks" Daily Activity     Outcome Measure   Help from another person eating meals?: None Help from another person taking care of personal grooming?: A Little Help from another person toileting, which includes using toliet, bedpan, or urinal?: Total Help from another person bathing (including washing, rinsing, drying)?: A Lot Help from another person to put on and taking off regular upper body clothing?: A Little Help from another person to put on and taking off regular lower body clothing?: A Lot 6 Click Score: 15    End of Session Equipment Utilized During Treatment: Oxygen (4L baseline O2)  OT Visit Diagnosis: Muscle weakness (generalized) (M62.81);Other abnormalities of gait and mobility (R26.89)   Activity Tolerance Patient tolerated treatment well   Patient Left in bed;with call  bell/phone within reach   Nurse Communication Mobility status        Time: 2956-2130 OT Time Calculation (min): 29 min  Charges: OT General Charges $OT Visit: 1 Visit OT Treatments $Self Care/Home Management : 23-37 mins  Arjun Hard L. Adalaide Jaskolski, OTR/L  04/25/23, 4:41 PM

## 2023-04-25 NOTE — ED Notes (Signed)
 Pt bed alarm going off. This RN reset alarm. Pt undisturbed from sleep. Pt ABCs intact. RR even and unlabored. Pt in NAD.

## 2023-04-25 NOTE — TOC CM/SW Note (Signed)
 CSW spoke with Raymond [name] at [facility] [phone].  She is still trying to work with her leadership team to see if they will take this patient.  TOC to continue to follow for update.

## 2023-04-25 NOTE — ED Notes (Signed)
 Pt sleeping peacefully on CPAP machine. Pt ABCs intact. RR even and unlabored. Pt in NAD. Bed in lowest locked position. Call bell in reach.

## 2023-04-25 NOTE — ED Notes (Signed)
OT at patient bedside

## 2023-04-26 LAB — CBG MONITORING, ED
Glucose-Capillary: 156 mg/dL — ABNORMAL HIGH (ref 70–99)
Glucose-Capillary: 187 mg/dL — ABNORMAL HIGH (ref 70–99)
Glucose-Capillary: 174 mg/dL — ABNORMAL HIGH (ref 70–99)

## 2023-04-26 NOTE — ED Notes (Addendum)
 Patients Partner that is at bedside requested that RN ask Social worker to call her; Darl Householder Event organiser): [phone] to update her on [facility] placement. RN described that social work was waiting on [facility]. RN reached out to Social Work at [phone] and Patients partner stated to RN that she is still waiting for a call.

## 2023-04-26 NOTE — ED Notes (Signed)
 Notified Partner at bedside that social worker would call later and that their was still no response from [facility].

## 2023-04-26 NOTE — ED Notes (Signed)
 This RN received report from Clayborn Bigness RN and performed care handoff. This RN introduced self to pt. Call light in reach, bed wheels locked, side rail raised, pt updated on plan of care. Rounding completed.

## 2023-04-26 NOTE — ED Notes (Signed)
 Reparatory at bedside placing patients CPAP on for bedtime.

## 2023-04-26 NOTE — Progress Notes (Signed)
 PT Cancellation Note  Patient Details Name: Raymond [name] MRN: [id number] DOB: [birth year]   Cancelled Treatment:     PT attempt. Pt refused. He was asleep upon arrival but does easily awake. He endorses last time getting OOB was yesterday however unwilling to perform OOB at this time. Max encouragement however pt remains unwilling. " I just don't feel like it. I'll do it tomorrow." Acute PT will continue to follow and progress per current POC.    Rushie Chestnut 04/26/2023, 11:07 AM

## 2023-04-26 NOTE — ED Notes (Signed)
 Patient requesting CPAP machine be turned on to go to sleep. Called respiratory. Respiratory stated they were in ED already and patient did not want it placed on at that time. They stated they would be down to place it on patient when available.

## 2023-04-26 NOTE — ED Notes (Addendum)
 Patient stating he wanted to know how long it wound take to get placement. RN informed patient that soical work was working on Education officer, museum at [employer]. Patient stated if it took longer to get placement that he wanted to consider going home. Patient is currently eating dinner and has family at bedside.

## 2023-04-26 NOTE — ED Notes (Signed)
 Physical Therapy attempted to work with the patient this morning. Patient refused. PT communicated with RN.

## 2023-04-26 NOTE — ED Notes (Addendum)
 RN asked patient if there was anything RN could do to make him more comfortable. Patient responded he was comfortable right now and did not need anything.

## 2023-04-27 LAB — CBG MONITORING, ED
Glucose-Capillary: 146 mg/dL — ABNORMAL HIGH (ref 70–99)
Glucose-Capillary: 180 mg/dL — ABNORMAL HIGH (ref 70–99)
Glucose-Capillary: 186 mg/dL — ABNORMAL HIGH (ref 70–99)
Glucose-Capillary: 207 mg/dL — ABNORMAL HIGH (ref 70–99)
Glucose-Capillary: 213 mg/dL — ABNORMAL HIGH (ref 70–99)

## 2023-04-27 NOTE — TOC Progression Note (Signed)
 Transition of Care [facility]) - Progression Note    Patient Details  Name: Raymond [name] MRN: [id number] Date of Birth: 07-08-1965  Transition of Care [facility]) CM/SW Contact  Colin Broach, LCSW Phone Number: 04/27/2023, 12:14 PM  Clinical Narrative:     Additional clinicals sent out to facilities.  No response from [facility].  TOC will continue to follow. If there are no bed offers, CSW will discuss with MD about patient discharging home with home health.         Expected Discharge Plan and Services                                               Social Determinants of Health (SDOH) Interventions SDOH Screenings   Food Insecurity: No Food Insecurity (03/04/2023)  Housing: Low Risk  (03/04/2023)  Transportation Needs: No Transportation Needs (03/04/2023)  Utilities: Not At Risk (03/04/2023)  Tobacco Use: Low Risk  (04/12/2023)    Readmission Risk Interventions     No data to display

## 2023-04-27 NOTE — ED Notes (Signed)
 Pt refused 8am meds and asked to take them later due to pt wanting to sleep.

## 2023-04-27 NOTE — ED Notes (Signed)
 Pt sitting in chair eating breakfast. CB within reach. NAD

## 2023-04-27 NOTE — ED Notes (Signed)
 PT at bedside.

## 2023-04-27 NOTE — ED Provider Notes (Signed)
-----------------------------------------   6:53 AM on 04/27/2023 -----------------------------------------   Blood pressure (!) 107/44, pulse 69, temperature 98.9 F (37.2 C), temperature source Oral, resp. rate 18, height 1.88 m (6\' 2" ), weight (!) 182 kg, SpO2 91%.  The patient is calm and cooperative at this time.  There have been no acute events since the last update.  Awaiting disposition plan from [facility] team.   Loleta Rose, MD 04/27/23 [phone]

## 2023-04-27 NOTE — Progress Notes (Addendum)
 Physical Therapy Treatment Patient Details Name: Raymond [name] MRN: [id number] DOB: 09/03/1965 Today's Date: 04/27/2023   History of Present Illness Patient is a 58 year old male with multiple admissions in recent months for CVA, STEMI, heart cath, acute on chronic hypoxic respiratory failure. History of morbid obesity, chronic  HFpEF, HTN, OSA on CPAP, CAD, T2DM    PT Comments  Pt was long sitting in bed with RN present upon arrival. He is alert but continues to present with flat affect. Pt remains on o2 throughout session. Lengthy discussion about needing to move more while in bed and while OOB to prevent further deconditioning/weakness. Pt required more assistance to exit bed, stand, and to take very few steps along EOB to recliner. Pt was educated on importance of there ex if he wants to eventually return home safely. He does state understanding however will need to be further encouraged in the future. Will require +2 assistance to stand form lower recliner surface. Author will return later to assist as requested. He will continue to benefit from skilled PT at DC to maximize independence and safety with all ADLs.    If plan is discharge home, recommend the following: A lot of help with walking and/or transfers;Direct supervision/assist for medications management;Direct supervision/assist for financial management;Assist for transportation;Help with stairs or ramp for entrance     Equipment Recommendations  None recommended by PT       Precautions / Restrictions Precautions Precautions: Fall Recall of Precautions/Restrictions: Intact Restrictions Weight Bearing Restrictions Per Provider Order: No     Mobility  Bed Mobility Overal bed mobility: Needs Assistance Bed Mobility: Supine to Sit  Supine to sit: Max assist, HOB elevated, Used rails  General bed mobility comments: Max assist + increased time to achieve EOB short sitting. pt sat EOB x several minutes prior to  standing    Transfers Overall transfer level: Needs assistance Equipment used: Rolling walker (2 wheels) (Bariatric) Transfers: Sit to/from Stand Sit to Stand: Max assist, From elevated surface  General transfer comment: Pt required more assistance today to achieve EOB standing to bariatric RW. Author encouraged pt to move more due to becoming more weak since he has not been OOB in several days. pt states understanding    Ambulation/Gait Ambulation/Gait assistance: Min assist, Mod assist Gait Distance (Feet): 5 Feet Assistive device: Rolling walker (2 wheels) (Bariatric) Gait Pattern/deviations: Step-to pattern, Trunk flexed Gait velocity: decreased  General Gait Details: heavy reliance on BUE support on RW however much more assistance required to safely go short distance. required + 1 assistance however will require +2 to stand and return to bed form recliner    Balance Overall balance assessment: Needs assistance Sitting-balance support: No upper extremity supported Sitting balance-Leahy Scale: Good Sitting balance - Comments: no LOB with feet support only whille seated EOB   Standing balance support: Bilateral upper extremity supported, During functional activity, Reliant on assistive device for balance Standing balance-Leahy Scale: Poor Standing balance comment: pt is at high risk of falls      Communication Communication Communication: No apparent difficulties  Cognition Arousal: Alert Behavior During Therapy: Flat affect   PT - Cognitive impairments: No apparent impairments, Awareness    PT - Cognition Comments: Pt is A and O  but flat affect. Increased time to response. Needs encourgement Following commands: Intact      Cueing Cueing Techniques: Verbal cues     General Comments General comments (skin integrity, edema, etc.): pt remains on 4L [place] o2 throughout session.  no desaturation when O2 is correctly in nastrals      Pertinent Vitals/Pain Pain  Assessment Pain Assessment: No/denies pain     PT Goals (current goals can now be found in the care plan section) Acute Rehab PT Goals Patient Stated Goal: none stated Progress towards PT goals: Not progressing toward goals - comment (self limiting)    Frequency    Min 2X/week       Co-evaluation     PT goals addressed during session: Balance;Mobility/safety with mobility;Proper use of DME;Strengthening/ROM        AM-PAC PT "6 Clicks" Mobility   Outcome Measure  Help needed turning from your back to your side while in a flat bed without using bedrails?: A Little Help needed moving from lying on your back to sitting on the side of a flat bed without using bedrails?: A Lot Help needed moving to and from a bed to a chair (including a wheelchair)?: A Lot Help needed standing up from a chair using your arms (e.g., wheelchair or bedside chair)?: A Lot Help needed to walk in hospital room?: A Lot Help needed climbing 3-5 steps with a railing? : Total 6 Click Score: 12    End of Session Equipment Utilized During Treatment: Oxygen Activity Tolerance: Patient limited by fatigue;Patient tolerated treatment well Patient left: in chair;with call bell/phone within reach;with nursing/sitter in room Nurse Communication: Mobility status PT Visit Diagnosis: Muscle weakness (generalized) (M62.81);Difficulty in walking, not elsewhere classified (R26.2)     Time: 4098-1191 PT Time Calculation (min) (ACUTE ONLY): 24 min  Charges:    $Therapeutic Activity: 23-37 mins PT General Charges $$ ACUTE PT VISIT: 1 Visit                    Jetta Lout PTA 04/27/23, 10:54 AM

## 2023-04-27 NOTE — ED Notes (Signed)
 This RN gave report to [facility] and performed care handoff. Call light in reach, bed wheels locked, side rail raised, pt updated on plan of care. Rounding completed.

## 2023-04-27 NOTE — ED Notes (Signed)
 Patient had bowel movement in bed, patient was cleaned, bed linens and gown changed, new chux applied.

## 2023-04-28 LAB — CBG MONITORING, ED
Glucose-Capillary: 146 mg/dL — ABNORMAL HIGH (ref 70–99)
Glucose-Capillary: 144 mg/dL — ABNORMAL HIGH (ref 70–99)

## 2023-04-28 NOTE — ED Notes (Signed)
 Pt ABCs intact. RR even and unlabored. Pt in NAD. Bed in lowest locked position. Call bell in reach. Denies needs at this time.

## 2023-04-28 NOTE — TOC Transition Note (Signed)
 Transition of Care [facility]) - Discharge Note   Patient Details  Name: Raymond [name] MRN: [id number] Date of Birth: 04/03/1965  Transition [facility] Care [facility]) CM/SW Contact:  Colin Broach, LCSW Phone Number: 04/28/2023, 5:04 PM   Clinical Narrative:   CSW tried contacting SNF's to inquire about possible bed offers.  VM left for [facility] in [place] and [place].  Aslo vm left for Angie at [facility].  CSW discussed with MD whether patient could discharge home.  CSW spoke with patient's gf, [name] to discuss discharge plans back home with home health resuming.  She stated that it would be fine and that she'd work on getting patient's sisters to support him.  CSW reached out to [place], at [place], and she stated that she could take patient back since he was already open with them.. Transport arranged by [facility]. CSW signing off.    Final next level of care: Home w Home Health Services Barriers to Discharge: No Barriers Identified   Patient Goals and CMS Choice            Discharge Placement                       Discharge Plan and Services Additional resources added to the After Visit Summary for                            [facility] Arranged: RN, PT   Date [facility] Agency Contacted: 04/28/23   Representative spoke with at [facility] Agency: Elnita Maxwell  Social Drivers of Health (SDOH) Interventions SDOH Screenings   Food Insecurity: No Food Insecurity (03/04/2023)  Housing: Low Risk  (03/04/2023)  Transportation Needs: No Transportation Needs (03/04/2023)  Utilities: Not At Risk (03/04/2023)  Tobacco Use: Low Risk  (04/12/2023)     Readmission Risk Interventions     No data to display

## 2023-04-28 NOTE — Discharge Instructions (Signed)
 Social work has reached out to your home health facility to do a home assessment.  If you do not hear back from them in the next 1 to 2 days, please give them a call.  Please also follow-up with your primary care doctor in 2 days to get reassessed.

## 2023-04-28 NOTE — ED Notes (Signed)
 Pt resting peacefully in bed at this time. Pt ABCs intact. RR even and unlabored. Pt in NAD. Bed in lowest locked position. Call bell in reach. Denies needs at this time.

## 2023-04-28 NOTE — ED Notes (Signed)
 During rounding of ot, RN saw that pts urine cannister was almost full at approximately 900 mls. This RN went to the supply room to grab a new one, and upon returning, found the pt to just have urinated in sleep, overfilling cannister and small puddle of urine at in between the pts legs. The cannister was quickly exchanged, towels placed to soak urine, and then pt was woken up to assist in rolling to remove soiled padding and have fresh padding placed on sheets. Pt ABCs intact. RR even and unlabored on CPAP. Pt in NAD. Bed in lowest locked position. Call bell in reach. Denies needs at this time.

## 2023-04-28 NOTE — ED Provider Notes (Signed)
.-----------------------------------------   3:23 PM on 04/28/2023 -----------------------------------------  Blood pressure 126/61, pulse 63, temperature 98 F (36.7 C), temperature source Axillary, resp. rate 15, height 6\' 2"  (1.88 m), weight (!) 182 kg, SpO2 96%.  Social work reached out, girlfriend is able to take care of him at home, she will work with his home health agency for them to go to a home assessment.  Social work is setting up transfer for him to return.  Patient was initially seen on 12 March for generalized weakness.  Was medically cleared by physician at that time after workup.  Will have him follow-up with his primary care doctor in 2 to 3 days for reassessment.  Discussed with patient about the plan and he is agreeable to go home with girlfriend and outpatient home health.  Otherwise he is safe for discharge.  Strict precautions given.         Claybon Jabs, MD 04/28/23 [phone]

## 2023-04-28 NOTE — Progress Notes (Signed)
 Occupational Therapy Treatment Patient Details Name: Raymond [name] MRN: [id number] DOB: April 15, 1965 Today's Date: 04/28/2023   History of present illness Patient is a 58 year old male with multiple admissions in recent months for CVA, STEMI, heart cath, acute on chronic hypoxic respiratory failure. History of morbid obesity, chronic  HFpEF, HTN, OSA on CPAP, CAD, T2DM   OT comments  Pt seen for OT treatment on this date. Upon arrival to room pt supine in bed, agreeable to tx. Pt requires set up for oral care and face washing at bed level. Pt refused OOB/EOB mobility on this date despite verbal encouragement. Pt required MAX A +3 for bed level pericare/sheet change, pt able to use UE to assist in rolling. VSS. On RA throughout session. Pt making slow progress toward goals, will continue to follow POC. Discharge recommendation remains appropriate.        If plan is discharge home, recommend the following:  Two people to help with walking and/or transfers;Two people to help with bathing/dressing/bathroom;Assist for transportation;Assistance with cooking/housework;Help with stairs or ramp for entrance   Equipment Recommendations  Other (comment)    Recommendations for Other Services      Precautions / Restrictions Precautions Precautions: Fall Recall of Precautions/Restrictions: Intact Restrictions Weight Bearing Restrictions Per Provider Order: No       Mobility Bed Mobility Overal bed mobility: Needs Assistance Bed Mobility: Rolling Rolling: Max assist, +2 for physical assistance, Used rails   Supine to sit:  (+3 total for rolling)     General bed mobility comments: MAX assist +3 for rolling during pericare and bed cleaning post BM at bed level    Transfers                   General transfer comment: NT pt refused OOB mobility on this date, dispite verbal encouragment     Balance Overall balance assessment: Needs assistance                              High Level Balance Comments: Will continue to assess on next avaiable date.           ADL either performed or assessed with clinical judgement   ADL Overall ADL's : Needs assistance/impaired Eating/Feeding: Set up;Bed level   Grooming: Wash/dry face;Bed level;Oral care                       Toileting- Clothing Manipulation and Hygiene: Maximal assistance;Bed level (+3)         General ADL Comments: Pt completed bed level oral care and face washing, pt refused OOB/EOB ADL dispite verbal encouragment    Extremity/Trunk Assessment              Vision       Perception     Praxis     Communication Communication Communication: No apparent difficulties   Cognition Arousal: Alert Behavior During Therapy: Flat affect Cognition: No family/caregiver present to determine baseline             OT - Cognition Comments: a/ox4                 Following commands: Intact        Cueing   Cueing Techniques: Verbal cues  Exercises      Shoulder Instructions       General Comments Small tear on buttocks, RN aware    Pertinent Vitals/ Pain  Pain Assessment Pain Assessment: No/denies pain  Home Living                                          Prior Functioning/Environment              Frequency  Min 2X/week        Progress Toward Goals  OT Goals(current goals can now be found in the care plan section)     Acute Rehab OT Goals Patient Stated Goal: go home OT Goal Formulation: With patient Time For Goal Achievement: 05/01/23 Potential to Achieve Goals: Good  Plan      Co-evaluation                 AM-PAC OT "6 Clicks" Daily Activity     Outcome Measure   Help from another person eating meals?: A Little Help from another person taking care of personal grooming?: A Lot Help from another person toileting, which includes using toliet, bedpan, or urinal?: Total Help from another person  bathing (including washing, rinsing, drying)?: A Lot Help from another person to put on and taking off regular upper body clothing?: A Lot Help from another person to put on and taking off regular lower body clothing?: A Lot 6 Click Score: 12    End of Session    OT Visit Diagnosis: Muscle weakness (generalized) (M62.81);Other abnormalities of gait and mobility (R26.89)   Activity Tolerance Patient tolerated treatment well   Patient Left in bed;with call bell/phone within reach   Nurse Communication Mobility status        Time: 1610-9604 OT Time Calculation (min): 29 min  Charges: OT General Charges $OT Visit: 1 Visit OT Treatments $Self Care/Home Management : 23-37 mins  Glenard Haring M.S. OTR/L  04/28/23, 4:52 PM

## 2023-04-28 NOTE — ED Provider Notes (Signed)
 Emergency Medicine Observation Re-evaluation Note  Physical Exam   BP (!) 122/52   Pulse 72   Temp 98.2 F (36.8 C) (Oral)   Resp 20   Ht 6\' 2"  (1.88 m)   Wt (!) 182 kg   SpO2 98%   BMI 51.52 kg/m   Patient appears in no acute distress.  ED Course / MDM   No reported events during my shift at the time of this note.   Pt is awaiting dispo from consultants   Pilar Jarvis MD    Pilar Jarvis, MD 04/28/23 [phone]

## 2023-05-01 ENCOUNTER — Ambulatory Visit: Admitting: Pulmonary Disease

## 2023-07-02 DEATH — deceased
